# Patient Record
Sex: Female | Born: 1955
Health system: Southern US, Community
[De-identification: ages and names within clinical notes are randomized; demographics above are authoritative.]

## PROBLEM LIST (undated history)

## (undated) DIAGNOSIS — C801 Malignant (primary) neoplasm, unspecified: Secondary | ICD-10-CM

## (undated) DIAGNOSIS — R600 Localized edema: Secondary | ICD-10-CM

## (undated) DIAGNOSIS — R51 Headache: Secondary | ICD-10-CM

## (undated) DIAGNOSIS — K635 Polyp of colon: Secondary | ICD-10-CM

## (undated) DIAGNOSIS — G2401 Drug induced subacute dyskinesia: Secondary | ICD-10-CM

## (undated) DIAGNOSIS — F419 Anxiety disorder, unspecified: Secondary | ICD-10-CM

## (undated) DIAGNOSIS — E785 Hyperlipidemia, unspecified: Secondary | ICD-10-CM

## (undated) DIAGNOSIS — T7840XA Allergy, unspecified, initial encounter: Secondary | ICD-10-CM

## (undated) DIAGNOSIS — M1990 Unspecified osteoarthritis, unspecified site: Secondary | ICD-10-CM

## (undated) DIAGNOSIS — K219 Gastro-esophageal reflux disease without esophagitis: Secondary | ICD-10-CM

## (undated) DIAGNOSIS — E669 Obesity, unspecified: Secondary | ICD-10-CM

## (undated) DIAGNOSIS — M810 Age-related osteoporosis without current pathological fracture: Secondary | ICD-10-CM

## (undated) DIAGNOSIS — R1310 Dysphagia, unspecified: Secondary | ICD-10-CM

## (undated) DIAGNOSIS — D649 Anemia, unspecified: Secondary | ICD-10-CM

## (undated) DIAGNOSIS — M2550 Pain in unspecified joint: Secondary | ICD-10-CM

## (undated) DIAGNOSIS — R040 Epistaxis: Secondary | ICD-10-CM

## (undated) DIAGNOSIS — G4730 Sleep apnea, unspecified: Secondary | ICD-10-CM

## (undated) DIAGNOSIS — F32A Depression, unspecified: Secondary | ICD-10-CM

## (undated) DIAGNOSIS — F988 Other specified behavioral and emotional disorders with onset usually occurring in childhood and adolescence: Secondary | ICD-10-CM

## (undated) DIAGNOSIS — N3010 Interstitial cystitis (chronic) without hematuria: Secondary | ICD-10-CM

## (undated) DIAGNOSIS — E739 Lactose intolerance, unspecified: Secondary | ICD-10-CM

## (undated) DIAGNOSIS — K222 Esophageal obstruction: Secondary | ICD-10-CM

## (undated) DIAGNOSIS — C3490 Malignant neoplasm of unspecified part of unspecified bronchus or lung: Secondary | ICD-10-CM

## (undated) DIAGNOSIS — G709 Myoneural disorder, unspecified: Secondary | ICD-10-CM

## (undated) DIAGNOSIS — E119 Type 2 diabetes mellitus without complications: Secondary | ICD-10-CM

## (undated) DIAGNOSIS — R002 Palpitations: Secondary | ICD-10-CM

## (undated) DIAGNOSIS — F319 Bipolar disorder, unspecified: Secondary | ICD-10-CM

## (undated) DIAGNOSIS — F329 Major depressive disorder, single episode, unspecified: Secondary | ICD-10-CM

## (undated) HISTORY — DX: Obesity, unspecified: E66.9

## (undated) HISTORY — DX: Localized edema: R60.0

## (undated) HISTORY — DX: Age-related osteoporosis without current pathological fracture: M81.0

## (undated) HISTORY — DX: Unspecified osteoarthritis, unspecified site: M19.90

## (undated) HISTORY — PX: TOTAL HIP ARTHROPLASTY: SHX124

## (undated) HISTORY — PX: OTHER SURGICAL HISTORY: SHX169

## (undated) HISTORY — DX: Esophageal obstruction: K22.2

## (undated) HISTORY — DX: Palpitations: R00.2

## (undated) HISTORY — DX: Anxiety disorder, unspecified: F41.9

## (undated) HISTORY — DX: Drug induced subacute dyskinesia: G24.01

## (undated) HISTORY — PX: COLONOSCOPY: SHX174

## (undated) HISTORY — DX: Pain in unspecified joint: M25.50

## (undated) HISTORY — DX: Myoneural disorder, unspecified: G70.9

## (undated) HISTORY — DX: Dysphagia, unspecified: R13.10

## (undated) HISTORY — DX: Other specified behavioral and emotional disorders with onset usually occurring in childhood and adolescence: F98.8

## (undated) HISTORY — DX: Interstitial cystitis (chronic) without hematuria: N30.10

## (undated) HISTORY — DX: Allergy, unspecified, initial encounter: T78.40XA

## (undated) HISTORY — PX: JOINT REPLACEMENT: SHX530

## (undated) HISTORY — PX: TUBAL LIGATION: SHX77

## (undated) HISTORY — DX: Lactose intolerance, unspecified: E73.9

## (undated) HISTORY — DX: Type 2 diabetes mellitus without complications: E11.9

---

## 1986-09-05 HISTORY — PX: OTHER SURGICAL HISTORY: SHX169

## 1988-09-05 HISTORY — PX: TUBAL LIGATION: SHX77

## 1998-01-28 ENCOUNTER — Ambulatory Visit: Admission: RE | Admit: 1998-01-28 | Discharge: 1998-01-28 | Payer: Self-pay | Admitting: Family Medicine

## 1998-08-26 ENCOUNTER — Encounter: Payer: Self-pay | Admitting: Gynecology

## 1998-08-26 ENCOUNTER — Ambulatory Visit (HOSPITAL_COMMUNITY): Admission: RE | Admit: 1998-08-26 | Discharge: 1998-08-26 | Payer: Self-pay | Admitting: Gynecology

## 1999-07-08 ENCOUNTER — Encounter: Payer: Self-pay | Admitting: Family Medicine

## 1999-07-08 ENCOUNTER — Ambulatory Visit (HOSPITAL_COMMUNITY): Admission: RE | Admit: 1999-07-08 | Discharge: 1999-07-08 | Payer: Self-pay | Admitting: Family Medicine

## 2000-07-04 ENCOUNTER — Emergency Department (HOSPITAL_COMMUNITY): Admission: EM | Admit: 2000-07-04 | Discharge: 2000-07-04 | Payer: Self-pay | Admitting: Emergency Medicine

## 2000-09-05 DIAGNOSIS — C3490 Malignant neoplasm of unspecified part of unspecified bronchus or lung: Secondary | ICD-10-CM

## 2000-09-05 HISTORY — DX: Malignant neoplasm of unspecified part of unspecified bronchus or lung: C34.90

## 2000-09-05 HISTORY — PX: WEDGE RESECTION: SHX5070

## 2000-09-12 ENCOUNTER — Other Ambulatory Visit: Admission: RE | Admit: 2000-09-12 | Discharge: 2000-09-12 | Payer: Self-pay | Admitting: Gynecology

## 2000-09-25 ENCOUNTER — Ambulatory Visit (HOSPITAL_COMMUNITY): Admission: RE | Admit: 2000-09-25 | Discharge: 2000-09-25 | Payer: Self-pay | Admitting: Gynecology

## 2000-09-25 ENCOUNTER — Encounter: Payer: Self-pay | Admitting: Gynecology

## 2001-05-09 ENCOUNTER — Encounter (INDEPENDENT_AMBULATORY_CARE_PROVIDER_SITE_OTHER): Payer: Self-pay | Admitting: *Deleted

## 2001-05-09 ENCOUNTER — Encounter: Admission: RE | Admit: 2001-05-09 | Discharge: 2001-05-09 | Payer: Self-pay | Admitting: *Deleted

## 2001-05-09 ENCOUNTER — Encounter: Payer: Self-pay | Admitting: *Deleted

## 2001-05-16 ENCOUNTER — Encounter (INDEPENDENT_AMBULATORY_CARE_PROVIDER_SITE_OTHER): Payer: Self-pay | Admitting: *Deleted

## 2001-05-16 ENCOUNTER — Encounter: Admission: RE | Admit: 2001-05-16 | Discharge: 2001-05-16 | Payer: Self-pay | Admitting: *Deleted

## 2001-05-16 ENCOUNTER — Encounter: Payer: Self-pay | Admitting: *Deleted

## 2001-05-22 ENCOUNTER — Ambulatory Visit (HOSPITAL_COMMUNITY): Admission: RE | Admit: 2001-05-22 | Discharge: 2001-05-22 | Payer: Self-pay | Admitting: *Deleted

## 2001-05-22 ENCOUNTER — Encounter: Payer: Self-pay | Admitting: *Deleted

## 2001-06-11 ENCOUNTER — Encounter (INDEPENDENT_AMBULATORY_CARE_PROVIDER_SITE_OTHER): Payer: Self-pay | Admitting: *Deleted

## 2001-06-11 ENCOUNTER — Encounter: Payer: Self-pay | Admitting: Thoracic Surgery

## 2001-06-11 ENCOUNTER — Inpatient Hospital Stay (HOSPITAL_COMMUNITY): Admission: RE | Admit: 2001-06-11 | Discharge: 2001-06-19 | Payer: Self-pay | Admitting: Thoracic Surgery

## 2001-06-12 ENCOUNTER — Encounter: Payer: Self-pay | Admitting: Thoracic Surgery

## 2001-06-13 ENCOUNTER — Encounter: Payer: Self-pay | Admitting: Thoracic Surgery

## 2001-06-14 ENCOUNTER — Encounter: Payer: Self-pay | Admitting: Thoracic Surgery

## 2001-06-15 ENCOUNTER — Encounter: Payer: Self-pay | Admitting: Thoracic Surgery

## 2001-06-16 ENCOUNTER — Encounter: Payer: Self-pay | Admitting: Thoracic Surgery

## 2001-06-18 ENCOUNTER — Encounter: Payer: Self-pay | Admitting: Thoracic Surgery

## 2001-06-22 ENCOUNTER — Encounter: Payer: Self-pay | Admitting: Thoracic Surgery

## 2001-06-22 ENCOUNTER — Encounter: Admission: RE | Admit: 2001-06-22 | Discharge: 2001-06-22 | Payer: Self-pay | Admitting: Thoracic Surgery

## 2001-07-04 ENCOUNTER — Encounter: Payer: Self-pay | Admitting: Thoracic Surgery

## 2001-07-04 ENCOUNTER — Encounter: Admission: RE | Admit: 2001-07-04 | Discharge: 2001-07-04 | Payer: Self-pay | Admitting: Thoracic Surgery

## 2001-07-25 ENCOUNTER — Encounter: Admission: RE | Admit: 2001-07-25 | Discharge: 2001-07-25 | Payer: Self-pay | Admitting: Thoracic Surgery

## 2001-07-25 ENCOUNTER — Encounter: Payer: Self-pay | Admitting: Thoracic Surgery

## 2001-08-06 ENCOUNTER — Encounter: Payer: Self-pay | Admitting: Internal Medicine

## 2001-08-16 ENCOUNTER — Ambulatory Visit (HOSPITAL_COMMUNITY): Admission: RE | Admit: 2001-08-16 | Discharge: 2001-08-16 | Payer: Self-pay | Admitting: Internal Medicine

## 2001-08-16 ENCOUNTER — Encounter: Payer: Self-pay | Admitting: Internal Medicine

## 2001-08-16 ENCOUNTER — Encounter (INDEPENDENT_AMBULATORY_CARE_PROVIDER_SITE_OTHER): Payer: Self-pay | Admitting: *Deleted

## 2001-08-22 ENCOUNTER — Encounter: Payer: Self-pay | Admitting: Thoracic Surgery

## 2001-08-22 ENCOUNTER — Encounter: Admission: RE | Admit: 2001-08-22 | Discharge: 2001-08-22 | Payer: Self-pay | Admitting: Thoracic Surgery

## 2001-09-21 ENCOUNTER — Encounter: Admission: RE | Admit: 2001-09-21 | Discharge: 2001-12-20 | Payer: Self-pay | Admitting: *Deleted

## 2001-09-26 ENCOUNTER — Ambulatory Visit (HOSPITAL_COMMUNITY): Admission: RE | Admit: 2001-09-26 | Discharge: 2001-09-26 | Payer: Self-pay | Admitting: *Deleted

## 2001-09-26 ENCOUNTER — Encounter: Payer: Self-pay | Admitting: *Deleted

## 2001-10-19 ENCOUNTER — Encounter: Payer: Self-pay | Admitting: Thoracic Surgery

## 2001-10-19 ENCOUNTER — Other Ambulatory Visit: Admission: RE | Admit: 2001-10-19 | Discharge: 2001-10-19 | Payer: Self-pay | Admitting: Gynecology

## 2001-10-19 ENCOUNTER — Encounter: Admission: RE | Admit: 2001-10-19 | Discharge: 2001-10-19 | Payer: Self-pay | Admitting: Thoracic Surgery

## 2001-12-31 ENCOUNTER — Encounter: Admission: RE | Admit: 2001-12-31 | Discharge: 2002-03-31 | Payer: Self-pay | Admitting: *Deleted

## 2002-01-22 ENCOUNTER — Encounter (INDEPENDENT_AMBULATORY_CARE_PROVIDER_SITE_OTHER): Payer: Self-pay | Admitting: *Deleted

## 2002-01-22 ENCOUNTER — Encounter: Payer: Self-pay | Admitting: Thoracic Surgery

## 2002-01-22 ENCOUNTER — Encounter: Admission: RE | Admit: 2002-01-22 | Discharge: 2002-01-22 | Payer: Self-pay | Admitting: Thoracic Surgery

## 2002-02-11 ENCOUNTER — Encounter: Payer: Self-pay | Admitting: Oncology

## 2002-02-11 ENCOUNTER — Encounter (INDEPENDENT_AMBULATORY_CARE_PROVIDER_SITE_OTHER): Payer: Self-pay | Admitting: *Deleted

## 2002-02-11 ENCOUNTER — Encounter: Admission: RE | Admit: 2002-02-11 | Discharge: 2002-02-11 | Payer: Self-pay | Admitting: Oncology

## 2002-04-17 ENCOUNTER — Encounter: Payer: Self-pay | Admitting: Thoracic Surgery

## 2002-04-17 ENCOUNTER — Encounter: Admission: RE | Admit: 2002-04-17 | Discharge: 2002-04-17 | Payer: Self-pay | Admitting: Thoracic Surgery

## 2002-05-16 ENCOUNTER — Encounter: Admission: RE | Admit: 2002-05-16 | Discharge: 2002-06-18 | Payer: Self-pay | Admitting: *Deleted

## 2002-06-17 ENCOUNTER — Encounter: Payer: Self-pay | Admitting: Internal Medicine

## 2002-07-11 ENCOUNTER — Encounter: Admission: RE | Admit: 2002-07-11 | Discharge: 2002-10-09 | Payer: Self-pay | Admitting: *Deleted

## 2002-07-23 ENCOUNTER — Encounter: Payer: Self-pay | Admitting: Thoracic Surgery

## 2002-07-23 ENCOUNTER — Encounter (INDEPENDENT_AMBULATORY_CARE_PROVIDER_SITE_OTHER): Payer: Self-pay | Admitting: *Deleted

## 2002-07-23 ENCOUNTER — Encounter: Admission: RE | Admit: 2002-07-23 | Discharge: 2002-07-23 | Payer: Self-pay | Admitting: Thoracic Surgery

## 2002-09-27 ENCOUNTER — Ambulatory Visit (HOSPITAL_COMMUNITY): Admission: RE | Admit: 2002-09-27 | Discharge: 2002-09-27 | Payer: Self-pay | Admitting: Gynecology

## 2002-09-27 ENCOUNTER — Encounter: Payer: Self-pay | Admitting: Gynecology

## 2002-11-08 ENCOUNTER — Other Ambulatory Visit: Admission: RE | Admit: 2002-11-08 | Discharge: 2002-11-08 | Payer: Self-pay | Admitting: Gynecology

## 2002-11-14 ENCOUNTER — Encounter: Admission: RE | Admit: 2002-11-14 | Discharge: 2003-02-12 | Payer: Self-pay | Admitting: *Deleted

## 2003-01-21 ENCOUNTER — Encounter: Payer: Self-pay | Admitting: Thoracic Surgery

## 2003-01-21 ENCOUNTER — Encounter: Admission: RE | Admit: 2003-01-21 | Discharge: 2003-01-21 | Payer: Self-pay | Admitting: Thoracic Surgery

## 2003-01-21 ENCOUNTER — Encounter (INDEPENDENT_AMBULATORY_CARE_PROVIDER_SITE_OTHER): Payer: Self-pay | Admitting: *Deleted

## 2003-07-24 ENCOUNTER — Encounter: Admission: RE | Admit: 2003-07-24 | Discharge: 2003-07-24 | Payer: Self-pay | Admitting: Thoracic Surgery

## 2003-08-25 ENCOUNTER — Encounter: Admission: RE | Admit: 2003-08-25 | Discharge: 2003-08-25 | Payer: Self-pay | Admitting: Family Medicine

## 2003-09-23 ENCOUNTER — Encounter: Payer: Self-pay | Admitting: Internal Medicine

## 2003-09-30 ENCOUNTER — Ambulatory Visit (HOSPITAL_COMMUNITY): Admission: RE | Admit: 2003-09-30 | Discharge: 2003-09-30 | Payer: Self-pay | Admitting: Oncology

## 2003-11-10 ENCOUNTER — Other Ambulatory Visit: Admission: RE | Admit: 2003-11-10 | Discharge: 2003-11-10 | Payer: Self-pay | Admitting: Gynecology

## 2003-11-20 ENCOUNTER — Encounter: Admission: RE | Admit: 2003-11-20 | Discharge: 2003-11-20 | Payer: Self-pay | Admitting: Oncology

## 2003-12-25 ENCOUNTER — Encounter: Admission: RE | Admit: 2003-12-25 | Discharge: 2003-12-25 | Payer: Self-pay | Admitting: Family Medicine

## 2004-04-01 ENCOUNTER — Ambulatory Visit (HOSPITAL_COMMUNITY): Admission: RE | Admit: 2004-04-01 | Discharge: 2004-04-01 | Payer: Self-pay | Admitting: Oncology

## 2004-04-28 ENCOUNTER — Encounter (INDEPENDENT_AMBULATORY_CARE_PROVIDER_SITE_OTHER): Payer: Self-pay | Admitting: *Deleted

## 2004-04-28 ENCOUNTER — Encounter: Admission: RE | Admit: 2004-04-28 | Discharge: 2004-04-28 | Payer: Self-pay | Admitting: Thoracic Surgery

## 2004-07-22 ENCOUNTER — Ambulatory Visit: Payer: Self-pay | Admitting: Oncology

## 2004-08-26 ENCOUNTER — Ambulatory Visit: Payer: Self-pay | Admitting: Internal Medicine

## 2004-09-01 ENCOUNTER — Encounter: Admission: RE | Admit: 2004-09-01 | Discharge: 2004-09-01 | Payer: Self-pay | Admitting: Oncology

## 2004-09-01 ENCOUNTER — Encounter (INDEPENDENT_AMBULATORY_CARE_PROVIDER_SITE_OTHER): Payer: Self-pay | Admitting: *Deleted

## 2004-12-03 ENCOUNTER — Encounter: Admission: RE | Admit: 2004-12-03 | Discharge: 2004-12-03 | Payer: Self-pay | Admitting: Family Medicine

## 2005-02-17 ENCOUNTER — Ambulatory Visit: Payer: Self-pay | Admitting: Orthopedic Surgery

## 2005-02-18 ENCOUNTER — Ambulatory Visit: Payer: Self-pay | Admitting: Oncology

## 2005-02-22 ENCOUNTER — Ambulatory Visit (HOSPITAL_COMMUNITY): Admission: RE | Admit: 2005-02-22 | Discharge: 2005-02-22 | Payer: Self-pay | Admitting: Orthopedic Surgery

## 2005-02-28 ENCOUNTER — Ambulatory Visit (HOSPITAL_COMMUNITY): Admission: RE | Admit: 2005-02-28 | Discharge: 2005-02-28 | Payer: Self-pay | Admitting: Gynecology

## 2005-03-02 ENCOUNTER — Ambulatory Visit: Payer: Self-pay | Admitting: Orthopedic Surgery

## 2005-03-09 ENCOUNTER — Ambulatory Visit (HOSPITAL_COMMUNITY): Admission: RE | Admit: 2005-03-09 | Discharge: 2005-03-09 | Payer: Self-pay | Admitting: Orthopedic Surgery

## 2005-03-10 ENCOUNTER — Other Ambulatory Visit: Admission: RE | Admit: 2005-03-10 | Discharge: 2005-03-10 | Payer: Self-pay | Admitting: Gynecology

## 2005-03-14 ENCOUNTER — Ambulatory Visit: Payer: Self-pay | Admitting: Orthopedic Surgery

## 2005-03-17 ENCOUNTER — Ambulatory Visit (HOSPITAL_COMMUNITY): Admission: RE | Admit: 2005-03-17 | Discharge: 2005-03-17 | Payer: Self-pay | Admitting: Orthopaedic Surgery

## 2005-03-23 ENCOUNTER — Ambulatory Visit: Payer: Self-pay | Admitting: Orthopedic Surgery

## 2005-04-01 ENCOUNTER — Encounter: Admission: RE | Admit: 2005-04-01 | Discharge: 2005-04-01 | Payer: Self-pay | Admitting: Orthopedic Surgery

## 2005-04-14 ENCOUNTER — Encounter: Admission: RE | Admit: 2005-04-14 | Discharge: 2005-04-14 | Payer: Self-pay | Admitting: Orthopedic Surgery

## 2005-05-25 ENCOUNTER — Encounter: Admission: RE | Admit: 2005-05-25 | Discharge: 2005-05-25 | Payer: Self-pay | Admitting: Thoracic Surgery

## 2005-06-15 ENCOUNTER — Inpatient Hospital Stay (HOSPITAL_COMMUNITY): Admission: RE | Admit: 2005-06-15 | Discharge: 2005-06-20 | Payer: Self-pay | Admitting: Orthopedic Surgery

## 2005-06-15 ENCOUNTER — Ambulatory Visit: Payer: Self-pay | Admitting: Physical Medicine & Rehabilitation

## 2005-07-30 ENCOUNTER — Encounter (INDEPENDENT_AMBULATORY_CARE_PROVIDER_SITE_OTHER): Payer: Self-pay | Admitting: *Deleted

## 2005-08-25 ENCOUNTER — Ambulatory Visit: Payer: Self-pay | Admitting: Oncology

## 2005-10-28 ENCOUNTER — Encounter: Admission: RE | Admit: 2005-10-28 | Discharge: 2005-10-28 | Payer: Self-pay | Admitting: Orthopedic Surgery

## 2005-11-23 ENCOUNTER — Encounter (INDEPENDENT_AMBULATORY_CARE_PROVIDER_SITE_OTHER): Payer: Self-pay | Admitting: *Deleted

## 2005-11-23 ENCOUNTER — Encounter: Admission: RE | Admit: 2005-11-23 | Discharge: 2005-11-23 | Payer: Self-pay | Admitting: Thoracic Surgery

## 2006-03-13 ENCOUNTER — Ambulatory Visit (HOSPITAL_COMMUNITY): Admission: RE | Admit: 2006-03-13 | Discharge: 2006-03-13 | Payer: Self-pay | Admitting: Gynecology

## 2006-03-17 ENCOUNTER — Encounter: Admission: RE | Admit: 2006-03-17 | Discharge: 2006-03-17 | Payer: Self-pay | Admitting: Orthopedic Surgery

## 2006-03-20 ENCOUNTER — Other Ambulatory Visit: Admission: RE | Admit: 2006-03-20 | Discharge: 2006-03-20 | Payer: Self-pay | Admitting: Gynecology

## 2006-07-17 ENCOUNTER — Inpatient Hospital Stay (HOSPITAL_COMMUNITY): Admission: RE | Admit: 2006-07-17 | Discharge: 2006-07-20 | Payer: Self-pay | Admitting: Orthopedic Surgery

## 2006-07-20 ENCOUNTER — Encounter (INDEPENDENT_AMBULATORY_CARE_PROVIDER_SITE_OTHER): Payer: Self-pay | Admitting: *Deleted

## 2007-09-24 ENCOUNTER — Ambulatory Visit (HOSPITAL_COMMUNITY): Admission: RE | Admit: 2007-09-24 | Discharge: 2007-09-24 | Payer: Self-pay | Admitting: Oncology

## 2007-09-24 ENCOUNTER — Ambulatory Visit: Payer: Self-pay | Admitting: Internal Medicine

## 2007-11-06 ENCOUNTER — Other Ambulatory Visit: Admission: RE | Admit: 2007-11-06 | Discharge: 2007-11-06 | Payer: Self-pay | Admitting: Obstetrics and Gynecology

## 2007-12-19 ENCOUNTER — Ambulatory Visit: Payer: Self-pay | Admitting: Internal Medicine

## 2007-12-28 ENCOUNTER — Ambulatory Visit: Payer: Self-pay | Admitting: Internal Medicine

## 2008-11-05 ENCOUNTER — Ambulatory Visit (HOSPITAL_COMMUNITY): Admission: RE | Admit: 2008-11-05 | Discharge: 2008-11-05 | Payer: Self-pay | Admitting: Obstetrics and Gynecology

## 2009-08-26 ENCOUNTER — Other Ambulatory Visit: Admission: RE | Admit: 2009-08-26 | Discharge: 2009-08-26 | Payer: Self-pay | Admitting: Obstetrics and Gynecology

## 2009-09-05 HISTORY — PX: BUNIONECTOMY: SHX129

## 2010-05-26 ENCOUNTER — Ambulatory Visit (HOSPITAL_COMMUNITY): Admission: RE | Admit: 2010-05-26 | Discharge: 2010-05-26 | Payer: Self-pay | Admitting: Orthopedic Surgery

## 2010-07-06 ENCOUNTER — Ambulatory Visit (HOSPITAL_COMMUNITY): Admission: RE | Admit: 2010-07-06 | Discharge: 2010-07-06 | Payer: Self-pay | Admitting: Internal Medicine

## 2010-07-22 ENCOUNTER — Encounter: Payer: Self-pay | Admitting: Internal Medicine

## 2010-08-23 ENCOUNTER — Emergency Department (HOSPITAL_COMMUNITY)
Admission: EM | Admit: 2010-08-23 | Discharge: 2010-08-23 | Payer: Self-pay | Source: Home / Self Care | Admitting: Emergency Medicine

## 2010-08-26 ENCOUNTER — Other Ambulatory Visit
Admission: RE | Admit: 2010-08-26 | Discharge: 2010-08-26 | Payer: Self-pay | Source: Home / Self Care | Admitting: Obstetrics and Gynecology

## 2010-09-14 ENCOUNTER — Ambulatory Visit
Admission: RE | Admit: 2010-09-14 | Discharge: 2010-09-14 | Payer: Self-pay | Source: Home / Self Care | Attending: Internal Medicine | Admitting: Internal Medicine

## 2010-09-14 ENCOUNTER — Encounter: Payer: Self-pay | Admitting: Internal Medicine

## 2010-09-14 DIAGNOSIS — K219 Gastro-esophageal reflux disease without esophagitis: Secondary | ICD-10-CM | POA: Insufficient documentation

## 2010-09-14 DIAGNOSIS — D509 Iron deficiency anemia, unspecified: Secondary | ICD-10-CM | POA: Insufficient documentation

## 2010-09-14 DIAGNOSIS — Z860100 Personal history of colon polyps, unspecified: Secondary | ICD-10-CM | POA: Insufficient documentation

## 2010-09-25 ENCOUNTER — Encounter: Payer: Self-pay | Admitting: Internal Medicine

## 2010-09-25 ENCOUNTER — Encounter: Payer: Self-pay | Admitting: Oncology

## 2010-09-26 ENCOUNTER — Encounter: Payer: Self-pay | Admitting: Thoracic Surgery

## 2010-09-26 ENCOUNTER — Encounter: Payer: Self-pay | Admitting: Orthopedic Surgery

## 2010-09-26 ENCOUNTER — Encounter: Payer: Self-pay | Admitting: Family Medicine

## 2010-10-04 ENCOUNTER — Telehealth: Payer: Self-pay | Admitting: Internal Medicine

## 2010-10-05 ENCOUNTER — Other Ambulatory Visit: Payer: Self-pay | Admitting: Internal Medicine

## 2010-10-05 ENCOUNTER — Ambulatory Visit
Admission: RE | Admit: 2010-10-05 | Discharge: 2010-10-05 | Payer: Self-pay | Source: Home / Self Care | Attending: Internal Medicine | Admitting: Internal Medicine

## 2010-10-05 NOTE — Letter (Signed)
Summary: New Patient letter  [facility]  [address] [place], [place] [address]   Phone: [phone]  Fax: [phone]       07/22/2010 MRN: [id number]  Megan Whitney [address] [place], [place]  [address]  Dear Ms. Megan Whitney,  Welcome to the Gastroenterology Division at [facility].    You are scheduled to see Dr.  Marina Goodell on 09-14-10 at 9:15am on the 3rd floor at [facility], [address]  We ask that you try to arrive at our office 15 minutes prior to your appointment time to allow for check-in.  We would like you to complete the enclosed self-administered evaluation form prior to your visit and bring it with you on the day of your appointment.  We will review it with you.  Also, please bring a complete list of all your medications or, if you prefer, bring the medication bottles and we will list them.  Please bring your insurance card so that we may make a copy of it.  If your insurance requires a referral to see a specialist, please bring your referral form from your primary care physician.  Co-payments are due at the time of your visit and may be paid by cash, check or credit card.     Your office visit will consist of a consult with your physician (includes a physical exam), any laboratory testing he/she may order, scheduling of any necessary diagnostic testing (e.g. x-ray, ultrasound, CT-scan), and scheduling of a procedure (e.g. Endoscopy, Colonoscopy) if required.  Please allow enough time on your schedule to allow for any/all of these possibilities.    If you cannot keep your appointment, please call [phone] to cancel or reschedule prior to your appointment date.  This allows [place] the opportunity to schedule an appointment for another patient in need of care.  If you do not cancel or reschedule by 5 p.m. the business day prior to your appointment date, you will be charged a $50.00 late cancellation/no-show fee.    Thank you for choosing  [facility] for your medical needs.  We appreciate the opportunity to care for you.  Please visit [place] at our website  to learn more about our practice.                     Sincerely,                                                             The Gastroenterology Division

## 2010-10-07 NOTE — Discharge Summary (Signed)
Summary: Discharge Summary   NAME:  Megan Whitney, Megan Whitney              ACCOUNT NO.:  [id number]   MEDICAL RECORD NO.:  [id number]          PATIENT TYPE:  INP   LOCATION:  1621                         FACILITY:  [facility]   PHYSICIAN:  Ollen Gross, M.D.    DATE OF BIRTH:  19-Jul-1956   DATE OF ADMISSION:  06/15/2005  DATE OF DISCHARGE:  06/20/2005                                 DISCHARGE SUMMARY   ADMITTING DIAGNOSES:  1.  Osteoarthritis left hip.  2.  History of migraines.  3.  Bipolar disorder with history of depression.  4.  Hiatal hernia.  5.  Reflux disease.  6.  History of urinary tract infection.  7.  History of cystitis.  8.  Urinary incontinence.  9.  Past history of hypotensive episode from previous lung surgery.  10. History of lung carcinoma.  11. Degenerative disk disease.  12. Fibromyalgia.   DISCHARGE DIAGNOSES:  1.  Osteoarthritis left hip status post left total hip arthroplasty.  2.  Mild postoperative hyponatremia, improving.  3.  History of migraines.  4.  Bipolar disorder with history of depression.  5.  Hiatal hernia.  6.  Reflux disease.  7.  History of urinary tract infection.  8.  History of cystitis.  9.  Urinary incontinence.  10. Past history of hypotensive episode from previous lung surgery.  11. History of lung carcinoma.  12. Degenerative disk disease.  13. Fibromyalgia.   PROCEDURE:  June 15, 2005 left total hip.   SURGEON:  Dr. Lequita Halt.   ASSISTANT:  Perkins, P.A.-C.   ANESTHESIA:  General.   ESTIMATED BLOOD LOSS:  200 mL.   DRAINS:  Hemovac drains x1.   CONSULTATIONS:  Rehab Services.   BRIEF HISTORY:  Megan Whitney is a 55 year old female with end-stage arthritis to  the left hip with some dysplasia.  She has bone-on-bone changes with  intractable pain.  Due to significant findings, she was admitted for total  hip.   LABORATORY DATA:  Preop CBC hemoglobin 14.3, hematocrit 41.7, differential  within normal limits.  Postop hemoglobin 11.8,  last known H&H 11.6 and 33.4,  PT/PTT preop12.3 and 26 respectively.  Serial pro time followup last known  PT/INR 20.6 and 1.7.  Chem panel on admission all within normal limits.  Serial BMETs are followed  -- sodium dropped from 140 to 131, back up to  132, glucose went up from 92 to 144.  Preoperative UA -- negative.  Blood  group type A positive.  Preop EKG June 06, 2005 normal sinus rhythm,  normal EKG, confirmed by Dr. Molly Maduro Severe.  Left hip film June 06, 2005 -  - advanced degenerative changes involving the left hip.  Hip and pelvis film  on June 15, 2005 -- left total hip arthroplasty without complications.   HOSPITAL COURSE:  The patient was admitted to [facility] and  tolerated the procedure well.  Later transferred from the recovery room to  the orthopedic floor.  Started on PC and p.r.n. analgesics.  Rehab consult  was ordered postop.  It was felt by rehab that possibly could progress well,  but they would monitor her postoperatively, Hemovac drain was pulled on day  one.  Day two she had a little bit of discomfort with transfers, but getting  better.  She was placed on 25-50% partial weight-bearing.  Pulse was  elevated postop and felt to be due to pain, but if it continued to be  elevated, would work it up with EKG.  She was doing a little bit better by  the following day with her pain control.  By day three she was progressing a  little better with her physical therapy, up ambulating approximately 60  feet.  By day four she was progressing well with her therapy and wanted to  go home.  Discharge Planning started making home arrangements.  By June 20, 2005 she was doing well, dressings had been changed daily starting on  day two, incision looked good, no signs of infection, tolerating her meds  and was discharged home on June 20, 2005.   DISCHARGE PLAN:  The patient discharged home on June 20, 2005.   DISCHARGE DIAGNOSES:  Please see above.    DISCHARGE MEDICATIONS:  1.  Vicodin.  2.  Coumadin.  3.  Robaxin.   DIET:  Resume previous home diet.   ACTIVITY:  Partial weight-bearing 25-50% left lower extremity.  Gait  training ambulation, ADLs as per home health, physical therapy, home health  nursing.  Hip precautions -- total hip protocol.   FOLLOWUP:  Follow-up two weeks from surgery.   DISPOSITION:  Home.   CONDITION ON DISCHARGE:  Improved.      Alexzandrew L. Julien Girt, P.A.      Ollen Gross, M.D.  Electronically Signed    ALP/MEDQ  D:  07/30/2005  T:  07/30/2005  Job:  [id number]   cc:   Ollen Gross, M.D.  Fax: [phone]   Ellwood Dense, M.D.  Fax: [phone]

## 2010-10-07 NOTE — Progress Notes (Signed)
Summary: [facility]   Imported By: Sherian Rein 09/15/2010 10:50:37  _____________________________________________________________________  External Attachment:    Type:   Image     Comment:   External Document

## 2010-10-07 NOTE — Letter (Signed)
Summary: [facility] Instructions  [facility]  [address] [place], [place] [address]   Phone: [phone]  Fax: [phone]       Megan Whitney    1955-10-03    MRN: [id number]        Procedure Day /Date:TUESDAY, 10/05/10     Arrival Time:1:00 PM      Procedure Time:2:00 PM     Location of Procedure:                    X  [facility] (4th Floor)   PREPARATION FOR COLONOSCOPY WITH MOVIPREP/ENDO   Starting 5 days prior to your procedure 09/30/10 do not eat nuts, seeds, popcorn, corn, beans, peas,  salads, or any raw vegetables.  Do not take any fiber supplements (e.g. Metamucil, Citrucel, and Benefiber).  THE DAY BEFORE YOUR PROCEDURE         DATE: 10/04/10  DAY: MONDAY  1.  Drink clear liquids the entire day-NO SOLID FOOD  2.  Do not drink anything colored red or purple.  Avoid juices with pulp.  No orange juice.  3.  Drink at least 64 oz. (8 glasses) of fluid/clear liquids during the day to prevent dehydration and help the prep work efficiently.  CLEAR LIQUIDS INCLUDE: Water Jello Ice Popsicles Tea (sugar ok, no milk/cream) Powdered fruit flavored drinks Coffee (sugar ok, no milk/cream) Gatorade Juice: apple, white grape, white cranberry  Lemonade Clear bullion, consomm, broth Carbonated beverages (any kind) Strained chicken noodle soup Hard Candy                             4.  In the morning, mix first dose of MoviPrep solution:    Empty 1 Pouch A and 1 Pouch B into the disposable container    Add lukewarm drinking water to the top line of the container. Mix to dissolve    Refrigerate (mixed solution should be used within 24 hrs)  5.  Begin drinking the prep at 5:00 p.m. The MoviPrep container is divided by 4 marks.   Every 15 minutes drink the solution down to the next mark (approximately 8 oz) until the full liter is complete.   6.  Follow completed prep with 16 oz of clear liquid of your choice (Nothing red or purple).   Continue to drink clear liquids until bedtime.  7.  Before going to bed, mix second dose of MoviPrep solution:    Empty 1 Pouch A and 1 Pouch B into the disposable container    Add lukewarm drinking water to the top line of the container. Mix to dissolve    Refrigerate  THE DAY OF YOUR PROCEDURE      DATE:10/05/10 NUU:VOZDGUY  Beginning at 9:00 a.m. (5 hours before procedure):         1. Every 15 minutes, drink the solution down to the next mark (approx 8 oz) until the full liter is complete.  2. Follow completed prep with 16 oz. of clear liquid of your choice.    3. You may drink clear liquids until 12 NOON (2 HOURS BEFORE PROCEDURE).   MEDICATION INSTRUCTIONS  Unless otherwise instructed, you should take regular prescription medications with a small sip of water   as early as possible the morning of your procedure.  STOP YOUR IRON 7 DAYS PRIOR TO PROCEDURE/ 09/28/10       OTHER INSTRUCTIONS  You will need a responsible  adult at least 55 years of age to accompany you and drive you home.   This person must remain in the waiting room during your procedure.  Wear loose fitting clothing that is easily removed.  Leave jewelry and other valuables at home.  However, you may wish to bring a book to read or  an iPod/MP3 player to listen to music as you wait for your procedure to start.  Remove all body piercing jewelry and leave at home.  Total time from sign-in until discharge is approximately 2-3 hours.  You should go home directly after your procedure and rest.  You can resume normal activities the  day after your procedure.  The day of your procedure you should not:   Drive   Make legal decisions   Operate machinery   Drink alcohol   Return to work  You will receive specific instructions about eating, activities and medications before you leave.    The above instructions have been reviewed and explained to me by   _______________________    I fully  understand and can verbalize these instructions _____________________________ Date _________

## 2010-10-07 NOTE — Procedures (Signed)
Summary: EGD   EGD  Procedure date:  08/16/2001  Findings:      Location: [facility]  Findings: Stricture:  GERD  Patient Name: Megan Whitney, Megan Whitney. MRN: [id number] Procedure Procedures: Panendoscopy (EGD) CPT: 43235.  Personnel: Endoscopist: Wilhemina Bonito. Marina Goodell, MD.  Referred By: Mardelle Matte, MD.  Exam Location: Exam performed in Endoscopy Suite.  Patient Consent: Procedure, Alternatives, Risks and Benefits discussed, consent obtained,  Indications Symptoms: Dysphagia. Reflux symptoms  History  Pre-Exam Physical: Performed Aug 16, 2001  Cardio-pulmonary exam, HEENT exam, Abdominal exam, Extremity exam, Neurological exam, Mental status exam WNL.  Exam Exam Info: Maximum depth of insertion Duodenum, intended Duodenum. Patient position: on left side. Vocal cords visualized. Gastric retroflexion performed. Images taken. ASA Classification: III. Tolerance: excellent.  Sedation Meds: Residual sedation present from prior procedure today. Demerol 20 mg. Versed 2 mg.  Monitoring: BP and pulse monitoring done. Oximetry used. Supplemental O2 given  Fluoroscopy: Fluoroscopy was not used.  Findings STRICTURE / STENOSIS: Stricture in Distal Esophagus.  Constriction: partial. Etiology: benign due to reflux. 35 cm from mouth. ICD9: Esophageal Stricture: 530.3.  HIATAL HERNIA: Comments: small.    Comments: No neoplasia found. The ampulla was normal. Assessment Abnormal examination, see findings above.  Diagnoses: 530.3: Esophageal Stricture.  530.81: GERD.   Events  Unplanned Intervention: No unplanned interventions were required.  Unplanned Events: There were no complications. Plans Medication(s): PPI: Esomeprazole/Nexium 40 mg QD, starting Aug 16, 2001   Disposition: After procedure patient sent to recovery. After recovery patient sent home.  Comments: Call if dysphagia or reflux symptoms persist despite Nexium therapy.  This report was created from  the original endoscopy report, which was reviewed and signed by the above listed endoscopist.   cc:  Maurie Boettcher, MD      Heather Roberts, MD      Karle Plumber, MD

## 2010-10-07 NOTE — Progress Notes (Signed)
Summary: [facility]   Imported By: Sherian Rein 09/15/2010 10:51:30  _____________________________________________________________________  External Attachment:    Type:   Image     Comment:   External Document

## 2010-10-07 NOTE — Procedures (Signed)
Summary: colonoscopy   Colonoscopy  Procedure date:  08/16/2001  Findings:      Pathology:  Adenomatous polyp.        Pathology:  Hyperplastic polyp.     Location:  [facility].    Procedures Next Due Date:    Colonoscopy: 08/2006 Patient Name: Megan Whitney, Megan Whitney. MRN: [id number] Procedure Procedures: Colonoscopy CPT: [phone].    with Hot Biopsy(s)CPT: Z451292.    with polypectomy. CPT: A3573898.  Personnel: Endoscopist: Wilhemina Bonito. Marina Goodell, MD.  Referred By: Mardelle Matte, MD.  Exam Location: Exam performed in Endoscopy Suite.  Patient Consent: Procedure, Alternatives, Risks and Benefits discussed, consent obtained,  Indications  Evaluation of: Adeno Ca lung ? primary.  History Comments: Adeno Ca lung (? primary) s/p resection Pre-Exam Physical: Performed Aug 16, 2001. Cardio-pulmonary exam, Rectal exam, HEENT exam , Abdominal exam, Extremity exam, Neurological exam, Mental status exam WNL.  Exam Exam: Extent of exam reached: Ileum, extent intended: Ileum.  The cecum was identified by appendiceal orifice and IC valve. Patient position: on left side. Colon retroflexion performed. Images taken. ASA Classification: III. Tolerance: excellent.  Monitoring: Pulse and BP monitoring, Oximetry used. Supplemental O2 given.  Colon Prep Used Golytely for colon prep. Prep results: excellent.  Fluoroscopy: Fluoroscopy was not used.  Sedation Meds: Demerol 80 mg. Versed 8 mg.  Findings MULTIPLE POLYPS: Ascending Colon. minimum size 2 mm, maximum size 3 mm. Procedure:  hot biopsy, removed, Polyp retrieved, 2 polyps Polyps sent to pathology. ICD9: Colon Polyps: 211.3.    Comments: NO CANCER PRESENT Assessment Abnormal examination, see findings above.  Diagnoses: 211.3: Colon Polyps.   Events  Unplanned Interventions: No intervention was required.  Unplanned Events: There were no complications. Plans Patient Education: Patient given standard instructions for:  Polyps.  Disposition: After procedure patient sent to recovery. After recovery patient sent home.  Scheduling/Referral: Await pathology to schedule patient.  Comments: F/U Dr Truett Perna  This report was created from the original endoscopy report, which was reviewed and signed by the above listed endoscopist.   cc:  Maurie Boettcher, MD      Heather Roberts, MD      Karle Plumber, MD

## 2010-10-07 NOTE — Discharge Summary (Signed)
Summary: Discharge Summary                    [facility]  Patient:    Megan Whitney, Megan Whitney Visit Number: [id number] MRN: [id number]          Service Type: SUR Location: 3300 [id number] Attending Physician:  Cameron Proud Dictated by:   Sherrie George, P.A. Admit Date:  06/11/2001 Disc. Date: 06/19/01   CC:         Heather Roberts, M.D.  Leighton Roach. Truett Perna, M.D.   Discharge Summary  DATE OF BIRTH:  [birth year]  REFERRING PHYSICIAN:  Heather Roberts, M.D.  CONSULTING PHYSICIAN:  Leighton Roach. Truett Perna, M.D.  ADMITTING DIAGNOSES: 1. A 2 cm right left lower lobe lesion, rule out carcinoma. 2. History of depression. 3. History of migraines. 4. History of hiatal hernia.  DISCHARGE DIAGNOSES: 1. 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 2. Postoperative intrathoracic bleed. 3. Postoperative anemia secondary to #2. 4. History of depression. 5. History of migraines. 6. Hiatal hernia.  PROCEDURES:  Right video-assisted thoracoscopy, right mini thoracotomy, wedge resection of the right lower lobe mass, June 11, 2001, Dr. Edwyna Shell.  HISTORY OF PRESENT ILLNESS:  The patient is a 55 year old white female medical patient of Dr. Heather Roberts who presented with some vague complaints of abdominal pain and bloating.  She reported that she had these symptoms for a number of years, but they have worsened.  An upper GI showed a mildly prominent esophageal ampulla versus a small hiatal hernia and prominent cryopharyngeal muscles.  Abdominal ultrasound revealed a 1.3 cm lesion in the upper pole of the right kidney with recommendation for follow-up CT.  CT of the abdomen and pelvis showed a 10 x 11 simple-appearing cystic lesion in the lateral aspect of the right kidney corresponding to the abnormality on the ultrasound as well as a lobulated 2.1 cm mass in the right lower lobe in the posterior costophrenic sulcus.  Follow-up in the chest  showed a 20 x 21 mm lobulated but not spiculated nodule at the base on the right.  No other lesions were noted.  She was referred to CVTS and Dr. Edwyna Shell.  After reviewing the studies, it was his recommendation she undergo right video-assisted thoracoscopy and resection of the mass.  The risks and benefits were discussed in detail and she was admitted for the same at this time.  PAST MEDICAL HISTORY:  History of severe depression, history of menorrhagia, history of incontinence, chronic headaches/migraines.  She has had a bilateral tubal ligation in 1986.  MEDICATIONS ON ADMISSION: 1. Wellbutrin SR 150 mg p.o. b.i.d. 2. Alprazolam, dose not known. 3. Prozac 20 mg q.d.  HABITS:  Patient has no history of tobacco use for the last 10 years and, when you ask her, she says she never smoked.  For further history and physical, please see the dictated note.  HOSPITAL COURSE:  Patient was admitted, underwent right video-assisted thoracoscopy, mini thoracotomy, and wedge resection of the right lower lobe. Patient tolerated the procedure well and returned to the recovery room, then 3300 in satisfactory condition.  Initial pathology was inconclusive.  On the first postoperative day, she had a gastric dilatation and eventually required NG tube for decompression.  Bowel sounds were somewhat diminished and, overall, she was stable.  She had no air leak.  The wound was okay and incision was good.  Initial hemoglobin postoperatively was 10.2 with a hematocrit of 29.1, white count 17.8,  and platelets of 218,000.  Electrolytes were normal.  The second postoperative day, her hemoglobin was down to 8 with a hematocrit of 23, and chest x-ray showed new fluid accumulation.  It was Dr. Remo Lipps opinion that she had bled postoperatively into her chest.  Chest tube was left in and failed to really drain much of the bleed.  She was seen in consultation by hematology-oncology, and Dr. Truett Perna.  Preliminary  studies showed a mucinous adenocarcinoma which later was described as a well-differentiated mucinous adenocarcinoma.  They were uncertain if this was some form of bronchoalveolar primary or whether it was metastasis from another site.  Dr. Truett Perna followed the patient and checked a CEA, and this was within normal limits at 0.7, and he planned to follow the patient with bone and brain scans once she was stable.  She made slow, steady progress.  First chest tube was removed, second chest tube was readjusted in attempts to drain any additional fluid or hematoma.  This was in large part unsuccessful and removed the following day.  She has been mobilized and has made good progress. Her hemoglobin has actually stabilized.  She was down to 7.4 on June 15, 2001, and, as of June 18, 2001, she is 7.3 with a hematocrit of 21.3, and a white count of 11.6.  Platelets are normal at 393,000.  Chemistries show a sodium of 134, a potassium of 4, chloride of 103, CO2 of 27, BUN of 5, creatinine of 0.7, and a glucose of 114.  It did get up and, on June 12, 2001, was actually 176, glucose essentially normal since then.  At this point, the patient has been mobilized today, June 18, 2001.  She is receiving her bone scan.  CT scan of the head was obtained and this showed no metastasis. By June 19, 2001, it was Dr. Remo Lipps opinion that patient would be ready for discharge.  Bone scan results are currently pending.  Her sutures have all been removed.  Chest tube suture will be removed in the a.m. prior to discharge, also.  DISCHARGE ACTIVITY:  Light to moderate, no lifting over 10 pounds, no driving, no strenuous activity.  PFTs on admission were all normal with an FVC of 2.99, normal was 2.81.  FEV1 was 2.47 with a normal of 2.37.  FEF 25/75 was 2.92, with a normal of 2.78.  FOLLOW-UP:  Patient is scheduled to return for follow-up to see Dr. Truett Perna  in two to three weeks and return to see Dr.  Edwyna Shell on Wednesday, October 30, at 2 p.m., chest x-ray from the [facility] at 1 p.m.  Patient will bring the x-ray to Dr. Edwyna Shell.  CONDITION ON DISCHARGE:  Improving. Dictated by:   Sherrie George, P.A. Attending Physician:  Cameron Proud DD:  06/18/01 TD:  06/18/01 Job: [id number]

## 2010-10-07 NOTE — Discharge Summary (Signed)
Summary: Discharge summary    LOCATION:  1519                         FACILITY:  [facility]   PHYSICIAN:  Ollen Gross, M.D.    DATE OF BIRTH:  1955-11-27   DATE OF ADMISSION:  07/17/2006  DATE OF DISCHARGE:  07/20/2006                                 DISCHARGE SUMMARY   ADMISSION DIAGNOSES:  1. Osteoarthritis, right hip.  2. Postmenopausal.  3. History of migraines.  4. Bipolar disorder.  5. History of depression.  6. Hiatal hernia.  7. Reflux disease.  8. History of urinary tract infections.  9. History of cystitis.  10.Urinary incontinence.  11.History of lung carcinoma status post surgery.  12.Degenerative disk disease.  13.Fibromyalgia.  14.Past history of hypotensive episode following lung surgery.   DISCHARGE DIAGNOSES:  1. Osteoarthritis, right hip, status post right total hip arthroplasty.  2. Mild postoperative acute blood loss anemia, did not require      transfusion.  3. Postmenopausal.  4. History of migraines.  5. Bipolar disorder.  6. History of depression.  7. Hiatal hernia.  8. Reflux disease.  9. History of urinary tract infections.  10.History of cystitis.  11.Urinary incontinence.  12.History of lung carcinoma status post surgery.  13.Degenerative disk disease.  14.Fibromyalgia.  15.Past history of hypotensive episode following lung surgery.   PROCEDURE:  July 17, 2006, right total hip surgery by Dr. Despina Hick,  assistant Avel Peace, P.A.-C. Anesthesia general.   CONSULTS:  None.   BRIEF HISTORY:  Megan Whitney is a 55 year old female with severe, rapidly  progressive osteoarthritis of the right hip with intractable pain,  successful left total hip, now presents with right total hip.   LABORATORY DATA:  Preoperative CBC:  Hemoglobin 13.2, hematocrit 38.0, white  cell count 8.8. Postoperative hemoglobin 11.6. Last noted hemoglobin 9.7.  PT/PTT preoperatively 13.0 and 32, respectively. INR 1.0. Serial pro times  followed. Last noted INR 2.0. Chem  panel on admission:  All within normal  limits. Serial BMETs were followed. Electrolytes remained within normal  limits. Urine pregnancy test negative. Preoperative UA:  Specific gravity  slightly elevated at 1.036. Remaining UA negative. Blood group type A  positive.   X-RAYS:  Right hip film July 11, 2006:  Changes consistent with AVN,  right femoral head; this appears secondary to degenerative changes. CT chest  November 23, 2005:  Stable postoperative changes, right base, negative for  tumor recurrence.   EKG July 11, 2006:  Normal sinus rhythm, normal EKG; when compared no  significant change since last tracing confirmed by Dr. Susa Griffins.   HOSPITAL COURSE:  Admitted to [facility], tolerated procedure  well, later returned to the recovery room and the orthopedic floor. Given  PCA, p.o. analgesia for pain control following surgery. Given 24 hours of  postoperative IV antibiotics, started on Coumadin, partial weight bearing.  Started getting up with PT. On day 1, she was actually doing very well. PCA  was discontinued. Hemovac drain was pulled without difficulty. Hemoglobin  was down to 11. Excellent urine output. Started getting up and progressed  with physical therapy, ambulated about 150 feet on day 2, dressing changed,  incision looked good. Progressed well. Hemoglobin drifted down to 9.8,  placed on iron. By day 3, she was doing well.  Hemoglobin stabilized to 9.7.  Progressing well with therapy and was discharged home.   DISCHARGE PLAN:  1. The patient was discharged home on July 20, 2006.  2. Discharge diagnoses:  Please see above.  3. Discharge medications:  Coumadin, Vicodin and Robaxin.   DIET:  As tolerated.   ACTIVITY:  Partial weight bearing 25 to 50%. Home health PT. Home health  nursing. Total hip protocol. Hip precautions. Follow up in 2 weeks.   DISPOSITION:  To home.   CONDITION UPON DISCHARGE:  Improved.      Megan L.  Julien Whitney, P.A.      Ollen Gross, M.D.  Electronically Signed    ALP/MEDQ  D:  07/20/2006  T:  07/20/2006  Job:  [id number]

## 2010-10-07 NOTE — Consult Note (Signed)
Summary: [facility]   Imported By: Sherian Rein 09/15/2010 10:48:25  _____________________________________________________________________  External Attachment:    Type:   Image     Comment:   External Document

## 2010-10-07 NOTE — Procedures (Signed)
Summary: Colonoscopy   Colonoscopy  Procedure date:  12/28/2007  Findings:      Location:  [facility].  Results: Diverticulosis.         Comments:      Repeat colonoscopy in 5 years.   Procedures Next Due Date:    Colonoscopy: 01/2013 Patient Name: Megan Whitney, Megan Whitney. MRN: [id number] Procedure Procedures: Colonoscopy CPT: [phone].  Personnel: Endoscopist: Wilhemina Bonito. Marina Goodell, MD.  Exam Location: Exam performed in Outpatient Clinic. Outpatient  Patient Consent: Procedure, Alternatives, Risks and Benefits discussed, consent obtained, from patient. Consent was obtained by the RN.  Indications  Surveillance of: Adenomatous Polyp(s). This is an initial surveillance exam. Initial polypectomy was performed in 2002. in Dec. Pathology of worst  polyp: tubular adenoma.  Increased Risk Screening: Personal history of lung cancer.  History  Current Medications: Patient is not currently taking Coumadin.  Pre-Exam Physical: Performed Dec 28, 2007. Cardio-pulmonary exam, Rectal exam, HEENT exam , Abdominal exam, Mental status exam WNL.  Comments: Pt. history reviewed/updated, physical exam performed prior to initiation of sedation?yes Exam Exam: Extent of exam reached: Cecum, extent intended: Cecum.  The cecum was identified by appendiceal orifice and IC valve. Patient position: on left side. Time to Cecum: 00:07:10. Time for Withdrawl: 00:09:49. Colon retroflexion performed. Images taken. ASA Classification: II. Tolerance: excellent.  Monitoring: Pulse and BP monitoring, Oximetry used. Supplemental O2 given.  Colon Prep Used Miralax for colon prep. Prep results: excellent.  Sedation Meds: Patient assessed and found to be appropriate for moderate (conscious) sedation. Fentanyl 100 mcg. given IV. Versed 10 mg. given IV.  Findings - DIVERTICULOSIS: Ascending Colon to Sigmoid Colon. ICD9: Diverticulosis, Colon: 562.10.  NORMAL EXAM: Cecum to Rectum.    Assessment  Diagnoses: 562.10: Diverticulosis, Colon.   Comments: NO POLYPS SEEN Events  Unplanned Interventions: No intervention was required.  Unplanned Events: There were no complications. Plans Disposition: After procedure patient sent to recovery. After recovery patient sent home.  Scheduling/Referral: Colonoscopy, to Wilhemina Bonito. Marina Goodell, MD, IN 5 YEARS (PERS. HX POLYPS),

## 2010-10-07 NOTE — Assessment & Plan Note (Signed)
Summary: IRON DEF ANEMIA (EVAL FOR EGD)    History of Present Illness Visit Type: Initial Consult Primary GI MD: Yancey Flemings MD Primary Provider: Renford Dills, MD Requesting Provider: Renford Dills, MD Chief Complaint: Iron def anemia, consult for EGD. Pt has started on a iron supplement which has helped with her fatigue and SOB.  History of Present Illness:   55 year old white female with obesity, hyperlipidemia, bipolar disorder, adenomatous colon polyps, GERD, and remote lung cancer for which she underwent resection and is felt to be cured. She presents today regarding iron deficiency anemia. Patient reports to me having problems with shortness of breath and fatigue this fall. Around October she was noted to be anemic and reports a hemoglobin of 6 or 7. Review of outside laboratory shows a hemoglobin of 9.2 on November 2 after having been on iron therapy. More recent hemoglobin December 20 of 13.0. Symptoms have improved. The patient denies melena or hematochezia. She has not been a recent blood donor. GI review of systems is only remarkable for occasional breakthrough reflux and epigastric burning. Her weight has been stable. She was seen at the emergency room for epistaxis in late December. However, she denies previous problems with nosebleeds or problems since. She has had prior colonoscopies and upper endoscopies. Last upper endoscopy in 2002 with benign stricture and small hiatal hernia. Last colonoscopy in April 2009 with diverticulosis but no polyps. Initial colonoscopy in 2002 with a tubular adenoma. She tells me that Hemoccult studies were negative. Denies other bleeding such as vaginal or urologic. No NSAID use aside from baby aspirin. She continues on iron. P.r.n. PPI use only   GI Review of Systems    Reports acid reflux, belching, and  bloating.      Denies abdominal pain, chest pain, dysphagia with liquids, dysphagia with solids, heartburn, loss of appetite, nausea, vomiting,  vomiting blood, weight loss, and  weight gain.        Denies anal fissure, black tarry stools, change in bowel habit, constipation, diarrhea, diverticulosis, fecal incontinence, heme positive stool, hemorrhoids, irritable bowel syndrome, jaundice, light color stool, liver problems, rectal bleeding, and  rectal pain. Preventive Screening-Counseling & Management  Alcohol-Tobacco     Smoking Status: never    Current Medications (verified): 1)  Lamictal 100 Mg Tabs (Lamotrigine) .Marland Kitchen.. 1 By Mouth Once Daily 2)  Zyprexa 15 Mg Tabs (Olanzapine) .Marland Kitchen.. 1 By Mouth Once Daily 3)  Aspirin 81 Mg Tbec (Aspirin) .Marland Kitchen.. 1 By Mouth Once Daily 4)  Simvastatin 20 Mg Tabs (Simvastatin) .Marland Kitchen.. 1 By Mouth Once Daily 5)  Fluoxetine Hcl 40 Mg Caps (Fluoxetine Hcl) .... 2 Capsule Once Daily 6)  Abilify 15 Mg Tabs (Aripiprazole) .Marland Kitchen.. 1 By Mouth Once Daily 7)  Nac 600 600 Mg Caps (Acetylcysteine) .... 2 Tablets By Mouth Two Times A Day 8)  Ferrous Sulfate 325 (65 Fe) Mg Tabs (Ferrous Sulfate) .... One Tablet By Mouth Three Times A Day  Allergies (verified): 1)  ! Pcn 2)  ! * Zithromax (Z-Pak) 3)  ! Adhesive Tape  Past History:  Past Medical History: Anemia Hyperlipidemia Interstitial Cystitis Obesity bipolar Lung Ca. Anxiety Disorder Depression Adenomatous Colon Polyps 08/2001 GERD  Past Surgical History: Reviewed history from 09/09/2010 and no changes required. Rt. Lung Wedge resection Lt. Hip Replacement Rt. Hip Replacement Rt. shoulder Foot surgery lt. buniion and straightening of toes  Family History: Reviewed history from 09/09/2010 and no changes required. COPD-Father DM-father AAA-gather No FH of Colon Cancer:  Social History: Married Receptionist  Alcohol Use - yes Illicit Drug Use - no Patient has never smoked.  Daily Caffeine Use Smoking Status:  never  Review of Systems       The patient complains of anemia, anxiety-new, arthritis/joint pain, depression-new, fatigue, night  sweats, nosebleeds, shortness of breath, and urine leakage.  The patient denies allergy/sinus, back pain, blood in urine, breast changes/lumps, change in vision, confusion, cough, coughing up blood, fainting, fever, headaches-new, hearing problems, heart murmur, heart rhythm changes, itching, menstrual pain, muscle pains/cramps, pregnancy symptoms, skin rash, sleeping problems, sore throat, swelling of feet/legs, swollen lymph glands, thirst - excessive , urination - excessive , urination changes/pain, vision changes, and voice change.    Vital Signs:  Patient profile:   55 year old female Height:      60 inches Weight:      223.13 pounds BMI:     43.73 Pulse rate:   90 / minute Pulse rhythm:   regular BP sitting:   124 / 86  (left arm) Cuff size:   large  Vitals Entered By: Christie Nottingham CMA Duncan Dull) (September 14, 2010 9:13 AM)  Physical Exam  General:  Well developed,obese, well nourished, no acute distress. Head:  Normocephalic and atraumatic. Eyes:  PERRLA, no icterus. Ears:  Normal auditory acuity. Nose:  No deformity, discharge,  or lesions. Mouth:  No deformity or lesions, dentition normal. Neck:  Supple; no masses or thyromegaly. Lungs:  Clear throughout to auscultation. Heart:  Regular rate and rhythm; no murmurs, rubs,  or bruits. Abdomen:  Soft, nontender and nondistended. No masses, hepatosplenomegaly or hernias noted. Normal bowel sounds. Rectal:  deferred until colonoscopy Msk:  Symmetrical with no gross deformities. Normal posture. Pulses:  Normal pulses noted. Extremities:  No clubbing, cyanosis, edema or deformities noted. Neurologic:  Alert and  oriented x4;  grossly normal neurologically. Skin:  Intact without significant lesions or rashes. Psych:  Alert and cooperative. Normal mood and affect.   Impression & Recommendations:  Problem # 1:  ANEMIA, IRON DEFICIENCY (ICD-280.9) iron deficiency anemia. Etiology unclear. No evidence of GI bleeding by history or  Hemoccults. She has responded to iron therapy. GI review of systems remarkable for GERD/dyspepsia and a prior history of adenomatous colon polyps. Remote adenocarcinoma of the lung. Rule out occult GI mucosal lesion.  Plan: #1. Colonoscopy and upper endoscopy. The nature of the procedures as well as the risks, benefits, and alternatives were reviewed. She understood and agreed to proceed. Movi prep prescribed. The patient instructed on its use. Hold iron one week prior to the procedure. #2. If colonoscopy and upper endoscopy unrevealing, proceed with capsule endoscopy. Discussed this procedure with patient in detail as well.  Problem # 2:  GERD (ICD-530.81) active GERD symptoms off PPI. Prior upper endoscopy as described. Recommend reflux precautions. May need acid suppressive therapy regularly were on demand  Problem # 3:  PERSONAL HISTORY OF COLONIC POLYPS (ICD-V12.72) adenomatous colon polyps in 2002. Negative exam in 2009. Plan for followup at this time due to interval iron deficiency anemia  Other Orders: Colon/Endo (Colon/Endo)  Patient Instructions: 1)  Colon/Endo [facility] 10/05/10 2:00 pm arrive 1:00 pm 2)  Movi prep instructions given and Rx. sent to your pharmacy. 3)  Colonoscopy and Flexible Sigmoidoscopy brochure given.  4)  Upper Endoscopy brochure given.  5)  Hold Iron x 7 days prior starting 09/28/10 6)  Copy sent to : Renford Dills, MD 7)  The medication list was reviewed and reconciled.  All changed / newly prescribed medications were  explained.  A complete medication list was provided to the patient / caregiver. Prescriptions: MOVIPREP 100 GM  SOLR (PEG-KCL-NACL-NASULF-NA ASC-C) As per prep instructions.  #1 x 0   Entered by:   Milford Cage NCMA   Authorized by:   Hilarie Fredrickson MD   Signed by:   Milford Cage NCMA on 09/14/2010   Method used:   Electronically to        [facility]  [place] [address] (retail)       [address]       [place], [place]  [address]       Ph:  [phone] or [phone]       Fax: [phone]   RxID:   [id number]

## 2010-10-07 NOTE — Op Note (Signed)
Summary: Operative report                    [place]  Patient:    Megan Whitney, Megan Whitney Visit Number: [id number] MRN: [id number]          Service Type: SUR Location: 3300 3314 [id number] Attending Physician:  Cameron Proud Dictated by:   D. Karle Plumber, M.D. Admit Date:  06/11/2001   CC:         Heather Roberts, M.D.   Operative Report  PREOPERATIVE DIAGNOSIS:  Right lower lobe mass.  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  OPERATIONS PERFORMED: 1. Right video-assisted thoracoscopy. 2. Mini thoracotomy. 3. Wedge resection of right lower lobe lesion.  SURGEON:  D. Karle Plumber, M.D.  INDICATIONS:  This 55 year old nonsmoker was found to have a right lower lobe lesion on abdominal CT scans.  A chest CT scan revealed the lesion.  She was referred for resection.  DESCRIPTION OF PROCEDURE:  After general anesthesia and percutaneous insertion of all monitoring lines, the patient was turned to the right lower thoracotomy position.  Two trocar sites were made in the anterior posterior axillary line at the seventh intercostal space and in the mid axillary line at the fifth intercostal space.  Two trocars were inserted.  The lesion could be seen in in the diaphragmatic surface of the right lower lobe.  It was grasped with the Kiser ring forceps and then the mid trocar site was enlarged approximately 3 cm and a small laminar spreader was inserted.  The lesion was then resected with several applications of the auto suture 60 stapler and sent for frozen section.  They could not tell whether it was malignant or benign, although all they saw was multiple mucin.  Because they could not give a firm diagnosis, it was decided just to stop there.  Two chest tubes were inserted through the trocar sites and tied with 0 silk.  The chest was closed with one paracostal 1-0 Vicryl in the muscle layer and 3-0 Vicryl in a  subcuticular stitch.  A dry sterile dressing was applied.  The patient returned to the recovery room in stable condition. Dictated by:   D. Karle Plumber, M.D. Attending Physician:  Cameron Proud DD:  06/11/01 TD:  06/11/01 Job: [id number]

## 2010-10-07 NOTE — Op Note (Signed)
Summary: Operative report                    [place]. [facility]  Patient:    Megan Whitney, Megan Whitney Visit Number: [id number] MRN: [id number]          Service Type: SUR Location: 3300 3314 [id number] Attending Physician:  Cameron Proud Dictated by:   D. Karle Plumber, M.D. Proc. Date: 06/11/01 Admit Date:  06/11/2001   CC:         Heather Roberts, M.D.   Operative Report  PREOPERATIVE DIAGNOSIS:  Right lower lobe lesion.  POSTOPERATIVE DIAGNOSIS:  Right lower lobe lesion.  OPERATION PERFORMED:  Right patch with thoracotomy, wedge resection of the right lower lobe.  SURGEON:  D. Karle Plumber, M.D.  ASSISTANT:  Maxwell Marion, RNFA  INDICATIONS:  This patient had a lesion in the right lower lobe that was found on an abdominal pain workup and CT scan revealed a multiloculated 2.5 cm lesion in the right lower lobe.  It was decided to do a wedge resection under general anesthesia.  DESCRIPTION OF PROCEDURE:  She was turned to the right lateral thoracotomy position and the patient was prepped and draped in the usual sterile manner. Two trocar sites were made in the anterior and posterior axillary line at the seventh intercostal space, and a trocar was inserted and the lesion could be seen in the diaphragmatic surface in the right lower lobe.  For this reason, admitted trocar site was enlarged to 3-4 cm and a small laminar spreader was placed in the intercostal space, and through that the staplers could be applied using auto suture stapler and in several applications it was wedged out.  Focal seal was then applied to the staple line in the usual fashion. The lesion was found to be sometimes a mucinous tumor, but pathology could not tell on frozen section whether it was benign or malignant.  Two chest tubes were brought through the trocar sites and tied in place and the mid trocar site was closed with one pericostal #1 Vicryl in the muscle layer and 3-0 Vicryl with a  subcuticular stitch.  Dry sterile dressing was applied.  The patient was returned to recovery room in stable condition. Dictated by:   D. Karle Plumber, M.D. Attending Physician:  Cameron Proud DD:  06/11/01 TD:  06/11/01 Job: [id number]

## 2010-10-13 NOTE — Progress Notes (Signed)
Summary: Procedure tomorrow   Phone Note Call from Patient Call back at [phone]   Call For: Dr Marina Goodell Reason for Call: Talk to Nurse Summary of Call: Thinks she screwed up her prep. Has a procedure tomorrow. Initial call taken by: Leanor Kail [facility],  October 04, 2010 8:05 AM  Follow-up for Phone Call        pt mixed both "A" packets together, told to divide in 1/2 , then add "B" packet in. Told to add 1/2 liter of water in to make total of 1liter of fluid. Also told do not put "B" in 2nd dose until after drinking 1st dose. Pt did not want to buy 2nd moviprep if possible Follow-up by: Oda Cogan RN,  October 04, 2010 8:17 AM

## 2010-10-13 NOTE — Procedures (Addendum)
Summary: Colonoscopy  Patient: [name] Note: All result statuses are Final unless otherwise noted.  Tests: (1) Colonoscopy (COL)   COL Colonoscopy           DONE     [facility]     [address]     [place], [place]  [address]           COLONOSCOPY PROCEDURE REPORT           PATIENT:  Megan Whitney, Megan Whitney  MR#:  [id number]     BIRTHDATE:  02-Feb-1956, 55 yrs. old  GENDER:  female     ENDOSCOPIST:  Wilhemina Bonito. Eda Keys, MD     REF. BY:  Office     PROCEDURE DATE:  10/05/2010     PROCEDURE:  Colonoscopy with snare polypectomy x 1     ASA CLASS:  Class II     INDICATIONS:  history of pre-cancerous (adenomatous) colon polyps,     surveillance and high-risk screening, Iron deficiency anemia ;     index exam 2002 w/ TA. f/u 2009 negative     MEDICATIONS:   Fentanyl 75 mcg IV, Versed 9 mg IV, Benadryl 12.5     mg IV           DESCRIPTION OF PROCEDURE:   After the risks benefits and     alternatives of the procedure were thoroughly explained, informed     consent was obtained.  Digital rectal exam was performed and     revealed no abnormalities.   The LB 180AL [id number] endoscope was     introduced through the anus and advanced to the cecum, which was     identified by both the appendix and ileocecal valve, without     limitations.Time to cecum = 4:35 min. The quality of the prep was     excellent, using MoviPrep.  The instrument was then slowly     withdrawn (time = 12:14 min) as the colon was fully examined.     <<PROCEDUREIMAGES>>           FINDINGS:  A diminutive polyp was found in the proximal transverse     colon. Polyp was snared without cautery. Retrieval was     unsuccessful as polyp was too tiny.   Mild diverticulosis was     found in the sigmoid colon.  The terminal ileum appeared normal.     Otherwise normal colonoscopy without other polyps, masses, vascular     ectasias, or inflammatory changes.   Retroflexed views in the     rectum revealed no abnormalities.     The scope was then withdrawn     from the patient and the procedure completed.           COMPLICATIONS:  None     ENDOSCOPIC IMPRESSION:     1) Diminutive polyp in the proximal transverse colon - removed     2) Mild diverticulosis in the sigmoid colon     3) Normal terminal ileum     4) Otherwise normal colonoscopy           RECOMMENDATIONS:     1) Follow up colonoscopy in 5 years (personal hx adenomas)     2) Upper endoscopy will be scheduled today to further evaluate     anemia           ______________________________     Wilhemina Bonito. Eda Keys, MD           CC:  Renford Dills MD; The Patient           n.     eSIGNED:   Wilhemina Bonito. Eda Keys at 10/05/2010 03:23 PM           Britta Mccreedy, [id number]  Note: An exclamation mark (!) indicates a result that was not dispersed into the flowsheet. Document Creation Date: 10/05/2010 3:23 PM _______________________________________________________________________  (1) Order result status: Final Collection or observation date-time: 10/05/2010 15:12 Requested date-time:  Receipt date-time:  Reported date-time:  Referring Physician:   Ordering Physician: Fransico Setters [phone]) Specimen Source:  Source: Launa Grill Order Number: [id number] Lab site:   Appended Document: Colonoscopy recall     Procedures Next Due Date:    Colonoscopy: 09/2015

## 2010-10-13 NOTE — Miscellaneous (Signed)
Summary: Orders Update Clotest  Clinical Lists Changes  Orders: Added new Test order of TLB-H Pylori Screen Gastric Biopsy (83013-CLOTEST) - Signed 

## 2010-10-13 NOTE — Procedures (Addendum)
Summary: Upper Endoscopy  Patient: Nioma Mccubbins Note: All result statuses are Final unless otherwise noted.  Tests: (1) Upper Endoscopy (EGD)   EGD Upper Endoscopy       DONE     [facility]     [address]     [place], [place]  [address]           ENDOSCOPY PROCEDURE REPORT           PATIENT:  Keslie, Gritz  MR#:  [id number]     BIRTHDATE:  June 03, 1956, 55 yrs. old  GENDER:  female           ENDOSCOPIST:  Wilhemina Bonito. Eda Keys, MD     Referred by:  Office           PROCEDURE DATE:  10/05/2010     PROCEDURE:  EGD with biopsy, [id number]     ASA CLASS:  Class II     INDICATIONS:  iron deficiency anemia           MEDICATIONS:   There was residual sedation effect present from     prior procedure., Fentanyl 25 mcg IV, Versed 3 mg IV, Benadryl     12.5 mg IV     TOPICAL ANESTHETIC:  Exactacain Spray           DESCRIPTION OF PROCEDURE:   After the risks benefits and     alternatives of the procedure were thoroughly explained, informed     consent was obtained.  The [facility] GIF-H180 [id number] endoscope was     introduced through the mouth and advanced to the third portion of     the duodenum, without limitations.  The instrument was slowly     withdrawn as the mucosa was fully examined.     <<PROCEDUREIMAGES>>           Esophagitis (friable mucosa with edema) was found in the distal     esophagus.  Cameron's lesions were associated with a 6 cm hiatal     hernia. The stomach was otherwise normal.  Nonerosive Duodenitis     was found in the bulb of the duodenum. The duodenum to D3 was     otherwise normal.    Retroflexed views revealed the hiatal hernia.     Clo bx taken.  The scope was then withdrawn from the patient and     the procedure completed.           COMPLICATIONS:  None           ENDOSCOPIC IMPRESSION:     1) Esophagitis in the distal esophagus     2) Cameron's lesions likely cause for iron deficiency anemia     3) Duodenitis in the bulb of duodenum     4) GERD       RECOMMENDATIONS:     1) Anti-reflux regimen to be followed     2) Prilosec OTC 20mg  daily     3) Rx CLO if positive     4) Continue with daily iron supplement indefinitely and have Dr.     Nehemiah Settle check your blood counts periodically to make sure that they     stay in the normal range, as they are now           ______________________________     Wilhemina Bonito. Eda Keys, MD           CC:  Renford Dills MD;  The Patient  n.     eSIGNED:   Wilhemina Bonito. Eda Keys at 10/05/2010 03:39 PM           Britta Mccreedy, [id number]  Note: An exclamation mark (!) indicates a result that was not dispersed into the flowsheet. Document Creation Date: 10/05/2010 3:39 PM _______________________________________________________________________  (1) Order result status: Final Collection or observation date-time: 10/05/2010 15:28 Requested date-time:  Receipt date-time:  Reported date-time:  Referring Physician:   Ordering Physician: Fransico Setters [phone]) Specimen Source:  Source: Launa Grill Order Number: [id number] Lab site:

## 2011-01-21 NOTE — Discharge Summary (Signed)
[place]  Patient:    Megan Whitney, Megan Whitney Visit Number: [id number] MRN: [id number]          Service Type: SUR Location: 3300 3314 [id number] Attending Physician:  Cameron Proud Dictated by:   Sherrie George, P.A. Admit Date:  06/11/2001 Disc. Date: 06/19/01   CC:         Heather Roberts, M.D.  Leighton Roach. Truett Perna, M.D.   Discharge Summary  DATE OF BIRTH:  Dec 27, 1955  REFERRING PHYSICIAN:  Heather Roberts, M.D.  CONSULTING PHYSICIAN:  Leighton Roach. Truett Perna, M.D.  ADMITTING DIAGNOSES: 1. A 2 cm right left lower lobe lesion, rule out carcinoma. 2. History of depression. 3. History of migraines. 4. History of hiatal hernia.  DISCHARGE DIAGNOSES: 1. 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 2. Postoperative intrathoracic bleed. 3. Postoperative anemia secondary to #2. 4. History of depression. 5. History of migraines. 6. Hiatal hernia.  PROCEDURES:  Right video-assisted thoracoscopy, right mini thoracotomy, wedge resection of the right lower lobe mass, June 11, 2001, Dr. Edwyna Shell.  HISTORY OF PRESENT ILLNESS:  The patient is a 55 year old white female medical patient of Dr. Heather Roberts who presented with some vague complaints of abdominal pain and bloating.  She reported that she had these symptoms for a number of years, but they have worsened.  An upper GI showed a mildly prominent esophageal ampulla versus a small hiatal hernia and prominent cryopharyngeal muscles.  Abdominal ultrasound revealed a 1.3 cm lesion in the upper pole of the right kidney with recommendation for follow-up CT.  CT of the abdomen and pelvis showed a 10 x 11 simple-appearing cystic lesion in the lateral aspect of the right kidney corresponding to the abnormality on the ultrasound as well as a lobulated 2.1 cm mass in the right lower lobe in the posterior costophrenic sulcus.  Follow-up in the chest showed a 20 x 21  mm lobulated but not spiculated nodule at the base on the right.  No other lesions were noted.  She was referred to CVTS and Dr. Edwyna Shell.  After reviewing the studies, it was his recommendation she undergo right video-assisted thoracoscopy and resection of the mass.  The risks and benefits were discussed in detail and she was admitted for the same at this time.  PAST MEDICAL HISTORY:  History of severe depression, history of menorrhagia, history of incontinence, chronic headaches/migraines.  She has had a bilateral tubal ligation in 1986.  MEDICATIONS ON ADMISSION: 1. Wellbutrin SR 150 mg p.o. b.i.d. 2. Alprazolam, dose not known. 3. Prozac 20 mg q.d.  HABITS:  Patient has no history of tobacco use for the last 10 years and, when you ask her, she says she never smoked.  For further history and physical, please see the dictated note.  HOSPITAL COURSE:  Patient was admitted, underwent right video-assisted thoracoscopy, mini thoracotomy, and wedge resection of the right lower lobe. Patient tolerated the procedure well and returned to the recovery room, then 3300 in satisfactory condition.  Initial pathology was inconclusive.  On the first postoperative day, she had a gastric dilatation and eventually required NG tube for decompression.  Bowel sounds were somewhat diminished and, overall, she was stable.  She had no air leak.  The wound was okay and incision was good.  Initial hemoglobin postoperatively was 10.2 with a hematocrit of 29.1, white count 17.8, and platelets of 218,000.  Electrolytes were normal.  The second postoperative day, her hemoglobin was down to 8 with a hematocrit  of 23, and chest x-ray showed new fluid accumulation.  It was Dr. Remo Lipps opinion that she had bled postoperatively into her chest.  Chest tube was left in and failed to really drain much of the bleed.  She was seen in consultation by hematology-oncology, and Dr. Truett Perna.  Preliminary studies showed a  mucinous adenocarcinoma which later was described as a well-differentiated mucinous adenocarcinoma.  They were uncertain if this was some form of bronchoalveolar primary or whether it was metastasis from another site.  Dr. Truett Perna followed the patient and checked a CEA, and this was within normal limits at 0.7, and he planned to follow the patient with bone and brain scans once she was stable.  She made slow, steady progress.  First chest tube was removed, second chest tube was readjusted in attempts to drain any additional fluid or hematoma.  This was in large part unsuccessful and removed the following day.  She has been mobilized and has made good progress. Her hemoglobin has actually stabilized.  She was down to 7.4 on June 15, 2001, and, as of June 18, 2001, she is 7.3 with a hematocrit of 21.3, and a white count of 11.6.  Platelets are normal at 393,000.  Chemistries show a sodium of 134, a potassium of 4, chloride of 103, CO2 of 27, BUN of 5, creatinine of 0.7, and a glucose of 114.  It did get up and, on June 12, 2001, was actually 176, glucose essentially normal since then.  At this point, the patient has been mobilized today, June 18, 2001.  She is receiving her bone scan.  CT scan of the head was obtained and this showed no metastasis. By June 19, 2001, it was Dr. Remo Lipps opinion that patient would be ready for discharge.  Bone scan results are currently pending.  Her sutures have all been removed.  Chest tube suture will be removed in the a.m. prior to discharge, also.  DISCHARGE ACTIVITY:  Light to moderate, no lifting over 10 pounds, no driving, no strenuous activity.  PFTs on admission were all normal with an FVC of 2.99, normal was 2.81.  FEV1 was 2.47 with a normal of 2.37.  FEF 25/75 was 2.92, with a normal of 2.78.  FOLLOW-UP:  Patient is scheduled to return for follow-up to see Dr. Truett Perna  in two to three weeks and return to see Dr. Edwyna Shell on  Wednesday, October 30, at 2 p.m., chest x-ray from the [facility] at 1 p.m.  Patient will bring the x-ray to Dr. Edwyna Shell.  CONDITION ON DISCHARGE:  Improving. Dictated by:   Sherrie George, P.A. Attending Physician:  Cameron Proud DD:  06/18/01 TD:  06/18/01 Job: [id number]

## 2011-01-21 NOTE — Consult Note (Signed)
[place]. [facility]  Patient:    Megan Whitney, Megan Whitney Visit Number: [id number] MRN: [id number]          Service Type: SUR Location: 3300 3314 01 Attending Physician:  Cameron Proud Dictated by:   Lorette Ang, N.P. Proc. Date: 06/14/01 Admit Date:  06/11/2001   CC:         Heather Roberts, M.D.  D. Karle Plumber, M.D.   Consultation Report  DATE OF BIRTH:  Jun 24, 1956  REASON FOR CONSULTATION:  Lung mass.  REFERRING PHYSICIAN:  D. Karle Plumber, M.D.  HISTORY OF PRESENT ILLNESS:  Megan Whitney is a 55 year old woman who presented to her primary care Aseel Truxillo in August of 2002 with complaints of vague abdominal pain and bloating. She reports that she has had these symptoms for a number of years, but that they have recently worsened. An upper GI showed mildly prominent esophageal ampulla versus a small sliding hiatal hernia and prominent cricopharyngeus muscle. Abdominal ultrasound revealed a 1.3 cm lesion in the upper pole of the right kidney with recommendation for a follow-up CT scan. CT of the abdomen and pelvis showed a 10 x 11 mm simple-appearing cystic lesion in the lateral aspect of the right kidney corresponding to the abnormality on ultrasound, as well as a lobulated 2.1 cm mass in the right lower lobe in the posterior costophrenic sulcus. A follow-up chest CT confirmed the presence of a 21 x 20 mm lobulated but not speculated nodule at the right base. No other lesions were noted. The patient was referred to D. Karle Plumber, M.D.; and on June 11, 2001, underwent a right VATS/minithoracotomy with wedge resection of the right lower lobe lesion. Final pathology [id number]) confirmed a mucinous adenocarcinoma, primary versus metastatic.  PAST MEDICAL HISTORY/PAST SURGICAL HISTORY: 1. Depression. 2. Migraines. 3. Interstitial cystitis. 4. Status post right shoulder surgery. 5. Status post LEEP for an abnormal Pap  smear.  MEDICATIONS: 1. Albuterol nebulizers q.4h. 2. Xanax 0.5 mg daily. 3. Dulcolax 10 mg daily. 4. Wellbutrin 150 mg b.i.d. 5. Prozac 20 mg daily. 6. Reglan 10 mg q.6h. 7. Morphine PCA. 8. Vioxx 50 mg daily.  ALLERGIES: 1. ZITHROMAX. 2. ORAL CT CONTRAST. 3. FISH.  FAMILY HISTORY:  Mother is 62 years old and has a history of CHF, hiatal hernia.  Father is 80 and has diabetes mellitus type 2. The patient reports she has a half-sister who is healthy.  SOCIAL HISTORY:  Megan Whitney lives in [place] with her husband. She has one daughter who is 51 years old and reported to be healthy. The patient is employed as a Psychologist, forensic. She has no history of tobacco use. She does have some history of second-hand smoke exposure. She reports ETOH use as "infrequent."  REVIEW OF SYSTEMS:  The patient reports significant weight gain over the past five years since beginning Prozac. She denies any anorexia. She has had no fever. She does report some night sweats for the past three to four years. She also reports a history of migraine headaches. She denies any vision changes. She has no hearing deficits. She denies any mouth sores. She does report an enlarged posterior cervical lymph node which improved after a course of antibiotics. She reports dyspnea on exertion. She denies any cough. She has had no hemoptysis. She denies any chest pain or edema. She does report some occasional diarrhea over the past six to seven months, as well as some nausea. She denies any rectal bleeding, and she has  no black stools. She does report a longstanding history of abdominal pain and bloating which has recently worsened. She denies any hematuria or dysuria. She has had no abnormal vaginal bleeding or discharge. She denies any breast masses or breast pain. She has had no falls.  PHYSICAL EXAMINATION:  VITAL SIGNS:  Temperature 97.8, heart rate 86, respirations 18, blood pressure 86/44, oxygen  saturation 96%.  GENERAL:  Well-nourished, sleepy, white female in no acute distress.  HEENT:  Normocephalic, atraumatic. Pupils are equal, round, and reactive to light. Extraocular movements are intact. Sclerae anicteric. Oropharynx is clear.  LYMPH:  No palpable cervical, supraclavicular, axillary, or inguinal lymph nodes.  BREASTS:  No masses palpated.  LUNGS:  Clear anteriorly. Right chest tube intact.  CARDIOVASCULAR:  Regular rate and rhythm. No murmur.  ABDOMEN:  Soft and nontender. No hepatosplenomegaly.  EXTREMITIES:  No clubbing, cyanosis, or edema.  NEUROLOGICAL:  Sleepy, easily aroused, oriented x 3, follows commands.  LABORATORY DATA:  Hemoglobin 8.1, white count 16.5, platelets 216,000. Sodium 133, potassium 4, BUN 4, creatinine 0.8, glucose 117, calcium 8. Preoperative labs:  Hemoglobin 12.7, white count 8.1, platelets 224,000. Sodium 135, potassium 3.8, BUN 13, creatinine 0.8, glucose 97, total bilirubin 0.7, alkaline phosphatase 46, SGOT 22, SGPT 18, total protein 6.5, albumin 3.6, and a calcium of 8.8.  Chest CT:  A 21 x 20 mm lobulated but not speculated nodule at the right base.  Abdominal CT:  A 10 x 11 mm simple-appearing cystic lesion in the lateral aspect of the right kidney, lobulated 2.1 cm mass in the right lower lobe.  Pelvic CT:  Two small cysts, right ovary. Tiny cyst on the left ovary.  IMPRESSION AND PLAN:  Megan Whitney is a 55 year old nonsmoker who was found to have a right lower lobe mass. She is now status post right VATS/wedge resection with pathology confirming a mucinous adenocarcinoma. It is difficult to know whether the right lung lesion represents a primary non-small cell lung cancer or is a metastasis. Her only localizing symptoms have been gastrointestinal with "bloating and pain" for years.   We agree with staging brain and bone scans. We will also check a CEA level.  If no other metastatic disease is found in the staging  scans, we will need to consider a GI workup to rule out a colonic or small bowel primary.  If no other disease is found after the above workup, then would treat as a stage 1 non-small cell lung cancer and consider a right lower lobectomy.  We will continue to follow with you.  The patient seen and examined by Jillyn Hidden B. Truett Perna, M.D. Labs and x-rays reviewed. Dictated by:   Lorette Ang, N.P. Attending Physician:  Cameron Proud DD:  06/14/01 TD:  06/15/01 Job: [id number]

## 2011-01-21 NOTE — Op Note (Signed)
[place]. [facility]  Patient:    Megan Whitney, Megan Whitney Visit Number: [id number] MRN: [id number]          Service Type: SUR Location: 3300 3314 01 Attending Physician:  Cameron Proud Dictated by:   D. Karle Plumber, M.D. Proc. Date: 06/11/01 Admit Date:  06/11/2001   CC:         Heather Roberts, M.D.   Operative Report  PREOPERATIVE DIAGNOSIS:  Right lower lobe lesion.  POSTOPERATIVE DIAGNOSIS:  Right lower lobe lesion.  OPERATION PERFORMED:  Right patch with thoracotomy, wedge resection of the right lower lobe.  SURGEON:  D. Karle Plumber, M.D.  ASSISTANT:  Maxwell Marion, RNFA  INDICATIONS:  This patient had a lesion in the right lower lobe that was found on an abdominal pain workup and CT scan revealed a multiloculated 2.5 cm lesion in the right lower lobe.  It was decided to do a wedge resection under general anesthesia.  DESCRIPTION OF PROCEDURE:  She was turned to the right lateral thoracotomy position and the patient was prepped and draped in the usual sterile manner. Two trocar sites were made in the anterior and posterior axillary line at the seventh intercostal space, and a trocar was inserted and the lesion could be seen in the diaphragmatic surface in the right lower lobe.  For this reason, admitted trocar site was enlarged to 3-4 cm and a small laminar spreader was placed in the intercostal space, and through that the staplers could be applied using auto suture stapler and in several applications it was wedged out.  Focal seal was then applied to the staple line in the usual fashion. The lesion was found to be sometimes a mucinous tumor, but pathology could not tell on frozen section whether it was benign or malignant.  Two chest tubes were brought through the trocar sites and tied in place and the mid trocar site was closed with one pericostal #1 Vicryl in the muscle layer and 3-0 Vicryl with a subcuticular stitch.  Dry  sterile dressing was applied.  The patient was returned to recovery room in stable condition. Dictated by:   D. Karle Plumber, M.D. Attending Physician:  Cameron Proud DD:  06/11/01 TD:  06/11/01 Job: [id number]

## 2011-01-21 NOTE — Op Note (Signed)
[place]. [facility]  Patient:    Megan Whitney, Megan Whitney Visit Number: [id number] MRN: [id number]          Service Type: SUR Location: 3300 3314 01 Attending Physician:  Cameron Proud Dictated by:   D. Karle Plumber, M.D. Admit Date:  06/11/2001   CC:         Heather Roberts, M.D.   Operative Report  PREOPERATIVE DIAGNOSIS:  Right lower lobe mass.  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  OPERATIONS PERFORMED: 1. Right video-assisted thoracoscopy. 2. Mini thoracotomy. 3. Wedge resection of right lower lobe lesion.  SURGEON:  D. Karle Plumber, M.D.  INDICATIONS:  This 54 year old nonsmoker was found to have a right lower lobe lesion on abdominal CT scans.  A chest CT scan revealed the lesion.  She was referred for resection.  DESCRIPTION OF PROCEDURE:  After general anesthesia and percutaneous insertion of all monitoring lines, the patient was turned to the right lower thoracotomy position.  Two trocar sites were made in the anterior posterior axillary line at the seventh intercostal space and in the mid axillary line at the fifth intercostal space.  Two trocars were inserted.  The lesion could be seen in in the diaphragmatic surface of the right lower lobe.  It was grasped with the Kiser ring forceps and then the mid trocar site was enlarged approximately 3 cm and a small laminar spreader was inserted.  The lesion was then resected with several applications of the auto suture 60 stapler and sent for frozen section.  They could not tell whether it was malignant or benign, although all they saw was multiple mucin.  Because they could not give a firm diagnosis, it was decided just to stop there.  Two chest tubes were inserted through the trocar sites and tied with 0 silk.  The chest was closed with one paracostal 1-0 Vicryl in the muscle layer and 3-0 Vicryl in a subcuticular stitch.  A  dry sterile dressing was applied.  The patient returned to the recovery room in stable condition. Dictated by:   D. Karle Plumber, M.D. Attending Physician:  Cameron Proud DD:  06/11/01 TD:  06/11/01 Job: [id number]

## 2011-01-21 NOTE — Discharge Summary (Signed)
NAME:  Megan Whitney, Megan Whitney              ACCOUNT NO.:  [id number]   MEDICAL RECORD NO.:  [id number]          PATIENT TYPE:  INP   LOCATION:  1621                         FACILITY:  [facility]   PHYSICIAN:  Ollen Gross, M.D.    DATE OF BIRTH:  07-01-1956   DATE OF ADMISSION:  06/15/2005  DATE OF DISCHARGE:  06/20/2005                                 DISCHARGE SUMMARY   ADMITTING DIAGNOSES:  1.  Osteoarthritis left hip.  2.  History of migraines.  3.  Bipolar disorder with history of depression.  4.  Hiatal hernia.  5.  Reflux disease.  6.  History of urinary tract infection.  7.  History of cystitis.  8.  Urinary incontinence.  9.  Past history of hypotensive episode from previous lung surgery.  10. History of lung carcinoma.  11. Degenerative disk disease.  12. Fibromyalgia.   DISCHARGE DIAGNOSES:  1.  Osteoarthritis left hip status post left total hip arthroplasty.  2.  Mild postoperative hyponatremia, improving.  3.  History of migraines.  4.  Bipolar disorder with history of depression.  5.  Hiatal hernia.  6.  Reflux disease.  7.  History of urinary tract infection.  8.  History of cystitis.  9.  Urinary incontinence.  10. Past history of hypotensive episode from previous lung surgery.  11. History of lung carcinoma.  12. Degenerative disk disease.  13. Fibromyalgia.   PROCEDURE:  June 15, 2005 left total hip.   SURGEON:  Dr. Lequita Halt.   ASSISTANT:  Perkins, P.A.-C.   ANESTHESIA:  General.   ESTIMATED BLOOD LOSS:  200 mL.   DRAINS:  Hemovac drains x1.   CONSULTATIONS:  Rehab Services.   BRIEF HISTORY:  [name] is a 55 year old female with end-stage arthritis to  the left hip with some dysplasia.  She has bone-on-bone changes with  intractable pain.  Due to significant findings, she was admitted for total  hip.   LABORATORY DATA:  Preop CBC hemoglobin 14.3, hematocrit 41.7, differential  within normal limits.  Postop hemoglobin 11.8, last known H&H 11.6 and  33.4,  PT/PTT preop12.3 and 26 respectively.  Serial pro time followup last known  PT/INR 20.6 and 1.7.  Chem panel on admission all within normal limits.  Serial BMETs are followed  -- sodium dropped from 140 to 131, back up to  132, glucose went up from 92 to 144.  Preoperative UA -- negative.  Blood  group type A positive.  Preop EKG June 06, 2005 normal sinus rhythm,  normal EKG, confirmed by Dr. Molly Maduro Severe.  Left hip film June 06, 2005 -  - advanced degenerative changes involving the left hip.  Hip and pelvis film  on June 15, 2005 -- left total hip arthroplasty without complications.   HOSPITAL COURSE:  The patient was admitted to [facility] and  tolerated the procedure well.  Later transferred from the recovery room to  the orthopedic floor.  Started on PC and p.r.n. analgesics.  Rehab consult  was ordered postop.  It was felt by rehab that possibly could progress well,  but they would monitor  her postoperatively, Hemovac drain was pulled on day  one.  Day two she had a little bit of discomfort with transfers, but getting  better.  She was placed on 25-50% partial weight-bearing.  Pulse was  elevated postop and felt to be due to pain, but if it continued to be  elevated, would work it up with EKG.  She was doing a little bit better by  the following day with her pain control.  By day three she was progressing a  little better with her physical therapy, up ambulating approximately 60  feet.  By day four she was progressing well with her therapy and wanted to  go home.  Discharge Planning started making home arrangements.  By June 20, 2005 she was doing well, dressings had been changed daily starting on  day two, incision looked good, no signs of infection, tolerating her meds  and was discharged home on June 20, 2005.   DISCHARGE PLAN:  The patient discharged home on June 20, 2005.   DISCHARGE DIAGNOSES:  Please see above.   DISCHARGE MEDICATIONS:   1.  Vicodin.  2.  Coumadin.  3.  Robaxin.   DIET:  Resume previous home diet.   ACTIVITY:  Partial weight-bearing 25-50% left lower extremity.  Gait  training ambulation, ADLs as per home health, physical therapy, home health  nursing.  Hip precautions -- total hip protocol.   FOLLOWUP:  Follow-up two weeks from surgery.   DISPOSITION:  Home.   CONDITION ON DISCHARGE:  Improved.      Alexzandrew L. Julien Girt, P.A.      Ollen Gross, M.D.  Electronically Signed    ALP/MEDQ  D:  07/30/2005  T:  07/30/2005  Job:  [id number]   cc:   Ollen Gross, M.D.  Fax: [phone]   Ellwood Dense, M.D.  Fax: [phone]

## 2011-01-21 NOTE — Op Note (Signed)
NAME:  Megan Whitney, Megan Whitney              ACCOUNT NO.:  [id number]   MEDICAL RECORD NO.:  [id number]          PATIENT TYPE:  INP   LOCATION:  1621                         [facility]AC[facility]:  [facility]   PHYSICIAN:  Ollen Gross, M.D.    DATE OF BIRTH:  1956-05-13   DATE OF PROCEDURE:  06/15/2005  DATE OF DISCHARGE:                                 OPERATIVE REPORT   PREOPERATIVE DIAGNOSIS:  Osteoarthritis left hip.   POSTOPERATIVE DIAGNOSIS:  Osteoarthritis left hip.   PROCEDURE:  Left total hip arthroplasty.   SURGEON:  Ollen Gross, M.D.   ASSISTANT:  Avel Peace, P.A.-C.   ANESTHESIA:  General.   ESTIMATED BLOOD LOSS:  200.   DRAINS:  Hemovac x1.   COMPLICATIONS:  None.   CONDITION:  Stable to recovery.   BRIEF CLINICAL NOTE:  Megan Whitney is a 55 year old female who has end-stage  arthritic change to the left hip with some dysplasia of her acetabulum. She  has bone on bone changes and has intractable pain. At this point, it is  recommended that she pursue total hip arthroplasty. We did discuss bearing  surface options and despite her having a previous cancer history, she  elected for metal-on-metal. We discussed the pros and cons of that and she  realized that there is a questionable link with cancer but still preferred  to use a metal-on-metal because of the advantageous wear properties. I felt  that the risk was minimal and we proceeded with total hip arthroplasty using  metal-on-metal construct.   PROCEDURE IN DETAIL:  After successful administration of general anesthetic,  the patient's placed in the right lateral decubitus position with the left  side up and held with the hip positioner. The left lower extremity was  isolated from her  perineum with plastic drapes and prepped and draped in  the usual sterile fashion. A standard posterolateral incision is made with a  10 blade through the subcutaneous tissue to the level of the fascia lata  which is incised in line with the skin  incision. The sciatic nerve was  palpated and protected and the short rotators isolated off the femur.  Capsulectomy was performed and the hip is dislocated. The center of femoral  head is marked and the trial prosthesis placed such that the center of her  trial head corresponds to the center of her native femoral head. Osteotomy  line is marked on the femoral neck and osteotomy made with an oscillating  saw. The femoral head is removed and then the femur retracted anteriorly to  gain acetabular exposure.   Acetabular labrum and osteophytes are removed. Reaming begins at 45 mm  coursing in increments of 2 to 49 mm and then a 50 mm pinnacle acetabular  shell was placed in anatomic position and transfixed with two dome screws.  We had excellent purchase with the cup and with the screws. A trial 28 mm  neutral liner was placed.   The femur is prepared first with the canal finder and then irrigation. Axial  reaming is performed up to 13.5 mm, proximal reaming to an 18D and the  sleeve  machined to a small. An 18D small trial sleeve is placed with an 18 x  13 stem and a 36 plus eight neck. Her native anteversion was neutral so we  put approximately 20 degrees of anteversion into the construct. A 28 plus  zero head is used. The hip is reduced with outstanding stability, full  extension, full external rotation, 70 degrees flexion, 40 degrees adduction,  90 degrees internal rotation and 90 degrees of flexion and 70 degrees  internal rotation. By gauging the relationship between the tip of her  trochanter and center of the femoral head, we noted that the head was  slightly below the center which was not consistent with preop. I went to a  28 plus 3 head which restored the length and offset more appropriately. We  reduced this and again she had great stability even better than mentioned  above. The hip was then dislocated and all trials removed. The apex hole  eliminator is placed into the  acetabular shell and the 28 mm neutral Ultamet  metal liner was placed. It is a metal-on-metal hip replacement. The 18D  small sleeve is placed in the proximal femur and then an 18 x 13 stem with a  36 plus 8 neck once again about 20 degrees beyond her native anteversion.  The 28 plus 3 head is placed and the hip is reduced with the same stability  parameters. The wound was copiously irrigated. Of note, by placing the left  leg on top of the right, the leg lengths are found to be equal. The wound  was copiously irrigated and the short rotators reattached to the femur  through drill holes. The fascia lata is closed over a Hemovac drain with  interrupted #1 Vicryl, subcu closed in multiple layers with #1 and 2-0  Vicryl and subcuticular closed with running 4-0 Monocryl. The incision is  cleaned and dried and Steri-Strips and a bulky sterile dressing applied. She  is subsequently placed into a knee immobilizer, awakened and transported to  recovery in stable condition.      Ollen Gross, M.D.  Electronically Signed     FA/MEDQ  D:  06/15/2005  T:  06/16/2005  Job:  [id number]

## 2011-01-21 NOTE — Op Note (Signed)
NAME:  Megan Whitney, Megan Whitney              ACCOUNT NO.:  [id number]   MEDICAL RECORD NO.:  [id number]          PATIENT TYPE:  INP   LOCATION:  0004                         FACILITY:  [facility]   PHYSICIAN:  Ollen Gross, M.D.    DATE OF BIRTH:  [birth year]   DATE OF PROCEDURE:  07/17/2006  DATE OF DISCHARGE:                                 OPERATIVE REPORT   PREOPERATIVE DIAGNOSIS:  Osteoarthritis of right hip.   POSTOPERATIVE DIAGNOSIS:  Osteoarthritis of right hip.   PROCEDURE:  Right total hip arthroplasty.   SURGEON:  Ollen Gross, M.D.   ASSISTANT:  Avel Peace, P.A.   ANESTHESIA:  General.   ESTIMATED BLOOD LOSS:  400.   DRAIN:  Hemovac times one.   COMPLICATIONS:  None.   CONDITION.:  Stable to recovery.   BRIEF CLINICAL NOTE:  Megan Whitney is a 55 year old female who has severe rapidly  progressive osteoarthritis of the right hip with intractable pain.  She has  had a previous successful left total hip arthroplasty and presents now for  right total hip arthroplasty.   PROCEDURE IN DETAIL:  After the successful administration of general  anesthetic, the patient was placed in the left lateral decubitus position  with the right side up and held with the hip positioner.  Right lower  extremity was isolated from her perineum with plastic drapes and prepped and  draped in the usual sterile fashion.  A short posterolateral incision made  with 10 blade through subcutaneous tissue to the level of the fascia lata  which was incised in line with the skin incision.  Sciatic nerve was  palpated and protected and the short rotators isolated off the femur.  Capsulectomy was performed and the hip was dislocated.  The femoral head was  severely collapsed similar to what we would see with osteonecrosis.  The  cartilage was complete peeled off and the head was misshapen.  We gauged the  relationship from the tip of the trochanter to the center of the trial head  and marked the femoral neck based  on that.  The femoral head was then  resected with an oscillating saw.  The femur was then retracted anteriorly  to gain acetabular exposure.   Acetabulum had posterior-superior wear with slight defect there.  We removed  osteophytes and hypertrophic soft tissue.  Reaming started at 45 mm,  coursing in increments of 2 up to 51 mm, and then a 52-mm Pinnacle  acetabular shell was placed in anatomic position and transfixed with two  dome screws with excellent purchase.  The apex hole eliminator is placed and  the 36-mm neutral Ultamet liner was placed.  It is a metal-on-metal hip  replacement.   The femur was then prepared with the canal finder and irrigation.  Axial  reaming was performed to 13.5 mm, proximal reaming to an 18B, and the sleeve  machined to a small.  An 18B small trial sleeve was placed, and an 18 x 13  stem to 36 plus 8 neck, matching her native anteversion.  The 36 + 0 head is  placed first.  We went  up to 36 + 6 to get our best soft tissue tension.  She had great stability with full extension, flexion and rotation, 70  degrees of flexion, 40 degrees adduction and 90 degrees internal rotation  and 90 degrees of flexion and 70 degrees of internal rotation.  By placing  the right leg on top of the left, it was felt as though the leg lengths were  equal.  The trials were removed and a permanent 18B small sleeve was placed  with the 18 x 13 stem, 36 + 8 neck, and 36 + 6 head.  Hip was then reduced  with the same stability parameters.  Wound was copiously irrigated with  saline solution and the short rotators reattached to the femur through drill  holes.  Fascia lata was closed over the Hemovac drain with interrupted #1  Vicryl.  A very thick layer of subcu was closed in multiple layers with #1  and 2-0 Vicryl and subcuticular closure with running 4-0 Monocryl.  Incision  was cleaned and dried and a bulky sterile dressing applied.  She was placed  into a knee immobilizer,  awakened, and transported to recovery in stable  condition.      Ollen Gross, M.D.  Electronically Signed     FA/MEDQ  D:  07/17/2006  T:  07/17/2006  Job:  [id number]

## 2011-01-21 NOTE — Discharge Summary (Signed)
NAMEMarland Kitchen  TEEGHAN, HAMMER              ACCOUNT NO.:  [id number]   MEDICAL RECORD NO.:  [id number]          PATIENT TYPE:  INP   LOCATION:  1519                         [facility]ACILITY:  [facility]   PHYSICIAN:  Ollen Gross, M.D.    DATE OF BIRTH:  06/07/1956   DATE OF ADMISSION:  07/17/2006  DATE OF DISCHARGE:  07/20/2006                                 DISCHARGE SUMMARY   ADMISSION DIAGNOSES:  1. Osteoarthritis, right hip.  2. Postmenopausal.  3. History of migraines.  4. Bipolar disorder.  5. History of depression.  6. Hiatal hernia.  7. Reflux disease.  8. History of urinary tract infections.  9. History of cystitis.  10.Urinary incontinence.  11.History of lung carcinoma status post surgery.  12.Degenerative disk disease.  13.Fibromyalgia.  14.Past history of hypotensive episode following lung surgery.   DISCHARGE DIAGNOSES:  1. Osteoarthritis, right hip, status post right total hip arthroplasty.  2. Mild postoperative acute blood loss anemia, did not require      transfusion.  3. Postmenopausal.  4. History of migraines.  5. Bipolar disorder.  6. History of depression.  7. Hiatal hernia.  8. Reflux disease.  9. History of urinary tract infections.  10.History of cystitis.  11.Urinary incontinence.  12.History of lung carcinoma status post surgery.  13.Degenerative disk disease.  14.Fibromyalgia.  15.Past history of hypotensive episode following lung surgery.   PROCEDURE:  July 17, 2006, right total hip surgery by Dr. Despina Hick,  assistant Avel Peace, P.A.-C. Anesthesia general.   CONSULTS:  None.   BRIEF HISTORY:  Blen is a 55 year old female with severe, rapidly  progressive osteoarthritis of the right hip with intractable pain,  successful left total hip, now presents with right total hip.   LABORATORY DATA:  Preoperative CBC:  Hemoglobin 13.2, hematocrit 38.0, white  cell count 8.8. Postoperative hemoglobin 11.6. Last noted hemoglobin 9.7.  PT/PTT preoperatively  13.0 and 32, respectively. INR 1.0. Serial pro times  followed. Last noted INR 2.0. Chem panel on admission:  All within normal  limits. Serial BMETs were followed. Electrolytes remained within normal  limits. Urine pregnancy test negative. Preoperative UA:  Specific gravity  slightly elevated at 1.036. Remaining UA negative. Blood group type A  positive.   X-RAYS:  Right hip film July 11, 2006:  Changes consistent with AVN,  right femoral head; this appears secondary to degenerative changes. CT chest  November 23, 2005:  Stable postoperative changes, right base, negative for  tumor recurrence.   EKG July 11, 2006:  Normal sinus rhythm, normal EKG; when compared no  significant change since last tracing confirmed by Dr. Susa Griffins.   HOSPITAL COURSE:  Admitted to [facility], tolerated procedure  well, later returned to the recovery room and the orthopedic floor. Given  PCA, p.o. analgesia for pain control following surgery. Given 24 hours of  postoperative IV antibiotics, started on Coumadin, partial weight bearing.  Started getting up with PT. On day 1, she was actually doing very well. PCA  was discontinued. Hemovac drain was pulled without difficulty. Hemoglobin  was down to 11. Excellent urine output. Started getting up  and progressed  with physical therapy, ambulated about 150 feet on day 2, dressing changed,  incision looked good. Progressed well. Hemoglobin drifted down to 9.8,  placed on iron. By day 3, she was doing well. Hemoglobin stabilized to 9.7.  Progressing well with therapy and was discharged home.   DISCHARGE PLAN:  1. The patient was discharged home on July 20, 2006.  2. Discharge diagnoses:  Please see above.  3. Discharge medications:  Coumadin, Vicodin and Robaxin.   DIET:  As tolerated.   ACTIVITY:  Partial weight bearing 25 to 50%. Home health PT. Home health  nursing. Total hip protocol. Hip precautions. Follow up in 2 weeks.    DISPOSITION:  To home.   CONDITION UPON DISCHARGE:  Improved.      Alexzandrew L. Julien Girt, P.A.      Ollen Gross, M.D.  Electronically Signed    ALP/MEDQ  D:  07/20/2006  T:  07/20/2006  Job:  [id number]

## 2011-01-21 NOTE — H&P (Signed)
NAME:  Megan Whitney, Megan Whitney              ACCOUNT NO.:  [id number]   MEDICAL RECORD NO.:  [id number]          PATIENT TYPE:  INP   LOCATION:  NA                           FACILITY:  [facility]   PHYSICIAN:  Ollen Gross, M.D.    DATE OF BIRTH:  31-Aug-1956   DATE OF ADMISSION:  06/15/2005  DATE OF DISCHARGE:                                HISTORY & PHYSICAL   DATE OF OFFICE VISIT HISTORY AND PHYSICAL:  June 03, 2005   CHIEF COMPLAINT:  Left hip pain.   HISTORY OF PRESENT ILLNESS:  The patient is a 55 year old female seen by  Ollen Gross, M.D. for ongoing left hip and knee pain.  She has had a  progressive worsening history for sometime now, this has been ongoing for  over a year.  She is seen in the office where the pain is limiting her  activity.  She has had workup in the past and x-rays in the office show that  she has bone-on-bone degenerative end-stage changes in the left hip.  Knee  films were negative.  Due to the significant pain and findings, it is felt  that she would benefit from undergoing a total hip replacement.  Risks and  benefits have been discussed and she elects to proceed with surgery.   ALLERGIES:  Z-PAK, various types of PENICILLINS, PERCOCET, ORAL CONTRAST  (she is able to take Vicodin or hydrocodone).   CURRENT MEDICATIONS:  1.  Lamictal 300 mg q.a.m. for bipolar disorder.  2.  Tylenol Arthritis.  3.  Nabumetone 750 mg twice a day, stop prior to surgery.  4.  Nuvaring which is exchanged monthly.  5.  Trazodone 50 mg at night.   PAST MEDICAL HISTORY:  Bipolar disorder with depression, history of  migraines, previous history of hypotensive episodes following previous lung  surgery, hiatal hernia, reflux disease, history of urinary tract infections,  history of cystitis, urinary incontinence, history of lung carcinoma,  fibromyalgia, degenerative disk disease.   PAST SURGICAL HISTORY:  Lung surgery secondary to lung carcinoma in October  of 2002.  Right  shoulder surgery and also tubal ligation.   SOCIAL HISTORY:  Married.  Works as a Scientist, physiological.  Clinical research associate.  Occasional  intake of alcohol.  One child.  Wants to look into possible stay in the  rehab center postoperatively.   FAMILY HISTORY:  Mother with a history of heart disease, hypertension, and  arthritis.  Father with a history of COPD, emphysema, and diabetes.  Maternal grandmother with history of stroke.   REVIEW OF SYSTEMS:  GENERAL:  No fever, chills, or night sweats.  NEUROLOGY:  History of bipolar disorder and depression.  No seizures or syncope.  RESPIRATORY:  No shortness of breath, productive cough, or hemoptysis.  CARDIOVASCULAR:  Chest pain, angina, palpitations.  GASTROINTESTINAL:  History of hiatal hernia and reflux.  No recent flares.  No nausea,  vomiting, diarrhea, or constipation.  GENITOURINARY:  She does have urinary  frequency and urgency and also some occasional nocturia.  Occasional  dysuria.  No hematuria or dysuria at this time.  MUSCULOSKELETAL:  Left hip  on  the history of present illness.   PHYSICAL EXAMINATION:  VITAL SIGNS:  Pulse 68, respirations 20, blood  pressure 142/87.  GENERAL:  A 55 year old white female, well-developed, well-nourished, in no  acute distress, overweight especially with hip and thigh obesity.  Alert,  oriented, and cooperative.  Pleasant at the time of examination.  HEENT:  Normocephalic and atraumatic.  Pupils equal, round, and reactive to  light.  Oropharynx clear.  EOM's intact.  NECK:  Supple.  CHEST:  Clear.  Anterior and posterior chest sounds, no rhonchi, rales, or  wheezing.  HEART:  Regular rate and rhythm.  No murmur, S1, or S2 noted.  ABDOMEN:  Soft, brown, nontender, and bowel sounds present.  RECTAL:  BREASTS:  GENITOURINARY:  Not done, not pertinent to present  illness.  EXTREMITIES:  Left hip only shows flexion of 90 degrees.  There is 0  internal rotation, only 5 degrees of external rotation, and only 10  degrees  of abduction.  She does ambulate with an antalgic gait.   IMPRESSION:  1.  Osteoarthritis left hip.  2.  History of migraines.  3.  Bipolar disorder with depression.  4.  Hiatal hernia.  5.  Reflux disease.  6.  History of urinary tract infection.  7.  History of cystitis.  8.  Urinary incontinence.  9.  Past history of hypotensive episode from previous lung surgery.  10. History of lung carcinoma.  11. Degenerative disk disease.  12. Fibromyalgia.   PLAN:  The patient will be admitted to [facility] and  undergo a left total hip arthroplasty.  The surgery will be performed by Dr.  Lequita Halt.  The patient wants to look into inpatient rehab on a postoperative  basis.      Alexzandrew L. Julien Girt, P.A.      Ollen Gross, M.D.  Electronically Signed    ALP/MEDQ  D:  06/14/2005  T:  06/14/2005  Job:  [id number]

## 2011-01-21 NOTE — H&P (Signed)
NAME:  Megan Whitney, Megan Whitney              ACCOUNT NO.:  [id number]   MEDICAL RECORD NO.:  [id number]           PATIENT TYPE:   LOCATION:                                 FACILITY:   PHYSICIAN:  Ollen Gross, M.D.    DATE OF BIRTH:  06-14-1956   DATE OF ADMISSION:  07/17/2006  DATE OF DISCHARGE:                                HISTORY & PHYSICAL   DATE OF OFFICE VISIT AND HISTORY AND PHYSICAL:  July 04, 2006.   CHIEF COMPLAINT:  Right hip pain.   HISTORY OF PRESENT ILLNESS:  The patient is a 55 year old female who has  been seen by Dr. Lequita Halt for ongoing hip pain.  She had a previous left  total hip done last year and has been doing quite well with that.  She has  continued problems with the right hip.  It was felt she would benefit from  undergoing right hip replacement since she has done so well with the  previous left hip.  Risks and benefits discussed.  The patient subsequently  admitted to the hospital.   ALLERGIES:  Z-PAK, PENICILLIN DERIVATIVES, PERCOCET, ORAL CONTRAST.   THE PATIENT IS ABLE TO TAKE VICODIN OR HYDROCODONE.   CURRENT MEDICATIONS:  Lamictal, hydrocodone, Celebrex, Symbyax.   PAST MEDICAL HISTORY:  1. History of depression.  2. Reflux disease.  3. History of UTIs.  4. History of cystitis.  5. History of urinary incontinence.  6. Hiatal hernia.  7. Depression.  8. Bipolar disorder.  9. History of migraines.  10.History of lung carcinoma.  11.Degenerative disk disease.  12.Fibromyalgia.  13.Postmenopausal.  14.History of postop hypotensive episode from previous lung surgery.   PAST SURGICAL HISTORY:  1. Tubal ligation.  2. Right shoulder surgery.  3. Lung surgery secondary to cancer.  4. Previous left hip replacement October 2006.   SOCIAL HISTORY:  Married.  Works as a Scientist, physiological.  Nonsmoker, 1-2 glasses  of wine per week.  One child.   FAMILY HISTORY:  Father with history of heart disease and diabetes, mother  with history of arthritis.   REVIEW OF SYSTEMS:  GENERAL:  No fevers, chills, night sweats.  NEUROLOGIC:  Does have a history of bipolar and depression.  No seizures, syncope,  paralysis.  RESPIRATORY: No shortness of breath, productive cough,  hemoptysis. CARDIOVASCULAR: No chest pain, angina, orthopnea.  GI: Hiatal  hernia and reflux, no recent issues.  Denies any nausea, vomiting, diarrhea,  constipation.  GU: Does have some urinary frequency and urgency.  History of  UTIs an cystitis.  No dysuria or hematuria.  MUSCULOSKELETAL: Right hip.   PHYSICAL EXAMINATION:  VITAL SIGNS: Pulse 63, respirations 12, blood  pressure 122/84.  GENERAL:  A 55 year old white female, well-nourished, well-developed, short  stature.  Some hip and thigh obesity, overweight.  Alert, oriented,  cooperative, pleasant; excellent historian.  HEENT: Normocephalic and atraumatic.  Pupils equal, round, and reactive.  Oropharynx clear.  EOM intact.  NECK:  Supple.  CHEST: Clear.  HEART:  Regular rate and rhythm.  No murmur. Normal S1, S2.  No rubs,  thrills, palpitations.  ABDOMEN: Soft, nontender.  Bowel sounds present.  RECTAL/BREASTS/GENITALIA:  Not done, not pertinent to present illness.  EXTREMITIES:  Right hip shows flexion of only 90 degrees.  There is 0  internal rotation, 0 external rotation, 0 abduction.   IMPRESSION:  1. Osteoarthritis, right hip.  2. Postmenopausal.  3. History of migraines.  4. Bipolar disorder.  5. History of depression.  6. Hiatal hernia.  7. Reflux disease.  8. History of urinary tract infections.  9. History of cystitis.  10.Urinary incontinence.  11.History of lung carcinoma status post surgery.  12.Degenerative disk disease.  13.Fibromyalgia.  14.Past history of hypotensive episode following lung surgery.   PLAN:  The patient will be admitted to [facility] and undergo a  right total hip arthroplasty.  Surgery will be performed by Dr. Ollen Gross.     Alexzandrew L. Julien Girt,  P.A.      Ollen Gross, M.D.  Electronically Signed   ALP/MEDQ  D:  07/16/2006  T:  07/16/2006  Job:  [id number]

## 2011-05-31 ENCOUNTER — Other Ambulatory Visit (HOSPITAL_COMMUNITY): Payer: Self-pay | Admitting: Internal Medicine

## 2011-05-31 DIAGNOSIS — Z1231 Encounter for screening mammogram for malignant neoplasm of breast: Secondary | ICD-10-CM

## 2011-07-12 ENCOUNTER — Ambulatory Visit (HOSPITAL_COMMUNITY): Payer: BC Managed Care – PPO

## 2011-07-19 ENCOUNTER — Ambulatory Visit (HOSPITAL_COMMUNITY)
Admission: RE | Admit: 2011-07-19 | Discharge: 2011-07-19 | Disposition: A | Discharge status: Home / Self Care | Payer: BC Managed Care – PPO | Source: Ambulatory Visit | Attending: Internal Medicine | Admitting: Internal Medicine

## 2011-07-19 DIAGNOSIS — Z1231 Encounter for screening mammogram for malignant neoplasm of breast: Secondary | ICD-10-CM

## 2011-08-31 ENCOUNTER — Other Ambulatory Visit (HOSPITAL_COMMUNITY)
Admission: RE | Admit: 2011-08-31 | Discharge: 2011-08-31 | Disposition: A | Discharge status: Home / Self Care | Payer: BC Managed Care – PPO | Source: Ambulatory Visit | Attending: Obstetrics and Gynecology | Admitting: Obstetrics and Gynecology

## 2011-08-31 ENCOUNTER — Other Ambulatory Visit: Payer: Self-pay | Admitting: Obstetrics and Gynecology

## 2011-08-31 DIAGNOSIS — Z01419 Encounter for gynecological examination (general) (routine) without abnormal findings: Secondary | ICD-10-CM | POA: Insufficient documentation

## 2011-11-22 ENCOUNTER — Other Ambulatory Visit: Payer: Self-pay | Admitting: Internal Medicine

## 2011-11-22 DIAGNOSIS — R05 Cough: Secondary | ICD-10-CM

## 2011-11-22 DIAGNOSIS — R059 Cough, unspecified: Secondary | ICD-10-CM

## 2011-11-24 ENCOUNTER — Ambulatory Visit
Admission: RE | Admit: 2011-11-24 | Discharge: 2011-11-24 | Disposition: A | Discharge status: Home / Self Care | Payer: BC Managed Care – PPO | Source: Ambulatory Visit | Attending: Internal Medicine | Admitting: Internal Medicine

## 2011-11-24 DIAGNOSIS — R05 Cough: Secondary | ICD-10-CM

## 2011-11-24 DIAGNOSIS — R059 Cough, unspecified: Secondary | ICD-10-CM

## 2011-11-24 MED ORDER — IOHEXOL 300 MG/ML  SOLN
75.0000 mL | Freq: Once | INTRAMUSCULAR | Status: AC | PRN
  Administered 2011-11-24: 75 mL via INTRAVENOUS

## 2012-05-14 ENCOUNTER — Inpatient Hospital Stay (HOSPITAL_COMMUNITY)
Admission: EM | Admit: 2012-05-14 | Discharge: 2012-05-17 | DRG: 395 | Disposition: A | Discharge status: Home / Self Care | Payer: BC Managed Care – PPO | Attending: Internal Medicine | Admitting: Internal Medicine

## 2012-05-14 ENCOUNTER — Inpatient Hospital Stay (HOSPITAL_COMMUNITY): Payer: BC Managed Care – PPO

## 2012-05-14 ENCOUNTER — Encounter (HOSPITAL_COMMUNITY): Payer: Self-pay | Admitting: Family Medicine

## 2012-05-14 DIAGNOSIS — R0600 Dyspnea, unspecified: Secondary | ICD-10-CM

## 2012-05-14 DIAGNOSIS — K228 Other specified diseases of esophagus: Secondary | ICD-10-CM | POA: Diagnosis present

## 2012-05-14 DIAGNOSIS — G4733 Obstructive sleep apnea (adult) (pediatric): Secondary | ICD-10-CM | POA: Diagnosis present

## 2012-05-14 DIAGNOSIS — F32A Depression, unspecified: Secondary | ICD-10-CM

## 2012-05-14 DIAGNOSIS — F329 Major depressive disorder, single episode, unspecified: Secondary | ICD-10-CM

## 2012-05-14 DIAGNOSIS — Z23 Encounter for immunization: Secondary | ICD-10-CM

## 2012-05-14 DIAGNOSIS — E785 Hyperlipidemia, unspecified: Secondary | ICD-10-CM | POA: Diagnosis present

## 2012-05-14 DIAGNOSIS — F3130 Bipolar disorder, current episode depressed, mild or moderate severity, unspecified: Secondary | ICD-10-CM | POA: Diagnosis present

## 2012-05-14 DIAGNOSIS — K449 Diaphragmatic hernia without obstruction or gangrene: Secondary | ICD-10-CM

## 2012-05-14 DIAGNOSIS — D509 Iron deficiency anemia, unspecified: Principal | ICD-10-CM

## 2012-05-14 DIAGNOSIS — R0609 Other forms of dyspnea: Secondary | ICD-10-CM

## 2012-05-14 DIAGNOSIS — Z79899 Other long term (current) drug therapy: Secondary | ICD-10-CM

## 2012-05-14 DIAGNOSIS — Z860100 Personal history of colon polyps, unspecified: Secondary | ICD-10-CM

## 2012-05-14 DIAGNOSIS — Z96649 Presence of unspecified artificial hip joint: Secondary | ICD-10-CM

## 2012-05-14 DIAGNOSIS — R1310 Dysphagia, unspecified: Secondary | ICD-10-CM

## 2012-05-14 DIAGNOSIS — K222 Esophageal obstruction: Secondary | ICD-10-CM

## 2012-05-14 DIAGNOSIS — M129 Arthropathy, unspecified: Secondary | ICD-10-CM | POA: Diagnosis present

## 2012-05-14 DIAGNOSIS — K2289 Other specified disease of esophagus: Secondary | ICD-10-CM | POA: Diagnosis present

## 2012-05-14 DIAGNOSIS — D649 Anemia, unspecified: Secondary | ICD-10-CM

## 2012-05-14 DIAGNOSIS — C801 Malignant (primary) neoplasm, unspecified: Secondary | ICD-10-CM

## 2012-05-14 DIAGNOSIS — F319 Bipolar disorder, unspecified: Secondary | ICD-10-CM

## 2012-05-14 DIAGNOSIS — Z85118 Personal history of other malignant neoplasm of bronchus and lung: Secondary | ICD-10-CM

## 2012-05-14 DIAGNOSIS — K219 Gastro-esophageal reflux disease without esophagitis: Secondary | ICD-10-CM

## 2012-05-14 HISTORY — DX: Sleep apnea, unspecified: G47.30

## 2012-05-14 HISTORY — DX: Epistaxis: R04.0

## 2012-05-14 HISTORY — DX: Malignant (primary) neoplasm, unspecified: C80.1

## 2012-05-14 HISTORY — DX: Malignant neoplasm of unspecified part of unspecified bronchus or lung: C34.90

## 2012-05-14 HISTORY — DX: Polyp of colon: K63.5

## 2012-05-14 HISTORY — DX: Bipolar disorder, unspecified: F31.9

## 2012-05-14 HISTORY — DX: Depression, unspecified: F32.A

## 2012-05-14 HISTORY — DX: Hyperlipidemia, unspecified: E78.5

## 2012-05-14 HISTORY — DX: Unspecified osteoarthritis, unspecified site: M19.90

## 2012-05-14 HISTORY — DX: Gastro-esophageal reflux disease without esophagitis: K21.9

## 2012-05-14 HISTORY — DX: Anemia, unspecified: D64.9

## 2012-05-14 HISTORY — DX: Headache: R51

## 2012-05-14 HISTORY — DX: Major depressive disorder, single episode, unspecified: F32.9

## 2012-05-14 LAB — COMPREHENSIVE METABOLIC PANEL
ALT: 19 U/L (ref 0–35)
AST: 21 U/L (ref 0–37)
CO2: 24 mEq/L (ref 19–32)
Calcium: 9.2 mg/dL (ref 8.4–10.5)
Creatinine, Ser: 0.87 mg/dL (ref 0.50–1.10)
GFR calc Af Amer: 85 mL/min — ABNORMAL LOW (ref >90)
GFR calc non Af Amer: 73 mL/min — ABNORMAL LOW (ref >90)
Sodium: 135 mEq/L (ref 135–145)
Total Protein: 6.5 g/dL (ref 6.0–8.3)

## 2012-05-14 LAB — CBC WITH DIFFERENTIAL/PLATELET
Eosinophils Absolute: 0.1 10*3/uL (ref 0.0–0.7)
Eosinophils Relative: 1 % (ref 0–5)
Lymphs Abs: 1.8 10*3/uL (ref 0.7–4.0)
MCH: 17.2 pg — ABNORMAL LOW (ref 26.0–34.0)
MCHC: 26.5 g/dL — ABNORMAL LOW (ref 30.0–36.0)
MCV: 65.1 fL — ABNORMAL LOW (ref 78.0–100.0)
Monocytes Absolute: 1.1 10*3/uL — ABNORMAL HIGH (ref 0.1–1.0)
Neutrophils Relative %: 67 % (ref 43–77)
Platelets: 369 10*3/uL (ref 150–400)
RDW: 18.6 % — ABNORMAL HIGH (ref 11.5–15.5)

## 2012-05-14 LAB — RETICULOCYTES
Retic Count, Absolute: 74.9 10*3/uL (ref 19.0–186.0)
Retic Ct Pct: 3.5 % — ABNORMAL HIGH (ref 0.4–3.1)

## 2012-05-14 LAB — ABO/RH: ABO/RH(D): A POS

## 2012-05-14 LAB — PREPARE RBC (CROSSMATCH)

## 2012-05-14 MED ORDER — SODIUM CHLORIDE 0.9 % IJ SOLN
3.0000 mL | Freq: Two times a day (BID) | INTRAMUSCULAR | Status: DC
  Administered 2012-05-14 – 2012-05-17 (×5): 3 mL via INTRAVENOUS

## 2012-05-14 MED ORDER — SODIUM CHLORIDE 0.9 % IV BOLUS (SEPSIS)
1000.0000 mL | Freq: Once | INTRAVENOUS | Status: AC
  Administered 2012-05-14: 1000 mL via INTRAVENOUS

## 2012-05-14 MED ORDER — SODIUM CHLORIDE 0.9 % IV SOLN
1000.0000 mL | INTRAVENOUS | Status: DC
  Administered 2012-05-14: 1000 mL via INTRAVENOUS

## 2012-05-14 MED ORDER — ONDANSETRON HCL 4 MG/2ML IJ SOLN
4.0000 mg | Freq: Once | INTRAMUSCULAR | Status: AC
  Administered 2012-05-14: 4 mg via INTRAVENOUS
  Filled 2012-05-14: qty 2

## 2012-05-14 MED ORDER — PANTOPRAZOLE SODIUM 40 MG IV SOLR
40.0000 mg | Freq: Two times a day (BID) | INTRAVENOUS | Status: DC
  Administered 2012-05-14: 40 mg via INTRAVENOUS
  Filled 2012-05-14 (×4): qty 40

## 2012-05-14 MED ORDER — ACETAMINOPHEN 650 MG RE SUPP: 650.0000 mg | Freq: Four times a day (QID) | RECTAL | Status: DC | PRN

## 2012-05-14 MED ORDER — FUROSEMIDE 10 MG/ML IJ SOLN
20.0000 mg | Freq: Once | INTRAMUSCULAR | Status: AC
  Administered 2012-05-14: 20 mg via INTRAVENOUS
  Filled 2012-05-14: qty 2

## 2012-05-14 MED ORDER — DIPHENHYDRAMINE HCL 25 MG PO CAPS
25.0000 mg | ORAL_CAPSULE | Freq: Once | ORAL | Status: AC
  Administered 2012-05-14: 25 mg via ORAL
  Filled 2012-05-14: qty 1

## 2012-05-14 MED ORDER — ACETAMINOPHEN 325 MG PO TABS
650.0000 mg | ORAL_TABLET | Freq: Once | ORAL | Status: AC
  Administered 2012-05-14: 650 mg via ORAL
  Filled 2012-05-14: qty 2

## 2012-05-14 MED ORDER — ARIPIPRAZOLE 5 MG PO TABS
2.5000 mg | ORAL_TABLET | Freq: Every day | ORAL | Status: DC
  Administered 2012-05-15 – 2012-05-17 (×3): 2.5 mg via ORAL
  Filled 2012-05-14 (×3): qty 1

## 2012-05-14 MED ORDER — ONDANSETRON HCL 4 MG/2ML IJ SOLN: 4.0000 mg | Freq: Four times a day (QID) | INTRAMUSCULAR | Status: DC | PRN

## 2012-05-14 MED ORDER — SODIUM CHLORIDE 0.9 % IV SOLN
INTRAVENOUS | Status: DC
  Administered 2012-05-14: 20:00:00 via INTRAVENOUS

## 2012-05-14 MED ORDER — ONDANSETRON HCL 4 MG PO TABS
4.0000 mg | ORAL_TABLET | Freq: Four times a day (QID) | ORAL | Status: DC | PRN
  Administered 2012-05-16: 4 mg via ORAL
  Filled 2012-05-14: qty 1

## 2012-05-14 MED ORDER — PANTOPRAZOLE SODIUM 40 MG IV SOLR
40.0000 mg | Freq: Once | INTRAVENOUS | Status: AC
  Administered 2012-05-14: 40 mg via INTRAVENOUS
  Filled 2012-05-14: qty 40

## 2012-05-14 MED ORDER — ADULT MULTIVITAMIN W/MINERALS CH
1.0000 | ORAL_TABLET | Freq: Every day | ORAL | Status: DC
  Administered 2012-05-14 – 2012-05-17 (×4): 1 via ORAL
  Filled 2012-05-14 (×5): qty 1

## 2012-05-14 MED ORDER — FLUOXETINE HCL 20 MG PO TABS
40.0000 mg | ORAL_TABLET | Freq: Every day | ORAL | Status: DC
  Administered 2012-05-15 – 2012-05-17 (×3): 40 mg via ORAL
  Filled 2012-05-14 (×3): qty 2

## 2012-05-14 MED ORDER — ONDANSETRON HCL 4 MG/2ML IJ SOLN: 4.0000 mg | Freq: Three times a day (TID) | INTRAMUSCULAR | Status: DC | PRN

## 2012-05-14 MED ORDER — LAMOTRIGINE 100 MG PO TABS
100.0000 mg | ORAL_TABLET | Freq: Every day | ORAL | Status: DC
  Administered 2012-05-15 – 2012-05-17 (×3): 100 mg via ORAL
  Filled 2012-05-14 (×3): qty 1

## 2012-05-14 MED ORDER — SODIUM CHLORIDE 0.9 % IV SOLN: INTRAVENOUS | Status: DC

## 2012-05-14 MED ORDER — ALBUTEROL SULFATE (5 MG/ML) 0.5% IN NEBU: 2.5000 mg | INHALATION_SOLUTION | RESPIRATORY_TRACT | Status: DC | PRN

## 2012-05-14 MED ORDER — ACETAMINOPHEN 325 MG PO TABS: 650.0000 mg | ORAL_TABLET | Freq: Four times a day (QID) | ORAL | Status: DC | PRN

## 2012-05-14 MED ORDER — ALUM & MAG HYDROXIDE-SIMETH 200-200-20 MG/5ML PO SUSP: 30.0000 mL | Freq: Four times a day (QID) | ORAL | Status: DC | PRN

## 2012-05-14 NOTE — ED Notes (Signed)
Pt states, "I am suppose to take Iron, but I do not take it because it makes my stomach hurt & cramp."

## 2012-05-14 NOTE — H&P (Signed)
PATIENT DETAILS Name: RAFEEF Whitney Age: 56 y.o. Sex: female Date of Birth: [birth year] Admit Date: 05/14/2012 Megan D, MD   CHIEF COMPLAINT:  Shortness of breath, vomiting  HPI: Patient is a 56 year old Caucasian female with a history of depression with bipolar disorder, history of GI bleed requiring PRBC transfusion from presumed candidal lesions, who presented to the ED for evaluation of exertional shortness of breath, weakness that has been going on for the past 2 weeks. Patient claims that for the past week or so she has also been vomiting almost on a daily basis. She has one to 2 episodes of vomiting daily. Vomitus does not contain blood. She denies any recent black tarry stools or frank hematochezia, however around 3 weeks ago she claims she had black tarry stools for round 2-3 days which she ignored. She claims that she gets hip pain and takes Advil at night 3-4 times a week. She also claims to have had burning sensation in the epigastric area for the past week or so. She actually went to her primary care's office and was found to have a hemoglobin of 4, this is reconfirmed in the ED as well, I was subsequently called to admit this patient for further evaluation and treatment. She denies any headache, denies any chest pain. Denies any abdominal pain apart from burning epigastric pain.  ALLERGIES:   Allergies  Allergen Reactions  . Azithromycin Anaphylaxis  . Penicillins Anaphylaxis    PAST MEDICAL HISTORY: Past Medical History  Diagnosis Date  . Anemia   . Bipolar 1 disorder   . Depression   . Cancer     PAST SURGICAL HISTORY: Past Surgical History  Procedure Date  . Wedge resection   . Joint replacement   . Total hip arthroplasty     bilateral    MEDICATIONS AT HOME: Prior to Admission medications   Medication Sig Start Date End Date Taking? Authorizing Provider  ARIPiprazole (ABILIFY) 5 MG tablet Take 2.5 mg by mouth daily.   Yes Historical Provider,  MD  FLUOXETINE HCL PO Take 60 mg by mouth daily.   Yes Historical Provider, MD  lamoTRIgine (LAMICTAL) 100 MG tablet Take 100 mg by mouth daily.   Yes Historical Provider, MD  Multiple Vitamin (MULTIVITAMIN WITH MINERALS) TABS Take 1 tablet by mouth daily.   Yes Historical Provider, MD  naproxen sodium (ANAPROX) 220 MG tablet Take 220 mg by mouth 2 (two) times daily as needed. For pain   Yes Historical Provider, MD  rOPINIRole (REQUIP) 0.25 MG tablet Take 0.25 mg by mouth 3 (three) times daily.   Yes Historical Provider, MD    FAMILY HISTORY: History reviewed. No pertinent family history.  SOCIAL HISTORY:  reports that she has never smoked. She does not have any smokeless tobacco history on file. She reports that she does not drink alcohol or use illicit drugs.  REVIEW OF SYSTEMS:  Constitutional:   No  weight loss, night sweats,  Fevers, chills, fatigue.  HEENT:    No headaches, Difficulty swallowing,Tooth/dental problems,Sore throat,  No sneezing, itching, ear ache, nasal congestion, post nasal drip,   Cardio-vascular: No chest pain,  Orthopnea, PND, swelling in lower extremities, anasarca, dizziness, palpitations  GI:  Indigestion, abdominal pain, nausea,  diarrhea, change in  bowel habits, loss of appetite  Resp: No shortness of breath with exertion or at rest.  No excess mucus, no productive cough, No non-productive cough,  No coughing up of blood.No change in color of mucus.No wheezing.No chest wall  deformity  Skin:  no rash or lesions.  GU:  no dysuria, change in color of urine, no urgency or frequency.  No flank pain.  Musculoskeletal: No joint pain or swelling.  No decreased range of motion.  No back pain.  Psych: No change in mood or affect. No depression or anxiety.  No memory loss.   PHYSICAL EXAM: Blood pressure 104/87, pulse 89, temperature 98.7 F (37.1 C), temperature source Oral, resp. rate 24, SpO2 100.00%.  General appearance :Awake, alert, not in  any distress. Speech Clear. Not toxic Looking. Looks pale. HEENT: Atraumatic and Normocephalic, pupils equally reactive to light and accomodation Neck: supple, no JVD. No cervical lymphadenopathy.  Chest:Good air entry bilaterally, no added sounds  CVS: S1 S2 regular, no murmurs.  Abdomen: Bowel sounds present, Non tender and not distended with no gaurding, rigidity or rebound. Extremities: B/L Lower Ext shows no edema, both legs are warm to touch, with  dorsalis pedis pulses palpable. Neurology: Awake alert, and oriented X 3, CN II-XII intact, Non focal Skin:No Rash Wounds:N/A  LABS ON ADMISSION:   Basename 05/14/12 1404  NA 135  K 3.4*  CL 99  CO2 24  GLUCOSE 143*  BUN 17  CREATININE 0.87  CALCIUM 9.2  MG --  PHOS --    Basename 05/14/12 1404  AST 21  ALT 19  ALKPHOS 67  BILITOT 0.3  PROT 6.5  ALBUMIN 3.4*   No results found for this basename: LIPASE:2,AMYLASE:2 in the last 72 hours  Basename 05/14/12 1404  WBC 9.0  NEUTROABS 6.0  HGB 4.0*  HCT 15.1*  MCV 65.1*  PLT 369    Basename 05/14/12 1515  CKTOTAL --  CKMB --  CKMBINDEX --  TROPONINI <0.30   No results found for this basename: DDIMER:2 in the last 72 hours No components found with this basename: POCBNP:3   RADIOLOGIC STUDIES ON ADMISSION: Dg Chest Portable 1 View  05/14/2012  *RADIOLOGY REPORT*  Clinical Data: Shortness of breath.  PORTABLE CHEST - 1 VIEW  Comparison: 11/24/2011 CT chest.  Findings: The lungs are clear.  Heart is borderline in size.  There are no edematous changes.  There is no evidence for pneumothorax. There is chronic elevation of the right hemidiaphragm.  IMPRESSION: No acute findings.   Original Report Authenticated By: Rolla Plate, M.Whitney.     ASSESSMENT AND PLAN: Present on Admission:  .Anemia -Suspect secondary to subacute/chronic blood loss given the history of Cameron lesions and history of recent melena 3 weeks back. She also has had NSAID use in the past few  months -Will transfuse at least 2 units of PRBC, place on PPI. We'll keep n.p.o. post midnight. Have spoken with Dr. Arlyce Dice from  [facility], they would evaluate tomorrow for possible EGD  -Await iron studies-that were taken prior to PRBC being transfused.   .GERD/gastritis  -PPI  -EGD in a.m.   Marland KitchenDepression with Bipolar 1 disorder -Resume psych meds  . History of lung Cancer -Outpatient followup   Further plan will depend as patient's clinical course evolves and further radiologic and laboratory data become available. Patient will be monitored closely.  DVT Prophylaxis: -SCD's  Code Status: Full Code  Total time spent for admission equals 45 minutes.  [facility] Pager [phone]  If 7PM-7AM, please contact night-coverage [email or web address] Password [email or web address] 05/14/2012, 5:15 PM

## 2012-05-14 NOTE — ED Notes (Signed)
Hot line used due to pt hemoglobin and pt stated she was cold.

## 2012-05-14 NOTE — Progress Notes (Addendum)
Disposition Note  Megan Whitney, is a 56 y.o. female,   MRN: [id number]  -  DOB - 27-Oct-1955  Outpatient Primary MD for the patient is POLITE,RONALD D, MD   Blood pressure 113/77, pulse 103, temperature 98.4 F (36.9 C), temperature source Oral, resp. rate 16, SpO2 97.00%.  Active Problems:  Anemia  GERD  Personal history of colonic polyps  Cancer   56 yo female feeling poorly for 2 weeks was sent to the ED from Dr. Ferne Reus Polite's office for a hgb of 4.0.  She's had some intermittent epigastric pain, nausea, vomiting.  No obvious hematemesis, hematochezia, or melena.  Previous EGD (Dr. Yancey Flemings) for anemia showed possible cameron lesions.    There was no stool in the vault - the patient was guiac negative on EDP exam.  EDP is ordering ferritin, TIBC, B12 etc., then transfusing 2 units.  I have requested a tele bed given her dangerously low hgb.  I've left a message for [facility] [place] to assume her care tomorrow morning.   Algis Downs, PA-C [facility] Pager: [phone]

## 2012-05-14 NOTE — ED Provider Notes (Signed)
History     CSN: [id number]  Arrival date & time 05/14/12  1332   First MD Initiated Contact with Patient 05/14/12 1456      Chief Complaint  Patient presents with  . Shortness of Breath  . Abnormal Lab    (Consider location/radiation/quality/duration/timing/severity/associated sxs/prior treatment) HPI Comments: Patient presents with 2 week history of gradual onset shortness of breath, dizziness, dyspnea on exertion, nausea, vomiting. She denies any chest pain, cough or fever she saw her primary care Dr. polite today and found him a hemoglobin of 4. She is a history of iron deficiency anemia. He is not required blood transfusion in the past. EGD last year showed gastritis. She denies any blood in her emesis or stool. She's a good by mouth intake and urine output. She has had progressive fatigue and generalized weakness. Patient also had intermittent epigastric pain. She denies any excessive alcohol or NSAID use.  Patient is a 56 y.o. female presenting with shortness of breath. The history is provided by the patient.  Shortness of Breath  Associated symptoms include shortness of breath. Pertinent negatives include no chest pain, no fever and no rhinorrhea.    Past Medical History  Diagnosis Date  . Anemia   . Bipolar 1 disorder   . Depression   . Cancer   . Shortness of breath   . Anginal pain   . Sleep apnea   . GERD (gastroesophageal reflux disease)   . Headache   . Arthritis     Past Surgical History  Procedure Date  . Wedge resection   . Joint replacement   . Total hip arthroplasty     bilateral  . Tubal ligation     History reviewed. No pertinent family history.  History  Substance Use Topics  . Smoking status: Never Smoker   . Smokeless tobacco: Never Used  . Alcohol Use: 0.6 oz/week    1 Glasses of wine, 0 Cans of beer, 0 Shots of liquor, 0 Drinks containing 0.5 oz of alcohol per week    OB History    Grav Para Term Preterm Abortions TAB SAB Ect Mult  Living                  Review of Systems  Constitutional: Positive for activity change, appetite change and fatigue. Negative for fever.  HENT: Negative for congestion and rhinorrhea.   Eyes: Negative for photophobia and visual disturbance.  Respiratory: Positive for shortness of breath.   Cardiovascular: Negative for chest pain.  Gastrointestinal: Positive for nausea and vomiting. Negative for abdominal pain and blood in stool.  Genitourinary: Negative for dysuria and hematuria.  Musculoskeletal: Negative for back pain.  Skin: Positive for pallor. Negative for rash.  Neurological: Positive for dizziness, weakness and light-headedness.    Allergies  Azithromycin and Penicillins  Home Medications  No current outpatient prescriptions on file.  BP 103/69  Pulse 87  Temp 98.9 F (37.2 C) (Oral)  Resp 22  Ht 5' (1.524 m)  Wt 227 lb 8.2 oz (103.2 kg)  BMI 44.43 kg/m2  SpO2 99%  Physical Exam  Constitutional: She appears well-developed and well-nourished. No distress.       pale  HENT:  Head: Normocephalic and atraumatic.  Mouth/Throat: Oropharynx is clear and moist. No oropharyngeal exudate.  Eyes: Conjunctivae and EOM are normal. Pupils are equal, round, and reactive to light.       Conjunctival pallor  Neck: Normal range of motion. Neck supple.  Cardiovascular: Normal rate, regular  rhythm and normal heart sounds.   Pulmonary/Chest: Effort normal and breath sounds normal. No respiratory distress.  Abdominal: Soft. There is no tenderness. There is no rebound and no guarding.  Genitourinary: Guaiac negative stool.       No hemorrhoids or fissures  Skin: Skin is warm. There is pallor.    ED Course  Procedures (including critical care time)  Labs Reviewed  CBC WITH DIFFERENTIAL - Abnormal; Notable for the following:    RBC 2.32 (*)     Hemoglobin 4.0 (*)     HCT 15.1 (*)     MCV 65.1 (*)     MCH 17.2 (*)     MCHC 26.5 (*)     RDW 18.6 (*)     Monocytes Absolute  1.1 (*)     All other components within normal limits  COMPREHENSIVE METABOLIC PANEL - Abnormal; Notable for the following:    Potassium 3.4 (*)     Glucose, Bld 143 (*)     Albumin 3.4 (*)     GFR calc non Af Amer 73 (*)     GFR calc Af Amer 85 (*)     All other components within normal limits  RETICULOCYTES - Abnormal; Notable for the following:    Retic Ct Pct 3.5 (*)     RBC. 2.14 (*)     All other components within normal limits  TYPE AND SCREEN  PROTIME-INR  TROPONIN I  PREPARE RBC (CROSSMATCH)  ABO/RH  PREPARE RBC (CROSSMATCH)  VITAMIN B12  FOLATE  IRON AND TIBC  FERRITIN  CBC  PROTIME-INR  BASIC METABOLIC PANEL   Dg Chest Portable 1 View  05/14/2012  *RADIOLOGY REPORT*  Clinical Data: Shortness of breath.  PORTABLE CHEST - 1 VIEW  Comparison: 11/24/2011 CT chest.  Findings: The lungs are clear.  Heart is borderline in size.  There are no edematous changes.  There is no evidence for pneumothorax. There is chronic elevation of the right hemidiaphragm.  IMPRESSION: No acute findings.   Original Report Authenticated By: Rolla Plate, M.D.      1. Anemia   2. DOE (dyspnea on exertion)   3. Bipolar 1 disorder   4. Cancer   5. Depression       MDM  History on exertion, shortness of breath, history of anemia. Vitals stable, mild tachycardia, abdomen soft and nontender.  No stool in rectal vault, no evidence of active bleeding. Hb 4.  Anemia panel ordered. Transfuse 2 units blood, IVF, protonix. Admission for resuscitation and GI evaluation in the morning  EGD 2012: ENDOSCOPIC IMPRESSION:  1) Esophagitis in the distal esophagus  2) Cameron's lesions likely cause for iron deficiency anemia  3) Duodenitis in the bulb of duodenum  4) GERD     Date: 05/14/2012  Rate: 81  Rhythm: normal sinus rhythm  QRS Axis: normal  Intervals: normal  ST/T Wave abnormalities: normal  Conduction Disutrbances:none  Narrative Interpretation:   Old EKG Reviewed:  unchanged  CRITICAL CARE Performed by: Glynn Octave   Total critical care time: 30  Critical care time was exclusive of separately billable procedures and treating other patients.  Critical care was necessary to treat or prevent imminent or life-threatening deterioration.  Critical care was time spent personally by me on the following activities: development of treatment plan with patient and/or surrogate as well as nursing, discussions with consultants, evaluation of patient's response to treatment, examination of patient, obtaining history from patient or surrogate, ordering and performing treatments and  interventions, ordering and review of laboratory studies, ordering and review of radiographic studies, pulse oximetry and re-evaluation of patient's condition.   Glynn Octave, MD 05/14/12 [phone]

## 2012-05-14 NOTE — ED Notes (Signed)
Pt here for severe SOB. Dr. Rhina Brackett her here for hgb of 4. Pt very pale

## 2012-05-15 ENCOUNTER — Encounter (HOSPITAL_COMMUNITY): Payer: Self-pay | Admitting: Physician Assistant

## 2012-05-15 DIAGNOSIS — K219 Gastro-esophageal reflux disease without esophagitis: Secondary | ICD-10-CM

## 2012-05-15 DIAGNOSIS — D649 Anemia, unspecified: Secondary | ICD-10-CM

## 2012-05-15 DIAGNOSIS — D509 Iron deficiency anemia, unspecified: Principal | ICD-10-CM

## 2012-05-15 DIAGNOSIS — R1310 Dysphagia, unspecified: Secondary | ICD-10-CM

## 2012-05-15 LAB — CBC
HCT: 23.5 % — ABNORMAL LOW (ref 36.0–46.0)
MCHC: 30.6 g/dL (ref 30.0–36.0)
MCV: 71.9 fL — ABNORMAL LOW (ref 78.0–100.0)
Platelets: 282 10*3/uL (ref 150–400)
RDW: 21.2 % — ABNORMAL HIGH (ref 11.5–15.5)
WBC: 9.3 10*3/uL (ref 4.0–10.5)

## 2012-05-15 LAB — BASIC METABOLIC PANEL
CO2: 25 mEq/L (ref 19–32)
Calcium: 8.7 mg/dL (ref 8.4–10.5)
Creatinine, Ser: 0.77 mg/dL (ref 0.50–1.10)
GFR calc non Af Amer: >90 mL/min (ref >90)
Glucose, Bld: 137 mg/dL — ABNORMAL HIGH (ref 70–99)
Sodium: 136 mEq/L (ref 135–145)

## 2012-05-15 LAB — VITAMIN B12: Vitamin B-12: 538 pg/mL (ref 211–911)

## 2012-05-15 LAB — FERRITIN: Ferritin: 2 ng/mL — ABNORMAL LOW (ref 10–291)

## 2012-05-15 LAB — IRON AND TIBC
Iron: <10 ug/dL — ABNORMAL LOW (ref 42–135)
UIBC: 452 ug/dL — ABNORMAL HIGH (ref 125–400)

## 2012-05-15 MED ORDER — PANTOPRAZOLE SODIUM 40 MG PO TBEC
40.0000 mg | DELAYED_RELEASE_TABLET | Freq: Every day | ORAL | Status: DC
  Administered 2012-05-15 – 2012-05-17 (×2): 40 mg via ORAL
  Filled 2012-05-15 (×2): qty 1

## 2012-05-15 NOTE — Progress Notes (Signed)
Subjective: Admission H&P reviewed. Patient directed To go to the emergency room when labs reveal critical hemoglobin of 4. She had used  NSAIDs, had been having some nausea and vomiting. Several weeks ago admitted to having  a tarry stool. She's had an EGD in the past which revealed Cameron's lesion and gastritis gastritis.Patient states she feels much better after transfusion. No chest pain no shortness of breath. Followup hemoglobin pending. Apparently GI has already been notified, and there are plans for endoscopy. There's been no bright red blood per rectum since admission, no nausea no vomiting no abdominal pain. Nurses note has been reviewed in regards to patient's underlying psychiatric issues. Clinically patient is stable and follows closely with her psychiatrist.  Objective: Vital signs in last 24 hours: Temp:  [97.6 F (36.4 C)-98.9 F (37.2 C)] 98.4 F (36.9 C) (09/10 0724) Pulse Rate:  [75-105] 85  (09/10 0724) Resp:  [16-30] 18  (09/10 0724) BP: (88-121)/(44-87) 115/76 mmHg (09/10 0724) SpO2:  [97 %-100 %] 99 % (09/10 0724) FiO2 (%):  [28 %] 28 % (09/09 1945) Weight:  [103.2 kg (227 lb 8.2 oz)] 103.2 kg (227 lb 8.2 oz) (09/09 1831) Weight change:  Last BM Date: 05/14/12  Intake/Output from previous day: 09/09 0701 - 09/10 0700 In: 2070 [I.V.:1010; Blood:1050; IV Piggyback:10] Out: 250 [Urine:250] Intake/Output this shift: Total I/O In: -  Out: 200 [Urine:200]  General appearance: alert and cooperative Resp: clear to auscultation bilaterally Cardio: regular rate and rhythm, S1, S2 normal, no murmur, click, rub or gallop GI: soft, non-tender; bowel sounds normal; no masses,  no organomegaly Extremities: extremities normal, atraumatic, no cyanosis or edema  Lab Results:  Results for orders placed during the hospital encounter of 05/14/12 (from the past 24 hour(s))  CBC WITH DIFFERENTIAL     Status: Abnormal   Collection Time   05/14/12  2:04 PM      Component Value  Range   WBC 9.0  4.0 - 10.5 K/uL   RBC 2.32 (*) 3.87 - 5.11 MIL/uL   Hemoglobin 4.0 (*) 12.0 - 15.0 g/dL   HCT 16.1 (*) 09.6 - 04.5 %   MCV 65.1 (*) 78.0 - 100.0 fL   MCH 17.2 (*) 26.0 - 34.0 pg   MCHC 26.5 (*) 30.0 - 36.0 g/dL   RDW 40.9 (*) 81.1 - 91.4 %   Platelets 369  150 - 400 K/uL   Neutrophils Relative 67  43 - 77 %   Lymphocytes Relative 20  12 - 46 %   Monocytes Relative 12  3 - 12 %   Eosinophils Relative 1  0 - 5 %   Basophils Relative 0  0 - 1 %   Neutro Abs 6.0  1.7 - 7.7 K/uL   Lymphs Abs 1.8  0.7 - 4.0 K/uL   Monocytes Absolute 1.1 (*) 0.1 - 1.0 K/uL   Eosinophils Absolute 0.1  0.0 - 0.7 K/uL   Basophils Absolute 0.0  0.0 - 0.1 K/uL   RBC Morphology POLYCHROMASIA PRESENT    COMPREHENSIVE METABOLIC PANEL     Status: Abnormal   Collection Time   05/14/12  2:04 PM      Component Value Range   Sodium 135  135 - 145 mEq/L   Potassium 3.4 (*) 3.5 - 5.1 mEq/L   Chloride 99  96 - 112 mEq/L   CO2 24  19 - 32 mEq/L   Glucose, Bld 143 (*) 70 - 99 mg/dL   BUN 17  6 -  23 mg/dL   Creatinine, Ser 4.78  0.50 - 1.10 mg/dL   Calcium 9.2  8.4 - 29.5 mg/dL   Total Protein 6.5  6.0 - 8.3 g/dL   Albumin 3.4 (*) 3.5 - 5.2 g/dL   AST 21  0 - 37 U/L   ALT 19  0 - 35 U/L   Alkaline Phosphatase 67  39 - 117 U/L   Total Bilirubin 0.3  0.3 - 1.2 mg/dL   GFR calc non Af Amer 73 (*) >90 mL/min   GFR calc Af Amer 85 (*) >90 mL/min  TYPE AND SCREEN     Status: Normal (Preliminary result)   Collection Time   05/14/12  2:13 PM      Component Value Range   ABO/RH(D) A POS     Antibody Screen NEG     Sample Expiration 05/17/2012     Unit Number [id number]     Blood Component Type RED CELLS,LR     Unit division 00     Status of Unit ISSUED,FINAL     Transfusion Status OK TO TRANSFUSE     Crossmatch Result Compatible     Unit Number [id number]     Blood Component Type RED CELLS,LR     Unit division 00     Status of Unit ISSUED     Transfusion Status OK TO TRANSFUSE      Crossmatch Result Compatible     Unit Number [id number]     Blood Component Type RED CELLS,LR     Unit division 00     Status of Unit ISSUED     Transfusion Status OK TO TRANSFUSE     Crossmatch Result Compatible    PREPARE RBC (CROSSMATCH)     Status: Normal   Collection Time   05/14/12  2:13 PM      Component Value Range   Order Confirmation ORDER PROCESSED BY BLOOD BANK    ABO/RH     Status: Normal   Collection Time   05/14/12  2:13 PM      Component Value Range   ABO/RH(D) A POS    PROTIME-INR     Status: Normal   Collection Time   05/14/12  3:14 PM      Component Value Range   Prothrombin Time 14.0  11.6 - 15.2 seconds   INR 1.06  0.00 - 1.49  TROPONIN I     Status: Normal   Collection Time   05/14/12  3:15 PM      Component Value Range   Troponin I <0.30  <0.30 ng/mL  VITAMIN B12     Status: Normal   Collection Time   05/14/12  4:01 PM      Component Value Range   Vitamin B-12 538  211 - 911 pg/mL  FOLATE     Status: Normal   Collection Time   05/14/12  4:01 PM      Component Value Range   Folate >20.0    IRON AND TIBC     Status: Abnormal   Collection Time   05/14/12  4:01 PM      Component Value Range   Iron <10 (*) 42 - 135 ug/dL   TIBC Not calculated due to Iron <10.  250 - 470 ug/dL   Saturation Ratios Not calculated due to Iron <10.  20 - 55 %   UIBC 452 (*) 125 - 400 ug/dL  FERRITIN     Status: Abnormal   Collection Time  05/14/12  4:01 PM      Component Value Range   Ferritin 2 (*) 10 - 291 ng/mL  RETICULOCYTES     Status: Abnormal   Collection Time   05/14/12  4:01 PM      Component Value Range   Retic Ct Pct 3.5 (*) 0.4 - 3.1 %   RBC. 2.14 (*) 3.87 - 5.11 MIL/uL   Retic Count, Manual 74.9  19.0 - 186.0 K/uL  PREPARE RBC (CROSSMATCH)     Status: Normal   Collection Time   05/14/12  5:09 PM      Component Value Range   Order Confirmation ORDER PROCESSED BY BLOOD BANK    GLUCOSE, CAPILLARY     Status: Normal   Collection Time   05/15/12  7:49 AM       Component Value Range   Glucose-Capillary 88  70 - 99 mg/dL      Studies/Results: Dg Chest Portable 1 View  05/14/2012  *RADIOLOGY REPORT*  Clinical Data: Shortness of breath.  PORTABLE CHEST - 1 VIEW  Comparison: 11/24/2011 CT chest.  Findings: The lungs are clear.  Heart is borderline in size.  There are no edematous changes.  There is no evidence for pneumothorax. There is chronic elevation of the right hemidiaphragm.  IMPRESSION: No acute findings.   Original Report Authenticated By: Rolla Plate, M.D.     Medications:  Prior to Admission:  Prescriptions prior to admission  Medication Sig Dispense Refill  . ARIPiprazole (ABILIFY) 5 MG tablet Take 2.5 mg by mouth daily.      Marland Kitchen FLUOXETINE HCL PO Take 60 mg by mouth daily.      Marland Kitchen lamoTRIgine (LAMICTAL) 100 MG tablet Take 100 mg by mouth daily.      . Multiple Vitamin (MULTIVITAMIN WITH MINERALS) TABS Take 1 tablet by mouth daily.      . naproxen sodium (ANAPROX) 220 MG tablet Take 220 mg by mouth 2 (two) times daily as needed. For pain      . rOPINIRole (REQUIP) 0.25 MG tablet Take 0.25 mg by mouth 3 (three) times daily.       Scheduled:   . acetaminophen  650 mg Oral Once  . ARIPiprazole  2.5 mg Oral Daily  . diphenhydrAMINE  25 mg Oral Once  . FLUoxetine  40 mg Oral Daily  . furosemide  20 mg Intravenous Once  . lamoTRIgine  100 mg Oral Daily  . multivitamin with minerals  1 tablet Oral Daily  . ondansetron (ZOFRAN) IV  4 mg Intravenous Once  . pantoprazole  40 mg Oral Q1200  . pantoprazole (PROTONIX) IV  40 mg Intravenous Once  . sodium chloride  1,000 mL Intravenous Once  . sodium chloride  3 mL Intravenous Q12H  . DISCONTD: sodium chloride   Intravenous STAT  . DISCONTD: pantoprazole (PROTONIX) IV  40 mg Intravenous Q12H   Continuous:   . DISCONTD: sodium chloride 1,000 mL (05/14/12 1606)  . DISCONTD: sodium chloride 75 mL/hr at 05/15/12 0500    Assessment/Plan: Critical anemia probably secondary to subacute GI  bleed, patient is status post transfusion, continue PPI, avoid NSAIDs, await GI input. Resume iron when stable Bipolar History of lung cancer, status post resection in the past. Recent CT of the chest March 2013 unremarkable  LOS: 1 day   Lin Hackmann D 05/15/2012, 9:46 AM

## 2012-05-15 NOTE — Progress Notes (Signed)
[facility] Consult: 9:40 AM 05/15/2012   Referring Provider:  Dr Jerral Ralph Primary Care Physician:  Katy Apo, MD Primary Gastroenterologist:  Dr. Yancey Flemings  Reason for Consultation:  Recurrent severe iron defficiency anemia.   HPI: Megan Whitney is a 56 y.o. female.  56 year old white female with obesity, hyperlipidemia, bipolar disorder, adenomatous colon polyps, GERD, and 2002 resection of lung cancer felt to be cured.  Seen Oct 2012 for symptomatic Iron def anemia with Hgb around 6 to 7.   Jan 2012 EGD showed Camerons lesions as source of chronic blood loss and anemia.  Also had distal esophagitis and duodenitis. Jan 2012 Colonoscopy showed petite transverse polyp (no pathology report I called), sigmoid tics.  She was to stay on Iron supplements and daily PPI. Colon polyps in 2002 (hyperplastic), none in 2009.  Admitted 05/14/12 with Hgb of 4.0, MCV 65, ferritin 2.0.  Has been transfused 3 units red cells. BUN is normal.  PTA meds list Naproxen but neither PPI or Iron. Takes 3 aleve for hip pain per week.  Stopped Iron and PPI more than 4 months ago, propably longer than that.  Did not tolerate po Iron, caused GI epigastric burning.  For at least 2 weeks, 2 to 3 episodes of vomitting daily post po's.  vomitting especially triggerred by gagging when brushing teeth.  Occurs 30 to 60 minutes post prandial.  No coffee grounds or blood.  No dark or bloody stools or change in stool patterns.  Weak and dizzy, DOE all present for at least 2 weeks.  No weight loss. No anorexia. No exertional chest pain, no palpitations.  No dysphagia.    Past Medical History  Diagnosis Date  . Anemia     chroniv blood loss anemia felt due to camerons lesions in esophagus.   . Bipolar 1 disorder   . Depression   . Lung cancer 2002  . Shortness of breath   . Anginal pain   . Sleep apnea   . GERD (gastroesophageal reflux disease)   . Headache   .  Arthritis   . Colon polyps 2002, 2009    hyperplastic 2002, no path from 2012.   Marland Kitchen Hyperlipidemia     Past Surgical History  Procedure Date  . Wedge resection 2002    lung cancer  . Joint replacement   . Total hip arthroplasty     bilateral  . Tubal ligation     Prior to Admission medications   Medication Sig Start Date End Date Taking? Authorizing Provider  ARIPiprazole (ABILIFY) 5 MG tablet Take 2.5 mg by mouth daily.   Yes Historical Provider, MD  FLUOXETINE HCL PO Take 60 mg by mouth daily.   Yes Historical Provider, MD  lamoTRIgine (LAMICTAL) 100 MG tablet Take 100 mg by mouth daily.   Yes Historical Provider, MD  Multiple Vitamin (MULTIVITAMIN WITH MINERALS) TABS Take 1 tablet by mouth daily.   Yes Historical Provider, MD  naproxen sodium (ANAPROX) 220 MG tablet Take 220 mg by mouth 2 (two) times daily as needed. For pain   Yes Historical Provider, MD  rOPINIRole (REQUIP) 0.25 MG tablet Take 0.25 mg by mouth 3 (three) times daily.   Yes Historical Provider, MD    Scheduled Meds:    . acetaminophen  650 mg Oral Once  . ARIPiprazole  2.5 mg Oral Daily  . diphenhydrAMINE  25 mg Oral Once  . FLUoxetine  40 mg Oral Daily  . furosemide  20 mg Intravenous Once  .  lamoTRIgine  100 mg Oral Daily  . multivitamin with minerals  1 tablet Oral Daily  . ondansetron (ZOFRAN) IV  4 mg Intravenous Once  . pantoprazole  40 mg Oral Q1200  . pantoprazole (PROTONIX) IV  40 mg Intravenous Once  . sodium chloride  1,000 mL Intravenous Once  . sodium chloride  3 mL Intravenous Q12H   PRN Meds: acetaminophen, acetaminophen, albuterol, alum & mag hydroxide-simeth, ondansetron (ZOFRAN) IV, ondansetron, DISCONTD: ondansetron (ZOFRAN) IV   Allergies as of 05/14/2012 - Review Complete 05/14/2012  Allergen Reaction Noted  . Azithromycin Anaphylaxis   . Penicillins Anaphylaxis     History family.  Ulcers, anemia and bleeding in mother.  No colon or stomach cancer  History   Social  History  . Marital Status: Married    Spouse Name: N/A    Number of Children: N/A  . Years of Education: N/A   Occupational History  . Receptionist   Social History Main Topics  . Smoking status: Never Smoker   . Smokeless tobacco: Never Used  . Alcohol Use: 0.6 oz/week    1 Glasses of wine, 0 Cans of beer, 0 Shots of liquor, 0 Drinks containing 0.5 oz of alcohol per week  . Drug Use: No  . Sexually Active:      REVIEW OF SYSTEMS: Difficulty concentrating.  No blurry vision.  Chronic UE tremor from Abilify, MD is weaning her off this slowly. No joint swelling.  Hip pain. No hair loss.  No sweats or chills Urinary incontinence and urgency.  Nocturia 3 times per night.  No blood in urine No rash or sores, no itching.  No falls.  No nose bleeds. Otherwise 15 system reviewed were negative   PHYSICAL EXAM: Vital signs in last 24 hours: Temp:  [97.6 F (36.4 C)-98.9 F (37.2 C)] 98.4 F (36.9 C) (09/10 0724) Pulse Rate:  [75-105] 85  (09/10 0724) Resp:  [16-30] 18  (09/10 0724) BP: (88-121)/(44-87) 115/76 mmHg (09/10 0724) SpO2:  [97 %-100 %] 99 % (09/10 0724) FiO2 (%):  [28 %] 28 % (09/09 1945) Weight:  [227 lb 8.2 oz (103.2 kg)] 227 lb 8.2 oz (103.2 kg) (09/09 1831)  General: pleasant, overweight, well apearing WF Eyes:  No pallor or icterus.  EOMI Ears:  No HOH  Nose:  No congestion Mouth:  Pink, clear mucosa.  Good dental repair Neck:  No JVD or bruits.  No masses or TMG Lungs:  Some crackles in left base Heart: RRR.  No MRG.  s1 and s2 audible Abdomen:  Soft, obese, NT.  Active BS.  No mass or HSM.   Rectal: not repeated.  FOB negative in ED   Musc/Skeltl: no joint redness or swelling.  Extremities:  No  Pedal edema.  Feet warm  Neurologic:  Pleasant, UE tremor, moves all 4s.  No gross weakness.  Fully oriented.  Good historian.  Skin:  No rash, sores or purpura Tattoos:  none Nodes:  No cervical or inguinal adenopathy   Psych:  Peasant, cooperative, not  anxious or depressed.   LAB RESULTS:  Basename 05/14/12 1404  WBC 9.0  HGB 4.0*  HCT 15.1*  PLT 369   BMET Lab Results  Component Value Date   NA 135 05/14/2012   K 3.4* 05/14/2012   CL 99 05/14/2012   CO2 24 05/14/2012   GLUCOSE 143* 05/14/2012   BUN 17 05/14/2012   CREATININE 0.87 05/14/2012   CALCIUM 9.2 05/14/2012   LFT  Basename 05/14/12 1404  PROT 6.5  ALBUMIN 3.4*  AST 21  ALT 19  ALKPHOS 67  BILITOT 0.3  BILIDIR --  IBILI --   PT/INR Lab Results  Component Value Date   INR 1.06 05/14/2012   RADIOLOGY STUDIES: Dg Chest Portable 1 View 05/14/2012  *RADIOLOGY REPORT*  Clinical Data: Shortness of breath.  PORTABLE CHEST - 1 VIEW  Comparison: 11/24/2011 CT chest.  Findings: The lungs are clear.  Heart is borderline in size.  There are no edematous changes.  There is no evidence for pneumothorax. There is chronic elevation of the right hemidiaphragm.  IMPRESSION: No acute findings.   Original Report Authenticated By: Rolla Plate, M.D.     ENDOSCOPIC STUDIES: Noted in HPI  IMPRESSION: *  Recurrent Iron def anemia.  Probably from cameron erosions and or erosive esophagitis/gastritis related slow blood loss.  Takes aleve so this may be causing ulcers. *  Blood glucose 140's , no hx DM  PLAN: *  EGD on 05/16/12.  OK to feed her today.  Does not need a colonoscopy.  *  D/C tele,  Await repeat CBC, po protonix *  Would give parenteral Iron infusion.  Can trial another formulation on PO Iron, see if she tolerates it, if not needs regular parenteral Iron infusions overseen by her primary MD or by hematologist.  Her oncologist has been Dr Truett Perna in the past.     LOS: 1 day   Jennye Moccasin  05/15/2012, 9:40 AM Pager: [phone]

## 2012-05-15 NOTE — Progress Notes (Addendum)
Informed Dr. Earl Gala that patient had disclosed to day shift nurse during admission questionare that she had suicidal ideations about 30 days ago.  Pt discussed plan with psychiatrist and adjustments were made to medications.  Also stated that pt had attempted to "cut" her wrists about 2 months ago.  Dr. Earl Gala did not feel she needed to be on suicide precautions at this time.  Will continue to monitor.

## 2012-05-15 NOTE — Progress Notes (Signed)
Chart was reviewed and patient was examined. X-rays were reviewed.   Patient has a paucity of GI complaints except for dysphagia to solids and intermittent pyrosis. She thinks she had very dark stools several weeks ago. She is taking NSAIDs.  Chronic GI blood loss could be due to recurrent Cameron erosions. Active ulcers anywhere along the GI tract is possible in view of NSAID use. AVMs are also a consideration.  Recommendations #1 upper endoscopy with dilatation as indicated. I will use a pediatric colonoscope to look further into the small bowel if no bleeding source is seen in the upper GI tract #2 hold NSAIDs #3 iron supplementation  Barbette Hair. Arlyce Dice, M.D., [facility] Cell [phone]

## 2012-05-15 NOTE — Progress Notes (Signed)
  Echocardiogram 2D Echocardiogram has been performed.  JAZZMON, PRINDLE 05/15/2012, 5:38 PM

## 2012-05-15 NOTE — Care Management Note (Signed)
    Page 1 of 1   05/17/2012     5:01:15 PM   CARE MANAGEMENT NOTE 05/17/2012  Patient:  Megan Whitney, Megan Whitney   Account Number:  [id number]  Date Initiated:  05/15/2012  Documentation initiated by:  Letha Cape  Subjective/Objective Assessment:   dx gib  admit- lives with spouse.     Action/Plan:   Anticipated DC Date:  05/17/2012   Anticipated DC Plan:  HOME/SELF CARE         Choice offered to / List presented to:             Status of service:  Completed, signed off Medicare Important Message given?   (If response is "NO", the following Medicare IM given date fields will be blank) Date Medicare IM given:   Date Additional Medicare IM given:    Discharge Disposition:  HOME/SELF CARE  Per UR Regulation:  Reviewed for med. necessity/level of care/duration of stay  If discussed at Long Length of Stay Meetings, dates discussed:    Comments:  05/17/12 17:00 Letha Cape RN, BSN [phone] pt dc to home.  05/15/12 16:20 Letha Cape RN,BSN [phone] patient lives with spouse, patient has medication coverage and transportation.  NCM will continue to follow for dc needs.

## 2012-05-16 ENCOUNTER — Encounter (HOSPITAL_COMMUNITY)
Admission: EM | Disposition: A | Discharge status: Home / Self Care | Payer: Self-pay | Source: Home / Self Care | Attending: Internal Medicine

## 2012-05-16 ENCOUNTER — Encounter (HOSPITAL_COMMUNITY): Payer: Self-pay | Admitting: Gastroenterology

## 2012-05-16 DIAGNOSIS — K449 Diaphragmatic hernia without obstruction or gangrene: Secondary | ICD-10-CM

## 2012-05-16 DIAGNOSIS — K222 Esophageal obstruction: Secondary | ICD-10-CM

## 2012-05-16 HISTORY — PX: BALLOON DILATION: SHX5330

## 2012-05-16 HISTORY — PX: ENTEROSCOPY: SHX5533

## 2012-05-16 LAB — CBC
MCH: 21.3 pg — ABNORMAL LOW (ref 26.0–34.0)
MCHC: 29.3 g/dL — ABNORMAL LOW (ref 30.0–36.0)
RDW: 22.1 % — ABNORMAL HIGH (ref 11.5–15.5)

## 2012-05-16 LAB — GLUCOSE, CAPILLARY: Glucose-Capillary: 101 mg/dL — ABNORMAL HIGH (ref 70–99)

## 2012-05-16 LAB — PREPARE RBC (CROSSMATCH)

## 2012-05-16 SURGERY — ENTEROSCOPY
Anesthesia: Moderate Sedation

## 2012-05-16 SURGERY — ENTEROSCOPY

## 2012-05-16 MED ORDER — MIDAZOLAM HCL 5 MG/5ML IJ SOLN
INTRAMUSCULAR | Status: DC | PRN
  Administered 2012-05-16 (×2): 2.5 mg via INTRAVENOUS

## 2012-05-16 MED ORDER — GLYCOPYRROLATE 0.2 MG/ML IJ SOLN
INTRAMUSCULAR | Status: AC
  Filled 2012-05-16: qty 1

## 2012-05-16 MED ORDER — GLYCOPYRROLATE 0.2 MG/ML IJ SOLN
INTRAMUSCULAR | Status: DC | PRN
  Administered 2012-05-16: 0.2 mg via INTRAVENOUS

## 2012-05-16 MED ORDER — FENTANYL CITRATE 0.05 MG/ML IJ SOLN
INTRAMUSCULAR | Status: AC
  Filled 2012-05-16: qty 2

## 2012-05-16 MED ORDER — MIDAZOLAM HCL 5 MG/ML IJ SOLN
INTRAMUSCULAR | Status: AC
  Filled 2012-05-16: qty 2

## 2012-05-16 MED ORDER — FENTANYL CITRATE 0.05 MG/ML IJ SOLN
INTRAMUSCULAR | Status: DC | PRN
  Administered 2012-05-16 (×2): 20 ug via INTRAVENOUS
  Administered 2012-05-16: 25 ug via INTRAVENOUS

## 2012-05-16 MED ORDER — SODIUM CHLORIDE 0.9 % IV SOLN: INTRAVENOUS | Status: DC

## 2012-05-16 MED ORDER — INFLUENZA VIRUS VACC SPLIT PF IM SUSP
0.5000 mL | INTRAMUSCULAR | Status: AC
  Administered 2012-05-17: 0.5 mL via INTRAMUSCULAR
  Filled 2012-05-16: qty 0.5

## 2012-05-16 MED ORDER — BUTAMBEN-TETRACAINE-BENZOCAINE 2-2-14 % EX AERO
INHALATION_SPRAY | CUTANEOUS | Status: DC | PRN
  Administered 2012-05-16: 2 via TOPICAL

## 2012-05-16 NOTE — Interval H&P Note (Signed)
History and Physical Interval Note:  05/16/2012 3:03 PM  Megan Whitney  has presented today for surgery, with the diagnosis of anemia  The various methods of treatment have been discussed with the patient and family. After consideration of risks, benefits and other options for treatment, the patient has consented to  Procedure(s) (LRB) with comments: ESOPHAGOGASTRODUODENOSCOPY (EGD) (N/A) as a surgical intervention .  The patient's history has been reviewed, patient examined, no change in status, stable for surgery.  I have reviewed the patient's chart and labs.  Questions were answered to the patient's satisfaction.     The recent H&P (dated *05/15/12 **) was reviewed, the patient was examined and there is no change in the patients condition since that H&P was completed.   [name]  05/16/2012, 3:03 PM   [name]

## 2012-05-16 NOTE — Progress Notes (Signed)
1050 Patient gone to Endoscopy   Dawn ,RN in endo made aware of  Vital signs are due at 1125 a.m.

## 2012-05-16 NOTE — Progress Notes (Signed)
CRITICAL VALUE ALERT  Critical value received:  Hgb 6.8  Date of notification:  05/16/12  Time of notification:  0730  Critical value read back: yes  Nurse who received alert:  Jacquiline Doe, RN  MD notified (1st page):  Dr. Nehemiah Settle  Time of first page:  [phone]  Responding MD:  Dr. Valentina Lucks  Time MD responded:  [phone]

## 2012-05-16 NOTE — Progress Notes (Signed)
Upper endoscopy and enteroscopy were remarkable for an esophageal stricture and large hernia. No erosions were seen. The esophageal stricture was dilated.   Recommendations #1 continue PPI therapy #2 avoid NSAIDs #3 capsule endoscopy as outpatient if not already done

## 2012-05-16 NOTE — Progress Notes (Signed)
Subjective: No bleeding overnight, patient's hemoglobin 6.8. She felt very winded and fatigued upon getting up to go to the bathroom this morning. Plans for EGD today.  Objective: Vital signs in last 24 hours: Temp:  [98.2 F (36.8 C)-98.8 F (37.1 C)] 98.8 F (37.1 C) (09/11 0605) Pulse Rate:  [79-95] 95  (09/11 0605) Resp:  [18] 18  (09/11 0605) BP: (110-133)/(69-73) 119/72 mmHg (09/11 0605) SpO2:  [93 %-95 %] 95 % (09/11 0605) Weight change:  Last BM Date: 05/14/12  Intake/Output from previous day: 09/10 0701 - 09/11 0700 In: 580 [P.O.:580] Out: 550 [Urine:550] Intake/Output this shift:    General appearance: alert and cooperative Resp: clear to auscultation bilaterally Cardio: regular rate and rhythm, S1, S2 normal, no murmur, click, rub or gallop Extremities: extremities normal, atraumatic, no cyanosis or edema  Lab Results:  Results for orders placed during the hospital encounter of 05/14/12 (from the past 24 hour(s))  CBC     Status: Abnormal   Collection Time   05/15/12 10:16 AM      Component Value Range   WBC 9.3  4.0 - 10.5 K/uL   RBC 3.27 (*) 3.87 - 5.11 MIL/uL   Hemoglobin 7.2 (*) 12.0 - 15.0 g/dL   HCT 84.6 (*) 96.2 - 95.2 %   MCV 71.9 (*) 78.0 - 100.0 fL   MCH 22.0 (*) 26.0 - 34.0 pg   MCHC 30.6  30.0 - 36.0 g/dL   RDW 84.1 (*) 32.4 - 40.1 %   Platelets 282  150 - 400 K/uL  BASIC METABOLIC PANEL     Status: Abnormal   Collection Time   05/15/12 10:20 AM      Component Value Range   Sodium 136  135 - 145 mEq/L   Potassium 3.5  3.5 - 5.1 mEq/L   Chloride 103  96 - 112 mEq/L   CO2 25  19 - 32 mEq/L   Glucose, Bld 137 (*) 70 - 99 mg/dL   BUN 12  6 - 23 mg/dL   Creatinine, Ser 0.27  0.50 - 1.10 mg/dL   Calcium 8.7  8.4 - 25.3 mg/dL   GFR calc non Af Amer >90  >90 mL/min   GFR calc Af Amer >90  >90 mL/min  CBC     Status: Abnormal   Collection Time   05/16/12  6:05 AM      Component Value Range   WBC 11.0 (*) 4.0 - 10.5 K/uL   RBC 3.19 (*) 3.87 -  5.11 MIL/uL   Hemoglobin 6.8 (*) 12.0 - 15.0 g/dL   HCT 66.4 (*) 40.3 - 47.4 %   MCV 72.7 (*) 78.0 - 100.0 fL   MCH 21.3 (*) 26.0 - 34.0 pg   MCHC 29.3 (*) 30.0 - 36.0 g/dL   RDW 25.9 (*) 56.3 - 87.5 %   Platelets 289  150 - 400 K/uL  GLUCOSE, CAPILLARY     Status: Abnormal   Collection Time   05/16/12  7:13 AM      Component Value Range   Glucose-Capillary 101 (*) 70 - 99 mg/dL      Studies/Results: Dg Chest Portable 1 View  05/14/2012  *RADIOLOGY REPORT*  Clinical Data: Shortness of breath.  PORTABLE CHEST - 1 VIEW  Comparison: 11/24/2011 CT chest.  Findings: The lungs are clear.  Heart is borderline in size.  There are no edematous changes.  There is no evidence for pneumothorax. There is chronic elevation of the right hemidiaphragm.  IMPRESSION:  No acute findings.   Original Report Authenticated By: Rolla Plate, M.D.     Medications:  Prior to Admission:  Prescriptions prior to admission  Medication Sig Dispense Refill  . ARIPiprazole (ABILIFY) 5 MG tablet Take 2.5 mg by mouth daily.      Marland Kitchen FLUOXETINE HCL PO Take 60 mg by mouth daily.      Marland Kitchen lamoTRIgine (LAMICTAL) 100 MG tablet Take 100 mg by mouth daily.      . Multiple Vitamin (MULTIVITAMIN WITH MINERALS) TABS Take 1 tablet by mouth daily.      . naproxen sodium (ANAPROX) 220 MG tablet Take 220 mg by mouth 2 (two) times daily as needed. For pain      . rOPINIRole (REQUIP) 0.25 MG tablet Take 0.25 mg by mouth 3 (three) times daily.       Scheduled:   . ARIPiprazole  2.5 mg Oral Daily  . FLUoxetine  40 mg Oral Daily  . lamoTRIgine  100 mg Oral Daily  . multivitamin with minerals  1 tablet Oral Daily  . pantoprazole  40 mg Oral Q1200  . sodium chloride  3 mL Intravenous Q12H  . DISCONTD: pantoprazole (PROTONIX) IV  40 mg Intravenous Q12H   Continuous:   . sodium chloride    . sodium chloride    . DISCONTD: sodium chloride 75 mL/hr at 05/15/12 0500    Assessment/Plan: Symptomatic anemia probably from  slow/subacute GI bleed( upper), patient still symptomatic with ambulation, will transfuse one more unit of blood, plans were EGD today. Patient aware that she does need to take her iron, she is aware that if intolerant of oral iron IV iron will be required Bipolar stable History of lung cancer status post resection   LOS: 2 days   Makaiyah Schweiger D 05/16/2012, 8:30 AM

## 2012-05-16 NOTE — Progress Notes (Signed)
Pt's Hgb 6.8.  Informed Dr. Valentina Lucks.  No evidence of pt actively bleeding.  Dr. Valentina Lucks to see pt in AM.

## 2012-05-16 NOTE — Op Note (Signed)
[facility] [address] [place], [address]   OPERATIVE PROCEDURE REPORT  PATIENT: Megan Whitney, [name].  MR#: [id number] BIRTHDATE: 04-08-1956 , 56  yrs. old GENDER: Female ENDOSCOPIST: Louis Meckel, MD REFERRED BY: PROCEDURE DATE: 05/16/2012 PROCEDURE:   Diagnostic small bowel enteroscopy ASA CLASS:   Class II INDICATIONS:1.  iron deficiency anemia.   2.  dysphagia. MEDICATIONS: These medications were titrated to patient response per physician's verbal order, Fentanyl-Detailed 70 mcg IV, and Robinul 0.2 mg IV TOPICAL ANESTHETIC:   Cetacaine Spray  DESCRIPTION OF PROCEDURE:   After the risks benefits and alternatives of the procedure were thoroughly explained, informed consent was obtained.  The EG-[id number] ([id number]) and [id number] ([id number])  endoscope was introduced through the mouth  and advanced to the proximal jejunum jejunum , limited by Without limitations. The instrument was slowly withdrawn as the mucosa was fully examined.    A stricture was found in the gastroesophageal junction. Do is a moderate stricture. Balloon dilatation was performed.  number is 16.5 mm and 18 mm balloon dilators were inflated for 30 seconds each. Was moderate resistance to the 18 mm dilator. No heme was found. A hiatal hernia was found.  the hila hernia measured 5-6 cm. Remainder of the examination was normal including the jejunum stomach and more proximal esophagus. No fresh or old blood was seen Retroflexed views revealed no abnormalities.    The scope was then withdrawn from the patient and the procedure terminated.  COMPLICATIONS: There were no complications. ENDOSCOPIC IMPRESSION: 1.   A stricture was found in the gastroesophageal junction - status post balloon dilatation 2.   A hiatal hernia was found  RECOMMENDATIONS: #1 avoid NSAIDs #2 continue PPI therapy #3 capsule endoscopy  REPEAT EXAM:  _______________________________ eSignedLouis Meckel, MD 05/16/2012 3:38 PM   QM:VHQI Marina Goodell, MD  PATIENT NAME:  Megan Whitney, [name]. MR#: [id number]

## 2012-05-16 NOTE — OR Nursing (Signed)
Additional versed 2mg  at 1512 and fentanyl at 1512 during egd.

## 2012-05-17 ENCOUNTER — Encounter (HOSPITAL_COMMUNITY): Payer: Self-pay | Admitting: Gastroenterology

## 2012-05-17 ENCOUNTER — Other Ambulatory Visit: Payer: Self-pay | Admitting: Internal Medicine

## 2012-05-17 ENCOUNTER — Encounter (HOSPITAL_COMMUNITY): Payer: Self-pay

## 2012-05-17 ENCOUNTER — Telehealth: Payer: Self-pay | Admitting: Internal Medicine

## 2012-05-17 DIAGNOSIS — K222 Esophageal obstruction: Secondary | ICD-10-CM

## 2012-05-17 DIAGNOSIS — D649 Anemia, unspecified: Secondary | ICD-10-CM

## 2012-05-17 LAB — HEMOGLOBIN AND HEMATOCRIT, BLOOD: HCT: 28.4 % — ABNORMAL LOW (ref 36.0–46.0)

## 2012-05-17 LAB — TYPE AND SCREEN
Antibody Screen: NEGATIVE
Unit division: 0
Unit division: 0

## 2012-05-17 MED ORDER — FERROUS FUMARATE 325 (106 FE) MG PO TABS: 1.0000 | ORAL_TABLET | Freq: Two times a day (BID) | ORAL | Status: DC

## 2012-05-17 MED ORDER — FERROUS FUMARATE 325 (106 FE) MG PO TABS
1.0000 | ORAL_TABLET | Freq: Two times a day (BID) | ORAL | Status: DC
  Administered 2012-05-17: 106 mg via ORAL
  Filled 2012-05-17 (×2): qty 1

## 2012-05-17 MED ORDER — PANTOPRAZOLE SODIUM 40 MG PO TBEC: 40.0000 mg | DELAYED_RELEASE_TABLET | Freq: Every day | ORAL | Status: DC

## 2012-05-17 NOTE — Discharge Summary (Addendum)
Physician Discharge Summary  Patient ID: Megan Whitney MRN: [id number] DOB/AGE: 56-Jul-1957 56 y.o.  Admit date: 05/14/2012 Discharge date: 05/17/2012  Admission Diagnoses: Symptomatic anemia, iron deficient Discharge Diagnoses:  Principal Problem:  *GERD Active Problems:  Personal history of colonic polyps  Cancer  Depression  Bipolar 1 disorder  Anemia  Dysphagia, unspecified  Stricture and stenosis of esophagus  Hiatal hernia   Discharged Condition: stable  Hospital Course:  Patient presented to the hospital after outpatient labs revealed critical hemoglobin of 4. This was confirmed in the hospital. Patient gave a history of NSAID use, remote history of dark stool dyspnea and fatigue. previous EGD revealed Cameron's lesion, esophagitis and duodenitis, previous colonoscopy fairly unremarkable except for sigmoid diverticulosis. There was concern that she may have had a subacute upper GI bleed. She was admitted to a telemetry floor bed, transfused with packed red blood cells, after 3 units hemoglobin was 6.8, a fourth unit was given, hemoglobin was 8.5. Iron studies were consistent with iron deficient anemia. Patient was seen by GI, underwent endoscopy, she had a stricture which was dilated however no bleeding source was found. There's been no bleeding while hospitalized, she ambulates without difficulty. There are plans for outpatient capsule endoscopy. In the interim patient will take oral iron, she is aware that if she cannot tolerate oral iron that IV iron  will be indicated.  Consults: Treatment Team:  Louis Meckel, MD  Significant Diagnostic Studies:Dg Chest Portable 1 View  05/14/2012  *RADIOLOGY REPORT*  Clinical Data: Shortness of breath.  PORTABLE CHEST - 1 VIEW  Comparison: 11/24/2011 CT chest.  Findings: The lungs are clear.  Heart is borderline in size.  There are no edematous changes.  There is no evidence for pneumothorax. There is chronic elevation of the right  hemidiaphragm.  IMPRESSION: No acute findings.   Original Report Authenticated By: Rolla Plate, M.D.       Discharge Exam: Blood pressure 162/76, pulse 82, temperature 98.3 F (36.8 C), temperature source Oral, resp. rate 16, height 5' (1.524 m), weight 103.2 kg (227 lb 8.2 oz), SpO2 95.00%. General appearance: alert and cooperative Resp: clear to auscultation bilaterally Cardio: regular rate and rhythm, S1, S2 normal, no murmur, click, rub or gallop Extremities: extremities normal, atraumatic, no cyanosis or edema  Disposition:   Discharge Orders    Future Appointments: Provider: Department: Dept Phone: Center:   05/21/2012 2:00 PM [facility] [phone] [facility]   05/23/2012 8:00 AM [facility] [phone] [facility]   06/15/2012 10:00 AM Hilarie Fredrickson, MD [facility] [phone] [facility]       Medication List     As of 05/17/2012  1:07 PM    STOP taking these medications         naproxen sodium 220 MG tablet   Commonly known as: ANAPROX      TAKE these medications         ARIPiprazole 5 MG tablet   Commonly known as: ABILIFY   Take 2.5 mg by mouth daily.      ferrous fumarate 325 (106 FE) MG Tabs   Commonly known as: HEMOCYTE - 106 mg FE   Take 1 tablet (106 mg of iron total) by mouth 2 (two) times daily.      FLUOXETINE HCL PO   Take 60 mg by mouth daily.      lamoTRIgine 100 MG tablet   Commonly known as: LAMICTAL   Take 100 mg  by mouth daily.      multivitamin with minerals Tabs   Take 1 tablet by mouth daily.      pantoprazole 40 MG tablet   Commonly known as: PROTONIX   Take 1 tablet (40 mg total) by mouth daily at 12 noon.      rOPINIRole 0.25 MG tablet   Commonly known as: REQUIP   Take 0.25 mg by mouth 3 (three) times daily.            Follow-up Information    Follow up with Yancey Flemings, MD. On 05/21/2012. (At 2 PM.  this is nurse visit for  instructions and pre procedure visit. )    Contact information:   [address] [place] [address] [phone]       Follow up with Yancey Flemings, MD. On 05/23/2012. (arrive 830 AM)    Contact information:   [address] [place] [address] [phone]       Follow up with Yancey Flemings, MD. On 06/15/2012. (10 AM for actual MD visit. )    Contact information:   [address] [place] [address] [phone]          Signed: Katy Apo 05/17/2012, 1:01 PM

## 2012-05-17 NOTE — Progress Notes (Signed)
Subjective: No bleeding, feels better, endoscopy report noted. Plans for capsule endoscopy outpatient. Followup hemoglobin pending disposition pending results  Objective: Vital signs in last 24 hours: Temp:  [98.3 F (36.8 C)-99.1 F (37.3 C)] 98.3 F (36.8 C) (09/12 0431) Pulse Rate:  [81-91] 82  (09/12 0431) Resp:  [12-31] 16  (09/12 0431) BP: (113-167)/(52-92) 162/76 mmHg (09/12 0431) SpO2:  [89 %-99 %] 95 % (09/12 0431) Weight change:  Last BM Date: 05/14/12  Intake/Output from previous day: 09/11 0701 - 09/12 0700 In: 890 [P.O.:240; I.V.:650] Out: -  Intake/Output this shift:    Resp: clear to auscultation bilaterally Cardio: regular rate and rhythm, S1, S2 normal, no murmur, click, rub or gallop GI: soft, non-tender; bowel sounds normal; no masses,  no organomegaly  Lab Results:  Results for orders placed during the hospital encounter of 05/14/12 (from the past 24 hour(s))  GLUCOSE, CAPILLARY     Status: Normal   Collection Time   05/17/12  7:16 AM      Component Value Range   Glucose-Capillary 98  70 - 99 mg/dL      Studies/Results: No results found.  Medications:  Prior to Admission:  Prescriptions prior to admission  Medication Sig Dispense Refill  . ARIPiprazole (ABILIFY) 5 MG tablet Take 2.5 mg by mouth daily.      Marland Kitchen FLUOXETINE HCL PO Take 60 mg by mouth daily.      Marland Kitchen lamoTRIgine (LAMICTAL) 100 MG tablet Take 100 mg by mouth daily.      . Multiple Vitamin (MULTIVITAMIN WITH MINERALS) TABS Take 1 tablet by mouth daily.      . naproxen sodium (ANAPROX) 220 MG tablet Take 220 mg by mouth 2 (two) times daily as needed. For pain      . rOPINIRole (REQUIP) 0.25 MG tablet Take 0.25 mg by mouth 3 (three) times daily.       Scheduled:   . ARIPiprazole  2.5 mg Oral Daily  . FLUoxetine  40 mg Oral Daily  . influenza  inactive virus vaccine  0.5 mL Intramuscular Tomorrow-1000  . lamoTRIgine  100 mg Oral Daily  . multivitamin with minerals  1 tablet Oral Daily    . pantoprazole  40 mg Oral Q1200  . sodium chloride  3 mL Intravenous Q12H   Continuous:   . DISCONTD: sodium chloride    . DISCONTD: sodium chloride      Assessment/Plan: Symptomatic iron deficient anemia. EGD without etiology. Patient for outpatient capsule endoscopy. Patient will have a trauma oral I, if fails IV iron will be indicated. Disposition pending followup hemoglobin   LOS: 3 days   Callaway Hardigree D 05/17/2012, 8:39 AM

## 2012-05-17 NOTE — Progress Notes (Signed)
No follow up labs for today.  Just drawing blood now Feels a bit weak but overall better. Discussed EGD findings and follow up plans.   Arranging outpt capsule endoscopy, and MD ROV with Dr Marina Goodell. See d/c follow up dates I would go ahead and give parenteral Iron while she is here before discharge.   I started BID po Iron Will need CBC drawn at primary MD office within next 2 weeks.   Signing off.  Jennye Moccasin PA-C

## 2012-05-17 NOTE — Telephone Encounter (Signed)
Pt scheduled for capsule endo teaching 05/21/12@2pm , capsule endo scheduled for 05/23/12@8am . Pt has unexplained anemia. Scheduled with Blase Mess PA, she will notify pt of appt date and time.

## 2012-05-18 NOTE — Progress Notes (Signed)
I have personally taken an interval history, reviewed the chart, and examined the patient.  I agree with the extender's note, impression and recommendations.  

## 2012-05-28 ENCOUNTER — Telehealth: Payer: Self-pay

## 2012-05-28 NOTE — Telephone Encounter (Signed)
Pts capsule endo was denied by [employer]. Spoke with pt and let her know that we need to reschedule the procedure. Pt knows not to prep. Will call pt back when we hear back from the insurance company.

## 2012-06-04 ENCOUNTER — Telehealth: Payer: Self-pay | Admitting: Internal Medicine

## 2012-06-04 NOTE — Telephone Encounter (Signed)
Forms faxed to patient for her to fill out the appeal authorizations papers.

## 2012-06-04 NOTE — Telephone Encounter (Signed)
Pts insurance company denied the capsule endoscopy for a 2nd time. The reason on the denial states that in this case there is no documentation of a colonoscopy and the last one was done 09/2010. Called [employer] and they state that an appeal would have to be sent in to see if it can be approved. The pt must sign 2 forms that have been faxed giving [place] permission to appeal the decision. Dr. Marina Goodell please advise if you want to pursue this.

## 2012-06-04 NOTE — Telephone Encounter (Signed)
Yes, I'm not sure why they are being so obtuse. At this point get Amy involved to assist with an appeal. We should be able to get approval for this study.

## 2012-06-05 ENCOUNTER — Encounter: Payer: Self-pay | Admitting: Physician Assistant

## 2012-06-05 NOTE — Telephone Encounter (Signed)
Letter dictated for pt to appeal the capsule endo. Letter along with forms given to Derenda Fennel to sent for appeal.

## 2012-06-11 ENCOUNTER — Telehealth: Payer: Self-pay | Admitting: Internal Medicine

## 2012-06-11 NOTE — Telephone Encounter (Signed)
Left message for pt that we are waiting to hear back on the appeal from [facility].

## 2012-06-11 NOTE — Telephone Encounter (Signed)
Called [employer] and the claim is still in the appeals process. Per [employer] they have until the 31st of this month to make a decision based on their protocol. Spoke with pt and she is aware.

## 2012-06-15 ENCOUNTER — Ambulatory Visit (INDEPENDENT_AMBULATORY_CARE_PROVIDER_SITE_OTHER): Payer: BC Managed Care – PPO | Admitting: Internal Medicine

## 2012-06-15 ENCOUNTER — Ambulatory Visit: Payer: BC Managed Care – PPO | Admitting: Internal Medicine

## 2012-06-15 DIAGNOSIS — D500 Iron deficiency anemia secondary to blood loss (chronic): Secondary | ICD-10-CM

## 2012-06-15 NOTE — Progress Notes (Signed)
Patient here for capsule endoscopy for Dr. Marina Goodell. Patient verbalized understanding of all verbal and written instructions. Patient has been NPO and did the prep required for the procedure. Patient swallowed the capsule without difficulty. Lot [id number] Expires- 10/21014.

## 2012-06-27 ENCOUNTER — Encounter: Payer: Self-pay | Admitting: Internal Medicine

## 2012-06-27 ENCOUNTER — Ambulatory Visit (INDEPENDENT_AMBULATORY_CARE_PROVIDER_SITE_OTHER): Payer: BC Managed Care – PPO | Admitting: Internal Medicine

## 2012-06-27 VITALS — BP 110/80 | HR 76 | Ht 61.5 in | Wt 214.4 lb

## 2012-06-27 DIAGNOSIS — D509 Iron deficiency anemia, unspecified: Secondary | ICD-10-CM

## 2012-06-27 DIAGNOSIS — K222 Esophageal obstruction: Secondary | ICD-10-CM

## 2012-06-27 DIAGNOSIS — K219 Gastro-esophageal reflux disease without esophagitis: Secondary | ICD-10-CM

## 2012-06-27 DIAGNOSIS — Z86010 Personal history of colonic polyps: Secondary | ICD-10-CM

## 2012-06-27 DIAGNOSIS — K449 Diaphragmatic hernia without obstruction or gangrene: Secondary | ICD-10-CM

## 2012-06-27 NOTE — Patient Instructions (Addendum)
Please follow up with Dr. Perry as needed 

## 2012-06-27 NOTE — Progress Notes (Signed)
HISTORY OF PRESENT ILLNESS:  Megan Whitney is a 56 y.o. female who presents today regarding recurrent severe iron deficiency anemia for which she was hospitalized September 2013. Her hemoglobin at that time was approximately 4. She required transfusion. She did have transient dark stools for short period of time, but otherwise denies GI bleeding. Remote problems with epistaxis, but nothing recently. I evaluated the patient in January of 2012 for iron deficiency anemia. She does have a history of adenomatous colon polyps with prior colonoscopies in 2002 2009 and most recently in 2012 to evaluate iron deficiency anemia. The most recent examination revealed diverticulosis and a diminutive polyp. The terminal ileum was normal. Followup in 5 years recommended. Upper endoscopy at that same time revealed esophagitis, duodenitis, and significant Cameron erosions. Cameron erosions were felt to be the cause for iron deficiency anemia and chronic iron replacement recommended. She took iron for proximally one month then discontinued due to gastric upset. During her most recent hospitalization she underwent upper endoscopy with push enteroscopy. Large hiatal hernia noted. No comment on erosions. She did undergo stricture dilation for symptomatic dysphagia. She stays on chronic PPI therapy for GERD. Off PPI significant symptoms. No recurrent dysphagia since her dilation. We did perform capsule endoscopy earlier this week. The examination was excellent study with and a complete study. No significant small bowel pathology noted. She is now on Hemocyte 106 mg of elemental iron twice daily. She seems to be tolerating this. Overall she is feeling much better. She tells me that her most recent hemoglobin was greater than 9. GI review of systems is negative  REVIEW OF SYSTEMS:  All non-GI ROS negative entirely  Past Medical History  Diagnosis Date  . Anemia     chroniv blood loss anemia felt due to camerons lesions in  esophagus.   . Bipolar 1 disorder   . Depression   . Lung cancer 2002  . Shortness of breath   . Anginal pain   . Sleep apnea   . GERD (gastroesophageal reflux disease)   . Headache   . Arthritis   . Colon polyps 2002, 2009    hyperplastic 2002, no path from 2012.   Marland Kitchen Hyperlipidemia   . Epistaxis     around 2011 or 2012, required cauterization.   . Esophageal stricture     "gastroesophageal stricture"    Past Surgical History  Procedure Date  . Wedge resection 2002    lung cancer  . Joint replacement   . Total hip arthroplasty     bilateral  . Tubal ligation   . Enteroscopy 05/16/2012    Procedure: ENTEROSCOPY;  Surgeon: Louis Meckel, MD;  Location: [facility] ENDOSCOPY;  Service: Endoscopy;  Laterality: N/A;  . Balloon dilation 05/16/2012    Procedure: BALLOON DILATION;  Surgeon: Louis Meckel, MD;  Location: [facility] ENDOSCOPY;  Service: Endoscopy;  Laterality: N/A;    Social History [name]  reports that she has never smoked. She has never used smokeless tobacco. She reports that she drinks about .6 ounces of alcohol per week. She reports that she does not use illicit drugs.  family history is not on file.  Allergies  Allergen Reactions  . Azithromycin Anaphylaxis  . Penicillins Anaphylaxis       PHYSICAL EXAMINATION: Vital signs: BP 110/80  Pulse 76  Ht 5' 1.5" (1.562 m)  Wt 214 lb 6.4 oz (97.251 kg)  BMI 39.85 kg/m2 General: Well-developed, well-nourished, no acute distress HEENT: Sclerae are anicteric, conjunctiva pink.  Oral mucosa intact Lungs: Clear Heart: Regular Abdomen: soft, nontender, nondistended, no obvious ascites, no peritoneal signs, normal bowel sounds. No organomegaly. Extremities: No edema Psychiatric: alert and oriented x3. Cooperative    ASSESSMENT:  #1. Recurrent iron deficiency anemia secondary to [facility] erosions (gastric mucosal erosions secondary to diaphragmatic mechanical trauma along the hiatal hernia). Common cause for iron  deficiency anemia. #2. GERD complicated by esophagitis and peptic stricture. Asymptomatic post dilation on PPI #3. History of adenomatous colon polyps. Last colonoscopy January 2012  PLAN:  #1. Chronic iron therapy. The patient comes off of iron therapy then her anemia will recur #2. If the patient cannot tolerate oral iron therapy, then IV iron therapy as recommended by Dr. Nehemiah Settle #3. Dr. Nehemiah Settle should monitor the patient's CBC until hemoglobin normal, then periodically at his discretion. Any time for symptoms. #4. If the patient were to develop recurrent iron deficiency anemia despite compliance with reasonable iron therapy, then surgery for repair of hiatal hernia could be considered. I discussed this with the patient. #5. Continue PPI and reflux precautions #6. Esophageal dilation as needed #7. Surveillance colonoscopy around January 2012. Interval followup as needed

## 2012-07-17 ENCOUNTER — Telehealth: Payer: Self-pay | Admitting: Internal Medicine

## 2012-07-17 NOTE — Telephone Encounter (Signed)
Discussed with pt that per her last OV Note from Dr. Marina Goodell she should have her PCP follow her hemoglobin until normal. Pt states she will call her PCP office.

## 2012-07-17 NOTE — Telephone Encounter (Signed)
Left message for pt to call back  °

## 2012-07-31 ENCOUNTER — Other Ambulatory Visit (HOSPITAL_COMMUNITY): Payer: Self-pay | Admitting: Internal Medicine

## 2012-07-31 DIAGNOSIS — Z1231 Encounter for screening mammogram for malignant neoplasm of breast: Secondary | ICD-10-CM

## 2012-08-22 ENCOUNTER — Ambulatory Visit (HOSPITAL_COMMUNITY)
Admission: RE | Admit: 2012-08-22 | Discharge: 2012-08-22 | Disposition: A | Discharge status: Home / Self Care | Payer: BC Managed Care – PPO | Source: Ambulatory Visit | Attending: Internal Medicine | Admitting: Internal Medicine

## 2012-08-22 DIAGNOSIS — Z1231 Encounter for screening mammogram for malignant neoplasm of breast: Secondary | ICD-10-CM

## 2012-08-31 ENCOUNTER — Other Ambulatory Visit: Payer: Self-pay | Admitting: Obstetrics and Gynecology

## 2012-08-31 ENCOUNTER — Other Ambulatory Visit (HOSPITAL_COMMUNITY)
Admission: RE | Admit: 2012-08-31 | Discharge: 2012-08-31 | Disposition: A | Discharge status: Home / Self Care | Payer: BC Managed Care – PPO | Source: Ambulatory Visit | Attending: Obstetrics and Gynecology | Admitting: Obstetrics and Gynecology

## 2012-08-31 DIAGNOSIS — Z01419 Encounter for gynecological examination (general) (routine) without abnormal findings: Secondary | ICD-10-CM | POA: Insufficient documentation

## 2012-08-31 DIAGNOSIS — Z1151 Encounter for screening for human papillomavirus (HPV): Secondary | ICD-10-CM | POA: Insufficient documentation

## 2013-07-30 ENCOUNTER — Other Ambulatory Visit (HOSPITAL_COMMUNITY): Payer: Self-pay | Admitting: Internal Medicine

## 2013-07-30 DIAGNOSIS — Z1231 Encounter for screening mammogram for malignant neoplasm of breast: Secondary | ICD-10-CM

## 2013-08-26 ENCOUNTER — Ambulatory Visit (HOSPITAL_COMMUNITY)
Admission: RE | Admit: 2013-08-26 | Discharge: 2013-08-26 | Disposition: A | Discharge status: Home / Self Care | Payer: BC Managed Care – PPO | Source: Ambulatory Visit | Attending: Internal Medicine | Admitting: Internal Medicine

## 2013-08-26 DIAGNOSIS — Z1231 Encounter for screening mammogram for malignant neoplasm of breast: Secondary | ICD-10-CM

## 2014-06-04 ENCOUNTER — Encounter: Payer: Self-pay | Admitting: General Surgery

## 2014-08-01 ENCOUNTER — Other Ambulatory Visit (HOSPITAL_COMMUNITY): Payer: Self-pay | Admitting: Internal Medicine

## 2014-08-01 DIAGNOSIS — Z1231 Encounter for screening mammogram for malignant neoplasm of breast: Secondary | ICD-10-CM

## 2014-08-27 ENCOUNTER — Ambulatory Visit (HOSPITAL_COMMUNITY)
Admission: RE | Admit: 2014-08-27 | Discharge: 2014-08-27 | Disposition: A | Discharge status: Home / Self Care | Payer: BC Managed Care – PPO | Source: Ambulatory Visit | Attending: Internal Medicine | Admitting: Internal Medicine

## 2014-08-27 DIAGNOSIS — Z1231 Encounter for screening mammogram for malignant neoplasm of breast: Secondary | ICD-10-CM

## 2015-02-05 ENCOUNTER — Encounter: Payer: Self-pay | Admitting: Internal Medicine

## 2015-10-30 ENCOUNTER — Other Ambulatory Visit: Payer: Self-pay

## 2015-10-30 DIAGNOSIS — Z1231 Encounter for screening mammogram for malignant neoplasm of breast: Secondary | ICD-10-CM

## 2015-11-10 ENCOUNTER — Encounter: Payer: Self-pay | Admitting: Internal Medicine

## 2015-11-13 ENCOUNTER — Ambulatory Visit: Payer: Self-pay

## 2015-12-01 ENCOUNTER — Encounter: Payer: Self-pay | Admitting: Internal Medicine

## 2015-12-08 DIAGNOSIS — F321 Major depressive disorder, single episode, moderate: Secondary | ICD-10-CM | POA: Diagnosis not present

## 2015-12-22 ENCOUNTER — Ambulatory Visit
Admission: RE | Admit: 2015-12-22 | Discharge: 2015-12-22 | Disposition: A | Discharge status: Home / Self Care | Payer: BLUE CROSS/BLUE SHIELD | Source: Ambulatory Visit

## 2015-12-22 DIAGNOSIS — Z1231 Encounter for screening mammogram for malignant neoplasm of breast: Secondary | ICD-10-CM

## 2016-01-22 ENCOUNTER — Encounter: Payer: Self-pay | Admitting: Internal Medicine

## 2016-01-29 DIAGNOSIS — K922 Gastrointestinal hemorrhage, unspecified: Secondary | ICD-10-CM | POA: Diagnosis not present

## 2016-01-29 DIAGNOSIS — M549 Dorsalgia, unspecified: Secondary | ICD-10-CM | POA: Diagnosis not present

## 2016-01-29 DIAGNOSIS — D649 Anemia, unspecified: Secondary | ICD-10-CM | POA: Diagnosis not present

## 2016-01-30 DIAGNOSIS — M549 Dorsalgia, unspecified: Secondary | ICD-10-CM | POA: Diagnosis not present

## 2016-01-30 DIAGNOSIS — D649 Anemia, unspecified: Secondary | ICD-10-CM | POA: Diagnosis not present

## 2016-01-30 DIAGNOSIS — K922 Gastrointestinal hemorrhage, unspecified: Secondary | ICD-10-CM | POA: Diagnosis not present

## 2016-01-31 DIAGNOSIS — K922 Gastrointestinal hemorrhage, unspecified: Secondary | ICD-10-CM | POA: Diagnosis not present

## 2016-01-31 DIAGNOSIS — M549 Dorsalgia, unspecified: Secondary | ICD-10-CM | POA: Diagnosis not present

## 2016-01-31 DIAGNOSIS — D649 Anemia, unspecified: Secondary | ICD-10-CM | POA: Diagnosis not present

## 2016-02-01 DIAGNOSIS — D649 Anemia, unspecified: Secondary | ICD-10-CM | POA: Diagnosis not present

## 2016-02-01 DIAGNOSIS — M549 Dorsalgia, unspecified: Secondary | ICD-10-CM | POA: Diagnosis not present

## 2016-02-01 DIAGNOSIS — K922 Gastrointestinal hemorrhage, unspecified: Secondary | ICD-10-CM | POA: Diagnosis not present

## 2016-02-02 DIAGNOSIS — D649 Anemia, unspecified: Secondary | ICD-10-CM | POA: Diagnosis not present

## 2016-02-02 DIAGNOSIS — K922 Gastrointestinal hemorrhage, unspecified: Secondary | ICD-10-CM | POA: Diagnosis not present

## 2016-02-02 DIAGNOSIS — M549 Dorsalgia, unspecified: Secondary | ICD-10-CM | POA: Diagnosis not present

## 2016-02-03 ENCOUNTER — Encounter: Payer: Self-pay | Admitting: Internal Medicine

## 2016-02-03 DIAGNOSIS — M549 Dorsalgia, unspecified: Secondary | ICD-10-CM | POA: Diagnosis not present

## 2016-02-03 DIAGNOSIS — K922 Gastrointestinal hemorrhage, unspecified: Secondary | ICD-10-CM | POA: Diagnosis not present

## 2016-02-03 DIAGNOSIS — D649 Anemia, unspecified: Secondary | ICD-10-CM | POA: Diagnosis not present

## 2016-02-18 DIAGNOSIS — F319 Bipolar disorder, unspecified: Secondary | ICD-10-CM | POA: Diagnosis not present

## 2016-02-18 DIAGNOSIS — E7800 Pure hypercholesterolemia, unspecified: Secondary | ICD-10-CM | POA: Diagnosis not present

## 2016-02-18 DIAGNOSIS — D649 Anemia, unspecified: Secondary | ICD-10-CM | POA: Diagnosis not present

## 2016-04-18 DIAGNOSIS — F321 Major depressive disorder, single episode, moderate: Secondary | ICD-10-CM | POA: Diagnosis not present

## 2016-05-05 DIAGNOSIS — F321 Major depressive disorder, single episode, moderate: Secondary | ICD-10-CM | POA: Diagnosis not present

## 2016-06-27 DIAGNOSIS — F321 Major depressive disorder, single episode, moderate: Secondary | ICD-10-CM | POA: Diagnosis not present

## 2016-07-25 DIAGNOSIS — F321 Major depressive disorder, single episode, moderate: Secondary | ICD-10-CM | POA: Diagnosis not present

## 2016-09-06 DIAGNOSIS — Z0000 Encounter for general adult medical examination without abnormal findings: Secondary | ICD-10-CM | POA: Diagnosis not present

## 2016-09-06 DIAGNOSIS — E7800 Pure hypercholesterolemia, unspecified: Secondary | ICD-10-CM | POA: Diagnosis not present

## 2016-09-16 DIAGNOSIS — J069 Acute upper respiratory infection, unspecified: Secondary | ICD-10-CM | POA: Diagnosis not present

## 2016-09-19 DIAGNOSIS — F321 Major depressive disorder, single episode, moderate: Secondary | ICD-10-CM | POA: Diagnosis not present

## 2016-09-27 DIAGNOSIS — F321 Major depressive disorder, single episode, moderate: Secondary | ICD-10-CM | POA: Diagnosis not present

## 2016-10-06 ENCOUNTER — Other Ambulatory Visit (HOSPITAL_COMMUNITY)
Admission: RE | Admit: 2016-10-06 | Discharge: 2016-10-06 | Disposition: A | Discharge status: Home / Self Care | Payer: BLUE CROSS/BLUE SHIELD | Source: Ambulatory Visit | Attending: Obstetrics and Gynecology | Admitting: Obstetrics and Gynecology

## 2016-10-06 ENCOUNTER — Other Ambulatory Visit: Payer: Self-pay | Admitting: Obstetrics and Gynecology

## 2016-10-06 DIAGNOSIS — Z01419 Encounter for gynecological examination (general) (routine) without abnormal findings: Secondary | ICD-10-CM | POA: Diagnosis not present

## 2016-10-06 DIAGNOSIS — Z1151 Encounter for screening for human papillomavirus (HPV): Secondary | ICD-10-CM | POA: Diagnosis not present

## 2016-10-07 DIAGNOSIS — J019 Acute sinusitis, unspecified: Secondary | ICD-10-CM | POA: Diagnosis not present

## 2016-10-10 LAB — CYTOLOGY - PAP
Diagnosis: NEGATIVE
HPV (WINDOPATH): NOT DETECTED

## 2016-10-17 DIAGNOSIS — F321 Major depressive disorder, single episode, moderate: Secondary | ICD-10-CM | POA: Diagnosis not present

## 2016-10-28 DIAGNOSIS — J069 Acute upper respiratory infection, unspecified: Secondary | ICD-10-CM | POA: Diagnosis not present

## 2016-11-17 ENCOUNTER — Other Ambulatory Visit: Payer: Self-pay | Admitting: Internal Medicine

## 2016-11-17 DIAGNOSIS — Z1231 Encounter for screening mammogram for malignant neoplasm of breast: Secondary | ICD-10-CM

## 2016-12-12 DIAGNOSIS — F321 Major depressive disorder, single episode, moderate: Secondary | ICD-10-CM | POA: Diagnosis not present

## 2016-12-14 DIAGNOSIS — R002 Palpitations: Secondary | ICD-10-CM | POA: Diagnosis not present

## 2016-12-23 ENCOUNTER — Ambulatory Visit: Payer: BLUE CROSS/BLUE SHIELD

## 2017-01-03 DIAGNOSIS — F321 Major depressive disorder, single episode, moderate: Secondary | ICD-10-CM | POA: Diagnosis not present

## 2017-01-05 DIAGNOSIS — E785 Hyperlipidemia, unspecified: Secondary | ICD-10-CM | POA: Diagnosis not present

## 2017-01-05 DIAGNOSIS — K219 Gastro-esophageal reflux disease without esophagitis: Secondary | ICD-10-CM | POA: Diagnosis not present

## 2017-01-05 DIAGNOSIS — Z6824 Body mass index (BMI) 24.0-24.9, adult: Secondary | ICD-10-CM | POA: Diagnosis not present

## 2017-01-05 DIAGNOSIS — F319 Bipolar disorder, unspecified: Secondary | ICD-10-CM | POA: Diagnosis not present

## 2017-01-10 ENCOUNTER — Ambulatory Visit
Admission: RE | Admit: 2017-01-10 | Discharge: 2017-01-10 | Disposition: A | Discharge status: Home / Self Care | Payer: BLUE CROSS/BLUE SHIELD | Source: Ambulatory Visit | Attending: Internal Medicine | Admitting: Internal Medicine

## 2017-01-10 ENCOUNTER — Other Ambulatory Visit: Payer: Self-pay | Admitting: Internal Medicine

## 2017-01-10 DIAGNOSIS — Z1231 Encounter for screening mammogram for malignant neoplasm of breast: Secondary | ICD-10-CM | POA: Diagnosis not present

## 2017-02-22 DIAGNOSIS — F321 Major depressive disorder, single episode, moderate: Secondary | ICD-10-CM | POA: Diagnosis not present

## 2017-03-22 DIAGNOSIS — F321 Major depressive disorder, single episode, moderate: Secondary | ICD-10-CM | POA: Diagnosis not present

## 2017-04-11 DIAGNOSIS — Z1159 Encounter for screening for other viral diseases: Secondary | ICD-10-CM | POA: Diagnosis not present

## 2017-04-11 DIAGNOSIS — E785 Hyperlipidemia, unspecified: Secondary | ICD-10-CM | POA: Diagnosis not present

## 2017-04-13 DIAGNOSIS — E785 Hyperlipidemia, unspecified: Secondary | ICD-10-CM | POA: Diagnosis not present

## 2017-04-13 DIAGNOSIS — K219 Gastro-esophageal reflux disease without esophagitis: Secondary | ICD-10-CM | POA: Diagnosis not present

## 2017-04-13 DIAGNOSIS — F319 Bipolar disorder, unspecified: Secondary | ICD-10-CM | POA: Diagnosis not present

## 2017-04-13 DIAGNOSIS — R410 Disorientation, unspecified: Secondary | ICD-10-CM | POA: Diagnosis not present

## 2017-05-05 DIAGNOSIS — F321 Major depressive disorder, single episode, moderate: Secondary | ICD-10-CM | POA: Diagnosis not present

## 2017-05-05 DIAGNOSIS — F3130 Bipolar disorder, current episode depressed, mild or moderate severity, unspecified: Secondary | ICD-10-CM | POA: Diagnosis not present

## 2017-05-16 ENCOUNTER — Encounter: Payer: Self-pay | Admitting: Cardiology

## 2017-05-16 NOTE — Progress Notes (Signed)
Cardiology Office Note  Date: 05/17/2017   ID: Brenetta, Penny [birth year], MRN [id number]  PCP: Celene Squibb, MD  Consulting Cardiologist: Rozann Lesches, MD   Chief Complaint  Patient presents with  . Palpitations    History of Present Illness: Megan Whitney is a 61 y.o. female referred for cardiology consultation by Dr. Nevada Crane for evaluation of palpitations. She presents describing 3 episodes of prolonged rapid palpitations that have occurred over the last year since January. She states of a feeling of very fast heart rate, jitteriness, lightheadedness and shortness of breath. The most recent event prompted a visit with Dr. Nevada Crane. She states that her symptoms lasted for 30 minutes and she was very washed out after this occurred. She has not had frank syncope. At baseline she reports no exertional chest pain and has NYHA class II dyspnea. Interestingly, she describes a very long-standing history of more brief palpitations. She states that she has learned that if she coughs or bares down sometimes the symptoms improve. She also tells me that her daughter has atrial fibrillation diagnosed in her 80s.   I personally reviewed her ECG today which shows normal sinus rhythm with poor R-wave progression.  We reviewed her medications.  Past Medical History:  Diagnosis Date  . Anemia    History of GI blood loss  . Arthritis   . Bipolar 1 disorder ([place])   . Colon polyps   . Depression   . Epistaxis    Around 2011 or 2012, required cauterization.   . Esophageal stricture   . GERD (gastroesophageal reflux disease)   . Headache(784.0)   . Hyperlipidemia   . Interstitial cystitis   . Lung cancer ([place]) 2002  . Sleep apnea     Past Surgical History:  Procedure Laterality Date  . BALLOON DILATION  05/16/2012   Procedure: BALLOON DILATION;  Surgeon: Inda Castle, MD;  Location: [facility];  Service: Endoscopy;  Laterality: N/A;  . ENTEROSCOPY  05/16/2012   Procedure:  ENTEROSCOPY;  Surgeon: Inda Castle, MD;  Location: [facility];  Service: Endoscopy;  Laterality: N/A;  . JOINT REPLACEMENT    . TOTAL HIP ARTHROPLASTY     bilateral  . TUBAL LIGATION    . WEDGE RESECTION  2002   lung cancer    Current Outpatient Prescriptions  Medication Sig Dispense Refill  . atorvastatin (LIPITOR) 10 MG tablet Take 10 mg by mouth daily.    Marland Kitchen Fe Cbn-Fe Gluc-FA-B12-C-DSS (FERRALET 90) 90-1 MG TABS Take 1 tablet by mouth daily.    Marland Kitchen FLUOXETINE HCL PO Take 40 mg by mouth daily.     Marland Kitchen lamoTRIgine (LAMICTAL) 100 MG tablet Take 200 mg by mouth daily.     Marland Kitchen lurasidone (LATUDA) 40 MG TABS tablet Take 60 mg by mouth daily with breakfast.     . Multiple Vitamin (MULTIVITAMIN WITH MINERALS) TABS Take 1 tablet by mouth daily.    . OXcarbazepine (TRILEPTAL) 150 MG tablet Take 150 mg by mouth daily.    . pantoprazole (PROTONIX) 40 MG tablet Take 40 mg by mouth daily.     No current facility-administered medications for this visit.    Allergies:  Azithromycin; Penicillins; and Adhesive [tape]   Social History: The patient  reports that she has never smoked. She has never used smokeless tobacco. She reports that she drinks about 0.6 oz of alcohol per week . She reports that she does not use drugs.   Family History: The patient's family  history includes Arthritis in her mother; Diabetes Mellitus II in her father; Heart disease in her father; Hypertension in her father.   ROS:  Please see the history of present illness. Otherwise, complete review of systems is positive for anxiety.  All other systems are reviewed and negative.   Physical Exam: VS:  BP 114/74 (BP Location: Right Arm)   Pulse 85   Ht 5' (1.524 m)   Wt 145 lb (65.8 kg)   SpO2 94%   BMI 28.32 kg/m , BMI Body mass index is 28.32 kg/m.  Wt Readings from Last 3 Encounters:  05/17/17 145 lb (65.8 kg)  06/27/12 214 lb 6.4 oz (97.3 kg)  05/14/12 227 lb 8.2 oz (103.2 kg)    General:  Normally nourished  appearing woman, appears comfortable at rest. HEENT: Conjunctiva and lids normal, oropharynx clear. Neck: Supple, no elevated JVP or carotid bruits, no thyromegaly. Lungs: Clear to auscultation, nonlabored breathing at rest. Cardiac: Regular rate and rhythm, no S3 or significant systolic murmur, no pericardial rub. Abdomen: Soft, nontender, bowel sounds present, no guarding or rebound. Extremities: No pitting edema, distal pulses 2+. Skin: Warm and dry. Musculoskeletal: No kyphosis. Neuropsychiatric: Alert and oriented x3, affect grossly appropriate.  ECG: I personally reviewed the tracing from September 2013 which showed normal sinus rhythm with borderline prolonged QT interval.  Recent Labwork:  August 2018: Hemoglobin 13.1, platelets 251, BUN 21, creatinine 0.79, potassium 4.5, AST 24, ALT 16, cholesterol 198, triglycerides 46, HDL 88, LDL 101  Other Studies Reviewed Today:  Echocardiogram 05/15/2012: Study Conclusions  - Left ventricle: The cavity size was normal. Systolic function was normal. The estimated ejection fraction was in the range of 55% to 60%. Wall motion was normal; there were no regional wall motion abnormalities. Left ventricular diastolic function parameters were normal. - Mitral valve: Mild regurgitation. - Left atrium: The atrium was moderately dilated.  Assessment and Plan:  1. Intermittent palpitations as described above, reports three more prominent events in the last year associated with lightheadedness and shortness of breath, most recent event was 30 minutes in duration. She has not had frank syncope. Describes a much longer standing history of brief palpitations that sometimes improve with cough or bearing down. Could be undiagnosed PSVT although her daughter also has a history of atrial fibrillation. We will try to capture what she is feeling by cardiac monitor and also obtain an echocardiogram to assess cardiac structure and function.  2.  Hyperlipidemia, on Lipitor. Recent LDL 101. HDL 88.  3. History of bipolar disorder on psychotropic medications, none specifically associated with arrhythmias.  4. History of sleep apnea.  Current medicines were reviewed with the patient today.   Orders Placed This Encounter  Procedures  . Cardiac event monitor  . EKG 12-Lead  . ECHOCARDIOGRAM COMPLETE    Disposition: Call with test results.  Signed, Satira Sark, MD, [facility] 05/17/2017 1:45 PM    [facility] [address] [place], [place] [address] Phone: [phone]; Fax: [phone]

## 2017-05-17 ENCOUNTER — Ambulatory Visit (INDEPENDENT_AMBULATORY_CARE_PROVIDER_SITE_OTHER): Payer: BLUE CROSS/BLUE SHIELD | Admitting: Cardiology

## 2017-05-17 ENCOUNTER — Encounter: Payer: Self-pay | Admitting: Cardiology

## 2017-05-17 VITALS — BP 114/74 | HR 85 | Ht 60.0 in | Wt 145.0 lb

## 2017-05-17 DIAGNOSIS — E782 Mixed hyperlipidemia: Secondary | ICD-10-CM

## 2017-05-17 DIAGNOSIS — F3130 Bipolar disorder, current episode depressed, mild or moderate severity, unspecified: Secondary | ICD-10-CM

## 2017-05-17 DIAGNOSIS — R55 Syncope and collapse: Secondary | ICD-10-CM | POA: Diagnosis not present

## 2017-05-17 DIAGNOSIS — F319 Bipolar disorder, unspecified: Secondary | ICD-10-CM

## 2017-05-17 DIAGNOSIS — G4733 Obstructive sleep apnea (adult) (pediatric): Secondary | ICD-10-CM | POA: Diagnosis not present

## 2017-05-17 DIAGNOSIS — R002 Palpitations: Secondary | ICD-10-CM

## 2017-05-17 NOTE — Patient Instructions (Signed)
Medication Instructions:  Your physician recommends that you continue on your current medications as directed. Please refer to the Current Medication list given to you today.   Labwork: none  Testing/Procedures: Your physician has requested that you have an echocardiogram. Echocardiography is a painless test that uses sound waves to create images of your heart. It provides your doctor with information about the size and shape of your heart and how well your heart's chambers and valves are working. This procedure takes approximately one hour. There are no restrictions for this procedure.  Your physician has recommended that you wear an event monitor. Event monitors are medical devices that record the heart's electrical activity. Doctors most often [place] these monitors to diagnose arrhythmias. Arrhythmias are problems with the speed or rhythm of the heartbeat. The monitor is a small, portable device. You can wear one while you do your normal daily activities. This is usually used to diagnose what is causing palpitations/syncope (passing out).    Follow-Up: Your physician recommends that you schedule a follow-up appointment in: to be determined, we will call you with results   Any Other Special Instructions Will Be Listed Below (If Applicable).     If you need a refill on your cardiac medications before your next appointment, please call your pharmacy.

## 2017-05-18 ENCOUNTER — Ambulatory Visit (HOSPITAL_COMMUNITY)
Admission: RE | Admit: 2017-05-18 | Discharge: 2017-05-18 | Disposition: A | Discharge status: Home / Self Care | Payer: BLUE CROSS/BLUE SHIELD | Source: Ambulatory Visit | Attending: Cardiology | Admitting: Cardiology

## 2017-05-18 DIAGNOSIS — R002 Palpitations: Secondary | ICD-10-CM | POA: Insufficient documentation

## 2017-05-18 DIAGNOSIS — I517 Cardiomegaly: Secondary | ICD-10-CM | POA: Insufficient documentation

## 2017-05-18 DIAGNOSIS — R55 Syncope and collapse: Secondary | ICD-10-CM | POA: Diagnosis not present

## 2017-05-18 DIAGNOSIS — I358 Other nonrheumatic aortic valve disorders: Secondary | ICD-10-CM | POA: Insufficient documentation

## 2017-05-18 DIAGNOSIS — K219 Gastro-esophageal reflux disease without esophagitis: Secondary | ICD-10-CM | POA: Diagnosis not present

## 2017-05-18 LAB — ECHOCARDIOGRAM COMPLETE
AOVTI: 33.8 cm
AV Area VTI index: 0.89 cm2/m2
AV Area mean vel: 1.68 cm2
AV Mean grad: 4 mmHg
AV Peak grad: 8 mmHg
AV area mean vel ind: 0.99 cm2/m2
AV peak Index: 1.02
AV pk vel: 139 cm/s
AVAREAVTI: 1.72 cm2
AVCELMEANRAT: 0.66
Ao pk vel: 0.68 m/s
CHL CUP AV VEL: 1.5
CHL CUP RV SYS PRESS: 21 mmHg
CHL CUP TV REG PEAK VELOCITY: 212 cm/s
DOP CAL AO MEAN VELOCITY: 89.8 cm/s
EERAT: 6.67
EWDT: 211 ms
FS: 32 % (ref 28–44)
IVS/LV PW RATIO, ED: 1.01
LA ID, A-P, ES: 26 mm
LA diam end sys: 26 mm
LA vol A4C: 69.1 ml
LA vol: 57.2 mL
LADIAMINDEX: 1.54 cm/m2
LAVOLIN: 33.8 mL/m2
LDCA: 2.54 cm2
LV E/e' medial: 6.67
LV PW d: 8.33 mm — AB (ref 0.6–1.1)
LV SIMPSON'S DISK: 65
LV TDI E'LATERAL: 12.7
LV dias vol index: 43 mL/m2
LV dias vol: 74 mL (ref 46–106)
LV e' LATERAL: 12.7 cm/s
LV sys vol index: 15 mL/m2
LVEEAVG: 6.67
LVOT SV: 51 mL
LVOT VTI: 20 cm
LVOT peak VTI: 0.59 cm
LVOT peak grad rest: 4 mmHg
LVOTD: 18 mm
LVOTPV: 94 cm/s
LVSYSVOL: 25 mL
MV Dec: 211
MV Peak grad: 3 mmHg
MV pk A vel: 60.7 m/s
MVPKEVEL: 84.7 m/s
RV LATERAL S' VELOCITY: 12.6 cm/s
RV TAPSE: 21.6 mm
Stroke v: 48 ml
TDI e' medial: 8.49
TRMAXVEL: 212 cm/s
Valve area index: 0.89
Valve area: 1.5 cm2

## 2017-05-18 NOTE — Progress Notes (Signed)
*  PRELIMINARY RESULTS* Echocardiogram 2D Echocardiogram has been performed.  Megan Whitney 05/18/2017, 11:57 AM

## 2017-05-22 ENCOUNTER — Other Ambulatory Visit (HOSPITAL_COMMUNITY): Payer: BLUE CROSS/BLUE SHIELD

## 2017-05-25 ENCOUNTER — Ambulatory Visit (INDEPENDENT_AMBULATORY_CARE_PROVIDER_SITE_OTHER): Payer: BLUE CROSS/BLUE SHIELD

## 2017-05-25 DIAGNOSIS — R55 Syncope and collapse: Secondary | ICD-10-CM | POA: Diagnosis not present

## 2017-05-25 DIAGNOSIS — R002 Palpitations: Secondary | ICD-10-CM

## 2017-06-13 DIAGNOSIS — F321 Major depressive disorder, single episode, moderate: Secondary | ICD-10-CM | POA: Diagnosis not present

## 2017-07-14 DIAGNOSIS — E785 Hyperlipidemia, unspecified: Secondary | ICD-10-CM | POA: Diagnosis not present

## 2017-07-17 DIAGNOSIS — F319 Bipolar disorder, unspecified: Secondary | ICD-10-CM | POA: Diagnosis not present

## 2017-08-08 DIAGNOSIS — F321 Major depressive disorder, single episode, moderate: Secondary | ICD-10-CM | POA: Diagnosis not present

## 2017-08-25 DIAGNOSIS — J019 Acute sinusitis, unspecified: Secondary | ICD-10-CM | POA: Diagnosis not present

## 2017-08-25 DIAGNOSIS — R05 Cough: Secondary | ICD-10-CM | POA: Diagnosis not present

## 2017-08-25 DIAGNOSIS — R070 Pain in throat: Secondary | ICD-10-CM | POA: Diagnosis not present

## 2017-09-21 DIAGNOSIS — J019 Acute sinusitis, unspecified: Secondary | ICD-10-CM | POA: Diagnosis not present

## 2017-10-03 DIAGNOSIS — F321 Major depressive disorder, single episode, moderate: Secondary | ICD-10-CM | POA: Diagnosis not present

## 2017-10-23 DIAGNOSIS — F321 Major depressive disorder, single episode, moderate: Secondary | ICD-10-CM | POA: Diagnosis not present

## 2017-11-07 DIAGNOSIS — F321 Major depressive disorder, single episode, moderate: Secondary | ICD-10-CM | POA: Diagnosis not present

## 2017-11-25 DIAGNOSIS — Z9181 History of falling: Secondary | ICD-10-CM | POA: Diagnosis not present

## 2017-11-25 DIAGNOSIS — H8110 Benign paroxysmal vertigo, unspecified ear: Secondary | ICD-10-CM | POA: Diagnosis not present

## 2017-11-30 ENCOUNTER — Other Ambulatory Visit (HOSPITAL_COMMUNITY): Payer: Self-pay | Admitting: Internal Medicine

## 2017-11-30 DIAGNOSIS — H538 Other visual disturbances: Secondary | ICD-10-CM

## 2017-11-30 DIAGNOSIS — E785 Hyperlipidemia, unspecified: Secondary | ICD-10-CM | POA: Diagnosis not present

## 2017-11-30 DIAGNOSIS — Z85118 Personal history of other malignant neoplasm of bronchus and lung: Secondary | ICD-10-CM | POA: Diagnosis not present

## 2017-11-30 DIAGNOSIS — S0990XA Unspecified injury of head, initial encounter: Secondary | ICD-10-CM

## 2017-11-30 DIAGNOSIS — R51 Headache: Secondary | ICD-10-CM | POA: Diagnosis not present

## 2017-11-30 DIAGNOSIS — F319 Bipolar disorder, unspecified: Secondary | ICD-10-CM | POA: Diagnosis not present

## 2017-11-30 DIAGNOSIS — K219 Gastro-esophageal reflux disease without esophagitis: Secondary | ICD-10-CM | POA: Diagnosis not present

## 2017-11-30 DIAGNOSIS — R2689 Other abnormalities of gait and mobility: Secondary | ICD-10-CM | POA: Diagnosis not present

## 2017-11-30 DIAGNOSIS — H81399 Other peripheral vertigo, unspecified ear: Secondary | ICD-10-CM | POA: Diagnosis not present

## 2017-12-01 ENCOUNTER — Ambulatory Visit (HOSPITAL_COMMUNITY)
Admission: RE | Admit: 2017-12-01 | Discharge: 2017-12-01 | Disposition: A | Discharge status: Home / Self Care | Payer: BLUE CROSS/BLUE SHIELD | Source: Ambulatory Visit | Attending: Internal Medicine | Admitting: Internal Medicine

## 2017-12-01 DIAGNOSIS — H538 Other visual disturbances: Secondary | ICD-10-CM | POA: Diagnosis not present

## 2017-12-01 DIAGNOSIS — X58XXXA Exposure to other specified factors, initial encounter: Secondary | ICD-10-CM | POA: Insufficient documentation

## 2017-12-01 DIAGNOSIS — S0990XA Unspecified injury of head, initial encounter: Secondary | ICD-10-CM | POA: Insufficient documentation

## 2017-12-01 DIAGNOSIS — R42 Dizziness and giddiness: Secondary | ICD-10-CM | POA: Diagnosis not present

## 2017-12-05 DIAGNOSIS — F321 Major depressive disorder, single episode, moderate: Secondary | ICD-10-CM | POA: Diagnosis not present

## 2017-12-27 DIAGNOSIS — F321 Major depressive disorder, single episode, moderate: Secondary | ICD-10-CM | POA: Diagnosis not present

## 2018-02-01 DIAGNOSIS — F321 Major depressive disorder, single episode, moderate: Secondary | ICD-10-CM | POA: Diagnosis not present

## 2018-02-23 DIAGNOSIS — F321 Major depressive disorder, single episode, moderate: Secondary | ICD-10-CM | POA: Diagnosis not present

## 2018-03-14 DIAGNOSIS — F321 Major depressive disorder, single episode, moderate: Secondary | ICD-10-CM | POA: Diagnosis not present

## 2018-03-19 DIAGNOSIS — F321 Major depressive disorder, single episode, moderate: Secondary | ICD-10-CM | POA: Diagnosis not present

## 2018-03-23 DIAGNOSIS — F319 Bipolar disorder, unspecified: Secondary | ICD-10-CM | POA: Diagnosis not present

## 2018-03-23 DIAGNOSIS — E785 Hyperlipidemia, unspecified: Secondary | ICD-10-CM | POA: Diagnosis not present

## 2018-03-23 DIAGNOSIS — K219 Gastro-esophageal reflux disease without esophagitis: Secondary | ICD-10-CM | POA: Diagnosis not present

## 2018-03-23 DIAGNOSIS — Z85118 Personal history of other malignant neoplasm of bronchus and lung: Secondary | ICD-10-CM | POA: Diagnosis not present

## 2018-03-29 DIAGNOSIS — F319 Bipolar disorder, unspecified: Secondary | ICD-10-CM | POA: Diagnosis not present

## 2018-03-29 DIAGNOSIS — Z85118 Personal history of other malignant neoplasm of bronchus and lung: Secondary | ICD-10-CM | POA: Diagnosis not present

## 2018-03-29 DIAGNOSIS — K219 Gastro-esophageal reflux disease without esophagitis: Secondary | ICD-10-CM | POA: Diagnosis not present

## 2018-03-29 DIAGNOSIS — E785 Hyperlipidemia, unspecified: Secondary | ICD-10-CM | POA: Diagnosis not present

## 2018-04-24 DIAGNOSIS — F321 Major depressive disorder, single episode, moderate: Secondary | ICD-10-CM | POA: Diagnosis not present

## 2018-05-03 DIAGNOSIS — F321 Major depressive disorder, single episode, moderate: Secondary | ICD-10-CM | POA: Diagnosis not present

## 2018-05-22 DIAGNOSIS — F321 Major depressive disorder, single episode, moderate: Secondary | ICD-10-CM | POA: Diagnosis not present

## 2018-05-29 DIAGNOSIS — F321 Major depressive disorder, single episode, moderate: Secondary | ICD-10-CM | POA: Diagnosis not present

## 2018-06-04 DIAGNOSIS — Z6831 Body mass index (BMI) 31.0-31.9, adult: Secondary | ICD-10-CM | POA: Diagnosis not present

## 2018-06-04 DIAGNOSIS — K219 Gastro-esophageal reflux disease without esophagitis: Secondary | ICD-10-CM | POA: Diagnosis not present

## 2018-06-04 DIAGNOSIS — R350 Frequency of micturition: Secondary | ICD-10-CM | POA: Diagnosis not present

## 2018-06-05 ENCOUNTER — Ambulatory Visit: Payer: Self-pay | Admitting: Psychiatry

## 2018-06-12 ENCOUNTER — Encounter: Payer: Self-pay | Admitting: Internal Medicine

## 2018-06-14 ENCOUNTER — Ambulatory Visit (INDEPENDENT_AMBULATORY_CARE_PROVIDER_SITE_OTHER): Payer: BLUE CROSS/BLUE SHIELD | Admitting: Psychiatry

## 2018-06-14 DIAGNOSIS — F411 Generalized anxiety disorder: Secondary | ICD-10-CM | POA: Diagnosis not present

## 2018-06-14 DIAGNOSIS — F900 Attention-deficit hyperactivity disorder, predominantly inattentive type: Secondary | ICD-10-CM | POA: Diagnosis not present

## 2018-06-14 DIAGNOSIS — F3110 Bipolar disorder, current episode manic without psychotic features, unspecified: Secondary | ICD-10-CM

## 2018-06-14 DIAGNOSIS — G3184 Mild cognitive impairment, so stated: Secondary | ICD-10-CM | POA: Diagnosis not present

## 2018-06-14 MED ORDER — DEXMETHYLPHENIDATE HCL 10 MG PO TABS: 10.0000 mg | ORAL_TABLET | ORAL | 0 refills | Status: DC | PRN

## 2018-06-14 MED ORDER — DEXMETHYLPHENIDATE HCL ER 10 MG PO CP24: 10.0000 mg | ORAL_CAPSULE | ORAL | 0 refills | Status: DC

## 2018-06-14 MED ORDER — DEXMETHYLPHENIDATE HCL ER 20 MG PO CP24: 20.0000 mg | ORAL_CAPSULE | Freq: Every day | ORAL | 0 refills | Status: DC

## 2018-06-14 NOTE — Progress Notes (Signed)
[facility] Med Check  Patient ID: NASHAE MAUDLIN,  MRN: [id number]  PCP: Celene Squibb, MD  Date of Evaluation: 06/14/2018 Time spent:40 minutes   HISTORY/CURRENT STATUS: HPI Called yesterday saying needed to be seen right away dt "10" concerns.  Racing thoughts, laughing uncontrollably, talking fast, cursing, driving erratically, agitated crying,.  Woke this way.  Sx resolved in an hour or so.  Then feels down and numb.  This is characteristic of her mania ultra brief.  Not that way yesterday and slept ok but was irritable and sound sensitivity about 1-2 weeks.  Post mania depressions can last weeks.  Overall better with Vraylar  Reviewed "10 things" 1. STM affecting work, repeats questions.2. Conc px. 3. Disorganized thinging. 4. 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 A couple of weeks ago. 5. Word finding problems   Individual Medical History/ Review of Systems: Changes? :Yes GI pain PCP dx ulcer.  Pending GI appt.  Allergies: Azithromycin; Penicillins; and Adhesive [tape]  Current Medications:  Current Outpatient Medications:  .  atorvastatin (LIPITOR) 10 MG tablet, Take 10 mg by mouth daily., Disp: , Rfl:  .  Carbamazepine (EQUETRO) 300 MG CP12, Take 300 mg by mouth at bedtime., Disp: , Rfl:  .  cariprazine (VRAYLAR) capsule, Take 3 mg by mouth daily., Disp: , Rfl:  .  dexmethylphenidate (FOCALIN XR) 20 MG 24 hr capsule, Take 20 mg by mouth daily., Disp: , Rfl:  .  dexmethylphenidate (FOCALIN) 10 MG tablet, Take 10 mg by mouth as needed., Disp: , Rfl:  .  Fe Cbn-Fe Gluc-FA-B12-C-DSS (FERRALET 90) 90-1 MG TABS, Take 1 tablet by mouth daily., Disp: , Rfl:  .  FLUOXETINE HCL PO, Take 60 mg by mouth daily. , Disp: , Rfl:  .  lamoTRIgine (LAMICTAL) 200 MG tablet, Take 200 mg by mouth 2 (two) times daily. , Disp: , Rfl:  .  lithium carbonate 150 MG capsule, Take 150 mg by mouth daily., Disp: , Rfl:  .  Multiple Vitamin (MULTIVITAMIN WITH MINERALS) TABS,  Take 1 tablet by mouth daily., Disp: , Rfl:  .  pantoprazole (PROTONIX) 40 MG tablet, Take 40 mg by mouth daily., Disp: , Rfl:  Medication Side Effects: Other: dry mouth  Family Medical/ Social History: Changes? Yes boss removed a project bc of her cognitive problems.  MENTAL HEALTH EXAM:  There were no vitals taken for this visit.There is no height or weight on file to calculate BMI.  General Appearance: Casual  Eye Contact:  Good  Speech:  Normal Rate  Volume:  Normal  Mood:  Dysphoric, manic agitation this am  Affect:  Restricted  Thought Process:  Goal Directed  Orientation:  Full (Time, Place, and Person)  Thought Content: WDL   Suicidal Thoughts:  No  Homicidal Thoughts:  No  Memory:  Recent  Judgement:  Fair  Insight:  Fair  Psychomotor Activity:  Normal  Concentration:  Concentration: Fair  Recall:  [place] of Knowledge: Good  Language: Good  Akathisia:  No  AIMS (if indicated): not done  Assets:  Desire for Improvement Housing Leisure Time Transportation Vocational/Educational  ADL's:  Intact  Cognition: WNL  Prognosis:  Fair    DIAGNOSES:    ICD-10-CM   1. Bipolar I disorder, most recent episode (or current) manic ([place]) F31.10   2. Mild cognitive impairment G31.84   3. Attention deficit hyperactivity disorder (ADHD), predominantly inattentive type F90.0   4. Generalized anxiety disorder F41.1  RECOMMENDATIONS:  Ms. Nichelson has a long history of unstable bipolar disorder with very rapid cycles such as the one she had today.  He is concerned that she will cycle from this brief manic burst into a depression.  Often her depressions are associated with suicidal ideation which is sometimes quite severe.  She is very difficult to stabilize for any length of time.  She decompensates with very little stress.   Disc risks with necessary polypharmacy.  NAC, N acetylcysteine, 600 mg capsules 1 twice a day or 2 each morning.  This is for word finding  problems.  I recommend [email or web address]  DDI discussed and CBZ and induction of metabolism.  This may mean that her carbamazepine and her Vraylar blood levels are lower than they were 2 or 3 months ago which could have caused her to have mood cycling. Therefore increase Equetro to 400mg  and or increase Vraylar.  She is fairly med sensitive. Will increase Vraylar to 4.5 mg bc can also protect against depression. We may need to increase the equal trial later.  Literature search shows little benefit from cognitive enhancers.  Greater benefit is likely by slightly increasing the Focalin though there is some risk of re-triggering manic symptoms.  However this should be mitigated by the increase in [place].   She is changing insurance plans at the end of the year and I emphasized the need to pick a plan that will cover Equetro and Arman Filter because she is so medication sensitive and has failed multiple other medications.      Purnell Shoemaker, MD

## 2018-06-14 NOTE — Patient Instructions (Addendum)
NAC, N acetylcysteine, 600 mg capsules 1 twice a day or 2 each morning.  This is for word finding problems.  I recommend [email or web address]  Add to samples of 1.5 mg Vraylar to the 3 mg Vraylar capsules to equal 4.5 mg of Vraylar every day.

## 2018-06-17 ENCOUNTER — Other Ambulatory Visit: Payer: Self-pay | Admitting: Psychiatry

## 2018-06-17 MED ORDER — DEXMETHYLPHENIDATE HCL 10 MG PO TABS: 10.0000 mg | ORAL_TABLET | Freq: Every evening | ORAL | 0 refills | Status: DC

## 2018-06-17 NOTE — Progress Notes (Signed)
Order error changed from prn to pm

## 2018-06-19 ENCOUNTER — Other Ambulatory Visit: Payer: Self-pay | Admitting: Psychiatry

## 2018-06-19 MED ORDER — DEXMETHYLPHENIDATE HCL 10 MG PO TABS: 10.0000 mg | ORAL_TABLET | Freq: Every evening | ORAL | 0 refills | Status: DC

## 2018-06-19 NOTE — Progress Notes (Signed)
Corrected Focalin xr 10 to ir 10 mg.

## 2018-06-20 ENCOUNTER — Ambulatory Visit: Payer: BLUE CROSS/BLUE SHIELD | Admitting: Psychiatry

## 2018-06-20 DIAGNOSIS — F3110 Bipolar disorder, current episode manic without psychotic features, unspecified: Secondary | ICD-10-CM | POA: Diagnosis not present

## 2018-06-20 NOTE — Progress Notes (Signed)
      [facility] Counselor/Therapist Progress Note   Patient ID: Megan Whitney, MRN: [id number]  Date: 06/20/2018  Timespent: 60 minutes  Treatment Type: Individual  Subjective:  Patient in today reporting recent manic episode and medication change per Dr. Clovis Pu.  Feeling better now but still experiencing some depressed mood, anxiety, work and home stress, using food to self-medicate frequently.  States her PCP has told her that she has developed an ulcer and has her taking OTC Pepcid, which patient reports is helping.  Patient expresses concern about her memory and how it has declined over past 3-4 years and has spoke with her med provider about this.  Has developed the habit of writing more things down, such as instructions at work, which helps her in remembering things.  Talked more about her attending Weight [employer] regularly and yet does not follow through with the needed behavior and thought changes to help stop the self-medicating with food.  Discussed this more at length today and is putting one strategy in place between now and next session.  Is anxious some due to fear of losing contact with grand-daughter once her family moves to [place] for their jobs.  Med changes seem to have been effective in treating most recent manic episode.  Denies any current SI/HI.    Interventions:Solution Focused and Supportive  Mental Status Exam:   Appearance:   Well Groomed     Behavior:  Appropriate and Sharing  Motor:  Normal  Speech/Language:   Normal Rate  Affect:  Appropriate  Mood:  anxious  Thought process:  Coherent  Thought content:    Logical  Perceptual disturbances:    Normal  Orientation:  Full (Time, Place, and Person)  Attention:  Good  Concentration:  Good  Memory:  Immediate  Fund of knowledge:   Good  Insight:    Good  Judgment:   Good  Impulse Control:  fair    Reported Symptoms: depressed mood, anxious, work stress and overwhelmed, home stress, fearing loss of  contact with 67yr old grand-daughter (moving to [place] in mid to late November)  Risk Assessment: Danger to Self:  No Self-injurious Behavior: No Danger to Others: No Duty to Warn:no Physical Aggression / Violence:No  Access to Firearms a concern: No  Gang Involvement:No   Diagnosis:   ICD-10-CM   1. Bipolar I disorder, most recent episode (or current) manic ([place]) F31.10      Plan: Plan to see patient in 2-3 weeks.  Shanon Ace, LCSW

## 2018-06-21 ENCOUNTER — Other Ambulatory Visit: Payer: Self-pay

## 2018-06-21 MED ORDER — LITHIUM CARBONATE 150 MG PO CAPS: 150.0000 mg | ORAL_CAPSULE | Freq: Every day | ORAL | 1 refills | Status: DC

## 2018-07-04 ENCOUNTER — Ambulatory Visit: Payer: BLUE CROSS/BLUE SHIELD | Admitting: Psychiatry

## 2018-07-16 DIAGNOSIS — H43813 Vitreous degeneration, bilateral: Secondary | ICD-10-CM | POA: Diagnosis not present

## 2018-07-16 DIAGNOSIS — H3589 Other specified retinal disorders: Secondary | ICD-10-CM | POA: Diagnosis not present

## 2018-07-18 ENCOUNTER — Ambulatory Visit: Payer: BLUE CROSS/BLUE SHIELD | Admitting: Internal Medicine

## 2018-07-19 ENCOUNTER — Ambulatory Visit: Payer: BLUE CROSS/BLUE SHIELD | Admitting: Psychiatry

## 2018-07-19 DIAGNOSIS — F3110 Bipolar disorder, current episode manic without psychotic features, unspecified: Secondary | ICD-10-CM

## 2018-07-19 NOTE — Progress Notes (Signed)
      [facility] Counselor/Therapist Progress Note   Patient ID: Megan Whitney, MRN: [id number]  Date: 07/19/2018  Timespent:  70minutes   Treatment Type: Individual   Reported Symptoms:  "some depression", "severe anxiety", sadness, frustration, fatigue, "overly focused on health"   Mental Status Exam:    Appearance:   Neat     Behavior:  Appropriate and Sharing  Motor:  Normal  Speech/Language:   Normal Rate  Affect:  Depressed  Mood:  anxious and depressed  Thought process:  normal  Thought content:    WNL  Sensory/Perceptual disturbances:    WNL  Orientation:  oriented to person, place, time/date, situation, day of week, month of year and year  Attention:  fair  Concentration:  Fair  Memory:  Patient reports short term memory issues   Fund of knowledge:   good  Insight:    Good  Judgment:   Good  Impulse Control:  Good     Risk Assessment: Danger to Self:  No Self-injurious Behavior: No Danger to Others: No Duty to Warn:no Physical Aggression / Violence:No  Access to Firearms a concern: No  Gang Involvement:No    Subjective: Patient in today reporting severe anxiety as well as the other symptoms above.  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 Worked today on how to set better limits and boundaries with people she finds difficult to work with, and also family members.  Also reviewed active listening skills so she really listens to people to hear what they're saying and not just to respond to them. Tends to give "stronger and more threatening people a lot of power" and is beginning to see how she feeds the situation by giving them unrealistic power over her and how she feels.  Patient does show good amount of self awareness.  Took notes during session today to help her as she follows up on discussion re: behavioral changes she is to work on.   Interventions: Solution-Oriented/Positive Psychology and  Ego-Supportive   Diagnosis:   ICD-10-CM   1. Bipolar I disorder, most recent episode (or current) manic ([place]) F31.10      Plan:   Patient to work on implementing better limits and boundaries especially at work.  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  Encouraged self-care including getting adequate sleep, healthy eating, physical activity, contact with friends, and positive self-talk (examples provided).    Shanon Ace, LCSW

## 2018-07-24 ENCOUNTER — Ambulatory Visit: Payer: BLUE CROSS/BLUE SHIELD | Admitting: Psychiatry

## 2018-07-24 DIAGNOSIS — F3110 Bipolar disorder, current episode manic without psychotic features, unspecified: Secondary | ICD-10-CM | POA: Diagnosis not present

## 2018-07-24 NOTE — Progress Notes (Signed)
      [facility] Counselor/Therapist Progress Note   Patient ID: Megan Whitney, MRN: [id number]  Date: 07/24/2018  Timespent: 60 minutes   Treatment Type: Individual   Reported Symptoms: anxiety, angry, teary, difficulty concentrating,    Mental Status Exam:    Appearance:   Neat     Behavior:  Appropriate and Sharing  Motor:  Normal  Speech/Language:   Normal Rate  Affect:  Blunt  Mood:  anxious and depressed  Thought process:  normal  Thought content:    WNL  Sensory/Perceptual disturbances:    WNL  Orientation:  oriented to person, place, time/date, situation, day of week, month of year and year  Attention:  Good  Concentration:  Good  Memory:  WNL  Fund of knowledge:   Good  Insight:    Good  Judgment:   Good  Impulse Control:  Good     Risk Assessment: Danger to Self:  No Self-injurious Behavior: No Danger to Others: No Duty to Warn:no Physical Aggression / Violence:No  Access to Firearms a concern: No  Gang Involvement:No    Subjective: Patient in today with mix of depression and anxiety.  Hurt by some of husband's talk that really embarassed her.  She spoke with him later about it and actually did a good job of being direct.  Stressed with family (including youngest grandchild) moving to [place], [place] shortly after Thanksgiving.  Will be visiting patient over Thanksgiving holiday, which patient is really looking forward to.  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  Husband's drinking remains excessive, and patient reports she tends to back off from him during his worse times, and rely on info she learned through Al-Anon.  Concerned re: a grandson Gerald Stabs, 88, made threatening remarks and is receiving behavior health tx)  Talked through her feelings/concerns on this.     Interventions: Solution-Oriented/Positive Psychology and Ego-Supportive   Diagnosis:   ICD-10-CM   1. Bipolar I  disorder, most recent episode (or current) manic ([place]) F31.10      Plan:  With a lot going on at work and within family, patient urged to work on "staying in the present and not jumping ahead to future worries/concerns".  Reframed her view of some of the upcoming transitions, esp the move of the youngest grandchild's family.  Seems to be doing better job in managing some of her work stress; to be more mindful of when she needs to set boundaries in order to "stay out of the middle in situations that are not directly related to patient."     Shanon Ace, LCSW

## 2018-07-26 ENCOUNTER — Ambulatory Visit: Payer: BLUE CROSS/BLUE SHIELD | Admitting: Psychiatry

## 2018-07-26 ENCOUNTER — Encounter: Payer: Self-pay | Admitting: Psychiatry

## 2018-07-26 DIAGNOSIS — F319 Bipolar disorder, unspecified: Secondary | ICD-10-CM | POA: Diagnosis not present

## 2018-07-26 DIAGNOSIS — R7989 Other specified abnormal findings of blood chemistry: Secondary | ICD-10-CM

## 2018-07-26 DIAGNOSIS — G3184 Mild cognitive impairment, so stated: Secondary | ICD-10-CM

## 2018-07-26 DIAGNOSIS — G251 Drug-induced tremor: Secondary | ICD-10-CM

## 2018-07-26 DIAGNOSIS — F411 Generalized anxiety disorder: Secondary | ICD-10-CM | POA: Diagnosis not present

## 2018-07-26 MED ORDER — DEXMETHYLPHENIDATE HCL 10 MG PO TABS: 10.0000 mg | ORAL_TABLET | Freq: Every evening | ORAL | 0 refills | Status: DC

## 2018-07-26 MED ORDER — DEXMETHYLPHENIDATE HCL ER 20 MG PO CP24: 20.0000 mg | ORAL_CAPSULE | Freq: Every day | ORAL | 0 refills | Status: DC

## 2018-07-26 MED ORDER — CARIPRAZINE HCL 4.5 MG PO CAPS: 4.5000 mg | ORAL_CAPSULE | Freq: Every day | ORAL | 2 refills | Status: DC

## 2018-07-26 MED ORDER — PROPRANOLOL HCL 10 MG PO TABS: 10.0000 mg | ORAL_TABLET | Freq: Every day | ORAL | 0 refills | Status: DC | PRN

## 2018-07-26 MED ORDER — DEXMETHYLPHENIDATE HCL ER 20 MG PO CP24: 20.0000 mg | ORAL_CAPSULE | ORAL | 0 refills | Status: DC

## 2018-07-26 NOTE — Progress Notes (Signed)
Megan Whitney [id number] 01-29-1956 62 y.o.  Subjective:   Patient ID:  Megan Whitney is a 62 y.o. (DOB December 31, 1955) female.  Chief Complaint:  Chief Complaint  Patient presents with  . Follow-up    med change and mood swings  . Anxiety    HPI Megan Whitney presents to the office today for follow-up of recent mood swings and the increase in Vraylar to 4.5mg  daily.   Doing very well since the increase without major swings but little dips with depression, nothing major and no mania. Has noticed more tremor in her hands and worse in the morning. Tremor is noticeable and embarrassing but doesn't want to stop the Vraylar DT the benefit for her mood.  Her sleep is adequate.  She is functioning at work.  No overt anger outbursts.  Seasonally the worst time of year for her and she doesn't want to change.  She is having more cognitive problems which she associates with this time of year as well.  Review of Systems:  Review of Systems  Neurological: Positive for tremors. Negative for weakness.  Psychiatric/Behavioral: Negative for agitation, behavioral problems, confusion, decreased concentration, dysphoric mood, hallucinations, self-injury, sleep disturbance and suicidal ideas. The patient is nervous/anxious. The patient is not hyperactive.     Medications: I have reviewed the patient's current medications.  Current Outpatient Medications  Medication Sig Dispense Refill  . atorvastatin (LIPITOR) 10 MG tablet Take 10 mg by mouth daily.    . Carbamazepine (EQUETRO) 300 MG CP12 Take 300 mg by mouth at bedtime.    . Cariprazine HCl (VRAYLAR) 4.5 MG CAPS Take 1 capsule (4.5 mg total) by mouth daily. 30 capsule 2  . Cholecalciferol ([employer] VITAMIN D3) 250 MCG (10000 UT) CAPS Take 10,000 Units by mouth daily.    Marland Kitchen dexmethylphenidate (FOCALIN XR) 20 MG 24 hr capsule Take 1 capsule (20 mg total) by mouth every morning. 30 capsule 0  . dexmethylphenidate (FOCALIN) 10 MG tablet Take 1 tablet (10  mg total) by mouth every evening. 30 tablet 0  . Fe Cbn-Fe Gluc-FA-B12-C-DSS (FERRALET 90) 90-1 MG TABS Take 1 tablet by mouth daily.    Marland Kitchen FLUoxetine (PROZAC) 20 MG capsule Take 60 mg by mouth daily.     Marland Kitchen lamoTRIgine (LAMICTAL) 200 MG tablet Take 200 mg by mouth 2 (two) times daily.     Marland Kitchen lithium carbonate 150 MG capsule Take 1 capsule (150 mg total) by mouth daily. 90 capsule 1  . Multiple Vitamin (MULTIVITAMIN WITH MINERALS) TABS Take 1 tablet by mouth daily.    . pantoprazole (PROTONIX) 40 MG tablet Take 40 mg by mouth daily.    Derrill Memo ON 08/23/2018] dexmethylphenidate (FOCALIN XR) 20 MG 24 hr capsule Take 1 capsule (20 mg total) by mouth daily. 30 capsule 0  . [START ON 09/20/2018] dexmethylphenidate (FOCALIN XR) 20 MG 24 hr capsule Take 1 capsule (20 mg total) by mouth daily. 30 capsule 0  . dexmethylphenidate (FOCALIN) 10 MG tablet Take 1 tablet (10 mg total) by mouth every evening. 30 tablet 0  . [START ON 08/23/2018] dexmethylphenidate (FOCALIN) 10 MG tablet Take 1 tablet (10 mg total) by mouth every evening. 30 tablet 0  . [START ON 09/20/2018] dexmethylphenidate (FOCALIN) 10 MG tablet Take 1 tablet (10 mg total) by mouth every evening. 30 tablet 0  . propranolol (INDERAL) 10 MG tablet Take 1-3 tablets (10-30 mg total) by mouth daily as needed (tremor). 90 tablet 0   No current facility-administered medications for  this visit.     Medication Side Effects: Other: tremor and weight gain.  Allergies:  Allergies  Allergen Reactions  . Azithromycin Anaphylaxis  . Penicillins Anaphylaxis  . Adhesive [Tape]     On bandaids    Past Medical History:  Diagnosis Date  . Anemia    History of GI blood loss  . Arthritis   . Bipolar 1 disorder ([place])   . Colon polyps   . Depression   . Epistaxis    Around 2011 or 2012, required cauterization.   . Esophageal stricture   . GERD (gastroesophageal reflux disease)   . Headache(784.0)   . Hyperlipidemia   . Interstitial cystitis   .  Lung cancer ([place]) 2002  . Sleep apnea     Family History  Problem Relation Age of Onset  . Arthritis Mother   . Hypertension Father   . Diabetes Mellitus II Father   . Heart disease Father     Social History   Socioeconomic History  . Marital status: Married    Spouse name: Not on file  . Number of children: Not on file  . Years of education: Not on file  . Highest education level: Not on file  Occupational History  . Not on file  Social Needs  . Financial resource strain: Not on file  . Food insecurity:    Worry: Not on file    Inability: Not on file  . Transportation needs:    Medical: Not on file    Non-medical: Not on file  Tobacco Use  . Smoking status: Never Smoker  . Smokeless tobacco: Never Used  Substance and Sexual Activity  . Alcohol use: Yes    Alcohol/week: 1.0 standard drinks    Types: 1 Glasses of wine per week    Comment: Moderate  . Drug use: No  . Sexual activity: Not on file  Lifestyle  . Physical activity:    Days per week: Not on file    Minutes per session: Not on file  . Stress: Not on file  Relationships  . Social connections:    Talks on phone: Not on file    Gets together: Not on file    Attends religious service: Not on file    Active member of club or organization: Not on file    Attends meetings of clubs or organizations: Not on file    Relationship status: Not on file  . Intimate partner violence:    Fear of current or ex partner: Not on file    Emotionally abused: Not on file    Physically abused: Not on file    Forced sexual activity: Not on file  Other Topics Concern  . Not on file  Social History Narrative  . Not on file    Past Medical History, Surgical history, Social history, and Family history were reviewed and updated as appropriate.   Please see review of systems for further details on the patient's review from today.   Objective:   Physical Exam:  There were no vitals taken for this visit.  Physical Exam   Constitutional: She is oriented to person, place, and time. She appears well-developed. No distress.  Musculoskeletal: She exhibits no deformity.  Neurological: She is alert and oriented to person, place, and time. She displays tremor. Coordination and gait normal.  Psychiatric: She has a normal mood and affect. Her speech is normal and behavior is normal. Judgment and thought content normal. Her mood appears not anxious. Her  affect is not angry, not blunt, not labile and not inappropriate. She is not actively hallucinating. Thought content is not paranoid. Cognition and memory are normal. She does not exhibit a depressed mood. She expresses no homicidal and no suicidal ideation. She expresses no suicidal plans and no homicidal plans.  Insight intact. No auditory or visual hallucinations. Mood more stable with the increase in the Vraylar..  She is attentive.    Lab Review:     Component Value Date/Time   NA 136 05/15/2012 1020   K 3.5 05/15/2012 1020   CL 103 05/15/2012 1020   CO2 25 05/15/2012 1020   GLUCOSE 137 (H) 05/15/2012 1020   BUN 12 05/15/2012 1020   CREATININE 0.77 05/15/2012 1020   CALCIUM 8.7 05/15/2012 1020   PROT 6.5 05/14/2012 1404   ALBUMIN 3.4 (L) 05/14/2012 1404   AST 21 05/14/2012 1404   ALT 19 05/14/2012 1404   ALKPHOS 67 05/14/2012 1404   BILITOT 0.3 05/14/2012 1404   GFRNONAA >90 05/15/2012 1020   GFRAA >90 05/15/2012 1020       Component Value Date/Time   WBC 11.0 (H) 05/16/2012 0605   RBC 3.19 (L) 05/16/2012 0605   HGB 8.5 (L) 05/17/2012 0854   HCT 28.4 (L) 05/17/2012 0854   PLT 289 05/16/2012 0605   MCV 72.7 (L) 05/16/2012 0605   MCH 21.3 (L) 05/16/2012 0605   MCHC 29.3 (L) 05/16/2012 0605   RDW 22.1 (H) 05/16/2012 0605   LYMPHSABS 1.8 05/14/2012 1404   MONOABS 1.1 (H) 05/14/2012 1404   EOSABS 0.1 05/14/2012 1404   BASOSABS 0.0 05/14/2012 1404    No results found for: POCLITH, LITHIUM   No results found for: PHENYTOIN, PHENOBARB, VALPROATE,  CBMZ   .res Assessment: Plan:    Bipolar I disorder with rapid cycling ([place])  Generalized anxiety disorder  Mild cognitive impairment  Tremor due to multiple drugs  Low vitamin D level - Plan: Vitamin D 1,25 dihydroxy  Megan Whitney has chronic rapid cycling bipolar disorder which is chronically unstable and has been difficult to control.  In September her fluoxetine was increased to 60 mg a day due to tearfulness.  She has however had more rapid cycling.  The increase in fluoxetine did help with the tearfulness but may have contributed to the rapid cycling.  We need to consider reducing the fluoxetine after the holidays.  Her rapid cycling is somewhat improved with a higher dose of Vraylar but she has developed a tremor.  Consider B6 for tremor but propranolol is likely more reliable. Propranolol trial for tremor 10-30mg  daily disc in detail and the SE.  Check vitamin D level as it's a risk factor for seasonal depression and pt is at risk of malabsorption.  Discussed potential metabolic side effects associated with atypical antipsychotics, as well as potential risk for movement side effects. Advised pt to contact office if movement side effects occur.  Ordered 3 months of refills of Focalin and Focalin XR  This appointment was 35 minutes.  Follow-up 8 weeks and consider reducing the fluoxetine due to the rapid cycling  Lynder Parents MD, DFAPA Please see After Visit Summary for patient specific instructions.  Future Appointments  Date Time Provider Kevin  08/17/2018  2:00 PM Shanon Ace, LCSW [facility] None  08/20/2018  8:45 AM Irene Shipper, MD [facility]  09/07/2018  8:00 AM Shanon Ace, LCSW [facility] None  09/24/2018  9:00 AM Shanon Ace, LCSW [facility] None  09/24/2018 11:00 AM Clovis Pu, Allayne Butcher  Brooke Bonito., MD [facility] None    Orders Placed This Encounter  Procedures  . Vitamin D 1,25 dihydroxy      -------------------------------

## 2018-08-03 DIAGNOSIS — R7989 Other specified abnormal findings of blood chemistry: Secondary | ICD-10-CM | POA: Diagnosis not present

## 2018-08-03 DIAGNOSIS — E559 Vitamin D deficiency, unspecified: Secondary | ICD-10-CM | POA: Diagnosis not present

## 2018-08-08 ENCOUNTER — Ambulatory Visit: Payer: BLUE CROSS/BLUE SHIELD | Admitting: Psychiatry

## 2018-08-08 LAB — VITAMIN D 1,25 DIHYDROXY
VITAMIN D3 1, 25 (OH): 33 pg/mL
Vitamin D 1, 25 (OH)2 Total: 33 pg/mL

## 2018-08-10 ENCOUNTER — Other Ambulatory Visit: Payer: Self-pay | Admitting: Psychiatry

## 2018-08-10 MED ORDER — CHOLECALCIFEROL 1.25 MG (50000 UT) PO CAPS: 50000.0000 [IU] | ORAL_CAPSULE | ORAL | 5 refills | Status: DC

## 2018-08-10 NOTE — Progress Notes (Signed)
Vitamin D level 33 on 10K units daily. Increase to prescription vitamin d 50K units Monday, Wed, Friday.  Rx sent in.  Please notify patient.

## 2018-08-13 DIAGNOSIS — H3589 Other specified retinal disorders: Secondary | ICD-10-CM | POA: Diagnosis not present

## 2018-08-13 DIAGNOSIS — H43813 Vitreous degeneration, bilateral: Secondary | ICD-10-CM | POA: Diagnosis not present

## 2018-08-13 NOTE — Progress Notes (Signed)
Left voice mail to call back 

## 2018-08-14 ENCOUNTER — Telehealth: Payer: Self-pay | Admitting: Psychiatry

## 2018-08-14 NOTE — Telephone Encounter (Signed)
Noted and took [facility] out of her pharmacy list.

## 2018-08-14 NOTE — Telephone Encounter (Signed)
Just FYI for chart, Pt always wants  [place] to RX to [facility] [address] in [place], [place].  [facility]. Please only use [facility].

## 2018-08-14 NOTE — Progress Notes (Signed)
Given information and has been notified by pharmacy on medication.

## 2018-08-17 ENCOUNTER — Ambulatory Visit: Payer: BLUE CROSS/BLUE SHIELD | Admitting: Psychiatry

## 2018-08-17 DIAGNOSIS — F319 Bipolar disorder, unspecified: Secondary | ICD-10-CM

## 2018-08-17 NOTE — Progress Notes (Signed)
      [facility] Counselor/Therapist Progress Note  Patient ID: Megan Whitney, MRN: [id number],    Date: 08/17/2018  Time Spent: 70 minutes  Treatment Type: Individual Therapy  Reported Symptoms:  Irritable, sadness, depressed, anxious, waking up some during night and then goes back to sleep.   Mental Status Exam:  Appearance:   Casual     Behavior:  Appropriate and Sharing  Motor:  Normal  Speech/Language:   Clear and Coherent  Affect:  Congruent  Mood:  anxious, depressed and sad  Thought process:  normal  Thought content:    WNL  Sensory/Perceptual disturbances:    WNL  Orientation:  oriented to person, place, time/date, situation, day of week, month of year and year  Attention:  Good  Concentration:  Good  Memory:  Immediate;   Fair Recent;   [place] of knowledge:   good  Insight:    Fair  Judgment:   Fair; Poor on 1 occasion of drinking excessively since last appt  Impulse Control:  Poor   Risk Assessment: Danger to Self:  No Self-injurious Behavior: No Danger to Others: No Duty to Warn:no Physical Aggression / Violence:No  Access to Firearms a concern: No  Gang Involvement:No   Subjective: Patient presents today with symptoms of anxiety, irritability, not sleeping as well on most nights (wakes up and then goes back to sleep), "depression/sadness" due to step-daughter's family moving to [place] last weekend (includes a 20yr old grand-daughter with whom patient is close), not really sticking with her regular routine of church, weight watchers, etc but has connected with her knitting group.  No physical activity so far which we talked about last visit.  Reports that she is taking all her meds as prescribed including the more recently added Vitamin D.  Is having some gastrointestinal issues for which she has an appt at her PCP's office this Saturday morning.  To see nurse practitioner there first.  Also had an incident on Nov. 30th, since last therapy and Dr. appt  where she drank heavily at family event, requiring her to have assistance getting to the car and remembered very little of that night.  Has not happened again since then. States that it had been yrs since she had drank that much before.  Feels she did it to cope with accumulated stressors at work and with part of family moving to [place].  On 1-10 scale of depression, with 10 being most depressed, patient puts herself as a "7" today. Talked about changes she can make to help with her depressed mood and anxiety.  Urged her to pick backup with her normal routine of activies, get some physical activity into her schedule, stay on meds as prescribed by Dr., and be in touch with family members that recently moved to [place].  Practice looking for what may go right versus wrong.  Practice with the CBT strategies we reviewed today.  Start back using her c-pap machine at night.    Interventions: Cognitive Behavioral Therapy, Solution-Oriented/Positive Psychology and Ego-Supportive  Diagnosis:   ICD-10-CM   1. Bipolar I disorder with rapid cycling ([place]) F31.9     Plan:  Patient to follow through on strategies discussed today as noted above.  To return to see me Jan. 3rd and Jan. 20th.   Helping patient get an earlier appt with Dr. Clovis Pu.  Shanon Ace, LCSW

## 2018-08-18 DIAGNOSIS — R142 Eructation: Secondary | ICD-10-CM | POA: Diagnosis not present

## 2018-08-18 DIAGNOSIS — K219 Gastro-esophageal reflux disease without esophagitis: Secondary | ICD-10-CM | POA: Diagnosis not present

## 2018-08-18 DIAGNOSIS — R112 Nausea with vomiting, unspecified: Secondary | ICD-10-CM | POA: Diagnosis not present

## 2018-08-18 DIAGNOSIS — R1011 Right upper quadrant pain: Secondary | ICD-10-CM | POA: Diagnosis not present

## 2018-08-20 ENCOUNTER — Encounter: Payer: Self-pay | Admitting: Internal Medicine

## 2018-08-20 ENCOUNTER — Ambulatory Visit: Payer: BLUE CROSS/BLUE SHIELD | Admitting: Internal Medicine

## 2018-08-20 VITALS — BP 120/78 | HR 76 | Ht 59.75 in | Wt 166.0 lb

## 2018-08-20 DIAGNOSIS — K219 Gastro-esophageal reflux disease without esophagitis: Secondary | ICD-10-CM | POA: Diagnosis not present

## 2018-08-20 DIAGNOSIS — Z86010 Personal history of colonic polyps: Secondary | ICD-10-CM

## 2018-08-20 DIAGNOSIS — K222 Esophageal obstruction: Secondary | ICD-10-CM | POA: Diagnosis not present

## 2018-08-20 DIAGNOSIS — R1310 Dysphagia, unspecified: Secondary | ICD-10-CM | POA: Diagnosis not present

## 2018-08-20 DIAGNOSIS — R1011 Right upper quadrant pain: Secondary | ICD-10-CM

## 2018-08-20 DIAGNOSIS — R1031 Right lower quadrant pain: Secondary | ICD-10-CM

## 2018-08-20 MED ORDER — NA SULFATE-K SULFATE-MG SULF 17.5-3.13-1.6 GM/177ML PO SOLN: 1.0000 | Freq: Once | ORAL | 0 refills | Status: AC

## 2018-08-20 NOTE — Patient Instructions (Signed)
You have been scheduled for an abdominal ultrasound at [facility] (1st floor of hospital) on 08/24/2019 at 7:00am. Please arrive 15 minutes prior to your appointment for registration. Make certain not to have anything to eat or drink 6 hours prior to your appointment. Should you need to reschedule your appointment, please contact radiology at [phone]. This test typically takes about 30 minutes to perform.  You have been scheduled for an endoscopy and colonoscopy. Please follow the written instructions given to you at your visit today. Please pick up your prep supplies at the pharmacy within the next 1-3 days. If you use inhalers (even only as needed), please bring them with you on the day of your procedure. Your physician has requested that you go to www.startemmi.com and enter the access code given to you at your visit today. This web site gives a general overview about your procedure. However, you should still follow specific instructions given to you by our office regarding your preparation for the procedure.

## 2018-08-20 NOTE — Progress Notes (Signed)
HISTORY OF PRESENT ILLNESS:  Megan Whitney is a 62 y.o. female with a history of recurrent iron deficiency anemia secondary to large hiatal hernia with associated [place] erosions, GERD complicated by peptic stricture requiring esophageal dilation, and adenomatous colon polyps.  Patient has not been seen in this office since June 27, 2012.  She also has a remote history of lung cancer status post wedge resection and bipolar disorder.  Her previous GI work-ups for iron deficiency anemia have included colonoscopy, upper endoscopy, and capsule endoscopy.  She presents today with chief complaints of right lower quadrant pain, recurrent dysphasia, and the need for surveillance colonoscopy.  Patient tells me that she has had intermittent right lower quadrant pain for approximately 6 months.  She describes this as severe at times associated with bloating.  The frequency has increased from once monthly to several times per week.  Typically lasts 1 to 2 hours.  Seems to be exacerbated by fatty meals.  Seems to be relieved by expelling gas or belching.  Symptoms occur most often in the evening after her meal.  No nocturnal symptoms.  There has been no weight loss.  No bleeding.  She has not been evaluated for this issue.  She does stay on iron regularly as previously advised.  She tells me that her blood counts have been acceptable.  Review of outside blood work from August 03, 2018 shows vitamin D level of 33.  Patient tells me that she does take pantoprazole and famotidine for GERD.  For the most part this controls classic reflux symptoms.  However, she has noticed some recurrent intermittent solid food dysphasia.  This has occurred over the past few years.  Finally.  She has a history of adenomatous colon polyps.  Her last colonoscopy January 2012.  Follow-up in 5 years recommended.  Finally, she does mention some fleeting right upper quadrant pain for which her primary care provider suggested abdominal  ultrasound, but this was not ordered.  REVIEW OF SYSTEMS:  All non-GI ROS negative unless otherwise stated in the HPI except for anxiety, confusion, depression, fatigue, heart rhythm changes, night sweats, shortness of breath, urinary leakage  Past Medical History:  Diagnosis Date  . Anemia    History of GI blood loss  . Anxiety   . Arthritis   . Bipolar 1 disorder ([place])   . Colon polyps   . Depression   . Epistaxis    Around 2011 or 2012, required cauterization.   . Esophageal stricture   . GERD (gastroesophageal reflux disease)   . Headache(784.0)   . Hyperlipidemia   . Interstitial cystitis   . Lung cancer ([place]) 2002  . Obesity   . Sleep apnea    Doesn't use a CPAP    Past Surgical History:  Procedure Laterality Date  . BALLOON DILATION  05/16/2012   Procedure: BALLOON DILATION;  Surgeon: Inda Castle, MD;  Location: [facility];  Service: Endoscopy;  Laterality: N/A;  . COLONOSCOPY    . ENTEROSCOPY  05/16/2012   Procedure: ENTEROSCOPY;  Surgeon: Inda Castle, MD;  Location: [facility];  Service: Endoscopy;  Laterality: N/A;  . JOINT REPLACEMENT    . TOTAL HIP ARTHROPLASTY     bilateral  . TUBAL LIGATION    . WEDGE RESECTION  2002   lung cancer    Social History Megan Whitney  reports that she has never smoked. She has never used smokeless tobacco. She reports current alcohol use of about 1.0 standard drinks  of alcohol per week. She reports that she does not use drugs.  family history includes Arthritis in her mother; Diabetes Mellitus II in her father; Heart disease in her father; Hypertension in her father.  Allergies  Allergen Reactions  . Azithromycin Anaphylaxis  . Penicillins Anaphylaxis    Per pt she can take Amoxicillin  . Adhesive [Tape]     On bandaids       PHYSICAL EXAMINATION: Vital signs: BP 120/78   Pulse 76   Ht 4' 11.75" (1.518 m)   Wt 166 lb (75.3 kg)   BMI 32.69 kg/m   Constitutional: generally well-appearing, no acute  distress Psychiatric: alert and oriented x3, cooperative Eyes: extraocular movements intact, anicteric, conjunctiva pink Mouth: oral pharynx moist, no lesions Neck: supple no lymphadenopathy Cardiovascular: heart regular rate and rhythm, no murmur Lungs: clear to auscultation bilaterally Abdomen: soft, obese, nontender, nondistended, no obvious ascites, no peritoneal signs, normal bowel sounds, no organomegaly Rectal: Deferred until colonoscopy Extremities: no clubbing, cyanosis, or lower extremity edema bilaterally Skin: no lesions on visible extremities Neuro: No focal deficits.  Cranial nerves intact  ASSESSMENT:  1.  74-month history of right lower quadrant discomfort in the evening after meals relieved with passing flatus or belching.  Most consistent with gas bloat 2.  Intermittent fleeting right upper quadrant pain.  No alarm features 3.  GERD.  Ongoing 4.  Recurrent dysphasia likely secondary to known peptic stricture 5.  History of iron deficiency anemia secondary to [facility] erosions 6.  History of adenomatous colon polyps.  Last colonoscopy 2012 (polyp removed but no tissue available for submission).  Index exam 2002 with adenoma and negative follow-up 2009.   PLAN:  1.  Reflux precautions 2.  Continue PPI and H2 receptor antagonist therapy 3.  Schedule upper endoscopy with probable esophageal dilation.The nature of the procedure, as well as the risks, benefits, and alternatives were carefully and thoroughly reviewed with the patient. Ample time for discussion and questions allowed. The patient understood, was satisfied, and agreed to proceed. 4.  Schedule abdominal ultrasound to evaluate right-sided pain of the upper and lower abdomen 5.  Schedule surveillance colonoscopy.The nature of the procedure, as well as the risks, benefits, and alternatives were carefully and thoroughly reviewed with the patient. Ample time for discussion and questions allowed. The patient understood,  was satisfied, and agreed to proceed. 6.  Ongoing general medical care with PCP

## 2018-08-22 ENCOUNTER — Ambulatory Visit: Payer: BLUE CROSS/BLUE SHIELD | Admitting: Psychiatry

## 2018-08-23 ENCOUNTER — Ambulatory Visit (HOSPITAL_COMMUNITY)
Admission: RE | Admit: 2018-08-23 | Discharge: 2018-08-23 | Disposition: A | Discharge status: Home / Self Care | Payer: BLUE CROSS/BLUE SHIELD | Source: Ambulatory Visit | Attending: Internal Medicine | Admitting: Internal Medicine

## 2018-08-23 DIAGNOSIS — R1310 Dysphagia, unspecified: Secondary | ICD-10-CM

## 2018-08-23 DIAGNOSIS — R1031 Right lower quadrant pain: Secondary | ICD-10-CM | POA: Diagnosis not present

## 2018-08-23 DIAGNOSIS — K219 Gastro-esophageal reflux disease without esophagitis: Secondary | ICD-10-CM | POA: Diagnosis not present

## 2018-08-23 DIAGNOSIS — Z86010 Personal history of colonic polyps: Secondary | ICD-10-CM | POA: Diagnosis not present

## 2018-08-23 DIAGNOSIS — K222 Esophageal obstruction: Secondary | ICD-10-CM

## 2018-08-23 DIAGNOSIS — R109 Unspecified abdominal pain: Secondary | ICD-10-CM | POA: Diagnosis not present

## 2018-08-23 DIAGNOSIS — N281 Cyst of kidney, acquired: Secondary | ICD-10-CM | POA: Insufficient documentation

## 2018-08-23 DIAGNOSIS — R1032 Left lower quadrant pain: Secondary | ICD-10-CM | POA: Diagnosis not present

## 2018-08-24 ENCOUNTER — Telehealth: Payer: Self-pay | Admitting: Internal Medicine

## 2018-08-24 NOTE — Telephone Encounter (Signed)
Discussed with pt that Dr. Henrene Pastor is out of the office until after Christmas. Explained once he has reviewed the result then she will be called.

## 2018-08-26 ENCOUNTER — Other Ambulatory Visit: Payer: Self-pay | Admitting: Psychiatry

## 2018-08-27 ENCOUNTER — Telehealth: Payer: Self-pay | Admitting: Internal Medicine

## 2018-08-27 ENCOUNTER — Ambulatory Visit: Payer: BLUE CROSS/BLUE SHIELD | Admitting: Psychiatry

## 2018-08-27 NOTE — Telephone Encounter (Signed)
Let pt know that per the OV note the next step would be the EGD and colon that are scheduled and to keep those appts.

## 2018-09-07 ENCOUNTER — Ambulatory Visit: Payer: BLUE CROSS/BLUE SHIELD | Admitting: Psychiatry

## 2018-09-07 DIAGNOSIS — F319 Bipolar disorder, unspecified: Secondary | ICD-10-CM

## 2018-09-07 NOTE — Progress Notes (Signed)
      [facility] Counselor/Therapist Progress Note  Patient ID: EMARY ZALAR, MRN: [id number],    Date: 09/07/2018  Time Spent:  60 minutes  Treatment Type: Individual Therapy  Reported Symptoms:  Anxiety, depression, impatient, anger  Mental Status Exam:  Appearance:   Neat     Behavior:  Appropriate and Sharing  Motor:  Normal  Speech/Language:   Normal Rate  Affect:  Congruent  Mood:  anxious, depressed and irritable  Thought process:  normal  Thought content:    WNL  Sensory/Perceptual disturbances:    WNL  Orientation:  oriented to person, place, time/date, situation, day of week, month of year and year  Attention:  Fair  Concentration:  Fair  Memory:  Patient reports some occasional "forgetfullness with immediate and recent memory".  Fund of knowledge:   Good  Insight:    Good  Judgment:   Good  Impulse Control:  Fair   Risk Assessment: Danger to Self:  No Self-injurious Behavior: No Danger to Others: No Duty to Warn:no Physical Aggression / Violence:No  Access to Firearms a concern: No  Gang Involvement:No   Subjective: Patient  In today with symptoms of depression, anxiety, angry, and feeling impatient.  Frustrated, angry, and irritable about step-daughter, husband, and young grand-daughter moving to [place]---frustrated at the situation not the people.  Really misses them and is trying to work out a visit soon.  Husband's drinking "continues as usual but has not had any major conflicts with him recently."  States she lives with depression all the time, sometimes it is back in the background.  Self esteem, confidence, motivation are all low and doesn't feel she can get healthier and blames her husband for that.  Confronted on this issue and encouraged her to look at how to not give power to husband, and rather claim and own the power she has for herself. Talked about ways she can make changes to follow through on her desire to be in better physical shape and how it  affects her emotional state.  Worked some with CBT diagram on thoughts/feelings/behavior and she took diagram home with her to use.  Interventions: Cognitive Behavioral Therapy and Ego-Supportive  Diagnosis:   ICD-10-CM   1. Bipolar I disorder with rapid cycling ([place]) F31.9     Plan:  Patient to follow through with more practice of CBT strategies to support the changes she hopes for in the coming weeks.  Seems more motivated by session end and asked to be seen earlier for next appt which was arranged.  To remain on meds as prescribed.    Shanon Ace, LCSW

## 2018-09-08 ENCOUNTER — Emergency Department (HOSPITAL_COMMUNITY)
Admission: EM | Admit: 2018-09-08 | Discharge: 2018-09-08 | Disposition: A | Discharge status: Home / Self Care | Payer: BLUE CROSS/BLUE SHIELD | Attending: Emergency Medicine | Admitting: Emergency Medicine

## 2018-09-08 ENCOUNTER — Emergency Department (HOSPITAL_COMMUNITY): Payer: BLUE CROSS/BLUE SHIELD

## 2018-09-08 DIAGNOSIS — S299XXA Unspecified injury of thorax, initial encounter: Secondary | ICD-10-CM | POA: Diagnosis not present

## 2018-09-08 DIAGNOSIS — R000 Tachycardia, unspecified: Secondary | ICD-10-CM | POA: Diagnosis not present

## 2018-09-08 DIAGNOSIS — W19XXXA Unspecified fall, initial encounter: Secondary | ICD-10-CM

## 2018-09-08 DIAGNOSIS — Z96643 Presence of artificial hip joint, bilateral: Secondary | ICD-10-CM | POA: Diagnosis not present

## 2018-09-08 DIAGNOSIS — S0083XA Contusion of other part of head, initial encounter: Secondary | ICD-10-CM

## 2018-09-08 DIAGNOSIS — W0110XA Fall on same level from slipping, tripping and stumbling with subsequent striking against unspecified object, initial encounter: Secondary | ICD-10-CM | POA: Insufficient documentation

## 2018-09-08 DIAGNOSIS — S0990XA Unspecified injury of head, initial encounter: Secondary | ICD-10-CM | POA: Diagnosis not present

## 2018-09-08 DIAGNOSIS — S01512A Laceration without foreign body of oral cavity, initial encounter: Secondary | ICD-10-CM

## 2018-09-08 DIAGNOSIS — Z471 Aftercare following joint replacement surgery: Secondary | ICD-10-CM | POA: Diagnosis not present

## 2018-09-08 DIAGNOSIS — Y92481 Parking lot as the place of occurrence of the external cause: Secondary | ICD-10-CM | POA: Diagnosis not present

## 2018-09-08 DIAGNOSIS — Z85118 Personal history of other malignant neoplasm of bronchus and lung: Secondary | ICD-10-CM | POA: Insufficient documentation

## 2018-09-08 DIAGNOSIS — R52 Pain, unspecified: Secondary | ICD-10-CM | POA: Diagnosis not present

## 2018-09-08 DIAGNOSIS — Y9301 Activity, walking, marching and hiking: Secondary | ICD-10-CM | POA: Insufficient documentation

## 2018-09-08 DIAGNOSIS — Z79899 Other long term (current) drug therapy: Secondary | ICD-10-CM | POA: Diagnosis not present

## 2018-09-08 DIAGNOSIS — S199XXA Unspecified injury of neck, initial encounter: Secondary | ICD-10-CM | POA: Diagnosis not present

## 2018-09-08 DIAGNOSIS — R42 Dizziness and giddiness: Secondary | ICD-10-CM | POA: Diagnosis not present

## 2018-09-08 DIAGNOSIS — R55 Syncope and collapse: Secondary | ICD-10-CM | POA: Diagnosis not present

## 2018-09-08 DIAGNOSIS — Y999 Unspecified external cause status: Secondary | ICD-10-CM | POA: Diagnosis not present

## 2018-09-08 DIAGNOSIS — R110 Nausea: Secondary | ICD-10-CM | POA: Diagnosis not present

## 2018-09-08 LAB — CBG MONITORING, ED: Glucose-Capillary: 98 mg/dL (ref 70–99)

## 2018-09-08 NOTE — ED Triage Notes (Signed)
Pt BIB EMS c/o mechanical fall with syncope.  Witnesses claim pt "tripped over speed bump going into store, hit front of face/chin, lost consciousness for ~10 sec".  Pt denies taking blood thinners. Also c/o left hip pain. Lip puncture with dressing present on arrival. Also c/o left rib pain.

## 2018-09-08 NOTE — ED Notes (Signed)
Bed: WA03 Expected date:  Expected time:  Means of arrival:  Comments: fall

## 2018-09-08 NOTE — Discharge Instructions (Signed)
Swish with water or salt water after eating.  Take tylenol for pain and expect to be really sore over the next 3-4 days

## 2018-09-08 NOTE — ED Provider Notes (Signed)
[facility] DEPT Provider Note   CSN: [id number] Arrival date & time: 09/08/18  1223     History   Chief Complaint Chief Complaint  Patient presents with  . Fall  . Loss of Consciousness    HPI Megan Whitney is a 63 y.o. female.  Patient is a 63 year old female with a history of anemia, hyperlipidemia, lung cancer and GERD who is presenting today after a fall.  Patient states that she was walking out of [employer] and she thought someone was following her so she was looking behind her and she did not see the parking block and she tripped over it falling forward hitting her chin on the ground.  She states there is a brief.  She does not remember.  Since the fall she has had pain to her chin and lower lip, mild dizziness, left-sided rib and hip pain.  She has been able to ambulate but it is tender in her hip.  She denies any shortness of breath, abdominal pain or vomiting.  She has no visual changes.  She does not take anticoagulation.  She states she always shuffles a little bit when she walks but she rarely falls.  The history is provided by the patient.  Fall  This is a new problem. The current episode started less than 1 hour ago. The problem occurs constantly. The problem has not changed since onset.Associated symptoms include chest pain and headaches. Pertinent negatives include no abdominal pain and no shortness of breath. The symptoms are aggravated by walking. The symptoms are relieved by rest. She has tried rest for the symptoms. The treatment provided no relief.  Loss of Consciousness   Associated symptoms include chest pain and headaches. Pertinent negatives include abdominal pain.    Past Medical History:  Diagnosis Date  . Anemia    History of GI blood loss  . Anxiety   . Arthritis   . Bipolar 1 disorder ([place])   . Colon polyps   . Depression   . Epistaxis    Around 2011 or 2012, required cauterization.   . Esophageal stricture   .  GERD (gastroesophageal reflux disease)   . Headache(784.0)   . Hyperlipidemia   . Interstitial cystitis   . Lung cancer ([place]) 2002  . Obesity   . Sleep apnea    Doesn't use a CPAP    Patient Active Problem List   Diagnosis Date Noted  . Stricture and stenosis of esophagus 05/16/2012  . Hiatal hernia 05/16/2012  . Dysphagia, unspecified(787.20) 05/15/2012  . Cancer ([place])   . Depression   . Bipolar 1 disorder ([place])   . Anemia   . ANEMIA, IRON DEFICIENCY 09/14/2010  . GERD 09/14/2010  . Personal history of colonic polyps 09/14/2010    Past Surgical History:  Procedure Laterality Date  . BALLOON DILATION  05/16/2012   Procedure: BALLOON DILATION;  Surgeon: Inda Castle, MD;  Location: [facility];  Service: Endoscopy;  Laterality: N/A;  . COLONOSCOPY    . ENTEROSCOPY  05/16/2012   Procedure: ENTEROSCOPY;  Surgeon: Inda Castle, MD;  Location: [facility];  Service: Endoscopy;  Laterality: N/A;  . JOINT REPLACEMENT    . TOTAL HIP ARTHROPLASTY     bilateral  . TUBAL LIGATION    . WEDGE RESECTION  2002   lung cancer     OB History   No obstetric history on file.      Home Medications    Prior to Admission medications  Medication Sig Start Date End Date Taking? Authorizing Provider  atorvastatin (LIPITOR) 10 MG tablet Take 10 mg by mouth daily.   Yes [provider]  Carbamazepine (EQUETRO) 300 MG CP12 Take 300 mg by mouth at bedtime. 12/22/17  Yes [provider]  Cariprazine HCl (VRAYLAR) 4.5 MG CAPS Take 1 capsule (4.5 mg total) by mouth daily. 07/26/18  Yes Cottle, Billey Co., MD  cetirizine (ZYRTEC) 10 MG tablet Take 10 mg by mouth daily.   Yes [provider]  Cholecalciferol 1.25 MG (50000 UT) capsule Take 1 capsule (50,000 Units total) by mouth 3 (three) times a week. 08/10/18  Yes Cottle, Billey Co., MD  dexmethylphenidate (FOCALIN XR) 20 MG 24 hr capsule Take 1 capsule (20 mg total) by mouth every morning. 07/26/18  Yes Cottle,  Billey Co., MD  dexmethylphenidate (FOCALIN) 10 MG tablet Take 1 tablet (10 mg total) by mouth every evening. 06/19/18  Yes Cottle, Billey Co., MD  famotidine (PEPCID) 20 MG tablet Take 20 mg by mouth daily.   Yes [provider]  ferrous gluconate (FERGON) 324 MG tablet Take 324 mg by mouth daily with breakfast.   Yes [provider]  FLUoxetine (PROZAC) 40 MG capsule Take 200 mg by mouth 3 (three) times daily.    Yes [provider]  lamoTRIgine (LAMICTAL) 200 MG tablet Take 200 mg by mouth 2 (two) times daily.    Yes [provider]  lithium carbonate 150 MG capsule Take 1 capsule (150 mg total) by mouth daily. 06/21/18  Yes Cottle, Billey Co., MD  Multiple Vitamin (MULTIVITAMIN WITH MINERALS) TABS Take 1 tablet by mouth daily.   Yes [provider]  pantoprazole (PROTONIX) 40 MG tablet Take 40 mg by mouth daily.   Yes [provider]  propranolol (INDERAL) 10 MG tablet TAKE 1 TO 3 TABLETS BY MOUTH DAILY AS NEEDED FOR TREMOR 08/26/18   Cottle, Billey Co., MD    Family History Family History  Problem Relation Age of Onset  . Arthritis Mother   . Hypertension Father   . Diabetes Mellitus II Father   . Heart disease Father   . Colon cancer Neg Hx   . Esophageal cancer Neg Hx   . Rectal cancer Neg Hx     Social History Social History   Tobacco Use  . Smoking status: Never Smoker  . Smokeless tobacco: Never Used  Substance Use Topics  . Alcohol use: Yes    Alcohol/week: 1.0 standard drinks    Types: 1 Glasses of wine per week    Comment: Moderate  . Drug use: No     Allergies   Azithromycin; Penicillins; and Adhesive [tape]   Review of Systems Review of Systems  Respiratory: Negative for shortness of breath.   Cardiovascular: Positive for chest pain and syncope.  Gastrointestinal: Negative for abdominal pain.  Neurological: Positive for headaches.  All other systems reviewed and are negative.    Physical  Exam Updated Vital Signs BP 112/72   Pulse 65   Temp 97.9 F (36.6 C) (Oral)   Resp 16   SpO2 96%   Physical Exam Vitals signs and nursing note reviewed.  Constitutional:      General: She is not in acute distress.    Appearance: She is well-developed.  HENT:     Head: Normocephalic and atraumatic.      Right Ear: Tympanic membrane normal.     Left Ear: Tympanic membrane normal.  Nose: Nose normal.     Mouth/Throat:     Dentition: Normal dentition.     Tongue: No lesions.   Eyes:     Conjunctiva/sclera: Conjunctivae normal.     Pupils: Pupils are equal, round, and reactive to light.  Neck:     Musculoskeletal: Normal range of motion and neck supple. No spinous process tenderness or muscular tenderness.  Cardiovascular:     Rate and Rhythm: Normal rate and regular rhythm.     Heart sounds: No murmur.  Pulmonary:     Effort: Pulmonary effort is normal. No respiratory distress.     Breath sounds: Normal breath sounds. No wheezing or rales.  Chest:     Chest wall: Tenderness present.    Abdominal:     General: There is no distension.     Palpations: Abdomen is soft.     Tenderness: There is no abdominal tenderness. There is no guarding or rebound.  Musculoskeletal: Normal range of motion.        General: Tenderness present.     Left hip: She exhibits tenderness and bony tenderness. She exhibits no deformity.       Legs:  Skin:    General: Skin is warm and dry.     Findings: No erythema or rash.  Neurological:     Mental Status: She is alert and oriented to person, place, and time.  Psychiatric:        Behavior: Behavior normal.      ED Treatments / Results  Labs (all labs ordered are listed, but only abnormal results are displayed) Labs Reviewed  CBG MONITORING, ED    EKG None  Radiology Dg Ribs Unilateral W/chest Left  Result Date: 09/08/2018 CLINICAL DATA:  Fall with syncope EXAM: LEFT RIBS AND CHEST - 3+ VIEW COMPARISON:  10/10/2012 FINDINGS:  Normal heart size. Lungs are under aerated with bibasilar atelectasis. Large hiatal hernia is unchanged. No pneumothorax. No definite acute rib fracture. Chronic left anterolateral fifth rib deformity. IMPRESSION: No acute cardiopulmonary disease. No evidence of acute rib fracture. Electronically Signed   By: Marybelle Killings M.D.   On: 09/08/2018 13:46   Ct Head Wo Contrast  Result Date: 09/08/2018 CLINICAL DATA:  Syncope with fall. Trauma to the head and face. EXAM: CT HEAD WITHOUT CONTRAST CT CERVICAL SPINE WITHOUT CONTRAST TECHNIQUE: Multidetector CT imaging of the head and cervical spine was performed following the standard protocol without intravenous contrast. Multiplanar CT image reconstructions of the cervical spine were also generated. COMPARISON:  12/01/2017 FINDINGS: CT HEAD FINDINGS Brain: The brain shows a normal appearance without evidence of malformation, atrophy, old or acute small or large vessel infarction, mass lesion, hemorrhage, hydrocephalus or extra-axial collection. Vascular: No hyperdense vessel. No evidence of atherosclerotic calcification. Skull: Normal. No traumatic finding. No focal bone lesion. Sinuses/Orbits: Sinuses are clear. Orbits appear normal. Mastoids are clear. Other: None significant CT CERVICAL SPINE FINDINGS Alignment: Increase kyphotic curvature. No traumatic malalignment. Skull base and vertebrae: No evidence of fracture. Chronic fusion at the C2-3 level. Soft tissues and spinal canal: Negative Disc levels: Chronic degenerative spondylosis at C3-4, C4-5, C5-6 and C6-7. Facet arthropathy at C3-4. No compressive central canal stenosis. Foraminal narrowing that could be symptomatic on the right at C3-4 and C4-5 and on the left at C3-4 and C5-6. Upper chest: Negative Other: None IMPRESSION: 1. Head CT: Normal. 2. Cervical spine CT: No acute or traumatic finding. Chronic degenerative changes as outlined above. Electronically Signed   By: Jan Fireman.D.  On: 09/08/2018 14:12    Ct Cervical Spine Wo Contrast  Result Date: 09/08/2018 CLINICAL DATA:  Syncope with fall. Trauma to the head and face. EXAM: CT HEAD WITHOUT CONTRAST CT CERVICAL SPINE WITHOUT CONTRAST TECHNIQUE: Multidetector CT imaging of the head and cervical spine was performed following the standard protocol without intravenous contrast. Multiplanar CT image reconstructions of the cervical spine were also generated. COMPARISON:  12/01/2017 FINDINGS: CT HEAD FINDINGS Brain: The brain shows a normal appearance without evidence of malformation, atrophy, old or acute small or large vessel infarction, mass lesion, hemorrhage, hydrocephalus or extra-axial collection. Vascular: No hyperdense vessel. No evidence of atherosclerotic calcification. Skull: Normal. No traumatic finding. No focal bone lesion. Sinuses/Orbits: Sinuses are clear. Orbits appear normal. Mastoids are clear. Other: None significant CT CERVICAL SPINE FINDINGS Alignment: Increase kyphotic curvature. No traumatic malalignment. Skull base and vertebrae: No evidence of fracture. Chronic fusion at the C2-3 level. Soft tissues and spinal canal: Negative Disc levels: Chronic degenerative spondylosis at C3-4, C4-5, C5-6 and C6-7. Facet arthropathy at C3-4. No compressive central canal stenosis. Foraminal narrowing that could be symptomatic on the right at C3-4 and C4-5 and on the left at C3-4 and C5-6. Upper chest: Negative Other: None IMPRESSION: 1. Head CT: Normal. 2. Cervical spine CT: No acute or traumatic finding. Chronic degenerative changes as outlined above. Electronically Signed   By: Nelson Chimes M.D.   On: 09/08/2018 14:12   Dg Hip Unilat With Pelvis 2-3 Views Left  Result Date: 09/08/2018 CLINICAL DATA:  Status post fall with syncope. EXAM: DG HIP (WITH OR WITHOUT PELVIS) 2-3V LEFT COMPARISON:  None FINDINGS: No acute fracture or dislocation identified. Previous bilateral hip arthroplasty. Left hip arthroplasty device is visualized and is entirety. No  left hip periprosthetic fracture period. IMPRESSION: 1. No acute findings. 2. Status post bilateral hip arthroplasty. Electronically Signed   By: Kerby Moors M.D.   On: 09/08/2018 13:45    Procedures Procedures (including critical care time)  Medications Ordered in ED Medications - No data to display   Initial Impression / Assessment and Plan / ED Course  I have reviewed the triage vital signs and the nursing notes.  Pertinent labs & imaging results that were available during my care of the patient were reviewed by me and considered in my medical decision making (see chart for details).    Patient with a fall today when she tripped over a parking meter.  She was unable to stop her fall and fell face first into the pavement.  Possible LOC but patient is awake and lucid now.  She does not take anticoagulation and her head and cervical films are negative.  She also has left-sided rib and hip pain but has been able to ambulate.  X-ray of the chest and hip show no acute process.  She was able to ambulate here as well.  Patient does have an abrasion over her chin and a 2 cm laceration on the inside of her lower lip but it is not through and through.  Discussed with her that the oral laceration should heal secondarily within the next few days and she will gargle with salt water or water after meals.  She will use Neosporin on the abrasion on her chin and use Tylenol as needed for pain.  Final Clinical Impressions(s) / ED Diagnoses   Final diagnoses:  Contusion of face, initial encounter  Fall, initial encounter  Laceration of oral cavity, initial encounter    ED Discharge Orders  None       Blanchie Dessert, MD 09/08/18 1517

## 2018-09-10 ENCOUNTER — Encounter: Payer: BLUE CROSS/BLUE SHIELD | Admitting: Internal Medicine

## 2018-09-11 DIAGNOSIS — Z23 Encounter for immunization: Secondary | ICD-10-CM | POA: Diagnosis not present

## 2018-09-14 ENCOUNTER — Telehealth: Payer: Self-pay | Admitting: Internal Medicine

## 2018-09-14 NOTE — Telephone Encounter (Signed)
Pt has upcoming endo-col 1.13.20.  Pt has questions about her procedure and would like a CB.

## 2018-09-17 ENCOUNTER — Ambulatory Visit (AMBULATORY_SURGERY_CENTER): Payer: BLUE CROSS/BLUE SHIELD | Admitting: Internal Medicine

## 2018-09-17 ENCOUNTER — Encounter: Payer: Self-pay | Admitting: Internal Medicine

## 2018-09-17 VITALS — BP 104/55 | HR 61 | Temp 98.6°F | Resp 20 | Ht 59.0 in | Wt 166.0 lb

## 2018-09-17 DIAGNOSIS — R1319 Other dysphagia: Secondary | ICD-10-CM

## 2018-09-17 DIAGNOSIS — Z86010 Personal history of colonic polyps: Secondary | ICD-10-CM

## 2018-09-17 DIAGNOSIS — R1031 Right lower quadrant pain: Secondary | ICD-10-CM

## 2018-09-17 DIAGNOSIS — K219 Gastro-esophageal reflux disease without esophagitis: Secondary | ICD-10-CM

## 2018-09-17 DIAGNOSIS — D125 Benign neoplasm of sigmoid colon: Secondary | ICD-10-CM

## 2018-09-17 DIAGNOSIS — R1310 Dysphagia, unspecified: Secondary | ICD-10-CM

## 2018-09-17 DIAGNOSIS — K317 Polyp of stomach and duodenum: Secondary | ICD-10-CM | POA: Diagnosis not present

## 2018-09-17 DIAGNOSIS — K222 Esophageal obstruction: Secondary | ICD-10-CM | POA: Diagnosis not present

## 2018-09-17 DIAGNOSIS — D124 Benign neoplasm of descending colon: Secondary | ICD-10-CM

## 2018-09-17 MED ORDER — SODIUM CHLORIDE 0.9 % IV SOLN: 500.0000 mL | Freq: Once | INTRAVENOUS | Status: DC

## 2018-09-17 NOTE — Patient Instructions (Signed)
Impression/Recommendations:  Hiatal hernia handout given to patient. Post-dilation diet handout given to patient. Polyp handout given to patient.  Repeat colonoscopy in 5 years for surveillance.  Continue present medications.  Await pathology results.  YOU HAD AN ENDOSCOPIC PROCEDURE TODAY AT [facility]:   Refer to the procedure report that was given to you for any specific questions about what was found during the examination.  If the procedure report does not answer your questions, please call your gastroenterologist to clarify.  If you requested that your care partner not be given the details of your procedure findings, then the procedure report has been included in a sealed envelope for you to review at your convenience later.  YOU SHOULD EXPECT: Some feelings of bloating in the abdomen. Passage of more gas than usual.  Walking can help get rid of the air that was put into your GI tract during the procedure and reduce the bloating. If you had a lower endoscopy (such as a colonoscopy or flexible sigmoidoscopy) you may notice spotting of blood in your stool or on the toilet paper. If you underwent a bowel prep for your procedure, you may not have a normal bowel movement for a few days.  Please Note:  You might notice some irritation and congestion in your nose or some drainage.  This is from the oxygen used during your procedure.  There is no need for concern and it should clear up in a day or so.  SYMPTOMS TO REPORT IMMEDIATELY:   Following lower endoscopy (colonoscopy or flexible sigmoidoscopy):  Excessive amounts of blood in the stool  Significant tenderness or worsening of abdominal pains  Swelling of the abdomen that is new, acute  Fever of 100F or higher   Following upper endoscopy (EGD)  Vomiting of blood or coffee ground material  New chest pain or pain under the shoulder blades  Painful or persistently difficult swallowing  New shortness of breath  Fever  of 100F or higher  Black, tarry-looking stools  For urgent or emergent issues, a gastroenterologist can be reached at any hour by calling [phone].   DIET:  We do recommend a small meal at first, but then you may proceed to your regular diet.  Drink plenty of fluids but you should avoid alcoholic beverages for 24 hours.  ACTIVITY:  You should plan to take it easy for the rest of today and you should NOT DRIVE or use heavy machinery until tomorrow (because of the sedation medicines used during the test).    FOLLOW UP: Our staff will call the number listed on your records the next business day following your procedure to check on you and address any questions or concerns that you may have regarding the information given to you following your procedure. If we do not reach you, we will leave a message.  However, if you are feeling well and you are not experiencing any problems, there is no need to return our call.  We will assume that you have returned to your regular daily activities without incident.  If any biopsies were taken you will be contacted by phone or by letter within the next 1-3 weeks.  Please call us at [phone] if you have not heard about the biopsies in 3 weeks.    SIGNATURES/CONFIDENTIALITY: You and/or your care partner have signed paperwork which will be entered into your electronic medical record.  These signatures attest to the fact that that the information above on your After  Visit Summary has been reviewed and is understood.  Full responsibility of the confidentiality of this discharge information lies with you and/or your care-partner. 

## 2018-09-17 NOTE — Op Note (Signed)
[facility] Patient Name: Megan Whitney Procedure Date: 09/17/2018 1:54 PM MRN: [id number] Endoscopist: Docia Chuck. Henrene Pastor , MD Age: 63 Referring MD:  Date of Birth: 09/07/1955 Gender: Female Account #: [id number] Procedure:                Upper GI endoscopy with balloon dilation of the                            esophagus & biopsies Indications:              Dysphagia, Esophageal reflux. NOTE: patient's                            previous problems with abdominal pain have resolved                            since her office visit Medicines:                Monitored Anesthesia Care Procedure:                Pre-Anesthesia Assessment:                           - Prior to the procedure, a History and Physical                            was performed, and patient medications and                            allergies were reviewed. The patient's tolerance of                            previous anesthesia was also reviewed. The risks                            and benefits of the procedure and the sedation                            options and risks were discussed with the patient.                            All questions were answered, and informed consent                            was obtained. Prior Anticoagulants: The patient has                            taken no previous anticoagulant or antiplatelet                            agents. ASA Grade Assessment: II - A patient with                            mild systemic disease. After reviewing the risks  and benefits, the patient was deemed in                            satisfactory condition to undergo the procedure.                           After obtaining informed consent, the endoscope was                            passed under direct vision. Throughout the                            procedure, the patient's blood pressure, pulse, and                            oxygen saturations were monitored  continuously. The                            Endoscope was introduced through the mouth, and                            advanced to the second part of duodenum. The upper                            GI endoscopy was accomplished without difficulty.                            The patient tolerated the procedure well. Scope In: Scope Out: Findings:                 One benign-appearing, intrinsic moderate stenosis                            was found 30 cm from the incisors. This stenosis                            measured 1.5 cm (inner diameter). The stenosis was                            traversed. A TTS dilator was passed through the                            scope. Dilation with an 18-19-20 mm balloon dilator                            was performed to 20 mm [Fluoro Guidance?].                           The stomach revealed a large hiatal hernia. As well                            benign small (all less than 1 cm) gastric polyps.  Biopsies were taken with a cold forceps for                            histology.                           The examined duodenum was normal.                           The cardia and gastric fundus were normal on                            retroflexion. Complications:            No immediate complications. Estimated Blood Loss:     Estimated blood loss: none. Impression:               1. GERD                           2. Esophageal stricture status post dilation                           3. Large hiatal hernia and benign gastric polyp                            status post biopsy. Recommendation:           - Patient has a contact number available for                            emergencies. The signs and symptoms of potential                            delayed complications were discussed with the                            patient. Return to normal activities tomorrow.                            Written discharge instructions were  provided to the                            patient.                           - Post dilation diet.                           - Continue present medications.                           - Await pathology results. Docia Chuck. Henrene Pastor, MD 09/17/2018 2:42:05 PM This report has been signed electronically.

## 2018-09-17 NOTE — Op Note (Signed)
[facility] Patient Name: Megan Whitney Procedure Date: 09/17/2018 1:55 PM MRN: [id number] Endoscopist: Docia Chuck. Henrene Pastor , MD Age: 63 Referring MD:  Date of Birth: 1956-02-21 Gender: Female Account #: [id number] Procedure:                Colonoscopy snare polypectomy x 2 Indications:              High risk colon cancer surveillance: Personal                            history of adenomatous colon polyps. Previous                            examinations 2002, 2009, 2012 Medicines:                Monitored Anesthesia Care Procedure:                Pre-Anesthesia Assessment:                           - Prior to the procedure, a History and Physical                            was performed, and patient medications and                            allergies were reviewed. The patient's tolerance of                            previous anesthesia was also reviewed. The risks                            and benefits of the procedure and the sedation                            options and risks were discussed with the patient.                            All questions were answered, and informed consent                            was obtained. Prior Anticoagulants: The patient has                            taken no previous anticoagulant or antiplatelet                            agents. ASA Grade Assessment: II - A patient with                            mild systemic disease. After reviewing the risks                            and benefits, the patient was deemed in  satisfactory condition to undergo the procedure.                           After obtaining informed consent, the colonoscope                            was passed under direct vision. Throughout the                            procedure, the patient's blood pressure, pulse, and                            oxygen saturations were monitored continuously. The                            Colonoscope was  introduced through the anus and                            advanced to the the cecum, identified by                            appendiceal orifice and ileocecal valve. The                            ileocecal valve, appendiceal orifice, and rectum                            were photographed. The quality of the bowel                            preparation was good. The colonoscopy was performed                            without difficulty. The patient tolerated the                            procedure well. The bowel preparation used was                            SUPREP. Scope In: 2:04:26 PM Scope Out: 2:24:41 PM Scope Withdrawal Time: 0 hours 11 minutes 11 seconds  Total Procedure Duration: 0 hours 20 minutes 15 seconds  Findings:                 Two polyps were found in the sigmoid colon and                            descending colon. The polyps were 4 to 5 mm in                            size. These polyps were removed with a cold snare.                            Resection and retrieval were complete.  The exam was otherwise without abnormality on                            direct and retroflexion views. Complications:            No immediate complications. Estimated blood loss:                            None. Estimated Blood Loss:     Estimated blood loss: none. Impression:               - Two 4 to 5 mm polyps in the sigmoid colon and in                            the descending colon, removed with a cold snare.                            Resected and retrieved.                           - The examination was otherwise normal on direct                            and retroflexion views. Recommendation:           - Repeat colonoscopy in 5 years for surveillance.                           - Patient has a contact number available for                            emergencies. The signs and symptoms of potential                            delayed complications  were discussed with the                            patient. Return to normal activities tomorrow.                            Written discharge instructions were provided to the                            patient.                           - Resume previous diet.                           - Continue present medications.                           - Await pathology results. Docia Chuck. Henrene Pastor, MD 09/17/2018 2:28:16 PM This report has been signed electronically.

## 2018-09-17 NOTE — Progress Notes (Signed)
Called to room to assist during endoscopic procedure.  Patient ID and intended procedure confirmed with present staff. Received instructions for my participation in the procedure from the performing physician.  

## 2018-09-17 NOTE — Progress Notes (Signed)
PT taken to PACU. Monitors in place. VSS. Report given to RN. 

## 2018-09-18 ENCOUNTER — Telehealth: Payer: Self-pay | Admitting: *Deleted

## 2018-09-18 NOTE — Telephone Encounter (Signed)
  Follow up Call-  Call back number 09/17/2018  Post procedure Call Back phone  # [phone]  Permission to leave phone message Yes  Some recent data might be hidden     Patient questions:  Do you have a fever, pain , or abdominal swelling? No. Pain Score  0 *  Have you tolerated food without any problems? Yes.    Have you been able to return to your normal activities? Yes.    Do you have any questions about your discharge instructions: Diet   No. Medications  No. Follow up visit  No.  Do you have questions or concerns about your Care? No.  Actions: * If pain score is 4 or above: No action needed, pain <4.

## 2018-09-21 ENCOUNTER — Encounter: Payer: Self-pay | Admitting: Internal Medicine

## 2018-09-24 ENCOUNTER — Ambulatory Visit: Payer: BLUE CROSS/BLUE SHIELD | Admitting: Psychiatry

## 2018-09-24 ENCOUNTER — Encounter: Payer: Self-pay | Admitting: Psychiatry

## 2018-09-24 VITALS — BP 129/77 | HR 66

## 2018-09-24 DIAGNOSIS — F900 Attention-deficit hyperactivity disorder, predominantly inattentive type: Secondary | ICD-10-CM

## 2018-09-24 DIAGNOSIS — G3184 Mild cognitive impairment, so stated: Secondary | ICD-10-CM

## 2018-09-24 DIAGNOSIS — F319 Bipolar disorder, unspecified: Secondary | ICD-10-CM | POA: Diagnosis not present

## 2018-09-24 DIAGNOSIS — F411 Generalized anxiety disorder: Secondary | ICD-10-CM | POA: Diagnosis not present

## 2018-09-24 DIAGNOSIS — G251 Drug-induced tremor: Secondary | ICD-10-CM

## 2018-09-24 DIAGNOSIS — R7989 Other specified abnormal findings of blood chemistry: Secondary | ICD-10-CM

## 2018-09-24 MED ORDER — DEXMETHYLPHENIDATE HCL ER 20 MG PO CP24: 20.0000 mg | ORAL_CAPSULE | Freq: Every day | ORAL | 0 refills | Status: DC

## 2018-09-24 MED ORDER — DEXMETHYLPHENIDATE HCL 10 MG PO TABS: 10.0000 mg | ORAL_TABLET | Freq: Every evening | ORAL | 0 refills | Status: DC

## 2018-09-24 MED ORDER — DEXMETHYLPHENIDATE HCL ER 20 MG PO CP24: 20.0000 mg | ORAL_CAPSULE | ORAL | 0 refills | Status: DC

## 2018-09-24 NOTE — Patient Instructions (Signed)
If the irritability is unmanageable call and we'll decrease the fluoxetine.

## 2018-09-24 NOTE — Progress Notes (Signed)
      [facility] Counselor/Therapist Progress Note  Patient ID: Megan Whitney, MRN: [id number],    Date: 09/24/2018  Time Spent: 58 minutes  Treatment Type: Individual Therapy  Reported Symptoms:  Anger, anxiety, some mild depression,   Mental Status Exam:  Appearance:   Casual     Behavior:  Appropriate and Sharing  Motor:  Normal  Speech/Language:   Normal Rate  Affect:  Congruent  Mood:  angry, anxious and depressed  Thought process:  normal  Thought content:    WNL  Sensory/Perceptual disturbances:    WNL  Orientation:  oriented to person, place, time/date, situation, day of week, month of year and year  Attention:  Good  Concentration:  Good  Memory:  WNL  Fund of knowledge:   Good  Insight:    Fair  Judgment:   Good  Impulse Control:  Good   Risk Assessment: Danger to Self:  No Self-injurious Behavior: No Danger to Others: No Duty to Warn:no Physical Aggression / Violence:No  Access to Firearms a concern: No  Gang Involvement:No   Subjective:  Patient in today reporting the above named symptoms.  Feeling more short-tempered and on edge especially with husband and his alcohol consumption.  Young Geographical information systems officer and her parents visiting this past weekend and husband got very intoxicated.  Patient concerned that parents of grand-daughter may not keep allowing her to be around them if husband continues to drink excessively.  Patient figuring out how she can still maintain contact with grand-daughter and family even if they set limits with patient's husband.    Had another incident where she tripped up in parking lot of shopping area 3 weekends ago.  Hit pavement and bruised chin area, and also bit her lip.  Was treated and released at ED at [facility] and has been ok, although rib they checked is still sore.  To see her PCP within couple wks for follow up.    Referring to "being on edge" noted above, patient states that she knows what to do that will help including:   Stay on my meds as prescribed, stay involved with my hobbies such knitting, practice deep breathing exercises, daily practicing CBT strategies discussed in recent sessions and again today, and calming self by interacting with my pet dog.  Also to try to refrain from comfort eating and choosing to walk or do other exercise some each day.  Gave patient an updated copy of CBT diagram of strategies today. Adds that getting up a bit earlier to read devotionals will also help her.    Interventions: Cognitive Behavioral Therapy and Ego-Supportive  Diagnosis:   ICD-10-CM   1. Bipolar I disorder with rapid cycling ([place]) F31.9     Plan:   Patient to follow through with strategies mentioned above.  To return 2-3 wks for continued goal-directed treatment.  Shanon Ace, LCSW

## 2018-09-24 NOTE — Progress Notes (Signed)
Megan Whitney [id number] 1955/11/03 63 y.o.  Subjective:   Patient ID:  Megan Whitney is a 63 y.o. (DOB 1956-01-05) female.  Chief Complaint:  Chief Complaint  Patient presents with  . Follow-up    Medication Management  . irritability  . Sleeping Problem    HPI last seen July 26, 2018 Megan Whitney presents to the office today for follow-up of recent mood swings   After the last visit serum vitamin D level was determined to be low at 33.  The goal and chronically depressed patient's is in the 50s if possible.  So her vitamin D was increased on December 4 or thereabouts.  This past week has had irritability and rage internally but using CBT per therapist to try and keep it under control.  Some triggers and a lot of stressors in 3 weeks and not enough sleep.  Going to bed on time but dog gets her up to go out and hard to get back to sleep.  Avg 7-8 hours.  No hyperactivity.  ? Impulsive except anger.  has noticed more tremor in her hands and worse in the morning. Tremor is noticeable and embarrassing but doesn't want to stop the Vraylar DT the benefit for her mood. Tremor better with the propranolol.  Seasonally the worst time of year for her and she doesn't want to change.  She is having more cognitive problems which she associates with this time of year as well.  Review of Systems:  Review of Systems  Neurological: Positive for tremors. Negative for weakness.  Psychiatric/Behavioral: Negative for agitation, behavioral problems, confusion, decreased concentration, dysphoric mood, hallucinations, self-injury, sleep disturbance and suicidal ideas. The patient is nervous/anxious. The patient is not hyperactive.     Medications: I have reviewed the patient's current medications.  Current Outpatient Medications  Medication Sig Dispense Refill  . atorvastatin (LIPITOR) 10 MG tablet Take 10 mg by mouth daily.    . Carbamazepine (EQUETRO) 300 MG CP12 Take 300 mg by mouth at  bedtime.    . Cariprazine HCl (VRAYLAR) 4.5 MG CAPS Take 1 capsule (4.5 mg total) by mouth daily. 30 capsule 2  . cetirizine (ZYRTEC) 10 MG tablet Take 10 mg by mouth daily.    . Cholecalciferol 1.25 MG (50000 UT) capsule Take 1 capsule (50,000 Units total) by mouth 3 (three) times a week. 12 capsule 5  . dexmethylphenidate (FOCALIN XR) 20 MG 24 hr capsule Take 1 capsule (20 mg total) by mouth every morning. 30 capsule 0  . dexmethylphenidate (FOCALIN) 10 MG tablet Take 1 tablet (10 mg total) by mouth every evening. 30 tablet 0  . famotidine (PEPCID) 20 MG tablet Take 20 mg by mouth daily.    . ferrous gluconate (FERGON) 324 MG tablet Take 324 mg by mouth daily with breakfast.    . FLUoxetine (PROZAC) 40 MG capsule Take 200 mg by mouth 3 (three) times daily.     Marland Kitchen lamoTRIgine (LAMICTAL) 200 MG tablet Take 200 mg by mouth 2 (two) times daily.     Marland Kitchen lithium carbonate 150 MG capsule Take 1 capsule (150 mg total) by mouth daily. 90 capsule 1  . pantoprazole (PROTONIX) 40 MG tablet Take 40 mg by mouth daily.    . propranolol (INDERAL) 10 MG tablet TAKE 1 TO 3 TABLETS BY MOUTH DAILY AS NEEDED FOR TREMOR 90 tablet 0  . Multiple Vitamin (MULTIVITAMIN WITH MINERALS) TABS Take 1 tablet by mouth daily.     No current facility-administered medications  for this visit.     Medication Side Effects: Other: tremor and weight gain.  Allergies:  Allergies  Allergen Reactions  . Azithromycin Anaphylaxis  . Penicillins Anaphylaxis    DID THE REACTION INVOLVE: Swelling of the face/tongue/throat, SOB, or low BP? Yes Sudden or severe rash/hives, skin peeling, or the inside of the mouth or nose? Yes Did it require medical treatment? No When did it last happen? If all above answers are "NO", may proceed with cephalosporin use.  . Adhesive [Tape]     On bandaids    Past Medical History:  Diagnosis Date  . Anemia    History of GI blood loss  . Anxiety   . Arthritis   . Bipolar 1 disorder ([place])   .  Colon polyps   . Depression   . Epistaxis    Around 2011 or 2012, required cauterization.   . Esophageal stricture   . GERD (gastroesophageal reflux disease)   . Headache(784.0)   . Hyperlipidemia   . Interstitial cystitis   . Lung cancer ([place]) 2002  . Obesity   . Sleep apnea    Doesn't use a CPAP    Family History  Problem Relation Age of Onset  . Arthritis Mother   . Hypertension Father   . Diabetes Mellitus II Father   . Heart disease Father   . Colon cancer Neg Hx   . Esophageal cancer Neg Hx   . Rectal cancer Neg Hx     Social History   Socioeconomic History  . Marital status: Married    Spouse name: Not on file  . Number of children: 1  . Years of education: Not on file  . Highest education level: Not on file  Occupational History  . Occupation: Admin. assistant  Social Needs  . Financial resource strain: Not on file  . Food insecurity:    Worry: Not on file    Inability: Not on file  . Transportation needs:    Medical: Not on file    Non-medical: Not on file  Tobacco Use  . Smoking status: Never Smoker  . Smokeless tobacco: Never Used  Substance and Sexual Activity  . Alcohol use: Yes    Alcohol/week: 1.0 standard drinks    Types: 1 Glasses of wine per week    Comment: Moderate  . Drug use: No  . Sexual activity: Not on file  Lifestyle  . Physical activity:    Days per week: Not on file    Minutes per session: Not on file  . Stress: Not on file  Relationships  . Social connections:    Talks on phone: Not on file    Gets together: Not on file    Attends religious service: Not on file    Active member of club or organization: Not on file    Attends meetings of clubs or organizations: Not on file    Relationship status: Not on file  . Intimate partner violence:    Fear of current or ex partner: Not on file    Emotionally abused: Not on file    Physically abused: Not on file    Forced sexual activity: Not on file  Other Topics Concern  . Not  on file  Social History Narrative  . Not on file    Past Medical History, Surgical history, Social history, and Family history were reviewed and updated as appropriate.   Please see review of systems for further details on the patient's review from today.  Objective:   Physical Exam:  BP 129/77 (BP Location: Left Arm)   Pulse 66   Physical Exam Constitutional:      General: She is not in acute distress.    Appearance: She is well-developed.  Musculoskeletal:        General: No deformity.  Neurological:     Mental Status: She is alert and oriented to person, place, and time.     Motor: Tremor present.     Coordination: Coordination normal.     Gait: Gait normal.  Psychiatric:        Attention and Perception: Attention and perception normal. She is attentive.        Mood and Affect: Mood is not anxious or depressed. Affect is not labile, blunt, angry or inappropriate.        Speech: Speech normal.        Behavior: Behavior normal.        Thought Content: Thought content normal. Thought content is not paranoid. Thought content does not include homicidal or suicidal ideation. Thought content does not include homicidal or suicidal plan.        Cognition and Memory: Cognition normal.        Judgment: Judgment normal.     Comments: Insight intact. No auditory or visual hallucinations. Mood more irritable. No SI since here.     Lab Review:     Component Value Date/Time   NA 136 05/15/2012 1020   K 3.5 05/15/2012 1020   CL 103 05/15/2012 1020   CO2 25 05/15/2012 1020   GLUCOSE 137 (H) 05/15/2012 1020   BUN 12 05/15/2012 1020   CREATININE 0.77 05/15/2012 1020   CALCIUM 8.7 05/15/2012 1020   PROT 6.5 05/14/2012 1404   ALBUMIN 3.4 (L) 05/14/2012 1404   AST 21 05/14/2012 1404   ALT 19 05/14/2012 1404   ALKPHOS 67 05/14/2012 1404   BILITOT 0.3 05/14/2012 1404   GFRNONAA >90 05/15/2012 1020   GFRAA >90 05/15/2012 1020       Component Value Date/Time   WBC 11.0 (H)  05/16/2012 0605   RBC 3.19 (L) 05/16/2012 0605   HGB 8.5 (L) 05/17/2012 0854   HCT 28.4 (L) 05/17/2012 0854   PLT 289 05/16/2012 0605   MCV 72.7 (L) 05/16/2012 0605   MCH 21.3 (L) 05/16/2012 0605   MCHC 29.3 (L) 05/16/2012 0605   RDW 22.1 (H) 05/16/2012 0605   LYMPHSABS 1.8 05/14/2012 1404   MONOABS 1.1 (H) 05/14/2012 1404   EOSABS 0.1 05/14/2012 1404   BASOSABS 0.0 05/14/2012 1404  Vitamin D level 33 on 10K units daily on 12/4/`9 Increased to prescription vitamin d 50K units Monday, Wed, Friday.  Rx sent in.   No results found for: POCLITH, LITHIUM   No results found for: PHENYTOIN, PHENOBARB, VALPROATE, CBMZ   .res Assessment: Plan:    Bipolar I disorder with rapid cycling ([place])  Generalized anxiety disorder  Attention deficit hyperactivity disorder (ADHD), predominantly inattentive type  Mild cognitive impairment  Tremor due to multiple drugs  Low vitamin D level  Netha has chronic rapid cycling bipolar disorder which is chronically unstable and has been difficult to control.  In September her fluoxetine was increased to 60 mg a day due to tearfulness.  She has however had more rapid cycling.  The increase in fluoxetine did help with the tearfulness but may have contributed to the rapid cycling.  We need to consider reducing the fluoxetine after the holidays.  Her rapid cycling is somewhat  improved with a higher dose of Vraylar but she has developed a tremor.  Propranolol managed the tremor.  Since last visit more irritability and fluoxetine could contribute.  Disc S/S of mania.  Check vitamin D level again before next visit to see if it has gotten to the 50's as it's a risk factor for seasonal depression and cognitive problems and pt is at risk of malabsorption.  Discussed potential metabolic side effects associated with atypical antipsychotics, as well as potential risk for movement side effects. Advised pt to contact office if movement side effects occur.  Ordered 3  months of refills of Focalin and Focalin XR  Disc SE meds and this is heightened by the complication of necessary polypharmacy.  This appointment was 35 minutes.  Follow-up 8 weeks and consider reducing the fluoxetine due to the rapid cycling.  BC of seasonal pattern we will try to defer.  So far less seasonal depression than usual.  It is possible that the Vraylar may be sufficient to control the depression.  She understands the instructions.  In particular we do not want irritability to cause work problems.  FU 8 weeks.  Lynder Parents MD, DFAPA  Please see After Visit Summary for patient specific instructions.  Future Appointments  Date Time Provider [place]  10/12/2018  8:00 AM Shanon Ace, LCSW [facility] None  10/26/2018  8:00 AM Shanon Ace, LCSW [facility] None  11/14/2018  8:00 AM Shanon Ace, LCSW [facility] None    No orders of the defined types were placed in this encounter.     -------------------------------

## 2018-10-02 DIAGNOSIS — E785 Hyperlipidemia, unspecified: Secondary | ICD-10-CM | POA: Diagnosis not present

## 2018-10-02 DIAGNOSIS — Z6832 Body mass index (BMI) 32.0-32.9, adult: Secondary | ICD-10-CM | POA: Diagnosis not present

## 2018-10-03 ENCOUNTER — Other Ambulatory Visit: Payer: Self-pay

## 2018-10-03 MED ORDER — FLUOXETINE HCL 20 MG PO CAPS: 60.0000 mg | ORAL_CAPSULE | Freq: Every day | ORAL | 1 refills | Status: DC

## 2018-10-08 DIAGNOSIS — K219 Gastro-esophageal reflux disease without esophagitis: Secondary | ICD-10-CM | POA: Diagnosis not present

## 2018-10-08 DIAGNOSIS — Z23 Encounter for immunization: Secondary | ICD-10-CM | POA: Diagnosis not present

## 2018-10-08 DIAGNOSIS — E782 Mixed hyperlipidemia: Secondary | ICD-10-CM | POA: Diagnosis not present

## 2018-10-08 DIAGNOSIS — K635 Polyp of colon: Secondary | ICD-10-CM | POA: Diagnosis not present

## 2018-10-10 ENCOUNTER — Other Ambulatory Visit: Payer: Self-pay

## 2018-10-10 MED ORDER — LAMOTRIGINE 200 MG PO TABS: 200.0000 mg | ORAL_TABLET | Freq: Two times a day (BID) | ORAL | 2 refills | Status: DC

## 2018-10-12 ENCOUNTER — Ambulatory Visit: Payer: BLUE CROSS/BLUE SHIELD | Admitting: Psychiatry

## 2018-10-21 ENCOUNTER — Other Ambulatory Visit: Payer: Self-pay

## 2018-10-21 ENCOUNTER — Encounter (HOSPITAL_COMMUNITY): Payer: Self-pay | Admitting: Emergency Medicine

## 2018-10-21 ENCOUNTER — Emergency Department (HOSPITAL_COMMUNITY)
Admission: EM | Admit: 2018-10-21 | Discharge: 2018-10-21 | Disposition: A | Discharge status: Home / Self Care | Payer: BLUE CROSS/BLUE SHIELD | Attending: Emergency Medicine | Admitting: Emergency Medicine

## 2018-10-21 ENCOUNTER — Emergency Department (HOSPITAL_COMMUNITY): Payer: BLUE CROSS/BLUE SHIELD

## 2018-10-21 DIAGNOSIS — S199XXA Unspecified injury of neck, initial encounter: Secondary | ICD-10-CM | POA: Diagnosis not present

## 2018-10-21 DIAGNOSIS — Y9301 Activity, walking, marching and hiking: Secondary | ICD-10-CM | POA: Insufficient documentation

## 2018-10-21 DIAGNOSIS — M25511 Pain in right shoulder: Secondary | ICD-10-CM | POA: Insufficient documentation

## 2018-10-21 DIAGNOSIS — Z85118 Personal history of other malignant neoplasm of bronchus and lung: Secondary | ICD-10-CM | POA: Diagnosis not present

## 2018-10-21 DIAGNOSIS — Z96642 Presence of left artificial hip joint: Secondary | ICD-10-CM | POA: Diagnosis not present

## 2018-10-21 DIAGNOSIS — S0990XA Unspecified injury of head, initial encounter: Secondary | ICD-10-CM

## 2018-10-21 DIAGNOSIS — Y929 Unspecified place or not applicable: Secondary | ICD-10-CM | POA: Diagnosis not present

## 2018-10-21 DIAGNOSIS — S161XXA Strain of muscle, fascia and tendon at neck level, initial encounter: Secondary | ICD-10-CM | POA: Diagnosis not present

## 2018-10-21 DIAGNOSIS — S4991XA Unspecified injury of right shoulder and upper arm, initial encounter: Secondary | ICD-10-CM | POA: Diagnosis not present

## 2018-10-21 DIAGNOSIS — W010XXA Fall on same level from slipping, tripping and stumbling without subsequent striking against object, initial encounter: Secondary | ICD-10-CM | POA: Insufficient documentation

## 2018-10-21 DIAGNOSIS — Y998 Other external cause status: Secondary | ICD-10-CM | POA: Diagnosis not present

## 2018-10-21 DIAGNOSIS — R9431 Abnormal electrocardiogram [ECG] [EKG]: Secondary | ICD-10-CM | POA: Diagnosis not present

## 2018-10-21 DIAGNOSIS — Z96641 Presence of right artificial hip joint: Secondary | ICD-10-CM | POA: Diagnosis not present

## 2018-10-21 DIAGNOSIS — M25551 Pain in right hip: Secondary | ICD-10-CM | POA: Diagnosis not present

## 2018-10-21 DIAGNOSIS — W19XXXA Unspecified fall, initial encounter: Secondary | ICD-10-CM

## 2018-10-21 DIAGNOSIS — S79911A Unspecified injury of right hip, initial encounter: Secondary | ICD-10-CM | POA: Diagnosis not present

## 2018-10-21 LAB — URINALYSIS, ROUTINE W REFLEX MICROSCOPIC
Bilirubin Urine: NEGATIVE
Glucose, UA: NEGATIVE mg/dL
Hgb urine dipstick: NEGATIVE
Ketones, ur: NEGATIVE mg/dL
Leukocytes,Ua: NEGATIVE
Nitrite: NEGATIVE
Protein, ur: NEGATIVE mg/dL
SPECIFIC GRAVITY, URINE: 1.02 (ref 1.005–1.030)
pH: 7 (ref 5.0–8.0)

## 2018-10-21 LAB — COMPREHENSIVE METABOLIC PANEL
ALT: 16 U/L (ref 0–44)
AST: 22 U/L (ref 15–41)
Albumin: 3.6 g/dL (ref 3.5–5.0)
Alkaline Phosphatase: 58 U/L (ref 38–126)
Anion gap: 6 (ref 5–15)
BUN: 22 mg/dL (ref 8–23)
CHLORIDE: 106 mmol/L (ref 98–111)
CO2: 24 mmol/L (ref 22–32)
Calcium: 8.8 mg/dL — ABNORMAL LOW (ref 8.9–10.3)
Creatinine, Ser: 0.75 mg/dL (ref 0.44–1.00)
GFR calc Af Amer: >60 mL/min (ref >60)
Glucose, Bld: 103 mg/dL — ABNORMAL HIGH (ref 70–99)
Potassium: 4 mmol/L (ref 3.5–5.1)
Sodium: 136 mmol/L (ref 135–145)
Total Bilirubin: 0.5 mg/dL (ref 0.3–1.2)
Total Protein: 6.6 g/dL (ref 6.5–8.1)

## 2018-10-21 LAB — CBC WITH DIFFERENTIAL/PLATELET
Abs Immature Granulocytes: 0.05 10*3/uL (ref 0.00–0.07)
Basophils Absolute: 0 10*3/uL (ref 0.0–0.1)
Basophils Relative: 0 %
EOS PCT: 9 %
Eosinophils Absolute: 0.7 10*3/uL — ABNORMAL HIGH (ref 0.0–0.5)
HCT: 38.7 % (ref 36.0–46.0)
HEMOGLOBIN: 12.2 g/dL (ref 12.0–15.0)
Immature Granulocytes: 1 %
LYMPHS PCT: 23 %
Lymphs Abs: 1.9 10*3/uL (ref 0.7–4.0)
MCH: 29.2 pg (ref 26.0–34.0)
MCHC: 31.5 g/dL (ref 30.0–36.0)
MCV: 92.6 fL (ref 80.0–100.0)
Monocytes Absolute: 0.9 10*3/uL (ref 0.1–1.0)
Monocytes Relative: 10 %
Neutro Abs: 4.6 10*3/uL (ref 1.7–7.7)
Neutrophils Relative %: 57 %
Platelets: 211 10*3/uL (ref 150–400)
RBC: 4.18 MIL/uL (ref 3.87–5.11)
RDW: 13.9 % (ref 11.5–15.5)
WBC: 8.2 10*3/uL (ref 4.0–10.5)
nRBC: 0 % (ref 0.0–0.2)

## 2018-10-21 LAB — TROPONIN I: Troponin I: <0.03 ng/mL (ref <0.03)

## 2018-10-21 LAB — LITHIUM LEVEL: Lithium Lvl: 0.18 mmol/L — ABNORMAL LOW (ref 0.60–1.20)

## 2018-10-21 MED ORDER — OXYCODONE-ACETAMINOPHEN 5-325 MG PO TABS
1.0000 | ORAL_TABLET | Freq: Once | ORAL | Status: AC
  Administered 2018-10-21: 1 via ORAL
  Filled 2018-10-21: qty 1

## 2018-10-21 MED ORDER — SENNOSIDES-DOCUSATE SODIUM 8.6-50 MG PO TABS: 1.0000 | ORAL_TABLET | Freq: Every evening | ORAL | 0 refills | Status: DC | PRN

## 2018-10-21 MED ORDER — OXYCODONE-ACETAMINOPHEN 5-325 MG PO TABS: 1.0000 | ORAL_TABLET | Freq: Four times a day (QID) | ORAL | 0 refills | Status: DC | PRN

## 2018-10-21 NOTE — Discharge Instructions (Signed)

## 2018-10-21 NOTE — ED Provider Notes (Signed)
Emergency Department Provider Note   I have reviewed the triage vital signs and the nursing notes.   HISTORY  Chief Complaint Fall   HPI Megan Whitney is a 63 y.o. female with PMH of anemia, Bipolar Disorder, GERD, HLD, and elevated BMI presents to the emergency department for evaluation after fall yesterday.  Patient states she is had several falls recently where she has episodes of getting off balance and feeling "out of control."  She denies constant or persistent symptoms.  She walks with the assistance of a cane at times.  No vertigo type symptoms.  Yesterday afternoon the patient had an episode where she lost her balance.  She tripped over the lip of the wheelchair ramp at home and fell hard landing on her right side.  There was no loss of consciousness.  The patient did sustain some head trauma.  She required assistance to get to her feet and had pain mostly in the right hip with attempted ambulation.  No numbness or weakness.  Patient has had headache, right neck pain, right shoulder pain, right hip pain since the fall yesterday.  She is typically ambulatory and requires minimal assistance.  She works as a Network engineer. Denies mid or lower back pain.    Past Medical History:  Diagnosis Date  . Anemia    History of GI blood loss  . Anxiety   . Arthritis   . Bipolar 1 disorder ([place])   . Colon polyps   . Depression   . Epistaxis    Around 2011 or 2012, required cauterization.   . Esophageal stricture   . GERD (gastroesophageal reflux disease)   . Headache(784.0)   . Hyperlipidemia   . Interstitial cystitis   . Lung cancer ([place]) 2002  . Obesity   . Sleep apnea    Doesn't use a CPAP    Patient Active Problem List   Diagnosis Date Noted  . Stricture and stenosis of esophagus 05/16/2012  . Hiatal hernia 05/16/2012  . Dysphagia, unspecified(787.20) 05/15/2012  . Cancer ([place])   . Depression   . Bipolar 1 disorder ([place])   . Anemia   . ANEMIA, IRON DEFICIENCY  09/14/2010  . GERD 09/14/2010  . Personal history of colonic polyps 09/14/2010    Past Surgical History:  Procedure Laterality Date  . BALLOON DILATION  05/16/2012   Procedure: BALLOON DILATION;  Surgeon: Inda Castle, MD;  Location: [facility];  Service: Endoscopy;  Laterality: N/A;  . COLONOSCOPY    . ENTEROSCOPY  05/16/2012   Procedure: ENTEROSCOPY;  Surgeon: Inda Castle, MD;  Location: [facility];  Service: Endoscopy;  Laterality: N/A;  . JOINT REPLACEMENT    . right shoulder durgery 25 yrs ago    . TOTAL HIP ARTHROPLASTY     bilateral  . TUBAL LIGATION    . WEDGE RESECTION  2002   lung cancer    Allergies Azithromycin; Penicillins; and Adhesive [tape]  Family History  Problem Relation Age of Onset  . Arthritis Mother   . Hypertension Father   . Diabetes Mellitus II Father   . Heart disease Father   . Colon cancer Neg Hx   . Esophageal cancer Neg Hx   . Rectal cancer Neg Hx     Social History Social History   Tobacco Use  . Smoking status: Never Smoker  . Smokeless tobacco: Never Used  Substance Use Topics  . Alcohol use: Yes    Alcohol/week: 1.0 standard drinks    Types:  1 Glasses of wine per week    Comment: Moderate  . Drug use: No    Review of Systems  Constitutional: No fever/chills Eyes: No visual changes. ENT: No sore throat. Cardiovascular: Denies chest pain. Respiratory: Denies shortness of breath. Gastrointestinal: No abdominal pain. No nausea, no vomiting. No diarrhea. No constipation. Genitourinary: Negative for dysuria. Musculoskeletal: Negative for back pain. Positive right hip and shoulder pain. Positive right neck pain.  Skin: Negative for rash. Neurological: Negative for focal weakness or numbness. Positive HA.   10-point ROS otherwise negative.  ____________________________________________   PHYSICAL EXAM:  VITAL SIGNS: Vitals:   10/21/18 0900 10/21/18 1100  BP: 114/71 (!) 107/56  Pulse: 65 60  Resp: 18 17    SpO2: 95% 95%    Constitutional: Alert and oriented. Well appearing and in no acute distress. Eyes: Conjunctivae are normal.  Head: Atraumatic. Nose: No congestion/rhinnorhea. Mouth/Throat: Mucous membranes are moist.  Oropharynx non-erythematous. Neck: No stridor. No cervical spine tenderness to palpation but some tenderness to palpation over the right lateral neck.  Cardiovascular: Normal rate, regular rhythm. Good peripheral circulation. Grossly normal heart sounds.   Respiratory: Normal respiratory effort.  No retractions. Lungs CTAB. Gastrointestinal: Soft and nontender. No distention.  Musculoskeletal: No lower extremity tenderness nor edema. No knee or ankle tenderness. Normal ROM of the right hip with some pain noted. Normal ROM of the right shoulder with some pain noted. No deformity. No elbow tenderness. No wrist/scaphoid tenderness.  Neurologic:  Normal speech and language. No gross focal neurologic deficits are appreciated.  Skin:  Skin is warm, dry and intact. No rash noted.  ____________________________________________   LABS (all labs ordered are listed, but only abnormal results are displayed)  Labs Reviewed  COMPREHENSIVE METABOLIC PANEL - Abnormal; Notable for the following components:      Result Value   Glucose, Bld 103 (*)    Calcium 8.8 (*)    All other components within normal limits  CBC WITH DIFFERENTIAL/PLATELET - Abnormal; Notable for the following components:   Eosinophils Absolute 0.7 (*)    All other components within normal limits  LITHIUM LEVEL - Abnormal; Notable for the following components:   Lithium Lvl 0.18 (*)    All other components within normal limits  URINE CULTURE  TROPONIN I  URINALYSIS, ROUTINE W REFLEX MICROSCOPIC   ____________________________________________  EKG   EKG Interpretation  Date/Time:  Sunday October 21 2018 08:28:20 EST Ventricular Rate:  74 PR Interval:    QRS Duration: 89 QT Interval:  399 QTC  Calculation: 443 R Axis:   55 Text Interpretation:  Sinus rhythm Abnormal R-wave progression, early transition No STEMI.  Confirmed by Nanda Quinton [phone]) on 10/21/2018 8:33:22 AM       ____________________________________________  RADIOLOGY  Dg Shoulder Right  Result Date: 10/21/2018 CLINICAL DATA:  RIGHT shoulder pain after a fall yesterday. Initial encounter. EXAM: RIGHT SHOULDER - 2+ VIEW COMPARISON:  None. FINDINGS: No evidence of acute fracture. Glenohumeral joint anatomically aligned with well-preserved joint space. Subacromial space well-preserved. Acromioclavicular joint intact with minimal degenerative changes. Bone mineral density well-preserved. IMPRESSION: No acute osseous abnormality. Minimal AC joint degenerative changes. Electronically Signed   By: Evangeline Dakin M.D.   On: 10/21/2018 09:51   Ct Head Wo Contrast  Result Date: 10/21/2018 CLINICAL DATA:  Multiple falls over the past 3 months with balance disturbance. Fall yesterday at which time the patient struck her head on the floor. Patient denies loss of consciousness. Initial encounter. EXAM: CT HEAD WITHOUT  CONTRAST CT CERVICAL SPINE WITHOUT CONTRAST TECHNIQUE: Multidetector CT imaging of the head and cervical spine was performed following the standard protocol without intravenous contrast. Multiplanar CT image reconstructions of the cervical spine were also generated. COMPARISON:  CT head and cervical spine 09/08/2018. CT head 12/01/2017. FINDINGS: CT HEAD FINDINGS Brain: Ventricular system normal in size and appearance for age. No mass lesion. No midline shift. No acute hemorrhage or hematoma. No extra-axial fluid collections. No evidence of acute infarction. No focal brain parenchymal abnormalities. No interval change. Vascular: Moderate BILATERAL carotid siphon and mild BILATERAL vertebral artery atherosclerosis. No hyperdense vessel. Skull: No skull fracture or other focal osseous abnormality involving the skull. Note is  made of degenerative changes in the BILATERAL temporomandibular joints. Sinuses/Orbits: Mild mucosal thickening involving the RIGHT maxillary sinus and opacification POSTERIOR RIGHT ethmoid air cells. Remaining visualized paranasal sinuses, BILATERAL mastoid air cells and BILATERAL middle ear cavities well-aerated. Orbits and globes normal in appearance. Other: None. CT CERVICAL SPINE FINDINGS Alignment: Straightening of the usual cervical lordosis. Anatomic POSTERIOR alignment. Facet joints anatomically aligned throughout with degenerative changes. Skull base and vertebrae: No fractures identified involving the cervical spine. Coronal reformatted images demonstrate an intact craniocervical junction with mild degenerative changes, intact dens and intact lateral masses throughout. Soft tissues and spinal canal: No evidence of paraspinous or spinal canal hematoma. No evidence of spinal stenosis. Disc levels: Severe disc space narrowing and associated endplate hypertrophic changes at C3-4, C4-5, C5-6 and C6-7, worst at C5-6. Congenital or acquired fusion of the LEFT C2 and C3 facet joints. Combination of facet and uncinate hypertrophy account for multilevel foraminal stenoses including severe BILATERAL C3-4, moderate to severe BILATERAL C4-5, severe LEFT and mild RIGHT C5-6. Upper chest: Abrupt tracheal deviation to the RIGHT at the thoracic inlet, without evidence of mass or thyromegaly. Visualized superior mediastinum otherwise unremarkable. Visualized lung apices clear. Other: None. IMPRESSION: 1. No acute intracranial abnormality. 2. No cervical spine fractures identified. 3. Multilevel degenerative disc disease, spondylosis and facet degenerative changes with multilevel foraminal stenoses as detailed above. 4. Mild chronic RIGHT maxillary and RIGHT POSTERIOR ethmoid sinus disease. Electronically Signed   By: Evangeline Dakin M.D.   On: 10/21/2018 10:12   Ct Cervical Spine Wo Contrast  Result Date:  10/21/2018 CLINICAL DATA:  Multiple falls over the past 3 months with balance disturbance. Fall yesterday at which time the patient struck her head on the floor. Patient denies loss of consciousness. Initial encounter. EXAM: CT HEAD WITHOUT CONTRAST CT CERVICAL SPINE WITHOUT CONTRAST TECHNIQUE: Multidetector CT imaging of the head and cervical spine was performed following the standard protocol without intravenous contrast. Multiplanar CT image reconstructions of the cervical spine were also generated. COMPARISON:  CT head and cervical spine 09/08/2018. CT head 12/01/2017. FINDINGS: CT HEAD FINDINGS Brain: Ventricular system normal in size and appearance for age. No mass lesion. No midline shift. No acute hemorrhage or hematoma. No extra-axial fluid collections. No evidence of acute infarction. No focal brain parenchymal abnormalities. No interval change. Vascular: Moderate BILATERAL carotid siphon and mild BILATERAL vertebral artery atherosclerosis. No hyperdense vessel. Skull: No skull fracture or other focal osseous abnormality involving the skull. Note is made of degenerative changes in the BILATERAL temporomandibular joints. Sinuses/Orbits: Mild mucosal thickening involving the RIGHT maxillary sinus and opacification POSTERIOR RIGHT ethmoid air cells. Remaining visualized paranasal sinuses, BILATERAL mastoid air cells and BILATERAL middle ear cavities well-aerated. Orbits and globes normal in appearance. Other: None. CT CERVICAL SPINE FINDINGS Alignment: Straightening of the usual cervical lordosis.  Anatomic POSTERIOR alignment. Facet joints anatomically aligned throughout with degenerative changes. Skull base and vertebrae: No fractures identified involving the cervical spine. Coronal reformatted images demonstrate an intact craniocervical junction with mild degenerative changes, intact dens and intact lateral masses throughout. Soft tissues and spinal canal: No evidence of paraspinous or spinal canal  hematoma. No evidence of spinal stenosis. Disc levels: Severe disc space narrowing and associated endplate hypertrophic changes at C3-4, C4-5, C5-6 and C6-7, worst at C5-6. Congenital or acquired fusion of the LEFT C2 and C3 facet joints. Combination of facet and uncinate hypertrophy account for multilevel foraminal stenoses including severe BILATERAL C3-4, moderate to severe BILATERAL C4-5, severe LEFT and mild RIGHT C5-6. Upper chest: Abrupt tracheal deviation to the RIGHT at the thoracic inlet, without evidence of mass or thyromegaly. Visualized superior mediastinum otherwise unremarkable. Visualized lung apices clear. Other: None. IMPRESSION: 1. No acute intracranial abnormality. 2. No cervical spine fractures identified. 3. Multilevel degenerative disc disease, spondylosis and facet degenerative changes with multilevel foraminal stenoses as detailed above. 4. Mild chronic RIGHT maxillary and RIGHT POSTERIOR ethmoid sinus disease. Electronically Signed   By: Evangeline Dakin M.D.   On: 10/21/2018 10:12   Dg Hip Unilat W Or Wo Pelvis 2-3 Views Right  Result Date: 10/21/2018 CLINICAL DATA:  RIGHT hip pain after a fall yesterday. Initial encounter. Prior BILATERAL hip arthroplasties. EXAM: DG HIP (WITH OR WITHOUT PELVIS) 2-3V RIGHT COMPARISON:  AP pelvis 09/08/2018. RIGHT hip and AP pelvis 01/15/2013. FINDINGS: No evidence of acute fracture. RIGHT hip prosthesis anatomically aligned without complicating features. Included AP pelvis demonstrates anatomic alignment of the contralateral LEFT hip prosthesis. Minimal lucency surrounding the distal portion of the LEFT femoral stem and cortical thickening overlying the distal femoral stem indicating buttressing. IMPRESSION: 1. No acute osseous abnormality. 2. Anatomic alignment of the RIGHT hip prosthesis without complicating features. 3. Minimal lucency surrounding the distal femoral stem of the LEFT hip prosthesis which likely indicates minimal loosening or  granulomatosis. If the patient is not having LEFT UPPER leg pain, then granulomatosis is most likely. Electronically Signed   By: Evangeline Dakin M.D.   On: 10/21/2018 09:56    ____________________________________________   PROCEDURES  Procedure(s) performed:   Procedures  None  ____________________________________________   INITIAL IMPRESSION / ASSESSMENT AND PLAN / ED COURSE  Pertinent labs & imaging results that were available during my care of the patient were reviewed by me and considered in my medical decision making (see chart for details).  Patient presents to the emergency department after a fall.  Most recent fall was yesterday afternoon and she is having pain in several locations which will require imaging.  I do not have high suspicion for stroke.  Patient describes episodic loss of balance without near syncope symptoms.  No persistent balance difficulty.  No weakness/numbness.  No vision changes.  Patient does have an artificial hip on the right.  Plan for plain films along with screening labs.  EKG is unremarkable.  Lab work and imaging reviewed with no acute findings.  Patient is not having pain on the left.  I discussed the x-ray findings with her and she can follow-up with her orthopedist as needed or if pain develops.  Patient is ambulatory in the emergency department.  Feeling improved with Percocet.  I will give a very small number of this for home use.  Also ordered home health, walker, and wrote a work note.  Discussed side effects of Percocet including constipation, lightheadedness, and dependency.  I reviewed  the [place] drug database prior to driving this medication.  ____________________________________________  FINAL CLINICAL IMPRESSION(S) / ED DIAGNOSES  Final diagnoses:  Fall, initial encounter  Injury of head, initial encounter  Strain of neck muscle, initial encounter  Acute pain of right shoulder  Right hip pain     MEDICATIONS GIVEN DURING  THIS VISIT:  Medications  oxyCODONE-acetaminophen (PERCOCET/ROXICET) 5-325 MG per tablet 1 tablet (1 tablet Oral Given 10/21/18 0903)     NEW OUTPATIENT MEDICATIONS STARTED DURING THIS VISIT:  Discharge Medication List as of 10/21/2018 11:36 AM    START taking these medications   Details  oxyCODONE-acetaminophen (PERCOCET/ROXICET) 5-325 MG tablet Take 1 tablet by mouth every 6 (six) hours as needed for severe pain., Starting Sun 10/21/2018, Normal    senna-docusate (SENOKOT-S) 8.6-50 MG tablet Take 1 tablet by mouth at bedtime as needed for mild constipation or moderate constipation., Starting Sun 10/21/2018, Normal        Note:  This document was prepared using Dragon voice recognition software and may include unintentional dictation errors.  Nanda Quinton, MD Emergency Medicine    Lennis Rader, [facility], MD 10/21/18 1537

## 2018-10-21 NOTE — ED Triage Notes (Signed)
Patient reports falling frequent;y x3 months. Patient states "I feel like my balance is off." Patient states stumbled yesterday after turning and tripped over door frame. Patient states she landed on right side of her body. Patient c/o right hip pain, right shoulder pain, and neck pain. Per patient hit head. Denies any LOC or blurred vision. Denies taking any type of anticoagulants.

## 2018-10-21 NOTE — ED Notes (Signed)
Notified need for urine sample. Will call staff when able to provide.

## 2018-10-22 ENCOUNTER — Telehealth: Payer: Self-pay | Admitting: Psychiatry

## 2018-10-22 ENCOUNTER — Ambulatory Visit: Payer: BLUE CROSS/BLUE SHIELD | Admitting: Psychiatry

## 2018-10-22 ENCOUNTER — Other Ambulatory Visit: Payer: Self-pay | Admitting: Psychiatry

## 2018-10-22 MED ORDER — CARBAMAZEPINE ER 200 MG PO CP12: 200.0000 mg | ORAL_CAPSULE | Freq: Every day | ORAL | 0 refills | Status: DC

## 2018-10-22 NOTE — Telephone Encounter (Signed)
We will try to work her into an earlier visit.  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  Therefore I would suggest we reduce the Equetro before her appointment.  Reduce the Equetro to 200 mg nightly.  She will need a new prescription for that and 1 was sent and for her.  Alternatively she could come here and pick up samples to try the lower dosage first.  Lynder Parents, MD

## 2018-10-22 NOTE — Progress Notes (Signed)
Patient called complaining of frequent falls.  It is possible that the Equetro could be contributing.  It is the most likely med in her list of meds to cause balance problems.  We will reduce the dosage from 300 to 200 mg at bedtime and try to see the patient as soon as possible Lynder Parents, MD, DFAPA

## 2018-10-22 NOTE — Telephone Encounter (Signed)
Patient stated she had a severe  fall with 2 visits to the ER. Per conversations with CC she stated it may be medication related. Scheduled appt 3/17 but requesting a work in earlier. Please advise

## 2018-10-23 LAB — URINE CULTURE
Culture: NO GROWTH
Special Requests: NORMAL

## 2018-10-23 NOTE — Telephone Encounter (Signed)
Left voice mail to call back 

## 2018-10-24 ENCOUNTER — Ambulatory Visit: Payer: BLUE CROSS/BLUE SHIELD | Admitting: Psychiatry

## 2018-10-24 DIAGNOSIS — F319 Bipolar disorder, unspecified: Secondary | ICD-10-CM

## 2018-10-24 NOTE — Progress Notes (Signed)
[facility] Counselor/Therapist Progress Note  Patient ID: Megan Whitney, MRN: [id number],    Date: 10/24/2018  Time Spent: 60 minutes  Treatment Type: Individual Therapy  Reported Symptoms:  Anger, anxiety, depression, concerned about 2 recent falls, reports previous suicidal ideation earlier in the week but denies any currently.   Mental Status Exam:  Appearance:   Casual     Behavior:  Appropriate and Sharing  Motor:  Normal  Speech/Language:   Normal Rate  Affect:  Congruent  Mood:  angry, anxious and depressed  Thought process:  normal  Thought content:    WNL  Sensory/Perceptual disturbances:    WNL  Orientation:  oriented to person, place, time/date, situation, day of week, month of year and year  Attention:  Good  Concentration:  Good  Memory:  WNL  Fund of knowledge:   Good  Insight:    Fair  Judgment:   Fair  Impulse Control:  Fair   Risk Assessment: Danger to Self:  No Self-injurious Behavior: No Danger to Others: No Duty to Warn:no Physical Aggression / Violence:No  Access to Firearms a concern: No  Gang Involvement:No   Subjective:  Patient in today reporting the above named symptoms which include recent suicidal ideation earlier this week. When asked if she got to the point of having a plan, she states that she would probably take all her pills.  Denies any current suicidal ideation or plan.  Reports that "suicidal thoughts are not nearly as bad since starting my bipolar meds".  Still feeling short-tempered and on edge especially with husband and his alcohol consumption.   Patient concerned that parents of grand-daughter may not keep allowing her to be around them if husband continues to drink excessively.  Patient figuring out how she can still maintain contact with grand-daughter and family even if they set limits with patient's husband.    Has had 3 falls within past 1 1/2 months-- 1 at work in her office where she sustained some bruises, another  incident where she tripped up in parking lot of shopping area and bruised chin and Bit almost through her lower lip area. Was treated and released at ED at [facility] and released.  The most recent fall was this past Saturday 10/20/2018 when she fell at home and bruised her right hip badly and having to use a cane at this point. She went to ED at hospital again, where the did "a brain scan and neuro check" but did not find any concerns. Patient released from ED, doctor wrote her out of work for 3 days, and supported her use of walker. Patient states she has had increased balance problems within past couple months and wonders if it could be related to her meds.  States pharmacist shared with her that some of her meds could potentially lead to some balance issues.   Feels like things have been so chaotic with all the above happening, but is to return to work this week. States she does need to be at work.  Patient also states that she knows what to do that will help including:  Stay on my meds as prescribed, stay involved with my hobbies such knitting, practice deep breathing exercises, daily practicing CBT strategies discussed in recent sessions and again today, and calming self by interacting with my pet dog.  Also to try to refrain from comfort eating and choosing to walk or do other exercise some each day.  Gave patient an updated copy  of CBT diagram of strategies today. Plans to get up a bit earlier to allow her to read daily devotionals which finds supportive.    **To check on sooner appt with Dr. Clovis Pu, especially since her recent falls.  Checked in again with patient before she left today and she continues to deny any suicidal/homidial thoughts/plans and states she has not had any the past 2 days and adds that she feels very supported here at [facility] and knows that she can call when needed.  Interventions: Cognitive Behavioral Therapy and Ego-Supportive  Diagnosis:   ICD-10-CM   1. Bipolar I  disorder with rapid cycling ([place]) F31.9     Plan:   Patient to follow through with plans mentioned above.  To return 2-3 wks for continued goal-directed treatment.  Shanon Ace, LCSW

## 2018-10-25 ENCOUNTER — Ambulatory Visit: Payer: BLUE CROSS/BLUE SHIELD | Admitting: Psychiatry

## 2018-10-25 DIAGNOSIS — M25551 Pain in right hip: Secondary | ICD-10-CM | POA: Diagnosis not present

## 2018-10-25 DIAGNOSIS — M79604 Pain in right leg: Secondary | ICD-10-CM | POA: Diagnosis not present

## 2018-10-25 DIAGNOSIS — M62838 Other muscle spasm: Secondary | ICD-10-CM | POA: Diagnosis not present

## 2018-10-25 NOTE — Telephone Encounter (Signed)
Left voice mail to call back 

## 2018-10-25 NOTE — Care Management (Signed)
CM received call from pt regarding [facility] referral that was supposed to be made on Sunday. This CM department had not received notification of referral. Pt has used [facility] in the past and request them again. Vaughan Basta, [facility] rep, given referral and will make request pt be seen today.

## 2018-10-25 NOTE — Telephone Encounter (Signed)
Pt says she hadn't called back because of health issues. She will have someone pick up samples of equetro 200 mg.

## 2018-10-26 ENCOUNTER — Ambulatory Visit: Payer: BLUE CROSS/BLUE SHIELD | Admitting: Psychiatry

## 2018-10-30 ENCOUNTER — Ambulatory Visit: Payer: BLUE CROSS/BLUE SHIELD | Admitting: Psychiatry

## 2018-10-30 DIAGNOSIS — M25551 Pain in right hip: Secondary | ICD-10-CM | POA: Diagnosis not present

## 2018-10-31 DIAGNOSIS — M25551 Pain in right hip: Secondary | ICD-10-CM | POA: Diagnosis not present

## 2018-11-01 ENCOUNTER — Other Ambulatory Visit: Payer: Self-pay | Admitting: Psychiatry

## 2018-11-01 ENCOUNTER — Telehealth: Payer: Self-pay | Admitting: Psychiatry

## 2018-11-01 DIAGNOSIS — M25551 Pain in right hip: Secondary | ICD-10-CM | POA: Diagnosis not present

## 2018-11-01 DIAGNOSIS — S32591A Other specified fracture of right pubis, initial encounter for closed fracture: Secondary | ICD-10-CM | POA: Diagnosis not present

## 2018-11-01 DIAGNOSIS — S32511A Fracture of superior rim of right pubis, initial encounter for closed fracture: Secondary | ICD-10-CM | POA: Diagnosis not present

## 2018-11-01 NOTE — Telephone Encounter (Signed)
Already submitted to pharmacy per request

## 2018-11-01 NOTE — Telephone Encounter (Signed)
Pt called for refill on Vraylar  @ [facility]. Next appt 12/04/2018

## 2018-11-08 ENCOUNTER — Ambulatory Visit: Payer: BLUE CROSS/BLUE SHIELD | Admitting: Psychiatry

## 2018-11-14 ENCOUNTER — Ambulatory Visit: Payer: BLUE CROSS/BLUE SHIELD | Admitting: Psychiatry

## 2018-11-20 ENCOUNTER — Ambulatory Visit: Payer: BLUE CROSS/BLUE SHIELD | Admitting: Psychiatry

## 2018-11-21 ENCOUNTER — Ambulatory Visit: Payer: BLUE CROSS/BLUE SHIELD | Admitting: Psychiatry

## 2018-11-26 ENCOUNTER — Telehealth: Payer: Self-pay

## 2018-11-26 NOTE — Telephone Encounter (Signed)
Prior authorization submitted for Vraylar 4.5 through [facility] approved effective 11/26/2018-11/24/2021

## 2018-11-28 DIAGNOSIS — S32519A Fracture of superior rim of unspecified pubis, initial encounter for closed fracture: Secondary | ICD-10-CM | POA: Insufficient documentation

## 2018-11-28 HISTORY — DX: Fracture of superior rim of unspecified pubis, initial encounter for closed fracture: S32.519A

## 2018-11-29 DIAGNOSIS — S32511D Fracture of superior rim of right pubis, subsequent encounter for fracture with routine healing: Secondary | ICD-10-CM | POA: Diagnosis not present

## 2018-11-29 DIAGNOSIS — S32591D Other specified fracture of right pubis, subsequent encounter for fracture with routine healing: Secondary | ICD-10-CM | POA: Diagnosis not present

## 2018-11-29 DIAGNOSIS — M25551 Pain in right hip: Secondary | ICD-10-CM | POA: Diagnosis not present

## 2018-11-30 ENCOUNTER — Telehealth: Payer: Self-pay | Admitting: Psychiatry

## 2018-11-30 ENCOUNTER — Other Ambulatory Visit: Payer: Self-pay | Admitting: Psychiatry

## 2018-11-30 DIAGNOSIS — R296 Repeated falls: Secondary | ICD-10-CM

## 2018-11-30 DIAGNOSIS — F319 Bipolar disorder, unspecified: Secondary | ICD-10-CM

## 2018-11-30 DIAGNOSIS — Z79899 Other long term (current) drug therapy: Secondary | ICD-10-CM

## 2018-11-30 NOTE — Telephone Encounter (Signed)
Patient called and said that she saw another doctor appt about her hip. She is experiencing her feet tingling,dragging her feet and has had two falls. The doctor she saw thought that might be caused her mediciations that she is on. If this not from the medicines then she will go see a nureolgist. Please let her know [phone]

## 2018-11-30 NOTE — Telephone Encounter (Signed)
The only medication that I am prescribing that might affect her balance is Equetro 300 mg 1 at night.  She has been on the same dosage for several months and that is the lowest dosage that we generally see effective.  Most patients take much more than that.  I can check a carbamazepine blood level to ensure that it is not higher than expected.  At her convenience go to the laboratory [facility] any 1 of the [place] laboratories and they will have the request for the carbamazepine level

## 2018-11-30 NOTE — Telephone Encounter (Signed)
Given instructions and she will check labs when she can, instructed to call before she goes hours may vary with pandemic.

## 2018-12-03 ENCOUNTER — Other Ambulatory Visit: Payer: Self-pay

## 2018-12-03 ENCOUNTER — Ambulatory Visit (INDEPENDENT_AMBULATORY_CARE_PROVIDER_SITE_OTHER): Payer: BLUE CROSS/BLUE SHIELD | Admitting: Psychiatry

## 2018-12-03 DIAGNOSIS — F319 Bipolar disorder, unspecified: Secondary | ICD-10-CM

## 2018-12-03 NOTE — Progress Notes (Signed)
[facility] Counselor/Therapist Progress Note  Patient ID: Megan Whitney, MRN: [id number],    Date: 12/03/2018  Time Spent: 60 minutes   11:05am to 12:05pm  Treatment Type: Individual Therapy   Virtual Visit via Telephone Note I connected with patient by a video enabled telemedicine application or telephone, with their informed consent, and verified patient privacy and that I am speaking with the correct person using two identifiers. I am at [place] and patient is at home.   I discussed the limitations, risks, security and privacy concerns of performing psychotherapy and management service by telephone and the availability of in person appointments. I also discussed with the patient that there may be a patient responsible charge related to this service. The patient expressed understanding and agreed to proceed.  I discussed the treatment planning with the patient. The patient was provided an opportunity to ask questions and all were answered. The patient agreed with the plan and demonstrated an understanding of the instructions.   The patient was advised to call  our office if  symptoms worsen or feel they are in a crisis state and need immediate contact.   Reported Symptoms:  Anger, anxiety, depression,   Mental Status Exam:  Appearance:    n/a  Behavior:  Sharing  Motor:  Normal  Speech/Language:   Normal Rate  Affect:  n/a  Mood:  angry, anxious and depressed  Thought process:  normal  Thought content:    WNL  Sensory/Perceptual disturbances:    WNL  Orientation:  oriented to person, place, time/date, situation, day of week, month of year and year  Attention:  Good  Concentration:  Good  Memory:  WNL  Fund of knowledge:   Good  Insight:    Fair  Judgment:   Fair  Impulse Control:  Fair   Risk Assessment: Danger to Self:  No Self-injurious Behavior: No Danger to Others: No Duty to Warn:no Physical Aggression / Violence:No  Access to Firearms a  concern: No  Gang Involvement:No   Subjective:  Patient today reporting the above named symptoms.  Also reports her prior suicidal thoughts are not happening anymore and have not returned since her last appt with me.   Still feeling short-tempered and on edge especially with husband and his alcohol consumption.   Patient concerned that parents of grand-daughter may not keep allowing her to be around them if husband continues to drink excessively.  Patient figured out how she can still maintain contact with grand-daughter and family during this pandemic time.  Feels in the midst of pandemic, her depression is worse, although denies any suicidal thoughts.    Patient fell and broke pelvis on 10/20/2018 and was in bed for 5 wks.  Just getting to the point in past few days without a walker or cane.  Reports that PA Richardson Landry) for Dr. Maureen Ralphs at [facility] (formerly [facility] told her that her that she may have tardive dyskinesia from her Arman Filter, and that could cause her to fall.  Patient told him that she would be speaking with Dr. Clovis Pu tomorrow about her meds.    Patient staying involved with hobbies such knitting, practice deep breathing exercises, daily practicing CBT strategies discussed in recent sessions and again today, and calming self by interacting with her  pet dog.  Is "comfort eating some due to all the stress", and is beginning to work on decreasing this habit and planning to use the app Noom.  Using daily devotionals which she  finds supportive.    Interventions: Cognitive Behavioral Therapy and Ego-Supportive  Diagnosis:   ICD-10-CM   1. Bipolar I disorder with rapid cycling ([place]) F31.9     Plan:   Patient to follow through with plans mentioned above.  Next appt will be within  2-3 wks for continued goal-directed treatment.  Shanon Ace, LCSW

## 2018-12-04 ENCOUNTER — Ambulatory Visit (INDEPENDENT_AMBULATORY_CARE_PROVIDER_SITE_OTHER): Payer: BLUE CROSS/BLUE SHIELD | Admitting: Psychiatry

## 2018-12-04 ENCOUNTER — Encounter: Payer: Self-pay | Admitting: Psychiatry

## 2018-12-04 DIAGNOSIS — R7989 Other specified abnormal findings of blood chemistry: Secondary | ICD-10-CM

## 2018-12-04 DIAGNOSIS — F411 Generalized anxiety disorder: Secondary | ICD-10-CM

## 2018-12-04 DIAGNOSIS — F319 Bipolar disorder, unspecified: Secondary | ICD-10-CM | POA: Diagnosis not present

## 2018-12-04 DIAGNOSIS — F900 Attention-deficit hyperactivity disorder, predominantly inattentive type: Secondary | ICD-10-CM

## 2018-12-04 DIAGNOSIS — G251 Drug-induced tremor: Secondary | ICD-10-CM

## 2018-12-04 DIAGNOSIS — R296 Repeated falls: Secondary | ICD-10-CM

## 2018-12-04 DIAGNOSIS — G3184 Mild cognitive impairment, so stated: Secondary | ICD-10-CM

## 2018-12-04 MED ORDER — FLUOXETINE HCL 20 MG PO CAPS: 40.0000 mg | ORAL_CAPSULE | Freq: Every day | ORAL | 1 refills | Status: DC

## 2018-12-04 MED ORDER — CARIPRAZINE HCL 3 MG PO CAPS: 3.0000 mg | ORAL_CAPSULE | Freq: Every day | ORAL | 1 refills | Status: DC

## 2018-12-04 NOTE — Progress Notes (Signed)
Megan Whitney [id number] 20-Oct-1955 63 y.o.  Subjective:   Patient ID:  Megan Whitney is a 63 y.o. (DOB 07-11-1956) female.  Chief Complaint:  Chief Complaint  Patient presents with  . falling  . Anxiety    HPI Megan Whitney presents to the office today for follow-up of recent mood swings and recent falling.  Last seen September 24, 2018.  There were no med changes at that visit.  However subsequently she has called multiple times complaining of falls.  Some other physicians had suggested that perhaps her psych meds were contributing because there were no other obvious causes.  She is not yet had a neurologic evaluation apparently.  4 falls and 2 ER visits and last broken pelvis. Feburary 15. Face and mouth contort and toes constantly moving. Severe tremor bilateral. Shuffle feet both.  Poor balance but still drools.  Reduced Equetro 200 and No differences noticed.  A little depression and anxiety.  1 anger issue.  After the last visit serum vitamin D level was determined to be low at 33.  The goal and chronically depressed patient's is in the 50s if possible.  So her vitamin D was increased on December 4 or thereabouts.  This past week has had irritability and rage internally but using CBT per therapist to try and keep it under control.  Some triggers and a lot of stressors in 3 weeks and not enough sleep.  Going to bed on time but dog gets her up to go out and hard to get back to sleep.  Avg 7-8 hours.  No hyperactivity.  ? Impulsive except anger.  has noticed more tremor in her hands and worse in the morning. Tremor is noticeable and embarrassing but doesn't want to stop the Vraylar DT the benefit for her mood. Tremor better with the propranolol.  Seasonally the worst time of year for her and she doesn't want to change.  She is having more cognitive problems which she associates with this time of year as well.  Review of Systems:  Review of Systems  Musculoskeletal: Positive  for arthralgias and gait problem.  Neurological: Positive for tremors. Negative for dizziness and weakness.       Others see HPI  Psychiatric/Behavioral: Negative for agitation, behavioral problems, confusion, decreased concentration, dysphoric mood, hallucinations, self-injury, sleep disturbance and suicidal ideas. The patient is nervous/anxious. The patient is not hyperactive.     Medications: I have reviewed the patient's current medications.  Current Outpatient Medications  Medication Sig Dispense Refill  . atorvastatin (LIPITOR) 10 MG tablet Take 10 mg by mouth daily.    . carbamazepine (EQUETRO) 200 MG CP12 12 hr capsule Take 1 capsule (200 mg total) by mouth at bedtime. Stop the Equetro 300 mg capsule 60 each 0  . cetirizine (ZYRTEC) 10 MG tablet Take 10 mg by mouth daily.    . Cholecalciferol 1.25 MG (50000 UT) capsule Take 1 capsule (50,000 Units total) by mouth 3 (three) times a week. 12 capsule 5  . dexmethylphenidate (FOCALIN XR) 20 MG 24 hr capsule Take 1 capsule (20 mg total) by mouth daily. 30 capsule 0  . dexmethylphenidate (FOCALIN) 10 MG tablet Take 1 tablet (10 mg total) by mouth every evening. 30 tablet 0  . famotidine (PEPCID) 20 MG tablet Take 20 mg by mouth daily.    . ferrous gluconate (FERGON) 324 MG tablet Take 324 mg by mouth daily with breakfast.    . lamoTRIgine (LAMICTAL) 200 MG tablet Take 1 tablet (  200 mg total) by mouth 2 (two) times daily. 60 tablet 2  . lithium carbonate 150 MG capsule Take 1 capsule (150 mg total) by mouth daily. 90 capsule 1  . pantoprazole (PROTONIX) 40 MG tablet Take 40 mg by mouth daily.    Marland Kitchen senna-docusate (SENOKOT-S) 8.6-50 MG tablet Take 1 tablet by mouth at bedtime as needed for mild constipation or moderate constipation. 14 tablet 0  . oxyCODONE-acetaminophen (PERCOCET/ROXICET) 5-325 MG tablet Take 1 tablet by mouth every 6 (six) hours as needed for severe pain. (Patient not taking: Reported on 12/04/2018) 7 tablet 0  . propranolol  (INDERAL) 10 MG tablet TAKE 1 TO 3 TABLETS BY MOUTH DAILY AS NEEDED FOR TREMOR (Patient not taking: (BETA BLOCKER)) 90 tablet 0   No current facility-administered medications for this visit.     Medication Side Effects: Other: tremor and weight gain. falling and dyskinesia  Allergies:  Allergies  Allergen Reactions  . Azithromycin Anaphylaxis  . Penicillins Anaphylaxis    DID THE REACTION INVOLVE: Swelling of the face/tongue/throat, SOB, or low BP? Yes Sudden or severe rash/hives, skin peeling, or the inside of the mouth or nose? Yes Did it require medical treatment? No When did it last happen? If all above answers are "NO", may proceed with cephalosporin use.  . Adhesive [Tape]     On bandaids    Past Medical History:  Diagnosis Date  . Anemia    History of GI blood loss  . Anxiety   . Arthritis   . Bipolar 1 disorder ([place])   . Colon polyps   . Depression   . Epistaxis    Around 2011 or 2012, required cauterization.   . Esophageal stricture   . GERD (gastroesophageal reflux disease)   . Headache(784.0)   . Hyperlipidemia   . Interstitial cystitis   . Lung cancer ([place]) 2002  . Obesity   . Sleep apnea    Doesn't use a CPAP    Family History  Problem Relation Age of Onset  . Arthritis Mother   . Hypertension Father   . Diabetes Mellitus II Father   . Heart disease Father   . Colon cancer Neg Hx   . Esophageal cancer Neg Hx   . Rectal cancer Neg Hx     Social History   Socioeconomic History  . Marital status: Married    Spouse name: Not on file  . Number of children: 1  . Years of education: Not on file  . Highest education level: Not on file  Occupational History  . Occupation: Admin. assistant  Social Needs  . Financial resource strain: Not on file  . Food insecurity:    Worry: Not on file    Inability: Not on file  . Transportation needs:    Medical: Not on file    Non-medical: Not on file  Tobacco Use  . Smoking status: Never Smoker  .  Smokeless tobacco: Never Used  Substance and Sexual Activity  . Alcohol use: Yes    Alcohol/week: 1.0 standard drinks    Types: 1 Glasses of wine per week    Comment: Moderate  . Drug use: No  . Sexual activity: Not on file  Lifestyle  . Physical activity:    Days per week: Not on file    Minutes per session: Not on file  . Stress: Not on file  Relationships  . Social connections:    Talks on phone: Not on file    Gets together: Not on  file    Attends religious service: Not on file    Active member of club or organization: Not on file    Attends meetings of clubs or organizations: Not on file    Relationship status: Not on file  . Intimate partner violence:    Fear of current or ex partner: Not on file    Emotionally abused: Not on file    Physically abused: Not on file    Forced sexual activity: Not on file  Other Topics Concern  . Not on file  Social History Narrative  . Not on file    Past Medical History, Surgical history, Social history, and Family history were reviewed and updated as appropriate.   Please see review of systems for further details on the patient's review from today.   Objective:   Physical Exam:  There were no vitals taken for this visit.  Physical Exam Neurological:     Mental Status: She is alert and oriented to person, place, and time.     Cranial Nerves: No dysarthria.  Psychiatric:        Attention and Perception: Attention normal. She does not perceive auditory or visual hallucinations.        Mood and Affect: Mood is anxious. Mood is not depressed. Affect is not angry, tearful or inappropriate.        Speech: Speech normal. Speech is not rapid and pressured or slurred.        Behavior: Behavior is cooperative.        Thought Content: Thought content normal. Thought content is not paranoid or delusional. Thought content does not include homicidal or suicidal ideation. Thought content does not include homicidal or suicidal plan.         Cognition and Memory: She exhibits impaired recent memory.        Judgment: Judgment normal.     Comments: There was no dysarthria evident in the phone conversation.     Lab Review:     Component Value Date/Time   NA 136 10/21/2018 0858   K 4.0 10/21/2018 0858   CL 106 10/21/2018 0858   CO2 24 10/21/2018 0858   GLUCOSE 103 (H) 10/21/2018 0858   BUN 22 10/21/2018 0858   CREATININE 0.75 10/21/2018 0858   CALCIUM 8.8 (L) 10/21/2018 0858   PROT 6.6 10/21/2018 0858   ALBUMIN 3.6 10/21/2018 0858   AST 22 10/21/2018 0858   ALT 16 10/21/2018 0858   ALKPHOS 58 10/21/2018 0858   BILITOT 0.5 10/21/2018 0858   GFRNONAA >60 10/21/2018 0858   GFRAA >60 10/21/2018 0858       Component Value Date/Time   WBC 8.2 10/21/2018 0858   RBC 4.18 10/21/2018 0858   HGB 12.2 10/21/2018 0858   HCT 38.7 10/21/2018 0858   PLT 211 10/21/2018 0858   MCV 92.6 10/21/2018 0858   MCH 29.2 10/21/2018 0858   MCHC 31.5 10/21/2018 0858   RDW 13.9 10/21/2018 0858   LYMPHSABS 1.9 10/21/2018 0858   MONOABS 0.9 10/21/2018 0858   EOSABS 0.7 (H) 10/21/2018 0858   BASOSABS 0.0 10/21/2018 0858  Vitamin D level 33 on 10K units daily on 12/4/`9 Increased to prescription vitamin d 50K units Monday, Wed, Friday.  Rx sent in.   Lithium Lvl  Date Value Ref Range Status  10/21/2018 0.18 (L) 0.60 - 1.20 mmol/L Final    Comment:    Performed at [facility], [address] [place], [place] [address]     No results  found for: PHENYTOIN, PHENOBARB, VALPROATE, CBMZ   .res Assessment: Plan:    Falling  Bipolar I disorder with rapid cycling ([place])  Generalized anxiety disorder  Attention deficit hyperactivity disorder (ADHD), predominantly inattentive type  Mild cognitive impairment  Tremor due to multiple drugs  Low vitamin D level  Forest has chronic rapid cycling bipolar disorder which is chronically unstable and has been difficult to control.  In September her fluoxetine was increased to 60 mg a day due  to tearfulness.  Her mood is fairly stable at the moment.  The reduction in Equetro did not help the balance issues as best we can tell nor did it worsen mood so far.  Very concerned about her description of contorting her mouth this could be the development might of tardive dyskinesia.  However she also appears to be having some parkinsonism as she describes shuffling gait.  In either case we need to reduce the Vraylar in hopes of improving the symptoms.  Discussed tardive dyskinesia.  Discussed the potential for neurologic evaluation.  However that would best occur after her Vraylar blood level has been reduced.  Stop Vraylar for 4 days then resume at 3 mg daily.  Because of concerns about rapid cycling being worse with the reduction in Vraylar, reduce fluoxetine to 40 mg a day from 60 mg a day.  Check vitamin D level again as soon as convenient to see if it has gotten to the 50's as it's a risk factor for seasonal depression and cognitive problems and pt is at risk of malabsorption.  Discussed potential metabolic side effects associated with atypical antipsychotics, as well as potential risk for movement side effects. Advised pt to contact office if movement side effects occur.  ADD is adequately controlled with Focalin.  Disc SE meds and this is heightened by the complication of necessary polypharmacy.  This appointment was 35 minutes.  I connected with patient by a video enabled telemedicine application or telephone, with their informed consent, and verified patient privacy and that I am speaking with the correct person using two identifiers.  I was located at office and patient at home.  FU 3 weeks.  Lynder Parents MD, DFAPA  Please see After Visit Summary for patient specific instructions.  Future Appointments  Date Time Provider [place]  12/17/2018 12:00 PM Shanon Ace, LCSW [facility] None    No orders of the defined types were placed in this encounter.      -------------------------------

## 2018-12-17 ENCOUNTER — Other Ambulatory Visit: Payer: Self-pay

## 2018-12-17 ENCOUNTER — Ambulatory Visit (INDEPENDENT_AMBULATORY_CARE_PROVIDER_SITE_OTHER): Payer: BLUE CROSS/BLUE SHIELD | Admitting: Psychiatry

## 2018-12-17 DIAGNOSIS — F319 Bipolar disorder, unspecified: Secondary | ICD-10-CM | POA: Diagnosis not present
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
## 2018-12-20 ENCOUNTER — Other Ambulatory Visit: Payer: Self-pay | Admitting: Psychiatry

## 2018-12-27 ENCOUNTER — Other Ambulatory Visit: Payer: Self-pay

## 2018-12-27 ENCOUNTER — Ambulatory Visit (INDEPENDENT_AMBULATORY_CARE_PROVIDER_SITE_OTHER): Payer: BLUE CROSS/BLUE SHIELD | Admitting: Psychiatry

## 2018-12-27 ENCOUNTER — Encounter: Payer: Self-pay | Admitting: Psychiatry

## 2018-12-27 DIAGNOSIS — R296 Repeated falls: Secondary | ICD-10-CM

## 2018-12-27 DIAGNOSIS — G3184 Mild cognitive impairment, so stated: Secondary | ICD-10-CM

## 2018-12-27 DIAGNOSIS — F319 Bipolar disorder, unspecified: Secondary | ICD-10-CM

## 2018-12-27 DIAGNOSIS — G251 Drug-induced tremor: Secondary | ICD-10-CM

## 2018-12-27 DIAGNOSIS — F411 Generalized anxiety disorder: Secondary | ICD-10-CM | POA: Diagnosis not present

## 2018-12-27 DIAGNOSIS — F900 Attention-deficit hyperactivity disorder, predominantly inattentive type: Secondary | ICD-10-CM

## 2018-12-27 DIAGNOSIS — R7989 Other specified abnormal findings of blood chemistry: Secondary | ICD-10-CM

## 2018-12-27 MED ORDER — DEXMETHYLPHENIDATE HCL ER 25 MG PO CP24: 25.0000 mg | ORAL_CAPSULE | ORAL | 0 refills | Status: DC

## 2018-12-27 MED ORDER — DEXMETHYLPHENIDATE HCL ER 25 MG PO CP24: 25.0000 mg | ORAL_CAPSULE | Freq: Every day | ORAL | 0 refills | Status: DC

## 2018-12-27 MED ORDER — DEXMETHYLPHENIDATE HCL ER 20 MG PO CP24: 20.0000 mg | ORAL_CAPSULE | Freq: Every day | ORAL | 0 refills | Status: DC

## 2018-12-27 MED ORDER — FLUOXETINE HCL 20 MG PO CAPS: 60.0000 mg | ORAL_CAPSULE | Freq: Every day | ORAL | 1 refills | Status: DC

## 2018-12-27 MED ORDER — DEXMETHYLPHENIDATE HCL 10 MG PO TABS: 10.0000 mg | ORAL_TABLET | Freq: Two times a day (BID) | ORAL | 0 refills | Status: DC

## 2018-12-27 NOTE — Progress Notes (Signed)
Megan Whitney [id number] [birth year] 63 y.o.  Subjective:   Patient ID:  Megan Whitney is a 63 y.o. (DOB 1956/01/22) female.  Virtual Visit via Telephone Note  I connected with pt by telephone and verified that I am speaking with the correct person using two identifiers.   I discussed the limitations, risks, security and privacy concerns of performing an evaluation and management service by telephone and the availability of in person appointments. I also discussed with the patient that there may be a patient responsible charge related to this service. The patient expressed understanding and agreed to proceed.  I discussed the assessment and treatment plan with the patient. The patient was provided an opportunity to ask questions and all were answered. The patient agreed with the plan and demonstrated an understanding of the instructions.   The patient was advised to call back or seek an in-person evaluation if the symptoms worsen or if the condition fails to improve as anticipated.  I provided 40 minutes of non-face-to-face time during this encounter. The call started at 1135 and ended at 1215. The patient was located at home and the provider was located at office.   Chief Complaint:  Chief Complaint  Patient presents with  . Follow-up    Medication Management  . Anxiety    Increasing  . Depression    Some depression  . Medication Refill    Focalin 20 Mg XR    Anxiety  Symptoms include nervous/anxious behavior. Patient reports no confusion, decreased concentration, dizziness or suicidal ideas.    Depression         Associated symptoms include no decreased concentration and no suicidal ideas.  Past medical history includes anxiety.   Medication Refill  Associated symptoms include arthralgias. Pertinent negatives include no weakness.   Megan Whitney is contacted for follow-up of recent problems with falling and chronic mood swings and anxiety and changes in medications  made at the last visit.  At the last visit March 31 because of concerns about falling and possible early tardive dyskinesia mouth movements we reduced the Vraylar from 4.5 to 3 mg daily.  Also because of concerns about a pattern of rapid cycling which had been present we reduced the fluoxetine from 60 mg to 40 mg daily.  Prior to the last visit she has called multiple times complaining of falls.  Some other physicians had suggested that perhaps her psych meds were contributing because there were no other obvious causes.  She is not yet had a neurologic evaluation apparently.  4 falls and 2 ER visits and last broken pelvis. Feburary 15. Face and mouth contort and toes constantly moving. Severe tremor bilateral. Shuffle feet both.  Poor balance but still drools.  Prior to the last visit reduced Equetro 200 and No differences noticed in falling frequents.  A little depression and anxiety.  1 anger issue.  Still experiencing most of the sx of toe movement, grimacing for a long time mainly since the increase in Vraylar, and tremor, drooling and shuffling but it is better than last visit.Marland Kitchen  H notices.  No further falls.  Started walking in the morning and evening.  Had more energy on the higher levels of the meds than now.  Pt reports that mood is Anxious and Depressed .  Depression not deep.  and describes anxiety as Severe at times.  Found out their might be furloughs at work. Anxiety symptoms include: Excessive Worry, Panic Symptoms,. Pt reports has interrupted sleep and irregular.  Pt reports that appetite is good. Pt reports that energy is down significantly and loss of interest or pleasure in usual activities, poor motivation and withdrawn from usual activities. Concentration is worse much lately. Suicidal thoughts:  denied by patient.  Less knitting about 3 weeks ago.  No mood swings.  After the last visit serum vitamin D level was determined to be low at 33.  The goal and chronically depressed  patient's is in the 50s if possible.  So her vitamin D was increased on December 4 or thereabouts.    Going to bed on time but dog gets her up to go out and hard to get back to sleep.  Avg 7-8 hours.  No hyperactivity.  ? Impulsive except anger.  has noticed more tremor in her hands and worse in the morning. Tremor is noticeable and embarrassing but doesn't want to stop the Vraylar DT the benefit for her mood. Tremor not real bad and  Propranolol didn't seem as helpful.  Tremoir worse under stress.  Past Psychiatric Medication Trials: Vraylar, lithium 150, Equetro, Lamictal 200 twice daily, Latuda 80, Focalin, fluoxetine 60, Trileptal 450, olanzapine, Seroquel, Depakote, risperidone, buspirone, Abilify, ropinirole, amantadine, Sinemet, Ritalin, Artane, Cogentin, trazodone hangover, Ambien hangover, sertraline 100, Wellbutrin history of facial tics, paroxetine cognitive side effects Review of Systems:  Review of Systems  Musculoskeletal: Positive for arthralgias and gait problem.  Neurological: Positive for tremors. Negative for dizziness and weakness.       Others see HPI  Psychiatric/Behavioral: Positive for depression. Negative for agitation, behavioral problems, confusion, decreased concentration, dysphoric mood, hallucinations, self-injury, sleep disturbance and suicidal ideas. The patient is nervous/anxious. The patient is not hyperactive.     Medications: I have reviewed the patient's current medications.  Current Outpatient Medications  Medication Sig Dispense Refill  . atorvastatin (LIPITOR) 10 MG tablet Take 10 mg by mouth daily.    . carbamazepine (EQUETRO) 200 MG CP12 12 hr capsule Take 1 capsule (200 mg total) by mouth at bedtime. Stop the Equetro 300 mg capsule 60 each 0  . cariprazine (VRAYLAR) capsule Take 1 capsule (3 mg total) by mouth daily. 30 capsule 1  . cetirizine (ZYRTEC) 10 MG tablet Take 10 mg by mouth daily.    . Cholecalciferol 1.25 MG (50000 UT) capsule Take 1  capsule (50,000 Units total) by mouth 3 (three) times a week. 12 capsule 5  . dexmethylphenidate (FOCALIN) 10 MG tablet Take 1 tablet (10 mg total) by mouth 2 (two) times daily. 30 tablet 0  . famotidine (PEPCID) 20 MG tablet Take 20 mg by mouth daily.    . ferrous gluconate (FERGON) 324 MG tablet Take 324 mg by mouth daily with breakfast.    . FLUoxetine (PROZAC) 20 MG capsule Take 3 capsules (60 mg total) by mouth daily. 90 capsule 1  . lamoTRIgine (LAMICTAL) 200 MG tablet Take 1 tablet (200 mg total) by mouth 2 (two) times daily. 60 tablet 2  . lithium carbonate 150 MG capsule TAKE 1 CAPSULE(150 MG) BY MOUTH DAILY 90 capsule 1  . Multiple Vitamin (MULTIVITAMIN) tablet Take 1 tablet by mouth daily.    . pantoprazole (PROTONIX) 40 MG tablet Take 40 mg by mouth daily.    Derrill Memo ON 01/24/2019] dexmethylphenidate (FOCALIN) 10 MG tablet Take 1 tablet (10 mg total) by mouth 2 (two) times daily. 60 tablet 0  . [START ON 01/24/2019] Dexmethylphenidate HCl 25 MG CP24 Take 25 mg by mouth daily. 30 capsule 0  . Dexmethylphenidate HCl 25  MG CP24 Take 25 mg by mouth every morning. 30 capsule 0   No current facility-administered medications for this visit.     Medication Side Effects: Other: tremor and weight gain.  dyskinesia  Allergies:  Allergies  Allergen Reactions  . Azithromycin Anaphylaxis  . Penicillins Anaphylaxis    DID THE REACTION INVOLVE: Swelling of the face/tongue/throat, SOB, or low BP? Yes Sudden or severe rash/hives, skin peeling, or the inside of the mouth or nose? Yes Did it require medical treatment? No When did it last happen? If all above answers are "NO", may proceed with cephalosporin use.  . Adhesive [Tape]     On bandaids    Past Medical History:  Diagnosis Date  . Anemia    History of GI blood loss  . Anxiety   . Arthritis   . Bipolar 1 disorder ([place])   . Colon polyps   . Depression   . Epistaxis    Around 2011 or 2012, required cauterization.   .  Esophageal stricture   . GERD (gastroesophageal reflux disease)   . Headache(784.0)   . Hyperlipidemia   . Interstitial cystitis   . Lung cancer ([place]) 2002  . Obesity   . Sleep apnea    Doesn't use a CPAP    Family History  Problem Relation Age of Onset  . Arthritis Mother   . Hypertension Father   . Diabetes Mellitus II Father   . Heart disease Father   . Colon cancer Neg Hx   . Esophageal cancer Neg Hx   . Rectal cancer Neg Hx     Social History   Socioeconomic History  . Marital status: Married    Spouse name: Not on file  . Number of children: 1  . Years of education: Not on file  . Highest education level: Not on file  Occupational History  . Occupation: Admin. assistant  Social Needs  . Financial resource strain: Not on file  . Food insecurity:    Worry: Not on file    Inability: Not on file  . Transportation needs:    Medical: Not on file    Non-medical: Not on file  Tobacco Use  . Smoking status: Never Smoker  . Smokeless tobacco: Never Used  Substance and Sexual Activity  . Alcohol use: Yes    Alcohol/week: 1.0 standard drinks    Types: 1 Glasses of wine per week    Comment: Moderate  . Drug use: No  . Sexual activity: Not on file  Lifestyle  . Physical activity:    Days per week: Not on file    Minutes per session: Not on file  . Stress: Not on file  Relationships  . Social connections:    Talks on phone: Not on file    Gets together: Not on file    Attends religious service: Not on file    Active member of club or organization: Not on file    Attends meetings of clubs or organizations: Not on file    Relationship status: Not on file  . Intimate partner violence:    Fear of current or ex partner: Not on file    Emotionally abused: Not on file    Physically abused: Not on file    Forced sexual activity: Not on file  Other Topics Concern  . Not on file  Social History Narrative  . Not on file    Past Medical History, Surgical history,  Social history, and Family history were reviewed  and updated as appropriate.   Please see review of systems for further details on the patient's review from today.   Objective:   Physical Exam:  There were no vitals taken for this visit.  Physical Exam Neurological:     Mental Status: She is alert and oriented to person, place, and time.     Cranial Nerves: No dysarthria.  Psychiatric:        Attention and Perception: Attention normal. She does not perceive auditory or visual hallucinations.        Mood and Affect: Mood is anxious. Mood is not depressed. Affect is not angry, tearful or inappropriate.        Speech: Speech normal. Speech is not rapid and pressured or slurred.        Behavior: Behavior is cooperative.        Thought Content: Thought content normal. Thought content is not paranoid or delusional. Thought content does not include homicidal or suicidal ideation. Thought content does not include homicidal or suicidal plan.        Cognition and Memory: She exhibits impaired recent memory.        Judgment: Judgment normal.     Comments: There was no dysarthria evident in the phone conversation.     Lab Review:     Component Value Date/Time   NA 136 10/21/2018 0858   K 4.0 10/21/2018 0858   CL 106 10/21/2018 0858   CO2 24 10/21/2018 0858   GLUCOSE 103 (H) 10/21/2018 0858   BUN 22 10/21/2018 0858   CREATININE 0.75 10/21/2018 0858   CALCIUM 8.8 (L) 10/21/2018 0858   PROT 6.6 10/21/2018 0858   ALBUMIN 3.6 10/21/2018 0858   AST 22 10/21/2018 0858   ALT 16 10/21/2018 0858   ALKPHOS 58 10/21/2018 0858   BILITOT 0.5 10/21/2018 0858   GFRNONAA >60 10/21/2018 0858   GFRAA >60 10/21/2018 0858       Component Value Date/Time   WBC 8.2 10/21/2018 0858   RBC 4.18 10/21/2018 0858   HGB 12.2 10/21/2018 0858   HCT 38.7 10/21/2018 0858   PLT 211 10/21/2018 0858   MCV 92.6 10/21/2018 0858   MCH 29.2 10/21/2018 0858   MCHC 31.5 10/21/2018 0858   RDW 13.9 10/21/2018 0858    LYMPHSABS 1.9 10/21/2018 0858   MONOABS 0.9 10/21/2018 0858   EOSABS 0.7 (H) 10/21/2018 0858   BASOSABS 0.0 10/21/2018 0858  Vitamin D level 33 on 10K units daily on 12/4/`9 Increased to prescription vitamin d 50K units Monday, Wed, Friday.  Rx sent in.   Lithium Lvl  Date Value Ref Range Status  10/21/2018 0.18 (L) 0.60 - 1.20 mmol/L Final    Comment:    Performed at [facility], [address], [place], [place] [address]     No results found for: PHENYTOIN, PHENOBARB, VALPROATE, CBMZ   .res Assessment: Plan:    Bipolar I disorder with rapid cycling ([place])  Falling  Generalized anxiety disorder  Attention deficit hyperactivity disorder (ADHD), predominantly inattentive type - Plan: dexmethylphenidate (FOCALIN) 10 MG tablet, dexmethylphenidate (FOCALIN) 10 MG tablet, Dexmethylphenidate HCl 25 MG CP24, Dexmethylphenidate HCl 25 MG CP24, DISCONTINUED: dexmethylphenidate (FOCALIN XR) 20 MG 24 hr capsule, DISCONTINUED: dexmethylphenidate (FOCALIN XR) 20 MG 24 hr capsule  Mild cognitive impairment  Tremor due to multiple drugs  Low vitamin D level  Chardae has chronic rapid cycling bipolar disorder which is chronically unstable and has been difficult to control.  In September her fluoxetine was increased to  60 mg a day due to tearfulness.  Her mood is fairly stable at the moment.  The reduction in Equetro did not help the balance issues as best we can tell nor did it worsen mood so far.  Very concerned about her description of contorting her mouth this could be the development might of tardive dyskinesia.  Disc this possibility.   However she also appears to be having some parkinsonism as she describes shuffling gait and drooling.  In either case we need more time on the lower dose of  the Vraylar in hopes of improving the symptoms.  Arman Filter has a long half life.  Discussed tardive dyskinesia.  Discussed the potential for neurologic evaluation.  However that would best occur after her  Vraylar blood level has been reduced.  Continue Vraylar  3 mg daily.   Options for depression and less focus increasing fluoxetine back or increasing the Focalin.  She prefers increasing the Focalin.  Discussed the pros and cons of each.  Specifically increasing the fluoxetine may cause mood swings but the decrease in focus happened after the reduction in fluoxetine also the decrease in interest happen after the reduction of fluoxetine.  On the other hand increasing Focalin will provide a quicker response and may be less likely to cause mood swings though that still possible. Will increase Focalin XR to 25 mg from 20 mg daily. We will continue fluoxetine 40 mg daily.  Discussed side effects of each medicine.  Check vitamin D level again as soon as convenient to see if it has gotten to the 50's as it's a risk factor for seasonal depression and cognitive problems and pt is at risk of malabsorption.  Discussed potential metabolic side effects associated with atypical antipsychotics, as well as potential risk for movement side effects. Advised pt to contact office if movement side effects occur.  Disc SE meds and this is heightened by the complication of necessary polypharmacy.  This appointment was 35 minutes.  FU 3 weeks.  Lynder Parents MD, DFAPA  Please see After Visit Summary for patient specific instructions.  Future Appointments  Date Time Provider [place]  01/03/2019 11:00 AM Shanon Ace, LCSW [facility] None    No orders of the defined types were placed in this encounter.     -------------------------------

## 2018-12-28 ENCOUNTER — Other Ambulatory Visit: Payer: Self-pay | Admitting: Psychiatry

## 2019-01-01 ENCOUNTER — Other Ambulatory Visit: Payer: Self-pay

## 2019-01-01 MED ORDER — CHOLECALCIFEROL 1.25 MG (50000 UT) PO CAPS: 50000.0000 [IU] | ORAL_CAPSULE | ORAL | 5 refills | Status: DC

## 2019-01-03 ENCOUNTER — Ambulatory Visit (INDEPENDENT_AMBULATORY_CARE_PROVIDER_SITE_OTHER): Payer: BLUE CROSS/BLUE SHIELD | Admitting: Psychiatry

## 2019-01-03 ENCOUNTER — Other Ambulatory Visit: Payer: Self-pay

## 2019-01-03 DIAGNOSIS — F319 Bipolar disorder, unspecified: Secondary | ICD-10-CM | POA: Diagnosis not present

## 2019-01-03 NOTE — Progress Notes (Signed)
[facility] Counselor/Therapist Progress Note  Patient ID: Megan Whitney, MRN: [id number],    Date: 01/03/2019  Time Spent: 60 minutes   11:00am to 12:00 noon  Treatment Type: Individual Therapy   Virtual Visit Note: I connected with patient by a video enabled telemedicine application or telephone, with their informed consent, and verified patient privacy and that I am speaking with the correct person using two identifiers. I am at [place] and patient is at home.   I discussed the limitations, risks, security and privacy concerns of performing psychotherapy and management service by telephone and the availability of in person appointments. I also discussed with the patient that there may be a patient responsible charge related to this service. The patient expressed understanding and agreed to proceed.  I discussed the treatment planning with the patient. The patient was provided an opportunity to ask questions and all were answered. The patient agreed with the plan and demonstrated an understanding of the instructions.   The patient was advised to call  our office if  symptoms worsen or feel they are in a crisis state and need immediate contact.   Reported Symptoms:  Lethargic, low motivation, loss of interest in some of her hobbies, depression, anxiety, sadness at times (general vs specific and she feels it's related to her depression), has had some recent SI but not current today  Mental Status Exam:  Appearance:    n/a  Behavior:  Sharing  Motor:  Normal  Speech/Language:   Normal Rate  Affect:  n/a  Mood:  angry, anxious and depressed  Thought process:  normal  Thought content:    WNL  Sensory/Perceptual disturbances:    WNL  Orientation:  oriented to person, place, time/date, situation, day of week, month of year and year  Attention:  Good  Concentration:  Good  Memory:  WNL  Fund of knowledge:   Good  Insight:    Fair  Judgment:   Fair  Impulse  Control:  Fair   Risk Assessment: Danger to Self:  No Self-injurious Behavior: No Danger to Others: No Duty to Warn:no Physical Aggression / Violence:No  Access to Firearms a concern: No  Gang Involvement:No   Subjective:  Patient reports symptoms noted above.  States she did have a return of some SI about 3 days ago after hearing of potential cutbacks in jobs at work. Also states she is not having suicidal thoughts today. Does commit to contacting our office or going for eval if SI thoughts return and she feels she is losing control. Explained to her how to access after hours eval if needed through [facility] and they can evaluate for admission to [facility].  Possible cutbacks at work are very uncertain and patient trying to stay out of "worry mode" and they anticipate knowing more in early June."  Husband's drinking continues to impact patient heavily, as she becomes more frustrated, angry, worried, and anxious. Patient states she tries to get him to decrease his drinking but that just makes more trouble in their relationship and increases his anger.  Over 30 yrs ago, husband (per his report) attended an [place] meeting. Husband told patient that he was told "he did not have a problem since he only drank beer and not liquor."  Patient "feels helpless to get husband to go for treatment but stays with him because I'm afraid and can't support myself financially without his income.  Patient talked about these issues at length and  then shared some  info about communication she has had with a longtime girlfriend. Patient trying to "make the best of it" for now.  Encouraged patient to stay involved in her friends' group Zoom meetup on Tuesday nights, taking a walk daily (even a short one as her stability has been better), keep in touch with other friends by phone or text, work on painting, read, devotions, and practicing better self-care overall, play with her dog, and deep-breathing exercises  which she reports has helped more at night.    Denies any current SI.   Interventions: Solution-Focused and CBT  Diagnosis:   ICD-10-CM   1. Bipolar I disorder with rapid cycling ([place]) F31.9     Plan:   Patient to follow through with plans mentioned above.  Next appt will be within 2  wks for continued goal-directed treatment.  Shanon Ace, LCSW

## 2019-01-08 ENCOUNTER — Other Ambulatory Visit: Payer: Self-pay | Admitting: Psychiatry

## 2019-01-17 ENCOUNTER — Ambulatory Visit: Payer: BLUE CROSS/BLUE SHIELD | Admitting: Psychiatry

## 2019-01-17 ENCOUNTER — Other Ambulatory Visit: Payer: Self-pay

## 2019-01-17 DIAGNOSIS — F319 Bipolar disorder, unspecified: Secondary | ICD-10-CM

## 2019-01-17 NOTE — Progress Notes (Signed)
[facility] Counselor/Therapist Progress Note  Patient ID: Megan Whitney, MRN: [id number],    Date: 01/17/2019  Time Spent: 60 minutes   1:00pm to 2:00pm  Treatment Type: Individual Therapy   Virtual Visit Note: I connected with patient by a video enabled telemedicine application or telephone, with their informed consent, and verified patient privacy and that I am speaking with the correct person using two identifiers. I am at [place] and patient is at home. I discussed the limitations, risks, security and privacy concerns of performing psychotherapy and management service by telephone and the availability of in person appointments. I also discussed with the patient that there may be a patient responsible charge related to this service. The patient expressed understanding and agreed to proceed. I discussed the treatment planning with the patient. The patient was provided an opportunity to ask questions and all were answered. The patient agreed with the plan and demonstrated an understanding of the instructions.  The patient was advised to call  our office if  symptoms worsen or feel they are in a crisis state and need immediate contact.   Reported Symptoms:  Depression (decreased), anxiety, sadness at times (general vs specific and she feels it's related to her depression "but not as heavy"), No SI.  States "Summer is always my best time emotionally."  Mental Status Exam:  Appearance:    n/a    (teleheath)  Behavior:  Sharing  Motor:  N/A    (telehealth)  Speech/Language:   Normal Rate  Affect:  N/A   (telehealth)  Mood:  angry, anxious and depressed  Thought process:  normal  Thought content:    WNL  Sensory/Perceptual disturbances:    WNL  Orientation:  oriented to person, place, time/date, situation, day of week, month of year and year  Attention:  Good  Concentration:  Good  Memory:  WNL  Fund of knowledge:   Good  Insight:    Fair  Judgment:   Fair   Impulse Control:  Fair   Risk Assessment: Danger to Self:  No Self-injurious Behavior: No Danger to Others: No Duty to Warn:no Physical Aggression / Violence:No  Access to Firearms a concern: No  Gang Involvement:No   Subjective:  Patient reports symptoms of depression have decreased and she has had no SI in the past week, "not even mild suicidal feelings".  No more falls which is good for patient and she is not sensing any instability at all in that area. Still having some anxiety that she describes as "generalized" but often related to working-at-home stress or stress with husband. Volume of work-at-home has increased but she says it will decrease by Wednesday of next week.  Is planning to take a day off the day after [facility].    Husband's drinking continues to remain about the same and does impact patient as she becomes more frustrated, angry, worried, and anxious. Over 30 yrs ago, husband (per his report) attended an [place] meeting. Husband told patient that he was told "he did not have a problem since he only drank beer and not liquor."  Patient "feels helpless to get husband to go for treatment but stays with him because I'm afraid and can't support myself financially without his income.    Self-Care: One thing she is doing differently is when husband starts drinking, she removes herself to go to another area of home and works on her painting or other crafts. These hobbies take concentration which "helps me not  concentrate on husband."  Patient is also walking 2 to 2 1/2 miles per day and she began this "when I joined [employer] app."  Has lost about 10lbs in 5 weeks and is learning a lot about "my relationship with food, how to not be punitive with self when she makes mistakes, and has given up her nighttime binges."   ** Husband has joined her in using the [employer] program, has progressed also, and is supportive of patient. Patient also reports that recently played a board game together and both  really enjoyed it.    Another positive that has happened is that patient's stepson and his wife have history of being very "distant" from patient and husband however recently they came and visited patient on Mother's Day, and she feels some reconciliation is being made in their relationship. Still keeping in touch with other friends by phone or text, work on painting, read, devotions, and practicing better self-care overall, play with her dog, and deep-breathing exercises which she reports has helped more at night. *Also better relationship with husband recently as noted above and she plans to encourage that moving forward.     Denies any current SI.  Interventions: Solution-Focused and CBT  Diagnosis:   ICD-10-CM   1. Bipolar I disorder with rapid cycling ([place]) F31.9     Plan:   Patient to follow through with plans mentioned above.  Next appt will be within 2  wks for continued goal-directed treatment.  Shanon Ace, LCSW

## 2019-01-25 ENCOUNTER — Telehealth: Payer: Self-pay | Admitting: Psychiatry

## 2019-01-25 NOTE — Telephone Encounter (Signed)
Patient need refill for Dexmethondate 20 mg., ER capsule to be sent to [facility] on [address] in [place]

## 2019-01-25 NOTE — Telephone Encounter (Signed)
This was submitted yesterday by Dr. Clovis Pu

## 2019-02-05 ENCOUNTER — Other Ambulatory Visit: Payer: Self-pay | Admitting: Psychiatry

## 2019-02-07 ENCOUNTER — Ambulatory Visit: Payer: BC Managed Care – PPO | Admitting: Psychiatry

## 2019-02-07 ENCOUNTER — Other Ambulatory Visit: Payer: Self-pay

## 2019-02-07 DIAGNOSIS — F319 Bipolar disorder, unspecified: Secondary | ICD-10-CM | POA: Diagnosis not present

## 2019-02-07 NOTE — Progress Notes (Signed)
[facility] Counselor/Therapist Progress Note  Patient ID: Megan Whitney, MRN: [id number],    Date: 02/07/2019  Time Spent: 60 minutes   8:00am to 9:00am  Treatment Type: Individual Therapy   Reported Symptoms:   anxiety, sadness at times (general vs specific) No SI.  States "Summer is always my best time emotionally."  Mental Status Exam:  Appearance:   casual  Behavior:  Sharing  Motor:  normal  Speech/Language:  Normal Rate  Affect:  anxiuos  Mood:  Anxious, some depressed mood  Thought process:  normal  Thought content:    WNL  Sensory/Perceptual disturbances:    WNL  Orientation:  oriented to person, place, time/date, situation, day of week, month of year and year  Attention:  Good  Concentration:  Good  Memory:  WNL  Fund of knowledge:   Good  Insight:    Fair  Judgment:   Fair  Impulse Control:  Fair   Risk Assessment: Danger to Self:  No Self-injurious Behavior: No Danger to Others: No Duty to Warn:no Physical Aggression / Violence:No  Access to Firearms a concern: No  Gang Involvement:No   Subjective:  Patient today is in office reporting she has had more depression this past couple weeks.  "Not sure what caused it, I just found myself struggling."  Patient is concerned about her job and if they'll have any cutbacks, although they've not said that.  Also expresses fears and apprehension about "the current racial unrest and potential for violence, and the pandemic."  No current SI.  Did say that she had some "fleeting thoughts 1 or 2 days last week when I was experiencing more depressed feelings, brainfog, and hard to do my work for those 2 days."  After those 2 days "I have been better."  Feels that when "I take better care of herself, she doesn't usually feel depressed."  Has continued to participate in the noom program and has lost 13 pounds in 7 lbs.  Has been able to to develop some healthier eating and exercise patterns that are helping her a lot,  including reduction of nighttime binges.  Still having some anxiety that she describes as "generalized" but often related to working-at-home stress or stress with husband, whose drinking remain about the same. Volume of work-at-home has decreased now. Discussed strategies, especially CBT, to help her manage her symptoms more positively.  Does find that painting, going outside in her yard, and meditation helps in calming her.  Admits she is still hard on her herself and is working on trying to manage that better without being punitive towards herself.  Self-Care: One thing she is continuing to do differently is when husband starts drinking, she removes herself to go to another area of home and works on her painting or other crafts. These hobbies take concentration which helps me not concentrate on husband."  Still keeping in touch with other friends by phone or text, work on painting, read, occasional devotions, playing with her dog, and deep-breathing exercises which she reports has helped more at night.    Denies any current SI.  Interventions: Solution-Focused and CBT  Diagnosis:   ICD-10-CM   1. Bipolar I disorder with rapid cycling ([place]) F31.9     Plan:   Patient to follow through with with strategies discussed today to help with her depression and anxiety.  Patient feels she needs to return sooner this time and  next appt will be within 2  wks for continued goal-directed  treatment.  Shanon Ace, LCSW

## 2019-02-12 DIAGNOSIS — Z85118 Personal history of other malignant neoplasm of bronchus and lung: Secondary | ICD-10-CM | POA: Diagnosis not present

## 2019-02-13 ENCOUNTER — Other Ambulatory Visit: Payer: Self-pay

## 2019-02-13 ENCOUNTER — Ambulatory Visit (INDEPENDENT_AMBULATORY_CARE_PROVIDER_SITE_OTHER): Payer: BC Managed Care – PPO | Admitting: Psychiatry

## 2019-02-13 ENCOUNTER — Encounter: Payer: Self-pay | Admitting: Psychiatry

## 2019-02-13 DIAGNOSIS — F319 Bipolar disorder, unspecified: Secondary | ICD-10-CM

## 2019-02-13 DIAGNOSIS — F411 Generalized anxiety disorder: Secondary | ICD-10-CM | POA: Diagnosis not present

## 2019-02-13 DIAGNOSIS — F900 Attention-deficit hyperactivity disorder, predominantly inattentive type: Secondary | ICD-10-CM | POA: Diagnosis not present

## 2019-02-13 DIAGNOSIS — G251 Drug-induced tremor: Secondary | ICD-10-CM | POA: Diagnosis not present

## 2019-02-13 DIAGNOSIS — R296 Repeated falls: Secondary | ICD-10-CM

## 2019-02-13 DIAGNOSIS — G3184 Mild cognitive impairment, so stated: Secondary | ICD-10-CM
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
## 2019-02-20 ENCOUNTER — Telehealth: Payer: Self-pay | Admitting: Psychiatry

## 2019-02-20 NOTE — Telephone Encounter (Signed)
Patient still struggling with depression she stated the prozac was decreased and she would like to know if it can be increased or if you had any other suggestions

## 2019-02-22 ENCOUNTER — Ambulatory Visit (INDEPENDENT_AMBULATORY_CARE_PROVIDER_SITE_OTHER): Payer: BC Managed Care – PPO | Admitting: Psychiatry

## 2019-02-22 ENCOUNTER — Telehealth: Payer: Self-pay | Admitting: Psychiatry

## 2019-02-22 ENCOUNTER — Other Ambulatory Visit: Payer: Self-pay

## 2019-02-22 DIAGNOSIS — F319 Bipolar disorder, unspecified: Secondary | ICD-10-CM

## 2019-02-22 NOTE — Telephone Encounter (Signed)
I just recently saw her on June 10 and reduced the fluoxetine to 20 mg to help reduce the problem with rapid cycling which she is to add over a long period of time.  She clearly has not had time to adjust to this change in medications.  I advised against increasing the Prozac back up because it is just can contribute to more rapid cycling.  I believe that she if she left the dosage at this level within the next couple of weeks it would even out.  If she feels a sense intolerable and she cannot wait then she can increase the fluoxetine back to 40 mg daily

## 2019-02-22 NOTE — Telephone Encounter (Signed)
See previous message

## 2019-02-22 NOTE — Progress Notes (Signed)
[facility] Counselor/Therapist Progress Note  Patient ID: Megan Whitney, MRN: [id number],    Date: 02/22/2019  Time Spent: 60 minutes   10:00am To 11:00am  Treatment Type: Individual Therapy   Reported Symptoms:   anxiety, depressed, agitation, "quick anger", impatient, sadness at times (general vs specific) No suicidal ideation right now but did earlier this a.m.  Mental Status Exam:  Appearance:   casual  Behavior:  Sharing  Motor:  normal  Speech/Language:  Normal Rate  Affect:  Anxious and states I am spiraling,   Mood:  Anxious, some depressed mood  Thought process:  normal  Thought content:    WNL  Sensory/Perceptual disturbances:    WNL  Orientation:  oriented to person, place, time/date, situation, day of week, month of year and year  Attention:  Good  Concentration:  Good  Memory:  WNL  Fund of knowledge:   Good  Insight:    Fair  Judgment:   Fair  Impulse Control:  Fair   Risk Assessment: Danger to Self:  Denies current SI but has had SI earlier this a.m.  Did explain process for being evaluated for inpatient care if needed. Self-injurious Behavior: No Danger to Others: No Duty to Warn:no Physical Aggression / Violence:No  Access to Firearms a concern: No  Gang Involvement:No   Subjective:  Patient today is in office noting symptoms above and stating "I think I'm spiraling, I'm not doing well".   Has left a message for Dr. Clovis Pu re: meds.  Is irritable, impatient, anxious, depressed, and intermittent sadness.  Denies current SI, and adds that she did have SI earlier this a.m. States that the deep breathing exercises we've reviewed in sessions helped her calm down. States she has been diligently taking her meds except last night, "I was just too tired so climbed into bed with my day clothes on and didn't take meds."  Discussed the importance of this with patient.  Reports decreased motivation at work, with hobbies, etc.  Has continued to do well with  Noom program except for last night, but reports she has done well with it this a.m. despite emotionally struggling more. States she "has no idea as to anything setting me off and feeling like I'm going to spiral."  "My temper is more short, the pandemic is getting to me more again, some fears with the racial tensions."  Increased anxiety.  Is to take vacation the week of June 29th but is not going away. Shares that when she came into lobby today, she was a "10" and after venting and talking things through more today, I am a "6", but has also felt moods lately have been very volatile.  Shortly after saying this, she began crying again and stated she felt at times she was a hopeless case.  Discussed this and how patient may "feel that way right now" but it is not an accurate reflection of her situation.  She has made some progress more recently, but currently is having a more difficult time.  Reviewed the self-care strategies, including grounding, that she needs to be doing for herself right now, including staying on meds til she hears otherwise from prescribing doctor. Has "intermittent suicidal thoughts but no plans" but "right now I'm not having any SI". States she does not want to be in the hospital, but would go for eval if needed due to SI.  Did explain to her if needed at any time, the process to be evaluated for  inpatient care.   (reviewed today and suggested she follow through on some of these activities as they help her also to feel more grounded.) Self-Care: One thing she is continuing to do differently is when husband starts drinking, she removes herself to go to another area of home and works on her painting or other crafts. These hobbies take concentration which helps me not concentrate on husband."  Still keeping in touch with other friends by phone or text, work on painting, read, occasional devotions, playing with her dog, and deep-breathing exercises which she reports has helped more at night.     Denies any current SI upon leaving today.    Interventions: Solution-Focused and CBT  Diagnosis:   ICD-10-CM   1. Bipolar I disorder with rapid cycling ([place])  F31.9     Plan:  Patient to follow through with with strategies discussed today to help with her anger, depression and anxiety, and patient made a list to have with her. Next appt will be within 1-2wks for continued goal-directed treatment.   Shanon Ace, LCSW

## 2019-02-22 NOTE — Telephone Encounter (Signed)
Pt was in office today for LPC Appt. Stated she had left message earlier this week. Never heard from anyone. She's not doing good at all. Started getting upset ask she was talking. Please advise

## 2019-02-26 ENCOUNTER — Other Ambulatory Visit: Payer: Self-pay

## 2019-02-26 ENCOUNTER — Telehealth: Payer: Self-pay | Admitting: Psychiatry

## 2019-02-26 DIAGNOSIS — F900 Attention-deficit hyperactivity disorder, predominantly inattentive type: Secondary | ICD-10-CM

## 2019-02-26 MED ORDER — DEXMETHYLPHENIDATE HCL ER 25 MG PO CP24: 25.0000 mg | ORAL_CAPSULE | ORAL | 0 refills | Status: DC

## 2019-02-26 MED ORDER — DEXMETHYLPHENIDATE HCL ER 25 MG PO CP24: ORAL_CAPSULE | ORAL | 0 refills | Status: DC

## 2019-02-26 NOTE — Telephone Encounter (Signed)
Refill pended

## 2019-02-26 NOTE — Telephone Encounter (Signed)
Megan Whitney called to request a refill of her focalin ER 25 mg.  Next appt 04/15/19.  Send to pharmacy on file.

## 2019-03-04 ENCOUNTER — Ambulatory Visit: Payer: BC Managed Care – PPO | Admitting: Psychiatry

## 2019-03-04 ENCOUNTER — Other Ambulatory Visit: Payer: Self-pay

## 2019-03-04 ENCOUNTER — Encounter

## 2019-03-04 DIAGNOSIS — F319 Bipolar disorder, unspecified: Secondary | ICD-10-CM | POA: Diagnosis not present

## 2019-03-04 NOTE — Progress Notes (Signed)
[facility] Counselor/Therapist Progress Note  Patient ID: Megan Whitney, MRN: [id number],    Date: 03/04/2019  Time Spent: 60 minutes   9:00am To 10:00am  Treatment Type: Individual Therapy   Reported Symptoms:   anxiety, depressed (lessened), irritable "at times", impatience decreased,  sadness decreased, Denies current suicidal ideation.  Mental Status Exam:  Appearance:   casual  Behavior:  Sharing  Motor:  normal  Speech/Language:  Normal Rate  Affect:  Anxious  (but no longer spiraling)  Mood:  Anxious, some depressed mood  Thought process:  normal  Thought content:    WNL  Sensory/Perceptual disturbances:    WNL  Orientation:  oriented to person, place, time/date, situation, day of week, month of year and year  Attention:  Good  Concentration:  Good  Memory:  WNL  Fund of knowledge:   Good  Insight:    Fair  Judgment:   Fair  Impulse Control:  Fair   Risk Assessment: Danger to Self:  Denies current SI.   Self-injurious Behavior: No Danger to Others: No Duty to Warn:no Physical Aggression / Violence:No  Access to Firearms a concern: No  Gang Involvement:No   Subjective: Patient today is in office noting symptoms above and stating "I am better than last appt with less depression, less impatience, and less sadness."  She had received a call from nurse for medication suscriber saying that she "could go up on her Prozac to an extra capsule" however patient decided not to as she was concerned she may make her spiraling worse.  Feels no spiraling today.  Does feel some depression.  Is not following through with her hobbies, Noom app diet and exercise, however is still meeting with her knitting group (socially distanced and outside).  Denies any current SI, but does have thoughts episodically.  Understands how to access after hours emergency services if ever needed due to SI. Still using breathing exercises we've reviewed in sessions helped her calm down.  I am  urging her today to use the breathing exercises proactively in situations where she knows she experiences a lot of stress, rather than delaying.  Is on vacation this week but is not going away exept maybe a day trip. Shares that on 1-10 scale of mood stability when she came in today she is a "6".    Reviewed the self-care strategies, including grounding, that she needs to be doing for herself right now, including staying on meds as prescribed.  Has "intermittent suicidal thoughts but no plans" but "right now I'm not having any SI".  Is dealing with some negative thought patterns today around her husband, her work, and friends that she hasn't heard from recently and assuming they "just didn't want to talk to me."  Encouraging her to reach out to friends rather than waiting for them to call her and she readily agreed.  (Reviewed today and suggested she follow through on some of these activities as they help her also to feel more grounded.) Self-Care: One thing she is continuing to do differently is when husband starts drinking, she removes herself to go to another area of home and works on her painting or other crafts. These hobbies take concentration which helps me not concentrate on husband."  Still keeping in touch with other friends by phone or text, work on painting, read, occasional devotions, playing with her dog, and deep-breathing exercises which she reports has helped more at night.    Denies any current SI upon  leaving today.  Reviewed how to access after hours emergency services if needed at any time.  Interventions: Solution-Focused and CBT  Diagnosis:   ICD-10-CM   1. Bipolar I disorder with rapid cycling ([place])  F31.9     Plan:  Patient to follow through with with strategies discussed today to help with her depression and anxiety (although it has lessened some since last visit), and patient made a list to have with her. Next appt will be within 1-2wks for continued goal-directed treatment.    Shanon Ace, LCSW

## 2019-03-18 ENCOUNTER — Other Ambulatory Visit: Payer: Self-pay

## 2019-03-18 ENCOUNTER — Encounter (INDEPENDENT_AMBULATORY_CARE_PROVIDER_SITE_OTHER): Payer: Self-pay

## 2019-03-18 ENCOUNTER — Ambulatory Visit (INDEPENDENT_AMBULATORY_CARE_PROVIDER_SITE_OTHER): Payer: BC Managed Care – PPO | Admitting: Psychiatry

## 2019-03-18 ENCOUNTER — Ambulatory Visit: Payer: BC Managed Care – PPO | Admitting: Psychiatry

## 2019-03-18 DIAGNOSIS — F319 Bipolar disorder, unspecified: Secondary | ICD-10-CM

## 2019-03-18 NOTE — Progress Notes (Signed)
[facility] Counselor/Therapist Progress Note  Patient ID: Megan Whitney, MRN: [id number],    Date: 03/18/2019  Time Spent: 60 minutes   4:00pm to 5:00pm  Treatment Type: Individual Therapy   Reported Symptoms:   anxiety, depressed (lessened), irritable "at times", impatience,  sadness, Denies current suicidal ideation.  Mental Status Exam:  Appearance:   casual  Behavior:  Sharing  Motor:  normal  Speech/Language:  Normal Rate  Affect:  Anxious  (but no longer spiraling)  Mood:  Anxious, some depressed mood  Thought process:  normal  Thought content:    WNL  Sensory/Perceptual disturbances:    WNL  Orientation:  oriented to person, place, time/date, situation, day of week, month of year and year  Attention:  Good  Concentration:  Good  Memory:  WNL  Fund of knowledge:   Good  Insight:    Fair  Judgment:   Fair  Impulse Control:  Fair   Risk Assessment: Danger to Self:  Denies current SI.   Self-injurious Behavior: No Danger to Others: No Duty to Warn:no Physical Aggression / Violence:No  Access to Firearms a concern: No  Gang Involvement:No   Subjective:  Patient today is in office reporting symptoms above.  Due to husband's drinking, her symptoms have increased some since last appt. Patient reports husband was drinking heavily this past weekend, they argued, he ended up falling and couldn't get up immediately. Despite medical issues and getting more prone to diabetes per his doctor, he continues to drink. Until yesterday when he said he was quitting drinking. Patient frustrated and doesn't believe him and they've gone through this many times before with no real change. Frustrated, depression, anxiety, irritable, impatient, and sad that this scenario of husband's drinking keeps re-playing.  "And in the background, I have my anxiety about my job, the pandemic, the racial tensions, and other family dynamics.   Depression is a bit increased --some difficulty  concentrating, not very motivated, and hard to get up in the mornings.  Is not following through on some of her hobbies like painting, inconsistent with Noom app diet and sporadic exercise, however is still meeting with her knitting group (socially distanced and outside). Feels she lost her momentum and determination.  Feeling defeated. Worked hard on how to regain some motivation, momentum, and determination and talked about this in detail and patient responded well and was able to stay grounded during the conversation, spoke freely and took notes as we mapped out several steps and strategies for her. States she can turn things around and doing it.   Denies any current SI.  Still using breathing exercises we've reviewed in sessions that helped her calm down.  I am urging her today to use the breathing exercises proactively in situations where she knows she experiences a lot of stress, rather than delaying.  Reviewed the self-care strategies, including grounding, that she needs to be doing for herself right now, including staying on meds as prescribed.  Encouraging her to continue reaching out to friends to stay connected.   (Reviewed today and suggested she follow through on some of these activities as they help her also to feel more grounded.) Self-Care: One thing she is continuing to do differently is when husband starts drinking, she removes herself to go to another area of home and works on her painting or other crafts. These hobbies take concentration which helps me not concentrate on husband."  Still keeping in touch with other friends by phone  or text, work on painting, read, occasional devotions, playing with her dog, and deep-breathing exercises which she reports has helped more at night. Also encouraged to get outside at least a couple times a day, taking walks occasionally.   Denies any current SI upon leaving today.  Reviewed how to access after hours emergency services if needed at any  time.  Interventions: Solution-Focused and CBT  Diagnosis:   ICD-10-CM   1. Bipolar I disorder with rapid cycling ([place])  F31.9     Plan:  Patient to follow through with with strategies discussed today to help with her depression and anxiety (although it has lessened some since last visit), and patient made a list to have with her. Next appt will be within 2wks  for continued goal-directed treatment.   Shanon Ace, LCSW

## 2019-03-28 DIAGNOSIS — R3915 Urgency of urination: Secondary | ICD-10-CM | POA: Diagnosis not present

## 2019-03-29 ENCOUNTER — Telehealth: Payer: Self-pay | Admitting: Psychiatry

## 2019-03-29 NOTE — Telephone Encounter (Signed)
Pt requesting a refill on her Focalin 25mg  ER capsule. Have script filled at the [facility] on [address]

## 2019-03-29 NOTE — Telephone Encounter (Signed)
Pt should have 1 more rx on file, dated fill on 03/26/2019. Needs to check with pharmacy

## 2019-04-01 ENCOUNTER — Ambulatory Visit (INDEPENDENT_AMBULATORY_CARE_PROVIDER_SITE_OTHER): Payer: BC Managed Care – PPO | Admitting: Psychiatry

## 2019-04-01 ENCOUNTER — Ambulatory Visit: Payer: BC Managed Care – PPO | Admitting: Psychiatry

## 2019-04-01 ENCOUNTER — Other Ambulatory Visit: Payer: Self-pay

## 2019-04-01 DIAGNOSIS — F319 Bipolar disorder, unspecified: Secondary | ICD-10-CM

## 2019-04-01 DIAGNOSIS — E785 Hyperlipidemia, unspecified: Secondary | ICD-10-CM | POA: Diagnosis not present

## 2019-04-01 DIAGNOSIS — J302 Other seasonal allergic rhinitis: Secondary | ICD-10-CM | POA: Diagnosis not present

## 2019-04-01 DIAGNOSIS — M25551 Pain in right hip: Secondary | ICD-10-CM | POA: Diagnosis not present

## 2019-04-01 NOTE — Progress Notes (Signed)
[facility] Counselor/Therapist Progress Note  Patient ID: Megan Whitney, MRN: [id number],    Date: 04/01/2019  Time Spent: 60 minutes   4:00pm to 5:00pm  Treatment Type: Individual Therapy   Reported Symptoms:   anxiety, depressed (lessened), irritable "at times", impatience,  sadness, Denies current suicidal ideation.  Mental Status Exam:  Appearance:   casual  Behavior:  Sharing  Motor:  normal  Speech/Language:  Normal Rate  Affect:  Anxious  (but no longer spiraling)  Mood:  Anxious, some depressed mood  Thought process:  normal  Thought content:    WNL  Sensory/Perceptual disturbances:    WNL  Orientation:  oriented to person, place, time/date, situation, day of week, month of year and year  Attention:  Good  Concentration:  Good  Memory:  WNL  Fund of knowledge:   Good  Insight:    Fair  Judgment:   Fair  Impulse Control:  Fair   Risk Assessment: Danger to Self:  Denies current SI.   Self-injurious Behavior: No Danger to Others: No Duty to Warn:no Physical Aggression / Violence:No  Access to Firearms a concern: No  Gang Involvement:No   Subjective: Patient today is in office reporting symptoms above.  Upset due to a family party held at her home this past weekend and husband's drinking was very out of control, and "I felt angry and disgusted, anxious, and tearful. Decided then that she  Would speak with attorney re: just to get information about possible separation "because I can't keep taking this---his drinking is as bad as ever."  She spoke with siblings of her husband and they are checking into having someone do an intervention with the family and patient's husband. She is also speaking with an attorney for a consultation "just for information but not to take action yet."  Also reminded her not to have access to any weapons and she reports she does not, that any weapon in her house is under lock and key and she does not have a key. Discussed this  thoroughly in session today and patient seemed to be calmer as she left.   Patient reports some sadness re: marital situation and husband's drinking.  Also her depression and anxiety she says are some better.  "of course, he always says he is getting help but then he doesn't."   Denies any current SI.  Does show some improved self-care. Still using breathing exercises we've reviewed in sessions that helped her calm down.  I am urging her today to use the breathing exercises proactively in situations where she knows she experiences a lot of stress, rather than using them only reactively.  Reviewed the self-care strategies, including grounding, that she needs to be doing for herself right now, including staying on meds as prescribed.  Encouraging her to continue reaching out to friends to stay connected.   (Reviewed today and suggested she follow through on some of these activities as they help her also to feel more grounded.) Self-Care: One thing she is continuing to do differently is when husband starts drinking, she removes herself to go to another area of home and works on her painting or other crafts. These hobbies take concentration which helps me not concentrate on husband."  Still keeping in touch with other friends by phone or text, work on painting, read, occasional devotions, playing with her dog, and deep-breathing exercises which she reports has helped more at night. Also encouraged to get outside at least a couple  times a day, taking walks occasionally.   Denies any current SI upon leaving today.  Reviewed how to access after hours emergency services if needed at any time.  Interventions: Solution-Focused and CBT  Diagnosis:   ICD-10-CM   1. Bipolar I disorder with rapid cycling ([place])  F31.9     Plan:  Patient to follow through with with strategies discussed today to help with her depression and anxiety (although it has lessened some since last visit), and patient made a list to have  with her. Next appt will be within 2wks  for continued goal-directed treatment.   Shanon Ace, LCSW

## 2019-04-04 ENCOUNTER — Encounter: Payer: Self-pay | Admitting: Physician Assistant

## 2019-04-04 ENCOUNTER — Ambulatory Visit: Payer: BC Managed Care – PPO | Admitting: Physician Assistant

## 2019-04-04 ENCOUNTER — Telehealth: Payer: Self-pay

## 2019-04-04 ENCOUNTER — Other Ambulatory Visit: Payer: Self-pay

## 2019-04-04 ENCOUNTER — Other Ambulatory Visit: Payer: Self-pay | Admitting: Physician Assistant

## 2019-04-04 VITALS — BP 100/76 | HR 87 | Temp 98.5°F | Resp 16 | Ht 60.0 in | Wt 159.0 lb

## 2019-04-04 DIAGNOSIS — E785 Hyperlipidemia, unspecified: Secondary | ICD-10-CM

## 2019-04-04 DIAGNOSIS — F319 Bipolar disorder, unspecified: Secondary | ICD-10-CM | POA: Diagnosis not present

## 2019-04-04 DIAGNOSIS — K219 Gastro-esophageal reflux disease without esophagitis: Secondary | ICD-10-CM

## 2019-04-04 DIAGNOSIS — Z86010 Personal history of colonic polyps: Secondary | ICD-10-CM

## 2019-04-04 DIAGNOSIS — D500 Iron deficiency anemia secondary to blood loss (chronic): Secondary | ICD-10-CM | POA: Diagnosis not present

## 2019-04-04 LAB — CBC WITH DIFFERENTIAL/PLATELET
Basophils Absolute: 0.1 10*3/uL (ref 0.0–0.1)
Basophils Relative: 1 % (ref 0.0–3.0)
Eosinophils Absolute: 1 10*3/uL — ABNORMAL HIGH (ref 0.0–0.7)
Eosinophils Relative: 13.3 % — ABNORMAL HIGH (ref 0.0–5.0)
HCT: 41.7 % (ref 36.0–46.0)
Hemoglobin: 13.9 g/dL (ref 12.0–15.0)
Lymphocytes Relative: 30.3 % (ref 12.0–46.0)
Lymphs Abs: 2.3 10*3/uL (ref 0.7–4.0)
MCHC: 33.3 g/dL (ref 30.0–36.0)
MCV: 92.2 fl (ref 78.0–100.0)
Monocytes Absolute: 0.7 10*3/uL (ref 0.1–1.0)
Monocytes Relative: 8.5 % (ref 3.0–12.0)
Neutro Abs: 3.6 10*3/uL (ref 1.4–7.7)
Neutrophils Relative %: 46.9 % (ref 43.0–77.0)
Platelets: 242 10*3/uL (ref 150.0–400.0)
RBC: 4.52 Mil/uL (ref 3.87–5.11)
RDW: 14.4 % (ref 11.5–15.5)
WBC: 7.7 10*3/uL (ref 4.0–10.5)

## 2019-04-04 LAB — COMPREHENSIVE METABOLIC PANEL
ALT: 13 U/L (ref 0–35)
AST: 19 U/L (ref 0–37)
Albumin: 4.5 g/dL (ref 3.5–5.2)
Alkaline Phosphatase: 70 U/L (ref 39–117)
BUN: 28 mg/dL — ABNORMAL HIGH (ref 6–23)
CO2: 29 mEq/L (ref 19–32)
Calcium: 9.9 mg/dL (ref 8.4–10.5)
Chloride: 104 mEq/L (ref 96–112)
Creatinine, Ser: 0.83 mg/dL (ref 0.40–1.20)
GFR: 69.31 mL/min (ref >60.00)
Glucose, Bld: 88 mg/dL (ref 70–99)
Potassium: 4.5 mEq/L (ref 3.5–5.1)
Sodium: 141 mEq/L (ref 135–145)
Total Bilirubin: 0.3 mg/dL (ref 0.2–1.2)
Total Protein: 7 g/dL (ref 6.0–8.3)

## 2019-04-04 LAB — LIPID PANEL
Cholesterol: 241 mg/dL — ABNORMAL HIGH (ref 0–200)
HDL: 77.8 mg/dL (ref >39.00)
LDL Cholesterol: 142 mg/dL — ABNORMAL HIGH (ref 0–99)
NonHDL: 162.95
Total CHOL/HDL Ratio: 3
Triglycerides: 106 mg/dL (ref 0.0–149.0)
VLDL: 21.2 mg/dL (ref 0.0–40.0)

## 2019-04-04 LAB — VITAMIN D 25 HYDROXY (VIT D DEFICIENCY, FRACTURES): VITD: >120 ng/mL

## 2019-04-04 LAB — HEMOGLOBIN A1C: Hgb A1c MFr Bld: 5.6 % (ref 4.6–6.5)

## 2019-04-04 LAB — TSH: TSH: 1.68 u[IU]/mL (ref 0.35–4.50)

## 2019-04-04 NOTE — Telephone Encounter (Signed)
She needs to stop her Vit D immediately.  She will also need to come in tomorrow for an EKG

## 2019-04-04 NOTE — Patient Instructions (Signed)
Please go to the lab today for blood work.  I will call you with your results. We will alter treatment regimen(s) if indicated by your results.   Please follow with your specialists as scheduled.  We will get records to review and update any care gaps if present.  It was very nice meeting you today. Welcome to [facility]!

## 2019-04-04 NOTE — Telephone Encounter (Signed)
CRITICAL VALUE STICKER  CRITICAL VALUE: Vitamin D >120   RECEIVER (on-site recipient of call): Katie  DATE & TIME NOTIFIED: 04/04/19 4 PM  MESSENGER (representative from lab): Hope from [facility]   MD NOTIFIED: PCP, Birdie Riddle

## 2019-04-04 NOTE — Progress Notes (Signed)
Patient presents to clinic today to establish care.  Acute Concerns: Denies concern today.  Chronic Issues: Bipolar Disorder -- Is currently on a regimen of Fluoxetine 60 mg QD, Equetro 200 mg QD, Lamictal 200 mg QD, Lithium 150 mg QD, Vraylar 3 mg QD. Is seeing Dr. Clovis Pu (Psychiatry) every 4 weeks. Also sees therapist every 2 weeks. Endorses mood has been stable overall recently with this regimen.   Hx Colon Polyps -- UTD on colonoscopy.   GERD -- Has been managed by GI (Dr. Henrene Pastor). Taking PPI daily. Notes occasional breakthrough symptoms. Has upcoming appointment to discuss.    Hyperlipidemia -- On a regimen of Atorvastatin 10 mg QD. Taking as directed. Notes has been controlled on this for some time but has been a long time since she had labs.  Hx Lung Cancer -- Remission for > 15 years.   Health Maintenance: Immunizations -- UTD Colonoscopy -- UTD. Recall 2025. Mammogram -- Due. Mammogram scheduled.  PAP -- Followed by GYN. UTD. [facility]. Has follow-up next week.   Past Medical History:  Diagnosis Date  . Allergy    Seasonal  . Anemia    History of GI blood loss  . Anxiety   . Arthritis   . Bipolar 1 disorder ([place] )   . Colon polyps   . Depression   . Epistaxis    Around 2011 or 2012, required cauterization.   . Esophageal stricture   . GERD (gastroesophageal reflux disease)   . Headache(784.0)   . Hyperlipidemia   . Interstitial cystitis   . Lung cancer ([place]) 2002  . Obesity   . Sleep apnea    Doesn't use a CPAP    Past Surgical History:  Procedure Laterality Date  . BALLOON DILATION  05/16/2012   Procedure: BALLOON DILATION;  Surgeon: Inda Castle, MD;  Location: [facility];  Service: Endoscopy;  Laterality: N/A;  . COLONOSCOPY    . ENTEROSCOPY  05/16/2012   Procedure: ENTEROSCOPY;  Surgeon: Inda Castle, MD;  Location: [facility];  Service: Endoscopy;  Laterality: N/A;  . JOINT REPLACEMENT    . right shoulder durgery 25 yrs ago    .  TOTAL HIP ARTHROPLASTY  2006, 2008   bilateral  . TUBAL LIGATION    . WEDGE RESECTION  2002   lung cancer    Current Outpatient Medications on File Prior to Visit  Medication Sig Dispense Refill  . atorvastatin (LIPITOR) 10 MG tablet Take 10 mg by mouth daily.    . cetirizine (ZYRTEC) 10 MG tablet Take 10 mg by mouth daily.    . Cholecalciferol 1.25 MG (50000 UT) capsule Take 1 capsule (50,000 Units total) by mouth 3 (three) times a week. (Patient not taking: Reported on 04/05/2019) 12 capsule 5  . dexmethylphenidate (FOCALIN) 10 MG tablet Take 1 tablet (10 mg total) by mouth 2 (two) times daily. (Patient taking differently: Take 10 mg by mouth daily. ) 60 tablet 0  . Dexmethylphenidate HCl 25 MG CP24 Take 25 mg by mouth daily. 30 capsule 0  . Dexmethylphenidate HCl 25 MG CP24 Take 25 mg every morning. 30 capsule 0  . Dexmethylphenidate HCl 25 MG CP24 Take 25 mg by mouth every morning. 30 capsule 0  . EQUETRO 200 MG CP12 12 hr capsule TAKE 1 CAPSULE(200 MG) BY MOUTH AT BEDTIME. STOP THE EQUETRO 300 MG CAPSULE (Patient taking differently: Take 200 mg by mouth at bedtime. ) 60 capsule 3  . ferrous gluconate (FERGON) 324 MG tablet  Take 324 mg by mouth daily with breakfast.    . FLUoxetine (PROZAC) 20 MG capsule Take 3 capsules (60 mg total) by mouth daily. (Patient taking differently: Take 40 mg by mouth daily. ) 90 capsule 1  . lamoTRIgine (LAMICTAL) 200 MG tablet TAKE 1 TABLET(200 MG) BY MOUTH TWICE DAILY 60 tablet 2  . lithium carbonate 150 MG capsule TAKE 1 CAPSULE(150 MG) BY MOUTH DAILY 90 capsule 1  . Multiple Vitamin (MULTIVITAMIN) tablet Take 1 tablet by mouth daily.    Marland Kitchen omeprazole (PRILOSEC) 20 MG capsule Take 20 mg by mouth at bedtime.    . pantoprazole (PROTONIX) 40 MG tablet Take 40 mg by mouth daily.    Marland Kitchen VRAYLAR capsule TAKE 1 CAPSULE(3 MG) BY MOUTH DAILY 30 capsule 2   No current facility-administered medications on file prior to visit.     Allergies  Allergen Reactions  .  Azithromycin Anaphylaxis  . Penicillins Anaphylaxis    DID THE REACTION INVOLVE: Swelling of the face/tongue/throat, SOB, or low BP? Yes Sudden or severe rash/hives, skin peeling, or the inside of the mouth or nose? Yes Did it require medical treatment? No When did it last happen? If all above answers are "NO", may proceed with cephalosporin use.  . Adhesive [Tape] Other (See Comments)    On bandaids    Family History  Problem Relation Age of Onset  . Arthritis Mother   . Hearing loss Mother   . Hyperlipidemia Mother   . Hypertension Mother   . Hypertension Father   . Diabetes Mellitus II Father   . Heart disease Father   . Arthritis Father   . Cancer Father        Brain  . COPD Father   . Diabetes Father   . Hyperlipidemia Father   . Early death Sister        Aneroxia/Bulimic  . Depression Brother   . Early death Investment banker, corporate  . Depression Daughter   . Drug abuse Daughter   . Heart disease Daughter   . Hypertension Daughter   . Stroke Maternal Grandmother   . Hypertension Maternal Grandmother   . Arthritis Maternal Grandfather   . Heart attack Maternal Grandfather   . Hearing loss Maternal Grandfather   . Colon cancer Neg Hx   . Esophageal cancer Neg Hx   . Rectal cancer Neg Hx     Social History   Socioeconomic History  . Marital status: Married    Spouse name: Not on file  . Number of children: 1  . Years of education: Not on file  . Highest education level: Not on file  Occupational History  . Occupation: Admin. assistant  Social Needs  . Financial resource strain: Not on file  . Food insecurity    Worry: Not on file    Inability: Not on file  . Transportation needs    Medical: Not on file    Non-medical: Not on file  Tobacco Use  . Smoking status: Never Smoker  . Smokeless tobacco: Never Used  Substance and Sexual Activity  . Alcohol use: Yes    Alcohol/week: 1.0 standard drinks    Types: 1 Glasses of wine per week     Comment: Moderate  . Drug use: No  . Sexual activity: Not Currently  Lifestyle  . Physical activity    Days per week: Not on file    Minutes per session: Not on file  . Stress:  Not on file  Relationships  . Social Herbalist on phone: Not on file    Gets together: Not on file    Attends religious service: Not on file    Active member of club or organization: Not on file    Attends meetings of clubs or organizations: Not on file    Relationship status: Not on file  . Intimate partner violence    Fear of current or ex partner: Not on file    Emotionally abused: Not on file    Physically abused: Not on file    Forced sexual activity: Not on file  Other Topics Concern  . Not on file  Social History Narrative  . Not on file   Review of Systems  Constitutional: Negative for fever and weight loss.  HENT: Negative for ear discharge, ear pain, hearing loss and tinnitus.   Eyes: Negative for blurred vision, double vision, photophobia and pain.  Respiratory: Negative for cough and shortness of breath.   Cardiovascular: Negative for chest pain and palpitations.  Gastrointestinal: Negative for abdominal pain, blood in stool, constipation, diarrhea, heartburn, melena, nausea and vomiting.  Genitourinary: Negative for dysuria, flank pain, frequency, hematuria and urgency.  Musculoskeletal: Negative for falls.  Neurological: Negative for dizziness, loss of consciousness and headaches.  Endo/Heme/Allergies: Negative for environmental allergies.  Psychiatric/Behavioral: Negative for depression, hallucinations, substance abuse and suicidal ideas. The patient is not nervous/anxious and does not have insomnia.    BP 100/76   Pulse 87   Temp 98.5 F (36.9 C) (Skin)   Resp 16   Ht 5' (1.524 m)   Wt 159 lb (72.1 kg)   SpO2 97%   BMI 31.05 kg/m   Physical Exam Vitals signs reviewed.  HENT:     Head: Normocephalic and atraumatic.     Right Ear: Tympanic membrane, ear canal and  external ear normal.     Left Ear: Tympanic membrane, ear canal and external ear normal.     Nose: Nose normal. No mucosal edema.     Mouth/Throat:     Pharynx: Uvula midline. No oropharyngeal exudate or posterior oropharyngeal erythema.  Eyes:     Conjunctiva/sclera: Conjunctivae normal.     Pupils: Pupils are equal, round, and reactive to light.  Neck:     Musculoskeletal: Neck supple.     Thyroid: No thyromegaly.  Cardiovascular:     Rate and Rhythm: Normal rate and regular rhythm.     Heart sounds: Normal heart sounds.  Pulmonary:     Effort: Pulmonary effort is normal. No respiratory distress.     Breath sounds: Normal breath sounds. No wheezing or rales.  Abdominal:     General: Bowel sounds are normal. There is no distension.     Palpations: Abdomen is soft. There is no mass.     Tenderness: There is no abdominal tenderness. There is no guarding or rebound.  Lymphadenopathy:     Cervical: No cervical adenopathy.  Skin:    General: Skin is warm and dry.     Findings: No rash.  Neurological:     Mental Status: She is alert and oriented to person, place, and time.     Cranial Nerves: No cranial nerve deficit.    Assessment/Plan: 1. Bipolar 1 disorder ([place]) Managed by Psychiatry. Will update labs today as she notes no routine labs with Psychiatry despite polypharmacy. - CBC with Differential/Platelet - Comprehensive metabolic panel - TSH  2. Gastroesophageal reflux disease without esophagitis Managed  by GI. Follow-up with specialist as scheduled.  3. Iron deficiency anemia due to chronic blood loss Remote history. Will repeat labs today to assess.   4. Personal history of colonic polyps Colonoscopy UTD.  5. Hyperlipidemia, unspecified hyperlipidemia type Taking statin as directed. Dietary and exercise recommendations reviewed. Fasting labs today. - CBC with Differential/Platelet - Comprehensive metabolic panel - Hemoglobin A1c - Lipid panel - Vitamin D (25  hydroxy)   Leeanne Rio, PA-C

## 2019-04-04 NOTE — Telephone Encounter (Signed)
Patient given instructions per Annye Asa. Scheduled appt 04/05/19 @ 230 PM

## 2019-04-05 ENCOUNTER — Ambulatory Visit (INDEPENDENT_AMBULATORY_CARE_PROVIDER_SITE_OTHER): Payer: BC Managed Care – PPO | Admitting: Physician Assistant

## 2019-04-05 ENCOUNTER — Encounter: Payer: Self-pay | Admitting: Physician Assistant

## 2019-04-05 VITALS — BP 118/78 | HR 84 | Temp 98.8°F | Resp 16 | Ht 60.0 in | Wt 159.0 lb

## 2019-04-05 DIAGNOSIS — R7989 Other specified abnormal findings of blood chemistry: Secondary | ICD-10-CM

## 2019-04-05 NOTE — Progress Notes (Signed)
Patient presents to clinic today for assessment of critical Vitamin D level (> 120) noted yesterday afternoon when lab results came in. She is prescribed 50,000 I/U three times weekly by her Psychiatrist. She was told to stop supplementation as soon as results were in. On discussion, she is not having SOB, racing heart, LH or dizziness. She has stopped Vitamin D supplement. She notes again she has been on this for a while. Notes her Psychiatrist has never checked a Vitamin D level that she is aware of.   Past Medical History:  Diagnosis Date  . Allergy    Seasonal  . Anemia    History of GI blood loss  . Anxiety   . Arthritis   . Bipolar 1 disorder ([place])   . Colon polyps   . Depression   . Epistaxis    Around 2011 or 2012, required cauterization.   . Esophageal stricture   . GERD (gastroesophageal reflux disease)   . Headache(784.0)   . Hyperlipidemia   . Interstitial cystitis   . Lung cancer ([place]) 2002  . Obesity   . Sleep apnea    Doesn't use a CPAP    Current Outpatient Medications on File Prior to Visit  Medication Sig Dispense Refill  . atorvastatin (LIPITOR) 10 MG tablet Take 10 mg by mouth daily.    . cetirizine (ZYRTEC) 10 MG tablet Take 10 mg by mouth daily.    Marland Kitchen dexmethylphenidate (FOCALIN) 10 MG tablet Take 1 tablet (10 mg total) by mouth 2 (two) times daily. (Patient taking differently: Take 10 mg by mouth daily. ) 60 tablet 0  . Dexmethylphenidate HCl 25 MG CP24 Take 25 mg by mouth daily. 30 capsule 0  . Dexmethylphenidate HCl 25 MG CP24 Take 25 mg every morning. 30 capsule 0  . Dexmethylphenidate HCl 25 MG CP24 Take 25 mg by mouth every morning. 30 capsule 0  . EQUETRO 200 MG CP12 12 hr capsule TAKE 1 CAPSULE(200 MG) BY MOUTH AT BEDTIME. STOP THE EQUETRO 300 MG CAPSULE (Patient taking differently: Take 200 mg by mouth at bedtime. ) 60 capsule 3  . ferrous gluconate (FERGON) 324 MG tablet Take 324 mg by mouth daily with breakfast.    . FLUoxetine (PROZAC) 20 MG  capsule Take 3 capsules (60 mg total) by mouth daily. (Patient taking differently: Take 40 mg by mouth daily. ) 90 capsule 1  . lamoTRIgine (LAMICTAL) 200 MG tablet TAKE 1 TABLET(200 MG) BY MOUTH TWICE DAILY 60 tablet 2  . lithium carbonate 150 MG capsule TAKE 1 CAPSULE(150 MG) BY MOUTH DAILY 90 capsule 1  . Multiple Vitamin (MULTIVITAMIN) tablet Take 1 tablet by mouth daily.    Marland Kitchen omeprazole (PRILOSEC) 20 MG capsule Take 20 mg by mouth at bedtime.    . pantoprazole (PROTONIX) 40 MG tablet Take 40 mg by mouth daily.    Marland Kitchen VRAYLAR capsule TAKE 1 CAPSULE(3 MG) BY MOUTH DAILY 30 capsule 2  . Cholecalciferol 1.25 MG (50000 UT) capsule Take 1 capsule (50,000 Units total) by mouth 3 (three) times a week. (Patient not taking: Reported on 04/05/2019) 12 capsule 5   No current facility-administered medications on file prior to visit.     Allergies  Allergen Reactions  . Azithromycin Anaphylaxis  . Penicillins Anaphylaxis    DID THE REACTION INVOLVE: Swelling of the face/tongue/throat, SOB, or low BP? Yes Sudden or severe rash/hives, skin peeling, or the inside of the mouth or nose? Yes Did it require medical treatment? No When did  it last happen? If all above answers are "NO", may proceed with cephalosporin use.  . Adhesive [Tape] Other (See Comments)    On bandaids    Family History  Problem Relation Age of Onset  . Arthritis Mother   . Hearing loss Mother   . Hyperlipidemia Mother   . Hypertension Mother   . Hypertension Father   . Diabetes Mellitus II Father   . Heart disease Father   . Arthritis Father   . Cancer Father        Brain  . COPD Father   . Diabetes Father   . Hyperlipidemia Father   . Early death Sister        Aneroxia/Bulimic  . Depression Brother   . Early death Investment banker, corporate  . Depression Daughter   . Drug abuse Daughter   . Heart disease Daughter   . Hypertension Daughter   . Stroke Maternal Grandmother   . Hypertension Maternal  Grandmother   . Arthritis Maternal Grandfather   . Heart attack Maternal Grandfather   . Hearing loss Maternal Grandfather   . Colon cancer Neg Hx   . Esophageal cancer Neg Hx   . Rectal cancer Neg Hx     Social History   Socioeconomic History  . Marital status: Married    Spouse name: Not on file  . Number of children: 1  . Years of education: Not on file  . Highest education level: Not on file  Occupational History  . Occupation: Admin. assistant  Social Needs  . Financial resource strain: Not on file  . Food insecurity    Worry: Not on file    Inability: Not on file  . Transportation needs    Medical: Not on file    Non-medical: Not on file  Tobacco Use  . Smoking status: Never Smoker  . Smokeless tobacco: Never Used  Substance and Sexual Activity  . Alcohol use: Yes    Alcohol/week: 1.0 standard drinks    Types: 1 Glasses of wine per week    Comment: Moderate  . Drug use: No  . Sexual activity: Not Currently  Lifestyle  . Physical activity    Days per week: Not on file    Minutes per session: Not on file  . Stress: Not on file  Relationships  . Social Herbalist on phone: Not on file    Gets together: Not on file    Attends religious service: Not on file    Active member of club or organization: Not on file    Attends meetings of clubs or organizations: Not on file    Relationship status: Not on file  Other Topics Concern  . Not on file  Social History Narrative  . Not on file   Review of Systems - See HPI.  All other ROS are negative.  BP 118/78   Pulse 84   Temp 98.8 F (37.1 C) (Skin)   Resp 16   Ht 5' (1.524 m)   Wt 159 lb (72.1 kg)   SpO2 97%   BMI 31.05 kg/m   Physical Exam Vitals signs reviewed.  Constitutional:      Appearance: Normal appearance.  HENT:     Head: Normocephalic and atraumatic.  Neck:     Musculoskeletal: Neck supple.  Cardiovascular:     Rate and Rhythm: Normal rate and regular rhythm.     Pulses:  Normal pulses.  Heart sounds: Normal heart sounds.  Pulmonary:     Effort: Pulmonary effort is normal.     Breath sounds: Normal breath sounds.  Neurological:     General: No focal deficit present.     Mental Status: She is alert and oriented to person, place, and time.     Recent Results (from the past 2160 hour(s))  CBC with Differential/Platelet     Status: Abnormal   Collection Time: 04/04/19 10:04 AM  Result Value Ref Range   WBC 7.7 4.0 - 10.5 K/uL   RBC 4.52 3.87 - 5.11 Mil/uL   Hemoglobin 13.9 12.0 - 15.0 g/dL   HCT 41.7 36.0 - 46.0 %   MCV 92.2 78.0 - 100.0 fl   MCHC 33.3 30.0 - 36.0 g/dL   RDW 14.4 11.5 - 15.5 %   Platelets 242.0 150.0 - 400.0 K/uL   Neutrophils Relative % 46.9 43.0 - 77.0 %   Lymphocytes Relative 30.3 12.0 - 46.0 %   Monocytes Relative 8.5 3.0 - 12.0 %   Eosinophils Relative 13.3 (H) 0.0 - 5.0 %   Basophils Relative 1.0 0.0 - 3.0 %   Neutro Abs 3.6 1.4 - 7.7 K/uL   Lymphs Abs 2.3 0.7 - 4.0 K/uL   Monocytes Absolute 0.7 0.1 - 1.0 K/uL   Eosinophils Absolute 1.0 (H) 0.0 - 0.7 K/uL   Basophils Absolute 0.1 0.0 - 0.1 K/uL  Comprehensive metabolic panel     Status: Abnormal   Collection Time: 04/04/19 10:04 AM  Result Value Ref Range   Sodium 141 135 - 145 mEq/L   Potassium 4.5 3.5 - 5.1 mEq/L   Chloride 104 96 - 112 mEq/L   CO2 29 19 - 32 mEq/L   Glucose, Bld 88 70 - 99 mg/dL   BUN 28 (H) 6 - 23 mg/dL   Creatinine, Ser 0.83 0.40 - 1.20 mg/dL   Total Bilirubin 0.3 0.2 - 1.2 mg/dL   Alkaline Phosphatase 70 39 - 117 U/L   AST 19 0 - 37 U/L   ALT 13 0 - 35 U/L   Total Protein 7.0 6.0 - 8.3 g/dL   Albumin 4.5 3.5 - 5.2 g/dL   Calcium 9.9 8.4 - 10.5 mg/dL   GFR 69.31 >60.00 mL/min  Hemoglobin A1c     Status: None   Collection Time: 04/04/19 10:04 AM  Result Value Ref Range   Hgb A1c MFr Bld 5.6 4.6 - 6.5 %    Comment: Glycemic Control Guidelines for People with Diabetes:Non Diabetic:  <6%Goal of Therapy: <7%Additional Action Suggested:  >8%    Lipid panel     Status: Abnormal   Collection Time: 04/04/19 10:04 AM  Result Value Ref Range   Cholesterol 241 (H) 0 - 200 mg/dL    Comment: ATP III Classification       Desirable:  < 200 mg/dL               Borderline High:  200 - 239 mg/dL          High:  > = 240 mg/dL   Triglycerides 106.0 0.0 - 149.0 mg/dL    Comment: Normal:  <150 mg/dLBorderline High:  150 - 199 mg/dL   HDL 77.80 >39.00 mg/dL   VLDL 21.2 0.0 - 40.0 mg/dL   LDL Cholesterol 142 (H) 0 - 99 mg/dL   Total CHOL/HDL Ratio 3     Comment:                Men  Women1/2 Average Risk     3.4          3.3Average Risk          5.0          4.42X Average Risk          9.6          7.13X Average Risk          15.0          11.0                       NonHDL 162.95     Comment: NOTE:  Non-HDL goal should be 30 mg/dL higher than patient's LDL goal (i.e. LDL goal of < 70 mg/dL, would have non-HDL goal of < 100 mg/dL)  TSH     Status: None   Collection Time: 04/04/19 10:04 AM  Result Value Ref Range   TSH 1.68 0.35 - 4.50 uIU/mL  Vitamin D (25 hydroxy)     Status: None   Collection Time: 04/04/19 10:04 AM  Result Value Ref Range   VITD >120 ng/mL    Assessment/Plan: 1. High serum vitamin D Critical Vit D level. No symptoms or signs of toxicity or true poisoning. EKG obtained revealing NSR. Continue complete cessation of Vitamin D.  Will repeat levels in 2 weeks. Will slowly restart Vitamin D at much lower dosing if indicated. Discussed with her to let us manage her vitamin levels as Psychiatry is giving her extremely high doses of Vitamin without monitoring levels. - EKG 12-Lead   Leeanne Rio, PA-C

## 2019-04-07 ENCOUNTER — Encounter: Payer: Self-pay | Admitting: Physician Assistant

## 2019-04-09 ENCOUNTER — Other Ambulatory Visit: Payer: Self-pay | Admitting: Psychiatry

## 2019-04-09 NOTE — Telephone Encounter (Signed)
Please clarify, pt suppose to be on prozac 20 mg? Last office visit was suppose to decrease down to 20 mg, but asked to go back up from a phone message. Has follow up in a couple of weeks.

## 2019-04-15 ENCOUNTER — Encounter

## 2019-04-15 ENCOUNTER — Ambulatory Visit (INDEPENDENT_AMBULATORY_CARE_PROVIDER_SITE_OTHER): Payer: BC Managed Care – PPO | Admitting: Psychiatry

## 2019-04-15 ENCOUNTER — Other Ambulatory Visit: Payer: Self-pay

## 2019-04-15 ENCOUNTER — Ambulatory Visit: Payer: BC Managed Care – PPO | Admitting: Psychiatry

## 2019-04-15 ENCOUNTER — Encounter: Payer: Self-pay | Admitting: Psychiatry

## 2019-04-15 DIAGNOSIS — F411 Generalized anxiety disorder: Secondary | ICD-10-CM

## 2019-04-15 DIAGNOSIS — G251 Drug-induced tremor: Secondary | ICD-10-CM | POA: Diagnosis not present

## 2019-04-15 DIAGNOSIS — R251 Tremor, unspecified: Secondary | ICD-10-CM

## 2019-04-15 DIAGNOSIS — F900 Attention-deficit hyperactivity disorder, predominantly inattentive type: Secondary | ICD-10-CM

## 2019-04-15 DIAGNOSIS — R7989 Other specified abnormal findings of blood chemistry: Secondary | ICD-10-CM

## 2019-04-15 DIAGNOSIS — F319 Bipolar disorder, unspecified: Secondary | ICD-10-CM

## 2019-04-15 MED ORDER — DEXMETHYLPHENIDATE HCL 10 MG PO TABS: 10.0000 mg | ORAL_TABLET | Freq: Every day | ORAL | 0 refills | Status: DC

## 2019-04-15 MED ORDER — DEXMETHYLPHENIDATE HCL ER 25 MG PO CP24: 25.0000 mg | ORAL_CAPSULE | ORAL | 0 refills | Status: DC

## 2019-04-15 MED ORDER — CARIPRAZINE HCL 3 MG PO CAPS: 3.0000 mg | ORAL_CAPSULE | Freq: Every day | ORAL | 2 refills | Status: DC

## 2019-04-15 MED ORDER — DEXMETHYLPHENIDATE HCL ER 25 MG PO CP24: 25.0000 mg | ORAL_CAPSULE | Freq: Every day | ORAL | 0 refills | Status: DC

## 2019-04-15 MED ORDER — DEXMETHYLPHENIDATE HCL ER 25 MG PO CP24: ORAL_CAPSULE | ORAL | 0 refills | Status: DC
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
## 2019-04-15 NOTE — Progress Notes (Signed)
Megan Whitney [id number] 1956/02/25 63 y.o.  Subjective:   Patient ID:  Megan Whitney is a 63 y.o. (DOB [birth year]) female.   Chief Complaint:  No chief complaint on file.   Depression        Associated symptoms include no decreased concentration and no suicidal ideas.  Past medical history includes anxiety.   Anxiety Symptoms include nervous/anxious behavior. Patient reports no confusion, decreased concentration, dizziness or suicidal ideas.    Medication Refill Associated symptoms include arthralgias. Pertinent negatives include no weakness.   Megan Whitney is contacted for follow-up of recent problems with falling and chronic mood swings and anxiety and changes in medications made at the last visit.   At visit December 27, 2018.  Focalin XR was increased from 20 mg to 25 mg daily to help with focus and attention and potentially mood.  Last seen February 13, 2019.  In an effort to reduce mood cycling we reduce fluoxetine to 20 mg daily. Megan Whitney called back on June 17 and then again on June 19 asking to increase fluoxetine back to 40 mg daily because Megan Whitney was feeling more depressed.  Clearly that had not been enough time to evaluate or adjust to the reduction in fluoxetine but per Megan Whitney request Megan Whitney was allowed to increase the dosage back up after being given that information.  Given the information Megan Whitney decided to give the fluoxetine 20 a longer trial and has seemed to adjust to it. Overall doing real well.  Not depressed in over a week without SI.  Still anxious and impatient but less severe.  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  Still working off an on at work or home.  Busy time of year.  1 1/2 year from retirement and worries about layoff.  Physically better but some depression 3/10 and worse last week and anxiety 7-10/10 bc breakins in the neighborhood and irritability. The face contortions and tremor are nearly resolved.  Not dragging feet or stumbling.  Mainly at night Racing thoughts.   Affects sleep at times variably.  At visit March 31 because of concerns about falling and possible early tardive dyskinesia mouth movements we reduced the Vraylar from 4.5 to 3 mg daily.  Also because of concerns about a pattern of rapid cycling which had been present we reduced the fluoxetine from 60 mg to 40 mg daily and now down to 20 mg daily.  Successful so far.  Recent serum vitamin D level was determined to be low at 33.  The goal and chronically depressed patient's is in the 50s if possible.  So Megan Whitney vitamin D was increased on December 4 or thereabouts.  Therapy helping Past Psychiatric Medication Trials: Vrayla 4.5 SE, lithium 150, Equetro, Lamictal 200 twice daily, Latuda 80, Focalin, fluoxetine 60, Trileptal 450, olanzapine, Seroquel, Depakote, risperidone, buspirone, Abilify, ropinirole, amantadine, Sinemet, Ritalin, Artane, Cogentin, trazodone hangover, Ambien hangover, sertraline 100, Wellbutrin history of facial tics, paroxetine cognitive side effects  Review of Systems:  Review of Systems  Musculoskeletal: Positive for arthralgias. Negative for gait problem.  Neurological: Positive for tremors. Negative for dizziness and weakness.       Others see HPI  Psychiatric/Behavioral: Positive for depression. Negative for agitation, behavioral problems, confusion, decreased concentration, dysphoric mood, hallucinations, self-injury, sleep disturbance and suicidal ideas. The patient is nervous/anxious. The patient is not hyperactive.   No falls since here. Not currently depressed but unable to remove this from the list.  Medications: I have reviewed the patient's current medications.  Current Outpatient Medications  Medication Sig Dispense Refill  . atorvastatin (LIPITOR) 10 MG tablet Take 10 mg by mouth daily.    . cetirizine (ZYRTEC) 10 MG tablet Take 10 mg by mouth daily.    . Cholecalciferol 1.25 MG (50000 UT) capsule Take 1 capsule (50,000 Units total) by mouth 3 (three) times a  week. (Patient not taking: Reported on 04/05/2019) 12 capsule 5  . dexmethylphenidate (FOCALIN) 10 MG tablet Take 1 tablet (10 mg total) by mouth 2 (two) times daily. (Patient taking differently: Take 10 mg by mouth daily. ) 60 tablet 0  . Dexmethylphenidate HCl 25 MG CP24 Take 25 mg by mouth daily. 30 capsule 0  . Dexmethylphenidate HCl 25 MG CP24 Take 25 mg every morning. 30 capsule 0  . Dexmethylphenidate HCl 25 MG CP24 Take 25 mg by mouth every morning. 30 capsule 0  . EQUETRO 200 MG CP12 12 hr capsule TAKE 1 CAPSULE(200 MG) BY MOUTH AT BEDTIME. STOP THE EQUETRO 300 MG CAPSULE (Patient taking differently: Take 200 mg by mouth at bedtime. ) 60 capsule 3  . ferrous gluconate (FERGON) 324 MG tablet Take 324 mg by mouth daily with breakfast.    . FLUoxetine (PROZAC) 20 MG capsule Take 2 capsules (40 mg total) by mouth daily. 60 capsule 0  . lamoTRIgine (LAMICTAL) 200 MG tablet TAKE 1 TABLET(200 MG) BY MOUTH TWICE DAILY 60 tablet 2  . lithium carbonate 150 MG capsule TAKE 1 CAPSULE(150 MG) BY MOUTH DAILY 90 capsule 1  . Multiple Vitamin (MULTIVITAMIN) tablet Take 1 tablet by mouth daily.    Marland Kitchen omeprazole (PRILOSEC) 20 MG capsule Take 20 mg by mouth at bedtime.    . pantoprazole (PROTONIX) 40 MG tablet Take 40 mg by mouth daily.    Marland Kitchen VRAYLAR capsule TAKE 1 CAPSULE(3 MG) BY MOUTH DAILY 30 capsule 2   No current facility-administered medications for this visit.     Medication Side Effects: Other: tremor and weight gain.  Dyskinesia appears better  SE bettter than they were.  Allergies:  Allergies  Allergen Reactions  . Azithromycin Anaphylaxis  . Penicillins Anaphylaxis    DID THE REACTION INVOLVE: Swelling of the face/tongue/throat, SOB, or low BP? Yes Sudden or severe rash/hives, skin peeling, or the inside of the mouth or nose? Yes Did it require medical treatment? No When did it last happen? If all above answers are "NO", may proceed with cephalosporin use.  . Adhesive [Tape] Other  (See Comments)    On bandaids    Past Medical History:  Diagnosis Date  . Allergy    Seasonal  . Anemia    History of GI blood loss  . Anxiety   . Arthritis   . Bipolar 1 disorder ([place])   . Colon polyps   . Depression   . Epistaxis    Around 2011 or 2012, required cauterization.   . Esophageal stricture   . GERD (gastroesophageal reflux disease)   . Headache(784.0)   . Hyperlipidemia   . Interstitial cystitis   . Lung cancer ([place]) 2002  . Obesity   . Sleep apnea    Doesn't use a CPAP    Family History  Problem Relation Age of Onset  . Arthritis Mother   . Hearing loss Mother   . Hyperlipidemia Mother   . Hypertension Mother   . Hypertension Father   . Diabetes Mellitus II Father   . Heart disease Father   . Arthritis Father   . Cancer Father  Brain  . COPD Father   . Diabetes Father   . Hyperlipidemia Father   . Early death Sister        Aneroxia/Bulimic  . Depression Brother   . Early death Investment banker, corporate  . Depression Daughter   . Drug abuse Daughter   . Heart disease Daughter   . Hypertension Daughter   . Stroke Maternal Grandmother   . Hypertension Maternal Grandmother   . Arthritis Maternal Grandfather   . Heart attack Maternal Grandfather   . Hearing loss Maternal Grandfather   . Colon cancer Neg Hx   . Esophageal cancer Neg Hx   . Rectal cancer Neg Hx     Social History   Socioeconomic History  . Marital status: Married    Spouse name: Not on file  . Number of children: 1  . Years of education: Not on file  . Highest education level: Not on file  Occupational History  . Occupation: Admin. assistant  Social Needs  . Financial resource strain: Not on file  . Food insecurity    Worry: Not on file    Inability: Not on file  . Transportation needs    Medical: Not on file    Non-medical: Not on file  Tobacco Use  . Smoking status: Never Smoker  . Smokeless tobacco: Never Used  Substance and Sexual Activity  .  Alcohol use: Yes    Alcohol/week: 1.0 standard drinks    Types: 1 Glasses of wine per week    Comment: Moderate  . Drug use: No  . Sexual activity: Not Currently  Lifestyle  . Physical activity    Days per week: Not on file    Minutes per session: Not on file  . Stress: Not on file  Relationships  . Social Herbalist on phone: Not on file    Gets together: Not on file    Attends religious service: Not on file    Active member of club or organization: Not on file    Attends meetings of clubs or organizations: Not on file    Relationship status: Not on file  . Intimate partner violence    Fear of current or ex partner: Not on file    Emotionally abused: Not on file    Physically abused: Not on file    Forced sexual activity: Not on file  Other Topics Concern  . Not on file  Social History Narrative  . Not on file    Past Medical History, Surgical history, Social history, and Family history were reviewed and updated as appropriate.   Please see review of systems for further details on the patient's review from today.   Objective:   Physical Exam:  There were no vitals taken for this visit.  Physical Exam Constitutional:      General: Megan Whitney is not in acute distress.    Appearance: Megan Whitney is well-developed.  Musculoskeletal:        General: No deformity.  Neurological:     Mental Status: Megan Whitney is alert and oriented to person, place, and time.     Cranial Nerves: No dysarthria.     Coordination: Coordination normal.  Psychiatric:        Attention and Perception: Attention and perception normal. Megan Whitney does not perceive auditory or visual hallucinations.        Mood and Affect: Mood is anxious. Mood is not depressed. Affect is not labile, blunt,  angry, tearful or inappropriate.        Speech: Speech normal. Speech is not rapid and pressured or slurred.        Behavior: Behavior normal. Behavior is cooperative.        Thought Content: Thought content normal. Thought  content is not paranoid or delusional. Thought content does not include homicidal or suicidal ideation. Thought content does not include homicidal or suicidal plan.        Cognition and Memory: Cognition normal. Megan Whitney exhibits impaired recent memory.        Judgment: Judgment normal.     Comments: Insight fair.     Lab Review:     Component Value Date/Time   NA 141 04/04/2019 1004   K 4.5 04/04/2019 1004   CL 104 04/04/2019 1004   CO2 29 04/04/2019 1004   GLUCOSE 88 04/04/2019 1004   BUN 28 (H) 04/04/2019 1004   CREATININE 0.83 04/04/2019 1004   CALCIUM 9.9 04/04/2019 1004   PROT 7.0 04/04/2019 1004   ALBUMIN 4.5 04/04/2019 1004   AST 19 04/04/2019 1004   ALT 13 04/04/2019 1004   ALKPHOS 70 04/04/2019 1004   BILITOT 0.3 04/04/2019 1004   GFRNONAA >60 10/21/2018 0858   GFRAA >60 10/21/2018 0858       Component Value Date/Time   WBC 7.7 04/04/2019 1004   RBC 4.52 04/04/2019 1004   HGB 13.9 04/04/2019 1004   HCT 41.7 04/04/2019 1004   PLT 242.0 04/04/2019 1004   MCV 92.2 04/04/2019 1004   MCH 29.2 10/21/2018 0858   MCHC 33.3 04/04/2019 1004   RDW 14.4 04/04/2019 1004   LYMPHSABS 2.3 04/04/2019 1004   MONOABS 0.7 04/04/2019 1004   EOSABS 1.0 (H) 04/04/2019 1004   BASOSABS 0.1 04/04/2019 1004  Vitamin D level 33 on 10K units daily on 12/4/`9 Increased to prescription vitamin d 50K units Monday, Wed, Friday.  Rx sent in.   Lithium Lvl  Date Value Ref Range Status  10/21/2018 0.18 (L) 0.60 - 1.20 mmol/L Final    Comment:    Performed at [facility], [address], [place], [place] [address]     No results found for: PHENYTOIN, PHENOBARB, VALPROATE, CBMZ   .res Assessment: Plan:    No diagnosis found.  Megan Whitney has chronic rapid cycling bipolar disorder which is chronically unstable and has been difficult to control. Still cycling.  The rapid cycling is making it difficult to control frequency of depressive episodes and the anxiety as well.  We have had to reduce mood  stabilizer dosages including Vraylar and Equetro and now the higher dose fluoxetine may be contributing to the rapid cycling.  This was explained in detail to the patient.  Megan Whitney did not fully understand rapid cycling.  I encouraged Megan Whitney to continue journaling and mood charting to better recognize this.  Explained hypomania symptoms.  Megan Whitney has been successful at maintaining the lower dose of fluoxetine 20 mg a day which was reduced to help reduce the risk of mood cycling.  So far Megan Whitney is having less mood cycling but it is still early.  Less neurological problems with reduction in [place].  Maybe reduced Equetro helped that but it was not noticeable last time.  The following has stopped since the reduction in the [place].  Continue Vraylar  3 mg daily. Neurologic symptoms are better since the reduction in [place].  Megan Whitney apparent was having some parkinsonism as well as other EPS symptoms. Answered questions about cognition and psych meds.  Consider B vitamin.  Disc meds and absoption of B vitamins.  Continue Focalin XR to 25 mg daily.  Expect it will be less likely to cause rapid cycling then the fluoxetine. Continue Focalin immediate release 10 mg each afternoon.  Continue fluoxetine to 20 mg daily. Bc of rapid cylcing since the reduced the Vryalar and the [employer]. If that doesn't help irritability may have to reduce Focalin XR.  Continue Equetro X are 200 mg every afternoon and lamotrigine 200 mg twice daily and lithium 150 mg daily. Continue Equetro at the current low dose of 200 mg nightly.  But consider increasing it back up if the mood cycling does not improve.  It appears now that the Arman Filter was causing most of the neurologic problems and falling as opposed to the [place].  No med changes today.  Discussed side effects of each medicine.  Checked vitamin D level again and this time it was high at 120 and so it was stopped. Discussed potential metabolic side effects associated with atypical  antipsychotics, as well as potential risk for movement side effects. Advised pt to contact office if movement side effects occur.  Disc SE meds and this is heightened by the complication of necessary polypharmacy.  This appt was 30 mins.  Requires frequent FU DT chronic instability.  FU 8 weeks.  Lynder Parents MD, DFAPA  Please see After Visit Summary for patient specific instructions.  Future Appointments  Date Time Provider [place]  04/19/2019  3:30 PM [id number]  04/29/2019  4:00 PM Shanon Ace, LCSW [facility] None  05/14/2019  4:00 PM Shanon Ace, LCSW [facility] None  05/27/2019  4:00 PM Shanon Ace, LCSW [facility] None    No orders of the defined types were placed in this encounter.     -------------------------------

## 2019-04-19 ENCOUNTER — Ambulatory Visit: Payer: BC Managed Care – PPO

## 2019-04-23 ENCOUNTER — Other Ambulatory Visit: Payer: Self-pay

## 2019-04-23 DIAGNOSIS — R6889 Other general symptoms and signs: Secondary | ICD-10-CM | POA: Diagnosis not present

## 2019-04-23 DIAGNOSIS — Z20822 Contact with and (suspected) exposure to covid-19: Secondary | ICD-10-CM

## 2019-04-25 ENCOUNTER — Ambulatory Visit: Payer: BC Managed Care – PPO

## 2019-04-25 LAB — NOVEL CORONAVIRUS, NAA: SARS-CoV-2, NAA: NOT DETECTED

## 2019-04-29 ENCOUNTER — Ambulatory Visit: Payer: BC Managed Care – PPO | Admitting: Psychiatry

## 2019-05-01 ENCOUNTER — Other Ambulatory Visit: Payer: Self-pay

## 2019-05-01 ENCOUNTER — Ambulatory Visit (INDEPENDENT_AMBULATORY_CARE_PROVIDER_SITE_OTHER): Payer: BC Managed Care – PPO

## 2019-05-01 DIAGNOSIS — Z23 Encounter for immunization: Secondary | ICD-10-CM | POA: Diagnosis not present

## 2019-05-01 DIAGNOSIS — R7989 Other specified abnormal findings of blood chemistry: Secondary | ICD-10-CM | POA: Diagnosis not present

## 2019-05-01 LAB — VITAMIN D 25 HYDROXY (VIT D DEFICIENCY, FRACTURES): VITD: 97.23 ng/mL (ref 30.00–100.00)

## 2019-05-03 DIAGNOSIS — Z1231 Encounter for screening mammogram for malignant neoplasm of breast: Secondary | ICD-10-CM | POA: Diagnosis not present

## 2019-05-03 LAB — HM MAMMOGRAPHY

## 2019-05-07 ENCOUNTER — Other Ambulatory Visit: Payer: Self-pay | Admitting: Psychiatry

## 2019-05-07 DIAGNOSIS — F411 Generalized anxiety disorder: Secondary | ICD-10-CM

## 2019-05-07 DIAGNOSIS — F319 Bipolar disorder, unspecified: Secondary | ICD-10-CM

## 2019-05-08 ENCOUNTER — Encounter: Payer: Self-pay | Admitting: Physician Assistant

## 2019-05-08 ENCOUNTER — Other Ambulatory Visit: Payer: Self-pay

## 2019-05-08 ENCOUNTER — Ambulatory Visit: Payer: BC Managed Care – PPO | Admitting: Physician Assistant

## 2019-05-08 VITALS — BP 120/80 | HR 77 | Temp 97.7°F | Resp 16 | Ht 64.0 in | Wt 161.0 lb

## 2019-05-08 DIAGNOSIS — Z85118 Personal history of other malignant neoplasm of bronchus and lung: Secondary | ICD-10-CM | POA: Diagnosis not present

## 2019-05-08 DIAGNOSIS — Z0289 Encounter for other administrative examinations: Secondary | ICD-10-CM

## 2019-05-08 NOTE — Progress Notes (Signed)
Patient presents to clinic today for assessment and completion of work forms. She has requested to work remotely from home. Work will grant this if appropriate based on current health and medical history. Patient with history of lung cancer diagnosed in 2002 s/p partial resection of lung. Has been stable since then. Denies current cough or SOB. Notes she does take longer to recuperate from respiratory infection. Is also requesting remote work due to her husbands advanced age and deteriorating health as he would be at much higher risk of complication from [place].Marland Kitchen   Health Maintenance: Immunizations -- UTD Colonoscopy -- UTD Mammogram -- UTD. At [facility] mammography. Have not received records yet. Will request these.   Past Medical History:  Diagnosis Date  . Allergy    Seasonal  . Anemia    History of GI blood loss  . Anxiety   . Arthritis   . Bipolar 1 disorder ([place])   . Colon polyps   . Depression   . Epistaxis    Around 2011 or 2012, required cauterization.   . Esophageal stricture   . GERD (gastroesophageal reflux disease)   . Headache(784.0)   . Hyperlipidemia   . Interstitial cystitis   . Lung cancer ([place]) 2002  . Obesity   . Sleep apnea    Doesn't use a CPAP    Past Surgical History:  Procedure Laterality Date  . BALLOON DILATION  05/16/2012   Procedure: BALLOON DILATION;  Surgeon: Inda Castle, MD;  Location: [place];  Service: Endoscopy;  Laterality: N/A;  . COLONOSCOPY    . ENTEROSCOPY  05/16/2012   Procedure: ENTEROSCOPY;  Surgeon: Inda Castle, MD;  Location: [place];  Service: Endoscopy;  Laterality: N/A;  . JOINT REPLACEMENT    . right shoulder durgery 25 yrs ago    . TOTAL HIP ARTHROPLASTY  2006, 2008   bilateral  . TUBAL LIGATION    . WEDGE RESECTION  2002   lung cancer    Current Outpatient Medications on File Prior to Visit  Medication Sig Dispense Refill  . atorvastatin (LIPITOR) 10 MG tablet Take 10 mg by mouth daily.    .  cetirizine (ZYRTEC) 10 MG tablet Take 10 mg by mouth daily.    Derrill Memo ON 05/13/2019] dexmethylphenidate (FOCALIN) 10 MG tablet Take 1 tablet (10 mg total) by mouth daily. 30 tablet 0  . [START ON 05/13/2019] dexmethylphenidate (FOCALIN) 10 MG tablet Take 1 tablet (10 mg total) by mouth daily. 30 tablet 0  . [START ON 06/10/2019] Dexmethylphenidate HCl 25 MG CP24 Take 25 mg by mouth every morning. 30 capsule 0  . Dexmethylphenidate HCl 25 MG CP24 Take 25 mg by mouth daily. 30 capsule 0  . Dexmethylphenidate HCl 25 MG CP24 Take 25 mg every morning. 30 capsule 0  . EQUETRO 200 MG CP12 12 hr capsule TAKE 1 CAPSULE(200 MG) BY MOUTH AT BEDTIME. STOP THE EQUETRO 300 MG CAPSULE (Patient taking differently: Take 200 mg by mouth at bedtime. ) 60 capsule 3  . ferrous gluconate (FERGON) 324 MG tablet Take 324 mg by mouth daily with breakfast.    . FLUoxetine (PROZAC) 20 MG capsule Take 1 capsule (20 mg total) by mouth daily. 30 capsule 2  . lamoTRIgine (LAMICTAL) 200 MG tablet TAKE 1 TABLET(200 MG) BY MOUTH TWICE DAILY 60 tablet 2  . lithium carbonate 150 MG capsule TAKE 1 CAPSULE(150 MG) BY MOUTH DAILY 90 capsule 1  . Multiple Vitamin (MULTIVITAMIN) tablet Take 1 tablet by mouth  daily.    . omeprazole (PRILOSEC) 20 MG capsule Take 20 mg by mouth at bedtime.    . pantoprazole (PROTONIX) 40 MG tablet Take 40 mg by mouth daily.    Marland Kitchen VRAYLAR capsule TAKE 1 CAPSULE(3 MG) BY MOUTH DAILY 30 capsule 2  . Cholecalciferol 1.25 MG (50000 UT) capsule Take 1 capsule (50,000 Units total) by mouth 3 (three) times a week. (Patient not taking: Reported on 04/05/2019) 12 capsule 5   No current facility-administered medications on file prior to visit.     Allergies  Allergen Reactions  . Azithromycin Anaphylaxis  . Penicillins Anaphylaxis    DID THE REACTION INVOLVE: Swelling of the face/tongue/throat, SOB, or low BP? Yes Sudden or severe rash/hives, skin peeling, or the inside of the mouth or nose? Yes Did it require  medical treatment? No When did it last happen? If all above answers are "NO", may proceed with cephalosporin use.  . Adhesive [Tape] Other (See Comments)    On bandaids    Family History  Problem Relation Age of Onset  . Arthritis Mother   . Hearing loss Mother   . Hyperlipidemia Mother   . Hypertension Mother   . Hypertension Father   . Diabetes Mellitus II Father   . Heart disease Father   . Arthritis Father   . Cancer Father        Brain  . COPD Father   . Diabetes Father   . Hyperlipidemia Father   . Early death Sister        Aneroxia/Bulimic  . Depression Brother   . Early death Investment banker, corporate  . Depression Daughter   . Drug abuse Daughter   . Heart disease Daughter   . Hypertension Daughter   . Stroke Maternal Grandmother   . Hypertension Maternal Grandmother   . Arthritis Maternal Grandfather   . Heart attack Maternal Grandfather   . Hearing loss Maternal Grandfather   . Colon cancer Neg Hx   . Esophageal cancer Neg Hx   . Rectal cancer Neg Hx     Social History   Socioeconomic History  . Marital status: Married    Spouse name: Not on file  . Number of children: 1  . Years of education: Not on file  . Highest education level: Not on file  Occupational History  . Occupation: Admin. assistant  Social Needs  . Financial resource strain: Not on file  . Food insecurity    Worry: Not on file    Inability: Not on file  . Transportation needs    Medical: Not on file    Non-medical: Not on file  Tobacco Use  . Smoking status: Never Smoker  . Smokeless tobacco: Never Used  Substance and Sexual Activity  . Alcohol use: Yes    Alcohol/week: 1.0 standard drinks    Types: 1 Glasses of wine per week    Comment: Moderate  . Drug use: No  . Sexual activity: Not Currently  Lifestyle  . Physical activity    Days per week: Not on file    Minutes per session: Not on file  . Stress: Not on file  Relationships  . Social Product manager on phone: Not on file    Gets together: Not on file    Attends religious service: Not on file    Active member of club or organization: Not on file    Attends meetings of clubs or  organizations: Not on file    Relationship status: Not on file  . Intimate partner violence    Fear of current or ex partner: Not on file    Emotionally abused: Not on file    Physically abused: Not on file    Forced sexual activity: Not on file  Other Topics Concern  . Not on file  Social History Narrative  . Not on file   Review of Systems  Constitutional: Negative for fever and weight loss.  HENT: Negative for ear discharge, ear pain, hearing loss and tinnitus.   Eyes: Negative for blurred vision, double vision, photophobia and pain.  Respiratory: Negative for cough and shortness of breath.   Cardiovascular: Negative for chest pain and palpitations.  Gastrointestinal: Negative for abdominal pain, blood in stool, constipation, diarrhea, heartburn, melena, nausea and vomiting.  Genitourinary: Negative for dysuria, flank pain, frequency, hematuria and urgency.  Musculoskeletal: Negative for falls.  Neurological: Negative for dizziness, loss of consciousness and headaches.  Endo/Heme/Allergies: Negative for environmental allergies.  Psychiatric/Behavioral: Negative for depression, hallucinations, substance abuse and suicidal ideas. The patient is not nervous/anxious and does not have insomnia.   All other systems reviewed and are negative.   Ht 5\' 4"  (1.626 m)   Wt 161 lb (73 kg)   BMI 27.64 kg/m   Physical Exam Vitals signs reviewed.  Constitutional:      Appearance: Normal appearance.  HENT:     Head: Normocephalic and atraumatic.     Right Ear: Tympanic membrane normal.     Left Ear: Tympanic membrane normal.     Nose: Nose normal.     Mouth/Throat:     Mouth: Mucous membranes are moist.  Eyes:     Conjunctiva/sclera: Conjunctivae normal.  Neck:     Musculoskeletal: Neck supple.   Cardiovascular:     Rate and Rhythm: Normal rate and regular rhythm.     Pulses: Normal pulses.     Heart sounds: Normal heart sounds.  Pulmonary:     Effort: Pulmonary effort is normal.     Breath sounds: Normal breath sounds.  Neurological:     Mental Status: She is alert.  Psychiatric:        Mood and Affect: Mood normal.    Recent Results (from the past 2160 hour(s))  CBC with Differential/Platelet     Status: Abnormal   Collection Time: 04/04/19 10:04 AM  Result Value Ref Range   WBC 7.7 4.0 - 10.5 K/uL   RBC 4.52 3.87 - 5.11 Mil/uL   Hemoglobin 13.9 12.0 - 15.0 g/dL   HCT 41.7 36.0 - 46.0 %   MCV 92.2 78.0 - 100.0 fl   MCHC 33.3 30.0 - 36.0 g/dL   RDW 14.4 11.5 - 15.5 %   Platelets 242.0 150.0 - 400.0 K/uL   Neutrophils Relative % 46.9 43.0 - 77.0 %   Lymphocytes Relative 30.3 12.0 - 46.0 %   Monocytes Relative 8.5 3.0 - 12.0 %   Eosinophils Relative 13.3 (H) 0.0 - 5.0 %   Basophils Relative 1.0 0.0 - 3.0 %   Neutro Abs 3.6 1.4 - 7.7 K/uL   Lymphs Abs 2.3 0.7 - 4.0 K/uL   Monocytes Absolute 0.7 0.1 - 1.0 K/uL   Eosinophils Absolute 1.0 (H) 0.0 - 0.7 K/uL   Basophils Absolute 0.1 0.0 - 0.1 K/uL  Comprehensive metabolic panel     Status: Abnormal   Collection Time: 04/04/19 10:04 AM  Result Value Ref Range   Sodium 141 135 -  145 mEq/L   Potassium 4.5 3.5 - 5.1 mEq/L   Chloride 104 96 - 112 mEq/L   CO2 29 19 - 32 mEq/L   Glucose, Bld 88 70 - 99 mg/dL   BUN 28 (H) 6 - 23 mg/dL   Creatinine, Ser 0.83 0.40 - 1.20 mg/dL   Total Bilirubin 0.3 0.2 - 1.2 mg/dL   Alkaline Phosphatase 70 39 - 117 U/L   AST 19 0 - 37 U/L   ALT 13 0 - 35 U/L   Total Protein 7.0 6.0 - 8.3 g/dL   Albumin 4.5 3.5 - 5.2 g/dL   Calcium 9.9 8.4 - 10.5 mg/dL   GFR 69.31 >60.00 mL/min  Hemoglobin A1c     Status: None   Collection Time: 04/04/19 10:04 AM  Result Value Ref Range   Hgb A1c MFr Bld 5.6 4.6 - 6.5 %    Comment: Glycemic Control Guidelines for People with Diabetes:Non Diabetic:   <6%Goal of Therapy: <7%Additional Action Suggested:  >8%   Lipid panel     Status: Abnormal   Collection Time: 04/04/19 10:04 AM  Result Value Ref Range   Cholesterol 241 (H) 0 - 200 mg/dL    Comment: ATP III Classification       Desirable:  < 200 mg/dL               Borderline High:  200 - 239 mg/dL          High:  > = 240 mg/dL   Triglycerides 106.0 0.0 - 149.0 mg/dL    Comment: Normal:  <150 mg/dLBorderline High:  150 - 199 mg/dL   HDL 77.80 >39.00 mg/dL   VLDL 21.2 0.0 - 40.0 mg/dL   LDL Cholesterol 142 (H) 0 - 99 mg/dL   Total CHOL/HDL Ratio 3     Comment:                Men          Women1/2 Average Risk     3.4          3.3Average Risk          5.0          4.42X Average Risk          9.6          7.13X Average Risk          15.0          11.0                       NonHDL 162.95     Comment: NOTE:  Non-HDL goal should be 30 mg/dL higher than patient's LDL goal (i.e. LDL goal of < 70 mg/dL, would have non-HDL goal of < 100 mg/dL)  TSH     Status: None   Collection Time: 04/04/19 10:04 AM  Result Value Ref Range   TSH 1.68 0.35 - 4.50 uIU/mL  Vitamin D (25 hydroxy)     Status: None   Collection Time: 04/04/19 10:04 AM  Result Value Ref Range   VITD >120 ng/mL  Novel Coronavirus, NAA ([facility])     Status: None   Collection Time: 04/23/19 11:57 AM   Specimen: Nasopharyngeal(NP) swabs in vial transport medium  Result Value Ref Range   SARS-CoV-2, NAA Not Detected Not Detected    Comment: This test was developed and its performance characteristics determined by [employer]. This test has not been FDA cleared or approved.  This test has been authorized by FDA under an Emergency Use Authorization (EUA). This test is only authorized for the duration of time the declaration that circumstances exist justifying the authorization of the emergency use of in vitro diagnostic tests for detection of SARS-CoV-2 virus and/or diagnosis of COVID-19 infection under section 564(b)(1) of  the Act, 21 U.S.C. 540JWJ-1(B)(1), unless the authorization is terminated or revoked sooner. When diagnostic testing is negative, the possibility of a false negative result should be considered in the context of a patient's recent exposures and the presence of clinical signs and symptoms consistent with COVID-19. An individual without symptoms of COVID-19 and who is not shedding SARS-CoV-2 virus would expect to have a negative (not detected) result in this assay.   Vitamin D (25 hydroxy)     Status: None   Collection Time: 05/01/19  8:35 AM  Result Value Ref Range   VITD 97.23 30.00 - 100.00 ng/mL    Assessment/Plan: 1. Encounter for completion of form with patient 2. History of lung cancer Agree with her medical history and home situation she and husband are both at higher risk of complications from [place]. As such agree she should work remotely if possible. Forms completed and given to patient.    Leeanne Rio, PA-C

## 2019-05-09 ENCOUNTER — Ambulatory Visit (INDEPENDENT_AMBULATORY_CARE_PROVIDER_SITE_OTHER): Payer: BC Managed Care – PPO | Admitting: Psychiatry

## 2019-05-09 ENCOUNTER — Encounter: Payer: Self-pay | Admitting: Emergency Medicine

## 2019-05-09 DIAGNOSIS — F319 Bipolar disorder, unspecified: Secondary | ICD-10-CM | POA: Diagnosis not present

## 2019-05-09 NOTE — Progress Notes (Signed)
      [facility] Counselor/Therapist Progress Note  Patient ID: Megan Whitney, MRN: [id number],    Date: 05/09/2019  Time Spent: 60 minutes    3:00pm to 4:00pm     Treatment Type: Individual Therapy   Reported Symptoms:  Some mood instability recently and she strongly feels it is related to husband's emotional problems, and issue at work. Anxious, frustration, impatience, some depression, oversensitivity with everybody. Does not appear manic and depression is "more mild".  Denies any SI.  Mental Status Exam:  Appearance:   Neatly dressed    Behavior:  Sharing  Motor:  Still has noticeable tremor in lower right arm and hand and she states Dr is aware; did stop temporarily and then resumed  Speech/Language:  Normal Rate  Affect:  Anxious, "mild depression"  Mood:  Anxious, mild depression  Thought process:  normal  Thought content:    WNL  Sensory/Perceptual disturbances:    WNL  Orientation:  oriented to person, place, time/date, situation, day of week, month of year and year  Attention:  Good  Concentration:  Good  Memory:  WNL  Fund of knowledge:   Good  Insight:    Fair  Judgment:   Fair  Impulse Control:  Fair   Risk Assessment: Danger to Self:  Denies any SI.   Self-injurious Behavior: No Danger to Others: No Duty to Warn:no Physical Aggression / Violence:No  Access to Firearms a concern: No  Gang Involvement:No   Subjective:  Patient in today with more anxiety and depressed mood (mild).  Short-tempered, oversensitivity, frustration, and impatience.  Denies any SI.  Husband's drinking continues to be problematic and a real health issue. Patient concerned for him and "can't make him be honest with his PCP who does know there is an alcohol problem."  Is back working from at her request due to problem with co-worker.  Going into the office occasionally for certain tasks. Processes some of what's going on at work and we talked about her setting some healthier  boundaries with others.  Patient using re-framing of thoughts and slow deep breathing exercises to help with her anxiety and depression.  Staying in touch with her knitting group for social purposes.   Interventions: Solution-Focused and CBT  Diagnosis:   ICD-10-CM   1. Bipolar I disorder with rapid cycling ([place])  F31.9     Plan:   Treatment Goals: Patient not signed updated Goals on computer screen due to [place].  Long Term Goal: Reduce overall level, frequency, and intensity of the anxiety so that daily functioning is not impaired. (Progressing- patient involved in updating of goal plan. Motivated)  Short Term Goal: 1.Increase understanding of the beliefs and messages that produce the worry and anxiety. (Progressing- patient involved in updating goal plan and is motivated.)  Strategies: 1.Help client develop reality-based positive cognitive messages.  2. Develop a "coping card" or other reminder which coping strategies are recorded for patient's later use .   Next appt will be scheduled within 2 weeks.  Shanon Ace, LCSW

## 2019-05-14 ENCOUNTER — Ambulatory Visit: Payer: BC Managed Care – PPO | Admitting: Psychiatry

## 2019-05-27 ENCOUNTER — Ambulatory Visit: Payer: BC Managed Care – PPO | Admitting: Psychiatry

## 2019-06-10 ENCOUNTER — Encounter: Payer: Self-pay | Admitting: Psychiatry

## 2019-06-10 ENCOUNTER — Other Ambulatory Visit: Payer: Self-pay

## 2019-06-10 ENCOUNTER — Ambulatory Visit (INDEPENDENT_AMBULATORY_CARE_PROVIDER_SITE_OTHER): Payer: BC Managed Care – PPO | Admitting: Psychiatry

## 2019-06-10 DIAGNOSIS — G251 Drug-induced tremor: Secondary | ICD-10-CM

## 2019-06-10 DIAGNOSIS — F319 Bipolar disorder, unspecified: Secondary | ICD-10-CM

## 2019-06-10 DIAGNOSIS — F411 Generalized anxiety disorder: Secondary | ICD-10-CM | POA: Diagnosis not present

## 2019-06-10 DIAGNOSIS — F900 Attention-deficit hyperactivity disorder, predominantly inattentive type: Secondary | ICD-10-CM | POA: Diagnosis not present

## 2019-06-10 MED ORDER — DEXMETHYLPHENIDATE HCL 10 MG PO TABS: 10.0000 mg | ORAL_TABLET | Freq: Every day | ORAL | 0 refills | Status: DC

## 2019-06-10 MED ORDER — DEXMETHYLPHENIDATE HCL ER 25 MG PO CP24: ORAL_CAPSULE | ORAL | 0 refills | Status: DC

## 2019-06-10 MED ORDER — DEXMETHYLPHENIDATE HCL ER 25 MG PO CP24: 25.0000 mg | ORAL_CAPSULE | Freq: Every day | ORAL | 0 refills | Status: DC

## 2019-06-10 NOTE — Progress Notes (Signed)
Megan Whitney [id number] 11/27/1955 63 y.o.  Subjective:   Patient ID:  Megan Whitney is a 63 y.o. (DOB 19-Nov-1955) female.   Chief Complaint:  Chief Complaint  Patient presents with  . ADHD  . Follow-up    mood anxiety an dmed management    Depression        Associated symptoms include no decreased concentration and no suicidal ideas.  Past medical history includes anxiety.   Anxiety Symptoms include nervous/anxious behavior. Patient reports no confusion, decreased concentration, dizziness, nausea or suicidal ideas.    Medication Refill Associated symptoms include arthralgias. Pertinent negatives include no nausea or weakness.   lSusan RHANDA Whitney is contacted for follow-up of recent problems with falling and chronic mood swings and anxiety and changes in medications made at the last visit.   At visit December 27, 2018.  Focalin XR was increased from 20 mg to 25 mg daily to help with focus and attention and potentially mood.  When seen February 13, 2019.  In an effort to reduce mood cycling we reduce fluoxetine to 20 mg daily.  Last visit August 2020.  No meds were changed.  She continued the following: Focalin XR 25 mg every morning and Focalin 10 mg immediate release daily Equetro 200 mg nightly Fluoxetine 20 mg daily Lamotrigine 200 mg twice daily Lithium 150 mg nightly Vraylar 3 mg daily   Overall doing real well.  Low level depression 4-5/10 without SI.  That's more depression than the last visit.  Up last night with H and then scared bc someone knocked on her door at night.  This caused her to have crying spells this morning.  Doesn't tolerate insomnia.  Still anxious and impatient but less severe.  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 Sober 15 days after a fall out of the shower and he stopped so far.  Longest time sober in 20 years.    Still working off an on at work or home.  Busy time of year.  1 1/2 year from retirement and worries about layoff.   anxiety 7-10/10 bc breakins  in the neighborhood and irritability.   At visit March 31 because of concerns about falling and possible early tardive dyskinesia mouth movements we reduced the Vraylar from 4.5 to 3 mg daily.  Also because of concerns about a pattern of rapid cycling which had been present we reduced the fluoxetine from 60 mg to 40 mg daily and now down to 20 mg daily.  Successful so far.  No SE noted now except toes constantly move and tremor.  Recent serum vitamin D level was determined to be low at 33.  The goal and chronically depressed patient's is in the 50s if possible.  So her vitamin D was increased on December 4 or thereabouts.  Checked vitamin D level again and this time it was high at 120 and so it was stopped.  She's restarted per PCP at 1000 units daily.  Therapy helping Past Psychiatric Medication Trials: Vrayla 4.5 SE, lithium 150, Equetro, Lamictal 200 twice daily, Latuda 80, Focalin, fluoxetine 60, Trileptal 450, olanzapine, Seroquel, Depakote, risperidone, buspirone, Abilify, ropinirole, amantadine, Sinemet, Ritalin, Artane, Cogentin, trazodone hangover, Ambien hangover, sertraline 100, Wellbutrin history of facial tics, paroxetine cognitive side effects  Review of Systems:  Review of Systems  Gastrointestinal: Negative for nausea.  Musculoskeletal: Positive for arthralgias. Negative for gait problem.  Neurological: Positive for tremors. Negative for dizziness and weakness.       Others see HPI  Psychiatric/Behavioral: Positive for  depression. Negative for agitation, behavioral problems, confusion, decreased concentration, dysphoric mood, hallucinations, self-injury, sleep disturbance and suicidal ideas. The patient is nervous/anxious. The patient is not hyperactive.   No falls since here. Not currently depressed but unable to remove this from the list.  Medications: I have reviewed the patient's current medications.  Current Outpatient Medications  Medication Sig Dispense Refill  .  atorvastatin (LIPITOR) 10 MG tablet Take 10 mg by mouth daily.    . cetirizine (ZYRTEC) 10 MG tablet Take 10 mg by mouth daily.    Marland Kitchen dexmethylphenidate (FOCALIN) 10 MG tablet Take 1 tablet (10 mg total) by mouth daily. 30 tablet 0  . dexmethylphenidate (FOCALIN) 10 MG tablet Take 1 tablet (10 mg total) by mouth daily. 30 tablet 0  . Dexmethylphenidate HCl 25 MG CP24 Take 25 mg by mouth every morning. 30 capsule 0  . Dexmethylphenidate HCl 25 MG CP24 Take 25 mg by mouth daily. 30 capsule 0  . Dexmethylphenidate HCl 25 MG CP24 Take 25 mg every morning. 30 capsule 0  . EQUETRO 200 MG CP12 12 hr capsule TAKE 1 CAPSULE(200 MG) BY MOUTH AT BEDTIME. STOP THE EQUETRO 300 MG CAPSULE (Patient taking differently: Take 200 mg by mouth at bedtime. ) 60 capsule 3  . ferrous gluconate (FERGON) 324 MG tablet Take 324 mg by mouth daily with breakfast.    . FLUoxetine (PROZAC) 20 MG capsule Take 1 capsule (20 mg total) by mouth daily. 30 capsule 2  . lamoTRIgine (LAMICTAL) 200 MG tablet TAKE 1 TABLET(200 MG) BY MOUTH TWICE DAILY 60 tablet 2  . lithium carbonate 150 MG capsule TAKE 1 CAPSULE(150 MG) BY MOUTH DAILY 90 capsule 1  . Multiple Vitamin (MULTIVITAMIN) tablet Take 1 tablet by mouth daily.    Marland Kitchen omeprazole (PRILOSEC) 20 MG capsule Take 20 mg by mouth at bedtime.    . pantoprazole (PROTONIX) 40 MG tablet Take 40 mg by mouth daily.    Marland Kitchen VRAYLAR capsule TAKE 1 CAPSULE(3 MG) BY MOUTH DAILY 30 capsule 2  . Cholecalciferol 1.25 MG (50000 UT) capsule Take 1 capsule (50,000 Units total) by mouth 3 (three) times a week. (Patient not taking: Reported on 06/10/2019) 12 capsule 5   No current facility-administered medications for this visit.     Medication Side Effects: Other: tremor and weight gain.  Dyskinesia appears better  SE bettter than they were.  Allergies:  Allergies  Allergen Reactions  . Azithromycin Anaphylaxis  . Penicillins Anaphylaxis    DID THE REACTION INVOLVE: Swelling of the  face/tongue/throat, SOB, or low BP? Yes Sudden or severe rash/hives, skin peeling, or the inside of the mouth or nose? Yes Did it require medical treatment? No When did it last happen? If all above answers are "NO", may proceed with cephalosporin use.  . Adhesive [Tape] Other (See Comments)    On bandaids    Past Medical History:  Diagnosis Date  . Allergy    Seasonal  . Anemia    History of GI blood loss  . Anxiety   . Arthritis   . Bipolar 1 disorder ([place])   . Colon polyps   . Depression   . Epistaxis    Around 2011 or 2012, required cauterization.   . Esophageal stricture   . GERD (gastroesophageal reflux disease)   . Headache(784.0)   . Hyperlipidemia   . Interstitial cystitis   . Lung cancer ([place]) 2002  . Obesity   . Sleep apnea    Doesn't use a CPAP  Family History  Problem Relation Age of Onset  . Arthritis Mother   . Hearing loss Mother   . Hyperlipidemia Mother   . Hypertension Mother   . Hypertension Father   . Diabetes Mellitus II Father   . Heart disease Father   . Arthritis Father   . Cancer Father        Brain  . COPD Father   . Diabetes Father   . Hyperlipidemia Father   . Early death Sister        Aneroxia/Bulimic  . Depression Brother   . Early death Investment banker, corporate  . Depression Daughter   . Drug abuse Daughter   . Heart disease Daughter   . Hypertension Daughter   . Stroke Maternal Grandmother   . Hypertension Maternal Grandmother   . Arthritis Maternal Grandfather   . Heart attack Maternal Grandfather   . Hearing loss Maternal Grandfather   . Colon cancer Neg Hx   . Esophageal cancer Neg Hx   . Rectal cancer Neg Hx     Social History   Socioeconomic History  . Marital status: Married    Spouse name: Not on file  . Number of children: 1  . Years of education: Not on file  . Highest education level: Not on file  Occupational History  . Occupation: Admin. assistant  Social Needs  . Financial resource  strain: Not on file  . Food insecurity    Worry: Not on file    Inability: Not on file  . Transportation needs    Medical: Not on file    Non-medical: Not on file  Tobacco Use  . Smoking status: Never Smoker  . Smokeless tobacco: Never Used  Substance and Sexual Activity  . Alcohol use: Yes    Alcohol/week: 1.0 standard drinks    Types: 1 Glasses of wine per week    Comment: Moderate  . Drug use: No  . Sexual activity: Not Currently  Lifestyle  . Physical activity    Days per week: Not on file    Minutes per session: Not on file  . Stress: Not on file  Relationships  . Social Herbalist on phone: Not on file    Gets together: Not on file    Attends religious service: Not on file    Active member of club or organization: Not on file    Attends meetings of clubs or organizations: Not on file    Relationship status: Not on file  . Intimate partner violence    Fear of current or ex partner: Not on file    Emotionally abused: Not on file    Physically abused: Not on file    Forced sexual activity: Not on file  Other Topics Concern  . Not on file  Social History Narrative  . Not on file    Past Medical History, Surgical history, Social history, and Family history were reviewed and updated as appropriate.   Please see review of systems for further details on the patient's review from today.   Objective:   Physical Exam:  There were no vitals taken for this visit.  Physical Exam Constitutional:      General: She is not in acute distress.    Appearance: She is well-developed.  Musculoskeletal:        General: No deformity.  Neurological:     Mental Status: She is alert and oriented to person, place,  and time.     Cranial Nerves: No dysarthria.     Motor: Tremor present.     Coordination: Coordination normal.  Psychiatric:        Attention and Perception: Attention and perception normal. She does not perceive auditory or visual hallucinations.         Mood and Affect: Mood is anxious and depressed. Affect is not labile, blunt, angry, tearful or inappropriate.        Speech: Speech normal. Speech is not rapid and pressured or slurred.        Behavior: Behavior normal. Behavior is cooperative.        Thought Content: Thought content normal. Thought content is not paranoid or delusional. Thought content does not include homicidal or suicidal ideation. Thought content does not include homicidal or suicidal plan.        Cognition and Memory: Cognition normal. She exhibits impaired recent memory.        Judgment: Judgment normal.     Comments: Insight fair.     Lab Review:     Component Value Date/Time   NA 141 04/04/2019 1004   K 4.5 04/04/2019 1004   CL 104 04/04/2019 1004   CO2 29 04/04/2019 1004   GLUCOSE 88 04/04/2019 1004   BUN 28 (H) 04/04/2019 1004   CREATININE 0.83 04/04/2019 1004   CALCIUM 9.9 04/04/2019 1004   PROT 7.0 04/04/2019 1004   ALBUMIN 4.5 04/04/2019 1004   AST 19 04/04/2019 1004   ALT 13 04/04/2019 1004   ALKPHOS 70 04/04/2019 1004   BILITOT 0.3 04/04/2019 1004   GFRNONAA >60 10/21/2018 0858   GFRAA >60 10/21/2018 0858       Component Value Date/Time   WBC 7.7 04/04/2019 1004   RBC 4.52 04/04/2019 1004   HGB 13.9 04/04/2019 1004   HCT 41.7 04/04/2019 1004   PLT 242.0 04/04/2019 1004   MCV 92.2 04/04/2019 1004   MCH 29.2 10/21/2018 0858   MCHC 33.3 04/04/2019 1004   RDW 14.4 04/04/2019 1004   LYMPHSABS 2.3 04/04/2019 1004   MONOABS 0.7 04/04/2019 1004   EOSABS 1.0 (H) 04/04/2019 1004   BASOSABS 0.1 04/04/2019 1004  Vitamin D level 33 on 10K units daily on 12/4/`9 Increased to prescription vitamin d 50K units Monday, Wed, Friday.  Rx sent in.   Lithium Lvl  Date Value Ref Range Status  10/21/2018 0.18 (L) 0.60 - 1.20 mmol/L Final    Comment:    Performed at [facility], [address], [place], [place] [address]     No results found for: PHENYTOIN, PHENOBARB, VALPROATE, CBMZ    .res Assessment: Plan:    Bipolar I disorder with rapid cycling ([place])  Attention deficit hyperactivity disorder (ADHD), predominantly inattentive type  Generalized anxiety disorder  Tremor due to multiple drugs  Greater than 50% of 30 min face to face time with patient was spent on counseling and coordination of care. We discussed the following: Camaryn has chronic rapid cycling bipolar disorder which is chronically unstable and has been difficult to control. Still cycling.  The rapid cycling is making it difficult to control frequency of depressive episodes and the anxiety as well.  We have had to reduce mood stabilizer dosages including Vraylar and Equetro and now the higher dose fluoxetine may be contributing to the rapid cycling.    Less neurological problems with reduction in [place].  Maybe reduced Equetro helped that but it was not noticeable last time.  She apparent was having some  parkinsonism as well as other EPS symptoms.  Add managed with Focalin.  Continue Equetro at the current low dose of 200 mg nightly.  But consider increasing it back up if the mood cycling does not improve.  It appears now that the Arman Filter was causing most of the neurologic problems and falling as opposed to the [place].  She is slightly more depressed than last visit but it is not severe and she is functional.  We both agreed no med changes today.  No med changes today. She agrees.  Continue the following Discussed side effects of each medicine. Focalin XR 25 mg every morning and Focalin 10 mg immediate release daily Equetro 200 mg nightly Fluoxetine 20 mg daily Lamotrigine 200 mg twice daily Lithium 150 mg nightly Vraylar 3 mg daily  Discussed potential metabolic side effects associated with atypical antipsychotics, as well as potential risk for movement side effects. Advised pt to contact office if movement side effects occur.  She may be having some mild TD with toe movement.  Disc SE meds  and this is heightened by the complication of necessary polypharmacy.  Rec call police if getting knocks on the door in the middle of the night.  Supportive therapy in terms of dealing with husband's addiction.  This appt was 30 mins.  Requires frequent FU DT chronic instability.  FU 8 weeks.  Lynder Parents MD, DFAPA  Please see After Visit Summary for patient specific instructions.  Future Appointments  Date Time Provider [place]  06/12/2019  4:00 PM Shanon Ace, [place] CP-CP None  06/26/2019  4:00 PM Shanon Ace, LCSW CP-CP None    No orders of the defined types were placed in this encounter.     -------------------------------

## 2019-06-11 ENCOUNTER — Other Ambulatory Visit: Payer: Self-pay | Admitting: Psychiatry

## 2019-06-12 ENCOUNTER — Ambulatory Visit: Payer: BC Managed Care – PPO | Admitting: Psychiatry

## 2019-06-13 ENCOUNTER — Other Ambulatory Visit: Payer: Self-pay

## 2019-06-13 ENCOUNTER — Ambulatory Visit (INDEPENDENT_AMBULATORY_CARE_PROVIDER_SITE_OTHER): Payer: BC Managed Care – PPO | Admitting: Psychiatry

## 2019-06-13 DIAGNOSIS — F319 Bipolar disorder, unspecified: Secondary | ICD-10-CM

## 2019-06-13 NOTE — Progress Notes (Signed)
      [facility] Counselor/Therapist Progress Note  Patient ID: NAMI STRAWDER, MRN: [id number],    Date: 06/13/2019  Time Spent: 60 minutes    11:00am to 12:00noon   Treatment Type: Individual Therapy  Reported Symptoms: anxiety;  since last appt went from "depression to hypomanic" and was tearful however now is feeling better, is feeling encouraged and so grateful that her husband has stopped drinking for past 16 days (after a fall where 911 had to be called)  Mental Status Exam:  Appearance:   Neat     Behavior:  Appropriate and Sharing  Motor:  Normal  Speech/Language:   Normal Rate  Affect:  Appropriate  Mood:  Anxious herself and also if husband remains alcohol free  Thought process:  goal directed  Thought content:    WNL  Sensory/Perceptual disturbances:    WNL  Orientation:  oriented to person, place, time/date, situation, day of week, month of year and year  Attention:  Good  Concentration:  Good  Memory:  patient reports "some forgetfulness with short term memory"  Fund of knowledge:   Good  Insight:    Good  Judgment:   Good  Impulse Control:  Good   Risk Assessment: Danger to Self:  No Self-injurious Behavior: No Danger to Others: No Duty to Warn:no Physical Aggression / Violence:No  Access to Firearms a concern: No  Gang Involvement:No   Subjective: Patient having some work stress and uncertainties, anxiety, and some depression.  Has some good news that husband has quit drinking 16 days.  Wants to stop using food for comfort; emotional eating.  Wants to start exercising again, as that "helps me stay stable."  Interventions: Cognitive Behavioral Therapy and Ego-Supportive  Diagnosis:   ICD-10-CM   1. Bipolar I disorder with rapid cycling ([place])  F31.9      Plan:  Patient not signing tx plan on computer screen due to [place].  Treatment Goals: Patient not signed updated Goals on computer screen due to [place].  Long Term Goal: Reduce overall level,  frequency, and intensity of the anxiety so that daily functioning is not impaired.  Short Term Goal: 1.Increase understanding of the beliefs and messages that produce the worry and anxiety.  Strategies: 1.Help client develop reality-based positive cognitive messages. 2. Develop a "coping card" or other reminder which coping strategies are recorded for patient's later use .  PROGRESS: After processing all that's happened since last appt and husband stopping his drinking for 16 days now, Patient also worked today on her frequency, level, and intensity of her anxiety and reports using deep breathing exercises taught in a previous session, along with some meditation.  "I need to remember to do these more often." Getting better sleep she notices has helped her also.  She is going to continue these strategies and pay closer attention to "triggers to her anxiety so as to catch it earlier" and eventually change some of her thought patterns to be worked on at a future session.  Next appt in 2-3 weeks.   Shanon Ace, LCSW

## 2019-06-26 ENCOUNTER — Ambulatory Visit: Payer: BC Managed Care – PPO | Admitting: Psychiatry

## 2019-07-07 ENCOUNTER — Other Ambulatory Visit: Payer: Self-pay | Admitting: Psychiatry

## 2019-07-08 ENCOUNTER — Ambulatory Visit: Payer: BC Managed Care – PPO | Admitting: Physician Assistant

## 2019-07-10 ENCOUNTER — Encounter: Payer: Self-pay | Admitting: Physician Assistant

## 2019-07-10 ENCOUNTER — Telehealth: Payer: Self-pay

## 2019-07-10 ENCOUNTER — Other Ambulatory Visit: Payer: BC Managed Care – PPO

## 2019-07-10 ENCOUNTER — Ambulatory Visit (INDEPENDENT_AMBULATORY_CARE_PROVIDER_SITE_OTHER): Payer: BC Managed Care – PPO

## 2019-07-10 ENCOUNTER — Other Ambulatory Visit: Payer: Self-pay | Admitting: Radiology

## 2019-07-10 ENCOUNTER — Ambulatory Visit (INDEPENDENT_AMBULATORY_CARE_PROVIDER_SITE_OTHER): Payer: BC Managed Care – PPO | Admitting: Psychiatry

## 2019-07-10 ENCOUNTER — Other Ambulatory Visit: Payer: Self-pay

## 2019-07-10 ENCOUNTER — Ambulatory Visit: Payer: BC Managed Care – PPO | Admitting: Physician Assistant

## 2019-07-10 VITALS — BP 112/70 | HR 87 | Temp 98.5°F | Resp 14 | Ht 64.0 in | Wt 171.0 lb

## 2019-07-10 DIAGNOSIS — M79672 Pain in left foot: Secondary | ICD-10-CM

## 2019-07-10 DIAGNOSIS — F319 Bipolar disorder, unspecified: Secondary | ICD-10-CM | POA: Diagnosis not present

## 2019-07-10 DIAGNOSIS — M19072 Primary osteoarthritis, left ankle and foot: Secondary | ICD-10-CM | POA: Diagnosis not present

## 2019-07-10 DIAGNOSIS — M25572 Pain in left ankle and joints of left foot: Secondary | ICD-10-CM | POA: Diagnosis not present

## 2019-07-10 NOTE — Telephone Encounter (Signed)
Megan Whitney was her to see [name] for counseling this morning and asked to speak with me about some symptoms she's experiencing. She said she feels she's getting hypomanic since time change, she's not sleeping and full of energy all day long. Asking to get something before it gets worse. No change in her sleep routine prior to bed just noticed with time change. Please advise.

## 2019-07-10 NOTE — Progress Notes (Signed)
      [facility] Counselor/Therapist Progress Note  Patient ID: Megan Whitney, MRN: [id number],    Date: 07/10/2019  Time Spent: 60 minutes   9:00am to 10:00am  Treatment Type: Individual Therapy  Reported Symptoms: "my anxiety is through the roof" (pandemic, feeling unsafe and had someone knocked on door at 3:00am recently)  Mental Status Exam:  Appearance:   Casual     Behavior:  Appropriate and Sharing  Motor:  slight tremor off and on in right forearm between wrist and elbow  Speech/Language:   Normal Rate  Affect:  anxious, depressed  Mood:  anxious and depressed  Thought process:  goal directed  Thought content:    WNL  Sensory/Perceptual disturbances:    WNL  Orientation:  oriented to person, place, time/date, situation, day of week, month of year and year  Attention:  Good  Concentration:  Good  Memory:  Pt reports easily forgetting things she is told and is discussing this at her PCP appt today.  Fund of knowledge:   Good  Insight:    Good  Judgment:   Good  Impulse Control:  Fair   Risk Assessment: Danger to Self:  No Self-injurious Behavior: No Danger to Others: No Duty to Warn:no Physical Aggression / Violence:No  Access to Firearms a concern: No  Gang Involvement:No   Subjective:  Patient in today with heightened anxiety, impatient, depression, fears,sleeping less,  "wonders about hypomania but doesn't feel that way."  Is checking in with nurse before she leaves today.  Husband remains alcohol-free.  Interventions: Cognitive Behavioral Therapy and Ego-Supportive  Diagnosis:   ICD-10-CM   1. Bipolar I disorder with rapid cycling ([place])  F31.9      Plan: Patient not signing tx plan on computer screen due to [place].  Treatment Goals: Patient not signed updated Goals on computer screen due to [place].  Long Term Goal: Reduce overall level, frequency, and intensity of the anxiety so that daily functioning is not impaired.  Short Term  Goal: 1.Increase understanding of the beliefs and messages that produce the worry and anxiety.  Strategies: 1.Help client develop reality-based positive cognitive messages. 2. Develop a "coping card" or other reminder which coping strategies are recorded for patient's later use .  PROGRESS: Patient reports she was doing some better with the overall level and frequency of her anxiety.  Was still working to decrease the intensity of anxiety.  "However more recently she's had heightened anxiety due to outside circumstances".  Is wanting to get back on track in working on her anxiety and we discussed that today.  Is practicing the deep breathing exercises and states she needs to increase the use of that, as it helps. Also talked about developing a Coping Card, which we did in session today and reviewed its usefulness.  Also worked together on some additional examples of healthier thoughts versus her anxious thoughts---healthier in terms of being more reality-based and positive.  She is to use Coping Card and these latest examples of more positive thought patterns to empower her.  Goal review and progress noted with patient.   Next appt within 3 weeks.   Shanon Ace, LCSW

## 2019-07-10 NOTE — Patient Instructions (Addendum)
Please talk with Danae Chen at the front desk regarding your x-ray.   Please go to the [facility] office for x-ray. We will call you with your results and alter treatment accordingly.   [address]  [place], [place] [address]  Elevate the leg while resting. Wear supportive lace up shoes daily. Take Extra strength Tylenol for pain.   We will alter regimen according to result.

## 2019-07-10 NOTE — Progress Notes (Signed)
Patient presents to clinic today c/o left ankle pain. Does not recall injury. Endorses swelling x 2 months of left ankle, intermittent and worse in the evenings, improves in the mornings after elevating foot in bed. Intermittent, constant pain over past two weeks. Located around lateral malleolus. Sharp pain with walking, increases with wearing flat shoes. Wears sandals around her house. No pain at rest. Takes tylenol with relief. Denies erythema, feeling cold to touch, numbness or tinging. Denies bruising or erythema. No swelling in any other joints.  No pain at present.    Past Medical History:  Diagnosis Date  . Allergy    Seasonal  . Anemia    History of GI blood loss  . Anxiety   . Arthritis   . Bipolar 1 disorder ([place])   . Colon polyps   . Depression   . Epistaxis    Around 2011 or 2012, required cauterization.   . Esophageal stricture   . GERD (gastroesophageal reflux disease)   . Headache(784.0)   . Hyperlipidemia   . Interstitial cystitis   . Lung cancer ([place]) 2002  . Obesity   . Sleep apnea    Doesn't use a CPAP    Current Outpatient Medications on File Prior to Visit  Medication Sig Dispense Refill  . atorvastatin (LIPITOR) 10 MG tablet Take 10 mg by mouth daily.    . cetirizine (ZYRTEC) 10 MG tablet Take 10 mg by mouth daily.    Marland Kitchen dexmethylphenidate (FOCALIN) 10 MG tablet Take 1 tablet (10 mg total) by mouth daily. 30 tablet 0  . [START ON 08/05/2019] dexmethylphenidate (FOCALIN) 10 MG tablet Take 1 tablet (10 mg total) by mouth daily. 30 tablet 0  . Dexmethylphenidate HCl 25 MG CP24 Take 25 mg by mouth every morning. 30 capsule 0  . Dexmethylphenidate HCl 25 MG CP24 Take 25 mg every morning. 30 capsule 0  . [START ON 08/05/2019] Dexmethylphenidate HCl 25 MG CP24 Take 25 mg by mouth daily. 30 capsule 0  . EQUETRO 200 MG CP12 12 hr capsule TAKE 1 CAPSULE(200 MG) BY MOUTH AT BEDTIME. STOP THE EQUETRO 300 MG CAPSULE (Patient taking differently: Take 200 mg by mouth at  bedtime. ) 60 capsule 3  . ferrous gluconate (FERGON) 324 MG tablet Take 324 mg by mouth daily with breakfast.    . FLUoxetine (PROZAC) 20 MG capsule Take 1 capsule (20 mg total) by mouth daily. 30 capsule 2  . lamoTRIgine (LAMICTAL) 200 MG tablet TAKE 1 TABLET(200 MG) BY MOUTH TWICE DAILY 60 tablet 2  . lithium carbonate 150 MG capsule TAKE 1 CAPSULE(150 MG) BY MOUTH DAILY 90 capsule 1  . Multiple Vitamin (MULTIVITAMIN) tablet Take 1 tablet by mouth daily.    Marland Kitchen omeprazole (PRILOSEC) 20 MG capsule Take 20 mg by mouth at bedtime.    . pantoprazole (PROTONIX) 40 MG tablet Take 40 mg by mouth daily.    Marland Kitchen VRAYLAR capsule TAKE 1 CAPSULE(3 MG) BY MOUTH DAILY (Patient taking differently: Take 3 mg by mouth daily. ) 30 capsule 2  . Cholecalciferol 1.25 MG (50000 UT) capsule Take 1 capsule (50,000 Units total) by mouth 3 (three) times a week. (Patient not taking: Reported on 07/10/2019) 12 capsule 5   No current facility-administered medications on file prior to visit.     Allergies  Allergen Reactions  . Azithromycin Anaphylaxis  . Penicillins Anaphylaxis    DID THE REACTION INVOLVE: Swelling of the face/tongue/throat, SOB, or low BP? Yes Sudden or severe rash/hives, skin peeling,  or the inside of the mouth or nose? Yes Did it require medical treatment? No When did it last happen? If all above answers are "NO", may proceed with cephalosporin use.  . Adhesive [Tape] Other (See Comments)    On bandaids    Family History  Problem Relation Age of Onset  . Arthritis Mother   . Hearing loss Mother   . Hyperlipidemia Mother   . Hypertension Mother   . Hypertension Father   . Diabetes Mellitus II Father   . Heart disease Father   . Arthritis Father   . Cancer Father        Brain  . COPD Father   . Diabetes Father   . Hyperlipidemia Father   . Early death Sister        Aneroxia/Bulimic  . Depression Brother   . Early death Investment banker, corporate  . Depression Daughter   .  Drug abuse Daughter   . Heart disease Daughter   . Hypertension Daughter   . Stroke Maternal Grandmother   . Hypertension Maternal Grandmother   . Arthritis Maternal Grandfather   . Heart attack Maternal Grandfather   . Hearing loss Maternal Grandfather   . Colon cancer Neg Hx   . Esophageal cancer Neg Hx   . Rectal cancer Neg Hx     Social History   Socioeconomic History  . Marital status: Married    Spouse name: Not on file  . Number of children: 1  . Years of education: Not on file  . Highest education level: Not on file  Occupational History  . Occupation: Admin. assistant  Social Needs  . Financial resource strain: Not on file  . Food insecurity    Worry: Not on file    Inability: Not on file  . Transportation needs    Medical: Not on file    Non-medical: Not on file  Tobacco Use  . Smoking status: Never Smoker  . Smokeless tobacco: Never Used  Substance and Sexual Activity  . Alcohol use: Yes    Alcohol/week: 1.0 standard drinks    Types: 1 Glasses of wine per week    Comment: Moderate  . Drug use: No  . Sexual activity: Not Currently  Lifestyle  . Physical activity    Days per week: Not on file    Minutes per session: Not on file  . Stress: Not on file  Relationships  . Social Herbalist on phone: Not on file    Gets together: Not on file    Attends religious service: Not on file    Active member of club or organization: Not on file    Attends meetings of clubs or organizations: Not on file    Relationship status: Not on file  Other Topics Concern  . Not on file  Social History Narrative  . Not on file   Review of Systems - See HPI.  All other ROS are negative.  BP 112/70   Pulse 87   Temp 98.5 F (36.9 C) (Temporal)   Resp 14   Ht 5\' 4"  (1.626 m)   Wt 171 lb (77.6 kg)   SpO2 97%   BMI 29.35 kg/m   Physical Exam Vitals signs reviewed.  Constitutional:      Appearance: Normal appearance.  HENT:     Head: Normocephalic and  atraumatic.  Neck:     Musculoskeletal: Neck supple.  Musculoskeletal:  Left ankle: She exhibits normal range of motion, no ecchymosis, no deformity, no laceration and normal pulse. No lateral malleolus, no medial malleolus and no proximal fibula tenderness found. Achilles tendon normal. Achilles tendon exhibits no pain.     Comments: Some tenderness noted with palpation over distal anterior L shin on examination. No calf tenderness or pain in calf with dorsiflexion of foot. No pain in foot or ankle with dorsiflexion or plantarflexion.  Neurological:     Mental Status: She is alert. Mental status is at baseline.  Psychiatric:        Mood and Affect: Mood normal.    Recent Results (from the past 2160 hour(s))  Novel Coronavirus, NAA ([facility])     Status: None   Collection Time: 04/23/19 11:57 AM   Specimen: Nasopharyngeal(NP) swabs in vial transport medium  Result Value Ref Range   SARS-CoV-2, NAA Not Detected Not Detected    Comment: This test was developed and its performance characteristics determined by [employer]. This test has not been FDA cleared or approved. This test has been authorized by FDA under an Emergency Use Authorization (EUA). This test is only authorized for the duration of time the declaration that circumstances exist justifying the authorization of the emergency use of in vitro diagnostic tests for detection of SARS-CoV-2 virus and/or diagnosis of COVID-19 infection under section 564(b)(1) of the Act, 21 U.S.C. 235TIR-4(E)(3), unless the authorization is terminated or revoked sooner. When diagnostic testing is negative, the possibility of a false negative result should be considered in the context of a patient's recent exposures and the presence of clinical signs and symptoms consistent with COVID-19. An individual without symptoms of COVID-19 and who is not shedding SARS-CoV-2 virus would expect to have a negative (not detected) result in this assay.    Vitamin D (25 hydroxy)     Status: None   Collection Time: 05/01/19  8:35 AM  Result Value Ref Range   VITD 97.23 30.00 - 100.00 ng/mL  [facility] MAMMOGRAPHY     Status: None   Collection Time: 05/03/19 12:00 AM  Result Value Ref Range   [facility] Mammogram 0-4 Bi-Rad 0-4 Bi-Rad, Self Reported Normal   Assessment/Plan: 1. Left foot pain Atraumatic. History of heel spurs but current pain around lateral malleolus and anterior distal shin. Seems to be a component of tendonitis giving swelling and soft tissue tenderness. No bony tenderness or abnormality on exam. Will check x-ray today to rule out stress fracture due to deep pain with weightbearing. Elevate foot while resting. Patient intolerant to NSAIDS (GERD and history of GI bleed). Can start ES Tylenol OTC. Wear supportive lace-up footwear. Will alter regimen according to findings on imaging and response to conservative treatment.   - DG Foot Complete Left; Future - DG Ankle Complete Left; Future   Leeanne Rio, PA-C

## 2019-07-11 ENCOUNTER — Other Ambulatory Visit: Payer: Self-pay | Admitting: Emergency Medicine

## 2019-07-11 DIAGNOSIS — M79672 Pain in left foot: Secondary | ICD-10-CM

## 2019-07-15 NOTE — Telephone Encounter (Signed)
Rtc to Megan Whitney and apologized for our delay. Instructed to increase her Equetro to 300 mg. She's going to see if she already has some at home due to being on this dose previously. Will call back if needs samples.

## 2019-07-15 NOTE — Telephone Encounter (Signed)
Please apologize for my oversight of this message.  My previous note indicated that if she started having more mood swings problems we would increase the Equetro from 200 mg nightly to 300 mg nightly.  She can pick up some samples of 100 mg capsules if she wishes or we can send in a prescription for 300 mg Equetro 1 nightly.

## 2019-07-17 ENCOUNTER — Ambulatory Visit (INDEPENDENT_AMBULATORY_CARE_PROVIDER_SITE_OTHER): Payer: BC Managed Care – PPO | Admitting: Podiatry

## 2019-07-17 ENCOUNTER — Other Ambulatory Visit: Payer: Self-pay | Admitting: Podiatry

## 2019-07-17 ENCOUNTER — Encounter: Payer: Self-pay | Admitting: Podiatry

## 2019-07-17 ENCOUNTER — Other Ambulatory Visit: Payer: Self-pay

## 2019-07-17 ENCOUNTER — Ambulatory Visit (INDEPENDENT_AMBULATORY_CARE_PROVIDER_SITE_OTHER): Payer: BC Managed Care – PPO

## 2019-07-17 ENCOUNTER — Ambulatory Visit: Payer: BC Managed Care – PPO

## 2019-07-17 VITALS — BP 102/67 | HR 76 | Resp 16

## 2019-07-17 DIAGNOSIS — S82892A Other fracture of left lower leg, initial encounter for closed fracture: Secondary | ICD-10-CM

## 2019-07-17 DIAGNOSIS — M25572 Pain in left ankle and joints of left foot: Secondary | ICD-10-CM

## 2019-07-17 DIAGNOSIS — M069 Rheumatoid arthritis, unspecified: Secondary | ICD-10-CM

## 2019-07-17 DIAGNOSIS — M1990 Unspecified osteoarthritis, unspecified site: Secondary | ICD-10-CM | POA: Diagnosis not present

## 2019-07-17 NOTE — Progress Notes (Signed)
   Subjective:    Patient ID: Megan Whitney, female    DOB: 02/16/1956, 63 y.o.   MRN: [id number]  HPI    Review of Systems  All other systems reviewed and are negative.      Objective:   Physical Exam        Assessment & Plan:

## 2019-07-17 NOTE — Progress Notes (Signed)
Subjective:   Patient ID: Megan Whitney, female   DOB: 63 y.o.   MRN: [id number]   HPI Patient points to the left ankle states that it is been sore and states that it swells worse in the evening its been going on for 2 months.  Patient had an injury in February to her right pelvis but does not think it is involved and has no other specific injury pattern.  Patient does not smoke likes to be active   Review of Systems  All other systems reviewed and are negative.       Objective:  Physical Exam Vitals signs and nursing note reviewed.  Constitutional:      Appearance: She is well-developed.  Pulmonary:     Effort: Pulmonary effort is normal.  Musculoskeletal: Normal range of motion.  Skin:    General: Skin is warm.  Neurological:     Mental Status: She is alert.     Neurovascular status intact muscle strength found to be adequate with exquisite discomfort to the dorsal aspect of the left ankle especially around the fibula with inflammation pain upon palpation.  Patient is noted to have good digital perfusion well oriented x3 with mild reduction of motion which appears to be due to splinting     Assessment:  Possibility for fracture of the fibula left of the stress fracture nature or other pathology secondary to the discomfort this patient is experiencing of 48-month history     Plan:  H&P reviewed foot and ankle x-rays and at this time placed into air fracture walker to immobilize along with ice and advised that we may need to get an MRI of this if symptoms persist or possible CT  X-rays indicate there may be a cyst or a possible stress fracture of the fibula

## 2019-07-18 ENCOUNTER — Encounter: Payer: Self-pay | Admitting: Podiatry

## 2019-07-22 NOTE — Telephone Encounter (Signed)
If still hurting next week

## 2019-07-24 ENCOUNTER — Other Ambulatory Visit: Payer: Self-pay

## 2019-07-24 ENCOUNTER — Telehealth: Payer: Self-pay | Admitting: Psychiatry

## 2019-07-24 MED ORDER — EQUETRO 300 MG PO CP12: 300.0000 mg | ORAL_CAPSULE | Freq: Every day | ORAL | 1 refills | Status: DC

## 2019-07-24 NOTE — Telephone Encounter (Signed)
Pt has been on Equetro 300mg  and thinks it is working well. She would like a rx for it sent in at [facility] in on [address] in [place], [place].

## 2019-07-24 NOTE — Telephone Encounter (Signed)
Updated Rx submitted for Equetro 300 mg

## 2019-08-07 ENCOUNTER — Ambulatory Visit: Payer: BC Managed Care – PPO | Admitting: Psychiatry

## 2019-08-12 ENCOUNTER — Ambulatory Visit (INDEPENDENT_AMBULATORY_CARE_PROVIDER_SITE_OTHER): Payer: BC Managed Care – PPO | Admitting: Psychiatry

## 2019-08-12 ENCOUNTER — Encounter: Payer: Self-pay | Admitting: Psychiatry

## 2019-08-12 DIAGNOSIS — F319 Bipolar disorder, unspecified: Secondary | ICD-10-CM

## 2019-08-12 DIAGNOSIS — F900 Attention-deficit hyperactivity disorder, predominantly inattentive type: Secondary | ICD-10-CM | POA: Diagnosis not present

## 2019-08-12 DIAGNOSIS — F411 Generalized anxiety disorder: Secondary | ICD-10-CM

## 2019-08-12 DIAGNOSIS — R296 Repeated falls: Secondary | ICD-10-CM

## 2019-08-12 MED ORDER — CARBAMAZEPINE ER 200 MG PO CP12: 200.0000 mg | ORAL_CAPSULE | Freq: Every day | ORAL | 1 refills | Status: DC

## 2019-08-12 MED ORDER — DEXMETHYLPHENIDATE HCL ER 25 MG PO CP24: 25.0000 mg | ORAL_CAPSULE | Freq: Every day | ORAL | 0 refills | Status: DC

## 2019-08-12 MED ORDER — CARBAMAZEPINE 100 MG PO CHEW: 100.0000 mg | CHEWABLE_TABLET | Freq: Every day | ORAL | 1 refills | Status: DC

## 2019-08-12 MED ORDER — DEXMETHYLPHENIDATE HCL 10 MG PO TABS: 10.0000 mg | ORAL_TABLET | Freq: Every day | ORAL | 0 refills | Status: DC

## 2019-08-12 NOTE — Progress Notes (Signed)
Megan Whitney [id number] Oct 14, 1955 63 y.o.   Virtual Visit via Telephone Note  I connected with pt by telephone and verified that I am speaking with the correct person using two identifiers.   I discussed the limitations, risks, security and privacy concerns of performing an evaluation and management service by telephone and the availability of in person appointments. I also discussed with the patient that there may be a patient responsible charge related to this service. The patient expressed understanding and agreed to proceed.  I discussed the assessment and treatment plan with the patient. The patient was provided an opportunity to ask questions and all were answered. The patient agreed with the plan and demonstrated an understanding of the instructions.   The patient was advised to call back or seek an in-person evaluation if the symptoms worsen or if the condition fails to improve as anticipated.  I provided for minutes of non-face-to-face time during this encounter. The call started at 510 and ended at 550. The patient was located at car and the provider was located home.   Subjective:   Patient ID:  Megan Whitney is a 63 y.o. (DOB May 02, 1956) female.   Chief Complaint:  Chief Complaint  Patient presents with  . Follow-up    mood and med changes    Depression        Associated symptoms include no decreased concentration and no suicidal ideas.  Past medical history includes anxiety.   Anxiety Symptoms include nervous/anxious behavior. Patient reports no confusion, decreased concentration, dizziness, nausea or suicidal ideas.    Medication Refill Associated symptoms include arthralgias. Pertinent negatives include no nausea or weakness.   Megan Whitney is contacted for follow-up of recent problems with falling and chronic mood swings and anxiety and changes in medications made at the last visit.   At visit December 27, 2018.  Focalin XR was increased from 20 mg to 25  mg daily to help with focus and attention and potentially mood.  When seen February 13, 2019.  In an effort to reduce mood cycling we reduce fluoxetine to 20 mg daily.  At visit August 2020.  No meds were changed.  She continued the following: No med changes today. She agrees.  Continue the following Discussed side effects of each medicine. Focalin XR 25 mg every morning and Focalin 10 mg immediate release daily Equetro 200 mg nightly Fluoxetine 20 mg daily Lamotrigine 200 mg twice daily Lithium 150 mg nightly Vraylar 3 mg daily  She called back November 4 after seeing her therapist stating that she was having some hypomanic symptoms with reduced sleep and increased energy.  This potentiality had been discussed and the decision was made to increase Equetro from 200 mg nightly to 300 mg nightly.  Has noted more balance problems but smoothed out her mood swings. Balance doesn't vary with time of day.  Mood has been a lot better and pretty even despite a lot of stress at home.  Not depressed which is a plus this time of year.  Sleep erratic with initial and terminal and sleeps more on weekends sleeps late.  Doesn't tolerate insomnia.  Still anxious and impatient but less severe.  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 Sober 15 days after a fall out of the shower and he stopped so far.  Longest time sober in 20 years.    Still working off an on at work or home.  Busy time of year.  1 1/2 year from retirement and worries about layoff.  anxiety 7-10/10 bc breakins in the neighborhood and irritability.   At visit March 31 because of concerns about falling and possible early tardive dyskinesia mouth movements we reduced the Vraylar from 4.5 to 3 mg daily.  Also because of concerns about a pattern of rapid cycling which had been present we reduced the fluoxetine from 60 mg to 40 mg daily and now down to 20 mg daily.  Successful so far.  No SE noted now except toes constantly move and tremor.  Recent serum vitamin D  level was determined to be low at 33.  The goal and chronically depressed patient's is in the 50s if possible.  So her vitamin D was increased on December 4 or thereabouts.  Checked vitamin D level again and this time it was high at 120 and so it was stopped.  She's restarted per PCP at 1000 units daily.  Therapy helping Past Psychiatric Medication Trials: Vrayla 4.5 SE, lithium 150, Equetro 300 balance issues, Lamictal 200 twice daily, Latuda 80, Focalin, fluoxetine 60, Trileptal 450, olanzapine, Seroquel, Depakote, risperidone, buspirone, Abilify, ropinirole, amantadine, Sinemet, Ritalin, Artane, Cogentin, trazodone hangover, Ambien hangover, sertraline 100, Wellbutrin history of facial tics, paroxetine cognitive side effects  Review of Systems:  Review of Systems  Gastrointestinal: Negative for nausea.  Musculoskeletal: Positive for arthralgias. Negative for gait problem.  Neurological: Positive for tremors. Negative for dizziness and weakness.       Others see HPI  Psychiatric/Behavioral: Positive for depression. Negative for agitation, behavioral problems, confusion, decreased concentration, dysphoric mood, hallucinations, self-injury, sleep disturbance and suicidal ideas. The patient is nervous/anxious. The patient is not hyperactive.   No falls since here. Not currently depressed but unable to remove this from the list.  Medications: I have reviewed the patient's current medications.  Current Outpatient Medications  Medication Sig Dispense Refill  . atorvastatin (LIPITOR) 10 MG tablet Take 10 mg by mouth daily.    . cetirizine (ZYRTEC) 10 MG tablet Take 10 mg by mouth daily.    . Cholecalciferol (VITAMIN D3 PO) Take 1,000 tablets by mouth daily.    Marland Kitchen dexmethylphenidate (FOCALIN) 10 MG tablet Take 1 tablet (10 mg total) by mouth daily. 30 tablet 0  . dexmethylphenidate (FOCALIN) 10 MG tablet Take 1 tablet (10 mg total) by mouth daily. 30 tablet 0  . Dexmethylphenidate HCl 25 MG CP24  Take 25 mg by mouth daily. 30 capsule 0  . ferrous gluconate (FERGON) 324 MG tablet Take 324 mg by mouth daily with breakfast.    . FLUoxetine (PROZAC) 20 MG capsule Take 1 capsule (20 mg total) by mouth daily. 30 capsule 2  . lamoTRIgine (LAMICTAL) 200 MG tablet TAKE 1 TABLET(200 MG) BY MOUTH TWICE DAILY 60 tablet 2  . lithium carbonate 150 MG capsule TAKE 1 CAPSULE(150 MG) BY MOUTH DAILY 90 capsule 1  . Multiple Vitamin (MULTIVITAMIN) tablet Take 1 tablet by mouth daily.    Marland Kitchen omeprazole (PRILOSEC) 20 MG capsule Take 20 mg by mouth at bedtime.    . pantoprazole (PROTONIX) 40 MG tablet Take 40 mg by mouth daily.    Marland Kitchen VRAYLAR capsule TAKE 1 CAPSULE(3 MG) BY MOUTH DAILY (Patient taking differently: Take 3 mg by mouth daily. ) 30 capsule 2  . carbamazepine (EQUETRO) 200 MG CP12 12 hr capsule Take 1 capsule (200 mg total) by mouth at bedtime. 30 capsule 1  . carbamazepine (TEGRETOL) 100 MG chewable tablet Chew 1 tablet (100 mg total) by mouth at bedtime. 30 tablet 1   No  current facility-administered medications for this visit.     Medication Side Effects: Other: tremor and weight gain.  Dyskinesia appears better  SE bettter than they were.  Allergies:  Allergies  Allergen Reactions  . Azithromycin Anaphylaxis  . Penicillins Anaphylaxis    DID THE REACTION INVOLVE: Swelling of the face/tongue/throat, SOB, or low BP? Yes Sudden or severe rash/hives, skin peeling, or the inside of the mouth or nose? Yes Did it require medical treatment? No When did it last happen? If all above answers are "NO", may proceed with cephalosporin use.  . Adhesive [Tape] Other (See Comments)    On bandaids    Past Medical History:  Diagnosis Date  . Allergy    Seasonal  . Anemia    History of GI blood loss  . Anxiety   . Arthritis   . Bipolar 1 disorder ([place])   . Colon polyps   . Depression   . Epistaxis    Around 2011 or 2012, required cauterization.   . Esophageal stricture   . GERD  (gastroesophageal reflux disease)   . Headache(784.0)   . Hyperlipidemia   . Interstitial cystitis   . Lung cancer ([place]) 2002  . Obesity   . Sleep apnea    Doesn't use a CPAP    Family History  Problem Relation Age of Onset  . Arthritis Mother   . Hearing loss Mother   . Hyperlipidemia Mother   . Hypertension Mother   . Hypertension Father   . Diabetes Mellitus II Father   . Heart disease Father   . Arthritis Father   . Cancer Father        Brain  . COPD Father   . Diabetes Father   . Hyperlipidemia Father   . Early death Sister        Aneroxia/Bulimic  . Depression Brother   . Early death Investment banker, corporate  . Depression Daughter   . Drug abuse Daughter   . Heart disease Daughter   . Hypertension Daughter   . Stroke Maternal Grandmother   . Hypertension Maternal Grandmother   . Arthritis Maternal Grandfather   . Heart attack Maternal Grandfather   . Hearing loss Maternal Grandfather   . Colon cancer Neg Hx   . Esophageal cancer Neg Hx   . Rectal cancer Neg Hx     Social History   Socioeconomic History  . Marital status: Married    Spouse name: Not on file  . Number of children: 1  . Years of education: Not on file  . Highest education level: Not on file  Occupational History  . Occupation: Admin. assistant  Social Needs  . Financial resource strain: Not on file  . Food insecurity    Worry: Not on file    Inability: Not on file  . Transportation needs    Medical: Not on file    Non-medical: Not on file  Tobacco Use  . Smoking status: Never Smoker  . Smokeless tobacco: Never Used  Substance and Sexual Activity  . Alcohol use: Yes    Alcohol/week: 1.0 standard drinks    Types: 1 Glasses of wine per week    Comment: Moderate  . Drug use: No  . Sexual activity: Not Currently  Lifestyle  . Physical activity    Days per week: Not on file    Minutes per session: Not on file  . Stress: Not on file  Relationships  . Social  connections     Talks on phone: Not on file    Gets together: Not on file    Attends religious service: Not on file    Active member of club or organization: Not on file    Attends meetings of clubs or organizations: Not on file    Relationship status: Not on file  . Intimate partner violence    Fear of current or ex partner: Not on file    Emotionally abused: Not on file    Physically abused: Not on file    Forced sexual activity: Not on file  Other Topics Concern  . Not on file  Social History Narrative  . Not on file    Past Medical History, Surgical history, Social history, and Family history were reviewed and updated as appropriate.   Please see review of systems for further details on the patient's review from today.   Objective:   Physical Exam:  There were no vitals taken for this visit.  Physical Exam Constitutional:      General: She is not in acute distress.    Appearance: She is well-developed.  Musculoskeletal:        General: No deformity.  Neurological:     Mental Status: She is alert and oriented to person, place, and time.     Cranial Nerves: No dysarthria.     Motor: Tremor present.     Coordination: Coordination normal.  Psychiatric:        Attention and Perception: Attention and perception normal. She does not perceive auditory or visual hallucinations.        Mood and Affect: Mood is anxious and depressed. Affect is not labile, blunt, angry, tearful or inappropriate.        Speech: Speech normal. Speech is not rapid and pressured or slurred.        Behavior: Behavior normal. Behavior is cooperative.        Thought Content: Thought content normal. Thought content is not paranoid or delusional. Thought content does not include homicidal or suicidal ideation. Thought content does not include homicidal or suicidal plan.        Cognition and Memory: Cognition normal. She exhibits impaired recent memory.        Judgment: Judgment normal.     Comments: Insight fair.      Lab Review:     Component Value Date/Time   NA 141 04/04/2019 1004   K 4.5 04/04/2019 1004   CL 104 04/04/2019 1004   CO2 29 04/04/2019 1004   GLUCOSE 88 04/04/2019 1004   BUN 28 (H) 04/04/2019 1004   CREATININE 0.83 04/04/2019 1004   CALCIUM 9.9 04/04/2019 1004   PROT 7.0 04/04/2019 1004   ALBUMIN 4.5 04/04/2019 1004   AST 19 04/04/2019 1004   ALT 13 04/04/2019 1004   ALKPHOS 70 04/04/2019 1004   BILITOT 0.3 04/04/2019 1004   GFRNONAA >60 10/21/2018 0858   GFRAA >60 10/21/2018 0858       Component Value Date/Time   WBC 7.7 04/04/2019 1004   RBC 4.52 04/04/2019 1004   HGB 13.9 04/04/2019 1004   HCT 41.7 04/04/2019 1004   PLT 242.0 04/04/2019 1004   MCV 92.2 04/04/2019 1004   MCH 29.2 10/21/2018 0858   MCHC 33.3 04/04/2019 1004   RDW 14.4 04/04/2019 1004   LYMPHSABS 2.3 04/04/2019 1004   MONOABS 0.7 04/04/2019 1004   EOSABS 1.0 (H) 04/04/2019 1004   BASOSABS 0.1 04/04/2019 1004  Vitamin D level 33  on 10K units daily on 12/4/`9 Increased to prescription vitamin d 50K units Monday, Wed, Friday.  Rx sent in.   Lithium Lvl  Date Value Ref Range Status  10/21/2018 0.18 (L) 0.60 - 1.20 mmol/L Final    Comment:    Performed at [facility], [address] [place], [place] [address]     No results found for: PHENYTOIN, PHENOBARB, VALPROATE, CBMZ   .res Assessment: Plan:    Bipolar I disorder with rapid cycling ([place]) - Plan: carbamazepine (EQUETRO) 200 MG CP12 12 hr capsule, carbamazepine (TEGRETOL) 100 MG chewable tablet  Attention deficit hyperactivity disorder (ADHD), predominantly inattentive type - Plan: Dexmethylphenidate HCl 25 MG CP24, dexmethylphenidate (FOCALIN) 10 MG tablet  Generalized anxiety disorder  Falling  Greater than 50% of 30 min face to face time with patient was spent on counseling and coordination of care. We discussed the following: [place] has chronic rapid cycling bipolar disorder which is chronically unstable and has been difficult  to control.  The rapid cycling is making it difficult to control frequency of depressive episodes and the anxiety as well.  We have had to reduce mood stabilizer dosages including Vraylar and Equetro and and then reduced fluoxetine which may be contributing to the rapid cycling.    Since last year when she was stable she started having hypomanic symptoms again.  We increased Equetro back to 300 mg nightly but she she has had balance problems.  It has stopped the manic symptoms. Less neurological problems with reduction in [place].  But is noted she has had balance problems though no actual falls with increase in Equetro back to 300 mg daily.  It could have been her prior neurologic symptoms were related to the combination of medications. Reviewed her medication history and it does not appear she is taking any form of short acting carbamazepine.  We could buy the short acting and long-acting carbamazepine in order to try to reduce the side effects but still keep benefit.  Add managed with Focalin.  She has a high residual anxiety.  It has been impossible to control all of her symptoms simultaneously without causing side effects.  No med changes today. She agrees.  Continue the following Discussed side effects of each medicine. Focalin XR 25 mg every morning and Focalin 10 mg immediate release daily Equetro 200 mg nightly Fluoxetine 20 mg daily Lamotrigine 200 mg twice daily Lithium 150 mg nightly Vraylar 3 mg daily  Add CBZ 100 mg IR at night with Equetro 200 to reduce daytime balance problems.  Disc risk worsening cycling and SE in the AM.  Discussed potential metabolic side effects associated with atypical antipsychotics, as well as potential risk for movement side effects. Advised pt to contact office if movement side effects occur.  She may be having some mild TD with toe movement.  Disc SE meds and this is heightened by the complication of necessary polypharmacy.  Rec call police if  getting knocks on the door in the middle of the night.  Supportive therapy in terms of dealing with husband's addiction.  Requires frequent FU DT chronic instability.  FU 6 weeks.  Lynder Parents MD, DFAPA  Please see After Visit Summary for patient specific instructions.  Future Appointments  Date Time Provider [place]  08/14/2019  2:15 PM Wallene Huh, DPM [facility]  08/21/2019  9:00 AM Shanon Ace, LCSW [facility] None  09/04/2019  9:00 AM Shanon Ace, LCSW [facility] None    No orders of the defined  types were placed in this encounter.     -------------------------------

## 2019-08-13 ENCOUNTER — Other Ambulatory Visit: Payer: Self-pay

## 2019-08-13 DIAGNOSIS — Z20822 Contact with and (suspected) exposure to covid-19: Secondary | ICD-10-CM

## 2019-08-14 ENCOUNTER — Ambulatory Visit: Payer: BC Managed Care – PPO | Admitting: Podiatry

## 2019-08-15 LAB — NOVEL CORONAVIRUS, NAA: SARS-CoV-2, NAA: NOT DETECTED

## 2019-08-21 ENCOUNTER — Ambulatory Visit (INDEPENDENT_AMBULATORY_CARE_PROVIDER_SITE_OTHER): Payer: BC Managed Care – PPO | Admitting: Psychiatry

## 2019-08-21 DIAGNOSIS — F319 Bipolar disorder, unspecified: Secondary | ICD-10-CM

## 2019-08-21 NOTE — Progress Notes (Signed)
[facility] Counselor/Therapist Progress Note  Patient ID: Megan Whitney, MRN: [id number],    Date: 08/21/2019  Time Spent: 60 minutes 9:00am to 10:00am   Virtual Visit with Video Note: Connected with patient by a video enabled telemedicine/telehealth application or telephone, with their informed consent, and verified patient privacy and that I am speaking with the correct person using two identifiers. I discussed the limitations, risks, security and privacy concerns of performing psychotherapy and management service by telephone and the availability of in person appointments. I also discussed with the patient that there may be a patient responsible charge related to this service. The patient expressed understanding and agreed to proceed. I discussed the treatment planning with the patient. The patient was provided an opportunity to ask questions and all were answered. The patient agreed with the plan and demonstrated an understanding of the instructions. The patient was advised to call  our office if  symptoms worsen or feel they are in a crisis state and need immediate contact.   Therapist Location: [facility] Patient Location: home  Treatment Type: Individual Therapy  Reported Symptoms:  anxiety, frustration, stressed, depression, short-tempered  Mental Status Exam:  Appearance:   Casual     Behavior:  Appropriate and Sharing  Motor:  Normal  Speech/Language:   Normal Rate  Affect:  anxious, anger, depression, short-tempered, some tearfulness  Mood:  angry, anxious and depressed  Thought process:  goal directed  Thought content:    WNL  Sensory/Perceptual disturbances:    WNL  Orientation:  oriented to person, place, time/date, situation, day of week, month of year and year  Attention:  Good  Concentration:  Good  Memory:  WNL  Fund of knowledge:   Good  Insight:    Good  Judgment:   Good  Impulse Control:  Fair   Risk Assessment: Danger to Self:   No Self-injurious Behavior: No Danger to Others: No Duty to Warn:no Physical Aggression / Violence:No  Access to Firearms a concern: No  Gang Involvement:No   Subjective: Patient today reporting increased symptoms above.  Has tried to be supportive of her daughter since daughter's bio father is in sick in hospital for [facility] in [place].  Patient states she got overly emotionally involved with that situation and had negative impact on patient. Work is also very stressful. Patient having difficulty managing multiple stressors.   Interventions: Cognitive Behavioral Therapy and Solution-Oriented/Positive Psychology  Diagnosis:   ICD-10-CM   1. Bipolar I disorder with rapid cycling ([place])  F31.9      Plan: Patient not signing tx plan on computer screen due to [place].  Treatment Goals: Patient not signed updated Goals on computer screen due to [place].  Long Term Goal: Reduce overall level, frequency, and intensity of the anxiety so that daily functioning is not impaired.  Short Term Goal: 1.Increase understanding of the beliefs and messages that produce the worry and anxiety.  Strategies: 1.Help client develop reality-based positive cognitive messages. 2. Develop a "coping card" or other reminder which coping strategies are recorded for patient's later use .  PROGRESS: Patient continues working on her goals especially related to the frequency and intensity of her anxiety. Has been, and is, in midst of very stressful time with issues at home and work.  Husband had stopped drinking but has returned to drinking within past week. Patient still working to interrupt anxious/negative/depressive thoughts and replace with more reality-based, positive, empowering thought patterns. Patient needed to vent a lot of her  anxiety, frustration, anger, depression, and stress in session today and actually well in giving voice to all of these feelings. Also worked on her quicker inteception of  anxious/negative/depressive thoughts and replacing them with more reality-based, positive, empowering thoughts.  Encouraged her to also be using her "coping card" we did in prior session, her slow-paced deep breathing exercises that have proven to be helpful before, as well as some of her craftwork that she enjoys.  She has begun reading a book on  Meditation for people diagnosed with bipolar disorder and thinks that may help her as well. Much calmer at end of session. Because she has had prior SI in recent days although not current, and she continues to deny any SI, I still explained to her the procedure that she would need to follow with local hospital if her SI should return and she needs immediate care.  She stated she understood and seemed more grounded at session end.  Goal review and progress/efforts noted with patient.   Next appt within approx 2 weeks.   Shanon Ace, LCSW

## 2019-09-02 ENCOUNTER — Other Ambulatory Visit: Payer: Self-pay

## 2019-09-02 ENCOUNTER — Encounter: Payer: Self-pay | Admitting: Podiatry

## 2019-09-02 ENCOUNTER — Ambulatory Visit (INDEPENDENT_AMBULATORY_CARE_PROVIDER_SITE_OTHER): Payer: BC Managed Care – PPO | Admitting: Podiatry

## 2019-09-02 ENCOUNTER — Ambulatory Visit (INDEPENDENT_AMBULATORY_CARE_PROVIDER_SITE_OTHER): Payer: BC Managed Care – PPO

## 2019-09-02 DIAGNOSIS — M85672 Other cyst of bone, left ankle and foot: Secondary | ICD-10-CM | POA: Diagnosis not present

## 2019-09-02 DIAGNOSIS — S82892A Other fracture of left lower leg, initial encounter for closed fracture: Secondary | ICD-10-CM

## 2019-09-02 DIAGNOSIS — M069 Rheumatoid arthritis, unspecified: Secondary | ICD-10-CM | POA: Diagnosis not present

## 2019-09-03 NOTE — Progress Notes (Signed)
Subjective:   Patient ID: Megan Whitney, female   DOB: 63 y.o.   MRN: [id number]   HPI Patient presents stating the left foot bothers me and I just wanted to get it checked it is better than it was but I still get some soreness    ROS      Objective:  Physical Exam  Neurovascular status intact with patient's left ankle showing mild swelling on the lateral side localized in nature with no significant fluid buildup and significant reduction of discomfort from previous     Assessment:  Overall doing well with inflammation noted left but still concerned about the swelling she is experiencing and the pain    Plan:  H&P reviewed condition and recommended the continuation of anti-inflammatory supportive shoe gear usage and physical therapy.  We will get a go ahead and get a CT scan of this is I am concerned about the possibility for bone cyst or the possibility for some kind of pathology of the lateral malleolus and I did discuss this case with Dr. Carman Ching and we both looked at x-rays and agreed on this x-rays indicate that there is some lucency on the fibula and the fibular malleolus left that I am concerned about and may be pathology that needs to be understood better with CT scan ordered at this time

## 2019-09-04 ENCOUNTER — Other Ambulatory Visit: Payer: Self-pay

## 2019-09-04 ENCOUNTER — Telehealth: Payer: Self-pay | Admitting: *Deleted

## 2019-09-04 ENCOUNTER — Ambulatory Visit (INDEPENDENT_AMBULATORY_CARE_PROVIDER_SITE_OTHER): Payer: BC Managed Care – PPO | Admitting: Psychiatry

## 2019-09-04 DIAGNOSIS — F319 Bipolar disorder, unspecified: Secondary | ICD-10-CM

## 2019-09-04 NOTE — Telephone Encounter (Signed)
"  It looks like Dr. Paulla Dolly has ordered a CT of the left ankle for her.  She's scheduled for next Wednesday with [place].  Her [employer] needs authorization.  Call with any questions."
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
## 2019-09-05 NOTE — Telephone Encounter (Signed)
I called [employer] and spoke to [place].  She gave me a new insurance number for Megan Whitney.  It is [id number].  I was able to get the CT authorized.  The authorization number is [id number].  It is valid from 09/05/2019 to 03/02/2020.  I called and informed Teara at [place].

## 2019-09-11 ENCOUNTER — Other Ambulatory Visit: Payer: Self-pay

## 2019-09-11 ENCOUNTER — Telehealth: Payer: Self-pay

## 2019-09-11 ENCOUNTER — Ambulatory Visit
Admission: RE | Admit: 2019-09-11 | Discharge: 2019-09-11 | Disposition: A | Discharge status: Home / Self Care | Payer: BC Managed Care – PPO | Source: Ambulatory Visit | Attending: Podiatry | Admitting: Podiatry

## 2019-09-11 DIAGNOSIS — M85672 Other cyst of bone, left ankle and foot: Secondary | ICD-10-CM

## 2019-09-11 DIAGNOSIS — M19072 Primary osteoarthritis, left ankle and foot: Secondary | ICD-10-CM | POA: Diagnosis not present

## 2019-09-11 DIAGNOSIS — N281 Cyst of kidney, acquired: Secondary | ICD-10-CM

## 2019-09-11 DIAGNOSIS — M7989 Other specified soft tissue disorders: Secondary | ICD-10-CM | POA: Diagnosis not present

## 2019-09-11 NOTE — Telephone Encounter (Signed)
-----   Message from Algernon Huxley, RN sent at 08/29/2019  8:28 AM EST ----- Regarding: FW: US kidney  ----- Message ----- From: Algernon Huxley, RN Sent: 08/12/2019 To: Algernon Huxley, RN Subject: US kidney                                      Pt need repeat US due to cyst on kidney

## 2019-09-11 NOTE — Telephone Encounter (Signed)
Pt scheduled for [place] of right kidney for follow-up of cyst on right kidney. Appt at [facility] 09/19/19@10 :30am, pt to arrive there at 10:15am with a full bladder. Pt aware.

## 2019-09-19 ENCOUNTER — Ambulatory Visit (HOSPITAL_COMMUNITY)
Admission: RE | Admit: 2019-09-19 | Discharge: 2019-09-19 | Disposition: A | Discharge status: Home / Self Care | Payer: BC Managed Care – PPO | Source: Ambulatory Visit | Attending: Internal Medicine | Admitting: Internal Medicine

## 2019-09-19 ENCOUNTER — Other Ambulatory Visit: Payer: Self-pay

## 2019-09-19 DIAGNOSIS — N281 Cyst of kidney, acquired: Secondary | ICD-10-CM | POA: Insufficient documentation

## 2019-09-20 ENCOUNTER — Encounter: Payer: Self-pay | Admitting: Podiatry

## 2019-09-20 ENCOUNTER — Ambulatory Visit: Payer: BC Managed Care – PPO | Admitting: Podiatry

## 2019-09-20 ENCOUNTER — Other Ambulatory Visit: Payer: Self-pay

## 2019-09-20 ENCOUNTER — Ambulatory Visit (INDEPENDENT_AMBULATORY_CARE_PROVIDER_SITE_OTHER): Payer: BC Managed Care – PPO | Admitting: Psychiatry

## 2019-09-20 DIAGNOSIS — M069 Rheumatoid arthritis, unspecified: Secondary | ICD-10-CM

## 2019-09-20 DIAGNOSIS — F319 Bipolar disorder, unspecified: Secondary | ICD-10-CM

## 2019-09-20 DIAGNOSIS — M899 Disorder of bone, unspecified: Secondary | ICD-10-CM | POA: Diagnosis not present

## 2019-09-20 DIAGNOSIS — M949 Disorder of cartilage, unspecified: Secondary | ICD-10-CM

## 2019-09-20 DIAGNOSIS — M1990 Unspecified osteoarthritis, unspecified site: Secondary | ICD-10-CM | POA: Diagnosis not present

## 2019-09-20 NOTE — Progress Notes (Signed)
Subjective:  Patient ID: Megan Whitney, female    DOB: April 10, 1956,  MRN: [id number]  Chief Complaint  Patient presents with  . Follow-up    64 y.o. female presents with the above complaint.  At the pleasure of seeing Megan Whitney with Dr. Paulla Whitney.  Patient presents with chronic ankle pain that has been going on over multiple areas.  Patient states is on and off pain.  However when it does come on it has been causing her a lot of pain.  She was immobilized by Dr. Paulla Whitney to the left side with a cam boot.  Patient states that she still has pain once transitioning out of the cam boot.  She today she does not present with a cam boot.  She states that she does not want a surgical intervention if not needed.  Patient states that she has not gotten injection to the ankle joint.  She denies any other acute complaints.  She is ambulating in regular sneakers.  Patient had a CT scan done which showed multiple cystic lesion and she is here to review the CT scan.   Review of Systems: Negative except as noted in the HPI. Denies N/V/F/Ch.  Past Medical History:  Diagnosis Date  . Allergy    Seasonal  . Anemia    History of GI blood loss  . Anxiety   . Arthritis   . Bipolar 1 disorder ([place])   . Colon polyps   . Depression   . Epistaxis    Around 2011 or 2012, required cauterization.   . Esophageal stricture   . GERD (gastroesophageal reflux disease)   . Headache(784.0)   . Hyperlipidemia   . Interstitial cystitis   . Lung cancer ([place]) 2002  . Obesity   . Sleep apnea    Doesn't use a CPAP    Current Outpatient Medications:  .  atorvastatin (LIPITOR) 10 MG tablet, Take 10 mg by mouth daily., Disp: , Rfl:  .  carbamazepine (EQUETRO) 200 MG CP12 12 hr capsule, Take 1 capsule (200 mg total) by mouth at bedtime., Disp: 30 capsule, Rfl: 1 .  carbamazepine (TEGRETOL) 100 MG chewable tablet, Chew 1 tablet (100 mg total) by mouth at bedtime., Disp: 30 tablet, Rfl: 1 .  cetirizine (ZYRTEC) 10 MG  tablet, Take 10 mg by mouth daily., Disp: , Rfl:  .  Cholecalciferol (VITAMIN D3 PO), Take 1,000 tablets by mouth daily., Disp: , Rfl:  .  dexmethylphenidate (FOCALIN) 10 MG tablet, Take 1 tablet (10 mg total) by mouth daily., Disp: 30 tablet, Rfl: 0 .  dexmethylphenidate (FOCALIN) 10 MG tablet, Take 1 tablet (10 mg total) by mouth daily., Disp: 30 tablet, Rfl: 0 .  Dexmethylphenidate HCl 25 MG CP24, Take 25 mg by mouth daily., Disp: 30 capsule, Rfl: 0 .  ferrous gluconate (FERGON) 324 MG tablet, Take 324 mg by mouth daily with breakfast., Disp: , Rfl:  .  FLUoxetine (PROZAC) 20 MG capsule, Take 1 capsule (20 mg total) by mouth daily., Disp: 30 capsule, Rfl: 2 .  lamoTRIgine (LAMICTAL) 200 MG tablet, TAKE 1 TABLET(200 MG) BY MOUTH TWICE DAILY, Disp: 60 tablet, Rfl: 2 .  lithium carbonate 150 MG capsule, TAKE 1 CAPSULE(150 MG) BY MOUTH DAILY, Disp: 90 capsule, Rfl: 1 .  Multiple Vitamin (MULTIVITAMIN) tablet, Take 1 tablet by mouth daily., Disp: , Rfl:  .  omeprazole (PRILOSEC) 20 MG capsule, Take 20 mg by mouth at bedtime., Disp: , Rfl:  .  pantoprazole (PROTONIX) 40 MG tablet, Take  40 mg by mouth daily., Disp: , Rfl:  .  VRAYLAR capsule, TAKE 1 CAPSULE(3 MG) BY MOUTH DAILY (Patient taking differently: Take 3 mg by mouth daily. ), Disp: 30 capsule, Rfl: 2  Social History   Tobacco Use  Smoking Status Never Smoker  Smokeless Tobacco Never Used    Allergies  Allergen Reactions  . Azithromycin Anaphylaxis  . Penicillins Anaphylaxis    DID THE REACTION INVOLVE: Swelling of the face/tongue/throat, SOB, or low BP? Yes Sudden or severe rash/hives, skin peeling, or the inside of the mouth or nose? Yes Did it require medical treatment? No When did it last happen? If all above answers are "NO", may proceed with cephalosporin use.  . Adhesive [Tape] Other (See Comments)    On bandaids   Objective:  There were no vitals filed for this visit. There is no height or weight on file to  calculate BMI. Constitutional Well developed. Well nourished.  Vascular Dorsalis pedis pulses palpable bilaterally. Posterior tibial pulses palpable bilaterally. Capillary refill normal to all digits.  No cyanosis or clubbing noted. Pedal hair growth normal.  Neurologic Normal speech. Oriented to person, place, and time. Epicritic sensation to light touch grossly present bilaterally.  Dermatologic Nails well groomed and normal in appearance. No open wounds. No skin lesions.  Orthopedic:  Pain on palpation to the medial gutter as well as lateral gutter.  Mild pain with range of motion of the ankle joint dorsiflexion and plantarflexion.  No crepitus felt within the ankle joint.  No pain at the peroneal tendon posterior tibial tendon ATFL or Achilles tendon.   Radiographs: IMPRESSION: 1. No acute osseous abnormality of the left ankle. 2. 3 x 4 mm osteochondral lesion at the medial talar shoulder posteriorly. Additional 4 x 2 mm subchondral lucency at the posterior aspect of the lateral ankle mortise. 3. Probable small tibiotalar and subtalar joint effusions. 4. Moderate degenerative changes within the midfoot as described above. 5. Subchondral lucencies within the dorsal aspect of the lateral cuneiform could possibly represent small erosions. Underlying inflammatory arthropathy not excluded.  Assessment:   1. Osteochondral talar dome lesion   2. Rheumatoid arthritis of left foot, unspecified whether rheumatoid factor present ([place])   3. Arthritis    Plan:  Patient was evaluated and treated and all questions answered.  Left osteochondral lesion of the talar dome medial and lateral -I explained to the patient the etiology of osteochondral lesion as well as various treatment options associated with it.  CT scan was reviewed with the patient and given the size of the osteochondral lesion I believe patient will benefit from a surgical intervention however would like to exhaust all  conservative options before proceeding with surgical intervention.  I believe patient will benefit from steroid injection to see if this will help alleviate the pain.  If this does help with the pain we can do the steroid injection couple of times of year.  However if this does not help alleviate the pain, I will discuss surgical intervention during next visit. -Patient will be weightbearing as tolerated in regular sneakers as she cannot tolerate the cam boot. -A steroid injection was performed at bilateral ankle joint using 1% plain Lidocaine and 10 mg of Kenalog. This was well tolerated.   Return in about 4 weeks (around 10/18/2019).
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
## 2019-09-23 ENCOUNTER — Encounter: Payer: Self-pay | Admitting: Psychiatry

## 2019-09-23 ENCOUNTER — Ambulatory Visit (INDEPENDENT_AMBULATORY_CARE_PROVIDER_SITE_OTHER): Payer: BC Managed Care – PPO | Admitting: Psychiatry

## 2019-09-23 ENCOUNTER — Other Ambulatory Visit: Payer: Self-pay

## 2019-09-23 ENCOUNTER — Other Ambulatory Visit: Payer: Self-pay | Admitting: Physician Assistant

## 2019-09-23 VITALS — BP 125/88 | HR 106

## 2019-09-23 DIAGNOSIS — G251 Drug-induced tremor: Secondary | ICD-10-CM | POA: Diagnosis not present

## 2019-09-23 DIAGNOSIS — R7989 Other specified abnormal findings of blood chemistry: Secondary | ICD-10-CM

## 2019-09-23 DIAGNOSIS — F900 Attention-deficit hyperactivity disorder, predominantly inattentive type: Secondary | ICD-10-CM | POA: Diagnosis not present

## 2019-09-23 DIAGNOSIS — F411 Generalized anxiety disorder: Secondary | ICD-10-CM | POA: Diagnosis not present

## 2019-09-23 DIAGNOSIS — F319 Bipolar disorder, unspecified: Secondary | ICD-10-CM | POA: Diagnosis not present

## 2019-09-23 DIAGNOSIS — R296 Repeated falls: Secondary | ICD-10-CM

## 2019-09-23 MED ORDER — DEXMETHYLPHENIDATE HCL ER 25 MG PO CP24: 1.0000 | ORAL_CAPSULE | ORAL | 0 refills | Status: DC

## 2019-09-23 MED ORDER — CARBAMAZEPINE 100 MG PO CHEW: 200.0000 mg | CHEWABLE_TABLET | Freq: Every day | ORAL | 0 refills | Status: DC

## 2019-09-23 MED ORDER — DEXMETHYLPHENIDATE HCL 10 MG PO TABS: 10.0000 mg | ORAL_TABLET | Freq: Every day | ORAL | 0 refills | Status: DC

## 2019-09-23 MED ORDER — PANTOPRAZOLE SODIUM 40 MG PO TBEC: 40.0000 mg | DELAYED_RELEASE_TABLET | Freq: Every day | ORAL | 0 refills | Status: DC

## 2019-09-23 MED ORDER — DEXMETHYLPHENIDATE HCL ER 25 MG PO CP24: 25.0000 mg | ORAL_CAPSULE | Freq: Every day | ORAL | 0 refills | Status: DC

## 2019-09-23 NOTE — Patient Instructions (Signed)
Increase immediate release carbamazepine to 2 of the 100 mg tablets about 1 hour before bedtime Continue Equetro 200 mg nightly and no change in other medications.
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
## 2019-09-30 ENCOUNTER — Other Ambulatory Visit: Payer: Self-pay

## 2019-09-30 ENCOUNTER — Ambulatory Visit (INDEPENDENT_AMBULATORY_CARE_PROVIDER_SITE_OTHER): Payer: BC Managed Care – PPO | Admitting: Psychiatry

## 2019-09-30 DIAGNOSIS — F319 Bipolar disorder, unspecified: Secondary | ICD-10-CM | POA: Diagnosis not present
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
## 2019-10-02 ENCOUNTER — Ambulatory Visit: Payer: BC Managed Care – PPO | Admitting: Psychiatry

## 2019-10-03 ENCOUNTER — Other Ambulatory Visit: Payer: Self-pay | Admitting: Psychiatry

## 2019-10-11 ENCOUNTER — Other Ambulatory Visit: Payer: Self-pay

## 2019-10-11 ENCOUNTER — Ambulatory Visit (INDEPENDENT_AMBULATORY_CARE_PROVIDER_SITE_OTHER): Payer: BC Managed Care – PPO | Admitting: Psychiatry

## 2019-10-11 DIAGNOSIS — F319 Bipolar disorder, unspecified: Secondary | ICD-10-CM | POA: Diagnosis not present
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
## 2019-10-16 ENCOUNTER — Other Ambulatory Visit: Payer: Self-pay

## 2019-10-16 ENCOUNTER — Ambulatory Visit (INDEPENDENT_AMBULATORY_CARE_PROVIDER_SITE_OTHER): Payer: BC Managed Care – PPO | Admitting: Psychiatry

## 2019-10-16 DIAGNOSIS — F319 Bipolar disorder, unspecified: Secondary | ICD-10-CM

## 2019-10-16 NOTE — Progress Notes (Signed)
      [facility] Counselor/Therapist Progress Note  Patient ID: Megan Whitney, MRN: [id number],    Date: 10/16/2019  Time Spent: 60 minutes  9:00am to 10:00am  Treatment Type: Individual Therapy  Reported Symptoms: anxiety, irritability, unmotivated, some depression  Mental Status Exam:  Appearance:   Neat     Behavior:  Appropriate and Sharing  Motor:  Normal  Speech/Language:   Normal Rate  Affect:  anxious, some depression  Mood:  anxious and depressed  Thought process:  goal directed  Thought content:    WNL  Sensory/Perceptual disturbances:    WNL  Orientation:  oriented to person, place, time/date, situation, day of week, month of year and year  Attention:  Good  Concentration:  Good  Memory:  some forgetfulness  Fund of knowledge:   Good  Insight:    Good  Judgment:   Good  Impulse Control:  Good   Risk Assessment: Danger to Self:  No Self-injurious Behavior: No Danger to Others: No Duty to Warn:no Physical Aggression / Violence:No  Access to Firearms a concern: No  Gang Involvement:No   Subjective: Patient in today reporting some improvement since last session.  Still some difficulty focusing. Making some progress.  Hard to organize at work. Does feel some encouragement that overall she is some better and free of SI.   Interventions: Cognitive Behavioral Therapy and Solution-Oriented/Positive Psychology  Diagnosis:   ICD-10-CM   1. Bipolar I disorder with rapid cycling ([place])  F31.9      Plan: Patient not signing tx plan on computer screen due to [place].  Treatment Goals: Goals remain on plan as patient works on strategies to meet her goals. Progress is noted each visit in "Progress" section of goal plan.  Long Term Goal: Reduce overall level, frequency, and intensity of the anxiety so that daily functioning is not impaired.  Short Term Goal: 1.Increase understanding of the beliefs and messages that produce the worry and  anxiety.  Strategies: 1.Help client develop reality-based positive cognitive messages. 2. Develop a "coping card" or other reminder which coping strategies are recorded for patient's later use .  PROGRESS: Patient reports some progress in her depression, and her getting to work on time "and even earlier". Is having some problems organizing and completing some tasks.  Concerned that her annual evaluation at work is coming due later this week and she feels she has not done as well.  "I feel my bipolar is getting in the way more."  States that she has used some breathing exercises that have helped some at work. Needing to stay in the present, and not make assumptions that tend to be anxiety-provoking and negative, stop "assuming the worstl", practice looking for what may go right versus wrong.  Self-talk needs to be more positive. Focused on each of these with patient today as part of her goal-work and extending into homework between sessions.  She is more level-out today during her appt. States she has re-joined [employer] virtual group helping her focus on healthy nutrition and emotionally empowering skills, which includes an individual and group coach. To remain in contact with supportive friends and her knitting group, stay on prescribe meds, continue deep breathing exercises, getting outside each day. Trying to be more aware of triggers. Goal review and progress noted with patient.   Next appt within 2 weeks.   Shanon Ace, LCSW

## 2019-10-18 ENCOUNTER — Other Ambulatory Visit: Payer: Self-pay

## 2019-10-18 ENCOUNTER — Ambulatory Visit: Payer: BC Managed Care – PPO | Admitting: Podiatry

## 2019-10-18 ENCOUNTER — Encounter: Payer: Self-pay | Admitting: Podiatry

## 2019-10-18 DIAGNOSIS — M949 Disorder of cartilage, unspecified: Secondary | ICD-10-CM

## 2019-10-18 DIAGNOSIS — M899 Disorder of bone, unspecified: Secondary | ICD-10-CM | POA: Diagnosis not present

## 2019-10-18 DIAGNOSIS — M1990 Unspecified osteoarthritis, unspecified site: Secondary | ICD-10-CM

## 2019-10-18 NOTE — Progress Notes (Signed)
Subjective:  Patient ID: Megan Whitney, female    DOB: 30-Oct-1955,  MRN: [id number]  Chief Complaint  Patient presents with  . Foot Pain    pt is here for a f/u on left foot pain, pt states that she is feeling a lot better since the last time she was here, pt states that the injections she had last time has helped tremendously, and states that her pain is a 2 out of 10 on the pain scale.    64 y.o. female presents with the above complaint.  Patient presents for follow-up from the left ankle chronic pain due to OCD lesions that were found on an MRI.  Patient states that her pain has completely resolved.  The injection that I gave her last time had completely resolved her pain.  She has been able to ambulate normally.  She states that sometimes her pain may start acting up but overall has completely resolved.  She denies any other acute complaints.   Review of Systems: Negative except as noted in the HPI. Denies N/V/F/Ch.  Past Medical History:  Diagnosis Date  . Allergy    Seasonal  . Anemia    History of GI blood loss  . Anxiety   . Arthritis   . Bipolar 1 disorder ([place])   . Colon polyps   . Depression   . Epistaxis    Around 2011 or 2012, required cauterization.   . Esophageal stricture   . GERD (gastroesophageal reflux disease)   . Headache(784.0)   . Hyperlipidemia   . Interstitial cystitis   . Lung cancer ([place]) 2002  . Obesity   . Sleep apnea    Doesn't use a CPAP    Current Outpatient Medications:  .  atorvastatin (LIPITOR) 10 MG tablet, Take 10 mg by mouth daily., Disp: , Rfl:  .  carbamazepine (EQUETRO) 200 MG CP12 12 hr capsule, Take 1 capsule (200 mg total) by mouth at bedtime., Disp: 30 capsule, Rfl: 1 .  carbamazepine (TEGRETOL) 100 MG chewable tablet, Chew 2 tablets (200 mg total) by mouth at bedtime., Disp: 60 tablet, Rfl: 0 .  cetirizine (ZYRTEC) 10 MG tablet, Take 10 mg by mouth daily., Disp: , Rfl:  .  Cholecalciferol (VITAMIN D3 PO), Take 1,000 tablets  by mouth daily., Disp: , Rfl:  .  dexmethylphenidate (FOCALIN) 10 MG tablet, Take 1 tablet (10 mg total) by mouth daily., Disp: 30 tablet, Rfl: 0 .  [START ON 10/21/2019] dexmethylphenidate (FOCALIN) 10 MG tablet, Take 1 tablet (10 mg total) by mouth daily., Disp: 30 tablet, Rfl: 0 .  [START ON 10/21/2019] Dexmethylphenidate HCl (FOCALIN XR) 25 MG CP24, Take 1 capsule by mouth every morning., Disp: 30 capsule, Rfl: 0 .  Dexmethylphenidate HCl 25 MG CP24, Take 25 mg by mouth daily., Disp: 30 capsule, Rfl: 0 .  ferrous gluconate (FERGON) 324 MG tablet, Take 324 mg by mouth daily with breakfast., Disp: , Rfl:  .  FLUoxetine (PROZAC) 20 MG capsule, Take 1 capsule (20 mg total) by mouth daily., Disp: 30 capsule, Rfl: 2 .  lamoTRIgine (LAMICTAL) 200 MG tablet, TAKE 1 TABLET(200 MG) BY MOUTH TWICE DAILY, Disp: 60 tablet, Rfl: 2 .  lithium carbonate 150 MG capsule, TAKE 1 CAPSULE(150 MG) BY MOUTH DAILY, Disp: 90 capsule, Rfl: 1 .  Multiple Vitamin (MULTIVITAMIN) tablet, Take 1 tablet by mouth daily., Disp: , Rfl:  .  omeprazole (PRILOSEC) 20 MG capsule, Take 20 mg by mouth at bedtime., Disp: , Rfl:  .  pantoprazole (PROTONIX) 40 MG tablet, Take 1 tablet (40 mg total) by mouth daily., Disp: 90 tablet, Rfl: 0 .  VRAYLAR capsule, TAKE 1 CAPSULE(3 MG) BY MOUTH DAILY (Patient taking differently: Take 3 mg by mouth daily. ), Disp: 30 capsule, Rfl: 2  Social History   Tobacco Use  Smoking Status Never Smoker  Smokeless Tobacco Never Used    Allergies  Allergen Reactions  . Azithromycin Anaphylaxis  . Penicillins Anaphylaxis    DID THE REACTION INVOLVE: Swelling of the face/tongue/throat, SOB, or low BP? Yes Sudden or severe rash/hives, skin peeling, or the inside of the mouth or nose? Yes Did it require medical treatment? No When did it last happen? If all above answers are "NO", may proceed with cephalosporin use.  . Adhesive [Tape] Other (See Comments)    On bandaids   Objective:  There were  no vitals filed for this visit. There is no height or weight on file to calculate BMI. Constitutional Well developed. Well nourished.  Vascular Dorsalis pedis pulses palpable bilaterally. Posterior tibial pulses palpable bilaterally. Capillary refill normal to all digits.  No cyanosis or clubbing noted. Pedal hair growth normal.  Neurologic Normal speech. Oriented to person, place, and time. Epicritic sensation to light touch grossly present bilaterally.  Dermatologic Nails well groomed and normal in appearance. No open wounds. No skin lesions.  Orthopedic:  No pain on palpation to the medial gutter as well as lateral gutter.  No pain with range of motion of the ankle joint dorsiflexion and plantarflexion.  No crepitus felt within the ankle joint.  No pain at the peroneal tendon posterior tibial tendon ATFL or Achilles tendon.   Radiographs: IMPRESSION: 1. No acute osseous abnormality of the left ankle. 2. 3 x 4 mm osteochondral lesion at the medial talar shoulder posteriorly. Additional 4 x 2 mm subchondral lucency at the posterior aspect of the lateral ankle mortise. 3. Probable small tibiotalar and subtalar joint effusions. 4. Moderate degenerative changes within the midfoot as described above. 5. Subchondral lucencies within the dorsal aspect of the lateral cuneiform could possibly represent small erosions. Underlying inflammatory arthropathy not excluded.  Assessment:   No diagnosis found. Plan:  Patient was evaluated and treated and all questions answered.  Left osteochondral lesion of the talar dome medial and lateral -Completely resolved with a steroid injection.  I explained to the patient that the pain may come back in the future if it does he will come back and see me for another steroid injection.  However if there is increase in frequency of the steroid injection patient will benefit from arthroscopy of the left ankle with a possible microfracturing.  However at this  time given that the injection had completely resolved her pain I will hold off on any further surgical consideration. -I will see her back again in 3 months for evaluation and management of the left chronic ankle pain.   No follow-ups on file.

## 2019-10-23 ENCOUNTER — Telehealth: Payer: Self-pay | Admitting: Psychiatry

## 2019-10-23 ENCOUNTER — Other Ambulatory Visit: Payer: Self-pay

## 2019-10-23 ENCOUNTER — Ambulatory Visit (INDEPENDENT_AMBULATORY_CARE_PROVIDER_SITE_OTHER): Payer: BC Managed Care – PPO | Admitting: Physician Assistant

## 2019-10-23 ENCOUNTER — Encounter: Payer: Self-pay | Admitting: Physician Assistant

## 2019-10-23 ENCOUNTER — Ambulatory Visit (HOSPITAL_BASED_OUTPATIENT_CLINIC_OR_DEPARTMENT_OTHER)
Admission: RE | Admit: 2019-10-23 | Discharge: 2019-10-23 | Disposition: A | Discharge status: Home / Self Care | Payer: BC Managed Care – PPO | Source: Ambulatory Visit | Attending: Physician Assistant | Admitting: Physician Assistant

## 2019-10-23 VITALS — BP 138/80 | HR 85 | Temp 97.3°F | Resp 18 | Ht 64.0 in | Wt 171.0 lb

## 2019-10-23 DIAGNOSIS — G44319 Acute post-traumatic headache, not intractable: Secondary | ICD-10-CM

## 2019-10-23 DIAGNOSIS — S0990XA Unspecified injury of head, initial encounter: Secondary | ICD-10-CM | POA: Insufficient documentation

## 2019-10-23 DIAGNOSIS — S80211A Abrasion, right knee, initial encounter: Secondary | ICD-10-CM | POA: Diagnosis not present

## 2019-10-23 DIAGNOSIS — R110 Nausea: Secondary | ICD-10-CM

## 2019-10-23 DIAGNOSIS — R519 Headache, unspecified: Secondary | ICD-10-CM | POA: Diagnosis not present

## 2019-10-23 DIAGNOSIS — S80212A Abrasion, left knee, initial encounter: Secondary | ICD-10-CM

## 2019-10-23 MED ORDER — ONDANSETRON HCL 4 MG/5ML PO SOLN
4.0000 mg | Freq: Once | ORAL | Status: AC
  Administered 2019-10-23: 4 mg via ORAL

## 2019-10-23 MED ORDER — MECLIZINE HCL 25 MG PO TABS: 25.0000 mg | ORAL_TABLET | Freq: Three times a day (TID) | ORAL | 0 refills | Status: DC | PRN

## 2019-10-23 MED ORDER — ONDANSETRON 4 MG PO TBDP: 4.0000 mg | ORAL_TABLET | Freq: Three times a day (TID) | ORAL | 0 refills | Status: DC | PRN

## 2019-10-23 NOTE — Addendum Note (Signed)
Addended by: Brunetta Jeans on: 10/23/2019 01:31 PM   Modules accepted: Orders

## 2019-10-23 NOTE — Telephone Encounter (Signed)
Pt left VM stating she had another fall. Stated she suffered a concussion and broke her glasses. She stated it was due to her medication. Please advise.

## 2019-10-23 NOTE — Telephone Encounter (Signed)
Left patient detailed voicemail with information

## 2019-10-23 NOTE — Patient Instructions (Signed)
Thankfully your neurological exam is good in office today. You have good range of motion of the neck. The dizziness is coming from concussion most likely and the nausea is from the dizziness. We are getting a head CT STAT today to make sure there is nothing concerning contributing to your symptoms.  No driving until symptoms resolve.  Keep hydrated and keep a bland diet to help with nausea. The shot given in office today should help with this.  I have sent in medication to help with nausea and dizziness to the pharmacy.  Take Extra-Strength tylenol for headache. Avoid bright lights. I want you to stay awake until we verify your imaging looks good. We will make further adjustments once we have these results. If anything acutely worsens between now and then, you need  ER assessment.   For the knees -- keep elevated while resting. Keep skin clean and dry.  Can apply small amount of Neosporin to the area for the next 1-2 days.   Please go to [facility] for imaging. We will call you with your results and alter treatment accordingly.  [facility] [place], [place] [address]  When you go in the main entrance, the radiology department is just to the right of the elevator. They will take good care of you.

## 2019-10-23 NOTE — Telephone Encounter (Signed)
Reduce carbamazepine immediate release IR from 200 mg at night to 100 mg at night.  Continue Equetro without change.

## 2019-10-23 NOTE — Progress Notes (Signed)
Patient presents to clinic today after sustaining a fall this morning at work.  States she got to work and was stepping up on a curb to going the building.  Notes her mask was obscuring her vision partly.  Missed stepped and tripped on the curb, falling down and hitting her left knee, left side and the left side of her face/head.  Denies any loss of consciousness.  Was able to get herself up but noticed some headache and dizziness.  Since then is having headache bilaterally, left greater than right, nausea, dizziness and mental fogginess.  Denies confusion.  Denies vision changes, hearing changes.  Notes significant pain in her left knee.  Notes both knees are scraped.  She did break her glasses and sustained a small scratch to her nose.  Also notes busting her lower lip.  Patient denies any dizziness, racing heart, shortness of breath or feeling off balance prior to fall.  Denies any episodes of vomiting.  Notes dizziness is worse with positional change.  Denies lightheadedness.  Denies any neck pain or upper back pain.  Denies pain in chest or abdomen.  Denies any noted bruising thus far.  Is able to bear weight on both lower extremities and ambulate without severe increase in pain.  Past Medical History:  Diagnosis Date  . Allergy    Seasonal  . Anemia    History of GI blood loss  . Anxiety   . Arthritis   . Bipolar 1 disorder ([place])   . Colon polyps   . Depression   . Epistaxis    Around 2011 or 2012, required cauterization.   . Esophageal stricture   . GERD (gastroesophageal reflux disease)   . Headache(784.0)   . Hyperlipidemia   . Interstitial cystitis   . Lung cancer ([place]) 2002  . Obesity   . Sleep apnea    Doesn't use a CPAP    Current Outpatient Medications on File Prior to Visit  Medication Sig Dispense Refill  . atorvastatin (LIPITOR) 10 MG tablet Take 10 mg by mouth daily.    . carbamazepine (EQUETRO) 200 MG CP12 12 hr capsule Take 1 capsule (200 mg total) by mouth at  bedtime. 30 capsule 1  . carbamazepine (TEGRETOL) 100 MG chewable tablet Chew 2 tablets (200 mg total) by mouth at bedtime. 60 tablet 0  . cetirizine (ZYRTEC) 10 MG tablet Take 10 mg by mouth daily.    . Cholecalciferol (VITAMIN D3 PO) Take 1,000 tablets by mouth daily.    Marland Kitchen dexmethylphenidate (FOCALIN) 10 MG tablet Take 1 tablet (10 mg total) by mouth daily. 30 tablet 0  . dexmethylphenidate (FOCALIN) 10 MG tablet Take 1 tablet (10 mg total) by mouth daily. 30 tablet 0  . Dexmethylphenidate HCl (FOCALIN XR) 25 MG CP24 Take 1 capsule by mouth every morning. 30 capsule 0  . Dexmethylphenidate HCl 25 MG CP24 Take 25 mg by mouth daily. 30 capsule 0  . ferrous gluconate (FERGON) 324 MG tablet Take 324 mg by mouth daily with breakfast.    . FLUoxetine (PROZAC) 20 MG capsule Take 1 capsule (20 mg total) by mouth daily. 30 capsule 2  . lamoTRIgine (LAMICTAL) 200 MG tablet TAKE 1 TABLET(200 MG) BY MOUTH TWICE DAILY 60 tablet 2  . lithium carbonate 150 MG capsule TAKE 1 CAPSULE(150 MG) BY MOUTH DAILY 90 capsule 1  . Multiple Vitamin (MULTIVITAMIN) tablet Take 1 tablet by mouth daily.    Marland Kitchen omeprazole (PRILOSEC) 20 MG capsule Take 20 mg by  mouth at bedtime.    . pantoprazole (PROTONIX) 40 MG tablet Take 1 tablet (40 mg total) by mouth daily. 90 tablet 0  . VRAYLAR capsule TAKE 1 CAPSULE(3 MG) BY MOUTH DAILY (Patient taking differently: Take 3 mg by mouth daily. ) 30 capsule 2   No current facility-administered medications on file prior to visit.    Allergies  Allergen Reactions  . Azithromycin Anaphylaxis  . Penicillins Anaphylaxis    DID THE REACTION INVOLVE: Swelling of the face/tongue/throat, SOB, or low BP? Yes Sudden or severe rash/hives, skin peeling, or the inside of the mouth or nose? Yes Did it require medical treatment? No When did it last happen? If all above answers are "NO", may proceed with cephalosporin use.  . Adhesive [Tape] Other (See Comments)    On bandaids    Family  History  Problem Relation Age of Onset  . Arthritis Mother   . Hearing loss Mother   . Hyperlipidemia Mother   . Hypertension Mother   . Hypertension Father   . Diabetes Mellitus II Father   . Heart disease Father   . Arthritis Father   . Cancer Father        Brain  . COPD Father   . Diabetes Father   . Hyperlipidemia Father   . Early death Sister        Aneroxia/Bulimic  . Depression Brother   . Early death Investment banker, corporate  . Depression Daughter   . Drug abuse Daughter   . Heart disease Daughter   . Hypertension Daughter   . Stroke Maternal Grandmother   . Hypertension Maternal Grandmother   . Arthritis Maternal Grandfather   . Heart attack Maternal Grandfather   . Hearing loss Maternal Grandfather   . Colon cancer Neg Hx   . Esophageal cancer Neg Hx   . Rectal cancer Neg Hx     Social History   Socioeconomic History  . Marital status: Married    Spouse name: Not on file  . Number of children: 1  . Years of education: Not on file  . Highest education level: Not on file  Occupational History  . Occupation: Admin. assistant  Tobacco Use  . Smoking status: Never Smoker  . Smokeless tobacco: Never Used  Substance and Sexual Activity  . Alcohol use: Yes    Alcohol/week: 1.0 standard drinks    Types: 1 Glasses of wine per week    Comment: Moderate  . Drug use: No  . Sexual activity: Not Currently  Other Topics Concern  . Not on file  Social History Narrative  . Not on file   Social Determinants of Health   Financial Resource Strain:   . Difficulty of Paying Living Expenses: Not on file  Food Insecurity:   . Worried About Charity fundraiser in the Last Year: Not on file  . Ran Out of Food in the Last Year: Not on file  Transportation Needs:   . Lack of Transportation (Medical): Not on file  . Lack of Transportation (Non-Medical): Not on file  Physical Activity:   . Days of Exercise per Week: Not on file  . Minutes of Exercise per Session:  Not on file  Stress:   . Feeling of Stress : Not on file  Social Connections:   . Frequency of Communication with Friends and Family: Not on file  . Frequency of Social Gatherings with Friends and Family: Not on file  .  Attends Religious Services: Not on file  . Active Member of Clubs or Organizations: Not on file  . Attends Archivist Meetings: Not on file  . Marital Status: Not on file    Review of Systems - See HPI.  All other ROS are negative.  BP 138/80   Pulse 85   Temp (!) 97.3 F (36.3 C) (Temporal)   Resp 18   Ht 5\' 4"  (1.626 m)   Wt 171 lb (77.6 kg)   SpO2 97%   BMI 29.35 kg/m   Physical Exam Vitals reviewed.  Constitutional:      Appearance: Normal appearance.  HENT:     Head: Normocephalic and atraumatic.      Right Ear: Tympanic membrane normal.     Left Ear: Tympanic membrane normal.  Eyes:     General: No visual field deficit.    Extraocular Movements: Extraocular movements intact.     Conjunctiva/sclera: Conjunctivae normal.     Pupils: Pupils are equal, round, and reactive to light.  Cardiovascular:     Rate and Rhythm: Normal rate and regular rhythm.     Pulses: Normal pulses.     Heart sounds: Normal heart sounds.  Pulmonary:     Effort: Pulmonary effort is normal.     Breath sounds: Normal breath sounds.  Abdominal:     General: Bowel sounds are normal. There is no distension.     Palpations: Abdomen is soft.     Tenderness: There is no abdominal tenderness. There is no guarding or rebound.  Musculoskeletal:     Right shoulder: Normal.     Left shoulder: Normal.     Cervical back: Normal and neck supple.     Thoracic back: Normal.     Lumbar back: Tenderness (Left paraspinal muscular tenderness.) present. No spasms or bony tenderness. Normal range of motion.     Right knee: No ecchymosis, bony tenderness or crepitus. Normal range of motion. No tenderness. Normal alignment.     Left knee: No ecchymosis, bony tenderness or crepitus.  Normal range of motion. Tenderness present over the patellar tendon. No medial joint line, lateral joint line, MCL, LCL, ACL or PCL tenderness. Normal alignment.  Skin:      Neurological:     General: No focal deficit present.     Mental Status: She is alert and oriented to person, place, and time.     GCS: GCS eye subscore is 4. GCS verbal subscore is 5. GCS motor subscore is 6.     Cranial Nerves: Cranial nerves are intact. No facial asymmetry.     Sensory: Sensation is intact.     Motor: Motor function is intact.     Coordination: Coordination is intact.     Comments: Gait intact overall. She is a little unsteady due to dizziness.  Psychiatric:        Mood and Affect: Mood normal.     Recent Results (from the past 2160 hour(s))  Novel Coronavirus, NAA ([facility])     Status: None   Collection Time: 08/13/19 12:03 PM   Specimen: Nasopharyngeal(NP) swabs in vial transport medium   NASOPHARYNGE  TESTING  Result Value Ref Range   SARS-CoV-2, NAA Not Detected Not Detected    Comment: This nucleic acid amplification test was developed and its performance characteristics determined by [employer]. Nucleic acid amplification tests include PCR and TMA. This test has not been FDA cleared or approved. This test has been authorized by FDA under an Emergency  Use Authorization (EUA). This test is only authorized for the duration of time the declaration that circumstances exist justifying the authorization of the emergency use of in vitro diagnostic tests for detection of SARS-CoV-2 virus and/or diagnosis of COVID-19 infection under section 564(b)(1) of the Act, 21 U.S.C. 809XIP-3(A) (1), unless the authorization is terminated or revoked sooner. When diagnostic testing is negative, the possibility of a false negative result should be considered in the context of a patient's recent exposures and the presence of clinical signs and symptoms consistent with COVID-19. An individual  without symptoms of COVID-19 and who is not shedding SARS-CoV-2 virus would  expect to have a negative (not detected) result in this assay.     Assessment/Plan: 1. Injury of head, initial encounter 2. Acute post-traumatic headache, not intractable Patient with dizziness nausea and headache.  Neurological exam within normal limits at today's visit with GCS 15.  Will obtain CT head to rule out any concern for bleed.  CT obtained and negative for any acute intracranial abnormality.  Rx Zofran and meclizine.  Mental rest recommended.  Extra strength Tylenol for headache.  We will follow-up in a couple days to see how things are going.  Patient was taken out of work for the rest of the week so that she can recuperate.  Strict ER precautions reviewed with patient and husband. - CT Head Wo Contrast; Future  3. Nausea Secondary to numbers 1 and 2. IM Zofran given in office with significant improvement.  Will give prescription for oral Zofran to use as directed if needed. - ondansetron (ZOFRAN) 4 MG/5ML solution 4 mg  4. Abrasion of both knees Superficial abrasions of knees bilaterally.  No active bleeding.  Wounds cleaned and scrubbed with sterile saline.  Area of dried very well.  Small amount of biotic ointment applied.  Bandages applied to the knees bilaterally.  Home skin care instructions reviewed with patient and husband.  This visit occurred during the SARS-CoV-2 public health emergency.  Safety protocols were in place, including screening questions prior to the visit, additional usage of staff PPE, and extensive cleaning of exam room while observing appropriate contact time as indicated for disinfecting solutions.     Leeanne Rio, PA-C

## 2019-10-24 NOTE — Telephone Encounter (Signed)
Called patient to verify she received my message and she did. Was very appreciative of follow up call. Instructed to call back if not feeling better.

## 2019-10-25 ENCOUNTER — Telehealth: Payer: Self-pay | Admitting: Physician Assistant

## 2019-10-25 ENCOUNTER — Encounter: Payer: Self-pay | Admitting: Physician Assistant

## 2019-10-25 DIAGNOSIS — R2681 Unsteadiness on feet: Secondary | ICD-10-CM

## 2019-10-25 NOTE — Telephone Encounter (Signed)
Work note has been completed and sent to [employer].

## 2019-10-25 NOTE — Telephone Encounter (Signed)
Pt called in asking for a work note, she would like this sent via mychart.

## 2019-10-28 ENCOUNTER — Encounter: Payer: Self-pay | Admitting: Physician Assistant

## 2019-10-29 ENCOUNTER — Ambulatory Visit: Payer: BC Managed Care – PPO | Attending: Physician Assistant | Admitting: Physical Therapy

## 2019-10-29 ENCOUNTER — Other Ambulatory Visit: Payer: Self-pay

## 2019-10-29 ENCOUNTER — Ambulatory Visit: Payer: BC Managed Care – PPO | Admitting: Physical Therapy

## 2019-10-29 DIAGNOSIS — R296 Repeated falls: Secondary | ICD-10-CM | POA: Insufficient documentation

## 2019-10-29 DIAGNOSIS — M6281 Muscle weakness (generalized): Secondary | ICD-10-CM | POA: Diagnosis not present

## 2019-10-29 NOTE — Therapy (Signed)
[facility] [address] [place], [place], [address] Phone: [phone]   Fax:  [phone]  Physical Therapy Evaluation  Patient Details  Name: Megan Whitney MRN: [id number] Date of Birth: [birth year] Referring Provider (PT): Brunetta Jeans, [place]   Encounter Date: 10/29/2019  PT End of Session - 10/29/19 1212    Visit Number  1    Date for PT Re-Evaluation  01/21/20    PT Start Time  1102    PT Stop Time  1145    PT Time Calculation (min)  43 min    Activity Tolerance  Patient tolerated treatment well    Behavior During Therapy  [facility] for tasks assessed/performed       Past Medical History:  Diagnosis Date  . Allergy    Seasonal  . Anemia    History of GI blood loss  . Anxiety   . Arthritis   . Bipolar 1 disorder ([place])   . Colon polyps   . Depression   . Epistaxis    Around 2011 or 2012, required cauterization.   . Esophageal stricture   . GERD (gastroesophageal reflux disease)   . Headache(784.0)   . Hyperlipidemia   . Interstitial cystitis   . Lung cancer ([place]) 2002  . Obesity   . Sleep apnea    Doesn't use a CPAP    Past Surgical History:  Procedure Laterality Date  . BALLOON DILATION  05/16/2012   Procedure: BALLOON DILATION;  Surgeon: Inda Castle, MD;  Location: [place];  Service: Endoscopy;  Laterality: N/A;  . COLONOSCOPY    . ENTEROSCOPY  05/16/2012   Procedure: ENTEROSCOPY;  Surgeon: Inda Castle, MD;  Location: [place];  Service: Endoscopy;  Laterality: N/A;  . JOINT REPLACEMENT    . right shoulder durgery 25 yrs ago    . TOTAL HIP ARTHROPLASTY  2006, 2008   bilateral  . TUBAL LIGATION    . WEDGE RESECTION  2002   lung cancer    There were no vitals filed for this visit.   Subjective Assessment - 10/29/19 1104    Subjective  Pt here due to frequent falls and last time she fell was last Wednesday.  Pt states she had a concussion after tripping on a curb,  falling and hitting her head.  Pt is having HA and soreness in the left hip due to also landing on the hip.  Previously she had 2 other falls  in Jan 2020, then Oct 21 2018 fell and fractured pelvis.  Pt had to be on bed rest for 6 weeks after the pelvis fracture - no surgery.    Pertinent History  bilater THA 2006 and 2007; polypharmacy (Pt states this may be part of the reason for balance issues)    Limitations  Walking    Patient Stated Goals  be able to return to walking daily and walk without shuffling; be able to hike; not be afraid to go out and walk    Currently in Pain?  No/denies         [facility] PT Assessment - 10/29/19 0001      Assessment   Medical Diagnosis  R26.81 (ICD-10-CM) - Gait instability    Referring Provider (PT)  Brunetta Jeans, PA-C    Prior Therapy  No      Precautions   Precautions  None      Restrictions   Weight Bearing Restrictions  No  Balance Screen   Has the patient fallen in the past 6 months  Yes    How many times?  1 bad fall; stubles 2x/week    Has the patient had a decrease in activity level because of a fear of falling?   Yes    Is the patient reluctant to leave their home because of a fear of falling?   Yes      [place]  Private residence    Living Arrangements  Spouse/significant other      Prior Function   Level of Independence  Independent    Vocation  Full time employment    Management consultant   Overall Cognitive Status  Within Functional Limits for tasks assessed      Posture/Postural Control   Posture/Postural Control  Postural limitations    Postural Limitations  Rounded Shoulders;Flexed trunk      ROM / Strength   AROM / PROM / Strength  Strength      Strength   Overall Strength Comments  Rt hip flexion and abduction 4-/5; Lt hip flexion and abduction 4/5      Ambulation/Gait   Gait Pattern  Trunk flexed      Standardized Balance  Assessment   Standardized Balance Assessment  Berg Balance Test    Five times sit to stand comments   21 sec      Berg Balance Test   Sit to Stand  Able to stand without using hands and stabilize independently    Standing Unsupported  Able to stand safely 2 minutes    Sitting with Back Unsupported but Feet Supported on Floor or Stool  Able to sit safely and securely 2 minutes    Stand to Sit  Sits safely with minimal use of hands    Transfers  Able to transfer safely, minor use of hands    Standing Unsupported with Eyes Closed  Able to stand 10 seconds safely    Standing Unsupported with Feet Together  Able to place feet together independently and stand 1 minute safely    From Standing, Reach Forward with Outstretched Arm  Can reach forward >12 cm safely (5")    From Standing Position, Pick up Object from Floor  Able to pick up shoe safely and easily    From Standing Position, Turn to Look Behind Over each Shoulder  Looks behind from both sides and weight shifts well    Turn 360 Degrees  Able to turn 360 degrees safely but slowly    Standing Unsupported, Alternately Place Feet on Step/Stool  Able to stand independently and safely and complete 8 steps in 20 seconds   18 sec   Standing Unsupported, One Foot in Front  Able to plae foot ahead of the other independently and hold 30 seconds    Standing on One Leg  Tries to lift leg/unable to hold 3 seconds but remains standing independently    Total Score  49    Berg comment:  increased risk of falls      Functional Gait  Assessment   Gait assessed   Yes    Gait Level Surface  Walks 20 ft in less than 5.5 sec, no assistive devices, good speed, no evidence for imbalance, normal gait pattern, deviates no more than 6 in outside of the 12 in walkway width.    Change in Gait Speed  Able to smoothly change walking speed without  loss of balance or gait deviation. Deviate no more than 6 in outside of the 12 in walkway width.    Gait with Horizontal Head  Turns  Performs head turns smoothly with slight change in gait velocity (eg, minor disruption to smooth gait path), deviates 6-10 in outside 12 in walkway width, or uses an assistive device.    Gait with Vertical Head Turns  Performs head turns with no change in gait. Deviates no more than 6 in outside 12 in walkway width.    Gait and Pivot Turn  Pivot turns safely within 3 sec and stops quickly with no loss of balance.    Step Over Obstacle  Is able to step over one shoe box (4.5 in total height) but must slow down and adjust steps to clear box safely. May require verbal cueing.    Gait with Narrow Base of Support  Ambulates less than 4 steps heel to toe or cannot perform without assistance.    Gait with Eyes Closed  Cannot walk 20 ft without assistance, severe gait deviations or imbalance, deviates greater than 15 in outside 12 in walkway width or will not attempt task.    Ambulating Backwards  Cannot walk 20 ft without assistance, severe gait deviations or imbalance, deviates greater than 15 in outside 12 in walkway width or will not attempt task.    Steps  Alternating feet, must use rail.    Total Score  17    FGA comment:  increased risk of falls                Objective measurements completed on examination: See above findings.      [facility] Adult PT Treatment/Exercise - 10/29/19 0001      Self-Care   Self-Care  Other Self-Care Comments    Other Self-Care Comments   Access Code: [email or web address] educated and performed intial HEP             PT Education - 10/29/19 1210    Education Details  Access Code: [email or web address]    Person(s) Educated  Patient    Methods  Explanation;Demonstration;Handout;Verbal cues    Comprehension  Verbalized understanding;Returned demonstration       PT Short Term Goals - 10/29/19 1728      PT SHORT TERM GOAL #1   Title  Pt will be abel to stand single leg at least 5 sec bilateral due to imrpoved LE strength    Baseline  1-3 sec    Time  4     Period  Weeks    Status  New    Target Date  11/26/19      PT SHORT TERM GOAL #2   Title  Pt will demonstrate 5 x sit to stand in <16 sec    Baseline  21 sec    Time  4    Period  Weeks    Status  New    Target Date  11/26/19      PT SHORT TERM GOAL #3   Title  Pt will not stumble due to not picking up her feet more than 1x/week    Baseline  at least 2x/ week    Time  4    Period  Weeks    Status  New    Target Date  11/19/19        PT Long Term Goals - 10/29/19 1215      PT LONG TERM GOAL #1   Title  Pt will be able  to demonstrate dynamic gait with head turns and no change in gait speed    Baseline  slows down with head turns    Time  12    Period  Weeks    Status  New    Target Date  01/21/20      PT LONG TERM GOAL #2   Title  Pt will demonstrate 5 x sit to stand in <12 sec for reduced risk of falls    Baseline  21 sec    Time  12    Period  Weeks    Status  New    Target Date  01/21/20      PT LONG TERM GOAL #3   Title  Pt will demonstrate Berg balance of >54/56    Baseline  49/56    Time  12    Period  Weeks    Status  New    Target Date  01/21/20      PT LONG TERM GOAL #4   Title  Pt will be ind with HEP to maintain strength and improved balance for reduced risk of falls             Plan - 10/29/19 1729    Clinical Impression Statement  Pt presents to skilled PT due to frequent falls.  Has had bad falls resulting in pelvic fracture and more recently had a concussion.  Pt performed 5 x sit to stand, Berg, FGA with results as detailed above.  All tests demonstrate decreased balance and increased risk of falls. She does not use AD and based on test results may be okay without AD at this time.  Pt would like to be able to go hiking eventually but is not advised to do that at this time.  Pt has LE weakness and bilateral THA.  She is weaker in the Rt LE than the Lt. Pt will benefit from skilled PT to address impairments so she can reutrn to full activity  level without increased risk or fear of falling.    Personal Factors and Comorbidities  Past/Current Experience    Examination-Activity Limitations  Locomotion Level    Examination-Participation Restrictions  Community Activity    Stability/Clinical Decision Making  Stable/Uncomplicated    Clinical Decision Making  Low    Rehab Potential  Good    PT Frequency  2x / week    PT Duration  12 weeks   starting with 6 weeks and see how it goes   PT Treatment/Interventions  ADLs/Self Care Home Management;Aquatic Therapy;Biofeedback;Moist Heat;Electrical Stimulation;Cryotherapy;Gait Scientist, forensic;Therapeutic activities;Therapeutic exercise;Balance training;Neuromuscular re-education;Patient/family education;Manual techniques;Taping;Dry needling;Passive range of motion    PT Next Visit Plan  standing and dynamic balance activities; bilat hip and knee strength; review intial HEP; endurance with walking    PT Home Exercise Plan  Access Code: [email or web address]    Consulted and Agree with Plan of Care  Patient       Patient will benefit from skilled therapeutic intervention in order to improve the following deficits and impairments:  Difficulty walking, Decreased balance, Decreased strength  Visit Diagnosis: Repeated falls  Muscle weakness (generalized)     Problem List Patient Active Problem List   Diagnosis Date Noted  . Fracture of superior pubic ramus ([place]) 11/28/2018  . Stricture and stenosis of esophagus 05/16/2012  . Hiatal hernia 05/16/2012  . Dysphagia, unspecified(787.20) 05/15/2012  . Cancer ([place])   . Depression   . Bipolar 1 disorder ([place])   . Anemia   . ANEMIA,  IRON DEFICIENCY 09/14/2010  . GERD 09/14/2010  . Personal history of colonic polyps 09/14/2010    Jule Ser, PT 10/29/2019, 5:42 PM  [facility] [address] [place], [place], [address] Phone: [phone]   Fax:  [phone]  Name: DELIA SLATTEN MRN: [id number] Date of Birth: [birth year]

## 2019-10-29 NOTE — Patient Instructions (Signed)
Access Code: [email or web address]  URL: [email or web address]  Date: 10/29/2019  Prepared by: Jari Favre   Exercises Single Leg Stance - 10 reps - 1 sets - as long as you can hold - 1x daily - 7x weekly Tandem Stance - 3 reps - 1 sets - 15 seconds hold - 1x daily - 7x weekly Sit to Stand - 10 reps - 1 sets - 3x daily - 7x weekly

## 2019-10-30 ENCOUNTER — Ambulatory Visit: Payer: BC Managed Care – PPO | Admitting: Psychiatry

## 2019-10-31 ENCOUNTER — Ambulatory Visit: Payer: BC Managed Care – PPO | Admitting: Physical Therapy

## 2019-10-31 ENCOUNTER — Other Ambulatory Visit: Payer: Self-pay

## 2019-10-31 DIAGNOSIS — R296 Repeated falls: Secondary | ICD-10-CM | POA: Diagnosis not present

## 2019-10-31 DIAGNOSIS — M6281 Muscle weakness (generalized): Secondary | ICD-10-CM | POA: Diagnosis not present

## 2019-10-31 NOTE — Patient Instructions (Signed)
Access Code: [email or web address]  URL: [email or web address]  Date: 10/31/2019  Prepared by: Ruben Im   Exercises Single Leg Stance - 10 reps - 1 sets - as long as you can hold - 1x daily - 7x weekly Tandem Stance - 3 reps - 1 sets - 15 seconds hold - 1x daily - 7x weekly Sit to Stand - 10 reps - 1 sets - 3x daily - 7x weekly Heel Toe Raises with Counter Support - 10 reps - 1 sets - 1x daily - 7x weekly doorway hip flexor stretch - 10 reps - 1 sets - 1x daily - 7x weekly Forward Step Touch - 10 reps - 1 sets - 1x daily - 7x weekly Side Stepping with Counter Support - 10 reps - 1 sets - 1x daily - 7x weekly

## 2019-10-31 NOTE — Therapy (Signed)
[facility] [address] [place], [place], [address] Phone: [phone]   Fax:  [phone]  Physical Therapy Treatment  Patient Details  Name: Megan Whitney MRN: [id number] Date of Birth: 1956-07-29 Referring Provider (PT): Brunetta Jeans, [place]   Encounter Date: 10/31/2019  PT End of Session - 10/31/19 1748    Visit Number  2    Date for PT Re-Evaluation  01/21/20    Authorization Type  [employer]    PT Start Time  0723    PT Stop Time  0801    PT Time Calculation (min)  38 min    Activity Tolerance  Patient tolerated treatment well       Past Medical History:  Diagnosis Date  . Allergy    Seasonal  . Anemia    History of GI blood loss  . Anxiety   . Arthritis   . Bipolar 1 disorder ([place])   . Colon polyps   . Depression   . Epistaxis    Around 2011 or 2012, required cauterization.   . Esophageal stricture   . GERD (gastroesophageal reflux disease)   . Headache(784.0)   . Hyperlipidemia   . Interstitial cystitis   . Lung cancer ([place]) 2002  . Obesity   . Sleep apnea    Doesn't use a CPAP    Past Surgical History:  Procedure Laterality Date  . BALLOON DILATION  05/16/2012   Procedure: BALLOON DILATION;  Surgeon: Inda Castle, MD;  Location: [place];  Service: Endoscopy;  Laterality: N/A;  . COLONOSCOPY    . ENTEROSCOPY  05/16/2012   Procedure: ENTEROSCOPY;  Surgeon: Inda Castle, MD;  Location: [place];  Service: Endoscopy;  Laterality: N/A;  . JOINT REPLACEMENT    . right shoulder durgery 25 yrs ago    . TOTAL HIP ARTHROPLASTY  2006, 2008   bilateral  . TUBAL LIGATION    . WEDGE RESECTION  2002   lung cancer    There were no vitals filed for this visit.  Subjective Assessment - 10/31/19 0725    Subjective  No hip pain now but at night it gets up to a 7/10.  Groin pain.  I can tell the ex's have already helped.    Pertinent History  bilater THA 2006 and 2007; polypharmacy  (Pt states this may be part of the reason for balance issues);  works at [employer]    Patient Stated Goals  be able to return to walking daily and walk without shuffling; be able to hike; not be afraid to go out and walk;  work out a program here so I can join a gym after PT    Currently in Pain?  No/denies                       [facility] Adult PT Treatment/Exercise - 10/31/19 0001      Therapeutic Activites    Other Therapeutic Activities  sit to stand, steps, curbs, walking, standing longer periods of time      Knee/Hip Exercises: Stretches   Active Hamstring Stretch  Right;Left;5 reps    Active Hamstring Stretch Limitations  2nd step     Hip Flexor Stretch  Right;Left;5 reps    Hip Flexor Stretch Limitations  on 2nd step with same side UE elevation      Knee/Hip Exercises: Aerobic   Nustep  L1 5 min while discussing HEP  Knee/Hip Exercises: Standing   Heel Raises  Both;10 reps    Heel Raises Limitations  alternating heels/toes counter support     Other Standing Knee Exercises  step taps alternating on steps and in the lower cabinet shelf 2x 10     Other Standing Knee Exercises  side stepping at the counter with UEs hovering above counter 4 laps       Knee/Hip Exercises: Seated   Sit to Sand  2 sets;10 reps;without UE support   2nd set performed with holding 5# weight             PT Education - 10/31/19 1747    Education Details  Access Code: [email or web address]  sit to stand holding 5# object;  heel/toe raises; doorway hip flexor stretch, step taps, side step at counter    Person(s) Educated  Patient    Methods  Explanation;Demonstration;Handout    Comprehension  Returned demonstration;Verbalized understanding       PT Short Term Goals - 10/29/19 1728      PT SHORT TERM GOAL #1   Title  Pt will be abel to stand single leg at least 5 sec bilateral due to imrpoved LE strength    Baseline  1-3 sec    Time  4    Period  Weeks    Status  New    Target  Date  11/26/19      PT SHORT TERM GOAL #2   Title  Pt will demonstrate 5 x sit to stand in <16 sec    Baseline  21 sec    Time  4    Period  Weeks    Status  New    Target Date  11/26/19      PT SHORT TERM GOAL #3   Title  Pt will not stumble due to not picking up her feet more than 1x/week    Baseline  at least 2x/ week    Time  4    Period  Weeks    Status  New    Target Date  11/19/19        PT Long Term Goals - 10/29/19 1215      PT LONG TERM GOAL #1   Title  Pt will be able to demonstrate dynamic gait with head turns and no change in gait speed    Baseline  slows down with head turns    Time  12    Period  Weeks    Status  New    Target Date  01/21/20      PT LONG TERM GOAL #2   Title  Pt will demonstrate 5 x sit to stand in <12 sec for reduced risk of falls    Baseline  21 sec    Time  12    Period  Weeks    Status  New    Target Date  01/21/20      PT LONG TERM GOAL #3   Title  Pt will demonstrate Berg balance of >54/56    Baseline  49/56    Time  12    Period  Weeks    Status  New    Target Date  01/21/20      PT LONG TERM GOAL #4   Title  Pt will be ind with HEP to maintain strength and improved balance for reduced risk of falls            Plan - 10/31/19 2202  Clinical Impression Statement  The patient is highly motivated with PT to improve her balance and decrease her frequent falls.  She reports being fearful of steps/curbs given her most recent fall was the result of her toe catching on the curb.  She states she often finds herself shuffling her feet.  Exercises were progressed with intensity and duration.  Therapist providing close supervision for safety and to monitor response.  Encouraged her to perform her home ex's close to a stable surface like the kitchen counter for safety at home.    Rehab Potential  Good    PT Frequency  2x / week    PT Duration  12 weeks    PT Treatment/Interventions  ADLs/Self Care Home Management;Aquatic  Therapy;Biofeedback;Moist Heat;Electrical Stimulation;Cryotherapy;Gait Scientist, forensic;Therapeutic activities;Therapeutic exercise;Balance training;Neuromuscular re-education;Patient/family education;Manual techniques;Taping;Dry needling;Passive range of motion    PT Next Visit Plan  standing and dynamic balance activities; bilat hip and knee strength; continue to progress HEP; endurance with walking    PT Home Exercise Plan  Access Code: [email or web address]       Patient will benefit from skilled therapeutic intervention in order to improve the following deficits and impairments:  Difficulty walking, Decreased balance, Decreased strength  Visit Diagnosis: Repeated falls  Muscle weakness (generalized)     Problem List Patient Active Problem List   Diagnosis Date Noted  . Fracture of superior pubic ramus ([place]) 11/28/2018  . Stricture and stenosis of esophagus 05/16/2012  . Hiatal hernia 05/16/2012  . Dysphagia, unspecified(787.20) 05/15/2012  . Cancer ([place])   . Depression   . Bipolar 1 disorder ([place])   . Anemia   . ANEMIA, IRON DEFICIENCY 09/14/2010  . GERD 09/14/2010  . Personal history of colonic polyps 09/14/2010   Ruben Im, PT 10/31/19 5:55 PM Phone: [phone] Fax: [phone] Alvera Singh 10/31/2019, 5:55 PM  [facility] [address] [place], [place], [address] Phone: [phone]   Fax:  [phone]  Name: Megan Whitney MRN: [id number] Date of Birth: 03/30/1956

## 2019-11-02 ENCOUNTER — Other Ambulatory Visit: Payer: Self-pay | Admitting: Psychiatry

## 2019-11-02 DIAGNOSIS — F411 Generalized anxiety disorder: Secondary | ICD-10-CM

## 2019-11-02 DIAGNOSIS — F319 Bipolar disorder, unspecified: Secondary | ICD-10-CM

## 2019-11-04 ENCOUNTER — Ambulatory Visit (INDEPENDENT_AMBULATORY_CARE_PROVIDER_SITE_OTHER): Payer: BC Managed Care – PPO | Admitting: Psychiatry

## 2019-11-04 ENCOUNTER — Other Ambulatory Visit: Payer: Self-pay

## 2019-11-04 ENCOUNTER — Encounter: Payer: Self-pay | Admitting: Psychiatry

## 2019-11-04 DIAGNOSIS — F900 Attention-deficit hyperactivity disorder, predominantly inattentive type: Secondary | ICD-10-CM

## 2019-11-04 DIAGNOSIS — F319 Bipolar disorder, unspecified: Secondary | ICD-10-CM | POA: Diagnosis not present

## 2019-11-04 DIAGNOSIS — F411 Generalized anxiety disorder: Secondary | ICD-10-CM | POA: Diagnosis not present

## 2019-11-04 DIAGNOSIS — G251 Drug-induced tremor: Secondary | ICD-10-CM

## 2019-11-04 DIAGNOSIS — R251 Tremor, unspecified: Secondary | ICD-10-CM

## 2019-11-04 DIAGNOSIS — R296 Repeated falls: Secondary | ICD-10-CM

## 2019-11-04 DIAGNOSIS — S060X0S Concussion without loss of consciousness, sequela: Secondary | ICD-10-CM

## 2019-11-04 DIAGNOSIS — R7989 Other specified abnormal findings of blood chemistry: Secondary | ICD-10-CM

## 2019-11-04 DIAGNOSIS — G3184 Mild cognitive impairment, so stated: Secondary | ICD-10-CM

## 2019-11-04 MED ORDER — FLUOXETINE HCL 20 MG PO CAPS: 20.0000 mg | ORAL_CAPSULE | Freq: Every day | ORAL | 2 refills | Status: DC

## 2019-11-04 MED ORDER — DEXMETHYLPHENIDATE HCL ER 25 MG PO CP24: 1.0000 | ORAL_CAPSULE | ORAL | 0 refills | Status: DC

## 2019-11-04 MED ORDER — DEXMETHYLPHENIDATE HCL 10 MG PO TABS: 10.0000 mg | ORAL_TABLET | Freq: Every day | ORAL | 0 refills | Status: DC

## 2019-11-04 MED ORDER — CARBAMAZEPINE ER 200 MG PO CP12: 200.0000 mg | ORAL_CAPSULE | Freq: Every day | ORAL | 1 refills | Status: DC

## 2019-11-04 MED ORDER — LITHIUM CARBONATE 150 MG PO CAPS: 150.0000 mg | ORAL_CAPSULE | Freq: Every day | ORAL | 1 refills | Status: DC

## 2019-11-04 MED ORDER — DEXMETHYLPHENIDATE HCL ER 25 MG PO CP24: 25.0000 mg | ORAL_CAPSULE | Freq: Every day | ORAL | 0 refills | Status: DC

## 2019-11-04 MED ORDER — CARBAMAZEPINE 100 MG PO CHEW: 100.0000 mg | CHEWABLE_TABLET | Freq: Every day | ORAL | 1 refills | Status: DC

## 2019-11-04 MED ORDER — CARIPRAZINE HCL 3 MG PO CAPS: 3.0000 mg | ORAL_CAPSULE | Freq: Every day | ORAL | 2 refills | Status: DC

## 2019-11-04 NOTE — Progress Notes (Signed)
Megan Whitney [id number] [birth year] 64 y.o.     Subjective:   Patient ID:  Megan Whitney is a 64 y.o. (DOB 04/22/1956) female.   Chief Complaint:  Chief Complaint  Patient presents with  . Follow-up  . Medication Problem    balance problems  . Anxiety  . Depression    Depression        Associated symptoms include no decreased concentration and no suicidal ideas.  Past medical history includes anxiety.   Anxiety Symptoms include nervous/anxious behavior. Patient reports no confusion, decreased concentration, dizziness, nausea or suicidal ideas.    Medication Refill Associated symptoms include arthralgias. Pertinent negatives include no nausea or weakness.   lSusan GUSTAVIA Whitney is contacted for follow-up of recent problems with falling and chronic mood swings and anxiety and changes in medications made at the last visit.   At visit December 27, 2018.  Focalin XR was increased from 20 mg to 25 mg daily to help with focus and attention and potentially mood.  When seen February 13, 2019.  In an effort to reduce mood cycling we reduce fluoxetine to 20 mg daily.  At visit August 2020.  No meds were changed.  She continued the following: Focalin XR 25 mg every morning and Focalin 10 mg immediate release daily Equetro 200 mg nightly Fluoxetine 20 mg daily Lamotrigine 200 mg twice daily Lithium 150 mg nightly Vraylar 3 mg daily  She called back November 4 after seeing her therapist stating that she was having some hypomanic symptoms with reduced sleep and increased energy.  This potentiality had been discussed and the decision was made to increase Equetro from 200 mg nightly to 300 mg nightly.  seen August 12, 2019.  Because of balance problems she did not tolerate Equetro 300 mg nightly and it was changed to Equetro 200 mg nightly plus immediate release carbamazepine 100 mg nightly.  Her mood had not been stable enough on Equetro 200 mg nightly alone. Less balance problems with change  in CBZ.  Last seen September 23, 2019.  The following was changed: For bipolar mixed increase CBZ IR to 200 mg HS.  Disc fall and balance risks.For bipolar mixed increase CBZ IR to 200 mg HS.  Disc fall and balance risks.  She called back October 23, 2019 stating she had had another fall and felt it was due to the medication.  Therefore carbamazepine immediate release was reduced from 200 mg nightly to 100 mg nightly.  The Equetro is unchanged.   Better at the moment but balance is still somewhat of a problem.  Started PT to help balance.  Had a fall after tripping on a curb and hit her head on sidewalk.  Got a concussion with nausea and HA and dizziness and light sensitivity.  Not over it.  Concentration problems.  Has gotten back to work after a week.    Mood sx pretty good with some mild depression.  Nothing severe.  Trying to minimize stress and self care as much as possible.  No manic sx lately and sleeping fairly well.  No racing thoughts.    Working another year and plans to retire but H alcoholic and not sure it will be good to be there all the time.  Seeing therapist q 2 weeks.  Therapy helping . Recent serum vitamin D level was determined to be low at 33.  The goal and chronically depressed patient's is in the 50s if possible.  So her vitamin D was increased  on August 08, 2018 or thereabouts.  Checked vitamin D level again and this time it was high at 120 and so it was stopped.  She's restarted per PCP at 1000 units daily.  Past Psychiatric Medication Trials: Vrayla 4.5 SE mouth movements reduced to 3 mg 3/20 lithium 150,  Latuda 80, Trileptal 450, olanzapine, Seroquel, risperidone, Abilify Focalin,  Ritalin,  Depakote, Equetro 300 balance issues, Lamictal 200 twice daily, fluoxetine 60,  buspirone, sertraline 100, Wellbutrin history of facial tics, paroxetine cognitive side effects ropinirole, amantadine, Sinemet,Artane, Cogentin,  trazodone hangover, Ambien hangover,  Review of  Systems:  Review of Systems  HENT: Positive for tinnitus.        Chirping cricket sounds in hears since January  Gastrointestinal: Negative for nausea.  Musculoskeletal: Positive for arthralgias. Negative for gait problem.  Neurological: Positive for tremors. Negative for dizziness, seizures, syncope and weakness.       Others see HPI  Psychiatric/Behavioral: Positive for depression. Negative for agitation, behavioral problems, confusion, decreased concentration, dysphoric mood, hallucinations, self-injury, sleep disturbance and suicidal ideas. The patient is nervous/anxious. The patient is not hyperactive.   No falls since here. Not currently depressed but unable to remove this from the list.  Medications: I have reviewed the patient's current medications.  Current Outpatient Medications  Medication Sig Dispense Refill  . atorvastatin (LIPITOR) 10 MG tablet Take 10 mg by mouth daily.    . cetirizine (ZYRTEC) 10 MG tablet Take 10 mg by mouth daily.    . Cholecalciferol (VITAMIN D3 PO) Take 1,000 tablets by mouth daily.    . ferrous gluconate (FERGON) 324 MG tablet Take 324 mg by mouth daily with breakfast.    . lamoTRIgine (LAMICTAL) 200 MG tablet TAKE 1 TABLET(200 MG) BY MOUTH TWICE DAILY 60 tablet 2  . meclizine (ANTIVERT) 25 MG tablet Take 1 tablet (25 mg total) by mouth 3 (three) times daily as needed for dizziness. 30 tablet 0  . Multiple Vitamin (MULTIVITAMIN) tablet Take 1 tablet by mouth daily.    Marland Kitchen omeprazole (PRILOSEC) 20 MG capsule Take 20 mg by mouth at bedtime.    . ondansetron (ZOFRAN ODT) 4 MG disintegrating tablet Take 1 tablet (4 mg total) by mouth every 8 (eight) hours as needed for nausea or vomiting. 20 tablet 0  . pantoprazole (PROTONIX) 40 MG tablet Take 1 tablet (40 mg total) by mouth daily. 90 tablet 0  . carbamazepine (EQUETRO) 200 MG CP12 12 hr capsule Take 1 capsule (200 mg total) by mouth at bedtime. 30 capsule 1  . carbamazepine (TEGRETOL) 100 MG chewable tablet  Chew 1 tablet (100 mg total) by mouth at bedtime. 90 tablet 1  . cariprazine (VRAYLAR) capsule Take 1 capsule (3 mg total) by mouth daily. 30 capsule 2  . dexmethylphenidate (FOCALIN) 10 MG tablet Take 1 tablet (10 mg total) by mouth daily. 30 tablet 0  . [START ON 12/02/2019] dexmethylphenidate (FOCALIN) 10 MG tablet Take 1 tablet (10 mg total) by mouth daily. 30 tablet 0  . Dexmethylphenidate HCl (FOCALIN XR) 25 MG CP24 Take 1 capsule by mouth every morning. 30 capsule 0  . [START ON 12/02/2019] Dexmethylphenidate HCl 25 MG CP24 Take 25 mg by mouth daily. 30 capsule 0  . FLUoxetine (PROZAC) 20 MG capsule Take 1 capsule (20 mg total) by mouth daily. 30 capsule 2  . lithium carbonate 150 MG capsule Take 1 capsule (150 mg total) by mouth daily in the afternoon. 90 capsule 1   No current facility-administered medications for  this visit.    Medication Side Effects: Other: tremor and weight gain.  Dyskinesia appears better  SE bettter than they were.  Balance problems intermittently  Allergies:  Allergies  Allergen Reactions  . Azithromycin Anaphylaxis  . Penicillins Anaphylaxis    DID THE REACTION INVOLVE: Swelling of the face/tongue/throat, SOB, or low BP? Yes Sudden or severe rash/hives, skin peeling, or the inside of the mouth or nose? Yes Did it require medical treatment? No When did it last happen? If all above answers are "NO", may proceed with cephalosporin use.  . Adhesive [Tape] Other (See Comments)    On bandaids    Past Medical History:  Diagnosis Date  . Allergy    Seasonal  . Anemia    History of GI blood loss  . Anxiety   . Arthritis   . Bipolar 1 disorder ([place])   . Colon polyps   . Depression   . Epistaxis    Around 2011 or 2012, required cauterization.   . Esophageal stricture   . GERD (gastroesophageal reflux disease)   . Headache(784.0)   . Hyperlipidemia   . Interstitial cystitis   . Lung cancer ([place]) 2002  . Obesity   . Sleep apnea    Doesn't use  a CPAP    Family History  Problem Relation Age of Onset  . Arthritis Mother   . Hearing loss Mother   . Hyperlipidemia Mother   . Hypertension Mother   . Hypertension Father   . Diabetes Mellitus II Father   . Heart disease Father   . Arthritis Father   . Cancer Father        Brain  . COPD Father   . Diabetes Father   . Hyperlipidemia Father   . Early death Sister        Aneroxia/Bulimic  . Depression Brother   . Early death Investment banker, corporate  . Depression Daughter   . Drug abuse Daughter   . Heart disease Daughter   . Hypertension Daughter   . Stroke Maternal Grandmother   . Hypertension Maternal Grandmother   . Arthritis Maternal Grandfather   . Heart attack Maternal Grandfather   . Hearing loss Maternal Grandfather   . Colon cancer Neg Hx   . Esophageal cancer Neg Hx   . Rectal cancer Neg Hx     Social History   Socioeconomic History  . Marital status: Married    Spouse name: Not on file  . Number of children: 1  . Years of education: Not on file  . Highest education level: Not on file  Occupational History  . Occupation: Admin. assistant  Tobacco Use  . Smoking status: Never Smoker  . Smokeless tobacco: Never Used  Substance and Sexual Activity  . Alcohol use: Yes    Alcohol/week: 1.0 standard drinks    Types: 1 Glasses of wine per week    Comment: Moderate  . Drug use: No  . Sexual activity: Not Currently  Other Topics Concern  . Not on file  Social History Narrative  . Not on file   Social Determinants of Health   Financial Resource Strain:   . Difficulty of Paying Living Expenses: Not on file  Food Insecurity:   . Worried About Charity fundraiser in the Last Year: Not on file  . Ran Out of Food in the Last Year: Not on file  Transportation Needs:   . Lack of Transportation (Medical): Not  on file  . Lack of Transportation (Non-Medical): Not on file  Physical Activity:   . Days of Exercise per Week: Not on file  . Minutes of  Exercise per Session: Not on file  Stress:   . Feeling of Stress : Not on file  Social Connections:   . Frequency of Communication with Friends and Family: Not on file  . Frequency of Social Gatherings with Friends and Family: Not on file  . Attends Religious Services: Not on file  . Active Member of Clubs or Organizations: Not on file  . Attends Archivist Meetings: Not on file  . Marital Status: Not on file  Intimate Partner Violence:   . Fear of Current or Ex-Partner: Not on file  . Emotionally Abused: Not on file  . Physically Abused: Not on file  . Sexually Abused: Not on file    Past Medical History, Surgical history, Social history, and Family history were reviewed and updated as appropriate.   Please see review of systems for further details on the patient's review from today.   Objective:   Physical Exam:  There were no vitals taken for this visit.  Physical Exam Constitutional:      General: She is not in acute distress.    Appearance: She is well-developed.  Musculoskeletal:        General: No deformity.  Neurological:     Mental Status: She is alert and oriented to person, place, and time.     Cranial Nerves: No dysarthria.     Motor: Tremor present.     Coordination: Coordination normal.  Psychiatric:        Attention and Perception: Attention and perception normal. She does not perceive auditory or visual hallucinations.        Mood and Affect: Mood is anxious and depressed. Affect is not labile, blunt, angry, tearful or inappropriate.        Speech: Speech normal. Speech is not rapid and pressured or slurred.        Behavior: Behavior normal. Behavior is cooperative.        Thought Content: Thought content normal. Thought content is not paranoid or delusional. Thought content does not include homicidal or suicidal ideation. Thought content does not include homicidal or suicidal plan.        Cognition and Memory: Cognition normal. She exhibits  impaired recent memory.        Judgment: Judgment normal.     Comments: Insight fair. No manic sx     Lab Review:     Component Value Date/Time   NA 141 04/04/2019 1004   K 4.5 04/04/2019 1004   CL 104 04/04/2019 1004   CO2 29 04/04/2019 1004   GLUCOSE 88 04/04/2019 1004   BUN 28 (H) 04/04/2019 1004   CREATININE 0.83 04/04/2019 1004   CALCIUM 9.9 04/04/2019 1004   PROT 7.0 04/04/2019 1004   ALBUMIN 4.5 04/04/2019 1004   AST 19 04/04/2019 1004   ALT 13 04/04/2019 1004   ALKPHOS 70 04/04/2019 1004   BILITOT 0.3 04/04/2019 1004   GFRNONAA >60 10/21/2018 0858   GFRAA >60 10/21/2018 0858       Component Value Date/Time   WBC 7.7 04/04/2019 1004   RBC 4.52 04/04/2019 1004   HGB 13.9 04/04/2019 1004   HCT 41.7 04/04/2019 1004   PLT 242.0 04/04/2019 1004   MCV 92.2 04/04/2019 1004   MCH 29.2 10/21/2018 0858   MCHC 33.3 04/04/2019 1004   RDW 14.4  04/04/2019 1004   LYMPHSABS 2.3 04/04/2019 1004   MONOABS 0.7 04/04/2019 1004   EOSABS 1.0 (H) 04/04/2019 1004   BASOSABS 0.1 04/04/2019 1004  Vitamin D level 33 on 10K units daily on 12/4/`9 Increased to prescription vitamin d 50K units Monday, Wed, Friday.  Rx sent in.   Lithium Lvl  Date Value Ref Range Status  10/21/2018 0.18 (L) 0.60 - 1.20 mmol/L Final    Comment:    Performed at [facility], [address] [place], [place] [address]     No results found for: PHENYTOIN, PHENOBARB, VALPROATE, CBMZ   .res Assessment: Plan:    Bipolar I disorder with rapid cycling ([place]) - Plan: carbamazepine (TEGRETOL) 100 MG chewable tablet, cariprazine (VRAYLAR) capsule, carbamazepine (EQUETRO) 200 MG CP12 12 hr capsule  Generalized anxiety disorder - Plan: cariprazine (VRAYLAR) capsule  Attention deficit hyperactivity disorder (ADHD), predominantly inattentive type - Plan: dexmethylphenidate (FOCALIN) 10 MG tablet, dexmethylphenidate (FOCALIN) 10 MG tablet, Dexmethylphenidate HCl (FOCALIN XR) 25 MG CP24, Dexmethylphenidate HCl 25  MG CP24  Tremor due to multiple drugs  Mild cognitive impairment  Concussion without loss of consciousness, sequela (HCC)  Tremor of both hands  Falling  Low vitamin D level  Greater than 50% of 30 min face to face time with patient was spent on counseling and coordination of care. We discussed the following: Karla has chronic rapid cycling bipolar disorder which is chronically unstable and has been difficult to control.  The rapid cycling is making it difficult to control frequency of depressive episodes and the anxiety as well.  We have typically had to make frequent med changes.  We have had to reduce mood stabilizer dosages including Vraylar and Equetro and and then reduced fluoxetine which may be contributing to the rapid cycling.    Since last year when she was stable she started having hypomanic symptoms again.  We increased Equetro back to 300 mg nightly but she she has had balance problems.  It had stopped the manic symptoms but then she started having balance problems again and we had to switch it back to Equetro 300 and add carbamazepine immediate release 100 nightly.. Less neurological problems with reduction in Vraylar.   It could have been her prior neurologic symptoms were related to the combination of medications.  At the last appointment mixed sx were worse.  Tolerating Equetro 200 and CBZ IR 100 mg HS with other meds at this point.  So carbamazepine immediate release was increased to 200 mg nightly.  She doesn't feel she can do without the fluoxetine though disc risk cycling. Unfortunately she had more dizziness and had a fall.  The carbamazepine did help with the mixed symptoms and cycling but she could not tolerate the degree of dizziness and reduce the dose of the immediate release back to 100 nightly.  Her balance is better with the reduction in dosage.  So far her mixed symptoms or manic symptoms have not recurred.  Reviewed her medication history and it does not appear  she is taking any form of short acting carbamazepine.  We could buy the short acting and long-acting carbamazepine in order to try to reduce the side effects but still keep benefit.  ADD managed with Focalin.  Discussed the risk of stimulants including that that could contribute to mood instability and mood swings.  She appears to need it in order to function effectively at work.  She has a high residual anxiety.  It has been impossible to control all of  her symptoms simultaneously without causing side effects.  She agrees.  Continue the following Discussed side effects of each medicine. Focalin XR 25 mg every morning and Focalin 10 mg immediate release daily Equetro 200 mg nightly Carbamazepine immediate release 100 mg nightly Fluoxetine 20 mg daily Lamotrigine 200 mg twice daily Lithium 150 mg nightly Vraylar 3 mg daily  Disc the head CT results bc she had questions about the stable mild atrophy.  BC new onset of weeks of "chirping" sounds constantly in her both ears consider ENT evaluation given also having worsening balance issues.  Consider inner ear problems.  Discussed potential metabolic side effects associated with atypical antipsychotics, as well as potential risk for movement side effects. Advised pt to contact office if movement side effects occur.  She may be having some mild TD with toe movement.  Disc SE meds and this is heightened by the complication of necessary polypharmacy.  FU results of PT for balance.    Supportive therapy in terms of dealing with husband's addiction.  She's interested in Bipolar Support Group and info given.  Requires frequent FU DT chronic instability.  FU 6 weeks.  Lynder Parents MD, DFAPA  Please see After Visit Summary for patient specific instructions.  Future Appointments  Date Time Provider [place]  11/05/2019  7:30 AM Danie Binder, PT [facility]  11/07/2019  7:30 AM Danie Binder, PT [facility]  11/12/2019  7:30  AM Danie Binder, PT [facility]  11/13/2019  9:00 AM Shanon Ace, LCSW [facility] None  11/14/2019  7:30 AM Danie Binder, PT [facility]  11/19/2019  7:30 AM Danie Binder, PT [facility]  11/21/2019  7:30 AM Danie Binder, PT [facility]  11/26/2019  7:30 AM Danie Binder, PT [facility]  11/27/2019  9:00 AM Shanon Ace, LCSW [facility] None  11/28/2019  7:30 AM Danie Binder, PT [facility]  12/05/2019  7:30 AM Danie Binder, PT [facility]  12/11/2019  8:00 AM Shanon Ace, LCSW [facility] None  12/25/2019  8:00 AM Shanon Ace, LCSW [facility] None  01/06/2020  4:30 PM Cottle, [place]., MD [facility] None  01/31/2020  8:15 AM Felipa Furnace, DPM [facility]    No orders of the defined types were placed in this encounter.     -------------------------------

## 2019-11-05 ENCOUNTER — Ambulatory Visit: Payer: BC Managed Care – PPO | Attending: Physician Assistant

## 2019-11-05 ENCOUNTER — Ambulatory Visit: Payer: BC Managed Care – PPO | Admitting: Physical Therapy

## 2019-11-05 ENCOUNTER — Other Ambulatory Visit: Payer: Self-pay

## 2019-11-05 DIAGNOSIS — R296 Repeated falls: Secondary | ICD-10-CM | POA: Diagnosis not present

## 2019-11-05 DIAGNOSIS — M6281 Muscle weakness (generalized): Secondary | ICD-10-CM | POA: Diagnosis not present

## 2019-11-05 NOTE — Patient Instructions (Signed)
Access Code: [email or web address]  URL: [email or web address]  Date: 11/05/2019  Prepared by: Sigurd Sos   Exercises  Hip Flexor Stretch on Step - 3 reps - 1 sets - 20 hold - 2x daily - 7x weekly

## 2019-11-05 NOTE — Therapy (Signed)
[facility] [address], [place], [place], [address] Phone: [phone]   Fax:  [phone]  Physical Therapy Treatment  Patient Details  Name: Megan Whitney MRN: [id number] Date of Birth: 11-Sep-1955 Referring Provider (PT): Brunetta Jeans, [place]   Encounter Date: 11/05/2019  PT End of Session - 11/05/19 0759    Visit Number  3    Date for PT Re-Evaluation  01/21/20    Authorization Type  BCBS    PT Start Time  0719    PT Stop Time  0758    PT Time Calculation (min)  39 min    Activity Tolerance  Patient tolerated treatment well    Behavior During Therapy  [facility] for tasks assessed/performed       Past Medical History:  Diagnosis Date  . Allergy    Seasonal  . Anemia    History of GI blood loss  . Anxiety   . Arthritis   . Bipolar 1 disorder ([place])   . Colon polyps   . Depression   . Epistaxis    Around 2011 or 2012, required cauterization.   . Esophageal stricture   . GERD (gastroesophageal reflux disease)   . Headache(784.0)   . Hyperlipidemia   . Interstitial cystitis   . Lung cancer ([place]) 2002  . Obesity   . Sleep apnea    Doesn't use a CPAP    Past Surgical History:  Procedure Laterality Date  . BALLOON DILATION  05/16/2012   Procedure: BALLOON DILATION;  Surgeon: Inda Castle, MD;  Location: [place];  Service: Endoscopy;  Laterality: N/A;  . COLONOSCOPY    . ENTEROSCOPY  05/16/2012   Procedure: ENTEROSCOPY;  Surgeon: Inda Castle, MD;  Location: [place];  Service: Endoscopy;  Laterality: N/A;  . JOINT REPLACEMENT    . right shoulder durgery 25 yrs ago    . TOTAL HIP ARTHROPLASTY  2006, 2008   bilateral  . TUBAL LIGATION    . WEDGE RESECTION  2002   lung cancer    There were no vitals filed for this visit.  Subjective Assessment - 11/05/19 0724    Subjective  I have been doing my exercises.    Currently in Pain?  No/denies         [facility] PT Assessment - 11/05/19  0001      Standardized Balance Assessment   Five times sit to stand comments   11.86 seconds                    [facility] Adult PT Treatment/Exercise - 11/05/19 0001      Therapeutic Activites    Other Therapeutic Activities  sit to stand, steps, curbs, walking, standing longer periods of time      Knee/Hip Exercises: Stretches   Active Hamstring Stretch  Right;Left;2 reps;20 seconds    Active Hamstring Stretch Limitations  seated       Knee/Hip Exercises: Aerobic   Nustep  L2 x 6 min while discussing HEP       Knee/Hip Exercises: Standing   Heel Raises  Both;2 sets;10 reps    Heel Raises Limitations  alternating step taps on 6" step    Rebounder  weight shifting 3 ways x 1 minute each    Other Standing Knee Exercises  side stepping at the counter, tandem line walk with UEs hovering above counter 4 laps       Knee/Hip Exercises: Seated   Sit to  Sand  2 sets;10 reps;without UE support   2nd set performed with holding 5# weight             PT Education - 11/05/19 0759    Education Details  Access Code: [email or web address]    Person(s) Educated  Patient    Methods  Explanation;Demonstration;Handout    Comprehension  Verbalized understanding;Returned demonstration       PT Short Term Goals - 11/05/19 0726      PT SHORT TERM GOAL #3   Title  Pt will not stumble due to not picking up her feet more than 1x/week    Baseline  2-3 reported    Status  On-going        PT Long Term Goals - 10/29/19 1215      PT LONG TERM GOAL #1   Title  Pt will be able to demonstrate dynamic gait with head turns and no change in gait speed    Baseline  slows down with head turns    Time  12    Period  Weeks    Status  New    Target Date  01/21/20      PT LONG TERM GOAL #2   Title  Pt will demonstrate 5 x sit to stand in <12 sec for reduced risk of falls    Baseline  21 sec    Time  12    Period  Weeks    Status  New    Target Date  01/21/20      PT LONG TERM GOAL #3   Title   Pt will demonstrate Berg balance of >54/56    Baseline  49/56    Time  12    Period  Weeks    Status  New    Target Date  01/21/20      PT LONG TERM GOAL #4   Title  Pt will be ind with HEP to maintain strength and improved balance for reduced risk of falls            Plan - 11/05/19 0743    Clinical Impression Statement  Pt is more aware of foot position with walking and is making effort to strike heel and pick up foot when approaching obstacles.  5x sit to stand is improved to 11.86 seconds indicating overall improved balance.  Pt performed all exercises from HEP with good form.  Pt requires close supervision for safety with standing balance exercises.  Pt will continue to benefit from skilled PT to improve balance, gait and safety at home and in the community.    PT Frequency  2x / week    PT Duration  12 weeks    PT Treatment/Interventions  ADLs/Self Care Home Management;Aquatic Therapy;Biofeedback;Moist Heat;Electrical Stimulation;Cryotherapy;Gait Scientist, forensic;Therapeutic activities;Therapeutic exercise;Balance training;Neuromuscular re-education;Patient/family education;Manual techniques;Taping;Dry needling;Passive range of motion    PT Next Visit Plan  standing and dynamic balance activities; bilat hip and knee strength; continue to progress HEP; endurance with walking    PT Home Exercise Plan  Access Code: [email or web address]    Recommended Other Services  initial certification is signed    Consulted and Agree with Plan of Care  Patient       Patient will benefit from skilled therapeutic intervention in order to improve the following deficits and impairments:  Difficulty walking, Decreased balance, Decreased strength  Visit Diagnosis: Muscle weakness (generalized)  Repeated falls     Problem List Patient Active Problem List   Diagnosis Date Noted  .  Fracture of superior pubic ramus ([place]) 11/28/2018  . Stricture and stenosis of esophagus 05/16/2012  . Hiatal hernia  05/16/2012  . Dysphagia, unspecified(787.20) 05/15/2012  . Cancer ([place])   . Depression   . Bipolar 1 disorder ([place])   . Anemia   . ANEMIA, IRON DEFICIENCY 09/14/2010  . GERD 09/14/2010  . Personal history of colonic polyps 09/14/2010     Sigurd Sos, PT 11/05/19 8:01 AM  [facility] [address], [place], [place], [address] Phone: [phone]   Fax:  [phone]  Name: Megan Whitney MRN: [id number] Date of Birth: [birth year]

## 2019-11-07 ENCOUNTER — Other Ambulatory Visit: Payer: Self-pay

## 2019-11-07 ENCOUNTER — Ambulatory Visit: Payer: BC Managed Care – PPO

## 2019-11-07 DIAGNOSIS — M6281 Muscle weakness (generalized): Secondary | ICD-10-CM | POA: Diagnosis not present

## 2019-11-07 DIAGNOSIS — R296 Repeated falls: Secondary | ICD-10-CM

## 2019-11-07 NOTE — Therapy (Signed)
[facility] [address], [place], [place], [address] Phone: [phone]   Fax:  [phone]  Physical Therapy Treatment  Patient Details  Name: Megan Whitney MRN: [id number] Date of Birth: 05/29/1956 Referring Provider (PT): Brunetta Jeans, [place]   Encounter Date: 11/07/2019  PT End of Session - 11/07/19 0758    Visit Number  4    Date for PT Re-Evaluation  01/21/20    Authorization Type  BCBS    PT Start Time  0720    PT Stop Time  0759    PT Time Calculation (min)  39 min    Activity Tolerance  Patient tolerated treatment well    Behavior During Therapy  [facility] for tasks assessed/performed       Past Medical History:  Diagnosis Date  . Allergy    Seasonal  . Anemia    History of GI blood loss  . Anxiety   . Arthritis   . Bipolar 1 disorder ([place])   . Colon polyps   . Depression   . Epistaxis    Around 2011 or 2012, required cauterization.   . Esophageal stricture   . GERD (gastroesophageal reflux disease)   . Headache(784.0)   . Hyperlipidemia   . Interstitial cystitis   . Lung cancer ([place]) 2002  . Obesity   . Sleep apnea    Doesn't use a CPAP    Past Surgical History:  Procedure Laterality Date  . BALLOON DILATION  05/16/2012   Procedure: BALLOON DILATION;  Surgeon: Inda Castle, MD;  Location: [place];  Service: Endoscopy;  Laterality: N/A;  . COLONOSCOPY    . ENTEROSCOPY  05/16/2012   Procedure: ENTEROSCOPY;  Surgeon: Inda Castle, MD;  Location: [place];  Service: Endoscopy;  Laterality: N/A;  . JOINT REPLACEMENT    . right shoulder durgery 25 yrs ago    . TOTAL HIP ARTHROPLASTY  2006, 2008   bilateral  . TUBAL LIGATION    . WEDGE RESECTION  2002   lung cancer    There were no vitals filed for this visit.  Subjective Assessment - 11/07/19 0726    Subjective  I haven't done much because I have had headaches in the evenings due to my concussion.    Currently in Pain?   No/denies                       [facility] Adult PT Treatment/Exercise - 11/07/19 0001      Therapeutic Activites    Other Therapeutic Activities  sit to stand, steps, curbs, walking, standing longer periods of time      Knee/Hip Exercises: Stretches   Active Hamstring Stretch  Right;Left;2 reps;20 seconds    Active Hamstring Stretch Limitations  seated       Knee/Hip Exercises: Aerobic   Nustep  L3 x 6 min while discussing HEP       Knee/Hip Exercises: Standing   Heel Raises  Both;2 sets;10 reps    Heel Raises Limitations  alternating step taps on 6" step    Hip Abduction  Stengthening;Both;2 sets;10 reps    Rocker Board  3 minutes    Rebounder  weight shifting 3 ways x 1 minute each    Other Standing Knee Exercises  tandem stance: 2x20 seconds bil.       Knee/Hip Exercises: Seated   Sit to Sand  2 sets;10 reps;without UE support   holding 8# weight  PT Short Term Goals - 11/05/19 0726      PT SHORT TERM GOAL #3   Title  Pt will not stumble due to not picking up her feet more than 1x/week    Baseline  2-3 reported    Status  On-going        PT Long Term Goals - 10/29/19 1215      PT LONG TERM GOAL #1   Title  Pt will be able to demonstrate dynamic gait with head turns and no change in gait speed    Baseline  slows down with head turns    Time  12    Period  Weeks    Status  New    Target Date  01/21/20      PT LONG TERM GOAL #2   Title  Pt will demonstrate 5 x sit to stand in <12 sec for reduced risk of falls    Baseline  21 sec    Time  12    Period  Weeks    Status  New    Target Date  01/21/20      PT LONG TERM GOAL #3   Title  Pt will demonstrate Berg balance of >54/56    Baseline  49/56    Time  12    Period  Weeks    Status  New    Target Date  01/21/20      PT LONG TERM GOAL #4   Title  Pt will be ind with HEP to maintain strength and improved balance for reduced risk of falls            Plan - 11/07/19  0747    Clinical Impression Statement  Pt reports increased ease with sit to stand throughout her day.  Pt has not been able to do her exercises the past 2 days due to headaches and nausea associated with concussion.  Pt required close supervision and SBA for safety with balance exercises today.  Pt demonstrates improved heel strike with gait on level surfaces and is more consistent with this.  Pt will continue to benefit from skilled PT to address balance, strength and endurance.    PT Frequency  2x / week    PT Duration  12 weeks    PT Treatment/Interventions  ADLs/Self Care Home Management;Aquatic Therapy;Biofeedback;Moist Heat;Electrical Stimulation;Cryotherapy;Gait Scientist, forensic;Therapeutic activities;Therapeutic exercise;Balance training;Neuromuscular re-education;Patient/family education;Manual techniques;Taping;Dry needling;Passive range of motion    PT Next Visit Plan  standing and dynamic balance activities; bilat hip and knee strength; endurance with walking    PT Home Exercise Plan  Access Code: [email or web address]    Consulted and Agree with Plan of Care  Patient       Patient will benefit from skilled therapeutic intervention in order to improve the following deficits and impairments:  Difficulty walking, Decreased balance, Decreased strength  Visit Diagnosis: Repeated falls  Muscle weakness (generalized)     Problem List Patient Active Problem List   Diagnosis Date Noted  . Fracture of superior pubic ramus ([place]) 11/28/2018  . Stricture and stenosis of esophagus 05/16/2012  . Hiatal hernia 05/16/2012  . Dysphagia, unspecified(787.20) 05/15/2012  . Cancer ([place])   . Depression   . Bipolar 1 disorder ([place])   . Anemia   . ANEMIA, IRON DEFICIENCY 09/14/2010  . GERD 09/14/2010  . Personal history of colonic polyps 09/14/2010     Sigurd Sos, PT 11/07/19 8:00 AM  [facility] [address]  [place],  [place], [address] Phone: [phone]   Fax:  [phone]  Name: Megan Whitney MRN: [id number] Date of Birth: [birth year]

## 2019-11-12 ENCOUNTER — Other Ambulatory Visit: Payer: Self-pay

## 2019-11-12 ENCOUNTER — Ambulatory Visit: Payer: BC Managed Care – PPO

## 2019-11-12 DIAGNOSIS — R296 Repeated falls: Secondary | ICD-10-CM

## 2019-11-12 DIAGNOSIS — M6281 Muscle weakness (generalized): Secondary | ICD-10-CM

## 2019-11-12 NOTE — Therapy (Signed)
[facility] [address], [place], [place], [address] Phone: [phone]   Fax:  [phone]  Physical Therapy Treatment  Patient Details  Name: Megan Whitney MRN: [id number] Date of Birth: [birth year] Referring Provider (PT): Brunetta Jeans, [place]   Encounter Date: 11/12/2019  PT End of Session - 11/12/19 0804    Visit Number  5    Date for PT Re-Evaluation  01/21/20    Authorization Type  BCBS    PT Start Time  0728    PT Stop Time  0804    PT Time Calculation (min)  36 min    Activity Tolerance  Patient tolerated treatment Whitney    Behavior During Therapy  [facility] for tasks assessed/performed       Past Medical History:  Diagnosis Date  . Allergy    Seasonal  . Anemia    History of GI blood loss  . Anxiety   . Arthritis   . Bipolar 1 disorder ([place])   . Colon polyps   . Depression   . Epistaxis    Around 2011 or 2012, required cauterization.   . Esophageal stricture   . GERD (gastroesophageal reflux disease)   . Headache(784.0)   . Hyperlipidemia   . Interstitial cystitis   . Lung cancer ([place]) 2002  . Obesity   . Sleep apnea    Doesn't use a CPAP    Past Surgical History:  Procedure Laterality Date  . BALLOON DILATION  05/16/2012   Procedure: BALLOON DILATION;  Surgeon: Inda Castle, MD;  Location: [place];  Service: Endoscopy;  Laterality: N/A;  . COLONOSCOPY    . ENTEROSCOPY  05/16/2012   Procedure: ENTEROSCOPY;  Surgeon: Inda Castle, MD;  Location: [place];  Service: Endoscopy;  Laterality: N/A;  . JOINT REPLACEMENT    . right shoulder durgery 25 yrs ago    . TOTAL HIP ARTHROPLASTY  2006, 2008   bilateral  . TUBAL LIGATION    . WEDGE RESECTION  2002   lung cancer    There were no vitals filed for this visit.  Subjective Assessment - 11/12/19 0735    Subjective  I have been doing my exercises and my husband thinks that I look more steady on my feet.    Patient Stated  Goals  be able to return to walking daily and walk without shuffling; be able to hike; not be afraid to go out and walk;  work out a program here so I can join a gym after PT    Currently in Pain?  No/denies                       [facility] Adult PT Treatment/Exercise - 11/12/19 0001      Therapeutic Activites    Other Therapeutic Activities  sit to stand, steps, curbs, walking, standing longer periods of time      Knee/Hip Exercises: Stretches   Active Hamstring Stretch  Right;Left;2 reps;20 seconds    Active Hamstring Stretch Limitations  seated       Knee/Hip Exercises: Aerobic   Nustep  L3 x 6 min while discussing HEP       Knee/Hip Exercises: Standing   Heel Raises Limitations  alternating step taps on 6" step 2x20 each    Rocker Board  3 minutes    Rebounder  weight shifting 3 ways x 1 minute each    Walking with Sports Cord  resisted walking  20# forward and reverse x 10    Other Standing Knee Exercises  tandem stance: 2x20 seconds bil.  on black pad      Knee/Hip Exercises: Seated   Sit to Sand  2 sets;10 reps;without UE support   holding 8# weight              PT Short Term Goals - 11/12/19 0737      PT SHORT TERM GOAL #1   Title  Pt will be abel to stand single leg at least 5 sec bilateral due to imrpoved LE strength    Baseline  Lt 9 seconds, Rt 15 seconds    Status  Achieved      PT SHORT TERM GOAL #2   Title  Pt will demonstrate 5 x sit to stand in <16 sec    Baseline  9.31    Status  Achieved      PT SHORT TERM GOAL #3   Title  Pt will not stumble due to not picking up her feet more than 1x/week    Baseline  1x and was able to catch herself    Time  4    Period  Weeks    Status  On-going        PT Long Term Goals - 10/29/19 1215      PT LONG TERM GOAL #1   Title  Pt will be able to demonstrate dynamic gait with head turns and no change in gait speed    Baseline  slows down with head turns    Time  12    Period  Weeks    Status   New    Target Date  01/21/20      PT LONG TERM GOAL #2   Title  Pt will demonstrate 5 x sit to stand in <12 sec for reduced risk of falls    Baseline  21 sec    Time  12    Period  Weeks    Status  New    Target Date  01/21/20      PT LONG TERM GOAL #3   Title  Pt will demonstrate Berg balance of >54/56    Baseline  49/56    Time  12    Period  Weeks    Status  New    Target Date  01/21/20      PT LONG TERM GOAL #4   Title  Pt will be ind with HEP to maintain strength and improved balance for reduced risk of falls            Plan - 11/12/19 0747    Clinical Impression Statement  Pt is making steady progress towards goals.  Pt performed 5x sit to stand in 9.31 seconds indicating improved balance overall.  Pt is able to perform single leg stance 15 seconds on Rt and 9 seconds on the Lt.  Pt requires stand by assistance for balance exercises in the clinic.  Pt reports improved confidence with balance in the community and will continue to benefit from skilled PT to address balance, strength and endurance.    PT Frequency  2x / week    PT Duration  12 weeks    PT Treatment/Interventions  ADLs/Self Care Home Management;Aquatic Therapy;Biofeedback;Moist Heat;Electrical Stimulation;Cryotherapy;Gait Scientist, forensic;Therapeutic activities;Therapeutic exercise;Balance training;Neuromuscular re-education;Patient/family education;Manual techniques;Taping;Dry needling;Passive range of motion    PT Next Visit Plan  balance activities, strength    PT Home Exercise Plan  Access Code: [email or web address]  Consulted and Agree with Plan of Care  Patient       Patient will benefit from skilled therapeutic intervention in order to improve the following deficits and impairments:  Difficulty walking, Decreased balance, Decreased strength  Visit Diagnosis: Repeated falls  Muscle weakness (generalized)     Problem List Patient Active Problem List   Diagnosis Date Noted  . Fracture of  superior pubic ramus ([place]) 11/28/2018  . Stricture and stenosis of esophagus 05/16/2012  . Hiatal hernia 05/16/2012  . Dysphagia, unspecified(787.20) 05/15/2012  . Cancer ([place])   . Depression   . Bipolar 1 disorder ([place])   . Anemia   . ANEMIA, IRON DEFICIENCY 09/14/2010  . GERD 09/14/2010  . Personal history of colonic polyps 09/14/2010     Sigurd Sos, PT 11/12/19 8:07 AM  [facility] [address] [place], [place], [address] Phone: [phone]   Fax:  [phone]  Name: Megan Whitney MRN: [id number] Date of Birth: 1955/12/24

## 2019-11-13 ENCOUNTER — Other Ambulatory Visit: Payer: Self-pay

## 2019-11-13 ENCOUNTER — Ambulatory Visit (INDEPENDENT_AMBULATORY_CARE_PROVIDER_SITE_OTHER): Payer: BC Managed Care – PPO | Admitting: Psychiatry

## 2019-11-13 DIAGNOSIS — F319 Bipolar disorder, unspecified: Secondary | ICD-10-CM | POA: Diagnosis not present

## 2019-11-13 NOTE — Progress Notes (Signed)
[facility] Counselor/Therapist Progress Note  Patient ID: Megan Whitney, MRN: [id number],    Date: 11/13/2019  Time Spent: 60 minutes   9:00am to 10:00am  Treatment Type: Individual Therapy  Reported Symptoms: anxiety, depression, impatience, "very angry at husband as he is back drinking again"  Mental Status Exam:  Appearance:   Neat     Behavior:  Appropriate, Sharing and Motivated in some things especially self-care  Motor:  Normal  Speech/Language:   Normal Rate  Affect:  anxious, depressed, frustrated, angry  Mood:  anxious, depressed and irritable  Thought process:  goal directed  Thought content:    WNL  Sensory/Perceptual disturbances:    WNL  Orientation:  oriented to person, place, time/date, situation, day of week, month of year and year  Attention:  Fair  Concentration:  Fair  Memory:  forgetfulness  Fund of knowledge:   Good  Insight:    Good  Judgment:   Good  Impulse Control:  Good   Risk Assessment: Danger to Self:  No Self-injurious Behavior: No Danger to Others: No Duty to Warn:no Physical Aggression / Violence:No  Access to Firearms a concern: No  Gang Involvement:No   Subjective:  Patient today report "super rough time" after a recent fall for which she received and is receiving medical care and some PT for that. (sustaing a concussion).  Anxiety, depression, angry with alcoholic husband.  Interventions: Cognitive Behavioral Therapy and Solution-Oriented/Positive Psychology  Diagnosis:   ICD-10-CM   1. Bipolar I disorder with rapid cycling ([place])  F31.9      Plan: Patient not signing tx plan on computer screen due to [place].  Treatment Goals: Goals remain on plan as patient works on strategies to meet her goals. Progress is noted each visit in "Progress" section of goal plan.  Long Term Goal: Reduce overall level, frequency, and intensity of the anxiety so that daily functioning is not impaired.  Short Term Goal: 1.Increase  understanding of the beliefs and messages that produce the worry and anxiety.  Strategies: 1.Help client develop reality-based positive cognitive messages. 2. Develop a "coping card" or other reminder which coping strategies are recorded for patient's later use .  PROGRESS: Patient in today reporting anxiety, depression, anger, irritability, frustration with husband' continued drinking. Had a significant fall since her last appt,, ended up with concussion. Since her fall, sh is still having some headaches, occasional nausea, some cognitive issues, and occasional light dizziness. Is receiving medical care since the fall. Had been dreading her annual eval at work and she had it recently and it went well.  More motivated in some of her self-care including requesting PT visits, staying in contact with friends, gathers with her knitting group,  using breathiing exercises, and working on there thought replacement with anxious/negative thoughts. Continued efforts to stay in the present versus the past or future which tended to support anxiety. States she is doing better on not assuming the worst in situations.  Has remained on the [employer] app program that encourages healthy eating and other healthy lifestyle patterns, along with mindfulness, but does report that she has not been quite as motivated since her last fall. Her PT therapist suggested she try walking more and to use a walking stick. Reports that she is having some success in recently recognizing more triggers to her anger with alcoholic husband and also triggers with some of her automatic thoughts about herself that lead to negative self-talk. Is able at times to identify those  triggers and replace them with more positive, reality-based, and empowering thoughts that do not support anxiety/depression/negativity.  Goal review and progress noted with patient.    Next appt within 2 weeks.   Shanon Ace, LCSW

## 2019-11-14 ENCOUNTER — Ambulatory Visit: Payer: BC Managed Care – PPO | Attending: Internal Medicine

## 2019-11-14 DIAGNOSIS — Z23 Encounter for immunization: Secondary | ICD-10-CM

## 2019-11-14 NOTE — Progress Notes (Signed)
   Covid-19 Vaccination Clinic  Name:  Megan Whitney    MRN: [id number] DOB: 03-Feb-1956  11/14/2019  [name] was observed post Covid-19 immunization for 15 minutes without incident. She was provided with Vaccine Information Sheet and instruction to access the V-Safe system.   [name] was instructed to call 911 with any severe reactions post vaccine: Marland Kitchen Difficulty breathing  . Swelling of face and throat  . A fast heartbeat  . A bad rash all over body  . Dizziness and weakness   Immunizations Administered    Name Date Dose VIS Date Route   Moderna COVID-19 Vaccine 11/14/2019  8:28 AM 0.5 mL 08/06/2019 Intramuscular   Manufacturer: Moderna   Lot: [id number]   [place]: [id number]

## 2019-11-18 ENCOUNTER — Other Ambulatory Visit: Payer: Self-pay | Admitting: Nurse Practitioner

## 2019-11-18 DIAGNOSIS — N952 Postmenopausal atrophic vaginitis: Secondary | ICD-10-CM | POA: Diagnosis not present

## 2019-11-18 DIAGNOSIS — Z13820 Encounter for screening for osteoporosis: Secondary | ICD-10-CM

## 2019-11-19 ENCOUNTER — Other Ambulatory Visit: Payer: Self-pay

## 2019-11-19 ENCOUNTER — Ambulatory Visit: Payer: BC Managed Care – PPO

## 2019-11-19 ENCOUNTER — Encounter: Payer: Self-pay | Admitting: Physician Assistant

## 2019-11-19 DIAGNOSIS — M6281 Muscle weakness (generalized): Secondary | ICD-10-CM | POA: Diagnosis not present

## 2019-11-19 DIAGNOSIS — R296 Repeated falls: Secondary | ICD-10-CM

## 2019-11-19 NOTE — Therapy (Signed)
[facility] [address] [place], [place], [address] Phone: [phone]   Fax:  [phone]  Physical Therapy Treatment  Patient Details  Name: ZO LOUDON MRN: [id number] Date of Birth: 1956-06-18 Referring Provider (PT): Brunetta Jeans, [place]   Encounter Date: 11/19/2019  PT End of Session - 11/19/19 0814    Visit Number  6    Date for PT Re-Evaluation  01/21/20    Authorization Type  BCBS    PT Start Time  0730    PT Stop Time  0804    PT Time Calculation (min)  34 min    Activity Tolerance  Patient tolerated treatment well    Behavior During Therapy  [facility] for tasks assessed/performed       Past Medical History:  Diagnosis Date  . Allergy    Seasonal  . Anemia    History of GI blood loss  . Anxiety   . Arthritis   . Bipolar 1 disorder ([place])   . Colon polyps   . Depression   . Epistaxis    Around 2011 or 2012, required cauterization.   . Esophageal stricture   . GERD (gastroesophageal reflux disease)   . Headache(784.0)   . Hyperlipidemia   . Interstitial cystitis   . Lung cancer ([place]) 2002  . Obesity   . Sleep apnea    Doesn't use a CPAP    Past Surgical History:  Procedure Laterality Date  . BALLOON DILATION  05/16/2012   Procedure: BALLOON DILATION;  Surgeon: Inda Castle, MD;  Location: [place];  Service: Endoscopy;  Laterality: N/A;  . COLONOSCOPY    . ENTEROSCOPY  05/16/2012   Procedure: ENTEROSCOPY;  Surgeon: Inda Castle, MD;  Location: [place];  Service: Endoscopy;  Laterality: N/A;  . JOINT REPLACEMENT    . right shoulder durgery 25 yrs ago    . TOTAL HIP ARTHROPLASTY  2006, 2008   bilateral  . TUBAL LIGATION    . WEDGE RESECTION  2002   lung cancer    There were no vitals filed for this visit.  Subjective Assessment - 11/19/19 0732    Subjective  I have done well with my exercises.  I walked 1.5 miles on hilly ground this weekend.  I used my walking  stick and had a few moments where i felt wobbly.    Pertinent History  bilater THA 2006 and 2007; polypharmacy (Pt states this may be part of the reason for balance issues);  works at [employer]    Patient Stated Goals  be able to return to walking daily and walk without shuffling; be able to hike; not be afraid to go out and walk;  work out a program here so I can join a gym after PT    Currently in Pain?  No/denies                       [facility] Adult PT Treatment/Exercise - 11/19/19 0001      Knee/Hip Exercises: Aerobic   Nustep  L3 x 6 min while discussing HEP       Knee/Hip Exercises: Standing   Heel Raises Limitations  alternating step taps on 6" step 2x20 each    Hip Abduction  Stengthening;Both;2 sets;10 reps    Abduction Limitations  standing on balance pad    Hip Extension  Stengthening;2 sets;10 reps    Extension Limitations  standing on balance pad  Rocker Board  3 minutes    Rebounder  weight shifting 3 ways x 1 minute each    Walking with Sports Cord  resisted walking 20# forward and reverse x 10    Other Standing Knee Exercises  tandem stance: 2x20 seconds bil.  on black pad             PT Education - 11/19/19 0801    Education Details  Access Code: [email or web address]    Person(s) Educated  Patient    Methods  Explanation;Demonstration;Handout    Comprehension  Verbalized understanding;Returned demonstration       PT Short Term Goals - 11/19/19 0734      PT SHORT TERM GOAL #1   Title  Pt will be abel to stand single leg at least 5 sec bilateral due to imrpoved LE strength    Baseline  Lt 9 seconds, Rt 15 seconds    Status  Achieved      PT SHORT TERM GOAL #2   Title  Pt will demonstrate 5 x sit to stand in <16 sec    Baseline  9.31    Status  Achieved      PT SHORT TERM GOAL #3   Title  Pt will not stumble due to not picking up her feet more than 1x/week    Baseline  a few times- always Lt foot    Time  4    Period  Weeks    Status   On-going        PT Long Term Goals - 10/29/19 1215      PT LONG TERM GOAL #1   Title  Pt will be able to demonstrate dynamic gait with head turns and no change in gait speed    Baseline  slows down with head turns    Time  12    Period  Weeks    Status  New    Target Date  01/21/20      PT LONG TERM GOAL #2   Title  Pt will demonstrate 5 x sit to stand in <12 sec for reduced risk of falls    Baseline  21 sec    Time  12    Period  Weeks    Status  New    Target Date  01/21/20      PT LONG TERM GOAL #3   Title  Pt will demonstrate Berg balance of >54/56    Baseline  49/56    Time  12    Period  Weeks    Status  New    Target Date  01/21/20      PT LONG TERM GOAL #4   Title  Pt will be ind with HEP to maintain strength and improved balance for reduced risk of falls            Plan - 11/19/19 0745    Clinical Impression Statement  Pt continues to demonstrate improved balance with clinic activity with improved ability to balance in tandem on black pad x 20 seconds and perform hip abduction and extension with added challenge of balance pad.  Pt was able to walk for 1.5 miles on unlevel and hilly terrain over the weekend with assistance of her walking sticks.  Pt requires close supervision and sometimes CGA for safety with exercise in the clinic today.  Pt will continue to benefit from skilled PT to address balance and LE strength to improve safety in the community.    PT  Frequency  2x / week    PT Duration  12 weeks    PT Treatment/Interventions  ADLs/Self Care Home Management;Aquatic Therapy;Biofeedback;Moist Heat;Electrical Stimulation;Cryotherapy;Gait Scientist, forensic;Therapeutic activities;Therapeutic exercise;Balance training;Neuromuscular re-education;Patient/family education;Manual techniques;Taping;Dry needling;Passive range of motion    PT Next Visit Plan  balance activities, strength    PT Home Exercise Plan  Access Code: [email or web address]    Consulted and Agree  with Plan of Care  Patient       Patient will benefit from skilled therapeutic intervention in order to improve the following deficits and impairments:  Difficulty walking, Decreased balance, Decreased strength  Visit Diagnosis: Muscle weakness (generalized)  Repeated falls     Problem List Patient Active Problem List   Diagnosis Date Noted  . Fracture of superior pubic ramus ([place]) 11/28/2018  . Stricture and stenosis of esophagus 05/16/2012  . Hiatal hernia 05/16/2012  . Dysphagia, unspecified(787.20) 05/15/2012  . Cancer ([place])   . Depression   . Bipolar 1 disorder ([place])   . Anemia   . ANEMIA, IRON DEFICIENCY 09/14/2010  . GERD 09/14/2010  . Personal history of colonic polyps 09/14/2010     Sigurd Sos, PT 11/19/19 8:16 AM  [facility] [address], [place], [place], [address] Phone: [phone]   Fax:  [phone]  Name: SARIT SPARANO MRN: [id number] Date of Birth: 03-Nov-1955

## 2019-11-19 NOTE — Patient Instructions (Signed)
Access Code: [email or web address] URL: [email or web address] Date: 11/19/2019 Prepared by: Claiborne Billings  Exercises  Standing Hip Abduction with Counter Support - 1 x daily - 7 x weekly - 2 sets - 10 reps Standing Hip Extension - 1 x daily - 7 x weekly - 2 sets - 10 reps

## 2019-11-21 ENCOUNTER — Ambulatory Visit: Payer: BC Managed Care – PPO

## 2019-11-21 ENCOUNTER — Other Ambulatory Visit: Payer: Self-pay

## 2019-11-21 DIAGNOSIS — R296 Repeated falls: Secondary | ICD-10-CM | POA: Diagnosis not present

## 2019-11-21 DIAGNOSIS — M6281 Muscle weakness (generalized): Secondary | ICD-10-CM

## 2019-11-21 NOTE — Therapy (Signed)
[facility] [address] [place], [place], [address] Phone: [phone]   Fax:  [phone]  Physical Therapy Treatment  Patient Details  Name: Megan Whitney MRN: [id number] Date of Birth: [birth year] Referring Provider (PT): Brunetta Jeans, [place]   Encounter Date: 11/21/2019  PT End of Session - 11/21/19 0801    Visit Number  7    Date for PT Re-Evaluation  01/21/20    Authorization Type  BCBS    PT Start Time  0728    PT Stop Time  0801    PT Time Calculation (min)  33 min    Activity Tolerance  Patient tolerated treatment well    Behavior During Therapy  [facility] for tasks assessed/performed       Past Medical History:  Diagnosis Date  . Allergy    Seasonal  . Anemia    History of GI blood loss  . Anxiety   . Arthritis   . Bipolar 1 disorder ([place])   . Colon polyps   . Depression   . Epistaxis    Around 2011 or 2012, required cauterization.   . Esophageal stricture   . GERD (gastroesophageal reflux disease)   . Headache(784.0)   . Hyperlipidemia   . Interstitial cystitis   . Lung cancer ([place]) 2002  . Obesity   . Sleep apnea    Doesn't use a CPAP    Past Surgical History:  Procedure Laterality Date  . BALLOON DILATION  05/16/2012   Procedure: BALLOON DILATION;  Surgeon: Inda Castle, MD;  Location: [place];  Service: Endoscopy;  Laterality: N/A;  . COLONOSCOPY    . ENTEROSCOPY  05/16/2012   Procedure: ENTEROSCOPY;  Surgeon: Inda Castle, MD;  Location: [place];  Service: Endoscopy;  Laterality: N/A;  . JOINT REPLACEMENT    . right shoulder durgery 25 yrs ago    . TOTAL HIP ARTHROPLASTY  2006, 2008   bilateral  . TUBAL LIGATION    . WEDGE RESECTION  2002   lung cancer    There were no vitals filed for this visit.  Subjective Assessment - 11/21/19 0802    Subjective  My husband says that I look much better walking.  I have been picking up my feet better.    Currently in  Pain?  No/denies                       [facility] Adult PT Treatment/Exercise - 11/21/19 0001      Knee/Hip Exercises: Aerobic   Nustep  L3 x 6 min while discussing HEP       Knee/Hip Exercises: Standing   Heel Raises Limitations  alternating step taps on 6" step 2x20 each    Rebounder  weight shifting 3 ways x 1 minute each    Walking with Sports Cord  resisted walking 20# forward and reverse x 10, sidestepping 15# Rt and Lt    Other Standing Knee Exercises  tandem stance: 2x20 seconds bil. on mini tramp               PT Short Term Goals - 11/19/19 0734      PT SHORT TERM GOAL #1   Title  Pt will be abel to stand single leg at least 5 sec bilateral due to imrpoved LE strength    Baseline  Lt 9 seconds, Rt 15 seconds    Status  Achieved      PT SHORT  TERM GOAL #2   Title  Pt will demonstrate 5 x sit to stand in <16 sec    Baseline  9.31    Status  Achieved      PT SHORT TERM GOAL #3   Title  Pt will not stumble due to not picking up her feet more than 1x/week    Baseline  a few times- always Lt foot    Time  4    Period  Weeks    Status  On-going        PT Long Term Goals - 10/29/19 1215      PT LONG TERM GOAL #1   Title  Pt will be able to demonstrate dynamic gait with head turns and no change in gait speed    Baseline  slows down with head turns    Time  12    Period  Weeks    Status  New    Target Date  01/21/20      PT LONG TERM GOAL #2   Title  Pt will demonstrate 5 x sit to stand in <12 sec for reduced risk of falls    Baseline  21 sec    Time  12    Period  Weeks    Status  New    Target Date  01/21/20      PT LONG TERM GOAL #3   Title  Pt will demonstrate Berg balance of >54/56    Baseline  49/56    Time  12    Period  Weeks    Status  New    Target Date  01/21/20      PT LONG TERM GOAL #4   Title  Pt will be ind with HEP to maintain strength and improved balance for reduced risk of falls            Plan - 11/21/19  0748    Clinical Impression Statement  Pt continues to demonstrate improved balance with clinic activity with improved ability to balance in tandem on black pad x 20 seconds and perform hip abduction and extension with added challenge of balance pad this week.  Pt reports improved ability to perform heel strike on level surfaces and reports that this is more challenging when fatigued.  Pt uses a walking stick in the community for long walks.   Pt requires close supervision and sometimes CGA for safety with exercise in the clinic today.  Pt will continue to benefit from skilled PT to address balance and LE strength to improve safety in the community.    PT Treatment/Interventions  ADLs/Self Care Home Management;Aquatic Therapy;Biofeedback;Moist Heat;Electrical Stimulation;Cryotherapy;Gait Scientist, forensic;Therapeutic activities;Therapeutic exercise;Balance training;Neuromuscular re-education;Patient/family education;Manual techniques;Taping;Dry needling;Passive range of motion    PT Next Visit Plan  balance activities, strength    PT Home Exercise Plan  Access Code: [email or web address]    Consulted and Agree with Plan of Care  Patient       Patient will benefit from skilled therapeutic intervention in order to improve the following deficits and impairments:  Difficulty walking, Decreased balance, Decreased strength  Visit Diagnosis: Muscle weakness (generalized)  Repeated falls     Problem List Patient Active Problem List   Diagnosis Date Noted  . Fracture of superior pubic ramus ([place]) 11/28/2018  . Stricture and stenosis of esophagus 05/16/2012  . Hiatal hernia 05/16/2012  . Dysphagia, unspecified(787.20) 05/15/2012  . Cancer ([place])   . Depression   . Bipolar 1 disorder ([place])   . Anemia   .  ANEMIA, IRON DEFICIENCY 09/14/2010  . GERD 09/14/2010  . Personal history of colonic polyps 09/14/2010    Sigurd Sos, PT 11/21/19 8:02 AM  [facility] [address], [place], [place], [address] Phone: [phone]   Fax:  [phone]  Name: Megan Whitney MRN: [id number] Date of Birth: 02/17/1956

## 2019-11-26 ENCOUNTER — Ambulatory Visit: Payer: BC Managed Care – PPO

## 2019-11-26 ENCOUNTER — Other Ambulatory Visit: Payer: Self-pay

## 2019-11-26 DIAGNOSIS — M6281 Muscle weakness (generalized): Secondary | ICD-10-CM

## 2019-11-26 DIAGNOSIS — R296 Repeated falls: Secondary | ICD-10-CM

## 2019-11-26 NOTE — Therapy (Signed)
[facility] [address] [place], [place], [address] Phone: [phone]   Fax:  [phone]  Physical Therapy Treatment  Patient Details  Name: Megan Whitney MRN: [id number] Date of Birth: 06-09-1956 Referring Provider (PT): Brunetta Jeans, [place]   Encounter Date: 11/26/2019  PT End of Session - 11/26/19 0801    Visit Number  8    Date for PT Re-Evaluation  01/21/20    Authorization Type  BCBS    PT Start Time  0727    PT Stop Time  0802    PT Time Calculation (min)  35 min    Activity Tolerance  Patient tolerated treatment well    Behavior During Therapy  [facility] for tasks assessed/performed       Past Medical History:  Diagnosis Date  . Allergy    Seasonal  . Anemia    History of GI blood loss  . Anxiety   . Arthritis   . Bipolar 1 disorder ([place])   . Colon polyps   . Depression   . Epistaxis    Around 2011 or 2012, required cauterization.   . Esophageal stricture   . GERD (gastroesophageal reflux disease)   . Headache(784.0)   . Hyperlipidemia   . Interstitial cystitis   . Lung cancer ([place]) 2002  . Obesity   . Sleep apnea    Doesn't use a CPAP    Past Surgical History:  Procedure Laterality Date  . BALLOON DILATION  05/16/2012   Procedure: BALLOON DILATION;  Surgeon: Inda Castle, MD;  Location: [place];  Service: Endoscopy;  Laterality: N/A;  . COLONOSCOPY    . ENTEROSCOPY  05/16/2012   Procedure: ENTEROSCOPY;  Surgeon: Inda Castle, MD;  Location: [place];  Service: Endoscopy;  Laterality: N/A;  . JOINT REPLACEMENT    . right shoulder durgery 25 yrs ago    . TOTAL HIP ARTHROPLASTY  2006, 2008   bilateral  . TUBAL LIGATION    . WEDGE RESECTION  2002   lung cancer    There were no vitals filed for this visit.  Subjective Assessment - 11/26/19 0732    Subjective  I have been working on my balance.  I have been line walking and doing heel/toe raises under my desk.    Currently in Pain?  No/denies                       [facility] Adult PT Treatment/Exercise - 11/26/19 0001      Knee/Hip Exercises: Aerobic   Nustep  L3 x 6 min with goal assessment      Knee/Hip Exercises: Standing   Heel Raises Limitations  alternating step taps on 6" step 2x20 each    Rebounder  weight shifting 3 ways x 1 minute each    Walking with Sports Cord  resisted walking 20# forward and reverse x 10, sidestepping 15# Rt and Lt    Other Standing Knee Exercises  tandem stance: 2x20 seconds bil. on mini tramp    Other Standing Knee Exercises  sidestepping with green band above knees 2x10 feet               PT Short Term Goals - 11/19/19 0734      PT SHORT TERM GOAL #1   Title  Pt will be abel to stand single leg at least 5 sec bilateral due to imrpoved LE strength    Baseline  Lt 9 seconds,  Rt 15 seconds    Status  Achieved      PT SHORT TERM GOAL #2   Title  Pt will demonstrate 5 x sit to stand in <16 sec    Baseline  9.31    Status  Achieved      PT SHORT TERM GOAL #3   Title  Pt will not stumble due to not picking up her feet more than 1x/week    Baseline  a few times- always Lt foot    Time  4    Period  Weeks    Status  On-going        PT Long Term Goals - 10/29/19 1215      PT LONG TERM GOAL #1   Title  Pt will be able to demonstrate dynamic gait with head turns and no change in gait speed    Baseline  slows down with head turns    Time  12    Period  Weeks    Status  New    Target Date  01/21/20      PT LONG TERM GOAL #2   Title  Pt will demonstrate 5 x sit to stand in <12 sec for reduced risk of falls    Baseline  21 sec    Time  12    Period  Weeks    Status  New    Target Date  01/21/20      PT LONG TERM GOAL #3   Title  Pt will demonstrate Berg balance of >54/56    Baseline  49/56    Time  12    Period  Weeks    Status  New    Target Date  01/21/20      PT LONG TERM GOAL #4   Title  Pt will be ind with HEP to  maintain strength and improved balance for reduced risk of falls            Plan - 11/26/19 0746    Clinical Impression Statement  Pt continues to demonstrate improved balance with clinic activity with improved ability to balance in tandem on the mini tramp x 20 seconds and perform hip abduction and extension with added challenge of balance.  Pt reports improved ability to perform heel strike on level surfaces and reports that this is more challenging when fatigued.  Pt uses a walking stick in the community for long walks.   Pt requires close supervision and sometimes CGA for safety with exercise in the clinic today.  Pt will continue to benefit from skilled PT to address balance and LE strength to improve safety in the community.    PT Frequency  2x / week    PT Duration  12 weeks    PT Treatment/Interventions  ADLs/Self Care Home Management;Aquatic Therapy;Biofeedback;Moist Heat;Electrical Stimulation;Cryotherapy;Gait Scientist, forensic;Therapeutic activities;Therapeutic exercise;Balance training;Neuromuscular re-education;Patient/family education;Manual techniques;Taping;Dry needling;Passive range of motion    PT Next Visit Plan  balance activities, strength    PT Home Exercise Plan  Access Code: [email or web address]    Consulted and Agree with Plan of Care  Patient       Patient will benefit from skilled therapeutic intervention in order to improve the following deficits and impairments:  Difficulty walking, Decreased balance, Decreased strength  Visit Diagnosis: Repeated falls  Muscle weakness (generalized)     Problem List Patient Active Problem List   Diagnosis Date Noted  . Fracture of superior pubic ramus ([place]) 11/28/2018  . Stricture and stenosis of  esophagus 05/16/2012  . Hiatal hernia 05/16/2012  . Dysphagia, unspecified(787.20) 05/15/2012  . Cancer ([place])   . Depression   . Bipolar 1 disorder ([place])   . Anemia   . ANEMIA, IRON DEFICIENCY 09/14/2010  . GERD 09/14/2010  .  Personal history of colonic polyps 09/14/2010     Sigurd Sos, PT 11/26/19 8:03 AM  [facility] [address] [place], [place], [address] Phone: [phone]   Fax:  [phone]  Name: CLOIS MONTAVON MRN: [id number] Date of Birth: Jul 02, 1956

## 2019-11-27 ENCOUNTER — Ambulatory Visit (INDEPENDENT_AMBULATORY_CARE_PROVIDER_SITE_OTHER): Payer: BC Managed Care – PPO | Admitting: Psychiatry

## 2019-11-27 DIAGNOSIS — F319 Bipolar disorder, unspecified: Secondary | ICD-10-CM

## 2019-11-27 NOTE — Progress Notes (Signed)
[facility] Counselor/Therapist Progress Note  Patient ID: Megan Whitney, MRN: [id number],    Date: 11/27/2019  Time Spent: 60 minutes   9:00am to 10:00am  Treatment Type: Individual Therapy  Reported Symptoms: anxiety, "angry episode last week with husband but moving on now"  Mental Status Exam:  Appearance:   Neat     Behavior:  Appropriate and Sharing  Motor:  Normal  Speech/Language:   Normal Rate  Affect:  anxious, depressed  Mood:  anxious and depressed  Thought process:  anxious, depressed  Thought content:    WNL  Sensory/Perceptual disturbances:    WNL  Orientation:  oriented to person, place, time/date, situation, day of week, month of year and year  Attention:  Good/Fair  Concentration:  Good/Fair  Memory:  has "forgetfulness and forgets words sometimes"  Fund of knowledge:   Good  Insight:    Good/Fair  Judgment:   Good  Impulse Control:  Good/Fair   Risk Assessment: Danger to Self:  No Self-injurious Behavior: No Danger to Others: No Duty to Warn:no Physical Aggression / Violence:No  Access to Firearms a concern: No  Gang Involvement:No   Subjective: Patient in today reporting anxiety, depression, with 1 anger episode with husband within past 2 weeks (all verbal).  States that the strategies we worked on last session helped particularly "Staying in [employer]" as she shared how it was successful for her.  Interventions: Cognitive Behavioral Therapy and Ego-Supportive  Diagnosis:   ICD-10-CM   1. Bipolar I disorder with rapid cycling ([place])  F31.9     Plan: Patient not signing tx plan on computer screen due to [place].  Treatment Goals: Goals remain on plan as patient works on strategies to meet her goals. Progress is noted each visit in "Progress" section of goal plan.  Long Term Goal: Reduce overall level, frequency, and intensity of the anxiety so that daily functioning is not impaired.  Short Term Goal: 1.Increase understanding  of the beliefs and messages that produce the worry and anxiety.  Strategies: 1.Help client develop reality-based positive cognitive messages. 2. Develop a "coping card" or other reminder which coping strategies are recorded for patient's later use .  PROGRESS: Patient in today reporting anxiety, depression, anger episode with husband last week  Admits that she stopped with husband who got a beer but she got 2 "top shelf margaritas".  Went home and fell asleep.  Was "mean" the next day, very irritable until it wore off that night.  By the next morning "I was better and then had moved beyond that".  Describes how she used one of the strategies from last session that seemed to help her more--"staying in the present moment".  Also did some good reframing "in the moment" which is usually very difficult for her---"I am trying to pay more attention to triggers and it actually worked this time."  Alcoholic husband continues to consistently consume alcohol. States that she has not fallen again and has healed up from last fall, continues PT, but still forgets some words at times. Is continuing to work hard on the recognition of triggers to be able to respond in healthier ways and discussed this more today. Wanting to "recognize my triggers earlier so as to try to limit my reaction to them.  "I really think my recent anger outburst was my mania." Is remaining on her meds as prescribed and reports feeling better and more grounded now.  Things with husband have been calm since that  outburst. "message in my head calmed down and aren't really bad except when I'm not really engaged with anything.  Still hearing "chirping"  ("like chirping crickets, noises only early to late evening. Report decreased movitation to walk, crochet. Talked about the strategies and supports that are usually more helpful to her and she is working to "get back on track which I feel like I have but I need to maintain it."  To begin her PT again  consistently, be in touch with friends that are healthy for her, involve herself with knitting group, physical exercise as able, stay in the present, being aware of triggers and taking action, and positive self-talk.    Goal review and challenges/progress noted with patient.  Next session within 2 weeks.  Shanon Ace, LCSW

## 2019-11-28 ENCOUNTER — Other Ambulatory Visit: Payer: Self-pay

## 2019-11-28 ENCOUNTER — Ambulatory Visit: Payer: BC Managed Care – PPO

## 2019-11-28 DIAGNOSIS — M6281 Muscle weakness (generalized): Secondary | ICD-10-CM

## 2019-11-28 DIAGNOSIS — R296 Repeated falls: Secondary | ICD-10-CM | POA: Diagnosis not present

## 2019-11-28 NOTE — Therapy (Signed)
[facility] [address] [place], [place], [address] Phone: [phone]   Fax:  [phone]  Physical Therapy Treatment  Patient Details  Name: Megan Whitney MRN: [id number] Date of Birth: 20-Apr-1956 Referring Provider (PT): Brunetta Jeans, [place]   Encounter Date: 11/28/2019  PT End of Session - 11/28/19 0802    Visit Number  9    Date for PT Re-Evaluation  01/21/20    Authorization Type  BCBS    PT Start Time  0728    PT Stop Time  0804    PT Time Calculation (min)  36 min    Activity Tolerance  Patient tolerated treatment well    Behavior During Therapy  [facility] for tasks assessed/performed       Past Medical History:  Diagnosis Date  . Allergy    Seasonal  . Anemia    History of GI blood loss  . Anxiety   . Arthritis   . Bipolar 1 disorder ([facility])   . Colon polyps   . Depression   . Epistaxis    Around 2011 or 2012, required cauterization.   . Esophageal stricture   . GERD (gastroesophageal reflux disease)   . Headache(784.0)   . Hyperlipidemia   . Interstitial cystitis   . Lung cancer ([place]) 2002  . Obesity   . Sleep apnea    Doesn't use a CPAP    Past Surgical History:  Procedure Laterality Date  . BALLOON DILATION  05/16/2012   Procedure: BALLOON DILATION;  Surgeon: Inda Castle, MD;  Location: [place];  Service: Endoscopy;  Laterality: N/A;  . COLONOSCOPY    . ENTEROSCOPY  05/16/2012   Procedure: ENTEROSCOPY;  Surgeon: Inda Castle, MD;  Location: [place];  Service: Endoscopy;  Laterality: N/A;  . JOINT REPLACEMENT    . right shoulder durgery 25 yrs ago    . TOTAL HIP ARTHROPLASTY  2006, 2008   bilateral  . TUBAL LIGATION    . WEDGE RESECTION  2002   lung cancer    There were no vitals filed for this visit.  Subjective Assessment - 11/28/19 0732    Subjective  I have been doing well.  I'm working on heel strike with gait.    Currently in Pain?  No/denies                        [facility] Adult PT Treatment/Exercise - 11/28/19 0001      Knee/Hip Exercises: Aerobic   Nustep  L3 x 6 min with goal assessment      Knee/Hip Exercises: Standing   Heel Raises Limitations  alternating step taps on 6" step 2x20 each standing on balance pad    Rebounder  weight shifting 3 ways x 1 minute each    Walking with Sports Cord  resisted walking 20# forward and reverse x 10, sidestepping 15# Rt and Lt x 5-10    Other Standing Knee Exercises  tandem stance: 2x30 seconds bil. on mini tramp      Knee/Hip Exercises: Seated   Sit to Sand  2 sets;10 reps;without UE support   on balance pad, holding yellow ball               PT Short Term Goals - 11/19/19 0734      PT SHORT TERM GOAL #1   Title  Pt will be abel to stand single leg at least 5 sec bilateral due to  imrpoved LE strength    Baseline  Lt 9 seconds, Rt 15 seconds    Status  Achieved      PT SHORT TERM GOAL #2   Title  Pt will demonstrate 5 x sit to stand in <16 sec    Baseline  9.31    Status  Achieved      PT SHORT TERM GOAL #3   Title  Pt will not stumble due to not picking up her feet more than 1x/week    Baseline  a few times- always Lt foot    Time  4    Period  Weeks    Status  On-going        PT Long Term Goals - 10/29/19 1215      PT LONG TERM GOAL #1   Title  Pt will be able to demonstrate dynamic gait with head turns and no change in gait speed    Baseline  slows down with head turns    Time  12    Period  Weeks    Status  New    Target Date  01/21/20      PT LONG TERM GOAL #2   Title  Pt will demonstrate 5 x sit to stand in <12 sec for reduced risk of falls    Baseline  21 sec    Time  12    Period  Weeks    Status  New    Target Date  01/21/20      PT LONG TERM GOAL #3   Title  Pt will demonstrate Berg balance of >54/56    Baseline  49/56    Time  12    Period  Weeks    Status  New    Target Date  01/21/20      PT LONG TERM GOAL #4    Title  Pt will be ind with HEP to maintain strength and improved balance for reduced risk of falls            Plan - 11/28/19 0744    Clinical Impression Statement  Pt continues to demonstrate improved balance with clinic activity with improved ability to balance in tandem on the mini tramp x 30 seconds and perform sit to stand on balance pad. Pt reports improved ability to perform heel strike on level surfaces and reports that this is more challenging when fatigued.   Pt requires close supervision and sometimes CGA for safety with exercise in the clinic today.  Pt will continue to benefit from skilled PT to address balance and LE strength to improve safety in the community.    PT Frequency  2x / week    PT Duration  12 weeks    PT Treatment/Interventions  ADLs/Self Care Home Management;Aquatic Therapy;Biofeedback;Moist Heat;Electrical Stimulation;Cryotherapy;Gait Scientist, forensic;Therapeutic activities;Therapeutic exercise;Balance training;Neuromuscular re-education;Patient/family education;Manual techniques;Taping;Dry needling;Passive range of motion    PT Next Visit Plan  balance activities, strength    PT Home Exercise Plan  Access Code: [email or web address]    Consulted and Agree with Plan of Care  Patient       Patient will benefit from skilled therapeutic intervention in order to improve the following deficits and impairments:     Visit Diagnosis: Repeated falls  Muscle weakness (generalized)     Problem List Patient Active Problem List   Diagnosis Date Noted  . Fracture of superior pubic ramus ([place]) 11/28/2018  . Stricture and stenosis of esophagus 05/16/2012  . Hiatal hernia 05/16/2012  .  Dysphagia, unspecified(787.20) 05/15/2012  . Cancer ([place])   . Depression   . Bipolar 1 disorder ([place])   . Anemia   . ANEMIA, IRON DEFICIENCY 09/14/2010  . GERD 09/14/2010  . Personal history of colonic polyps 09/14/2010     Sigurd Sos, PT 11/28/19 8:30 AM  [facility] [address] [place], [place], [address] Phone: [phone]   Fax:  [phone]  Name: Megan Whitney MRN: [id number] Date of Birth: 1956/01/08

## 2019-12-02 ENCOUNTER — Telehealth: Payer: Self-pay | Admitting: Psychiatry

## 2019-12-02 NOTE — Telephone Encounter (Signed)
She already has both on file, Focalin 10 mg, last refill 11/04/2019 #30 And Focalin XR 25 mg, last refill 10/31/2019 #30   Patient is okay to fill the XR 25 mg now but too soon for 10 mg.

## 2019-12-02 NOTE — Telephone Encounter (Signed)
Pt does not know what happened but she will be out of her Dexmethylphenidate 25mg . Pharmacy is saying she can't get a refill until Saturday. Please send to [facility] on [address] in [place].

## 2019-12-05 ENCOUNTER — Other Ambulatory Visit: Payer: Self-pay

## 2019-12-05 ENCOUNTER — Ambulatory Visit: Payer: BC Managed Care – PPO | Attending: Physician Assistant

## 2019-12-05 DIAGNOSIS — R296 Repeated falls: Secondary | ICD-10-CM

## 2019-12-05 DIAGNOSIS — M6281 Muscle weakness (generalized): Secondary | ICD-10-CM | POA: Insufficient documentation

## 2019-12-05 NOTE — Therapy (Signed)
[facility] [address] [place], [place], [address] Phone: [phone]   Fax:  [phone]  Physical Therapy Treatment  Patient Details  Name: Megan Whitney MRN: [id number] Date of Birth: 06-02-1956 Referring Provider (PT): Brunetta Jeans, [place]   Encounter Date: 12/05/2019  PT End of Session - 12/05/19 0807    Visit Number  10    Date for PT Re-Evaluation  01/21/20    Authorization Type  BCBS    PT Start Time  0729    PT Stop Time  0802    PT Time Calculation (min)  33 min    Activity Tolerance  Patient tolerated treatment well    Behavior During Therapy  [facility] for tasks assessed/performed       Past Medical History:  Diagnosis Date  . Allergy    Seasonal  . Anemia    History of GI blood loss  . Anxiety   . Arthritis   . Bipolar 1 disorder ([place])   . Colon polyps   . Depression   . Epistaxis    Around 2011 or 2012, required cauterization.   . Esophageal stricture   . GERD (gastroesophageal reflux disease)   . Headache(784.0)   . Hyperlipidemia   . Interstitial cystitis   . Lung cancer ([place]) 2002  . Obesity   . Sleep apnea    Doesn't use a CPAP    Past Surgical History:  Procedure Laterality Date  . BALLOON DILATION  05/16/2012   Procedure: BALLOON DILATION;  Surgeon: Inda Castle, MD;  Location: [place];  Service: Endoscopy;  Laterality: N/A;  . COLONOSCOPY    . ENTEROSCOPY  05/16/2012   Procedure: ENTEROSCOPY;  Surgeon: Inda Castle, MD;  Location: [place];  Service: Endoscopy;  Laterality: N/A;  . JOINT REPLACEMENT    . right shoulder durgery 25 yrs ago    . TOTAL HIP ARTHROPLASTY  2006, 2008   bilateral  . TUBAL LIGATION    . WEDGE RESECTION  2002   lung cancer    There were no vitals filed for this visit.  Subjective Assessment - 12/05/19 0737    Subjective  I have had a couple of stumbles but no loss of balance.    Pertinent History  bilater THA 2006 and 2007;  polypharmacy (Pt states this may be part of the reason for balance issues);  works at [employer]    Currently in Pain?  No/denies         [facility] PT Assessment - 12/05/19 0001      Standardized Balance Assessment   Five times sit to stand comments   9.03 seconds                   [facility] Adult PT Treatment/Exercise - 12/05/19 0001      Knee/Hip Exercises: Aerobic   Nustep  L3 x 6 min with goal assessment      Knee/Hip Exercises: Standing   Heel Raises Limitations  alternating step taps on 6" step 2x20 each standing on balance pad    Walking with Sports Cord  resisted walking: sidestepping 15# Rt and Lt x 5-10    Other Standing Knee Exercises  tandem stance: 2x30 seconds bil. on mini tramp    Other Standing Knee Exercises  standing on balance pad       Knee/Hip Exercises: Seated   Sit to Sand  2 sets;10 reps;without UE support   on balance pad,  holding yellow ball               PT Short Term Goals - 11/19/19 0734      PT SHORT TERM GOAL #1   Title  Pt will be abel to stand single leg at least 5 sec bilateral due to imrpoved LE strength    Baseline  Lt 9 seconds, Rt 15 seconds    Status  Achieved      PT SHORT TERM GOAL #2   Title  Pt will demonstrate 5 x sit to stand in <16 sec    Baseline  9.31    Status  Achieved      PT SHORT TERM GOAL #3   Title  Pt will not stumble due to not picking up her feet more than 1x/week    Baseline  a few times- always Lt foot    Time  4    Period  Weeks    Status  On-going        PT Long Term Goals - 12/05/19 0739      PT LONG TERM GOAL #2   Title  Pt will demonstrate 5 x sit to stand in <12 sec for reduced risk of falls    Baseline  9.03    Status  Achieved            Plan - 12/05/19 0750    Clinical Impression Statement  Pt reports 2 episodes of "stumbling" while walking and was able to correct her balance.   Pt reports improved ability to perform heel strike on level surfaces and reports that  this is more challenging when fatigued.  5x sit to stand was 9.03 seconds indicating improved balance overall.  Pt requires close supervision and sometimes CGA for safety with exercise in the clinic today.  Pt will continue to benefit from skilled PT to address balance and LE strength to improve safety in the community.    PT Frequency  2x / week    PT Duration  12 weeks    PT Treatment/Interventions  ADLs/Self Care Home Management;Aquatic Therapy;Biofeedback;Moist Heat;Electrical Stimulation;Cryotherapy;Gait Scientist, forensic;Therapeutic activities;Therapeutic exercise;Balance training;Neuromuscular re-education;Patient/family education;Manual techniques;Taping;Dry needling;Passive range of motion    PT Next Visit Plan  balance activities, strength    PT Home Exercise Plan  Access Code: [email or web address]    Consulted and Agree with Plan of Care  Patient       Patient will benefit from skilled therapeutic intervention in order to improve the following deficits and impairments:  Difficulty walking, Decreased balance, Decreased strength  Visit Diagnosis: Repeated falls  Muscle weakness (generalized)     Problem List Patient Active Problem List   Diagnosis Date Noted  . Fracture of superior pubic ramus ([place]) 11/28/2018  . Stricture and stenosis of esophagus 05/16/2012  . Hiatal hernia 05/16/2012  . Dysphagia, unspecified(787.20) 05/15/2012  . Cancer ([place])   . Depression   . Bipolar 1 disorder ([place])   . Anemia   . ANEMIA, IRON DEFICIENCY 09/14/2010  . GERD 09/14/2010  . Personal history of colonic polyps 09/14/2010    Sigurd Sos, PT 12/05/19 8:10 AM  [facility] [address] [place], [place], [address] Phone: [phone]   Fax:  [phone]  Name: DONNETTE MACMULLEN MRN: [id number] Date of Birth: 11/17/1955

## 2019-12-06 ENCOUNTER — Other Ambulatory Visit: Payer: BC Managed Care – PPO

## 2019-12-11 ENCOUNTER — Other Ambulatory Visit: Payer: Self-pay

## 2019-12-11 ENCOUNTER — Ambulatory Visit (INDEPENDENT_AMBULATORY_CARE_PROVIDER_SITE_OTHER): Payer: BC Managed Care – PPO | Admitting: Psychiatry

## 2019-12-11 DIAGNOSIS — F319 Bipolar disorder, unspecified: Secondary | ICD-10-CM
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
## 2019-12-13 ENCOUNTER — Other Ambulatory Visit: Payer: Self-pay | Admitting: Psychiatry

## 2019-12-13 ENCOUNTER — Telehealth: Payer: Self-pay | Admitting: Psychiatry

## 2019-12-13 DIAGNOSIS — F319 Bipolar disorder, unspecified: Secondary | ICD-10-CM

## 2019-12-13 NOTE — Telephone Encounter (Signed)
error 

## 2019-12-17 ENCOUNTER — Ambulatory Visit: Payer: BC Managed Care – PPO

## 2019-12-17 ENCOUNTER — Other Ambulatory Visit: Payer: Self-pay

## 2019-12-17 DIAGNOSIS — R296 Repeated falls: Secondary | ICD-10-CM

## 2019-12-17 DIAGNOSIS — M6281 Muscle weakness (generalized): Secondary | ICD-10-CM | POA: Diagnosis not present

## 2019-12-17 NOTE — Therapy (Addendum)
[facility] [address], [place], [place], [address] Phone: [phone]   Fax:  [phone]  Physical Therapy Treatment  Patient Details  Name: Megan Whitney MRN: [id number] Date of Birth: 24-Oct-1955 Referring Provider (PT): Brunetta Jeans, [place]   Encounter Date: 12/17/2019  PT End of Session - 12/17/19 0759    Visit Number  11    Date for PT Re-Evaluation  01/21/20    Authorization Type  BCBS    PT Start Time  0728    PT Stop Time  0802    PT Time Calculation (min)  34 min    Activity Tolerance  Patient tolerated treatment well    Behavior During Therapy  [facility] for tasks assessed/performed       Past Medical History:  Diagnosis Date  . Allergy    Seasonal  . Anemia    History of GI blood loss  . Anxiety   . Arthritis   . Bipolar 1 disorder ([place])   . Colon polyps   . Depression   . Epistaxis    Around 2011 or 2012, required cauterization.   . Esophageal stricture   . GERD (gastroesophageal reflux disease)   . Headache(784.0)   . Hyperlipidemia   . Interstitial cystitis   . Lung cancer ([place]) 2002  . Obesity   . Sleep apnea    Doesn't use a CPAP    Past Surgical History:  Procedure Laterality Date  . BALLOON DILATION  05/16/2012   Procedure: BALLOON DILATION;  Surgeon: Inda Castle, MD;  Location: [place];  Service: Endoscopy;  Laterality: N/A;  . COLONOSCOPY    . ENTEROSCOPY  05/16/2012   Procedure: ENTEROSCOPY;  Surgeon: Inda Castle, MD;  Location: [place];  Service: Endoscopy;  Laterality: N/A;  . JOINT REPLACEMENT    . right shoulder durgery 25 yrs ago    . TOTAL HIP ARTHROPLASTY  2006, 2008   bilateral  . TUBAL LIGATION    . WEDGE RESECTION  2002   lung cancer    There were no vitals filed for this visit.  Subjective Assessment - 12/17/19 0733    Subjective  My Lt ankle was bothering me so I had to take some time off due to this.  I will see the MD next week to get  a shot.    Patient Stated Goals  be able to return to walking daily and walk without shuffling; be able to hike; not be afraid to go out and walk;  work out a program here so I can join a gym after PT    Currently in Pain?  No/denies                       [facility] Adult PT Treatment/Exercise - 12/17/19 0001      Knee/Hip Exercises: Aerobic   Nustep  L3 x 6 min with goal assessment      Knee/Hip Exercises: Standing   Heel Raises Limitations  alternating step taps on 6" step 2x20 each standing on balance pad    Walking with Sports Cord  resisted walking: sidestepping 15# Rt and Lt x 5-10    Other Standing Knee Exercises  tandem stance: 2x30 seconds bil. on mini tramp    Other Standing Knee Exercises  standing on balance pad       Knee/Hip Exercises: Seated   Sit to Sand  2 sets;10 reps;without UE support   on balance  pad, holding yellow ball               PT Short Term Goals - 11/19/19 0734      PT SHORT TERM GOAL #1   Title  Pt will be abel to stand single leg at least 5 sec bilateral due to imrpoved LE strength    Baseline  Lt 9 seconds, Rt 15 seconds    Status  Achieved      PT SHORT TERM GOAL #2   Title  Pt will demonstrate 5 x sit to stand in <16 sec    Baseline  9.31    Status  Achieved      PT SHORT TERM GOAL #3   Title  Pt will not stumble due to not picking up her feet more than 1x/week    Baseline  a few times- always Lt foot    Time  4    Period  Weeks    Status  On-going        PT Long Term Goals - 12/17/19 0739      PT LONG TERM GOAL #1   Title  Pt will be able to demonstrate dynamic gait with head turns and no change in gait speed    Baseline  slows down with head turns    Time  12    Period  Weeks    Status  On-going      PT LONG TERM GOAL #2   Title  Pt will demonstrate 5 x sit to stand in <12 sec for reduced risk of falls    Status  Achieved            Plan - 12/17/19 0758    Clinical Impression Statement  Pt missed  last week due to Lt ankle pain.  Pt rested and now feels much better and is able to resume activity.  Pt was challenged with sit to stand on balance pad today.  Pt had improved balance with tandem stance and alternating step taps on balance pad.  Pt required stand by assistance for safety with exercises today.  Pt will continue to benefit from skilled PT to address strength, balance and endurance.    PT Frequency  2x / week    PT Duration  12 weeks    PT Treatment/Interventions  ADLs/Self Care Home Management;Aquatic Therapy;Biofeedback;Moist Heat;Electrical Stimulation;Cryotherapy;Gait Scientist, forensic;Therapeutic activities;Therapeutic exercise;Balance training;Neuromuscular re-education;Patient/family education;Manual techniques;Taping;Dry needling;Passive range of motion    PT Next Visit Plan  balance activities, strength.  Retest Berg.    PT Home Exercise Plan  Access Code: [email or web address]    Consulted and Agree with Plan of Care  Patient       Patient will benefit from skilled therapeutic intervention in order to improve the following deficits and impairments:  Difficulty walking, Decreased balance, Decreased strength  Visit Diagnosis: Muscle weakness (generalized)  Repeated falls     Problem List Patient Active Problem List   Diagnosis Date Noted  . Fracture of superior pubic ramus ([place]) 11/28/2018  . Stricture and stenosis of esophagus 05/16/2012  . Hiatal hernia 05/16/2012  . Dysphagia, unspecified(787.20) 05/15/2012  . Cancer ([place])   . Depression   . Bipolar 1 disorder ([place])   . Anemia   . ANEMIA, IRON DEFICIENCY 09/14/2010  . GERD 09/14/2010  . Personal history of colonic polyps 09/14/2010     Sigurd Sos, PT 12/17/19 8:03 AM PHYSICAL THERAPY DISCHARGE SUMMARY  Visits from Start of Care: 11  Current functional level related to  goals / functional outcomes: Pt called to cancel all remaining appts and will continue with HEP for continued gains.     Remaining  deficits: See above for continued gains.     Education / Equipment: Balance and fall prevention, HEP Plan: Patient agrees to discharge.  Patient goals were partially met. Patient is being discharged due to being pleased with the current functional level.  ?????        Sigurd Sos, PT 01/13/20 8:52 AM  [facility] [address] [place], [place], [address] Phone: [phone]   Fax:  [phone]  Name: Megan Whitney MRN: [id number] Date of Birth: 12-28-1955

## 2019-12-18 ENCOUNTER — Ambulatory Visit: Payer: BC Managed Care – PPO | Attending: Internal Medicine

## 2019-12-18 DIAGNOSIS — Z23 Encounter for immunization: Secondary | ICD-10-CM

## 2019-12-18 NOTE — Progress Notes (Signed)
   Covid-19 Vaccination Clinic  Name:  Megan Whitney    MRN: [id number] DOB: 1956-02-12  12/18/2019  [name] was observed post Covid-19 immunization for 15 minutes without incident. She was provided with Vaccine Information Sheet and instruction to access the V-Safe system.   [name] was instructed to call 911 with any severe reactions post vaccine: Marland Kitchen Difficulty breathing  . Swelling of face and throat  . A fast heartbeat  . A bad rash all over body  . Dizziness and weakness   Immunizations Administered    Name Date Dose VIS Date Route   Moderna COVID-19 Vaccine 12/18/2019  8:16 AM 0.5 mL 08/06/2019 Intramuscular   Manufacturer: Moderna   Lot: [id number]   [place]: [id number]

## 2019-12-20 ENCOUNTER — Other Ambulatory Visit: Payer: BC Managed Care – PPO

## 2019-12-24 ENCOUNTER — Ambulatory Visit: Payer: BC Managed Care – PPO

## 2019-12-25 ENCOUNTER — Other Ambulatory Visit: Payer: Self-pay

## 2019-12-25 ENCOUNTER — Ambulatory Visit (INDEPENDENT_AMBULATORY_CARE_PROVIDER_SITE_OTHER): Payer: BC Managed Care – PPO | Admitting: Psychiatry

## 2019-12-25 DIAGNOSIS — F319 Bipolar disorder, unspecified: Secondary | ICD-10-CM | POA: Diagnosis not present

## 2019-12-25 NOTE — Progress Notes (Signed)
      [facility] Counselor/Therapist Progress Note  Patient ID: Megan Whitney, MRN: [id number],    Date: 12/25/2019  Time Spent: 60 minutes   8:00am to 9:00am  Treatment Type: Individual Therapy  Reported Symptoms:  Anxiety, depression  Mental Status Exam:  Appearance:   Neat     Behavior:  Appropriate and Sharing  Motor:  Normal  Speech/Language:   Normal Rate  Affect:  anxious, depressed  Mood:  anxious, depressed and some tearfulness  Thought process:  goal directed  Thought content:    WNL  Sensory/Perceptual disturbances:    WNL  Orientation:  oriented to person, place, time/date, situation, day of week, month of year and year  Attention:  Good/Fair  Concentration:  Good  Memory:  "episodic forgetfulness worse under stress"  Fund of knowledge:   Good  Insight:    Good/Fair  Judgment:   Good  Impulse Control:  Good/Fair   Risk Assessment: Danger to Self:  No Self-injurious Behavior: No Danger to Others: No Duty to Warn:no Physical Aggression / Violence:No  Access to Firearms a concern: No  Gang Involvement:No   Subjective: Patient today in after having a rough week emotionally although better now. Feels she is better now and taking "my meds as prescribed.'  Interventions: Cognitive Behavioral Therapy  Diagnosis:   ICD-10-CM   1. Bipolar I disorder with rapid cycling ([place])  F31.9       Plan: Patient not signing tx plan on computer screen due to [place].  Treatment Goals: Goals remain on plan as patient works on strategies to meet her goals. Progress is noted each visit in "Progress" section of goal plan.  Long Term Goal: Reduce overall level, frequency, and intensity of the anxiety so that daily functioning is not impaired.  Short Term Goal: 1.Increase understanding of the beliefs and messages that produce the worry and anxiety.  Strategies: 1.Help client develop reality-based positive cognitive messages. 2. Develop a "coping card" or other  reminder which coping strategies are recorded for patient's later use .  PROGRESS: Seemed to be doing better last visit but the "very next day I crashed, was crying a couple days, depression was worse but no SI, then leveled out this past week.  Feeling some better gradually and "more better today"  but quite stressed over a beach trip she is to take and does not want to go. Talked through the circumstances about the trip and finally decided that she can choose not to go and she feels that would be her best option.  "This will give me more time to get more leveled out after last week being a down week."  Is looking forward to another trip to beach in May with extended family members. Work reportedly going ok. Focused more on self-care as she wants" to continue moving forward and feeling more optimistic". Encouraged self-care that she has found to be helpful including: getting outside more, walking the dog, deep breathing exercises,  Connecting with her church, staying involved in knitting group, meditation, and cutting out her afternoon and evening caffeine to try and get better sleep as she feels the caffeine is definitely a factor in her sleep. Also encouraged "staying in the present" and increasing positive self-talk. Goal review and progress/challenges noted with patient.  Goal review and progress/challenges noted with patient.  Next appt within 2 weeks.   Shanon Ace, LCSW

## 2019-12-27 ENCOUNTER — Ambulatory Visit: Payer: BC Managed Care – PPO | Admitting: Podiatry

## 2019-12-28 ENCOUNTER — Encounter: Payer: Self-pay | Admitting: Physician Assistant

## 2019-12-28 DIAGNOSIS — H9319 Tinnitus, unspecified ear: Secondary | ICD-10-CM

## 2020-01-01 ENCOUNTER — Other Ambulatory Visit: Payer: Self-pay | Admitting: Psychiatry

## 2020-01-06 ENCOUNTER — Ambulatory Visit (INDEPENDENT_AMBULATORY_CARE_PROVIDER_SITE_OTHER): Payer: BC Managed Care – PPO | Admitting: Psychiatry

## 2020-01-06 ENCOUNTER — Encounter: Payer: Self-pay | Admitting: Psychiatry

## 2020-01-06 ENCOUNTER — Other Ambulatory Visit: Payer: Self-pay

## 2020-01-06 DIAGNOSIS — G251 Drug-induced tremor: Secondary | ICD-10-CM

## 2020-01-06 DIAGNOSIS — G3184 Mild cognitive impairment, so stated: Secondary | ICD-10-CM

## 2020-01-06 DIAGNOSIS — F411 Generalized anxiety disorder: Secondary | ICD-10-CM | POA: Diagnosis not present

## 2020-01-06 DIAGNOSIS — F900 Attention-deficit hyperactivity disorder, predominantly inattentive type: Secondary | ICD-10-CM | POA: Diagnosis not present

## 2020-01-06 DIAGNOSIS — S060X0S Concussion without loss of consciousness, sequela: Secondary | ICD-10-CM

## 2020-01-06 DIAGNOSIS — F3110 Bipolar disorder, current episode manic without psychotic features, unspecified: Secondary | ICD-10-CM | POA: Diagnosis not present

## 2020-01-06 MED ORDER — DEXMETHYLPHENIDATE HCL ER 25 MG PO CP24: 1.0000 | ORAL_CAPSULE | ORAL | 0 refills | Status: DC

## 2020-01-06 MED ORDER — DEXMETHYLPHENIDATE HCL 10 MG PO TABS: 10.0000 mg | ORAL_TABLET | Freq: Every day | ORAL | 0 refills | Status: DC

## 2020-01-06 MED ORDER — DEXMETHYLPHENIDATE HCL ER 25 MG PO CP24: 25.0000 mg | ORAL_CAPSULE | Freq: Every day | ORAL | 0 refills | Status: DC

## 2020-01-06 MED ORDER — DEXMETHYLPHENIDATE HCL ER 25 MG PO CP24: 1.0000 | ORAL_CAPSULE | Freq: Every morning | ORAL | 0 refills | Status: DC

## 2020-01-06 MED ORDER — LORAZEPAM 0.5 MG PO TABS: ORAL_TABLET | ORAL | 0 refills | Status: DC

## 2020-01-06 NOTE — Progress Notes (Signed)
Megan Whitney [id number] Sep 29, 1955 64 y.o.     Subjective:   Patient ID:  Megan Whitney is a 64 y.o. (DOB Jan 31, 1956) female.   Chief Complaint:  Chief Complaint  Patient presents with  . Follow-up    Medication problems and mood  . Anxiety  . Depression  . Manic Behavior    Depression        Associated symptoms include no decreased concentration and no suicidal ideas.  Past medical history includes anxiety.   Anxiety Symptoms include nervous/anxious behavior. Patient reports no confusion, decreased concentration, dizziness, nausea or suicidal ideas.    Medication Refill Associated symptoms include arthralgias. Pertinent negatives include no nausea or weakness.   Megan Whitney is  follow-up of r chronic mood swings and anxiety and frequent changes in medications.   At visit December 27, 2018.  Focalin XR was increased from 20 mg to 25 mg daily to help with focus and attention and potentially mood.  When seen February 13, 2019.  In an effort to reduce mood cycling we reduce fluoxetine to 20 mg daily.  At visit August 2020.  No meds were changed.  She continued the following: Focalin XR 25 mg every morning and Focalin 10 mg immediate release daily Equetro 200 mg nightly Fluoxetine 20 mg daily Lamotrigine 200 mg twice daily Lithium 150 mg nightly Vraylar 3 mg daily  She called back November 4 after seeing her therapist stating that she was having some hypomanic symptoms with reduced sleep and increased energy.  This potentiality had been discussed and the decision was made to increase Equetro from 200 mg nightly to 300 mg nightly.  seen August 12, 2019.  Because of balance problems she did not tolerate Equetro 300 mg nightly and it was changed to Equetro 200 mg nightly plus immediate release carbamazepine 100 mg nightly.  Her mood had not been stable enough on Equetro 200 mg nightly alone. Less balance problems with change in CBZ.  seen September 23, 2019.  The following  was changed: For bipolar mixed increase CBZ IR to 200 mg HS.  Disc fall and balance risks.For bipolar mixed increase CBZ IR to 200 mg HS.  Disc fall and balance risks.  She called back October 23, 2019 stating she had had another fall and felt it was due to the medication.  Therefore carbamazepine immediate release was reduced from 200 mg nightly to 100 mg nightly.  The Equetro is unchanged.  Last seen November 04, 2019.  The following was noted:  Better at the moment but balance is still somewhat of a problem.  Started PT to help balance.  Had a fall after tripping on a curb and hit her head on sidewalk.  Got a concussion with nausea and HA and dizziness and light sensitivity.  Not over it.  Concentration problems.  Has gotten back to work after a week.   Mood sx pretty good with some mild depression.  Nothing severe.  Trying to minimize stress and self care as much as possible.  No manic sx lately and sleeping fairly well.  No racing thoughts.   Working another year and plans to retire but H alcoholic and not sure it will be good to be there all the time. Seeing therapist q 2 weeks.  Therapy helping . Recent serum vitamin D level was determined to be low at 33.  The goal and chronically depressed patient's is in the 50s if possible.  So her vitamin D was increased on  August 08, 2018 or thereabouts.  Checked vitamin D level again and this time it was high at 120 and so it was stopped.  She's restarted per PCP at 1000 units daily.  01/06/2020 appointment the following is noted: Still on: Focalin XR 25 mg every morning and Focalin 10 mg immediate release daily Equetro 200 mg nightly Carbamazepine immediate release 100 mg nightly Fluoxetine 20 mg daily Lamotrigine 200 mg twice daily Lithium 150 mg nightly Vraylar 3 mg daily  Not good manic.  Angry.  Missed 2 days bc sx.  Last week vacation which didn't go well.  Crying last week and missed a day.  "Pissed off at the whole world" but also depressed  and hard to get OOB today.  Everything makes me angry.   Blows up without control.  Then regrets it.  Sleep irregular lately. Finished PT which might have helped some but still balance problems.  Past Psychiatric Medication Trials: Vrayla 4.5 SE mouth movements reduced to 3 mg 3/20 lithium 150,  Latuda 80, Trileptal 450, olanzapine, Seroquel, risperidone, Abilify Focalin,  Ritalin,  Depakote, Equetro 300 balance issues, Lamictal 200 twice daily, fluoxetine 60,  buspirone, sertraline 100, Wellbutrin history of facial tics, paroxetine cognitive side effects ropinirole, amantadine, Sinemet, Artane, Cogentin, trazodone hangover, Ambien hangover,  Review of Systems:  Review of Systems  HENT: Positive for tinnitus.        Chirping cricket sounds in hears since January  Gastrointestinal: Negative for nausea.  Musculoskeletal: Positive for arthralgias. Negative for gait problem.  Neurological: Positive for tremors. Negative for dizziness, seizures, syncope and weakness.       Still balance problems. No falls lately.  Psychiatric/Behavioral: Positive for depression. Negative for agitation, behavioral problems, confusion, decreased concentration, dysphoric mood, hallucinations, self-injury, sleep disturbance and suicidal ideas. The patient is nervous/anxious. The patient is not hyperactive.   No falls since here. Not currently depressed but unable to remove this from the list.  Medications: I have reviewed the patient's current medications.  Current Outpatient Medications  Medication Sig Dispense Refill  . atorvastatin (LIPITOR) 10 MG tablet Take 10 mg by mouth daily.    . carbamazepine (TEGRETOL) 100 MG chewable tablet Chew 1 tablet (100 mg total) by mouth at bedtime. 90 tablet 1  . cariprazine (VRAYLAR) capsule Take 1 capsule (3 mg total) by mouth daily. 30 capsule 2  . cetirizine (ZYRTEC) 10 MG tablet Take 10 mg by mouth daily.    . Cholecalciferol (VITAMIN D3 PO) Take 1,000 tablets by mouth  daily.    Derrill Memo ON 02/03/2020] dexmethylphenidate (FOCALIN) 10 MG tablet Take 1 tablet (10 mg total) by mouth daily after lunch. 30 tablet 0  . [START ON 03/02/2020] dexmethylphenidate (FOCALIN) 10 MG tablet Take 1 tablet (10 mg total) by mouth daily. 30 tablet 0  . Dexmethylphenidate HCl (FOCALIN XR) 25 MG CP24 Take 1 capsule by mouth every morning. 30 capsule 0  . [START ON 02/03/2020] Dexmethylphenidate HCl 25 MG CP24 Take 25 mg by mouth daily. 30 capsule 0  . EQUETRO 200 MG CP12 12 hr capsule TAKE 1 CAPSULE(200 MG) BY MOUTH AT BEDTIME 30 capsule 1  . ferrous gluconate (FERGON) 324 MG tablet Take 324 mg by mouth daily with breakfast.    . lamoTRIgine (LAMICTAL) 200 MG tablet TAKE 1 TABLET(200 MG) BY MOUTH TWICE DAILY 60 tablet 2  . lithium carbonate 150 MG capsule Take 1 capsule (150 mg total) by mouth daily in the afternoon. 90 capsule 1  . meclizine (ANTIVERT)  25 MG tablet Take 1 tablet (25 mg total) by mouth 3 (three) times daily as needed for dizziness. 30 tablet 0  . Multiple Vitamin (MULTIVITAMIN) tablet Take 1 tablet by mouth daily.    Marland Kitchen omeprazole (PRILOSEC) 20 MG capsule Take 20 mg by mouth at bedtime.    . ondansetron (ZOFRAN ODT) 4 MG disintegrating tablet Take 1 tablet (4 mg total) by mouth every 8 (eight) hours as needed for nausea or vomiting. 20 tablet 0  . pantoprazole (PROTONIX) 40 MG tablet Take 1 tablet (40 mg total) by mouth daily. 90 tablet 0  . dexmethylphenidate (FOCALIN) 10 MG tablet Take 1 tablet (10 mg total) by mouth daily after lunch. 30 tablet 0  . [START ON 03/02/2020] Dexmethylphenidate HCl (FOCALIN XR) 25 MG CP24 Take 1 tablet by mouth in the morning. 30 capsule 0  . LORazepam (ATIVAN) 0.5 MG tablet 1-2 tablets every 8 hours as needed for agitation and anxiety 50 tablet 0   No current facility-administered medications for this visit.    Medication Side Effects: Other: tremor and weight gain.  Dyskinesia appears better  SE bettter than they were.  Balance  problems intermittently  Allergies:  Allergies  Allergen Reactions  . Azithromycin Anaphylaxis  . Penicillins Anaphylaxis    DID THE REACTION INVOLVE: Swelling of the face/tongue/throat, SOB, or low BP? Yes Sudden or severe rash/hives, skin peeling, or the inside of the mouth or nose? Yes Did it require medical treatment? No When did it last happen? If all above answers are "NO", may proceed with cephalosporin use.  . Adhesive [Tape] Other (See Comments)    On bandaids    Past Medical History:  Diagnosis Date  . Allergy    Seasonal  . Anemia    History of GI blood loss  . Anxiety   . Arthritis   . Bipolar 1 disorder ([place])   . Colon polyps   . Depression   . Epistaxis    Around 2011 or 2012, required cauterization.   . Esophageal stricture   . GERD (gastroesophageal reflux disease)   . Headache(784.0)   . Hyperlipidemia   . Interstitial cystitis   . Lung cancer ([place]) 2002  . Obesity   . Sleep apnea    Doesn't use a CPAP    Family History  Problem Relation Age of Onset  . Arthritis Mother   . Hearing loss Mother   . Hyperlipidemia Mother   . Hypertension Mother   . Hypertension Father   . Diabetes Mellitus II Father   . Heart disease Father   . Arthritis Father   . Cancer Father        Brain  . COPD Father   . Diabetes Father   . Hyperlipidemia Father   . Early death Sister        Aneroxia/Bulimic  . Depression Brother   . Early death Investment banker, corporate  . Depression Daughter   . Drug abuse Daughter   . Heart disease Daughter   . Hypertension Daughter   . Stroke Maternal Grandmother   . Hypertension Maternal Grandmother   . Arthritis Maternal Grandfather   . Heart attack Maternal Grandfather   . Hearing loss Maternal Grandfather   . Colon cancer Neg Hx   . Esophageal cancer Neg Hx   . Rectal cancer Neg Hx     Social History   Socioeconomic History  . Marital status: Married    Spouse name: Not  on file  . Number of children: 1   . Years of education: Not on file  . Highest education level: Not on file  Occupational History  . Occupation: Admin. assistant  Tobacco Use  . Smoking status: Never Smoker  . Smokeless tobacco: Never Used  Substance and Sexual Activity  . Alcohol use: Yes    Alcohol/week: 1.0 standard drinks    Types: 1 Glasses of wine per week    Comment: Moderate  . Drug use: No  . Sexual activity: Not Currently  Other Topics Concern  . Not on file  Social History Narrative  . Not on file   Social Determinants of Health   Financial Resource Strain:   . Difficulty of Paying Living Expenses:   Food Insecurity:   . Worried About Charity fundraiser in the Last Year:   . Arboriculturist in the Last Year:   Transportation Needs:   . Film/video editor (Medical):   Marland Kitchen Lack of Transportation (Non-Medical):   Physical Activity:   . Days of Exercise per Week:   . Minutes of Exercise per Session:   Stress:   . Feeling of Stress :   Social Connections:   . Frequency of Communication with Friends and Family:   . Frequency of Social Gatherings with Friends and Family:   . Attends Religious Services:   . Active Member of Clubs or Organizations:   . Attends Archivist Meetings:   Marland Kitchen Marital Status:   Intimate Partner Violence:   . Fear of Current or Ex-Partner:   . Emotionally Abused:   Marland Kitchen Physically Abused:   . Sexually Abused:     Past Medical History, Surgical history, Social history, and Family history were reviewed and updated as appropriate.   Please see review of systems for further details on the patient's review from today.   Objective:   Physical Exam:  There were no vitals taken for this visit.  Physical Exam Constitutional:      General: She is not in acute distress.    Appearance: She is well-developed.  Musculoskeletal:        General: No deformity.  Neurological:     Mental Status: She is alert and oriented to person, place, and time.     Cranial  Nerves: No dysarthria.     Motor: Tremor present.     Coordination: Coordination normal.  Psychiatric:        Attention and Perception: Attention and perception normal. She does not perceive auditory or visual hallucinations.        Mood and Affect: Mood is anxious and depressed. Affect is angry. Affect is not labile, blunt, tearful or inappropriate.        Speech: Speech normal. Speech is not rapid and pressured or slurred.        Behavior: Behavior is agitated. Behavior is cooperative.        Thought Content: Thought content normal. Thought content is not paranoid or delusional. Thought content does not include homicidal or suicidal ideation. Thought content does not include homicidal or suicidal plan.        Cognition and Memory: Cognition normal. She exhibits impaired recent memory.        Judgment: Judgment normal.     Comments: Insight fair. More manic sx Not agitated in office     Lab Review:     Component Value Date/Time   NA 141 04/04/2019 1004   K 4.5 04/04/2019 1004  CL 104 04/04/2019 1004   CO2 29 04/04/2019 1004   GLUCOSE 88 04/04/2019 1004   BUN 28 (H) 04/04/2019 1004   CREATININE 0.83 04/04/2019 1004   CALCIUM 9.9 04/04/2019 1004   PROT 7.0 04/04/2019 1004   ALBUMIN 4.5 04/04/2019 1004   AST 19 04/04/2019 1004   ALT 13 04/04/2019 1004   ALKPHOS 70 04/04/2019 1004   BILITOT 0.3 04/04/2019 1004   GFRNONAA >60 10/21/2018 0858   GFRAA >60 10/21/2018 0858       Component Value Date/Time   WBC 7.7 04/04/2019 1004   RBC 4.52 04/04/2019 1004   HGB 13.9 04/04/2019 1004   HCT 41.7 04/04/2019 1004   PLT 242.0 04/04/2019 1004   MCV 92.2 04/04/2019 1004   MCH 29.2 10/21/2018 0858   MCHC 33.3 04/04/2019 1004   RDW 14.4 04/04/2019 1004   LYMPHSABS 2.3 04/04/2019 1004   MONOABS 0.7 04/04/2019 1004   EOSABS 1.0 (H) 04/04/2019 1004   BASOSABS 0.1 04/04/2019 1004  Vitamin D level 33 on 10K units daily on 12/4/`9 Increased to prescription vitamin d 50K units Monday,  Wed, Friday.  Rx sent in.   Lithium Lvl  Date Value Ref Range Status  10/21/2018 0.18 (L) 0.60 - 1.20 mmol/L Final    Comment:    Performed at [facility], [address], [place], [place] [address]     No results found for: PHENYTOIN, PHENOBARB, VALPROATE, CBMZ   .res Assessment: Plan:    Bipolar I disorder, most recent episode (or current) manic ([place]) - Plan: LORazepam (ATIVAN) 0.5 MG tablet  Generalized anxiety disorder  Attention deficit hyperactivity disorder (ADHD), predominantly inattentive type - Plan: Dexmethylphenidate HCl (FOCALIN XR) 25 MG CP24, dexmethylphenidate (FOCALIN) 10 MG tablet, dexmethylphenidate (FOCALIN) 10 MG tablet, Dexmethylphenidate HCl 25 MG CP24, Dexmethylphenidate HCl (FOCALIN XR) 25 MG CP24, dexmethylphenidate (FOCALIN) 10 MG tablet  Mild cognitive impairment  Concussion without loss of consciousness, sequela (HCC)  Tremor due to multiple drugs  Greater than 50% of 30 min face to face time with patient was spent on counseling and coordination of care. We discussed the following: Megan Whitney has chronic rapid cycling bipolar disorder which is chronically unstable and has been difficult to control.  The rapid cycling is making it difficult to control frequency of depressive episodes and the anxiety as well.  We have typically had to make frequent med changes.  We have had to reduce mood stabilizer dosages including Vraylar and Equetro and and then reduced fluoxetine which may be contributing to the rapid cycling.  Overall rapid cycling pattern continues.  Since last year when she was stable she started having hypomanic symptoms again.  We increased Equetro back to 300 mg nightly but she she has had balance problems.  It had stopped the manic symptoms but then she started having balance problems again and we had to switch it back to Equetro 300 and add carbamazepine immediate release 100 nightly.. Less neurological problems with reduction in Vraylar.   It could  have been her prior neurologic symptoms were related to the combination of medications.  But I am hesitant to increase the Vraylar unless absolutely necessary.  Lately more agitated manic sx. because work is affected she feels like she needs immediate assistance to help lower the symptoms.  We discussed how antidepressants can increase manic symptoms.  It is likely that she is experience seasonal hypomania or mania.  Due to the severity of her symptoms she does not feel that she can wait for  resolution simply by stopping the fluoxetine.  Fluoxetine has a long half-life and it may take a while before the effects go away she is very sensitive to extraparametal side effects from antipsychotics and sedative effects of other medications.  Could consider Caplyta. Temporarily Ativan for agitation 0.5 mg tablets  DT mania stop fluoxetine If fails trial loxapine We discussed the potential cognitive side effects and balance problems that could be associated with lorazepam.  However typically lorazepam has less of these types of problems done do the other benzodiazepines.   Tolerating Equetro 200 and CBZ IR 100 mg HS with other meds at this point.  Unfortunately she had more dizziness and had a fall with higher doses of carbamazepine..  The carbamazepine did help with the mixed symptoms and cycling but she could not tolerate the degree of dizziness and reduce the dose of the immediate release back to 100 nightly.  Her balance is better with the reduction in dosage.   ADD managed with Focalin.  Discussed the risk of stimulants including that that could contribute to mood instability and mood swings.  She appears to need it in order to function effectively at work.  She has a high residual anxiety.  It has been impossible to control all of her symptoms simultaneously without causing side effects.  She agrees.  Continue the following Discussed side effects of each medicine. Focalin XR 25 mg every morning and  Focalin 10 mg immediate release daily Equetro 200 mg nightly Carbamazepine immediate release 100 mg nightly  STOP Fluoxetine 20 mg daily Lamotrigine 200 mg twice daily Lithium 150 mg nightly Vraylar 3 mg daily  Disc the head CT results bc she had questions about the stable mild atrophy.  BC new onset of weeks of "chirping" sounds constantly in her both ears consider ENT evaluation given also having worsening balance issues.  Consider inner ear problems.  Discussed potential metabolic side effects associated with atypical antipsychotics, as well as potential risk for movement side effects. Advised pt to contact office if movement side effects occur.  She may be having some mild TD with toe movement.  Disc SE meds and this is heightened by the complication of necessary polypharmacy.  FU results of PT for balance.    Supportive therapy in terms of dealing with husband's addiction.  She's interested in Bipolar Support Group and info given.  Requires frequent FU DT chronic instability.  FU 2-3 weeks.  Lynder Parents MD, DFAPA  Please see After Visit Summary for patient specific instructions.  Future Appointments  Date Time Provider [place]  01/08/2020  8:00 AM Shanon Ace, LCSW [facility] None  01/22/2020  8:00 AM Shanon Ace, LCSW [facility] None  02/10/2020  8:00 AM Shanon Ace, LCSW [facility] None  02/21/2020  3:15 PM Felipa Furnace, DPM [facility]  02/21/2020  5:00 PM [facility] DX DEXA 1 [facility]  02/24/2020  8:00 AM Shanon Ace, LCSW [facility] None    No orders of the defined types were placed in this encounter.     -------------------------------

## 2020-01-06 NOTE — Patient Instructions (Signed)
Stop fluoxetine  Start lorazepam

## 2020-01-08 ENCOUNTER — Other Ambulatory Visit: Payer: Self-pay

## 2020-01-08 ENCOUNTER — Ambulatory Visit (INDEPENDENT_AMBULATORY_CARE_PROVIDER_SITE_OTHER): Payer: BC Managed Care – PPO | Admitting: Psychiatry

## 2020-01-08 DIAGNOSIS — F3110 Bipolar disorder, current episode manic without psychotic features, unspecified: Secondary | ICD-10-CM | POA: Diagnosis not present

## 2020-01-08 NOTE — Progress Notes (Signed)
[facility] Counselor/Therapist Progress Note  Patient ID: Megan Whitney, MRN: [id number],    Date: 01/08/2020  Time Spent: 60 minutes  8:00am to 9:00am  Treatment Type: Individual Therapy  Reported Symptoms: "anxiety, calmer overall" (Ativan added and Prozac stopped by Dr. Clovis Pu), depression, some "teariness for no particular reason", avoidant "not really feeling social right now"  Mental Status Exam:  Appearance:   Well Groomed     Behavior:  Appropriate, Sharing and Motivated  Motor:  Normal  Speech/Language:   Normal Rate  Affect:  anxious, some depression  Mood:  anxious and depressed  Thought process:  goal directed  Thought content:    WNL  Sensory/Perceptual disturbances:    WNL  Orientation:  oriented to person, place, time/date, situation, day of week, month of year and year  Attention:  Good  Concentration:  Good  Memory:  "Patient reporting none recently"  Fund of knowledge:   Good  Insight:    Good  Judgment:   Good and Fair  Impulse Control:  Good and Fair   Risk Assessment: Danger to Self:  No Self-injurious Behavior: No Danger to Others: No Duty to Warn:no Physical Aggression / Violence:No  Access to Firearms a concern: No  Gang Involvement:No   Subjective: Patient in today with anxiety, depression.  Reports being hypomanic since last appt and had stressful trip to beach.  More calmer now and states she is back on track "with my self-care discussed in last session."   Interventions: Cognitive Behavioral Therapy and Solution-Oriented/Positive Psychology  Diagnosis:   ICD-10-CM   1. Bipolar I disorder, most recent episode (or current) manic ([place])  F31.10      Plan: Patient not signing tx plan on computer screen due to [place].  Treatment Goals: Goals remain on plan as patient works on strategies to meet her goals. Progress is noted each visit in "Progress" section of goal plan.  Long Term Goal: Reduce overall level, frequency, and  intensity of the anxiety so that daily functioning is not impaired.  Short Term Goal: 1.Increase understanding of the beliefs and messages that produce the worry and anxiety.  Strategies: 1.Help client develop reality-based positive cognitive messages. 2. Develop a "coping card" or other reminder which coping strategies are recorded for patient's later use .  PROGRESS: Patient in today reporting anxiety, irritated with husband, "but better than I was when I went on beach trip that you told me not to go on because I didn't want to go."  Discussed this with patient to clarify that therapist did not tell her what to do but did support her to make her best decision possible when she was saying she did not want to go on trip due to husband and friend's drinking. Trip was "very drama-filled, negative, and not relaxing, and I was hypomanic."  Processed some of that week with patient.  Also shared some work stressors/concerns that she needed to talk through today. She is scheduled to go on another beach trip last week in May with different group and hopes that will go better, and already planning ahead in positive ways. Today, reports hypermania has gone, and is feeling more grounded "being back in my regular environment". States she is back on track with her self-care in staying involved with her knitting group, attending church, staying off caffeine as that helps her sleep better, staying in the present, finds coping card useful but "I need to remember to keep it with me", and improving her  self-talk to be more positive, and continuing deep breathing exercises that she finds helpful.    Goal review and progress noted.  Next appt within 2 weeks.   Shanon Ace, LCSW

## 2020-01-09 ENCOUNTER — Ambulatory Visit: Payer: BC Managed Care – PPO

## 2020-01-13 ENCOUNTER — Encounter: Payer: Self-pay | Admitting: Physician Assistant

## 2020-01-13 ENCOUNTER — Other Ambulatory Visit: Payer: Self-pay | Admitting: Psychiatry

## 2020-01-13 DIAGNOSIS — E6609 Other obesity due to excess calories: Secondary | ICD-10-CM

## 2020-01-13 DIAGNOSIS — F319 Bipolar disorder, unspecified: Secondary | ICD-10-CM

## 2020-01-15 ENCOUNTER — Telehealth: Payer: Self-pay | Admitting: Psychiatry

## 2020-01-15 NOTE — Telephone Encounter (Signed)
Pt would like to know if you can fit her in. Pt said that she is a mess. Depression is worst and she is quitting her job today, but really needs to see you. Pt not functioning well.

## 2020-01-15 NOTE — Telephone Encounter (Signed)
Put her on the cancellation list for 30 minutes

## 2020-01-15 NOTE — Telephone Encounter (Signed)
Put pt on cancel list. Appt 5/21 currently.

## 2020-01-19 ENCOUNTER — Encounter (HOSPITAL_COMMUNITY): Payer: Self-pay | Admitting: Behavioral Health

## 2020-01-19 ENCOUNTER — Emergency Department (HOSPITAL_COMMUNITY)
Admission: EM | Admit: 2020-01-19 | Discharge: 2020-01-21 | Disposition: A | Discharge status: Other Acute Inpatient Hospital | Payer: BC Managed Care – PPO | Attending: Emergency Medicine | Admitting: Emergency Medicine

## 2020-01-19 ENCOUNTER — Other Ambulatory Visit: Payer: Self-pay

## 2020-01-19 DIAGNOSIS — R45851 Suicidal ideations: Secondary | ICD-10-CM | POA: Diagnosis not present

## 2020-01-19 DIAGNOSIS — Z20822 Contact with and (suspected) exposure to covid-19: Secondary | ICD-10-CM | POA: Insufficient documentation

## 2020-01-19 DIAGNOSIS — F329 Major depressive disorder, single episode, unspecified: Secondary | ICD-10-CM | POA: Diagnosis not present

## 2020-01-19 DIAGNOSIS — R519 Headache, unspecified: Secondary | ICD-10-CM | POA: Insufficient documentation

## 2020-01-19 DIAGNOSIS — F315 Bipolar disorder, current episode depressed, severe, with psychotic features: Secondary | ICD-10-CM | POA: Insufficient documentation

## 2020-01-19 DIAGNOSIS — F900 Attention-deficit hyperactivity disorder, predominantly inattentive type: Secondary | ICD-10-CM

## 2020-01-19 DIAGNOSIS — Z79899 Other long term (current) drug therapy: Secondary | ICD-10-CM | POA: Diagnosis not present

## 2020-01-19 DIAGNOSIS — Z751 Person awaiting admission to adequate facility elsewhere: Secondary | ICD-10-CM

## 2020-01-19 DIAGNOSIS — F314 Bipolar disorder, current episode depressed, severe, without psychotic features: Secondary | ICD-10-CM | POA: Diagnosis not present

## 2020-01-19 DIAGNOSIS — I10 Essential (primary) hypertension: Secondary | ICD-10-CM | POA: Diagnosis not present

## 2020-01-19 LAB — CBC
HCT: 39.6 % (ref 36.0–46.0)
Hemoglobin: 12.7 g/dL (ref 12.0–15.0)
MCH: 30.3 pg (ref 26.0–34.0)
MCHC: 32.1 g/dL (ref 30.0–36.0)
MCV: 94.5 fL (ref 80.0–100.0)
Platelets: 250 10*3/uL (ref 150–400)
RBC: 4.19 MIL/uL (ref 3.87–5.11)
RDW: 13.9 % (ref 11.5–15.5)
WBC: 7.4 10*3/uL (ref 4.0–10.5)
nRBC: 0 % (ref 0.0–0.2)

## 2020-01-19 LAB — COMPREHENSIVE METABOLIC PANEL
ALT: 13 U/L (ref 0–44)
AST: 20 U/L (ref 15–41)
Albumin: 3.9 g/dL (ref 3.5–5.0)
Alkaline Phosphatase: 65 U/L (ref 38–126)
Anion gap: 8 (ref 5–15)
BUN: 16 mg/dL (ref 8–23)
CO2: 26 mmol/L (ref 22–32)
Calcium: 9.2 mg/dL (ref 8.9–10.3)
Chloride: 107 mmol/L (ref 98–111)
Creatinine, Ser: 0.75 mg/dL (ref 0.44–1.00)
GFR calc Af Amer: >60 mL/min (ref >60)
GFR calc non Af Amer: >60 mL/min (ref >60)
Glucose, Bld: 102 mg/dL — ABNORMAL HIGH (ref 70–99)
Potassium: 4.2 mmol/L (ref 3.5–5.1)
Sodium: 141 mmol/L (ref 135–145)
Total Bilirubin: 0.4 mg/dL (ref 0.3–1.2)
Total Protein: 6.9 g/dL (ref 6.5–8.1)

## 2020-01-19 LAB — RAPID URINE DRUG SCREEN, HOSP PERFORMED
Amphetamines: NOT DETECTED
Barbiturates: NOT DETECTED
Benzodiazepines: NOT DETECTED
Cocaine: NOT DETECTED
Opiates: NOT DETECTED
Tetrahydrocannabinol: NOT DETECTED

## 2020-01-19 LAB — ETHANOL: Alcohol, Ethyl (B): <10 mg/dL (ref <10)

## 2020-01-19 LAB — SARS CORONAVIRUS 2 BY RT PCR (HOSPITAL ORDER, PERFORMED IN ~~LOC~~ HOSPITAL LAB): SARS Coronavirus 2: NEGATIVE

## 2020-01-19 LAB — ACETAMINOPHEN LEVEL: Acetaminophen (Tylenol), Serum: <10 ug/mL — ABNORMAL LOW (ref 10–30)

## 2020-01-19 LAB — SALICYLATE LEVEL: Salicylate Lvl: <7 mg/dL — ABNORMAL LOW (ref 7.0–30.0)

## 2020-01-19 MED ORDER — LORAZEPAM 0.5 MG PO TABS
0.5000 mg | ORAL_TABLET | Freq: Three times a day (TID) | ORAL | Status: DC | PRN
  Administered 2020-01-19: 1 mg via ORAL
  Filled 2020-01-19: qty 2

## 2020-01-19 MED ORDER — CARBAMAZEPINE 100 MG PO CHEW
100.0000 mg | CHEWABLE_TABLET | Freq: Every day | ORAL | Status: DC
  Administered 2020-01-19 – 2020-01-20 (×2): 100 mg via ORAL
  Filled 2020-01-19 (×2): qty 1

## 2020-01-19 MED ORDER — ACETAMINOPHEN 325 MG PO TABS
325.0000 mg | ORAL_TABLET | Freq: Four times a day (QID) | ORAL | Status: DC | PRN
  Administered 2020-01-19 – 2020-01-20 (×2): 650 mg via ORAL
  Filled 2020-01-19 (×2): qty 2

## 2020-01-19 MED ORDER — FERROUS GLUCONATE 324 (38 FE) MG PO TABS
324.0000 mg | ORAL_TABLET | Freq: Every day | ORAL | Status: DC
  Administered 2020-01-20 – 2020-01-21 (×2): 324 mg via ORAL
  Filled 2020-01-19 (×2): qty 1

## 2020-01-19 MED ORDER — ACETAMINOPHEN 325 MG PO TABS
650.0000 mg | ORAL_TABLET | Freq: Once | ORAL | Status: AC
  Administered 2020-01-19: 650 mg via ORAL
  Filled 2020-01-19: qty 2

## 2020-01-19 MED ORDER — LITHIUM CARBONATE 150 MG PO CAPS
150.0000 mg | ORAL_CAPSULE | ORAL | Status: DC
  Administered 2020-01-19 – 2020-01-20 (×2): 150 mg via ORAL
  Filled 2020-01-19 (×3): qty 1

## 2020-01-19 MED ORDER — DEXMETHYLPHENIDATE HCL ER 5 MG PO CP24
5.0000 mg | ORAL_CAPSULE | ORAL | Status: DC
  Administered 2020-01-21: 5 mg via ORAL
  Filled 2020-01-19: qty 1

## 2020-01-19 MED ORDER — PANTOPRAZOLE SODIUM 40 MG PO TBEC
40.0000 mg | DELAYED_RELEASE_TABLET | Freq: Every day | ORAL | Status: DC
  Administered 2020-01-19 – 2020-01-21 (×3): 40 mg via ORAL
  Filled 2020-01-19 (×3): qty 1

## 2020-01-19 MED ORDER — LAMOTRIGINE 100 MG PO TABS
200.0000 mg | ORAL_TABLET | Freq: Two times a day (BID) | ORAL | Status: DC
  Administered 2020-01-19 – 2020-01-21 (×5): 200 mg via ORAL
  Filled 2020-01-19 (×5): qty 2

## 2020-01-19 MED ORDER — CARIPRAZINE HCL 1.5 MG PO CAPS
3.0000 mg | ORAL_CAPSULE | Freq: Every day | ORAL | Status: DC
  Administered 2020-01-20 – 2020-01-21 (×2): 3 mg via ORAL
  Filled 2020-01-19 (×2): qty 2

## 2020-01-19 MED ORDER — DEXMETHYLPHENIDATE HCL 5 MG PO TABS
10.0000 mg | ORAL_TABLET | Freq: Every day | ORAL | Status: DC
  Administered 2020-01-20: 10 mg via ORAL
  Filled 2020-01-19: qty 2

## 2020-01-19 MED ORDER — CARBAMAZEPINE ER 200 MG PO CP12
200.0000 mg | ORAL_CAPSULE | Freq: Every day | ORAL | Status: DC
  Administered 2020-01-19 – 2020-01-20 (×2): 200 mg via ORAL
  Filled 2020-01-19 (×2): qty 1

## 2020-01-19 MED ORDER — FLUOXETINE HCL 20 MG PO CAPS
20.0000 mg | ORAL_CAPSULE | Freq: Every day | ORAL | Status: DC
  Administered 2020-01-19 – 2020-01-21 (×3): 20 mg via ORAL
  Filled 2020-01-19 (×3): qty 1

## 2020-01-19 MED ORDER — DEXMETHYLPHENIDATE HCL ER 20 MG PO CP24
20.0000 mg | ORAL_CAPSULE | ORAL | Status: DC
  Administered 2020-01-21: 20 mg via ORAL
  Filled 2020-01-19: qty 1

## 2020-01-19 MED ORDER — ATORVASTATIN CALCIUM 10 MG PO TABS
10.0000 mg | ORAL_TABLET | Freq: Every day | ORAL | Status: DC
  Administered 2020-01-19 – 2020-01-21 (×3): 10 mg via ORAL
  Filled 2020-01-19 (×3): qty 1

## 2020-01-19 MED ORDER — VITAMIN D3 25 MCG (1000 UNIT) PO TABS
1000.0000 [IU] | ORAL_TABLET | Freq: Every day | ORAL | Status: DC
  Administered 2020-01-20 – 2020-01-21 (×2): 1000 [IU] via ORAL
  Filled 2020-01-19 (×2): qty 1

## 2020-01-19 NOTE — ED Triage Notes (Signed)
Pt to Ed with c/o of Suicidal Ideation. Pt is tearful and upset explaining having bipolar for some time now and on medications "being up and down" This week has been down and feels depressed and overwhelmed dealing with her bipolar and "hit bottom" Pt states having a bottle in her hand of pills and glass of water in her other hand with intentions of taking for a OD.

## 2020-01-19 NOTE — Progress Notes (Signed)
Received Megan Whitney at the change of shift awake in her room with the sitter at the bedside. Later she was compliant with her medication. She slept and woke up at 0420 hrs and unable to drift back to sleep. She  was able to sleep from 0500 hrs to the end of this writers shift a 0730 hrs.

## 2020-01-19 NOTE — BH Assessment (Signed)
Patient meets inpatient criteria. Patient referred to the following hospitals for consideration of a bed:   [facility] Details  [facility] Details  [facility] Details  [facility] Details  [facility] Details  [facility] Details  [facility] Details  [facility] Details  [facility] Details  [facility] Details  [facility] Details  [facility] Details  [facility] Details  [facility] Details  [facility] Details  [facility] Details  [facility] Details  [facility] Details  [facility] Details  [facility] Details  [facility] Details  [place]
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
## 2020-01-19 NOTE — ED Provider Notes (Signed)
[facility] DEPT Provider Note   CSN: [id number] Arrival date & time: 01/19/20  1125     History Chief Complaint  Patient presents with  . Suicidal    Megan Whitney is a 64 y.o. female with past medical history significant for bipolar 1 disorder, lung cancer, anemia presents to emergency department today with chief complaint of suicidal ideations.  Patient's husband is at the bedside and is contributing historian.  Patient states she has had intermittent suicidal ideations for the last several years.  She denies any suicide attempt.  Patient states today she feels overwhelmed and very depressed.  She feels like she has "hit her bottom."  She had her medications lined up in front of her with a glass of water in her hand with a plan to overdose.  She called the suicide hotline and was placed on hold.  She then called her psychiatrist and was able to talk to the doctor on-call who recommended she come to the emergency department for further evaluation.  Patient does admit to being under a lot of life stressors.  She states her therapist recently changed her medications to stop taking the Prozac and add Ativan as needed for anxiety.  She states the Ativan helps her anxiety however she thinks it makes her feel more depressed after taking.  Her husband also states for the last x1 month patient has increased episodes of rage.  He states the simplest things make her extremely angry which is new. Patient admits to a headache and that has progressively worsened since onset. It started this morning and feels like headaches she has had in the past when upset. She denies any associated neck pain or stiffness. Denies fever, syncope, head trauma, photophobia, phonophobia, UL throbbing, N/V, visual changes, rash, or "thunderclap" onset. She denies homicidal ideations and visual or auditory hallucinations. She endorses occasional alcohol consumption stating she had a glass of wine last  night. Denies any drug use. Denies history of psychiatric admissions.     Past Medical History:  Diagnosis Date  . Allergy    Seasonal  . Anemia    History of GI blood loss  . Anxiety   . Arthritis   . Bipolar 1 disorder ([place])   . Colon polyps   . Depression   . Epistaxis    Around 2011 or 2012, required cauterization.   . Esophageal stricture   . GERD (gastroesophageal reflux disease)   . Headache(784.0)   . Hyperlipidemia   . Interstitial cystitis   . Lung cancer ([place]) 2002  . Obesity   . Sleep apnea    Doesn't use a CPAP    Patient Active Problem List   Diagnosis Date Noted  . Fracture of superior pubic ramus ([place]) 11/28/2018  . Stricture and stenosis of esophagus 05/16/2012  . Hiatal hernia 05/16/2012  . Dysphagia, unspecified(787.20) 05/15/2012  . Cancer ([place])   . Depression   . Bipolar 1 disorder ([place])   . Anemia   . ANEMIA, IRON DEFICIENCY 09/14/2010  . GERD 09/14/2010  . Personal history of colonic polyps 09/14/2010    Past Surgical History:  Procedure Laterality Date  . BALLOON DILATION  05/16/2012   Procedure: BALLOON DILATION;  Surgeon: Inda Castle, MD;  Location: [facility];  Service: Endoscopy;  Laterality: N/A;  . COLONOSCOPY    . ENTEROSCOPY  05/16/2012   Procedure: ENTEROSCOPY;  Surgeon: Inda Castle, MD;  Location: [facility];  Service: Endoscopy;  Laterality: N/A;  .  JOINT REPLACEMENT    . right shoulder durgery 25 yrs ago    . TOTAL HIP ARTHROPLASTY  2006, 2008   bilateral  . TUBAL LIGATION    . WEDGE RESECTION  2002   lung cancer     OB History   No obstetric history on file.     Family History  Problem Relation Age of Onset  . Arthritis Mother   . Hearing loss Mother   . Hyperlipidemia Mother   . Hypertension Mother   . Hypertension Father   . Diabetes Mellitus II Father   . Heart disease Father   . Arthritis Father   . Cancer Father        Brain  . COPD Father   . Diabetes Father   . Hyperlipidemia Father     . Early death Sister        Aneroxia/Bulimic  . Depression Brother   . Early death Investment banker, corporate  . Depression Daughter   . Drug abuse Daughter   . Heart disease Daughter   . Hypertension Daughter   . Stroke Maternal Grandmother   . Hypertension Maternal Grandmother   . Arthritis Maternal Grandfather   . Heart attack Maternal Grandfather   . Hearing loss Maternal Grandfather   . Colon cancer Neg Hx   . Esophageal cancer Neg Hx   . Rectal cancer Neg Hx     Social History   Tobacco Use  . Smoking status: Never Smoker  . Smokeless tobacco: Never Used  Substance Use Topics  . Alcohol use: Yes    Alcohol/week: 1.0 standard drinks    Types: 1 Glasses of wine per week    Comment: Moderate  . Drug use: No    Home Medications Prior to Admission medications   Medication Sig Start Date End Date Taking? Authorizing Provider  atorvastatin (LIPITOR) 10 MG tablet Take 10 mg by mouth daily.    [provider]  carbamazepine (TEGRETOL) 100 MG chewable tablet Chew 1 tablet (100 mg total) by mouth at bedtime. 11/04/19   Cottle, Billey Co., MD  cariprazine (VRAYLAR) capsule Take 1 capsule (3 mg total) by mouth daily. 11/04/19   Cottle, Billey Co., MD  cetirizine (ZYRTEC) 10 MG tablet Take 10 mg by mouth daily.    [provider]  Cholecalciferol (VITAMIN D3 PO) Take 1,000 tablets by mouth daily.    [provider]  dexmethylphenidate (FOCALIN) 10 MG tablet Take 1 tablet (10 mg total) by mouth daily after lunch. 02/03/20   Cottle, Billey Co., MD  dexmethylphenidate (FOCALIN) 10 MG tablet Take 1 tablet (10 mg total) by mouth daily. 03/02/20   Cottle, Billey Co., MD  dexmethylphenidate (FOCALIN) 10 MG tablet Take 1 tablet (10 mg total) by mouth daily after lunch. 01/06/20   Cottle, Billey Co., MD  Dexmethylphenidate HCl (FOCALIN XR) 25 MG CP24 Take 1 capsule by mouth every morning. 01/06/20   Cottle, Billey Co., MD  Dexmethylphenidate HCl (FOCALIN XR) 25 MG  CP24 Take 1 tablet by mouth in the morning. 03/02/20   Cottle, Billey Co., MD  Dexmethylphenidate HCl 25 MG CP24 Take 25 mg by mouth daily. 02/03/20   Cottle, Billey Co., MD  EQUETRO 200 MG CP12 12 hr capsule TAKE 1 CAPSULE(200 MG) BY MOUTH AT BEDTIME 01/14/20   Cottle, Billey Co., MD  ferrous gluconate (FERGON) 324 MG tablet Take 324 mg by mouth daily  with breakfast.    [provider]  lamoTRIgine (LAMICTAL) 200 MG tablet TAKE 1 TABLET(200 MG) BY MOUTH TWICE DAILY 01/01/20   Cottle, Billey Co., MD  lithium carbonate 150 MG capsule Take 1 capsule (150 mg total) by mouth daily in the afternoon. 11/04/19   Cottle, Billey Co., MD  LORazepam (ATIVAN) 0.5 MG tablet 1-2 tablets every 8 hours as needed for agitation and anxiety 01/06/20   Cottle, Billey Co., MD  meclizine (ANTIVERT) 25 MG tablet Take 1 tablet (25 mg total) by mouth 3 (three) times daily as needed for dizziness. 10/23/19   Brunetta Jeans, PA-C  Multiple Vitamin (MULTIVITAMIN) tablet Take 1 tablet by mouth daily.    [provider]  omeprazole (PRILOSEC) 20 MG capsule Take 20 mg by mouth at bedtime.    [provider]  ondansetron (ZOFRAN ODT) 4 MG disintegrating tablet Take 1 tablet (4 mg total) by mouth every 8 (eight) hours as needed for nausea or vomiting. 10/23/19   Brunetta Jeans, PA-C  pantoprazole (PROTONIX) 40 MG tablet Take 1 tablet (40 mg total) by mouth daily. 09/23/19   Brunetta Jeans, PA-C    Allergies    Azithromycin, Penicillins, and Adhesive [tape]  Review of Systems   Review of Systems All other systems are reviewed and are negative for acute change except as noted in the HPI.  Physical Exam Updated Vital Signs BP 137/87 (BP Location: Right Wrist)   Pulse 95   Temp 98.6 F (37 C) (Oral)   Resp 17   SpO2 99%   Physical Exam Vitals and nursing note reviewed.  Constitutional:      General: She is not in acute distress.    Appearance: She is not ill-appearing.  HENT:     Head:  Normocephalic.     Comments: No sinus or temporal tenderness.    Right Ear: Tympanic membrane and external ear normal.     Left Ear: Tympanic membrane and external ear normal.     Nose: Nose normal.     Mouth/Throat:     Mouth: Mucous membranes are moist.     Pharynx: Oropharynx is clear.  Eyes:     General: No scleral icterus.       Right eye: No discharge.        Left eye: No discharge.     Extraocular Movements: Extraocular movements intact.     Conjunctiva/sclera: Conjunctivae normal.     Pupils: Pupils are equal, round, and reactive to light.  Neck:     Vascular: No JVD.     Comments: No meningeal signs Cardiovascular:     Rate and Rhythm: Normal rate and regular rhythm.     Pulses: Normal pulses.          Radial pulses are 2+ on the right side and 2+ on the left side.     Heart sounds: Normal heart sounds.  Pulmonary:     Comments: Lungs clear to auscultation in all fields. Symmetric chest rise. No wheezing, rales, or rhonchi. Abdominal:     Comments: Abdomen is soft, non-distended, and non-tender in all quadrants. No rigidity, no guarding. No peritoneal signs.  Musculoskeletal:        General: Normal range of motion.     Cervical back: Normal range of motion.  Skin:    General: Skin is warm and dry.     Capillary Refill: Capillary refill takes less than 2 seconds.  Neurological:  Mental Status: She is oriented to person, place, and time.     GCS: GCS eye subscore is 4. GCS verbal subscore is 5. GCS motor subscore is 6.     Comments: Speech is clear and goal oriented, follows commands CN III-XII intact, no facial droop Normal strength in upper and lower extremities bilaterally including dorsiflexion and plantar flexion, strong and equal grip strength Sensation normal to light and sharp touch Moves extremities without ataxia, coordination intact Normal finger to nose and rapid alternating movements Normal gait and balance  Psychiatric:        Mood and Affect: Mood  is anxious and depressed. Affect is tearful.        Speech: Speech normal.        Behavior: Behavior normal.        Thought Content: Thought content includes suicidal ideation. Thought content does not include homicidal ideation. Thought content includes suicidal plan. Thought content does not include homicidal plan.        Judgment: Judgment normal.       ED Results / Procedures / Treatments   Labs (all labs ordered are listed, but only abnormal results are displayed) Labs Reviewed  COMPREHENSIVE METABOLIC PANEL - Abnormal; Notable for the following components:      Result Value   Glucose, Bld 102 (*)    All other components within normal limits  SALICYLATE LEVEL - Abnormal; Notable for the following components:   Salicylate Lvl <0.3 (*)    All other components within normal limits  ACETAMINOPHEN LEVEL - Abnormal; Notable for the following components:   Acetaminophen (Tylenol), Serum <10 (*)    All other components within normal limits  SARS CORONAVIRUS 2 BY RT PCR (HOSPITAL ORDER, [facility])  ETHANOL  CBC  RAPID URINE DRUG SCREEN, HOSP PERFORMED    EKG None  Radiology No results found.  Procedures Procedures (including critical care time)  Medications Ordered in ED Medications  acetaminophen (TYLENOL) tablet 650 mg (has no administration in time range)    ED Course  I have reviewed the triage vital signs and the nursing notes.  Pertinent labs & imaging results that were available during my care of the patient were reviewed by me and considered in my medical decision making (see chart for details).    MDM Rules/Calculators/A&P                      Patient presents with chief complaint of suicidal ideations. She admits to headache.  Presentation is like pts typical HA and non concerning for [facility], ICH, Meningitis, or temporal arteritis. Pt is afebrile with no focal neuro deficits, nuchal rigidity, or change in vision. Medical clearance  labs ordered.    Labs ordered and reviewed. They are unremarkable. Pt is medically cleared for TTS evaluation.  [facility] is recommending inpatient treatment.  Patient is here voluntarily.  Home medications and diet ordered.  Patient updated on process of placement and is agreeable with plan of care.  Will sign out to default provider pending placement.   History provided by patient with additional history obtained from chart review.      Final Clinical Impression(s) / ED Diagnoses Final diagnoses:  Suicidal ideation    Rx / DC Orders ED Discharge Orders    None       Cherre Robins, PA-C 01/19/20 [place], Wenda Overland, MD 01/19/20 1443

## 2020-01-20 ENCOUNTER — Encounter (HOSPITAL_COMMUNITY): Payer: Self-pay | Admitting: Registered Nurse

## 2020-01-20 ENCOUNTER — Emergency Department (HOSPITAL_COMMUNITY): Payer: BC Managed Care – PPO

## 2020-01-20 DIAGNOSIS — F314 Bipolar disorder, current episode depressed, severe, without psychotic features: Secondary | ICD-10-CM | POA: Diagnosis not present

## 2020-01-20 DIAGNOSIS — R45851 Suicidal ideations: Secondary | ICD-10-CM | POA: Diagnosis not present

## 2020-01-20 DIAGNOSIS — K449 Diaphragmatic hernia without obstruction or gangrene: Secondary | ICD-10-CM | POA: Diagnosis not present

## 2020-01-20 HISTORY — DX: Suicidal ideations: R45.851

## 2020-01-20 LAB — URINALYSIS, ROUTINE W REFLEX MICROSCOPIC
Bilirubin Urine: NEGATIVE
Glucose, UA: NEGATIVE mg/dL
Hgb urine dipstick: NEGATIVE
Ketones, ur: NEGATIVE mg/dL
Leukocytes,Ua: NEGATIVE
Nitrite: NEGATIVE
Protein, ur: NEGATIVE mg/dL
Specific Gravity, Urine: 1.026 (ref 1.005–1.030)
pH: 5 (ref 5.0–8.0)

## 2020-01-20 NOTE — Telephone Encounter (Signed)
Received call from after hours emergency line Sunday morning at 10:20 regarding patient having suicidal thoughts with a pan to overdose. Called and spoke with patient and was advised by patient that she had her pills out on the counter and was planning to overdose on them. Advised patient that provider was calling 911 emergency response team. Patient not wanting the EMS called and asked if her husband could transport her. Patient husband at church and we were able to get him on the phone to come back and take her to the emergency room for evaluation and treatment. Stayed on phone with patient after husband contacted and returned home to transport patient to [place]. Spoke with husband and he agreed to transport patient.

## 2020-01-20 NOTE — Progress Notes (Signed)
CSW received a call from [place] at [facility] stating the patient has been offered a bed and has been accepted and that the pt can arrive on 01/21/20.  The pt's accepting doctor is Dr. Alonna Minium.  The room number will be 427-B.  The number for report is [phone].  CSW will update RN and EDP.  Alphonse Guild. Catelynn Sparger, Latanya Presser, LCAS Clinical Social Worker Ph: [phone]

## 2020-01-20 NOTE — BH Assessment (Signed)
[facility] Assessment Progress Note  Per Hampton Abbot, MD, this voluntary pt requires psychiatric hospitalization at this time.  The following facilities have been contacted to seek placement for this pt, with results as noted:  Beds available, information sent, decision pending: Ronnette Juniper, Post Coordinator [phone]

## 2020-01-20 NOTE — ED Notes (Signed)
Focalin is not available at [facility] and has not be given. Will give when it arrives from [facility].

## 2020-01-20 NOTE — ED Notes (Addendum)
Pt continues to be calm and cooperative.

## 2020-01-20 NOTE — ED Notes (Signed)
Pt is calm, cooperative, pleasant, able to complete ADLS.

## 2020-01-20 NOTE — Progress Notes (Signed)
CSW received a call from [place] at [facility] stating pt needs a voluntary form signed and faxed back to: [phone].  CSW to receive this form via fax now.  CSW will continue to follow for D/C needs.  Alphonse Guild. Nikos Anglemyer  MSW, LCSW, LCAS, CSI Transitions of Care Clinical Social Worker Care Coordination Department Ph: [phone]

## 2020-01-20 NOTE — Consult Note (Signed)
[facility] ED Progress Note  01/20/2020 4:16 PM Megan Whitney  MRN:  [id number]   Subjective:  Megan Whitney, 64 y.o., female patient seen via tele psych by this provider, Dr. Dwyane Dee; and chart reviewed on 01/20/20.  On evaluation Megan Whitney reports that she continues to be suicidal and unable to contract for safety.  Patient husband is at bedside and also feels that patient needs inpatient treatment that patient is not safe to return home at this time.  Discussed PHP and IOP programs and patient states would be interested after inpatient treatment.  Patient informed would start treatment in ED with medication management and if felt better within the next 24 to 48 hours would send home in [facility] program. Informed that seeking gero psychiatric placement and it is undetermined how long it would take for an available bed; but that she may start to feel better before bed is available but would make sure she was set up with appropriate services and safety plan.  Patient and husband in agreement.  During evaluation Megan Whitney is alert/oriented x 4; calm/cooperative; and mood is congruent with affect.  She does not appear to be responding to internal/external stimuli or delusional thoughts.  Patient denies homicidal ideation, psychosis, and paranoia; but continues to endorse suicidal ideation with plan.  Patient answered question appropriately.     Principal Problem: Bipolar disorder current episode depressed ([place]) Diagnosis:  Principal Problem:   Bipolar disorder current episode depressed ([place]) Active Problems:   Suicidal ideation  Total Time spent with patient: 30 minutes  Past Psychiatric History: See above  Past Medical History:  Past Medical History:  Diagnosis Date  . Allergy    Seasonal  . Anemia    History of GI blood loss  . Anxiety   . Arthritis   . Bipolar 1 disorder ([place])   . Colon polyps   . Depression   . Epistaxis    Around 2011 or 2012, required cauterization.    . Esophageal stricture   . GERD (gastroesophageal reflux disease)   . Headache(784.0)   . Hyperlipidemia   . Interstitial cystitis   . Lung cancer ([place]) 2002  . Obesity   . Sleep apnea    Doesn't use a CPAP    Past Surgical History:  Procedure Laterality Date  . BALLOON DILATION  05/16/2012   Procedure: BALLOON DILATION;  Surgeon: Inda Castle, MD;  Location: [place];  Service: Endoscopy;  Laterality: N/A;  . COLONOSCOPY    . ENTEROSCOPY  05/16/2012   Procedure: ENTEROSCOPY;  Surgeon: Inda Castle, MD;  Location: [place];  Service: Endoscopy;  Laterality: N/A;  . JOINT REPLACEMENT    . right shoulder durgery 25 yrs ago    . TOTAL HIP ARTHROPLASTY  2006, 2008   bilateral  . TUBAL LIGATION    . WEDGE RESECTION  2002   lung cancer   Family History:  Family History  Problem Relation Age of Onset  . Arthritis Mother   . Hearing loss Mother   . Hyperlipidemia Mother   . Hypertension Mother   . Hypertension Father   . Diabetes Mellitus II Father   . Heart disease Father   . Arthritis Father   . Cancer Father        Brain  . COPD Father   . Diabetes Father   . Hyperlipidemia Father   . Early death Sister        Aneroxia/Bulimic  . Depression Brother   .  Early death Investment banker, corporate  . Depression Daughter   . Drug abuse Daughter   . Heart disease Daughter   . Hypertension Daughter   . Stroke Maternal Grandmother   . Hypertension Maternal Grandmother   . Arthritis Maternal Grandfather   . Heart attack Maternal Grandfather   . Hearing loss Maternal Grandfather   . Colon cancer Neg Hx   . Esophageal cancer Neg Hx   . Rectal cancer Neg Hx    Family Psychiatric  History: See above Social History:  Social History   Substance and Sexual Activity  Alcohol Use Yes  . Alcohol/week: 1.0 standard drinks  . Types: 1 Glasses of wine per week   Comment: Moderate     Social History   Substance and Sexual Activity  Drug Use No    Social  History   Socioeconomic History  . Marital status: Married    Spouse name: Not on file  . Number of children: 1  . Years of education: Not on file  . Highest education level: Not on file  Occupational History  . Occupation: Admin. assistant  Tobacco Use  . Smoking status: Never Smoker  . Smokeless tobacco: Never Used  Substance and Sexual Activity  . Alcohol use: Yes    Alcohol/week: 1.0 standard drinks    Types: 1 Glasses of wine per week    Comment: Moderate  . Drug use: No  . Sexual activity: Yes  Other Topics Concern  . Not on file  Social History Narrative   Pt lives in [place] with husband Lanny Hurst.  Followed by Dr. Clovis Pu for psychiatry and Rinaldo Cloud for therapy.   Social Determinants of Health   Financial Resource Strain:   . Difficulty of Paying Living Expenses:   Food Insecurity:   . Worried About Charity fundraiser in the Last Year:   . Arboriculturist in the Last Year:   Transportation Needs:   . Film/video editor (Medical):   Marland Kitchen Lack of Transportation (Non-Medical):   Physical Activity:   . Days of Exercise per Week:   . Minutes of Exercise per Session:   Stress:   . Feeling of Stress :   Social Connections:   . Frequency of Communication with Friends and Family:   . Frequency of Social Gatherings with Friends and Family:   . Attends Religious Services:   . Active Member of Clubs or Organizations:   . Attends Archivist Meetings:   Marland Kitchen Marital Status:     Sleep: Fair  Appetite:  Good  Current Medications: Current Facility-Administered Medications  Medication Dose Route Frequency Provider Last Rate Last Admin  . acetaminophen (TYLENOL) tablet 325-650 mg  325-650 mg Oral Q6H PRN Albrizze, Kaitlyn E, PA-C   650 mg at 01/19/20 2231  . atorvastatin (LIPITOR) tablet 10 mg  10 mg Oral Daily Albrizze, Kaitlyn E, PA-C   10 mg at 01/20/20 0916  . carbamazepine (EQUETRO) 12 hr capsule 200 mg  200 mg Oral QHS Albrizze, Kaitlyn E, PA-C   200 mg  at 01/19/20 2233  . carbamazepine (TEGRETOL) chewable tablet 100 mg  100 mg Oral QHS Albrizze, Kaitlyn E, PA-C   100 mg at 01/19/20 2229  . cariprazine (VRAYLAR) capsule 3 mg  3 mg Oral Daily Albrizze, Kaitlyn E, PA-C   3 mg at 01/20/20 0916  . cholecalciferol (VITAMIN D) tablet 1,000 Units  1,000 Units Oral Daily Albrizze, Harley Hallmark, PA-C  1,000 Units at 01/20/20 0917  . dexmethylphenidate (FOCALIN XR) 24 hr capsule 20 mg  20 mg Oral BH-[id number] Albrizze, Kaitlyn E, PA-C      . dexmethylphenidate (FOCALIN XR) 24 hr capsule 5 mg  5 mg Oral [address], [place]      . dexmethylphenidate (FOCALIN) tablet 10 mg  10 mg Oral QPC lunch Albrizze, Kaitlyn E, PA-C   10 mg at 01/20/20 1402  . ferrous gluconate (FERGON) tablet 324 mg  324 mg Oral Q breakfast Albrizze, Kaitlyn E, PA-C   324 mg at 01/20/20 0916  . FLUoxetine (PROZAC) capsule 20 mg  20 mg Oral Daily Albrizze, Kaitlyn E, PA-C   20 mg at 01/20/20 0916  . lamoTRIgine (LAMICTAL) tablet 200 mg  200 mg Oral BID Albrizze, Kaitlyn E, PA-C   200 mg at 01/20/20 0917  . lithium carbonate capsule 150 mg  150 mg Oral Q24H Albrizze, Kaitlyn E, PA-C   150 mg at 01/19/20 1555  . LORazepam (ATIVAN) tablet 0.5-1 mg  0.5-1 mg Oral Q8H PRN Albrizze, Kaitlyn E, PA-C   1 mg at 01/19/20 1554  . pantoprazole (PROTONIX) EC tablet 40 mg  40 mg Oral Daily Albrizze, Kaitlyn E, PA-C   40 mg at 01/20/20 1025   Current Outpatient Medications  Medication Sig Dispense Refill  . acetaminophen (TYLENOL) 325 MG tablet Take 325-650 mg by mouth every 6 (six) hours as needed for mild pain, moderate pain or headache.    Marland Kitchen atorvastatin (LIPITOR) 10 MG tablet Take 10 mg by mouth daily.    . carbamazepine (TEGRETOL) 100 MG chewable tablet Chew 1 tablet (100 mg total) by mouth at bedtime. 90 tablet 1  . cariprazine (VRAYLAR) capsule Take 1 capsule (3 mg total) by mouth daily. 30 capsule 2  . cetirizine (ZYRTEC) 10 MG tablet Take 10 mg by mouth daily.    . cholecalciferol (VITAMIN D)  25 MCG (1000 UNIT) tablet Take 1,000 Units by mouth daily.    Marland Kitchen dexmethylphenidate (FOCALIN) 10 MG tablet Take 1 tablet (10 mg total) by mouth daily after lunch. 30 tablet 0  . Dexmethylphenidate HCl (FOCALIN XR) 25 MG CP24 Take 1 capsule by mouth every morning. 30 capsule 0  . EQUETRO 200 MG CP12 12 hr capsule TAKE 1 CAPSULE(200 MG) BY MOUTH AT BEDTIME (Patient taking differently: Take 200 mg by mouth at bedtime. ) 30 capsule 1  . ferrous gluconate (FERGON) 324 MG tablet Take 324 mg by mouth daily with breakfast.    . FLUoxetine (PROZAC) 20 MG capsule Take 20 mg by mouth daily.    Marland Kitchen lamoTRIgine (LAMICTAL) 200 MG tablet TAKE 1 TABLET(200 MG) BY MOUTH TWICE DAILY (Patient taking differently: Take 200 mg by mouth in the morning and at bedtime. ) 60 tablet 2  . lithium carbonate 150 MG capsule Take 1 capsule (150 mg total) by mouth daily in the afternoon. 90 capsule 1  . LORazepam (ATIVAN) 0.5 MG tablet 1-2 tablets every 8 hours as needed for agitation and anxiety (Patient taking differently: Take 0.5-1 mg by mouth every 8 (eight) hours as needed for anxiety (agitation). ) 50 tablet 0  . pantoprazole (PROTONIX) 40 MG tablet Take 1 tablet (40 mg total) by mouth daily. 90 tablet 0  . [START ON 02/03/2020] dexmethylphenidate (FOCALIN) 10 MG tablet Take 1 tablet (10 mg total) by mouth daily after lunch. (Patient not taking: Reported on 01/19/2020) 30 tablet 0  . [START ON 03/02/2020] dexmethylphenidate (FOCALIN) 10 MG tablet Take 1  tablet (10 mg total) by mouth daily. (Patient not taking: Reported on 01/19/2020) 30 tablet 0  . [START ON 03/02/2020] Dexmethylphenidate HCl (FOCALIN XR) 25 MG CP24 Take 1 tablet by mouth in the morning. (Patient not taking: Reported on 01/19/2020) 30 capsule 0  . [START ON 02/03/2020] Dexmethylphenidate HCl 25 MG CP24 Take 25 mg by mouth daily. (Patient not taking: Reported on 01/19/2020) 30 capsule 0  . meclizine (ANTIVERT) 25 MG tablet Take 1 tablet (25 mg total) by mouth 3 (three)  times daily as needed for dizziness. (Patient not taking: Reported on 01/19/2020) 30 tablet 0  . ondansetron (ZOFRAN ODT) 4 MG disintegrating tablet Take 1 tablet (4 mg total) by mouth every 8 (eight) hours as needed for nausea or vomiting. (Patient not taking: Reported on 01/19/2020) 20 tablet 0    Lab Results:  Results for orders placed or performed during the hospital encounter of 01/19/20 (from the past 48 hour(s))  Urinalysis, Routine w reflex microscopic     Status: None   Collection Time: 01/19/20 11:29 AM  Result Value Ref Range   Color, Urine YELLOW YELLOW   APPearance CLEAR CLEAR   Specific Gravity, Urine 1.026 1.005 - 1.030   pH 5.0 5.0 - 8.0   Glucose, UA NEGATIVE NEGATIVE mg/dL   Hgb urine dipstick NEGATIVE NEGATIVE   Bilirubin Urine NEGATIVE NEGATIVE   Ketones, ur NEGATIVE NEGATIVE mg/dL   Protein, ur NEGATIVE NEGATIVE mg/dL   Nitrite NEGATIVE NEGATIVE   Leukocytes,Ua NEGATIVE NEGATIVE    Comment: Performed at [facility], [place] [address], [place], [place] [address]  Comprehensive metabolic panel     Status: Abnormal   Collection Time: 01/19/20 11:58 AM  Result Value Ref Range   Sodium 141 135 - 145 mmol/L   Potassium 4.2 3.5 - 5.1 mmol/L   Chloride 107 98 - 111 mmol/L   CO2 26 22 - 32 mmol/L   Glucose, Bld 102 (H) 70 - 99 mg/dL    Comment: Glucose reference range applies only to samples taken after fasting for at least 8 hours.   BUN 16 8 - 23 mg/dL   Creatinine, Ser 0.75 0.44 - 1.00 mg/dL   Calcium 9.2 8.9 - 10.3 mg/dL   Total Protein 6.9 6.5 - 8.1 g/dL   Albumin 3.9 3.5 - 5.0 g/dL   AST 20 15 - 41 U/L   ALT 13 0 - 44 U/L   Alkaline Phosphatase 65 38 - 126 U/L   Total Bilirubin 0.4 0.3 - 1.2 mg/dL   GFR calc non Af Amer >60 >60 mL/min   GFR calc Af Amer >60 >60 mL/min   Anion gap 8 5 - 15    Comment: Performed at [facility], [place] [address] [place], [place] [address]  Ethanol     Status: None   Collection Time: 01/19/20  11:58 AM  Result Value Ref Range   Alcohol, Ethyl (B) <10 <10 mg/dL    Comment: (NOTE) Lowest detectable limit for serum alcohol is 10 mg/dL. For medical purposes only. Performed at [facility], [place] [address], [place], [place] [address]   Salicylate level     Status: Abnormal   Collection Time: 01/19/20 11:58 AM  Result Value Ref Range   Salicylate Lvl <1.0 (L) 7.0 - 30.0 mg/dL    Comment: Performed at [facility], [place] [address] [place], [place] [address]  Acetaminophen level     Status: Abnormal   Collection Time: 01/19/20  11:58 AM  Result Value Ref Range   Acetaminophen (Tylenol), Serum <10 (L) 10 - 30 ug/mL    Comment: (NOTE) Therapeutic concentrations vary significantly. A range of 10-30 ug/mL  may be an effective concentration for many patients. However, some  are best treated at concentrations outside of this range. Acetaminophen concentrations >150 ug/mL at 4 hours after ingestion  and >50 ug/mL at 12 hours after ingestion are often associated with  toxic reactions. Performed at [facility], [place] [address] [place], [place] [address]   cbc     Status: None   Collection Time: 01/19/20 11:58 AM  Result Value Ref Range   WBC 7.4 4.0 - 10.5 K/uL   RBC 4.19 3.87 - 5.11 MIL/uL   Hemoglobin 12.7 12.0 - 15.0 g/dL   HCT 39.6 36.0 - 46.0 %   MCV 94.5 80.0 - 100.0 fL   MCH 30.3 26.0 - 34.0 pg   MCHC 32.1 30.0 - 36.0 g/dL   RDW 13.9 11.5 - 15.5 %   Platelets 250 150 - 400 K/uL   nRBC 0.0 0.0 - 0.2 %    Comment: Performed at [facility], [place] [address], [place], [place] [address]  Rapid urine drug screen (hospital performed)     Status: None   Collection Time: 01/19/20 11:58 AM  Result Value Ref Range   Opiates NONE DETECTED NONE DETECTED   Cocaine NONE DETECTED NONE DETECTED   Benzodiazepines NONE DETECTED NONE DETECTED   Amphetamines NONE DETECTED NONE DETECTED   Tetrahydrocannabinol NONE  DETECTED NONE DETECTED   Barbiturates NONE DETECTED NONE DETECTED    Comment: (NOTE) DRUG SCREEN FOR MEDICAL PURPOSES ONLY.  IF CONFIRMATION IS NEEDED FOR ANY PURPOSE, NOTIFY LAB WITHIN 5 DAYS. LOWEST DETECTABLE LIMITS FOR URINE DRUG SCREEN Drug Class                     Cutoff (ng/mL) Amphetamine and metabolites    1000 Barbiturate and metabolites    200 Benzodiazepine                 650 Tricyclics and metabolites     300 Opiates and metabolites        300 Cocaine and metabolites        300 THC                            50 Performed at [facility], [place] [address], [place], [place] [address]   SARS Coronavirus 2 by RT PCR (hospital order, performed in [facility] hospital lab) Nasopharyngeal Nasopharyngeal Swab     Status: None   Collection Time: 01/19/20  1:21 PM   Specimen: Nasopharyngeal Swab  Result Value Ref Range   SARS Coronavirus 2 NEGATIVE NEGATIVE    Comment: (NOTE) SARS-CoV-2 target nucleic acids are NOT DETECTED. The SARS-CoV-2 RNA is generally detectable in upper and lower respiratory specimens during the acute phase of infection. The lowest concentration of SARS-CoV-2 viral copies this assay can detect is 250 copies / mL. A negative result does not preclude SARS-CoV-2 infection and should not be used as the sole basis for treatment or other patient management decisions.  A negative result may occur with improper specimen collection / handling, submission of specimen other than nasopharyngeal swab, presence of viral mutation(s) within the areas targeted by this assay, and inadequate number of viral copies (<250 copies / mL). A negative result must  be combined with clinical observations, patient history, and epidemiological information. Fact Sheet for Patients:   StrictlyIdeas.no Fact Sheet for Healthcare Providers: BankingDealers.co.za This test is not yet approved or cleared  by the [place] FDA and has been authorized for detection and/or diagnosis of SARS-CoV-2 by FDA under an Emergency Use Authorization (EUA).  This EUA will remain in effect (meaning this test can be used) for the duration of the COVID-19 declaration under Section 564(b)(1) of the Act, 21 U.S.C. section 360bbb-3(b)(1), unless the authorization is terminated or revoked sooner. Performed at [facility], [place] [address] [place], [place] [address]     Blood Alcohol level:  Lab Results  Component Value Date   ETH <10 01/19/2020    Physical Findings: AIMS:  , ,  ,  ,    CIWA:    COWS:     Musculoskeletal: Strength & Muscle Tone: within normal limits Gait & Station: normal Patient leans: N/A  Psychiatric Specialty Exam: Physical Exam Vitals and nursing note reviewed. Exam conducted with a chaperone present.  Pulmonary:     Effort: Pulmonary effort is normal.  Neurological:     Mental Status: She is alert.  Psychiatric:        Attention and Perception: Attention and perception normal.        Mood and Affect: Affect normal. Mood is depressed.        Speech: Speech normal.        Behavior: Behavior normal. Behavior is cooperative.        Thought Content: Thought content is not paranoid or delusional. Thought content includes suicidal ideation. Thought content does not include homicidal ideation. Thought content includes suicidal plan.        Cognition and Memory: Cognition and memory normal.        Judgment: Judgment is impulsive.     Review of Systems  Psychiatric/Behavioral: Positive for suicidal ideas. Negative for behavioral problems, confusion and hallucinations. The patient is nervous/anxious.   All other systems reviewed and are negative.   Blood pressure (!) 144/94, pulse 78, temperature 98.6 F (37 C), temperature source Oral, resp. rate 16, SpO2 98 %.There is no height or weight on file to calculate BMI.  General Appearance: Casual  Eye Contact:  Good   Speech:  Clear and Coherent and Normal Rate  Volume:  Normal  Mood:  Anxious and Depressed  Affect:  Depressed  Thought Process:  Coherent, Goal Directed and Descriptions of Associations: Intact  Orientation:  Full (Time, Place, and Person)  Thought Content:  Logical  Suicidal Thoughts:  Yes.  with intent/plan  Homicidal Thoughts:  No  Memory:  Immediate;   Good Recent;   Good  Judgement:  Impaired  Insight:  Lacking  Psychomotor Activity:  Normal  Concentration:  Concentration: Good and Attention Span: Good  Recall:  Good  Fund of Knowledge:  Good  Language:  Good  Akathisia:  No  Handed:  Right  AIMS (if indicated):     Assets:  Communication Skills Desire for Improvement Housing Resilience Social Support Transportation  ADL's:  Intact  Cognition:  WNL  Sleep:      Treatment Plan Summary: Daily contact with patient to assess and evaluate symptoms and progress in treatment, Medication management and Plan Inpatient psychiatric treatment   Disposition: Recommend psychiatric Inpatient admission when medically cleared.   Azariah Latendresse, NP 01/20/2020, 4:16 PM

## 2020-01-21 ENCOUNTER — Ambulatory Visit: Payer: BC Managed Care – PPO | Admitting: Psychiatry

## 2020-01-21 ENCOUNTER — Telehealth: Payer: Self-pay | Admitting: Psychiatry

## 2020-01-21 DIAGNOSIS — F315 Bipolar disorder, current episode depressed, severe, with psychotic features: Secondary | ICD-10-CM | POA: Diagnosis not present

## 2020-01-21 DIAGNOSIS — K219 Gastro-esophageal reflux disease without esophagitis: Secondary | ICD-10-CM | POA: Diagnosis not present

## 2020-01-21 DIAGNOSIS — Z87892 Personal history of anaphylaxis: Secondary | ICD-10-CM | POA: Diagnosis not present

## 2020-01-21 DIAGNOSIS — D649 Anemia, unspecified: Secondary | ICD-10-CM | POA: Diagnosis not present

## 2020-01-21 DIAGNOSIS — F319 Bipolar disorder, unspecified: Secondary | ICD-10-CM | POA: Diagnosis not present

## 2020-01-21 DIAGNOSIS — Z85118 Personal history of other malignant neoplasm of bronchus and lung: Secondary | ICD-10-CM | POA: Diagnosis not present

## 2020-01-21 DIAGNOSIS — Z79899 Other long term (current) drug therapy: Secondary | ICD-10-CM | POA: Diagnosis not present

## 2020-01-21 DIAGNOSIS — R45851 Suicidal ideations: Secondary | ICD-10-CM | POA: Diagnosis not present

## 2020-01-21 DIAGNOSIS — Z6835 Body mass index (BMI) 35.0-35.9, adult: Secondary | ICD-10-CM | POA: Diagnosis not present

## 2020-01-21 DIAGNOSIS — E785 Hyperlipidemia, unspecified: Secondary | ICD-10-CM | POA: Diagnosis not present

## 2020-01-21 DIAGNOSIS — M1990 Unspecified osteoarthritis, unspecified site: Secondary | ICD-10-CM | POA: Diagnosis not present

## 2020-01-21 DIAGNOSIS — R519 Headache, unspecified: Secondary | ICD-10-CM | POA: Diagnosis not present

## 2020-01-21 DIAGNOSIS — N3010 Interstitial cystitis (chronic) without hematuria: Secondary | ICD-10-CM | POA: Diagnosis not present

## 2020-01-21 DIAGNOSIS — E669 Obesity, unspecified: Secondary | ICD-10-CM | POA: Diagnosis not present

## 2020-01-21 DIAGNOSIS — Z880 Allergy status to penicillin: Secondary | ICD-10-CM | POA: Diagnosis not present

## 2020-01-21 DIAGNOSIS — F329 Major depressive disorder, single episode, unspecified: Secondary | ICD-10-CM | POA: Diagnosis not present

## 2020-01-21 DIAGNOSIS — Z20822 Contact with and (suspected) exposure to covid-19: Secondary | ICD-10-CM | POA: Diagnosis not present

## 2020-01-21 DIAGNOSIS — Z86010 Personal history of colonic polyps: Secondary | ICD-10-CM | POA: Diagnosis not present

## 2020-01-21 MED ORDER — LITHIUM CARBONATE 150 MG PO CAPS
150.00 | ORAL_CAPSULE | ORAL | Status: DC
Start: 2020-01-25 — End: 2020-01-21

## 2020-01-21 MED ORDER — FERROUS SULFATE 325 (65 FE) MG PO TABS
325.00 | ORAL_TABLET | ORAL | Status: DC
Start: 2020-01-25 — End: 2020-01-21

## 2020-01-21 MED ORDER — LORATADINE 10 MG PO TABS
10.00 | ORAL_TABLET | ORAL | Status: DC
Start: 2020-01-25 — End: 2020-01-21

## 2020-01-21 MED ORDER — DEXMETHYLPHENIDATE HCL 10 MG PO TABS: 5.0000 mg | ORAL_TABLET | Freq: Every day | ORAL | 0 refills | Status: DC

## 2020-01-21 MED ORDER — PANTOPRAZOLE SODIUM 40 MG PO TBEC
40.00 | DELAYED_RELEASE_TABLET | ORAL | Status: DC
Start: 2020-01-25 — End: 2020-01-21

## 2020-01-21 MED ORDER — CARIPRAZINE HCL 3 MG PO CAPS
3.00 | ORAL_CAPSULE | ORAL | Status: DC
Start: 2020-01-25 — End: 2020-01-21

## 2020-01-21 MED ORDER — GENERIC EXTERNAL MEDICATION
Status: DC
Start: ? — End: 2020-01-21

## 2020-01-21 MED ORDER — DEXMETHYLPHENIDATE HCL ER 20 MG PO CP24
20.00 | ORAL_CAPSULE | ORAL | Status: DC
Start: 2020-01-22 — End: 2020-01-21

## 2020-01-21 MED ORDER — FLUOXETINE HCL 20 MG PO CAPS
20.00 | ORAL_CAPSULE | ORAL | Status: DC
Start: 2020-01-22 — End: 2020-01-21

## 2020-01-21 MED ORDER — POLYETHYLENE GLYCOL 3350 17 GM/SCOOP PO POWD
17.00 | ORAL | Status: DC
Start: ? — End: 2020-01-21

## 2020-01-21 MED ORDER — METHYLPHENIDATE HCL 10 MG PO TABS
10.00 | ORAL_TABLET | ORAL | Status: DC
Start: 2020-01-24 — End: 2020-01-21

## 2020-01-21 MED ORDER — DEXMETHYLPHENIDATE HCL 10 MG PO TABS: 20.0000 mg | ORAL_TABLET | Freq: Every day | ORAL | 0 refills | Status: DC

## 2020-01-21 MED ORDER — DEXMETHYLPHENIDATE HCL ER 5 MG PO CP24
5.00 | ORAL_CAPSULE | ORAL | Status: DC
Start: 2020-01-22 — End: 2020-01-21

## 2020-01-21 MED ORDER — ALUM & MAG HYDROXIDE-SIMETH 200-200-20 MG/5ML PO SUSP
15.00 | ORAL | Status: DC
Start: ? — End: 2020-01-21

## 2020-01-21 MED ORDER — CARBAMAZEPINE ER 200 MG PO CP12
200.00 | ORAL_CAPSULE | ORAL | Status: DC
Start: 2020-01-24 — End: 2020-01-21

## 2020-01-21 MED ORDER — PRAVASTATIN SODIUM 40 MG PO TABS
40.00 | ORAL_TABLET | ORAL | Status: DC
Start: 2020-01-24 — End: 2020-01-21

## 2020-01-21 MED ORDER — ACETAMINOPHEN 325 MG PO TABS
650.00 | ORAL_TABLET | ORAL | Status: DC
Start: ? — End: 2020-01-21

## 2020-01-21 MED ORDER — LAMOTRIGINE 100 MG PO TABS
200.00 | ORAL_TABLET | ORAL | Status: DC
Start: 2020-01-24 — End: 2020-01-21

## 2020-01-21 MED ORDER — CARBAMAZEPINE 100 MG PO CHEW
100.00 | CHEWABLE_TABLET | ORAL | Status: DC
Start: 2020-01-24 — End: 2020-01-21

## 2020-01-21 NOTE — ED Notes (Signed)
Pt off unit to [facility] per provider. Pt calm, cooperative, no s/s of distress. DC information set with pt for facility. Belongings given to [facility] for facility. Pt ambulatory off unit , escorted by RN. Pt transported by [facility]

## 2020-01-21 NOTE — Telephone Encounter (Signed)
Clinical pharmacist called from [facility]. Pt is admitted. They need to reconcile her medications. I gave them the med list as of last visit with [place]. But there is confusion about Equatro and carbamazapine and also the numerous mood stabilizers she is on. Please call asap.

## 2020-01-21 NOTE — Telephone Encounter (Signed)
I spoke with the clinical pharmacist on staff Dr. Earlie Server regarding Orbie Hurst medication history.  They were not able to review the psychiatric note in our system.  Gave the rationale explanation for her current med regimen.  Gave history of various psych meds and mood stabilizers she is taking in the past and the results.  Patient is manic and agitated at their unit.  Suggested a trial of loxapine as it is a good antimanic med and has lower EPS risk than many of the alternatives that are available.  Patient has failed multiple other mood stabilizers and antipsychotics.  She is not likely to tolerate an increase in carbamazepine because of history of dizziness and falls.

## 2020-01-21 NOTE — ED Notes (Signed)
Called [facility] to give report or set up time for admission  , they were in a meeting , they are going to call back.

## 2020-01-22 ENCOUNTER — Ambulatory Visit: Payer: BC Managed Care – PPO | Admitting: Psychiatry

## 2020-01-23 DIAGNOSIS — F319 Bipolar disorder, unspecified: Secondary | ICD-10-CM | POA: Diagnosis not present

## 2020-01-23 DIAGNOSIS — K219 Gastro-esophageal reflux disease without esophagitis: Secondary | ICD-10-CM | POA: Diagnosis not present

## 2020-01-23 DIAGNOSIS — D649 Anemia, unspecified: Secondary | ICD-10-CM | POA: Diagnosis not present

## 2020-01-23 DIAGNOSIS — M1990 Unspecified osteoarthritis, unspecified site: Secondary | ICD-10-CM | POA: Diagnosis not present

## 2020-01-24 ENCOUNTER — Other Ambulatory Visit: Payer: Self-pay | Admitting: Psychiatry

## 2020-01-24 ENCOUNTER — Ambulatory Visit: Payer: BC Managed Care – PPO | Admitting: Psychiatry

## 2020-01-24 ENCOUNTER — Telehealth: Payer: Self-pay | Admitting: Psychiatry

## 2020-01-24 MED ORDER — GENERIC EXTERNAL MEDICATION
Status: DC
Start: ? — End: 2020-01-24

## 2020-01-24 MED ORDER — METHYLPHENIDATE HCL 10 MG PO TABS
20.00 | ORAL_TABLET | ORAL | Status: DC
Start: 2020-01-25 — End: 2020-01-24

## 2020-01-24 MED ORDER — LOXAPINE SUCCINATE 10 MG PO CAPS
10.00 | ORAL_CAPSULE | ORAL | Status: DC
Start: 2020-01-24 — End: 2020-01-24

## 2020-01-24 MED ORDER — LOXAPINE SUCCINATE 5 MG PO CAPS: 10.0000 mg | ORAL_CAPSULE | Freq: Every day | ORAL | 1 refills | Status: DC

## 2020-01-24 NOTE — Telephone Encounter (Signed)
Pt was just D/C from [facility]. They put her on Loxapine 10mg  nightly to take and gave her an RX. She can't find it anywhere. Did check [facility] and they have it in the 5mg . If we can resend a new RX to them for Loxapine 5mg  2 nightly they can fill it for her. [place].

## 2020-01-24 NOTE — Telephone Encounter (Signed)
Sent rx.

## 2020-01-27 ENCOUNTER — Other Ambulatory Visit: Payer: Self-pay

## 2020-01-27 ENCOUNTER — Ambulatory Visit (INDEPENDENT_AMBULATORY_CARE_PROVIDER_SITE_OTHER): Payer: BC Managed Care – PPO | Admitting: Psychiatry

## 2020-01-27 DIAGNOSIS — F3110 Bipolar disorder, current episode manic without psychotic features, unspecified: Secondary | ICD-10-CM | POA: Diagnosis not present

## 2020-01-27 NOTE — Progress Notes (Signed)
[facility] Counselor/Therapist Progress Note  Patient ID: Megan Whitney, MRN: [id number],    Date: 01/27/2020  Time Spent: 60 minutes   10:05am to 11:05am  Treatment Type: Individual Therapy  Reported Symptoms: anxiety, depression  Mental Status Exam:  Appearance:   Neat     Behavior:  Appropriate and Sharing  Motor:  Normal  Speech/Language:   Normal Rate  Affect:  anxious, depressed, some tearfulness initially  Mood:  anxious and depressed  Thought process:  goal directed  Thought content:    WNL  Sensory/Perceptual disturbances:    WNL  Orientation:  oriented to person, place, time/date, situation, day of week, month of year and year  Attention:  Fair  Concentration:  Fair  Memory:  some forgetfulness  Fund of knowledge:   Good  Insight:    Good and Fair  Judgment:   Fair at times; "sometimes Good"  Impulse Control:  Good and Fair   Risk Assessment: Danger to Self:  No Self-injurious Behavior: No Danger to Others: No Duty to Warn:no Physical Aggression / Violence:No  Access to Firearms a concern: No  Gang Involvement:No   Subjective:  Patient in today after being discharged form hospital due to [place]. Denies any SI currently.  Interventions: Cognitive Behavioral Therapy and Ego-Supportive  Diagnosis:   ICD-10-CM   1. Bipolar I disorder, most recent episode (or current) manic ([place])  F31.10     Plan: Patient not signing tx plan on computer screen due to [place].  Treatment Goals: Goals remain on plan as patient works on strategies to meet her goals. Progress is noted each visit in "Progress" section of goal plan.  Long Term Goal: Reduce overall level, frequency, and intensity of the anxiety so that daily functioning is not impaired.  Short Term Goal: 1.Increase understanding of the beliefs and messages that produce the worry and anxiety.  Strategies: 1.Help client develop reality-based positive cognitive messages. 2. Develop a "coping card"  or other reminder which coping strategies are recorded for patient's later use .  PROGRESS: Patient in today with anxiety and depression.  "Had a rough week earlier and was set off by a call from her daughter on Mother's Day.  Daughter was insensitive and patient was tearful and struggled with SI, ended up calling suicide  hotline but was put on hold for them to find someone to speak with her." Did reach someone else on call and end result is patient did get admitted to psych unit in [place] and kept 4 days. Did not do anything to harm self. Denies any SI today and is calmer and reflective. Realizing how impulsive her thoughts have been but also did not harm herself. Doing some chair exercises and walking some. Has been in touch with a couple friends and looking forward to vacation next week with daughter-in-law, husband and grand-daughter. Did focus some today on not responding impulsively to stress or situations with other people that are hurt [place].  Reflecting some back on past week, feeling grateful that she called for help and did not harm self, felt safe and secure in hospital. States she needs to get more rest right now, keep walking some, and continue coloring adult coloring books as that helps calm me.  Does seem more grounded now, promising not to harm self in any way and adds that she is not having any SI.    Goal review and progress/challenges noted with patient.  Next appt within 1-2 weeks.   Shanon Ace,  LCSW

## 2020-01-31 ENCOUNTER — Ambulatory Visit: Payer: BC Managed Care – PPO | Admitting: Podiatry

## 2020-01-31 ENCOUNTER — Other Ambulatory Visit: Payer: Self-pay | Admitting: Psychiatry

## 2020-01-31 DIAGNOSIS — F319 Bipolar disorder, unspecified: Secondary | ICD-10-CM

## 2020-01-31 DIAGNOSIS — F411 Generalized anxiety disorder: Secondary | ICD-10-CM

## 2020-02-10 ENCOUNTER — Other Ambulatory Visit: Payer: Self-pay

## 2020-02-10 ENCOUNTER — Telehealth: Payer: Self-pay | Admitting: Psychiatry

## 2020-02-10 ENCOUNTER — Ambulatory Visit (INDEPENDENT_AMBULATORY_CARE_PROVIDER_SITE_OTHER): Payer: BC Managed Care – PPO | Admitting: Psychiatry

## 2020-02-10 DIAGNOSIS — F3110 Bipolar disorder, current episode manic without psychotic features, unspecified: Secondary | ICD-10-CM | POA: Diagnosis not present

## 2020-02-10 NOTE — Telephone Encounter (Signed)
Please find out her current med list.  It looks like the hospital change things around a lot and I am not sure what she is taking.  We will also try to get her in on an earlier appointment.

## 2020-02-10 NOTE — Telephone Encounter (Signed)
Put her on cancellation list for appointment as soon as possible.

## 2020-02-10 NOTE — Progress Notes (Signed)
      [facility] Counselor/Therapist Progress Note  Patient ID: Megan Whitney, MRN: [id number],    Date: 02/10/2020  Time Spent: 60 minutes  8:00am to 9:00am  Treatment Type: Individual Therapy  Reported Symptoms: anxiety, depression  Mental Status Exam:  Appearance:   Neat     Behavior:  Appropriate and Sharing  Motor:  Normal  Speech/Language:   Clear and Coherent  Affect:  anxious  Mood:  anxious and depressed  Thought process:  goal directed  Thought content:    WNL  Sensory/Perceptual disturbances:    WNL  Orientation:  oriented to person, place, time/date, situation, day of week, month of year and year  Attention:  Good/Fair  Concentration:  Good and Fair  Memory:  some forgetfulness and worse under stress  Fund of knowledge:   Good  Insight:    Good and Fair  Judgment:   Good and Fair  Impulse Control:  Good   Risk Assessment: Danger to Self:  No Self-injurious Behavior: No Danger to Others: No Duty to Warn:no Physical Aggression / Violence:No  Access to Firearms a concern: No  Gang Involvement:No   Subjective: Patient today reports anxiety and depression, just came off good vacation week and dreading return "regular life and work."  Interventions: Public relations account executive Therapy, Solution-Oriented/Positive Psychology and Ego-Supportive  Diagnosis:   ICD-10-CM   1. Bipolar I disorder, most recent episode (or current) manic ([place])  F31.10      Plan: Patient not signing tx plan on computer screen due to [place].  Treatment Goals: Goals remain on plan as patient works on strategies to meet her goals. Progress is noted each visit in "Progress" section of goal plan.  Long Term Goal: Reduce overall level, frequency, and intensity of the anxiety so that daily functioning is not impaired.  Short Term Goal: 1.Increase understanding of the beliefs and messages that produce the worry and anxiety.  Strategies: 1.Help client develop reality-based positive  cognitive messages. 2. Develop a "coping card" or other reminder which coping strategies are recorded for patient's later use .  PROGRESS: Patient in today reporting anxiety and depression.  Last week not as bad while she was on vacation, and didn't want to come home. Some tearfulness this a.m. thinking about returning to work. Stated since returning home 2 days ago, "I've been more emotional, some anger, irritability, some forgetfulness, fear of failure at work. Doubts herself.  Discussed in more detail her fear of failure at work and her spoken goal of "perfection". Challenged the perfection goal as it sets her up for failure since it's not possible.  Patient participated well and took notes to take with her as she re-enters work today.  (Does plan to retire 04/03/2020.) Worked more on goal above, increasing her understanding of the beliefs and anxious self-talk that feeds her anxiety and worry, and especially trying to catch them earlier and limit their influence.  Seems more grounded by session end.   Goal review and progress/challenges/efforts noted with patient.  Next appt within 2 weeks.   Shanon Ace, LCSW

## 2020-02-10 NOTE — Telephone Encounter (Signed)
Schedule her for Friday at 1130.

## 2020-02-10 NOTE — Telephone Encounter (Signed)
Increase loxapine to 2 capsules or 20 mg nightly and take it at least 60 minutes ahead of bedtime.

## 2020-02-10 NOTE — Telephone Encounter (Signed)
Patient given instructions and verbalized understanding. Advised her we would update a new Rx after her visit on Friday to make sure effective. She agreed.

## 2020-02-10 NOTE — Telephone Encounter (Signed)
Patient was in the office today and said that for weeks now she is experiencing insomnia and needs to know what to do. She desperately needs sleep. Please contact her at [phone]

## 2020-02-12 ENCOUNTER — Other Ambulatory Visit: Payer: Self-pay

## 2020-02-12 ENCOUNTER — Telehealth: Payer: Self-pay | Admitting: Psychiatry

## 2020-02-12 MED ORDER — LOXAPINE SUCCINATE 10 MG PO CAPS: 20.0000 mg | ORAL_CAPSULE | Freq: Every day | ORAL | 0 refills | Status: DC

## 2020-02-12 NOTE — Telephone Encounter (Signed)
Pt called and wants to discuss getting a new rx for Loxapine 20mg  pm.

## 2020-02-12 NOTE — Telephone Encounter (Signed)
Rx updated with new dose and strength

## 2020-02-14 ENCOUNTER — Other Ambulatory Visit: Payer: Self-pay

## 2020-02-14 ENCOUNTER — Encounter: Payer: Self-pay | Admitting: Psychiatry

## 2020-02-14 ENCOUNTER — Ambulatory Visit (INDEPENDENT_AMBULATORY_CARE_PROVIDER_SITE_OTHER): Payer: BC Managed Care – PPO | Admitting: Psychiatry

## 2020-02-14 DIAGNOSIS — F319 Bipolar disorder, unspecified: Secondary | ICD-10-CM | POA: Diagnosis not present

## 2020-02-14 DIAGNOSIS — F411 Generalized anxiety disorder: Secondary | ICD-10-CM | POA: Diagnosis not present

## 2020-02-14 DIAGNOSIS — F900 Attention-deficit hyperactivity disorder, predominantly inattentive type: Secondary | ICD-10-CM

## 2020-02-14 DIAGNOSIS — G3184 Mild cognitive impairment, so stated: Secondary | ICD-10-CM | POA: Diagnosis not present

## 2020-02-14 DIAGNOSIS — G251 Drug-induced tremor: Secondary | ICD-10-CM

## 2020-02-14 DIAGNOSIS — S060X0S Concussion without loss of consciousness, sequela: Secondary | ICD-10-CM

## 2020-02-14 MED ORDER — DEXMETHYLPHENIDATE HCL ER 25 MG PO CP24: 1.0000 | ORAL_CAPSULE | ORAL | 0 refills | Status: DC

## 2020-02-14 MED ORDER — LOXAPINE SUCCINATE 10 MG PO CAPS: 20.0000 mg | ORAL_CAPSULE | Freq: Every day | ORAL | 1 refills | Status: DC

## 2020-02-14 MED ORDER — LITHIUM CARBONATE 150 MG PO CAPS: 150.0000 mg | ORAL_CAPSULE | Freq: Every day | ORAL | 1 refills | Status: DC

## 2020-02-14 MED ORDER — CARIPRAZINE HCL 3 MG PO CAPS: 3.0000 mg | ORAL_CAPSULE | Freq: Every day | ORAL | 2 refills | Status: DC

## 2020-02-14 MED ORDER — DEXMETHYLPHENIDATE HCL 10 MG PO TABS: 10.0000 mg | ORAL_TABLET | Freq: Every day | ORAL | 0 refills | Status: DC

## 2020-02-14 MED ORDER — CARBAMAZEPINE ER 200 MG PO CP12: 200.0000 mg | ORAL_CAPSULE | Freq: Every day | ORAL | 0 refills | Status: DC

## 2020-02-14 MED ORDER — CARBAMAZEPINE 100 MG PO CHEW: 100.0000 mg | CHEWABLE_TABLET | Freq: Every day | ORAL | 0 refills | Status: DC

## 2020-02-14 MED ORDER — LAMOTRIGINE 200 MG PO TABS: 200.0000 mg | ORAL_TABLET | Freq: Two times a day (BID) | ORAL | 0 refills | Status: DC

## 2020-02-14 NOTE — Progress Notes (Signed)
Megan Whitney [id number] 02-09-1956 64 y.o.     Subjective:   Patient ID:  Megan Whitney is a 64 y.o. (DOB 1956/04/26) female.   Chief Complaint:  No chief complaint on file.   Depression        Associated symptoms include no decreased concentration and no suicidal ideas.  Past medical history includes anxiety.   Anxiety Symptoms include dizziness and nervous/anxious behavior. Patient reports no confusion, decreased concentration, nausea or suicidal ideas.    Medication Refill Associated symptoms include arthralgias. Pertinent negatives include no nausea or weakness.   Megan Whitney is  follow-up of r chronic mood swings and anxiety and frequent changes in medications.   At visit December 27, 2018.  Focalin XR was increased from 20 mg to 25 mg daily to help with focus and attention and potentially mood.  When seen February 13, 2019.  In an effort to reduce mood cycling we reduce fluoxetine to 20 mg daily.  At visit August 2020.  No meds were changed.  She continued the following: Focalin XR 25 mg every morning and Focalin 10 mg immediate release daily Equetro 200 mg nightly Fluoxetine 20 mg daily Lamotrigine 200 mg twice daily Lithium 150 mg nightly Vraylar 3 mg daily  She called back November 4 after seeing her therapist stating that she was having some hypomanic symptoms with reduced sleep and increased energy.  This potentiality had been discussed and the decision was made to increase Equetro from 200 mg nightly to 300 mg nightly.  seen August 12, 2019.  Because of balance problems she did not tolerate Equetro 300 mg nightly and it was changed to Equetro 200 mg nightly plus immediate release carbamazepine 100 mg nightly.  Her mood had not been stable enough on Equetro 200 mg nightly alone. Less balance problems with change in CBZ.  seen September 23, 2019.  The following was changed: For bipolar mixed increase CBZ IR to 200 mg HS.  Disc fall and balance risks.For bipolar  mixed increase CBZ IR to 200 mg HS.  Disc fall and balance risks.  She called back October 23, 2019 stating she had had another fall and felt it was due to the medication.  Therefore carbamazepine immediate release was reduced from 200 mg nightly to 100 mg nightly.  The Equetro is unchanged.  Last seen November 04, 2019.  The following was noted:  Better at the moment but balance is still somewhat of a problem.  Started PT to help balance.  Had a fall after tripping on a curb and hit her head on sidewalk.  Got a concussion with nausea and HA and dizziness and light sensitivity.  Not over it.  Concentration problems.  Has gotten back to work after a week.   Mood sx pretty good with some mild depression.  Nothing severe.  Trying to minimize stress and self care as much as possible.  No manic sx lately and sleeping fairly well.  No racing thoughts.   Working another year and plans to retire but H alcoholic and not sure it will be good to be there all the time. Seeing therapist q 2 weeks.  Therapy helping . Recent serum vitamin D level was determined to be low at 33.  The goal and chronically depressed patient's is in the 50s if possible.  So her vitamin D was increased on August 08, 2018 or thereabouts.  Checked vitamin D level again and this time it was high at 120 and so  it was stopped.  She's restarted per PCP at 1000 units daily.  01/06/2020 appointment the following is noted: Still on: Focalin XR 25 mg every morning and Focalin 10 mg immediate release daily Equetro 200 mg nightly Carbamazepine immediate release 100 mg nightly Fluoxetine 20 mg daily Lamotrigine 200 mg twice daily Lithium 150 mg nightly Vraylar 3 mg daily Not good manic.  Angry.  Missed 2 days bc sx.  Last week vacation which didn't go well.  Crying last week and missed a day.  "Pissed off at the whole world" but also depressed and hard to get OOB today.  Everything makes me angry.   Blows up without control.  Then regrets it.   Sleep irregular lately. Finished PT which might have helped some but still balance problems. Plan: Cannot increase carbamazepine due to balance issues Temporarily Ativan for agitation 0.5 mg tablets  DT mania stop fluoxetine If fails trial loxapine  01/15/2020 patient called after hours with suicidal thoughts and patient was to go to the [facility]. Patient ultimately admitted to [facility] [place] [facility] psychiatric unit.  Dr. Clovis Pu spoke with clinical pharmacist they are giving history of medication experience and recommendation for loxapine.  Patient hospital stay for 3 days and discharged on loxapine 10 mg nightly as the new medication.  02/10/2020 phone call patient complaining of insomnia.  Loxapine was increased from 10 to 20 mg nightly due to recent insomnia with mania.  02/14/2020 appointment with the following noted: Lately in tears Monday and Tuesday convinced she couldn't do her job.  Better last couple of days.  Motivation is not real good but not depressed like Monday and Tuesday. This week missing some meds bc couldn't get like Focalin.  Been taking other meds. No sig manic sx.  Sleep is better with more loxapine about 8 hours. Anxiety is chronic.  No SE loxapine so far unless a little dizzy here and there.  Past Psychiatric Medication Trials: Vrayla 4.5 SE mouth movements reduced to 3 mg 3/20 lithium 150,  Latuda 80, Trileptal 450, olanzapine, Seroquel, risperidone, Abilify Focalin,  Ritalin,  Depakote, Equetro 300 balance issues, Lamictal 200 twice daily, fluoxetine 60,  buspirone, sertraline 100, Wellbutrin history of facial tics, paroxetine cognitive side effects ropinirole, amantadine, Sinemet, Artane, Cogentin, trazodone hangover, Ambien hangover,  Review of Systems:  Review of Systems  HENT: Positive for tinnitus.        Chirping cricket sounds in hears since January  Gastrointestinal: Negative for nausea.  Musculoskeletal: Positive for  arthralgias. Negative for gait problem.  Neurological: Positive for dizziness and tremors. Negative for seizures, syncope and weakness.       Still balance problems. No falls lately.  Psychiatric/Behavioral: Positive for depression. Negative for agitation, behavioral problems, confusion, decreased concentration, dysphoric mood, hallucinations, self-injury, sleep disturbance and suicidal ideas. The patient is nervous/anxious. The patient is not hyperactive.   No falls since here. Not currently depressed but unable to remove this from the list.  Medications: I have reviewed the patient's current medications.  Current Outpatient Medications  Medication Sig Dispense Refill  . acetaminophen (TYLENOL) 325 MG tablet Take 325-650 mg by mouth every 6 (six) hours as needed for mild pain, moderate pain or headache.    Marland Kitchen atorvastatin (LIPITOR) 10 MG tablet Take 10 mg by mouth daily.    . carbamazepine (EQUETRO) 200 MG CP12 12 hr capsule Take 1 capsule (200 mg total) by mouth at bedtime. 90 capsule 0  . carbamazepine (TEGRETOL) 100 MG chewable  tablet Chew 1 tablet (100 mg total) by mouth at bedtime. 90 tablet 0  . cariprazine (VRAYLAR) capsule Take 1 capsule (3 mg total) by mouth daily. 30 capsule 2  . cetirizine (ZYRTEC) 10 MG tablet Take 10 mg by mouth daily.    . cholecalciferol (VITAMIN D) 25 MCG (1000 UNIT) tablet Take 1,000 Units by mouth daily.    Marland Kitchen dexmethylphenidate (FOCALIN) 10 MG tablet Take 1 tablet (10 mg total) by mouth daily after lunch. 30 tablet 0  . Dexmethylphenidate HCl (FOCALIN XR) 25 MG CP24 Take 1 capsule by mouth every morning. 30 capsule 0  . ferrous gluconate (FERGON) 324 MG tablet Take 324 mg by mouth daily with breakfast.    . lamoTRIgine (LAMICTAL) 200 MG tablet Take 1 tablet (200 mg total) by mouth 2 (two) times daily. 180 tablet 0  . lithium carbonate 150 MG capsule Take 1 capsule (150 mg total) by mouth daily in the afternoon. 90 capsule 1  . LORazepam (ATIVAN) 0.5 MG tablet  1-2 tablets every 8 hours as needed for agitation and anxiety (Patient taking differently: Take 0.5-1 mg by mouth every 8 (eight) hours as needed for anxiety (agitation). ) 50 tablet 0  . loxapine (LOXITANE) 10 MG capsule Take 2 capsules (20 mg total) by mouth at bedtime. 60 capsule 1  . pantoprazole (PROTONIX) 40 MG tablet Take 1 tablet (40 mg total) by mouth daily. 90 tablet 0   No current facility-administered medications for this visit.    Medication Side Effects: Other: tremor and weight gain.  Dyskinesia appears better  SE bettter than they were.  Balance problems intermittently  Allergies:  Allergies  Allergen Reactions  . Azithromycin Anaphylaxis  . Penicillins Anaphylaxis    DID THE REACTION INVOLVE: Swelling of the face/tongue/throat, SOB, or low BP? Yes Sudden or severe rash/hives, skin peeling, or the inside of the mouth or nose? Yes Did it require medical treatment? No When did it last happen? If all above answers are "NO", may proceed with cephalosporin use.  . Adhesive [Tape] Other (See Comments)    On bandaids    Past Medical History:  Diagnosis Date  . Allergy    Seasonal  . Anemia    History of GI blood loss  . Anxiety   . Arthritis   . Bipolar 1 disorder ([place])   . Colon polyps   . Depression   . Epistaxis    Around 2011 or 2012, required cauterization.   . Esophageal stricture   . GERD (gastroesophageal reflux disease)   . Headache(784.0)   . Hyperlipidemia   . Interstitial cystitis   . Lung cancer ([place]) 2002  . Obesity   . Sleep apnea    Doesn't use a CPAP    Family History  Problem Relation Age of Onset  . Arthritis Mother   . Hearing loss Mother   . Hyperlipidemia Mother   . Hypertension Mother   . Hypertension Father   . Diabetes Mellitus II Father   . Heart disease Father   . Arthritis Father   . Cancer Father        Brain  . COPD Father   . Diabetes Father   . Hyperlipidemia Father   . Early death Sister         Aneroxia/Bulimic  . Depression Brother   . Early death Investment banker, corporate  . Depression Daughter   . Drug abuse Daughter   . Heart disease Daughter   .  Hypertension Daughter   . Stroke Maternal Grandmother   . Hypertension Maternal Grandmother   . Arthritis Maternal Grandfather   . Heart attack Maternal Grandfather   . Hearing loss Maternal Grandfather   . Colon cancer Neg Hx   . Esophageal cancer Neg Hx   . Rectal cancer Neg Hx     Social History   Socioeconomic History  . Marital status: Married    Spouse name: Not on file  . Number of children: 1  . Years of education: Not on file  . Highest education level: Not on file  Occupational History  . Occupation: Admin. assistant  Tobacco Use  . Smoking status: Never Smoker  . Smokeless tobacco: Never Used  Vaping Use  . Vaping Use: Never used  Substance and Sexual Activity  . Alcohol use: Yes    Alcohol/week: 1.0 standard drink    Types: 1 Glasses of wine per week    Comment: Moderate  . Drug use: No  . Sexual activity: Yes  Other Topics Concern  . Not on file  Social History Narrative   Pt lives in [place] with husband Lanny Hurst.  Followed by Dr. Clovis Pu for psychiatry and Rinaldo Cloud for therapy.   Social Determinants of Health   Financial Resource Strain:   . Difficulty of Paying Living Expenses:   Food Insecurity:   . Worried About Charity fundraiser in the Last Year:   . Arboriculturist in the Last Year:   Transportation Needs:   . Film/video editor (Medical):   Marland Kitchen Lack of Transportation (Non-Medical):   Physical Activity:   . Days of Exercise per Week:   . Minutes of Exercise per Session:   Stress:   . Feeling of Stress :   Social Connections:   . Frequency of Communication with Friends and Family:   . Frequency of Social Gatherings with Friends and Family:   . Attends Religious Services:   . Active Member of Clubs or Organizations:   . Attends Archivist Meetings:   Marland Kitchen Marital  Status:   Intimate Partner Violence:   . Fear of Current or Ex-Partner:   . Emotionally Abused:   Marland Kitchen Physically Abused:   . Sexually Abused:     Past Medical History, Surgical history, Social history, and Family history were reviewed and updated as appropriate.   Please see review of systems for further details on the patient's review from today.   Objective:   Physical Exam:  There were no vitals taken for this visit.  Physical Exam Constitutional:      General: She is not in acute distress.    Appearance: She is well-developed.  Musculoskeletal:        General: No deformity.  Neurological:     Mental Status: She is alert and oriented to person, place, and time.     Cranial Nerves: No dysarthria.     Motor: Tremor present.     Coordination: Coordination normal.  Psychiatric:        Attention and Perception: Attention and perception normal. She does not perceive auditory or visual hallucinations.        Mood and Affect: Mood is anxious and depressed. Affect is not labile, blunt, angry, tearful or inappropriate.        Speech: Speech normal. Speech is not rapid and pressured or slurred.        Behavior: Behavior is not agitated. Behavior is cooperative.  Thought Content: Thought content normal. Thought content is not paranoid or delusional. Thought content does not include homicidal or suicidal ideation. Thought content does not include homicidal or suicidal plan.        Cognition and Memory: Cognition normal. She exhibits impaired recent memory.        Judgment: Judgment normal.     Comments: Insight fair. No manic sx Not agitated in office     Lab Review:     Component Value Date/Time   NA 141 01/19/2020 1158   K 4.2 01/19/2020 1158   CL 107 01/19/2020 1158   CO2 26 01/19/2020 1158   GLUCOSE 102 (H) 01/19/2020 1158   BUN 16 01/19/2020 1158   CREATININE 0.75 01/19/2020 1158   CALCIUM 9.2 01/19/2020 1158   PROT 6.9 01/19/2020 1158   ALBUMIN 3.9 01/19/2020  1158   AST 20 01/19/2020 1158   ALT 13 01/19/2020 1158   ALKPHOS 65 01/19/2020 1158   BILITOT 0.4 01/19/2020 1158   GFRNONAA >60 01/19/2020 1158   GFRAA >60 01/19/2020 1158       Component Value Date/Time   WBC 7.4 01/19/2020 1158   RBC 4.19 01/19/2020 1158   HGB 12.7 01/19/2020 1158   HCT 39.6 01/19/2020 1158   PLT 250 01/19/2020 1158   MCV 94.5 01/19/2020 1158   MCH 30.3 01/19/2020 1158   MCHC 32.1 01/19/2020 1158   RDW 13.9 01/19/2020 1158   LYMPHSABS 2.3 04/04/2019 1004   MONOABS 0.7 04/04/2019 1004   EOSABS 1.0 (H) 04/04/2019 1004   BASOSABS 0.1 04/04/2019 1004  Vitamin D level 33 on 10K units daily on 12/4/`9 Increased to prescription vitamin d 50K units Monday, Wed, Friday.  Rx sent in.   Lithium Lvl  Date Value Ref Range Status  10/21/2018 0.18 (L) 0.60 - 1.20 mmol/L Final    Comment:    Performed at [facility], [address] [place], [place] [address]     No results found for: PHENYTOIN, PHENOBARB, VALPROATE, CBMZ   .res Assessment: Plan:    Mild cognitive impairment  Bipolar I disorder with rapid cycling ([place]) - Plan: cariprazine (VRAYLAR) capsule, loxapine (LOXITANE) 10 MG capsule, carbamazepine (TEGRETOL) 100 MG chewable tablet, carbamazepine (EQUETRO) 200 MG CP12 12 hr capsule, lamoTRIgine (LAMICTAL) 200 MG tablet, lithium carbonate 150 MG capsule  Generalized anxiety disorder  Attention deficit hyperactivity disorder (ADHD), predominantly inattentive type - Plan: dexmethylphenidate (FOCALIN) 10 MG tablet, Dexmethylphenidate HCl (FOCALIN XR) 25 MG CP24  Tremor due to multiple drugs  Concussion without loss of consciousness, sequela (HCC)  Greater than 50% of 30 min face to face time with patient was spent on counseling and coordination of care. We discussed the following: Cayden has chronic rapid cycling bipolar disorder which is chronically unstable and has been difficult to control.  The rapid cycling is making it difficult to control frequency of  depressive episodes and the anxiety as well.  We have typically had to make frequent med changes.  We have had to reduce mood stabilizer dosages including Vraylar and Equetro she is very sensitive to carbamazepine because higher doses cause dizziness.   Overall rapid cycling pattern continues.   Med reconciliation with DC hospital meds.  Disc availabilty problems with loxapine.  Since last year when she was stable she started having hypomanic symptoms again.  We increased Equetro back to 300 mg nightly but she she has had balance problems.  It had stopped the manic symptoms but then she started having balance problems again  and we had to switch it back to Equetro 300 and add carbamazepine immediate release 100 nightly.. Less neurological problems with reduction in Vraylar.   It could have been her prior neurologic symptoms were related to the combination of medications.  But I am hesitant to increase the Vraylar unless absolutely necessary.  Mania resolved off fluoxetine and on loxapine 20 mg tolerated so far.  Can't function without Focalin.  ADD managed with Focalin.  Discussed the risk of stimulants including that that could contribute to mood instability and mood swings.  She appears to need it in order to function effectively at work.  She has a high residual anxiety.  It has been impossible to control all of her symptoms simultaneously without causing side effects.  She agrees.  Continue the following Discussed side effects of each medicine. Focalin XR 25 mg every morning and Focalin 10 mg immediate release daily Equetro 200 mg nightly Carbamazepine immediate release 100 mg nightly Continue loxapine 20 mg HS. Lamotrigine 200 mg twice daily Lithium 150 mg nightly Vraylar 3 mg daily  Discussed potential metabolic side effects associated with atypical antipsychotics, as well as potential risk for movement side effects. Advised pt to contact office if movement side effects occur.  She may be  having some mild TD with toe movement.  Disc SE meds and this is heightened by the complication of necessary polypharmacy.  FU results of PT for balance.    Supportive therapy in terms of dealing with husband's addiction.  She's interested in Bipolar Support Group and info given.  Requires frequent FU DT chronic instability.  FU 2-3 weeks.  Lynder Parents MD, DFAPA  Please see After Visit Summary for patient specific instructions.  Future Appointments  Date Time Provider [place]  02/21/2020  8:45 AM Felipa Furnace, DPM [facility]  02/24/2020  8:00 AM Shanon Ace, LCSW [facility] None  02/25/2020 10:00 AM Cottle, Billey Co., MD [facility] None  03/02/2020  8:00 AM [name], DO [facility] None  03/06/2020  8:00 AM Shanon Ace, LCSW [facility] None  03/11/2020  5:00 PM Shanon Ace, LCSW [facility] None  03/16/2020  7:20 AM [name], DO [facility] None  03/20/2020  8:00 AM Shanon Ace, LCSW [facility] None  04/13/2020  1:30 PM Cottle, Billey Co., MD [facility] None  04/15/2020  2:30 PM [facility] DX DEXA 1 [facility]    No orders of the defined types were placed in this encounter.     -------------------------------

## 2020-02-21 ENCOUNTER — Other Ambulatory Visit: Payer: Self-pay

## 2020-02-21 ENCOUNTER — Ambulatory Visit (INDEPENDENT_AMBULATORY_CARE_PROVIDER_SITE_OTHER): Payer: BC Managed Care – PPO | Admitting: Podiatry

## 2020-02-21 ENCOUNTER — Encounter: Payer: Self-pay | Admitting: Podiatry

## 2020-02-21 ENCOUNTER — Other Ambulatory Visit: Payer: BC Managed Care – PPO

## 2020-02-21 DIAGNOSIS — M899 Disorder of bone, unspecified: Secondary | ICD-10-CM

## 2020-02-21 DIAGNOSIS — M949 Disorder of cartilage, unspecified: Secondary | ICD-10-CM

## 2020-02-21 DIAGNOSIS — M1990 Unspecified osteoarthritis, unspecified site: Secondary | ICD-10-CM

## 2020-02-21 MED ORDER — CELECOXIB 200 MG PO CAPS
200.0000 mg | ORAL_CAPSULE | Freq: Two times a day (BID) | ORAL | 1 refills | Status: DC
Start: 2020-02-21 — End: 2020-03-16

## 2020-02-23 ENCOUNTER — Encounter: Payer: Self-pay | Admitting: Podiatry

## 2020-02-23 NOTE — Progress Notes (Signed)
Subjective:  Patient ID: Megan Whitney, female    DOB: [birth year],  MRN: [id number]  Chief Complaint  Patient presents with  . Foot Problem    the left ankle was doing bad last week and the injection that was done 3 months ago i had a reaction to it    64 y.o. female presents with the above complaint.  Patient presents with a left chronic ankle arthritis with underlying OCD lesions medial lateral side.  Patient had received injections about 4 months ago by me which seem to have given her good amount of relief.  Patient would like another injection as it has been lasting her for quite some time.  Patient states that she did have a reaction last time where she it was hot painful and like swallow but it did go away.  Patient states that she did not have any pain until last week.  She denies any other acute complaints.  She is managing well with steroid injection.   Review of Systems: Negative except as noted in the HPI. Denies N/V/F/Ch.  Past Medical History:  Diagnosis Date  . Allergy    Seasonal  . Anemia    History of GI blood loss  . Anxiety   . Arthritis   . Bipolar 1 disorder ([place])   . Colon polyps   . Depression   . Epistaxis    Around 2011 or 2012, required cauterization.   . Esophageal stricture   . GERD (gastroesophageal reflux disease)   . Headache(784.0)   . Hyperlipidemia   . Interstitial cystitis   . Lung cancer ([place]) 2002  . Obesity   . Sleep apnea    Doesn't use a CPAP    Current Outpatient Medications:  .  loxapine (LOXITANE) 10 MG capsule, Take by mouth., Disp: , Rfl:  .  atorvastatin (LIPITOR) 10 MG tablet, Take 10 mg by mouth daily., Disp: , Rfl:  .  carbamazepine (TEGRETOL) 100 MG chewable tablet, Chew 1 tablet (100 mg total) by mouth at bedtime., Disp: 90 tablet, Rfl: 0 .  cariprazine (VRAYLAR) capsule, Take 1 capsule (3 mg total) by mouth daily., Disp: 30 capsule, Rfl: 2 .  celecoxib (CELEBREX) 200 MG capsule, Take 1 capsule (200 mg total) by mouth 2  (two) times daily., Disp: 30 capsule, Rfl: 1 .  cetirizine (ZYRTEC) 10 MG tablet, Take 10 mg by mouth daily., Disp: , Rfl:  .  cholecalciferol (VITAMIN D) 25 MCG (1000 UNIT) tablet, Take 1,000 Units by mouth daily., Disp: , Rfl:  .  Dexmethylphenidate HCl (FOCALIN XR) 25 MG CP24, Take 1 capsule by mouth every morning., Disp: 30 capsule, Rfl: 0 .  ferrous gluconate (FERGON) 324 MG tablet, Take 324 mg by mouth daily with breakfast., Disp: , Rfl:  .  lamoTRIgine (LAMICTAL) 200 MG tablet, Take 1 tablet (200 mg total) by mouth 2 (two) times daily., Disp: 180 tablet, Rfl: 0 .  lithium carbonate 150 MG capsule, Take 1 capsule (150 mg total) by mouth daily in the afternoon., Disp: 90 capsule, Rfl: 1 .  loxapine (LOXITANE) 10 MG capsule, Take 2 capsules (20 mg total) by mouth at bedtime., Disp: 60 capsule, Rfl: 1 .  pantoprazole (PROTONIX) 40 MG tablet, Take 1 tablet (40 mg total) by mouth daily., Disp: 90 tablet, Rfl: 0  Social History   Tobacco Use  Smoking Status Never Smoker  Smokeless Tobacco Never Used    Allergies  Allergen Reactions  . Azithromycin Anaphylaxis  . Penicillins Anaphylaxis  DID THE REACTION INVOLVE: Swelling of the face/tongue/throat, SOB, or low BP? Yes Sudden or severe rash/hives, skin peeling, or the inside of the mouth or nose? Yes Did it require medical treatment? No When did it last happen? If all above answers are "NO", may proceed with cephalosporin use.  . Adhesive [Tape] Other (See Comments)    On bandaids   Objective:  There were no vitals filed for this visit. There is no height or weight on file to calculate BMI. Constitutional Well developed. Well nourished.  Vascular Dorsalis pedis pulses palpable bilaterally. Posterior tibial pulses palpable bilaterally. Capillary refill normal to all digits.  No cyanosis or clubbing noted. Pedal hair growth normal.  Neurologic Normal speech. Oriented to person, place, and time. Epicritic sensation to  light touch grossly present bilaterally.  Dermatologic Nails well groomed and normal in appearance. No open wounds. No skin lesions.  Orthopedic:  Pain on palpation to the medial lateral gutter of the ankle joint.  Pain with posterior ankle joint as well.  No pain at the peroneal tendon, posterior tibial tendon, Achilles tendon, ATFL.  Pain with range of motion of the ankle joint dorsiflexion as well as plantarflexion active and passive.   Radiographs: None Assessment:   1. Osteochondral talar dome lesion   2. Arthritis    Plan:  Patient was evaluated and treated and all questions answered.  Left osteochondral lesion of the talar dome medial and lateral -Given that her pain is recurring every 3 months or so.  I do not mind continuing to do steroid injection if she tends to get about 3 to 4 months of relief.  Patient states understanding would like to proceed with a steroid injection. -A steroid injection was performed at left ankle using 1% plain Lidocaine and 10 mg of Kenalog. This was well tolerated. -I will see her back again in 3 months for evaluation and management of the left chronic ankle pain -Celebrex was dispensed for pain control.  No follow-ups on file.

## 2020-02-24 ENCOUNTER — Other Ambulatory Visit: Payer: Self-pay

## 2020-02-24 ENCOUNTER — Ambulatory Visit (INDEPENDENT_AMBULATORY_CARE_PROVIDER_SITE_OTHER): Payer: BC Managed Care – PPO | Admitting: Psychiatry

## 2020-02-24 DIAGNOSIS — F319 Bipolar disorder, unspecified: Secondary | ICD-10-CM

## 2020-02-24 NOTE — Progress Notes (Signed)
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 Exam:  Appearance:   Casual     Behavior:  Appropriate, Sharing and Motivated  Motor:  Normal  Speech/Language:   Normal Rate  Affect:  anxious, some crying, depression  Mood:  anxious, depressed and slight irritability  Thought process:  goal directed  Thought content:    Patient reporting "jumbled" with current stressors that make it hard to think clearly  Sensory/Perceptual disturbances:    WNL  Orientation:  oriented to person, place, time/date, situation, day of week, month of year and year  Attention:  sometimes Fair  Concentration:  Good and Fair  Memory:  forgetfulness "much worse under stress"  Fund of knowledge:   Good  Insight:    Good and Fair; (metimes Fair)  Judgment:   Good and Fair  Impulse Control:  Good and Fair   Risk Assessment: Danger to Self:  No Self-injurious Behavior: No Danger to Others: No Duty to Warn:no Physical Aggression / Violence:No  Access to Firearms a concern: No  Gang Involvement:Yes   Subjective:  Patient in today very anxious and upset initially due to missing her 7:00am meds but has a solution with husband dropping off meds to her in about an hour.  Worked well in session today on some anxiety, "assumng the worst", depression, and worry.  Interventions: Solution-Oriented/Positive Psychology and Ego-Supportive  Diagnosis:   ICD-10-CM   1. Bipolar I disorder with rapid cycling ([place])  F31.9      Plan: Patient not signing tx plan on computer screen due to [place].  Treatment Goals: Goals remain on plan as patient works on strategies to meet her goals. Progress is noted each visit in "Progress" section of goal  plan.  Long Term Goal: Reduce overall level, frequency, and intensity of the anxiety so that daily functioning is not impaired.  Short Term Goal: 1.Increase understanding of the beliefs and messages that produce the worry and anxiety.  Strategies: 1.Help client develop reality-based positive cognitive messages. 2. Develop a "coping card" or other reminder which coping strategies are recorded for patient's later use .  PROGRESS: Patient in today very upset initially because she missed her morning meds which she said she normally takes at 7:00am.  Did call her husband from office to bring her meds so that help her calm some and the more we talked through things and strategized how to de-escalate.  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  Reviewed strategies for managing her stress and anxiety and emphasized not letting herself get into the destructive pattern of looking for what might go wrong versus right.    Goal review and progress noted with patient.  Next appt within 2 weeks.  Shanon Ace, LCSW

## 2020-02-25 ENCOUNTER — Ambulatory Visit (INDEPENDENT_AMBULATORY_CARE_PROVIDER_SITE_OTHER): Payer: BC Managed Care – PPO | Admitting: Psychiatry

## 2020-02-25 ENCOUNTER — Encounter: Payer: Self-pay | Admitting: Psychiatry

## 2020-02-25 DIAGNOSIS — G3184 Mild cognitive impairment, so stated: Secondary | ICD-10-CM | POA: Diagnosis not present

## 2020-02-25 DIAGNOSIS — F319 Bipolar disorder, unspecified: Secondary | ICD-10-CM | POA: Diagnosis not present

## 2020-02-25 DIAGNOSIS — F411 Generalized anxiety disorder: Secondary | ICD-10-CM | POA: Diagnosis not present

## 2020-02-25 DIAGNOSIS — F900 Attention-deficit hyperactivity disorder, predominantly inattentive type: Secondary | ICD-10-CM | POA: Diagnosis not present

## 2020-02-25 DIAGNOSIS — G251 Drug-induced tremor: Secondary | ICD-10-CM

## 2020-02-25 MED ORDER — CARIPRAZINE HCL 1.5 MG PO CAPS: 1.5000 mg | ORAL_CAPSULE | Freq: Every day | ORAL | 1 refills | Status: DC

## 2020-02-25 MED ORDER — DEXMETHYLPHENIDATE HCL 10 MG PO TABS: ORAL_TABLET | ORAL | 0 refills | Status: DC

## 2020-02-25 MED ORDER — LOXAPINE SUCCINATE 10 MG PO CAPS: 30.0000 mg | ORAL_CAPSULE | Freq: Every day | ORAL | 1 refills | Status: DC

## 2020-02-25 MED ORDER — DEXMETHYLPHENIDATE HCL ER 25 MG PO CP24: 1.0000 | ORAL_CAPSULE | ORAL | 0 refills | Status: DC

## 2020-02-25 MED ORDER — AMANTADINE HCL 100 MG PO CAPS: 100.0000 mg | ORAL_CAPSULE | Freq: Two times a day (BID) | ORAL | 1 refills | Status: DC

## 2020-02-25 NOTE — Progress Notes (Signed)
Megan Whitney [id number] 11-May-1956 64 y.o.     Subjective:   Patient ID:  Megan Whitney is a 64 y.o. (DOB [birth year]) female.   Chief Complaint:  Chief Complaint  Patient presents with  . Follow-up  . Anxiety  . Depression  . ADD  . Manic Behavior    Depression        Associated symptoms include no decreased concentration and no suicidal ideas.  Past medical history includes anxiety.   Anxiety Symptoms include nervous/anxious behavior. Patient reports no confusion, decreased concentration, dizziness, nausea or suicidal ideas.    Medication Refill Associated symptoms include arthralgias. Pertinent negatives include no nausea or weakness.   Megan Whitney is  follow-up of r chronic mood swings and anxiety and frequent changes in medications.   At visit December 27, 2018.  Focalin XR was increased from 20 mg to 25 mg daily to help with focus and attention and potentially mood.  When seen February 13, 2019.  In an effort to reduce mood cycling we reduce fluoxetine to 20 mg daily.  At visit August 2020.  No meds were changed.  She continued the following: Focalin XR 25 mg every morning and Focalin 10 mg immediate release daily Equetro 200 mg nightly Fluoxetine 20 mg daily Lamotrigine 200 mg twice daily Lithium 150 mg nightly Vraylar 3 mg daily  She called back November 4 after seeing her therapist stating that she was having some hypomanic symptoms with reduced sleep and increased energy.  This potentiality had been discussed and the decision was made to increase Equetro from 200 mg nightly to 300 mg nightly.  seen August 12, 2019.  Because of balance problems she did not tolerate Equetro 300 mg nightly and it was changed to Equetro 200 mg nightly plus immediate release carbamazepine 100 mg nightly.  Her mood had not been stable enough on Equetro 200 mg nightly alone. Less balance problems with change in CBZ.  seen September 23, 2019.  The following was changed: For bipolar  mixed increase CBZ IR to 200 mg HS.  Disc fall and balance risks.For bipolar mixed increase CBZ IR to 200 mg HS.  Disc fall and balance risks.  She called back October 23, 2019 stating she had had another fall and felt it was due to the medication.  Therefore carbamazepine immediate release was reduced from 200 mg nightly to 100 mg nightly.  The Equetro is unchanged.  Last seen November 04, 2019.  The following was noted:  Better at the moment but balance is still somewhat of a problem.  Started PT to help balance.  Had a fall after tripping on a curb and hit her head on sidewalk.  Got a concussion with nausea and HA and dizziness and light sensitivity.  Not over it.  Concentration problems.  Has gotten back to work after a week.   Mood sx pretty good with some mild depression.  Nothing severe.  Trying to minimize stress and self care as much as possible.  No manic sx lately and sleeping fairly well.  No racing thoughts.   Working another year and plans to retire but H alcoholic and not sure it will be good to be there all the time. Seeing therapist q 2 weeks.  Therapy helping . Recent serum vitamin D level was determined to be low at 33.  The goal and chronically depressed patient's is in the 50s if possible.  So her vitamin D was increased on August 08, 2018 or  thereabouts.  Checked vitamin D level again and this time it was high at 120 and so it was stopped.  She's restarted per PCP at 1000 units daily.  01/06/2020 appointment the following is noted: Still on: Focalin XR 25 mg every morning and Focalin 10 mg immediate release daily Equetro 200 mg nightly Carbamazepine immediate release 100 mg nightly Fluoxetine 20 mg daily Lamotrigine 200 mg twice daily Lithium 150 mg nightly Vraylar 3 mg daily Not good manic.  Angry.  Missed 2 days bc sx.  Last week vacation which didn't go well.  Crying last week and missed a day.  "Pissed off at the whole world" but also depressed and hard to get OOB today.   Everything makes me angry.   Blows up without control.  Then regrets it.  Sleep irregular lately. Finished PT which might have helped some but still balance problems. Plan: Cannot increase carbamazepine due to balance issues Temporarily Ativan for agitation 0.5 mg tablets  DT mania stop fluoxetine If fails trial loxapine  01/15/2020 patient called after hours with suicidal thoughts and patient was to go to the [facility]. Patient ultimately admitted to [facility] unit.  Dr. Clovis Pu spoke with clinical pharmacist they are giving history of medication experience and recommendation for loxapine.  Patient hospital stay for 3 days and discharged on loxapine 10 mg nightly as the new medication.  02/10/2020 phone call patient complaining of insomnia.  Loxapine was increased from 10 to 20 mg nightly due to recent insomnia with mania.  02/14/2020 appointment with the following noted: Lately in tears Monday and Tuesday convinced she couldn't do her job.  Better last couple of days.  Motivation is not real good but not depressed like Monday and Tuesday. This week missing some meds bc couldn't get like Focalin.  Been taking other meds. No sig manic sx.  Sleep is better with more loxapine about 8 hours. Anxiety is chronic.  No SE loxapine so far unless a little dizzy here and there. No med changes.  02/25/2020 appointment urgently made after patient was recently hospitalized.  The following is noted: Unstable.  Today manic driving erratically.  Talking a mile a minute.  Not thinking clearly.  Angry.  Slept OK last night.  Hyperactive with poor productivity for a couple of days.  Weekend OK overall.   No falls lately. More tremor lately.  Retiring July 30.  Past Psychiatric Medication Trials: Vrayla 4.5 SE mouth movements reduced to 3 mg 3/20 lithium 150,  Latuda 80, Trileptal 450, olanzapine, Seroquel, risperidone, Abilify Focalin,  Ritalin,  Depakote,  Equetro 300 balance issues, Lamictal 200 twice daily, fluoxetine 60,  buspirone, sertraline 100, Wellbutrin history of facial tics, paroxetine cognitive side effects ropinirole, amantadine, Sinemet, Artane, Cogentin, trazodone hangover, Ambien hangover,  Review of Systems:  Review of Systems  HENT: Positive for tinnitus.        Chirping cricket sounds in hears since January  Gastrointestinal: Negative for nausea.  Musculoskeletal: Positive for arthralgias. Negative for gait problem.  Neurological: Positive for tremors. Negative for dizziness, seizures, syncope and weakness.       Still balance problems. No falls lately.  Psychiatric/Behavioral: Positive for depression. Negative for agitation, behavioral problems, confusion, decreased concentration, dysphoric mood, hallucinations, self-injury, sleep disturbance and suicidal ideas. The patient is nervous/anxious. The patient is not hyperactive.   No falls since here. Not currently depressed but unable to remove this from the list.  Medications: I have reviewed the patient's  current medications.  Current Outpatient Medications  Medication Sig Dispense Refill  . atorvastatin (LIPITOR) 10 MG tablet Take 10 mg by mouth daily.    . carbamazepine (TEGRETOL) 100 MG chewable tablet Chew 1 tablet (100 mg total) by mouth at bedtime. 90 tablet 0  . cariprazine (VRAYLAR) capsule Take 1 capsule (1.5 mg total) by mouth daily. 30 capsule 1  . celecoxib (CELEBREX) 200 MG capsule Take 1 capsule (200 mg total) by mouth 2 (two) times daily. 30 capsule 1  . cetirizine (ZYRTEC) 10 MG tablet Take 10 mg by mouth daily.    . cholecalciferol (VITAMIN D) 25 MCG (1000 UNIT) tablet Take 1,000 Units by mouth daily.    Marland Kitchen dexmethylphenidate (FOCALIN) 10 MG tablet 1 tablet at 1 PM 30 tablet 0  . Dexmethylphenidate HCl (FOCALIN XR) 25 MG CP24 Take 1 capsule by mouth every morning. 30 capsule 0  . ferrous gluconate (FERGON) 324 MG tablet Take 324 mg by mouth daily with  breakfast.    . lamoTRIgine (LAMICTAL) 200 MG tablet Take 1 tablet (200 mg total) by mouth 2 (two) times daily. 180 tablet 0  . lithium carbonate 150 MG capsule Take 1 capsule (150 mg total) by mouth daily in the afternoon. 90 capsule 1  . loxapine (LOXITANE) 10 MG capsule Take 3 capsules (30 mg total) by mouth at bedtime. 90 capsule 1  . pantoprazole (PROTONIX) 40 MG tablet Take 1 tablet (40 mg total) by mouth daily. 90 tablet 0  . amantadine (SYMMETREL) 100 MG capsule Take 1 capsule (100 mg total) by mouth 2 (two) times daily. 60 capsule 1   No current facility-administered medications for this visit.    Medication Side Effects: Other: tremor and weight gain.  Dyskinesia appears better  SE bettter than they were.  Balance problems intermittently  Allergies:  Allergies  Allergen Reactions  . Azithromycin Anaphylaxis  . Penicillins Anaphylaxis    DID THE REACTION INVOLVE: Swelling of the face/tongue/throat, SOB, or low BP? Yes Sudden or severe rash/hives, skin peeling, or the inside of the mouth or nose? Yes Did it require medical treatment? No When did it last happen? If all above answers are "NO", may proceed with cephalosporin use.  . Adhesive [Tape] Other (See Comments)    On bandaids    Past Medical History:  Diagnosis Date  . Allergy    Seasonal  . Anemia    History of GI blood loss  . Anxiety   . Arthritis   . Bipolar 1 disorder ([place])   . Colon polyps   . Depression   . Epistaxis    Around 2011 or 2012, required cauterization.   . Esophageal stricture   . GERD (gastroesophageal reflux disease)   . Headache(784.0)   . Hyperlipidemia   . Interstitial cystitis   . Lung cancer ([place]) 2002  . Obesity   . Sleep apnea    Doesn't use a CPAP    Family History  Problem Relation Age of Onset  . Arthritis Mother   . Hearing loss Mother   . Hyperlipidemia Mother   . Hypertension Mother   . Hypertension Father   . Diabetes Mellitus II Father   . Heart disease  Father   . Arthritis Father   . Cancer Father        Brain  . COPD Father   . Diabetes Father   . Hyperlipidemia Father   . Early death Sister        Aneroxia/Bulimic  . Depression  Brother   . Early death Investment banker, corporate  . Depression Daughter   . Drug abuse Daughter   . Heart disease Daughter   . Hypertension Daughter   . Stroke Maternal Grandmother   . Hypertension Maternal Grandmother   . Arthritis Maternal Grandfather   . Heart attack Maternal Grandfather   . Hearing loss Maternal Grandfather   . Colon cancer Neg Hx   . Esophageal cancer Neg Hx   . Rectal cancer Neg Hx     Social History   Socioeconomic History  . Marital status: Married    Spouse name: Not on file  . Number of children: 1  . Years of education: Not on file  . Highest education level: Not on file  Occupational History  . Occupation: Admin. assistant  Tobacco Use  . Smoking status: Never Smoker  . Smokeless tobacco: Never Used  Vaping Use  . Vaping Use: Never used  Substance and Sexual Activity  . Alcohol use: Yes    Alcohol/week: 1.0 standard drink    Types: 1 Glasses of wine per week    Comment: Moderate  . Drug use: No  . Sexual activity: Yes  Other Topics Concern  . Not on file  Social History Narrative   Pt lives in [place] with husband Lanny Hurst.  Followed by Dr. Clovis Pu for psychiatry and Rinaldo Cloud for therapy.   Social Determinants of Health   Financial Resource Strain:   . Difficulty of Paying Living Expenses:   Food Insecurity:   . Worried About Charity fundraiser in the Last Year:   . Arboriculturist in the Last Year:   Transportation Needs:   . Film/video editor (Medical):   Marland Kitchen Lack of Transportation (Non-Medical):   Physical Activity:   . Days of Exercise per Week:   . Minutes of Exercise per Session:   Stress:   . Feeling of Stress :   Social Connections:   . Frequency of Communication with Friends and Family:   . Frequency of Social Gatherings  with Friends and Family:   . Attends Religious Services:   . Active Member of Clubs or Organizations:   . Attends Archivist Meetings:   Marland Kitchen Marital Status:   Intimate Partner Violence:   . Fear of Current or Ex-Partner:   . Emotionally Abused:   Marland Kitchen Physically Abused:   . Sexually Abused:     Past Medical History, Surgical history, Social history, and Family history were reviewed and updated as appropriate.   Please see review of systems for further details on the patient's review from today.   Objective:   Physical Exam:  There were no vitals taken for this visit.  Physical Exam Constitutional:      General: She is not in acute distress.    Appearance: She is well-developed.  Musculoskeletal:        General: No deformity.  Neurological:     Mental Status: She is alert and oriented to person, place, and time.     Cranial Nerves: No dysarthria.     Motor: Tremor present.     Coordination: Coordination normal.  Psychiatric:        Attention and Perception: Attention and perception normal. She does not perceive auditory or visual hallucinations.        Mood and Affect: Mood is anxious. Mood is not depressed. Affect is not labile, blunt, angry, tearful or inappropriate.  Speech: Speech normal. Speech is not rapid and pressured or slurred.        Behavior: Behavior is not agitated. Behavior is cooperative.        Thought Content: Thought content normal. Thought content is not paranoid or delusional. Thought content does not include homicidal or suicidal ideation. Thought content does not include homicidal or suicidal plan.        Cognition and Memory: Cognition normal. She exhibits impaired recent memory.        Judgment: Judgment normal.     Comments: Insight fair. More manic sx and less depression.  More tremor. Not agitated in office     Lab Review:     Component Value Date/Time   NA 141 01/19/2020 1158   K 4.2 01/19/2020 1158   CL 107 01/19/2020 1158    CO2 26 01/19/2020 1158   GLUCOSE 102 (H) 01/19/2020 1158   BUN 16 01/19/2020 1158   CREATININE 0.75 01/19/2020 1158   CALCIUM 9.2 01/19/2020 1158   PROT 6.9 01/19/2020 1158   ALBUMIN 3.9 01/19/2020 1158   AST 20 01/19/2020 1158   ALT 13 01/19/2020 1158   ALKPHOS 65 01/19/2020 1158   BILITOT 0.4 01/19/2020 1158   GFRNONAA >60 01/19/2020 1158   GFRAA >60 01/19/2020 1158       Component Value Date/Time   WBC 7.4 01/19/2020 1158   RBC 4.19 01/19/2020 1158   HGB 12.7 01/19/2020 1158   HCT 39.6 01/19/2020 1158   PLT 250 01/19/2020 1158   MCV 94.5 01/19/2020 1158   MCH 30.3 01/19/2020 1158   MCHC 32.1 01/19/2020 1158   RDW 13.9 01/19/2020 1158   LYMPHSABS 2.3 04/04/2019 1004   MONOABS 0.7 04/04/2019 1004   EOSABS 1.0 (H) 04/04/2019 1004   BASOSABS 0.1 04/04/2019 1004  Vitamin D level 33 on 10K units daily on 12/4/`9 Increased to prescription vitamin d 50K units Monday, Wed, Friday.  Rx sent in.   Lithium Lvl  Date Value Ref Range Status  10/21/2018 0.18 (L) 0.60 - 1.20 mmol/L Final    Comment:    Performed at [facility], [address], [place], [place] [address]     No results found for: PHENYTOIN, PHENOBARB, VALPROATE, CBMZ   .res Assessment: Plan:    Bipolar I disorder with rapid cycling ([place]) - Plan: loxapine (LOXITANE) 10 MG capsule, cariprazine (VRAYLAR) capsule  Bipolar I disorder ([place])  Generalized anxiety disorder  Attention deficit hyperactivity disorder (ADHD), predominantly inattentive type - Plan: dexmethylphenidate (FOCALIN) 10 MG tablet, Dexmethylphenidate HCl (FOCALIN XR) 25 MG CP24  Mild cognitive impairment  Tremor due to multiple drugs - Plan: amantadine (SYMMETREL) 100 MG capsule  Greater than 50% of 30 min face to face time with patient was spent on counseling and coordination of care. We discussed the following: Yasamin has chronic rapid cycling bipolar disorder which is chronically unstable and has been difficult to control.  The rapid cycling  is making it difficult to control frequency of depressive episodes and the anxiety as well.  We have typically had to make frequent med changes.  We have had to reduce mood stabilizer dosages including Vraylar and Equetro she is very sensitive to carbamazepine because higher doses cause dizziness.   Overall rapid cycling pattern continues.   Still unstable and manic.  Increase loxapine 30 mg HS with goal of weaning Vraylar bc pt doesn't tolerate enough Vraylar to manage mood. Reduce Vraylar to 1.5 mg daily with goal of weaning it if possible.  Since last year when she was stable she started having hypomanic symptoms again.  We increased Equetro back to 300 mg nightly but she she has had balance problems.  It had stopped the manic symptoms but then she started having balance problems again and we had to switch it back to Equetro 300 and add carbamazepine immediate release 100 nightly.. Less neurological problems with reduction in Vraylar.   It could have been her prior neurologic symptoms were related to the combination of medications.  But I am hesitant to increase the Vraylar unless absolutely necessary.  ADD managed with Focalin.  Discussed the risk of stimulants including that that could contribute to mood instability and mood swings.  She appears to need it in order to function effectively at work.  She has a high residual anxiety.  It has been impossible to control all of her symptoms simultaneously without causing side effects.  She agrees.  Continue the following Discussed side effects of each medicine. Focalin XR 25 mg every morning and Focalin 10 mg immediate release daily Equetro 200 mg nightly Carbamazepine immediate release 100 mg nightly Increase loxapine 30 mg HS. Lamotrigine 200 mg twice daily Lithium 150 mg nightly Reduce Vraylar 1.5 mg daily  Discussed potential metabolic side effects associated with atypical antipsychotics, as well as potential risk for movement side effects.  Advised pt to contact office if movement side effects occur.  She may be having some mild TD with toe movement.  Option treat tremor.  Want to minimize cog SE so trial amanatadine 100 mg BID prn tremor.  Disc SE meds and this is heightened by the complication of necessary polypharmacy.  FU results of PT for balance.    Supportive therapy in terms of dealing with husband's addiction.  She's interested in Bipolar Support Group and info given.  Requires frequent FU DT chronic instability.  FU 2-3 weeks.  Lynder Parents MD, DFAPA  Please see After Visit Summary for patient specific instructions. For tremor amantadine 100 mg twice a day if needed.  Increase loxapine to 3 capsules 1 to 2 hours before bedtime  Reduce Vraylar to 1.5 mg daily or 3 mg every other day.  Future Appointments  Date Time Provider [place]  03/02/2020  8:00 AM [name], DO [facility] None  03/06/2020  8:00 AM Shanon Ace, LCSW [facility] None  03/11/2020  5:00 PM Shanon Ace, LCSW [facility] None  03/16/2020  7:20 AM Raliegh Scarlet, Neoma Laming, DO [facility] None  03/20/2020  8:00 AM Shanon Ace, LCSW [facility] None  03/31/2020  8:00 AM Shanon Ace, LCSW CP[facility] None  04/07/2020 10:00 AM Shanon Ace, LCSW CP[facility] None  04/13/2020  1:30 PM Cottle, Billey Co., MD CP-CP None  04/14/2020 10:00 AM Shanon Ace, LCSW [facility] None  04/15/2020  2:30 PM [facility] DX DEXA 1 [facility]  04/22/2020 10:00 AM Shanon Ace, LCSW [facility] None  05/29/2020 10:15 AM Felipa Furnace, DPM [facility]    No orders of the defined types were placed in this encounter.     -------------------------------

## 2020-02-25 NOTE — Patient Instructions (Signed)
For tremor amantadine 100 mg twice a day if needed.  Increase loxapine to 3 capsules 1 to 2 hours before bedtime  Reduce Vraylar to 1.5 mg daily or 3 mg every other day.

## 2020-03-02 ENCOUNTER — Ambulatory Visit (INDEPENDENT_AMBULATORY_CARE_PROVIDER_SITE_OTHER): Payer: BC Managed Care – PPO | Admitting: Family Medicine

## 2020-03-02 ENCOUNTER — Encounter (INDEPENDENT_AMBULATORY_CARE_PROVIDER_SITE_OTHER): Payer: Self-pay | Admitting: Family Medicine

## 2020-03-02 ENCOUNTER — Other Ambulatory Visit: Payer: Self-pay

## 2020-03-02 VITALS — BP 118/76 | HR 78 | Temp 98.0°F | Ht 60.0 in | Wt 188.0 lb

## 2020-03-02 DIAGNOSIS — Z9189 Other specified personal risk factors, not elsewhere classified: Secondary | ICD-10-CM | POA: Diagnosis not present

## 2020-03-02 DIAGNOSIS — Z6836 Body mass index (BMI) 36.0-36.9, adult: Secondary | ICD-10-CM

## 2020-03-02 DIAGNOSIS — R5383 Other fatigue: Secondary | ICD-10-CM

## 2020-03-02 DIAGNOSIS — E7849 Other hyperlipidemia: Secondary | ICD-10-CM | POA: Diagnosis not present

## 2020-03-02 DIAGNOSIS — Z0289 Encounter for other administrative examinations: Secondary | ICD-10-CM

## 2020-03-02 DIAGNOSIS — D649 Anemia, unspecified: Secondary | ICD-10-CM

## 2020-03-02 DIAGNOSIS — R7301 Impaired fasting glucose: Secondary | ICD-10-CM

## 2020-03-02 DIAGNOSIS — F319 Bipolar disorder, unspecified: Secondary | ICD-10-CM

## 2020-03-02 DIAGNOSIS — S329XXD Fracture of unspecified parts of lumbosacral spine and pelvis, subsequent encounter for fracture with routine healing: Secondary | ICD-10-CM

## 2020-03-02 DIAGNOSIS — K219 Gastro-esophageal reflux disease without esophagitis: Secondary | ICD-10-CM

## 2020-03-02 DIAGNOSIS — R0602 Shortness of breath: Secondary | ICD-10-CM | POA: Diagnosis not present

## 2020-03-02 DIAGNOSIS — Z1331 Encounter for screening for depression: Secondary | ICD-10-CM

## 2020-03-02 MED ORDER — LOXAPINE SUCCINATE 10 MG PO CAPS: 30.0000 mg | ORAL_CAPSULE | Freq: Every day | ORAL | 1 refills | Status: DC

## 2020-03-02 NOTE — Progress Notes (Signed)
Dear Megan Noble, PA-C,   Thank you for referring Megan Whitney to our clinic. The following note includes my evaluation and treatment recommendations.  Chief Complaint:   OBESITY Megan Whitney (MR# [id number]) is a 64 y.o. female who presents for evaluation and treatment of obesity and related comorbidities. Current BMI is Body mass index is 36.72 kg/m. Megan Whitney has been struggling with her weight for many years and has been unsuccessful in either losing weight, maintaining weight loss, or reaching her healthy weight goal.  Megan Whitney is currently in the action stage of change and ready to dedicate time achieving and maintaining a healthier weight. Megan Whitney is interested in becoming our patient and working on intensive lifestyle modifications including (but not limited to) diet and exercise for weight loss.  Megan Whitney's husband is a Megan Whitney.  He has a history of gastric sleeve surgery.  Megan Whitney will be retiring at the end of July (she is an Megan Whitney).  She needs to take care of herself and wants to be healthier.  Now that she is retiring, she wants to be able to enjoy life.  Megan Whitney's habits were reviewed today and are as follows: Her family eats meals together, she struggles with family and or coworkers weight loss sabotage, her desired weight loss is 68 pounds, she has been heavy most of her life, she started gaining weight during pregnancy, her heaviest weight ever was 228 pounds, she craves sweets and carbs (bread), she snacks frequently in the evenings, she skips lunch frequently, she frequently makes poor food choices, she has problems with excessive hunger, she frequently eats larger portions than normal and she struggles with emotional eating.  Depression Screen Megan Whitney's Food and Mood (modified PHQ-9) score was 24.  Depression screen Megan Whitney 2/9 03/02/2020  Decreased Interest 3  Down, Depressed, Hopeless 3  PHQ - 2 Score 6  Altered sleeping 2  Tired, decreased energy 3    Change in appetite 3  Feeling bad or failure about yourself  3  Trouble concentrating 3  Moving slowly or fidgety/restless 3  Suicidal thoughts 1  PHQ-9 Score 24  Difficult doing work/chores Very difficult  Some recent data might be hidden   Subjective:   1. Other fatigue Megan Whitney reports daytime somnolence and admits to waking up still tired. Patent has a history of symptoms of daytime fatigue, morning fatigue, morning headache and snoring. Megan Whitney generally gets 7 or 8 hours of sleep per night, and states that she has poor quality sleep. Snoring is present. Apneic episodes are present. Epworth Sleepiness Score is 17.  2. Shortness of breath on exertion Megan Whitney notes increasing shortness of breath with exercising and seems to be worsening over time with weight gain. She notes getting out of breath sooner with activity than she used to. This has gotten worse recently. Megan Whitney denies shortness of breath at rest or orthopnea.  3. Elevated fasting blood sugar Megan Whitney has no history of diabetes mellitus.  Positive strong family history.  She reports increased hunger at night on some days.  4. Other hyperlipidemia Megan Whitney has hyperlipidemia and has been trying to improve her cholesterol levels with intensive lifestyle modification including a low saturated fat diet, exercise and weight loss. She denies any chest pain, claudication or myalgias.  She is on medication with no side effects.  Tolerating well.  Lab Results  Component Value Date   ALT 13 01/19/2020   AST 20 01/19/2020   ALKPHOS 65 01/19/2020   BILITOT 0.4 01/19/2020  Lab Results  Component Value Date   CHOL 241 (H) 04/04/2019   HDL 77.80 04/04/2019   LDLCALC 142 (H) 04/04/2019   TRIG 106.0 04/04/2019   CHOLHDL 3 04/04/2019   5. Gastroesophageal reflux disease without esophagitis She sees Megan Whitney in GI.  She says she is taking her medication daily.  Symptoms are currently stable.  6. Closed nondisplaced fracture of pelvis with  routine healing, unspecified part of pelvis, subsequent encounter Megan Whitney is getting a bone density test in July.  Denies history of osteoporosis.  7. Severe anemia Megan Whitney takes an iron supplement daily.  She has a history of several transfusions in the past.  8. Bipolar 1 disorder (Megan Whitney) Megan Whitney sees Megan Whitney regularly for medication management.  She also sees a Megan Whitney regularly.    39.  Depression screening Megan Whitney was screened for depression as part of her new patient workup today.  PHQ-9 is 24, Denies new sx or SI when asked.  10. At risk for diabetes mellitus Megan Whitney is at higher than average risk for developing diabetes due to her obesity and history of elevated fasting blood glucose.  She has never had an insulin level, etc.  Denies history of diabetes mellitus personally.  Assessment/Plan:   1. Other fatigue Megan Whitney does feel that her weight is causing her energy to be lower than it should be. Fatigue may be related to obesity, depression or many other causes. Labs will be ordered, and in the meanwhile, Megan Whitney will focus on self care including making healthy food choices, increasing physical activity and focusing on stress reduction. - IC - ECG - Comprehensive metabolic panel - CBC with Differential/Platelet - Vitamin B12 - TSH - T3, free - T4, free  2. Shortness of breath on exertion Megan Whitney does feel that she gets out of breath more easily that she used to when she exercises. Megan Whitney shortness of breath appears to be obesity related and exercise induced. She has agreed to work on weight loss and gradually increase exercise to treat her exercise induced shortness of breath. Will continue to monitor closely. - VITAMIN D 25 Hydroxy (Vit-D Deficiency, Fractures) - Vitamin B12  3. Elevated fasting blood sugar Check labs, prudent nutritional plan, weight loss. - Hemoglobin A1c - Insulin, random  4. Other hyperlipidemia Check labs, prudent nutritional plan, weight loss.   Continue medications per PCP.  Will monitor alongside them. - Lipid panel  5. Gastroesophageal reflux disease without esophagitis Check labs, prudent nutritional plan, weight loss.  Medications per specialist.  Intensive lifestyle modifications are the first line treatment for this issue. We discussed several lifestyle modifications today and she will continue to work on diet, exercise and weight loss efforts. Orders and follow up as documented in patient record.   Counseling . If a person has gastroesophageal reflux disease (GERD), food and stomach acid move back up into the esophagus and cause symptoms or problems such as damage to the esophagus. . Anti-reflux measures include: raising the head of the bed, avoiding tight clothing or belts, avoiding eating late at night, not lying down shortly after mealtime, and achieving weight loss. . Avoid ASA, NSAID's, caffeine, alcohol, and tobacco.  . OTC Pepcid and/or Tums are often very helpful for as needed use.  Megan Whitney Kitchen However, for persisting chronic or daily symptoms, stronger medications like Omeprazole may be needed. . You may need to avoid foods and drinks such as: ? Coffee and tea (with or without caffeine). ? Drinks that contain alcohol. ? Energy drinks and sports  drinks. ? Bubbly (carbonated) drinks or sodas. ? Chocolate and cocoa. ? Peppermint and mint flavorings. ? Garlic and onions. ? Horseradish. ? Spicy and acidic foods. These include peppers, chili powder, curry powder, vinegar, hot sauces, and BBQ sauce. ? Citrus fruit juices and citrus fruits, such as oranges, lemons, and limes. ? Tomato-based foods. These include red sauce, chili, salsa, and pizza with red sauce. ? Fried and fatty foods. These include donuts, french fries, potato chips, and high-fat dressings. ? High-fat meats. These include hot dogs, rib eye steak, sausage, ham, and bacon.  6. Closed nondisplaced fracture of pelvis with routine healing, unspecified part of pelvis,  subsequent encounter Check labs, prudent nutritional plan, weight loss.  7. Severe anemia Check labs including anemia panel, ferritin, etc.  Continue prudent nutritional plan, weight loss.  8. Bipolar 1 disorder [facility]) Megan Whitney requests referral to Dr. Mallie Mussel for emotional eating as well.  Patient was referred to Dr. Mallie Mussel, our Bariatric Psychologist, for evaluation due to her elevated PHQ-9 score and significant struggles with emotional eating.  9. Depression screening Megan Whitney had a positive depression screening. Depression is commonly associated with obesity and often results in emotional eating behaviors. We will monitor this closely and work on CBT to help improve the non-hunger eating patterns.   10. At risk for diabetes mellitus Megan Whitney was given approximately 15 minutes of diabetes education and counseling today. We discussed intensive lifestyle modifications today with an emphasis on weight loss as well as increasing exercise and decreasing simple carbohydrates in her diet. We also reviewed medication options with an emphasis on risk versus benefit of those discussed.   Repetitive spaced learning was employed today to elicit superior memory formation and behavioral change.  11. Class 2 severe obesity with serious comorbidity and body mass index (BMI) of 36.0 to 36.9 in adult, unspecified obesity type [facility]) Megan Whitney is currently in the action stage of change and her goal is to continue with weight loss efforts. I recommend Simone begin the structured treatment plan as follows:  She has agreed to the Category 1 Plan.  Exercise goals: As is.   Behavioral modification strategies: increasing lean protein intake, decreasing simple carbohydrates and increasing water intake.  She was informed of the importance of frequent follow-up visits to maximize her success with intensive lifestyle modifications for her multiple health conditions. She was informed we would discuss her lab results at her next  visit unless there is a critical issue that needs to be addressed sooner. Dietra agreed to keep her next visit at the agreed upon time to discuss these results.  Objective:   Blood pressure 118/76, pulse 78, temperature 98 F (36.7 C), temperature source Oral, height 5' (1.524 m), weight 188 lb (85.3 kg), SpO2 97 %. Body mass index is 36.72 kg/m.  Indirect Calorimeter completed today shows a VO2 of 164 and a REE of 1144.  Her calculated basal metabolic rate is 4970 thus her basal metabolic rate is worse than expected.  General: Cooperative, alert, well developed, in no acute distress. HEENT: Conjunctivae and lids unremarkable. Cardiovascular: Regular rhythm.  Lungs: Normal work of breathing. Neurologic: No focal deficits.   Lab Results  Component Value Date   CREATININE 0.75 01/19/2020   BUN 16 01/19/2020   NA 141 01/19/2020   K 4.2 01/19/2020   CL 107 01/19/2020   CO2 26 01/19/2020   Lab Results  Component Value Date   ALT 13 01/19/2020   AST 20 01/19/2020   ALKPHOS 65 01/19/2020  BILITOT 0.4 01/19/2020   Lab Results  Component Value Date   HGBA1C 5.6 04/04/2019   Lab Results  Component Value Date   TSH 1.68 04/04/2019   Lab Results  Component Value Date   CHOL 241 (H) 04/04/2019   HDL 77.80 04/04/2019   LDLCALC 142 (H) 04/04/2019   TRIG 106.0 04/04/2019   CHOLHDL 3 04/04/2019   Lab Results  Component Value Date   WBC 7.4 01/19/2020   HGB 12.7 01/19/2020   HCT 39.6 01/19/2020   MCV 94.5 01/19/2020   PLT 250 01/19/2020   Lab Results  Component Value Date   IRON <10 (L) 05/14/2012   TIBC Not calculated due to Iron <10. 05/14/2012   FERRITIN 2 (L) 05/14/2012   Attestation Statements:   Reviewed by clinician on day of visit: allergies, medications, problem list, medical history, surgical history, family history, social history, and previous encounter notes.  I, Water quality scientist, CMA, am acting as [place] for [employer], DO.  I have reviewed  the above documentation for accuracy and completeness, and I agree with the above. [name], DO

## 2020-03-03 ENCOUNTER — Encounter (INDEPENDENT_AMBULATORY_CARE_PROVIDER_SITE_OTHER): Payer: Self-pay | Admitting: Family Medicine

## 2020-03-03 LAB — COMPREHENSIVE METABOLIC PANEL
ALT: 8 IU/L (ref 0–32)
AST: 18 IU/L (ref 0–40)
Albumin/Globulin Ratio: 2 (ref 1.2–2.2)
Albumin: 4.3 g/dL (ref 3.8–4.8)
Alkaline Phosphatase: 88 IU/L (ref 48–121)
BUN/Creatinine Ratio: 20 (ref 12–28)
BUN: 15 mg/dL (ref 8–27)
Bilirubin Total: 0.2 mg/dL (ref 0.0–1.2)
CO2: 22 mmol/L (ref 20–29)
Calcium: 9.4 mg/dL (ref 8.7–10.3)
Chloride: 103 mmol/L (ref 96–106)
Creatinine, Ser: 0.76 mg/dL (ref 0.57–1.00)
GFR calc Af Amer: 96 mL/min/{1.73_m2} (ref >59)
GFR calc non Af Amer: 83 mL/min/{1.73_m2} (ref >59)
Globulin, Total: 2.1 g/dL (ref 1.5–4.5)
Glucose: 83 mg/dL (ref 65–99)
Potassium: 4.5 mmol/L (ref 3.5–5.2)
Sodium: 139 mmol/L (ref 134–144)
Total Protein: 6.4 g/dL (ref 6.0–8.5)

## 2020-03-03 LAB — LIPID PANEL
Chol/HDL Ratio: 2.6 ratio (ref 0.0–4.4)
Cholesterol, Total: 238 mg/dL — ABNORMAL HIGH (ref 100–199)
HDL: 90 mg/dL (ref >39)
LDL Chol Calc (NIH): 135 mg/dL — ABNORMAL HIGH (ref 0–99)
Triglycerides: 77 mg/dL (ref 0–149)
VLDL Cholesterol Cal: 13 mg/dL (ref 5–40)

## 2020-03-03 LAB — ANEMIA PANEL
Ferritin: 57 ng/mL (ref 15–150)
Folate, Hemolysate: 342 ng/mL
Folate, RBC: 847 ng/mL (ref >498)
Hematocrit: 40.4 % (ref 34.0–46.6)
Iron Saturation: 36 % (ref 15–55)
Iron: 114 ug/dL (ref 27–139)
Retic Ct Pct: 1.5 % (ref 0.6–2.6)
Total Iron Binding Capacity: 318 ug/dL (ref 250–450)
UIBC: 204 ug/dL (ref 118–369)
Vitamin B-12: 376 pg/mL (ref 232–1245)

## 2020-03-03 LAB — CBC WITH DIFFERENTIAL/PLATELET
Basophils Absolute: 0 10*3/uL (ref 0.0–0.2)
Basos: 0 %
EOS (ABSOLUTE): 0.2 10*3/uL (ref 0.0–0.4)
Eos: 3 %
Hemoglobin: 13.7 g/dL (ref 11.1–15.9)
Immature Grans (Abs): 0 10*3/uL (ref 0.0–0.1)
Immature Granulocytes: 1 %
Lymphocytes Absolute: 1.6 10*3/uL (ref 0.7–3.1)
Lymphs: 27 %
MCH: 31.2 pg (ref 26.6–33.0)
MCHC: 33.9 g/dL (ref 31.5–35.7)
MCV: 92 fL (ref 79–97)
Monocytes Absolute: 0.6 10*3/uL (ref 0.1–0.9)
Monocytes: 10 %
Neutrophils Absolute: 3.5 10*3/uL (ref 1.4–7.0)
Neutrophils: 59 %
Platelets: 219 10*3/uL (ref 150–450)
RBC: 4.39 x10E6/uL (ref 3.77–5.28)
RDW: 12.6 % (ref 11.7–15.4)
WBC: 6 10*3/uL (ref 3.4–10.8)

## 2020-03-03 LAB — TSH: TSH: 2.83 u[IU]/mL (ref 0.450–4.500)

## 2020-03-03 LAB — INSULIN, RANDOM: INSULIN: 4.7 u[IU]/mL (ref 2.6–24.9)

## 2020-03-03 LAB — HEMOGLOBIN A1C
Est. average glucose Bld gHb Est-mCnc: 103 mg/dL
Hgb A1c MFr Bld: 5.2 % (ref 4.8–5.6)

## 2020-03-03 LAB — T3, FREE: T3, Free: 3 pg/mL (ref 2.0–4.4)

## 2020-03-03 LAB — FOLATE: Folate: 10.2 ng/mL (ref >3.0)

## 2020-03-03 LAB — VITAMIN D 25 HYDROXY (VIT D DEFICIENCY, FRACTURES): Vit D, 25-Hydroxy: 52.6 ng/mL (ref 30.0–100.0)

## 2020-03-03 LAB — T4, FREE: Free T4: 0.99 ng/dL (ref 0.82–1.77)

## 2020-03-05 ENCOUNTER — Other Ambulatory Visit: Payer: Self-pay

## 2020-03-05 ENCOUNTER — Ambulatory Visit (INDEPENDENT_AMBULATORY_CARE_PROVIDER_SITE_OTHER): Payer: BC Managed Care – PPO | Admitting: Psychiatry

## 2020-03-05 DIAGNOSIS — F319 Bipolar disorder, unspecified: Secondary | ICD-10-CM | POA: Diagnosis not present

## 2020-03-05 NOTE — Telephone Encounter (Signed)
I gave this to Marcie Bal to yesterday.

## 2020-03-05 NOTE — Progress Notes (Signed)
      [facility] Counselor/Therapist Progress Note  Patient ID: Megan Whitney, MRN: [id number],    Date: 03/05/2020  Time Spent: 60 minutes   4:00pm to 5:00pm  Treatment Type: Individual Therapy  Reported Symptoms:  Anxiety, anger, frustration, irritability, some depression (states I was worse several days ago but some better today.    Mental Status Exam:  Appearance:   Neat     Behavior:  Appropriate, Sharing and Motivated  Motor:  Normal  Speech/Language:   Clear and Coherent  Affect:  anxious, mild depression  Mood:  anxious and mild depression  Thought process:  normal  Thought content:    WNL  Sensory/Perceptual disturbances:    WNL  Orientation:  oriented to person, place, time/date, situation, day of week, month of year and year  Attention:  Good  Concentration:  Good and Fair  Memory:  forgetfulness "at times and worse under stress"  Fund of knowledge:   Good  Insight:    Good and Fair  Judgment:   Good  Impulse Control:  Good and Fair   Risk Assessment: Danger to Self:  No Self-injurious Behavior: No Danger to Others: No Duty to Warn:no Physical Aggression / Violence:No  Access to Firearms a concern: No  Gang Involvement:No   Subjective: Patient today reports anxiety, irritability, some depression, anger.   Interventions: Cognitive Behavioral Therapy and Solution-Oriented/Positive Psychology  Diagnosis:   ICD-10-CM   1. Bipolar I disorder ([place])  F31.9      Plan: Patient not signing tx plan on computer screen due to [place].  Treatment Goals: Goals remain on plan as patient works on strategies to meet her goals. Progress is noted each visit in "Progress" section of goal plan.  Long Term Goal: Reduce overall level, frequency, and intensity of the anxiety so that daily functioning is not impaired.  Short Term Goal: 1.Increase understanding of the beliefs and messages that produce the worry and anxiety.  Strategies: 1.Help client develop  reality-based positive cognitive messages. 2. Develop a "coping card" or other reminder which coping strategies are recorded for patient's later use .  PROGRESS: Patient in today reporting "I was worse several days ago but doing some better today."  Symptoms include anxiety, some depression (mild), anger (frustrated with people "when they're inconsiderate or driving crazy on the roads").  Also some irritability. Today her mood is some better and pleasant.  Shares that she went to [facility] program to help facilitate weight loss and feels encouraged.  Today needing to talk through some apprehension re: upcoming retirement decision. Processes some of her anxiety, doubt, but also some relief and "positive anticipation."  Normalized her mixed feelings about this with patient. States that she anticipates there being some more mixed feelings the closer she gets to retirement. Reviewed calming and anxiety reduction strategies with patient and encouraged her to focus on the things she can control versus not control, looking intentionally for what may go right verus wrong, and stay in the present versus going to the past or jumping too much into the future as that usually results in her "worrying about what may happen."  Goal review and progress noted with patient.  Next appt within 2-3 weeks.   Shanon Ace, LCSW

## 2020-03-05 NOTE — Telephone Encounter (Signed)
Please advise 

## 2020-03-05 NOTE — Telephone Encounter (Signed)
You're awesome! Thanks!

## 2020-03-06 ENCOUNTER — Ambulatory Visit: Payer: BC Managed Care – PPO | Admitting: Psychiatry

## 2020-03-06 ENCOUNTER — Encounter (INDEPENDENT_AMBULATORY_CARE_PROVIDER_SITE_OTHER): Payer: Self-pay | Admitting: Family Medicine

## 2020-03-10 NOTE — Telephone Encounter (Signed)
FYI

## 2020-03-11 ENCOUNTER — Ambulatory Visit: Payer: BC Managed Care – PPO | Admitting: Psychiatry

## 2020-03-11 ENCOUNTER — Other Ambulatory Visit: Payer: Self-pay

## 2020-03-11 DIAGNOSIS — F319 Bipolar disorder, unspecified: Secondary | ICD-10-CM

## 2020-03-11 NOTE — Progress Notes (Signed)
[facility] Counselor/Therapist Progress Note  Patient ID: Megan Whitney, MRN: [id number],    Date: 03/11/2020  Time Spent: 60 minutes  5:00pm to 6:00pm   Treatment Type: Individual Therapy  Reported Symptoms: patient states anxiety was "through the roof" yesterday but is better today, depression,worries.  Mental Status Exam:  Appearance:   Casual     Behavior:  Appropriate and Sharing  Motor:  Normal  Speech/Language:   Clear and Coherent  Affect:  anxious, some depression  Mood:  anxious and some depression  Thought process:  normal  Thought content:    WNL  Sensory/Perceptual disturbances:    WNL  Orientation:  oriented to person, place, time/date, situation, day of week, month of year and year  Attention:  Good  Concentration:  Fair  Memory:  some forgetfulness worse under stress  Fund of knowledge:   Good  Insight:    Good and Fair  Judgment:   Good and Fair  Impulse Control:  Good and Fair   Risk Assessment: Danger to Self:  No Self-injurious Behavior: No Danger to Others: No Duty to Warn:no Physical Aggression / Violence:No  Access to Firearms a concern: No  Gang Involvement:No   Subjective: Patient today reporting symptoms of anxiety, some depression, worrying.  Had "anxiety through the roof last night over a situation that was pretty quickly diffused and she felt was definitely situational and not a manic episode."  Interventions: Cognitive Behavioral Therapy and Solution-Oriented/Positive Psychology  Diagnosis:   ICD-10-CM   1. Bipolar I disorder ([place])  F31.9      Plan: Patient not signing tx plan on computer screen due to [place].  Treatment Goals: Goals remain on plan as patient works on strategies to meet her goals. Progress is noted each visit in "Progress" section of goal plan.  Long Term Goal: Reduce overall level, frequency, and intensity of the anxiety so that daily functioning is not impaired.  Short Term Goal: 1.Increase  understanding of the beliefs and messages that produce the worry and anxiety.  Strategies: 1.Help client develop reality-based positive cognitive messages/self-talk. 2. Develop a "coping card" or other reminder which coping strategies are recorded for patient's later use .  PROGRESS: Patient today reporting a "bad episode last night" when she heard her SS check may be delayed starting til November and she is set to retire end of this July. Said she got very upset over this last night, "hollering and crying", but eventually calmed down and has remained calm today. States I wasn't manic yesterday, my day had been fine until I got home and heard about the SS check, and then "went off" but did rebound and calmed down. Has learned updated info that only 4 months will be affected they think at this point.  Is feeling good about her signing up for weight management program and had been following recommendations thus far. Has also joined a Bible study group that starts in a few weeks. Retirement is still presenting some anxiety for patient and she talked through her anxieties today, realizing after processing them that they are not very grounded in reality, and that some anxiety about retirement is very common amongst other people. Encouraged her to stay in the present for now and not jump too far ahead and that increases her worries, and to also include good self-care in every day (including emotional and physical care) including developing more positive self-talk (per strategy above).  Goal review and progress noted with patient.  Next  appt within 2 weeks.   Shanon Ace, LCSW

## 2020-03-16 ENCOUNTER — Ambulatory Visit (INDEPENDENT_AMBULATORY_CARE_PROVIDER_SITE_OTHER): Payer: BC Managed Care – PPO | Admitting: Family Medicine

## 2020-03-16 ENCOUNTER — Encounter (INDEPENDENT_AMBULATORY_CARE_PROVIDER_SITE_OTHER): Payer: Self-pay | Admitting: Family Medicine

## 2020-03-16 ENCOUNTER — Other Ambulatory Visit: Payer: Self-pay

## 2020-03-16 VITALS — BP 109/78 | HR 98 | Temp 98.5°F | Ht 60.0 in | Wt 179.0 lb

## 2020-03-16 DIAGNOSIS — R7301 Impaired fasting glucose: Secondary | ICD-10-CM | POA: Diagnosis not present

## 2020-03-16 DIAGNOSIS — E7849 Other hyperlipidemia: Secondary | ICD-10-CM

## 2020-03-16 DIAGNOSIS — Z862 Personal history of diseases of the blood and blood-forming organs and certain disorders involving the immune mechanism: Secondary | ICD-10-CM

## 2020-03-16 DIAGNOSIS — E559 Vitamin D deficiency, unspecified: Secondary | ICD-10-CM

## 2020-03-16 DIAGNOSIS — E669 Obesity, unspecified: Secondary | ICD-10-CM

## 2020-03-16 DIAGNOSIS — Z9189 Other specified personal risk factors, not elsewhere classified: Secondary | ICD-10-CM | POA: Diagnosis not present

## 2020-03-16 DIAGNOSIS — F319 Bipolar disorder, unspecified: Secondary | ICD-10-CM

## 2020-03-16 DIAGNOSIS — Z6835 Body mass index (BMI) 35.0-35.9, adult: Secondary | ICD-10-CM

## 2020-03-16 NOTE — Progress Notes (Signed)
Chief Complaint:   OBESITY Megan Whitney is here to discuss her progress with her obesity treatment plan along with follow-up of her obesity related diagnoses. Megan Whitney is on the Category 1 Plan and states she is following her eating plan approximately 100% of the time. Megan Whitney states she is exercising for 0 minutes 0 times per week.  Today's visit was #: 2 Starting weight: 188 lbs Starting date: 6/28/021 Today's weight: 179 lbs Today's date: 03/17/2020 Total lbs lost to date: 9 lbs Total lbs lost since last in-office visit: 9 lbs  Interim History: Megan Whitney says that at first she felt starved, but then later, over the next couple of days, she was not hungry.  Towards the end of the 2 weeks, she had a decreased appetite and was not looking for extra food.  She has been snaking on fruit and yogurt.  She was craving sweets at first, but cravings are now gone.  Her bipolar disorder is also more stable with less mood swings.  Subjective:   1. Elevated fasting blood sugar Discussed labs with patient today.  A1c and fasting insulin level are reassuring.  Lab Results  Component Value Date   HGBA1C 5.2 03/02/2020   HGBA1C 5.6 04/04/2019   Lab Results  Component Value Date   LDLCALC 135 (H) 03/02/2020   CREATININE 0.76 03/02/2020   Lab Results  Component Value Date   INSULIN 4.7 03/02/2020   2. Other hyperlipidemia Discussed labs with patient today.  Megan Whitney has hyperlipidemia and has been trying to improve her cholesterol levels with intensive lifestyle modification including a low saturated fat diet, exercise and weight loss. She denies any chest pain, claudication or myalgias.  On Lipitor now.    Lab Results  Component Value Date   ALT 8 03/02/2020   AST 18 03/02/2020   ALKPHOS 88 03/02/2020   BILITOT 0.2 03/02/2020   Lab Results  Component Value Date   CHOL 238 (H) 03/02/2020   HDL 90 03/02/2020   LDLCALC 135 (H) 03/02/2020   TRIG 77 03/02/2020   CHOLHDL 2.6 03/02/2020   3. H/O  iron deficiency anemia Discussed labs with patient today.  Megan Whitney is not a vegetarian.  She does not have a history of weight loss surgery.   CBC Latest Ref Rng & Units 03/02/2020 01/19/2020 04/04/2019  WBC 3.4 - 10.8 x10E3/uL 6.0 7.4 7.7  Hemoglobin 11.1 - 15.9 g/dL 13.7 12.7 13.9  Hematocrit 34.0 - 46.6 % 40.4 39.6 41.7  Platelets 150 - 450 x10E3/uL 219 250 242.0   Lab Results  Component Value Date   IRON 114 03/02/2020   TIBC 318 03/02/2020   FERRITIN 57 03/02/2020   Lab Results  Component Value Date   VITAMINB12 376 03/02/2020   4. Vitamin D deficiency Discussed labs with patient today.  Megan Whitney's vitamin D level was 52.6 on 03/02/2020. She is currently taking OTC vitamin D 1000 IU each day. She denies nausea, vomiting or muscle weakness.  Megan Whitney has history of fracture of the pelvis secondary to fall.  Her psychiatrist had her on high dose vitamin D in the past, but her levels were so high that there was concern for "toxicity".  She is now only taking 1000 IU daily.  5. Bipolar disorder, rapid cycling ([place]) Discussed labs with patient today.  Now improved mood.  Stability with current nutritional plan.   6. At risk for constipation Megan Whitney is at increased risk for constipation due to summertime with increased temperatures, changes to diet.  Cautioned her regarding the importance of adequate water intake.  Assessment/Plan:   1. Elevated fasting blood sugar Good blood sugar control is important to decrease the likelihood of diabetic complications such as nephropathy, neuropathy, limb loss, blindness, coronary artery disease, and death. Intensive lifestyle modification including diet, exercise and weight loss are the first line of treatment.  Continue prudent nutritional plan, weight loss.   2. Other hyperlipidemia Cardiovascular risk and specific lipid/LDL goals reviewed.  We discussed several lifestyle modifications today and Megan Whitney will continue to work on diet, exercise and weight loss  efforts. Orders and follow up as documented in patient record.  Recheck in 3-4 months after continuing prudent nutritional plan, weight loss.  Counseling Intensive lifestyle modifications are the first line treatment for this issue. . Dietary changes: Increase soluble fiber. Decrease simple carbohydrates. . Exercise changes: Moderate to vigorous-intensity aerobic activity 150 minutes per week if tolerated. . Lipid-lowering medications: see documented in medical record.  3. H/O iron deficiency anemia Continue supplementation, labs stable.  Continue prudent nutritional plan.  Orders and follow up as documented in patient record.  Counseling . Iron is essential for our bodies to make red blood cells.  Reasons that someone may be deficient include: an iron-deficient diet (more likely in those following vegan or vegetarian diets), women with heavy menses, patients with GI disorders or poor absorption, patients that have had bariatric surgery, frequent blood donors, patients with cancer, and patients with heart disease.   Megan Whitney foods include dark leafy greens, red and white meats, eggs, seafood, and beans.   . Certain foods and drinks prevent your body from absorbing iron properly. Avoid eating these foods in the same meal as iron-rich foods or with iron supplements. These foods include: coffee, black tea, and red wine; milk, dairy products, and foods that are high in calcium; beans and soybeans; whole grains.  . Constipation can be a side effect of iron supplementation. Increased water and fiber intake are helpful. Water goal: > 2 liters/day. Fiber goal: > 25 grams/day.  4. Vitamin D deficiency Low Vitamin D level contributes to fatigue and are associated with obesity, breast, and colon cancer. She agrees to continue to take OTC Vitamin D @1 ,000 IU daily and will follow-up for routine testing of Vitamin D, at least 2-3 times per year to avoid over-replacement.  Levels are within normal limits and  ideal.  Continue supplement. Continue prudent nutritional plan and weight loss.  5. Bipolar disorder, rapid cycling (Megan Whitney) Continue current treatment and healthier eating habits.  6. At risk for constipation Megan Whitney was given approximately 15 minutes of counseling today regarding prevention of constipation. She was encouraged to increase water and fiber intake.   7. Class 2 obesity without serious comorbidity with body mass index (BMI) of 35.0 to 35.9 in adult, unspecified obesity type Megan Whitney is currently in the action stage of change. As such, her goal is to continue with weight loss efforts. She has agreed to the Category 1 Plan.   Exercise goals: As is.  Behavioral modification strategies: increasing lean protein intake, increasing water intake and planning for success, making sure to weigh foods and protein.  Megan Whitney has agreed to follow-up with our clinic in 2 weeks. She was informed of the importance of frequent follow-up visits to maximize her success with intensive lifestyle modifications for her multiple health conditions.   Objective:   Blood pressure 109/78, pulse 98, temperature 98.5 F (36.9 C), temperature source Oral, height 5' (1.524 m), weight 179 lb (81.2  kg), SpO2 97 %. Body mass index is 34.96 kg/m.  General: Cooperative, alert, well developed, in no acute distress. HEENT: Conjunctivae and lids unremarkable. Cardiovascular: Regular rhythm.  Lungs: Normal work of breathing. Neurologic: No focal deficits.   Lab Results  Component Value Date   CREATININE 0.76 03/02/2020   BUN 15 03/02/2020   NA 139 03/02/2020   K 4.5 03/02/2020   CL 103 03/02/2020   CO2 22 03/02/2020   Lab Results  Component Value Date   ALT 8 03/02/2020   AST 18 03/02/2020   ALKPHOS 88 03/02/2020   BILITOT 0.2 03/02/2020   Lab Results  Component Value Date   HGBA1C 5.2 03/02/2020   HGBA1C 5.6 04/04/2019   Lab Results  Component Value Date   INSULIN 4.7 03/02/2020   Lab Results    Component Value Date   TSH 2.830 03/02/2020   Lab Results  Component Value Date   CHOL 238 (H) 03/02/2020   HDL 90 03/02/2020   LDLCALC 135 (H) 03/02/2020   TRIG 77 03/02/2020   CHOLHDL 2.6 03/02/2020   Lab Results  Component Value Date   WBC 6.0 03/02/2020   HGB 13.7 03/02/2020   HCT 40.4 03/02/2020   MCV 92 03/02/2020   PLT 219 03/02/2020   Lab Results  Component Value Date   IRON 114 03/02/2020   TIBC 318 03/02/2020   FERRITIN 57 03/02/2020   Attestation Statements:   Reviewed by clinician on day of visit: allergies, medications, problem list, medical history, surgical history, family history, social history, and previous encounter notes.  I, Water quality scientist, CMA, am acting as Location manager for [employer], DO.  I have reviewed the above documentation for accuracy and completeness, and I agree with the above. [name], DO

## 2020-03-16 NOTE — Telephone Encounter (Signed)
Please advise. Thanks.  

## 2020-03-16 NOTE — Patient Instructions (Signed)
The 10-year ASCVD risk score Mikey Bussing DC Brooke Bonito., et al., 2013) is: 3.2%   Values used to calculate the score:     Age: 64 years     Sex: Female     Is Non-Hispanic African American: No     Diabetic: No     Tobacco smoker: No     Systolic Blood Pressure: 282 mmHg     Is BP treated: No     HDL Cholesterol: 90 mg/dL     Total Cholesterol: 238 mg/dL

## 2020-03-20 ENCOUNTER — Ambulatory Visit: Payer: BC Managed Care – PPO | Admitting: Psychiatry

## 2020-03-20 ENCOUNTER — Ambulatory Visit (INDEPENDENT_AMBULATORY_CARE_PROVIDER_SITE_OTHER): Payer: BC Managed Care – PPO | Admitting: Psychiatry

## 2020-03-20 ENCOUNTER — Other Ambulatory Visit: Payer: Self-pay

## 2020-03-20 DIAGNOSIS — F319 Bipolar disorder, unspecified: Secondary | ICD-10-CM | POA: Diagnosis not present

## 2020-03-20 NOTE — Progress Notes (Signed)
[facility] Counselor/Therapist Progress Note  Patient ID: Megan Whitney, MRN: [id number],    Date: 03/20/2020  Time Spent: 60 minutes  8:00am to 9:00am  Treatment Type: Individual Therapy  Reported Symptoms: anxiety, depression "both of which are some worse"  Mental Status Exam:  Appearance:   Casual     Behavior:  Appropriate and Sharing  Motor:  Normal  Speech/Language:   Clear and Coherent  Affect:  anxiety, depression increased  Mood:  anxious and depressed  Thought process:  goal directed  Thought content:    WNL and some obsessiveness  Sensory/Perceptual disturbances:    WNL  Orientation:  oriented to person, place, time/date, situation, day of week, month of year and year  Attention:  Good  Concentration:  Good  Memory:  WNL  Fund of knowledge:   Good  Insight:    Good and Fair  Judgment:   Good  Impulse Control:  Good   Risk Assessment: Danger to Self:  States that she did havd some SI Monday after work due to feeling "less than".  Denies any further SI since that day. Self-injurious Behavior: No Danger to Others: No Duty to Warn:no Physical Aggression / Violence:No  Access to Firearms a concern: No  Gang Involvement:No   Subjective: Patient in today with one episode of SI on Monday of this week but no intent.  That came after her siting in on a meeting at work and was feeling "less than others".  Has not had any further SI since that day.  Symptoms include increased anxiety and depression.  Interventions: Cognitive Behavioral Therapy and Solution-Oriented/Positive Psychology  Diagnosis:   ICD-10-CM   1. Bipolar I disorder ([place])  F31.9      Plan: Patient not signing tx plan on computer screen due to [place].  Treatment Goals: Goals remain on plan as patient works on strategies to meet her goals. Progress is noted each visit in "Progress" section of goal plan.  Long Term Goal: Reduce overall level, frequency, and intensity of the anxiety  so that daily functioning is not impaired.  Short Term Goal: 1.Increase understanding of the beliefs and messages that produce the worry and anxiety.  Strategies: 1.Help client develop reality-based positive cognitive messages/self-talk. 2. Develop a "coping card" or other reminder which coping strategies are recorded for patient's later use .  PROGRESS: Patient reports some increase in anxiety and depression mostly due to work issues negatively affecting her personal thoughts and feelings about herself. Talked through these triggers today and patient seemed to realize that her thoughts are not accurate and it is part of her self-negating and feeling "less than other people."  Also admits to an episode of SI on Monday after negative feelings at work but "did stay at work."  States she has not had any further SI since that day and "I know it was due to my feeling inadequate.". Shares that today after talking she is feeling more "adequate and not as inferior to other people.  On 1-10 scale of anxiety, states she was an "8" earlier this week but currently feels a "5" .  On depression scale of 1-10  She rates herself a "10" on Monday but is a "4" now with SI.  To continue between sessions working on her self esteem and on recognizing triggers and confronting them versus assuming the worst or overpersonalizing in a negative way.  Reminded how to get emergent services after hours if SI should return.  Goal review  and progress noted with patient.  Next appt within 2 weeks.   Shanon Ace, LCSW

## 2020-03-24 ENCOUNTER — Other Ambulatory Visit: Payer: Self-pay | Admitting: Psychiatry

## 2020-03-24 DIAGNOSIS — F319 Bipolar disorder, unspecified: Secondary | ICD-10-CM

## 2020-03-31 ENCOUNTER — Other Ambulatory Visit: Payer: Self-pay

## 2020-03-31 ENCOUNTER — Ambulatory Visit (INDEPENDENT_AMBULATORY_CARE_PROVIDER_SITE_OTHER): Payer: BC Managed Care – PPO | Admitting: Psychiatry

## 2020-03-31 DIAGNOSIS — F319 Bipolar disorder, unspecified: Secondary | ICD-10-CM | POA: Diagnosis not present

## 2020-03-31 NOTE — Progress Notes (Signed)
      [facility] Counselor/Therapist Progress Note  Patient ID: Megan Whitney, MRN: [id number],    Date: 03/31/2020  Time Spent: 60 minutes   8:00am to 9:00am  Treatment Type: Individual Therapy  Reported Symptoms: depression "low grade", "severe anxiety", no motivation,  Mental Status Exam:  Appearance:   Neat     Behavior:  Appropriate and Sharing  Motor:  Normal  Speech/Language:   Clear and Coherent  Affect:  anxious, some depression  Mood:  anxious and depressed  Thought process:  goal directed  Thought content:    WNL  Sensory/Perceptual disturbances:    WNL  Orientation:  oriented to person, place, time/date, situation, day of week, month of year and year  Attention:  Fair  Concentration:  Fair  Memory:  forgetful "and worse under stress"  Fund of knowledge:   Good  Insight:    Fair  Judgment:   Fair  Impulse Control:  Fair   Risk Assessment: Danger to Self:  No Self-injurious Behavior: No Danger to Others: No Duty to Warn:no Physical Aggression / Violence:No  Access to Firearms a concern: No  Gang Involvement:No   Subjective:  Patient today reports increased anxiety, "low grade" depression, less motivation, self-negating  Interventions: Cognitive Behavioral Therapy and Solution-Oriented/Positive Psychology  Diagnosis:   ICD-10-CM   1. Bipolar I disorder with rapid cycling ([place])  F31.9     Plan: Patient not signing tx plan on computer screen due to [place].  Treatment Goals: Goals remain on plan as patient works on strategies to meet her goals. Progress is noted each visit in "Progress" section of goal plan.  Long Term Goal: Reduce overall level, frequency, and intensity of the anxiety so that daily functioning is not impaired.  Short Term Goal: 1.Increase understanding of the beliefs and messages that produce the worry and anxiety.  Strategies: 1.Help client develop reality-based positive cognitive messages/self-talk. 2. Develop a  "coping card" or other reminder which coping strategies are recorded for patient's later use .  PROGRESS: Patient in today reporting "low grade depression", increased anxiety, and less motivation. Denies any SI.  Some anxiety related to upcoming retirement which has been pushed back to Sept. 30, with her last week being a vacation week. Rates herself a "4" on 1-10 scale of depression.  Rates herself a "8-9" on 1-10 anxiety scale. Processed her anxious feelings about upcoming retirement and we worked together on some pointers that can help get her through stressful times over the remaining working days she has left.  Pointers included tips for best self-care and self-talk, recognizing triggers,  letting go, avoiding the words "I can't", looking for more of the positives than negatives, focusingon what's in her control and letting go of what's not in her control, intercepting her negative thoughts, and staying in the present versus the past or jumping into future worries.    Goal review and progress noted with patient.  Next appt within 2-3 weeks.  Shanon Ace, LCSW

## 2020-04-01 ENCOUNTER — Ambulatory Visit (INDEPENDENT_AMBULATORY_CARE_PROVIDER_SITE_OTHER): Payer: BC Managed Care – PPO | Admitting: Psychiatry

## 2020-04-01 ENCOUNTER — Encounter: Payer: Self-pay | Admitting: Psychiatry

## 2020-04-01 DIAGNOSIS — F900 Attention-deficit hyperactivity disorder, predominantly inattentive type: Secondary | ICD-10-CM

## 2020-04-01 DIAGNOSIS — G3184 Mild cognitive impairment, so stated: Secondary | ICD-10-CM | POA: Diagnosis not present

## 2020-04-01 DIAGNOSIS — G251 Drug-induced tremor: Secondary | ICD-10-CM

## 2020-04-01 DIAGNOSIS — F319 Bipolar disorder, unspecified: Secondary | ICD-10-CM

## 2020-04-01 DIAGNOSIS — F411 Generalized anxiety disorder: Secondary | ICD-10-CM | POA: Diagnosis not present

## 2020-04-01 DIAGNOSIS — R7989 Other specified abnormal findings of blood chemistry: Secondary | ICD-10-CM

## 2020-04-01 MED ORDER — DEXMETHYLPHENIDATE HCL 10 MG PO TABS: ORAL_TABLET | ORAL | 0 refills | Status: DC

## 2020-04-01 MED ORDER — LOXAPINE SUCCINATE 10 MG PO CAPS: 40.0000 mg | ORAL_CAPSULE | Freq: Every day | ORAL | 1 refills | Status: DC

## 2020-04-01 MED ORDER — DEXMETHYLPHENIDATE HCL ER 25 MG PO CP24: 1.0000 | ORAL_CAPSULE | ORAL | 0 refills | Status: DC

## 2020-04-01 NOTE — Progress Notes (Signed)
Megan Whitney [id number] 1955/10/07 64 y.o.     Subjective:   Patient ID:  Megan Whitney is a 64 y.o. (DOB August 30, 1956) female.   Chief Complaint:  Chief Complaint  Patient presents with  . Follow-up  . Anxiety  . Depression  . Medication Problem    Depression        Associated symptoms include no decreased concentration and no suicidal ideas.  Past medical history includes anxiety.   Anxiety Symptoms include nervous/anxious behavior. Patient reports no confusion, decreased concentration, dizziness, nausea or suicidal ideas.    Medication Refill Associated symptoms include arthralgias. Pertinent negatives include no nausea or weakness.   Megan Whitney is  follow-up of r chronic mood swings and anxiety and frequent changes in medications.   At visit December 27, 2018.  Focalin XR was increased from 20 mg to 25 mg daily to help with focus and attention and potentially mood.  When seen February 13, 2019.  In an effort to reduce mood cycling we reduce fluoxetine to 20 mg daily.  At visit August 2020.  No meds were changed.  She continued the following: Focalin XR 25 mg every morning and Focalin 10 mg immediate release daily Equetro 200 mg nightly Fluoxetine 20 mg daily Lamotrigine 200 mg twice daily Lithium 150 mg nightly Vraylar 3 mg daily  She called back November 4 after seeing her therapist stating that she was having some hypomanic symptoms with reduced sleep and increased energy.  This potentiality had been discussed and the decision was made to increase Equetro from 200 mg nightly to 300 mg nightly.  seen August 12, 2019.  Because of balance problems she did not tolerate Equetro 300 mg nightly and it was changed to Equetro 200 mg nightly plus immediate release carbamazepine 100 mg nightly.  Her mood had not been stable enough on Equetro 200 mg nightly alone. Less balance problems with change in CBZ.  seen September 23, 2019.  The following was changed: For bipolar  mixed increase CBZ IR to 200 mg HS.  Disc fall and balance risks.For bipolar mixed increase CBZ IR to 200 mg HS.  Disc fall and balance risks.  She called back October 23, 2019 stating she had had another fall and felt it was due to the medication.  Therefore carbamazepine immediate release was reduced from 200 mg nightly to 100 mg nightly.  The Equetro is unchanged.  Last seen November 04, 2019.  The following was noted:  Better at the moment but balance is still somewhat of a problem.  Started PT to help balance.  Had a fall after tripping on a curb and hit her head on sidewalk.  Got a concussion with nausea and HA and dizziness and light sensitivity.  Not over it.  Concentration problems.  Has gotten back to work after a week.   Mood sx pretty good with some mild depression.  Nothing severe.  Trying to minimize stress and self care as much as possible.  No manic sx lately and sleeping fairly well.  No racing thoughts.   Working another year and plans to retire but H alcoholic and not sure it will be good to be there all the time. Seeing therapist q 2 weeks.  Therapy helping . Recent serum vitamin D level was determined to be low at 33.  The goal and chronically depressed patient's is in the 50s if possible.  So her vitamin D was increased on August 08, 2018 or thereabouts.  Checked  vitamin D level again and this time it was high at 120 and so it was stopped.  She's restarted per PCP at 1000 units daily.  01/06/2020 appointment the following is noted: Still on: Focalin XR 25 mg every morning and Focalin 10 mg immediate release daily Equetro 200 mg nightly Carbamazepine immediate release 100 mg nightly Fluoxetine 20 mg daily Lamotrigine 200 mg twice daily Lithium 150 mg nightly Vraylar 3 mg daily Not good manic.  Angry.  Missed 2 days bc sx.  Last week vacation which didn't go well.  Crying last week and missed a day.  "Pissed off at the whole world" but also depressed and hard to get OOB today.   Everything makes me angry.   Blows up without control.  Then regrets it.  Sleep irregular lately. Finished PT which might have helped some but still balance problems. Plan: Cannot increase carbamazepine due to balance issues Temporarily Ativan for agitation 0.5 mg tablets  DT mania stop fluoxetine If fails trial loxapine  01/15/2020 patient called after hours with suicidal thoughts and patient was to go to the [facility]. Patient ultimately admitted to [facility] psychiatric unit.  Dr. Clovis Pu spoke with clinical pharmacist they are giving history of medication experience and recommendation for loxapine.  Patient hospital stay for 3 days and discharged on loxapine 10 mg nightly as the new medication.  02/10/2020 phone call patient complaining of insomnia.  Loxapine was increased from 10 to 20 mg nightly due to recent insomnia with mania.  02/14/2020 appointment with the following noted: Lately in tears Monday and Tuesday convinced she couldn't do her job.  Better last couple of days.  Motivation is not real good but not depressed like Monday and Tuesday. This week missing some meds bc couldn't get like Focalin.  Been taking other meds. No sig manic sx.  Sleep is better with more loxapine about 8 hours. Anxiety is chronic.  No SE loxapine so far unless a little dizzy here and there. No med changes.  02/25/2020 appointment urgently made after patient was recently hospitalized.  The following is noted: Unstable.  Today manic driving erratically.  Talking a mile a minute.  Not thinking clearly.  Angry.  Slept OK last night.  Hyperactive with poor productivity for a couple of days.  Weekend OK overall.   No falls lately. More tremor lately.  Retiring July 30.  Plan: For tremor amantadine 100 mg twice a day if needed. Increase loxapine to 3 capsules 1 to 2 hours before bedtime Reduce Vraylar to 1.5 mg daily or 3 mg every other day.   04/01/2020 appointment with  the following noted: Amantadine hs caused NM. Low grade depression for a couple of weeks.  Not severe.   Extended work date 06/04/20 to retire date.  She feels OK about it in some ways but doesn't feel fully up to it.  Doesn't remember when hypomania resolved from last visit.   Sleep is much better now uninterrupted. Hard to remember lithium at lunch. Still has tremor but better with amantadine.  Anxiety still through the roof.  Past Psychiatric Medication Trials: Vrayla 4.5 SE mouth movements reduced to 3 mg 3/20 lithium 150,  Latuda 80, Trileptal 450, olanzapine, Seroquel, risperidone, Abilify Focalin,  Ritalin,  Depakote, Equetro 300 balance issues, Lamictal 200 twice daily, fluoxetine 60,  buspirone, sertraline 100, Wellbutrin history of facial tics, paroxetine cognitive side effects ropinirole, amantadine, Sinemet, Artane, Cogentin, trazodone hangover, Ambien hangover,  Review of Systems:  Review of Systems  HENT: Positive for tinnitus.        Chirping cricket sounds in hears since January  Gastrointestinal: Negative for nausea.  Musculoskeletal: Positive for arthralgias. Negative for gait problem.  Neurological: Positive for tremors. Negative for dizziness, seizures, syncope and weakness.       Still balance problems. No falls lately.  Psychiatric/Behavioral: Positive for depression and dysphoric mood. Negative for agitation, behavioral problems, confusion, decreased concentration, hallucinations, self-injury, sleep disturbance and suicidal ideas. The patient is nervous/anxious. The patient is not hyperactive.   No falls since here. Not currently depressed but unable to remove this from the list.  Medications: I have reviewed the patient's current medications.  Current Outpatient Medications  Medication Sig Dispense Refill  . amantadine (SYMMETREL) 100 MG capsule Take 1 capsule (100 mg total) by mouth 2 (two) times daily. (Patient taking differently: Take 100 mg by mouth  daily. ) 60 capsule 1  . atorvastatin (LIPITOR) 10 MG tablet Take 10 mg by mouth daily.    . carbamazepine (EQUETRO) 200 MG CP12 12 hr capsule Take 200 mg by mouth at bedtime.    . carbamazepine (TEGRETOL) 100 MG chewable tablet Chew 1 tablet (100 mg total) by mouth at bedtime. 90 tablet 0  . cetirizine (ZYRTEC) 10 MG tablet Take 10 mg by mouth daily.    . cholecalciferol (VITAMIN D) 25 MCG (1000 UNIT) tablet Take 1,000 Units by mouth daily.    . ferrous gluconate (FERGON) 324 MG tablet Take 324 mg by mouth daily with breakfast.    . lamoTRIgine (LAMICTAL) 200 MG tablet Take 1 tablet (200 mg total) by mouth 2 (two) times daily. 180 tablet 0  . lithium carbonate 150 MG capsule Take 1 capsule (150 mg total) by mouth daily in the afternoon. 90 capsule 1  . pantoprazole (PROTONIX) 40 MG tablet Take 1 tablet (40 mg total) by mouth daily. 90 tablet 0  . VRAYLAR capsule TAKE (1) CAPSULE BY MOUTH ONCE DAILY. (Patient taking differently: Take 1.5 mg by mouth daily. ) 30 capsule 0  . dexmethylphenidate (FOCALIN) 10 MG tablet 1 tablet at 1 PM 30 tablet 0  . Dexmethylphenidate HCl (FOCALIN XR) 25 MG CP24 Take 1 capsule by mouth every morning. 30 capsule 0  . loxapine (LOXITANE) 10 MG capsule Take 4 capsules (40 mg total) by mouth at bedtime. 120 capsule 1   No current facility-administered medications for this visit.    Medication Side Effects: Other: tremor and weight gain.  Dyskinesia appears better  SE bettter than they were.  Balance problems intermittently  Allergies:  Allergies  Allergen Reactions  . Azithromycin Anaphylaxis  . Penicillins Anaphylaxis    DID THE REACTION INVOLVE: Swelling of the face/tongue/throat, SOB, or low BP? Yes Sudden or severe rash/hives, skin peeling, or the inside of the mouth or nose? Yes Did it require medical treatment? No When did it last happen? If all above answers are "NO", may proceed with cephalosporin use.  . Adhesive [Tape] Other (See Comments)     On bandaids    Past Medical History:  Diagnosis Date  . ADD (attention deficit disorder)   . Allergy    Seasonal  . Anemia    History of GI blood loss  . Anxiety   . Arthritis   . Bipolar 1 disorder ([place])   . Cancer ([place])   . Colon polyps   . Depression   . Diabetes mellitus ([place])   . Edema, lower extremity   . Epistaxis  Around 2011 or 2012, required cauterization.   . Esophageal stricture   . GERD (gastroesophageal reflux disease)   . Headache(784.0)   . Hyperlipidemia   . Interstitial cystitis   . Joint pain   . Lactose intolerance   . Lung cancer ([place]) 2002  . Obesity   . Osteoarthritis   . Palpitations   . Sleep apnea    Doesn't use a CPAP  . Swallowing difficulty     Family History  Problem Relation Age of Onset  . Arthritis Mother   . Hearing loss Mother   . Hyperlipidemia Mother   . Hypertension Mother   . Depression Mother   . Anxiety disorder Mother   . Obesity Mother   . Sudden death Mother   . Hypertension Father   . Diabetes Mellitus II Father   . Heart disease Father   . Arthritis Father   . Cancer Father        Brain  . COPD Father   . Diabetes Father   . Hyperlipidemia Father   . Sleep apnea Father   . Early death Sister        Aneroxia/Bulimic  . Depression Brother   . Early death Investment banker, corporate  . Depression Daughter   . Drug abuse Daughter   . Heart disease Daughter   . Hypertension Daughter   . Stroke Maternal Grandmother   . Hypertension Maternal Grandmother   . Arthritis Maternal Grandfather   . Heart attack Maternal Grandfather   . Hearing loss Maternal Grandfather   . Colon cancer Neg Hx   . Esophageal cancer Neg Hx   . Rectal cancer Neg Hx     Social History   Socioeconomic History  . Marital status: Married    Spouse name: Not on file  . Number of children: 1  . Years of education: Not on file  . Highest education level: Not on file  Occupational History  . Occupation: Admin. assistant   Tobacco Use  . Smoking status: Never Smoker  . Smokeless tobacco: Never Used  Vaping Use  . Vaping Use: Never used  Substance and Sexual Activity  . Alcohol use: Yes    Alcohol/week: 1.0 standard drink    Types: 1 Glasses of wine per week    Comment: Moderate  . Drug use: No  . Sexual activity: Yes  Other Topics Concern  . Not on file  Social History Narrative   Pt lives in [place] with husband Lanny Hurst.  Followed by Dr. Clovis Pu for psychiatry and Rinaldo Cloud for therapy.   Social Determinants of Health   Financial Resource Strain:   . Difficulty of Paying Living Expenses:   Food Insecurity:   . Worried About Charity fundraiser in the Last Year:   . Arboriculturist in the Last Year:   Transportation Needs:   . Film/video editor (Medical):   Marland Kitchen Lack of Transportation (Non-Medical):   Physical Activity:   . Days of Exercise per Week:   . Minutes of Exercise per Session:   Stress:   . Feeling of Stress :   Social Connections:   . Frequency of Communication with Friends and Family:   . Frequency of Social Gatherings with Friends and Family:   . Attends Religious Services:   . Active Member of Clubs or Organizations:   . Attends Archivist Meetings:   Marland Kitchen Marital Status:   Intimate Partner Violence:   .  Fear of Current or Ex-Partner:   . Emotionally Abused:   Marland Kitchen Physically Abused:   . Sexually Abused:     Past Medical History, Surgical history, Social history, and Family history were reviewed and updated as appropriate.   Please see review of systems for further details on the patient's review from today.   Objective:   Physical Exam:  There were no vitals taken for this visit.  Physical Exam Constitutional:      General: She is not in acute distress.    Appearance: She is well-developed.  Musculoskeletal:        General: No deformity.  Neurological:     Mental Status: She is alert and oriented to person, place, and time.     Cranial Nerves: No  dysarthria.     Motor: Tremor present.     Coordination: Coordination normal.  Psychiatric:        Attention and Perception: Attention and perception normal. She does not perceive auditory or visual hallucinations.        Mood and Affect: Mood is anxious and depressed. Affect is not labile, blunt, angry, tearful or inappropriate.        Speech: Speech normal. Speech is not rapid and pressured or slurred.        Behavior: Behavior is not agitated. Behavior is cooperative.        Thought Content: Thought content normal. Thought content is not paranoid or delusional. Thought content does not include homicidal or suicidal ideation. Thought content does not include homicidal or suicidal plan.        Cognition and Memory: Cognition normal. She exhibits impaired recent memory.        Judgment: Judgment normal.     Comments: Insight fair.      Lab Review:     Component Value Date/Time   NA 139 03/02/2020 1433   K 4.5 03/02/2020 1433   CL 103 03/02/2020 1433   CO2 22 03/02/2020 1433   GLUCOSE 83 03/02/2020 1433   GLUCOSE 102 (H) 01/19/2020 1158   BUN 15 03/02/2020 1433   CREATININE 0.76 03/02/2020 1433   CALCIUM 9.4 03/02/2020 1433   PROT 6.4 03/02/2020 1433   ALBUMIN 4.3 03/02/2020 1433   AST 18 03/02/2020 1433   ALT 8 03/02/2020 1433   ALKPHOS 88 03/02/2020 1433   BILITOT 0.2 03/02/2020 1433   GFRNONAA 83 03/02/2020 1433   GFRAA 96 03/02/2020 1433       Component Value Date/Time   WBC 6.0 03/02/2020 1433   WBC 7.4 01/19/2020 1158   RBC 4.39 03/02/2020 1433   RBC 4.19 01/19/2020 1158   HGB 13.7 03/02/2020 1433   HCT 40.4 03/02/2020 1433   PLT 219 03/02/2020 1433   MCV 92 03/02/2020 1433   MCH 31.2 03/02/2020 1433   MCH 30.3 01/19/2020 1158   MCHC 33.9 03/02/2020 1433   MCHC 32.1 01/19/2020 1158   RDW 12.6 03/02/2020 1433   LYMPHSABS 1.6 03/02/2020 1433   MONOABS 0.7 04/04/2019 1004   EOSABS 0.2 03/02/2020 1433   BASOSABS 0.0 03/02/2020 1433  Vitamin D level 33 on 10K  units daily on 12/4/`9 Increased to prescription vitamin d 50K units Monday, Wed, Friday.  Rx sent in.   Lithium Lvl  Date Value Ref Range Status  10/21/2018 0.18 (L) 0.60 - 1.20 mmol/L Final    Comment:    Performed at [facility], [address] [place], [place] [address]     No results found for: PHENYTOIN,  PHENOBARB, VALPROATE, CBMZ   .res Assessment: Plan:    Bipolar I disorder with rapid cycling ([place]) - Plan: loxapine (LOXITANE) 10 MG capsule  Generalized anxiety disorder  Attention deficit hyperactivity disorder (ADHD), predominantly inattentive type - Plan: dexmethylphenidate (FOCALIN) 10 MG tablet, Dexmethylphenidate HCl (FOCALIN XR) 25 MG CP24  Mild cognitive impairment  Tremor due to multiple drugs  Low vitamin D level  Greater than 50% of 30 min face to face time with patient was spent on counseling and coordination of care. We discussed the following: Salisa has chronic rapid cycling bipolar disorder which is chronically unstable and has been difficult to control.  The rapid cycling is making it difficult to control frequency of depressive episodes and the anxiety as well.  We have typically had to make frequent med changes.  We have had to reduce mood stabilizer dosages including Vraylar and Equetro she is very sensitive to carbamazepine because higher doses cause dizziness.   Overall rapid cycling pattern continues.   Still unstable and manic.  Increase loxapine 30 mg HS with goal of weaning Vraylar bc pt doesn't tolerate enough Vraylar to manage mood. Reduce Vraylar to 1.5 mg daily with goal of weaning it if possible.  Since last year when she was stable she started having hypomanic symptoms again.  We increased Equetro back to 300 mg nightly but she she has had balance problems.  It had stopped the manic symptoms but then she started having balance problems again and we had to switch it back to Equetro 300 and add carbamazepine immediate release 100 nightly.. Less  neurological problems with reduction in Vraylar.   It could have been her prior neurologic symptoms were related to the combination of medications.  But I am hesitant to increase the Vraylar unless absolutely necessary.  ADD managed with Focalin.  Discussed the risk of stimulants including that that could contribute to mood instability and mood swings.  She appears to need it in order to function effectively at work.  She has a high residual anxiety.  It has been impossible to control all of her symptoms simultaneously without causing side effects.  She agrees.  Continue the following Discussed side effects of each medicine. Focalin XR 25 mg every morning and Focalin 10 mg immediate release daily Equetro 200 mg nightly Carbamazepine immediate release 100 mg nightly Increase loxapine 40 mg HS. Lamotrigine 200 mg twice daily Lithium 150 mg nightly Continue Vraylar 1.5 mg daily  Discussed potential metabolic side effects associated with atypical antipsychotics, as well as potential risk for movement side effects. Advised pt to contact office if movement side effects occur.  She may be having some mild TD with toe movement.  Option treat tremor.  Conintue amanatadine 100 mg AM prn tremor.  Disc SE meds and this is heightened by the complication of necessary polypharmacy.  FU results of PT for balance.    Supportive therapy in terms of dealing with husband's addiction.  She's interested in Bipolar Support Group and info given.  Requires frequent FU DT chronic instability.  FU 4 weeks.  Lynder Parents MD, DFAPA  Please see After Visit Summary for patient specific instructions. Increase loxapine to 4 capsules 1 to 2 hours before bedtime   Future Appointments  Date Time Provider [place]  04/06/2020  7:20 AM [name], DO [facility] None  04/07/2020 10:00 AM Shanon Ace, LCSW [facility] None  04/16/2020  8:00 AM Shanon Ace, LCSW [facility] None  04/22/2020 10:00 AM Shanon Ace,  LCSW CP-CP  None  04/30/2020  7:30 AM [facility] DX DEXA 1 [facility]  05/04/2020  9:45 AM Cottle, Billey Co., MD CP-CP None  05/06/2020 11:00 AM Shanon Ace, LCSW [facility] None  05/13/2020  8:00 AM Shanon Ace, LCSW CP-CP None  05/29/2020 10:15 AM Posey Pronto Thomasene Lot, DPM [facility]    No orders of the defined types were placed in this encounter.     -------------------------------

## 2020-04-06 ENCOUNTER — Encounter (INDEPENDENT_AMBULATORY_CARE_PROVIDER_SITE_OTHER): Payer: Self-pay | Admitting: Family Medicine

## 2020-04-06 ENCOUNTER — Ambulatory Visit (INDEPENDENT_AMBULATORY_CARE_PROVIDER_SITE_OTHER): Payer: BC Managed Care – PPO | Admitting: Family Medicine

## 2020-04-06 ENCOUNTER — Other Ambulatory Visit: Payer: Self-pay

## 2020-04-06 VITALS — BP 109/73 | HR 95 | Temp 98.4°F | Ht 60.0 in | Wt 173.0 lb

## 2020-04-06 DIAGNOSIS — R7301 Impaired fasting glucose: Secondary | ICD-10-CM | POA: Diagnosis not present

## 2020-04-06 DIAGNOSIS — E7849 Other hyperlipidemia: Secondary | ICD-10-CM

## 2020-04-06 DIAGNOSIS — F319 Bipolar disorder, unspecified: Secondary | ICD-10-CM

## 2020-04-06 DIAGNOSIS — Z9189 Other specified personal risk factors, not elsewhere classified: Secondary | ICD-10-CM

## 2020-04-06 DIAGNOSIS — E669 Obesity, unspecified: Secondary | ICD-10-CM

## 2020-04-06 DIAGNOSIS — Z6833 Body mass index (BMI) 33.0-33.9, adult: Secondary | ICD-10-CM

## 2020-04-06 MED ORDER — ATORVASTATIN CALCIUM 20 MG PO TABS: 20.0000 mg | ORAL_TABLET | Freq: Every day | ORAL | 0 refills | Status: DC

## 2020-04-06 NOTE — Progress Notes (Signed)
Chief Complaint:   OBESITY Megan Whitney is here to discuss her progress with her obesity treatment plan along with follow-up of her obesity related diagnoses. Megan Whitney is on the Category 1 Plan and states she is following her eating plan approximately 85% of the time. Megan Whitney states she is exercising for 0 minutes 0 times per week.  Today's visit was #: 3 Starting weight: 188 lbs Starting date: 03/02/2020 Today's weight: 173 lbs Today's date: 04/06/2020 Total lbs lost to date: 15 lbs Total lbs lost since last in-office visit: 6 lbs  Interim History: Megan Whitney says she has done well over the past 2 weeks.  Yesterday, she had a big cookout with family.  She had lean hamburger with a bun, vegetables, and 1-2 bites of cake and a tiny bowl of ice cream.  She says that emotionally, she has some ups and downs.  Physically, she has more energy and is less sluggish and less achy.  She saw Dr. Clovis Pu recently and her psychiatric medications were changed around.  Subjective:   1. Elevated fasting blood sugar Blood sugar has been well-controlled.  Last labs were within normal limits. . Lab Results  Component Value Date   NA 139 03/02/2020   K 4.5 03/02/2020   CO2 22 03/02/2020   GLUCOSE 83 03/02/2020   BUN 15 03/02/2020   CREATININE 0.76 03/02/2020   CALCIUM 9.4 03/02/2020   GFRNONAA 83 03/02/2020   GFRAA 96 03/02/2020   2. Other hyperlipidemia Marijose has hyperlipidemia and has been trying to improve her cholesterol levels with intensive lifestyle modification including a low saturated fat diet, exercise and weight loss. She denies any chest pain, claudication or myalgias.  She is taking Lipitor 10 mg daily.  LDL is 135.  Lab Results  Component Value Date   ALT 8 03/02/2020   AST 18 03/02/2020   ALKPHOS 88 03/02/2020   BILITOT 0.2 03/02/2020   Lab Results  Component Value Date   CHOL 238 (H) 03/02/2020   HDL 90 03/02/2020   LDLCALC 135 (H) 03/02/2020   TRIG 77 03/02/2020   CHOLHDL 2.6  03/02/2020   3. Bipolar disorder, rapid cycling Megan Whitney [facility]) Rashika has seen her psychiatrist recently, and her medications were changed.  She sees her therapist every 2 weeks, who gives her skills to manage stress and life coaching.  4. At risk for heart disease Megan Whitney is at higher than average risk for developing diabetes due to her obesity.   Assessment/Plan:   1. Elevated fasting blood sugar Continue prudent nutritional plan, weight loss.  2. Other hyperlipidemia Cardiovascular risk and specific lipid/LDL goals reviewed.  We discussed several lifestyle modifications today and Megan Whitney will continue to work on diet, exercise and weight loss efforts. Orders and follow up as documented in patient record.  Change Lipitor dose to 20 mg at bedtime. Recheck ALT, FLP in 6-8 weeks.  Continue prudent nutritional plan, weight loss.   Counseling Intensive lifestyle modifications are the first line treatment for this issue. . Dietary changes: Increase soluble fiber. Decrease simple carbohydrates. . Exercise changes: Moderate to vigorous-intensity aerobic activity 150 minutes per week if tolerated. . Lipid-lowering medications: see documented in medical record. - atorvastatin (LIPITOR) 20 MG tablet; Take 1 tablet (20 mg total) by mouth at bedtime.  Dispense: 30 tablet; Refill: 0  3. Bipolar disorder, rapid cycling [facility]) Treatment plan per Psychiatry.  Will add exercise/wallking to regimen.  4. At risk for heart disease Megan Whitney was given approximately 15 minutes of diabetes education  and counseling today. We discussed intensive lifestyle modifications today with an emphasis on weight loss as well as increasing exercise and decreasing simple carbohydrates in her diet. We also reviewed medication options with an emphasis on risk versus benefit of those discussed.   Repetitive spaced learning was employed today to elicit superior memory formation and behavioral change.  5. Class 1 obesity with serious comorbidity  and body mass index (BMI) of 33.0 to 33.9 in adult, unspecified obesity type Megan Whitney is currently in the action stage of change. As such, her goal is to continue with weight loss efforts. She has agreed to the Category 1 Plan.   Exercise goals: 20 minutes of walking every other day.  Behavioral modification strategies: increasing lean protein intake, increasing water intake, meal planning and cooking strategies and planning for success.  Aprel has agreed to follow-up with our clinic in 2 weeks. She was informed of the importance of frequent follow-up visits to maximize her success with intensive lifestyle modifications for her multiple health conditions.   Objective:   Blood pressure 109/73, pulse 95, temperature 98.4 F (36.9 C), height 5' (1.524 m), weight 173 lb (78.5 kg), SpO2 96 %. Body mass index is 33.79 kg/m.  General: Cooperative, alert, well developed, in no acute distress. HEENT: Conjunctivae and lids unremarkable. Cardiovascular: Regular rhythm.  Lungs: Normal work of breathing. Neurologic: No focal deficits.   Lab Results  Component Value Date   CREATININE 0.76 03/02/2020   BUN 15 03/02/2020   NA 139 03/02/2020   K 4.5 03/02/2020   CL 103 03/02/2020   CO2 22 03/02/2020   Lab Results  Component Value Date   ALT 8 03/02/2020   AST 18 03/02/2020   ALKPHOS 88 03/02/2020   BILITOT 0.2 03/02/2020   Lab Results  Component Value Date   HGBA1C 5.2 03/02/2020   HGBA1C 5.6 04/04/2019   Lab Results  Component Value Date   INSULIN 4.7 03/02/2020   Lab Results  Component Value Date   TSH 2.830 03/02/2020   Lab Results  Component Value Date   CHOL 238 (H) 03/02/2020   HDL 90 03/02/2020   LDLCALC 135 (H) 03/02/2020   TRIG 77 03/02/2020   CHOLHDL 2.6 03/02/2020   Lab Results  Component Value Date   WBC 6.0 03/02/2020   HGB 13.7 03/02/2020   HCT 40.4 03/02/2020   MCV 92 03/02/2020   PLT 219 03/02/2020   Lab Results  Component Value Date   IRON 114  03/02/2020   TIBC 318 03/02/2020   FERRITIN 57 03/02/2020   Attestation Statements:   Reviewed by clinician on day of visit: allergies, medications, problem list, medical history, surgical history, family history, social history, and previous encounter notes.  I, Water quality scientist, CMA, am acting as Location manager for [employer], DO.  I have reviewed the above documentation for accuracy and completeness, and I agree with the above. [name], DO

## 2020-04-07 ENCOUNTER — Other Ambulatory Visit: Payer: Self-pay | Admitting: Emergency Medicine

## 2020-04-07 ENCOUNTER — Ambulatory Visit (INDEPENDENT_AMBULATORY_CARE_PROVIDER_SITE_OTHER): Payer: BC Managed Care – PPO | Admitting: Psychiatry

## 2020-04-07 ENCOUNTER — Telehealth: Payer: Self-pay | Admitting: Physician Assistant

## 2020-04-07 DIAGNOSIS — F319 Bipolar disorder, unspecified: Secondary | ICD-10-CM

## 2020-04-07 DIAGNOSIS — K219 Gastro-esophageal reflux disease without esophagitis: Secondary | ICD-10-CM

## 2020-04-07 MED ORDER — PANTOPRAZOLE SODIUM 40 MG PO TBEC: 40.0000 mg | DELAYED_RELEASE_TABLET | Freq: Every day | ORAL | 2 refills | Status: DC

## 2020-04-07 MED ORDER — CETIRIZINE HCL 10 MG PO TABS: 10.0000 mg | ORAL_TABLET | Freq: Every day | ORAL | 3 refills | Status: DC
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
## 2020-04-07 NOTE — Telephone Encounter (Signed)
Rx sent to the preferred patient pharmacy  

## 2020-04-07 NOTE — Telephone Encounter (Signed)
Patient needs new prescriptions sent to [facility], [place] see any appointments with E[facility] now or in the future.  Please advise

## 2020-04-13 ENCOUNTER — Ambulatory Visit: Payer: BC Managed Care – PPO | Admitting: Psychiatry

## 2020-04-13 ENCOUNTER — Other Ambulatory Visit: Payer: Self-pay | Admitting: Psychiatry

## 2020-04-13 DIAGNOSIS — F319 Bipolar disorder, unspecified: Secondary | ICD-10-CM

## 2020-04-14 ENCOUNTER — Ambulatory Visit: Payer: BC Managed Care – PPO | Admitting: Psychiatry

## 2020-04-15 ENCOUNTER — Other Ambulatory Visit: Payer: BC Managed Care – PPO

## 2020-04-16 ENCOUNTER — Ambulatory Visit: Payer: BC Managed Care – PPO | Admitting: Psychiatry

## 2020-04-22 ENCOUNTER — Other Ambulatory Visit: Payer: Self-pay

## 2020-04-22 ENCOUNTER — Other Ambulatory Visit: Payer: Self-pay | Admitting: Psychiatry

## 2020-04-22 ENCOUNTER — Ambulatory Visit (INDEPENDENT_AMBULATORY_CARE_PROVIDER_SITE_OTHER): Payer: BC Managed Care – PPO | Admitting: Psychiatry

## 2020-04-22 DIAGNOSIS — F319 Bipolar disorder, unspecified: Secondary | ICD-10-CM | POA: Diagnosis not present

## 2020-04-22 DIAGNOSIS — G251 Drug-induced tremor: Secondary | ICD-10-CM

## 2020-04-22 NOTE — Progress Notes (Signed)
      [facility] Counselor/Therapist Progress Note  Patient ID: Megan Whitney, MRN: [id number],    Date: 04/22/2020  Time Spent: 60 minutes   10:00am to 11:00am  Treatment Type: Individual Therapy  Reported Symptoms: anxiety "increased", depression, "insecurity and not being sure of myself"  Mental Status Exam:  Appearance:   Neat     Behavior:  Appropriate, Sharing and Motivated  Motor:  Normal exept tremor in right hand  Speech/Language:   Clear and Coherent  Affect:  anxiety, depressioon  Mood:  anxious and depressed  Thought process:  normal  Thought content:    WNL  Sensory/Perceptual disturbances:    WNL  Orientation:  oriented to person, place, time/date, situation, day of week, month of year and year  Attention:  Fair  Concentration:  Fair  Memory:  forgetfulness and doctor is aware  Fund of knowledge:   Good  Insight:    Fair  Judgment:   Fair  Impulse Control:  Good   Risk Assessment: Danger to Self:  No Self-injurious Behavior: No Danger to Others: No Duty to Warn:no Physical Aggression / Violence:No  Access to Firearms a concern: No  Gang Involvement:No   Subjective: Patient reports increased anxiety and some depression "and 2 days I had a manic episode when I couldn't find my dog, but got better after we found dog safely"and "after that I was ok."   Interventions: Cognitive Behavioral Therapy and Solution-Oriented/Positive Psychology  Diagnosis:   ICD-10-CM   1. Bipolar I disorder with rapid cycling ([place])  F31.9     Plan: Patient not signing tx plan on computer screen due to [place].  Treatment Goals: Goals remain on plan as patient works on strategies to meet her goals. Progress is noted each visit in "Progress" section of goal plan.  Long Term Goal: Reduce overall level, frequency, and intensity of the anxiety so that daily functioning is not impaired.  Short Term Goal: 1.Increase understanding of the beliefs and messages that produce  the worry and anxiety.  Strategies: 1.Help client develop reality-based positive cognitive messages/self-talk. 2. Develop a "coping card" or other reminder which coping strategies are recorded for patient's later use .  PROGRESS: Patient in today reporting increased anxiety, depression, and feeling insecure. Retirement still on schedule for latter part of September.  Discussed being "very worried about my husband's health due to his long hx of drinking excessively."  Husband has appt today with his Dr and patient states he doesn't ever share his medical info with her.  Is more anxious about husband's health and her  pending retirement in September. Worked really well today and to continue between sessions working on her short-term goal and strategies in goal plan above, as she focused on staying in the present, recognizing triggers to her anxiety more quickly, and refrain from saying "I can't" to every challenge that arises.  Motivation is better today. On 1-10 scale of Anxiety she rates herself a "7" today and on 1-10 scale for Depression she rates herself a "5: today.  Goal review and progress/challenges noted with patient.  Next appt within 2 weeks.   Shanon Ace, LCSW

## 2020-04-23 NOTE — Telephone Encounter (Signed)
Change to daily?

## 2020-04-27 ENCOUNTER — Encounter (INDEPENDENT_AMBULATORY_CARE_PROVIDER_SITE_OTHER): Payer: Self-pay | Admitting: Family Medicine

## 2020-04-27 ENCOUNTER — Ambulatory Visit (INDEPENDENT_AMBULATORY_CARE_PROVIDER_SITE_OTHER): Payer: BC Managed Care – PPO | Admitting: Family Medicine

## 2020-04-27 ENCOUNTER — Other Ambulatory Visit: Payer: Self-pay

## 2020-04-27 VITALS — BP 130/84 | HR 102 | Temp 98.1°F | Ht 60.0 in | Wt 168.0 lb

## 2020-04-27 DIAGNOSIS — E669 Obesity, unspecified: Secondary | ICD-10-CM

## 2020-04-27 DIAGNOSIS — Z6833 Body mass index (BMI) 33.0-33.9, adult: Secondary | ICD-10-CM

## 2020-04-27 DIAGNOSIS — E7849 Other hyperlipidemia: Secondary | ICD-10-CM | POA: Diagnosis not present

## 2020-04-27 DIAGNOSIS — Z9189 Other specified personal risk factors, not elsewhere classified: Secondary | ICD-10-CM | POA: Diagnosis not present

## 2020-04-27 DIAGNOSIS — R7301 Impaired fasting glucose: Secondary | ICD-10-CM

## 2020-04-27 MED ORDER — ATORVASTATIN CALCIUM 20 MG PO TABS: 20.0000 mg | ORAL_TABLET | Freq: Every day | ORAL | 0 refills | Status: DC

## 2020-04-27 NOTE — Progress Notes (Signed)
Chief Complaint:   OBESITY Megan Whitney is here to discuss her progress with her obesity treatment plan along with follow-up of her obesity related diagnoses. Megan Whitney is on the Category 1 Plan and states she is following her eating plan approximately 75% of the time. Megan Whitney states she is exercising for 0 minutes 0 times per week.  Today's visit was #: 4 Starting weight: 188 lbs Starting date: 03/02/2020 Today's weight: 168 lbs Today's date: 04/27/2020 Total lbs lost to date: 20 lbs Total lbs lost since last in-office visit: 5 lbs  Interim History: Megan Whitney says the plan is getting boring for her.  She says her hunger and cravings are well-controlled.  Her husband is a Biomedical scientist and he only cooks "[employer]".  She says that her mood is stable.  No issues.  Subjective:   1. Other hyperlipidemia Megan Whitney has hyperlipidemia and has been trying to improve her cholesterol levels with intensive lifestyle modification including a low saturated fat diet, exercise and weight loss. She denies any chest pain, claudication or myalgias.  Megan Whitney says she has had no issues with going from 10 mg Lipitor to 20 mg on 04/06/2020.  Lab Results  Component Value Date   ALT 8 03/02/2020   AST 18 03/02/2020   ALKPHOS 88 03/02/2020   BILITOT 0.2 03/02/2020   Lab Results  Component Value Date   CHOL 238 (H) 03/02/2020   HDL 90 03/02/2020   LDLCALC 135 (H) 03/02/2020   TRIG 77 03/02/2020   CHOLHDL 2.6 03/02/2020   2. Elevated fasting blood sugar Megan Whitney has had hyperglycemia.   3. At risk for heart disease Megan Whitney is at a higher than average risk for cardiovascular disease due to obesity.   Assessment/Plan:   1. Other hyperlipidemia Cardiovascular risk and specific lipid/LDL goals reviewed.  We discussed several lifestyle modifications today and Megan Whitney will continue to work on diet, exercise and weight loss efforts. Orders and follow up as documented in patient record.  Will refill Lipitor 20 mg at bedtime.   Recheck cholesterol panel and ALT in early October after she has been on a higher dose for 8 weeks.  Counseling Intensive lifestyle modifications are the first line treatment for this issue.  Dietary changes: Increase soluble fiber. Decrease simple carbohydrates.  Exercise changes: Moderate to vigorous-intensity aerobic activity 150 minutes per week if tolerated.  Lipid-lowering medications: see documented in medical record.  - Refill atorvastatin (LIPITOR) 20 MG tablet; Take 1 tablet (20 mg total) by mouth at bedtime.  Dispense: 30 tablet; Refill: 0  2. Elevated fasting blood sugar Will continue to monitor A1c and fasting blood sugar.  3. At risk for heart disease Megan Whitney was given approximately 16 minutes of coronary artery disease prevention counseling today to include the following.  She is 64 y.o. female and has risk factors for heart disease including obesity. We discussed intensive lifestyle modifications today with an emphasis on specific weight loss instructions and strategies.   4. Class 1 obesity with serious comorbidity and body mass index (BMI) of 33.0 to 33.9 in adult, unspecified obesity type Megan Whitney is currently in the action stage of change. As such, her goal is to continue with weight loss efforts. She has agreed to the Category 1 Plan.   Increase water from 16.9-32 ounces up to 60 ounces per day.  Exercise goals: As is.  Behavioral modification strategies: increasing lean protein intake, increasing water intake, meal planning and cooking strategies, ways to avoid boredom eating and planning for  success.  Megan Whitney has agreed to follow-up with our clinic in 3 weeks. She was informed of the importance of frequent follow-up visits to maximize her success with intensive lifestyle modifications for her multiple health conditions.   Objective:   Blood pressure 130/84, pulse (!) 102, temperature 98.1 F (36.7 C), height 5' (1.524 m), weight 168 lb (76.2 kg), SpO2 95 %. Body mass  index is 32.81 kg/m.  General: Cooperative, alert, well developed, in no acute distress. HEENT: Conjunctivae and lids unremarkable. Cardiovascular: Regular rhythm.  Lungs: Normal work of breathing. Neurologic: No focal deficits.   Lab Results  Component Value Date   CREATININE 0.76 03/02/2020   BUN 15 03/02/2020   NA 139 03/02/2020   K 4.5 03/02/2020   CL 103 03/02/2020   CO2 22 03/02/2020   Lab Results  Component Value Date   ALT 8 03/02/2020   AST 18 03/02/2020   ALKPHOS 88 03/02/2020   BILITOT 0.2 03/02/2020   Lab Results  Component Value Date   HGBA1C 5.2 03/02/2020   HGBA1C 5.6 04/04/2019   Lab Results  Component Value Date   INSULIN 4.7 03/02/2020   Lab Results  Component Value Date   TSH 2.830 03/02/2020   Lab Results  Component Value Date   CHOL 238 (H) 03/02/2020   HDL 90 03/02/2020   LDLCALC 135 (H) 03/02/2020   TRIG 77 03/02/2020   CHOLHDL 2.6 03/02/2020   Lab Results  Component Value Date   WBC 6.0 03/02/2020   HGB 13.7 03/02/2020   HCT 40.4 03/02/2020   MCV 92 03/02/2020   PLT 219 03/02/2020   Lab Results  Component Value Date   IRON 114 03/02/2020   TIBC 318 03/02/2020   FERRITIN 57 03/02/2020   Attestation Statements:   Reviewed by clinician on day of visit: allergies, medications, problem list, medical history, surgical history, family history, social history, and previous encounter notes.  I, Water quality scientist, CMA, am acting as Location manager for [employer], DO.  I have reviewed the above documentation for accuracy and completeness, and I agree with the above. [name], DO

## 2020-04-30 ENCOUNTER — Other Ambulatory Visit: Payer: Self-pay

## 2020-04-30 ENCOUNTER — Ambulatory Visit
Admission: RE | Admit: 2020-04-30 | Discharge: 2020-04-30 | Disposition: A | Discharge status: Home / Self Care | Payer: BC Managed Care – PPO | Source: Ambulatory Visit | Attending: Nurse Practitioner | Admitting: Nurse Practitioner

## 2020-04-30 DIAGNOSIS — M8589 Other specified disorders of bone density and structure, multiple sites: Secondary | ICD-10-CM | POA: Diagnosis not present

## 2020-04-30 DIAGNOSIS — Z13820 Encounter for screening for osteoporosis: Secondary | ICD-10-CM

## 2020-04-30 DIAGNOSIS — Z780 Asymptomatic menopausal state: Secondary | ICD-10-CM | POA: Diagnosis not present

## 2020-05-04 ENCOUNTER — Other Ambulatory Visit: Payer: Self-pay

## 2020-05-04 ENCOUNTER — Ambulatory Visit (INDEPENDENT_AMBULATORY_CARE_PROVIDER_SITE_OTHER): Payer: BC Managed Care – PPO | Admitting: Psychiatry

## 2020-05-04 ENCOUNTER — Encounter: Payer: Self-pay | Admitting: Psychiatry

## 2020-05-04 DIAGNOSIS — F411 Generalized anxiety disorder: Secondary | ICD-10-CM | POA: Diagnosis not present

## 2020-05-04 DIAGNOSIS — G3184 Mild cognitive impairment, so stated: Secondary | ICD-10-CM

## 2020-05-04 DIAGNOSIS — F3162 Bipolar disorder, current episode mixed, moderate: Secondary | ICD-10-CM | POA: Diagnosis not present

## 2020-05-04 DIAGNOSIS — F900 Attention-deficit hyperactivity disorder, predominantly inattentive type: Secondary | ICD-10-CM | POA: Diagnosis not present

## 2020-05-04 DIAGNOSIS — G251 Drug-induced tremor: Secondary | ICD-10-CM

## 2020-05-04 DIAGNOSIS — R7989 Other specified abnormal findings of blood chemistry: Secondary | ICD-10-CM

## 2020-05-04 DIAGNOSIS — F319 Bipolar disorder, unspecified: Secondary | ICD-10-CM | POA: Diagnosis not present

## 2020-05-04 MED ORDER — CARBAMAZEPINE 100 MG PO CHEW: 100.0000 mg | CHEWABLE_TABLET | Freq: Every day | ORAL | 0 refills | Status: DC

## 2020-05-04 MED ORDER — LOXAPINE SUCCINATE 25 MG PO CAPS: ORAL_CAPSULE | ORAL | 1 refills | Status: DC

## 2020-05-04 MED ORDER — DEXMETHYLPHENIDATE HCL 10 MG PO TABS: ORAL_TABLET | ORAL | 0 refills | Status: DC

## 2020-05-04 MED ORDER — DEXMETHYLPHENIDATE HCL ER 25 MG PO CP24: 1.0000 | ORAL_CAPSULE | ORAL | 0 refills | Status: DC

## 2020-05-04 MED ORDER — CARIPRAZINE HCL 1.5 MG PO CAPS: 1.5000 mg | ORAL_CAPSULE | Freq: Every day | ORAL | 1 refills | Status: DC

## 2020-05-04 NOTE — Progress Notes (Signed)
Megan Whitney [id number] [birth year] 64 y.o.     Subjective:   Patient ID:  Megan Whitney is a 64 y.o. (DOB [birth year]) female.   Chief Complaint:  Chief Complaint  Patient presents with  . Follow-up    Medication Management  . Other    Bipolar  . Anxiety  . Depression    Depression        Associated symptoms include no decreased concentration and no suicidal ideas.  Past medical history includes anxiety.   Anxiety Symptoms include nervous/anxious behavior. Patient reports no confusion, decreased concentration, dizziness, nausea or suicidal ideas.    Medication Refill Associated symptoms include arthralgias. Pertinent negatives include no nausea or weakness.   Megan Whitney is  follow-up of r chronic mood swings and anxiety and frequent changes in medications.   At visit December 27, 2018.  Focalin XR was increased from 20 mg to 25 mg daily to help with focus and attention and potentially mood.  When seen February 13, 2019.  In an effort to reduce mood cycling we reduce fluoxetine to 20 mg daily.  At visit August 2020.  No meds were changed.  She continued the following: Focalin XR 25 mg every morning and Focalin 10 mg immediate release daily Equetro 200 mg nightly Fluoxetine 20 mg daily Lamotrigine 200 mg twice daily Lithium 150 mg nightly Vraylar 3 mg daily  She called back November 4 after seeing her therapist stating that she was having some hypomanic symptoms with reduced sleep and increased energy.  This potentiality had been discussed and the decision was made to increase Equetro from 200 mg nightly to 300 mg nightly.  seen August 12, 2019.  Because of balance problems she did not tolerate Equetro 300 mg nightly and it was changed to Equetro 200 mg nightly plus immediate release carbamazepine 100 mg nightly.  Her mood had not been stable enough on Equetro 200 mg nightly alone. Less balance problems with change in CBZ.  seen September 23, 2019.  The following was  changed: For bipolar mixed increase CBZ IR to 200 mg HS.  Disc fall and balance risks.For bipolar mixed increase CBZ IR to 200 mg HS.  Disc fall and balance risks.  She called back October 23, 2019 stating she had had another fall and felt it was due to the medication.  Therefore carbamazepine immediate release was reduced from 200 mg nightly to 100 mg nightly.  The Equetro is unchanged.  Last seen November 04, 2019.  The following was noted:  Better at the moment but balance is still somewhat of a problem.  Started PT to help balance.  Had a fall after tripping on a curb and hit her head on sidewalk.  Got a concussion with nausea and HA and dizziness and light sensitivity.  Not over it.  Concentration problems.  Has gotten back to work after a week.   Mood sx pretty good with some mild depression.  Nothing severe.  Trying to minimize stress and self care as much as possible.  No manic sx lately and sleeping fairly well.  No racing thoughts.   Working another year and plans to retire but H alcoholic and not sure it will be good to be there all the time. Seeing therapist q 2 weeks.  Therapy helping . Recent serum vitamin D level was determined to be low at 33.  The goal and chronically depressed patient's is in the 50s if possible.  So her vitamin D was increased  on August 08, 2018 or thereabouts.  Checked vitamin D level again and this time it was high at 120 and so it was stopped.  She's restarted per PCP at 1000 units daily.  01/06/2020 appointment the following is noted: Still on: Focalin XR 25 mg every morning and Focalin 10 mg immediate release daily Equetro 200 mg nightly Carbamazepine immediate release 100 mg nightly Fluoxetine 20 mg daily Lamotrigine 200 mg twice daily Lithium 150 mg nightly Vraylar 3 mg daily Not good manic.  Angry.  Missed 2 days bc sx.  Last week vacation which didn't go well.  Crying last week and missed a day.  "Pissed off at the whole world" but also depressed and hard  to get OOB today.  Everything makes me angry.   Blows up without control.  Then regrets it.  Sleep irregular lately. Finished PT which might have helped some but still balance problems. Plan: Cannot increase carbamazepine due to balance issues Temporarily Ativan for agitation 0.5 mg tablets  DT mania stop fluoxetine If fails trial loxapine  01/15/2020 patient called after hours with suicidal thoughts and patient was to go to the [facility]. Patient ultimately admitted to [facility] psychiatric unit.  Dr. Clovis Pu spoke with clinical pharmacist they are giving history of medication experience and recommendation for loxapine.  Patient hospital stay for 3 days and discharged on loxapine 10 mg nightly as the new medication.  02/10/2020 phone call patient complaining of insomnia.  Loxapine was increased from 10 to 20 mg nightly due to recent insomnia with mania.  02/14/2020 appointment with the following noted: Lately in tears Monday and Tuesday convinced she couldn't do her job.  Better last couple of days.  Motivation is not real good but not depressed like Monday and Tuesday. This week missing some meds bc couldn't get like Focalin.  Been taking other meds. No sig manic sx.  Sleep is better with more loxapine about 8 hours. Anxiety is chronic.  No SE loxapine so far unless a little dizzy here and there. No med changes.  02/25/2020 appointment urgently made after patient was recently hospitalized.  The following is noted: Unstable.  Today manic driving erratically.  Talking a mile a minute.  Not thinking clearly.  Angry.  Slept OK last night.  Hyperactive with poor productivity for a couple of days.  Weekend OK overall.   No falls lately. More tremor lately.  Retiring July 30.  Plan: For tremor amantadine 100 mg twice a day if needed. Increase loxapine to 3 capsules 1 to 2 hours before bedtime Reduce Vraylar to 1.5 mg daily or 3 mg every other day.   04/01/2020  appointment with the following noted: Amantadine hs caused NM. Low grade depression for a couple of weeks.  Not severe.   Extended work date 06/04/20 to retire date.  She feels OK about it in some ways but doesn't feel fully up to it.  Doesn't remember when hypomania resolved from last visit.   Sleep is much better now uninterrupted. Hard to remember lithium at lunch. Still has tremor but better with amantadine.  Anxiety still through the roof. Plan: Increase loxapine 40 mg HS.  05/04/20 appt with the following noted:  Increased loxapine to 40.  Anxiety no better.  All kinds of reasons including worry about retirement and paying for things, but worry is probably exaggerated and H say sit is. Sleep good usually.  No SE noted.  Not making her sleep more with  change. Still some manic sx including shortly after last visit and then depressed until the last week.  Irritable and angry. Some panic with SOB and fear of MI.  Past Psychiatric Medication Trials: Vrayla 4.5 SE mouth movements reduced to 3 mg 3/20 lithium 150,  Latuda 80, Trileptal 450, olanzapine, Seroquel, risperidone, Abilify Focalin,  Ritalin,  Depakote, Equetro 300 balance issues, Lamictal 200 twice daily, fluoxetine 60,  buspirone, sertraline 100, Wellbutrin history of facial tics, paroxetine cognitive side effects ropinirole, amantadine, Sinemet, Artane, Cogentin, trazodone hangover, Ambien hangover,  Review of Systems:  Review of Systems  HENT: Positive for tinnitus.        Chirping cricket sounds in hears since January  Gastrointestinal: Negative for nausea.  Musculoskeletal: Positive for arthralgias. Negative for gait problem.  Neurological: Positive for tremors. Negative for dizziness, seizures, syncope, weakness and light-headedness.       Still balance problems. No falls lately.  Psychiatric/Behavioral: Positive for depression and dysphoric mood. Negative for agitation, behavioral problems, confusion, decreased  concentration, hallucinations, self-injury, sleep disturbance and suicidal ideas. The patient is nervous/anxious. The patient is not hyperactive.   No falls since here. Not currently depressed but unable to remove this from the list.  Medications: I have reviewed the patient's current medications.  Current Outpatient Medications  Medication Sig Dispense Refill  . amantadine (SYMMETREL) 100 MG capsule Take 1 capsule (100 mg total) by mouth daily. 90 capsule 0  . atorvastatin (LIPITOR) 20 MG tablet Take 1 tablet (20 mg total) by mouth at bedtime. 30 tablet 0  . carbamazepine (EQUETRO) 200 MG CP12 12 hr capsule Take 200 mg by mouth at bedtime.    . carbamazepine (TEGRETOL) 100 MG chewable tablet Chew 1 tablet (100 mg total) by mouth at bedtime. 90 tablet 0  . cariprazine (VRAYLAR) capsule Take 1 capsule (1.5 mg total) by mouth daily. 30 capsule 1  . cetirizine (ZYRTEC) 10 MG tablet Take 1 tablet (10 mg total) by mouth daily. 90 tablet 3  . cholecalciferol (VITAMIN D) 25 MCG (1000 UNIT) tablet Take 1,000 Units by mouth daily.    Marland Kitchen dexmethylphenidate (FOCALIN) 10 MG tablet 1 tablet at 1 PM 30 tablet 0  . Dexmethylphenidate HCl (FOCALIN XR) 25 MG CP24 Take 1 capsule by mouth every morning. 30 capsule 0  . ferrous gluconate (FERGON) 324 MG tablet Take 324 mg by mouth daily with breakfast.    . lamoTRIgine (LAMICTAL) 200 MG tablet TAKE (1) TABLET BY MOUTH TWICE DAILY. 180 tablet 1  . lithium carbonate 150 MG capsule Take 1 capsule (150 mg total) by mouth daily in the afternoon. 90 capsule 1  . loxapine (LOXITANE) 25 MG capsule 2 capsules at night for 1 week, then 3 each night. 75 capsule 1  . pantoprazole (PROTONIX) 40 MG tablet Take 1 tablet (40 mg total) by mouth daily. 90 tablet 2   No current facility-administered medications for this visit.    Medication Side Effects: Other: tremor and weight gain.  Dyskinesia appears better  SE bettter than they were.  Balance problems  intermittently  Allergies:  Allergies  Allergen Reactions  . Azithromycin Anaphylaxis  . Penicillins Anaphylaxis    DID THE REACTION INVOLVE: Swelling of the face/tongue/throat, SOB, or low BP? Yes Sudden or severe rash/hives, skin peeling, or the inside of the mouth or nose? Yes Did it require medical treatment? No When did it last happen? If all above answers are "NO", may proceed with cephalosporin use.  . Adhesive [Tape] Other (See Comments)  On bandaids    Past Medical History:  Diagnosis Date  . ADD (attention deficit disorder)   . Allergy    Seasonal  . Anemia    History of GI blood loss  . Anxiety   . Arthritis   . Bipolar 1 disorder ([place])   . Cancer ([place])   . Colon polyps   . Depression   . Diabetes mellitus ([place])   . Edema, lower extremity   . Epistaxis    Around 2011 or 2012, required cauterization.   . Esophageal stricture   . GERD (gastroesophageal reflux disease)   . Headache(784.0)   . Hyperlipidemia   . Interstitial cystitis   . Joint pain   . Lactose intolerance   . Lung cancer ([place]) 2002  . Obesity   . Osteoarthritis   . Palpitations   . Sleep apnea    Doesn't use a CPAP  . Swallowing difficulty     Family History  Problem Relation Age of Onset  . Arthritis Mother   . Hearing loss Mother   . Hyperlipidemia Mother   . Hypertension Mother   . Depression Mother   . Anxiety disorder Mother   . Obesity Mother   . Sudden death Mother   . Hypertension Father   . Diabetes Mellitus II Father   . Heart disease Father   . Arthritis Father   . Cancer Father        Brain  . COPD Father   . Diabetes Father   . Hyperlipidemia Father   . Sleep apnea Father   . Early death Sister        Aneroxia/Bulimic  . Depression Brother   . Early death Investment banker, corporate  . Depression Daughter   . Drug abuse Daughter   . Heart disease Daughter   . Hypertension Daughter   . Stroke Maternal Grandmother   . Hypertension Maternal  Grandmother   . Arthritis Maternal Grandfather   . Heart attack Maternal Grandfather   . Hearing loss Maternal Grandfather   . Colon cancer Neg Hx   . Esophageal cancer Neg Hx   . Rectal cancer Neg Hx     Social History   Socioeconomic History  . Marital status: Married    Spouse name: Not on file  . Number of children: 1  . Years of education: Not on file  . Highest education level: Not on file  Occupational History  . Occupation: Admin. assistant  Tobacco Use  . Smoking status: Never Smoker  . Smokeless tobacco: Never Used  Vaping Use  . Vaping Use: Never used  Substance and Sexual Activity  . Alcohol use: Yes    Alcohol/week: 1.0 standard drink    Types: 1 Glasses of wine per week    Comment: Moderate  . Drug use: No  . Sexual activity: Yes  Other Topics Concern  . Not on file  Social History Narrative   Pt lives in [place] with husband Lanny Hurst.  Followed by Dr. Clovis Pu for psychiatry and Rinaldo Cloud for therapy.   Social Determinants of Health   Financial Resource Strain:   . Difficulty of Paying Living Expenses: Not on file  Food Insecurity:   . Worried About Charity fundraiser in the Last Year: Not on file  . Ran Out of Food in the Last Year: Not on file  Transportation Needs:   . Lack of Transportation (Medical): Not on file  . Lack of  Transportation (Non-Medical): Not on file  Physical Activity:   . Days of Exercise per Week: Not on file  . Minutes of Exercise per Session: Not on file  Stress:   . Feeling of Stress : Not on file  Social Connections:   . Frequency of Communication with Friends and Family: Not on file  . Frequency of Social Gatherings with Friends and Family: Not on file  . Attends Religious Services: Not on file  . Active Member of Clubs or Organizations: Not on file  . Attends Archivist Meetings: Not on file  . Marital Status: Not on file  Intimate Partner Violence:   . Fear of Current or Ex-Partner: Not on file  .  Emotionally Abused: Not on file  . Physically Abused: Not on file  . Sexually Abused: Not on file    Past Medical History, Surgical history, Social history, and Family history were reviewed and updated as appropriate.   Please see review of systems for further details on the patient's review from today.   Objective:   Physical Exam:  There were no vitals taken for this visit.  Physical Exam Constitutional:      General: She is not in acute distress.    Appearance: She is well-developed.  Musculoskeletal:        General: No deformity.  Neurological:     Mental Status: She is alert and oriented to person, place, and time.     Cranial Nerves: No dysarthria.     Motor: Tremor present.     Coordination: Coordination normal.  Psychiatric:        Attention and Perception: Attention and perception normal. She does not perceive auditory or visual hallucinations.        Mood and Affect: Mood is anxious and depressed. Affect is not labile, blunt, angry, tearful or inappropriate.        Speech: Speech normal. Speech is not rapid and pressured or slurred.        Behavior: Behavior is not agitated. Behavior is cooperative.        Thought Content: Thought content normal. Thought content is not paranoid or delusional. Thought content does not include homicidal or suicidal ideation. Thought content does not include homicidal or suicidal plan.        Cognition and Memory: Cognition normal. She exhibits impaired recent memory.        Judgment: Judgment normal.     Comments: Insight fair. Sx no better than last visit      Lab Review:     Component Value Date/Time   NA 139 03/02/2020 1433   K 4.5 03/02/2020 1433   CL 103 03/02/2020 1433   CO2 22 03/02/2020 1433   GLUCOSE 83 03/02/2020 1433   GLUCOSE 102 (H) 01/19/2020 1158   BUN 15 03/02/2020 1433   CREATININE 0.76 03/02/2020 1433   CALCIUM 9.4 03/02/2020 1433   PROT 6.4 03/02/2020 1433   ALBUMIN 4.3 03/02/2020 1433   AST 18  03/02/2020 1433   ALT 8 03/02/2020 1433   ALKPHOS 88 03/02/2020 1433   BILITOT 0.2 03/02/2020 1433   GFRNONAA 83 03/02/2020 1433   GFRAA 96 03/02/2020 1433       Component Value Date/Time   WBC 6.0 03/02/2020 1433   WBC 7.4 01/19/2020 1158   RBC 4.39 03/02/2020 1433   RBC 4.19 01/19/2020 1158   HGB 13.7 03/02/2020 1433   HCT 40.4 03/02/2020 1433   PLT 219 03/02/2020 1433   MCV 92  03/02/2020 1433   MCH 31.2 03/02/2020 1433   MCH 30.3 01/19/2020 1158   MCHC 33.9 03/02/2020 1433   MCHC 32.1 01/19/2020 1158   RDW 12.6 03/02/2020 1433   LYMPHSABS 1.6 03/02/2020 1433   MONOABS 0.7 04/04/2019 1004   EOSABS 0.2 03/02/2020 1433   BASOSABS 0.0 03/02/2020 1433  Vitamin D level 33 on 10K units daily on 12/4/`9 Increased to prescription vitamin d 50K units Monday, Wed, Friday.  Rx sent in.   Lithium Lvl  Date Value Ref Range Status  10/21/2018 0.18 (L) 0.60 - 1.20 mmol/L Final    Comment:    Performed at [facility], [address] [place], [place] [address]     No results found for: PHENYTOIN, PHENOBARB, VALPROATE, CBMZ   .res Assessment: Plan:    Bipolar I disorder with rapid cycling ([place]) - Plan: loxapine (LOXITANE) 25 MG capsule, carbamazepine (TEGRETOL) 100 MG chewable tablet, cariprazine (VRAYLAR) capsule  Bipolar 1 disorder, mixed, moderate (HCC)  Generalized anxiety disorder  Attention deficit hyperactivity disorder (ADHD), predominantly inattentive type - Plan: dexmethylphenidate (FOCALIN) 10 MG tablet, Dexmethylphenidate HCl (FOCALIN XR) 25 MG CP24  Mild cognitive impairment  Tremor due to multiple drugs  Low vitamin D level  Greater than 50% of 30 min face to face time with patient was spent on counseling and coordination of care. We discussed the following: Megan Whitney has chronic rapid cycling bipolar disorder which is chronically unstable and has been difficult to control.  The rapid cycling is making it difficult to control frequency of depressive episodes and  the anxiety as well.  We have typically had to make frequent med changes.  We have had to reduce mood stabilizer dosages including Vraylar and Equetro she is very sensitive to carbamazepine because higher doses cause dizziness.   Overall rapid cycling pattern continues.   Still unstablecycling from mania to depression.  Increase loxapine to 40 mg HS with goal of weaning Vraylar bc pt doesn't tolerate enough Vraylar to manage mood.  Not change noted so far. Reduction in  Vraylar to 1.5 mg daily with goal of weaning it if possible without noticeable change so far..  Since last year when she was stable she started having hypomanic symptoms again.  We increased Equetro back to 300 mg nightly but she she has had balance problems.  It had stopped the manic symptoms but then she started having balance problems again and we had to switch it back to Equetro 300 and add carbamazepine immediate release 100 nightly.. Less neurological problems with reduction in Vraylar.   It could have been her prior neurologic symptoms were related to the combination of medications.  But I am hesitant to increase the Vraylar unless absolutely necessary.  ADD managed with Focalin.  Discussed the risk of stimulants including that that could contribute to mood instability and mood swings.  She appears to need it in order to function effectively at work.  She has a high residual anxiety.  It has been impossible to control all of her symptoms simultaneously without causing side effects.  She agrees.  Continue the following Discussed side effects of each medicine. Focalin XR 25 mg every morning and Focalin 10 mg immediate release daily Equetro 200 mg nightly Carbamazepine immediate release 100 mg nightly Lamotrigine 200 mg twice daily Lithium 150 mg nightly Continue Vraylar 1.5 mg every day (conisder reduction) Increase loxapine to 50 mg daily for 1 week and if no improvement then increase to 75 mg each night (or 3 of  the 25 mg  capsules)  Discussed potential metabolic side effects associated with atypical antipsychotics, as well as potential risk for movement side effects. Advised pt to contact office if movement side effects occur.  She may be having some mild TD with toe movement.  Option treat tremor.  Conintue amanatadine 100 mg AM prn tremor.  Disc SE meds and this is heightened by the complication of necessary polypharmacy.  FU results of PT for balance.    Supportive therapy in terms of dealing with husband's addiction.  She's interested in Bipolar Support Group and info given.  Requires frequent FU DT chronic instability.  FU 4 weeks.  Lynder Parents MD, DFAPA  Please see After Visit Summary for patient specific instructions. Increase loxapine to 4 capsules 1 to 2 hours before bedtime   Future Appointments  Date Time Provider [place]  05/06/2020 11:00 AM Shanon Ace, LCSW [facility] None  05/13/2020  8:00 AM Shanon Ace, LCSW [facility] None  05/19/2020  8:00 AM Shanon Ace, LCSW [facility] None  05/25/2020  7:40 AM [name], DO [facility] None  05/27/2020  8:00 AM Shanon Ace, LCSW [facility] None  05/29/2020 10:15 AM Felipa Furnace, DPM [facility]  06/09/2020 10:15 AM Cottle, Billey Co., MD [facility] None  06/10/2020  1:00 PM Shanon Ace, LCSW [facility] None  06/18/2020  9:00 AM Shanon Ace, LCSW [facility] None    No orders of the defined types were placed in this encounter.     -------------------------------

## 2020-05-04 NOTE — Patient Instructions (Signed)
Increase loxapine to 50 mg daily for 1 week and if no improvement then increase to 75 mg each night (or 3 of the 25 mg capsules)

## 2020-05-05 ENCOUNTER — Telehealth: Payer: Self-pay | Admitting: Psychiatry

## 2020-05-05 NOTE — Telephone Encounter (Signed)
Patient given recommendation to give a longer time and if is causing insomnia to change time of day of dosing. She agreed and will call back with any problems or concerns.

## 2020-05-05 NOTE — Telephone Encounter (Signed)
No do not decrease loxapine.  I've never seen it cause insomnia.  I think this was a fluke.  Need to give the med a longer period to help her at 50 mg daily.  If she suspects it causing insomnia she can take it in the morning or if she has 25 mg capsules, she can take 25 mg BID.

## 2020-05-05 NOTE — Telephone Encounter (Signed)
Pt called to report increase Loxapine to 50 mg HS could not stay a sleep last night. Should she decrease back to 40 mg HS? Contact @ [phone]

## 2020-05-06 ENCOUNTER — Other Ambulatory Visit: Payer: Self-pay

## 2020-05-06 ENCOUNTER — Ambulatory Visit (INDEPENDENT_AMBULATORY_CARE_PROVIDER_SITE_OTHER): Payer: BC Managed Care – PPO | Admitting: Psychiatry

## 2020-05-06 DIAGNOSIS — F319 Bipolar disorder, unspecified: Secondary | ICD-10-CM

## 2020-05-06 NOTE — Progress Notes (Signed)
[facility] Counselor/Therapist Progress Note  Patient ID: Megan Whitney, MRN: [id number],    Date: 05/06/2020  Time Spent: 60 minutes  11:00am to 12:00noon  Treatment Type: Individual Therapy  Reported Symptoms: anxiety, depression, some tearfulness, irritable    Mental Status Exam:  Appearance:   Neat     Behavior:  Appropriate and Sharing  Motor:  Normal  Speech/Language:   Clear and Coherent  Affect:  anxious, depressed  Mood:  anxious, depressed and sad  Thought process:  goal directed  Thought content:    obsessiveness  Sensory/Perceptual disturbances:    WNL  Orientation:  oriented to person, place, time/date, situation, day of week, month of year and year  Attention:  Good  Concentration:  Fair  Memory:  some forgetfulness "worse when stressed"  Fund of knowledge:   Good  Insight:    Fair  Judgment:   Good and Fair  Impulse Control:  Good   Risk Assessment: Danger to Self:  No Self-injurious Behavior: No Danger to Others: No Duty to Warn:no Physical Aggression / Violence:No  Access to Firearms a concern: No  Gang Involvement:No   Subjective: Patient today reports anxiety, depression, frustration but also feels she has done better in some ways of handling things. Has appropriately reached out to friends in her knitting group for emotional support.   Interventions: Solution-Oriented/Positive Psychology and Ego-Supportive  Diagnosis:   ICD-10-CM   1. Bipolar I disorder with rapid cycling ([place])  F31.9     Plan: Patient not signing tx plan on computer screen due to [place].  Treatment Goals: Goals remain on plan as patient works on strategies to meet her goals. Progress is noted each visit in "Progress" section of goal plan.  Long Term Goal: Reduce overall level, frequency, and intensity of the anxiety so that daily functioning is not impaired.  Short Term Goal: 1.Increase understanding of the beliefs and messages that produce the worry and  anxiety.  Strategies: 1.Help client develop reality-based positive cognitive messages/self-talk. 2. Develop a "coping card" or other reminder which coping strategies are recorded for patient's later use .  PROGRESS: Patient in today quite tearful from the start of session.  Reports depression, anxiety, irritability, and "assuming the worst about most things."  Focused more on her anxiety today and did some good work on automatic anxious/depressive thoughts and "assuming the worst" which feeds her anxiety. Seemed more motivated today. Is both anxious and looking forward to her retirement, and also showing more interest in her own self-care.  Her diligence in working on the anxiety issues today was more noticeable, she ended up taking notes as she stated "I need to be working on this at home as well".  Some tearfulness initially but her affect became more leveled out and positive during session.  Was concerned today about an issue concerning her husband's health and a PSA level.  She was tearful initially and talking about this issue but admitted that they have not spoken with the doctor yet and do not really know facts.  He will have an appointment within the next couple weeks where he can find out more information.  He will have an appointment within a couple weeks to get more information.  After processing this for a while in session, patient was more calm and seemed to understand better the relationship between her anxiety on the front and and how it aggravates and perpetuates her depression in a lot of the situation she reports.  She  has some specific exercises to work on in between sessions and will return within 2 weeks.  Denies any SI and seems to show some increased determination and some changes that she needs to make, while better understanding what is in her control and what is not in her control.  Encouraged to remain on meds as prescribed and follow through on her homework in between sessions,  practicing the strategies we worked on today to better manage her anxiety and automatic anxious/negative thoughts.  Goal review and progress/challenges noted with patient.  Next appt within 2 weeks.   Shanon Ace, LCSW

## 2020-05-12 ENCOUNTER — Other Ambulatory Visit: Payer: Self-pay | Admitting: Psychiatry

## 2020-05-13 ENCOUNTER — Ambulatory Visit: Payer: BC Managed Care – PPO | Admitting: Psychiatry

## 2020-05-13 NOTE — Telephone Encounter (Signed)
Please review

## 2020-05-15 ENCOUNTER — Ambulatory Visit (INDEPENDENT_AMBULATORY_CARE_PROVIDER_SITE_OTHER): Payer: BC Managed Care – PPO | Admitting: Psychiatry

## 2020-05-15 ENCOUNTER — Other Ambulatory Visit: Payer: Self-pay

## 2020-05-15 DIAGNOSIS — F319 Bipolar disorder, unspecified: Secondary | ICD-10-CM

## 2020-05-15 NOTE — Progress Notes (Signed)
[facility] Counselor/Therapist Progress Note  Patient ID: Megan Whitney, MRN: [id number],    Date: 05/15/2020  Time Spent: 60 minutes  10:00am to 11:00am  Treatment Type: Individual Therapy  Reported Symptoms: anxiety "high", depression, distracted, and "all these things are affecting my cognitive functioning" and paying attention to details.  Mental Status Exam:  Appearance:   Casual     Behavior:  Appropriate and Sharing  Motor:  Normal  Speech/Language:   Clear and Coherent  Affect:  anxious, depressed  Mood:  anxious, depressed and some tearfulness  Thought process:  goal directed  Thought content:    some obsessiveness  Sensory/Perceptual disturbances:    WNL  Orientation:  oriented to person, place, time/date, situation, day of week, month of year and year  Attention:  Good  Concentration:  Fair  Memory:  some forgetfulness and worse under stress  Fund of knowledge:   Good  Insight:    Fair  Judgment:   Good  Impulse Control:  Good and Fair   Risk Assessment: Danger to Self:  No Self-injurious Behavior: No Danger to Others: No Duty to Warn:no Physical Aggression / Violence:No  Access to Firearms a concern: No  Gang Involvement:No   Subjective: Patient in today reporting "high" anxiety, depression, tearfulness, distracted, all of which affects "my cognitive skills and I'm not as sharp in the moment."   Interventions: Solution-Oriented/Positive Psychology and Ego-Supportive  Diagnosis:   ICD-10-CM   1. Bipolar I disorder with rapid cycling ([place])  F31.9      Plan: Patient not signing tx plan on computer screen due to [place].  Treatment Goals: Goals remain on plan as patient works on strategies to meet her goals. Progress is noted each visit in "Progress" section of goal plan.  Long Term Goal: Reduce overall level, frequency, and intensity of the anxiety so that daily functioning is not impaired.  Short Term Goal: 1.Increase understanding  of the beliefs and messages that produce the worry and anxiety.  Strategies: 1.Help client develop reality-based positive cognitive messages/self-talk. 2. Develop a "coping card" or other reminder which coping strategies are recorded for patient's later use .  PROGRESS: Patient in today reporting "high" anxiety, depression, some tearfulness but less than last session. Processed a lot of her anxious/negative thoughts and feelings in reference to issues with her husband.  Patient today is showing more perseverance and some strength and getting through some current stressors with husband.  She is finding ways to "give him more space" when he is very difficult to communicate with, and feels that she is better able to care for herself in that way rather than trying to respond to him or intervene.  Worked again some today on her automatic anxious/depressive thoughts and her trying not to "assume the worst" which she knows is never helpful for her.  Her diligence and trying to work on some anxiety strategies and better manage difficult times with husband has been more noticeable most recently, and she reports feeling that she is trying to be more proactive rather than reactive, with him at home.  States today "I do not feel like I am becoming manic but the last few days have been more difficult with husband due to his poor choices. "Patient is to continue working on skills noted above while also practicing good self-care to include getting out of the house at least daily, walking when possible, spending time with her dogs, being in contact with family and friends that are  supportive, doing things she enjoys such as knitting or adult coloring books, and working on her automatic anxious/negative thoughts with the strategies we have reviewed in sessions, and trying to keep herself focused on the present.  Goal review and progress/challenges noted with patient.  Next appt within 2 weeks.   Shanon Ace,  LCSW

## 2020-05-18 ENCOUNTER — Telehealth: Payer: Self-pay | Admitting: Psychiatry

## 2020-05-18 NOTE — Telephone Encounter (Signed)
Please review

## 2020-05-18 NOTE — Telephone Encounter (Signed)
The plan was to increase loxapine to 75-100 mg nightly.  I'm not clear what she's asking to be changed.  Please clarify.

## 2020-05-18 NOTE — Telephone Encounter (Signed)
Pt said that she is still having trouble sleeping at night. Pt asked if she can get her meds changed as discussed in last visit. Please call.

## 2020-05-19 ENCOUNTER — Ambulatory Visit: Payer: BC Managed Care – PPO | Admitting: Psychiatry

## 2020-05-19 NOTE — Telephone Encounter (Signed)
No she was just asking if it's okay to split up her dose of loxapine to see if it helps with her insomnia. Advised her to take 25 mg in the am and 50 mg at hs, if that still didn't help to just take 25 mg at hs and 50 mg in the am. She agreed and will call back if not helping.

## 2020-05-19 NOTE — Telephone Encounter (Signed)
Agreed.  Thank you

## 2020-05-20 ENCOUNTER — Ambulatory Visit (INDEPENDENT_AMBULATORY_CARE_PROVIDER_SITE_OTHER): Payer: BC Managed Care – PPO | Admitting: Family Medicine

## 2020-05-25 ENCOUNTER — Ambulatory Visit (INDEPENDENT_AMBULATORY_CARE_PROVIDER_SITE_OTHER): Payer: BC Managed Care – PPO | Admitting: Family Medicine

## 2020-05-25 ENCOUNTER — Other Ambulatory Visit: Payer: Self-pay

## 2020-05-25 ENCOUNTER — Other Ambulatory Visit: Payer: Self-pay | Admitting: Psychiatry

## 2020-05-25 ENCOUNTER — Encounter (INDEPENDENT_AMBULATORY_CARE_PROVIDER_SITE_OTHER): Payer: Self-pay | Admitting: Family Medicine

## 2020-05-25 VITALS — BP 110/80 | HR 101 | Temp 98.9°F | Ht 60.0 in | Wt 161.0 lb

## 2020-05-25 DIAGNOSIS — E7849 Other hyperlipidemia: Secondary | ICD-10-CM | POA: Diagnosis not present

## 2020-05-25 DIAGNOSIS — E669 Obesity, unspecified: Secondary | ICD-10-CM | POA: Diagnosis not present

## 2020-05-25 DIAGNOSIS — F329 Major depressive disorder, single episode, unspecified: Secondary | ICD-10-CM | POA: Diagnosis not present

## 2020-05-25 DIAGNOSIS — Z9189 Other specified personal risk factors, not elsewhere classified: Secondary | ICD-10-CM | POA: Diagnosis not present

## 2020-05-25 DIAGNOSIS — Z6831 Body mass index (BMI) 31.0-31.9, adult: Secondary | ICD-10-CM | POA: Diagnosis not present

## 2020-05-25 DIAGNOSIS — F319 Bipolar disorder, unspecified: Secondary | ICD-10-CM

## 2020-05-25 MED ORDER — ATORVASTATIN CALCIUM 20 MG PO TABS: 20.0000 mg | ORAL_TABLET | Freq: Every day | ORAL | 0 refills | Status: DC

## 2020-05-25 NOTE — Telephone Encounter (Signed)
Review.

## 2020-05-26 NOTE — Progress Notes (Signed)
Chief Complaint:   OBESITY Megan Whitney is here to discuss her progress with her obesity treatment plan along with follow-up of her obesity related diagnoses. Megan Whitney is on the Category 1 Plan and states she is following her eating plan approximately 65-70% of the time. Megan Whitney states she is exercising for 0 minutes 0 times per week.  Today's visit was #: 5 Starting weight: 188 lbs Starting date: 03/02/2020 Today's weight: 161 lbs Today's date: 05/25/2020 Total lbs lost to date: 27 lbs Total lbs lost since last in-office visit: 7 lbs  Interim History: Megan Whitney is following the plan and says she likes it a lot.  She says she binged a little on pineapple upside down cake her husband made.  She is retiring this Thursday and is having a hard time, but can return to PT if desired.  She has mixed emotions and some increased stress with being home with her husband more.  He is an alcoholic, but is not abusive, etc.  She is just worried she will not have "a purpose".  She says her husband cooks for her and is very supportive of her weight loss journey.  Subjective:   1. Other hyperlipidemia Megan Whitney has hyperlipidemia and has been trying to improve her cholesterol levels with intensive lifestyle modification including a low saturated fat diet, exercise and weight loss. She denies any chest pain, claudication or myalgias.  She is tolerating Lipitor well.  Denies side effects.  Not fasting today.  Lab Results  Component Value Date   ALT 8 03/02/2020   AST 18 03/02/2020   ALKPHOS 88 03/02/2020   BILITOT 0.2 03/02/2020   Lab Results  Component Value Date   CHOL 238 (H) 03/02/2020   HDL 90 03/02/2020   LDLCALC 135 (H) 03/02/2020   TRIG 77 03/02/2020   CHOLHDL 2.6 03/02/2020   2. Major depressive disorder with current active episode, unspecified depression episode severity, unspecified whether recurrent She sees Dr. Clovis Whitney monthly for Psychiatry.  She sees a Social worker every 1-2 weeks.  She has been  struggling lately, but feels stable.  Denies thoughts of harming herself or others.  3. At risk for heart disease Due to Megan Whitney's current state of health and medical condition(s), she is at a higher risk for heart disease.   This puts the patient at much greater risk to subsequently develop cardiopulmonary conditions that can significantly affect patient's quality of life in a negative manner as well.    At least 15+ minutes was spent on counseling Megan Whitney about these concerns today and I stressed the importance of reversing risks factors of obesity, esp truncal and visceral fat, hypertension, hyperlipidemia, pre-diabetes.   Initial goal is to lose at least 5-10% of starting weight to help reduce these risk factors.   Counseling: Intensive lifestyle modifications discussed with Megan Whitney as most appropriate first line treatment.  she will continue to work on diet, exercise and weight loss efforts.  We will continue to reassess these conditions on a fairly regular basis in an attempt to decrease patient's overall morbidity and mortality.  Evidence-based interventions for health behavior change were utilized today including the discussion of self monitoring techniques, problem-solving barriers and SMART goal setting techniques.  Specifically regarding patient's less desirable eating habits and patterns, we employed the technique of small changes when Megan Whitney has not been able to fully commit to her prudent nutritional plan.  Assessment/Plan:   1. Other hyperlipidemia Cardiovascular risk and specific lipid/LDL goals reviewed.  We discussed several lifestyle  modifications today and Megan Whitney will continue to work on diet, exercise and weight loss efforts. Orders and follow up as documented in patient record.  Check FLP, ALT at next visit.  Continue weight loss and low salt/trans fat meal plan.  Counseling Intensive lifestyle modifications are the first line treatment for this issue. . Dietary changes: Increase  soluble fiber. Decrease simple carbohydrates. . Exercise changes: Moderate to vigorous-intensity aerobic activity 150 minutes per week if tolerated. . Lipid-lowering medications: see documented in medical record.  -Refill atorvastatin (LIPITOR) 20 MG tablet; Take 1 tablet (20 mg total) by mouth at bedtime.  Dispense: 30 tablet; Refill: 0  2. Major depressive disorder with current active episode, unspecified depression episode severity, unspecified whether recurrent She will cal land schedule weekly CBT appointments.  She is being seen this Wednesday by her counselor.  She is tearful in the office and we discussed that she can work part time or volunteer at [facility] to help others struggling with depression, etc.  She will start looking at things she would like to do in the future and focus on self-care more.  3. At risk for heart disease Due to Megan Whitney's current state of health and medical condition(s), she is at a higher risk for heart disease.   This puts the patient at much greater risk to subsequently develop cardiopulmonary conditions that can significantly affect patient's quality of life in a negative manner as well.    At least 15+ minutes was spent on counseling Megan Whitney about these concerns today and I stressed the importance of reversing risks factors of obesity, esp truncal and visceral fat, hypertension, hyperlipidemia, pre-diabetes.   Initial goal is to lose at least 5-10% of starting weight to help reduce these risk factors.   Counseling: Intensive lifestyle modifications discussed with Megan Whitney as most appropriate first line treatment.  she will continue to work on diet, exercise and weight loss efforts.  We will continue to reassess these conditions on a fairly regular basis in an attempt to decrease patient's overall morbidity and mortality.  Evidence-based interventions for health behavior change were utilized today including the discussion of self monitoring techniques, problem-solving  barriers and SMART goal setting techniques.  Specifically regarding patient's less desirable eating habits and patterns, we employed the technique of small changes when Megan Whitney has not been able to fully commit to her prudent nutritional plan.  4. Class 1 obesity with serious comorbidity and body mass index (BMI) of 31.0 to 31.9 in adult, unspecified obesity type Megan Whitney is currently in the action stage of change. As such, her goal is to continue with weight loss efforts. She has agreed to the Category 1 Plan.   Exercise goals: All adults should avoid inactivity. Some physical activity is better than none, and adults who participate in any amount of physical activity gain some health benefits.  Behavioral modification strategies: ways to avoid night time snacking, emotional eating strategies and planning for success.  Megan Whitney has agreed to follow-up with our clinic in 2 weeks, fasting. She was informed of the importance of frequent follow-up visits to maximize her success with intensive lifestyle modifications for her multiple health conditions.   Objective:   Blood pressure 110/80, pulse (!) 101, temperature 98.9 F (37.2 C), height 5' (1.524 m), weight 161 lb (73 kg), SpO2 96 %. Body mass index is 31.44 kg/m.  General: Cooperative, alert, well developed, in no acute distress. HEENT: Conjunctivae and lids unremarkable. Cardiovascular: Regular rhythm.  Lungs: Normal work of breathing. Neurologic: No  focal deficits.   Lab Results  Component Value Date   CREATININE 0.76 03/02/2020   BUN 15 03/02/2020   NA 139 03/02/2020   K 4.5 03/02/2020   CL 103 03/02/2020   CO2 22 03/02/2020   Lab Results  Component Value Date   ALT 8 03/02/2020   AST 18 03/02/2020   ALKPHOS 88 03/02/2020   BILITOT 0.2 03/02/2020   Lab Results  Component Value Date   HGBA1C 5.2 03/02/2020   HGBA1C 5.6 04/04/2019   Lab Results  Component Value Date   INSULIN 4.7 03/02/2020   Lab Results  Component Value  Date   TSH 2.830 03/02/2020   Lab Results  Component Value Date   CHOL 238 (H) 03/02/2020   HDL 90 03/02/2020   LDLCALC 135 (H) 03/02/2020   TRIG 77 03/02/2020   CHOLHDL 2.6 03/02/2020   Lab Results  Component Value Date   WBC 6.0 03/02/2020   HGB 13.7 03/02/2020   HCT 40.4 03/02/2020   MCV 92 03/02/2020   PLT 219 03/02/2020   Lab Results  Component Value Date   IRON 114 03/02/2020   TIBC 318 03/02/2020   FERRITIN 57 03/02/2020   Attestation Statements:   Reviewed by clinician on day of visit: allergies, medications, problem list, medical history, surgical history, family history, social history, and previous encounter notes.  I, Water quality scientist, CMA, am acting as Location manager for [employer], DO.  I have reviewed the above documentation for accuracy and completeness, and I agree with the above. -  [name], DO

## 2020-05-27 ENCOUNTER — Telehealth: Payer: Self-pay | Admitting: Psychiatry

## 2020-05-27 ENCOUNTER — Other Ambulatory Visit: Payer: Self-pay

## 2020-05-27 ENCOUNTER — Ambulatory Visit (INDEPENDENT_AMBULATORY_CARE_PROVIDER_SITE_OTHER): Payer: BC Managed Care – PPO | Admitting: Psychiatry

## 2020-05-27 DIAGNOSIS — F319 Bipolar disorder, unspecified: Secondary | ICD-10-CM

## 2020-05-27 NOTE — Progress Notes (Signed)
      [facility] Counselor/Therapist Progress Note  Patient ID: Megan Whitney, MRN: [id number],    Date: 05/27/2020  Time Spent: 60 minutes   8:00am to 9:00am  Treatment Type: Individual Therapy  Reported Symptoms: anxiety, depression "a little better", occasional irritability, " no impulsiveness"  Mental Status Exam:  Appearance:   Neat     Behavior:  Appropriate, Sharing and Motivated  Motor:  Normal  Speech/Language:   Clear and Coherent  Affect:  anxiety  Mood:  anxious  Thought process:  goal directed  Thought content:    WNL  Sensory/Perceptual disturbances:    WNL  Orientation:  oriented to person, place, time/date, situation, day of week, month of year and year  Attention:  Good  Concentration:  Good  Memory:  forgetfulness and feels it can be med-related at times; states she will let her Drs know  [employer] of knowledge:   Good  Insight:    Good  Judgment:   Good  Impulse Control:  Good   Risk Assessment: Danger to Self:  No Self-injurious Behavior: No Danger to Others: No Duty to Warn:no Physical Aggression / Violence:No  Access to Firearms a concern: No  Gang Involvement:No   Subjective: Patient today reports being highly anxious "but able to manage it using tools worked on in sessions including staying in the present and looking for more positives than negatives."   Interventions: Cognitive Behavioral Therapy and Solution-Oriented/Positive Psychology  Diagnosis:   ICD-10-CM   1. Bipolar I disorder with rapid cycling ([place])  F31.9     Plan: Patient not signing tx plan on computer screen due to [place].  Treatment Goals: Goals remain on plan as patient works on strategies to meet her goals. Progress is noted each visit in "Progress" section of goal plan.  Long Term Goal: Reduce overall level, frequency, and intensity of the anxiety so that daily functioning is not impaired.  Short Term Goal: 1.Increase understanding of the beliefs and messages  that produce the worry and anxiety.  Strategies: 1.Help client develop reality-based positive cognitive messages/self-talk. 2. Develop a "coping card" or other reminder which coping strategies are recorded for patient's later use .  PROGRESS: Patient in today reporting "high anxiety but depression is some better".  Retiring from her job of 15 yrs tomorrow.  Things stressful at work but people have appreciated her work.  The day after retiring, she and husband are taking week of vacation to beach. Anxious thoughts re: "possible health issues that may happen, what may go wrong, etc."  Processed some of her repeated anxieties and how she can work hard to interrupt them, challenge them, and replace with more realistic, positive, and empowering thought that do not support anxiety. Finds ways to give herself and husband "more space at times", being more proactive versus reactive. States he is not feeling any mood instability, "and thought I really would be at this point, but I don't".  Feels that staying on her meds and continued work with her goals will strengthen some of her gains.  Staying in the present and looking for positives, she feels, is really something "I need to focus on daily."  Goal review and progress/challenges noted with patient.  Next appt within 2 weeks.   Shanon Ace, LCSW

## 2020-05-27 NOTE — Telephone Encounter (Signed)
Noted.  It is okay for her to continue the loxapine is 1 in the morning and 1 in the evening with no change indicated.

## 2020-05-27 NOTE — Telephone Encounter (Signed)
She's just changed times of when taking loxapine, which seems to be working better for her.

## 2020-05-27 NOTE — Telephone Encounter (Signed)
Pt was in today for a therapy appointment and wanted dr. Clovis Pu to know that the loxapine that was prescribed to her she can't take it the way he wanted which was two in the am and one at night. Taking two in the am she was unable to function and to sleepy. So she is taking one in the am and one in the pm and she is doing well and sleeping. She wanted dr. Clovis Pu to know and if she needs to change anything please call her at [phone]

## 2020-05-29 ENCOUNTER — Ambulatory Visit: Payer: BC Managed Care – PPO | Admitting: Podiatry

## 2020-06-09 ENCOUNTER — Other Ambulatory Visit: Payer: Self-pay

## 2020-06-09 ENCOUNTER — Ambulatory Visit (INDEPENDENT_AMBULATORY_CARE_PROVIDER_SITE_OTHER): Payer: BC Managed Care – PPO | Admitting: Psychiatry

## 2020-06-09 ENCOUNTER — Encounter: Payer: Self-pay | Admitting: Psychiatry

## 2020-06-09 DIAGNOSIS — F411 Generalized anxiety disorder: Secondary | ICD-10-CM | POA: Diagnosis not present

## 2020-06-09 DIAGNOSIS — G251 Drug-induced tremor: Secondary | ICD-10-CM

## 2020-06-09 DIAGNOSIS — G3184 Mild cognitive impairment, so stated: Secondary | ICD-10-CM | POA: Diagnosis not present

## 2020-06-09 DIAGNOSIS — F319 Bipolar disorder, unspecified: Secondary | ICD-10-CM

## 2020-06-09 DIAGNOSIS — F900 Attention-deficit hyperactivity disorder, predominantly inattentive type: Secondary | ICD-10-CM

## 2020-06-09 MED ORDER — CARIPRAZINE HCL 1.5 MG PO CAPS: 1.5000 mg | ORAL_CAPSULE | Freq: Every day | ORAL | 1 refills | Status: DC

## 2020-06-09 MED ORDER — EQUETRO 200 MG PO CP12: 200.0000 mg | ORAL_CAPSULE | Freq: Every evening | ORAL | 5 refills | Status: DC

## 2020-06-09 MED ORDER — DEXMETHYLPHENIDATE HCL ER 20 MG PO CP24: 20.0000 mg | ORAL_CAPSULE | Freq: Every day | ORAL | 0 refills | Status: DC

## 2020-06-09 NOTE — Progress Notes (Signed)
Megan Whitney [id number] 1956-04-10 64 y.o.     Subjective:   Patient ID:  Megan Whitney is a 64 y.o. (DOB 1956/01/17) female.   Chief Complaint:  Chief Complaint  Patient presents with  . Follow-up  . Depression  . Anxiety  . Stress    Depression        Associated symptoms include no decreased concentration and no suicidal ideas.  Past medical history includes anxiety.   Anxiety Symptoms include nervous/anxious behavior. Patient reports no confusion, decreased concentration, dizziness, nausea or suicidal ideas.    Medication Refill Associated symptoms include arthralgias. Pertinent negatives include no nausea or weakness.   lSusan ROGUE PAUTLER is  follow-up of r chronic mood swings and anxiety and frequent changes in medications.   At visit December 27, 2018.  Focalin XR was increased from 20 mg to 25 mg daily to help with focus and attention and potentially mood.  When seen February 13, 2019.  In an effort to reduce mood cycling we reduce fluoxetine to 20 mg daily.  At visit August 2020.  No meds were changed.  She continued the following: Focalin XR 25 mg every morning and Focalin 10 mg immediate release daily Equetro 200 mg nightly Fluoxetine 20 mg daily Lamotrigine 200 mg twice daily Lithium 150 mg nightly Vraylar 3 mg daily  She called back November 4 after seeing her therapist stating that she was having some hypomanic symptoms with reduced sleep and increased energy.  This potentiality had been discussed and the decision was made to increase Equetro from 200 mg nightly to 300 mg nightly.  seen August 12, 2019.  Because of balance problems she did not tolerate Equetro 300 mg nightly and it was changed to Equetro 200 mg nightly plus immediate release carbamazepine 100 mg nightly.  Her mood had not been stable enough on Equetro 200 mg nightly alone. Less balance problems with change in CBZ.  seen September 23, 2019.  The following was changed: For bipolar mixed increase  CBZ IR to 200 mg HS.  Disc fall and balance risks.For bipolar mixed increase CBZ IR to 200 mg HS.  Disc fall and balance risks.  She called back October 23, 2019 stating she had had another fall and felt it was due to the medication.  Therefore carbamazepine immediate release was reduced from 200 mg nightly to 100 mg nightly.  The Equetro is unchanged.  Last seen November 04, 2019.  The following was noted:  Better at the moment but balance is still somewhat of a problem.  Started PT to help balance.  Had a fall after tripping on a curb and hit her head on sidewalk.  Got a concussion with nausea and HA and dizziness and light sensitivity.  Not over it.  Concentration problems.  Has gotten back to work after a week.   Mood sx pretty good with some mild depression.  Nothing severe.  Trying to minimize stress and self care as much as possible.  No manic sx lately and sleeping fairly well.  No racing thoughts.   Working another year and plans to retire but H alcoholic and not sure it will be good to be there all the time. Seeing therapist q 2 weeks.  Therapy helping . Recent serum vitamin D level was determined to be low at 33.  The goal and chronically depressed patient's is in the 50s if possible.  So her vitamin D was increased on August 08, 2018 or thereabouts.  Checked vitamin  D level again and this time it was high at 120 and so it was stopped.  She's restarted per PCP at 1000 units daily.  01/06/2020 appointment the following is noted: Still on: Focalin XR 25 mg every morning and Focalin 10 mg immediate release daily Equetro 200 mg nightly Carbamazepine immediate release 100 mg nightly Fluoxetine 20 mg daily Lamotrigine 200 mg twice daily Lithium 150 mg nightly Vraylar 3 mg daily Not good manic.  Angry.  Missed 2 days bc sx.  Last week vacation which didn't go well.  Crying last week and missed a day.  "Pissed off at the whole world" but also depressed and hard to get OOB today.  Everything makes  me angry.   Blows up without control.  Then regrets it.  Sleep irregular lately. Finished PT which might have helped some but still balance problems. Plan: Cannot increase carbamazepine due to balance issues Temporarily Ativan for agitation 0.5 mg tablets  DT mania stop fluoxetine If fails trial loxapine  01/15/2020 patient called after hours with suicidal thoughts and patient was to go to the [facility] Patient ultimately admitted to [facility] psychiatric unit.  Dr. Clovis Pu spoke with clinical pharmacist they are giving history of medication experience and recommendation for loxapine.  Patient hospital stay for 3 days and discharged on loxapine 10 mg nightly as the new medication.  02/10/2020 phone call patient complaining of insomnia.  Loxapine was increased from 10 to 20 mg nightly due to recent insomnia with mania.  02/14/2020 appointment with the following noted: Lately in tears Monday and Tuesday convinced she couldn't do her job.  Better last couple of days.  Motivation is not real good but not depressed like Monday and Tuesday. This week missing some meds bc couldn't get like Focalin.  Been taking other meds. No sig manic sx.  Sleep is better with more loxapine about 8 hours. Anxiety is chronic.  No SE loxapine so far unless a little dizzy here and there. No med changes.  02/25/2020 appointment urgently made after patient was recently hospitalized.  The following is noted: Unstable.  Today manic driving erratically.  Talking a mile a minute.  Not thinking clearly.  Angry.  Slept OK last night.  Hyperactive with poor productivity for a couple of days.  Weekend OK overall.   No falls lately. More tremor lately.  Retiring July 30.  Plan: For tremor amantadine 100 mg twice a day if needed. Increase loxapine to 3 capsules 1 to 2 hours before bedtime Reduce Vraylar to 1.5 mg daily or 3 mg every other day.   04/01/2020 appointment with the following  noted: Amantadine hs caused NM. Low grade depression for a couple of weeks.  Not severe.   Extended work date 06/04/20 to retire date.  She feels OK about it in some ways but doesn't feel fully up to it.  Doesn't remember when hypomania resolved from last visit.   Sleep is much better now uninterrupted. Hard to remember lithium at lunch. Still has tremor but better with amantadine.  Anxiety still through the roof. Plan: Increase loxapine 40 mg HS.  05/04/20 appt with the following noted:  Increased loxapine to 40.  Anxiety no better.  All kinds of reasons including worry about retirement and paying for things, but worry is probably exaggerated and H say sit is. Sleep good usually.  No SE noted.  Not making her sleep more with change. Still some manic sx including shortly after last  visit and then depressed until the last week.  Irritable and angry. Some panic with SOB and fear of MI. Plan: Continue Vraylar 1.5 mg every day (conisder reduction) Increase loxapine to 50 mg daily for 1 week and if no improvement then increase to 75 mg each night (or 3 of the 25 mg capsules)  Multiple phone calls between appointments with the patient complaining loxapine was causing insomnia.  She has adjusted on timing and dose as she felt it was necessary to make it tolerable because when she takes it in the morning she gets sleepy if she takes very much.  06/09/20 appt Noted: Max tolerated loxapine 25 mg BID.  More than that HS gives strange dreams and difficult to go back to sleep and more in AM too sedated. Not doing well.  Anxiety through the roof.  Did ok with vacation but home worries about everything.   Retired.  Has a lot of time to generally worry.  Started reading again for the first time in awhile.  That's helpful. Takes a while to adjust to retirement.  Anxiety and depression feed each other.  Less interest in some activities.  Later in afternoon is not quite as anxious. Hard to drive with anxiety.      Past Psychiatric Medication Trials: Vrayla 4.5 SE mouth movements reduced to 3 mg 3/20 lithium 150,  Latuda 80, Trileptal 450, olanzapine, Seroquel, risperidone, Abilify Focalin,  Ritalin,  Depakote, Equetro 300 balance issues, Lamictal 200 twice daily, fluoxetine 60,  buspirone, sertraline 100, Wellbutrin history of facial tics, paroxetine cognitive side effects ropinirole, amantadine, Sinemet, Artane, Cogentin, trazodone hangover, Ambien hangover,  Review of Systems:  Review of Systems  HENT: Positive for tinnitus.        Chirping cricket sounds in hears since January  Gastrointestinal: Negative for nausea.  Musculoskeletal: Positive for arthralgias. Negative for gait problem.  Neurological: Positive for tremors. Negative for dizziness, seizures, syncope, weakness and light-headedness.       Still balance problems. No falls lately.  Psychiatric/Behavioral: Positive for depression and dysphoric mood. Negative for agitation, behavioral problems, confusion, decreased concentration, hallucinations, self-injury, sleep disturbance and suicidal ideas. The patient is nervous/anxious. The patient is not hyperactive.   No falls since here. Not currently depressed but unable to remove this from the list.  Medications: I have reviewed the patient's current medications.  Current Outpatient Medications  Medication Sig Dispense Refill  . amantadine (SYMMETREL) 100 MG capsule Take 1 capsule (100 mg total) by mouth daily. 90 capsule 0  . atorvastatin (LIPITOR) 20 MG tablet Take 1 tablet (20 mg total) by mouth at bedtime. 30 tablet 0  . carbamazepine (TEGRETOL) 100 MG chewable tablet Chew 1 tablet (100 mg total) by mouth at bedtime. 90 tablet 0  . cariprazine (VRAYLAR) capsule Take 1 capsule (1.5 mg total) by mouth daily. 30 capsule 1  . cetirizine (ZYRTEC) 10 MG tablet Take 1 tablet (10 mg total) by mouth daily. 90 tablet 3  . cholecalciferol (VITAMIN D) 25 MCG (1000 UNIT) tablet Take 1,000  Units by mouth daily.    Marland Kitchen EQUETRO 200 MG CP12 12 hr capsule Take 1 capsule (200 mg total) by mouth at bedtime. 30 capsule 5  . ferrous gluconate (FERGON) 324 MG tablet Take 324 mg by mouth daily with breakfast.    . lamoTRIgine (LAMICTAL) 200 MG tablet TAKE (1) TABLET BY MOUTH TWICE DAILY. 180 tablet 1  . lithium carbonate 150 MG capsule Take 1 capsule (150 mg total) by mouth daily  in the afternoon. 90 capsule 1  . loxapine (LOXITANE) 25 MG capsule TAKE 2 CAPSULES AT BEDTIME X 7 DAYS, THEN 3 AT BEDTIME. (Patient taking differently: Take 25 mg by mouth 2 (two) times daily. ) 75 capsule 0  . pantoprazole (PROTONIX) 40 MG tablet Take 1 tablet (40 mg total) by mouth daily. 90 tablet 2  . dexmethylphenidate (FOCALIN XR) 20 MG 24 hr capsule Take 1 capsule (20 mg total) by mouth daily. 30 capsule 0   No current facility-administered medications for this visit.    Medication Side Effects: Other: tremor and weight gain.  Dyskinesia appears better  SE bettter than they were.  Balance problems intermittently  Allergies:  Allergies  Allergen Reactions  . Azithromycin Anaphylaxis  . Penicillins Anaphylaxis    DID THE REACTION INVOLVE: Swelling of the face/tongue/throat, SOB, or low BP? Yes Sudden or severe rash/hives, skin peeling, or the inside of the mouth or nose? Yes Did it require medical treatment? No When did it last happen? If all above answers are "NO", may proceed with cephalosporin use.  . Adhesive [Tape] Other (See Comments)    On bandaids    Past Medical History:  Diagnosis Date  . ADD (attention deficit disorder)   . Allergy    Seasonal  . Anemia    History of GI blood loss  . Anxiety   . Arthritis   . Bipolar 1 disorder ([place])   . Cancer ([place])   . Colon polyps   . Depression   . Diabetes mellitus ([place])   . Edema, lower extremity   . Epistaxis    Around 2011 or 2012, required cauterization.   . Esophageal stricture   . GERD (gastroesophageal reflux disease)   .  Headache(784.0)   . Hyperlipidemia   . Interstitial cystitis   . Joint pain   . Lactose intolerance   . Lung cancer ([place]) 2002  . Obesity   . Osteoarthritis   . Palpitations   . Sleep apnea    Doesn't use a CPAP  . Swallowing difficulty     Family History  Problem Relation Age of Onset  . Arthritis Mother   . Hearing loss Mother   . Hyperlipidemia Mother   . Hypertension Mother   . Depression Mother   . Anxiety disorder Mother   . Obesity Mother   . Sudden death Mother   . Hypertension Father   . Diabetes Mellitus II Father   . Heart disease Father   . Arthritis Father   . Cancer Father        Brain  . COPD Father   . Diabetes Father   . Hyperlipidemia Father   . Sleep apnea Father   . Early death Sister        Aneroxia/Bulimic  . Depression Brother   . Early death Investment banker, corporate  . Depression Daughter   . Drug abuse Daughter   . Heart disease Daughter   . Hypertension Daughter   . Stroke Maternal Grandmother   . Hypertension Maternal Grandmother   . Arthritis Maternal Grandfather   . Heart attack Maternal Grandfather   . Hearing loss Maternal Grandfather   . Colon cancer Neg Hx   . Esophageal cancer Neg Hx   . Rectal cancer Neg Hx     Social History   Socioeconomic History  . Marital status: Married    Spouse name: Not on file  . Number of children: 1  .  Years of education: Not on file  . Highest education level: Not on file  Occupational History  . Occupation: Admin. assistant  Tobacco Use  . Smoking status: Never Smoker  . Smokeless tobacco: Never Used  Vaping Use  . Vaping Use: Never used  Substance and Sexual Activity  . Alcohol use: Yes    Alcohol/week: 1.0 standard drink    Types: 1 Glasses of wine per week    Comment: Moderate  . Drug use: No  . Sexual activity: Yes  Other Topics Concern  . Not on file  Social History Narrative   Pt lives in [place] with husband Lanny Hurst.  Followed by Dr. Clovis Pu for psychiatry and  Rinaldo Cloud for therapy.   Social Determinants of Health   Financial Resource Strain:   . Difficulty of Paying Living Expenses: Not on file  Food Insecurity:   . Worried About Charity fundraiser in the Last Year: Not on file  . Ran Out of Food in the Last Year: Not on file  Transportation Needs:   . Lack of Transportation (Medical): Not on file  . Lack of Transportation (Non-Medical): Not on file  Physical Activity:   . Days of Exercise per Week: Not on file  . Minutes of Exercise per Session: Not on file  Stress:   . Feeling of Stress : Not on file  Social Connections:   . Frequency of Communication with Friends and Family: Not on file  . Frequency of Social Gatherings with Friends and Family: Not on file  . Attends Religious Services: Not on file  . Active Member of Clubs or Organizations: Not on file  . Attends Archivist Meetings: Not on file  . Marital Status: Not on file  Intimate Partner Violence:   . Fear of Current or Ex-Partner: Not on file  . Emotionally Abused: Not on file  . Physically Abused: Not on file  . Sexually Abused: Not on file    Past Medical History, Surgical history, Social history, and Family history were reviewed and updated as appropriate.   Please see review of systems for further details on the patient's review from today.   Objective:   Physical Exam:  There were no vitals taken for this visit.  Physical Exam Constitutional:      General: She is not in acute distress.    Appearance: She is well-developed.  Musculoskeletal:        General: No deformity.  Neurological:     Mental Status: She is alert and oriented to person, place, and time.     Cranial Nerves: No dysarthria.     Motor: Tremor present.     Coordination: Coordination normal.  Psychiatric:        Attention and Perception: Attention and perception normal. She does not perceive auditory or visual hallucinations.        Mood and Affect: Mood is anxious and  depressed. Affect is not labile, blunt, angry, tearful or inappropriate.        Speech: Speech normal. Speech is not rapid and pressured or slurred.        Behavior: Behavior is not agitated. Behavior is cooperative.        Thought Content: Thought content normal. Thought content is not paranoid or delusional. Thought content does not include homicidal or suicidal ideation. Thought content does not include homicidal or suicidal plan.        Cognition and Memory: Cognition normal. She exhibits impaired recent memory.  Judgment: Judgment normal.     Comments: Insight fair. Sx severity hard to evaluate with change to retirement      Lab Review:     Component Value Date/Time   NA 139 03/02/2020 1433   K 4.5 03/02/2020 1433   CL 103 03/02/2020 1433   CO2 22 03/02/2020 1433   GLUCOSE 83 03/02/2020 1433   GLUCOSE 102 (H) 01/19/2020 1158   BUN 15 03/02/2020 1433   CREATININE 0.76 03/02/2020 1433   CALCIUM 9.4 03/02/2020 1433   PROT 6.4 03/02/2020 1433   ALBUMIN 4.3 03/02/2020 1433   AST 18 03/02/2020 1433   ALT 8 03/02/2020 1433   ALKPHOS 88 03/02/2020 1433   BILITOT 0.2 03/02/2020 1433   GFRNONAA 83 03/02/2020 1433   GFRAA 96 03/02/2020 1433       Component Value Date/Time   WBC 6.0 03/02/2020 1433   WBC 7.4 01/19/2020 1158   RBC 4.39 03/02/2020 1433   RBC 4.19 01/19/2020 1158   HGB 13.7 03/02/2020 1433   HCT 40.4 03/02/2020 1433   PLT 219 03/02/2020 1433   MCV 92 03/02/2020 1433   MCH 31.2 03/02/2020 1433   MCH 30.3 01/19/2020 1158   MCHC 33.9 03/02/2020 1433   MCHC 32.1 01/19/2020 1158   RDW 12.6 03/02/2020 1433   LYMPHSABS 1.6 03/02/2020 1433   MONOABS 0.7 04/04/2019 1004   EOSABS 0.2 03/02/2020 1433   BASOSABS 0.0 03/02/2020 1433  Vitamin D level 33 on 10K units daily on 12/4/`9 Increased to prescription vitamin d 50K units Monday, Wed, Friday.  Rx sent in.   Lithium Lvl  Date Value Ref Range Status  10/21/2018 0.18 (L) 0.60 - 1.20 mmol/L Final     Comment:    Performed at [facility], [address], [place], [place] [address]     No results found for: PHENYTOIN, PHENOBARB, VALPROATE, CBMZ   .res Assessment: Plan:    Bipolar I disorder with rapid cycling ([place]) - Plan: cariprazine (VRAYLAR) capsule, EQUETRO 200 MG CP12 12 hr capsule  Generalized anxiety disorder  Attention deficit hyperactivity disorder (ADHD), predominantly inattentive type - Plan: dexmethylphenidate (FOCALIN XR) 20 MG 24 hr capsule  Mild cognitive impairment  Tremor due to multiple drugs  Greater than 50% of 30 min face to face time with patient was spent on counseling and coordination of care. We discussed the following: Heidie has chronic rapid cycling bipolar disorder which is chronically unstable and has been difficult to control.  The rapid cycling is making it difficult to control frequency of depressive episodes and the anxiety as well.  We have typically had to make frequent med changes.  We have had to reduce mood stabilizer dosages including Vraylar and Equetro she is very sensitive to carbamazepine because higher doses cause dizziness.   Overall rapid cycling pattern continues.   Still unstablecycling from mania to depression.  Increase loxapine to 40 mg HS with goal of weaning Vraylar bc pt doesn't tolerate enough Vraylar to manage mood.  Not change noted so far. Reduction in  Vraylar to 1.5 mg daily with goal of weaning it if possible without noticeable change so far..  Since last year when she was stable she started having hypomanic symptoms again.  We increased Equetro back to 300 mg nightly but she she has had balance problems.  It had stopped the manic symptoms but then she started having balance problems again and we had to switch it back to Equetro 300 and add carbamazepine immediate release 100  nightly.. Less neurological problems with reduction in Vraylar.   It could have been her prior neurologic symptoms were related to the combination of  medications.  But I am hesitant to increase the Vraylar unless absolutely necessary.  ADD managed with Focalin.  Discussed the risk of stimulants including that that could contribute to mood instability and mood swings.  She appears to need it in order to function effectively at work.  She has a high residual anxiety.  It has been impossible to control all of her symptoms simultaneously without causing side effects.  She agrees.  Continue the following Discussed side effects of each medicine. Reduce to see if it helps reduce anxiety.  Focalin XR 20 mg every morning  and stop Focalin 10 mg immediate release daily Equetro 200 mg nightly Carbamazepine immediate release 100 mg nightly Lamotrigine 200 mg twice daily Lithium 150 mg nightly Continue Vraylar 1.5 mg every day (conisder reduction) continue loxapine to 25 mg BID for longer trial.  Discussed potential metabolic side effects associated with atypical antipsychotics, as well as potential risk for movement side effects. Advised pt to contact office if movement side effects occur.  She may be having some mild TD with toe movement.  Option treat tremor.  Conintue amanatadine 100 mg AM prn tremor.  Disc SE meds and this is heightened by the complication of necessary polypharmacy.  FU results of PT for balance.    Supportive therapy in terms of dealing with husband's addiction.  She's interested in Bipolar Support Group and info given.  Requires frequent FU DT chronic instability.  FU 4 weeks.  Lynder Parents MD, DFAPA  Please see After Visit Summary for patient specific instructions. Increase loxapine to 4 capsules 1 to 2 hours before bedtime   Future Appointments  Date Time Provider [place]  06/10/2020  1:00 PM Shanon Ace, LCSW [facility] None  06/15/2020  8:00 AM [name], DO [facility] None  06/18/2020  9:00 AM Shanon Ace, LCSW [facility] None  06/19/2020  9:45 AM Felipa Furnace, DPM [facility]   07/02/2020 10:00 AM Shanon Ace, LCSW [facility] None  07/07/2020  9:30 AM Cottle, [place]., MD [facility] None    No orders of the defined types were placed in this encounter.     -------------------------------

## 2020-06-10 ENCOUNTER — Other Ambulatory Visit (HOSPITAL_COMMUNITY): Payer: Self-pay | Admitting: Family Medicine

## 2020-06-10 ENCOUNTER — Ambulatory Visit (INDEPENDENT_AMBULATORY_CARE_PROVIDER_SITE_OTHER): Payer: BC Managed Care – PPO | Admitting: Psychiatry

## 2020-06-10 DIAGNOSIS — Z1231 Encounter for screening mammogram for malignant neoplasm of breast: Secondary | ICD-10-CM

## 2020-06-10 DIAGNOSIS — F319 Bipolar disorder, unspecified: Secondary | ICD-10-CM | POA: Diagnosis not present

## 2020-06-10 NOTE — Progress Notes (Signed)
[facility] Counselor/Therapist Progress Note  Patient ID: Megan Whitney, MRN: [id number],    Date: 06/10/2020  Time Spent: 60 minutes  1:00pm to 2:00pm  Treatment Type: Individual Therapy  Reported Symptoms: anxiety, depression, irritability, some anger, "I'm not feeling impulsive"   Mental Status Exam:  Appearance:   Casual     Behavior:  Appropriate, Sharing and Motivated  Motor:  Normal  Speech/Language:   Clear and Coherent  Affect:  anxious, depression  Mood:  anxious and depressed  Thought process:  goal directed  Thought content:    WNL  Sensory/Perceptual disturbances:    WNL  Orientation:  oriented to person, place, time/date, situation, day of week, month of year and year  Attention:  Fair  Concentration:  Fair  Memory:  forgetting and worse under stress  Fund of knowledge:   Good  Insight:    Fair  Judgment:   Fair  Impulse Control:  Good   Risk Assessment: Danger to Self:  No Self-injurious Behavior: No Danger to Others: No Duty to Warn:no Physical Aggression / Violence:No  Access to Firearms a concern: No  Gang Involvement:No   Subjective: Patient today reports anxiety and depression. "I've practiced my homework especially staying in the present and setting limits and have done some better with that, but have also had a lot of stressors."  Interventions: Solution-Oriented/Positive Psychology and Ego-Supportive  Diagnosis:   ICD-10-CM   1. Bipolar I disorder with rapid cycling ([place])  F31.9     Plan: Patient not signing tx plan on computer screen due to [place].  Treatment Goals: Goals remain on plan as patient works on strategies to meet her goals. Progress is noted each visit in "Progress" section of goal plan.  Long Term Goal: Reduce overall level, frequency, and intensity of the anxiety so that daily functioning is not impaired.  Short Term Goal: 1.Increase understanding of the beliefs and messages that produce the worry and  anxiety.  Strategies: 1.Help client develop reality-based positive cognitive messages/self-talk. 2. Develop a "coping card" or other reminder which coping strategies are recorded for patient's later use .  PROGRESS: Patient in today reporting anxiety, depression, irritability, anger, and some apprehension.  Her anxiety and apprehension is elevated due to her husband's recent news that he has to go in for biopsy to determine possible prostate cancer.  Patient did a good job of talking through all of her concerns and anxieties about this today.  She also was able to rebound and realize that right now she does not have a lot of information to go on and is trying not to make assumptions and assume the worst, but rather stay in the present for right now.  She is grateful that she has already retired and that way can be free to be with her husband and do what ever is necessary to assist him the next few days.  Is doing well after having retired from her job and reports that they had a very nice celebration for her retirement. Continued work today with some of her anxious thoughts and specifically with her short-term goal and strategy #1 in her treatment plan above.   Worked at interrupting her anxious thoughts, challenging them, and replacing with more positive, reality based, and encouraging thoughts that do not support anxiety.  She is to continue working on this between appointments and remain on her meds as prescribed per her doctor.  Goal review and progress/challenges noted with patient.  Next appt within  2 weeks.   Shanon Ace, LCSW

## 2020-06-11 ENCOUNTER — Telehealth (INDEPENDENT_AMBULATORY_CARE_PROVIDER_SITE_OTHER): Payer: BC Managed Care – PPO | Admitting: Physician Assistant

## 2020-06-11 DIAGNOSIS — M1990 Unspecified osteoarthritis, unspecified site: Secondary | ICD-10-CM | POA: Insufficient documentation

## 2020-06-11 DIAGNOSIS — R300 Dysuria: Secondary | ICD-10-CM

## 2020-06-11 DIAGNOSIS — E785 Hyperlipidemia, unspecified: Secondary | ICD-10-CM | POA: Insufficient documentation

## 2020-06-11 DIAGNOSIS — G4730 Sleep apnea, unspecified: Secondary | ICD-10-CM | POA: Insufficient documentation

## 2020-06-11 MED ORDER — SULFAMETHOXAZOLE-TRIMETHOPRIM 800-160 MG PO TABS: 1.0000 | ORAL_TABLET | Freq: Two times a day (BID) | ORAL | 0 refills | Status: DC

## 2020-06-11 NOTE — Progress Notes (Signed)
Virtual Visit via Video   I connected with patient on 06/11/20 at 11:30 AM EDT by a video enabled telemedicine application and verified that I am speaking with the correct person using two identifiers.  Location patient: Home Location provider: [facility] Office Persons participating in the virtual visit: Patient, Provider, [place] (Patina Moore)  I discussed the limitations of evaluation and management by telemedicine and the availability of in person appointments. The patient expressed understanding and agreed to proceed.  Subjective:   HPI:   Patient presents via [facility] today c/o 1 week of dysuria, urinary urgency and frequency. Occasional hesitancy.Denies fever, chills, nausea or vomiting. Denies lower abdominal pain, flank pain or pelvic pain. Notes some suprapubic pressure. Has remote history of UTI.   ROS:   See pertinent positives and negatives per HPI.  Patient Active Problem List   Diagnosis Date Noted  . Arthritis 06/11/2020  . HLD (hyperlipidemia) 06/11/2020  . Sleep apnea 06/11/2020  . Bipolar disorder, unspecified ([place]) 01/21/2020  . Bipolar disorder current episode depressed ([place]) 01/20/2020  . Suicidal ideation 01/20/2020  . Fracture of superior pubic ramus ([place]) 11/28/2018  . Stricture and stenosis of esophagus 05/16/2012  . Hiatal hernia 05/16/2012  . Dysphagia, unspecified(787.20) 05/15/2012  . Cancer ([place])   . Depression   . Bipolar 1 disorder ([place])   . Anemia   . ANEMIA, IRON DEFICIENCY 09/14/2010  . GERD (gastroesophageal reflux disease) 09/14/2010  . Personal history of colonic polyps 09/14/2010    Social History   Tobacco Use  . Smoking status: Never Smoker  . Smokeless tobacco: Never Used  Substance Use Topics  . Alcohol use: Yes    Alcohol/week: 1.0 standard drink    Types: 1 Glasses of wine per week    Comment: Moderate    Current Outpatient Medications:  .  amantadine (SYMMETREL) 100 MG capsule, Take 1 capsule (100 mg  total) by mouth daily., Disp: 90 capsule, Rfl: 0 .  atorvastatin (LIPITOR) 20 MG tablet, Take 1 tablet (20 mg total) by mouth at bedtime., Disp: 30 tablet, Rfl: 0 .  carbamazepine (TEGRETOL) 100 MG chewable tablet, Chew 1 tablet (100 mg total) by mouth at bedtime., Disp: 90 tablet, Rfl: 0 .  cariprazine (VRAYLAR) capsule, Take 1 capsule (1.5 mg total) by mouth daily., Disp: 30 capsule, Rfl: 1 .  cetirizine (ZYRTEC) 10 MG tablet, Take 1 tablet (10 mg total) by mouth daily., Disp: 90 tablet, Rfl: 3 .  cholecalciferol (VITAMIN D) 25 MCG (1000 UNIT) tablet, Take 1,000 Units by mouth daily., Disp: , Rfl:  .  dexmethylphenidate (FOCALIN XR) 20 MG 24 hr capsule, Take 1 capsule (20 mg total) by mouth daily., Disp: 30 capsule, Rfl: 0 .  EQUETRO 200 MG CP12 12 hr capsule, Take 1 capsule (200 mg total) by mouth at bedtime., Disp: 30 capsule, Rfl: 5 .  ferrous gluconate (FERGON) 324 MG tablet, Take 324 mg by mouth daily with breakfast., Disp: , Rfl:  .  lamoTRIgine (LAMICTAL) 200 MG tablet, TAKE (1) TABLET BY MOUTH TWICE DAILY., Disp: 180 tablet, Rfl: 1 .  lithium carbonate 150 MG capsule, Take 1 capsule (150 mg total) by mouth daily in the afternoon., Disp: 90 capsule, Rfl: 1 .  loxapine (LOXITANE) 25 MG capsule, TAKE 2 CAPSULES AT BEDTIME X 7 DAYS, THEN 3 AT BEDTIME. (Patient taking differently: Take 25 mg by mouth 2 (two) times daily. ), Disp: 75 capsule, Rfl: 0 .  pantoprazole (PROTONIX) 40 MG tablet, Take 1 tablet (40 mg total)  by mouth daily., Disp: 90 tablet, Rfl: 2  Allergies  Allergen Reactions  . Azithromycin Anaphylaxis  . Penicillins Anaphylaxis    DID THE REACTION INVOLVE: Swelling of the face/tongue/throat, SOB, or low BP? Yes Sudden or severe rash/hives, skin peeling, or the inside of the mouth or nose? Yes Did it require medical treatment? No When did it last happen? If all above answers are "NO", may proceed with cephalosporin use.  . Adhesive [Tape] Other (See Comments)    On  bandaids    Objective:   There were no vitals taken for this visit.  Patient is well-developed, well-nourished in no acute distress.  Resting comfortably at home.  Head is normocephalic, atraumatic.  No labored breathing.  Speech is clear and coherent with logical content.  Patient is alert and oriented at baseline.   Assessment and Plan:   1. Dysuria Classic symptoms of cystitis for her.  Will have her increase fluids and avoid bladder irritants.  Rx Bactrim twice daily x5 days.  If not improving/resolving she will need to come in for an office assessment to include UA and culture.  Strict ER precautions reviewed with patient who voiced understanding and agreement with the plan.   Leeanne Rio, PA-C 06/11/2020

## 2020-06-12 ENCOUNTER — Encounter (INDEPENDENT_AMBULATORY_CARE_PROVIDER_SITE_OTHER): Payer: Self-pay | Admitting: Family Medicine

## 2020-06-15 ENCOUNTER — Ambulatory Visit (INDEPENDENT_AMBULATORY_CARE_PROVIDER_SITE_OTHER): Payer: BC Managed Care – PPO | Admitting: Family Medicine

## 2020-06-17 ENCOUNTER — Ambulatory Visit (HOSPITAL_COMMUNITY): Payer: BC Managed Care – PPO

## 2020-06-17 ENCOUNTER — Ambulatory Visit: Payer: BC Managed Care – PPO | Admitting: Psychiatry

## 2020-06-18 ENCOUNTER — Ambulatory Visit (INDEPENDENT_AMBULATORY_CARE_PROVIDER_SITE_OTHER): Payer: BC Managed Care – PPO | Admitting: Psychiatry

## 2020-06-18 ENCOUNTER — Other Ambulatory Visit: Payer: Self-pay

## 2020-06-18 DIAGNOSIS — F319 Bipolar disorder, unspecified: Secondary | ICD-10-CM

## 2020-06-18 NOTE — Progress Notes (Signed)
      [facility] Counselor/Therapist Progress Note  Patient ID: Megan Whitney, MRN: [id number],    Date: 06/18/2020  Time Spent: 60 minutes   9:00am to 10:00am  Treatment Type: Individual Therapy  Reported Symptoms: anxiety, tearful, depressed  Mental Status Exam:  Appearance:   Casual     Behavior:  Appropriate, Sharing and Motivated  Motor:  Normal  Speech/Language:   Clear and Coherent  Affect:  Depressed, Tearful and anxious  Mood:  anxious, depressed and sad  Thought process:  normal  Thought content:    some obsessiveness  Sensory/Perceptual disturbances:    WNL  Orientation:  oriented to person, place, time/date, situation, day of week, month of year and year  Attention:  Fair  Concentration:  Fair  Memory:  [place] of knowledge:   Good  Insight:    Fair  Judgment:   Good  Impulse Control:  Good   Risk Assessment: Danger to Self:  No Self-injurious Behavior: No Danger to Others: No Duty to Warn:no Physical Aggression / Violence:No  Access to Firearms a concern: No  Gang Involvement:No   Subjective: Patient today reports anxiety and depression. Tearful, "but some periods of stability". Husband's report from recent testing shows prostate cancer and has follow up appt next week to get more info.  Interventions: Cognitive Behavioral Therapy, Solution-Oriented/Positive Psychology and Ego-Supportive  Diagnosis:   ICD-10-CM   1. Bipolar I disorder with rapid cycling ([place])  F31.9     Plan: Patient not signing tx plan on computer screen due to [place].  Treatment Goals: Goals remain on plan as patient works on strategies to meet her goals. Progress is noted each visit in "Progress" section of goal plan.  Long Term Goal: Reduce overall level, frequency, and intensity of the anxiety so that daily functioning is not impaired.  Short Term Goal: 1.Increase understanding of the beliefs and messages that produce the worry and  anxiety.  Strategies: 1.Help client develop reality-based positive cognitive messages/self-talk. 2. Develop a "coping card" or other reminder which coping strategies are recorded for patient's later use .  PROGRESS: Patient in for appointment today and reports anxiety, depression, and is more tearful today.  Just found out husband's testing for prostate cancer came back showing"cancerous results" and they have follow up appt with Dr next week.  Very tearful from onset of session and processed her anxious/fearful thoughts and feelings, including "assming the worst" re: her husband's health. Denies any SI. Has some plans for the next few days "to help occupy my mind" until dr appt next week.  Plans include movies, reading her new book getting outside, spending time with husband,and reaching out to friends as needed.  Encouraged good self-care including remaining on her medications as prescribed, getting enough sleep, trying to stay in the present and not assume the worst, being in touch with supportive friends, setting limits and boundaries as needed, continued work on interrupting and replacing her anxious/fearful thoughts, and making herself talk more positive.  Goal review and progress/challenges noted with patient.  Next appointment within 2 weeks.   Shanon Ace, LCSW

## 2020-06-19 ENCOUNTER — Ambulatory Visit: Payer: BC Managed Care – PPO | Admitting: Podiatry

## 2020-06-21 ENCOUNTER — Encounter: Payer: Self-pay | Admitting: Physician Assistant

## 2020-06-21 DIAGNOSIS — E7849 Other hyperlipidemia: Secondary | ICD-10-CM

## 2020-06-22 ENCOUNTER — Ambulatory Visit (HOSPITAL_COMMUNITY)
Admission: RE | Admit: 2020-06-22 | Discharge: 2020-06-22 | Disposition: A | Discharge status: Home / Self Care | Payer: BC Managed Care – PPO | Source: Ambulatory Visit | Attending: Family Medicine | Admitting: Family Medicine

## 2020-06-22 ENCOUNTER — Other Ambulatory Visit: Payer: Self-pay

## 2020-06-22 DIAGNOSIS — Z1231 Encounter for screening mammogram for malignant neoplasm of breast: Secondary | ICD-10-CM | POA: Diagnosis not present

## 2020-06-29 ENCOUNTER — Telehealth: Payer: Self-pay | Admitting: Psychiatry

## 2020-06-29 ENCOUNTER — Other Ambulatory Visit: Payer: Self-pay | Admitting: Psychiatry

## 2020-06-29 NOTE — Telephone Encounter (Signed)
Pt's next appt is 07/07/20 with Dr. Clovis Pu. Needs refill on Focalin 10 mg. Call to [facility] in [place], [place] .

## 2020-06-29 NOTE — Telephone Encounter (Signed)
[facility] sent request, it's pended for Dr. Clovis Pu to send

## 2020-06-29 NOTE — Telephone Encounter (Signed)
Next apt 11/02

## 2020-07-02 ENCOUNTER — Ambulatory Visit (INDEPENDENT_AMBULATORY_CARE_PROVIDER_SITE_OTHER): Payer: BC Managed Care – PPO | Admitting: Psychiatry

## 2020-07-02 ENCOUNTER — Other Ambulatory Visit: Payer: Self-pay

## 2020-07-02 DIAGNOSIS — F319 Bipolar disorder, unspecified: Secondary | ICD-10-CM

## 2020-07-02 NOTE — Progress Notes (Signed)
[facility] Counselor/Therapist Progress Note  Patient ID: Megan Whitney, MRN: [id number],    Date: 07/02/2020  Time Spent: 60 minutes   10:00am to 11:00am  Treatment Type: Individual Therapy  Reported Symptoms: anxiety, depression, tearfulness   Mental Status Exam:  Appearance:   Neat     Behavior:  Appropriate, Sharing and Motivated  Motor:  Normal  Speech/Language:   Clear and Coherent  Affect:  Depressed, Tearful and anxious  Mood:  anxious, depressed and sad  Thought process:  goal directed  Thought content:    WNL  Sensory/Perceptual disturbances:    WNL  Orientation:  oriented to person, place, time/date, situation, day of week, month of year and year  Attention:  Fair  Concentration:  Fair  Memory:  some forgetfulness, worse under stress  Fund of knowledge:   Good  Insight:    Good and Fair  Judgment:   Fair  Impulse Control:  Good and Fair   Risk Assessment: Danger to Self:  No Self-injurious Behavior: No Danger to Others: No Duty to Warn:no Physical Aggression / Violence:No  Access to Firearms a concern: No  Gang Involvement:No   Subjective: Patient today reports depression, anxiety, fearfulness, tearful, sad mostly regarding her husband's medical situation of recent dx of prostate cancer.  Has return appt with his doctor Nov. 8th.   Interventions: Cognitive Behavioral Therapy and Solution-Oriented/Positive Psychology  Diagnosis:   ICD-10-CM   1. Bipolar I disorder with rapid cycling ([place])  F31.9      Plan: Patient not signing tx plan on computer screen due to [place].  Treatment Goals: Goals remain on plan as patient works on strategies to meet her goals. Progress is noted each visit in "Progress" section of goal plan.  Long Term Goal: Reduce overall level, frequency, and intensity of the anxiety so that daily functioning is not impaired.  Short Term Goal: 1.Increase understanding of the beliefs and messages that produce the worry  and anxiety.  Strategies: 1.Help client develop reality-based positive cognitive messages/self-talk. 2. Develop a "coping card" or other reminder which coping strategies are recorded for patient's later use .  PROGRESS: Patient and husband recently learned through his MyChart that his test results show "aggressive prostate cancer and has metastasized to bones and lymph nodes. Patient very tearful initially, depressed, anxious.  Uses session to share and talk through her anxieties and fears. Does seem more calm and grounded by session end. Has supportive friends. And some consolation that the doctor does have a treatment plan for Lanny Hurst. Reading and journaling, talking on phone with friends, staying busy with household chores, "pet therapy" with her dog, and using her faith as a support.  Patient and husband have an appointment on November 8, with his urologist to discuss test results and treatment plan mentioned in the MyChart note.  Patient denies any SI and leaves today more grounded and with some ideas as to how she can take care of herself and help support her husband during this time.  Encouraged patient to let others continue to be supportive of her, to get out some each day, to definitely continue her journaling, working to not make assumptions but rather try staying in the present as much as possible, making self talk positive rather than critical, remaining on her medications as prescribed, and asking for support or help as needed.  Goal review and progress/challenges noted with patient.  Next appointment within 2 weeks.   Shanon Ace, LCSW

## 2020-07-07 ENCOUNTER — Encounter: Payer: Self-pay | Admitting: Psychiatry

## 2020-07-07 ENCOUNTER — Ambulatory Visit (INDEPENDENT_AMBULATORY_CARE_PROVIDER_SITE_OTHER): Payer: BC Managed Care – PPO | Admitting: Psychiatry

## 2020-07-07 ENCOUNTER — Other Ambulatory Visit: Payer: Self-pay

## 2020-07-07 DIAGNOSIS — G251 Drug-induced tremor: Secondary | ICD-10-CM

## 2020-07-07 DIAGNOSIS — F411 Generalized anxiety disorder: Secondary | ICD-10-CM

## 2020-07-07 DIAGNOSIS — F319 Bipolar disorder, unspecified: Secondary | ICD-10-CM | POA: Diagnosis not present

## 2020-07-07 DIAGNOSIS — G3184 Mild cognitive impairment, so stated: Secondary | ICD-10-CM

## 2020-07-07 DIAGNOSIS — R7989 Other specified abnormal findings of blood chemistry: Secondary | ICD-10-CM

## 2020-07-07 DIAGNOSIS — F900 Attention-deficit hyperactivity disorder, predominantly inattentive type: Secondary | ICD-10-CM

## 2020-07-07 MED ORDER — PRAMIPEXOLE DIHYDROCHLORIDE 0.125 MG PO TABS: 0.1250 mg | ORAL_TABLET | Freq: Every morning | ORAL | 1 refills | Status: DC

## 2020-07-07 MED ORDER — DEXMETHYLPHENIDATE HCL ER 20 MG PO CP24: 20.0000 mg | ORAL_CAPSULE | Freq: Every day | ORAL | 0 refills | Status: DC

## 2020-07-07 MED ORDER — DEXMETHYLPHENIDATE HCL 10 MG PO TABS: 10.0000 mg | ORAL_TABLET | Freq: Every day | ORAL | 0 refills | Status: DC

## 2020-07-07 NOTE — Progress Notes (Signed)
Megan Whitney [id number] 01-07-1956 64 y.o.     Subjective:   Patient ID:  Megan Whitney is a 64 y.o. (DOB 1956/07/06) female.   Chief Complaint:  Chief Complaint  Patient presents with  . Follow-up  . Anxiety  . Depression  . Medication Problem  . Stress    H's prostate CA dx recently    Depression        Associated symptoms include decreased concentration.  Associated symptoms include no suicidal ideas.  Past medical history includes anxiety.   Anxiety Symptoms include decreased concentration and nervous/anxious behavior. Patient reports no confusion, dizziness, nausea or suicidal ideas.    Medication Refill Associated symptoms include arthralgias. Pertinent negatives include no nausea or weakness.   Megan Whitney is  follow-up of r chronic mood swings and anxiety and frequent changes in medications.   At visit December 27, 2018.  Focalin XR was increased from 20 mg to 25 mg daily to help with focus and attention and potentially mood.  When seen February 13, 2019.  In an effort to reduce mood cycling we reduce fluoxetine to 20 mg daily.  At visit August 2020.  No meds were changed.  She continued the following: Focalin XR 25 mg every morning and Focalin 10 mg immediate release daily Equetro 200 mg nightly Fluoxetine 20 mg daily Lamotrigine 200 mg twice daily Lithium 150 mg nightly Vraylar 3 mg daily  She called back November 4 after seeing her therapist stating that she was having some hypomanic symptoms with reduced sleep and increased energy.  This potentiality had been discussed and the decision was made to increase Equetro from 200 mg nightly to 300 mg nightly.  seen August 12, 2019.  Because of balance problems she did not tolerate Equetro 300 mg nightly and it was changed to Equetro 200 mg nightly plus immediate release carbamazepine 100 mg nightly.  Her mood had not been stable enough on Equetro 200 mg nightly alone. Less balance problems with change in  CBZ.  seen September 23, 2019.  The following was changed: For bipolar mixed increase CBZ IR to 200 mg HS.  Disc fall and balance risks.For bipolar mixed increase CBZ IR to 200 mg HS.  Disc fall and balance risks.  She called back October 23, 2019 stating she had had another fall and felt it was due to the medication.  Therefore carbamazepine immediate release was reduced from 200 mg nightly to 100 mg nightly.  The Equetro is unchanged.  Last seen November 04, 2019.  The following was noted:  Better at the moment but balance is still somewhat of a problem.  Started PT to help balance.  Had a fall after tripping on a curb and hit her head on sidewalk.  Got a concussion with nausea and HA and dizziness and light sensitivity.  Not over it.  Concentration problems.  Has gotten back to work after a week.   Mood sx pretty good with some mild depression.  Nothing severe.  Trying to minimize stress and self care as much as possible.  No manic sx lately and sleeping fairly well.  No racing thoughts.   Working another year and plans to retire but H alcoholic and not sure it will be good to be there all the time. Seeing therapist q 2 weeks.  Therapy helping . Recent serum vitamin D level was determined to be low at 33.  The goal and chronically depressed patient's is in the 50s if possible.  So her vitamin D was increased on August 08, 2018 or thereabouts.  Checked vitamin D level again and this time it was high at 120 and so it was stopped.  She's restarted per PCP at 1000 units daily.  01/06/2020 appointment the following is noted: Still on: Focalin XR 25 mg every morning and Focalin 10 mg immediate release daily Equetro 200 mg nightly Carbamazepine immediate release 100 mg nightly Fluoxetine 20 mg daily Lamotrigine 200 mg twice daily Lithium 150 mg nightly Vraylar 3 mg daily Not good manic.  Angry.  Missed 2 days bc sx.  Last week vacation which didn't go well.  Crying last week and missed a day.  "Pissed  off at the whole world" but also depressed and hard to get OOB today.  Everything makes me angry.   Blows up without control.  Then regrets it.  Sleep irregular lately. Finished PT which might have helped some but still balance problems. Plan: Cannot increase carbamazepine due to balance issues Temporarily Ativan for agitation 0.5 mg tablets  DT mania stop fluoxetine If fails trial loxapine  01/15/2020 patient called after hours with suicidal thoughts and patient was to go to the [facility]. Patient ultimately admitted to [facility].  Dr. Clovis Pu spoke with clinical pharmacist they are giving history of medication experience and recommendation for loxapine.  Patient hospital stay for 3 days and discharged on loxapine 10 mg nightly as the new medication.  02/10/2020 phone call patient complaining of insomnia.  Loxapine was increased from 10 to 20 mg nightly due to recent insomnia with mania.  02/14/2020 appointment with the following noted: Lately in tears Monday and Tuesday convinced she couldn't do her job.  Better last couple of days.  Motivation is not real good but not depressed like Monday and Tuesday. This week missing some meds bc couldn't get like Focalin.  Been taking other meds. No sig manic sx.  Sleep is better with more loxapine about 8 hours. Anxiety is chronic.  No SE loxapine so far unless a little dizzy here and there. No med changes.  02/25/2020 appointment urgently made after patient was recently hospitalized.  The following is noted: Unstable.  Today manic driving erratically.  Talking a mile a minute.  Not thinking clearly.  Angry.  Slept OK last night.  Hyperactive with poor productivity for a couple of days.  Weekend OK overall.   No falls lately. More tremor lately.  Retiring July 30.  Plan: For tremor amantadine 100 mg twice a day if needed. Increase loxapine to 3 capsules 1 to 2 hours before bedtime Reduce Vraylar to  1.5 mg daily or 3 mg every other day.   04/01/2020 appointment with the following noted: Amantadine hs caused NM. Low grade depression for a couple of weeks.  Not severe.   Extended work date 06/04/20 to retire date.  She feels OK about it in some ways but doesn't feel fully up to it.  Doesn't remember when hypomania resolved from last visit.   Sleep is much better now uninterrupted. Hard to remember lithium at lunch. Still has tremor but better with amantadine.  Anxiety still through the roof. Plan: Increase loxapine 40 mg HS.  05/04/20 appt with the following noted:  Increased loxapine to 40.  Anxiety no better.  All kinds of reasons including worry about retirement and paying for things, but worry is probably exaggerated and H say sit is. Sleep good usually.  No SE noted.  Not making her sleep more with change. Still some manic sx including shortly after last visit and then depressed until the last week.  Irritable and angry. Some panic with SOB and fear of MI. Plan: Continue Vraylar 1.5 mg every day (conisder reduction) Increase loxapine to 50 mg daily for 1 week and if no improvement then increase to 75 mg each night (or 3 of the 25 mg capsules)  Multiple phone calls between appointments with the patient complaining loxapine was causing insomnia.  She has adjusted on timing and dose as she felt it was necessary to make it tolerable because when she takes it in the morning she gets sleepy if she takes very much.  06/09/20 appt Noted: Max tolerated loxapine 25 mg BID.  More than that HS gives strange dreams and difficult to go back to sleep and more in AM too sedated. Not doing well.  Anxiety through the roof.  Did ok with vacation but home worries about everything.   Retired.  Has a lot of time to generally worry.  Started reading again for the first time in awhile.  That's helpful. Takes a while to adjust to retirement.  Anxiety and depression feed each other.  Less interest in some  activities.  Later in afternoon is not quite as anxious. Hard to drive with anxiety.   Plan: Reduce to see if it helps reduce anxiety.  Focalin XR 20 mg every morning  and stop Focalin 10 mg immediate release daily Equetro 200 mg nightly Carbamazepine immediate release 100 mg nightly Lamotrigine 200 mg twice daily Lithium 150 mg nightly Continue Vraylar 1.5 mg every day (conisder reduction) continue loxapine to 25 mg BID for longer trial.  07/07/20 appt with the following noted: Tearful and overwhelmed  By [facility] dx of prostate CA with mets bones and nodes with plans for hormone tx and radiation and chemotherapy.  Found out about 3 weeks ago.   He's in sig pain and she's caregiving.  Hard for him to walk even on walker.  Is falling to pieces but realizes it's typical but bc bipolar may be affecting her harder.  Tearful a lot.  Forgetting things, distracted, personal routine disrupted. She still feels the focalin is helpful.  Poor sleep last night bc H but usually 7-8 hours. No effect noticed from Amantadine for tremor. CO more depressed.  Past Psychiatric Medication Trials: Vraylar 4.5 SE mouth movements reduced to 3 mg 3/20 lithium 150,  Latuda 80, Trileptal 450, olanzapine, Seroquel, risperidone, Abilify Focalin,  Ritalin,  Depakote, Equetro 300 balance issues, Lamictal 200 twice daily, fluoxetine 60,  buspirone, sertraline 100, Wellbutrin history of facial tics, paroxetine cognitive side effects ropinirole, amantadine, Sinemet, Artane, Cogentin, trazodone hangover, Ambien hangover,  Review of Systems:  Review of Systems  HENT: Positive for tinnitus.        Chirping cricket sounds in hears since January  Gastrointestinal: Negative for nausea.  Musculoskeletal: Positive for arthralgias. Negative for gait problem.  Neurological: Positive for tremors. Negative for dizziness, seizures, syncope, weakness and light-headedness.       Still balance problems. No falls lately.   Psychiatric/Behavioral: Positive for decreased concentration, depression and dysphoric mood. Negative for agitation, behavioral problems, confusion, hallucinations, self-injury, sleep disturbance and suicidal ideas. The patient is nervous/anxious. The patient is not hyperactive.   No falls since here. Not currently depressed but unable to remove this from the list.  Medications: I have reviewed the patient's current medications.  Current Outpatient Medications  Medication Sig Dispense Refill  .  atorvastatin (LIPITOR) 20 MG tablet Take 1 tablet (20 mg total) by mouth at bedtime. 30 tablet 0  . carbamazepine (TEGRETOL) 100 MG chewable tablet Chew 1 tablet (100 mg total) by mouth at bedtime. 90 tablet 0  . cariprazine (VRAYLAR) capsule Take 1 capsule (1.5 mg total) by mouth daily. 30 capsule 1  . cetirizine (ZYRTEC) 10 MG tablet Take 1 tablet (10 mg total) by mouth daily. 90 tablet 3  . cholecalciferol (VITAMIN D) 25 MCG (1000 UNIT) tablet Take 1,000 Units by mouth daily.    Marland Kitchen dexmethylphenidate (FOCALIN XR) 20 MG 24 hr capsule Take 1 capsule (20 mg total) by mouth daily. 30 capsule 0  . dexmethylphenidate (FOCALIN) 10 MG tablet Take 1 tablet (10 mg total) by mouth daily in the afternoon. 30 tablet 0  . EQUETRO 200 MG CP12 12 hr capsule Take 1 capsule (200 mg total) by mouth at bedtime. 30 capsule 5  . ferrous gluconate (FERGON) 324 MG tablet Take 324 mg by mouth daily with breakfast.    . lamoTRIgine (LAMICTAL) 200 MG tablet TAKE (1) TABLET BY MOUTH TWICE DAILY. 180 tablet 1  . lithium carbonate 150 MG capsule Take 1 capsule (150 mg total) by mouth daily in the afternoon. 90 capsule 1  . loxapine (LOXITANE) 25 MG capsule TAKE 2 CAPSULES AT BEDTIME X 7 DAYS, THEN 3 AT BEDTIME. (Patient taking differently: Take 25 mg by mouth 2 (two) times daily. ) 75 capsule 0  . pantoprazole (PROTONIX) 40 MG tablet Take 1 tablet (40 mg total) by mouth daily. 90 tablet 2  . sulfamethoxazole-trimethoprim (BACTRIM  DS) 800-160 MG tablet Take 1 tablet by mouth 2 (two) times daily. 10 tablet 0  . [START ON 08/04/2020] dexmethylphenidate (FOCALIN XR) 20 MG 24 hr capsule Take 1 capsule (20 mg total) by mouth daily. 30 capsule 0  . pramipexole (MIRAPEX) 0.125 MG tablet Take 1 tablet (0.125 mg total) by mouth in the morning. 30 tablet 1   No current facility-administered medications for this visit.    Medication Side Effects: Other: tremor and weight gain.  Dyskinesia appears better  SE bettter than they were.  Balance problems intermittently  Allergies:  Allergies  Allergen Reactions  . Azithromycin Anaphylaxis  . Penicillins Anaphylaxis    DID THE REACTION INVOLVE: Swelling of the face/tongue/throat, SOB, or low BP? Yes Sudden or severe rash/hives, skin peeling, or the inside of the mouth or nose? Yes Did it require medical treatment? No When did it last happen? If all above answers are "NO", may proceed with cephalosporin use.  . Adhesive [Tape] Other (See Comments)    On bandaids    Past Medical History:  Diagnosis Date  . ADD (attention deficit disorder)   . Allergy    Seasonal  . Anemia    History of GI blood loss  . Anxiety   . Arthritis   . Bipolar 1 disorder ([place])   . Cancer ([place])   . Colon polyps   . Depression   . Diabetes mellitus ([place])   . Edema, lower extremity   . Epistaxis    Around 2011 or 2012, required cauterization.   . Esophageal stricture   . GERD (gastroesophageal reflux disease)   . Headache(784.0)   . Hyperlipidemia   . Interstitial cystitis   . Joint pain   . Lactose intolerance   . Lung cancer ([place]) 2002  . Obesity   . Osteoarthritis   . Palpitations   . Sleep apnea    Doesn't  use a CPAP  . Swallowing difficulty     Family History  Problem Relation Age of Onset  . Arthritis Mother   . Hearing loss Mother   . Hyperlipidemia Mother   . Hypertension Mother   . Depression Mother   . Anxiety disorder Mother   . Obesity Mother   . Sudden  death Mother   . Hypertension Father   . Diabetes Mellitus II Father   . Heart disease Father   . Arthritis Father   . Cancer Father        Brain  . COPD Father   . Diabetes Father   . Hyperlipidemia Father   . Sleep apnea Father   . Early death Sister        Aneroxia/Bulimic  . Depression Brother   . Early death Investment banker, corporate  . Depression Daughter   . Drug abuse Daughter   . Heart disease Daughter   . Hypertension Daughter   . Stroke Maternal Grandmother   . Hypertension Maternal Grandmother   . Arthritis Maternal Grandfather   . Heart attack Maternal Grandfather   . Hearing loss Maternal Grandfather   . Colon cancer Neg Hx   . Esophageal cancer Neg Hx   . Rectal cancer Neg Hx     Social History   Socioeconomic History  . Marital status: Married    Spouse name: Not on file  . Number of children: 1  . Years of education: Not on file  . Highest education level: Not on file  Occupational History  . Occupation: Admin. assistant  Tobacco Use  . Smoking status: Never Smoker  . Smokeless tobacco: Never Used  Vaping Use  . Vaping Use: Never used  Substance and Sexual Activity  . Alcohol use: Yes    Alcohol/week: 1.0 standard drink    Types: 1 Glasses of wine per week    Comment: Moderate  . Drug use: No  . Sexual activity: Yes  Other Topics Concern  . Not on file  Social History Narrative   Pt lives in [place] with husband Lanny Hurst.  Followed by Dr. Clovis Pu for psychiatry and Rinaldo Cloud for therapy.   Social Determinants of Health   Financial Resource Strain:   . Difficulty of Paying Living Expenses: Not on file  Food Insecurity:   . Worried About Charity fundraiser in the Last Year: Not on file  . Ran Out of Food in the Last Year: Not on file  Transportation Needs:   . Lack of Transportation (Medical): Not on file  . Lack of Transportation (Non-Medical): Not on file  Physical Activity:   . Days of Exercise per Week: Not on file  . Minutes  of Exercise per Session: Not on file  Stress:   . Feeling of Stress : Not on file  Social Connections:   . Frequency of Communication with Friends and Family: Not on file  . Frequency of Social Gatherings with Friends and Family: Not on file  . Attends Religious Services: Not on file  . Active Member of Clubs or Organizations: Not on file  . Attends Archivist Meetings: Not on file  . Marital Status: Not on file  Intimate Partner Violence:   . Fear of Current or Ex-Partner: Not on file  . Emotionally Abused: Not on file  . Physically Abused: Not on file  . Sexually Abused: Not on file    Past Medical History, Surgical history,  Social history, and Family history were reviewed and updated as appropriate.   Please see review of systems for further details on the patient's review from today.   Objective:   Physical Exam:  There were no vitals taken for this visit.  Physical Exam Constitutional:      General: She is not in acute distress.    Appearance: She is well-developed.  Musculoskeletal:        General: No deformity.  Neurological:     Mental Status: She is alert and oriented to person, place, and time.     Cranial Nerves: No dysarthria.     Motor: Tremor present.     Coordination: Coordination normal.  Psychiatric:        Attention and Perception: Attention and perception normal. She does not perceive auditory or visual hallucinations.        Mood and Affect: Mood is anxious and depressed. Affect is tearful. Affect is not labile, blunt, angry or inappropriate.        Speech: Speech normal. Speech is not rapid and pressured or slurred.        Behavior: Behavior is not agitated. Behavior is cooperative.        Thought Content: Thought content normal. Thought content is not paranoid or delusional. Thought content does not include homicidal or suicidal ideation. Thought content does not include homicidal or suicidal plan.        Cognition and Memory: Cognition  normal. She exhibits impaired recent memory.        Judgment: Judgment normal.     Comments: Insight fair. More distressed over H's dx cancer.  More depressed and not caring about things.      Lab Review:     Component Value Date/Time   NA 139 03/02/2020 1433   K 4.5 03/02/2020 1433   CL 103 03/02/2020 1433   CO2 22 03/02/2020 1433   GLUCOSE 83 03/02/2020 1433   GLUCOSE 102 (H) 01/19/2020 1158   BUN 15 03/02/2020 1433   CREATININE 0.76 03/02/2020 1433   CALCIUM 9.4 03/02/2020 1433   PROT 6.4 03/02/2020 1433   ALBUMIN 4.3 03/02/2020 1433   AST 18 03/02/2020 1433   ALT 8 03/02/2020 1433   ALKPHOS 88 03/02/2020 1433   BILITOT 0.2 03/02/2020 1433   GFRNONAA 83 03/02/2020 1433   GFRAA 96 03/02/2020 1433       Component Value Date/Time   WBC 6.0 03/02/2020 1433   WBC 7.4 01/19/2020 1158   RBC 4.39 03/02/2020 1433   RBC 4.19 01/19/2020 1158   HGB 13.7 03/02/2020 1433   HCT 40.4 03/02/2020 1433   PLT 219 03/02/2020 1433   MCV 92 03/02/2020 1433   MCH 31.2 03/02/2020 1433   MCH 30.3 01/19/2020 1158   MCHC 33.9 03/02/2020 1433   MCHC 32.1 01/19/2020 1158   RDW 12.6 03/02/2020 1433   LYMPHSABS 1.6 03/02/2020 1433   MONOABS 0.7 04/04/2019 1004   EOSABS 0.2 03/02/2020 1433   BASOSABS 0.0 03/02/2020 1433  Vitamin D level 33 on 10K units daily on 12/4/`9 Increased to prescription vitamin d 50K units Monday, Wed, Friday.  Rx sent in.   Lithium Lvl  Date Value Ref Range Status  10/21/2018 0.18 (L) 0.60 - 1.20 mmol/L Final    Comment:    Performed at [facility], [address], [place], [place] [address]     No results found for: PHENYTOIN, PHENOBARB, VALPROATE, CBMZ   .res Assessment: Plan:    Bipolar I disorder  with rapid cycling ([place]) - Plan: pramipexole (MIRAPEX) 0.125 MG tablet  Generalized anxiety disorder  Attention deficit hyperactivity disorder (ADHD), predominantly inattentive type - Plan: dexmethylphenidate (FOCALIN XR) 20 MG 24 hr capsule,  dexmethylphenidate (FOCALIN XR) 20 MG 24 hr capsule, dexmethylphenidate (FOCALIN) 10 MG tablet  Mild cognitive impairment  Tremor due to multiple drugs - Plan: pramipexole (MIRAPEX) 0.125 MG tablet  Low vitamin D level  Greater than 50% of 30 min face to face time with patient was spent on counseling and coordination of care. We discussed the following: Elenna has chronic rapid cycling bipolar disorder which is chronically unstable and has been difficult to control.  The rapid cycling is making it difficult to control frequency of depressive episodes and the anxiety as well.  We have typically had to make frequent med changes.  We have had to reduce mood stabilizer dosages including Vraylar and Equetro she is very sensitive to carbamazepine because higher doses cause dizziness.   Overall rapid cycling pattern continues.   Still unstablecycling from mania to depression.  Increase loxapine to 40 mg HS with goal of weaning Vraylar bc pt doesn't tolerate enough Vraylar to manage mood.  Not change noted so far. Reduction in  Vraylar to 1.5 mg daily with goal of weaning it if possible without noticeable change so far.  However concern about worsening depression with added stress..  Since last year when she was stable she started having hypomanic symptoms again.  We increased Equetro back to 300 mg nightly but she she has had balance problems.  It had stopped the manic symptoms but then she started having balance problems again and we had to switch it back to Equetro 300 and add carbamazepine immediate release 100 nightly.. Less neurological problems with reduction in Vraylar.   It could have been her prior neurologic symptoms were related to the combination of medications.  But I am hesitant to increase the Vraylar unless absolutely necessary.  BC stress ADD less managed with Focalin.  Discussed the risk of stimulants including that that could contribute to mood instability and mood swings.  She appears to  need it in order to function effectively at work.  She has a high residual anxiety.  It has been impossible to control all of her symptoms simultaneously without causing side effects.  She agrees.  Continue the following Discussed side effects of each medicine. Continue  Focalin XR 20 mg every morning  She wants to continue 10 mg immediate release daily but try skipping to see if anxiety is better. Equetro 200 mg nightly Carbamazepine immediate release 100 mg nightly Lamotrigine 200 mg twice daily Lithium 150 mg nightly Continue Vraylar 1.5 mg every day (conisder reduction) continue loxapine to 25 mg BID for longer trial.  Discussed potential metabolic side effects associated with atypical antipsychotics, as well as potential risk for movement side effects. Advised pt to contact office if movement side effects occur.  She may be having some mild TD with toe movement.  Option treat tremor.  change amanatadine 100 mg AM to pramipexole to try to help tremor and mood off label.  Disc risk mania.  She wants to do it..  Disc SE meds and this is heightened by the complication of necessary polypharmacy.  FU results of PT for balance.    Supportive therapy in terms of dealing with husband's addiction and now new dx metastatic prostate CA.Marland Kitchen  She's interested in Bipolar Support Group and info given.  Requires frequent FU DT chronic instability.  FU 4 weeks.  Lynder Parents MD, DFAPA  Please see After Visit Summary for patient specific instructions.    Future Appointments  Date Time Provider [place]  07/09/2020 10:00 AM Shanon Ace, LCSW CP-CP None  07/16/2020  1:00 PM Shanon Ace, LCSW CP-CP None  07/21/2020 10:00 AM Shanon Ace, LCSW CP-CP None  08/05/2020 10:00 AM Shanon Ace, LCSW CP-CP None  08/06/2020 10:00 AM Cottle, Billey Co., MD CP-CP None  08/12/2020  8:00 AM Shanon Ace, LCSW CP-CP None  08/19/2020  8:00 AM Shanon Ace, LCSW CP-CP None  08/26/2020  8:00  AM Shanon Ace, LCSW CP-CP None  09/02/2020  8:00 AM Shanon Ace, LCSW CP-CP None  09/07/2020 10:00 AM Cottle, Billey Co., MD CP-CP None    No orders of the defined types were placed in this encounter.     -------------------------------

## 2020-07-09 ENCOUNTER — Ambulatory Visit (INDEPENDENT_AMBULATORY_CARE_PROVIDER_SITE_OTHER): Payer: BC Managed Care – PPO | Admitting: Psychiatry

## 2020-07-09 ENCOUNTER — Other Ambulatory Visit: Payer: Self-pay

## 2020-07-09 DIAGNOSIS — F319 Bipolar disorder, unspecified: Secondary | ICD-10-CM

## 2020-07-09 NOTE — Progress Notes (Signed)
[facility] Counselor/Therapist Progress Note  Patient ID: Megan Whitney, MRN: [id number],    Date: 07/09/2020  Time Spent: 60 minutes   10:00am to 11:00am   Treatment Type: Individual Therapy  Reported Symptoms: anxiety, "extreme" depression, forgetful under stress, anger, overwhelmed  Mental Status Exam:  Appearance:   Neat     Behavior:  Appropriate, Sharing and Motivated  Motor:  Normal  Speech/Language:   Clear and Coherent  Affect:  Depressed and anxious  Mood:  anxious and depressed  Thought process:  goal directed  Thought content:    some obsessiveness  Sensory/Perceptual disturbances:    WNL  Orientation:  oriented to person, place, time/date, situation, day of week, month of year and year  Attention:  Fair  Concentration:  Fair  Memory:  forgetfulness under stress "is worse right now"   Fund of knowledge:   Good  Insight:    Fair  Judgment:   Good  Impulse Control:  Good   Risk Assessment: Danger to Self:  No Self-injurious Behavior: No Danger to Others: No Duty to Warn:no Physical Aggression / Violence:No  Access to Firearms a concern: No  Gang Involvement:No   Subjective:  Patient today reporting extreme depression, but no SI.  Anxious, worried, angry, overwhelmed, and assuming the worst regarding her husband's recently diagnosed" aggressive prostate cancer that has metastasized some already."  Interventions: Solution-Oriented/Positive Psychology and Ego-Supportive  Diagnosis:   ICD-10-CM   1. Bipolar I disorder with rapid cycling ([place])  F31.9      Plan: Patient not signing tx plan on computer screen due to [place].  Treatment Goals: Goals remain on plan as patient works on strategies to meet her goals. Progress is noted each visit in "Progress" section of goal plan.  Long Term Goal: Reduce overall level, frequency, and intensity of the anxiety so that daily functioning is not impaired.  Short Term Goal: 1.Increase understanding  of the beliefs and messages that produce the worry and anxiety.  Strategies: 1.Help client develop reality-based positive cognitive messages/self-talk. 2. Develop a "coping card" or other reminder which coping strategies are recorded for patient's later use .  PROGRESS: Patient in today reporting anxiety, extreme depression, angry, overwhelmed, and fearful mostly related to husband's recently diagnosed aggressive prostate cancer.  Discussed in detail what she and husband have learned so far. Seemed to help her as she processed her thoughts and feelings  Has a difficult time not assuming the worst in situations, and we spent some time working on that in session today.  She continues to be in touch with supportive friends and enjoys reading and journaling, and also using her faith as a support.  Discussed her anxiety about upcoming medical appointment for her husband on November 8 to talk about further test results and treatment plan for her husband.  Actually stated at 1 point today "I think he may be dealing with this a little better than I thought".  She has allowed people to be more supportive and that is helping her.  Encourage patient to continue receiving support from others, trying to stay in the present versus the future, focusing on what she can control versus cannot control, remaining on her medication as prescribed, and trying not to assume the worst in situations.  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  Goal review and progress/challenges discussed with patient.  Next appointment within 1 to 2 weeks.  Shanon Ace, LCSW

## 2020-07-11 DIAGNOSIS — Z23 Encounter for immunization: Secondary | ICD-10-CM | POA: Diagnosis not present

## 2020-07-14 ENCOUNTER — Telehealth: Payer: Self-pay | Admitting: Psychiatry

## 2020-07-14 NOTE — Telephone Encounter (Signed)
Please call and give her my message in response to her question.

## 2020-07-14 NOTE — Telephone Encounter (Signed)
There are no reasonable alternatives to these medications that will work in the same way.  She needs to get a better Medicare D plan that will cover the Vraylar and Equetro or her psychiatric symptoms will get worse if she stops these medications.  There are better Medicare D plans that we will cover these medicines but obviously those plans are more expensive but I can have no control over that.

## 2020-07-14 NOTE — Telephone Encounter (Signed)
Megan Whitney called to report that she will be starting Medicare as of January, 2022.  She will be on regular medicare A&B and prescription plan D.  Her Vraylar and [place] will NOT be covered by medicare.  She needs to know if there are other medications to replace these.  The cost for these medications is over $6000 and she can't afford that price.  She has an appt 12/2, but needs to know asap if there are going to be alternate medications and what they are so she can check on coverage.

## 2020-07-16 ENCOUNTER — Other Ambulatory Visit: Payer: Self-pay

## 2020-07-16 ENCOUNTER — Ambulatory Visit (INDEPENDENT_AMBULATORY_CARE_PROVIDER_SITE_OTHER): Payer: BC Managed Care – PPO | Admitting: Psychiatry

## 2020-07-16 DIAGNOSIS — F319 Bipolar disorder, unspecified: Secondary | ICD-10-CM | POA: Diagnosis not present

## 2020-07-16 NOTE — Telephone Encounter (Signed)
Megan Whitney was in the office today and I gave her the message that there weren't other medication to replace the [place] and Equetro and that it was advised she not stop those medications.  I advised her to work with her insurance agent to find and plan that would better cover the medications.

## 2020-07-16 NOTE — Progress Notes (Signed)
      [facility] Counselor/Therapist Progress Note  Patient ID: Megan Whitney, MRN: [id number],    Date: 07/16/2020  Time Spent: 60 minutes   1:00pm to 2:00pm  Treatment Type: Individual Therapy  Reported Symptoms: anxiety, depression, fearfulness, crying  Mental Status Exam:  Appearance:   Casual     Behavior:  Appropriate and Sharing  Motor:  Normal  Speech/Language:   Clear and Coherent  Affect:  anxious, depressed  Mood:  anxious and depressed  Thought process:  goal directed  Thought content:    WNL  Sensory/Perceptual disturbances:    WNL  Orientation:  oriented to person, place, time/date, situation, day of week, month of year and year  Attention:  Fair  Concentration:  Fair  Memory:  some forgetfulness  Fund of knowledge:   Good  Insight:    Fair  Judgment:   Good  Impulse Control:  Good   Risk Assessment: Danger to Self:  No Self-injurious Behavior: No Danger to Others: No Duty to Warn:no Physical Aggression / Violence:No  Access to Firearms a concern: No  Gang Involvement:No   Subjective:  Patient today reports being fearful, depressed, overwhelmed, anxious "but trying to manage better with tools we work on in therapy and I can do outside on my own." States "it's harder when I'm on my own."  Interventions: Cognitive Behavioral Therapy, Solution-Oriented/Positive Psychology and Ego-Supportive  Diagnosis:   ICD-10-CM   1. Bipolar I disorder with rapid cycling ([place])  F31.9     Plan: Patient not signing tx plan on computer screen due to [place].  Treatment Goals: Goals remain on plan as patient works on strategies to meet her goals. Progress is noted each visit in "Progress" section of goal plan.  Long Term Goal: Reduce overall level, frequency, and intensity of the anxiety so that daily functioning is not impaired.  Short Term Goal: 1.Increase understanding of the beliefs and messages that produce the worry and  anxiety.  Strategies: 1.Help client develop reality-based positive cognitive messages/self-talk. 2. Develop a "coping card" or other reminder which coping strategies are recorded for patient's later use .  PROGRESS: Patient in today and reporting anxiety, depression, fearfulness, and crying. Needed session today to process more of her sadness, fears, and anxieties.  Denies any SI.  Husband diagnosed with prostate cancer that has metastasized to lymph nodes and bones. He recently began the monthly hormone treatments (will have 3). To meet with oncologist at [facility] today. States she has a hard time not assuming the worst, and continues to work on this today as we talk and also on her own between session. Church is supportive and already offering to help in multiple ways during this time for patient and husband. Is in touch with other supportive friends. Continuing to journal. Encouraged patient to encourage patient to remain in contact with supportive people, stay on her medication as prescribed, trying to stay in the present and not jump into the future assuming the worst, continue her part-time work as she chooses since she feels that is helping her also, and making positive and affirming.  Goal review and progress/challenges noted with patient.  Next appointment within 1 to 2 weeks.   Megan Ace, LCSW

## 2020-07-16 NOTE — Telephone Encounter (Signed)
Noted thank you

## 2020-07-20 ENCOUNTER — Encounter (INDEPENDENT_AMBULATORY_CARE_PROVIDER_SITE_OTHER): Payer: Self-pay | Admitting: Family Medicine

## 2020-07-21 ENCOUNTER — Ambulatory Visit: Payer: BC Managed Care – PPO | Admitting: Psychiatry

## 2020-07-21 ENCOUNTER — Other Ambulatory Visit: Payer: Self-pay | Admitting: Emergency Medicine

## 2020-07-21 DIAGNOSIS — E7849 Other hyperlipidemia: Secondary | ICD-10-CM

## 2020-07-21 MED ORDER — ATORVASTATIN CALCIUM 20 MG PO TABS: 20.0000 mg | ORAL_TABLET | Freq: Every day | ORAL | 0 refills | Status: DC

## 2020-07-22 ENCOUNTER — Ambulatory Visit (INDEPENDENT_AMBULATORY_CARE_PROVIDER_SITE_OTHER): Payer: BC Managed Care – PPO

## 2020-07-22 ENCOUNTER — Ambulatory Visit (INDEPENDENT_AMBULATORY_CARE_PROVIDER_SITE_OTHER): Payer: BC Managed Care – PPO | Admitting: Psychiatry

## 2020-07-22 ENCOUNTER — Other Ambulatory Visit: Payer: Self-pay

## 2020-07-22 DIAGNOSIS — E7849 Other hyperlipidemia: Secondary | ICD-10-CM | POA: Diagnosis not present

## 2020-07-22 DIAGNOSIS — F319 Bipolar disorder, unspecified: Secondary | ICD-10-CM

## 2020-07-22 LAB — HEPATIC FUNCTION PANEL
ALT: 10 U/L (ref 0–35)
AST: 15 U/L (ref 0–37)
Albumin: 4.3 g/dL (ref 3.5–5.2)
Alkaline Phosphatase: 67 U/L (ref 39–117)
Bilirubin, Direct: 0.1 mg/dL (ref 0.0–0.3)
Total Bilirubin: 0.4 mg/dL (ref 0.2–1.2)
Total Protein: 6.8 g/dL (ref 6.0–8.3)

## 2020-07-22 LAB — LIPID PANEL
Cholesterol: 200 mg/dL (ref 0–200)
HDL: 61.1 mg/dL (ref >39.00)
LDL Cholesterol: 112 mg/dL — ABNORMAL HIGH (ref 0–99)
NonHDL: 138.43
Total CHOL/HDL Ratio: 3
Triglycerides: 131 mg/dL (ref 0.0–149.0)
VLDL: 26.2 mg/dL (ref 0.0–40.0)

## 2020-07-22 NOTE — Progress Notes (Signed)
      [facility] Counselor/Therapist Progress Note  Patient ID: Megan Whitney, MRN: [id number],    Date: 07/22/2020  Time Spent: 60 minutes 12:00pm to 1:00pm  Treatment Type: Individual Therapy  Reported Symptoms: anxiety, fearful, depressed, tearful, "inner chaos" and overwhelmed  Mental Status Exam:  Appearance:   Casual     Behavior:  Appropriate, Sharing and Motivated  Motor:  Normal  Speech/Language:   Clear and Coherent  Affect:  anxious, depressed  Mood:  anxious and depressed  Thought process:  goal directed  Thought content:    some obsessiveness  Sensory/Perceptual disturbances:    WNL  Orientation:  oriented to person, place, time/date, situation, day of week, month of year and year  Attention:  Fair  Concentration:  Fair  Memory:  [place] of knowledge:   Good  Insight:    Fair  Judgment:   Fair  Impulse Control:  Fair and Poor   Risk Assessment: Danger to Self:  No Self-injurious Behavior: No Danger to Others: No Duty to Warn:no Physical Aggression / Violence:No  Access to Firearms a concern: No  Gang Involvement:No   Subjective: Patient reports anxiety and depression today, has been reaching out more to friends and family more.  Doing some better with self care in getting outside more, walking, and recently went and got a pedicure.     Interventions: Cognitive Behavioral Therapy and Solution-Oriented/Positive Psychology  Diagnosis:   ICD-10-CM   1. Bipolar I disorder with rapid cycling ([place])  F31.9     Plan: Patient not signing tx plan on computer screen due to [place].  Treatment Goals: Goals remain on plan as patient works on strategies to meet her goals. Progress is noted each visit in "Progress" section of goal plan.  Long Term Goal: Reduce overall level, frequency, and intensity of the anxiety so that daily functioning is not impaired.  Short Term Goal: 1.Increase understanding of the beliefs and messages that produce the worry  and anxiety.  Strategies: 1.Help client develop reality-based positive cognitive messages/self-talk. 2. Develop a "coping card" or other reminder which coping strategies are recorded for patient's later use .  PROGRESS: Patient today reporting anxiety and depression with depression being the stronger mostly due to husband's prostate cancer and stress being escalated. Talked through several situations of conflict between her and husband which took a good part of her session today. Concerned that he is still drinking heavily on top of his medication. Discussed how she feels helpless "to make him stop drinking" and how she feels" husband has given up." Depression affecting her ability to "take care of myself". Looked at some ways she can better help herself including healthy choices, remain on her meds as prescribed, try to get good sleep, staying in contact with supportive friends, stop self-judgement, noticing times of progress and strength for herself, focusing on positives in addition to addressing the challenges, use positive/encouraging self-talk, and continue letting her faith be a helpful resource for her.   Goal review and progress/challenges noted with patient.  Next appt within 2 weeks.   Shanon Ace, LCSW

## 2020-07-29 ENCOUNTER — Other Ambulatory Visit: Payer: Self-pay | Admitting: Psychiatry

## 2020-07-29 DIAGNOSIS — F319 Bipolar disorder, unspecified: Secondary | ICD-10-CM

## 2020-08-01 ENCOUNTER — Other Ambulatory Visit: Payer: Self-pay | Admitting: Psychiatry

## 2020-08-01 DIAGNOSIS — F319 Bipolar disorder, unspecified: Secondary | ICD-10-CM

## 2020-08-01 DIAGNOSIS — G251 Drug-induced tremor: Secondary | ICD-10-CM

## 2020-08-05 ENCOUNTER — Ambulatory Visit: Payer: BC Managed Care – PPO | Admitting: Psychiatry

## 2020-08-06 ENCOUNTER — Encounter: Payer: Self-pay | Admitting: Psychiatry

## 2020-08-06 ENCOUNTER — Other Ambulatory Visit: Payer: Self-pay

## 2020-08-06 ENCOUNTER — Ambulatory Visit (INDEPENDENT_AMBULATORY_CARE_PROVIDER_SITE_OTHER): Payer: BC Managed Care – PPO | Admitting: Psychiatry

## 2020-08-06 DIAGNOSIS — G251 Drug-induced tremor: Secondary | ICD-10-CM

## 2020-08-06 DIAGNOSIS — R7989 Other specified abnormal findings of blood chemistry: Secondary | ICD-10-CM

## 2020-08-06 DIAGNOSIS — F411 Generalized anxiety disorder: Secondary | ICD-10-CM | POA: Diagnosis not present

## 2020-08-06 DIAGNOSIS — F319 Bipolar disorder, unspecified: Secondary | ICD-10-CM

## 2020-08-06 DIAGNOSIS — F900 Attention-deficit hyperactivity disorder, predominantly inattentive type: Secondary | ICD-10-CM | POA: Diagnosis not present

## 2020-08-06 DIAGNOSIS — G3184 Mild cognitive impairment, so stated: Secondary | ICD-10-CM

## 2020-08-06 MED ORDER — DEXMETHYLPHENIDATE HCL ER 20 MG PO CP24: 20.0000 mg | ORAL_CAPSULE | Freq: Every day | ORAL | 0 refills | Status: DC

## 2020-08-06 MED ORDER — CARIPRAZINE HCL 1.5 MG PO CAPS: 1.5000 mg | ORAL_CAPSULE | Freq: Every day | ORAL | 1 refills | Status: DC

## 2020-08-06 MED ORDER — PRAMIPEXOLE DIHYDROCHLORIDE 0.25 MG PO TABS: 0.2500 mg | ORAL_TABLET | Freq: Two times a day (BID) | ORAL | 1 refills | Status: DC

## 2020-08-06 NOTE — Progress Notes (Signed)
Megan Whitney [id number] May 21, 1956 64 y.o.     Subjective:   Patient ID:  MARIHA SLEEPER is a 64 y.o. (DOB 1956-02-05) female.   Chief Complaint:  Chief Complaint  Patient presents with  . Follow-up  . Depression  . Anxiety  . Medication Problem  . Tremors    Depression        Associated symptoms include decreased concentration.  Associated symptoms include no suicidal ideas.  Past medical history includes anxiety.   Anxiety Symptoms include decreased concentration and nervous/anxious behavior. Patient reports no confusion, dizziness, nausea, palpitations or suicidal ideas.    Medication Refill Associated symptoms include arthralgias. Pertinent negatives include no nausea or weakness.   lSusan SUMAIYAH MARKERT is  follow-up of r chronic mood swings and anxiety and frequent changes in medications.   At visit December 27, 2018.  Focalin XR was increased from 20 mg to 25 mg daily to help with focus and attention and potentially mood.  When seen February 13, 2019.  In an effort to reduce mood cycling we reduce fluoxetine to 20 mg daily.  At visit August 2020.  No meds were changed.  She continued the following: Focalin XR 25 mg every morning and Focalin 10 mg immediate release daily Equetro 200 mg nightly Fluoxetine 20 mg daily Lamotrigine 200 mg twice daily Lithium 150 mg nightly Vraylar 3 mg daily  She called back November 4 after seeing her therapist stating that she was having some hypomanic symptoms with reduced sleep and increased energy.  This potentiality had been discussed and the decision was made to increase Equetro from 200 mg nightly to 300 mg nightly.  seen August 12, 2019.  Because of balance problems she did not tolerate Equetro 300 mg nightly and it was changed to Equetro 200 mg nightly plus immediate release carbamazepine 100 mg nightly.  Her mood had not been stable enough on Equetro 200 mg nightly alone. Less balance problems with change in CBZ.  seen September 23, 2019.  The following was changed: For bipolar mixed increase CBZ IR to 200 mg HS.  Disc fall and balance risks.For bipolar mixed increase CBZ IR to 200 mg HS.  Disc fall and balance risks.  She called back October 23, 2019 stating she had had another fall and felt it was due to the medication.  Therefore carbamazepine immediate release was reduced from 200 mg nightly to 100 mg nightly.  The Equetro is unchanged.  Last seen November 04, 2019.  The following was noted:  Better at the moment but balance is still somewhat of a problem.  Started PT to help balance.  Had a fall after tripping on a curb and hit her head on sidewalk.  Got a concussion with nausea and HA and dizziness and light sensitivity.  Not over it.  Concentration problems.  Has gotten back to work after a week.   Mood sx pretty good with some mild depression.  Nothing severe.  Trying to minimize stress and self care as much as possible.  No manic sx lately and sleeping fairly well.  No racing thoughts.   Working another year and plans to retire but H alcoholic and not sure it will be good to be there all the time. Seeing therapist q 2 weeks.  Therapy helping . Recent serum vitamin D level was determined to be low at 33.  The goal and chronically depressed patient's is in the 50s if possible.  So her vitamin D was increased on  August 08, 2018 or thereabouts.  Checked vitamin D level again and this time it was high at 120 and so it was stopped.  She's restarted per PCP at 1000 units daily.  01/06/2020 appointment the following is noted: Still on: Focalin XR 25 mg every morning and Focalin 10 mg immediate release daily Equetro 200 mg nightly Carbamazepine immediate release 100 mg nightly Fluoxetine 20 mg daily Lamotrigine 200 mg twice daily Lithium 150 mg nightly Vraylar 3 mg daily Not good manic.  Angry.  Missed 2 days bc sx.  Last week vacation which didn't go well.  Crying last week and missed a day.  "Pissed off at the whole  world" but also depressed and hard to get OOB today.  Everything makes me angry.   Blows up without control.  Then regrets it.  Sleep irregular lately. Finished PT which might have helped some but still balance problems. Plan: Cannot increase carbamazepine due to balance issues Temporarily Ativan for agitation 0.5 mg tablets  DT mania stop fluoxetine If fails trial loxapine  01/15/2020 patient called after hours with suicidal thoughts and patient was to go to the [facility]. Patient ultimately admitted to [facility] psychiatric unit.  Dr. Clovis Pu spoke with clinical pharmacist they are giving history of medication experience and recommendation for loxapine.  Patient hospital stay for 3 days and discharged on loxapine 10 mg nightly as the new medication.  02/10/2020 phone call patient complaining of insomnia.  Loxapine was increased from 10 to 20 mg nightly due to recent insomnia with mania.  02/14/2020 appointment with the following noted: Lately in tears Monday and Tuesday convinced she couldn't do her job.  Better last couple of days.  Motivation is not real good but not depressed like Monday and Tuesday. This week missing some meds bc couldn't get like Focalin.  Been taking other meds. No sig manic sx.  Sleep is better with more loxapine about 8 hours. Anxiety is chronic.  No SE loxapine so far unless a little dizzy here and there. No med changes.  02/25/2020 appointment urgently made after patient was recently hospitalized.  The following is noted: Unstable.  Today manic driving erratically.  Talking a mile a minute.  Not thinking clearly.  Angry.  Slept OK last night.  Hyperactive with poor productivity for a couple of days.  Weekend OK overall.   No falls lately. More tremor lately.  Retiring July 30.  Plan: For tremor amantadine 100 mg twice a day if needed. Increase loxapine to 3 capsules 1 to 2 hours before bedtime Reduce Vraylar to 1.5 mg daily or 3  mg every other day.   04/01/2020 appointment with the following noted: Amantadine hs caused NM. Low grade depression for a couple of weeks.  Not severe.   Extended work date 06/04/20 to retire date.  She feels OK about it in some ways but doesn't feel fully up to it.  Doesn't remember when hypomania resolved from last visit.   Sleep is much better now uninterrupted. Hard to remember lithium at lunch. Still has tremor but better with amantadine.  Anxiety still through the roof. Plan: Increase loxapine 40 mg HS.  05/04/20 appt with the following noted:  Increased loxapine to 40.  Anxiety no better.  All kinds of reasons including worry about retirement and paying for things, but worry is probably exaggerated and H say sit is. Sleep good usually.  No SE noted.  Not making her sleep more with change.  Still some manic sx including shortly after last visit and then depressed until the last week.  Irritable and angry. Some panic with SOB and fear of MI. Plan: Continue Vraylar 1.5 mg every day (conisder reduction) Increase loxapine to 50 mg daily for 1 week and if no improvement then increase to 75 mg each night (or 3 of the 25 mg capsules)  Multiple phone calls between appointments with the patient complaining loxapine was causing insomnia.  She has adjusted on timing and dose as she felt it was necessary to make it tolerable because when she takes it in the morning she gets sleepy if she takes very much.  06/09/20 appt Noted: Max tolerated loxapine 25 mg BID.  More than that HS gives strange dreams and difficult to go back to sleep and more in AM too sedated. Not doing well.  Anxiety through the roof.  Did ok with vacation but home worries about everything.   Retired.  Has a lot of time to generally worry.  Started reading again for the first time in awhile.  That's helpful. Takes a while to adjust to retirement.  Anxiety and depression feed each other.  Less interest in some activities.  Later in  afternoon is not quite as anxious. Hard to drive with anxiety.   Plan: Reduce to see if it helps reduce anxiety.  Focalin XR 20 mg every morning  and stop Focalin 10 mg immediate release daily Equetro 200 mg nightly Carbamazepine immediate release 100 mg nightly Lamotrigine 200 mg twice daily Lithium 150 mg nightly Continue Vraylar 1.5 mg every day (conisder reduction) continue loxapine to 25 mg BID for longer trial.  07/07/20 appt with the following noted: Tearful and overwhelmed  By [place] dx of prostate CA with mets bones and nodes with plans for hormone tx and radiation and chemotherapy.  Found out about 3 weeks ago.   He's in sig pain and she's caregiving.  Hard for him to walk even on walker.  Is falling to pieces but realizes it's typical but bc bipolar may be affecting her harder.  Tearful a lot.  Forgetting things, distracted, personal routine disrupted. She still feels the focalin is helpful.  Poor sleep last night bc H but usually 7-8 hours. No effect noticed from Amantadine for tremor. CO more depressed. Plan: Option treat tremor.  change amanatadine 100 mg AM to pramipexole to try to help tremor and mood off label.  Disc risk mania.  She wants to do it..  07/14/2020 phone call:Sue called to report that she will be starting Medicare as of January, 2022.  She will be on regular medicare A&B and prescription plan D.  Her Vraylar and Moss Mc will NOT be covered by medicare.  She needs to know if there are other medications to replace these.  The cost for these medications is over $6000 and she can't afford that price.  She has an appt 12/2, but needs to know asap if there are going to be alternate medications and what they are so she can check on coverage. MD response: There are no reasonable alternatives to these medications that will work in the same way.  She needs to get a better Medicare D plan that will cover the Vraylar and Equetro or her psychiatric symptoms will get worse if she  stops these medications.  There are better Medicare D plans that we will cover these medicines but obviously those plans are more expensive but I can have no control over that.  08/06/2020 appointment with the following noted: Tremor no better and maybe worse with switch from to pramipexole 0.125 mg BID from Amantadine.  No SE. Depressed and anxious and crying a lot.  Hard to tell if related to H.  Anxiety definitely related to H.  H can't do very much bc pain and on pain meds and anemic.  Transfusion yesterday.  H can't drive or shop.  Too weak.  Says she can't find a medicare plan that will cover Equetro and [employer].   Past Psychiatric Medication Trials: Vraylar 4.5 SE mouth movements reduced to 3 mg 3/20 lithium 150,  Latuda 80, Trileptal 450, olanzapine, Seroquel, risperidone, Abilify Focalin,  Ritalin,  Depakote, Equetro 300 balance issues, Lamictal 200 twice daily, fluoxetine 60,  buspirone, sertraline 100, Wellbutrin history of facial tics, paroxetine cognitive side effects ropinirole, amantadine, Sinemet, Artane, Cogentin, pramipexole 0.125 trazodone hangover, Ambien hangover,  Review of Systems:  Review of Systems  HENT: Positive for tinnitus.        Chirping cricket sounds in hears since January  Cardiovascular: Negative for palpitations.  Gastrointestinal: Negative for nausea.  Musculoskeletal: Positive for arthralgias. Negative for gait problem.  Neurological: Positive for tremors. Negative for dizziness, seizures, syncope, weakness and light-headedness.       Still balance problems. No falls lately.  Psychiatric/Behavioral: Positive for decreased concentration, depression and dysphoric mood. Negative for agitation, behavioral problems, confusion, hallucinations, self-injury, sleep disturbance and suicidal ideas. The patient is nervous/anxious. The patient is not hyperactive.   No falls since here. Not currently depressed but unable to remove this from the list.  Medications:  I have reviewed the patient's current medications.  Current Outpatient Medications  Medication Sig Dispense Refill  . atorvastatin (LIPITOR) 20 MG tablet Take 1 tablet (20 mg total) by mouth at bedtime. 90 tablet 0  . carbamazepine (TEGRETOL) 100 MG chewable tablet CHEW 1 TABLET BY MOUTH AT BEDTIME. 90 tablet 0  . cetirizine (ZYRTEC) 10 MG tablet Take 1 tablet (10 mg total) by mouth daily. 90 tablet 3  . cholecalciferol (VITAMIN D) 25 MCG (1000 UNIT) tablet Take 1,000 Units by mouth daily.    Marland Kitchen dexmethylphenidate (FOCALIN XR) 20 MG 24 hr capsule Take 1 capsule (20 mg total) by mouth daily. 30 capsule 0  . [START ON 09/03/2020] dexmethylphenidate (FOCALIN XR) 20 MG 24 hr capsule Take 1 capsule (20 mg total) by mouth daily. 30 capsule 0  . EQUETRO 200 MG CP12 12 hr capsule Take 1 capsule (200 mg total) by mouth at bedtime. 30 capsule 5  . ferrous gluconate (FERGON) 324 MG tablet Take 324 mg by mouth daily with breakfast.    . lamoTRIgine (LAMICTAL) 200 MG tablet TAKE (1) TABLET BY MOUTH TWICE DAILY. 180 tablet 1  . lithium carbonate 150 MG capsule Take 1 capsule (150 mg total) by mouth daily in the afternoon. 90 capsule 1  . loxapine (LOXITANE) 25 MG capsule TAKE 2 CAPSULES AT BEDTIME X 7 DAYS, THEN 3 AT BEDTIME. (Patient taking differently: Take 25 mg by mouth 2 (two) times daily. ) 75 capsule 0  . pantoprazole (PROTONIX) 40 MG tablet Take 1 tablet (40 mg total) by mouth daily. 90 tablet 2  . pramipexole (MIRAPEX) 0.25 MG tablet Take 1 tablet (0.25 mg total) by mouth in the morning and at bedtime. 60 tablet 1  . cariprazine (VRAYLAR) capsule Take 1 capsule (1.5 mg total) by mouth daily. 30 capsule 1  . sulfamethoxazole-trimethoprim (BACTRIM DS) 800-160 MG tablet Take 1 tablet by mouth  2 (two) times daily. 10 tablet 0   No current facility-administered medications for this visit.    Medication Side Effects: Other: tremor and weight gain.  Dyskinesia appears better  SE bettter than they were.   Balance problems intermittently  Allergies:  Allergies  Allergen Reactions  . Azithromycin Anaphylaxis  . Penicillins Anaphylaxis    DID THE REACTION INVOLVE: Swelling of the face/tongue/throat, SOB, or low BP? Yes Sudden or severe rash/hives, skin peeling, or the inside of the mouth or nose? Yes Did it require medical treatment? No When did it last happen? If all above answers are "NO", may proceed with cephalosporin use.  . Adhesive [Tape] Other (See Comments)    On bandaids    Past Medical History:  Diagnosis Date  . ADD (attention deficit disorder)   . Allergy    Seasonal  . Anemia    History of GI blood loss  . Anxiety   . Arthritis   . Bipolar 1 disorder ([place])   . Cancer ([place])   . Colon polyps   . Depression   . Diabetes mellitus ([place])   . Edema, lower extremity   . Epistaxis    Around 2011 or 2012, required cauterization.   . Esophageal stricture   . GERD (gastroesophageal reflux disease)   . Headache(784.0)   . Hyperlipidemia   . Interstitial cystitis   . Joint pain   . Lactose intolerance   . Lung cancer ([place]) 2002  . Obesity   . Osteoarthritis   . Palpitations   . Sleep apnea    Doesn't use a CPAP  . Swallowing difficulty     Family History  Problem Relation Age of Onset  . Arthritis Mother   . Hearing loss Mother   . Hyperlipidemia Mother   . Hypertension Mother   . Depression Mother   . Anxiety disorder Mother   . Obesity Mother   . Sudden death Mother   . Hypertension Father   . Diabetes Mellitus II Father   . Heart disease Father   . Arthritis Father   . Cancer Father        Brain  . COPD Father   . Diabetes Father   . Hyperlipidemia Father   . Sleep apnea Father   . Early death Sister        Aneroxia/Bulimic  . Depression Brother   . Early death Investment banker, corporate  . Depression Daughter   . Drug abuse Daughter   . Heart disease Daughter   . Hypertension Daughter   . Stroke Maternal Grandmother   .  Hypertension Maternal Grandmother   . Arthritis Maternal Grandfather   . Heart attack Maternal Grandfather   . Hearing loss Maternal Grandfather   . Colon cancer Neg Hx   . Esophageal cancer Neg Hx   . Rectal cancer Neg Hx     Social History   Socioeconomic History  . Marital status: Married    Spouse name: Not on file  . Number of children: 1  . Years of education: Not on file  . Highest education level: Not on file  Occupational History  . Occupation: Admin. assistant  Tobacco Use  . Smoking status: Never Smoker  . Smokeless tobacco: Never Used  Vaping Use  . Vaping Use: Never used  Substance and Sexual Activity  . Alcohol use: Yes    Alcohol/week: 1.0 standard drink    Types: 1 Glasses of wine per week  Comment: Moderate  . Drug use: No  . Sexual activity: Yes  Other Topics Concern  . Not on file  Social History Narrative   Pt lives in [place] with husband Lanny Hurst.  Followed by Dr. Clovis Pu for psychiatry and Rinaldo Cloud for therapy.   Social Determinants of Health   Financial Resource Strain:   . Difficulty of Paying Living Expenses: Not on file  Food Insecurity:   . Worried About Charity fundraiser in the Last Year: Not on file  . Ran Out of Food in the Last Year: Not on file  Transportation Needs:   . Lack of Transportation (Medical): Not on file  . Lack of Transportation (Non-Medical): Not on file  Physical Activity:   . Days of Exercise per Week: Not on file  . Minutes of Exercise per Session: Not on file  Stress:   . Feeling of Stress : Not on file  Social Connections:   . Frequency of Communication with Friends and Family: Not on file  . Frequency of Social Gatherings with Friends and Family: Not on file  . Attends Religious Services: Not on file  . Active Member of Clubs or Organizations: Not on file  . Attends Archivist Meetings: Not on file  . Marital Status: Not on file  Intimate Partner Violence:   . Fear of Current or Ex-Partner:  Not on file  . Emotionally Abused: Not on file  . Physically Abused: Not on file  . Sexually Abused: Not on file    Past Medical History, Surgical history, Social history, and Family history were reviewed and updated as appropriate.   Please see review of systems for further details on the patient's review from today.   Objective:   Physical Exam:  There were no vitals taken for this visit.  Physical Exam Constitutional:      General: She is not in acute distress.    Appearance: She is well-developed.  Musculoskeletal:        General: No deformity.  Neurological:     Mental Status: She is alert and oriented to person, place, and time.     Cranial Nerves: No dysarthria.     Motor: Tremor present.     Coordination: Coordination normal.  Psychiatric:        Attention and Perception: Attention and perception normal. She does not perceive auditory or visual hallucinations.        Mood and Affect: Mood is anxious and depressed. Affect is tearful. Affect is not labile, blunt, angry or inappropriate.        Speech: Speech normal. Speech is not rapid and pressured or slurred.        Behavior: Behavior is not agitated. Behavior is cooperative.        Thought Content: Thought content normal. Thought content is not paranoid or delusional. Thought content does not include homicidal or suicidal ideation. Thought content does not include homicidal or suicidal plan.        Cognition and Memory: Cognition normal. She exhibits impaired recent memory.        Judgment: Judgment normal.     Comments: Insight fair. More distressed over H's dx cancer.  More depressed and not caring about things. Resting tremor maybe worse.     Lab Review:     Component Value Date/Time   NA 139 03/02/2020 1433   K 4.5 03/02/2020 1433   CL 103 03/02/2020 1433   CO2 22 03/02/2020 1433   GLUCOSE 83 03/02/2020  1433   GLUCOSE 102 (H) 01/19/2020 1158   BUN 15 03/02/2020 1433   CREATININE 0.76 03/02/2020 1433    CALCIUM 9.4 03/02/2020 1433   PROT 6.8 07/22/2020 1338   PROT 6.4 03/02/2020 1433   ALBUMIN 4.3 07/22/2020 1338   ALBUMIN 4.3 03/02/2020 1433   AST 15 07/22/2020 1338   ALT 10 07/22/2020 1338   ALKPHOS 67 07/22/2020 1338   BILITOT 0.4 07/22/2020 1338   BILITOT 0.2 03/02/2020 1433   GFRNONAA 83 03/02/2020 1433   GFRAA 96 03/02/2020 1433       Component Value Date/Time   WBC 6.0 03/02/2020 1433   WBC 7.4 01/19/2020 1158   RBC 4.39 03/02/2020 1433   RBC 4.19 01/19/2020 1158   HGB 13.7 03/02/2020 1433   HCT 40.4 03/02/2020 1433   PLT 219 03/02/2020 1433   MCV 92 03/02/2020 1433   MCH 31.2 03/02/2020 1433   MCH 30.3 01/19/2020 1158   MCHC 33.9 03/02/2020 1433   MCHC 32.1 01/19/2020 1158   RDW 12.6 03/02/2020 1433   LYMPHSABS 1.6 03/02/2020 1433   MONOABS 0.7 04/04/2019 1004   EOSABS 0.2 03/02/2020 1433   BASOSABS 0.0 03/02/2020 1433  Vitamin D level 33 on 10K units daily on 12/4/`9 Increased to prescription vitamin d 50K units Monday, Wed, Friday.  Rx sent in.   Lithium Lvl  Date Value Ref Range Status  10/21/2018 0.18 (L) 0.60 - 1.20 mmol/L Final    Comment:    Performed at [facility], [address], [place], [place] [address]     No results found for: PHENYTOIN, PHENOBARB, VALPROATE, CBMZ   .res Assessment: Plan:    Bipolar I disorder with rapid cycling ([place]) - Plan: pramipexole (MIRAPEX) 0.25 MG tablet, cariprazine (VRAYLAR) capsule  Generalized anxiety disorder  Attention deficit hyperactivity disorder (ADHD), predominantly inattentive type - Plan: dexmethylphenidate (FOCALIN XR) 20 MG 24 hr capsule, dexmethylphenidate (FOCALIN XR) 20 MG 24 hr capsule  Tremor due to multiple drugs - Plan: pramipexole (MIRAPEX) 0.25 MG tablet  Mild cognitive impairment  Low vitamin D level  Greater than 50% of 30 min face to face time with patient was spent on counseling and coordination of care. We discussed the following: Manasa has chronic rapid cycling bipolar disorder  which is chronically unstable and has been difficult to control.  The rapid cycling is making it difficult to control frequency of depressive episodes and the anxiety as well.  We have typically had to make frequent med changes.  We have had to reduce mood stabilizer dosages including Vraylar and Equetro she is very sensitive to carbamazepine because higher doses cause dizziness.   Overall rapid cycling pattern continues.   Still unstablecycling from mania to depression.  Increase loxapine to 40 mg HS with goal of weaning Vraylar bc pt doesn't tolerate enough Vraylar to manage mood.  Not change noted so far. Reduction in  Vraylar to 1.5 mg daily with goal of weaning it if possible without noticeable change so far.  However concern about worsening depression with added stress..  Since last year when she was stable she started having hypomanic symptoms again.  We increased Equetro back to 300 mg nightly but she she has had balance problems.  It had stopped the manic symptoms but then she started having balance problems again and we had to switch it back to Equetro 300 and add carbamazepine immediate release 100 nightly.. Less neurological problems with reduction in Vraylar.   It could have been her prior  neurologic symptoms were related to the combination of medications.  But I am hesitant to increase the Vraylar unless absolutely necessary.  BC stress ADD less managed with Focalin.  Discussed the risk of stimulants including that that could contribute to mood instability and mood swings.  She appears to need it in order to function effectively at work.  She has a high residual anxiety.  It has been impossible to control all of her symptoms simultaneously without causing side effects.  She agrees.  Continue the following Discussed side effects of each medicine. Continue  Focalin XR 20 mg every morning  She wants to continue 10 mg immediate release daily but try skipping to see if anxiety is  better. Equetro 200 mg nightly Carbamazepine immediate release 100 mg nightly Lamotrigine 200 mg twice daily Lithium 150 mg nightly Continue Vraylar 1.5 mg every day (conisder reduction) continue loxapine to 25 mg BID for longer trial.  If cannot get some meds covered will have marked relapse risks DT multiple other med failures and intolerances. Option increase loxapine but not likely to help depression.  Discussed potential metabolic side effects associated with atypical antipsychotics, as well as potential risk for movement side effects. Advised pt to contact office if movement side effects occur.  She may be having some mild TD with toe movement.  Option treat tremor.  Increase pramipexole to try to help tremor and mood off label.  Disc risk mania.  She wants to do it.. Increase to 0.25 mg BID.  Disc SE meds and this is heightened by the complication of necessary polypharmacy.  FU results of PT for balance.    Supportive therapy in terms of dealing with husband's addiction and now new dx metastatic prostate CA.Marland Kitchen  She's interested in Bipolar Support Group and info given.  Requires frequent FU DT chronic instability.  FU 6 weeks.  Lynder Parents MD, DFAPA  Please see After Visit Summary for patient specific instructions.    Future Appointments  Date Time Provider [place]  08/12/2020  9:00 AM Shanon Ace, LCSW [facility] None  08/19/2020  9:00 AM Shanon Ace, LCSW [facility] None  08/26/2020 12:00 PM Shanon Ace, LCSW [facility] None  09/02/2020  9:00 AM Shanon Ace, LCSW [facility] None  09/07/2020 10:00 AM Cottle, [place]., MD CP[facility] None    No orders of the defined types were placed in this encounter.     -------------------------------

## 2020-08-11 ENCOUNTER — Other Ambulatory Visit: Payer: Self-pay

## 2020-08-11 ENCOUNTER — Ambulatory Visit (INDEPENDENT_AMBULATORY_CARE_PROVIDER_SITE_OTHER): Payer: BC Managed Care – PPO | Admitting: Psychiatry

## 2020-08-11 DIAGNOSIS — F319 Bipolar disorder, unspecified: Secondary | ICD-10-CM | POA: Diagnosis not present

## 2020-08-11 NOTE — Progress Notes (Signed)
      [facility] Counselor/Therapist Progress Note  Patient ID: JENDAYA GOSSETT, MRN: [id number],    Date: 08/11/2020  Time Spent: 60 minutes    12:00noon to 1:00pm   Treatment Type: Individual Therapy  Reported Symptoms: anxiety, depression, overwhelmed  Mental Status Exam:  Appearance:   Casual     Behavior:  Appropriate, Sharing and Motivated  Motor:  Normal  Speech/Language:   Clear and Coherent  Affect:  anxious, depression, overwhelmed  Mood:  anxious and depressed  Thought process:  goal directed  Thought content:    WNL  Sensory/Perceptual disturbances:    WNL  Orientation:  oriented to person, place, time/date, situation, day of week, month of year and year  Attention:  Good  Concentration:  Good and Fair  Memory:  forgetting some and worse with stress  Fund of knowledge:   Good  Insight:    Good and Fair  Judgment:   Good and Fair  Impulse Control:  Good   Risk Assessment: Danger to Self:  No Self-injurious Behavior: No Danger to Others: No Duty to Warn:no Physical Aggression / Violence:No  Access to Firearms a concern: No  Gang Involvement:No   Subjective:  Patient today reports anxiety, depression, and overwhelmed, mostly due to husband's cancer diagnosis and treatments.   Interventions: Solution-Oriented/Positive Psychology and Ego-Supportive  Diagnosis:   ICD-10-CM   1. Bipolar I disorder with rapid cycling ([place])  F31.9      Plan: Patient not signing tx plan on computer screen due to [place].  Treatment Goals: Goals remain on plan as patient works on strategies to meet her goals. Progress is noted each visit in "Progress" section of goal plan.  Long Term Goal: Reduce overall level, frequency, and intensity of the anxiety so that daily functioning is not impaired.  Short Term Goal: 1.Increase understanding of the beliefs and messages that produce the worry and anxiety.  Strategies: 1.Help client develop reality-based positive  cognitive messages/self-talk. 2. Develop a "coping card" or other reminder which coping strategies are recorded for patient's later use .  PROGRESS: Patient in today reporting anxiety, depression, and feeling overwhelmed at times (emotionally and physically). Depression is stronger symptom right now due husband's cancer. Processed today her anxious, fearful, and depressive thoughts/feelings regarding her husband's cancer and treatment. Also feeling very overwhelmed in helping support husband "and staying on top of household duties etc.".  States husband has stopped drinking. Discussed her self-care that needs to be improved including nutrition, maintaining contact with supportive people, stop judging herself harshly, being sure to notice when things go right versus wrong, letting her faith be a helpful resource, walking as she is able, and get back into journaling her thoughts and feelings daily especially during this difficult time for  Patient and husband.  Goal review and progress/challenges noted with patient.  Next appt within 2 weeks.   Shanon Ace, LCSW

## 2020-08-12 ENCOUNTER — Ambulatory Visit: Payer: BC Managed Care – PPO | Admitting: Psychiatry

## 2020-08-13 ENCOUNTER — Other Ambulatory Visit: Payer: Self-pay | Admitting: Psychiatry

## 2020-08-13 DIAGNOSIS — F319 Bipolar disorder, unspecified: Secondary | ICD-10-CM

## 2020-08-13 NOTE — Telephone Encounter (Signed)
Please review her dose

## 2020-08-19 ENCOUNTER — Ambulatory Visit: Payer: BC Managed Care – PPO | Admitting: Psychiatry

## 2020-08-21 ENCOUNTER — Other Ambulatory Visit: Payer: Self-pay | Admitting: Psychiatry

## 2020-08-21 ENCOUNTER — Telehealth: Payer: Self-pay | Admitting: Psychiatry

## 2020-08-21 DIAGNOSIS — G251 Drug-induced tremor: Secondary | ICD-10-CM

## 2020-08-21 DIAGNOSIS — F319 Bipolar disorder, unspecified: Secondary | ICD-10-CM

## 2020-08-21 MED ORDER — PRAMIPEXOLE DIHYDROCHLORIDE 0.5 MG PO TABS: 0.5000 mg | ORAL_TABLET | Freq: Two times a day (BID) | ORAL | 0 refills | Status: DC

## 2020-08-21 NOTE — Telephone Encounter (Signed)
Pt called and said that the mirapex .25mg  she is taking is not helping, and could you please increase the dosage. Please send to [facility] in [place].

## 2020-08-21 NOTE — Telephone Encounter (Signed)
Done

## 2020-08-26 ENCOUNTER — Other Ambulatory Visit: Payer: Self-pay

## 2020-08-26 ENCOUNTER — Ambulatory Visit: Payer: BC Managed Care – PPO | Admitting: Psychiatry

## 2020-08-26 ENCOUNTER — Ambulatory Visit: Payer: BC Managed Care – PPO | Admitting: Podiatry

## 2020-08-26 ENCOUNTER — Ambulatory Visit (INDEPENDENT_AMBULATORY_CARE_PROVIDER_SITE_OTHER): Payer: BC Managed Care – PPO | Admitting: Psychiatry

## 2020-08-26 DIAGNOSIS — M1990 Unspecified osteoarthritis, unspecified site: Secondary | ICD-10-CM

## 2020-08-26 DIAGNOSIS — M949 Disorder of cartilage, unspecified: Secondary | ICD-10-CM

## 2020-08-26 DIAGNOSIS — F319 Bipolar disorder, unspecified: Secondary | ICD-10-CM

## 2020-08-26 DIAGNOSIS — M899 Disorder of bone, unspecified: Secondary | ICD-10-CM | POA: Diagnosis not present

## 2020-08-26 NOTE — Progress Notes (Signed)
[facility] Counselor/Therapist Progress Note  Patient ID: MOYA DUAN, MRN: [id number],    Date: 08/26/2020  Time Spent: 60 minutes   12:00noon to 1:00pm  Treatment Type: Individual Therapy  Reported Symptoms: anxiety, depression, anger, fearful thoughts, overwhelmed, inner chaos, can fly off the handle easy when I'm overwhelmed, hard to stay in the present  Mental Status Exam:  Appearance:   Casual     Behavior:  Appropriate and Sharing  Motor:  Normal  Speech/Language:   Clear and Coherent  Affect:  anxious, depressed, fearful  Mood:  anxious, depressed, irritable and fearful "at times"  Thought process:  goal directed  Thought content:    some obsessivness  Sensory/Perceptual disturbances:    WNL  Orientation:  oriented to person, place, time/date, situation, day of week, month of year and year  Attention:  Good  Concentration:  Fair  Memory:  WNL patient denies  Fund of knowledge:   Good  Insight:    Fair  Judgment:   Fair  Impulse Control:  Fair   Risk Assessment: Danger to Self:  No Self-injurious Behavior: No Danger to Others: No Duty to Warn:no Physical Aggression / Violence:No  Access to Firearms a concern: No  Gang Involvement:No   Subjective: Patient in today reporting anxiety, depression, fearful thoughts, anger, inner chaos, sometimes" flies off the handle", and difficult to stay in the present. "I don't feel like I've dealt very well with my symptoms but have been going to med appts and treatments with my husband for his cancer."   Interventions: Solution-Oriented/Positive Psychology and Ego-Supportive  Diagnosis:   ICD-10-CM   1. Bipolar I disorder with rapid cycling ([place])  F31.9      Plan: Patient not signing tx plan on computer screen due to [place].  Treatment Goals: Goals remain on plan as patient works on strategies to meet her goals. Progress is noted each visit in "Progress" section of goal plan.  Long Term Goal: Reduce  overall level, frequency, and intensity of the anxiety so that daily functioning is not impaired.  Short Term Goal: 1.Increase understanding of the beliefs and messages that produce the worry and anxiety.  Strategies: 1.Help client develop reality-based positive cognitive messages/self-talk. 2. Develop a "coping card" or other reminder which coping strategies are recorded for patient's later use .  PROGRESS: Patient in today reporting anxiety and depression, fearful thoughts, some anger "at times", "inner chaos", "flies off the handle sometimes, and finds it hard to stay in the present. Very upset today as husband has started back drinking regularly. Patient tearfully processed more of her anticipatory grief, frustration, fears, anger, depression, and feeling overwhelmed. Fears the future and wonders if husband is giving up, with him going "back to drinking again." Working today with her strategy and short and long term goals in treatment plan above to help interrupt her anxious thoughts and replace them with more reality-based, empowering thoughts that do not support anxiety. Encouraged patient to practice positive self-care including keeping in touch with people that are supportive of her, refrain from self judgment, improved nutrition, walking as she is able, use journaling and other strategies we have discussed to help with her anxiousness and depressed feelings, and letting her faith continue to be a support to her being this difficult time with her husband's cancer.  Goal review and progress/challenges noted with patient.  Next appointment within 2 weeks.   Shanon Ace, LCSW

## 2020-08-31 ENCOUNTER — Encounter: Payer: Self-pay | Admitting: Podiatry

## 2020-08-31 NOTE — Progress Notes (Signed)
Subjective:  Patient ID: Megan Whitney, female    DOB: 01-Jul-1956,  MRN: [id number]  Chief Complaint  Patient presents with  . Foot Pain    Left foot pain. PT stated that the pain has eased off some but she is still having some issues with that ankle.    64 y.o. female presents with the above complaint. Patient presents with a follow-up of left chronic ankle arthritis with underlying OCD lesion on the medial lateral side. Patient states injection helped a lot. She states her ankle is feeling a lot better. She just of 43-month follow-up which seems to be doing good overall. She has been hold off on injection at this time. She denies any other acute complaints.   Review of Systems: Negative except as noted in the HPI. Denies N/V/F/Ch.  Past Medical History:  Diagnosis Date  . ADD (attention deficit disorder)   . Allergy    Seasonal  . Anemia    History of GI blood loss  . Anxiety   . Arthritis   . Bipolar 1 disorder ([place])   . Cancer ([place])   . Colon polyps   . Depression   . Diabetes mellitus ([place])   . Edema, lower extremity   . Epistaxis    Around 2011 or 2012, required cauterization.   . Esophageal stricture   . GERD (gastroesophageal reflux disease)   . Headache(784.0)   . Hyperlipidemia   . Interstitial cystitis   . Joint pain   . Lactose intolerance   . Lung cancer ([place]) 2002  . Obesity   . Osteoarthritis   . Palpitations   . Sleep apnea    Doesn't use a CPAP  . Swallowing difficulty     Current Outpatient Medications:  .  atorvastatin (LIPITOR) 20 MG tablet, Take 1 tablet (20 mg total) by mouth at bedtime., Disp: 90 tablet, Rfl: 0 .  carbamazepine (TEGRETOL) 100 MG chewable tablet, CHEW 1 TABLET BY MOUTH AT BEDTIME., Disp: 90 tablet, Rfl: 0 .  cariprazine (VRAYLAR) capsule, Take 1 capsule (1.5 mg total) by mouth daily., Disp: 30 capsule, Rfl: 1 .  cetirizine (ZYRTEC) 10 MG tablet, Take 1 tablet (10 mg total) by mouth daily., Disp: 90 tablet, Rfl: 3 .   cholecalciferol (VITAMIN D) 25 MCG (1000 UNIT) tablet, Take 1,000 Units by mouth daily., Disp: , Rfl:  .  dexmethylphenidate (FOCALIN XR) 20 MG 24 hr capsule, Take 1 capsule (20 mg total) by mouth daily., Disp: 30 capsule, Rfl: 0 .  [START ON 09/03/2020] dexmethylphenidate (FOCALIN XR) 20 MG 24 hr capsule, Take 1 capsule (20 mg total) by mouth daily., Disp: 30 capsule, Rfl: 0 .  EQUETRO 200 MG CP12 12 hr capsule, Take 1 capsule (200 mg total) by mouth at bedtime., Disp: 30 capsule, Rfl: 5 .  ferrous gluconate (FERGON) 324 MG tablet, Take 324 mg by mouth daily with breakfast., Disp: , Rfl:  .  lamoTRIgine (LAMICTAL) 200 MG tablet, TAKE (1) TABLET BY MOUTH TWICE DAILY., Disp: 180 tablet, Rfl: 1 .  lithium carbonate 150 MG capsule, Take 1 capsule (150 mg total) by mouth daily in the afternoon., Disp: 90 capsule, Rfl: 1 .  loxapine (LOXITANE) 25 MG capsule, Take 1 capsule (25 mg total) by mouth 2 (two) times daily., Disp: 60 capsule, Rfl: 0 .  pantoprazole (PROTONIX) 40 MG tablet, Take 1 tablet (40 mg total) by mouth daily., Disp: 90 tablet, Rfl: 2 .  pramipexole (MIRAPEX) 0.5 MG tablet, Take 1 tablet (0.5 mg total)  by mouth in the morning and at bedtime., Disp: 60 tablet, Rfl: 0 .  sulfamethoxazole-trimethoprim (BACTRIM DS) 800-160 MG tablet, Take 1 tablet by mouth 2 (two) times daily., Disp: 10 tablet, Rfl: 0  Social History   Tobacco Use  Smoking Status Never Smoker  Smokeless Tobacco Never Used    Allergies  Allergen Reactions  . Azithromycin Anaphylaxis  . Penicillins Anaphylaxis    DID THE REACTION INVOLVE: Swelling of the face/tongue/throat, SOB, or low BP? Yes Sudden or severe rash/hives, skin peeling, or the inside of the mouth or nose? Yes Did it require medical treatment? No When did it last happen? If all above answers are "NO", may proceed with cephalosporin use.  . Adhesive [Tape] Other (See Comments)    On bandaids   Objective:  There were no vitals filed for this  visit. There is no height or weight on file to calculate BMI. Constitutional Well developed. Well nourished.  Vascular Dorsalis pedis pulses palpable bilaterally. Posterior tibial pulses palpable bilaterally. Capillary refill normal to all digits.  No cyanosis or clubbing noted. Pedal hair growth normal.  Neurologic Normal speech. Oriented to person, place, and time. Epicritic sensation to light touch grossly present bilaterally.  Dermatologic Nails well groomed and normal in appearance. No open wounds. No skin lesions.  Orthopedic:  No pain on palpation to the medial lateral gutter of the ankle joint. No pain with posterior ankle joint as well.  No pain at the peroneal tendon, posterior tibial tendon, Achilles tendon, ATFL.  Pain with range of motion of the ankle joint dorsiflexion as well as plantarflexion active and passive.   Radiographs: None Assessment:   1. Osteochondral talar dome lesion   2. Arthritis    Plan:  Patient was evaluated and treated and all questions answered.  Left osteochondral lesion of the talar dome medial and lateral -Given that her pain is recurring every 3 months or so.  I do not mind continuing to do steroid injection if she tends to get about 3 to 4 months of relief.   -Hold off on steroid injection for now. Clinically her pain is doing a lot better. We will continue doing a steroid injection Move it hurts her. I will see her back again in 3 months.  No follow-ups on file.

## 2020-09-02 ENCOUNTER — Ambulatory Visit (INDEPENDENT_AMBULATORY_CARE_PROVIDER_SITE_OTHER): Payer: BC Managed Care – PPO | Admitting: Psychiatry

## 2020-09-02 ENCOUNTER — Ambulatory Visit: Payer: BC Managed Care – PPO | Admitting: Psychiatry

## 2020-09-02 ENCOUNTER — Other Ambulatory Visit: Payer: Self-pay

## 2020-09-02 DIAGNOSIS — F319 Bipolar disorder, unspecified: Secondary | ICD-10-CM

## 2020-09-02 NOTE — Progress Notes (Signed)
[facility] Counselor/Therapist Progress Note  Patient ID: Megan Whitney, MRN: [id number],    Date: 09/02/2020  Time Spent: 60 minutes   9:00am to 10:00am  Treatment Type: Individual Therapy  Reported Symptoms: anxiety, fears re: husband's cancer, anger, depression  Mental Status Exam:  Appearance:   Neat     Behavior:  Appropriate, Sharing and Motivated  Motor:  Normal  Speech/Language:   Clear and Coherent  Affect:  anxious  Mood:  anxious, depressed, sad and anger reduced  Thought process:  goal directed  Thought content:    some obsessiveness  Sensory/Perceptual disturbances:    WNL  Orientation:  oriented to person, place, time/date, situation, day of week, month of year and year  Attention:  Good  Concentration:  Good and Fair  Memory:  not much forgetfulness recently  [employer] of knowledge:   Good  Insight:    Good and Fair  Judgment:   Good and Fair  Impulse Control:  Fair   Risk Assessment: Danger to Self:  No Self-injurious Behavior: No Danger to Others: No Duty to Warn:no Physical Aggression / Violence:No  Access to Firearms a concern: No  Gang Involvement:No   Subjective: Patient today reporting anxiety, depression, fears, anger (better).  Fears re: husband's cancer and feels like she is "holding her own and not getting worse.   Interventions: Solution-Oriented/Positive Psychology and Ego-Supportive  Diagnosis:   ICD-10-CM   1. Bipolar I disorder with rapid cycling ([place])  F31.9      Plan: Patient not signing tx plan on computer screen due to [place].  Treatment Goals: Goals remain on plan as patient works on strategies to meet her goals. Progress is noted each visit in "Progress" section of goal plan.  Long Term Goal: Reduce overall level, frequency, and intensity of the anxiety so that daily functioning is not impaired.  Short Term Goal: 1.Increase understanding of the beliefs and messages that produce the worry and  anxiety.  Strategies: 1.Help client develop reality-based positive cognitive messages/self-talk. 2. Develop a "coping card" or other reminder which coping strategies are recorded for patient's later use .  PROGRESS: Patient in today reporting anxiety, depression, fears regarding husband's cancer, but feels more recently she has "held my own and not gotten any worse."  Is in contact with family and friends, and reminding herself that her "worst fears probably aren't what will actually happen." Upset with husband who has returned to drinking more heavily.  Processes her fears more today about husband's cancer and realizing that most are not based on reality and what doctors have said.  She feels they are waiting for treatment to progress more before providing more insight as to how they feel treatments will help him and what to expect after treatments. Feels like she is frequently "living in anxiety" and "worrying about what might happen and if I'll be able to handle it."  Worked on this with patient today and she was able to eventually see how her jumping ahead and imagining "worst case scenario" was creating more stress and anxiety for her rather than her being able to live more fully in the present "without adding more inner chaos". She is to work with this more between appointments.  Encouraged patient in her staying in contact with people who are supportive of her, to practice positive self-care especially working to make self talk more positive and encouraging rather than judgmental, getting outside and walking some as she is able, using her journaling and other  strategies discussed to help with her anxiety and depression, using her faith as a support emotionally, and working more intentionally on staying in the present and not assuming worse case scenarios.  Goal review and progress/challenges noted with patient.  Next appointment within 1 to 2 weeks.   Shanon Ace,  LCSW

## 2020-09-07 ENCOUNTER — Other Ambulatory Visit: Payer: Self-pay | Admitting: Psychiatry

## 2020-09-07 ENCOUNTER — Encounter: Payer: Self-pay | Admitting: Psychiatry

## 2020-09-07 ENCOUNTER — Telehealth (INDEPENDENT_AMBULATORY_CARE_PROVIDER_SITE_OTHER): Payer: Medicare Other | Admitting: Psychiatry

## 2020-09-07 ENCOUNTER — Telehealth: Payer: Self-pay | Admitting: Psychiatry

## 2020-09-07 DIAGNOSIS — F900 Attention-deficit hyperactivity disorder, predominantly inattentive type: Secondary | ICD-10-CM

## 2020-09-07 DIAGNOSIS — G3184 Mild cognitive impairment, so stated: Secondary | ICD-10-CM

## 2020-09-07 DIAGNOSIS — F319 Bipolar disorder, unspecified: Secondary | ICD-10-CM

## 2020-09-07 DIAGNOSIS — F411 Generalized anxiety disorder: Secondary | ICD-10-CM | POA: Diagnosis not present

## 2020-09-07 DIAGNOSIS — G251 Drug-induced tremor: Secondary | ICD-10-CM

## 2020-09-07 DIAGNOSIS — R7989 Other specified abnormal findings of blood chemistry: Secondary | ICD-10-CM

## 2020-09-07 MED ORDER — DEXMETHYLPHENIDATE HCL ER 20 MG PO CP24: 20.0000 mg | ORAL_CAPSULE | Freq: Every day | ORAL | 0 refills | Status: DC

## 2020-09-07 NOTE — Progress Notes (Signed)
Megan Whitney [id number] 06-May-1956 65 y.o.   Video Visit via My Chart  I connected with pt by My Chart and verified that I am speaking with the correct person using two identifiers.   I discussed the limitations, risks, security and privacy concerns of performing an evaluation and management service by My Chart  and the availability of in person appointments. I also discussed with the patient that there may be a patient responsible charge related to this service. The patient expressed understanding and agreed to proceed.  I discussed the assessment and treatment plan with the patient. The patient was provided an opportunity to ask questions and all were answered. The patient agreed with the plan and demonstrated an understanding of the instructions.   The patient was advised to call back or seek an in-person evaluation if the symptoms worsen or if the condition fails to improve as anticipated.  I provided 30 minutes of video time during this encounter.  The patient was located at home and the provider was located office. Session started at 10 and ended at 10:30 AM  Subjective:   Patient ID:  Megan Whitney is a 65 y.o. (DOB 1955-11-23) female.   Chief Complaint:  Chief Complaint  Patient presents with  . Follow-up  . Depression  . Anxiety  . Medication Problem    Depression        Associated symptoms include decreased concentration.  Associated symptoms include no suicidal ideas.  Past medical history includes anxiety.   Anxiety Symptoms include decreased concentration and nervous/anxious behavior. Patient reports no chest pain, confusion, dizziness, nausea, palpitations or suicidal ideas.    Medication Refill Associated symptoms include arthralgias. Pertinent negatives include no chest pain, nausea or weakness.   Megan Whitney is  follow-up of r chronic mood swings and anxiety and frequent changes in medications.   At visit December 27, 2018.  Focalin XR was increased  from 20 mg to 25 mg daily to help with focus and attention and potentially mood.  When seen February 13, 2019.  In an effort to reduce mood cycling we reduce fluoxetine to 20 mg daily.  At visit August 2020.  No meds were changed.  She continued the following: Focalin XR 25 mg every morning and Focalin 10 mg immediate release daily Equetro 200 mg nightly Fluoxetine 20 mg daily Lamotrigine 200 mg twice daily Lithium 150 mg nightly Vraylar 3 mg daily  She called back November 4 after seeing her therapist stating that she was having some hypomanic symptoms with reduced sleep and increased energy.  This potentiality had been discussed and the decision was made to increase Equetro from 200 mg nightly to 300 mg nightly.  seen August 12, 2019.  Because of balance problems she did not tolerate Equetro 300 mg nightly and it was changed to Equetro 200 mg nightly plus immediate release carbamazepine 100 mg nightly.  Her mood had not been stable enough on Equetro 200 mg nightly alone. Less balance problems with change in CBZ.  seen September 23, 2019.  The following was changed: For bipolar mixed increase CBZ IR to 200 mg HS.  Disc fall and balance risks.For bipolar mixed increase CBZ IR to 200 mg HS.  Disc fall and balance risks.  She called back October 23, 2019 stating she had had another fall and felt it was due to the medication.  Therefore carbamazepine immediate release was reduced from 200 mg nightly to 100 mg nightly.  The Equetro is unchanged.  Last seen November 04, 2019.  The following was noted:  Better at the moment but balance is still somewhat of a problem.  Started PT to help balance.  Had a fall after tripping on a curb and hit her head on sidewalk.  Got a concussion with nausea and HA and dizziness and light sensitivity.  Not over it.  Concentration problems.  Has gotten back to work after a week.   Mood sx pretty good with some mild depression.  Nothing severe.  Trying to minimize stress and  self care as much as possible.  No manic sx lately and sleeping fairly well.  No racing thoughts.   Working another year and plans to retire but H alcoholic and not sure it will be good to be there all the time. Seeing therapist q 2 weeks.  Therapy helping . Recent serum vitamin D level was determined to be low at 33.  The goal and chronically depressed patient's is in the 50s if possible.  So her vitamin D was increased on August 08, 2018 or thereabouts.  Checked vitamin D level again and this time it was high at 120 and so it was stopped.  She's restarted per PCP at 1000 units daily.  01/06/2020 appointment the following is noted: Still on: Focalin XR 25 mg every morning and Focalin 10 mg immediate release daily Equetro 200 mg nightly Carbamazepine immediate release 100 mg nightly Fluoxetine 20 mg daily Lamotrigine 200 mg twice daily Lithium 150 mg nightly Vraylar 3 mg daily Not good manic.  Angry.  Missed 2 days bc sx.  Last week vacation which didn't go well.  Crying last week and missed a day.  "Pissed off at the whole world" but also depressed and hard to get OOB today.  Everything makes me angry.   Blows up without control.  Then regrets it.  Sleep irregular lately. Finished PT which might have helped some but still balance problems. Plan: Cannot increase carbamazepine due to balance issues Temporarily Ativan for agitation 0.5 mg tablets  DT mania stop fluoxetine If fails trial loxapine  01/15/2020 patient called after hours with suicidal thoughts and patient was to go to the [facility]. Patient ultimately admitted to [facility] psychiatric unit.  Dr. Clovis Pu spoke with clinical pharmacist they are giving history of medication experience and recommendation for loxapine.  Patient hospital stay for 3 days and discharged on loxapine 10 mg nightly as the new medication.  02/10/2020 phone call patient complaining of insomnia.  Loxapine was increased from 10  to 20 mg nightly due to recent insomnia with mania.  02/14/2020 appointment with the following noted: Lately in tears Monday and Tuesday convinced she couldn't do her job.  Better last couple of days.  Motivation is not real good but not depressed like Monday and Tuesday. This week missing some meds bc couldn't get like Focalin.  Been taking other meds. No sig manic sx.  Sleep is better with more loxapine about 8 hours. Anxiety is chronic.  No SE loxapine so far unless a little dizzy here and there. No med changes.  02/25/2020 appointment urgently made after patient was recently hospitalized.  The following is noted: Unstable.  Today manic driving erratically.  Talking a mile a minute.  Not thinking clearly.  Angry.  Slept OK last night.  Hyperactive with poor productivity for a couple of days.  Weekend OK overall.   No falls lately. More tremor lately.  Retiring July 30.  Plan: For tremor amantadine 100 mg twice a day if needed. Increase loxapine to 3 capsules 1 to 2 hours before bedtime Reduce Vraylar to 1.5 mg daily or 3 mg every other day.   04/01/2020 appointment with the following noted: Amantadine hs caused NM. Low grade depression for a couple of weeks.  Not severe.   Extended work date 06/04/20 to retire date.  She feels OK about it in some ways but doesn't feel fully up to it.  Doesn't remember when hypomania resolved from last visit.   Sleep is much better now uninterrupted. Hard to remember lithium at lunch. Still has tremor but better with amantadine.  Anxiety still through the roof. Plan: Increase loxapine 40 mg HS.  05/04/20 appt with the following noted:  Increased loxapine to 40.  Anxiety no better.  All kinds of reasons including worry about retirement and paying for things, but worry is probably exaggerated and H say sit is. Sleep good usually.  No SE noted.  Not making her sleep more with change. Still some manic sx including shortly after last visit and then depressed  until the last week.  Irritable and angry. Some panic with SOB and fear of MI. Plan: Continue Vraylar 1.5 mg every day (conisder reduction) Increase loxapine to 50 mg daily for 1 week and if no improvement then increase to 75 mg each night (or 3 of the 25 mg capsules)  Multiple phone calls between appointments with the patient complaining loxapine was causing insomnia.  She has adjusted on timing and dose as she felt it was necessary to make it tolerable because when she takes it in the morning she gets sleepy if she takes very much.  06/09/20 appt Noted: Max tolerated loxapine 25 mg BID.  More than that HS gives strange dreams and difficult to go back to sleep and more in AM too sedated. Not doing well.  Anxiety through the roof.  Did ok with vacation but home worries about everything.   Retired.  Has a lot of time to generally worry.  Started reading again for the first time in awhile.  That's helpful. Takes a while to adjust to retirement.  Anxiety and depression feed each other.  Less interest in some activities.  Later in afternoon is not quite as anxious. Hard to drive with anxiety.   Plan: Reduce to see if it helps reduce anxiety.  Focalin XR 20 mg every morning  and stop Focalin 10 mg immediate release daily Equetro 200 mg nightly Carbamazepine immediate release 100 mg nightly Lamotrigine 200 mg twice daily Lithium 150 mg nightly Continue Vraylar 1.5 mg every day (conisder reduction) continue loxapine to 25 mg BID for longer trial.  07/07/20 appt with the following noted: Tearful and overwhelmed  By [facility] dx of prostate CA with mets bones and nodes with plans for hormone tx and radiation and chemotherapy.  Found out about 3 weeks ago.   He's in sig pain and she's caregiving.  Hard for him to walk even on walker.  Is falling to pieces but realizes it's typical but bc bipolar may be affecting her harder.  Tearful a lot.  Forgetting things, distracted, personal routine disrupted. She still  feels the focalin is helpful.  Poor sleep last night bc H but usually 7-8 hours. No effect noticed from Amantadine for tremor. CO more depressed. Plan: Option treat tremor.  change amanatadine 100 mg AM to pramipexole to try to help tremor and mood off label.  Disc risk mania.  She wants to do it..  07/14/2020 phone call:Megan Whitney called to report that she will be starting Medicare as of January, 2022.  She will be on regular medicare A&B and prescription plan D.  Her Vraylar and [employer] will NOT be covered by medicare.  She needs to know if there are other medications to replace these.  The cost for these medications is over $6000 and she can't afford that price.  She has an appt 12/2, but needs to know asap if there are going to be alternate medications and what they are so she can check on coverage. MD response: There are no reasonable alternatives to these medications that will work in the same way.  She needs to get a better Medicare D plan that will cover the Vraylar and Equetro or her psychiatric symptoms will get worse if she stops these medications.  There are better Medicare D plans that we will cover these medicines but obviously those plans are more expensive but I can have no control over that.  08/06/2020 appointment with the following noted: Tremor no better and maybe worse with switch from to pramipexole 0.125 mg BID from Amantadine.  No SE. Depressed and anxious and crying a lot.  Hard to tell if related to H.  Anxiety definitely related to H.  H can't do very much bc pain and on pain meds and anemic.  Transfusion yesterday.  H can't drive or shop.  Too weak.  Says she can't find a medicare plan that will cover Equetro and [employer]. Plan: She wants to continue 10 mg immediate release Focalin daily but try skipping to see if anxiety is better. Increase pramipexole to try to help tremor and mood off label.  Disc risk mania.  She wants to do it.. Increase to 0.5 mg BID.  09/07/2020 appointment  with the following noted: At last appointment patient was more depressed and anxious and complaining of tremor.  Additional stress with husband's cancer and poor health. Severe anger problems with 0.5 mg BID and mood swings on pramipexole after a week.  Reduced to 0.5 mg AM and still having the problem. Helped tremor tremdously at the higher dose and worse with lower dose.  Tremor same all day except worse with stress.   Stopped Focalin IR without change. Things have been tough and dealing with depression.  H's cancer really affecting me.  Causing depression and anxiety and often in tears.  Able to care for herself and H.  He doesn't require a lot of care but she's not strong emotionally.   Now on [facility] and worry over med coverage.  Past Psychiatric Medication Trials: Vraylar 4.5 SE mouth movements reduced to 3 mg 3/20 lithium 150,   Trileptal 450, Depakote, Equetro 300 balance issues, CBZ ER falling,  Lamictal 200 twice daily, Latuda 80, , olanzapine, Seroquel, risperidone, Abilify, loxapine 25 mg BID (max tolerated) Focalin,  Ritalin,  fluoxetine 60,  buspirone, sertraline 100, Wellbutrin history of facial tics, paroxetine cognitive side effects ropinirole, amantadine, Sinemet, Artane, Cogentin, pramipexole 0.5 mg BID helped tremor trazodone hangover, Ambien hangover,  Review of Systems:  Review of Systems  HENT: Positive for tinnitus.        Chirping cricket sounds in hears since January  Cardiovascular: Negative for chest pain and palpitations.  Gastrointestinal: Negative for nausea.  Musculoskeletal: Positive for arthralgias. Negative for gait problem.  Neurological: Positive for tremors. Negative for dizziness, seizures, syncope, weakness and light-headedness.       Still balance problems. No  falls lately.  Psychiatric/Behavioral: Positive for decreased concentration, depression and dysphoric mood. Negative for agitation, behavioral problems, confusion, hallucinations, self-injury,  sleep disturbance and suicidal ideas. The patient is nervous/anxious. The patient is not hyperactive.   No falls since here. Not currently depressed but unable to remove this from the list.  Medications: I have reviewed the patient's current medications.  Current Outpatient Medications  Medication Sig Dispense Refill  . atorvastatin (LIPITOR) 20 MG tablet Take 1 tablet (20 mg total) by mouth at bedtime. 90 tablet 0  . carbamazepine (TEGRETOL) 100 MG chewable tablet CHEW 1 TABLET BY MOUTH AT BEDTIME. 90 tablet 0  . cariprazine (VRAYLAR) capsule Take 1 capsule (1.5 mg total) by mouth daily. 30 capsule 1  . cetirizine (ZYRTEC) 10 MG tablet Take 1 tablet (10 mg total) by mouth daily. 90 tablet 3  . cholecalciferol (VITAMIN D) 25 MCG (1000 UNIT) tablet Take 1,000 Units by mouth daily.    Marland Kitchen dexmethylphenidate (FOCALIN XR) 20 MG 24 hr capsule Take 1 capsule (20 mg total) by mouth daily. 30 capsule 0  . dexmethylphenidate (FOCALIN XR) 20 MG 24 hr capsule Take 1 capsule (20 mg total) by mouth daily. 30 capsule 0  . EQUETRO 200 MG CP12 12 hr capsule Take 1 capsule (200 mg total) by mouth at bedtime. 30 capsule 5  . ferrous gluconate (FERGON) 324 MG tablet Take 324 mg by mouth daily with breakfast.    . lamoTRIgine (LAMICTAL) 200 MG tablet TAKE (1) TABLET BY MOUTH TWICE DAILY. 180 tablet 1  . lithium carbonate 150 MG capsule Take 1 capsule (150 mg total) by mouth daily in the afternoon. 90 capsule 1  . loxapine (LOXITANE) 25 MG capsule Take 1 capsule (25 mg total) by mouth 2 (two) times daily. 60 capsule 0  . pantoprazole (PROTONIX) 40 MG tablet Take 1 tablet (40 mg total) by mouth daily. 90 tablet 2  . pramipexole (MIRAPEX) 0.5 MG tablet Take 1 tablet (0.5 mg total) by mouth in the morning and at bedtime. (Patient taking differently: Take 0.5 mg by mouth in the morning.) 60 tablet 0  . sulfamethoxazole-trimethoprim (BACTRIM DS) 800-160 MG tablet Take 1 tablet by mouth 2 (two) times daily. 10 tablet 0    No current facility-administered medications for this visit.    Medication Side Effects: Other: tremor and weight gain.  Dyskinesia appears better  SE bettter than they were.  Balance problems intermittently  Allergies:  Allergies  Allergen Reactions  . Azithromycin Anaphylaxis  . Penicillins Anaphylaxis    DID THE REACTION INVOLVE: Swelling of the face/tongue/throat, SOB, or low BP? Yes Sudden or severe rash/hives, skin peeling, or the inside of the mouth or nose? Yes Did it require medical treatment? No When did it last happen? If all above answers are "NO", may proceed with cephalosporin use.  . Adhesive [Tape] Other (See Comments)    On bandaids    Past Medical History:  Diagnosis Date  . ADD (attention deficit disorder)   . Allergy    Seasonal  . Anemia    History of GI blood loss  . Anxiety   . Arthritis   . Bipolar 1 disorder ([place])   . Cancer ([place])   . Colon polyps   . Depression   . Diabetes mellitus ([place])   . Edema, lower extremity   . Epistaxis    Around 2011 or 2012, required cauterization.   . Esophageal stricture   . GERD (gastroesophageal reflux disease)   . Headache(784.0)   .  Hyperlipidemia   . Interstitial cystitis   . Joint pain   . Lactose intolerance   . Lung cancer ([place]) 2002  . Obesity   . Osteoarthritis   . Palpitations   . Sleep apnea    Doesn't use a CPAP  . Swallowing difficulty     Family History  Problem Relation Age of Onset  . Arthritis Mother   . Hearing loss Mother   . Hyperlipidemia Mother   . Hypertension Mother   . Depression Mother   . Anxiety disorder Mother   . Obesity Mother   . Sudden death Mother   . Hypertension Father   . Diabetes Mellitus II Father   . Heart disease Father   . Arthritis Father   . Cancer Father        Brain  . COPD Father   . Diabetes Father   . Hyperlipidemia Father   . Sleep apnea Father   . Early death Sister        Aneroxia/Bulimic  . Depression Brother   . Early death  Investment banker, corporate  . Depression Daughter   . Drug abuse Daughter   . Heart disease Daughter   . Hypertension Daughter   . Stroke Maternal Grandmother   . Hypertension Maternal Grandmother   . Arthritis Maternal Grandfather   . Heart attack Maternal Grandfather   . Hearing loss Maternal Grandfather   . Colon cancer Neg Hx   . Esophageal cancer Neg Hx   . Rectal cancer Neg Hx     Social History   Socioeconomic History  . Marital status: Married    Spouse name: Not on file  . Number of children: 1  . Years of education: Not on file  . Highest education level: Not on file  Occupational History  . Occupation: Admin. assistant  Tobacco Use  . Smoking status: Never Smoker  . Smokeless tobacco: Never Used  Vaping Use  . Vaping Use: Never used  Substance and Sexual Activity  . Alcohol use: Yes    Alcohol/week: 1.0 standard drink    Types: 1 Glasses of wine per week    Comment: Moderate  . Drug use: No  . Sexual activity: Yes  Other Topics Concern  . Not on file  Social History Narrative   Pt lives in [place] with husband Lanny Hurst.  Followed by Dr. Clovis Pu for psychiatry and Rinaldo Cloud for therapy.   Social Determinants of Health   Financial Resource Strain: Not on file  Food Insecurity: Not on file  Transportation Needs: Not on file  Physical Activity: Not on file  Stress: Not on file  Social Connections: Not on file  Intimate Partner Violence: Not on file    Past Medical History, Surgical history, Social history, and Family history were reviewed and updated as appropriate.   Please see review of systems for further details on the patient's review from today.   Objective:   Physical Exam:  There were no vitals taken for this visit.  Physical Exam Neurological:     Mental Status: She is alert and oriented to person, place, and time.     Cranial Nerves: No dysarthria.  Psychiatric:        Attention and Perception: Attention and perception normal.         Mood and Affect: Mood is anxious and depressed. Affect is not tearful.        Speech: Speech normal.  Behavior: Behavior is cooperative.        Thought Content: Thought content normal. Thought content is not paranoid or delusional. Thought content does not include homicidal or suicidal ideation. Thought content does not include homicidal or suicidal plan.        Cognition and Memory: Cognition and memory normal.        Judgment: Judgment normal.     Comments: Insight intact Easily overwhelmed without change     Lab Review:     Component Value Date/Time   NA 139 03/02/2020 1433   K 4.5 03/02/2020 1433   CL 103 03/02/2020 1433   CO2 22 03/02/2020 1433   GLUCOSE 83 03/02/2020 1433   GLUCOSE 102 (H) 01/19/2020 1158   BUN 15 03/02/2020 1433   CREATININE 0.76 03/02/2020 1433   CALCIUM 9.4 03/02/2020 1433   PROT 6.8 07/22/2020 1338   PROT 6.4 03/02/2020 1433   ALBUMIN 4.3 07/22/2020 1338   ALBUMIN 4.3 03/02/2020 1433   AST 15 07/22/2020 1338   ALT 10 07/22/2020 1338   ALKPHOS 67 07/22/2020 1338   BILITOT 0.4 07/22/2020 1338   BILITOT 0.2 03/02/2020 1433   GFRNONAA 83 03/02/2020 1433   GFRAA 96 03/02/2020 1433       Component Value Date/Time   WBC 6.0 03/02/2020 1433   WBC 7.4 01/19/2020 1158   RBC 4.39 03/02/2020 1433   RBC 4.19 01/19/2020 1158   HGB 13.7 03/02/2020 1433   HCT 40.4 03/02/2020 1433   PLT 219 03/02/2020 1433   MCV 92 03/02/2020 1433   MCH 31.2 03/02/2020 1433   MCH 30.3 01/19/2020 1158   MCHC 33.9 03/02/2020 1433   MCHC 32.1 01/19/2020 1158   RDW 12.6 03/02/2020 1433   LYMPHSABS 1.6 03/02/2020 1433   MONOABS 0.7 04/04/2019 1004   EOSABS 0.2 03/02/2020 1433   BASOSABS 0.0 03/02/2020 1433  Vitamin D level 33 on 10K units daily on 12/4/`9 Increased to prescription vitamin d 50K units Monday, Wed, Friday.  Rx sent in.   Lithium Lvl  Date Value Ref Range Status  10/21/2018 0.18 (L) 0.60 - 1.20 mmol/L Final    Comment:    Performed at [facility], [address], [place], [place] [address]     No results found for: PHENYTOIN, PHENOBARB, VALPROATE, CBMZ   .res Assessment: Plan:    Bipolar I disorder with rapid cycling ([place])  Generalized anxiety disorder  Attention deficit hyperactivity disorder (ADHD), predominantly inattentive type  Mild cognitive impairment  Low vitamin D level  Tremor due to multiple drugs  Greater than 50% of 30 min face to face time with patient was spent on counseling and coordination of care. We discussed the following: Dellia has chronic rapid cycling bipolar disorder which is chronically unstable and has been difficult to control.  The rapid cycling is making it difficult to control frequency of depressive episodes and the anxiety as well.  We have typically had to make frequent med changes.  We have had to reduce mood stabilizer dosages including Vraylar and Equetro she is very sensitive to carbamazepine because higher doses cause dizziness.   Overall rapid cycling pattern continues.   Still unstablecycling from mania to depression.  Increased loxapine to 40 mg HS with goal of weaning Vraylar bc pt doesn't tolerate enough Vraylar to manage mood.  Not change noted so far. So wean and stop it loxapine due to NR and intolerance of higher dose.   Reduction in  Vraylar to 1.5 mg daily  with goal of weaning it if possible without noticeable change so far.  However concern about worsening depression with added stress..  Since last year when she was stable she started having hypomanic symptoms again.  We increased Equetro back to 300 mg nightly but she she has had balance problems. It had stopped the manic symptoms but then she started having balance problems again and we had to switch it back to Equetro 300 and add carbamazepine immediate release 100 nightly.. Less neurological problems with reduction in Vraylar.   It could have been her prior neurologic symptoms were related to the combination of  medications.  But I am hesitant to increase the Vraylar unless absolutely necessary.  Out of work ADD less of a problem.  Discussed the risk of stimulants including that that could contribute to mood instability and mood swings.   OK with reduction to just ER Focalin  She has a high residual anxiety.  It has been impossible to control all of her symptoms simultaneously without causing side effects.  She agrees.  Continue the following Discussed side effects of each medicine. Continue  Focalin XR 20 mg every morning  Equetro 200 mg nightly Carbamazepine immediate release 100 mg nightly Lamotrigine 200 mg twice daily Lithium 150 mg nightly Continue Vraylar 1.5 mg every day (conisder reduction)  If cannot get some meds covered will have marked relapse risks DT multiple other med failures and intolerances.  Discussed potential metabolic side effects associated with atypical antipsychotics, as well as potential risk for movement side effects. Advised pt to contact office if movement side effects occur.  She may be having some mild TD with toe movement.  Option treat tremor.  If pramipexole is not helpful at highest tolerated dose then stop it.    Few options likely to be tolerated that might help but consider other atypicals, once we see how insurance covers meds.  Disc SE meds and this is heightened by the complication of necessary polypharmacy.  Supportive therapy in terms of dealing with husband's addiction and now new dx metastatic prostate CA.Marland Kitchen  She's interested in Bipolar Support Group and info given.  Requires frequent FU DT chronic instability.  FU 6 weeks.  Lynder Parents MD, DFAPA  Please see After Visit Summary for patient specific instructions.    Future Appointments  Date Time Provider [place]  09/09/2020  5:00 PM Shanon Ace, LCSW [facility] None  09/17/2020 10:00 AM Shanon Ace, LCSW [facility] None  09/24/2020 11:00 AM Shanon Ace, LCSW [facility] None  10/01/2020  10:00 AM Shanon Ace, LCSW [facility] None  10/08/2020 10:00 AM Shanon Ace, LCSW [facility] None  10/15/2020 10:00 AM Shanon Ace, LCSW [facility] None  10/22/2020 10:00 AM Shanon Ace, LCSW [facility] None  10/28/2020 10:00 AM Shanon Ace, LCSW [facility] None    No orders of the defined types were placed in this encounter.     -------------------------------

## 2020-09-07 NOTE — Telephone Encounter (Signed)
Megan Whitney called after her session because she forgot to let you know she needed a refill of her Focalin.  Please send to [facility] in [place].

## 2020-09-09 ENCOUNTER — Ambulatory Visit (INDEPENDENT_AMBULATORY_CARE_PROVIDER_SITE_OTHER): Payer: Medicare Other | Admitting: Psychiatry

## 2020-09-09 ENCOUNTER — Other Ambulatory Visit: Payer: Self-pay

## 2020-09-09 DIAGNOSIS — F319 Bipolar disorder, unspecified: Secondary | ICD-10-CM

## 2020-09-09 NOTE — Progress Notes (Signed)
[facility] Counselor/Therapist Progress Note  Patient ID: SHAKYA SEBRING, MRN: [id number],    Date: 09/09/2020  Time Spent:  50 minutes      5:00pm to 5:50pm  Treatment Type: Individual Therapy  Reported Symptoms: Anxiety/depression, fearfulness, anger, tearfulness, sometimes not as focused   Mental Status Exam:  Appearance:   Casual     Behavior:  Appropriate, Sharing and Motivated  Motor:  Normal  Speech/Language:   Clear and Coherent  Affect:  depressed, anxious  Mood:  angry, anxious, depressed, irritable and sad  Thought process:  hard to stay focused at times; overstressed  Thought content:    some obsessiveness  Sensory/Perceptual disturbances:    WNL  Orientation:  oriented to person, place, time/date, situation, day of week, month of year and year  Attention:  Good  Concentration:  Fair  Memory:  forgetfulness and worse under stress  Fund of knowledge:   Good  Insight:    Fair  Judgment:   Fair  Impulse Control:  Good   Risk Assessment: Danger to Self:  No Self-injurious Behavior: No Danger to Others: No Duty to Warn:no Physical Aggression / Violence:No  Access to Firearms a concern: No  Gang Involvement:No   Subjective: Patient reporting anxiety, depression, tearfulness, and reports lashing out at times.  Fearful re: husband's cancer. Denies any SI.   Interventions: Solution-Oriented/Positive Psychology, Ego-Supportive and Grief Therapy  Diagnosis:   ICD-10-CM   1. Bipolar I disorder with rapid cycling ([place])  F31.9     Plan: Patient not signing tx plan on computer screen due to [place].  Treatment Goals: Goals remain on plan as patient works on strategies to meet her goals. Progress is noted each visit in "Progress" section of goal plan.  Long Term Goal: Reduce overall level, frequency, and intensity of the anxiety so that daily functioning is not impaired.  Short Term Goal: 1.Increase understanding of the beliefs and messages that  produce the worry and anxiety.  Strategies: 1.Help client develop reality-based positive cognitive messages/self-talk. 2. Develop a "coping card" or other reminder which coping strategies are recorded for patient's later use .  PROGRESS: Patient in today reporting anxiety, depression, anger, fearfulness, and sometimes less focused. Feeling overwhelmed, and hard to rest. Sometimes feels empty and I cry. Denies any SI.  Processed in session a lot of her current thoughts and feelings about her situation with husband's cancer.  Fears things won't go well but we acknowledged that his chemo treatments right now are going well per oncologist's report.  Patient was able to cite a couple other things regarding their current situation that are more positive due to the help from other people that care about the.  Is making a conscientious effort to refocus more on some positives versus imagining the worst case scenarios.  Has some good support from a long-term friend and also from some people in their church.  Encouraged her own self-care so that she is able to do what she needs to do at home and with her husband.  Patient has tended to see self-care as being "selfish" and we talked about the difference between selfishness and providing self-care.  Continues to be very upset, understandably, that her husband has begun over drinking again.  Reports that she still worries a lot about what is going to happen next and will she be able to handle it.  Encouraged patient to work really hard on staying in the present, and we discussed some examples in session  tonight of what exactly that means literally day by day.  She seemed to better grasp that concept and shared ways that she might be able to do this more successfully.  Encouraging her to remain in contact with people that are supportive of her, to practice positive and affirming self talk, to practice overall positive self-care, to stop being judgmental with herself, to  get out some each day, to use journaling and other strategies discussed previously to help with her anxiety and depression, to intentionally stay in the present without assuming worse case scenarios, and let her faith also be of emotional support.  States she is going to continue to take notes on her phone between sessions as this helps her stay focused.  Goal review and progress/challenges noted with patient.  Next appointment within 2 weeks.   Shanon Ace, LCSW

## 2020-09-11 ENCOUNTER — Telehealth: Payer: Self-pay | Admitting: Psychiatry

## 2020-09-11 NOTE — Telephone Encounter (Signed)
Pt called and asked if a pa could be done on her vraylar and equetro. She has medicire and we have her new information. She needs to know if medicare will cover these meds and if they don't she will need to change to a different medicare appointment. Please contact her either way [phone]

## 2020-09-15 NOTE — Telephone Encounter (Signed)
Prior authorization submitted for Vraylar, this medication is on her plan but it's a high tier of 5 and can not be lowered. Prior authorization for [facility] sent, clinical questions answered over the phone and will fax or call back a determination.

## 2020-09-17 ENCOUNTER — Other Ambulatory Visit: Payer: Self-pay

## 2020-09-17 ENCOUNTER — Telehealth: Payer: Self-pay | Admitting: Psychiatry

## 2020-09-17 ENCOUNTER — Ambulatory Visit (INDEPENDENT_AMBULATORY_CARE_PROVIDER_SITE_OTHER): Payer: Medicare Other | Admitting: Psychiatry

## 2020-09-17 DIAGNOSIS — F411 Generalized anxiety disorder: Secondary | ICD-10-CM

## 2020-09-17 NOTE — Progress Notes (Signed)
[facility] Counselor/Therapist Progress Note  Patient ID: Megan Whitney, MRN: [id number],    Date: 09/17/2020  Time Spent: 10:00am to 10:50am  Treatment Type: Individual Therapy  Reported Symptoms: "anxiety main symptom today", depression, fearfulness, low motivation   Mental Status Exam:  Appearance:   Neat     Behavior:  Appropriate and Sharing  Motor:  Normal  Speech/Language:   Clear and Coherent  Affect:  anxious  Mood:  anxious, depressed and sad  Thought process:  goal directed  Thought content:    some tangentiality  Sensory/Perceptual disturbances:    WNL  Orientation:  oriented to person, place, time/date, situation, day of week, month of year and year  Attention:  Good  Concentration:  Good and Fair  Memory:  some forgetting especially under stress  Fund of knowledge:   Good  Insight:    Good and Fair  Judgment:   Good  Impulse Control:  Good   Risk Assessment: Danger to Self:  No Self-injurious Behavior: No Danger to Others: No Duty to Warn:no Physical Aggression / Violence:No  Access to Firearms a concern: No  Gang Involvement:No   Subjective: Patient today reporting anxiety as her main symptom, along with depression, some sadness, and fearfulness of future, and lower motivation.  Had prior SI but not within the past week and states that she would never harm herself because she "knows what it would only hurt other family members."  Interventions: Solution-Oriented/Positive Psychology and Ego-Supportive  Diagnosis:   ICD-10-CM   1. Generalized anxiety disorder  F41.1     Plan: Patient not signing tx plan on computer screen due to [place].  Treatment Goals: Goals remain on plan as patient works on strategies to meet her goals. Progress is noted each visit in "Progress" section of goal plan.  Long Term Goal: Reduce overall level, frequency, and intensity of the anxiety so that daily functioning is not impaired.  Short Term  Goal: 1.Increase understanding of the beliefs and messages that produce the worry and anxiety.  Strategies: 1.Help client develop reality-based positive cognitive messages/self-talk. 2. Develop a "coping card" or other reminder which coping strategies are recorded for patient's later use .  PROGRESS: Patient in today reporting anxiety as her stronger symptom. Also having depression, fear of future, and decline in motivation.  Is hoping "predicted snow/ice storm ends up being a non-event", as she is worried about "how bad it might be."  States they have groceries and have a generator in case of power outage so is glad for that.  Is stressed and feels overwhelmed at times but is also showing some strength in taking her husband to appointments and helping out in other ways.  She is also in touch with supportive friends and works well in appointments here she is concerned today about one of her medications that there may be a problem with Medicare and is checking with her doctor on that.  Tends to be fearful of the future and "what might go wrong" and I gently encouraged her to recognize her automatic thoughts going in a negative and more fearful direction, helping her to look more for what might go right or "maybe it will not end up as bad as I think", and notes that that has happened several times before where things went in a more positive direction than she was expecting.  Thinks that her overwhelmed this is what contributes to her decreased motivation and she is going to work on her limits  as far as to how much she can get done at a particular time, being more lenient and when she expects to have something completed.  Also worked today on staying in the present rather than excessively worrying about the future, which understandably can be difficult for her right now, but she admits "this is what I really need to do".  Continues to stay in touch with people who are supportive of her including a long-term  friend that she speaks with very frequently, and also church members who are supportive.  States that she is trying to be more self caring emotionally and physically.  Frustrated with husband that he is consuming more alcohol again.  Shares that she still worries about what is going to happen in the future but does try to pull herself back into the present at times when she recognizes it.  Working with her long-term, short-term, and strategies within her treatment plan above to better manage her anxiety, understanding she is going through a very stressful and uncertain time with her husband's health and chemo.  Encouraged to use journaling as discussed in session to help with some of her anxiety and depression, to intentionally stay more in the present and not assume worst case scenarios, stay in contact with people that are supportive of her, practice affirming and positive self talk, let go of the being judgmental with herself, to get outside some each day if weather permits, and to let her faith also be a source of strength for her.   Goal review and progress/challenges noted with patient.  Next appointment within 2 weeks.   Shanon Ace, LCSW

## 2020-09-17 NOTE — Telephone Encounter (Signed)
Pt was in for a therapy appointmnent and wanted you to know that even with a good rx vraylar is $1400. So she needs something that is generic and similar to vraylar. Please give her a call at [phone]

## 2020-09-17 NOTE — Telephone Encounter (Signed)
As I told her at the last visit, there is nothing similar to Vraylar that is generic.  That is why I suggested she select an insurance plan that would cover it..  Reduce Vraylar that she has remaining to 1 every 3rd day until she runs out.  She may feel OK for awhile without it bc it gets out of the body slowly.  We'll see how she's doing at her visit next month

## 2020-09-18 ENCOUNTER — Other Ambulatory Visit: Payer: Self-pay | Admitting: Psychiatry

## 2020-09-18 DIAGNOSIS — H04123 Dry eye syndrome of bilateral lacrimal glands: Secondary | ICD-10-CM | POA: Diagnosis not present

## 2020-09-18 MED ORDER — DEXMETHYLPHENIDATE HCL 10 MG PO TABS: 10.0000 mg | ORAL_TABLET | Freq: Two times a day (BID) | ORAL | 0 refills | Status: DC

## 2020-09-18 NOTE — Telephone Encounter (Signed)
FYI, the issue isn't her Radio [employer], it's a Tier 5 and they will not reduce it which puts it in a higher cost for her.

## 2020-09-18 NOTE — Telephone Encounter (Signed)
Since she has retired 10 mg BID should be OK.  I will send in a RX for this.  Thanks

## 2020-09-18 NOTE — Telephone Encounter (Signed)
Rtc to patient and she has approximately 2 weeks worth of medication, advised her to take every 3rd day. She verbalized understanding. Her next apt is 10/19/20 and can be reassessed at that point.

## 2020-09-18 NOTE — Telephone Encounter (Signed)
Spoke with patient and she also reports her insurance won't cover Focalin XR 20 mg daily but they do cover 10 mg bid. Patient will be due for refill in next 2 weeks. I can try to submit a PA first if you would prefer she stay on the 20 mg daily dosing.  Just let me know.

## 2020-09-24 ENCOUNTER — Ambulatory Visit: Payer: BC Managed Care – PPO | Admitting: Psychiatry

## 2020-09-25 ENCOUNTER — Telehealth: Payer: Self-pay | Admitting: Psychiatry

## 2020-09-25 NOTE — Telephone Encounter (Signed)
Megan Whitney's next appt is 10/19/20. Since she has been tapering off the Vraylar, she is having more depression, disorganized thinking and lack of motivation. She asked if she could have something else prescribed to her. Pharmacy is [facility] in [place]. Phone # is [phone].

## 2020-10-01 ENCOUNTER — Telehealth: Payer: Self-pay

## 2020-10-01 ENCOUNTER — Other Ambulatory Visit: Payer: Self-pay

## 2020-10-01 ENCOUNTER — Ambulatory Visit (INDEPENDENT_AMBULATORY_CARE_PROVIDER_SITE_OTHER): Payer: Medicare Other | Admitting: Psychiatry

## 2020-10-01 DIAGNOSIS — F411 Generalized anxiety disorder: Secondary | ICD-10-CM

## 2020-10-01 NOTE — Telephone Encounter (Signed)
Patient was at apt with her counselor and requested some Vraylar. Previous message indicated patient having trouble with taper. Advised staff to give her samples of Vraylar 1.5 mg until apt on 10/19/20 to discuss with Dr. Clovis Pu.

## 2020-10-01 NOTE — Progress Notes (Signed)
[facility] Counselor/Therapist Progress Note  Patient ID: Megan Whitney, MRN: [id number],    Date: 10/01/2020  Time Spent: 50 minutes   10:00am to 10:50am  Treatment Type: Individual Therapy  Reported Symptoms: anxiety, depression"with anxiety being some stronger"  Mental Status Exam:  Appearance:   Well Groomed     Behavior:  Appropriate, Sharing and Motivated  Motor:  Normal  Speech/Language:   Clear and Coherent  Affect:  anxious, depressed  Mood:  anxious, depressed and some tearfulness  Thought process:  goal directed  Thought content:    some rumination  Sensory/Perceptual disturbances:    WNL  Orientation:  oriented to person, place, time/date, situation, day of week, month of year and year  Attention:  Fair  Concentration:  Fair  Memory:  forgetful and worse under stress  Fund of knowledge:   Good  Insight:    Fair  Judgment:   Fair  Impulse Control:  Fair   Risk Assessment: Danger to Self:  denies any current SI but has had prior thoughts within week but denies any plans Self-injurious Behavior: No Danger to Others: No Duty to Warn:no Physical Aggression / Violence:No  Access to Firearms a concern: No  Gang Involvement:No   Subjective: Patient today reporting anxiety and depression, with anxiety being some stronger.   Interventions: Cognitive Behavioral Therapy and Solution-Oriented/Positive Psychology  Diagnosis:   ICD-10-CM   1. Generalized anxiety disorder  F41.1      Plan: Patient not signing tx plan on computer screen due to [place].  Treatment Goals: Goals remain on plan as patient works on strategies to meet her goals. Progress is noted each visit in "Progress" section of goal plan.  Long Term Goal: Reduce overall level, frequency, and intensity of the anxiety so that daily functioning is not impaired.  Short Term Goal: 1.Increase understanding of the beliefs and messages that produce the worry and  anxiety.  Strategies: 1.Help client develop reality-based positive cognitive messages/self-talk. 2. Develop a "coping card" or other reminder which coping strategies are recorded for patient's later use .  PROGRESS: Patient today reporting anxiety and depression with anxiety being the stronger symptom. "Having meltdowns frequently and feeling isolated. Is working on letting others know more how they can help. Focused today primarily on her" getting off my own back" and stopping being so self-critical, stopping her negative self-talk, trying to stop assuming worst case scenarios when they really don't have much current info as to what her husband's reaction to chemo will be yet.  States  "It's hard to prioritize what's important with so much going on." My follow-through is not good, I'm depressed and worry about everything." As we talked, she was calmer and more grounded, took lots of notes on her phone, less tearful, and more willing to reach out to other friends and neighbors who could be supportive also.  Work hard on staying in the present as she easily gets caught up in fearing the future.  Did some good work on this today in session and encouraged her to continue between sessions.  Is trying to take care of herself emotionally and physically.  Continues work with her long and short-term goals and strategies within her treatment plan to better manage anxiety, uncertainty, and except the fact that she is going through a very stressful and uncertain time but without assuming the worst.  She uses journaling at times to help express herself between sessions and I encouraged her to continue this as she  stays more intentionally in the present, practices more positive self talk, enjoying time playing with her dogs, stops being so judgmental with herself, getting outside some each day if weather permits, letting her faith be a source of strength for her emotionally, and staying in contact with people that are  supportive of her and her husband.  Review and progress/challenges noted with patient.  Next appointment within 1- 2 weeks.   Shanon Ace, LCSW

## 2020-10-01 NOTE — Telephone Encounter (Signed)
Pt got some samples.  However she was warned before switch to Medicare to make sure plan adequately covered Vraylar.   She didn't do this.   We tried all reasonable alternatives to Vraylar which either failed or caused intolerable SE.  I  cannot fix this problem for her.  She will inevitably worsen when she stops an effective tolerated med.

## 2020-10-06 ENCOUNTER — Other Ambulatory Visit: Payer: Self-pay | Admitting: Psychiatry

## 2020-10-06 DIAGNOSIS — F319 Bipolar disorder, unspecified: Secondary | ICD-10-CM

## 2020-10-07 ENCOUNTER — Other Ambulatory Visit: Payer: Self-pay | Admitting: Psychiatry

## 2020-10-07 ENCOUNTER — Telehealth: Payer: Self-pay | Admitting: Psychiatry

## 2020-10-07 ENCOUNTER — Ambulatory Visit: Payer: BC Managed Care – PPO | Admitting: Internal Medicine

## 2020-10-07 ENCOUNTER — Encounter: Payer: Self-pay | Admitting: Internal Medicine

## 2020-10-07 ENCOUNTER — Other Ambulatory Visit: Payer: Self-pay

## 2020-10-07 VITALS — BP 130/83 | HR 91 | Temp 99.0°F | Resp 18 | Ht 60.0 in | Wt 156.1 lb

## 2020-10-07 DIAGNOSIS — F319 Bipolar disorder, unspecified: Secondary | ICD-10-CM | POA: Diagnosis not present

## 2020-10-07 DIAGNOSIS — G4730 Sleep apnea, unspecified: Secondary | ICD-10-CM | POA: Diagnosis not present

## 2020-10-07 DIAGNOSIS — N952 Postmenopausal atrophic vaginitis: Secondary | ICD-10-CM

## 2020-10-07 DIAGNOSIS — Z7689 Persons encountering health services in other specified circumstances: Secondary | ICD-10-CM

## 2020-10-07 DIAGNOSIS — E7849 Other hyperlipidemia: Secondary | ICD-10-CM | POA: Diagnosis not present

## 2020-10-07 DIAGNOSIS — M8580 Other specified disorders of bone density and structure, unspecified site: Secondary | ICD-10-CM | POA: Insufficient documentation

## 2020-10-07 DIAGNOSIS — K219 Gastro-esophageal reflux disease without esophagitis: Secondary | ICD-10-CM

## 2020-10-07 DIAGNOSIS — E7800 Pure hypercholesterolemia, unspecified: Secondary | ICD-10-CM | POA: Insufficient documentation

## 2020-10-07 DIAGNOSIS — J302 Other seasonal allergic rhinitis: Secondary | ICD-10-CM

## 2020-10-07 DIAGNOSIS — R269 Unspecified abnormalities of gait and mobility: Secondary | ICD-10-CM

## 2020-10-07 HISTORY — DX: Postmenopausal atrophic vaginitis: N95.2

## 2020-10-07 MED ORDER — CETIRIZINE HCL 10 MG PO TABS: 10.0000 mg | ORAL_TABLET | Freq: Every day | ORAL | 3 refills | Status: DC

## 2020-10-07 MED ORDER — LOXAPINE SUCCINATE 25 MG PO CAPS: 50.0000 mg | ORAL_CAPSULE | Freq: Every day | ORAL | 1 refills | Status: DC

## 2020-10-07 MED ORDER — ATORVASTATIN CALCIUM 20 MG PO TABS: 20.0000 mg | ORAL_TABLET | Freq: Every day | ORAL | 0 refills | Status: DC

## 2020-10-07 MED ORDER — PANTOPRAZOLE SODIUM 40 MG PO TBEC: 40.0000 mg | DELAYED_RELEASE_TABLET | Freq: Every day | ORAL | 2 refills | Status: DC

## 2020-10-07 NOTE — Patient Instructions (Addendum)
You are being referred to Neurology for sleep apnea evaluation and gait imbalance.  Please continue to take medications as prescribed.  Please get fasting blood tests done before the next visit.

## 2020-10-07 NOTE — Assessment & Plan Note (Signed)
On Pantoprazole 

## 2020-10-07 NOTE — Assessment & Plan Note (Addendum)
Having difficulty breathing and snoring at nighttime Nonfunctioning CPAP device Unsure when she had sleep study Referred to Neurology for OSA evaluation

## 2020-10-07 NOTE — Assessment & Plan Note (Signed)
Care established Previous chart reviewed History and medications reviewed with the patient 

## 2020-10-07 NOTE — Progress Notes (Signed)
New Patient Office Visit  Subjective:  Patient ID: Megan Whitney, female    DOB: July 18, 1956  Age: 65 y.o. MRN: [id number]  CC:  Chief Complaint  Patient presents with  . New Patient (Initial Visit)    New patient former [facility] pt she feels like she is having a hard time breathing at night and wakes up not sleeping good     HPI Megan Whitney is a 65 year old female with PMH of bipolar disorder, ADD, anxiety, OSA, GERD, HLD and osteopenia who presents for establishing care.  Sleep apnea: She has a CPAP device, but has not been functioning properly. She tried to get it fixed in the past, but has not been successful. She reports insomnia and daytime fatigue. She wakes up every 2 hours to catch a breath. Her partner has noticed her snoring as well. She has not had sleep study for many years.  Bipolar disorder: She is on multiple medications for bipolar disorder and ADD. She follows up with Psychiatrist and has a therapist, who she visits every week.  She was diagnosed with osteopenia in the DEXA scan. Has been on Vitamin D supplements.  Last colonoscopy in 09/2018. Last Mammography in 06/2020.  She is up-to-date with COVID vaccine and flu vaccine. She has had PCV13 and Shingrix vaccine.  Past Medical History:  Diagnosis Date  . ADD (attention deficit disorder)   . Allergy    Seasonal  . Anemia    History of GI blood loss  . Anxiety   . Arthritis   . Bipolar 1 disorder ([place])   . Cancer ([place])   . Colon polyps   . Depression   . Diabetes mellitus ([place])   . Edema, lower extremity   . Epistaxis    Around 2011 or 2012, required cauterization.   . Esophageal stricture   . Fracture of superior pubic ramus ([place]) 11/28/2018  . GERD (gastroesophageal reflux disease)   . Headache(784.0)   . Hyperlipidemia   . Interstitial cystitis   . Joint pain   . Lactose intolerance   . Lung cancer ([place]) 2002  . Obesity   . Osteoarthritis   . Palpitations   . Sleep apnea     Doesn't use a CPAP  . Swallowing difficulty     Past Surgical History:  Procedure Laterality Date  . BALLOON DILATION  05/16/2012   Procedure: BALLOON DILATION;  Surgeon: Inda Castle, MD;  Location: [facility];  Service: Endoscopy;  Laterality: N/A;  . BUNIONECTOMY  2011  . COLONOSCOPY    . ENTEROSCOPY  05/16/2012   Procedure: ENTEROSCOPY;  Surgeon: Inda Castle, MD;  Location: [facility];  Service: Endoscopy;  Laterality: N/A;  . JOINT REPLACEMENT    . right shoulder durgery 25 yrs ago  1988  . TOTAL HIP ARTHROPLASTY  2006, 2008   bilateral  . TUBAL LIGATION  1990  . WEDGE RESECTION  2002   lung cancer    Family History  Problem Relation Age of Onset  . Arthritis Mother   . Hearing loss Mother   . Hyperlipidemia Mother   . Hypertension Mother   . Depression Mother   . Anxiety disorder Mother   . Obesity Mother   . Sudden death Mother   . Hypertension Father   . Diabetes Mellitus II Father   . Heart disease Father   . Arthritis Father   . Cancer Father        Brain  . COPD Father   .  Diabetes Father   . Hyperlipidemia Father   . Sleep apnea Father   . Early death Sister        Aneroxia/Bulimic  . Depression Brother   . Early death Investment banker, corporate  . Depression Daughter   . Drug abuse Daughter   . Heart disease Daughter   . Hypertension Daughter   . Stroke Maternal Grandmother   . Hypertension Maternal Grandmother   . Arthritis Maternal Grandfather   . Heart attack Maternal Grandfather   . Hearing loss Maternal Grandfather   . Colon cancer Neg Hx   . Esophageal cancer Neg Hx   . Rectal cancer Neg Hx     Social History   Socioeconomic History  . Marital status: Married    Spouse name: Not on file  . Number of children: 1  . Years of education: Not on file  . Highest education level: Not on file  Occupational History  . Occupation: Admin. assistant  Tobacco Use  . Smoking status: Never Smoker  . Smokeless tobacco: Never Used   Vaping Use  . Vaping Use: Never used  Substance and Sexual Activity  . Alcohol use: Yes    Alcohol/week: 1.0 standard drink    Types: 1 Glasses of wine per week    Comment: Moderate  . Drug use: No  . Sexual activity: Yes  Other Topics Concern  . Not on file  Social History Narrative   Pt lives in [place] with husband Lanny Hurst.  Followed by Dr. Clovis Pu for psychiatry and Rinaldo Cloud for therapy.   Social Determinants of Health   Financial Resource Strain: Not on file  Food Insecurity: Not on file  Transportation Needs: Not on file  Physical Activity: Not on file  Stress: Not on file  Social Connections: Not on file  Intimate Partner Violence: Not on file    ROS Review of Systems  Constitutional: Negative for chills and fever.  HENT: Negative for congestion, sinus pressure, sinus pain and sore throat.   Eyes: Negative for pain and discharge.  Respiratory: Positive for shortness of breath (At nighttime). Negative for cough.   Cardiovascular: Negative for chest pain and palpitations.  Gastrointestinal: Negative for abdominal pain, constipation, diarrhea, nausea and vomiting.  Endocrine: Negative for polydipsia and polyuria.  Genitourinary: Negative for dysuria and hematuria.  Musculoskeletal: Negative for neck pain and neck stiffness.  Skin: Negative for rash.  Neurological: Negative for dizziness and weakness.  Psychiatric/Behavioral: Negative for agitation, sleep disturbance and suicidal ideas. The patient is nervous/anxious.     Objective:   Today's Vitals: BP 130/83 (BP Location: Right Arm, Patient Position: Sitting, Cuff Size: Normal)   Pulse 91   Temp 99 F (37.2 C) (Oral)   Resp 18   Ht 5' (1.524 m)   Wt 156 lb 1.9 oz (70.8 kg)   SpO2 95%   BMI 30.49 kg/m   Physical Exam Vitals reviewed.  Constitutional:      General: She is not in acute distress.    Appearance: She is not diaphoretic.  HENT:     Head: Normocephalic and atraumatic.     Nose: Nose  normal.     Mouth/Throat:     Mouth: Mucous membranes are moist.  Eyes:     General: No scleral icterus.    Extraocular Movements: Extraocular movements intact.     Pupils: Pupils are equal, round, and reactive to light.  Cardiovascular:     Rate and Rhythm:  Normal rate and regular rhythm.     Pulses: Normal pulses.     Heart sounds: Normal heart sounds. No murmur heard.   Pulmonary:     Breath sounds: Normal breath sounds. No wheezing or rales.  Abdominal:     Palpations: Abdomen is soft.     Tenderness: There is no abdominal tenderness.  Musculoskeletal:     Cervical back: Neck supple. No tenderness.     Right lower leg: No edema.     Left lower leg: No edema.  Skin:    General: Skin is warm.     Findings: No rash.  Neurological:     General: No focal deficit present.     Mental Status: She is alert and oriented to person, place, and time.     Sensory: No sensory deficit.     Motor: No weakness.     Gait: Gait abnormal.     Comments: Resting tremor of right hand  Psychiatric:        Behavior: Behavior normal.        Thought Content: Thought content normal.     Assessment & Plan:   Problem List Items Addressed This Visit      Encounter to establish care - Primary   Care established Previous chart reviewed History and medications reviewed with the patient     Relevant Orders  CBC with Differential  CMP14+EGFR  Hemoglobin A1c  TSH  Vitamin D (25 hydroxy)    Respiratory   Sleep apnea    Having difficulty breathing and snoring at nighttime Nonfunctioning CPAP device Unsure when she had sleep study Referred to Neurology for OSA evaluation      Relevant Orders   CMP14+EGFR   TSH   Ambulatory referral to Neurology     Digestive   GERD (gastroesophageal reflux disease)    On Pantoprazole      Relevant Medications   docusate sodium (COLACE) 100 MG capsule   pantoprazole (PROTONIX) 40 MG tablet     Musculoskeletal and Integument   Osteopenia     Advised to take Calcium and Vitamin D supplements        Other   Bipolar disorder (HCC)    On Lithium, Vraylar, Tegretol, Loxatine, Equetro and Focalin Follows up with Psychiatry      Hyperlipidemia    On Atorvastatin F/u lipid profile      Relevant Medications   atorvastatin (LIPITOR) 20 MG tablet   Other Relevant Orders   Lipid panel      Seasonal allergies    Well-controlled with Zyrtec      Relevant Medications   cetirizine (ZYRTEC) 10 MG tablet   Gait disturbance    Gait imbalance Could be due to polypharmacy Referred to Neurology for further evaluation of gait imbalance and resting tremors      Relevant Orders   Ambulatory referral to Neurology      Outpatient Encounter Medications as of 10/07/2020  Medication Sig  . carbamazepine (TEGRETOL) 100 MG chewable tablet CHEW 1 TABLET BY MOUTH AT BEDTIME.  . cariprazine (VRAYLAR) capsule Take 1 capsule (1.5 mg total) by mouth daily.  . cholecalciferol (VITAMIN D) 25 MCG (1000 UNIT) tablet Take 1,000 Units by mouth daily.  Marland Kitchen dexmethylphenidate (FOCALIN XR) 20 MG 24 hr capsule Take 20 mg by mouth daily.  Marland Kitchen docusate sodium (COLACE) 100 MG capsule Take 100 mg by mouth 2 (two) times daily.  Marland Kitchen EQUETRO 200 MG CP12 12 hr capsule Take 1 capsule (200 mg  total) by mouth at bedtime.  . ferrous gluconate (FERGON) 324 MG tablet Take 324 mg by mouth daily with breakfast.  . Ferrous Sulfate (IRON) 325 (65 Fe) MG TABS 1 tablet  . lamoTRIgine (LAMICTAL) 200 MG tablet TAKE (1) TABLET BY MOUTH TWICE DAILY.  Marland Kitchen lithium carbonate 150 MG capsule Take 1 capsule (150 mg total) by mouth daily in the afternoon.  . [DISCONTINUED] atorvastatin (LIPITOR) 20 MG tablet Take 1 tablet (20 mg total) by mouth at bedtime.  . [DISCONTINUED] cetirizine (ZYRTEC) 10 MG tablet Take 1 tablet (10 mg total) by mouth daily.  . [DISCONTINUED] dexmethylphenidate (FOCALIN) 10 MG tablet Take 1 tablet (10 mg total) by mouth 2 (two) times daily.  . [DISCONTINUED]  loxapine (LOXITANE) 25 MG capsule Take 1 capsule (25 mg total) by mouth 2 (two) times daily.  . [DISCONTINUED] pantoprazole (PROTONIX) 40 MG tablet Take 1 tablet (40 mg total) by mouth daily.  Marland Kitchen atorvastatin (LIPITOR) 20 MG tablet Take 1 tablet (20 mg total) by mouth at bedtime.  . cetirizine (ZYRTEC) 10 MG tablet Take 1 tablet (10 mg total) by mouth daily.  . pantoprazole (PROTONIX) 40 MG tablet Take 1 tablet (40 mg total) by mouth daily.  . [DISCONTINUED] pramipexole (MIRAPEX) 0.5 MG tablet Take 1 tablet (0.5 mg total) by mouth in the morning and at bedtime. (Patient not taking: Reported on 10/07/2020)  . [DISCONTINUED] sulfamethoxazole-trimethoprim (BACTRIM DS) 800-160 MG tablet Take 1 tablet by mouth 2 (two) times daily.   No facility-administered encounter medications on file as of 10/07/2020.    Follow-up: Return in about 4 months (around 02/04/2021) for Annual physical.   Lindell Spar, MD

## 2020-10-07 NOTE — Telephone Encounter (Signed)
Pt called and said that the pharmacy told her that you denied her loxapine 25 mg .Please send a refill to [facility] in [place]

## 2020-10-07 NOTE — Assessment & Plan Note (Signed)
On Atorvastatin F/u lipid profile

## 2020-10-07 NOTE — Assessment & Plan Note (Signed)
Well-controlled with Zyrtec

## 2020-10-07 NOTE — Telephone Encounter (Signed)
Sent RX

## 2020-10-07 NOTE — Assessment & Plan Note (Signed)
Advised to take Calcium and Vitamin D supplements

## 2020-10-07 NOTE — Assessment & Plan Note (Signed)
On Lithium, Vraylar, Tegretol, Loxatine, Equetro and Focalin Follows up with Psychiatry

## 2020-10-07 NOTE — Assessment & Plan Note (Signed)
Gait imbalance Could be due to polypharmacy Referred to Neurology for further evaluation of gait imbalance and resting tremors

## 2020-10-08 ENCOUNTER — Ambulatory Visit (INDEPENDENT_AMBULATORY_CARE_PROVIDER_SITE_OTHER): Payer: Medicare Other | Admitting: Psychiatry

## 2020-10-08 ENCOUNTER — Other Ambulatory Visit: Payer: Self-pay | Admitting: Psychiatry

## 2020-10-08 ENCOUNTER — Telehealth: Payer: Self-pay

## 2020-10-08 DIAGNOSIS — F319 Bipolar disorder, unspecified: Secondary | ICD-10-CM

## 2020-10-08 MED ORDER — CHLORDIAZEPOXIDE HCL 25 MG PO CAPS: 25.0000 mg | ORAL_CAPSULE | Freq: Every evening | ORAL | 0 refills | Status: DC | PRN

## 2020-10-08 NOTE — Addendum Note (Signed)
Addended byShanon Ace on: 10/08/2020 02:29 PM   Modules accepted: Level of Service

## 2020-10-08 NOTE — Telephone Encounter (Signed)
Patient was checking out today after a visit with her counselor and asked to speak to nurse. Patient reports trouble sleeping, having lots of anxiety at night. Husband has Cancer. She reports sleeping 2 hours then waking up with anxiety all throughout the night. Patient decided to take one of her husband's medication for sleep which was librium and she reports she is sleeping through the night. Asking for a Rx for that. Informed her that is an older medication that Dr. Clovis Pu doesn't normally prescribe but would discuss with him options.   Patient does have f/u apt on 10/19/20

## 2020-10-08 NOTE — Telephone Encounter (Signed)
Left message to call back  

## 2020-10-08 NOTE — Telephone Encounter (Signed)
Patient called back and she reports the Librium is 25 mg, informed her I would let Dr. Clovis Pu know so he could send a Rx.  Also she was questioning the Adderall that was at the pharmacy since 09/18/2020. After looking back through messages due to the Focalin XR bid being too expensive it appeared Dr. Clovis Pu changed it to Adderall. She reports she will check with her insurance and make sure on formulary. I reassured her it should be. She  reports she will try it and see how she does.

## 2020-10-08 NOTE — Telephone Encounter (Signed)
OK HS dosing of librium temporarily.  Ask her what dose she's taking.  She can take on at night only and don't go higher on the dose than that.

## 2020-10-08 NOTE — Telephone Encounter (Signed)
Librium 25 mg hs sent

## 2020-10-08 NOTE — Progress Notes (Addendum)
[facility] Counselor/Therapist Progress Note  Patient ID: Megan Whitney, MRN: [id number],    Date: 10/08/2020  Time Spent: 60 minutes   10:00am to 11:00am   Treatment Type: Individual Therapy  Reported Symptoms: anxiety, depression, frustration, lots of negative self-doubting thoughts  Mental Status Exam:  Appearance:   Casual     Behavior:  Appropriate, Sharing and Motivated  Motor:  Normal  Speech/Language:   Clear and Coherent  Affect:  anxious, depressed  Mood:  anxious and depressed  Thought process:  goal directed  Thought content:    some obsessiveness  Sensory/Perceptual disturbances:    WNL  Orientation:  oriented to person, place, time/date, situation, day of week, month of year and year  Attention:  Good  Concentration:  Fair  Memory:  some forgetting and worse under stress  Fund of knowledge:   Good  Insight:    Fair  Judgment:   Good and Fair  Impulse Control:  Good   Risk Assessment: Danger to Self:  Denies any current SI.  Most recent was 3-4 days and "without a plan"". Self-injurious Behavior: No Danger to Others: No Duty to Warn:no Physical Aggression / Violence:No  Access to Firearms a concern: No  Gang Involvement:No   Subjective: Patient today reporting anxiety, depression.  Interventions: Cognitive Behavioral Therapy and Solution-Oriented/Positive Psychology  Diagnosis:   ICD-10-CM   1. Bipolar I disorder with rapid cycling ([place])  F31.9      Plan: Patient not signing tx plan on computer screen due to [place].  Treatment Goals: Goals remain on plan as patient works on strategies to meet her goals. Progress is noted each visit in "Progress" section of goal plan.  Long Term Goal: Reduce overall level, frequency, and intensity of the anxiety so that daily functioning is not impaired.  Short Term Goal: 1.Increase understanding of the beliefs and messages that produce the worry and anxiety.  Strategies: 1.Help client develop  reality-based positive cognitive messages/self-talk. 2. Develop a "coping card" or other reminder which coping strategies are recorded for patient's later use .  PROGRESS: Patient in today reporting anxiety, depression, some prior SI 3 days ago, did not have any plan.  Has not had any SI since that time.  Tearful, lack of belief in herself to cope, difficulties dealing with uncertainties, hard to be organized and know what to do next, follow through is hard, and most thought are self-defeating. Worked in session today with her short term goal and strategies in her tx plan above to help guide patient towards working to decrease her "worst case scenario" thoughts, along with her self-negating thoughts. She also made written list of some affirmations that can help her during the times she struggles more. Did well with this and seemed to feel more grounded and not tearful upon leaving.  Discussed her medicine not being available through [facility] plans and she says that she can make a change in her plan up to some point in March.  She is going to be in touch with her Medicare rep and ask him about any Medicare coverage that would include helping pay for Vraylar.  Between sessions she is encouraged to continue replacing her "worst case scenario thinking" with more realistic thinking that does not automatically assume the worst.  Also encouraged her to get out some each day and walk as she is able, stop being so self-critical and negative in her self talk, good nutrition, staying in the present and focusing on what she  can control or change versus what she cannot, use journaling as a resource since that has helped some previously, reading as she has stated that that is helpful also, letting her faith be a source of strength for her emotionally, and staying in touch with people that are supportive of her.  Goal review and progress/challenges noted with patient.  Next appointment within 2 weeks.   Shanon Ace,  LCSW

## 2020-10-09 ENCOUNTER — Encounter: Payer: Self-pay | Admitting: Internal Medicine

## 2020-10-09 ENCOUNTER — Telehealth: Payer: Self-pay | Admitting: *Deleted

## 2020-10-09 NOTE — Telephone Encounter (Signed)
Calcium 600 mg. Caltrate 600 + D3 would be appropriate for her.

## 2020-10-09 NOTE — Telephone Encounter (Signed)
Pt is wondering what the results of her lab work was. Please advise

## 2020-10-09 NOTE — Telephone Encounter (Signed)
I don't see any recent blood work.

## 2020-10-09 NOTE — Telephone Encounter (Signed)
Pt advised with verbal understandign

## 2020-10-09 NOTE — Telephone Encounter (Signed)
Pt would like to know how much calcium you would like her to take it was advised to talk calcium at yesterdays visit please advise

## 2020-10-12 ENCOUNTER — Other Ambulatory Visit: Payer: Self-pay | Admitting: Psychiatry

## 2020-10-12 DIAGNOSIS — F319 Bipolar disorder, unspecified: Secondary | ICD-10-CM

## 2020-10-15 ENCOUNTER — Ambulatory Visit (INDEPENDENT_AMBULATORY_CARE_PROVIDER_SITE_OTHER): Payer: Medicare Other | Admitting: Psychiatry

## 2020-10-15 ENCOUNTER — Other Ambulatory Visit: Payer: Self-pay

## 2020-10-15 DIAGNOSIS — F319 Bipolar disorder, unspecified: Secondary | ICD-10-CM | POA: Diagnosis not present

## 2020-10-15 NOTE — Progress Notes (Signed)
[facility] Counselor/Therapist Progress Note  Patient ID: Megan Whitney, MRN: [id number],    Date: 10/15/2020  Time Spent: 60 minutes   10:00am to 11:00am   Treatment Type: Individual Therapy  Reported Symptoms: anxiety, depression worse, stressed, fearful thinking  Mental Status Exam:  Appearance:   Neat     Behavior:  Appropriate and Sharing  Motor:  Normal  Speech/Language:   Clear and Coherent  Affect:  anxious, depressed, fearful  Mood:  anxious, depressed and fearful, stressed  Thought process:  goal directed  Thought content:    some obsessiveness  Sensory/Perceptual disturbances:    WNL  Orientation:  oriented to person, place, time/date, situation, day of week, month of year and year  Attention:  Good  Concentration:  Fair  Memory:  forgetting and worse under stress  Fund of knowledge:   Good  Insight:    Good and Fair  Judgment:   Good and Fair  Impulse Control:  Good   Risk Assessment: Danger to Self:  No Self-injurious Behavior: No Danger to Others: No Duty to Warn:no Physical Aggression / Violence:No  Access to Firearms a concern: No  Gang Involvement:No   Subjective: Patient today reports depression is some worse but no SI. Also reporting anxiety, stressed, and fearful thinking. Does feel that she is managing as best as she can.  Following through on some suggestions from prior sessions and feels that helps.  Interventions: Cognitive Behavioral Therapy, Solution-Oriented/Positive Psychology and Ego-Supportive  Diagnosis:   ICD-10-CM   1. Bipolar I disorder with rapid cycling ([place])  F31.9     Plan: Patient not signing tx plan on computer screen due to [place].  Treatment Goals: Goals remain on plan as patient works on strategies to meet her goals. Progress is noted each visit in "Progress" section of goal plan.  Long Term Goal: Reduce overall level, frequency, and intensity of the anxiety so that daily functioning is not  impaired.  Short Term Goal: 1.Increase understanding of the beliefs and messages that produce the worry and anxiety.  Strategies: 1.Help client develop reality-based positive cognitive messages/self-talk. 2. Develop a "coping card" or other reminder which coping strategies are recorded for patient's later use .  PROGRESS: Patient in today reporting depression is some worse due to husband's cancer and they recently got CT and bone scan results back.  CT scan was "neutral with no positive nor negative changes".  Bone scans show"some diminishing of tumors and some areas had become more involved." "This added to our depression after getting these results, some better today."  Did add that husband's psychiatrist now has him on medication that "lessens his desire for alcohol" so he has recently stopped drinking again. Patient is following up from discussion last session, stating that she is believing in herself more, and is working towards getting through situations one step at the time. Financial issues and husband's relatives are helping out some. Denies SI but depression has been some worse. States it does help to talk through her fears and anxieties but hard "to keep it at [place] when they may get a discouraging report from husbands' appts.  Worked more today with her short germ goal and strategies in tx plan above, helping with her anxious/negative/self-defeating thoughts and trying to replace with more realistic and empowering thoughts.  Patient encouraged to continue with strategies discussed today and replacing her anxious and sometimes worse case scenario thinking with more realistic thoughts that do not automatically assume the worst.  Did however acknowledge that she is in the midst of a scary situation with her husband's health and is having to deal with a lot of uncertainty.  Also encouraged her to get outside some each day when it is not real cold, to work on further decreasing her self  criticalness, staying in the present and focusing on what she can impact or control, continue using journaling as a helpful resource with the many thoughts that she is having, staying in touch with people that are supportive of her, and letting her faith help strengthen her emotionally during this very stressful time time.  Goal review and progress/challenges noted with patient.  Next appt within 2 weeks.   Shanon Ace, LCSW

## 2020-10-19 ENCOUNTER — Ambulatory Visit (INDEPENDENT_AMBULATORY_CARE_PROVIDER_SITE_OTHER): Payer: Medicare Other | Admitting: Psychiatry

## 2020-10-19 ENCOUNTER — Encounter: Payer: Self-pay | Admitting: Psychiatry

## 2020-10-19 ENCOUNTER — Other Ambulatory Visit: Payer: Self-pay

## 2020-10-19 DIAGNOSIS — R7989 Other specified abnormal findings of blood chemistry: Secondary | ICD-10-CM

## 2020-10-19 DIAGNOSIS — F411 Generalized anxiety disorder: Secondary | ICD-10-CM

## 2020-10-19 DIAGNOSIS — F314 Bipolar disorder, current episode depressed, severe, without psychotic features: Secondary | ICD-10-CM | POA: Diagnosis not present

## 2020-10-19 DIAGNOSIS — F900 Attention-deficit hyperactivity disorder, predominantly inattentive type: Secondary | ICD-10-CM

## 2020-10-19 DIAGNOSIS — R251 Tremor, unspecified: Secondary | ICD-10-CM

## 2020-10-19 DIAGNOSIS — G3184 Mild cognitive impairment, so stated: Secondary | ICD-10-CM

## 2020-10-19 MED ORDER — DEXMETHYLPHENIDATE HCL 10 MG PO TABS: 15.0000 mg | ORAL_TABLET | Freq: Two times a day (BID) | ORAL | 0 refills | Status: DC

## 2020-10-19 NOTE — Progress Notes (Signed)
KEYDI [name] [id number] December 11, 1955 65 y.o.   Video Visit via My Chart  I connected with pt by My Chart and verified that I am speaking with the correct person using two identifiers.   I discussed the limitations, risks, security and privacy concerns of performing an evaluation and management service by My Chart  and the availability of in person appointments. I also discussed with the patient that there may be a patient responsible charge related to this service. The patient expressed understanding and agreed to proceed.  I discussed the assessment and treatment plan with the patient. The patient was provided an opportunity to ask questions and all were answered. The patient agreed with the plan and demonstrated an understanding of the instructions.   The patient was advised to call back or seek an in-person evaluation if the symptoms worsen or if the condition fails to improve as anticipated.  I provided 30 minutes of video time during this encounter.  The patient was located at home and the provider was located office. Session started at 10 and ended at 10:30 AM  Subjective:   Patient ID:  Megan Whitney is a 65 y.o. (DOB 1956-05-16) female.   Chief Complaint:  Chief Complaint  Patient presents with  . Follow-up  . Bipolar I disorder with rapid cycling ([place])  . Medication Problem    Cost & SE  . Anxiety  . Depression    Depression        Associated symptoms include decreased concentration.  Associated symptoms include no suicidal ideas.  Past medical history includes anxiety.   Anxiety Symptoms include decreased concentration and nervous/anxious behavior. Patient reports no chest pain, confusion, dizziness, nausea, palpitations or suicidal ideas.    Medication Refill Associated symptoms include arthralgias. Pertinent negatives include no chest pain, nausea or weakness.   lSusan SYLVI RYBOLT is  follow-up of r chronic mood swings and anxiety and frequent changes in  medications.   At visit December 27, 2018.  Focalin XR was increased from 20 mg to 25 mg daily to help with focus and attention and potentially mood.  When seen February 13, 2019.  In an effort to reduce mood cycling we reduce fluoxetine to 20 mg daily.  At visit August 2020.  No meds were changed.  She continued the following: Focalin XR 25 mg every morning and Focalin 10 mg immediate release daily Equetro 200 mg nightly Fluoxetine 20 mg daily Lamotrigine 200 mg twice daily Lithium 150 mg nightly Vraylar 3 mg daily  She called back November 4 after seeing her therapist stating that she was having some hypomanic symptoms with reduced sleep and increased energy.  This potentiality had been discussed and the decision was made to increase Equetro from 200 mg nightly to 300 mg nightly.  seen August 12, 2019.  Because of balance problems she did not tolerate Equetro 300 mg nightly and it was changed to Equetro 200 mg nightly plus immediate release carbamazepine 100 mg nightly.  Her mood had not been stable enough on Equetro 200 mg nightly alone. Less balance problems with change in CBZ.  seen September 23, 2019.  The following was changed: For bipolar mixed increase CBZ IR to 200 mg HS.  Disc fall and balance risks.For bipolar mixed increase CBZ IR to 200 mg HS.  Disc fall and balance risks.  She called back October 23, 2019 stating she had had another fall and felt it was due to the medication.  Therefore carbamazepine immediate release was  reduced from 200 mg nightly to 100 mg nightly.  The Equetro is unchanged.  Last seen November 04, 2019.  The following was noted:  Better at the moment but balance is still somewhat of a problem.  Started PT to help balance.  Had a fall after tripping on a curb and hit her head on sidewalk.  Got a concussion with nausea and HA and dizziness and light sensitivity.  Not over it.  Concentration problems.  Has gotten back to work after a week.   Mood sx pretty good with some  mild depression.  Nothing severe.  Trying to minimize stress and self care as much as possible.  No manic sx lately and sleeping fairly well.  No racing thoughts.   Working another year and plans to retire but H alcoholic and not sure it will be good to be there all the time. Seeing therapist q 2 weeks.  Therapy helping . Recent serum vitamin D level was determined to be low at 33.  The goal and chronically depressed patient's is in the 50s if possible.  So her vitamin D was increased on August 08, 2018 or thereabouts.  Checked vitamin D level again and this time it was high at 120 and so it was stopped.  She's restarted per PCP at 1000 units daily.  01/06/2020 appointment the following is noted: Still on: Focalin XR 25 mg every morning and Focalin 10 mg immediate release daily Equetro 200 mg nightly Carbamazepine immediate release 100 mg nightly Fluoxetine 20 mg daily Lamotrigine 200 mg twice daily Lithium 150 mg nightly Vraylar 3 mg daily Not good manic.  Angry.  Missed 2 days bc sx.  Last week vacation which didn't go well.  Crying last week and missed a day.  "Pissed off at the whole world" but also depressed and hard to get OOB today.  Everything makes me angry.   Blows up without control.  Then regrets it.  Sleep irregular lately. Finished PT which might have helped some but still balance problems. Plan: Cannot increase carbamazepine due to balance issues Temporarily Ativan for agitation 0.5 mg tablets  DT mania stop fluoxetine If fails trial loxapine  01/15/2020 patient called after hours with suicidal thoughts and patient was to go to the [facility]. Patient ultimately admitted to [facility] psychiatric unit.  Dr. Clovis Pu spoke with clinical pharmacist they are giving history of medication experience and recommendation for loxapine.  Patient hospital stay for 3 days and discharged on loxapine 10 mg nightly as the new medication.  02/10/2020 phone call  patient complaining of insomnia.  Loxapine was increased from 10 to 20 mg nightly due to recent insomnia with mania.  02/14/2020 appointment with the following noted: Lately in tears Monday and Tuesday convinced she couldn't do her job.  Better last couple of days.  Motivation is not real good but not depressed like Monday and Tuesday. This week missing some meds bc couldn't get like Focalin.  Been taking other meds. No sig manic sx.  Sleep is better with more loxapine about 8 hours. Anxiety is chronic.  No SE loxapine so far unless a little dizzy here and there. No med changes.  02/25/2020 appointment urgently made after patient was recently hospitalized.  The following is noted: Unstable.  Today manic driving erratically.  Talking a mile a minute.  Not thinking clearly.  Angry.  Slept OK last night.  Hyperactive with poor productivity for a couple of days.  Weekend  OK overall.   No falls lately. More tremor lately.  Retiring July 30.  Plan: For tremor amantadine 100 mg twice a day if needed. Increase loxapine to 3 capsules 1 to 2 hours before bedtime Reduce Vraylar to 1.5 mg daily or 3 mg every other day.   04/01/2020 appointment with the following noted: Amantadine hs caused NM. Low grade depression for a couple of weeks.  Not severe.   Extended work date 06/04/20 to retire date.  She feels OK about it in some ways but doesn't feel fully up to it.  Doesn't remember when hypomania resolved from last visit.   Sleep is much better now uninterrupted. Hard to remember lithium at lunch. Still has tremor but better with amantadine.  Anxiety still through the roof. Plan: Increase loxapine 40 mg HS.  05/04/20 appt with the following noted:  Increased loxapine to 40.  Anxiety no better.  All kinds of reasons including worry about retirement and paying for things, but worry is probably exaggerated and H say sit is. Sleep good usually.  No SE noted.  Not making her sleep more with change. Still  some manic sx including shortly after last visit and then depressed until the last week.  Irritable and angry. Some panic with SOB and fear of MI. Plan: Continue Vraylar 1.5 mg every day (conisder reduction) Increase loxapine to 50 mg daily for 1 week and if no improvement then increase to 75 mg each night (or 3 of the 25 mg capsules)  Multiple phone calls between appointments with the patient complaining loxapine was causing insomnia.  She has adjusted on timing and dose as she felt it was necessary to make it tolerable because when she takes it in the morning she gets sleepy if she takes very much.  06/09/20 appt Noted: Max tolerated loxapine 25 mg BID.  More than that HS gives strange dreams and difficult to go back to sleep and more in AM too sedated. Not doing well.  Anxiety through the roof.  Did ok with vacation but home worries about everything.   Retired.  Has a lot of time to generally worry.  Started reading again for the first time in awhile.  That's helpful. Takes a while to adjust to retirement.  Anxiety and depression feed each other.  Less interest in some activities.  Later in afternoon is not quite as anxious. Hard to drive with anxiety.   Plan: Reduce to see if it helps reduce anxiety.  Focalin XR 20 mg every morning  and stop Focalin 10 mg immediate release daily Equetro 200 mg nightly Carbamazepine immediate release 100 mg nightly Lamotrigine 200 mg twice daily Lithium 150 mg nightly Continue Vraylar 1.5 mg every day (conisder reduction) continue loxapine to 25 mg BID for longer trial.  07/07/20 appt with the following noted: Tearful and overwhelmed  By [facility] dx of prostate CA with mets bones and nodes with plans for hormone tx and radiation and chemotherapy.  Found out about 3 weeks ago.   He's in sig pain and she's caregiving.  Hard for him to walk even on walker.  Is falling to pieces but realizes it's typical but bc bipolar may be affecting her harder.  Tearful a lot.   Forgetting things, distracted, personal routine disrupted. She still feels the focalin is helpful.  Poor sleep last night bc H but usually 7-8 hours. No effect noticed from Amantadine for tremor. CO more depressed. Plan: Option treat tremor.  change amanatadine 100 mg AM  to pramipexole to try to help tremor and mood off label.  Disc risk mania.  She wants to do it..  07/14/2020 phone call:Sue called to report that she will be starting Medicare as of January, 2022.  She will be on regular medicare A&B and prescription plan D.  Her Vraylar and [place] Mc will NOT be covered by medicare.  She needs to know if there are other medications to replace these.  The cost for these medications is over $6000 and she can't afford that price.  She has an appt 12/2, but needs to know asap if there are going to be alternate medications and what they are so she can check on coverage. MD response: There are no reasonable alternatives to these medications that will work in the same way.  She needs to get a better Medicare D plan that will cover the Vraylar and Equetro or her psychiatric symptoms will get worse if she stops these medications.  There are better Medicare D plans that we will cover these medicines but obviously those plans are more expensive but I can have no control over that.  08/06/2020 appointment with the following noted: Tremor no better and maybe worse with switch from to pramipexole 0.125 mg BID from Amantadine.  No SE. Depressed and anxious and crying a lot.  Hard to tell if related to H.  Anxiety definitely related to H.  H can't do very much bc pain and on pain meds and anemic.  Transfusion yesterday.  H can't drive or shop.  Too weak.  Says she can't find a medicare plan that will cover Equetro and [employer]. Plan: She wants to continue 10 mg immediate release Focalin daily but try skipping to see if anxiety is better. Increase pramipexole to try to help tremor and mood off label.  Disc risk mania.   She wants to do it.. Increase to 0.5 mg BID.  09/07/2020 appointment with the following noted: At last appointment patient was more depressed and anxious and complaining of tremor.  Additional stress with husband's cancer and poor health. Severe anger problems with 0.5 mg BID and mood swings on pramipexole after a week.  Reduced to 0.5 mg AM and still having the problem. Helped tremor tremdously at the higher dose and worse with lower dose.  Tremor same all day except worse with stress.   Stopped Focalin IR without change. Things have been tough and dealing with depression.  H's cancer really affecting me.  Causing depression and anxiety and often in tears.  Able to care for herself and H.  He doesn't require a lot of care but she's not strong emotionally.   Now on [facility] and worry over med coverage. Plan: So wean and stop it loxapine due to NR and intolerance of higher dose.    09/11/2020 phone call that new Medicare plan would not cover Focalin XR and it was switched to Focalin 10 mg twice daily.  Also informed of high cost of Vraylar with new plan. MD response: As I told her at the last visit, there is nothing similar to Vraylar that is generic.  That is why I suggested she select an insurance plan that would cover it.. Reduce Vraylar that she has remaining to 1 every 3rd day until she runs out.  She may feel OK for awhile without it bc it gets out of the body slowly. We'll see how she's doing at her visit next month   09/25/2020 phone call from patient saying she was  more depressed since tapering off the Vraylar including disorganized thinking and lack of motivation. MD response: Pt got some samples.  However she was warned before switch to Medicare to make sure plan adequately covered Vraylar.   She didn't do this.   We tried all reasonable alternatives to Vraylar which either failed or caused intolerable SE.  I  cannot fix this problem for her.  She will inevitably worsen when she stops an effective  tolerated med.  10/07/2020 patient called back stating she wanted to restart loxapine.  10/19/2020 appointment with the following noted: Says none of Medicare D plans cover Vraylar except with high copay of $400/month. Won't be able to stay on it but is taking some of the Vraylar now.   Currently on Vraylar 1.5 mg daily but that won't last and she'll have to stop it.  Has cut back and feels more depressed markedly. She decided the loxapine was helping some and wanted to restart loxapine 25 mg in AM.  Makes her sleepy.    Paying $90 monthly for Equetro. But had balance probles with CBZ ER. Wasn't taking lithium for a long while and restarted 150 mg HS. Wants to stay on librium 25 mg HS bc it helps sleep but insurance won't pay for it either.   Past Psychiatric Medication Trials: Vraylar 4.5 SE mouth movements reduced to 3 mg 3/20. It was effective at lower doses for depression.  Worse off it. lithium 150,   Trileptal 450, Depakote, Equetro 300 balance issues, CBZ ER falling,  Lamictal 200 twice daily, Latuda 80, , olanzapine, Seroquel, risperidone, Abilify, loxapine 25 mg BID (max tolerated) Focalin,  Ritalin,  fluoxetine 60,  buspirone, sertraline 100, Wellbutrin history of facial tics, paroxetine cognitive side effects ropinirole, amantadine, Sinemet, Artane, Cogentin, pramipexole 0.5 mg BID helped tremor but caused anger trazodone hangover, Ambien hangover,  Review of Systems:  Review of Systems  HENT: Positive for tinnitus.        Chirping cricket sounds in hears since January  Cardiovascular: Negative for chest pain and palpitations.  Gastrointestinal: Negative for nausea.  Musculoskeletal: Positive for arthralgias. Negative for gait problem.  Neurological: Positive for tremors. Negative for dizziness, seizures, syncope, weakness and light-headedness.       Still balance problems. No falls lately. Tremor is worse in hands  Psychiatric/Behavioral: Positive for decreased  concentration, depression and dysphoric mood. Negative for agitation, behavioral problems, confusion, hallucinations, self-injury, sleep disturbance and suicidal ideas. The patient is nervous/anxious. The patient is not hyperactive.   No falls since here. Not currently depressed but unable to remove this from the list.  Medications: I have reviewed the patient's current medications.  Current Outpatient Medications  Medication Sig Dispense Refill  . atorvastatin (LIPITOR) 20 MG tablet Take 1 tablet (20 mg total) by mouth at bedtime. 90 tablet 0  . carbamazepine (TEGRETOL) 100 MG chewable tablet CHEW 1 TABLET BY MOUTH AT BEDTIME. 90 tablet 0  . cariprazine (VRAYLAR) capsule Take 1 capsule (1.5 mg total) by mouth daily. 30 capsule 1  . cetirizine (ZYRTEC) 10 MG tablet Take 1 tablet (10 mg total) by mouth daily. 90 tablet 3  . chlordiazePOXIDE (LIBRIUM) 25 MG capsule Take 1 capsule (25 mg total) by mouth at bedtime as needed for anxiety. 30 capsule 0  . cholecalciferol (VITAMIN D) 25 MCG (1000 UNIT) tablet Take 1,000 Units by mouth daily.    Marland Kitchen dexmethylphenidate (FOCALIN) 10 MG tablet Take 1.5 tablets (15 mg total) by mouth 2 (two) times daily. [place]  tablet 0  . docusate sodium (COLACE) 100 MG capsule Take 100 mg by mouth 2 (two) times daily.    Marland Kitchen EQUETRO 200 MG CP12 12 hr capsule Take 1 capsule (200 mg total) by mouth at bedtime. 30 capsule 5  . Ferrous Sulfate (IRON) 325 (65 Fe) MG TABS 1 tablet    . lamoTRIgine (LAMICTAL) 200 MG tablet TAKE (1) TABLET BY MOUTH TWICE DAILY. 180 tablet 0  . lithium carbonate 150 MG capsule Take 1 capsule (150 mg total) by mouth daily in the afternoon. 90 capsule 1  . loxapine (LOXITANE) 25 MG capsule Take 2 capsules (50 mg total) by mouth at bedtime. 60 capsule 1  . pantoprazole (PROTONIX) 40 MG tablet Take 1 tablet (40 mg total) by mouth daily. 90 tablet 2  . ferrous gluconate (FERGON) 324 MG tablet Take 324 mg by mouth daily with breakfast. (Patient not taking:  Reported on 10/19/2020)     No current facility-administered medications for this visit.    Medication Side Effects: Other: tremor and weight gain.  Dyskinesia appears better  SE bettter than they were.  Balance problems intermittently  Allergies:  Allergies  Allergen Reactions  . Azithromycin Anaphylaxis  . Penicillins Anaphylaxis    DID THE REACTION INVOLVE: Swelling of the face/tongue/throat, SOB, or low BP? Yes Sudden or severe rash/hives, skin peeling, or the inside of the mouth or nose? Yes Did it require medical treatment? No When did it last happen? If all above answers are "NO", may proceed with cephalosporin use.  . Adhesive [Tape] Other (See Comments)    On bandaids    Past Medical History:  Diagnosis Date  . ADD (attention deficit disorder)   . Allergy    Seasonal  . Anemia    History of GI blood loss  . Anxiety   . Arthritis   . Bipolar 1 disorder ([place])   . Cancer ([place])   . Colon polyps   . Depression   . Diabetes mellitus ([place])   . Edema, lower extremity   . Epistaxis    Around 2011 or 2012, required cauterization.   . Esophageal stricture   . Fracture of superior pubic ramus ([place]) 11/28/2018  . GERD (gastroesophageal reflux disease)   . Headache(784.0)   . Hyperlipidemia   . Interstitial cystitis   . Joint pain   . Lactose intolerance   . Lung cancer ([place]) 2002  . Obesity   . Osteoarthritis   . Palpitations   . Sleep apnea    Doesn't use a CPAP  . Swallowing difficulty     Family History  Problem Relation Age of Onset  . Arthritis Mother   . Hearing loss Mother   . Hyperlipidemia Mother   . Hypertension Mother   . Depression Mother   . Anxiety disorder Mother   . Obesity Mother   . Sudden death Mother   . Hypertension Father   . Diabetes Mellitus II Father   . Heart disease Father   . Arthritis Father   . Cancer Father        Brain  . COPD Father   . Diabetes Father   . Hyperlipidemia Father   . Sleep apnea Father   . Early  death Sister        Aneroxia/Bulimic  . Depression Brother   . Early death Investment banker, corporate  . Depression Daughter   . Drug abuse Daughter   . Heart disease Daughter   .  Hypertension Daughter   . Stroke Maternal Grandmother   . Hypertension Maternal Grandmother   . Arthritis Maternal Grandfather   . Heart attack Maternal Grandfather   . Hearing loss Maternal Grandfather   . Colon cancer Neg Hx   . Esophageal cancer Neg Hx   . Rectal cancer Neg Hx     Social History   Socioeconomic History  . Marital status: Married    Spouse name: Not on file  . Number of children: 1  . Years of education: Not on file  . Highest education level: Not on file  Occupational History  . Occupation: Admin. assistant  Tobacco Use  . Smoking status: Never Smoker  . Smokeless tobacco: Never Used  Vaping Use  . Vaping Use: Never used  Substance and Sexual Activity  . Alcohol use: Yes    Alcohol/week: 1.0 standard drink    Types: 1 Glasses of wine per week    Comment: Moderate  . Drug use: No  . Sexual activity: Yes  Other Topics Concern  . Not on file  Social History Narrative   Pt lives in [place] with husband Lanny Hurst.  Followed by Dr. Clovis Pu for psychiatry and Rinaldo Cloud for therapy.   Social Determinants of Health   Financial Resource Strain: Not on file  Food Insecurity: Not on file  Transportation Needs: Not on file  Physical Activity: Not on file  Stress: Not on file  Social Connections: Not on file  Intimate Partner Violence: Not on file    Past Medical History, Surgical history, Social history, and Family history were reviewed and updated as appropriate.   Please see review of systems for further details on the patient's review from today.   Objective:   Physical Exam:  There were no vitals taken for this visit.  Physical Exam Neurological:     Mental Status: She is alert and oriented to person, place, and time.     Cranial Nerves: No dysarthria.   Psychiatric:        Attention and Perception: Attention and perception normal.        Mood and Affect: Mood is anxious and depressed. Affect is not tearful.        Speech: Speech normal.        Behavior: Behavior is cooperative.        Thought Content: Thought content normal. Thought content is not paranoid or delusional. Thought content does not include homicidal or suicidal ideation. Thought content does not include homicidal or suicidal plan.        Cognition and Memory: Cognition and memory normal.        Judgment: Judgment normal.     Comments: Insight intact More depressed with cryiing     Lab Review:     Component Value Date/Time   NA 139 03/02/2020 1433   K 4.5 03/02/2020 1433   CL 103 03/02/2020 1433   CO2 22 03/02/2020 1433   GLUCOSE 83 03/02/2020 1433   GLUCOSE 102 (H) 01/19/2020 1158   BUN 15 03/02/2020 1433   CREATININE 0.76 03/02/2020 1433   CALCIUM 9.4 03/02/2020 1433   PROT 6.8 07/22/2020 1338   PROT 6.4 03/02/2020 1433   ALBUMIN 4.3 07/22/2020 1338   ALBUMIN 4.3 03/02/2020 1433   AST 15 07/22/2020 1338   ALT 10 07/22/2020 1338   ALKPHOS 67 07/22/2020 1338   BILITOT 0.4 07/22/2020 1338   BILITOT 0.2 03/02/2020 1433   GFRNONAA 83 03/02/2020 1433   GFRAA 96 03/02/2020 1433  Component Value Date/Time   WBC 6.0 03/02/2020 1433   WBC 7.4 01/19/2020 1158   RBC 4.39 03/02/2020 1433   RBC 4.19 01/19/2020 1158   HGB 13.7 03/02/2020 1433   HCT 40.4 03/02/2020 1433   PLT 219 03/02/2020 1433   MCV 92 03/02/2020 1433   MCH 31.2 03/02/2020 1433   MCH 30.3 01/19/2020 1158   MCHC 33.9 03/02/2020 1433   MCHC 32.1 01/19/2020 1158   RDW 12.6 03/02/2020 1433   LYMPHSABS 1.6 03/02/2020 1433   MONOABS 0.7 04/04/2019 1004   EOSABS 0.2 03/02/2020 1433   BASOSABS 0.0 03/02/2020 1433  Vitamin D level 33 on 10K units daily on 12/4/`9 Increased to prescription vitamin d 50K units Monday, Wed, Friday.  Rx sent in.   Lithium Lvl  Date Value Ref Range Status   10/21/2018 0.18 (L) 0.60 - 1.20 mmol/L Final    Comment:    Performed at [facility], [address] [place], [place] [address]     No results found for: PHENYTOIN, PHENOBARB, VALPROATE, CBMZ   .res Assessment: Plan:    Bipolar affective disorder, depressed, severe ([place]) - Plan: dexmethylphenidate (FOCALIN) 10 MG tablet  Generalized anxiety disorder  Attention deficit hyperactivity disorder (ADHD), predominantly inattentive type  Low vitamin D level  Mild cognitive impairment  Tremor of both hands  Greater than 50% of 30 min face to face time with patient was spent on counseling and coordination of care. We discussed the following: Gwendlyon has chronic rapid cycling bipolar disorder which is chronically unstable and has been difficult to control.  The rapid cycling is making it difficult to control frequency of depressive episodes and the anxiety as well.  We have typically had to make frequent med changes.  We have had to reduce mood stabilizer dosages including Vraylar and Equetro she is very sensitive to carbamazepine because higher doses cause dizziness.     More depressed with less Vraylar.  No easy solution to this.  She's tried all FDA approved meds for bipolar depression except Caplyta which she can't afford either.  Failed many other meds too. Disc ECT.  She decided the loxapine helped some and wants to stay on 25 mg but needs to switch to night bc sleepy from it.  Reduction in  Vraylar to 1.5 mg  Will likely lead to worsening mood sx but brands are unaffordable for her with MCR.    Since last year when she was stable she started having hypomanic symptoms again.  We increased Equetro back to 300 mg nightly but she she has had balance problems. It had stopped the manic symptoms but then she started having balance problems again and we had to switch it back to Equetro 300 and add carbamazepine immediate release 100 nightly..  Out of work ADD less of a problem.  Discussed the  risk of stimulants including that that could contribute to mood instability and mood swings.    She has a high residual anxiety.  It has been impossible to control all of her symptoms simultaneously without causing side effects.  She agrees.  Continue the following Discussed side effects of each medicine. Switch   Focalin XR 20 mg  To IR 15 mg BID DT Cost and off label for depression   Equetro 200 mg nightly Carbamazepine immediate release 100 mg nightly Lamotrigine 200 mg twice daily Lithium 150 mg nightly Continue Vraylar 1.5 mg every other  day until she runs out.    If cannot get some meds  covered will have marked relapse risks DT multiple other med failures and intolerances.  Discussed potential metabolic side effects associated with atypical antipsychotics, as well as potential risk for movement side effects. Advised pt to contact office if movement side effects occur.  She may be having some mild TD with toe movement.   pramipexole stopped bc anger at 0.5 mg BID and poor response for tremor at lower dose.  It was also being used off label for depression but ideal dose was not achieved.  PCP sending her to neuro for tremor, balance and gait shuffling.  Call and inform us about this appt.  Disc SE meds and this is heightened by the complication of necessary polypharmacy.  Supportive therapy in terms of dealing with husband's addiction and now new dx metastatic prostate CA.Marland Kitchen  She's interested in Bipolar Support Group and info given.  Requires frequent FU DT chronic instability.  FU 4 weeks.  Lynder Parents MD, DFAPA  Please see After Visit Summary for patient specific instructions.    Future Appointments  Date Time Provider [place]  10/22/2020 10:00 AM Shanon Ace, LCSW [facility] None  10/28/2020 10:00 AM Shanon Ace, LCSW [facility] None  11/04/2020  9:00 AM Shanon Ace, LCSW [facility] None  11/12/2020 10:00 AM Shanon Ace, LCSW [facility] None  11/17/2020 11:00 AM Cottle,  Billey Co., MD [facility] None  11/19/2020  2:00 PM Shanon Ace, LCSW [facility] None  11/27/2020 10:00 AM Shanon Ace, LCSW [facility] None  02/04/2021  1:00 PM Lindell Spar, MD [facility]    No orders of the defined types were placed in this encounter.     -------------------------------

## 2020-10-19 NOTE — Patient Instructions (Addendum)
Continue Vraylar 1.5 mg every other  day until she runs out.  Switch   Focalin XR 20 mg  To IR 15 mg twice daily

## 2020-10-21 ENCOUNTER — Telehealth: Payer: Self-pay

## 2020-10-21 NOTE — Telephone Encounter (Signed)
[facility] called regarding clinical questions for patient's medication CHLORDIAZEPOXIDE 25 MG CAPS, clinical questions answered over the phone and sent to pharmacist for review.They will respond back in 24-48 hours with a response.

## 2020-10-22 ENCOUNTER — Ambulatory Visit (INDEPENDENT_AMBULATORY_CARE_PROVIDER_SITE_OTHER): Payer: Medicare Other | Admitting: Psychiatry

## 2020-10-22 ENCOUNTER — Other Ambulatory Visit: Payer: Self-pay

## 2020-10-22 DIAGNOSIS — F314 Bipolar disorder, current episode depressed, severe, without psychotic features: Secondary | ICD-10-CM

## 2020-10-22 NOTE — Progress Notes (Signed)
[facility] Counselor/Therapist Progress Note  Patient ID: MERRY POND, MRN: [id number],    Date: 10/22/2020  Time Spent: 60 minutes   10:00am to 11:00am  Treatment Type: Individual Therapy  Reported Symptoms: Anxiety, depression, low motivation, fearfulness  Mental Status Exam:  Appearance:   Casual     Behavior:  Appropriate, Sharing and Motivated  Motor:  Normal  Speech/Language:   Clear and Coherent  Affect:  anxious, depressed, fearful  Mood:  anxious, depressed and fearful  Thought process:  goal directed  Thought content:    some obsessiveness  Sensory/Perceptual disturbances:    WNL  Orientation:  oriented to person, place, time/date, situation, day of week, month of year and year  Attention:  Fair  Concentration:  Fair  Memory:  WNL  Fund of knowledge:   Good  Insight:    Good and Fair  Judgment:   Good and Fair  Impulse Control:  Good, Fair   Risk Assessment: Danger to Self:  No Self-injurious Behavior: No Danger to Others: No Duty to Warn:no Physical Aggression / Violence:No  Access to Firearms a concern: No  Gang Involvement:No   Subjective: Patient today reporting anxiety, depression, fearfulness, and some lowered motivation.  Interventions: Solution-Oriented/Positive Psychology and Ego-Supportive  Diagnosis:   ICD-10-CM   1. Bipolar affective disorder, depressed, severe ([place])  F31.4     Plan: Patient not signing tx plan on computer screen due to [place].  Treatment Goals: Goals remain on plan as patient works on strategies to meet her goals. Progress is noted each visit in "Progress" section of goal plan.  Long Term Goal: Reduce overall level, frequency, and intensity of the anxiety so that daily functioning is not impaired.  Short Term Goal: 1.Increase understanding of the beliefs and messages that produce the worry and anxiety.  Strategies: 1.Help client develop reality-based positive cognitive messages/self-talk. 2.  Develop a "coping card" or other reminder which coping strategies are recorded for patient's later use .  PROGRESS: Patient in today reporting anxiety, depression, lowered motivation, and fearfulness. Husband to have last chemo treatment soon. Latest word from oncologist is encouraging. Right now husband not drinking at all, per meds his psychiatrist prescribed for him. Patient still showing more strength at times that she seems to realize, but also have significant anxiety and depression at times most related to personal issues and husband's medical concerns. Worked more today with patient believing more in herself, not judging herself harshly, and trying to take it one day at a time and pace herself. Tends to look for more negatives than positives, and often imagining worst case scenarios, and we worked on this more today, with patient showing some better understanding of how focusing on the negatives only, will keep her from enjoying the better or more hopeful times such as recently getting better medical reports on husband. Has reached out to more people recently and that is helping also.  Encouraged to get outside some each day as weather permits, spend time with her 2 dogs, stopping the automatic assumption of worst case scenarios, decreasing self criticalness, continue reading and writing in her journal that her friend gave her and is personalized with positive quotes or scripture, staying in the present and focusing on what she can control versus cannot, stay in touch with people that are supportive of her, letting her faith be a source of emotional strength as well as spiritual, and practice interrupting anxious thoughts and replacing with more reality based and affirming thoughts.  Goal review and progress/challenges noted with patient.  Next appointment within 2 weeks.   Shanon Ace, LCSW

## 2020-10-22 NOTE — Telephone Encounter (Signed)
These questions were answered over the phone and pending response

## 2020-10-24 ENCOUNTER — Other Ambulatory Visit: Payer: Self-pay | Admitting: Psychiatry

## 2020-10-24 DIAGNOSIS — F319 Bipolar disorder, unspecified: Secondary | ICD-10-CM

## 2020-10-28 ENCOUNTER — Ambulatory Visit: Payer: BC Managed Care – PPO | Admitting: Psychiatry

## 2020-10-30 ENCOUNTER — Other Ambulatory Visit: Payer: Self-pay | Admitting: Psychiatry

## 2020-10-30 DIAGNOSIS — F319 Bipolar disorder, unspecified: Secondary | ICD-10-CM

## 2020-11-04 ENCOUNTER — Ambulatory Visit (INDEPENDENT_AMBULATORY_CARE_PROVIDER_SITE_OTHER): Payer: Medicare Other | Admitting: Psychiatry

## 2020-11-04 ENCOUNTER — Other Ambulatory Visit: Payer: Self-pay

## 2020-11-04 DIAGNOSIS — F411 Generalized anxiety disorder: Secondary | ICD-10-CM | POA: Diagnosis not present

## 2020-11-04 NOTE — Progress Notes (Signed)
[facility] Counselor/Therapist Progress Note  Patient ID: Megan Whitney, MRN: [id number],    Date: 11/04/2020  Time Spent: 60 minutes    9:00am to 10:00am      Treatment Type: Individual Therapy  Reported Symptoms: anxiety, depression   Mental Status Exam:  Appearance:   Casual     Behavior:  Appropriate, Sharing and Motivated  Motor:  Normal  Speech/Language:   Clear and Coherent  Affect:  anxious  Mood:  anxious and depressed  Thought process:  goal directed  Thought content:    WNL  Sensory/Perceptual disturbances:    WNL  Orientation:  oriented to person, place, time/date, situation, day of week, month of year and year  Attention:  Good  Concentration:  Fair  Memory:  some forgetting at times, worse with stress  Fund of knowledge:   Good  Insight:    Good and Fair  Judgment:   Good  Impulse Control:  Fair   Risk Assessment: Danger to Self:  No Self-injurious Behavior: No Danger to Others: No Duty to Warn:no Physical Aggression / Violence:No  Access to Firearms a concern: No  Gang Involvement:No   Subjective: Patient in today reporting anxiety, depression. States she hasn't managed things too well past couple weeks due to her depression being some worse, but not SI.  Interventions: Solution-Oriented/Positive Psychology and Ego-Supportive  Diagnosis:   ICD-10-CM   1. Generalized anxiety disorder  F41.1     Plan: Patient not signing tx plan on computer screen due to [place].  Treatment Goals: Goals remain on plan as patient works on strategies to meet her goals. Progress is noted each visit in "Progress" section of goal plan.  Long Term Goal: Reduce overall level, frequency, and intensity of the anxiety so that daily functioning is not impaired.  Short Term Goal: 1.Increase understanding of the beliefs and messages that produce the worry and anxiety.  Strategies: 1.Help client develop reality-based positive cognitive  messages/self-talk. 2. Develop a "coping card" or other reminder which coping strategies are recorded for patient's later use .  PROGRESS: Patient in today reporting anxiety, depression (some worse), no SI, is in contact with others.  Noticed with her tendency to use food for comfort when stress increases. States her husband got an encouraging report recently with his cancer treatment, in that he does not have any CA in his lymph nodes.  He has 1 more CA treatment next week and then Dr is to decide what is next.  Is following through on some of what we discussed last session re: her connecting with more people and is doing that through participating in some church groups and outreach that also provides her with more socialization, walking some with neighbors, a painting class, and working to treat herself more kindly. Needing to get up and "get going" earlier in the day because "I have the habit of staying in bed later, in my pajamas, and finally get a shower close to noon" but I think I'd do better if I keep myself more on a regular schedule." Encouraging her to look more for the positives versus negatives. Also assumes the worst case scenario and we discussed how that I self-defeating and how to avoid that trap in her thinking. Has been reaching out to more people which has helped.  Continue to encourage patient to get outside some each day as weather permits, to use her journal that friend gave her and personalized with positive quotes and scripture, stop herself criticalness,  decrease the automatic assumptions of worst case scenarios, continue reading resources that are helpful, letting her faith be a source of emotional strength, stay in the present focusing on what she can control, remaining in touch with people that are supportive, interrupting anxious thoughts and replacing with more reality based thoughts, and recognizing the strength she is showing as she continues goal-directed efforts.   Goal  review and progress/challenges noted with patient.  Next appointment within 2 weeks.   Shanon Ace, LCSW

## 2020-11-07 ENCOUNTER — Other Ambulatory Visit: Payer: Self-pay | Admitting: Psychiatry

## 2020-11-07 DIAGNOSIS — F319 Bipolar disorder, unspecified: Secondary | ICD-10-CM

## 2020-11-12 ENCOUNTER — Ambulatory Visit (INDEPENDENT_AMBULATORY_CARE_PROVIDER_SITE_OTHER): Payer: Medicare Other | Admitting: Psychiatry

## 2020-11-12 ENCOUNTER — Telehealth: Payer: Self-pay

## 2020-11-12 DIAGNOSIS — F3132 Bipolar disorder, current episode depressed, moderate: Secondary | ICD-10-CM | POA: Diagnosis not present

## 2020-11-12 NOTE — Telephone Encounter (Signed)
Pt called back and said she left the wrong doctor name its Dr.Dvonquah

## 2020-11-12 NOTE — Progress Notes (Signed)
[facility] Counselor/Therapist Progress Note  Patient ID: Megan Whitney, MRN: [id number],    Date: 11/12/2020  Time Spent: 60 minutes    10:00am to 11:00am  Virtual Visit Note via MyChart Video: Connected with patient by a video enabled telemedicine/telehealth application or telephone, with their informed consent, and verified patient privacy and that I am speaking with the correct person using two identifiers. I discussed the limitations, risks, security and privacy concerns of performing psychotherapy and management service by telephone and the availability of in person appointments. I also discussed with the patient that there may be a patient responsible charge related to this service. The patient expressed understanding and agreed to proceed. I discussed the treatment planning with the patient. The patient was provided an opportunity to ask questions and all were answered. The patient agreed with the plan and demonstrated an understanding of the instructions. The patient was advised to call  our office if  symptoms worsen or feel they are in a crisis state and need immediate contact.   Therapist Location: [facility] Patient Location: home   Treatment Type: Individual Therapy  Reported Symptoms: anxiety (some lower), depression, motivation challenges, some anger   Mental Status Exam:  Appearance:   Casual     Behavior:  Appropriate and Sharing  Motor:  Normal  Speech/Language:   Clear and Coherent  Affect:  anxious (but improved some most recenlty)  Mood:  anxious and depressed  Thought process:  goal directed  Thought content:    some ruminating  Sensory/Perceptual disturbances:    WNL  Orientation:  oriented to person, place, time/date, situation, day of week, month of year and year  Attention:  Fair  Concentration:  Fair  Memory:  "some forgetting but I've had that for quite a while"; Dr knows  [employer] of knowledge:   Good  Insight:    Fair  Judgment:    Good and Fair  Impulse Control:  Fair   Risk Assessment: Danger to Self:  No Self-injurious Behavior: No Danger to Others: No Duty to Warn:no Physical Aggression / Violence:No  Access to Firearms a concern: No  Gang Involvement:No   Subjective: Patient today reporting some improvement in her anxiety, still having depression, some episodic anger with husband, and motivation lower at times. States depression is her stronger symptom.  Interventions: Solution-Oriented/Positive Psychology and Ego-Supportive  Diagnosis:   ICD-10-CM   1. Bipolar disorder with moderate depression ([place])  F31.32      Plan: Patient not signing tx plan on computer screen due to [place].  Treatment Goals: Goals remain on plan as patient works on strategies to meet her goals. Progress is noted each visit in "Progress" section of goal plan.  Long Term Goal: Reduce overall level, frequency, and intensity of the anxiety so that daily functioning is not impaired.  Short Term Goal: 1.Increase understanding of the beliefs and messages that produce the worry and anxiety.  Strategies: 1.Help client develop reality-based positive cognitive messages/self-talk. 2. Develop a "coping card" or other reminder which coping strategies are recorded for patient's later use .  PROGRESS: Patient today reporting anxiety, depression, some anger and challenges with her motivation, and states her depression is her stronger symptom.  Denies any SI. Anxiety is some lower as husband got recent encouraging report on his condition and is now considered to "be in remission".  Harder to get out of bed recently but once I'm up I do stay up. She states" it may be related to  hearing that my husband is in remission and she has just been so on edge about him and now feels some relief. Struggling with using food for comfort when "worried, bored, or tired". Plans to stop buying the junk food and use fruit and other healthier foods instead.  Processed a lot of her anxious/depressive thoughts and concerns today about her husband's health "even thought he is now in remission", tends to look frequently at "what may go wrong versus right" and we worked on this more during session today.  Patient to continue this work, per tx plan above, between sessions to help with her depressive/anxious/negative thoughts and assuming worst case scenarios. Also is looking for some good ways to be involved with others including knitting group, meeting friends for coffee or lunch, walking outside some, and maybe a Bible study, providing more socialization and support network for patient. Encouraged patient and goal-directed behaviors including not assuming worse case scenarios, focusing on better self-care and better self talk, making the efforts to be around other people more, letting her faith be a source of emotional strength, stop self criticalness, avoid the trap of overthinking, get outside some each day as weather permits and walk when possible, continue using reading and online sources that she has found helpful, interrupt anxious/negative/depressive thoughts and replace with more positive, realistic thoughts, recognize and feel good about the strength she is showing and working on goals, and remain in contact with people who are supportive of her.   Goal review and progress/challenges noted with patient.  Next appointment within 2 weeks.   Shanon Ace, LCSW

## 2020-11-12 NOTE — Telephone Encounter (Signed)
Collie Siad called to let to let us know she has an apt with [facility] with Dr. Barton Fanny on Monday 11/16/2020 [phone]   She reports Dr. Clovis Pu wanted to be notified when he apt was.

## 2020-11-16 ENCOUNTER — Telehealth: Payer: Self-pay | Admitting: Psychiatry

## 2020-11-16 DIAGNOSIS — G249 Dystonia, unspecified: Secondary | ICD-10-CM | POA: Diagnosis not present

## 2020-11-16 DIAGNOSIS — G20 Parkinson's disease: Secondary | ICD-10-CM | POA: Diagnosis not present

## 2020-11-16 DIAGNOSIS — R251 Tremor, unspecified: Secondary | ICD-10-CM | POA: Diagnosis not present

## 2020-11-16 DIAGNOSIS — F319 Bipolar disorder, unspecified: Secondary | ICD-10-CM | POA: Diagnosis not present

## 2020-11-16 NOTE — Telephone Encounter (Signed)
Telephone call with [facility] neurology PA that saw the patient today.  Reviewed the long unstable history of bipolar disorder and multiple previous med trials.  Neurology see some EPS likely related to [place].  However they also would like to consider either [place]rez[place] or [place] given her multiple failures of meds for tremor and EPS.  They suspect some TD type symptoms.  They will discuss this with the patient.

## 2020-11-17 ENCOUNTER — Other Ambulatory Visit: Payer: Self-pay

## 2020-11-17 ENCOUNTER — Encounter: Payer: Self-pay | Admitting: Psychiatry

## 2020-11-17 ENCOUNTER — Ambulatory Visit (INDEPENDENT_AMBULATORY_CARE_PROVIDER_SITE_OTHER): Payer: Medicare Other | Admitting: Psychiatry

## 2020-11-17 VITALS — BP 117/85 | HR 89

## 2020-11-17 DIAGNOSIS — F900 Attention-deficit hyperactivity disorder, predominantly inattentive type: Secondary | ICD-10-CM | POA: Diagnosis not present

## 2020-11-17 DIAGNOSIS — G2401 Drug induced subacute dyskinesia: Secondary | ICD-10-CM

## 2020-11-17 DIAGNOSIS — R251 Tremor, unspecified: Secondary | ICD-10-CM | POA: Diagnosis not present

## 2020-11-17 DIAGNOSIS — F314 Bipolar disorder, current episode depressed, severe, without psychotic features: Secondary | ICD-10-CM

## 2020-11-17 DIAGNOSIS — G3184 Mild cognitive impairment, so stated: Secondary | ICD-10-CM

## 2020-11-17 DIAGNOSIS — F3132 Bipolar disorder, current episode depressed, moderate: Secondary | ICD-10-CM

## 2020-11-17 DIAGNOSIS — R296 Repeated falls: Secondary | ICD-10-CM

## 2020-11-17 DIAGNOSIS — F411 Generalized anxiety disorder: Secondary | ICD-10-CM | POA: Diagnosis not present

## 2020-11-17 MED ORDER — DEXMETHYLPHENIDATE HCL 10 MG PO TABS
15.0000 mg | ORAL_TABLET | Freq: Two times a day (BID) | ORAL | 0 refills | Status: DC
Start: 2020-11-17 — End: 2021-01-26

## 2020-11-17 NOTE — Progress Notes (Signed)
Megan Whitney [id number] 1955/11/09 65 y.o.   Video Visit via My Chart  I connected with pt by My Chart and verified that I am speaking with the correct person using two identifiers.   I discussed the limitations, risks, security and privacy concerns of performing an evaluation and management service by My Chart  and the availability of in person appointments. I also discussed with the patient that there may be a patient responsible charge related to this service. The patient expressed understanding and agreed to proceed.  I discussed the assessment and treatment plan with the patient. The patient was provided an opportunity to ask questions and all were answered. The patient agreed with the plan and demonstrated an understanding of the instructions.   The patient was advised to call back or seek an in-person evaluation if the symptoms worsen or if the condition fails to improve as anticipated.  I provided 30 minutes of video time during this encounter.  The patient was located at home and the provider was located office. Session started at 10 and ended at 10:30 AM  Subjective:   Patient ID:  Megan Whitney is a 65 y.o. (DOB 08/17/1956) female.   Chief Complaint:  Chief Complaint  Patient presents with  . Follow-up  . Bipolar affective disorder, depressed, severe (House)  . Medication Reaction    tremor  . Depression  . Anxiety    Depression        Associated symptoms include decreased concentration.  Associated symptoms include no suicidal ideas.  Past medical history includes anxiety.   Anxiety Symptoms include decreased concentration and nervous/anxious behavior. Patient reports no chest pain, confusion, dizziness, nausea, palpitations or suicidal ideas.    Medication Refill Associated symptoms include arthralgias. Pertinent negatives include no chest pain, nausea or weakness.   Megan Whitney is  follow-up of r chronic mood swings and anxiety and frequent changes in  medications.   At visit December 27, 2018.  Focalin XR was increased from 20 mg to 25 mg daily to help with focus and attention and potentially mood.  When seen February 13, 2019.  In an effort to reduce mood cycling we reduce fluoxetine to 20 mg daily.  At visit August 2020.  No meds were changed.  She continued the following: Focalin XR 25 mg every morning and Focalin 10 mg immediate release daily Equetro 200 mg nightly Fluoxetine 20 mg daily Lamotrigine 200 mg twice daily Lithium 150 mg nightly Vraylar 3 mg daily  She called back November 4 after seeing her therapist stating that she was having some hypomanic symptoms with reduced sleep and increased energy.  This potentiality had been discussed and the decision was made to increase Equetro from 200 mg nightly to 300 mg nightly.  seen August 12, 2019.  Because of balance problems she did not tolerate Equetro 300 mg nightly and it was changed to Equetro 200 mg nightly plus immediate release carbamazepine 100 mg nightly.  Her mood had not been stable enough on Equetro 200 mg nightly alone. Less balance problems with change in CBZ.  seen September 23, 2019.  The following was changed: For bipolar mixed increase CBZ IR to 200 mg HS.  Disc fall and balance risks.For bipolar mixed increase CBZ IR to 200 mg HS.  Disc fall and balance risks.  She called back October 23, 2019 stating she had had another fall and felt it was due to the medication.  Therefore carbamazepine immediate release was reduced from 200  mg nightly to 100 mg nightly.  The Equetro is unchanged.  Last seen November 04, 2019.  The following was noted:  Better at the moment but balance is still somewhat of a problem.  Started PT to help balance.  Had a fall after tripping on a curb and hit her head on sidewalk.  Got a concussion with nausea and HA and dizziness and light sensitivity.  Not over it.  Concentration problems.  Has gotten back to work after a week.   Mood sx pretty good with some  mild depression.  Nothing severe.  Trying to minimize stress and self care as much as possible.  No manic sx lately and sleeping fairly well.  No racing thoughts.   Working another year and plans to retire but H alcoholic and not sure it will be good to be there all the time. Seeing therapist q 2 weeks.  Therapy helping . Recent serum vitamin D level was determined to be low at 33.  The goal and chronically depressed patient's is in the 50s if possible.  So her vitamin D was increased on August 08, 2018 or thereabouts.  Checked vitamin D level again and this time it was high at 120 and so it was stopped.  She's restarted per PCP at 1000 units daily.  01/06/2020 appointment the following is noted: Still on: Focalin XR 25 mg every morning and Focalin 10 mg immediate release daily Equetro 200 mg nightly Carbamazepine immediate release 100 mg nightly Fluoxetine 20 mg daily Lamotrigine 200 mg twice daily Lithium 150 mg nightly Vraylar 3 mg daily Not good manic.  Angry.  Missed 2 days bc sx.  Last week vacation which didn't go well.  Crying last week and missed a day.  "Pissed off at the whole world" but also depressed and hard to get OOB today.  Everything makes me angry.   Blows up without control.  Then regrets it.  Sleep irregular lately. Finished PT which might have helped some but still balance problems. Plan: Cannot increase carbamazepine due to balance issues Temporarily Ativan for agitation 0.5 mg tablets  DT mania stop fluoxetine If fails trial loxapine  01/15/2020 patient called after hours with suicidal thoughts and patient was to go to the [facility]. Patient ultimately admitted to [facility] psychiatric unit.  Dr. Clovis Pu spoke with clinical pharmacist they are giving history of medication experience and recommendation for loxapine.  Patient hospital stay for 3 days and discharged on loxapine 10 mg nightly as the new medication.  02/10/2020 phone call  patient complaining of insomnia.  Loxapine was increased from 10 to 20 mg nightly due to recent insomnia with mania.  02/14/2020 appointment with the following noted: Lately in tears Monday and Tuesday convinced she couldn't do her job.  Better last couple of days.  Motivation is not real good but not depressed like Monday and Tuesday. This week missing some meds bc couldn't get like Focalin.  Been taking other meds. No sig manic sx.  Sleep is better with more loxapine about 8 hours. Anxiety is chronic.  No SE loxapine so far unless a little dizzy here and there. No med changes.  02/25/2020 appointment urgently made after patient was recently hospitalized.  The following is noted: Unstable.  Today manic driving erratically.  Talking a mile a minute.  Not thinking clearly.  Angry.  Slept OK last night.  Hyperactive with poor productivity for a couple of days.  Weekend OK overall.  No falls lately. More tremor lately.  Retiring July 30.  Plan: For tremor amantadine 100 mg twice a day if needed. Increase loxapine to 3 capsules 1 to 2 hours before bedtime Reduce Vraylar to 1.5 mg daily or 3 mg every other day.   04/01/2020 appointment with the following noted: Amantadine hs caused NM. Low grade depression for a couple of weeks.  Not severe.   Extended work date 06/04/20 to retire date.  She feels OK about it in some ways but doesn't feel fully up to it.  Doesn't remember when hypomania resolved from last visit.   Sleep is much better now uninterrupted. Hard to remember lithium at lunch. Still has tremor but better with amantadine.  Anxiety still through the roof. Plan: Increase loxapine 40 mg HS.  05/04/20 appt with the following noted:  Increased loxapine to 40.  Anxiety no better.  All kinds of reasons including worry about retirement and paying for things, but worry is probably exaggerated and H say sit is. Sleep good usually.  No SE noted.  Not making her sleep more with change. Still  some manic sx including shortly after last visit and then depressed until the last week.  Irritable and angry. Some panic with SOB and fear of MI. Plan: Continue Vraylar 1.5 mg every day (conisder reduction) Increase loxapine to 50 mg daily for 1 week and if no improvement then increase to 75 mg each night (or 3 of the 25 mg capsules)  Multiple phone calls between appointments with the patient complaining loxapine was causing insomnia.  She has adjusted on timing and dose as she felt it was necessary to make it tolerable because when she takes it in the morning she gets sleepy if she takes very much.  06/09/20 appt Noted: Max tolerated loxapine 25 mg BID.  More than that HS gives strange dreams and difficult to go back to sleep and more in AM too sedated. Not doing well.  Anxiety through the roof.  Did ok with vacation but home worries about everything.   Retired.  Has a lot of time to generally worry.  Started reading again for the first time in awhile.  That's helpful. Takes a while to adjust to retirement.  Anxiety and depression feed each other.  Less interest in some activities.  Later in afternoon is not quite as anxious. Hard to drive with anxiety.   Plan: Reduce to see if it helps reduce anxiety.  Focalin XR 20 mg every morning  and stop Focalin 10 mg immediate release daily Equetro 200 mg nightly Carbamazepine immediate release 100 mg nightly Lamotrigine 200 mg twice daily Lithium 150 mg nightly Continue Vraylar 1.5 mg every day (conisder reduction) continue loxapine to 25 mg BID for longer trial.  07/07/20 appt with the following noted: Tearful and overwhelmed  By [facility] dx of prostate CA with mets bones and nodes with plans for hormone tx and radiation and chemotherapy.  Found out about 3 weeks ago.   He's in sig pain and she's caregiving.  Hard for him to walk even on walker.  Is falling to pieces but realizes it's typical but bc bipolar may be affecting her harder.  Tearful a lot.   Forgetting things, distracted, personal routine disrupted. She still feels the focalin is helpful.  Poor sleep last night bc H but usually 7-8 hours. No effect noticed from Amantadine for tremor. CO more depressed. Plan: Option treat tremor.  change amanatadine 100 mg AM to pramipexole to try  to help tremor and mood off label.  Disc risk mania.  She wants to do it..  07/14/2020 phone call:Megan Whitney called to report that she will be starting Medicare as of January, 2022.  She will be on regular medicare A&B and prescription plan D.  Her Vraylar and Moss Mc will NOT be covered by medicare.  She needs to know if there are other medications to replace these.  The cost for these medications is over $6000 and she can't afford that price.  She has an appt 12/2, but needs to know asap if there are going to be alternate medications and what they are so she can check on coverage. MD response: There are no reasonable alternatives to these medications that will work in the same way.  She needs to get a better Medicare D plan that will cover the Vraylar and Equetro or her psychiatric symptoms will get worse if she stops these medications.  There are better Medicare D plans that we will cover these medicines but obviously those plans are more expensive but I can have no control over that.  08/06/2020 appointment with the following noted: Tremor no better and maybe worse with switch from to pramipexole 0.125 mg BID from Amantadine.  No SE. Depressed and anxious and crying a lot.  Hard to tell if related to H.  Anxiety definitely related to H.  H can't do very much bc pain and on pain meds and anemic.  Transfusion yesterday.  H can't drive or shop.  Too weak.  Says she can't find a medicare plan that will cover Equetro and [employer]. Plan: She wants to continue 10 mg immediate release Focalin daily but try skipping to see if anxiety is better. Increase pramipexole to try to help tremor and mood off label.  Disc risk mania.   She wants to do it.. Increase to 0.5 mg BID.  09/07/2020 appointment with the following noted: At last appointment patient was more depressed and anxious and complaining of tremor.  Additional stress with husband's cancer and poor health. Severe anger problems with 0.5 mg BID and mood swings on pramipexole after a week.  Reduced to 0.5 mg AM and still having the problem. Helped tremor tremdously at the higher dose and worse with lower dose.  Tremor same all day except worse with stress.   Stopped Focalin IR without change. Things have been tough and dealing with depression.  H's cancer really affecting me.  Causing depression and anxiety and often in tears.  Able to care for herself and H.  He doesn't require a lot of care but she's not strong emotionally.   Now on [facility] and worry over med coverage. Plan: So wean and stop it loxapine due to NR and intolerance of higher dose.    09/11/2020 phone call that new Medicare plan would not cover Focalin XR and it was switched to Focalin 10 mg twice daily.  Also informed of high cost of Vraylar with new plan. MD response: As I told her at the last visit, there is nothing similar to Vraylar that is generic.  That is why I suggested she select an insurance plan that would cover it.. Reduce Vraylar that she has remaining to 1 every 3rd day until she runs out.  She may feel OK for awhile without it bc it gets out of the body slowly. We'll see how she's doing at her visit next month   09/25/2020 phone call from patient saying she was more depressed since tapering  off the Vraylar including disorganized thinking and lack of motivation. MD response: Pt got some samples.  However she was warned before switch to Medicare to make sure plan adequately covered Vraylar.   She didn't do this.   We tried all reasonable alternatives to Vraylar which either failed or caused intolerable SE.  I  cannot fix this problem for her.  She will inevitably worsen when she stops an effective  tolerated med.  10/07/2020 patient called back stating she wanted to restart loxapine.  10/19/2020 appointment with the following noted: Says none of Medicare D plans cover Vraylar except with high copay of $400/month. Won't be able to stay on it but is taking some of the Vraylar now.   Currently on Vraylar 1.5 mg daily but that won't last and she'll have to stop it.  Has cut back and feels more depressed markedly. She decided the loxapine was helping some and wanted to restart loxapine 25 mg in AM.  Makes her sleepy.    Paying $90 monthly for Equetro. But had balance probles with CBZ ER. Wasn't taking lithium for a long while and restarted 150 mg HS. Wants to stay on librium 25 mg HS bc it helps sleep but insurance won't pay for it either. Plan: Switch   Focalin XR 20 mg  To IR 15 mg BID DT Cost and off label for depression   Continue the Vraylar as long as she can until she runs out. Pending neurology evaluation  11/17/2020 appointment with the following noted: Frustrated tremor got better in the last week for no apparent reason. [employer] gave them money so taking the [employer] daily for 3 weeks and it's a "huge difference" with depression much better but not gone. So stopped loxapine.   Past Psychiatric Medication Trials: Vraylar 4.5 SE mouth movements reduced to 3 mg 3/20. It was effective at lower doses for depression.  Worse off it. lithium 150,   Trileptal 450, Depakote, Equetro 300 balance issues, CBZ ER falling,  Lamictal 200 twice daily, Latuda 80, , olanzapine, Seroquel, risperidone, Abilify, loxapine 25 mg BID (max tolerated) Focalin,  Ritalin,  fluoxetine 60,  buspirone, sertraline 100, Wellbutrin history of facial tics, paroxetine cognitive side effects ropinirole, amantadine, Sinemet, Artane, Cogentin,  pramipexole 0.5 mg BID helped tremor but caused anger trazodone hangover, Ambien hangover,  Review of Systems:  Review of Systems  HENT: Positive for dental problem and  tinnitus.        Chirping cricket sounds in hears since January  Cardiovascular: Negative for chest pain and palpitations.  Gastrointestinal: Negative for nausea.  Musculoskeletal: Positive for arthralgias. Negative for gait problem.  Neurological: Positive for tremors. Negative for dizziness, seizures, syncope, weakness and light-headedness.       Still balance problems. No falls lately. Tremor is worse in hands  Psychiatric/Behavioral: Positive for decreased concentration, depression and dysphoric mood. Negative for agitation, behavioral problems, confusion, hallucinations, self-injury, sleep disturbance and suicidal ideas. The patient is nervous/anxious. The patient is not hyperactive.   No falls since here. Not currently depressed but unable to remove this from the list.  Medications: I have reviewed the patient's current medications.  Current Outpatient Medications  Medication Sig Dispense Refill  . atorvastatin (LIPITOR) 20 MG tablet Take 1 tablet (20 mg total) by mouth at bedtime. 90 tablet 0  . carbamazepine (TEGRETOL) 100 MG chewable tablet CHEW 1 TABLET BY MOUTH AT BEDTIME. 90 tablet 0  . cetirizine (ZYRTEC) 10 MG tablet Take 1 tablet (10 mg total) by  mouth daily. 90 tablet 3  . chlordiazePOXIDE (LIBRIUM) 25 MG capsule TAKE (1) CAPSULE BY MOUTH AT BEDTIME AS NEEDED FOR ANXIETY 30 capsule 1  . cholecalciferol (VITAMIN D) 25 MCG (1000 UNIT) tablet Take 1,000 Units by mouth daily.    Marland Kitchen docusate sodium (COLACE) 100 MG capsule Take 100 mg by mouth. 1 tab every other day.    . EQUETRO 200 MG CP12 12 hr capsule Take 1 capsule (200 mg total) by mouth at bedtime. 30 capsule 5  . ferrous gluconate (FERGON) 324 MG tablet Take 324 mg by mouth daily with breakfast.    . Ferrous Sulfate (IRON) 325 (65 Fe) MG TABS 1 tablet    . lamoTRIgine (LAMICTAL) 200 MG tablet TAKE (1) TABLET BY MOUTH TWICE DAILY. 180 tablet 0  . lithium carbonate 150 MG capsule TAKE (1) CAPSULE BY MOUTH ONCE DAILY IN THE  AFTERNOON. (Patient taking differently: HS) 90 capsule 0  . pantoprazole (PROTONIX) 40 MG tablet Take 1 tablet (40 mg total) by mouth daily. 90 tablet 2  . VRAYLAR capsule TAKE (1) CAPSULE BY MOUTH ONCE DAILY. 30 capsule 0  . dexmethylphenidate (FOCALIN) 10 MG tablet Take 1.5 tablets (15 mg total) by mouth 2 (two) times daily. 90 tablet 0   No current facility-administered medications for this visit.    Medication Side Effects: Other: tremor and weight gain.  Dyskinesia appears better  SE bettter than they were.  Balance problems intermittently  Allergies:  Allergies  Allergen Reactions  . Azithromycin Anaphylaxis  . Penicillins Anaphylaxis    DID THE REACTION INVOLVE: Swelling of the face/tongue/throat, SOB, or low BP? Yes Sudden or severe rash/hives, skin peeling, or the inside of the mouth or nose? Yes Did it require medical treatment? No When did it last happen? If all above answers are "NO", may proceed with cephalosporin use.  . Adhesive [Tape] Other (See Comments)    On bandaids    Past Medical History:  Diagnosis Date  . ADD (attention deficit disorder)   . Allergy    Seasonal  . Anemia    History of GI blood loss  . Anxiety   . Arthritis   . Bipolar 1 disorder ([place])   . Cancer ([place])   . Colon polyps   . Depression   . Diabetes mellitus ([place])   . Edema, lower extremity   . Epistaxis    Around 2011 or 2012, required cauterization.   . Esophageal stricture   . Fracture of superior pubic ramus ([place]) 11/28/2018  . GERD (gastroesophageal reflux disease)   . Headache(784.0)   . Hyperlipidemia   . Interstitial cystitis   . Joint pain   . Lactose intolerance   . Lung cancer ([place]) 2002  . Obesity   . Osteoarthritis   . Palpitations   . Sleep apnea    Doesn't use a CPAP  . Swallowing difficulty     Family History  Problem Relation Age of Onset  . Arthritis Mother   . Hearing loss Mother   . Hyperlipidemia Mother   . Hypertension Mother   . Depression  Mother   . Anxiety disorder Mother   . Obesity Mother   . Sudden death Mother   . Hypertension Father   . Diabetes Mellitus II Father   . Heart disease Father   . Arthritis Father   . Cancer Father        Brain  . COPD Father   . Diabetes Father   . Hyperlipidemia Father   .  Sleep apnea Father   . Early death Sister        Aneroxia/Bulimic  . Depression Brother   . Early death Investment banker, corporate  . Depression Daughter   . Drug abuse Daughter   . Heart disease Daughter   . Hypertension Daughter   . Stroke Maternal Grandmother   . Hypertension Maternal Grandmother   . Arthritis Maternal Grandfather   . Heart attack Maternal Grandfather   . Hearing loss Maternal Grandfather   . Colon cancer Neg Hx   . Esophageal cancer Neg Hx   . Rectal cancer Neg Hx     Social History   Socioeconomic History  . Marital status: Married    Spouse name: Not on file  . Number of children: 1  . Years of education: Not on file  . Highest education level: Not on file  Occupational History  . Occupation: Admin. assistant  Tobacco Use  . Smoking status: Never Smoker  . Smokeless tobacco: Never Used  Vaping Use  . Vaping Use: Never used  Substance and Sexual Activity  . Alcohol use: Yes    Alcohol/week: 1.0 standard drink    Types: 1 Glasses of wine per week    Comment: Moderate  . Drug use: No  . Sexual activity: Yes  Other Topics Concern  . Not on file  Social History Narrative   Pt lives in [place] with husband Lanny Hurst.  Followed by Dr. Clovis Pu for psychiatry and Rinaldo Cloud for therapy.   Social Determinants of Health   Financial Resource Strain: Not on file  Food Insecurity: Not on file  Transportation Needs: Not on file  Physical Activity: Not on file  Stress: Not on file  Social Connections: Not on file  Intimate Partner Violence: Not on file    Past Medical History, Surgical history, Social history, and Family history were reviewed and updated as  appropriate.   Please see review of systems for further details on the patient's review from today.   Objective:   Physical Exam:  BP 117/85   Pulse 89   Physical Exam Neurological:     Mental Status: She is alert and oriented to person, place, and time.     Cranial Nerves: No dysarthria.     Motor: Tremor present.     Comments: Mild resting R hand rotational tremor Fidgety mildy with feet Very mild lip pursing.  Psychiatric:        Attention and Perception: Attention and perception normal.        Mood and Affect: Mood is anxious and depressed. Affect is not tearful.        Speech: Speech normal.        Behavior: Behavior is cooperative.        Thought Content: Thought content normal. Thought content is not paranoid or delusional. Thought content does not include homicidal or suicidal ideation. Thought content does not include homicidal or suicidal plan.        Cognition and Memory: Cognition and memory normal.        Judgment: Judgment normal.     Comments: Insight intact More depressed with cryiing     Lab Review:     Component Value Date/Time   NA 139 03/02/2020 1433   K 4.5 03/02/2020 1433   CL 103 03/02/2020 1433   CO2 22 03/02/2020 1433   GLUCOSE 83 03/02/2020 1433   GLUCOSE 102 (H) 01/19/2020 1158   BUN 15  03/02/2020 1433   CREATININE 0.76 03/02/2020 1433   CALCIUM 9.4 03/02/2020 1433   PROT 6.8 07/22/2020 1338   PROT 6.4 03/02/2020 1433   ALBUMIN 4.3 07/22/2020 1338   ALBUMIN 4.3 03/02/2020 1433   AST 15 07/22/2020 1338   ALT 10 07/22/2020 1338   ALKPHOS 67 07/22/2020 1338   BILITOT 0.4 07/22/2020 1338   BILITOT 0.2 03/02/2020 1433   GFRNONAA 83 03/02/2020 1433   GFRAA 96 03/02/2020 1433       Component Value Date/Time   WBC 6.0 03/02/2020 1433   WBC 7.4 01/19/2020 1158   RBC 4.39 03/02/2020 1433   RBC 4.19 01/19/2020 1158   HGB 13.7 03/02/2020 1433   HCT 40.4 03/02/2020 1433   PLT 219 03/02/2020 1433   MCV 92 03/02/2020 1433   MCH 31.2  03/02/2020 1433   MCH 30.3 01/19/2020 1158   MCHC 33.9 03/02/2020 1433   MCHC 32.1 01/19/2020 1158   RDW 12.6 03/02/2020 1433   LYMPHSABS 1.6 03/02/2020 1433   MONOABS 0.7 04/04/2019 1004   EOSABS 0.2 03/02/2020 1433   BASOSABS 0.0 03/02/2020 1433  Vitamin D level 33 on 10K units daily on 12/4/`9 Increased to prescription vitamin d 50K units Monday, Wed, Friday.  Rx sent in.   Lithium Lvl  Date Value Ref Range Status  10/21/2018 0.18 (L) 0.60 - 1.20 mmol/L Final    Comment:    Performed at [facility], [address] [place], [place] [address]     No results found for: PHENYTOIN, PHENOBARB, VALPROATE, CBMZ   .res Assessment: Plan:    Bipolar disorder with moderate depression (HCC)  Generalized anxiety disorder  Tremor of both hands  Attention deficit hyperactivity disorder (ADHD), predominantly inattentive type  Mild cognitive impairment  Falling  Bipolar affective disorder, depressed, severe ([place]) - Plan: dexmethylphenidate (FOCALIN) 10 MG tablet  Tardive dyskinesia  Naiara has chronic rapid cycling bipolar disorder which is chronically unstable and has been difficult to control.  The rapid cycling is making it difficult to control frequency of depressive episodes and the anxiety as well.  We have typically had to make frequent med changes.  We have had to reduce mood stabilizer dosages including Vraylar and Equetro she is very sensitive to carbamazepine because higher doses cause dizziness.     More depressed with less Vraylar.  No easy solution to this.  She's tried all FDA approved meds for bipolar depression except Caplyta which she can't afford either.  Failed many other meds too. Disc ECT. She is applied for patient assistance with Arman Filter which is encouraged  11/16/2020 Telephone call with [facility] neurology PA that saw the patient today.  Reviewed the long unstable history of bipolar disorder and multiple previous med trials.  Neurology see some EPS likely  related to [place].  However they also would like to consider either Ingrezza or Austedo given her multiple failures of meds for tremor and EPS.  They suspect some TD type symptoms.  They will discuss this with the patient. Discussed the neurology evaluation at length.  The note is not accessible at this time in epic. Kofi A. Doonquah, MD noted at time tremor was minimal but suspected EPS and TD DT toes wiggling and teeth grinding. We will defer any changes such as Austedo or Ingrezza because of the risk of worsening parkinsonism until the patient is stable on Vraylar dosing.  Wear a mouthguard bc of teeth grinding.    Since last year when she was stable she started  having hypomanic symptoms again.  We increased Equetro back to 300 mg nightly but she she has had balance problems. It had stopped the manic symptoms but then she started having balance problems again and we had to switch it back to Equetro 300 and add carbamazepine immediate release 100 nightly..  Out of work ADD less of a problem.  Discussed the risk of stimulants including that that could contribute to mood instability and mood swings.    She has a high residual anxiety.  It has been impossible to control all of her symptoms simultaneously without causing side effects.  She agrees.  Continue the following Discussed side effects of each medicine. Continue   Focalin XR 20 mg  To IR 15 mg BID DT Cost and off label for depression   Equetro 200 mg nightly Carbamazepine immediate release 100 mg nightly Lamotrigine 200 mg twice daily Lithium 150 mg nightly Continue Vraylar 1.5 mg daily. As long as possible. Extensive discussion of the neurology note and how Arman Filter has a long half-life of 6 weeks or so which can vary from person to person.  As a result of going down on the Vraylar and then restarting it about 3 weeks ago, she is not at maximum blood level yet.  Therefore the tremor may be better today because of stopping the loxapine and  because the Vraylar blood level has not reached its maximum at this time.  If the tremor gets progressively worse over the next 3 weeks it will be an indication the Vraylar is contributing significantly to the tremor.  If this occurs we will reduce the Vraylar to approximately 1.5 mg every other day.  She did not try this dosage for depression for any significant period of time. Vraylar 1.5 mg every third day led to relapse of significant depression. Currently the depression is improved back on Vraylar 1.5 mg but is not resolved.    If cannot get some meds covered will have marked relapse risks DT multiple other med failures and intolerances.  Discussed potential metabolic side effects associated with atypical antipsychotics, as well as potential risk for movement side effects. Advised pt to contact office if movement side effects occur.  She may be having some mild TD with toe movement.   Disc SE meds and this is heightened by the complication of necessary polypharmacy.  Supportive therapy in terms of dealing with husband's addiction and now new dx metastatic prostate CA.Marland Kitchen  She's interested in Bipolar Support Group and info given.  Requires frequent FU DT chronic instability.  FU 4 weeks.  Lynder Parents MD, DFAPA  Please see After Visit Summary for patient specific instructions.    Future Appointments  Date Time Provider [place]  11/19/2020  2:00 PM Shanon Ace, LCSW CP-CP None  11/27/2020 10:00 AM Shanon Ace, LCSW CP-CP None  12/03/2020  9:00 AM Shanon Ace, LCSW CP-CP None  12/09/2020  9:00 AM Shanon Ace, LCSW CP-CP None  12/14/2020  9:00 AM Shanon Ace, LCSW CP-CP None  12/21/2020 10:30 AM Cottle, Billey Co., MD CP-CP None  12/23/2020  9:00 AM Shanon Ace, LCSW CP-CP None  01/21/2021 10:45 AM Cottle, Billey Co., MD CP-CP None  02/04/2021  1:00 PM Lindell Spar, MD RPC-[facility] RPC    No orders of the defined types were placed in this encounter.      -------------------------------

## 2020-11-19 ENCOUNTER — Ambulatory Visit: Payer: Medicare Other | Admitting: Psychiatry

## 2020-11-22 DIAGNOSIS — G249 Dystonia, unspecified: Secondary | ICD-10-CM | POA: Insufficient documentation

## 2020-11-22 DIAGNOSIS — G20 Parkinson's disease: Secondary | ICD-10-CM | POA: Insufficient documentation

## 2020-11-25 ENCOUNTER — Other Ambulatory Visit: Payer: Self-pay

## 2020-11-25 ENCOUNTER — Ambulatory Visit (INDEPENDENT_AMBULATORY_CARE_PROVIDER_SITE_OTHER): Payer: Medicare Other | Admitting: Psychiatry

## 2020-11-25 DIAGNOSIS — F3132 Bipolar disorder, current episode depressed, moderate: Secondary | ICD-10-CM | POA: Diagnosis not present

## 2020-11-25 NOTE — Progress Notes (Signed)
[facility] Counselor/Therapist Progress Note  Patient ID: Megan Whitney, MRN: [id number],    Date: 11/25/2020  Time Spent: 60 minutes    9:00am to 10:00am   Treatment Type: Individual Therapy  Reported Symptoms: depression, anxious, "lack of ability to find joy" for past 3-4 wks  Mental Status Exam:  Appearance:   Casual     Behavior:  Appropriate, Sharing and Motivated  Motor:  Normal  Speech/Language:   Clear and Coherent  Affect:  Depressed  Mood:  anxious and depressed  Thought process:  goal directed  Thought content:    WNL  Sensory/Perceptual disturbances:    WNL  Orientation:  oriented to person, place, time/date, situation, day of week, month of year and year  Attention:  Fair  Concentration:  Fair  Memory:  [place] of knowledge:   Good  Insight:    Fair  Judgment:   Good and Fair  Impulse Control:  Fair   Risk Assessment: Danger to Self:  No Self-injurious Behavior: No Danger to Others: No Duty to Warn:no Physical Aggression / Violence:No  Access to Firearms a concern: No  Gang Involvement:No   Subjective: Patient reporting today that her depression is the stronger symptom, and also having anxiety and worry. Doesn't feel she has been managing very well. "Not doing all the things I feel I need to do, and instead will scroll on my phone or watch a movie". Denies SI.  Interventions: Solution-Oriented/Positive Psychology and Ego-Supportive  Diagnosis:   ICD-10-CM   1. Bipolar disorder with moderate depression ([place])  F31.32    Plan: Patient not signing tx plan on computer screen due to [place].  Treatment Goals: Goals remain on plan as patient works on strategies to meet her goals. Progress is noted each visit in "Progress" section of goal plan.  Long Term Goal: Reduce overall level, frequency, and intensity of the anxiety so that daily functioning is not impaired.  Short Term Goal: 1.Increase understanding of the beliefs and messages  that produce the worry and anxiety.  Strategies: 1.Help client develop reality-based positive cognitive messages/self-talk. 2. Develop a "coping card" or other reminder which coping strategies are recorded for patient's later use .  PROGRESS: Patient in today reporting depression as her main symptom, and also anxiety, and lack of joy. Denies any SI.  Not performing household tasks. Later states she has been with others recently and did feel some joy and fun.  Also enjoyed knitting group last night. Hard to remember pleasant activities even recent ones, when later she's feeling depressed. Still using food for comfort. Lots of focus on anxious/depressive thoughts re: "the possibility of things going wrong versus right, finances, inflated costs of everything, violence in the world".  Frustrated with husband that he has returned to drinking after his cancer doctor advised otherwise and he was able to get some medication to help him refrain from drinking.  Patient states she has "confronted her husband some "but that she let him know she "is not responsible for his drinking".  Worked with her anxious/negative thoughts and efforts to help her step back and replace them with more positive, accurate, and purposeful thoughts that can help her feel more grounded and be able to pursue some positive things such as activities with friends, walking some for exercise, and being in touch with extended family.  Continues to work on not assuming worse case scenarios.  Encouraged patient and focusing on her own self-care, letting her faith be a  source of emotional strength as well as spiritual, practicing improved self talk, take advantage of opportunities to be around people who are healthy for her, stop self criticalness, continue getting outside daily as weather permits, avoiding the trap of overthinking, use her reading and online sources that are helpful for her and coping with her home situation and in her own personal  development, interrupting anxious/negative/depressive thoughts as they occur and replace with more reality based and positive thoughts, stay in the present focusing on what she can control, and feeling good about the strength she is showing as she continues goal-directed efforts and behaviors in the midst of challenging circumstances in her life.  Goal review and progress/challenges noted with patient.  Next appointment within 2 weeks.   Shanon Ace, LCSW

## 2020-11-27 ENCOUNTER — Ambulatory Visit: Payer: Medicare Other | Admitting: Psychiatry

## 2020-12-02 DIAGNOSIS — Z1211 Encounter for screening for malignant neoplasm of colon: Secondary | ICD-10-CM | POA: Diagnosis not present

## 2020-12-02 DIAGNOSIS — Z01419 Encounter for gynecological examination (general) (routine) without abnormal findings: Secondary | ICD-10-CM | POA: Diagnosis not present

## 2020-12-02 DIAGNOSIS — Z9189 Other specified personal risk factors, not elsewhere classified: Secondary | ICD-10-CM | POA: Diagnosis not present

## 2020-12-03 ENCOUNTER — Other Ambulatory Visit: Payer: Self-pay

## 2020-12-03 ENCOUNTER — Ambulatory Visit (INDEPENDENT_AMBULATORY_CARE_PROVIDER_SITE_OTHER): Payer: Medicare Other | Admitting: Psychiatry

## 2020-12-03 DIAGNOSIS — F3132 Bipolar disorder, current episode depressed, moderate: Secondary | ICD-10-CM

## 2020-12-03 MED ORDER — CELECOXIB 50 MG PO CAPS: 50.0000 mg | ORAL_CAPSULE | Freq: Two times a day (BID) | ORAL | 0 refills | Status: DC
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
## 2020-12-03 NOTE — Progress Notes (Signed)
e

## 2020-12-03 NOTE — Telephone Encounter (Signed)
Yeah that is fine 

## 2020-12-08 ENCOUNTER — Ambulatory Visit (INDEPENDENT_AMBULATORY_CARE_PROVIDER_SITE_OTHER): Payer: Medicare Other | Admitting: Psychiatry

## 2020-12-08 ENCOUNTER — Other Ambulatory Visit: Payer: Self-pay

## 2020-12-08 DIAGNOSIS — F3132 Bipolar disorder, current episode depressed, moderate: Secondary | ICD-10-CM

## 2020-12-08 NOTE — Progress Notes (Signed)
[facility] Counselor/Therapist Progress Note  Patient ID: EMILIE CARP, MRN: [id number],    Date: 12/08/2020  Time Spent: 50 minutes    10:00am to 10:50am   Treatment Type: Individual Therapy  Reported Symptoms: depression, anxiety , Denies any SI.  Mental Status Exam:  Appearance:   Casual     Behavior:  Appropriate, Sharing and Motivated  Motor:  Normal  Speech/Language:   Clear and Coherent  Affect:  depressed, anxious  Mood:  anxious and depressed  Thought process:  goal directed  Thought content:    some obsessiveness  Sensory/Perceptual disturbances:    WNL  Orientation:  oriented to person, place, time/date, situation, day of week, month of year and year  Attention:  Good  Concentration:  Good  Memory:  WNL  Fund of knowledge:   Good  Insight:    Fair  Judgment:   Good and Fair  Impulse Control:  Good and Fair   Risk Assessment: Danger to Self:  No Self-injurious Behavior: No Danger to Others: No Duty to Warn:no Physical Aggression / Violence:No  Access to Firearms a concern: No  Gang Involvement:No   Subjective:  Patient today reporting depression and anxiety.  Denies any SI. Being more proactive in interacting with husband.    Interventions: Solution-Oriented/Positive Psychology and Ego-Supportive  Diagnosis:   ICD-10-CM   1. Bipolar disorder with moderate depression ([place])  F31.32      Plan: Patient not signing tx plan on computer screen due to [place].  Treatment Goals: Goals remain on plan as patient works on strategies to meet her goals. Progress is noted each visit in "Progress" section of goal plan.  Long Term Goal: Reduce overall level, frequency, and intensity of the anxiety so that daily functioning is not impaired.  Short Term Goal: 1.Increase understanding of the beliefs and messages that produce the worry and anxiety.  Strategies: 1.Help client develop reality-based positive cognitive messages/self-talk. 2. Develop a  "coping card" or other reminder which coping strategies are recorded for patient's later use .  PROGRESS: Patient in today reporting depression, anxious, anger, and frustrated.  Denies any SI and depression most recently has felt more manageable to patient, which is a difference for her.  Talks today of some anger she feels about husband's cancer and his refusal to take care of himself and follow Dr's instructions.  Angry that husband continues to drink heavily. Processes a lot of her thoughts and feelings re: her anger, frustration, depression, and anxiety  and is showing more strength as she works harder on these.  Not as tearful today and admits some increase in her self-care and self-confidence. Per long and short term goals in tx plan above, worked with patient on her beliefs and messages that lead to increased anxiety/worry, and reviewed prior coping strategies with patient including the use of specific readings, a Coping Card, and behavioral changes.  Patient currently showing some progress that she has maintained a few days, which is significant for her and her bipolar disorder challenges.  Encouraged patient to continue goal-directed behaviors, staying in contact with people who are supportive of her, following through on self-care behaviors reinforced again today, letting her faith be a source of spiritual and emotional support, getting outside daily and walking as weather permits, setting and keeping healthy boundaries with others as needed, stopping self criticalness, reducing her overthinking, practice positive self-care and self talk, staying the present and focusing on what she can control or change, interrupting and replacing  anxious/depressive thoughts as they occur and replace with more reality based and affirming thoughts,  working to better manage stressful and challenging situations especially within the family, and feeling better about the strength she is showing as she works more  consistently on daily challenges in the midst of difficult circumstances in order to move forward in a positive direction.  Goal review and progress/challenges noted with patient.  Next appointment within 2 weeks.   Shanon Ace, LCSW

## 2020-12-09 ENCOUNTER — Ambulatory Visit: Payer: Medicare Other | Admitting: Psychiatry

## 2020-12-11 ENCOUNTER — Other Ambulatory Visit: Payer: Self-pay

## 2020-12-11 ENCOUNTER — Ambulatory Visit (INDEPENDENT_AMBULATORY_CARE_PROVIDER_SITE_OTHER): Payer: Medicare Other

## 2020-12-11 ENCOUNTER — Ambulatory Visit: Payer: Medicare Other | Admitting: Podiatry

## 2020-12-11 ENCOUNTER — Encounter: Payer: Self-pay | Admitting: Podiatry

## 2020-12-11 DIAGNOSIS — M7752 Other enthesopathy of left foot: Secondary | ICD-10-CM | POA: Diagnosis not present

## 2020-12-11 DIAGNOSIS — S99922A Unspecified injury of left foot, initial encounter: Secondary | ICD-10-CM

## 2020-12-11 NOTE — Progress Notes (Signed)
Subjective:  Patient ID: Megan Whitney, female    DOB: 1956-04-02,  MRN: [id number]  Chief Complaint  Patient presents with  . Foot Pain    Left foot pain mainly on the side. PT stated that it is a sharp pain and sometimes it causes her not to be able to walk    65 y.o. female presents with the above complaint.  Patient presents with a complaint of left chronic ankle arthritis with underlying OCD lesion.  Patient states she fell about few weeks ago and her ankle has started bothering her a lot again.  She states is painful to walk on it.  She does not want a boot.  She would like to know if she can get an injection to help her make it go down.  She denies any other acute complaints.   Review of Systems: Negative except as noted in the HPI. Denies N/V/F/Ch.  Past Medical History:  Diagnosis Date  . ADD (attention deficit disorder)   . Allergy    Seasonal  . Anemia    History of GI blood loss  . Anxiety   . Arthritis   . Bipolar 1 disorder ([place])   . Cancer ([place])   . Colon polyps   . Depression   . Diabetes mellitus ([place])   . Edema, lower extremity   . Epistaxis    Around 2011 or 2012, required cauterization.   . Esophageal stricture   . Fracture of superior pubic ramus ([place]) 11/28/2018  . GERD (gastroesophageal reflux disease)   . Headache(784.0)   . Hyperlipidemia   . Interstitial cystitis   . Joint pain   . Lactose intolerance   . Lung cancer ([place]) 2002  . Obesity   . Osteoarthritis   . Palpitations   . Sleep apnea    Doesn't use a CPAP  . Swallowing difficulty     Current Outpatient Medications:  .  atorvastatin (LIPITOR) 20 MG tablet, Take 1 tablet (20 mg total) by mouth at bedtime., Disp: 90 tablet, Rfl: 0 .  carbamazepine (TEGRETOL) 100 MG chewable tablet, CHEW 1 TABLET BY MOUTH AT BEDTIME., Disp: 90 tablet, Rfl: 0 .  celecoxib (CELEBREX) 50 MG capsule, Take 1 capsule (50 mg total) by mouth 2 (two) times daily., Disp: 60 capsule, Rfl: 0 .  cetirizine  (ZYRTEC) 10 MG tablet, Take 1 tablet (10 mg total) by mouth daily., Disp: 90 tablet, Rfl: 3 .  chlordiazePOXIDE (LIBRIUM) 25 MG capsule, TAKE (1) CAPSULE BY MOUTH AT BEDTIME AS NEEDED FOR ANXIETY, Disp: 30 capsule, Rfl: 1 .  cholecalciferol (VITAMIN D) 25 MCG (1000 UNIT) tablet, Take 1,000 Units by mouth daily., Disp: , Rfl:  .  dexmethylphenidate (FOCALIN) 10 MG tablet, Take 1.5 tablets (15 mg total) by mouth 2 (two) times daily., Disp: 90 tablet, Rfl: 0 .  docusate sodium (COLACE) 100 MG capsule, Take 100 mg by mouth. 1 tab every other day., Disp: , Rfl:  .  EQUETRO 200 MG CP12 12 hr capsule, Take 1 capsule (200 mg total) by mouth at bedtime., Disp: 30 capsule, Rfl: 5 .  ferrous gluconate (FERGON) 324 MG tablet, Take 324 mg by mouth daily with breakfast., Disp: , Rfl:  .  Ferrous Sulfate (IRON) 325 (65 Fe) MG TABS, 1 tablet, Disp: , Rfl:  .  lamoTRIgine (LAMICTAL) 200 MG tablet, TAKE (1) TABLET BY MOUTH TWICE DAILY., Disp: 180 tablet, Rfl: 0 .  lithium carbonate 150 MG capsule, TAKE (1) CAPSULE BY MOUTH ONCE DAILY IN THE  AFTERNOON. (Patient taking differently: HS), Disp: 90 capsule, Rfl: 0 .  pantoprazole (PROTONIX) 40 MG tablet, Take 1 tablet (40 mg total) by mouth daily., Disp: 90 tablet, Rfl: 2 .  VRAYLAR capsule, TAKE (1) CAPSULE BY MOUTH ONCE DAILY., Disp: 30 capsule, Rfl: 0  Social History   Tobacco Use  Smoking Status Never Smoker  Smokeless Tobacco Never Used    Allergies  Allergen Reactions  . Azithromycin Anaphylaxis  . Penicillins Anaphylaxis    DID THE REACTION INVOLVE: Swelling of the face/tongue/throat, SOB, or low BP? Yes Sudden or severe rash/hives, skin peeling, or the inside of the mouth or nose? Yes Did it require medical treatment? No When did it last happen? If all above answers are "NO", may proceed with cephalosporin use.  . Adhesive [Tape] Other (See Comments)    On bandaids   Objective:  There were no vitals filed for this visit. There is no height  or weight on file to calculate BMI. Constitutional Well developed. Well nourished.  Vascular Dorsalis pedis pulses palpable bilaterally. Posterior tibial pulses palpable bilaterally. Capillary refill normal to all digits.  No cyanosis or clubbing noted. Pedal hair growth normal.  Neurologic Normal speech. Oriented to person, place, and time. Epicritic sensation to light touch grossly present bilaterally.  Dermatologic Nails well groomed and normal in appearance. No open wounds. No skin lesions.  Orthopedic:  Pain on palpation to the medial lateral gutter of the ankle joint.  Pain with posterior ankle joint as well.  No pain at the peroneal tendon, posterior tibial tendon, Achilles tendon, ATFL.  Pain with range of motion of the ankle joint dorsiflexion as well as plantarflexion active and passive.   Radiographs: 3 views of skeletally mature adult left foot: No fractures noted.  No bony abnormality noted.  Osteoarthritic changes noted to the ankle joint.  No other bony abnormalities noted Assessment:   1. Capsulitis of ankle, left   2. Foot injury, left, initial encounter    Plan:  Patient was evaluated and treated and all questions answered.  Left foot injury -Clinically patient's pain seems to be more exacerbated after she had a fall accident.  Seems like the osteochondral lesion may have been more disrupted and causing a lot of pain in the ankle joint.  I believe she will benefit from injection as described below.  Left osteochondral lesion of the talar dome medial and lateral -Given that her pain is recurring every 3 months or so.  I do not mind continuing to do steroid injection if she tends to get about 3 to 4 months of relief.  Patient states understanding would like to proceed with a steroid injection. -A steroid injection was performed at left ankle using 1% plain Lidocaine and 10 mg of Kenalog. This was well tolerated. -I will see her back again in 3 months for evaluation and  management of the left chronic ankle pain -Continue using Celebrex  No follow-ups on file.

## 2020-12-12 ENCOUNTER — Other Ambulatory Visit: Payer: Self-pay | Admitting: Psychiatry

## 2020-12-12 DIAGNOSIS — F319 Bipolar disorder, unspecified: Secondary | ICD-10-CM

## 2020-12-14 ENCOUNTER — Ambulatory Visit (INDEPENDENT_AMBULATORY_CARE_PROVIDER_SITE_OTHER): Payer: Medicare Other | Admitting: Psychiatry

## 2020-12-14 ENCOUNTER — Other Ambulatory Visit: Payer: Self-pay

## 2020-12-14 DIAGNOSIS — F3132 Bipolar disorder, current episode depressed, moderate: Secondary | ICD-10-CM | POA: Diagnosis not present

## 2020-12-14 NOTE — Progress Notes (Signed)
[facility] Counselor/Therapist Progress Note  Patient ID: Megan Whitney, MRN: [id number],    Date: 12/14/2020  Time Spent: 9:00am to 10:00am   Treatment Type: Individual Therapy  Reported Symptoms: depression, anxiety  Mental Status Exam:  Appearance:   Casual     Behavior:  Appropriate, Sharing and Motivated  Motor:  Normal  Speech/Language:   Clear and Coherent  Affect:  depression, anxiety  Mood:  anxious and depressed  Thought process:  goal directed  Thought content:    some obsessiveness  Sensory/Perceptual disturbances:    WNL  Orientation:  oriented to person, place, time/date, situation, day of week, month of year and year  Attention:  Good  Concentration:  Fair  Memory:  forgetful "but not a lot"  Fund of knowledge:   Good  Insight:    Good and Fair  Judgment:   Good and Fair  Impulse Control:  Good   Risk Assessment: Danger to Self:  No Self-injurious Behavior: No Danger to Others: No Duty to Warn:no Physical Aggression / Violence:No  Access to Firearms a concern: No  Gang Involvement:No   Subjective:  Patient reporting depression and anxiety, with some irritability and "not real good at self-care.    Interventions: Cognitive Behavioral Therapy and Solution-Oriented/Positive Psychology  Diagnosis:   ICD-10-CM   1. Bipolar disorder with moderate depression ([place])  F31.32     Plan: Patient not signing tx plan on computer screen due to [place].  Treatment Goals: Goals remain on plan as patient works on strategies to meet her goals. Progress is noted each visit in "Progress" section of goal plan.  Long Term Goal: Reduce overall level, frequency, and intensity of the anxiety so that daily functioning is not impaired.  Short Term Goal: 1.Increase understanding of the beliefs and messages that produce the worry and anxiety.  Strategies: 1.Help client develop reality-based positive cognitive messages/self-talk. 2. Develop a "coping card"  or other reminder which coping strategies are recorded for patient's later use .  PROGRESS: Patient today reporting depression and anxiety, with some irritability and less self-care. Reports "things at home have calmed down some since last appt, husband not drinking as much past week." Husband is cancer patient and continues to abuse alcohol heavily. Patient has tried to support husband in making healthy decisions but he tends to react angrily when that occurs. Frustrated in supporting patient in his cancer treatment but also" angry that he overdrinks so much. "I hate that my life is so affected by his drinking." Looked with patient at her being able to do meaningful and positive self-care activities in her life regardless of her husband's drinking, including "meeting more people or single women especially within my church" which seemed to be important for her. Was more calm and grounded after processing her concerns and looking at some options for increased positive self-care.  Encouraged patient to build upon the strength she has shown more recently and practicing better self talk, work to improve her self-care, set and keep healthy boundaries with others, let her faith be a source of emotional support as well as spiritual, remain in contact with people who are supportive of her, get outside daily and walk as weather permits, stop self criticalness, reduce her overthinking, stay in the present focusing on what she can control, interrupt anxious/depressive thoughts and replace with more realistic and affirming thoughts, making better choices and managing stress, and feeling good about the strength and efforts she is showing and working on daily  challenges in the midst of challenging circumstances as she tries to move forward.  Goal review and progress/challenges noted with patient.  Next appointment within 2 to 3 weeks.  Shanon Ace, LCSW

## 2020-12-15 NOTE — Telephone Encounter (Signed)
Please review

## 2020-12-21 ENCOUNTER — Telehealth (INDEPENDENT_AMBULATORY_CARE_PROVIDER_SITE_OTHER): Payer: Medicare Other | Admitting: Psychiatry

## 2020-12-21 ENCOUNTER — Encounter: Payer: Self-pay | Admitting: Psychiatry

## 2020-12-21 DIAGNOSIS — Z636 Dependent relative needing care at home: Secondary | ICD-10-CM

## 2020-12-21 DIAGNOSIS — R251 Tremor, unspecified: Secondary | ICD-10-CM | POA: Diagnosis not present

## 2020-12-21 DIAGNOSIS — F3132 Bipolar disorder, current episode depressed, moderate: Secondary | ICD-10-CM

## 2020-12-21 DIAGNOSIS — F411 Generalized anxiety disorder: Secondary | ICD-10-CM

## 2020-12-21 DIAGNOSIS — F900 Attention-deficit hyperactivity disorder, predominantly inattentive type: Secondary | ICD-10-CM

## 2020-12-21 DIAGNOSIS — R296 Repeated falls: Secondary | ICD-10-CM

## 2020-12-21 DIAGNOSIS — G3184 Mild cognitive impairment, so stated: Secondary | ICD-10-CM

## 2020-12-21 NOTE — Progress Notes (Signed)
Megan Whitney [id number] 13-Aug-1956 65 y.o.   Video Visit via My Chart  I connected with pt by My Chart and verified that I am speaking with the correct person using two identifiers.   I discussed the limitations, risks, security and privacy concerns of performing an evaluation and management service by My Chart  and the availability of in person appointments. I also discussed with the patient that there may be a patient responsible charge related to this service. The patient expressed understanding and agreed to proceed.  I discussed the assessment and treatment plan with the patient. The patient was provided an opportunity to ask questions and all were answered. The patient agreed with the plan and demonstrated an understanding of the instructions.   The patient was advised to call back or seek an in-person evaluation if the symptoms worsen or if the condition fails to improve as anticipated.  I provided 30 minutes of video time during this encounter.  The patient was located at home and the provider was located office. Session started at 1045 and ended at 1115 AM  Subjective:   Patient ID:  Megan Whitney is a 65 y.o. (DOB 1955/10/26) female.   Chief Complaint:  Chief Complaint  Patient presents with  . Follow-up  . Bipolar disorder with moderate depression ([place])  . Depression  . Anxiety  . Medication Problem    Depression        Associated symptoms include decreased concentration.  Associated symptoms include no suicidal ideas.  Past medical history includes anxiety.   Anxiety Symptoms include decreased concentration and nervous/anxious behavior. Patient reports no chest pain, confusion, dizziness, nausea, palpitations or suicidal ideas.    Medication Refill Associated symptoms include arthralgias. Pertinent negatives include no chest pain, nausea or weakness.   Megan Whitney is  follow-up of r chronic mood swings and anxiety and frequent changes in medications.    At visit December 27, 2018.  Focalin XR was increased from 20 mg to 25 mg daily to help with focus and attention and potentially mood.  When seen February 13, 2019.  In an effort to reduce mood cycling we reduce fluoxetine to 20 mg daily.  At visit August 2020.  No meds were changed.  She continued the following: Focalin XR 25 mg every morning and Focalin 10 mg immediate release daily Equetro 200 mg nightly Fluoxetine 20 mg daily Lamotrigine 200 mg twice daily Lithium 150 mg nightly Vraylar 3 mg daily  She called back November 4 after seeing her therapist stating that she was having some hypomanic symptoms with reduced sleep and increased energy.  This potentiality had been discussed and the decision was made to increase Equetro from 200 mg nightly to 300 mg nightly.  seen August 12, 2019.  Because of balance problems she did not tolerate Equetro 300 mg nightly and it was changed to Equetro 200 mg nightly plus immediate release carbamazepine 100 mg nightly.  Her mood had not been stable enough on Equetro 200 mg nightly alone. Less balance problems with change in CBZ.  seen September 23, 2019.  The following was changed: For bipolar mixed increase CBZ IR to 200 mg HS.  Disc fall and balance risks.For bipolar mixed increase CBZ IR to 200 mg HS.  Disc fall and balance risks.  She called back October 23, 2019 stating she had had another fall and felt it was due to the medication.  Therefore carbamazepine immediate release was reduced from 200 mg nightly to 100  mg nightly.  The Equetro is unchanged.  Last seen November 04, 2019.  The following was noted:  Better at the moment but balance is still somewhat of a problem.  Started PT to help balance.  Had a fall after tripping on a curb and hit her head on sidewalk.  Got a concussion with nausea and HA and dizziness and light sensitivity.  Not over it.  Concentration problems.  Has gotten back to work after a week.   Mood sx pretty good with some mild  depression.  Nothing severe.  Trying to minimize stress and self care as much as possible.  No manic sx lately and sleeping fairly well.  No racing thoughts.   Working another year and plans to retire but H alcoholic and not sure it will be good to be there all the time. Seeing therapist q 2 weeks.  Therapy helping . Recent serum vitamin D level was determined to be low at 33.  The goal and chronically depressed patient's is in the 50s if possible.  So her vitamin D was increased on August 08, 2018 or thereabouts.  Checked vitamin D level again and this time it was high at 120 and so it was stopped.  She's restarted per PCP at 1000 units daily.  01/06/2020 appointment the following is noted: Still on: Focalin XR 25 mg every morning and Focalin 10 mg immediate release daily Equetro 200 mg nightly Carbamazepine immediate release 100 mg nightly Fluoxetine 20 mg daily Lamotrigine 200 mg twice daily Lithium 150 mg nightly Vraylar 3 mg daily Not good manic.  Angry.  Missed 2 days bc sx.  Last week vacation which didn't go well.  Crying last week and missed a day.  "Pissed off at the whole world" but also depressed and hard to get OOB today.  Everything makes me angry.   Blows up without control.  Then regrets it.  Sleep irregular lately. Finished PT which might have helped some but still balance problems. Plan: Cannot increase carbamazepine due to balance issues Temporarily Ativan for agitation 0.5 mg tablets  DT mania stop fluoxetine If fails trial loxapine  01/15/2020 patient called after hours with suicidal thoughts and patient was to go to the [facility]. Patient ultimately admitted to [facility] psychiatric unit.  Dr. Clovis Pu spoke with clinical pharmacist they are giving history of medication experience and recommendation for loxapine.  Patient hospital stay for 3 days and discharged on loxapine 10 mg nightly as the new medication.  02/10/2020 phone call  patient complaining of insomnia.  Loxapine was increased from 10 to 20 mg nightly due to recent insomnia with mania.  02/14/2020 appointment with the following noted: Lately in tears Monday and Tuesday convinced she couldn't do her job.  Better last couple of days.  Motivation is not real good but not depressed like Monday and Tuesday. This week missing some meds bc couldn't get like Focalin.  Been taking other meds. No sig manic sx.  Sleep is better with more loxapine about 8 hours. Anxiety is chronic.  No SE loxapine so far unless a little dizzy here and there. No med changes.  02/25/2020 appointment urgently made after patient was recently hospitalized.  The following is noted: Unstable.  Today manic driving erratically.  Talking a mile a minute.  Not thinking clearly.  Angry.  Slept OK last night.  Hyperactive with poor productivity for a couple of days.  Weekend OK overall.   No falls lately.  More tremor lately.  Retiring July 30.  Plan: For tremor amantadine 100 mg twice a day if needed. Increase loxapine to 3 capsules 1 to 2 hours before bedtime Reduce Vraylar to 1.5 mg daily or 3 mg every other day.   04/01/2020 appointment with the following noted: Amantadine hs caused NM. Low grade depression for a couple of weeks.  Not severe.   Extended work date 06/04/20 to retire date.  She feels OK about it in some ways but doesn't feel fully up to it.  Doesn't remember when hypomania resolved from last visit.   Sleep is much better now uninterrupted. Hard to remember lithium at lunch. Still has tremor but better with amantadine.  Anxiety still through the roof. Plan: Increase loxapine 40 mg HS.  05/04/20 appt with the following noted:  Increased loxapine to 40.  Anxiety no better.  All kinds of reasons including worry about retirement and paying for things, but worry is probably exaggerated and H say sit is. Sleep good usually.  No SE noted.  Not making her sleep more with change. Still  some manic sx including shortly after last visit and then depressed until the last week.  Irritable and angry. Some panic with SOB and fear of MI. Plan: Continue Vraylar 1.5 mg every day (conisder reduction) Increase loxapine to 50 mg daily for 1 week and if no improvement then increase to 75 mg each night (or 3 of the 25 mg capsules)  Multiple phone calls between appointments with the patient complaining loxapine was causing insomnia.  She has adjusted on timing and dose as she felt it was necessary to make it tolerable because when she takes it in the morning she gets sleepy if she takes very much.  06/09/20 appt Noted: Max tolerated loxapine 25 mg BID.  More than that HS gives strange dreams and difficult to go back to sleep and more in AM too sedated. Not doing well.  Anxiety through the roof.  Did ok with vacation but home worries about everything.   Retired.  Has a lot of time to generally worry.  Started reading again for the first time in awhile.  That's helpful. Takes a while to adjust to retirement.  Anxiety and depression feed each other.  Less interest in some activities.  Later in afternoon is not quite as anxious. Hard to drive with anxiety.   Plan: Reduce to see if it helps reduce anxiety.  Focalin XR 20 mg every morning  and stop Focalin 10 mg immediate release daily Equetro 200 mg nightly Carbamazepine immediate release 100 mg nightly Lamotrigine 200 mg twice daily Lithium 150 mg nightly Continue Vraylar 1.5 mg every day (conisder reduction) continue loxapine to 25 mg BID for longer trial.  07/07/20 appt with the following noted: Tearful and overwhelmed  By [facility] dx of prostate CA with mets bones and nodes with plans for hormone tx and radiation and chemotherapy.  Found out about 3 weeks ago.   He's in sig pain and she's caregiving.  Hard for him to walk even on walker.  Is falling to pieces but realizes it's typical but bc bipolar may be affecting her harder.  Tearful a lot.   Forgetting things, distracted, personal routine disrupted. She still feels the focalin is helpful.  Poor sleep last night bc H but usually 7-8 hours. No effect noticed from Amantadine for tremor. CO more depressed. Plan: Option treat tremor.  change amanatadine 100 mg AM to pramipexole to try to help tremor  and mood off label.  Disc risk mania.  She wants to do it..  07/14/2020 phone call:Sue called to report that she will be starting Medicare as of January, 2022.  She will be on regular medicare A&B and prescription plan D.  Her Vraylar and [employer] Mc will NOT be covered by medicare.  She needs to know if there are other medications to replace these.  The cost for these medications is over $6000 and she can't afford that price.  She has an appt 12/2, but needs to know asap if there are going to be alternate medications and what they are so she can check on coverage. MD response: There are no reasonable alternatives to these medications that will work in the same way.  She needs to get a better Medicare D plan that will cover the Vraylar and Equetro or her psychiatric symptoms will get worse if she stops these medications.  There are better Medicare D plans that we will cover these medicines but obviously those plans are more expensive but I can have no control over that.  08/06/2020 appointment with the following noted: Tremor no better and maybe worse with switch from to pramipexole 0.125 mg BID from Amantadine.  No SE. Depressed and anxious and crying a lot.  Hard to tell if related to H.  Anxiety definitely related to H.  H can't do very much bc pain and on pain meds and anemic.  Transfusion yesterday.  H can't drive or shop.  Too weak.  Says she can't find a medicare plan that will cover Equetro and [employer]. Plan: She wants to continue 10 mg immediate release Focalin daily but try skipping to see if anxiety is better. Increase pramipexole to try to help tremor and mood off label.  Disc risk mania.   She wants to do it.. Increase to 0.5 mg BID.  09/07/2020 appointment with the following noted: At last appointment patient was more depressed and anxious and complaining of tremor.  Additional stress with husband's cancer and poor health. Severe anger problems with 0.5 mg BID and mood swings on pramipexole after a week.  Reduced to 0.5 mg AM and still having the problem. Helped tremor tremdously at the higher dose and worse with lower dose.  Tremor same all day except worse with stress.   Stopped Focalin IR without change. Things have been tough and dealing with depression.  H's cancer really affecting me.  Causing depression and anxiety and often in tears.  Able to care for herself and H.  He doesn't require a lot of care but she's not strong emotionally.   Now on [facility] and worry over med coverage. Plan: So wean and stop it loxapine due to NR and intolerance of higher dose.    09/11/2020 phone call that new Medicare plan would not cover Focalin XR and it was switched to Focalin 10 mg twice daily.  Also informed of high cost of Vraylar with new plan. MD response: As I told her at the last visit, there is nothing similar to Vraylar that is generic.  That is why I suggested she select an insurance plan that would cover it.. Reduce Vraylar that she has remaining to 1 every 3rd day until she runs out.  She may feel OK for awhile without it bc it gets out of the body slowly. We'll see how she's doing at her visit next month   09/25/2020 phone call from patient saying she was more depressed since tapering off the [employer]  including disorganized thinking and lack of motivation. MD response: Pt got some samples.  However she was warned before switch to Medicare to make sure plan adequately covered Vraylar.   She didn't do this.   We tried all reasonable alternatives to Vraylar which either failed or caused intolerable SE.  I  cannot fix this problem for her.  She will inevitably worsen when she stops an effective  tolerated med.  10/07/2020 patient called back stating she wanted to restart loxapine.  10/19/2020 appointment with the following noted: Says none of Medicare D plans cover Vraylar except with high copay of $400/month. Won't be able to stay on it but is taking some of the Vraylar now.   Currently on Vraylar 1.5 mg daily but that won't last and she'll have to stop it.  Has cut back and feels more depressed markedly. She decided the loxapine was helping some and wanted to restart loxapine 25 mg in AM.  Makes her sleepy.    Paying $90 monthly for Equetro. But had balance probles with CBZ ER. Wasn't taking lithium for a long while and restarted 150 mg HS. Wants to stay on librium 25 mg HS bc it helps sleep but insurance won't pay for it either. Plan: Switch   Focalin XR 20 mg  To IR 15 mg BID DT Cost and off label for depression   Continue the Vraylar as long as she can until she runs out. Pending neurology evaluation  11/17/2020 appointment with the following noted: Frustrated tremor got better in the last week for no apparent reason. [employer] gave them money so taking the [employer] daily for 3 weeks and it's a "huge difference" with depression much better but not gone. So stopped loxapine.   12/21/2020 appointment with the following noted:  Able to stay on Vraylar 1.5 mg daily but still having depression and hard to function.  Not sure why that is unless dealing with H's cancer.  H had some good news with pending bone scan and Cat scan.  Now he's having a lot of pain even on pain meds.  He's also started drinking again and that worries her.  Therefore worried.   Retired.  So mind is freer to worry but trying to stay active.   Tolerating the meds well.  Tremor is better than it was, but worse with stress.   Hygiene is not as good as usual for showering. Able to stay on Focalin 15 mg BID usually.  No SE other than tremor. Sleep is pretty good usually.   Past Psychiatric Medication Trials: Vraylar  4.5 SE mouth movements reduced to 3 mg 3/20. It was effective at lower doses for depression.  Worse off it.  Vraylar 1.5 mg every third day led to relapse of significant depression. lithium 150,   Trileptal 450, Depakote, Equetro 300 balance issues, CBZ ER falling,   Lamictal 200 twice daily, Latuda 80, , olanzapine, Seroquel, risperidone, Abilify, loxapine 25 mg BID (max tolerated) Focalin,  Ritalin,  fluoxetine 60,  buspirone, sertraline 100, Wellbutrin history of facial tics, paroxetine cognitive side effects ropinirole, amantadine, Sinemet, Artane, Cogentin,  pramipexole 0.5 mg BID helped tremor but caused anger trazodone hangover, Ambien hangover,  Review of Systems:  Review of Systems  HENT: Positive for dental problem and tinnitus.        Chirping cricket sounds in hears since January  Cardiovascular: Negative for chest pain and palpitations.  Gastrointestinal: Negative for nausea.  Musculoskeletal: Positive for arthralgias. Negative for gait problem.  Neurological: Positive for tremors. Negative for dizziness, seizures, syncope, weakness and light-headedness.       Still balance problems. No falls lately. Tremor is worse in hands  Psychiatric/Behavioral: Positive for decreased concentration, depression and dysphoric mood. Negative for agitation, behavioral problems, confusion, hallucinations, self-injury, sleep disturbance and suicidal ideas. The patient is nervous/anxious. The patient is not hyperactive.   No falls since here. Not currently depressed but unable to remove this from the list.  Medications: I have reviewed the patient's current medications.  Current Outpatient Medications  Medication Sig Dispense Refill  . atorvastatin (LIPITOR) 20 MG tablet Take 1 tablet (20 mg total) by mouth at bedtime. 90 tablet 0  . carbamazepine (TEGRETOL) 100 MG chewable tablet CHEW 1 TABLET BY MOUTH AT BEDTIME. 90 tablet 0  . celecoxib (CELEBREX) 50 MG capsule Take 1 capsule (50 mg  total) by mouth 2 (two) times daily. 60 capsule 0  . cetirizine (ZYRTEC) 10 MG tablet Take 1 tablet (10 mg total) by mouth daily. 90 tablet 3  . chlordiazePOXIDE (LIBRIUM) 25 MG capsule TAKE (1) CAPSULE BY MOUTH AT BEDTIME AS NEEDED FOR ANXIETY 30 capsule 1  . cholecalciferol (VITAMIN D) 25 MCG (1000 UNIT) tablet Take 1,000 Units by mouth daily.    Marland Kitchen dexmethylphenidate (FOCALIN) 10 MG tablet Take 1.5 tablets (15 mg total) by mouth 2 (two) times daily. 90 tablet 0  . EQUETRO 200 MG CP12 12 hr capsule Take 1 capsule (200 mg total) by mouth at bedtime. 30 capsule 5  . ferrous gluconate (FERGON) 324 MG tablet Take 324 mg by mouth daily with breakfast.    . Ferrous Sulfate (IRON) 325 (65 Fe) MG TABS 1 tablet    . lamoTRIgine (LAMICTAL) 200 MG tablet TAKE (1) TABLET BY MOUTH TWICE DAILY. 180 tablet 0  . lithium carbonate 150 MG capsule TAKE (1) CAPSULE BY MOUTH ONCE DAILY IN THE AFTERNOON. (Patient taking differently: HS) 90 capsule 0  . pantoprazole (PROTONIX) 40 MG tablet Take 1 tablet (40 mg total) by mouth daily. 90 tablet 2  . VRAYLAR capsule TAKE (1) CAPSULE BY MOUTH ONCE DAILY. (Patient taking differently: Take 1.5 mg by mouth daily.) 30 capsule 0  . docusate sodium (COLACE) 100 MG capsule Take 100 mg by mouth. 1 tab every other day. (Patient not taking: Reported on 12/21/2020)     No current facility-administered medications for this visit.    Medication Side Effects: Other: tremor and weight gain.  Dyskinesia appears better  SE bettter than they were.  Balance problems intermittently  Allergies:  Allergies  Allergen Reactions  . Azithromycin Anaphylaxis  . Penicillins Anaphylaxis    DID THE REACTION INVOLVE: Swelling of the face/tongue/throat, SOB, or low BP? Yes Sudden or severe rash/hives, skin peeling, or the inside of the mouth or nose? Yes Did it require medical treatment? No When did it last happen? If all above answers are "NO", may proceed with cephalosporin use.  .  Adhesive [Tape] Other (See Comments)    On bandaids    Past Medical History:  Diagnosis Date  . ADD (attention deficit disorder)   . Allergy    Seasonal  . Anemia    History of GI blood loss  . Anxiety   . Arthritis   . Bipolar 1 disorder ([place])   . Cancer ([place])   . Colon polyps   . Depression   . Diabetes mellitus ([place])   . Edema, lower extremity   . Epistaxis    Around 2011 or 2012,  required cauterization.   . Esophageal stricture   . Fracture of superior pubic ramus ([place]) 11/28/2018  . GERD (gastroesophageal reflux disease)   . Headache(784.0)   . Hyperlipidemia   . Interstitial cystitis   . Joint pain   . Lactose intolerance   . Lung cancer ([place]) 2002  . Obesity   . Osteoarthritis   . Palpitations   . Sleep apnea    Doesn't use a CPAP  . Swallowing difficulty     Family History  Problem Relation Age of Onset  . Arthritis Mother   . Hearing loss Mother   . Hyperlipidemia Mother   . Hypertension Mother   . Depression Mother   . Anxiety disorder Mother   . Obesity Mother   . Sudden death Mother   . Hypertension Father   . Diabetes Mellitus II Father   . Heart disease Father   . Arthritis Father   . Cancer Father        Brain  . COPD Father   . Diabetes Father   . Hyperlipidemia Father   . Sleep apnea Father   . Early death Sister        Aneroxia/Bulimic  . Depression Brother   . Early death Investment banker, corporate  . Depression Daughter   . Drug abuse Daughter   . Heart disease Daughter   . Hypertension Daughter   . Stroke Maternal Grandmother   . Hypertension Maternal Grandmother   . Arthritis Maternal Grandfather   . Heart attack Maternal Grandfather   . Hearing loss Maternal Grandfather   . Colon cancer Neg Hx   . Esophageal cancer Neg Hx   . Rectal cancer Neg Hx     Social History   Socioeconomic History  . Marital status: Married    Spouse name: Not on file  . Number of children: 1  . Years of education: Not on file  .  Highest education level: Not on file  Occupational History  . Occupation: Admin. assistant  Tobacco Use  . Smoking status: Never Smoker  . Smokeless tobacco: Never Used  Vaping Use  . Vaping Use: Never used  Substance and Sexual Activity  . Alcohol use: Yes    Alcohol/week: 1.0 standard drink    Types: 1 Glasses of wine per week    Comment: Moderate  . Drug use: No  . Sexual activity: Yes  Other Topics Concern  . Not on file  Social History Narrative   Pt lives in [place] with husband Lanny Hurst.  Followed by Dr. Clovis Pu for psychiatry and Rinaldo Cloud for therapy.   Social Determinants of Health   Financial Resource Strain: Not on file  Food Insecurity: Not on file  Transportation Needs: Not on file  Physical Activity: Not on file  Stress: Not on file  Social Connections: Not on file  Intimate Partner Violence: Not on file    Past Medical History, Surgical history, Social history, and Family history were reviewed and updated as appropriate.   Please see review of systems for further details on the patient's review from today.   Objective:   Physical Exam:  There were no vitals taken for this visit.  Physical Exam Neurological:     Mental Status: She is alert and oriented to person, place, and time.     Cranial Nerves: No dysarthria.     Motor: Tremor present.     Comments: Mild resting R hand rotational tremor Fidgety mildy  with feet Very mild lip pursing.  Psychiatric:        Attention and Perception: Attention and perception normal.        Mood and Affect: Mood is anxious and depressed. Affect is not tearful.        Speech: Speech normal.        Behavior: Behavior is cooperative.        Thought Content: Thought content normal. Thought content is not paranoid or delusional. Thought content does not include homicidal or suicidal ideation. Thought content does not include homicidal or suicidal plan.        Cognition and Memory: Cognition and memory normal.         Judgment: Judgment normal.     Comments: Insight intact More depressed with cryiing     Lab Review:     Component Value Date/Time   NA 139 03/02/2020 1433   K 4.5 03/02/2020 1433   CL 103 03/02/2020 1433   CO2 22 03/02/2020 1433   GLUCOSE 83 03/02/2020 1433   GLUCOSE 102 (H) 01/19/2020 1158   BUN 15 03/02/2020 1433   CREATININE 0.76 03/02/2020 1433   CALCIUM 9.4 03/02/2020 1433   PROT 6.8 07/22/2020 1338   PROT 6.4 03/02/2020 1433   ALBUMIN 4.3 07/22/2020 1338   ALBUMIN 4.3 03/02/2020 1433   AST 15 07/22/2020 1338   ALT 10 07/22/2020 1338   ALKPHOS 67 07/22/2020 1338   BILITOT 0.4 07/22/2020 1338   BILITOT 0.2 03/02/2020 1433   GFRNONAA 83 03/02/2020 1433   GFRAA 96 03/02/2020 1433       Component Value Date/Time   WBC 6.0 03/02/2020 1433   WBC 7.4 01/19/2020 1158   RBC 4.39 03/02/2020 1433   RBC 4.19 01/19/2020 1158   HGB 13.7 03/02/2020 1433   HCT 40.4 03/02/2020 1433   PLT 219 03/02/2020 1433   MCV 92 03/02/2020 1433   MCH 31.2 03/02/2020 1433   MCH 30.3 01/19/2020 1158   MCHC 33.9 03/02/2020 1433   MCHC 32.1 01/19/2020 1158   RDW 12.6 03/02/2020 1433   LYMPHSABS 1.6 03/02/2020 1433   MONOABS 0.7 04/04/2019 1004   EOSABS 0.2 03/02/2020 1433   BASOSABS 0.0 03/02/2020 1433  Vitamin D level 33 on 10K units daily on 12/4/`9 Increased to prescription vitamin d 50K units Monday, Wed, Friday.  Rx sent in.   Lithium Lvl  Date Value Ref Range Status  10/21/2018 0.18 (L) 0.60 - 1.20 mmol/L Final    Comment:    Performed at [facility], [address] [place], [place] [address]     No results found for: PHENYTOIN, PHENOBARB, VALPROATE, CBMZ   .res Assessment: Plan:    Bipolar disorder with moderate depression ([place])  Generalized anxiety disorder  Attention deficit hyperactivity disorder (ADHD), predominantly inattentive type  Tremor of both hands  Mild cognitive impairment  Falling  Caregiver stress  Addysen has chronic rapid cycling bipolar  disorder which is chronically unstable and has been difficult to control.  The rapid cycling is making it difficult to control frequency of depressive episodes and the anxiety as well.  We have typically had to make frequent med changes.  We have had to reduce mood stabilizer dosages including Vraylar and Equetro she is very sensitive to carbamazepine because higher doses cause dizziness.     More depressed with less Vraylar.  No easy solution to this.  She's tried all FDA approved meds for bipolar depression except Caplyta which she can't afford either.  Failed many other meds too. Disc ECT.  She was denie patient assistance with Vraylar.  She is more depressed at the moment but it is likely that the added stress with her husband is a contributing factor and unlikely that any immediate med change is going to help.  She is failed multiple prior meds for depression.  Caplyta is the only reasonable alternative and as mentioned is going to be expensive and probably unobtainable. So there will be no med changes today.  11/16/2020 Telephone call with [facility] neurology PA that saw the patient today.  Reviewed the long unstable history of bipolar disorder and multiple previous med trials.  Neurology see some EPS likely related to [place].  However they also would like to consider either Ingrezza or Austedo given her multiple failures of meds for tremor and EPS.  They suspect some TD type symptoms.  They will discuss this with the patient. Discussed the neurology evaluation at length.  The note is not accessible at this time in epic. Kofi A. Doonquah, MD noted at time tremor was minimal but suspected EPS and TD DT toes wiggling and teeth grinding. We will defer any changes such as Austedo or Ingrezza because of the risk of worsening parkinsonism until the patient is stable on Vraylar dosing.  Wear a mouthguard bc of teeth grinding.    Since last year when she was stable she started having hypomanic  symptoms again.  We increased Equetro back to 300 mg nightly but she she has had balance problems. It had stopped the manic symptoms but then she started having balance problems again and we had to switch it back to Equetro 300 and add carbamazepine immediate release 100 nightly..  Out of work ADD less of a problem.  Discussed the risk of stimulants including that that could contribute to mood instability and mood swings.    She has a high residual anxiety.  It has been impossible to control all of her symptoms simultaneously without causing side effects.  She agrees.  Continue the following Discussed side effects of each medicine. Continue   Focalin XR 20 mg  To IR 15 mg BID DT Cost and off label for depression   Equetro 200 mg nightly Carbamazepine immediate release 100 mg nightly Lamotrigine 200 mg twice daily Lithium 150 mg nightly Continue Vraylar 1.5 mg daily. As long as possible.    Extensive discussion of the neurology note and how Arman Filter has a long half-life of 6 weeks or so which can vary from person to person.  As a result of going down on the Vraylar and then restarting it about 3 weeks ago, she is not at maximum blood level yet.  Therefore the tremor may be better today because of stopping the loxapine and because the Vraylar blood level has not reached its maximum at this time.  If the tremor gets progressively worse over the next 3 weeks it will be an indication the Vraylar is contributing significantly to the tremor.  If this occurs we will reduce the Vraylar to approximately 1.5 mg every other day.  She did not try this dosage for depression for any significant period of time. Vraylar 1.5 mg every third day led to relapse of significant depression. Currently the depression is improved back on Vraylar 1.5 mg but is not resolved. Or can take 3 mg every other day.  Can pick up samples    If cannot get some meds covered will have marked relapse risks DT multiple other med  failures and  intolerances.  Discussed potential metabolic side effects associated with atypical antipsychotics, as well as potential risk for movement side effects. Advised pt to contact office if movement side effects occur.  She may be having some mild TD with toe movement.   Disc SE meds and this is heightened by the complication of necessary polypharmacy.  Supportive therapy in terms of dealing with husband's addiction and now new dx metastatic prostate CA.Marland Kitchen  She's interested in Bipolar Support Group and info given.  Requires frequent FU DT chronic instability.  FU 6 weeks.  Lynder Parents MD, DFAPA  Please see After Visit Summary for patient specific instructions.    Future Appointments  Date Time Provider [place]  12/23/2020  9:00 AM Shanon Ace, LCSW [facility] None  12/31/2020 11:00 AM Shanon Ace, LCSW [facility] None  01/14/2021 10:00 AM Shanon Ace, LCSW [facility] None  01/20/2021 11:00 AM Shanon Ace, LCSW [facility] None  01/21/2021 10:45 AM Cottle, Billey Co., MD [facility] None  02/04/2021  1:00 PM Lindell Spar, MD [facility]  06/11/2021 10:15 AM Felipa Furnace, DPM [facility]    No orders of the defined types were placed in this encounter.     -------------------------------

## 2020-12-22 ENCOUNTER — Telehealth (INDEPENDENT_AMBULATORY_CARE_PROVIDER_SITE_OTHER): Payer: Medicare Other | Admitting: Nurse Practitioner

## 2020-12-22 ENCOUNTER — Encounter: Payer: Self-pay | Admitting: Nurse Practitioner

## 2020-12-22 ENCOUNTER — Other Ambulatory Visit: Payer: Self-pay

## 2020-12-22 DIAGNOSIS — J069 Acute upper respiratory infection, unspecified: Secondary | ICD-10-CM | POA: Diagnosis not present

## 2020-12-22 MED ORDER — FLUTICASONE PROPIONATE 50 MCG/ACT NA SUSP
NASAL | 6 refills | Status: DC
Start: 2020-12-22 — End: 2021-08-09

## 2020-12-22 MED ORDER — NOREL AD 4-10-325 MG PO TABS
1.0000 | ORAL_TABLET | ORAL | 1 refills | Status: DC | PRN
Start: 2020-12-22 — End: 2021-02-04

## 2020-12-22 NOTE — Progress Notes (Signed)
Acute Office Visit  Subjective:    Patient ID: Megan Whitney, female    DOB: 1956/01/08, 65 y.o.   MRN: [id number]  Chief Complaint  Patient presents with  . Sinus Problem    Sinus congestion, headache ongoing for several days.  . Cough    Onoging x1 day ago.  . Fever    99.9 this morning    HPI Patient is in today for sick visit. She has been experiencing headache, cough, sneezing, and congestion. She tested negative for COVID.   Past Medical History:  Diagnosis Date  . ADD (attention deficit disorder)   . Allergy    Seasonal  . Anemia    History of GI blood loss  . Anxiety   . Arthritis   . Bipolar 1 disorder ([place])   . Cancer ([place])   . Colon polyps   . Depression   . Diabetes mellitus ([place])   . Edema, lower extremity   . Epistaxis    Around 2011 or 2012, required cauterization.   . Esophageal stricture   . Fracture of superior pubic ramus ([place]) 11/28/2018  . GERD (gastroesophageal reflux disease)   . Headache(784.0)   . Hyperlipidemia   . Interstitial cystitis   . Joint pain   . Lactose intolerance   . Lung cancer ([place]) 2002  . Obesity   . Osteoarthritis   . Palpitations   . Sleep apnea    Doesn't use a CPAP  . Swallowing difficulty     Past Surgical History:  Procedure Laterality Date  . BALLOON DILATION  05/16/2012   Procedure: BALLOON DILATION;  Surgeon: Inda Castle, MD;  Location: [place];  Service: Endoscopy;  Laterality: N/A;  . BUNIONECTOMY  2011  . COLONOSCOPY    . ENTEROSCOPY  05/16/2012   Procedure: ENTEROSCOPY;  Surgeon: Inda Castle, MD;  Location: [place];  Service: Endoscopy;  Laterality: N/A;  . JOINT REPLACEMENT    . right shoulder durgery 25 yrs ago  1988  . TOTAL HIP ARTHROPLASTY  2006, 2008   bilateral  . TUBAL LIGATION  1990  . WEDGE RESECTION  2002   lung cancer    Family History  Problem Relation Age of Onset  . Arthritis Mother   . Hearing loss Mother   . Hyperlipidemia Mother   . Hypertension  Mother   . Depression Mother   . Anxiety disorder Mother   . Obesity Mother   . Sudden death Mother   . Hypertension Father   . Diabetes Mellitus II Father   . Heart disease Father   . Arthritis Father   . Cancer Father        Brain  . COPD Father   . Diabetes Father   . Hyperlipidemia Father   . Sleep apnea Father   . Early death Sister        Aneroxia/Bulimic  . Depression Brother   . Early death Investment banker, corporate  . Depression Daughter   . Drug abuse Daughter   . Heart disease Daughter   . Hypertension Daughter   . Stroke Maternal Grandmother   . Hypertension Maternal Grandmother   . Arthritis Maternal Grandfather   . Heart attack Maternal Grandfather   . Hearing loss Maternal Grandfather   . Colon cancer Neg Hx   . Esophageal cancer Neg Hx   . Rectal cancer Neg Hx     Social History   Socioeconomic History  .  Marital status: Married    Spouse name: Not on file  . Number of children: 1  . Years of education: Not on file  . Highest education level: Not on file  Occupational History  . Occupation: Admin. assistant  Tobacco Use  . Smoking status: Never Smoker  . Smokeless tobacco: Never Used  Vaping Use  . Vaping Use: Never used  Substance and Sexual Activity  . Alcohol use: Yes    Alcohol/week: 1.0 standard drink    Types: 1 Glasses of wine per week    Comment: Moderate  . Drug use: No  . Sexual activity: Yes  Other Topics Concern  . Not on file  Social History Narrative   Pt lives in [place] with husband Lanny Hurst.  Followed by Dr. Clovis Pu for psychiatry and Rinaldo Cloud for therapy.   Social Determinants of Health   Financial Resource Strain: Not on file  Food Insecurity: Not on file  Transportation Needs: Not on file  Physical Activity: Not on file  Stress: Not on file  Social Connections: Not on file  Intimate Partner Violence: Not on file    Outpatient Medications Prior to Visit  Medication Sig Dispense Refill  . atorvastatin  (LIPITOR) 20 MG tablet Take 1 tablet (20 mg total) by mouth at bedtime. 90 tablet 0  . carbamazepine (TEGRETOL) 100 MG chewable tablet CHEW 1 TABLET BY MOUTH AT BEDTIME. 90 tablet 0  . celecoxib (CELEBREX) 50 MG capsule Take 1 capsule (50 mg total) by mouth 2 (two) times daily. 60 capsule 0  . cetirizine (ZYRTEC) 10 MG tablet Take 1 tablet (10 mg total) by mouth daily. 90 tablet 3  . chlordiazePOXIDE (LIBRIUM) 25 MG capsule TAKE (1) CAPSULE BY MOUTH AT BEDTIME AS NEEDED FOR ANXIETY 30 capsule 1  . cholecalciferol (VITAMIN D) 25 MCG (1000 UNIT) tablet Take 1,000 Units by mouth daily.    Marland Kitchen dexmethylphenidate (FOCALIN) 10 MG tablet Take 1.5 tablets (15 mg total) by mouth 2 (two) times daily. 90 tablet 0  . docusate sodium (COLACE) 100 MG capsule Take 100 mg by mouth. 1 tab every other day.    . EQUETRO 200 MG CP12 12 hr capsule Take 1 capsule (200 mg total) by mouth at bedtime. 30 capsule 5  . ferrous gluconate (FERGON) 324 MG tablet Take 324 mg by mouth daily with breakfast.    . Ferrous Sulfate (IRON) 325 (65 Fe) MG TABS 1 tablet    . lamoTRIgine (LAMICTAL) 200 MG tablet TAKE (1) TABLET BY MOUTH TWICE DAILY. 180 tablet 0  . lithium carbonate 150 MG capsule TAKE (1) CAPSULE BY MOUTH ONCE DAILY IN THE AFTERNOON. (Patient taking differently: HS) 90 capsule 0  . pantoprazole (PROTONIX) 40 MG tablet Take 1 tablet (40 mg total) by mouth daily. 90 tablet 2  . VRAYLAR capsule TAKE (1) CAPSULE BY MOUTH ONCE DAILY. (Patient taking differently: Take 1.5 mg by mouth daily.) 30 capsule 0   No facility-administered medications prior to visit.    Allergies  Allergen Reactions  . Azithromycin Anaphylaxis  . Penicillins Anaphylaxis    DID THE REACTION INVOLVE: Swelling of the face/tongue/throat, SOB, or low BP? Yes Sudden or severe rash/hives, skin peeling, or the inside of the mouth or nose? Yes Did it require medical treatment? No When did it last happen? If all above answers are "NO", may proceed  with cephalosporin use.  . Adhesive [Tape] Other (See Comments)    On bandaids    Review of Systems  Constitutional: Positive  for fatigue and fever. Negative for chills.  HENT: Positive for congestion, ear pain, postnasal drip, rhinorrhea, sinus pressure, sinus pain and sore throat.   Respiratory: Positive for cough and wheezing.        Productive  Musculoskeletal: Negative for myalgias.       Objective:    Physical Exam  Ht 5' (1.524 m)   Wt 175 lb (79.4 kg)   BMI 34.18 kg/m  Wt Readings from Last 3 Encounters:  12/22/20 175 lb (79.4 kg)  10/07/20 156 lb 1.9 oz (70.8 kg)  05/25/20 161 lb (73 kg)    There are no preventive care reminders to display for this patient.  There are no preventive care reminders to display for this patient.   Lab Results  Component Value Date   TSH 2.830 03/02/2020   Lab Results  Component Value Date   WBC 6.0 03/02/2020   HGB 13.7 03/02/2020   HCT 40.4 03/02/2020   MCV 92 03/02/2020   PLT 219 03/02/2020   Lab Results  Component Value Date   NA 139 03/02/2020   K 4.5 03/02/2020   CO2 22 03/02/2020   GLUCOSE 83 03/02/2020   BUN 15 03/02/2020   CREATININE 0.76 03/02/2020   BILITOT 0.4 07/22/2020   ALKPHOS 67 07/22/2020   AST 15 07/22/2020   ALT 10 07/22/2020   PROT 6.8 07/22/2020   ALBUMIN 4.3 07/22/2020   CALCIUM 9.4 03/02/2020   ANIONGAP 8 01/19/2020   GFR 69.31 04/04/2019   Lab Results  Component Value Date   CHOL 200 07/22/2020   Lab Results  Component Value Date   HDL 61.10 07/22/2020   Lab Results  Component Value Date   LDLCALC 112 (H) 07/22/2020   Lab Results  Component Value Date   TRIG 131.0 07/22/2020   Lab Results  Component Value Date   CHOLHDL 3 07/22/2020   Lab Results  Component Value Date   HGBA1C 5.2 03/02/2020       Assessment & Plan:   Problem List Items Addressed This Visit      Respiratory   URI (upper respiratory infection)    -will test for strep and flu based on  symptoms -home COVID test was negative -Rx. norel and flonase -will consider abx or tamiflu based on lab results           Meds ordered this encounter  Medications  . Chlorphen-PE-Acetaminophen (NOREL AD) 4-10-325 MG TABS    Sig: Take 1 tablet by mouth every 4 (four) hours as needed (nasal congestion, cold symptoms).    Dispense:  20 tablet    Refill:  1  . fluticasone (FLONASE) 50 MCG/ACT nasal spray    Sig: Use 2 sprays in each nostril BID for a week. After 1 week, decrease to 1 spray in each nostril BID as needed for congestion/allergies.    Dispense:  16 g    Refill:  6   Date:  12/22/2020   Location of Patient: Home Location of Provider: Office Consent was obtain for visit to be over via telehealth. I verified that I am speaking with the correct person using two identifiers.  I connected with  Megan Whitney on 12/22/20 via telephone and verified that I am speaking with the correct person using two identifiers.   I discussed the limitations of evaluation and management by telemedicine. The patient expressed understanding and agreed to proceed.  Time spent: 8 minutes   Noreene Larsson, NP

## 2020-12-22 NOTE — Assessment & Plan Note (Signed)
-  will test for strep and flu based on symptoms -home COVID test was negative -Rx. norel and flonase -will consider abx or tamiflu based on lab results

## 2020-12-23 ENCOUNTER — Ambulatory Visit: Payer: Medicare Other | Admitting: Psychiatry

## 2020-12-24 ENCOUNTER — Other Ambulatory Visit: Payer: Self-pay

## 2020-12-24 ENCOUNTER — Telehealth: Payer: Self-pay

## 2020-12-24 ENCOUNTER — Other Ambulatory Visit: Payer: Self-pay | Admitting: Internal Medicine

## 2020-12-24 ENCOUNTER — Ambulatory Visit (INDEPENDENT_AMBULATORY_CARE_PROVIDER_SITE_OTHER): Payer: Medicare Other

## 2020-12-24 DIAGNOSIS — J019 Acute sinusitis, unspecified: Secondary | ICD-10-CM

## 2020-12-24 DIAGNOSIS — J029 Acute pharyngitis, unspecified: Secondary | ICD-10-CM

## 2020-12-24 DIAGNOSIS — J069 Acute upper respiratory infection, unspecified: Secondary | ICD-10-CM | POA: Diagnosis not present

## 2020-12-24 LAB — POCT RAPID STREP A (OFFICE): Rapid Strep A Screen: NEGATIVE

## 2020-12-24 LAB — POCT INFLUENZA A/B
Influenza A, POC: NEGATIVE
Influenza B, POC: NEGATIVE

## 2020-12-24 MED ORDER — DOXYCYCLINE HYCLATE 100 MG PO TABS: 100.0000 mg | ORAL_TABLET | Freq: Two times a day (BID) | ORAL | 7 refills | Status: DC

## 2020-12-24 NOTE — Progress Notes (Signed)
Sent Doxycycline for acute sinusitis as patient has persistent symptoms with symptomatic treatment. She is allergic to Azithromycin and Penicillins.

## 2020-12-24 NOTE — Telephone Encounter (Signed)
She was advised to get Flu and strep test. Did she get it done?

## 2020-12-24 NOTE — Telephone Encounter (Signed)
Please advise pt saw Donneta Romberg 12-22-20

## 2020-12-24 NOTE — Telephone Encounter (Signed)
Patient came by and had strep swab and flu a/b swab. Both were negative.

## 2020-12-24 NOTE — Telephone Encounter (Signed)
Pt notified with verbal understanding  °

## 2020-12-24 NOTE — Telephone Encounter (Signed)
Pt advised to get flu and strep done she could come here and call us when she got here and we would be happy to do this for her.

## 2020-12-24 NOTE — Telephone Encounter (Signed)
Please advise her that I have sent Doxycycline for her.

## 2020-12-24 NOTE — Telephone Encounter (Signed)
patient called said now she having greenish color out of nose and coughing all night long. She just started taking her meds yesterday that was prescribed.  She asking if Dr Posey Pronto can prescribe an antibiotic?  Call back # [phone].  [facility]: [facility]

## 2020-12-25 ENCOUNTER — Other Ambulatory Visit: Payer: Self-pay | Admitting: Psychiatry

## 2020-12-28 ENCOUNTER — Other Ambulatory Visit: Payer: Self-pay

## 2020-12-28 MED ORDER — CHLORDIAZEPOXIDE HCL 25 MG PO CAPS: 25.0000 mg | ORAL_CAPSULE | ORAL | 1 refills | Status: DC | PRN

## 2020-12-30 ENCOUNTER — Telehealth: Payer: Self-pay

## 2020-12-30 NOTE — Telephone Encounter (Signed)
Please advise her to take symptomatic treatment in addition to completing the antibiotics. Some of her symptoms could be related to allergies, for which she takes Zyrtec and use Flonase regularly.

## 2020-12-30 NOTE — Telephone Encounter (Signed)
Pt advised of recommendation with verbal understanding  

## 2020-12-30 NOTE — Telephone Encounter (Signed)
Pt is on her last pill of antibiotics, still having Nyoka Cowden gunk coming out,  She is going oout of town, and wants to know if she needs something else .  Please call the pt

## 2020-12-31 ENCOUNTER — Ambulatory Visit (INDEPENDENT_AMBULATORY_CARE_PROVIDER_SITE_OTHER): Payer: Medicare Other | Admitting: Psychiatry

## 2020-12-31 DIAGNOSIS — F3132 Bipolar disorder, current episode depressed, moderate: Secondary | ICD-10-CM

## 2020-12-31 NOTE — Progress Notes (Signed)
[facility] Counselor/Therapist Progress Note  Patient ID: Megan Whitney, MRN: [id number],    Date: 12/31/2020  Time Spent:  minutes   11:00am to 11:48am  Virtual Visit via MyChart Video Note Connected with patient by a video enabled telemedicine/telehealth application or telephone, with their informed consent, and verified patient privacy and that I am speaking with the correct person using two identifiers. I discussed the limitations, risks, security and privacy concerns of performing psychotherapy and management service by telephone and the availability of in person appointments. I also discussed with the patient that there may be a patient responsible charge related to this service. The patient expressed understanding and agreed to proceed. I discussed the treatment planning with the patient. The patient was provided an opportunity to ask questions and all were answered. The patient agreed with the plan and demonstrated an understanding of the instructions. The patient was advised to call  our office if  symptoms worsen or feel they are in a crisis state and need immediate contact.   Therapist Location: [facility] Patient Location: home   Treatment Type: Individual Therapy  Reported Symptoms: depression, anxiety, fearful thoughts (what may happen, what's going to happen" , looking for things to go wrong versus right)  Mental Status Exam:  Appearance:   Casual     Behavior:  Appropriate, Sharing and Motivated  Motor:  Normal  Speech/Language:   Clear and Coherent  Affect:  anxious, depressed  Mood:  anxious and depressed  Thought process:  goal directed  Thought content:    some obsessiveness  Sensory/Perceptual disturbances:    WNL  Orientation:  oriented to person, place, time/date, situation, day of week, month of year and year  Attention:  Good  Concentration:  Good and Fair  Memory:  No  Fund of knowledge:   Good  Insight:    Good and Fair  Judgment:    Fair  Impulse Control:  Fair   Risk Assessment: Danger to Self:  No Self-injurious Behavior: No Danger to Others: No Duty to Warn:no Physical Aggression / Violence:No  Access to Firearms a concern: No  Gang Involvement:No   Subjective: Patient today reports anxiety and depression, fearful thoughts. See Progress Note below.   Interventions: Solution-Oriented/Positive Psychology and Ego-Supportive  Diagnosis:   ICD-10-CM   1. Bipolar disorder with moderate depression ([place])  F31.32      Plan: Patient not signing tx plan on computer screen due to Energy.  Treatment Goals: Goals remain on plan as patient works on strategies to meet her goals. Progress is noted each visit in "Progress" section of goal plan.  Long Term Goal: Reduce overall level, frequency, and intensity of the anxiety so that daily functioning is not impaired.  Short Term Goal: 1.Increase understanding of the beliefs and messages that produce the worry and anxiety.  Strategies: 1.Help client develop reality-based positive cognitive messages/self-talk. 2. Develop a "coping card" or other reminder which coping strategies are recorded for patient's later use .  PROGRESS: Patient today reporting anxiety, depression, and fearful thoughts as she imagines worst case scenarios.  Denies any SI and is actually relating well in session today.  Self-care has improved and irritability has lessened.  Anxiety and depression, along with the fearful thoughts are her main concern.  Husband's alcohol abuse continues.  Processed her ongoing frustrations with his alcohol abuse along with trying to manage her anxiety, depression, and fearful thoughts.  Able to realize how her fearful thoughts that tend to sometimes  spiral out of control, are currently feeding her anxiety and depression and we used several examples to illustrate this for patient.  Worked more intently on this in session today, per her long and short-term goal and  strategies in treatment plan above.  Also worked with patient on reframing her fearful thoughts (managing worst case scenarios) and replacing those thoughts with more reality-based thoughts that do not feed her anxiety and depression.  Patient is to continue working with these on a daily basis in between sessions.  Also encouraged her to keep contact with people who are supportive of her, stay involved in some of the women's programs at her church that she has begun, to practice staying in the present focusing on what she can control or change versus cannot, practice improved self talk, keep healthy boundaries with others as needed, let her faith be a source of emotional support, get outside and walk some daily, decrease her overthinking, stop self criticalness, work to make better choices that are healthy for her, and realize the strength and increased efforts she is showing with daily challenges as she tries to move forward in a more positive direction.   Goal review and progress/challenges noted with patient.  Next appt within 2 weeks.   Shanon Ace, LCSW

## 2021-01-06 ENCOUNTER — Ambulatory Visit: Payer: Medicare Other | Admitting: Psychiatry

## 2021-01-11 ENCOUNTER — Other Ambulatory Visit: Payer: Self-pay | Admitting: Internal Medicine

## 2021-01-11 DIAGNOSIS — E7849 Other hyperlipidemia: Secondary | ICD-10-CM

## 2021-01-14 ENCOUNTER — Other Ambulatory Visit: Payer: Self-pay | Admitting: Psychiatry

## 2021-01-14 ENCOUNTER — Ambulatory Visit: Payer: Medicare Other | Admitting: Psychiatry

## 2021-01-14 DIAGNOSIS — Z23 Encounter for immunization: Secondary | ICD-10-CM | POA: Diagnosis not present

## 2021-01-14 DIAGNOSIS — F319 Bipolar disorder, unspecified: Secondary | ICD-10-CM

## 2021-01-15 NOTE — Telephone Encounter (Signed)
Please review

## 2021-01-20 ENCOUNTER — Ambulatory Visit (INDEPENDENT_AMBULATORY_CARE_PROVIDER_SITE_OTHER): Payer: Medicare Other | Admitting: Psychiatry

## 2021-01-20 ENCOUNTER — Other Ambulatory Visit: Payer: Self-pay

## 2021-01-20 DIAGNOSIS — F3132 Bipolar disorder, current episode depressed, moderate: Secondary | ICD-10-CM | POA: Diagnosis not present

## 2021-01-20 NOTE — Progress Notes (Signed)
[facility] Counselor/Therapist Progress Note  Patient ID: Megan Whitney, MRN: [id number],    Date: 01/20/2021  Time Spent: 60 minutes     11:00am to 12:00noon   Treatment Type: Individual Therapy  Reported Symptoms: anxiety, depressed, prior suicidal ideation "but not currently", sadness, and anger.  Mental Status Exam:  Appearance:   Casual     Behavior:  Appropriate, Sharing and Motivated  Motor:  Normal  Speech/Language:   Clear and Coherent  Affect:  Depressed, Tearful and anxious  Mood:  anxious, depressed and sad  Thought process:  goal directed  Thought content:    WNL  Sensory/Perceptual disturbances:    WNL  Orientation:  oriented to person, place, time/date, situation, day of week, month of year and year  Attention:  Fair  Concentration:  Fair  Memory:  [place] of knowledge:   Good  Insight:    Good and Fair  Judgment:   Fair  Impulse Control:  Good and Fair   Risk Assessment: Danger to Self:  No Self-injurious Behavior: No Danger to Others: No Duty to Warn:no Physical Aggression / Violence:No  Access to Firearms a concern: No  Gang Involvement:No   Subjective:  Patient in today reporting anxiety, depression, fearful thoughts of the future, and feeling she can't cope.  Had patient explain more about her thoughts/feelings as to "not being able to cope." States she's had some SI more recently which we processed in more detail, while patient tearfully talked through a lot of her fears, anger, and anxieties. Reports feeling heard and supported. Talked through her difficult feelings including her prior suicidal ideation which she described feeling as a result of husband's continued alcohol abuse and returning home after beach vacation.  Specifically talked about her SI and processed thoughts and feelings as she states she's not experiencing SI now. Talked about this further, carefully listening to her and she does seem honest in committing to not harm  herself and follow through on suggested strategies including better self-care and self talk, using her journal as a tool and working with her depression and anxiety and bring journal in with her to next session.  Good effort and work on part of patient today.  Interventions: Solution-Oriented/Positive Psychology and Ego-Supportive  Diagnosis:   ICD-10-CM   1. Bipolar disorder with moderate depression ([place])  F31.32     Treatment Goal Plan of Care: Patient not signing tx plan on computer screen due to [place]. Treatment Goals: Goals remain on plan as patient works on strategies to meet her goals. Progress is noted each visit in "Progress" section of goal plan. Long Term Goal: Reduce overall level, frequency, and intensity of the anxiety so that daily functioning is not impaired. Short Term Goal: 1.Increase understanding of the beliefs and messages that produce the worry and anxiety. Strategies: 1.Help client develop reality-based positive cognitive messages/self-talk. 2. Develop a "coping card" or other reminder which coping strategies are recorded for patient's later use .     Progress / Plan:  Patient today reports anxiety depression as well as some prior suicidal ideation that we discussed in detail taking a good part of the session today.  Patient responded well and realizing she was not thinking things through well and was making assumptions that just were not true.  Was imagining some worst case scenarios and had not had a lot of contact with friends which led her to become more depressed and have some suicidal ideation.  As noted above  in talking with her at length today, she commits to not harming herself and does seem sincere with that.  She also has specific friends that she can contact that are very supportive of her as well as a couple of relatives.  Also knows that she can contact our offices after hours or go to the emergency room at the hospital if she feels she may need to be  admitted for safety concerns.  Appears much more grounded at end of session and again gives a commitment not to harm herself and reports not having suicidal ideation upon leaving session.  Her affect was more positive and has a number of activities that she feels she can involve herself in that she enjoys including knitting, being outside with her dogs, talking with friends, journaling, and reading.  Also encouraged patient to continue on reframing fearful/anxious thoughts and worst case scenarios, look daily for more positives, stay in contact with people that are supportive of her, continue involvement in some of the women's programs at her church that she recently began, stay focused on the present and what she can control, practice more consistently positive self talk and self-care, keep healthy boundaries with others, let her faith be a source of emotional support as well as spiritual, stop self criticalness, make healthy choices for herself, decrease overthinking, and feel good about the strength she shows and working with goal-directed behaviors to try and move forward despite being in challenging circumstances.  Goal review and progress/challenges noted with patient.  Next appt within 2 weeks.   Shanon Ace, LCSW

## 2021-01-21 ENCOUNTER — Telehealth (INDEPENDENT_AMBULATORY_CARE_PROVIDER_SITE_OTHER): Payer: Medicare Other | Admitting: Psychiatry

## 2021-01-21 ENCOUNTER — Encounter: Payer: Self-pay | Admitting: Psychiatry

## 2021-01-21 DIAGNOSIS — F319 Bipolar disorder, unspecified: Secondary | ICD-10-CM

## 2021-01-21 DIAGNOSIS — F411 Generalized anxiety disorder: Secondary | ICD-10-CM

## 2021-01-21 DIAGNOSIS — G2401 Drug induced subacute dyskinesia: Secondary | ICD-10-CM

## 2021-01-21 DIAGNOSIS — F900 Attention-deficit hyperactivity disorder, predominantly inattentive type: Secondary | ICD-10-CM | POA: Diagnosis not present

## 2021-01-21 DIAGNOSIS — R251 Tremor, unspecified: Secondary | ICD-10-CM

## 2021-01-21 DIAGNOSIS — G3184 Mild cognitive impairment, so stated: Secondary | ICD-10-CM

## 2021-01-21 NOTE — Progress Notes (Signed)
Megan Whitney [id number] Jan 20, 1956 65 y.o.   Video Visit via My Chart  I connected with pt by My Chart and verified that I am speaking with the correct person using two identifiers.   I discussed the limitations, risks, security and privacy concerns of performing an evaluation and management service by My Chart  and the availability of in person appointments. I also discussed with the patient that there may be a patient responsible charge related to this service. The patient expressed understanding and agreed to proceed.  I discussed the assessment and treatment plan with the patient. The patient was provided an opportunity to ask questions and all were answered. The patient agreed with the plan and demonstrated an understanding of the instructions.   The patient was advised to call back or seek an in-person evaluation if the symptoms worsen or if the condition fails to improve as anticipated.  I provided 30 minutes of video time during this encounter.  The patient was located at home and the provider was located office. Session started at 1030 and ended at 11  Subjective:   Patient ID:  Megan Whitney is a 65 y.o. (DOB 02-04-1956) female.   Chief Complaint:  Chief Complaint  Patient presents with  . Follow-up  . Bipolar disorder with moderate depression ([place])    Depression        Associated symptoms include decreased concentration.  Associated symptoms include no suicidal ideas.  Past medical history includes anxiety.   Anxiety Symptoms include decreased concentration and nervous/anxious behavior. Patient reports no confusion, dizziness, nausea, palpitations or suicidal ideas.    Medication Refill Associated symptoms include arthralgias. Pertinent negatives include no nausea or weakness.   Megan Whitney is  follow-up of r chronic mood swings and anxiety and frequent changes in medications.   At visit December 27, 2018.  Focalin XR was increased from 20 mg to 25 mg daily  to help with focus and attention and potentially mood.  When seen February 13, 2019.  In an effort to reduce mood cycling we reduce fluoxetine to 20 mg daily.  At visit August 2020.  No meds were changed.  She continued the following: Focalin XR 25 mg every morning and Focalin 10 mg immediate release daily Equetro 200 mg nightly Fluoxetine 20 mg daily Lamotrigine 200 mg twice daily Lithium 150 mg nightly Vraylar 3 mg daily  She called back November 4 after seeing her therapist stating that she was having some hypomanic symptoms with reduced sleep and increased energy.  This potentiality had been discussed and the decision was made to increase Equetro from 200 mg nightly to 300 mg nightly.  seen August 12, 2019.  Because of balance problems she did not tolerate Equetro 300 mg nightly and it was changed to Equetro 200 mg nightly plus immediate release carbamazepine 100 mg nightly.  Her mood had not been stable enough on Equetro 200 mg nightly alone. Less balance problems with change in CBZ.  seen September 23, 2019.  The following was changed: For bipolar mixed increase CBZ IR to 200 mg HS.  Disc fall and balance risks.For bipolar mixed increase CBZ IR to 200 mg HS.  Disc fall and balance risks.  She called back October 23, 2019 stating she had had another fall and felt it was due to the medication.  Therefore carbamazepine immediate release was reduced from 200 mg nightly to 100 mg nightly.  The Equetro is unchanged.  Last seen November 04, 2019.  The  following was noted:  Better at the moment but balance is still somewhat of a problem.  Started PT to help balance.  Had a fall after tripping on a curb and hit her head on sidewalk.  Got a concussion with nausea and HA and dizziness and light sensitivity.  Not over it.  Concentration problems.  Has gotten back to work after a week.   Mood sx pretty good with some mild depression.  Nothing severe.  Trying to minimize stress and self care as much as  possible.  No manic sx lately and sleeping fairly well.  No racing thoughts.   Working another year and plans to retire but H alcoholic and not sure it will be good to be there all the time. Seeing therapist q 2 weeks.  Therapy helping . Recent serum vitamin D level was determined to be low at 33.  The goal and chronically depressed patient's is in the 50s if possible.  So her vitamin D was increased on August 08, 2018 or thereabouts.  Checked vitamin D level again and this time it was high at 120 and so it was stopped.  She's restarted per PCP at 1000 units daily.  01/06/2020 appointment the following is noted: Still on: Focalin XR 25 mg every morning and Focalin 10 mg immediate release daily Equetro 200 mg nightly Carbamazepine immediate release 100 mg nightly Fluoxetine 20 mg daily Lamotrigine 200 mg twice daily Lithium 150 mg nightly Vraylar 3 mg daily Not good manic.  Angry.  Missed 2 days bc sx.  Last week vacation which didn't go well.  Crying last week and missed a day.  "Pissed off at the whole world" but also depressed and hard to get OOB today.  Everything makes me angry.   Blows up without control.  Then regrets it.  Sleep irregular lately. Finished PT which might have helped some but still balance problems. Plan: Cannot increase carbamazepine due to balance issues Temporarily Ativan for agitation 0.5 mg tablets  DT mania stop fluoxetine If fails trial loxapine  01/15/2020 patient called after hours with suicidal thoughts and patient was to go to the [facility]. Patient ultimately admitted to [facility] unit.  Dr. Clovis Pu spoke with clinical pharmacist they are giving history of medication experience and recommendation for loxapine.  Patient hospital stay for 3 days and discharged on loxapine 10 mg nightly as the new medication.  02/10/2020 phone call patient complaining of insomnia.  Loxapine was increased from 10 to 20 mg nightly due  to recent insomnia with mania.  02/14/2020 appointment with the following noted: Lately in tears Monday and Tuesday convinced she couldn't do her job.  Better last couple of days.  Motivation is not real good but not depressed like Monday and Tuesday. This week missing some meds bc couldn't get like Focalin.  Been taking other meds. No sig manic sx.  Sleep is better with more loxapine about 8 hours. Anxiety is chronic.  No SE loxapine so far unless a little dizzy here and there. No med changes.  02/25/2020 appointment urgently made after patient was recently hospitalized.  The following is noted: Unstable.  Today manic driving erratically.  Talking a mile a minute.  Not thinking clearly.  Angry.  Slept OK last night.  Hyperactive with poor productivity for a couple of days.  Weekend OK overall.   No falls lately. More tremor lately.  Retiring July 30.  Plan: For tremor amantadine 100 mg twice  a day if needed. Increase loxapine to 3 capsules 1 to 2 hours before bedtime Reduce Vraylar to 1.5 mg daily or 3 mg every other day.   04/01/2020 appointment with the following noted: Amantadine hs caused NM. Low grade depression for a couple of weeks.  Not severe.   Extended work date 06/04/20 to retire date.  She feels OK about it in some ways but doesn't feel fully up to it.  Doesn't remember when hypomania resolved from last visit.   Sleep is much better now uninterrupted. Hard to remember lithium at lunch. Still has tremor but better with amantadine.  Anxiety still through the roof. Plan: Increase loxapine 40 mg HS.  05/04/20 appt with the following noted:  Increased loxapine to 40.  Anxiety no better.  All kinds of reasons including worry about retirement and paying for things, but worry is probably exaggerated and H say sit is. Sleep good usually.  No SE noted.  Not making her sleep more with change. Still some manic sx including shortly after last visit and then depressed until the last week.   Irritable and angry. Some panic with SOB and fear of MI. Plan: Continue Vraylar 1.5 mg every day (conisder reduction) Increase loxapine to 50 mg daily for 1 week and if no improvement then increase to 75 mg each night (or 3 of the 25 mg capsules)  Multiple phone calls between appointments with the patient complaining loxapine was causing insomnia.  She has adjusted on timing and dose as she felt it was necessary to make it tolerable because when she takes it in the morning she gets sleepy if she takes very much.  06/09/20 appt Noted: Max tolerated loxapine 25 mg BID.  More than that HS gives strange dreams and difficult to go back to sleep and more in AM too sedated. Not doing well.  Anxiety through the roof.  Did ok with vacation but home worries about everything.   Retired.  Has a lot of time to generally worry.  Started reading again for the first time in awhile.  That's helpful. Takes a while to adjust to retirement.  Anxiety and depression feed each other.  Less interest in some activities.  Later in afternoon is not quite as anxious. Hard to drive with anxiety.   Plan: Reduce to see if it helps reduce anxiety.  Focalin XR 20 mg every morning  and stop Focalin 10 mg immediate release daily Equetro 200 mg nightly Carbamazepine immediate release 100 mg nightly Lamotrigine 200 mg twice daily Lithium 150 mg nightly Continue Vraylar 1.5 mg every day (conisder reduction) continue loxapine to 25 mg BID for longer trial.  07/07/20 appt with the following noted: Tearful and overwhelmed  By [facility] dx of prostate CA with mets bones and nodes with plans for hormone tx and radiation and chemotherapy.  Found out about 3 weeks ago.   He's in sig pain and she's caregiving.  Hard for him to walk even on walker.  Is falling to pieces but realizes it's typical but bc bipolar may be affecting her harder.  Tearful a lot.  Forgetting things, distracted, personal routine disrupted. She still feels the focalin is  helpful.  Poor sleep last night bc H but usually 7-8 hours. No effect noticed from Amantadine for tremor. CO more depressed. Plan: Option treat tremor.  change amanatadine 100 mg AM to pramipexole to try to help tremor and mood off label.  Disc risk mania.  She wants to do it.Marland Kitchen  07/14/2020 phone call:Sue called to report that she will be starting Medicare as of January, 2022.  She will be on regular medicare A&B and prescription plan D.  Her Vraylar and Moss Mc will NOT be covered by medicare.  She needs to know if there are other medications to replace these.  The cost for these medications is over $6000 and she can't afford that price.  She has an appt 12/2, but needs to know asap if there are going to be alternate medications and what they are so she can check on coverage. MD response: There are no reasonable alternatives to these medications that will work in the same way.  She needs to get a better Medicare D plan that will cover the Vraylar and Equetro or her psychiatric symptoms will get worse if she stops these medications.  There are better Medicare D plans that we will cover these medicines but obviously those plans are more expensive but I can have no control over that.  08/06/2020 appointment with the following noted: Tremor no better and maybe worse with switch from to pramipexole 0.125 mg BID from Amantadine.  No SE. Depressed and anxious and crying a lot.  Hard to tell if related to H.  Anxiety definitely related to H.  H can't do very much bc pain and on pain meds and anemic.  Transfusion yesterday.  H can't drive or shop.  Too weak.  Says she can't find a medicare plan that will cover Equetro and [employer]. Plan: She wants to continue 10 mg immediate release Focalin daily but try skipping to see if anxiety is better. Increase pramipexole to try to help tremor and mood off label.  Disc risk mania.  She wants to do it.. Increase to 0.5 mg BID.  09/07/2020 appointment with the following  noted: At last appointment patient was more depressed and anxious and complaining of tremor.  Additional stress with husband's cancer and poor health. Severe anger problems with 0.5 mg BID and mood swings on pramipexole after a week.  Reduced to 0.5 mg AM and still having the problem. Helped tremor tremdously at the higher dose and worse with lower dose.  Tremor same all day except worse with stress.   Stopped Focalin IR without change. Things have been tough and dealing with depression.  H's cancer really affecting me.  Causing depression and anxiety and often in tears.  Able to care for herself and H.  He doesn't require a lot of care but she's not strong emotionally.   Now on [facility] and worry over med coverage. Plan: So wean and stop it loxapine due to NR and intolerance of higher dose.    09/11/2020 phone call that new Medicare plan would not cover Focalin XR and it was switched to Focalin 10 mg twice daily.  Also informed of high cost of Vraylar with new plan. MD response: As I told her at the last visit, there is nothing similar to Vraylar that is generic.  That is why I suggested she select an insurance plan that would cover it.. Reduce Vraylar that she has remaining to 1 every 3rd day until she runs out.  She may feel OK for awhile without it bc it gets out of the body slowly. We'll see how she's doing at her visit next month   09/25/2020 phone call from patient saying she was more depressed since tapering off the Vraylar including disorganized thinking and lack of motivation. MD response: Pt got some samples.  However  she was warned before switch to Medicare to make sure plan adequately covered Vraylar.   She didn't do this.   We tried all reasonable alternatives to Vraylar which either failed or caused intolerable SE.  I  cannot fix this problem for her.  She will inevitably worsen when she stops an effective tolerated med.  10/07/2020 patient called back stating she wanted to restart  loxapine.  10/19/2020 appointment with the following noted: Says none of Medicare D plans cover Vraylar except with high copay of $400/month. Won't be able to stay on it but is taking some of the Vraylar now.   Currently on Vraylar 1.5 mg daily but that won't last and she'll have to stop it.  Has cut back and feels more depressed markedly. She decided the loxapine was helping some and wanted to restart loxapine 25 mg in AM.  Makes her sleepy.    Paying $90 monthly for Equetro. But had balance probles with CBZ ER. Wasn't taking lithium for a long while and restarted 150 mg HS. Wants to stay on librium 25 mg HS bc it helps sleep but insurance won't pay for it either. Plan: Switch   Focalin XR 20 mg  To IR 15 mg BID DT Cost and off label for depression   Continue the Vraylar as long as she can until she runs out. Pending neurology evaluation  11/16/2020 Telephone call with [facility] neurology PA that saw the patient today.  Reviewed the long unstable history of bipolar disorder and multiple previous med trials.  Neurology see some EPS likely related to [place]ma.  However they also would like to consider either Ingrezza or Austedo given her multiple failures of meds for tremor and EPS.  They suspect some TD type symptoms.  They will discuss this with the patient. Discussed the neurology evaluation at length.  The note is not accessible at this time in epic. Kofi A. Doonquah, MD noted at time tremor was minimal but suspected EPS and TD DT toes wiggling and teeth grinding. We will defer any changes such as Austedo or Ingrezza because of the risk of worsening parkinsonism until the patient is stable on Vraylar dosing.  11/17/2020 appointment with the following noted: Frustrated tremor got better in the last week for no apparent reason. [employer] gave them money so taking the [employer] daily for 3 weeks and it's a "huge difference" with depression much better but not gone. So stopped loxapine.   12/21/2020  appointment with the following noted:  Able to stay on Vraylar 1.5 mg daily but still having depression and hard to function.  Not sure why that is unless dealing with H's cancer.  H had some good news with pending bone scan and Cat scan.  Now he's having a lot of pain even on pain meds.  He's also started drinking again and that worries her.  Therefore worried.   Retired.  So mind is freer to worry but trying to stay active.   Tolerating the meds well.  Tremor is better than it was, but worse with stress.   Hygiene is not as good as usual for showering. Able to stay on Focalin 15 mg BID usually.  No SE other than tremor. Sleep is pretty good usually. Plan: No med changes.  She is having to use Vraylar samples because of the cost of the medicine.  01/21/2021 appointment with the following noted: Able to purchase Vraylar and samples to spread it out.  $327/30 caps. Taking 1.5 mg daily.  Suffering depression still.  SI last week and so depressed.   2 nights ago ? Manic yelling, cursing and screaming for several hours and evened out the next day seeing therapist. SE seem pretty well with minimal tremors.  Still mouth movements about the same.  Grimaces a good amount.   Thinks she is rapid cycling.  Past Psychiatric Medication Trials: Vraylar 4.5 SE mouth movements reduced to 3 mg 3/20. It was effective at lower doses for depression.  Worse off it.  Vraylar 1.5 mg every third day led to relapse of significant depression. lithium 150,   Trileptal 450, Depakote, Equetro 300 balance issues, CBZ ER falling,   Lamictal 200 twice daily, Latuda 80, , olanzapine, Seroquel, risperidone, Abilify, loxapine 25 mg BID (max tolerated) Focalin,  Ritalin,  fluoxetine 60,  buspirone, sertraline 100, Wellbutrin history of facial tics, paroxetine cognitive side effects ropinirole, amantadine, Sinemet, Artane, Cogentin,  pramipexole 0.5 mg BID helped tremor but caused anger trazodone hangover, Ambien  hangover,  Review of Systems:  Review of Systems  HENT: Positive for dental problem and tinnitus.        Chirping cricket sounds in hears since January  Cardiovascular: Negative for palpitations.  Gastrointestinal: Negative for nausea.  Musculoskeletal: Positive for arthralgias. Negative for gait problem.  Neurological: Positive for tremors. Negative for dizziness, seizures, syncope, weakness and light-headedness.       Still balance problems. No falls lately. Tremor is worse in hands  Psychiatric/Behavioral: Positive for decreased concentration, depression and dysphoric mood. Negative for agitation, behavioral problems, confusion, hallucinations, self-injury, sleep disturbance and suicidal ideas. The patient is nervous/anxious. The patient is not hyperactive.   No falls since here. Not currently depressed but unable to remove this from the list.  Medications: I have reviewed the patient's current medications.  Current Outpatient Medications  Medication Sig Dispense Refill  . atorvastatin (LIPITOR) 20 MG tablet TAKE (1) TABLET BY MOUTH AT BEDTIME. 90 tablet 0  . carbamazepine (TEGRETOL) 100 MG chewable tablet CHEW 1 TABLET BY MOUTH AT BEDTIME. 90 tablet 0  . chlordiazePOXIDE (LIBRIUM) 25 MG capsule Take 1 capsule (25 mg total) by mouth as needed for anxiety. 30 capsule 1  . cholecalciferol (VITAMIN D) 25 MCG (1000 UNIT) tablet Take 1,000 Units by mouth daily.    Marland Kitchen dexmethylphenidate (FOCALIN) 10 MG tablet Take 1.5 tablets (15 mg total) by mouth 2 (two) times daily. 90 tablet 0  . EQUETRO 200 MG CP12 12 hr capsule TAKE (1) CAPSULE BY MOUTH AT BEDTIME. 30 capsule 0  . ferrous gluconate (FERGON) 324 MG tablet Take 324 mg by mouth daily with breakfast.    . lamoTRIgine (LAMICTAL) 200 MG tablet TAKE (1) TABLET BY MOUTH TWICE DAILY. 180 tablet 0  . lithium carbonate 150 MG capsule TAKE (1) CAPSULE BY MOUTH ONCE DAILY IN THE AFTERNOON. (Patient taking differently: HS) 90 capsule 0  . Loratadine  10 MG CAPS Take by mouth.    . pantoprazole (PROTONIX) 40 MG tablet Take 1 tablet (40 mg total) by mouth daily. 90 tablet 2  . VRAYLAR 1.5 MG capsule TAKE (1) CAPSULE BY MOUTH ONCE DAILY. 30 capsule 0  . celecoxib (CELEBREX) 50 MG capsule Take 1 capsule (50 mg total) by mouth 2 (two) times daily. (Patient not taking: Reported on 01/21/2021) 60 capsule 0  . cetirizine (ZYRTEC) 10 MG tablet Take 1 tablet (10 mg total) by mouth daily. (Patient not taking: Reported on 01/21/2021) 90 tablet 3  . Chlorphen-PE-Acetaminophen (NOREL AD) 4-10-325 MG TABS Take  1 tablet by mouth every 4 (four) hours as needed (nasal congestion, cold symptoms). (Patient not taking: Reported on 01/21/2021) 20 tablet 1  . docusate sodium (COLACE) 100 MG capsule Take 100 mg by mouth. 1 tab every other day. (Patient not taking: Reported on 01/21/2021)    . doxycycline (VIBRA-TABS) 100 MG tablet Take 1 tablet (100 mg total) by mouth 2 (two) times daily. (Patient not taking: Reported on 01/21/2021) 14 tablet 7  . fluticasone (FLONASE) 50 MCG/ACT nasal spray Use 2 sprays in each nostril BID for a week. After 1 week, decrease to 1 spray in each nostril BID as needed for congestion/allergies. (Patient not taking: Reported on 01/21/2021) 16 g 6   No current facility-administered medications for this visit.    Medication Side Effects: Other: tremor and weight gain.  Dyskinesia appears better  SE bettter than they were.  Balance problems intermittently  Allergies:  Allergies  Allergen Reactions  . Azithromycin Anaphylaxis  . Penicillins Anaphylaxis    DID THE REACTION INVOLVE: Swelling of the face/tongue/throat, SOB, or low BP? Yes Sudden or severe rash/hives, skin peeling, or the inside of the mouth or nose? Yes Did it require medical treatment? No When did it last happen? If all above answers are "NO", may proceed with cephalosporin use.  . Adhesive [Tape] Other (See Comments)    On bandaids    Past Medical History:   Diagnosis Date  . ADD (attention deficit disorder)   . Allergy    Seasonal  . Anemia    History of GI blood loss  . Anxiety   . Arthritis   . Bipolar 1 disorder ([place])   . Cancer ([place])   . Colon polyps   . Depression   . Diabetes mellitus ([place])   . Edema, lower extremity   . Epistaxis    Around 2011 or 2012, required cauterization.   . Esophageal stricture   . Fracture of superior pubic ramus ([place]) 11/28/2018  . GERD (gastroesophageal reflux disease)   . Headache(784.0)   . Hyperlipidemia   . Interstitial cystitis   . Joint pain   . Lactose intolerance   . Lung cancer ([place]) 2002  . Obesity   . Osteoarthritis   . Palpitations   . Sleep apnea    Doesn't use a CPAP  . Swallowing difficulty     Family History  Problem Relation Age of Onset  . Arthritis Mother   . Hearing loss Mother   . Hyperlipidemia Mother   . Hypertension Mother   . Depression Mother   . Anxiety disorder Mother   . Obesity Mother   . Sudden death Mother   . Hypertension Father   . Diabetes Mellitus II Father   . Heart disease Father   . Arthritis Father   . Cancer Father        Brain  . COPD Father   . Diabetes Father   . Hyperlipidemia Father   . Sleep apnea Father   . Early death Sister        Aneroxia/Bulimic  . Depression Brother   . Early death Investment banker, corporate  . Depression Daughter   . Drug abuse Daughter   . Heart disease Daughter   . Hypertension Daughter   . Stroke Maternal Grandmother   . Hypertension Maternal Grandmother   . Arthritis Maternal Grandfather   . Heart attack Maternal Grandfather   . Hearing loss Maternal Grandfather   . Colon cancer  Neg Hx   . Esophageal cancer Neg Hx   . Rectal cancer Neg Hx     Social History   Socioeconomic History  . Marital status: Married    Spouse name: Not on file  . Number of children: 1  . Years of education: Not on file  . Highest education level: Not on file  Occupational History  . Occupation: Admin.  assistant  Tobacco Use  . Smoking status: Never Smoker  . Smokeless tobacco: Never Used  Vaping Use  . Vaping Use: Never used  Substance and Sexual Activity  . Alcohol use: Yes    Alcohol/week: 1.0 standard drink    Types: 1 Glasses of wine per week    Comment: Moderate  . Drug use: No  . Sexual activity: Yes  Other Topics Concern  . Not on file  Social History Narrative   Pt lives in [place] with husband Lanny Hurst.  Followed by Dr. Clovis Pu for psychiatry and Rinaldo Cloud for therapy.   Social Determinants of Health   Financial Resource Strain: Not on file  Food Insecurity: Not on file  Transportation Needs: Not on file  Physical Activity: Not on file  Stress: Not on file  Social Connections: Not on file  Intimate Partner Violence: Not on file    Past Medical History, Surgical history, Social history, and Family history were reviewed and updated as appropriate.   Please see review of systems for further details on the patient's review from today.   Objective:   Physical Exam:  There were no vitals taken for this visit.  Physical Exam Neurological:     Mental Status: She is alert and oriented to person, place, and time.     Cranial Nerves: No dysarthria.     Motor: Tremor present.     Comments: Mild resting R hand rotational tremor Fidgety mildy with feet Very mild lip pursing.  Psychiatric:        Attention and Perception: Attention and perception normal.        Mood and Affect: Mood is anxious and depressed. Affect is not tearful.        Speech: Speech normal.        Behavior: Behavior is cooperative.        Thought Content: Thought content normal. Thought content is not paranoid or delusional. Thought content does not include homicidal or suicidal ideation. Thought content does not include homicidal or suicidal plan.        Cognition and Memory: Cognition and memory normal.        Judgment: Judgment normal.     Comments: Insight intact More depressed but also  cycling.     Lab Review:     Component Value Date/Time   NA 139 03/02/2020 1433   K 4.5 03/02/2020 1433   CL 103 03/02/2020 1433   CO2 22 03/02/2020 1433   GLUCOSE 83 03/02/2020 1433   GLUCOSE 102 (H) 01/19/2020 1158   BUN 15 03/02/2020 1433   CREATININE 0.76 03/02/2020 1433   CALCIUM 9.4 03/02/2020 1433   PROT 6.8 07/22/2020 1338   PROT 6.4 03/02/2020 1433   ALBUMIN 4.3 07/22/2020 1338   ALBUMIN 4.3 03/02/2020 1433   AST 15 07/22/2020 1338   ALT 10 07/22/2020 1338   ALKPHOS 67 07/22/2020 1338   BILITOT 0.4 07/22/2020 1338   BILITOT 0.2 03/02/2020 1433   GFRNONAA 83 03/02/2020 1433   GFRAA 96 03/02/2020 1433       Component Value Date/Time  WBC 6.0 03/02/2020 1433   WBC 7.4 01/19/2020 1158   RBC 4.39 03/02/2020 1433   RBC 4.19 01/19/2020 1158   HGB 13.7 03/02/2020 1433   HCT 40.4 03/02/2020 1433   PLT 219 03/02/2020 1433   MCV 92 03/02/2020 1433   MCH 31.2 03/02/2020 1433   MCH 30.3 01/19/2020 1158   MCHC 33.9 03/02/2020 1433   MCHC 32.1 01/19/2020 1158   RDW 12.6 03/02/2020 1433   LYMPHSABS 1.6 03/02/2020 1433   MONOABS 0.7 04/04/2019 1004   EOSABS 0.2 03/02/2020 1433   BASOSABS 0.0 03/02/2020 1433  Vitamin D level 33 on 10K units daily on 12/4/`9 Increased to prescription vitamin d 50K units Monday, Wed, Friday.  Rx sent in.   Lithium Lvl  Date Value Ref Range Status  10/21/2018 0.18 (L) 0.60 - 1.20 mmol/L Final    Comment:    Performed at [facility], [address] [place], [place] [address]     No results found for: PHENYTOIN, PHENOBARB, VALPROATE, CBMZ   .res Assessment: Plan:    Bipolar I disorder with rapid cycling ([place])  Generalized anxiety disorder  Attention deficit hyperactivity disorder (ADHD), predominantly inattentive type  Tremor of both hands  Mild cognitive impairment  Tardive dyskinesia  Clevie has chronic rapid cycling bipolar disorder which is chronically unstable and has been difficult to control.  The rapid cycling is  making it difficult to control frequency of depressive episodes and the anxiety as well.  We have typically had to make frequent med changes.  We have had to reduce mood stabilizer dosages including Vraylar and Equetro she is very sensitive to carbamazepine because higher doses cause dizziness.     More depressed with less Vraylar.  No easy solution to this.  She's tried all FDA approved meds for bipolar depression except Caplyta which she can't afford either.  Failed many other meds too. Disc ECT.  She was denie patient assistance with Vraylar. Currently on 1.5 mg daily. Mouth movements tolerable.  Some grinding of teeth during the day.  She is more depressed at the moment but it is likely that the added stress with her husband is a contributing factor and unlikely that any immediate med change is going to help.  She is failed multiple prior meds for depression.  Caplyta is the only reasonable alternative and as mentioned is going to be expensive and probably unobtainable. So there will be no med changes today.  Wear a mouthguard bc of teeth grinding.    Since last year when she was stable she started having hypomanic symptoms again.  We increased Equetro back to 300 mg nightly but she she has had balance problems. It had stopped the manic symptoms but then she started having balance problems again and we had to switch it back to Equetro 300 and add carbamazepine immediate release 100 nightly..  Out of work ADD less of a problem.  Discussed the risk of stimulants including that that could contribute to mood instability and mood swings.    She has a high residual anxiety.  It has been impossible to control all of her symptoms simultaneously without causing side effects.  She agrees.  Continue the following Discussed side effects of each medicine. Continue   Focalin XR 20 mg  To IR 15 mg BID DT Cost and off label for depression   Equetro 200 mg nightly Carbamazepine immediate release 100 mg  nightly Lamotrigine 200 mg twice daily Lithium 150 mg nightly Increase Vraylar to 1.5mg   alternating with 3 mg every other day to improve recent depressive and manic sx. Disc risk worsening TD and tremor.    If cannot get some meds covered will have marked relapse risks DT multiple other med failures and intolerances.  Discussed potential metabolic side effects associated with atypical antipsychotics, as well as potential risk for movement side effects. Advised pt to contact office if movement side effects occur.  She may be having some mild TD with toe movement and grimacing.  Disc this in detail.  At this point it's tolerable.  .   Disc SE meds and this is heightened by the complication of necessary polypharmacy.  Supportive therapy in terms of dealing with husband's addiction and now new dx metastatic prostate CA.Marland Kitchen  She's interested in Bipolar Support Group and info given.  Requires frequent FU DT chronic instability.  FU 8 weeks  Lynder Parents MD, DFAPA  Please see After Visit Summary for patient specific instructions.    Future Appointments  Date Time Provider [place]  02/04/2021  1:00 PM Lindell Spar, MD [facility]  02/05/2021 10:00 AM Shanon Ace, LCSW [facility] None  02/11/2021 11:00 AM Shanon Ace, LCSW [facility] None  02/18/2021  8:00 AM Shanon Ace, LCSW [facility] None  02/24/2021 10:30 AM Cottle, Billey Co., MD [facility] None  02/25/2021  8:00 AM Shanon Ace, LCSW [facility] None  06/11/2021 10:15 AM Felipa Furnace, DPM [facility]    No orders of the defined types were placed in this encounter.     -------------------------------

## 2021-01-25 DIAGNOSIS — G2401 Drug induced subacute dyskinesia: Secondary | ICD-10-CM | POA: Diagnosis not present

## 2021-01-25 DIAGNOSIS — Z79899 Other long term (current) drug therapy: Secondary | ICD-10-CM | POA: Diagnosis not present

## 2021-01-25 DIAGNOSIS — R278 Other lack of coordination: Secondary | ICD-10-CM | POA: Diagnosis not present

## 2021-01-25 DIAGNOSIS — G20 Parkinson's disease: Secondary | ICD-10-CM | POA: Diagnosis not present

## 2021-01-26 ENCOUNTER — Other Ambulatory Visit: Payer: Self-pay | Admitting: Psychiatry

## 2021-01-26 DIAGNOSIS — F314 Bipolar disorder, current episode depressed, severe, without psychotic features: Secondary | ICD-10-CM

## 2021-01-26 NOTE — Telephone Encounter (Signed)
Last filled 12/12/20 follow up not scheduled yet.seen on 5/19

## 2021-01-28 DIAGNOSIS — Z7689 Persons encountering health services in other specified circumstances: Secondary | ICD-10-CM | POA: Diagnosis not present

## 2021-01-28 DIAGNOSIS — G4730 Sleep apnea, unspecified: Secondary | ICD-10-CM | POA: Diagnosis not present

## 2021-01-28 DIAGNOSIS — E7849 Other hyperlipidemia: Secondary | ICD-10-CM | POA: Diagnosis not present

## 2021-01-29 LAB — CMP14+EGFR
ALT: 13 IU/L (ref 0–32)
AST: 16 IU/L (ref 0–40)
Albumin/Globulin Ratio: 1.7 (ref 1.2–2.2)
Albumin: 4 g/dL (ref 3.8–4.8)
Alkaline Phosphatase: 64 IU/L (ref 44–121)
BUN/Creatinine Ratio: 32 — ABNORMAL HIGH (ref 12–28)
BUN: 28 mg/dL — ABNORMAL HIGH (ref 8–27)
Bilirubin Total: 0.2 mg/dL (ref 0.0–1.2)
CO2: 20 mmol/L (ref 20–29)
Calcium: 9.1 mg/dL (ref 8.7–10.3)
Chloride: 104 mmol/L (ref 96–106)
Creatinine, Ser: 0.87 mg/dL (ref 0.57–1.00)
Globulin, Total: 2.4 g/dL (ref 1.5–4.5)
Glucose: 91 mg/dL (ref 65–99)
Potassium: 4.6 mmol/L (ref 3.5–5.2)
Sodium: 139 mmol/L (ref 134–144)
Total Protein: 6.4 g/dL (ref 6.0–8.5)
eGFR: 74 mL/min/{1.73_m2} (ref >59)

## 2021-01-29 LAB — CBC WITH DIFFERENTIAL/PLATELET
Basophils Absolute: 0.1 10*3/uL (ref 0.0–0.2)
Basos: 1 %
EOS (ABSOLUTE): 0.3 10*3/uL (ref 0.0–0.4)
Eos: 3 %
Hematocrit: 40.4 % (ref 34.0–46.6)
Hemoglobin: 13.2 g/dL (ref 11.1–15.9)
Immature Grans (Abs): 0 10*3/uL (ref 0.0–0.1)
Immature Granulocytes: 0 %
Lymphocytes Absolute: 3.5 10*3/uL — ABNORMAL HIGH (ref 0.7–3.1)
Lymphs: 41 %
MCH: 29.5 pg (ref 26.6–33.0)
MCHC: 32.7 g/dL (ref 31.5–35.7)
MCV: 90 fL (ref 79–97)
Monocytes Absolute: 0.8 10*3/uL (ref 0.1–0.9)
Monocytes: 10 %
Neutrophils Absolute: 3.8 10*3/uL (ref 1.4–7.0)
Neutrophils: 45 %
Platelets: 231 10*3/uL (ref 150–450)
RBC: 4.47 x10E6/uL (ref 3.77–5.28)
RDW: 13 % (ref 11.7–15.4)
WBC: 8.4 10*3/uL (ref 3.4–10.8)

## 2021-01-29 LAB — LIPID PANEL
Chol/HDL Ratio: 2.5 ratio (ref 0.0–4.4)
Cholesterol, Total: 211 mg/dL — ABNORMAL HIGH (ref 100–199)
HDL: 86 mg/dL (ref >39)
LDL Chol Calc (NIH): 113 mg/dL — ABNORMAL HIGH (ref 0–99)
Triglycerides: 65 mg/dL (ref 0–149)
VLDL Cholesterol Cal: 12 mg/dL (ref 5–40)

## 2021-01-29 LAB — HEMOGLOBIN A1C
Est. average glucose Bld gHb Est-mCnc: 111 mg/dL
Hgb A1c MFr Bld: 5.5 % (ref 4.8–5.6)

## 2021-01-29 LAB — TSH: TSH: 2.13 u[IU]/mL (ref 0.450–4.500)

## 2021-01-29 LAB — VITAMIN D 25 HYDROXY (VIT D DEFICIENCY, FRACTURES): Vit D, 25-Hydroxy: 57 ng/mL (ref 30.0–100.0)

## 2021-01-30 ENCOUNTER — Other Ambulatory Visit: Payer: Self-pay | Admitting: Psychiatry

## 2021-01-30 DIAGNOSIS — F319 Bipolar disorder, unspecified: Secondary | ICD-10-CM

## 2021-02-04 ENCOUNTER — Encounter: Payer: Self-pay | Admitting: Internal Medicine

## 2021-02-04 ENCOUNTER — Ambulatory Visit (INDEPENDENT_AMBULATORY_CARE_PROVIDER_SITE_OTHER): Payer: Medicare Other | Admitting: Internal Medicine

## 2021-02-04 ENCOUNTER — Other Ambulatory Visit: Payer: Self-pay

## 2021-02-04 VITALS — BP 127/83 | HR 98 | Temp 99.4°F | Resp 18 | Ht 60.0 in | Wt 174.4 lb

## 2021-02-04 DIAGNOSIS — F319 Bipolar disorder, unspecified: Secondary | ICD-10-CM

## 2021-02-04 DIAGNOSIS — G4730 Sleep apnea, unspecified: Secondary | ICD-10-CM | POA: Diagnosis not present

## 2021-02-04 DIAGNOSIS — N3000 Acute cystitis without hematuria: Secondary | ICD-10-CM | POA: Diagnosis not present

## 2021-02-04 DIAGNOSIS — Z0000 Encounter for general adult medical examination without abnormal findings: Secondary | ICD-10-CM | POA: Insufficient documentation

## 2021-02-04 DIAGNOSIS — K635 Polyp of colon: Secondary | ICD-10-CM | POA: Insufficient documentation

## 2021-02-04 DIAGNOSIS — E782 Mixed hyperlipidemia: Secondary | ICD-10-CM

## 2021-02-04 DIAGNOSIS — G20 Parkinson's disease: Secondary | ICD-10-CM

## 2021-02-04 LAB — POCT URINALYSIS DIP (CLINITEK)
Bilirubin, UA: NEGATIVE
Glucose, UA: NEGATIVE mg/dL
Ketones, POC UA: NEGATIVE mg/dL
Leukocytes, UA: NEGATIVE
Nitrite, UA: NEGATIVE
POC PROTEIN,UA: NEGATIVE
Spec Grav, UA: 1.025 (ref 1.010–1.025)
Urobilinogen, UA: 0.2 E.U./dL
pH, UA: 6 (ref 5.0–8.0)

## 2021-02-04 MED ORDER — NITROFURANTOIN MONOHYD MACRO 100 MG PO CAPS: 100.0000 mg | ORAL_CAPSULE | Freq: Two times a day (BID) | ORAL | 0 refills | Status: DC

## 2021-02-04 NOTE — Assessment & Plan Note (Signed)
Had referred to Neurology for sleep study Denies using CPAP, opted not to have sleep study

## 2021-02-04 NOTE — Assessment & Plan Note (Signed)
Annual exam as documented. Counseling done  re healthy lifestyle involving commitment to 150 minutes exercise per week, heart healthy diet, and attaining healthy weight.The importance of adequate sleep also discussed. Changes in health habits are decided on by the patient with goals and time frames  set for achieving them. Immunization and cancer screening needs are specifically addressed at this visit. 

## 2021-02-04 NOTE — Assessment & Plan Note (Signed)
On Lithium, Vraylar, Tegretol, Lamictal, Librium, Equetro and Focalin Follows up with Psychiatry

## 2021-02-04 NOTE — Progress Notes (Signed)
Established Patient Office Visit  Subjective:  Patient ID: Megan Whitney, female    DOB: 22-Apr-1956  Age: 65 y.o. MRN: [id number]  CC:  Chief Complaint  Patient presents with  . Annual Exam    Annual exam has urgency when urinating no pain has frequency has been going on for about 3 days     HPI MALVA DIESING  is a 65 year old female with PMH of bipolar disorder, ADD, anxiety, OSA, GERD, HLD and osteopenia who presents for annual physical.  Bipolar disorder: She is on multiple medications for bipolar disorder and ADD. She follows up with Psychiatrist and has a therapist, who she visits every week.  Sleep apnea: She states that she does not want to use CPAP device. So she opted not to have sleep study.  Parkinsonism: She had visit with Neurologist recently. She was advised to get CT scan for initial evaluation, but she denied. She states that she would think about it and get it done later. She was not started on any medication by Neurologist.  She has been having urinary frequency and urgency for last 3 days. Denies any fever, chills, nausea or vomiting. Denies dysuria or hematuria.  Past Medical History:  Diagnosis Date  . ADD (attention deficit disorder)   . Allergy    Seasonal  . Anemia    History of GI blood loss  . Anxiety   . Arthritis   . Atrophy of vagina 10/07/2020  . Bipolar 1 disorder ([place])   . Cancer ([place])   . Colon polyps   . Depression   . Diabetes mellitus ([place])   . Edema, lower extremity   . Epistaxis    Around 2011 or 2012, required cauterization.   . Esophageal stricture   . Fracture of superior pubic ramus ([place]) 11/28/2018  . GERD (gastroesophageal reflux disease)   . Headache(784.0)   . Hyperlipidemia   . Interstitial cystitis   . Joint pain   . Lactose intolerance   . Lung cancer ([place]) 2002  . Obesity   . Osteoarthritis   . Palpitations   . Sleep apnea    Doesn't use a CPAP  . Suicidal ideation 01/20/2020  . Swallowing difficulty      Past Surgical History:  Procedure Laterality Date  . BALLOON DILATION  05/16/2012   Procedure: BALLOON DILATION;  Surgeon: Inda Castle, MD;  Location: [facility];  Service: Endoscopy;  Laterality: N/A;  . BUNIONECTOMY  2011  . COLONOSCOPY    . ENTEROSCOPY  05/16/2012   Procedure: ENTEROSCOPY;  Surgeon: Inda Castle, MD;  Location: [facility];  Service: Endoscopy;  Laterality: N/A;  . JOINT REPLACEMENT    . right shoulder durgery 25 yrs ago  1988  . TOTAL HIP ARTHROPLASTY  2006, 2008   bilateral  . TUBAL LIGATION  1990  . WEDGE RESECTION  2002   lung cancer    Family History  Problem Relation Age of Onset  . Arthritis Mother   . Hearing loss Mother   . Hyperlipidemia Mother   . Hypertension Mother   . Depression Mother   . Anxiety disorder Mother   . Obesity Mother   . Sudden death Mother   . Hypertension Father   . Diabetes Mellitus II Father   . Heart disease Father   . Arthritis Father   . Cancer Father        Brain  . COPD Father   . Diabetes Father   . Hyperlipidemia Father   .  Sleep apnea Father   . Early death Sister        Aneroxia/Bulimic  . Depression Brother   . Early death Investment banker, corporate  . Depression Daughter   . Drug abuse Daughter   . Heart disease Daughter   . Hypertension Daughter   . Stroke Maternal Grandmother   . Hypertension Maternal Grandmother   . Arthritis Maternal Grandfather   . Heart attack Maternal Grandfather   . Hearing loss Maternal Grandfather   . Colon cancer Neg Hx   . Esophageal cancer Neg Hx   . Rectal cancer Neg Hx     Social History   Socioeconomic History  . Marital status: Married    Spouse name: Not on file  . Number of children: 1  . Years of education: Not on file  . Highest education level: Not on file  Occupational History  . Occupation: Admin. assistant  Tobacco Use  . Smoking status: Never Smoker  . Smokeless tobacco: Never Used  Vaping Use  . Vaping Use: Never used   Substance and Sexual Activity  . Alcohol use: Yes    Alcohol/week: 1.0 standard drink    Types: 1 Glasses of wine per week    Comment: Moderate  . Drug use: No  . Sexual activity: Yes  Other Topics Concern  . Not on file  Social History Narrative   Pt lives in [place] with husband Lanny Hurst.  Followed by Dr. Clovis Pu for psychiatry and Rinaldo Cloud for therapy.   Social Determinants of Health   Financial Resource Strain: Not on file  Food Insecurity: Not on file  Transportation Needs: Not on file  Physical Activity: Not on file  Stress: Not on file  Social Connections: Not on file  Intimate Partner Violence: Not on file    Outpatient Medications Prior to Visit  Medication Sig Dispense Refill  . atorvastatin (LIPITOR) 20 MG tablet TAKE (1) TABLET BY MOUTH AT BEDTIME. 90 tablet 0  . carbamazepine (TEGRETOL) 100 MG chewable tablet CHEW 1 TABLET BY MOUTH AT BEDTIME. 90 tablet 0  . celecoxib (CELEBREX) 50 MG capsule Take 1 capsule (50 mg total) by mouth 2 (two) times daily. 60 capsule 0  . cetirizine (ZYRTEC) 10 MG tablet Take 1 tablet (10 mg total) by mouth daily. 90 tablet 3  . chlordiazePOXIDE (LIBRIUM) 25 MG capsule TAKE (1) CAPSULE BY MOUTH AT BEDTIME AS NEEDED FOR ANXIETY 30 capsule 0  . cholecalciferol (VITAMIN D) 25 MCG (1000 UNIT) tablet Take 1,000 Units by mouth daily.    Marland Kitchen dexmethylphenidate (FOCALIN) 10 MG tablet TAKE 1 AND 1/2 TABLETS BY MOUTH TWICE DAILY. 90 tablet 0  . docusate sodium (COLACE) 100 MG capsule Take 100 mg by mouth. 1 tab every other day.    Marland Kitchen doxycycline (VIBRA-TABS) 100 MG tablet Take 1 tablet (100 mg total) by mouth 2 (two) times daily. 14 tablet 7  . EQUETRO 200 MG CP12 12 hr capsule TAKE (1) CAPSULE BY MOUTH AT BEDTIME. 30 capsule 0  . ferrous gluconate (FERGON) 324 MG tablet Take 324 mg by mouth daily with breakfast.    . fluticasone (FLONASE) 50 MCG/ACT nasal spray Use 2 sprays in each nostril BID for a week. After 1 week, decrease to 1 spray in each  nostril BID as needed for congestion/allergies. 16 g 6  . lamoTRIgine (LAMICTAL) 200 MG tablet TAKE (1) TABLET BY MOUTH TWICE DAILY. 180 tablet 0  . lithium carbonate 150 MG  capsule TAKE (1) CAPSULE BY MOUTH ONCE DAILY IN THE AFTERNOON. 90 capsule 0  . Loratadine 10 MG CAPS Take by mouth.    . pantoprazole (PROTONIX) 40 MG tablet Take 1 tablet (40 mg total) by mouth daily. 90 tablet 2  . VRAYLAR 1.5 MG capsule TAKE (1) CAPSULE BY MOUTH ONCE DAILY. 30 capsule 0  . Chlorphen-PE-Acetaminophen (NOREL AD) 4-10-325 MG TABS Take 1 tablet by mouth every 4 (four) hours as needed (nasal congestion, cold symptoms). 20 tablet 1   No facility-administered medications prior to visit.    Allergies  Allergen Reactions  . Azithromycin Anaphylaxis  . Penicillins Anaphylaxis    DID THE REACTION INVOLVE: Swelling of the face/tongue/throat, SOB, or low BP? Yes Sudden or severe rash/hives, skin peeling, or the inside of the mouth or nose? Yes Did it require medical treatment? No When did it last happen? If all above answers are "NO", may proceed with cephalosporin use.  . Adhesive [Tape] Other (See Comments)    On bandaids    ROS Review of Systems  Constitutional: Negative for chills and fever.  HENT: Negative for congestion, sinus pressure, sinus pain and sore throat.   Eyes: Negative for pain and discharge.  Respiratory: Negative for cough and shortness of breath.   Cardiovascular: Negative for chest pain and palpitations.  Gastrointestinal: Negative for abdominal pain, constipation, diarrhea, nausea and vomiting.  Endocrine: Negative for polydipsia and polyuria.  Genitourinary: Positive for frequency and urgency. Negative for dysuria and hematuria.  Musculoskeletal: Negative for neck pain and neck stiffness.  Skin: Negative for rash.  Neurological: Negative for dizziness and weakness.  Psychiatric/Behavioral: Negative for agitation, sleep disturbance and suicidal ideas. The patient is  nervous/anxious.       Objective:    Physical Exam Vitals reviewed.  Constitutional:      General: She is not in acute distress.    Appearance: She is not diaphoretic.  HENT:     Head: Normocephalic and atraumatic.     Nose: Nose normal.     Mouth/Throat:     Mouth: Mucous membranes are moist.  Eyes:     General: No scleral icterus.    Extraocular Movements: Extraocular movements intact.     Pupils: Pupils are equal, round, and reactive to light.  Cardiovascular:     Rate and Rhythm: Normal rate and regular rhythm.     Pulses: Normal pulses.     Heart sounds: Normal heart sounds. No murmur heard.   Pulmonary:     Breath sounds: Normal breath sounds. No wheezing or rales.  Abdominal:     Palpations: Abdomen is soft.     Tenderness: There is no abdominal tenderness.  Musculoskeletal:     Cervical back: Neck supple. No tenderness.     Right lower leg: No edema.     Left lower leg: No edema.  Skin:    General: Skin is warm.     Findings: No rash.  Neurological:     General: No focal deficit present.     Mental Status: She is alert and oriented to person, place, and time.     Sensory: No sensory deficit.     Motor: No weakness.     Gait: Gait abnormal.     Comments: Resting tremor of right hand  Psychiatric:        Behavior: Behavior normal.        Thought Content: Thought content normal.     BP 127/83 (BP Location: Left Arm, Patient Position:  Sitting, Cuff Size: Normal)   Pulse 98   Temp 99.4 F (37.4 C) (Oral)   Resp 18   Ht 5' (1.524 m)   Wt 174 lb 6.4 oz (79.1 kg)   SpO2 95%   BMI 34.06 kg/m  Wt Readings from Last 3 Encounters:  02/04/21 174 lb 6.4 oz (79.1 kg)  12/22/20 175 lb (79.4 kg)  10/07/20 156 lb 1.9 oz (70.8 kg)     There are no preventive care reminders to display for this patient.  There are no preventive care reminders to display for this patient.  Lab Results  Component Value Date   TSH 2.130 01/28/2021   Lab Results  Component  Value Date   WBC 8.4 01/28/2021   HGB 13.2 01/28/2021   HCT 40.4 01/28/2021   MCV 90 01/28/2021   PLT 231 01/28/2021   Lab Results  Component Value Date   NA 139 01/28/2021   K 4.6 01/28/2021   CO2 20 01/28/2021   GLUCOSE 91 01/28/2021   BUN 28 (H) 01/28/2021   CREATININE 0.87 01/28/2021   BILITOT 0.2 01/28/2021   ALKPHOS 64 01/28/2021   AST 16 01/28/2021   ALT 13 01/28/2021   PROT 6.4 01/28/2021   ALBUMIN 4.0 01/28/2021   CALCIUM 9.1 01/28/2021   ANIONGAP 8 01/19/2020   EGFR 74 01/28/2021   GFR 69.31 04/04/2019   Lab Results  Component Value Date   CHOL 211 (H) 01/28/2021   Lab Results  Component Value Date   HDL 86 01/28/2021   Lab Results  Component Value Date   LDLCALC 113 (H) 01/28/2021   Lab Results  Component Value Date   TRIG 65 01/28/2021   Lab Results  Component Value Date   CHOLHDL 2.5 01/28/2021   Lab Results  Component Value Date   HGBA1C 5.5 01/28/2021      Assessment & Plan:   Problem List Items Addressed This Visit      Annual physical exam - Primary   Annual exam as documented. Counseling done  re healthy lifestyle involving commitment to 150 minutes exercise per week, heart healthy diet, and attaining healthy weight.The importance of adequate sleep also discussed. Changes in health habits are decided on by the patient with goals and time frames  set for achieving them. Immunization and cancer screening needs are specifically addressed at this visit.     Relevant Orders  CBC with Differential/Platelet  CMP14+EGFR    Respiratory   Sleep apnea    Had referred to Neurology for sleep study Denies using CPAP, opted not to have sleep study        Nervous and Auditory   Parkinsonism ([place])    F/u with Neurology Not on any medication currently, prefers to avoid taking medications for now      Relevant Orders   Ambulatory referral to Neurology     Other   Bipolar disorder ([place])    On Lithium, Vraylar, Tegretol, Lamictal,  Librium, Equetro and Focalin Follows up with Psychiatry      Relevant Orders   CBC with Differential/Platelet   CMP14+EGFR   Hyperlipidemia    On Atorvastatin Lipid profile reviewed      Relevant Orders   Lipid panel    Other Visit Diagnoses    Acute cystitis without hematuria  UA reviewed Symptoms consistent with UTI Started Macrobid If persistent frequency/urgency, will start Oxybutynin/Myrbetriq      Relevant Medications   nitrofurantoin, macrocrystal-monohydrate, (MACROBID) 100 MG capsule   Other Relevant  Orders   POCT URINALYSIS DIP (CLINITEK) (Completed)      Meds ordered this encounter  Medications  . nitrofurantoin, macrocrystal-monohydrate, (MACROBID) 100 MG capsule    Sig: Take 1 capsule (100 mg total) by mouth 2 (two) times daily.    Dispense:  10 capsule    Refill:  0    Follow-up: Return in about 6 months (around 08/06/2021) for HLD and bipolar disorder.    Lindell Spar, MD

## 2021-02-04 NOTE — Assessment & Plan Note (Signed)
On Atorvastatin Lipid profile reviewed

## 2021-02-04 NOTE — Assessment & Plan Note (Signed)
F/u with Neurology Not on any medication currently, prefers to avoid taking medications for now

## 2021-02-04 NOTE — Patient Instructions (Signed)
Please continue taking medications as prescribed.  Please take at least 64 ounces of fluid in a day.  Please get fasting blood tests done before the next visit.

## 2021-02-05 ENCOUNTER — Ambulatory Visit (INDEPENDENT_AMBULATORY_CARE_PROVIDER_SITE_OTHER): Payer: Medicare Other | Admitting: Psychiatry

## 2021-02-05 DIAGNOSIS — F319 Bipolar disorder, unspecified: Secondary | ICD-10-CM

## 2021-02-05 NOTE — Progress Notes (Signed)
[facility] Counselor/Therapist Progress Note  Patient ID: Megan Whitney, MRN: [id number],    Date: 02/05/2021  Time Spent:  60 minutes   Treatment Type: Individual Therapy  Reported Symptoms: anxious, angry, irritable, depression,   Mental Status Exam:  Appearance:   Casual and Neat     Behavior:  Appropriate, Sharing and Motivated  Motor:  Normal  Speech/Language:   Clear and Coherent  Affect:  anxious, depressed, irritable, angry  Mood:  angry, anxious, depressed and irritable  Thought process:  goal directed  Thought content:    some obsessiveness  Sensory/Perceptual disturbances:    WNL  Orientation:  oriented to person, place, time/date, situation, day of week, month of year and year  Attention:  Fair  Concentration:  Fair  Memory:  [place] of knowledge:   Good  Insight:    Fair  Judgment:   Good and Fair  Impulse Control:  Fair   Risk Assessment: Danger to Self:  No Self-injurious Behavior: No Danger to Others: No Duty to Warn:no Physical Aggression / Violence:No  Access to Firearms a concern: No  Gang Involvement:No   Subjective:  Patient in today reporting anxiety, frustration, anger, irritable, and depression. Some fearfulness, "especially with all theStress and husband's continued drinking are causing me more anxiety and frustration. Occasional arguments with husband are stressful but never get "physical". Had some "prior suicidal thoughts" since last appt but none this past week nor now. Not feeling as motivated.  Also her doctor has asked that she "get a CT scan to see if she has Parkinson's disease" because of her" tremors and some occasional balance issues."  Is heightened patient's anxiety to the point where she needed significant time today to process her thoughts and feelings which she did seem to find helpful and felt calmer and more grounded by the end of session.  Worked hard today on "staying in the present" and not jumping ahead to excessive  worrying and imagining all sorts of outcomes that are all distressing.  Patient did a good job in working on this and eventually at least in session, was able to interrupt and let go of some of her excessive fears.  Encouraged her to hold onto this and she was able to come up with several things that could help her stay less anxious over this including being in touch with friends and doing some things around the house that she enjoys such as knitting, being with her dogs, and journaling.  Is difficult for patient, even before her doctor ever mentioned possible PD, for patient to stay in the present and not giving into her fearful thoughts of the future and feeling that she cannot cope.  She ended on a more positive note at the end of session today but also note is hard for her to maintain this outside of a supportive environment.  Continues to deny any current SI.   Interventions: Cognitive Behavioral Therapy, Solution-Oriented/Positive Psychology and Ego-Supportive  Diagnosis:   ICD-10-CM   1. Bipolar I disorder with rapid cycling ([place])  F31.9     Treatment Goal Plan:  Patient not signing tx plan on computer screen due to [place]. Treatment Goals: Goals remain on plan as patient works on strategies to meet her goals. Progress is noted each visit in "Progress" section of goal plan. Long Term Goal: Reduce overall level, frequency, and intensity of the anxiety so that daily functioning is not impaired. Short Term Goal: 1.Increase understanding of the beliefs  and messages that produce the worry and anxiety. Strategies: 1.Help client develop reality-based positive cognitive messages/self-talk. 2. Develop a "coping card" or other reminder which coping strategies are recorded for patient's later use .    Progress / Plan: Patient initially showing less motivation but it actually seemed to get stronger the more we worked in session on some excessive fears and anxieties related to nonpsychiatric  medical concerns, as noted above.  She was able to recognize that she often responds impulsively to information or issues without thinking things through, and it is often based more on assumptions and fearful thoughts into the future.  We cited some examples in session and patient is to continue working on interruption of disturbing and fearful thoughts and challenging them and then replacing with more realistic and encouraging thoughts.  Plans to be in touch with some specific friends that are supportive of her and maybe make some plans to get together at some point soon.  Definitely more grounded by end of session with her affect reflecting more calmness and working hard to stay in the present.  Encouraged patient to stay in contact with people that are supportive of her, continue interrupting and reframing fearful/anxious thoughts, stop assuming worst case scenarios, stop self negating, intentionally look daily for more positives, continue her involvement in women's programs at her church which she enjoys, focus on the present and what she can control, keep healthy boundaries as needed with others, let her faith be a source of emotional support as well as spiritual, practice more consistent, make healthy choices for herself, decrease overanalyzing and overthinking, and to realize the strength that she shows in the midst of challenging and often changing circumstances as she works with goal-directed behaviors to try and move forward in a more positive direction.    Goal review and progress/challenges noted with patient.  Next appt within 2 weeks.   Shanon Ace, LCSW

## 2021-02-06 ENCOUNTER — Other Ambulatory Visit: Payer: Self-pay | Admitting: Psychiatry

## 2021-02-06 DIAGNOSIS — F319 Bipolar disorder, unspecified: Secondary | ICD-10-CM

## 2021-02-08 ENCOUNTER — Other Ambulatory Visit: Payer: Self-pay | Admitting: Psychiatry

## 2021-02-08 DIAGNOSIS — F319 Bipolar disorder, unspecified: Secondary | ICD-10-CM

## 2021-02-11 ENCOUNTER — Ambulatory Visit (INDEPENDENT_AMBULATORY_CARE_PROVIDER_SITE_OTHER): Payer: Medicare Other | Admitting: Psychiatry

## 2021-02-11 ENCOUNTER — Other Ambulatory Visit: Payer: Self-pay

## 2021-02-11 DIAGNOSIS — F319 Bipolar disorder, unspecified: Secondary | ICD-10-CM

## 2021-02-11 NOTE — Progress Notes (Signed)
[facility] Counselor/Therapist Progress Note  Patient ID: Megan Whitney, MRN: [id number],    Date: 02/11/2021  Time Spent: 50 minutes   Treatment Type: Individual Therapy  Reported Symptoms: anxiety, fearful thoughts, depression, irritability, some suicidal thoughts "not today but within past week"  Mental Status Exam:  Appearance:   Casual and Neat     Behavior:  Appropriate, Sharing, and motivated but decreased some  Motor:  Normal  Speech/Language:   Clear and Coherent  Affect:  Depressed and anxious, irritable  Mood:  anxious and depressed  Thought process:  goal directed  Thought content:    WNL  Sensory/Perceptual disturbances:    WNL  Orientation:  oriented to person, place, time/date, situation, day of week, month of year, and year  Attention:  Fair  Concentration:  Fair  Memory:  Forget often  Fund of knowledge:   Good  Insight:    Good and Fair  Judgment:   Good  Impulse Control:  Good and Fair   Risk Assessment: Danger to Self:  No Self-injurious Behavior: No Danger to Others: No Duty to Warn:no Physical Aggression / Violence:No  Access to Firearms a concern: No  Gang Involvement:No   Subjective:  Patient in today reporting anxiety,fearful thoughts, irritability, depression, some SI since last appt but talks through this in session today stating she get's tired of alcoholic husband's sabotage of his medical treatment but would not harm herself as that would hurt others including her family.  Makes commitment not to harm herself in any way. Admits she just gets so frustrated with husband's behavior in continued drinking, ignoring medical advice, and often be hurtful emotionally to patient. Fearful thoughts re: fear of the future, "fearful of how the world is now and a lack of safety", "fear of getting older and fears re: financial stressors." Patient discussed each of these stressors that she mentioned and did seem to be calmer and more grounded after  venting and sorting through her fearful thoughts.  Does have plans for family to come visit this coming weekend and is involved and looking forward to that time together.  Has postponed the CT scan recommended by her doctor until after family visit.  Patient reflects towards end of session on what she feels like she is finding the most helpful and that is "staying in the present and not jumping ahead to excessive worrying", and trying to believe in herself more that she can handle the stressors in her life and has multiple supports through the help of other people.  At end of session, she again denies current SI.   Interventions: Cognitive Behavioral Therapy, Ego-Supportive, and Insight-Oriented  Diagnosis:   ICD-10-CM   1. Bipolar I disorder with rapid cycling ([place])  F31.9       Plan:  Patient today showing more motivation than last session and is very active today both in processing thoughts and feelings especially fears, and in recognizing some of her strengths and gains, even in the midst of lots of challenges that makes her question her strength.  Encouraged patient to remain in touch with friends especially those that are supportive of her regarding husband's illness and behaviors, continue participating in groups outside the home such as knitting group and some church activities, continue interrupting and reframing fearful/anxious thoughts and replace them with more realistic and empowering thoughts, stop self negating, intentionally look daily for more positives, stop assuming worst case scenarios, continue her involvement in women's programs at her church which  she enjoys, focus on the present and what she can control or change, practice more consistent self talk, make healthy choices for herself, have healthy boundaries, let her faith be a source of emotional support as well as spiritual, decrease her overthinking and overanalyzing, and to feel good about the strength she is showing in the  midst of very challenging and often changing circumstances as she works with goal-directed behaviors to try to find more peace and improved emotional health.  Review and progress/challenges noted with patient.  Next appointment within 2 weeks.   Shanon Ace, LCSW

## 2021-02-18 ENCOUNTER — Telehealth (INDEPENDENT_AMBULATORY_CARE_PROVIDER_SITE_OTHER): Payer: Medicare Other | Admitting: Psychiatry

## 2021-02-18 DIAGNOSIS — F319 Bipolar disorder, unspecified: Secondary | ICD-10-CM | POA: Diagnosis not present

## 2021-02-18 NOTE — Progress Notes (Signed)
[facility] Counselor/Therapist Progress Note  Patient ID: Megan Whitney, MRN: [id number],    Date: 02/18/2021  Time Spent: 50 minutes    Virtual Visit via MyChart Video Note Connected with patient by a video enabled telemedicine/telehealth application or telephone, with their informed consent, and verified patient privacy and that I am speaking with the correct person using two identifiers. I discussed the limitations, risks, security and privacy concerns of performing psychotherapy and management service by telephone and the availability of in person appointments. I also discussed with the patient that there may be a patient responsible charge related to this service. The patient expressed understanding and agreed to proceed. I discussed the treatment planning with the patient. The patient was provided an opportunity to ask questions and all were answered. The patient agreed with the plan and demonstrated an understanding of the instructions. The patient was advised to call  our office if  symptoms worsen or feel they are in a crisis state and need immediate contact.   Therapist Location: [facility] Patient Location: home   Treatment Type: Individual Therapy  Reported Symptoms: anxiety, "husband said I was somewhat manic when they had company recently" but now depressed some but more anxious"  Mental Status Exam:  Appearance:   Casual and Neat     Behavior:  Appropriate, Sharing, and Motivated  Motor:  Normal  Speech/Language:   Clear and Coherent  Affect:  Anxious, depressed  Mood:  anxious and depressed  Thought process:  goal directed  Thought content:    Some obsesssiveness  Sensory/Perceptual disturbances:    WNL  Orientation:  oriented to person, place, time/date, situation, day of week, month of year, and year  Attention:  Good  Concentration:  Fair  Memory:  WNL  Fund of knowledge:   Good  Insight:    Good  Judgment:   Good  Impulse Control:  Good    Risk Assessment: Danger to Self:  No Self-injurious Behavior: No Danger to Others: No Duty to Warn:no Physical Aggression / Violence:No  Access to Firearms a concern: No  Gang Involvement:No   Subjective:  Patient today reporting anxiety and depression. Husband "said I was manic some when they had family visiting" recently but "am more anxious and depressed now, but mostly anxious."  Some tearfulness but not as frequent as I have been on some occasions. States she did have some SI within past week for "part of one day but was able to get out of difficult conversation with husband and got involved with one of my adult coloring books and the SI stopped and has not returned.  Continues to not have any SI at this point.  Makes clear commitment not to harm herself in any way. Looking for a part-time job "without stress". Sleep is "pretty good, sometimes wake up but do go back to sleep". Hard to get in bed at a regular consistent time and she is working on that. Not as irritable this session, states she feels more energy and not as depressed "but I don't feel I'm manic, not taking risks and my talking is not accelerated but do wonder if I might become manic". "Because of my history of being manic, it's easy for me to wonder about it.".Is taking meds as prescribed. Processes more of her frustration and anger with husband who continues to sabotage his medical treatment for cancer by over-drinking.  Mother-in-law is coming today to visit a couple days and that is a positive for  patient so she is looking forward to that.  Reflected on some of the ways that she has made progress over time, and also acknowledging the times where she still struggles.  Has improved on some occasions in the ways that she manages her frustration with husband when he makes self-destructive decisions with alcohol.  Today, seems to be at a better place with "fearful thoughts" as she is not having as many of those more recently.  Stressed  with financial tightness but states she keeps an eye on things better and tries to make better decisions involving finances.  CT scan recommended "by my regular doctor to see if I have Parkinson symptoms" has remained postponed due to having company recently and to have mother-in-law coming this week.  Still works to stay in the present and not jump ahead because that always "creates more worry for me".  Working to increase her self confidence and ability to handle stressful situations more effectively as well as being mindful of the multiple supports she has through her contacts with other people.   Interventions: Solution-Oriented/Positive Psychology and Insight-Oriented  Diagnosis:   ICD-10-CM   1. Bipolar I disorder with rapid cycling ([place])  F31.9       Plan:  Patient today showing good motivation and worked well in session today.  Processing a lot of thoughts and feelings very openly.  Reporting less fears today but also knows that they "come and go sometimes".  Focused some on her strengths.  Encouraged patient in some areas that have proven to be helpful to her including staying in touch with supportive people, continue her involvement with church groups, interrupting and reframing anxious/fearful thoughts and replace them with more realistic and encouraging thoughts, continue meeting with her knitting group, stop self negating, stop assuming worst case scenarios, intentionally look daily for more positives, look specifically for positives within herself, remain focused on the present and what she can control or change, make healthy choices for herself, practice healthy boundaries with others as needed, be more consistent and positive self talk, continue letting her faith be a source of emotional healing as well as spiritual, decrease her overthinking and overanalyzing, and recognize the increased strength that she is showing in the midst of challenging and often changing circumstances as she  works on goal-directed behaviors and strategies to experience more peace and move forward in a more positive direction.  Goal review and progress/challenges noted with patient.  Next appointment within 2 weeks.   Shanon Ace, LCSW

## 2021-02-24 ENCOUNTER — Other Ambulatory Visit: Payer: Self-pay

## 2021-02-24 ENCOUNTER — Ambulatory Visit: Payer: Medicare Other | Admitting: Psychiatry

## 2021-02-24 ENCOUNTER — Encounter: Payer: Self-pay | Admitting: Psychiatry

## 2021-02-24 VITALS — BP 126/64 | HR 97

## 2021-02-24 DIAGNOSIS — F411 Generalized anxiety disorder: Secondary | ICD-10-CM | POA: Diagnosis not present

## 2021-02-24 DIAGNOSIS — F319 Bipolar disorder, unspecified: Secondary | ICD-10-CM

## 2021-02-24 DIAGNOSIS — F900 Attention-deficit hyperactivity disorder, predominantly inattentive type: Secondary | ICD-10-CM | POA: Diagnosis not present

## 2021-02-24 DIAGNOSIS — G3184 Mild cognitive impairment, so stated: Secondary | ICD-10-CM | POA: Diagnosis not present

## 2021-02-24 DIAGNOSIS — R251 Tremor, unspecified: Secondary | ICD-10-CM

## 2021-02-24 DIAGNOSIS — G2401 Drug induced subacute dyskinesia: Secondary | ICD-10-CM

## 2021-02-24 MED ORDER — CARIPRAZINE HCL 3 MG PO CAPS: 3.0000 mg | ORAL_CAPSULE | Freq: Every day | ORAL | 1 refills | Status: DC

## 2021-02-24 NOTE — Progress Notes (Signed)
Megan Whitney [id number] 09/09/1955 65 y.o.     Subjective:   Patient ID:  Megan Whitney is a 65 y.o. (DOB 03/11/1956) female.   Chief Complaint:  Chief Complaint  Patient presents with   Follow-up   Bipolar I disorder with rapid cycling ([place])   Depression   Anxiety    Depression        Associated symptoms include decreased concentration.  Associated symptoms include no suicidal ideas.  Past medical history includes anxiety.   Anxiety Symptoms include confusion, decreased concentration and nervous/anxious behavior. Patient reports no dizziness, nausea, palpitations or suicidal ideas.    Medication Refill Associated symptoms include arthralgias. Pertinent negatives include no nausea or weakness.  lSusan Megan Whitney is  follow-up of r chronic mood swings and anxiety and frequent changes in medications.   At visit December 27, 2018.  Focalin XR was increased from 20 mg to 25 mg daily to help with focus and attention and potentially mood.  When seen February 13, 2019.  In an effort to reduce mood cycling we reduce fluoxetine to 20 mg daily.  At visit August 2020.  No meds were changed.  She continued the following: Focalin XR 25 mg every morning and Focalin 10 mg immediate release daily Equetro 200 mg nightly Fluoxetine 20 mg daily Lamotrigine 200 mg twice daily Lithium 150 mg nightly Vraylar 3 mg daily  She called back November 4 after seeing her therapist stating that she was having some hypomanic symptoms with reduced sleep and increased energy.  This potentiality had been discussed and the decision was made to increase Equetro from 200 mg nightly to 300 mg nightly.  seen August 12, 2019.  Because of balance problems she did not tolerate Equetro 300 mg nightly and it was changed to Equetro 200 mg nightly plus immediate release carbamazepine 100 mg nightly.  Her mood had not been stable enough on Equetro 200 mg nightly alone. Less balance problems with change in CBZ.  seen  September 23, 2019.  The following was changed: For bipolar mixed increase CBZ IR to 200 mg HS.  Disc fall and balance risks.For bipolar mixed increase CBZ IR to 200 mg HS.  Disc fall and balance risks.  She called back October 23, 2019 stating she had had another fall and felt it was due to the medication.  Therefore carbamazepine immediate release was reduced from 200 mg nightly to 100 mg nightly.  The Equetro is unchanged.  Last seen November 04, 2019.  The following was noted:  Better at the moment but balance is still somewhat of a problem.  Started PT to help balance.  Had a fall after tripping on a curb and hit her head on sidewalk.  Got a concussion with nausea and HA and dizziness and light sensitivity.  Not over it.  Concentration problems.  Has gotten back to work after a week.   Mood sx pretty good with some mild depression.  Nothing severe.  Trying to minimize stress and self care as much as possible.  No manic sx lately and sleeping fairly well.  No racing thoughts.   Working another year and plans to retire but H alcoholic and not sure it will be good to be there all the time. Seeing therapist q 2 weeks.  Therapy helping . Recent serum vitamin D level was determined to be low at 33.  The goal and chronically depressed patient's is in the 50s if possible.  So her vitamin D was increased  on August 08, 2018 or thereabouts.  Checked vitamin D level again and this time it was high at 120 and so it was stopped.  She's restarted per PCP at 1000 units daily.  01/06/2020 appointment the following is noted: Still on: Focalin XR 25 mg every morning and Focalin 10 mg immediate release daily Equetro 200 mg nightly Carbamazepine immediate release 100 mg nightly Fluoxetine 20 mg daily Lamotrigine 200 mg twice daily Lithium 150 mg nightly Vraylar 3 mg daily Not good manic.  Angry.  Missed 2 days bc sx.  Last week vacation which didn't go well.  Crying last week and missed a day.  "Pissed off at the  whole world" but also depressed and hard to get OOB today.  Everything makes me angry.   Blows up without control.  Then regrets it.  Sleep irregular lately. Finished PT which might have helped some but still balance problems. Plan: Cannot increase carbamazepine due to balance issues Temporarily Ativan for agitation 0.5 mg tablets  DT mania stop fluoxetine If fails trial loxapine  01/15/2020 patient called after hours with suicidal thoughts and patient was to go to the [facility]. Patient ultimately admitted to [facility] psychiatric unit.  Dr. Clovis Pu spoke with clinical pharmacist they are giving history of medication experience and recommendation for loxapine.  Patient hospital stay for 3 days and discharged on loxapine 10 mg nightly as the new medication.  02/10/2020 phone call patient complaining of insomnia.  Loxapine was increased from 10 to 20 mg nightly due to recent insomnia with mania.  02/14/2020 appointment with the following noted: Lately in tears Monday and Tuesday convinced she couldn't do her job.  Better last couple of days.  Motivation is not real good but not depressed like Monday and Tuesday. This week missing some meds bc couldn't get like Focalin.  Been taking other meds. No sig manic sx.  Sleep is better with more loxapine about 8 hours. Anxiety is chronic.  No SE loxapine so far unless a little dizzy here and there. No med changes.  02/25/2020 appointment urgently made after patient was recently hospitalized.  The following is noted: Unstable.  Today manic driving erratically.  Talking a mile a minute.  Not thinking clearly.  Angry.  Slept OK last night.  Hyperactive with poor productivity for a couple of days.  Weekend OK overall.   No falls lately. More tremor lately.  Retiring July 30.  Plan: For tremor amantadine 100 mg twice a day if needed. Increase loxapine to 3 capsules 1 to 2 hours before bedtime Reduce Vraylar to 1.5 mg  daily or 3 mg every other day.   04/01/2020 appointment with the following noted: Amantadine hs caused NM. Low grade depression for a couple of weeks.  Not severe.   Extended work date 06/04/20 to retire date.  She feels OK about it in some ways but doesn't feel fully up to it.  Doesn't remember when hypomania resolved from last visit.   Sleep is much better now uninterrupted. Hard to remember lithium at lunch. Still has tremor but better with amantadine.  Anxiety still through the roof. Plan: Increase loxapine 40 mg HS.  05/04/20 appt with the following noted:  Increased loxapine to 40.  Anxiety no better.  All kinds of reasons including worry about retirement and paying for things, but worry is probably exaggerated and H say sit is. Sleep good usually.  No SE noted.  Not making her sleep more with  change. Still some manic sx including shortly after last visit and then depressed until the last week.  Irritable and angry. Some panic with SOB and fear of MI. Plan: Continue Vraylar 1.5 mg every day (conisder reduction) Increase loxapine to 50 mg daily for 1 week and if no improvement then increase to 75 mg each night (or 3 of the 25 mg capsules)  Multiple phone calls between appointments with the patient complaining loxapine was causing insomnia.  She has adjusted on timing and dose as she felt it was necessary to make it tolerable because when she takes it in the morning she gets sleepy if she takes very much.  06/09/20 appt Noted: Max tolerated loxapine 25 mg BID.  More than that HS gives strange dreams and difficult to go back to sleep and more in AM too sedated. Not doing well.  Anxiety through the roof.  Did ok with vacation but home worries about everything.   Retired.  Has a lot of time to generally worry.  Started reading again for the first time in awhile.  That's helpful. Takes a while to adjust to retirement.  Anxiety and depression feed each other.  Less interest in some activities.   Later in afternoon is not quite as anxious. Hard to drive with anxiety.   Plan: Reduce to see if it helps reduce anxiety.  Focalin XR 20 mg every morning  and stop Focalin 10 mg immediate release daily Equetro 200 mg nightly Carbamazepine immediate release 100 mg nightly Lamotrigine 200 mg twice daily Lithium 150 mg nightly Continue Vraylar 1.5 mg every day (conisder reduction) continue loxapine to 25 mg BID for longer trial.  07/07/20 appt with the following noted: Tearful and overwhelmed  By [facility] dx of prostate CA with mets bones and nodes with plans for hormone tx and radiation and chemotherapy.  Found out about 3 weeks ago.   He's in sig pain and she's caregiving.  Hard for him to walk even on walker.  Is falling to pieces but realizes it's typical but bc bipolar may be affecting her harder.  Tearful a lot.  Forgetting things, distracted, personal routine disrupted. She still feels the focalin is helpful.  Poor sleep last night bc H but usually 7-8 hours. No effect noticed from Amantadine for tremor. CO more depressed. Plan: Option treat tremor.  change amanatadine 100 mg AM to pramipexole to try to help tremor and mood off label.  Disc risk mania.  She wants to do it..  07/14/2020 phone call:Sue called to report that she will be starting Medicare as of January, 2022.  She will be on regular medicare A&B and prescription plan D.  Her Vraylar and Moss Mc will NOT be covered by medicare.  She needs to know if there are other medications to replace these.  The cost for these medications is over $6000 and she can't afford that price.  She has an appt 12/2, but needs to know asap if there are going to be alternate medications and what they are so she can check on coverage. MD response: There are no reasonable alternatives to these medications that will work in the same way.  She needs to get a better Medicare D plan that will cover the Vraylar and Equetro or her psychiatric symptoms will get worse if  she stops these medications.  There are better Medicare D plans that we will cover these medicines but obviously those plans are more expensive but I can have no control over that.  08/06/2020 appointment with the following noted: Tremor no better and maybe worse with switch from to pramipexole 0.125 mg BID from Amantadine.  No SE. Depressed and anxious and crying a lot.  Hard to tell if related to H.  Anxiety definitely related to H.  H can't do very much bc pain and on pain meds and anemic.  Transfusion yesterday.  H can't drive or shop.  Too weak.  Says she can't find a medicare plan that will cover Equetro and [employer]. Plan: She wants to continue 10 mg immediate release Focalin daily but try skipping to see if anxiety is better. Increase pramipexole to try to help tremor and mood off label.  Disc risk mania.  She wants to do it.. Increase to 0.5 mg BID.  09/07/2020 appointment with the following noted: At last appointment patient was more depressed and anxious and complaining of tremor.  Additional stress with husband's cancer and poor health. Severe anger problems with 0.5 mg BID and mood swings on pramipexole after a week.  Reduced to 0.5 mg AM and still having the problem. Helped tremor tremdously at the higher dose and worse with lower dose.  Tremor same all day except worse with stress.   Stopped Focalin IR without change. Things have been tough and dealing with depression.  H's cancer really affecting me.  Causing depression and anxiety and often in tears.  Able to care for herself and H.  He doesn't require a lot of care but she's not strong emotionally.   Now on [facility] and worry over med coverage. Plan: So wean and stop it loxapine due to NR and intolerance of higher dose.    09/11/2020 phone call that new Medicare plan would not cover Focalin XR and it was switched to Focalin 10 mg twice daily.  Also informed of high cost of Vraylar with new plan. MD response: As I told her at the last visit,  there is nothing similar to Vraylar that is generic.  That is why I suggested she select an insurance plan that would cover it..  Reduce Vraylar that she has remaining to 1 every 3rd day until she runs out.  She may feel OK for awhile without it bc it gets out of the body slowly.  We'll see how she's doing at her visit next month   09/25/2020 phone call from patient saying she was more depressed since tapering off the Vraylar including disorganized thinking and lack of motivation. MD response: Pt got some samples.  However she was warned before switch to Medicare to make sure plan adequately covered Vraylar.   She didn't do this.   We tried all reasonable alternatives to Vraylar which either failed or caused intolerable SE.  I  cannot fix this problem for her.  She will inevitably worsen when she stops an effective tolerated med.  10/07/2020 patient called back stating she wanted to restart loxapine.  10/19/2020 appointment with the following noted: Says none of Medicare D plans cover Vraylar except with high copay of $400/month. Won't be able to stay on it but is taking some of the Vraylar now.   Currently on Vraylar 1.5 mg daily but that won't last and she'll have to stop it.  Has cut back and feels more depressed markedly. She decided the loxapine was helping some and wanted to restart loxapine 25 mg in AM.  Makes her sleepy.    Paying $90 monthly for Equetro. But had balance probles with CBZ ER. Wasn't taking lithium  for a long while and restarted 150 mg HS. Wants to stay on librium 25 mg HS bc it helps sleep but insurance won't pay for it either. Plan: Switch   Focalin XR 20 mg  To IR 15 mg BID DT Cost and off label for depression   Continue the Vraylar as long as she can until she runs out. Pending neurology evaluation  11/16/2020 Telephone call with [facility] neurology PA that saw the patient today.  Reviewed the long unstable history of bipolar disorder and multiple previous med trials.    Neurology see some EPS likely related to [place].  However they also would like to consider either Ingrezza or Austedo given her multiple failures of meds for tremor and EPS.  They suspect some TD type symptoms.  They will discuss this with the patient. Discussed the neurology evaluation at length.  The note is not accessible at this time in epic. Kofi A. Doonquah, MD noted at time tremor was minimal but suspected EPS and TD DT toes wiggling and teeth grinding. We will defer any changes such as Austedo or Ingrezza because of the risk of worsening parkinsonism until the patient is stable on Vraylar dosing.  11/17/2020 appointment with the following noted: Frustrated tremor got better in the last week for no apparent reason. [employer] gave them money so taking the [employer] daily for 3 weeks and it's a "huge difference" with depression much better but not gone. So stopped loxapine.   12/21/2020 appointment with the following noted:  Able to stay on Vraylar 1.5 mg daily but still having depression and hard to function.  Not sure why that is unless dealing with H's cancer.  H had some good news with pending bone scan and Cat scan.  Now he's having a lot of pain even on pain meds.  He's also started drinking again and that worries her.  Therefore worried.   Retired.  So mind is freer to worry but trying to stay active.   Tolerating the meds well.  Tremor is better than it was, but worse with stress.   Hygiene is not as good as usual for showering. Able to stay on Focalin 15 mg BID usually.  No SE other than tremor. Sleep is pretty good usually. Plan: No med changes.  She is having to use Vraylar samples because of the cost of the medicine.  01/21/2021 appointment with the following noted: Able to purchase Vraylar and samples to spread it out.  $327/30 caps. Taking 1.5 mg daily.  Suffering depression still.  SI last week and so depressed.   2 nights ago ? Manic yelling, cursing and screaming for several  hours and evened out the next day seeing therapist. SE seem pretty well with minimal tremors.  Still mouth movements about the same.  Grimaces a good amount.   Thinks she is rapid cycling. Assessment plan: More depressed with less Vraylar. Continue   Focalin XR 20 mg  To IR 15 mg BID DT Cost and off label for depression   Equetro 200 mg nightly Carbamazepine immediate release 100 mg nightly Lamotrigine 200 mg twice daily Lithium 150 mg nightly Increase Vraylar to 1.5mg  alternating with 3 mg every other day to improve recent depressive and manic sx.  02/24/2021 appointment with the following noted: Increase Vraylar but not much difference. Still cycles from even to irritable to depressed.  Sometimes in the same day but typically a few days in a row.  Irritable depressed days are the most frequent.  Would like to get rid of this.  Still intermittent SI without plan or intent.  Still cry often usually over fear of future bc of H's cancer. H says sometimes is confused and other days is very clear.  No reason known. Consistent with meds. Sleep variable with recent bad dreams and restless sleep.   No SE with Vraylar. H thought she was manic a couple of weekends ago with family visiting.  But when I'm in those stages I don't see it. Still getting together with friends.  Past Psychiatric Medication Trials: Vraylar 4.5 SE mouth movements reduced to 3 mg 3/20. It was effective at lower doses for depression.  Worse off it.  Vraylar 1.5 mg every third day led to relapse of significant depression. lithium 150,   Trileptal 450, Depakote, Equetro 300 balance issues, CBZ ER falling,   Lamictal 200 twice daily, Latuda 80, , olanzapine, Seroquel, risperidone, Abilify, loxapine 25 mg BID (max tolerated) Focalin,  Ritalin,  fluoxetine 60,  buspirone, sertraline 100, Wellbutrin history of facial tics, paroxetine cognitive side effects ropinirole, amantadine, Sinemet, Artane, Cogentin,  pramipexole 0.5 mg  BID helped tremor but caused anger trazodone hangover, Ambien hangover,  Review of Systems:  Review of Systems  HENT:  Positive for dental problem and tinnitus.        Chirping cricket sounds in hears since January  Cardiovascular:  Negative for palpitations.  Gastrointestinal:  Negative for nausea.  Musculoskeletal:  Positive for arthralgias. Negative for gait problem.  Neurological:  Positive for tremors. Negative for dizziness, seizures, syncope, weakness and light-headedness.       Still balance problems. No falls lately. Tremor is worse in hands  Psychiatric/Behavioral:  Positive for confusion, decreased concentration and dysphoric mood. Negative for agitation, behavioral problems, hallucinations, self-injury, sleep disturbance and suicidal ideas. The patient is nervous/anxious. The patient is not hyperactive.  No falls since here. Not currently depressed but unable to remove this from the list.  Medications: I have reviewed the patient's current medications.  Current Outpatient Medications  Medication Sig Dispense Refill   atorvastatin (LIPITOR) 20 MG tablet TAKE (1) TABLET BY MOUTH AT BEDTIME. 90 tablet 0   carbamazepine (TEGRETOL) 100 MG chewable tablet CHEW 1 TABLET BY MOUTH AT BEDTIME. 90 tablet 0   celecoxib (CELEBREX) 50 MG capsule Take 1 capsule (50 mg total) by mouth 2 (two) times daily. 60 capsule 0   chlordiazePOXIDE (LIBRIUM) 25 MG capsule TAKE (1) CAPSULE BY MOUTH AT BEDTIME AS NEEDED FOR ANXIETY 30 capsule 0   cholecalciferol (VITAMIN D) 25 MCG (1000 UNIT) tablet Take 1,000 Units by mouth daily.     dexmethylphenidate (FOCALIN) 10 MG tablet TAKE 1 AND 1/2 TABLETS BY MOUTH TWICE DAILY. 90 tablet 0   EQUETRO 200 MG CP12 12 hr capsule TAKE (1) CAPSULE BY MOUTH AT BEDTIME. 30 capsule 0   ferrous gluconate (FERGON) 324 MG tablet Take 324 mg by mouth daily with breakfast.     lamoTRIgine (LAMICTAL) 200 MG tablet TAKE (1) TABLET BY MOUTH TWICE DAILY. 60 tablet 0   lithium  carbonate 150 MG capsule TAKE (1) CAPSULE BY MOUTH ONCE DAILY IN THE AFTERNOON. 90 capsule 0   Loratadine 10 MG CAPS Take by mouth.     pantoprazole (PROTONIX) 40 MG tablet Take 1 tablet (40 mg total) by mouth daily. 90 tablet 2   cariprazine (VRAYLAR) 3 MG capsule Take 1 capsule (3 mg total) by mouth daily. 30 capsule 1   cetirizine (ZYRTEC) 10 MG tablet Take 1  tablet (10 mg total) by mouth daily. (Patient not taking: Reported on 02/24/2021) 90 tablet 3   docusate sodium (COLACE) 100 MG capsule Take 100 mg by mouth. 1 tab every other day. (Patient not taking: Reported on 02/24/2021)     doxycycline (VIBRA-TABS) 100 MG tablet Take 1 tablet (100 mg total) by mouth 2 (two) times daily. (Patient not taking: Reported on 02/24/2021) 14 tablet 7   fluticasone (FLONASE) 50 MCG/ACT nasal spray Use 2 sprays in each nostril BID for a week. After 1 week, decrease to 1 spray in each nostril BID as needed for congestion/allergies. (Patient not taking: Reported on 02/24/2021) 16 g 6   nitrofurantoin, macrocrystal-monohydrate, (MACROBID) 100 MG capsule Take 1 capsule (100 mg total) by mouth 2 (two) times daily. (Patient not taking: Reported on 02/24/2021) 10 capsule 0   No current facility-administered medications for this visit.    Medication Side Effects: Other: tremor and weight gain.   Dyskinesia appears better  SE bettter than they were.  Balance problems intermittently  Allergies:  Allergies  Allergen Reactions   Azithromycin Anaphylaxis   Penicillins Anaphylaxis    DID THE REACTION INVOLVE: Swelling of the face/tongue/throat, SOB, or low BP? Yes Sudden or severe rash/hives, skin peeling, or the inside of the mouth or nose? Yes Did it require medical treatment? No When did it last happen?       If all above answers are "NO", may proceed with cephalosporin use.   Adhesive [Tape] Other (See Comments)    On bandaids    Past Medical History:  Diagnosis Date   ADD (attention deficit disorder)    Allergy     Seasonal   Anemia    History of GI blood loss   Anxiety    Arthritis    Atrophy of vagina 10/07/2020   Bipolar 1 disorder (HCC)    Cancer ([place])    Colon polyps    Depression    Diabetes mellitus ([place])    Edema, lower extremity    Epistaxis    Around 2011 or 2012, required cauterization.    Esophageal stricture    Fracture of superior pubic ramus (HCC) 11/28/2018   GERD (gastroesophageal reflux disease)    Headache(784.0)    Hyperlipidemia    Interstitial cystitis    Joint pain    Lactose intolerance    Lung cancer ([place]) 2002   Obesity    Osteoarthritis    Palpitations    Sleep apnea    Doesn't use a CPAP   Suicidal ideation 01/20/2020   Swallowing difficulty     Family History  Problem Relation Age of Onset   Arthritis Mother    Hearing loss Mother    Hyperlipidemia Mother    Hypertension Mother    Depression Mother    Anxiety disorder Mother    Obesity Mother    Sudden death Mother    Hypertension Father    Diabetes Mellitus II Father    Heart disease Father    Arthritis Father    Cancer Father        Brain   COPD Father    Diabetes Father    Hyperlipidemia Father    Sleep apnea Father    Early death Sister        Aneroxia/Bulimic   Depression Brother    Early death Proofreader Accident   Depression Daughter    Drug abuse Daughter    Heart disease Daughter  Hypertension Daughter    Stroke Maternal Grandmother    Hypertension Maternal Grandmother    Arthritis Maternal Grandfather    Heart attack Maternal Grandfather    Hearing loss Maternal Grandfather    Colon cancer Neg Hx    Esophageal cancer Neg Hx    Rectal cancer Neg Hx     Social History   Socioeconomic History   Marital status: Married    Spouse name: Not on file   Number of children: 1   Years of education: Not on file   Highest education level: Not on file  Occupational History   Occupation: Admin. assistant  Tobacco Use   Smoking status: Never   Smokeless tobacco:  Never  Vaping Use   Vaping Use: Never used  Substance and Sexual Activity   Alcohol use: Yes    Alcohol/week: 1.0 standard drink    Types: 1 Glasses of wine per week    Comment: Moderate   Drug use: No   Sexual activity: Yes  Other Topics Concern   Not on file  Social History Narrative   Pt lives in [place] with husband Lanny Hurst.  Followed by Dr. Clovis Pu for psychiatry and Rinaldo Cloud for therapy.   Social Determinants of Health   Financial Resource Strain: Not on file  Food Insecurity: Not on file  Transportation Needs: Not on file  Physical Activity: Not on file  Stress: Not on file  Social Connections: Not on file  Intimate Partner Violence: Not on file    Past Medical History, Surgical history, Social history, and Family history were reviewed and updated as appropriate.   Please see review of systems for further details on the patient's review from today.   Objective:   Physical Exam:  BP 126/64   Pulse 97   Physical Exam Constitutional:      General: She is not in acute distress. Musculoskeletal:        General: No deformity.  Neurological:     Mental Status: She is alert and oriented to person, place, and time.     Cranial Nerves: No dysarthria.     Motor: Tremor present.     Coordination: Coordination normal.     Comments: Mild resting R hand rotational tremor Fidgety mildy with feet Very mild lip pursing.  Psychiatric:        Attention and Perception: Attention and perception normal. She does not perceive auditory or visual hallucinations.        Mood and Affect: Mood is anxious and depressed. Affect is not labile, blunt, angry or inappropriate.        Speech: Speech normal.        Behavior: Behavior normal. Behavior is cooperative.        Thought Content: Thought content normal. Thought content is not paranoid or delusional. Thought content does not include homicidal or suicidal ideation. Thought content does not include homicidal or suicidal plan.         Cognition and Memory: Cognition and memory normal.        Judgment: Judgment normal.     Comments: Insight intact More depressed but also cycling.    Lab Review:     Component Value Date/Time   NA 139 01/28/2021 0926   K 4.6 01/28/2021 0926   CL 104 01/28/2021 0926   CO2 20 01/28/2021 0926   GLUCOSE 91 01/28/2021 0926   GLUCOSE 102 (H) 01/19/2020 1158   BUN 28 (H) 01/28/2021 0926   CREATININE 0.87 01/28/2021 0926  CALCIUM 9.1 01/28/2021 0926   PROT 6.4 01/28/2021 0926   ALBUMIN 4.0 01/28/2021 0926   AST 16 01/28/2021 0926   ALT 13 01/28/2021 0926   ALKPHOS 64 01/28/2021 0926   BILITOT 0.2 01/28/2021 0926   GFRNONAA 83 03/02/2020 1433   GFRAA 96 03/02/2020 1433       Component Value Date/Time   WBC 8.4 01/28/2021 0926   WBC 7.4 01/19/2020 1158   RBC 4.47 01/28/2021 0926   RBC 4.19 01/19/2020 1158   HGB 13.2 01/28/2021 0926   HCT 40.4 01/28/2021 0926   PLT 231 01/28/2021 0926   MCV 90 01/28/2021 0926   MCH 29.5 01/28/2021 0926   MCH 30.3 01/19/2020 1158   MCHC 32.7 01/28/2021 0926   MCHC 32.1 01/19/2020 1158   RDW 13.0 01/28/2021 0926   LYMPHSABS 3.5 (H) 01/28/2021 0926   MONOABS 0.7 04/04/2019 1004   EOSABS 0.3 01/28/2021 0926   BASOSABS 0.1 01/28/2021 0926  Vitamin D level 33 on 10K units daily on 12/4/`9 Increased to prescription vitamin d 50K units Monday, Wed, Friday.  Rx sent in.   Lithium Lvl  Date Value Ref Range Status  10/21/2018 0.18 (L) 0.60 - 1.20 mmol/L Final    Comment:    Performed at [facility], [address], [place], [place] [address]     No results found for: PHENYTOIN, PHENOBARB, VALPROATE, CBMZ   .res Assessment: Plan:    Bipolar I disorder with rapid cycling ([place]) - Plan: cariprazine (VRAYLAR) 3 MG capsule  Generalized anxiety disorder  Attention deficit hyperactivity disorder (ADHD), predominantly inattentive type  Mild cognitive impairment  Tremor of both hands  Tardive dyskinesia  Edithe has chronic rapid cycling  bipolar disorder which is chronically unstable and has been difficult to control.  The rapid cycling is making it difficult to control frequency of depressive episodes and the anxiety as well.  We have typically had to make frequent med changes.  We have had to reduce mood stabilizer dosages including Vraylar and Equetro she is very sensitive to carbamazepine because higher doses cause dizziness.     More depressed and cycling with less Vraylar.  No easy solution to this.  She's tried all FDA approved meds for bipolar depression except Caplyta which she can't afford either.  Failed many other meds too. Disc ECT.  She was denie patient assistance with Vraylar. Currently on 1.5 mg daily. Mouth movements tolerable.  Some grinding of teeth during the day.  She is more depressed at the moment but it is likely that the added stress with her husband is a contributing factor and unlikely that any immediate med change is going to help.  She is failed multiple prior meds for depression.  Caplyta is the only reasonable alternative and as mentioned is going to be expensive and probably unobtainable.  Wear a mouthguard bc of teeth grinding.    Prone to UTI and may be causing confusion discussed.  Had confirmed UTI recently.  Working on neuro eval to [place] PD  Since last year when she was stable she started having hypomanic symptoms again.  We increased Equetro back to 300 mg nightly but she she has had balance problems. It had stopped the manic symptoms but then she started having balance problems again and we had to switch it back to Equetro 300 and add carbamazepine immediate release 100 nightly..  Out of work ADD less of a problem.  Discussed the risk of stimulants including that that could contribute to mood  instability and mood swings.    She has a high residual anxiety.  It has been impossible to control all of her symptoms simultaneously without causing side effects.  She agrees.  Continue the  following Discussed side effects of each medicine. Continue   Focalin XR 20 mg  To IR 15 mg BID DT Cost and off label for depression   Equetro 200 mg nightly Carbamazepine immediate release 100 mg nightly Lamotrigine 200 mg twice daily Lithium 150 mg nightly Increase Vraylar to 3 mg every day to improve recent depressive and manic sx. Disc risk worsening TD and tremor.    If cannot get some meds covered will have marked relapse risks DT multiple other med failures and intolerances.  Discussed potential metabolic side effects associated with atypical antipsychotics, as well as potential risk for movement side effects. Advised pt to contact office if movement side effects occur.  She may be having some mild TD with toe movement and grimacing.  Disc this in detail.  At this point it's tolerable.  . Suspect it.  "I don't realize it". But sometimes others notice  Disc SE meds and this is heightened by the complication of necessary polypharmacy.  Supportive therapy in terms of dealing with husband's addiction and now new dx metastatic prostate CA.Marland Kitchen  She's interested in Bipolar Support Group and info given.  Requires frequent FU DT chronic instability.  Wants to schedule monthly.  FU 8 weeks  Lynder Parents MD, DFAPA  Please see After Visit Summary for patient specific instructions.    Future Appointments  Date Time Provider [place]  02/25/2021  8:00 AM Shanon Ace, LCSW [facility] None  03/04/2021  3:00 PM Shanon Ace, LCSW [facility] None  03/09/2021  4:00 PM Shanon Ace, LCSW [facility] None  03/18/2021 11:00 AM Shanon Ace, LCSW [facility] None  03/25/2021  9:00 AM Shanon Ace, LCSW [facility] None  04/19/2021  2:00 PM Cottle, Billey Co., MD [facility] None  06/11/2021 10:15 AM Felipa Furnace, DPM [facility]  08/06/2021 10:40 AM Lindell Spar, MD [facility]    No orders of the defined types were placed in this encounter.     -------------------------------

## 2021-02-24 NOTE — Patient Instructions (Signed)
Increase Vraylar to 3 mg daily.

## 2021-02-25 ENCOUNTER — Ambulatory Visit: Payer: Medicare Other | Admitting: Psychiatry

## 2021-02-25 DIAGNOSIS — F319 Bipolar disorder, unspecified: Secondary | ICD-10-CM

## 2021-02-25 NOTE — Progress Notes (Signed)
[facility] Counselor/Therapist Progress Note  Patient ID: Megan Whitney, MRN: [id number],    Date: 02/25/2021  Time Spent: 60 minutes   Treatment Type: Individual Therapy  Reported Symptoms: anxiety, depression, irritability, anger  Mental Status Exam:  Appearance:   Casual     Behavior:  Appropriate, Sharing, and Motivated  Motor:  Normal  Speech/Language:   Clear and Coherent  Affect:  Anxious, depressed  Mood:  anxious and depressed  Thought process:  goal directed  Thought content:    WNL  Sensory/Perceptual disturbances:    WNL  Orientation:  oriented to person, place, time/date, situation, day of week, month of year, and year  Attention:  Fair  Concentration:  Fair  Memory:  [place] of knowledge:   Good  Insight:    Good and Fair  Judgment:   Fair  Impulse Control:  Good and Fair   Risk Assessment: Danger to Self:  No Self-injurious Behavior: No Danger to Others: No Duty to Warn:no Physical Aggression / Violence:No  Access to Firearms a concern: No  Gang Involvement:No   Subjective:  Patient in today reporting anxiety, depression, anger, irritable, and states tearfulness has gotten better. Today presents less symptomatic but can be volatile. Feels she is getting better at "warding off symptoms" at times and states last night she was feeling some increased anxiety and she picked up her knitting and that helped calm her some to the point "I ended up singing which was good." Mood still can be volatile, states she had some prior SI earlier in the week but none currently. States if she ever became suicidal and felt she was going to harm herself, she would have husband take her to hospital to be evaluated. Makes commitment today not to harm self. Sleep has been "not good" and she admits to getting out of her regular sleep routine that allows for better sleep. Looking for a part-time job and is applying at a vet's office.Worked today with her frustration and anger  with husband due to his drinking and "sabotaging his medical treatment for his cancer. Was able to verbalize a lot of her thoughts and feelings including the realization that she  cannot "fix others" and can only manage herself and work on "things to make me stronger".       Interventions: Cognitive Behavioral Therapy, Solution-Oriented/Positive Psychology, and Ego-Supportive  Diagnosis:   ICD-10-CM   1. Bipolar I disorder with rapid cycling ([place])  F31.9       Plan:  Patient today showing improved motivation and some follow through on prior recommendations re: her tx goal plan, being with other people more, and intentionally more involved more with family and friends. Less anxious/fearful thoughts. Focus on some of her strengths which she identifies as: determination, can get along well with people, persevering, I push to get what I need, I'm learning to be more positive and more patient and less impulsive, and acknowledges these are a work in progress. Encouraged patient to continue some behaviors that she has found helpful in the past including looking for her positives, challenge her anxious/fearful thoughts when they happen and replace them with more realistic/empowering thoughts, staying in touch with supportive people, remain involved with church groups, intentionally look for more positives daily, continue meeting with her knitting group, stop self negating, stop assuming worst case scenarios, focus on the present and what she can control, practice healthy boundaries with others, make healthy choices for herself, practice consistent positive self  talk, let her faith be a source of emotional health as well as spiritual, decrease overanalyzing and overthinking, and feel good about the strength encouraged she is showing in the midst of challenging circumstances as she works with her goal-directed behaviors to move forward in a more positive direction towards improved emotional health.  Goal review  and progress/challenges noted with patient.  Appointment within 2 weeks.   Shanon Ace, LCSW

## 2021-03-04 ENCOUNTER — Other Ambulatory Visit: Payer: Self-pay

## 2021-03-04 ENCOUNTER — Ambulatory Visit: Payer: Medicare Other | Admitting: Psychiatry

## 2021-03-04 DIAGNOSIS — F319 Bipolar disorder, unspecified: Secondary | ICD-10-CM | POA: Diagnosis not present

## 2021-03-04 NOTE — Progress Notes (Signed)
[facility] Counselor/Therapist Progress Note  Patient ID: Megan Whitney, MRN: [id number],    Date: 03/04/2021  Time Spent: 58 minutes  Treatment Type: Individual Therapy  Reported Symptoms: anxiety, depression "some improvement", some tearfulness  Mental Status Exam:  Appearance:   Casual and Neat     Behavior:  Appropriate and Sharing  Motor:  Normal  Speech/Language:   Clear and Coherent  Affect:  anxious  Mood:  anxious and depressed  Thought process:  goal directed  Thought content:    Obsessions  Sensory/Perceptual disturbances:    WNL  Orientation:  oriented to person, place, time/date, situation, day of week, month of year, year, and stated date of March 04, 2021  Attention:  Good  Concentration:  Good and Fair  Memory:  WNL  Fund of knowledge:   Good  Insight:    Fair  Judgment:   Good  Impulse Control:  Good   Risk Assessment: Danger to Self:  No Self-injurious Behavior: No Danger to Others: No Duty to Warn:no Physical Aggression / Violence:No  Access to Firearms a concern: No  Gang Involvement:No   Subjective:  Patient in today reporting anxiety, depression, and some tearfulness.  Depression and tearfulness has decreased however did have some SI a few nights ago, but did not harm self in any way, went to sleep and woke up feeling better and no further SI. Not feeling as motivated at the moment. Did seem more motivated as the session continued. Finds coloring in adult color book calming. Also finds singing helpful.  Mood volatile at times. Talked through several situations involving friends, family, and husband's health "but he has not consumed alcohol over past week" and seemed to help patient feel more hopeful and grounded.  Has decided not to seek a part-time job right now and wait until things feel a little more leveled out.  Gets frustrated with husband when he makes poor decisions and then very sad if she thinks he is having a bad day physically.  Is  catching herself more and automatically assuming "worst case scenarios and is trying to pull back and not jump so quickly to those conclusions, understanding that the excessive worrying without really knowing certain information or details only serves to take away her peace in the moment.  Does admit that a lot of what she worries about never happens.  Concerned about a friend who was recently diagnosed with breast cancer, although friends Dr. Jaquelyn Bitter very optimistic for her.  Patient states she is working on being optimistic as well.  Interventions: Cognitive Behavioral Therapy and Solution-Oriented/Positive Psychology  Diagnosis:   ICD-10-CM   1. Bipolar I disorder with rapid cycling ([place])  F31.9       Treatment Goal Plan of Care: Patient not signing tx plan on computer screen due to [place]. Treatment Goals: Goals remain on plan as patient works on strategies to meet her goals.  Progress is noted each visit in "Progress" section of goal plan. Long Term Goal: Reduce overall level, frequency, and intensity of the anxiety so that daily functioning is not impaired. Short Term Goal: 1.Increase understanding of the beliefs and messages that produce the worry and anxiety. Strategies: 1.Help client develop reality-based positive cognitive messages/self-talk. 2. Develop a "coping card" or other reminder which coping strategies are recorded for patient's later use .    Plan:  Patient today showing lower motivation initially but her level of motivation seemed to improve over the time of the session.  Does show some progress and follow through on treatment goal recommendations and and being with other people more, intentionally more involved with family and friends.  At onset of session, it was tough for her to recognize any strengths but by the end of session she was able to focus more on them including having some determination, some perseverance, is learning to try to be more positive and not jump to  the negative or fearful, and tends to get along well with people.  Encouraged her to continue these as well as other behaviors have been helpful to her in the past including: Challenging her anxious/fearful thoughts and replace them with more realistic/empowering thoughts, look for the positives within herself daily, intentionally look for positives beyond herself daily in the world around her, stay in touch with supportive people, remain involved with her church friends, stop self negating, stop assuming worst case scenarios, focus on the present and what she can control, stay involved with her knitting group, practice healthy boundaries with others as needed, get outside daily and walk some, practice consistent positive self talk, decrease overthinking and overanalyzing, allow her faith to be a source of emotional support as well as spiritual, and to recognize the strength and courage she is showing in the midst of challenging circumstances as she works with her goal-directed behaviors to move forward in a more positive direction.   Goal review and progress/challenges noted with patient.  Next appointment within 2 weeks.   Shanon Ace, LCSW

## 2021-03-09 ENCOUNTER — Ambulatory Visit (INDEPENDENT_AMBULATORY_CARE_PROVIDER_SITE_OTHER): Payer: Medicare Other | Admitting: Psychiatry

## 2021-03-09 DIAGNOSIS — F319 Bipolar disorder, unspecified: Secondary | ICD-10-CM | POA: Diagnosis not present

## 2021-03-09 NOTE — Progress Notes (Signed)
[facility] Counselor/Therapist Progress Note  Patient ID: Megan Whitney, MRN: [id number],    Date: 03/09/2021  Time Spent: 50 minutes   Treatment Type: Individual Therapy  Reported Symptoms: anxiety, depression, fear, stress, some panic, anger at others, thinking worst case scenarios, some tearfulness  Mental Status Exam:  Appearance:   Casual     Behavior:  Appropriate, Sharing, and Motivated  Motor:  Normal  Speech/Language:   Clear and Coherent  Affect:  Depressed, Tearful, and anxious  Mood:  anxious and depressed  Thought process:  normal  Thought content:    Some obsessiveness  Sensory/Perceptual disturbances:    WNL  Orientation:  oriented to person, place, time/date, situation, day of week, month of year, year, and stated date of March 09, 2021  Attention:  Fair  Concentration:  Fair  Memory:  Some forgetting and wonders "if it might be med-related"  Fund of knowledge:   Good  Insight:    Fair  Judgment:   Fair  Impulse Control:  Fair   Risk Assessment: Danger to Self:  No Self-injurious Behavior: No Danger to Others: No Duty to Warn:no Physical Aggression / Violence:No  Access to Firearms a concern: No  Gang Involvement:No   Subjective:  Patient today reporting feeling worse "for no reason", reports panic at times and too afraid to drive in for her appt today, Denies any SI, anxious, angry, depressed, fears, tearful off and on, and imagining worst case scenarios. Symptoms worsened within past few days and says "I don't know what has triggered me."  Interrupted sleep. States she's not sleeping well and  is still waking up a couple times during the night and then goes  back to sleep in a few minutes.  Anxiety heightened recently and has tried breathing exercises, positive self-talk, knitting, coloring , all "help me sometimes when I'm anxious." Had family company recently and they left today to go home. States several stressors including personal and family may  be contributing to her current anxiety and depression. Is going to attend her knitting group tonight and "that will be good for me." Talked through some family issues and also issues related to husband with CA, and how her anxiety escalated past few days. Talked well through session today and was calmer and more grounded by end of session.  She is thinking that her most recent distress could have been more related to family coming and leaving, some issues with husband, and maybe her sleep that has been off the past few nights as far as interrupted sleep.  She is hoping she will sleep better tonight, "and some of my meds do help me sleep usually".  States that she will call our office if things do not get better for her.    Interventions: Cognitive Behavioral Therapy and Solution-Oriented/Positive Psychology  Diagnosis:   ICD-10-CM   1. Bipolar I disorder with rapid cycling ([name])  F31.9       Treatment Goal Plan of Care: Patient not signing tx plan on computer screen due to [place]. Treatment Goals: Goals remain on plan as patient works on strategies to meet her goals.  Progress is noted each visit in "Progress" section of goal plan. Long Term Goal: Reduce overall level, frequency, and intensity of the anxiety so that daily functioning is not impaired. Short Term Goal: 1.Increase understanding of the beliefs and messages that produce the worry and anxiety. Strategies: 1.Help client develop reality-based positive cognitive messages/self-talk. 2. Develop a "coping card" or  other reminder which coping strategies are recorded for patient's later use .    Plan:  Patient today showing good motivation even though she felt her motivation was not good at the beginning of session.  It seemed to pick up as she talked more in detail throughout the session.  Was somewhat tearful at the beginning of session.  Enjoyed family being here recently but also felt more stressed with them here during part of the  time.  Reports she follow through on some recommendations from prior sessions including trying not to jump to negative conclusions, trying to be less fearful when there is not explainable reason to be so, having more determination and follow through, trying to be more positive, and believing in herself more and some of the strength that she has shown at times.  Encouraged patient to continue with some other behaviors that have proven to be helpful to her in the past between sessions including: Looking for more positives than negatives, finding the positives within herself, challenging her anxious/fearful thoughts and replacing them with more realistic/empowering thoughts, stop assuming worst case scenarios, focus on the present and what she can control, remain in touch with supportive people, stay in contact with her church friends and involved in church groups, stop self negating, practice consistent positive self talk, get outside and walk daily, stay involved with her knitting group, decrease overthinking and overanalyzing, practice healthy boundaries, allow her faith to be a source of emotional support as well as spiritual, and to feel good about the strength encouraged she is showing in the midst of challenging circumstances as she works with goal-directed behaviors to move forward in a more positive direction towards improved emotional health.   Goal review and progress/challenges noted with patient.   Next appt within 2 weeks.   Shanon Ace, LCSW

## 2021-03-12 ENCOUNTER — Other Ambulatory Visit: Payer: Self-pay | Admitting: Psychiatry

## 2021-03-12 DIAGNOSIS — F314 Bipolar disorder, current episode depressed, severe, without psychotic features: Secondary | ICD-10-CM

## 2021-03-12 DIAGNOSIS — F319 Bipolar disorder, unspecified: Secondary | ICD-10-CM

## 2021-03-12 NOTE — Telephone Encounter (Signed)
Seen 6/22 filled 5/24

## 2021-03-15 ENCOUNTER — Other Ambulatory Visit: Payer: Self-pay | Admitting: Psychiatry

## 2021-03-15 DIAGNOSIS — F319 Bipolar disorder, unspecified: Secondary | ICD-10-CM

## 2021-03-18 ENCOUNTER — Ambulatory Visit: Payer: Medicare Other | Admitting: Psychiatry

## 2021-03-18 ENCOUNTER — Other Ambulatory Visit: Payer: Self-pay

## 2021-03-18 ENCOUNTER — Telehealth: Payer: Self-pay

## 2021-03-18 DIAGNOSIS — F319 Bipolar disorder, unspecified: Secondary | ICD-10-CM

## 2021-03-18 NOTE — Telephone Encounter (Signed)
Pt had apt with Debbie this morning and asked to speak with nurse. She reports getting bad news the other night so she too 2 of the 25 mg capsule Librium to sleep. She reports sleeping much better and felt better the next day. Only side effect she has noticed is a slight headache in the am but goes away quickly. Asking if okay to increase her dose? If it is she will need a new Rx.  Informed her I would check with Dr. Clovis Pu and call her back.

## 2021-03-18 NOTE — Progress Notes (Signed)
[facility] Counselor/Therapist Progress Note  Patient ID: Megan Whitney, MRN: [id number],    Date: 03/18/2021  Time Spent: 50 minutes   Treatment Type: Individual Therapy  Reported Symptoms: anxiety she identifies as stronger symptom but "some better", concerned about potential health issue with daughter, "up and down relationship with alcoholic husband, depressed, fearful thoughts at times, sometimes less motivated, Denies any SI.  Mental Status Exam:  Appearance:   Casual     Behavior:  Appropriate, Sharing, and Motivated  Motor:  Normal  Speech/Language:   Clear and Coherent and Normal Rate  Affect:  Anxious, depressed  Mood:  anxious and depressed  Thought process:  goal directed  Thought content:    obsessiveness  Sensory/Perceptual disturbances:    WNL  Orientation:  oriented to person, place, time/date, situation, day of week, month of year, year, and stated date of March 18, 2021  Attention:  Fair  Concentration:  Fair  Memory:  [place] of knowledge:   Good  Insight:    Good and Fair  Judgment:   Good and Fair  Impulse Control:  Good and Fair   Risk Assessment: Danger to Self:  No Self-injurious Behavior: No Danger to Others: No Duty to Warn:no Physical Aggression / Violence:No  Access to Firearms a concern: No  Gang Involvement:No   Subjective: Patient today reporting anxiety "not quite as strong" although is her stronger symptom per her report, also depression, some fearful thoughts, concern re: potential health issue with daughter, and "up and down" relationship with alcoholic  husband. Denies and SI.  No panic since last appt. . States she stopped taking her Celebrex for arthritis because she read that is can cause depression.  States she's done better since going off of it. Processed more detail her anxious/fearful thought re: daughter, husband's cancer, and "just things in the world with so much going on."  Discussed each 1 of these situations with  patient, addressing her fears and helping her look at alternate ways of viewing them, that are more reality based and do not automatically induce panic and anxiety, helping to keep her in the present.  With above-named stressors, is tending to assume worst case scenarios.  *Was having sleep issues but Has slept better taking 2 Librium and she is checking with nurse here today about whether she can take 2 instead of 1.   Interventions: Cognitive Behavioral Therapy and Ego-Supportive  Diagnosis:   ICD-10-CM   1. Bipolar I disorder with rapid cycling ([place])  F31.9        Treatment Goal Plan of Care: Patient not signing tx plan on computer screen due to [place]. Treatment Goals: Goals remain on plan as patient works on strategies to meet her goals.  Progress is noted each visit in "Progress" section of goal plan. Long Term Goal: Reduce overall level, frequency, and intensity of the anxiety so that daily functioning is not impaired. Short Term Goal: 1.Increase understanding of the beliefs and messages that produce the worry and anxiety. Strategies: 1.Help client develop reality-based positive cognitive messages/self-talk. 2. Develop a "coping card" or other reminder which coping strategies are recorded for patient's later use .    Plan:  Patient today showing good motivation even under a lot of stress, as noted above.  Worked well in session and showed some increased strength.  No tearfulness today at all, despite dealing with some sensitive issues.  Trying not to jump to negative conclusions and situations, trying to have  more determination and follow-through, and believe in herself more.  Encouraged patient to continue with some behaviors that have proven to be helpful to her in between appointments including intentionally looking for more positives and negatives each day, finding the positives within herself, challenging her anxious/fearful thoughts and replacing them with more  realistic/empowering thoughts, focus on the present and what she can control, remain in touch with supportive people, stay in contact with her church friends and involved in church groups, stop self negating, stop assuming worst case scenarios, practice consistent positive self talk, stay involved with her knitting group, get outside daily, decrease overthinking and overanalyzing, practice healthy boundaries with others as needed, allow her faith to be a source of emotional support as well as spiritual, and to recognize the strength she is showing as she works with goal-directed behaviors in the midst of challenging and changing circumstances to move forward in a more positive direction.  Goal review and progress/challenges noted with patient.  Next appointment within 2 weeks.   Shanon Ace, LCSW

## 2021-03-20 NOTE — Telephone Encounter (Signed)
This is a high dose of Librium and she cannot stay on it longterm.  Can take it a week and then cut back rto 1 capsule.  Be careful about driving on it.

## 2021-03-22 NOTE — Telephone Encounter (Signed)
Pt informed

## 2021-03-25 ENCOUNTER — Ambulatory Visit: Payer: Medicare Other | Admitting: Psychiatry

## 2021-03-31 ENCOUNTER — Ambulatory Visit: Payer: Medicare Other | Admitting: Psychiatry

## 2021-03-31 ENCOUNTER — Other Ambulatory Visit: Payer: Self-pay

## 2021-03-31 DIAGNOSIS — F319 Bipolar disorder, unspecified: Secondary | ICD-10-CM | POA: Diagnosis not present

## 2021-03-31 NOTE — Progress Notes (Signed)
[facility] Counselor/Therapist Progress Note  Patient ID: Megan Whitney, MRN: [id number],    Date: 03/31/2021  Time Spent: 50 minutes   Treatment Type: Individual Therapy  Reported Symptoms: anxiety, depression, some anger, (depression is stronger symptom)  Mental Status Exam:  Appearance:   Casual     Behavior:  Appropriate  Motor:  WNL  Speech/Language:   Clear and Coherent  Affect:  Depressed, anxious  Mood:  anxious and depressed  Thought process:  goal directed  Thought content:    Some obsessiveness  Sensory/Perceptual disturbances:    WNL  Orientation:  oriented to person, place, time/date, situation, day of week, month of year, year, and stated date of March 31, 2021  Attention:  Good  Concentration:  Good and Fair  Memory:  WNL  Fund of knowledge:   Good  Insight:    Fair  Judgment:   Good and Fair  Impulse Control:  Good   Risk Assessment: Danger to Self:  No Self-injurious Behavior: No Danger to Others: No Duty to Warn:no Physical Aggression / Violence:No  Access to Firearms a concern: No  Gang Involvement:No   Subjective:   Patient in today reporting depression, anxiety, and anger. Anger related to husband, knitting group, and her church and new pastor, all of which she processed in session today. Making negative assumptions, assuming the worst case scenarios, but is positive is saying her husband has remained off alcohol approx 6 weeks. Wanted to work further on some issues re: her stability and how she "can be better and then lose it so quickly.  Adds "that she does try to follow through on strategies, but hard to maintain."  Seems to be no consistent follow-through and practicing strategies and we discussed how important this can be in developing better habits for coping.  States that she knows she needs to concentrate more on positives, stay in the present, not assume the worst, and interrupt negative thoughts in order to replace them, all of which  over time we have worked on in sessions, but again the missing part is her consistent follow-through, and also acknowledged the ups and downs that occur with bipolar disorder.  Discussed ways of being more consistent in working with strategies and suggestions and especially some of her anxious/depressive/negative thoughts that emerge.  She is going to work with some of this is homework between sessions and we will pick up next visit.     Interventions: Cognitive Behavioral Therapy and Solution-Oriented/Positive Psychology  Diagnosis:No diagnosis found.   Treatment Goal Plan of Care: Patient not signing tx plan on computer screen due to [place]. Treatment Goals: Goals remain on plan as patient works on strategies to meet her goals.  Progress is noted each visit in "Progress" section of goal plan. Long Term Goal: Reduce overall level, frequency, and intensity of the anxiety so that daily functioning is not impaired. Short Term Goal: 1.Increase understanding of the beliefs and messages that produce the worry and anxiety. Strategies: 1.Help client develop reality-based positive cognitive messages/self-talk. 2. Develop a "coping card" or other reminder which coping strategies are recorded for patient's later use .     Plan:  Patient today showing lower motivation initially but it got better as the session progressed.  She actually brought up the subject of how she can get better but then does not practice what we talked about in sessions on the outside and therefore it is hard for her to hold onto new ways of doing  things or looking at things that might be healthier for her.  She is committed to working on this between sessions now and admits that is an area she needs to strengthen.  No tearfulness today.  Recognizing some of her need areas better.  Encouraged her to continue with some behaviors that have proven to be helpful in the past including: Looking for positives within herself, looking for  more positives than negatives daily, focusing on the present and what she can control, remaining in touch with supportive people, interrupting her anxious/fearful/depressive thoughts and replacing them with more reality based and empowering thoughts, remain in contact with her church friends and involved in church groups, stop assuming worst case scenarios, stop self negating, stay involved with her knitting group, get outside daily and walk as she is able, practice consistent positive self talk, maintain healthy boundaries with others as needed, decrease overthinking and overanalyzing, allow her faith to be a source of emotional support as well as spiritual, and to feel good about the strength she is showing as she works with goal-directed behaviors in the midst of challenging circumstances to move forward in a more positive direction of improved emotional health and stability.  Goal review and progress/challenges noted with patient.  Next appointment within 2 weeks.   Shanon Ace, LCSW

## 2021-04-08 ENCOUNTER — Ambulatory Visit: Payer: Medicare Other | Admitting: Psychiatry

## 2021-04-14 ENCOUNTER — Telehealth: Payer: Self-pay | Admitting: Psychiatry

## 2021-04-14 NOTE — Telephone Encounter (Signed)
Pt would like to know if its ok for her to stop by and pick up 3 boxes of Vraylar 3mg . Pt has appt with [name] tomorrow and would like to pick them up then.

## 2021-04-15 ENCOUNTER — Ambulatory Visit: Payer: Medicare Other | Admitting: Psychiatry

## 2021-04-15 ENCOUNTER — Other Ambulatory Visit: Payer: Self-pay

## 2021-04-15 ENCOUNTER — Other Ambulatory Visit: Payer: Self-pay | Admitting: Psychiatry

## 2021-04-15 ENCOUNTER — Other Ambulatory Visit: Payer: Self-pay | Admitting: Internal Medicine

## 2021-04-15 DIAGNOSIS — F319 Bipolar disorder, unspecified: Secondary | ICD-10-CM

## 2021-04-15 DIAGNOSIS — E7849 Other hyperlipidemia: Secondary | ICD-10-CM

## 2021-04-15 NOTE — Progress Notes (Signed)
[facility] Counselor/Therapist Progress Note  Patient ID: Megan Whitney, MRN: [id number],    Date: 04/15/2021  Time Spent: 55 minutes   Treatment Type: Individual Therapy  Reported Symptoms: anxiety, depression, irritability that sometime turns into anger  Mental Status Exam:  Appearance:   Casual     Behavior:  Appropriate, Sharing, and Motivated  Motor:  Normal  Speech/Language:   Clear and Coherent  Affect:  Anxious, depression  Mood:  anxious and depressed  Thought process:  goal directed  Thought content:    Obsessive thoughts  Sensory/Perceptual disturbances:    WNL  Orientation:  oriented to person, place, time/date, situation, day of week, month of year, year, and stated date of Aug. 11, 2022  Attention:  Fair  Concentration:  Fair  Memory:  [place] of knowledge:   Good  Insight:    Good  Judgment:   Good and Fair  Impulse Control:  Good and Fair   Risk Assessment: Danger to Self:  No Self-injurious Behavior: No Danger to Others: No Duty to Warn:no Physical Aggression / Violence:No  Access to Firearms a concern: No  Gang Involvement:No   Subjective:  Patient in today reporting depression, anxiety, and irritability. Crying more at home due to issues with husband and admittedly "looking only at what might go right versus wrong" which we discussed and worked with some today.  Followed up on homework assignment re: assuming the negatives in situation. Has previously had difficulty following through with strategies to make positive changes, but this time patient has followed up and did a good job with her homework. Has also signed up to begin [employer] later in the Fall. Husband has remained off alcohol 8 weeks. Acknowledges some of her gains most recently despite still struggling with depression and anxiety and irritability.  Worked more consistently in session today and staying on topic and addressing her tendency to look for negatives and assume  that things will go in a negative direction.  Her homework will revolve around interrupting her anxious thoughts, and replacing them with more reality based and empowering thoughts, and pay attention to how that affects her mentally/emotionally.    Interventions: Cognitive Behavioral Therapy, Solution-Oriented/Positive Psychology, and Ego-Supportive  Diagnosis:   ICD-10-CM   1. Bipolar I disorder with rapid cycling ([place])  F31.9       Treatment Goal Plan of Care: Patient not signing tx plan on computer screen due to [place]. Treatment Goals: Goals remain on plan as patient works on strategies to meet her goals.  Progress is noted each visit in "Progress" section of goal plan. Long Term Goal: Reduce overall level, frequency, and intensity of the anxiety so that daily functioning is not impaired. Short Term Goal: 1.Increase understanding of the beliefs and messages that produce the worry and anxiety. Strategies: 1.Help client develop reality-based positive cognitive messages/self-talk. 2. Develop a "coping card" or other reminder which coping strategies are recorded for patient's later use .    Plan:  Patient today showing moderate motivation which improved during this session to become stronger as she worked on some relationship issues involving husband but also on trying to change her longtime habit of assuming worse case scenarios that get her very upset and makes it hard for her to manage any stressors.  She was very active and engaged in this task and seemed to have more insight by the end of session.  We talked about ways of her maintaining some of her  more reality-based thoughts and be able to continue working to interrupt the pattern of assuming the negatives or worst case scenarios.  Recognizing some gains for herself.  There was no tearfulness throughout the whole session today although she reports recent crying at home.  Encouraged her to continue practicing behaviors that have been  helpful previously including: Focusing on the present and what she can control, looking for positives within herself, looking for more positives than negatives in the world daily, taking a break from her electronics daily, remaining in touch with supportive people, interrupting her anxious/fearful/depressive thoughts and replacing them with more reality-based and empowering thoughts, remain in contact with her church friends and involved in church groups, as noted above stop assuming worst case scenarios, stop self negating, stay involved with her knitting group, get outside daily and walk as she is able, practice consistent positive self talk, maintain healthy boundaries with others as needed, decrease overthinking over analyzing, allow her faith to be a source of emotional support as well as spiritual, and to feel good about the strength she is showing as she works with goal-directed behaviors in the midst of challenging circumstances to move forward in a more positive direction of improved emotional health and stability.  Goal review and progress/challenges noted with patient.  Next appointment within 2 weeks.   Shanon Ace, LCSW

## 2021-04-19 ENCOUNTER — Encounter: Payer: Self-pay | Admitting: Psychiatry

## 2021-04-19 ENCOUNTER — Telehealth (INDEPENDENT_AMBULATORY_CARE_PROVIDER_SITE_OTHER): Payer: Medicare Other | Admitting: Psychiatry

## 2021-04-19 DIAGNOSIS — G3184 Mild cognitive impairment, so stated: Secondary | ICD-10-CM | POA: Diagnosis not present

## 2021-04-19 DIAGNOSIS — F900 Attention-deficit hyperactivity disorder, predominantly inattentive type: Secondary | ICD-10-CM

## 2021-04-19 DIAGNOSIS — R251 Tremor, unspecified: Secondary | ICD-10-CM

## 2021-04-19 DIAGNOSIS — F411 Generalized anxiety disorder: Secondary | ICD-10-CM

## 2021-04-19 DIAGNOSIS — F319 Bipolar disorder, unspecified: Secondary | ICD-10-CM

## 2021-04-19 DIAGNOSIS — G2401 Drug induced subacute dyskinesia: Secondary | ICD-10-CM

## 2021-04-19 DIAGNOSIS — R7989 Other specified abnormal findings of blood chemistry: Secondary | ICD-10-CM

## 2021-04-19 NOTE — Progress Notes (Addendum)
Megan Whitney [id number] 09-25-1955 65 y.o.   Video Visit via My Chart  I connected with pt by My Chart and verified that I am speaking with the correct person using two identifiers.   I discussed the limitations, risks, security and privacy concerns of performing an evaluation and management service by My Chart  and the availability of in person appointments. I also discussed with the patient that there may be a patient responsible charge related to this service. The patient expressed understanding and agreed to proceed.  I discussed the assessment and treatment plan with the patient. The patient was provided an opportunity to ask questions and all were answered. The patient agreed with the plan and demonstrated an understanding of the instructions.   The patient was advised to call back or seek an in-person evaluation if the symptoms worsen or if the condition fails to improve as anticipated.  I provided 30 minutes of video time during this encounter.  The patient was located at home and the provider was located office. Session started at 2:00 and ended at 230   Subjective:   Patient ID:  Megan Whitney is a 65 y.o. (DOB 1956/08/14) female.   Chief Complaint:  Chief Complaint  Patient presents with   Follow-up   Bipolar I disorder with rapid cycling ([place])   Depression   Anxiety   Manic Behavior    Depression        Associated symptoms include decreased concentration.  Associated symptoms include no suicidal ideas.  Past medical history includes anxiety.   Anxiety Symptoms include confusion, decreased concentration and nervous/anxious behavior. Patient reports no dizziness, nausea, palpitations or suicidal ideas.    Medication Refill Associated symptoms include arthralgias. Pertinent negatives include no nausea or weakness.  Megan Whitney is  follow-up of r chronic mood swings and anxiety and frequent changes in medications.   At visit December 27, 2018.  Focalin XR was  increased from 20 mg to 25 mg daily to help with focus and attention and potentially mood.  When seen February 13, 2019.  In an effort to reduce mood cycling we reduce fluoxetine to 20 mg daily.  At visit August 2020.  No meds were changed.  She continued the following: Focalin XR 25 mg every morning and Focalin 10 mg immediate release daily Equetro 200 mg nightly Fluoxetine 20 mg daily Lamotrigine 200 mg twice daily Lithium 150 mg nightly Vraylar 3 mg daily  She called back November 4 after seeing her therapist stating that she was having some hypomanic symptoms with reduced sleep and increased energy.  This potentiality had been discussed and the decision was made to increase Equetro from 200 mg nightly to 300 mg nightly.  seen August 12, 2019.  Because of balance problems she did not tolerate Equetro 300 mg nightly and it was changed to Equetro 200 mg nightly plus immediate release carbamazepine 100 mg nightly.  Her mood had not been stable enough on Equetro 200 mg nightly alone. Less balance problems with change in CBZ.  seen September 23, 2019.  The following was changed: For bipolar mixed increase CBZ IR to 200 mg HS.  Disc fall and balance risks.For bipolar mixed increase CBZ IR to 200 mg HS.  Disc fall and balance risks.  She called back October 23, 2019 stating she had had another fall and felt it was due to the medication.  Therefore carbamazepine immediate release was reduced from 200 mg nightly to 100 mg nightly.  The  Equetro is unchanged.  Last seen November 04, 2019.  The following was noted:  Better at the moment but balance is still somewhat of a problem.  Started PT to help balance.  Had a fall after tripping on a curb and hit her head on sidewalk.  Got a concussion with nausea and HA and dizziness and light sensitivity.  Not over it.  Concentration problems.  Has gotten back to work after a week.   Mood sx pretty good with some mild depression.  Nothing severe.  Trying to minimize  stress and self care as much as possible.  No manic sx lately and sleeping fairly well.  No racing thoughts.   Working another year and plans to retire but H alcoholic and not sure it will be good to be there all the time. Seeing therapist q 2 weeks.  Therapy helping . Recent serum vitamin D level was determined to be low at 33.  The goal and chronically depressed patient's is in the 50s if possible.  So her vitamin D was increased on August 08, 2018 or thereabouts.  Checked vitamin D level again and this time it was high at 120 and so it was stopped.  She's restarted per PCP at 1000 units daily.  01/06/2020 appointment the following is noted: Still on: Focalin XR 25 mg every morning and Focalin 10 mg immediate release daily Equetro 200 mg nightly Carbamazepine immediate release 100 mg nightly Fluoxetine 20 mg daily Lamotrigine 200 mg twice daily Lithium 150 mg nightly Vraylar 3 mg daily Not good manic.  Angry.  Missed 2 days bc sx.  Last week vacation which didn't go well.  Crying last week and missed a day.  "Pissed off at the whole world" but also depressed and hard to get OOB today.  Everything makes me angry.   Blows up without control.  Then regrets it.  Sleep irregular lately. Finished PT which might have helped some but still balance problems. Plan: Cannot increase carbamazepine due to balance issues Temporarily Ativan for agitation 0.5 mg tablets  DT mania stop fluoxetine If fails trial loxapine  01/15/2020 patient called after hours with suicidal thoughts and patient was to go to the [facility]. Patient ultimately admitted to [facility] psychiatric unit.  Dr. Clovis Pu spoke with clinical pharmacist they are giving history of medication experience and recommendation for loxapine.  Patient hospital stay for 3 days and discharged on loxapine 10 mg nightly as the new medication.  02/10/2020 phone call patient complaining of insomnia.  Loxapine was  increased from 10 to 20 mg nightly due to recent insomnia with mania.  02/14/2020 appointment with the following noted: Lately in tears Monday and Tuesday convinced she couldn't do her job.  Better last couple of days.  Motivation is not real good but not depressed like Monday and Tuesday. This week missing some meds bc couldn't get like Focalin.  Been taking other meds. No sig manic sx.  Sleep is better with more loxapine about 8 hours. Anxiety is chronic.  No SE loxapine so far unless a little dizzy here and there. No med changes.  02/25/2020 appointment urgently made after patient was recently hospitalized.  The following is noted: Unstable.  Today manic driving erratically.  Talking a mile a minute.  Not thinking clearly.  Angry.  Slept OK last night.  Hyperactive with poor productivity for a couple of days.  Weekend OK overall.   No falls lately. More tremor lately.  Retiring July 30.  Plan: For tremor amantadine 100 mg twice a day if needed. Increase loxapine to 3 capsules 1 to 2 hours before bedtime Reduce Vraylar to 1.5 mg daily or 3 mg every other day.   04/01/2020 appointment with the following noted: Amantadine hs caused NM. Low grade depression for a couple of weeks.  Not severe.   Extended work date 06/04/20 to retire date.  She feels OK about it in some ways but doesn't feel fully up to it.  Doesn't remember when hypomania resolved from last visit.   Sleep is much better now uninterrupted. Hard to remember lithium at lunch. Still has tremor but better with amantadine.  Anxiety still through the roof. Plan: Increase loxapine 40 mg HS.  05/04/20 appt with the following noted:  Increased loxapine to 40.  Anxiety no better.  All kinds of reasons including worry about retirement and paying for things, but worry is probably exaggerated and H say sit is. Sleep good usually.  No SE noted.  Not making her sleep more with change. Still some manic sx including shortly after last visit  and then depressed until the last week.  Irritable and angry. Some panic with SOB and fear of MI. Plan: Continue Vraylar 1.5 mg every day (conisder reduction) Increase loxapine to 50 mg daily for 1 week and if no improvement then increase to 75 mg each night (or 3 of the 25 mg capsules)  Multiple phone calls between appointments with the patient complaining loxapine was causing insomnia.  She has adjusted on timing and dose as she felt it was necessary to make it tolerable because when she takes it in the morning she gets sleepy if she takes very much.  06/09/20 appt Noted: Max tolerated loxapine 25 mg BID.  More than that HS gives strange dreams and difficult to go back to sleep and more in AM too sedated. Not doing well.  Anxiety through the roof.  Did ok with vacation but home worries about everything.   Retired.  Has a lot of time to generally worry.  Started reading again for the first time in awhile.  That's helpful. Takes a while to adjust to retirement.  Anxiety and depression feed each other.  Less interest in some activities.  Later in afternoon is not quite as anxious. Hard to drive with anxiety.   Plan: Reduce to see if it helps reduce anxiety.  Focalin XR 20 mg every morning  and stop Focalin 10 mg immediate release daily Equetro 200 mg nightly Carbamazepine immediate release 100 mg nightly Lamotrigine 200 mg twice daily Lithium 150 mg nightly Continue Vraylar 1.5 mg every day (conisder reduction) continue loxapine to 25 mg BID for longer trial.  07/07/20 appt with the following noted: Tearful and overwhelmed  By [facility] dx of prostate CA with mets bones and nodes with plans for hormone tx and radiation and chemotherapy.  Found out about 3 weeks ago.   He's in sig pain and she's caregiving.  Hard for him to walk even on walker.  Is falling to pieces but realizes it's typical but bc bipolar may be affecting her harder.  Tearful a lot.  Forgetting things, distracted, personal routine  disrupted. She still feels the focalin is helpful.  Poor sleep last night bc H but usually 7-8 hours. No effect noticed from Amantadine for tremor. CO more depressed. Plan: Option treat tremor.  change amanatadine 100 mg AM to pramipexole to try to help tremor and mood off label.  Disc risk mania.  She wants to do it..  07/14/2020 phone call:Megan Whitney called to report that she will be starting Medicare as of January, 2022.  She will be on regular medicare A&B and prescription plan D.  Her Vraylar and [place] Mc will NOT be covered by medicare.  She needs to know if there are other medications to replace these.  The cost for these medications is over $6000 and she can't afford that price.  She has an appt 12/2, but needs to know asap if there are going to be alternate medications and what they are so she can check on coverage. MD response: There are no reasonable alternatives to these medications that will work in the same way.  She needs to get a better Medicare D plan that will cover the Vraylar and Equetro or her psychiatric symptoms will get worse if she stops these medications.  There are better Medicare D plans that we will cover these medicines but obviously those plans are more expensive but I can have no control over that.  08/06/2020 appointment with the following noted: Tremor no better and maybe worse with switch from to pramipexole 0.125 mg BID from Amantadine.  No SE. Depressed and anxious and crying a lot.  Hard to tell if related to H.  Anxiety definitely related to H.  H can't do very much bc pain and on pain meds and anemic.  Transfusion yesterday.  H can't drive or shop.  Too weak.  Says she can't find a medicare plan that will cover Equetro and [employer]. Plan: She wants to continue 10 mg immediate release Focalin daily but try skipping to see if anxiety is better. Increase pramipexole to try to help tremor and mood off label.  Disc risk mania.  She wants to do it.. Increase to 0.5 mg  BID.  09/07/2020 appointment with the following noted: At last appointment patient was more depressed and anxious and complaining of tremor.  Additional stress with husband's cancer and poor health. Severe anger problems with 0.5 mg BID and mood swings on pramipexole after a week.  Reduced to 0.5 mg AM and still having the problem. Helped tremor tremdously at the higher dose and worse with lower dose.  Tremor same all day except worse with stress.   Stopped Focalin IR without change. Things have been tough and dealing with depression.  H's cancer really affecting me.  Causing depression and anxiety and often in tears.  Able to care for herself and H.  He doesn't require a lot of care but she's not strong emotionally.   Now on [facility] and worry over med coverage. Plan: So wean and stop it loxapine due to NR and intolerance of higher dose.    09/11/2020 phone call that new Medicare plan would not cover Focalin XR and it was switched to Focalin 10 mg twice daily.  Also informed of high cost of Vraylar with new plan. MD response: As I told her at the last visit, there is nothing similar to Vraylar that is generic.  That is why I suggested she select an insurance plan that would cover it..  Reduce Vraylar that she has remaining to 1 every 3rd day until she runs out.  She may feel OK for awhile without it bc it gets out of the body slowly.  We'll see how she's doing at her visit next month   09/25/2020 phone call from patient saying she was more depressed since tapering off the Vraylar including disorganized thinking  and lack of motivation. MD response: Pt got some samples.  However she was warned before switch to Medicare to make sure plan adequately covered Vraylar.   She didn't do this.   We tried all reasonable alternatives to Vraylar which either failed or caused intolerable SE.  I  cannot fix this problem for her.  She will inevitably worsen when she stops an effective tolerated med.  10/07/2020 patient  called back stating she wanted to restart loxapine.  10/19/2020 appointment with the following noted: Says none of Medicare D plans cover Vraylar except with high copay of $400/month. Won't be able to stay on it but is taking some of the Vraylar now.   Currently on Vraylar 1.5 mg daily but that won't last and she'll have to stop it.  Has cut back and feels more depressed markedly. She decided the loxapine was helping some and wanted to restart loxapine 25 mg in AM.  Makes her sleepy.    Paying $90 monthly for Equetro. But had balance probles with CBZ ER. Wasn't taking lithium for a long while and restarted 150 mg HS. Wants to stay on librium 25 mg HS bc it helps sleep but insurance won't pay for it either. Plan: Switch   Focalin XR 20 mg  To IR 15 mg BID DT Cost and off label for depression   Continue the Vraylar as long as she can until she runs out. Pending neurology evaluation  11/16/2020 Telephone call with [facility] neurology PA that saw the patient today.  Reviewed the long unstable history of bipolar disorder and multiple previous med trials.   Neurology see some EPS likely related to [place].  However they also would like to consider either Ingrezza or Austedo given her multiple failures of meds for tremor and EPS.  They suspect some TD type symptoms.  They will discuss this with the patient. Discussed the neurology evaluation at length.  The note is not accessible at this time in epic. Kofi A. Doonquah, MD noted at time tremor was minimal but suspected EPS and TD DT toes wiggling and teeth grinding. We will defer any changes such as Austedo or Ingrezza because of the risk of worsening parkinsonism until the patient is stable on Vraylar dosing.  11/17/2020 appointment with the following noted: Frustrated tremor got better in the last week for no apparent reason. Church gave them money so taking the [employer] daily for 3 weeks and it's a "huge difference" with depression much better but not  gone. So stopped loxapine.   12/21/2020 appointment with the following noted:  Able to stay on Vraylar 1.5 mg daily but still having depression and hard to function.  Not sure why that is unless dealing with H's cancer.  H had some good news with pending bone scan and Cat scan.  Now he's having a lot of pain even on pain meds.  He's also started drinking again and that worries her.  Therefore worried.   Retired.  So mind is freer to worry but trying to stay active.   Tolerating the meds well.  Tremor is better than it was, but worse with stress.   Hygiene is not as good as usual for showering. Able to stay on Focalin 15 mg BID usually.  No SE other than tremor. Sleep is pretty good usually. Plan: No med changes.  She is having to use Vraylar samples because of the cost of the medicine.  01/21/2021 appointment with the following noted: Able to purchase Vraylar and  samples to spread it out.  $327/30 caps. Taking 1.5 mg daily.  Suffering depression still.  SI last week and so depressed.   2 nights ago ? Manic yelling, cursing and screaming for several hours and evened out the next day seeing therapist. SE seem pretty well with minimal tremors.  Still mouth movements about the same.  Grimaces a good amount.   Thinks she is rapid cycling. Assessment plan: More depressed with less Vraylar. Continue   Focalin XR 20 mg  To IR 15 mg BID DT Cost and off label for depression   Equetro 200 mg nightly Carbamazepine immediate release 100 mg nightly Lamotrigine 200 mg twice daily Lithium 150 mg nightly Increase Vraylar to 1.5mg  alternating with 3 mg every other day to improve recent depressive and manic sx.  02/24/2021 appointment with the following noted: Increase Vraylar but not much difference. Still cycles from even to irritable to depressed.  Sometimes in the same day but typically a few days in a row.  Irritable depressed days are the most frequent.   Would like to get rid of this.  Still  intermittent SI without plan or intent.  Still cry often usually over fear of future bc of H's cancer. H says sometimes is confused and other days is very clear.  No reason known. Consistent with meds. Sleep variable with recent bad dreams and restless sleep.   No SE with Vraylar. H thought she was manic a couple of weekends ago with family visiting.  But when I'm in those stages I don't see it. Still getting together with friends. Plan: Continue   Focalin XR 20 mg  To IR 15 mg BID DT Cost and off label for depression   Equetro 200 mg nightly Carbamazepine immediate release 100 mg nightly Lamotrigine 200 mg twice daily Lithium 150 mg nightly Continue Librium 25 HS bc needed for sleep Increase Vraylar to 3 mg every day to improve recent depressive and manic sx.  04/19/21 appt noted: Pretty well except still depression anxiety and stress but definitely better than before increase Vraylar.  Better function and motivation and concentration. No SE with 3mg  so far except tremor in R hand worse. Stress H CA and more isolated now that retired. Started exercise group Tues at church.  Leading it for a couple of weeks.  It helps. Sleep 10-8 but awakens briefly. Continues therapy. Started Focalin 20 mg in AM bc forgettting afternoon dose. Can keep going in the afternoon. No new health problems. Asks about something for anxiety during the day.   Past Psychiatric Medication Trials: Vraylar 4.5 SE mouth movements reduced to 3 mg 3/20. It was effective at lower doses for depression.  Worse off it.  Vraylar 1.5 mg every third day led to relapse of significant depression. lithium 150,   Trileptal 450, Depakote, Equetro 300 balance issues, CBZ ER falling,   Lamictal 200 twice daily, Latuda 80, , olanzapine, Seroquel, risperidone, Abilify, loxapine 25 mg BID (max tolerated) Focalin,  Ritalin,  fluoxetine 60,  buspirone, sertraline 100, Wellbutrin history of facial tics, paroxetine cognitive side  effects ropinirole, amantadine, Sinemet, Artane, Cogentin,  pramipexole 0.5 mg BID helped tremor but caused anger trazodone hangover, Ambien hangover,  Review of Systems:  Review of Systems  HENT:  Positive for dental problem and tinnitus.        Chirping cricket sounds in hears since January  Cardiovascular:  Negative for palpitations.  Gastrointestinal:  Negative for nausea.  Genitourinary:  Negative for dysuria.  Musculoskeletal:  Positive for arthralgias. Negative for gait problem.  Neurological:  Positive for tremors. Negative for dizziness, seizures, syncope, weakness and light-headedness.       Still balance problems. No falls lately. Tremor is worse in hands  Psychiatric/Behavioral:  Positive for confusion, decreased concentration, depression and dysphoric mood. Negative for agitation, behavioral problems, hallucinations, self-injury, sleep disturbance and suicidal ideas. The patient is nervous/anxious. The patient is not hyperactive.  No falls since here. Not currently depressed but unable to remove this from the list.  Medications: I have reviewed the patient's current medications.  Current Outpatient Medications  Medication Sig Dispense Refill   atorvastatin (LIPITOR) 20 MG tablet TAKE (1) TABLET BY MOUTH AT BEDTIME. 90 tablet 0   cariprazine (VRAYLAR) 3 MG capsule Take 1 capsule (3 mg total) by mouth daily. 30 capsule 1   celecoxib (CELEBREX) 50 MG capsule Take 1 capsule (50 mg total) by mouth 2 (two) times daily. 60 capsule 0   cholecalciferol (VITAMIN D) 25 MCG (1000 UNIT) tablet Take 1,000 Units by mouth daily.     EQUETRO 200 MG CP12 12 hr capsule TAKE (1) CAPSULE BY MOUTH AT BEDTIME. 30 capsule 0   ferrous gluconate (FERGON) 324 MG tablet Take 324 mg by mouth daily with breakfast.     pantoprazole (PROTONIX) 40 MG tablet Take 1 tablet (40 mg total) by mouth daily. 90 tablet 2   carbamazepine (TEGRETOL) 100 MG chewable tablet CHEW 1 TABLET BY MOUTH AT BEDTIME. 90 tablet  0   cetirizine (ZYRTEC) 10 MG tablet Take 1 tablet (10 mg total) by mouth daily. 90 tablet 3   chlordiazePOXIDE (LIBRIUM) 25 MG capsule TAKE (1) CAPSULE BY MOUTH AT BEDTIME AS NEEDED FOR ANXIETY 30 capsule 0   dexmethylphenidate (FOCALIN) 10 MG tablet TAKE 1 AND 1/2 TABLETS BY MOUTH TWICE DAILY. 90 tablet 0   docusate sodium (COLACE) 100 MG capsule Take 100 mg by mouth. 1 tab every other day.     doxycycline (VIBRA-TABS) 100 MG tablet Take 1 tablet (100 mg total) by mouth 2 (two) times daily. 14 tablet 7   fluticasone (FLONASE) 50 MCG/ACT nasal spray Use 2 sprays in each nostril BID for a week. After 1 week, decrease to 1 spray in each nostril BID as needed for congestion/allergies. 16 g 6   lamoTRIgine (LAMICTAL) 200 MG tablet TAKE (1) TABLET BY MOUTH TWICE DAILY. 60 tablet 0   lithium carbonate 150 MG capsule Take 1 capsule (150 mg total) by mouth at bedtime. 1 tab at bedtime 90 capsule 1   Loratadine 10 MG CAPS Take by mouth.     nitrofurantoin, macrocrystal-monohydrate, (MACROBID) 100 MG capsule Take 1 capsule (100 mg total) by mouth 2 (two) times daily. 10 capsule 0   No current facility-administered medications for this visit.    Medication Side Effects: Other: tremor and weight gain.   Dyskinesia appears better  SE bettter than they were.  Balance problems intermittently  Allergies:  Allergies  Allergen Reactions   Azithromycin Anaphylaxis   Penicillins Anaphylaxis    DID THE REACTION INVOLVE: Swelling of the face/tongue/throat, SOB, or low BP? Yes Sudden or severe rash/hives, skin peeling, or the inside of the mouth or nose? Yes Did it require medical treatment? No When did it last happen?       If all above answers are "NO", may proceed with cephalosporin use.   Adhesive [Tape] Other (See Comments)    On bandaids    Past Medical History:  Diagnosis Date  ADD (attention deficit disorder)    Allergy    Seasonal   Anemia    History of GI blood loss   Anxiety    Arthritis     Atrophy of vagina 10/07/2020   Bipolar 1 disorder (HCC)    Cancer ([place])    Colon polyps    Depression    Diabetes mellitus ([place])    Edema, lower extremity    Epistaxis    Around 2011 or 2012, required cauterization.    Esophageal stricture    Fracture of superior pubic ramus (HCC) 11/28/2018   GERD (gastroesophageal reflux disease)    Headache(784.0)    Hyperlipidemia    Interstitial cystitis    Joint pain    Lactose intolerance    Lung cancer ([place]) 2002   Obesity    Osteoarthritis    Palpitations    Sleep apnea    Doesn't use a CPAP   Suicidal ideation 01/20/2020   Swallowing difficulty     Family History  Problem Relation Age of Onset   Arthritis Mother    Hearing loss Mother    Hyperlipidemia Mother    Hypertension Mother    Depression Mother    Anxiety disorder Mother    Obesity Mother    Sudden death Mother    Hypertension Father    Diabetes Mellitus II Father    Heart disease Father    Arthritis Father    Cancer Father        Brain   COPD Father    Diabetes Father    Hyperlipidemia Father    Sleep apnea Father    Early death Sister        Aneroxia/Bulimic   Depression Brother    Early death Proofreader Accident   Depression Daughter    Drug abuse Daughter    Heart disease Daughter    Hypertension Daughter    Stroke Maternal Grandmother    Hypertension Maternal Grandmother    Arthritis Maternal Grandfather    Heart attack Maternal Grandfather    Hearing loss Maternal Grandfather    Colon cancer Neg Hx    Esophageal cancer Neg Hx    Rectal cancer Neg Hx     Social History   Socioeconomic History   Marital status: Married    Spouse name: Not on file   Number of children: 1   Years of education: Not on file   Highest education level: Not on file  Occupational History   Occupation: Admin. assistant  Tobacco Use   Smoking status: Never   Smokeless tobacco: Never  Vaping Use   Vaping Use: Never used  Substance and Sexual Activity    Alcohol use: Yes    Alcohol/week: 1.0 standard drink    Types: 1 Glasses of wine per week    Comment: Moderate   Drug use: No   Sexual activity: Yes  Other Topics Concern   Not on file  Social History Narrative   Pt lives in [place] with husband Lanny Hurst.  Followed by Dr. Clovis Pu for psychiatry and Rinaldo Cloud for therapy.   Social Determinants of Health   Financial Resource Strain: Medium Risk   Difficulty of Paying Living Expenses: Somewhat hard  Food Insecurity: No Food Insecurity   Worried About Charity fundraiser in the Last Year: Never true   Ran Out of Food in the Last Year: Never true  Transportation Needs: No Transportation Needs   Lack of Transportation (  Medical): No   Lack of Transportation (Non-Medical): No  Physical Activity: Inactive   Days of Exercise per Week: 0 days   Minutes of Exercise per Session: 0 min  Stress: No Stress Concern Present   Feeling of Stress : Only a little  Social Connections: Moderately Integrated   Frequency of Communication with Friends and Family: More than three times a week   Frequency of Social Gatherings with Friends and Family: More than three times a week   Attends Religious Services: More than 4 times per year   Active Member of [employer] or Organizations: No   Attends Archivist Meetings: Never   Marital Status: Married  Human resources officer Violence: Not At Risk   Fear of Current or Ex-Partner: No   Emotionally Abused: No   Physically Abused: No   Sexually Abused: No    Past Medical History, Surgical history, Social history, and Family history were reviewed and updated as appropriate.   Please see review of systems for further details on the patient's review from today.   Objective:   Physical Exam:  There were no vitals taken for this visit.  Physical Exam Constitutional:      General: She is not in acute distress. Musculoskeletal:        General: No deformity.  Neurological:     Mental Status: She is alert  and oriented to person, place, and time.     Cranial Nerves: No dysarthria.     Motor: Tremor present.     Coordination: Coordination normal.     Comments: Mild resting R hand rotational tremor Fidgety mildy with feet Very mild lip pursing.  Psychiatric:        Attention and Perception: Attention and perception normal. She does not perceive auditory or visual hallucinations.        Mood and Affect: Mood is anxious and depressed. Affect is not labile, blunt, angry or inappropriate.        Speech: Speech normal.        Behavior: Behavior normal. Behavior is cooperative.        Thought Content: Thought content normal. Thought content is not paranoid or delusional. Thought content does not include homicidal or suicidal ideation. Thought content does not include homicidal or suicidal plan.        Cognition and Memory: Cognition and memory normal.        Judgment: Judgment normal.     Comments: Insight intact More depressed but also cycling. No mania lately.    Lab Review:     Component Value Date/Time   NA 139 01/28/2021 0926   K 4.6 01/28/2021 0926   CL 104 01/28/2021 0926   CO2 20 01/28/2021 0926   GLUCOSE 91 01/28/2021 0926   GLUCOSE 102 (H) 01/19/2020 1158   BUN 28 (H) 01/28/2021 0926   CREATININE 0.87 01/28/2021 0926   CALCIUM 9.1 01/28/2021 0926   PROT 6.4 01/28/2021 0926   ALBUMIN 4.0 01/28/2021 0926   AST 16 01/28/2021 0926   ALT 13 01/28/2021 0926   ALKPHOS 64 01/28/2021 0926   BILITOT 0.2 01/28/2021 0926   GFRNONAA 83 03/02/2020 1433   GFRAA 96 03/02/2020 1433       Component Value Date/Time   WBC 8.4 01/28/2021 0926   WBC 7.4 01/19/2020 1158   RBC 4.47 01/28/2021 0926   RBC 4.19 01/19/2020 1158   HGB 13.2 01/28/2021 0926   HCT 40.4 01/28/2021 0926   PLT 231 01/28/2021 0926   MCV 90  01/28/2021 0926   MCH 29.5 01/28/2021 0926   MCH 30.3 01/19/2020 1158   MCHC 32.7 01/28/2021 0926   MCHC 32.1 01/19/2020 1158   RDW 13.0 01/28/2021 0926   LYMPHSABS 3.5 (H)  01/28/2021 0926   MONOABS 0.7 04/04/2019 1004   EOSABS 0.3 01/28/2021 0926   BASOSABS 0.1 01/28/2021 0926  Vitamin D level 33 on 10K units daily on 12/4/`9 Increased to prescription vitamin d 50K units Monday, Wed, Friday.  Rx sent in.   Lithium Lvl  Date Value Ref Range Status  10/21/2018 0.18 (L) 0.60 - 1.20 mmol/L Final    Comment:    Performed at [facility], [address], [place], [place] [address]     No results found for: PHENYTOIN, PHENOBARB, VALPROATE, CBMZ   .res Assessment: Plan:    Bipolar I disorder with rapid cycling ([place])  Generalized anxiety disorder  Attention deficit hyperactivity disorder (ADHD), predominantly inattentive type  Mild cognitive impairment  Tremor of both hands  Tardive dyskinesia  Low vitamin D level  Fusako has chronic rapid cycling bipolar disorder which is chronically unstable and has been difficult to control.  The rapid cycling is making it difficult to control frequency of depressive episodes and the anxiety as well.  We have typically had to make frequent med changes.  We have had to reduce mood stabilizer dosages including Vraylar and Equetro she is very sensitive to carbamazepine because higher doses cause dizziness.     More depressed and cycling with less Vraylar.  No easy solution to this.  She's tried all FDA approved meds for bipolar depression except Caplyta which she can't afford either.  Failed many other meds too. Disc ECT.  She was denie patient assistance with Vraylar. Currently on 1.5 mg daily. Mouth movements tolerable.  Some grinding of teeth during the day.  She is more depressed at the moment but it is likely that the added stress with her husband is a contributing factor and unlikely that any immediate med change is going to help.  She is failed multiple prior meds for depression.  Caplyta is the only reasonable alternative and as mentioned is going to be expensive and probably unobtainable.  Wear a mouthguard bc  of teeth grinding.    Prone to UTI and may be causing confusion discussed.  Had confirmed UTI recently.  Working on neuro eval to [place] PD  Since last year when she was stable she started having hypomanic symptoms again.  We increased Equetro back to 300 mg nightly but she she has had balance problems. It had stopped the manic symptoms but then she started having balance problems again and we had to switch it back to Equetro 300 and add carbamazepine immediate release 100 nightly..  Out of work ADD less of a problem.  Discussed the risk of stimulants including that that could contribute to mood instability and mood swings.    She has a high residual anxiety.  It has been impossible to control all of her symptoms simultaneously without causing side effects.  She agrees.  Continue the following Discussed side effects of each medicine. Continue   Focalin XR 20 mg  To IR 15 mg BID DT Cost and off label for depression  Try to spread this out if possible for mood.  Equetro 200 mg nightly Carbamazepine immediate release 100 mg nightly Lamotrigine 200 mg twice daily Lithium 150 mg nightly Continue Librium 25 HS bc needed for sleep Continue Vraylar to 3 mg every day to  improve recent depressive and manic sx.  It helped mania but not depresion. Disc risk worsening TD and tremor.    If cannot get some meds covered will have marked relapse risks DT multiple other med failures and intolerances.  Discussed potential metabolic side effects associated with atypical antipsychotics, as well as potential risk for movement side effects. Advised pt to contact office if movement side effects occur.  She may be having some mild TD with toe movement and grimacing.  Disc this in detail.  At this point it's tolerable.  . Suspect it.  "I don't realize it". But sometimes others notice  Option reduce Librium but she has no hangover and doesn't want to reduce it. No additonal sedatives during the day which fight the  benefit of the stimulants and be more likely to trigger depression and poor activitty level. We discussed the short-term risks associated with benzodiazepines including sedation and increased fall risk among others.  Discussed long-term side effect risk including dependence, potential withdrawal symptoms, and the potential eventual dose-related risk of dementia.  But recent studies from 2020 dispute this association between benzodiazepines and dementia risk. Newer studies in 2020 do not support an association with dementia.  Disc SE meds and this is heightened by the complication of necessary polypharmacy.  Supportive therapy in terms of dealing with husband's addiction and now new dx metastatic prostate CA.Marland Kitchen  Requires frequent FU DT chronic instability.  Wants to schedule monthly.  FU 8 weeks  Lynder Parents MD, DFAPA  Please see After Visit Summary for patient specific instructions.    Future Appointments  Date Time Provider [place]  05/17/2021  2:45 PM Cottle, Billey Co., MD [facility] None  05/20/2021 10:00 AM Shanon Ace, LCSW [facility] None  05/27/2021 10:00 AM Shanon Ace, LCSW [facility] None  06/08/2021  5:00 PM Shanon Ace, LCSW [facility] None  06/11/2021 10:15 AM Felipa Furnace, DPM [facility]  06/14/2021  1:00 PM Cottle, Billey Co., MD [facility] None  06/17/2021 10:00 AM Shanon Ace, LCSW [facility] None  06/24/2021 10:00 AM Shanon Ace, LCSW [facility] None  07/08/2021 10:00 AM Shanon Ace, LCSW [facility] None  07/12/2021  9:00 AM Cottle, Billey Co., MD [facility] None  08/06/2021 10:40 AM Lindell Spar, MD [facility]  08/09/2021 10:00 AM Cottle, Billey Co., MD [facility] None    No orders of the defined types were placed in this encounter.     -------------------------------

## 2021-04-22 ENCOUNTER — Other Ambulatory Visit: Payer: Self-pay

## 2021-04-22 ENCOUNTER — Ambulatory Visit (INDEPENDENT_AMBULATORY_CARE_PROVIDER_SITE_OTHER): Payer: Medicare Other | Admitting: Psychiatry

## 2021-04-22 DIAGNOSIS — F319 Bipolar disorder, unspecified: Secondary | ICD-10-CM

## 2021-04-22 NOTE — Progress Notes (Signed)
[facility] Counselor/Therapist Progress Note  Patient ID: Megan Whitney, MRN: [id number],    Date: 04/22/2021  Time Spent: 55 minutes  Treatment Type: Individual Therapy  Reported Symptoms: anxiety, depression, marital issues  Mental Status Exam:  Appearance:   Casual     Behavior:  Appropriate, Sharing, and Motivated  Motor:  Normal  Speech/Language:   Clear and Coherent and Normal Rate  Affect:  Anxious, depressed, some tearfulness  Mood:  anxious and depressed  Thought process:  goal directed  Thought content:    Some obsessiveness  Sensory/Perceptual disturbances:    WNL  Orientation:  oriented to person, place, time/date, situation, day of week, month of year, year, and stated date of Aug. 18, 2022  Attention:  Fair  Concentration:  Fair, "sometimes good"  Memory:  WNL  Fund of knowledge:   Good  Insight:    Fair  Judgment:   Fair  Impulse Control:  Fair   Risk Assessment: Danger to Self:  No Self-injurious Behavior: No Danger to Others: No Duty to Warn:no Physical Aggression / Violence:No  Access to Firearms a concern: No  Gang Involvement:No   Subjective:  Patient in today reporting anxiety, depression, some tearfulness, fearful thoughts, stress. Yesterday "was real bad due to our bickering but depression and fears are about the same. States her husband is her main source of stress. Is frustrated that he started back drinking this past week. Fears "being alone" if husband dies, not being able to take care of myself, fears husband's decline. Talked through these concerns more today at length and seemed to feel she is getting stronger in some ways: setting boundaries and limits, getting out more on her own especially when there is tension at home. Worked well in session today on some techniques she and husband will work on to hopefully improve their relationships and communication, and for patient to use in better stress/anxiety management.  Was more calm and  grounded with no tearfulness at end of session.  Getting involved in things within her church and with some other ladies seems to be helping her self-esteem and overall coping which is very challenged these days.  At end of session she mentioned that she feels her tendency to look for negatives and assume that things will go in a bad direction, she feels it is getting better although she has to work at it intentionally.    Interventions: Cognitive Behavioral Therapy and Solution-Oriented/Positive Psychology  Diagnosis:   ICD-10-CM   1. Bipolar I disorder with rapid cycling ([place])  F31.9         Treatment Goal Plan of Care: Patient not signing tx plan on computer screen due to [place]. Treatment Goals: Goals remain on plan as patient works on strategies to meet her goals.  Progress is noted each visit in "Progress" section of goal plan. Long Term Goal: Reduce overall level, frequency, and intensity of the anxiety so that daily functioning is not impaired. Short Term Goal: 1.Increase understanding of the beliefs and messages that produce the worry and anxiety. Strategies: 1.Help client develop reality-based positive cognitive messages/self-talk. 2. Develop a "coping card" or other reminder which coping strategies are recorded for patient's later use .   Plan:  Patient today showing good motivation and engagement in session today.  She and husband have struggled and she was very open about those struggles and the part that both of them play in it.  As noted above we worked on several strategies that  she feels might help the 2 of them in their relationship together.  She was very expressive and active we involved in this task and seemed to gain insight in doing so.  Encouraged patient to continue practicing behaviors that have been helpful previously including: Taking a break from her electronics daily, looking for the positives within herself, focusing on the present and what she can control,  looking for more positives than negatives daily, remaining in touch with supportive people, interrupting her anxious/fearful/depressive thoughts and replacing them with more realistic and encouraging thoughts, remain in contact with her church friends and involved in church groups, stop assuming worst case scenarios, stop self negating, stay involved with her knitting group, get outside daily and walk as she is able, practice positive self talk consistently, maintain healthy boundaries with others as needed, decrease her overthinking and overanalyzing, allow her faith to be a source of emotional support as well as spiritual, and to feel good about the strength she is showing as she works with goal-directed behaviors in the midst of challenging circumstances to move forward in a more positive direction of improved emotional health and stability.  Goal review and progress/challenges noted with patient.  Next appt within 2 weeks.   Shanon Ace, LCSW

## 2021-04-28 ENCOUNTER — Other Ambulatory Visit: Payer: Self-pay | Admitting: Psychiatry

## 2021-04-28 DIAGNOSIS — F319 Bipolar disorder, unspecified: Secondary | ICD-10-CM

## 2021-04-29 ENCOUNTER — Other Ambulatory Visit: Payer: Self-pay

## 2021-04-29 ENCOUNTER — Ambulatory Visit: Payer: Medicare Other | Admitting: Psychiatry

## 2021-04-29 DIAGNOSIS — F319 Bipolar disorder, unspecified: Secondary | ICD-10-CM

## 2021-04-29 NOTE — Progress Notes (Signed)
[facility] Counselor/Therapist Progress Note  Patient ID: Megan Whitney, MRN: [id number],    Date: 04/29/2021  Time Spent: 50 minutes   Treatment Type: Individual Therapy  Reported Symptoms: anxiety, depression, tearful, hopeless at times, stressed  Mental Status Exam:  Appearance:   Casual     Behavior:  Appropriate, Sharing, and Motivated  Motor:  Normal  Speech/Language:   Clear and Coherent  Affect:  Depressed and anxiety  Mood:  anxious and depressed  Thought process:  goal directed  Thought content:    Obsessions  Sensory/Perceptual disturbances:    WNL  Orientation:  oriented to person, place, time/date, situation, day of week, month of year, year, and stated date of Aug. 25, 2022  Attention:  Fair  Concentration:  Fair  Memory:  [place] of knowledge:   Good  Insight:    Good and Fair  Judgment:   Good and Fair  Impulse Control:  Good and Fair   Risk Assessment: Danger to Self:  NO Self-injurious Behavior: No Danger to Others: No Duty to Warn:no Physical Aggression / Violence:No  Access to Firearms a concern: No  Gang Involvement:No   Subjective: Patient in today reporting anxiety, depression, with some intermittent tearfulness and intermittent hopelessness. Denies any thoughts to harm self/others. Upset re: husband having to go to ED for pain, got some oxycodone til he sees his Dr within next few days. Processing anxious/depressive/stressful/fearful thoughts today as "I've had a bad couple days with my crying, depression, stress, and being overwhelmed."  "But today is some better."  Worked more on confronting her negative thoughts, challenging them, and replacing with more encouraging and reality-based thoughts that don't feed her anxiety nor depression. Admits she jumps ahead and assumes worst case scenarios and is trying to interrupt those thoughts earlier.  Also is being more aware of how she speaks to her husband and why he has the negative reaction he  does sometimes.  Patient shared more about this and how she is trying to catch herself more in the moment and be aware of how she speaks to him.  She acknowledges when she is having a worse day, her awareness tends to be lacking.  Husband's drinking has resumed on some occasions which is frustrating for patient and she acknowledges "he has to change that, I cannot".  Reviewed some of the things that helps her cope more effectively including journaling, daily affirmations, Being in touch with friends, participating with other women at the church and activities including exercise and knitting, and enjoying her dogs.  Encouraged to remain on her medications as prescribed.   Interventions: Cognitive Behavioral Therapy, Solution-Oriented/Positive Psychology, and Ego-Supportive  Diagnosis:   ICD-10-CM   1. Bipolar I disorder with rapid cycling ([place])  F31.9       Treatment Goal Plan of Care: Patient not signing tx plan on computer screen due to [place]. Treatment Goals: Goals remain on plan as patient works on strategies to meet her goals.  Progress is noted each visit in "Progress" section of goal plan. Long Term Goal: Reduce overall level, frequency, and intensity of the anxiety so that daily functioning is not impaired. Short Term Goal: 1.Increase understanding of the beliefs and messages that produce the worry and anxiety. Strategies: 1.Help client develop reality-based positive cognitive messages/self-talk. 2. Develop a "coping card" or other reminder which coping strategies are recorded for patient's later use .    Plan:  Patient today showing good motivation and participation in session  today.  She started out a bit slow but became much more motivated as she talked.  It has been a more challenging week recently and husband ended up in ED due to pain issues.  Patient shares that today is a better day for her after having a bad couple of days just previously.  She struggles with a lot of anxiety  and depression and often some negative thoughts, but today once she became more engaged I experienced her sharing more positive thoughts than usual and problem solving thoughts.  Encouraged patient to practice behaviors that have been helpful in the past including: Looking for the positives within herself, taking a break from electronics each day, focusing on the present and what she can control, looking for more positives versus negatives daily, remaining in touch with supportive people, interrupting anxious/fearful/depressive/negative thoughts and challenge them and replace with more realistic and encouraging thoughts, remain in contact with her church friends and involved in church groups, stop self negating, stop assuming worst case scenarios, get outside daily and walk as she is able, practice positive self talk consistently, stay involved in her knitting group, decrease her overthinking over analyzing, maintain healthy boundaries with others as needed, allow her faith to be a source of emotional support as well as spiritual, practice expressing some of her concern in different ways with her husband as we practiced in session today including "how I say what I say" and more active listening to really hear the other person before she responds, and recognize the strength that she is showing as she works with goal-directed behaviors in the midst of challenging circumstances to move forward in a more positive direction of improved emotional health and stability.  Goal review and progress/challenges noted with patient.  Next appointment within 2 weeks.   Shanon Ace, LCSW

## 2021-04-30 ENCOUNTER — Other Ambulatory Visit: Payer: Self-pay | Admitting: Psychiatry

## 2021-04-30 DIAGNOSIS — F314 Bipolar disorder, current episode depressed, severe, without psychotic features: Secondary | ICD-10-CM

## 2021-04-30 NOTE — Telephone Encounter (Signed)
Last seen 8/15.Last filled 03/12/21

## 2021-05-06 ENCOUNTER — Ambulatory Visit: Payer: Medicare Other | Admitting: Psychiatry

## 2021-05-09 ENCOUNTER — Other Ambulatory Visit: Payer: Self-pay

## 2021-05-09 ENCOUNTER — Ambulatory Visit (INDEPENDENT_AMBULATORY_CARE_PROVIDER_SITE_OTHER): Payer: Medicare Other | Admitting: *Deleted

## 2021-05-09 DIAGNOSIS — Z0000 Encounter for general adult medical examination without abnormal findings: Secondary | ICD-10-CM | POA: Diagnosis not present

## 2021-05-09 NOTE — Patient Instructions (Signed)
Megan Whitney , Thank you for taking time to come for your Medicare Wellness Visit. I appreciate your ongoing commitment to your health goals. Please review the following plan we discussed and let me know if I can assist you in the future.   Screening recommendations/referrals: Colonoscopy: Due 09-18-23 Mammogram: Due 06-22-21 Bone Density: Due 04-30-25 Recommended yearly ophthalmology/optometry visit for glaucoma screening and checkup Recommended yearly dental visit for hygiene and checkup  Vaccinations: Influenza vaccine: Due now Pneumococcal vaccine: Second dose due patient advised Tdap vaccine: Due 09-05-28 Shingles vaccine: Completed    Advanced directives: Copy Requested   Next appointment: 1 year    Preventive Care 15 Years and Older, Female Preventive care refers to lifestyle choices and visits with your health care provider that can promote health and wellness. What does preventive care include? A yearly physical exam. This is also called an annual well check. Dental exams once or twice a year. Routine eye exams. Ask your health care provider how often you should have your eyes checked. Personal lifestyle choices, including: Daily care of your teeth and gums. Regular physical activity. Eating a healthy diet. Avoiding tobacco and drug use. Limiting alcohol use. Practicing safe sex. Taking low-dose aspirin every day. Taking vitamin and mineral supplements as recommended by your health care provider. What happens during an annual well check? The services and screenings done by your health care provider during your annual well check will depend on your age, overall health, lifestyle risk factors, and family history of disease. Counseling  Your health care provider may ask you questions about your: Alcohol use. Tobacco use. Drug use. Emotional well-being. Home and relationship well-being. Sexual activity. Eating habits. History of falls. Memory and ability to  understand (cognition). Work and work Statistician. Reproductive health. Screening  You may have the following tests or measurements: Height, weight, and BMI. Blood pressure. Lipid and cholesterol levels. These may be checked every 5 years, or more frequently if you are over 55 years old. Skin check. Lung cancer screening. You may have this screening every year starting at age 46 if you have a 30-pack-year history of smoking and currently smoke or have quit within the past 15 years. Fecal occult blood test (FOBT) of the stool. You may have this test every year starting at age 43. Flexible sigmoidoscopy or colonoscopy. You may have a sigmoidoscopy every 5 years or a colonoscopy every 10 years starting at age 36. Hepatitis C blood test. Hepatitis B blood test. Sexually transmitted disease (STD) testing. Diabetes screening. This is done by checking your blood sugar (glucose) after you have not eaten for a while (fasting). You may have this done every 1-3 years. Bone density scan. This is done to screen for osteoporosis. You may have this done starting at age 37. Mammogram. This may be done every 1-2 years. Talk to your health care provider about how often you should have regular mammograms. Talk with your health care provider about your test results, treatment options, and if necessary, the need for more tests. Vaccines  Your health care provider may recommend certain vaccines, such as: Influenza vaccine. This is recommended every year. Tetanus, diphtheria, and acellular pertussis (Tdap, Td) vaccine. You may need a Td booster every 10 years. Zoster vaccine. You may need this after age 75. Pneumococcal 13-valent conjugate (PCV13) vaccine. One dose is recommended after age 26. Pneumococcal polysaccharide (PPSV23) vaccine. One dose is recommended after age 68. Talk to your health care provider about which screenings and vaccines you need and  how often you need them. This information is not  intended to replace advice given to you by your health care provider. Make sure you discuss any questions you have with your health care provider. Document Released: 09/18/2015 Document Revised: 05/11/2016 Document Reviewed: 06/23/2015 Elsevier Interactive Patient Education  2017 [place] Prevention in the Home Falls can cause injuries. They can happen to people of all ages. There are many things you can do to make your home safe and to help prevent falls. What can I do on the outside of my home? Regularly fix the edges of walkways and driveways and fix any cracks. Remove anything that might make you trip as you walk through a door, such as a raised step or threshold. Trim any bushes or trees on the path to your home. Use bright outdoor lighting. Clear any walking paths of anything that might make someone trip, such as rocks or tools. Regularly check to see if handrails are loose or broken. Make sure that both sides of any steps have handrails. Any raised decks and porches should have guardrails on the edges. Have any leaves, snow, or ice cleared regularly. Use sand or salt on walking paths during winter. Clean up any spills in your garage right away. This includes oil or grease spills. What can I do in the bathroom? Use night lights. Install grab bars by the toilet and in the tub and shower. Do not use towel bars as grab bars. Use non-skid mats or decals in the tub or shower. If you need to sit down in the shower, use a plastic, non-slip stool. Keep the floor dry. Clean up any water that spills on the floor as soon as it happens. Remove soap buildup in the tub or shower regularly. Attach bath mats securely with double-sided non-slip rug tape. Do not have throw rugs and other things on the floor that can make you trip. What can I do in the bedroom? Use night lights. Make sure that you have a light by your bed that is easy to reach. Do not use any sheets or blankets that are  too big for your bed. They should not hang down onto the floor. Have a firm chair that has side arms. You can use this for support while you get dressed. Do not have throw rugs and other things on the floor that can make you trip. What can I do in the kitchen? Clean up any spills right away. Avoid walking on wet floors. Keep items that you use a lot in easy-to-reach places. If you need to reach something above you, use a strong step stool that has a grab bar. Keep electrical cords out of the way. Do not use floor polish or wax that makes floors slippery. If you must use wax, use non-skid floor wax. Do not have throw rugs and other things on the floor that can make you trip. What can I do with my stairs? Do not leave any items on the stairs. Make sure that there are handrails on both sides of the stairs and use them. Fix handrails that are broken or loose. Make sure that handrails are as long as the stairways. Check any carpeting to make sure that it is firmly attached to the stairs. Fix any carpet that is loose or worn. Avoid having throw rugs at the top or bottom of the stairs. If you do have throw rugs, attach them to the floor with carpet tape. Make sure that you have  a light switch at the top of the stairs and the bottom of the stairs. If you do not have them, ask someone to add them for you. What else can I do to help prevent falls? Wear shoes that: Do not have high heels. Have rubber bottoms. Are comfortable and fit you well. Are closed at the toe. Do not wear sandals. If you use a stepladder: Make sure that it is fully opened. Do not climb a closed stepladder. Make sure that both sides of the stepladder are locked into place. Ask someone to hold it for you, if possible. Clearly mark and make sure that you can see: Any grab bars or handrails. First and last steps. Where the edge of each step is. Use tools that help you move around (mobility aids) if they are needed. These  include: Canes. Walkers. Scooters. Crutches. Turn on the lights when you go into a dark area. Replace any light bulbs as soon as they burn out. Set up your furniture so you have a clear path. Avoid moving your furniture around. If any of your floors are uneven, fix them. If there are any pets around you, be aware of where they are. Review your medicines with your doctor. Some medicines can make you feel dizzy. This can increase your chance of falling. Ask your doctor what other things that you can do to help prevent falls. This information is not intended to replace advice given to you by your health care provider. Make sure you discuss any questions you have with your health care provider. Document Released: 06/18/2009 Document Revised: 01/28/2016 Document Reviewed: 09/26/2014 Elsevier Interactive Patient Education  2017 [employer].

## 2021-05-09 NOTE — Progress Notes (Signed)
Subjective:   Megan Whitney is a 65 y.o. female who presents for Medicare Annual (Subsequent) preventive examination.  I connected with  GLORY GRAEFE on 05/09/21 by an audio enabled telemedicine application and verified that I am speaking with the correct person using two identifiers.   I discussed the limitations, risks, security and privacy concerns of performing an evaluation and management service by telephone and the availability of in person appointments. I also discussed with the patient that there may be a patient responsible charge related to this service. The patient expressed understanding and verbally consented to this telephonic visit.   Review of Systems           Objective:    There were no vitals filed for this visit. There is no height or weight on file to calculate BMI.  Advanced Directives 01/19/2020 01/19/2020 10/29/2019 10/21/2018 09/08/2018 09/08/2018 05/14/2012  Does Patient Have a Medical Advance Directive? No No Yes Yes Yes No Patient has advance directive, copy not in chart  Type of Advance Directive - - [place];Living will [place];Living will [place];Living will - [place];Living will  Copy of [place] in Chart? - - - No - copy requested - - Copy requested from family  Would patient like information on creating a medical advance directive? - No - Patient declined - - - - -  Pre-existing out of facility DNR order (yellow form or pink MOST form) - - - - - - No    Current Medications (verified) Outpatient Encounter Medications as of 05/09/2021  Medication Sig   atorvastatin (LIPITOR) 20 MG tablet TAKE (1) TABLET BY MOUTH AT BEDTIME.   carbamazepine (TEGRETOL) 100 MG chewable tablet CHEW 1 TABLET BY MOUTH AT BEDTIME.   cariprazine (VRAYLAR) 3 MG capsule Take 1 capsule (3 mg total) by mouth daily.   celecoxib (CELEBREX) 50 MG capsule Take 1 capsule (50 mg total)  by mouth 2 (two) times daily.   cetirizine (ZYRTEC) 10 MG tablet Take 1 tablet (10 mg total) by mouth daily. (Patient not taking: No sig reported)   chlordiazePOXIDE (LIBRIUM) 25 MG capsule TAKE (1) CAPSULE BY MOUTH AT BEDTIME AS NEEDED FOR ANXIETY   cholecalciferol (VITAMIN D) 25 MCG (1000 UNIT) tablet Take 1,000 Units by mouth daily.   dexmethylphenidate (FOCALIN) 10 MG tablet TAKE 1 AND 1/2 TABLETS BY MOUTH TWICE DAILY.   docusate sodium (COLACE) 100 MG capsule Take 100 mg by mouth. 1 tab every other day. (Patient not taking: No sig reported)   doxycycline (VIBRA-TABS) 100 MG tablet Take 1 tablet (100 mg total) by mouth 2 (two) times daily. (Patient not taking: No sig reported)   EQUETRO 200 MG CP12 12 hr capsule TAKE (1) CAPSULE BY MOUTH AT BEDTIME.   ferrous gluconate (FERGON) 324 MG tablet Take 324 mg by mouth daily with breakfast.   fluticasone (FLONASE) 50 MCG/ACT nasal spray Use 2 sprays in each nostril BID for a week. After 1 week, decrease to 1 spray in each nostril BID as needed for congestion/allergies. (Patient not taking: Reported on 04/19/2021)   lamoTRIgine (LAMICTAL) 200 MG tablet TAKE (1) TABLET BY MOUTH TWICE DAILY.   lithium carbonate 150 MG capsule TAKE (1) CAPSULE BY MOUTH ONCE DAILY IN THE AFTERNOON. (Patient taking differently: 1 tab at bedtime)   Loratadine 10 MG CAPS Take by mouth. (Patient not taking: Reported on 04/19/2021)   nitrofurantoin, macrocrystal-monohydrate, (MACROBID) 100 MG capsule Take  1 capsule (100 mg total) by mouth 2 (two) times daily. (Patient not taking: No sig reported)   pantoprazole (PROTONIX) 40 MG tablet Take 1 tablet (40 mg total) by mouth daily.   No facility-administered encounter medications on file as of 05/09/2021.    Allergies (verified) Azithromycin, Penicillins, and Adhesive [tape]   History: Past Medical History:  Diagnosis Date   ADD (attention deficit disorder)    Allergy    Seasonal   Anemia    History of GI blood loss    Anxiety    Arthritis    Atrophy of vagina 10/07/2020   Bipolar 1 disorder (HCC)    Cancer ([place])    Colon polyps    Depression    Diabetes mellitus ([place])    Edema, lower extremity    Epistaxis    Around 2011 or 2012, required cauterization.    Esophageal stricture    Fracture of superior pubic ramus (HCC) 11/28/2018   GERD (gastroesophageal reflux disease)    Headache(784.0)    Hyperlipidemia    Interstitial cystitis    Joint pain    Lactose intolerance    Lung cancer ([place]) 2002   Obesity    Osteoarthritis    Palpitations    Sleep apnea    Doesn't use a CPAP   Suicidal ideation 01/20/2020   Swallowing difficulty    Past Surgical History:  Procedure Laterality Date   BALLOON DILATION  05/16/2012   Procedure: BALLOON DILATION;  Surgeon: Inda Castle, MD;  Location: [facility];  Service: Endoscopy;  Laterality: N/A;   BUNIONECTOMY  2011   COLONOSCOPY     ENTEROSCOPY  05/16/2012   Procedure: ENTEROSCOPY;  Surgeon: Inda Castle, MD;  Location: [facility];  Service: Endoscopy;  Laterality: N/A;   JOINT REPLACEMENT     right shoulder durgery 71 yrs ago  [place]  2006, 2008   bilateral   TUBAL LIGATION  1990   WEDGE RESECTION  2002   lung cancer   Family History  Problem Relation Age of Onset   Arthritis Mother    Hearing loss Mother    Hyperlipidemia Mother    Hypertension Mother    Depression Mother    Anxiety disorder Mother    Obesity Mother    Sudden death Mother    Hypertension Father    Diabetes Mellitus II Father    Heart disease Father    Arthritis Father    Cancer Father        Brain   COPD Father    Diabetes Father    Hyperlipidemia Father    Sleep apnea Father    Early death Sister        Aneroxia/Bulimic   Depression Brother    Early death Proofreader Accident   Depression Daughter    Drug abuse Daughter    Heart disease Daughter    Hypertension Daughter    Stroke Maternal Grandmother    Hypertension  Maternal Grandmother    Arthritis Maternal Grandfather    Heart attack Maternal Grandfather    Hearing loss Maternal Grandfather    Colon cancer Neg Hx    Esophageal cancer Neg Hx    Rectal cancer Neg Hx    Social History   Socioeconomic History   Marital status: Married    Spouse name: Not on file   Number of children: 1   Years of education: Not on file  Highest education level: Not on file  Occupational History   Occupation: Admin. assistant  Tobacco Use   Smoking status: Never   Smokeless tobacco: Never  Vaping Use   Vaping Use: Never used  Substance and Sexual Activity   Alcohol use: Yes    Alcohol/week: 1.0 standard drink    Types: 1 Glasses of wine per week    Comment: Moderate   Drug use: No   Sexual activity: Yes  Other Topics Concern   Not on file  Social History Narrative   Pt lives in [place] with husband Lanny Hurst.  Followed by Dr. Clovis Pu for psychiatry and Rinaldo Cloud for therapy.   Social Determinants of Health   Financial Resource Strain: Not on file  Food Insecurity: Not on file  Transportation Needs: Not on file  Physical Activity: Not on file  Stress: Not on file  Social Connections: Not on file    Tobacco Counseling Counseling given: Not Answered   Clinical Intake:                 Diabetic?No         Activities of Daily Living In your present state of health, do you have any difficulty performing the following activities: 10/07/2020  Hearing? N  Vision? N  Difficulty concentrating or making decisions? N  Walking or climbing stairs? N  Dressing or bathing? N  Doing errands, shopping? N  Some recent data might be hidden    Patient Care Team: Lindell Spar, MD as PCP - General (Internal Medicine) Irene Shipper, MD as Consulting Physician (Gastroenterology) Cottle, Billey Co., MD as Attending Physician (Psychiatry) Thurnell Lose, MD as Consulting Physician (Obstetrics and Gynecology)  Indicate any recent Medical  Services you may have received from other than [facility] providers in the past year (date may be approximate).     Assessment:   This is a routine wellness examination for [place].  Hearing/Vision screen No results found.  Dietary issues and exercise activities discussed:     Goals Addressed   None   Depression Screen PHQ 2/9 Scores 02/04/2021 12/22/2020 10/07/2020 06/11/2020 03/02/2020 07/10/2019 04/04/2019  PHQ - 2 Score 3 1 6 3 6  - 2  PHQ- 9 Score 18 - 19 5 24  - 3  Exception Documentation - - - - - Medical reason -    Fall Risk Fall Risk  02/04/2021 12/22/2020 10/07/2020  Falls in the past year? 0 0 0  Number falls in past yr: 0 0 0  Injury with Fall? 0 0 0  Risk for fall due to : No Fall Risks No Fall Risks No Fall Risks  Follow up Falls evaluation completed Falls evaluation completed Falls evaluation completed    [place]:  Any stairs in or around the home? No  If so, are there any without handrails? No  Home free of loose throw rugs in walkways, pet beds, electrical cords, etc? Yes  Adequate lighting in your home to reduce risk of falls? Yes   ASSISTIVE DEVICES UTILIZED TO PREVENT FALLS:  Life alert? No  Use of a cane, walker or w/c? No  Grab bars in the bathroom? Yes  Shower chair or bench in shower? No  Elevated toilet seat or a handicapped toilet? Yes   TIMED UP AND GO:  Was the test performed? No .  Length of time to ambulate 10 feet: NA sec.     Cognitive Function:  Immunizations Immunization History  Administered Date(s) Administered   Influenza Split 06/19/2009, 06/01/2010, 05/17/2012, 06/04/2012, 06/30/2014, 07/15/2016   Influenza,inj,Quad PF,6+ Mos 06/24/2018, 05/01/2019, 06/14/2020   Influenza,trivalent, recombinat, inj, PF 06/03/2017   Moderna Sars-Covid-2 Vaccination 11/14/2019, 12/18/2019   Pneumococcal Conjugate-13 06/05/2018   Td 06/29/2000, 07/01/2010   Tdap 09/05/2018   Zoster Recombinat (Shingrix)  06/08/2018, 08/25/2018    TDAP status: Up to date  Flu Vaccine status: Due, Education has been provided regarding the importance of this vaccine. Advised may receive this vaccine at local pharmacy or Health Dept. Aware to provide a copy of the vaccination record if obtained from local pharmacy or Health Dept. Verbalized acceptance and understanding.  Pneumococcal vaccine status: Up to date  Covid-19 vaccine status: Completed vaccines  Qualifies for Shingles Vaccine? Yes   Zostavax completed Yes   Shingrix Completed?: Yes  Screening Tests Health Maintenance  Topic Date Due   COVID-19 Vaccine (3 - Moderna risk series) 01/15/2020   INFLUENZA VACCINE  04/05/2021   PAP SMEAR-Modifier  06/23/2021 (Originally 10/07/2019)   Hepatitis C Screening  12/22/2021 (Originally 09/22/1973)   HIV Screening  12/22/2021 (Originally 09/22/1970)   PNA vac Low Risk Adult (2 of 2 - PPSV23) 12/22/2021 (Originally 09/22/2020)   MAMMOGRAM  06/22/2022   COLONOSCOPY (Pts 45-59yrs Insurance coverage will need to be confirmed)  09/18/2023   TETANUS/TDAP  09/05/2028   DEXA SCAN  Completed   Zoster Vaccines- Shingrix  Completed   HPV VACCINES  Aged Out    Health Maintenance  Health Maintenance Due  Topic Date Due   COVID-19 Vaccine (3 - Moderna risk series) 01/15/2020   INFLUENZA VACCINE  04/05/2021    Colorectal cancer screening: Type of screening: Colonoscopy. Completed 09-17-18. Repeat every 5 years  Mammogram status: Completed 06-22-20. Repeat every year  Bone Density status: Completed 04-30-20. Results reflect: Bone density results: NORMAL. Repeat every 5 years.  Lung Cancer Screening: (Low Dose CT Chest recommended if Age 50-80 years, 30 pack-year currently smoking OR have quit w/in 15years.) does not qualify.   Lung Cancer Screening Referral: NA  Additional Screening:  Hepatitis C Screening: does qualify; Ordered  Vision Screening: Recommended annual ophthalmology exams for early detection of  glaucoma and other disorders of the eye. Is the patient up to date with their annual eye exam?   Who is the provider or what is the name of the office in which the patient attends annual eye exams? [facility] Yes  If pt is not established with a provider, would they like to be referred to a provider to establish care? No .   Dental Screening: Recommended annual dental exams for proper oral hygiene  Community Resource Referral / Chronic Care Management: CRR required this visit?  No   CCM required this visit?  No      Plan:     I have personally reviewed and noted the following in the patient's chart:   Medical and social history Use of alcohol, tobacco or illicit drugs  Current medications and supplements including opioid prescriptions.  Functional ability and status Nutritional status Physical activity Advanced directives List of other physicians Hospitalizations, surgeries, and ER visits in previous 12 months Vitals Screenings to include cognitive, depression, and falls Referrals and appointments  In addition, I have reviewed and discussed with patient certain preventive protocols, quality metrics, and best practice recommendations. A written personalized care plan for preventive services as well as general preventive health recommendations were provided to patient.     Edwena Blow  Iona Coach, CMA   05/09/2021   Nurse Notes: This was a telehealth visit. The patient was at home. The provider was at home and was Ihor Dow, MD.

## 2021-05-11 ENCOUNTER — Other Ambulatory Visit: Payer: Self-pay | Admitting: Psychiatry

## 2021-05-11 DIAGNOSIS — F319 Bipolar disorder, unspecified: Secondary | ICD-10-CM

## 2021-05-11 NOTE — Telephone Encounter (Signed)
Can you clarify dosing schedule

## 2021-05-13 ENCOUNTER — Other Ambulatory Visit: Payer: Self-pay

## 2021-05-13 ENCOUNTER — Ambulatory Visit: Payer: Medicare Other | Admitting: Psychiatry

## 2021-05-13 DIAGNOSIS — F319 Bipolar disorder, unspecified: Secondary | ICD-10-CM | POA: Diagnosis not present
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
## 2021-05-14 ENCOUNTER — Other Ambulatory Visit: Payer: Self-pay | Admitting: Psychiatry

## 2021-05-14 DIAGNOSIS — F319 Bipolar disorder, unspecified: Secondary | ICD-10-CM

## 2021-05-17 ENCOUNTER — Telehealth (INDEPENDENT_AMBULATORY_CARE_PROVIDER_SITE_OTHER): Payer: Medicare Other | Admitting: Psychiatry

## 2021-05-17 ENCOUNTER — Encounter: Payer: Self-pay | Admitting: Psychiatry

## 2021-05-17 DIAGNOSIS — F411 Generalized anxiety disorder: Secondary | ICD-10-CM

## 2021-05-17 DIAGNOSIS — F319 Bipolar disorder, unspecified: Secondary | ICD-10-CM | POA: Diagnosis not present

## 2021-05-17 DIAGNOSIS — G2401 Drug induced subacute dyskinesia: Secondary | ICD-10-CM

## 2021-05-17 DIAGNOSIS — R251 Tremor, unspecified: Secondary | ICD-10-CM

## 2021-05-17 DIAGNOSIS — F900 Attention-deficit hyperactivity disorder, predominantly inattentive type: Secondary | ICD-10-CM

## 2021-05-17 DIAGNOSIS — R296 Repeated falls: Secondary | ICD-10-CM

## 2021-05-17 DIAGNOSIS — G3184 Mild cognitive impairment, so stated: Secondary | ICD-10-CM | POA: Diagnosis not present

## 2021-05-17 DIAGNOSIS — R7989 Other specified abnormal findings of blood chemistry: Secondary | ICD-10-CM

## 2021-05-17 MED ORDER — CARBAMAZEPINE ER 300 MG PO CP12: 300.0000 mg | ORAL_CAPSULE | Freq: Every day | ORAL | 0 refills | Status: DC

## 2021-05-17 NOTE — Progress Notes (Addendum)
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 ramus (HCC) 11/28/2018   GERD (gastroesophageal reflux disease)    Headache(784.0)    Hyperlipidemia    Interstitial cystitis    Joint pain    Lactose intolerance    Lung cancer ([place]) 2002   Obesity    Osteoarthritis    Palpitations    Sleep apnea    Doesn't use a CPAP   Suicidal ideation 01/20/2020   Swallowing difficulty     Family History  Problem Relation Age of Onset   Arthritis Mother    Hearing loss Mother    Hyperlipidemia Mother    Hypertension Mother    Depression Mother    Anxiety disorder Mother    Obesity Mother    Sudden death Mother    Hypertension Father    Diabetes Mellitus II Father    Heart disease Father    Arthritis Father    Cancer Father        Brain   COPD Father    Diabetes Father    Hyperlipidemia Father    Sleep apnea Father    Early death Sister        Aneroxia/Bulimic   Depression Brother    Early death Proofreader Accident   Depression Daughter    Drug abuse Daughter    Heart disease Daughter    Hypertension Daughter    Stroke Maternal Grandmother    Hypertension Maternal Grandmother     Arthritis Maternal Grandfather    Heart attack Maternal Grandfather    Hearing loss Maternal Grandfather    Colon cancer Neg Hx    Esophageal cancer Neg Hx    Rectal cancer Neg Hx     Social History   Socioeconomic History   Marital status: Married    Spouse name: Not on file   Number of children: 1   Years of education: Not on file   Highest education level: Not on file  Occupational History   Occupation: Admin. assistant  Tobacco Use   Smoking status: Never   Smokeless tobacco: Never  Vaping Use   Vaping Use: Never used  Substance and Sexual Activity   Alcohol use: Yes    Alcohol/week: 1.0 standard drink    Types: 1 Glasses of wine per week    Comment: Moderate  Drug use: No   Sexual activity: Yes  Other Topics Concern   Not on file  Social History Narrative   Pt lives in [place] with husband Lanny Hurst.  Followed by Dr. Clovis Pu for psychiatry and Rinaldo Cloud for therapy.   Social Determinants of Health   Financial Resource Strain: Medium Risk   Difficulty of Paying Living Expenses: Somewhat hard  Food Insecurity: No Food Insecurity   Worried About Charity fundraiser in the Last Year: Never true   Ran Out of Food in the Last Year: Never true  Transportation Needs: No Transportation Needs   Lack of Transportation (Medical): No   Lack of Transportation (Non-Medical): No  Physical Activity: Inactive   Days of Exercise per Week: 0 days   Minutes of Exercise per Session: 0 min  Stress: No Stress Concern Present   Feeling of Stress : Only a little  Social Connections: Moderately Integrated   Frequency of Communication with Friends and Family: More than three times a week   Frequency of Social Gatherings with Friends and Family: More than three times a week   Attends Religious Services: More than 4 times per year   Active Member of [employer] or Organizations: No   Attends Archivist Meetings: Never   Marital Status: Married  Human resources officer Violence: Not  At Risk   Fear of Current or Ex-Partner: No   Emotionally Abused: No   Physically Abused: No   Sexually Abused: No    Past Medical History, Surgical history, Social history, and Family history were reviewed and updated as appropriate.   Please see review of systems for further details on the patient's review from today.   Objective:   Physical Exam:  There were no vitals taken for this visit.  Physical Exam Constitutional:      General: She is not in acute distress. Musculoskeletal:        General: No deformity.  Neurological:     Mental Status: She is alert and oriented to person, place, and time.     Cranial Nerves: No dysarthria.     Motor: Tremor present.     Coordination: Coordination normal.     Comments: Mild resting R hand rotational tremor Fidgety mildy with feet Very mild lip pursing.  Psychiatric:        Attention and Perception: Attention and perception normal. She does not perceive auditory or visual hallucinations.        Mood and Affect: Mood is anxious. Mood is not depressed. Affect is not labile, blunt, angry or inappropriate.        Speech: Speech normal.        Behavior: Behavior normal. Behavior is cooperative.        Thought Content: Thought content normal. Thought content is not paranoid or delusional. Thought content does not include homicidal or suicidal ideation. Thought content does not include homicidal or suicidal plan.        Cognition and Memory: Cognition and memory normal.        Judgment: Judgment normal.     Comments: Insight intact Less depressed but cycling into hypomania.     Lab Review:     Component Value Date/Time   NA 139 01/28/2021 0926   K 4.6 01/28/2021 0926   CL 104 01/28/2021 0926   CO2 20 01/28/2021 0926   GLUCOSE 91 01/28/2021 0926   GLUCOSE 102 (H) 01/19/2020 1158   BUN 28 (H) 01/28/2021 0926   CREATININE 0.87 01/28/2021 0926  CALCIUM 9.1 01/28/2021 0926   PROT 6.4 01/28/2021 0926   ALBUMIN 4.0 01/28/2021 0926    AST 16 01/28/2021 0926   ALT 13 01/28/2021 0926   ALKPHOS 64 01/28/2021 0926   BILITOT 0.2 01/28/2021 0926   GFRNONAA 83 03/02/2020 1433   GFRAA 96 03/02/2020 1433       Component Value Date/Time   WBC 8.4 01/28/2021 0926   WBC 7.4 01/19/2020 1158   RBC 4.47 01/28/2021 0926   RBC 4.19 01/19/2020 1158   HGB 13.2 01/28/2021 0926   HCT 40.4 01/28/2021 0926   PLT 231 01/28/2021 0926   MCV 90 01/28/2021 0926   MCH 29.5 01/28/2021 0926   MCH 30.3 01/19/2020 1158   MCHC 32.7 01/28/2021 0926   MCHC 32.1 01/19/2020 1158   RDW 13.0 01/28/2021 0926   LYMPHSABS 3.5 (H) 01/28/2021 0926   MONOABS 0.7 04/04/2019 1004   EOSABS 0.3 01/28/2021 0926   BASOSABS 0.1 01/28/2021 0926  Vitamin D level 33 on 10K units daily on 12/4/`9 Increased to prescription vitamin d 50K units Monday, Wed, Friday.  Rx sent in.   Lithium Lvl  Date Value Ref Range Status  10/21/2018 0.18 (L) 0.60 - 1.20 mmol/L Final    Comment:    Performed at [facility], [address], [place], [place] [address]     No results found for: PHENYTOIN, PHENOBARB, VALPROATE, CBMZ   .res Assessment: Plan:    Bipolar I disorder with rapid cycling ([place]) - Plan: Carbamazepine (EQUETRO) 300 MG CP12  Generalized anxiety disorder  Attention deficit hyperactivity disorder (ADHD), predominantly inattentive type  Mild cognitive impairment  Tremor of both hands  Tardive dyskinesia  Low vitamin D level  Falling  [place] has chronic rapid cycling bipolar disorder which is chronically unstable and has been difficult to control.  The rapid cycling is making it difficult to control frequency of depressive episodes and the anxiety as well.  We have typically had to make frequent med changes.  We have had to reduce mood stabilizer dosages including Vraylar and Equetro she is very sensitive to carbamazepine because higher doses cause dizziness.     More cycling with less Vraylar.  No easy solution to this.  She's tried all FDA approved  meds for bipolar depression except Caplyta which she can't afford either.  Failed many other meds too. Since the last appointment she cycled from depression into mania. Disc ECT.  She was denie patient assistance with Vraylar. Currently on 1.5 mg daily. Mouth movements tolerable.  Some grinding of teeth during the day.  She is more depressed at the moment but it is likely that the added stress with her husband is a contributing factor and unlikely that any immediate med change is going to help.  She is failed multiple prior meds for depression.  Caplyta is the only reasonable alternative and as mentioned is going to be expensive and probably unobtainable.  Wear a mouthguard bc of teeth grinding.    Prone to UTI and may be causing confusion discussed.  Had confirmed UTI recently.  Working on neuro eval to [place] PD  Since last year when she was stable she started having hypomanic symptoms again.  We increased Equetro back to 300 mg nightly but she she has had balance problems. It had stopped the manic symptoms but then she started having balance problems again and we had to switch it back to Equetro 200 and add carbamazepine immediate release 100 nightly.Marland Kitchen  However now need to retry  Equetro 300 mg HS DT hypomania.  Disc fall risk and be careful.  Hypomania , manic sx are serious bc caused dangerous driving.  Out of work ADD less of a problem.  Discussed the risk of stimulants including that that could contribute to mood instability and mood swings.    She has a high residual anxiety.  It has been impossible to control all of her symptoms simultaneously without causing side effects.  She agrees.  Continue the following Discussed side effects of each medicine. Continue   Focalin XR 20 mg  To IR 15 mg BID DT Cost and off label for depression  Try to spread this out if possible for mood.  Increase Equetro 300 mg nightly Carbamazepine immediate release 100 mg nightly Lamotrigine 200 mg twice  daily Lithium 150 mg nightly Continue Librium 25 HS bc needed for sleep Continue Vraylar to 3 mg every day to improve recent depressive and manic sx.  It helped mania but not depresion. Disc risk worsening TD and tremor.    If cannot get some meds covered will have marked relapse risks DT multiple other med failures and intolerances.  Discussed potential metabolic side effects associated with atypical antipsychotics, as well as potential risk for movement side effects. Advised pt to contact office if movement side effects occur.  She may be having some mild TD with toe movement and grimacing.  Disc this in detail.  At this point it's tolerable.  . Suspect it.  "I don't realize it". But sometimes others notice  Option reduce Librium but she has no hangover and doesn't want to reduce it. No additonal sedatives during the day which fight the benefit of the stimulants and be more likely to trigger depression and poor activitty level. We discussed the short-term risks associated with benzodiazepines including sedation and increased fall risk among others.  Discussed long-term side effect risk including dependence, potential withdrawal symptoms, and the potential eventual dose-related risk of dementia.  But recent studies from 2020 dispute this association between benzodiazepines and dementia risk. Newer studies in 2020 do not support an association with dementia.  Disc SE meds and this is heightened by the complication of necessary polypharmacy.  Supportive therapy in terms of dealing with husband's addiction and now new dx metastatic prostate CA.Marland Kitchen  Requires frequent FU DT chronic instability.  Wants to schedule monthly.  FU 4 weeks  Lynder Parents MD, DFAPA  Please see After Visit Summary for patient specific instructions.    Future Appointments  Date Time Provider [place]  05/20/2021 10:00 AM Shanon Ace, LCSW [facility] None  05/27/2021 10:00 AM Shanon Ace, LCSW [facility] None   06/08/2021  5:00 PM Shanon Ace, LCSW [facility] None  06/11/2021 10:15 AM Felipa Furnace, DPM [facility]  06/14/2021  1:00 PM Cottle, Billey Co., MD [facility] None  06/17/2021 10:00 AM Shanon Ace, LCSW [facility] None  06/24/2021 10:00 AM Shanon Ace, LCSW [facility] None  07/08/2021 10:00 AM Shanon Ace, LCSW [facility] None  07/12/2021  9:00 AM Cottle, Billey Co., MD [facility] None  08/06/2021 10:40 AM Lindell Spar, MD [facility]  08/09/2021 10:00 AM Cottle, Billey Co., MD [facility] None    No orders of the defined types were placed in this encounter.     -------------------------------

## 2021-05-20 ENCOUNTER — Ambulatory Visit: Payer: Medicare Other | Admitting: Psychiatry

## 2021-05-20 ENCOUNTER — Other Ambulatory Visit: Payer: Self-pay

## 2021-05-20 DIAGNOSIS — F319 Bipolar disorder, unspecified: Secondary | ICD-10-CM

## 2021-05-20 NOTE — Progress Notes (Signed)
[facility] Counselor/Therapist Progress Note  Patient ID: Megan Whitney, MRN: [id number],    Date: 05/20/2021  Time Spent: 50 minutes   Treatment Type: Individual Therapy  Reported Symptoms: anxiety, depression some worse again, hypomanic "at times", irritability, racing thoughts, increased eating   Mental Status Exam:  Appearance:   Neat     Behavior:  Appropriate, Sharing, and Motivated  Motor:  Normal  Speech/Language:   Clear and Coherent  Affect:  Anxious, depressed  Mood:  anxious and depressed  Thought process:  Racing thoughts  Thought content:    Some obsessiveness  Sensory/Perceptual disturbances:    WNL  Orientation:  oriented to person, place, time/date, situation, day of week, month of year, year, and stated date of Sept. 15, 2022  Attention:  Fair  Concentration:  Good and Fair  Memory:  Reports some short term memory issues and letting PCP know  Fund of knowledge:   Fair  Insight:    Fair  Judgment:   Good  Impulse Control:  Fair   Risk Assessment: Danger to Self:  No Self-injurious Behavior: No Danger to Others: No Duty to Warn:no Physical Aggression / Violence:No  Access to Firearms a concern: No  Gang Involvement:No   Subjective:  Patient in today reporting anxiety, some increase in depression, denies any current SI, irritability, eating in excess, sleeping more, racing thoughts, hypomanic. No further poor judgement while driving. Took off from 2 groups this week but plans to go back next week. Went to her first session of new Bible study this week. Realizing "I need to stay involved with people." Processed her feelings today about her own recent instability emotionally, saying "I can't do this, I cant do that, I'm afraid of this, I'm anxious about that. Talked about how she doesn't want to be limited by that attitude. Patient commits to some writing ("not journaling) as she feels that would help.)  Is calm during session today. To see  grand-daughter again tomorrow and looking forward to that. Staying in contact with a few friends that is helpful and feels it helps her to come and talk through her stressors and strategies.  Trying to catch herself when she "over worries" about things now or in the future, and tries to stay focused on the present.  Reviewed anxiety management skills again along with communication skills, and ways to interrupt anxious/worrisome thoughts and redirect herself.  Looks calmer today, neatly dressed, good eye contact and not fidgety, does have the tremors in her hands with her right hand more obvious today.  States she and her husband will be getting out this afternoon for him to get his back injection and then will have time with her granddaughter afterwards which she is looking forward to.  Today, feels she is doing better not reacting in the moment but adds that it has been a day that has not been very stressful.  Interventions: Solution-Oriented/Positive Psychology, Ego-Supportive, and Insight-Oriented  Diagnosis:   ICD-10-CM   1. Bipolar I disorder with rapid cycling ([place])  F31.9        Treatment Goal Plan of Care: Patient not signing tx plan on computer screen due to [place]. Treatment Goals: Goals remain on plan as patient works on strategies to meet her goals.  Progress is noted each visit in "Progress" section of goal plan. Long Term Goal: Reduce overall level, frequency, and intensity of the anxiety so that daily functioning is not impaired. Short Term Goal: 1.Increase understanding of the  beliefs and messages that produce the worry and anxiety. Strategies: 1.Help client develop reality-based positive cognitive messages/self-talk. 2. Develop a "coping card" or other reminder which coping strategies are recorded for patient's later use.   Plan:  Patient today showing good motivation and participation in session today as she worked on issues regarding her anxiety, depression, staying more in  the present, staying connected to friends and extended family, getting out of the house a little more, and trying to make some changes to have better self-care as noted above.  Encouraged patient to practice positive behaviors that have helped her previously including: Increasing her patience when things do not go as planned, taking a break from electronics each day, searching for the positives within herself, focusing on the present and what she can control, looking for more positives than negatives each day, staying in touch with supportive people, interrupting anxious/fearful/depressive/negative thoughts and challenge them and replace with more realistic and encouraging thoughts, remain in contact with her church friends, stay involved in church groups that she enjoys, stop self negating, get outside daily and walk as she is able, stop assuming worst case scenarios, use consistent positive self talk, stay involved with her knitting and exercise groups, decrease overthinking and overanalyzing, set and keep healthy boundaries with others, allow her faith to be a source of emotional support as well as spiritual, pay attention to "how I say what I say", use active listening skills as discussed in session especially with husband, and recognize the strength that she is showing as she works with goal-directed behaviors to move towards improved emotional health and stability.  Goal review and progress/challenges noted with patient.  Next appointment within 2 weeks.   Shanon Ace, LCSW

## 2021-05-24 DIAGNOSIS — H5203 Hypermetropia, bilateral: Secondary | ICD-10-CM | POA: Diagnosis not present

## 2021-05-27 ENCOUNTER — Ambulatory Visit (INDEPENDENT_AMBULATORY_CARE_PROVIDER_SITE_OTHER): Payer: Medicare Other | Admitting: Psychiatry

## 2021-05-27 DIAGNOSIS — F319 Bipolar disorder, unspecified: Secondary | ICD-10-CM

## 2021-05-27 NOTE — Progress Notes (Signed)
[facility] Counselor/Therapist Progress Note  Patient ID: Megan Whitney, MRN: [id number],    Date: 05/27/2021  Time Spent: 55 minutes   Virtual Visit via MyChart Video Note:  VIDEO session Connected with patient by a video enabled telemedicine/telehealth application or telephone, with their informed consent, and verified patient privacy and that I am speaking with the correct person using two identifiers. I discussed the limitations, risks, security and privacy concerns of performing psychotherapy and management service by telephone and the availability of in person appointments. I also discussed with the patient that there may be a patient responsible charge related to this service. The patient expressed understanding and agreed to proceed. I discussed the treatment planning with the patient. The patient was provided an opportunity to ask questions and all were answered. The patient agreed with the plan and demonstrated an understanding of the instructions. The patient was advised to call  our office if  symptoms worsen or feel they are in a crisis state and need immediate contact.   Therapist Location: [facility] Patient Location: home   Treatment Type: Individual Therapy  Reported Symptoms: anxiety, stressed, depression; States anxiety is the stronger symptom more recently and today  Mental Status Exam:  Appearance:   Neat     Behavior:  Appropriate, Sharing, and Motivated  Motor:  Normal  Speech/Language:   Clear and Coherent and Normal Rate  Affect:  Depressed and anxious  Mood:  anxious and depressed  Thought process:  goal directed  Thought content:    Some obsessiveness and overthinking  Sensory/Perceptual disturbances:    WNL  Orientation:  oriented to person, place, time/date, situation, day of week, month of year, year, and stated date of Sept 22, 2022  Attention:  Good  Concentration:  Good and Fair  Memory:  "Sometimes forgetting things like what I  read and not focusing on it."  [employer] of knowledge:   Good  Insight:    Good and Fair  Judgment:   Good and Fair  Impulse Control:  Fair   Risk Assessment: Danger to Self:  No Self-injurious Behavior: No Danger to Others: No Duty to Warn:no Physical Aggression / Violence:No  Access to Firearms a concern: No  Gang Involvement:No   Subjective: Patient today reporting anxiety, stressed, and depression.  States anxiety is her stronger symptom most recently. Is doing telehealth via myChart due to a Covid-exposure. Very much assuming the worst "fearing Lanny Hurst and I may get Covid and he'd end up in hospital and I couldn't go see him and he might die and etc etc." Worked on her not assuming "what's going wrong  versus right" and trying to reduce her excessive worrying. "Not as stressed the more I talk things out, not as irritable." Sleep is better in some "but still somewhat sporadic". No more incidents of dangerous impulsivity while driving or otherwise. Racing thoughts are much better. Has been involved in some group activities including new Bible Study, knitting group is tentative right now but hoping it will continue, and exercise group at her church. Reflects on some of her progress most recently in putting forth more effort to not assume the worst and look more at "what I can do versus cannot do".  Worked well in session today and recognizing some ways she might can help her self through times when she is having more of the racing thoughts and mood shifts.  Is good about staying in contact with family and friends that are supportive and  also better in her trying to stay more focused in the present and what she can actually change or control versus cannot.  Patient states that she took some good notes during session today as she finds that helpful and reflecting on them later and having some positive reinforcement after sessions.  Interventions: Solution-Oriented/Positive Psychology, Ego-Supportive, and  Insight-Oriented  Diagnosis:   ICD-10-CM   1. Bipolar I disorder with rapid cycling ([place])  F31.9      Treatment Goal Plan of Care: Patient not signing tx plan on computer screen due to [place]. Treatment Goals: Goals remain on plan as patient works on strategies to meet her goals.  Progress is noted each visit in "Progress" section of goal plan. Long Term Goal: Reduce overall level, frequency, and intensity of the anxiety so that daily functioning is not impaired. Short Term Goal: 1.Increase understanding of the beliefs and messages that produce the worry and anxiety. Strategies: 1.Help client develop reality-based positive cognitive messages/self-talk. 2. Develop a "coping card" or other reminder which coping strategies are recorded for patient's later use.   Plan: Patient today showing moderate motivation initially but as session continued her motivation definitely improved and she was very actively engaged in session.  I noticed she was taking notes off and on during the session and she shares that she has found that to be helpful as having some positive reinforcement at home outside of the therapy office, and also helping her reflect on more positives and improved coping skills as she tries to break the habit of "always assuming the worst case scenarios". Encouraged patient to practice positive behaviors that have been helpful in the past including: Searching for the positives within herself, taking a break from her electronics during the day at times, increasing her patience when things do not go as planned, focusing on the present and what she can control or change, intentionally looking for more positives than negatives daily, staying in touch with supportive people, interrupting anxious/fearful/depressive/negative thoughts and challenge and replace them with more realistic and encouraging thoughts, remain in contact with her church friends, stay involved in church groups that she enjoys,  get outside daily and walk as she is able stop self negating, staying involved with her knitting and exercise groups, stop assuming worst case scenarios, consistent positive self talk, decrease overthinking and overanalyzing, set and keep appropriate boundaries with others, allow her faith to be a source of emotional support as well as spiritual, pay attention to "how I say what I say", use active listening skills as discussed in session especially with her husband, and realize the strength that she is showing as she works with goal-directed behaviors to move towards improved emotional health and stability.  Goal review and progress/challenges noted with patient.  Next appointment within 2 weeks.   Shanon Ace, LCSW

## 2021-05-28 ENCOUNTER — Other Ambulatory Visit: Payer: Self-pay | Admitting: Psychiatry

## 2021-06-03 ENCOUNTER — Telehealth: Payer: Self-pay | Admitting: Psychiatry

## 2021-06-03 NOTE — Telephone Encounter (Signed)
Yes Vivien Rota samples are okay for Collie Siad.

## 2021-06-03 NOTE — Telephone Encounter (Signed)
Samples ok?

## 2021-06-03 NOTE — Telephone Encounter (Signed)
Pt called requesting samples for Vraylar 3 mg. Would like to pick up 10/4, has Apt w/ DD.

## 2021-06-07 ENCOUNTER — Other Ambulatory Visit: Payer: Self-pay | Admitting: Psychiatry

## 2021-06-07 DIAGNOSIS — F314 Bipolar disorder, current episode depressed, severe, without psychotic features: Secondary | ICD-10-CM

## 2021-06-08 ENCOUNTER — Ambulatory Visit (INDEPENDENT_AMBULATORY_CARE_PROVIDER_SITE_OTHER): Payer: Medicare Other | Admitting: Psychiatry

## 2021-06-08 DIAGNOSIS — F319 Bipolar disorder, unspecified: Secondary | ICD-10-CM | POA: Diagnosis not present

## 2021-06-08 NOTE — Progress Notes (Signed)
[facility] Counselor/Therapist Progress Note  Patient ID: Megan Whitney, MRN: [id number],    Date: 06/08/2021  Time Spent: 58 minutes   Virtual Visit via MyChart VIDEO Note:  VIDEO session Connected with patient by a telemedicine/telehealth application, with their informed consent, and verified patient privacy and that I am speaking with the correct person using two identifiers. I discussed the limitations, risks, security and privacy concerns of performing psychotherapy and the availability of in person appointments. I also discussed with the patient that there may be a patient responsible charge related to this service. The patient expressed understanding and agreed to proceed. I discussed the treatment planning with the patient. The patient was provided an opportunity to ask questions and all were answered. The patient agreed with the plan and demonstrated an understanding of the instructions. The patient was advised to call  our office if  symptoms worsen or feel they are in a crisis state and need immediate contact.   Therapist Location: [facility] Patient Location: home   Treatment Type: Individual Therapy  Reported Symptoms: depression, anger, some aggression verbally towards husband, pulling away from others at times, waking up some during the night and has off and on, "no feelings of mania"  Mental Status Exam:  Appearance:   Casual     Behavior:  Appropriate, Sharing, and some motivation  Motor:  Normal  Speech/Language:   Clear and Coherent  Affect:  Flat  Mood:  depressed and some anxiety  Thought process:  goal directed  Thought content:    overthinking  Sensory/Perceptual disturbances:    WNL  Orientation:  oriented to person, place, time/date, situation, day of week, month of year, year, and stated date of Oct. 4, 2022  Attention:  Good  Concentration:  Good and Fair  Memory:  WNL  Fund of knowledge:   Good  Insight:    Good and Fair  Judgment:    Fair  Impulse Control:  Fair   Risk Assessment: Danger to Self:  No Self-injurious Behavior: No Danger to Others: No Duty to Warn:no Physical Aggression / Violence:No  Access to Firearms a concern: No  Gang Involvement:No   Subjective: Patient today reporting depression, anxiety, and some verbal aggression with husband, sometimes pulling away from others, waking up some during the nights "sometimes off and on", No racing thoughts nor indication of any mania. The recent storm "didn't help my anxiety and depression" but did rebound and see that as a positive. Talked through more thoughts/feelings about her husband's cancer.  His next appt with [place] Dr is tomorrow to get an update. "I'm catching my negative/assuming the worst thoughts" and encourage patient to try to delete the negative/assuming thoughts and stay in neutral til they get updated info on husband's status at this point. No impulsivity except verbally when frustrated. Is upsetting some to patient that her knitting group may be disbanding soon (unsure) and this is an activity that has been an anchor for patient over the past 3-4 yrs and is hoping maybe they'll just take a break and re-convene.  Continues to keep good contact with family and friends that are supportive.  Took notes again in session today and finds it helpful to have those at home with her as she works on follow through and reflecting on goal-directed behaviors.   Interventions: Solution-Oriented/Positive Psychology, Ego-Supportive, and Insight-Oriented  Diagnosis:   ICD-10-CM   1. Bipolar I disorder with rapid cycling ([place])  F31.9  Treatment Goal Plan of Care: Patient not signing tx plan on computer screen due to [place]. Treatment Goals: Goals remain on plan as patient works on strategies to meet her goals.  Progress is noted each visit in "Progress" section of goal plan. Long Term Goal: Reduce overall level, frequency, and intensity of the anxiety so that  daily functioning is not impaired. Short Term Goal: 1.Increase understanding of the beliefs and messages that produce the worry and anxiety. Strategies: 1.Help client develop reality-based positive cognitive messages/self-talk. 2. Develop a "coping card" or other reminder which coping strategies are recorded for patient's later use.    Plan: Patient today showing good motivation and participation in session today as we worked with her depression, anxiety, fears, some verbal aggression with husband, and worrying as she jumps ahead and assumes worst case scenarios.  Patient worked very well in session today as noted above on these concerns and denies any racing thoughts, no thoughts of self-harm, nor any indication of mania.  She reports that she has had some increase in depression very gradually and mostly related to situation with husband's illness and the "ups and downs" that they go through in this process.  Also, as noted above she is concerned about one of her "support systems" a knitting group that has been a real anchor for her through the last several years may be ending their group, but she is not sure of that at this point.  She agrees to stay more in the present and not make assumptions but also noted that there are 2 or 3 others in there that would not want the group to end so if the group as a whole decide not to keep meeting, may be patient and the other 2 or 3 ladies could decide to continue a small group which seemed to be a viable option for patient.  Encouraged patient to practice positive behaviors that have been helpful to her in the past including: Taking a break from her electronics during the day at times, increasing her patience when things do not go as planned, focusing on the present and what she can control or change, searching for the positives within herself, intentionally looking for more positives than negatives daily, staying in touch with supportive people, interrupting  anxious/fearful/depressive/negative thoughts and challenge and replace them with more realistic and encouraging thoughts, remain in contact with her church friends, stay involved in church groups that she enjoys, get outside daily and walk as she is able, stop self negating, stay involved with her exercise group and knitting group, stop assuming worst case scenarios, consistent positive self talk, decrease overthinking and over analyzing, set and keep appropriate boundaries with others, allow her faith to be a source of emotional support as well as spiritual, use active listening skills as discussed in session especially with her husband, pay attention to "how I say what I say", and feel good about the strength she shows as she works with goal-directed behaviors to move in a direction that supports improved emotional health and wellbeing.  Goal review and progress/challenges noted with patient.  Next appointment within 2 weeks.  This record has been created using [employer].  Chart creation errors have been sought, but may not always have been located and corrected.  Such creation errors do not reflect on the standard of medical care provided.    Shanon Ace, LCSW

## 2021-06-11 ENCOUNTER — Ambulatory Visit: Payer: Medicare Other | Admitting: Podiatry

## 2021-06-11 ENCOUNTER — Encounter: Payer: Self-pay | Admitting: Podiatry

## 2021-06-11 ENCOUNTER — Other Ambulatory Visit: Payer: Self-pay

## 2021-06-11 ENCOUNTER — Other Ambulatory Visit: Payer: Self-pay | Admitting: Psychiatry

## 2021-06-11 DIAGNOSIS — M949 Disorder of cartilage, unspecified: Secondary | ICD-10-CM

## 2021-06-11 DIAGNOSIS — F319 Bipolar disorder, unspecified: Secondary | ICD-10-CM

## 2021-06-11 DIAGNOSIS — M7752 Other enthesopathy of left foot: Secondary | ICD-10-CM

## 2021-06-11 DIAGNOSIS — M899 Disorder of bone, unspecified: Secondary | ICD-10-CM

## 2021-06-11 NOTE — Progress Notes (Signed)
Subjective:  Patient ID: Megan Whitney, female    DOB: 03/13/1956,  MRN: [id number]  Chief Complaint  Patient presents with   Follow-up    Recurring pain in ankle- mentioned when exercising or walking long distance flare up occurs- injection helps relief her pain     65 y.o. female presents with the above complaint.  Patient presents with a complaint of left chronic ankle arthritis with underlying OCD lesion.  Patient states that she started hurting few weeks ago likely due to weather changes.  She wanted get evaluated and get another injection she was doing good prior to that.  She denies any other acute complaints   Review of Systems: Negative except as noted in the HPI. Denies N/V/F/Ch.  Past Medical History:  Diagnosis Date   ADD (attention deficit disorder)    Allergy    Seasonal   Anemia    History of GI blood loss   Anxiety    Arthritis    Atrophy of vagina 10/07/2020   Bipolar 1 disorder (HCC)    Cancer (HCC)    Colon polyps    Depression    Diabetes mellitus ([place])    Edema, lower extremity    Epistaxis    Around 2011 or 2012, required cauterization.    Esophageal stricture    Fracture of superior pubic ramus (HCC) 11/28/2018   GERD (gastroesophageal reflux disease)    Headache(784.0)    Hyperlipidemia    Interstitial cystitis    Joint pain    Lactose intolerance    Lung cancer ([place]) 2002   Obesity    Osteoarthritis    Palpitations    Sleep apnea    Doesn't use a CPAP   Suicidal ideation 01/20/2020   Swallowing difficulty     Current Outpatient Medications:    atorvastatin (LIPITOR) 20 MG tablet, TAKE (1) TABLET BY MOUTH AT BEDTIME., Disp: 90 tablet, Rfl: 0   CALCIUM PO, Take by mouth., Disp: , Rfl:    Carbamazepine (EQUETRO) 300 MG CP12, Take 1 capsule (300 mg total) by mouth daily., Disp: 30 capsule, Rfl: 0   carbamazepine (TEGRETOL) 100 MG chewable tablet, CHEW 1 TABLET BY MOUTH AT BEDTIME., Disp: 90 tablet, Rfl: 0   cariprazine (VRAYLAR) 3 MG  capsule, Take 1 capsule (3 mg total) by mouth daily., Disp: 30 capsule, Rfl: 1   celecoxib (CELEBREX) 50 MG capsule, Take 1 capsule (50 mg total) by mouth 2 (two) times daily. (Patient not taking: Reported on 05/17/2021), Disp: 60 capsule, Rfl: 0   cetirizine (ZYRTEC) 10 MG tablet, Take 1 tablet (10 mg total) by mouth daily. (Patient not taking: Reported on 05/17/2021), Disp: 90 tablet, Rfl: 3   chlordiazePOXIDE (LIBRIUM) 25 MG capsule, TAKE (1) CAPSULE BY MOUTH AT BEDTIME AS NEEDED FOR ANXIETY, Disp: 30 capsule, Rfl: 0   cholecalciferol (VITAMIN D) 25 MCG (1000 UNIT) tablet, Take 1,000 Units by mouth daily., Disp: , Rfl:    dexmethylphenidate (FOCALIN) 10 MG tablet, TAKE 1 AND 1/2 TABLETS BY MOUTH TWICE DAILY., Disp: 90 tablet, Rfl: 0   docusate sodium (COLACE) 100 MG capsule, Take 100 mg by mouth. 1 tab every other day. (Patient not taking: Reported on 05/17/2021), Disp: , Rfl:    doxycycline (VIBRA-TABS) 100 MG tablet, Take 1 tablet (100 mg total) by mouth 2 (two) times daily., Disp: 14 tablet, Rfl: 7   ferrous gluconate (FERGON) 324 MG tablet, Take 324 mg by mouth daily with breakfast., Disp: , Rfl:    fluticasone (FLONASE) 50  MCG/ACT nasal spray, Use 2 sprays in each nostril BID for a week. After 1 week, decrease to 1 spray in each nostril BID as needed for congestion/allergies. (Patient not taking: Reported on 05/17/2021), Disp: 16 g, Rfl: 6   lamoTRIgine (LAMICTAL) 200 MG tablet, TAKE (1) TABLET BY MOUTH TWICE DAILY., Disp: 60 tablet, Rfl: 0   lithium carbonate 150 MG capsule, Take 1 capsule (150 mg total) by mouth at bedtime. 1 tab at bedtime, Disp: 90 capsule, Rfl: 1   Loratadine 10 MG CAPS, Take by mouth., Disp: , Rfl:    Melatonin 10 MG CAPS, Take by mouth., Disp: , Rfl:    nitrofurantoin, macrocrystal-monohydrate, (MACROBID) 100 MG capsule, Take 1 capsule (100 mg total) by mouth 2 (two) times daily. (Patient not taking: Reported on 05/17/2021), Disp: 10 capsule, Rfl: 0   pantoprazole (PROTONIX)  40 MG tablet, Take 1 tablet (40 mg total) by mouth daily., Disp: 90 tablet, Rfl: 2  Social History   Tobacco Use  Smoking Status Never  Smokeless Tobacco Never    Allergies  Allergen Reactions   Azithromycin Anaphylaxis   Penicillins Anaphylaxis    DID THE REACTION INVOLVE: Swelling of the face/tongue/throat, SOB, or low BP? Yes Sudden or severe rash/hives, skin peeling, or the inside of the mouth or nose? Yes Did it require medical treatment? No When did it last happen?       If all above answers are "NO", may proceed with cephalosporin use.   Adhesive [Tape] Other (See Comments)    On bandaids   Objective:  There were no vitals filed for this visit. There is no height or weight on file to calculate BMI. Constitutional Well developed. Well nourished.  Vascular Dorsalis pedis pulses palpable bilaterally. Posterior tibial pulses palpable bilaterally. Capillary refill normal to all digits.  No cyanosis or clubbing noted. Pedal hair growth normal.  Neurologic Normal speech. Oriented to person, place, and time. Epicritic sensation to light touch grossly present bilaterally.  Dermatologic Nails well groomed and normal in appearance. No open wounds. No skin lesions.  Orthopedic:  Pain on palpation to the medial lateral gutter of the ankle joint.  Pain with posterior ankle joint as well.  No pain at the peroneal tendon, posterior tibial tendon, Achilles tendon, ATFL.  Pain with range of motion of the ankle joint dorsiflexion as well as plantarflexion active and passive.   Radiographs: 3 views of skeletally mature adult left foot: No fractures noted.  No bony abnormality noted.  Osteoarthritic changes noted to the ankle joint.  No other bony abnormalities noted Assessment:   1. Capsulitis of ankle, left   2. Osteochondral talar dome lesion     Plan:  Patient was evaluated and treated and all questions answered.  Left foot injury -Clinically patient's pain seems to be more  exacerbated after she had a fall accident.  Seems like the osteochondral lesion may have been more disrupted and causing a lot of pain in the ankle joint.  I believe she will benefit from injection as described below.  Left osteochondral lesion of the talar dome medial and lateral -Given that her pain is recurring every 3 months or so.  I do not mind continuing to do steroid injection if she tends to get about 3 to 4 months of relief.  Patient states understanding would like to proceed with a steroid injection. -Another steroid injection was performed at left ankle using 1% plain Lidocaine and 10 mg of Kenalog. This was well tolerated. -I will  see her back again in 3 months for evaluation and management of the left chronic ankle pain.  I again we discussed with her the some of the surgical options that are available.  For now we will hold off on as she has familial commitments. -Continue using Celebrex  No follow-ups on file.

## 2021-06-14 ENCOUNTER — Encounter: Payer: Self-pay | Admitting: Psychiatry

## 2021-06-14 ENCOUNTER — Ambulatory Visit: Payer: Medicare Other | Admitting: Psychiatry

## 2021-06-14 ENCOUNTER — Other Ambulatory Visit: Payer: Self-pay

## 2021-06-14 DIAGNOSIS — R251 Tremor, unspecified: Secondary | ICD-10-CM

## 2021-06-14 DIAGNOSIS — R7989 Other specified abnormal findings of blood chemistry: Secondary | ICD-10-CM

## 2021-06-14 DIAGNOSIS — R296 Repeated falls: Secondary | ICD-10-CM

## 2021-06-14 DIAGNOSIS — F900 Attention-deficit hyperactivity disorder, predominantly inattentive type: Secondary | ICD-10-CM

## 2021-06-14 DIAGNOSIS — F319 Bipolar disorder, unspecified: Secondary | ICD-10-CM | POA: Diagnosis not present

## 2021-06-14 DIAGNOSIS — F411 Generalized anxiety disorder: Secondary | ICD-10-CM

## 2021-06-14 DIAGNOSIS — G2401 Drug induced subacute dyskinesia: Secondary | ICD-10-CM

## 2021-06-14 DIAGNOSIS — G3184 Mild cognitive impairment, so stated: Secondary | ICD-10-CM | POA: Diagnosis not present

## 2021-06-14 NOTE — Progress Notes (Signed)
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   Anxiety    Arthritis    Atrophy of vagina 10/07/2020   Bipolar 1 disorder (HCC)    Cancer ([place])    Colon polyps    Depression    Diabetes mellitus ([place])    Edema, lower extremity    Epistaxis    Around 2011 or 2012, required cauterization.    Esophageal stricture    Fracture of superior pubic ramus (HCC) 11/28/2018   GERD (gastroesophageal reflux disease)    Headache(784.0)    Hyperlipidemia    Interstitial cystitis    Joint pain    Lactose intolerance    Lung cancer ([place]) 2002   Obesity    Osteoarthritis    Palpitations    Sleep apnea    Doesn't use a CPAP   Suicidal ideation 01/20/2020   Swallowing difficulty     Family History  Problem Relation Age of Onset   Arthritis Mother    Hearing loss Mother    Hyperlipidemia Mother    Hypertension Mother    Depression Mother    Anxiety disorder Mother    Obesity Mother    Sudden death Mother    Hypertension Father    Diabetes Mellitus II Father    Heart disease Father    Arthritis Father    Cancer Father        Brain   COPD Father    Diabetes  Father    Hyperlipidemia Father    Sleep apnea Father    Early death Sister        Aneroxia/Bulimic   Depression Brother    Early death Proofreader Accident   Depression Daughter    Drug abuse Daughter    Heart disease Daughter    Hypertension Daughter    Stroke Maternal Grandmother    Hypertension Maternal Grandmother    Arthritis Maternal Grandfather    Heart attack Maternal Grandfather    Hearing loss Maternal Grandfather    Colon cancer Neg Hx    Esophageal cancer Neg Hx    Rectal cancer Neg Hx     Social History   Socioeconomic History   Marital status: Married    Spouse name: Not on file   Number of children: 1   Years of education: Not on  file   Highest education level: Not on file  Occupational History   Occupation: Admin. assistant  Tobacco Use   Smoking status: Never   Smokeless tobacco: Never  Vaping Use   Vaping Use: Never used  Substance and Sexual Activity   Alcohol use: Yes    Alcohol/week: 1.0 standard drink    Types: 1 Glasses of wine per week    Comment: Moderate   Drug use: No   Sexual activity: Yes  Other Topics Concern   Not on file  Social History Narrative   Pt lives in [place] with husband Lanny Hurst.  Followed by Dr. Clovis Pu for psychiatry and Rinaldo Cloud for therapy.   Social Determinants of Health   Financial Resource Strain: Medium Risk   Difficulty of Paying Living Expenses: Somewhat hard  Food Insecurity: No Food Insecurity   Worried About Charity fundraiser in the Last Year: Never true   Ran Out of Food in the Last Year: Never true  Transportation Needs: No Transportation Needs   Lack of Transportation (Medical): No   Lack of Transportation (Non-Medical): No  Physical Activity: Inactive   Days of Exercise per Week: 0 days   Minutes of Exercise per Session: 0 min  Stress: No Stress Concern Present   Feeling of Stress : Only a little  Social Connections: Moderately Integrated   Frequency of Communication with Friends  and Family: More than three times a week   Frequency of Social Gatherings with Friends and Family: More than three times a week   Attends Religious Services: More than 4 times per year   Active Member of [employer] or Organizations: No   Attends Archivist Meetings: Never   Marital Status: Married  Human resources officer Violence: Not At Risk   Fear of Current or Ex-Partner: No   Emotionally Abused: No   Physically Abused: No   Sexually Abused: No    Past Medical History, Surgical history, Social history, and Family history were reviewed and updated as appropriate.   Please see review of systems for further details on the patient's review from today.   Objective:   Physical Exam:  There were no vitals taken for this visit.  Physical Exam Constitutional:      General: She is not in acute distress. Musculoskeletal:        General: No deformity.  Neurological:     Mental Status: She is alert and oriented to person, place, and time.     Cranial Nerves: No dysarthria.     Motor: Tremor present.     Coordination: Coordination normal.     Comments: Mild resting R hand rotational tremor Fidgety mildy with feet Very mild lip pursing.  Psychiatric:        Attention and Perception: Attention and perception normal. She does not perceive auditory or visual hallucinations.        Mood and Affect: Mood is anxious. Mood is not depressed. Affect is not labile, blunt, angry or inappropriate.        Speech: Speech normal. Speech is not rapid and pressured.        Behavior: Behavior normal. Behavior is not agitated. Behavior is cooperative.        Thought Content: Thought content normal. Thought content is not paranoid or delusional. Thought content does not include homicidal or suicidal ideation. Thought content does not include homicidal or suicidal plan.        Cognition and Memory: Cognition and memory normal.  Judgment: Judgment normal.     Comments: Insight intact Less hypomania  but more depression. Still angry too easily.     Lab Review:     Component Value Date/Time   NA 139 01/28/2021 0926   K 4.6 01/28/2021 0926   CL 104 01/28/2021 0926   CO2 20 01/28/2021 0926   GLUCOSE 91 01/28/2021 0926   GLUCOSE 102 (H) 01/19/2020 1158   BUN 28 (H) 01/28/2021 0926   CREATININE 0.87 01/28/2021 0926   CALCIUM 9.1 01/28/2021 0926   PROT 6.4 01/28/2021 0926   ALBUMIN 4.0 01/28/2021 0926   AST 16 01/28/2021 0926   ALT 13 01/28/2021 0926   ALKPHOS 64 01/28/2021 0926   BILITOT 0.2 01/28/2021 0926   GFRNONAA 83 03/02/2020 1433   GFRAA 96 03/02/2020 1433       Component Value Date/Time   WBC 8.4 01/28/2021 0926   WBC 7.4 01/19/2020 1158   RBC 4.47 01/28/2021 0926   RBC 4.19 01/19/2020 1158   HGB 13.2 01/28/2021 0926   HCT 40.4 01/28/2021 0926   PLT 231 01/28/2021 0926   MCV 90 01/28/2021 0926   MCH 29.5 01/28/2021 0926   MCH 30.3 01/19/2020 1158   MCHC 32.7 01/28/2021 0926   MCHC 32.1 01/19/2020 1158   RDW 13.0 01/28/2021 0926   LYMPHSABS 3.5 (H) 01/28/2021 0926   MONOABS 0.7 04/04/2019 1004   EOSABS 0.3 01/28/2021 0926   BASOSABS 0.1 01/28/2021 0926  Vitamin D level 33 on 10K units daily on 12/4/`9 Increased to prescription vitamin d 50K units Monday, Wed, Friday.  Rx sent in.   Lithium Lvl  Date Value Ref Range Status  10/21/2018 0.18 (L) 0.60 - 1.20 mmol/L Final    Comment:    Performed at [facility], [address] [place], [place] [address]     No results found for: PHENYTOIN, PHENOBARB, VALPROATE, CBMZ   .res Assessment: Plan:    Bipolar I disorder with rapid cycling ([place])  Generalized anxiety disorder  Attention deficit hyperactivity disorder (ADHD), predominantly inattentive type  Mild cognitive impairment  Tremor of both hands  Tardive dyskinesia  Low vitamin D level  Falling  Anwitha has chronic rapid cycling bipolar disorder which is chronically unstable and has been difficult to control.  The rapid cycling is making it  difficult to control frequency of depressive episodes and the anxiety as well.  We have typically had to make frequent med changes.  We have had to reduce mood stabilizer dosages including Vraylar and Equetro she is very sensitive to carbamazepine because higher doses cause dizziness.    Since last visit cycled out of mania and into depression.  More cycling with less Vraylar.  No easy solution to this.  She's tried all FDA approved meds for bipolar depression except Caplyta which she can't afford either.  Failed many other meds too. Since the last appointment she cycled from depression into mania. Disc ECT.  She was denie patient assistance with Vraylar. Currently on 1.5 mg daily. Mouth movements tolerable.  Some grinding of teeth during the day.  She is more depressed at the moment but it is likely that the added stress with her husband is a contributing factor and unlikely that any immediate med change is going to help.  She is failed multiple prior meds for depression.  Caplyta is the only reasonable alternative and as mentioned is going to be expensive and probably unobtainable.  But will try Caplyta and stop Vraylar  Wear a mouthguard bc  of teeth grinding.    Prone to UTI and may be causing confusion discussed.  Had confirmed UTI recently.  Since last year when she was stable she started having hypomanic symptoms again.  We increased Equetro back to 300 mg nightly but she she has had balance problems. It had stopped the manic symptoms but then she started having balance problems again and we had to switch it back to Equetro 200 and add carbamazepine immediate release 100 nightly.Marland Kitchen  However now need to retry Equetro 300 mg HS DT hypomania.  Disc fall risk and be careful.  Hypomania , manic sx are serious bc caused dangerous driving.  Out of work ADD less of a problem.  Discussed the risk of stimulants including that that could contribute to mood instability and mood swings.    She has a high  residual anxiety.  It has been impossible to control all of her symptoms simultaneously without causing side effects.  She agrees.  Continue the following Discussed side effects of each medicine. Continue   Focalin XR 20 mg  To IR 15 mg BID DT Cost and off label for depression  Try to spread this out if possible for mood.  Continue Equetro 300 mg nightly Carbamazepine immediate release 100 mg nightly Lamotrigine 200 mg twice daily Lithium 150 mg nightly Continue Librium 25 HS bc needed for sleep Stop Vraylar and trial Caplyta for depression Disc risk worsening TD and tremor.    If cannot get some meds covered will have marked relapse risks DT multiple other med failures and intolerances.  Discussed potential metabolic side effects associated with atypical antipsychotics, as well as potential risk for movement side effects. Advised pt to contact office if movement side effects occur.  She may be having some mild TD with toe movement and grimacing.  Disc this in detail.  At this point it's tolerable.  . Suspect it.  "I don't realize it". But sometimes others notice  Option reduce Librium but she has no hangover and doesn't want to reduce it. No additonal sedatives during the day which fight the benefit of the stimulants and be more likely to trigger depression and poor activitty level. We discussed the short-term risks associated with benzodiazepines including sedation and increased fall risk among others.  Discussed long-term side effect risk including dependence, potential withdrawal symptoms, and the potential eventual dose-related risk of dementia.  But recent studies from 2020 dispute this association between benzodiazepines and dementia risk. Newer studies in 2020 do not support an association with dementia.  Disc SE meds and this is heightened by the complication of necessary polypharmacy.  Supportive therapy in terms of dealing with husband's addiction and now new dx metastatic prostate  CA.Marland Kitchen Disc poss PT job to help with socialization.  Requires frequent FU DT chronic instability.  Wants to schedule monthly.  FU 4 weeks  Lynder Parents MD, DFAPA  Please see After Visit Summary for patient specific instructions.    Future Appointments  Date Time Provider [place]  06/17/2021 10:00 AM Shanon Ace, LCSW CP-CP None  06/24/2021 10:00 AM Shanon Ace, LCSW CP-CP None  07/08/2021 10:00 AM Shanon Ace, LCSW CP-CP None  07/12/2021  9:00 AM Cottle, Billey Co., MD CP-CP None  07/22/2021 10:00 AM Shanon Ace, LCSW CP-CP None  08/06/2021 10:40 AM Lindell Spar, MD RPC-RPC RPC  08/09/2021 10:00 AM Cottle, Billey Co., MD CP-CP None  09/08/2021  2:00 PM Cottle, Billey Co., MD CP-CP None  10/06/2021  2:00 PM Cottle, Billey Co., MD CP-CP None  11/03/2021  2:00 PM Cottle, Billey Co., MD CP-CP None    No orders of the defined types were placed in this encounter.     -------------------------------

## 2021-06-15 ENCOUNTER — Other Ambulatory Visit: Payer: Self-pay | Admitting: Psychiatry

## 2021-06-15 DIAGNOSIS — F319 Bipolar disorder, unspecified: Secondary | ICD-10-CM

## 2021-06-17 ENCOUNTER — Other Ambulatory Visit (HOSPITAL_COMMUNITY): Payer: Self-pay | Admitting: Internal Medicine

## 2021-06-17 ENCOUNTER — Telehealth: Payer: Self-pay | Admitting: Psychiatry

## 2021-06-17 ENCOUNTER — Telehealth: Payer: Medicare Other | Admitting: Psychiatry

## 2021-06-17 ENCOUNTER — Other Ambulatory Visit: Payer: Self-pay | Admitting: Psychiatry

## 2021-06-17 DIAGNOSIS — F319 Bipolar disorder, unspecified: Secondary | ICD-10-CM | POA: Diagnosis not present

## 2021-06-17 DIAGNOSIS — Z1231 Encounter for screening mammogram for malignant neoplasm of breast: Secondary | ICD-10-CM

## 2021-06-17 NOTE — Progress Notes (Signed)
[facility] Counselor/Therapist Progress Note  Patient ID: Megan Whitney, MRN: [id number],    Date: 06/17/2021  Time Spent: 48 minutes   Virtual Visit via MyChart Video Note:  [place] with patient by a telemedicine/telehealth application, with their informed consent, and verified patient privacy and that I am speaking with the correct person using two identifiers. I discussed the limitations, risks, security and privacy concerns of performing psychotherapy and the availability of in person appointments. I also discussed with the patient that there may be a patient responsible charge related to this service. The patient expressed understanding and agreed to proceed. I discussed the treatment planning with the patient. The patient was provided an opportunity to ask questions and all were answered. The patient agreed with the plan and demonstrated an understanding of the instructions. The patient was advised to call  our office if  symptoms worsen or feel they are in a crisis state and need immediate contact.   Therapist Location: [facility] Patient Location: home    Treatment Type: Individual Therapy  Reported Symptoms:  depression, anxiety, tearfulness, anger; States she got worse after last appt when she was doing better and a switch was made in her antidepressant med. Reports physical side effects after medication change and plans to check back with Dr. Clovis Pu today on that.  Mental Status Exam:  Appearance:   Casual     Behavior:  Appropriate, Sharing, and Motivated  Motor:  Normal  Speech/Language:   Clear and Coherent  Affect:  Depressed and anxious  Mood:  anxious and depressed  Thought process:  goal directed  Thought content:    Some obsessiveness and overthinking  Sensory/Perceptual disturbances:    WNL  Orientation:  oriented to person, place, time/date, situation, day of week, month of year, year, and stated date of Oct. 13, 2022   Attention:  Fair  Concentration:  Fair  Memory:  [place] of knowledge:   Good  Insight:    Fair  Judgment:   Good and Fair  Impulse Control:  Good   Risk Assessment: Danger to Self:  No Self-injurious Behavior: No Danger to Others: No Duty to Warn:no Physical Aggression / Violence:No  Access to Firearms a concern: No  Gang Involvement:No   Subjective: Patient today reporting depression, anxiety, tearfulness off and on, sleep has been worse past couple nights in "not staying asleep." Husband had recent episode of drinking again for  2-3 days. Feels she and husband are getting along a little better. No recent verbal aggression with husband. Denies any racing thoughts.  Does not seem to be as volatile today as what she described the previous several days have been, but does report being very tired.  Had a better time with her knitting group last night which has been considering stopping their group meetings. Shares that now the group plans to continue for now. Continues coping with husband's cancer and feels she is doing as well as possible, and trying not to respond every time he says something that irritates her.  He is checking with her doctor about this newer medication and her side effects.  Continues to stay in touch with supportive friends and family which is helpful for her.    Interventions: Solution-Oriented/Positive Psychology, Ego-Supportive, and Insight-Oriented  Diagnosis:   ICD-10-CM   1. Bipolar I disorder with rapid cycling ([place])  F31.9      Treatment Goal Plan of Care: Patient not signing tx plan on computer screen  due to COVID. Treatment Goals: Goals remain on plan as patient works on strategies to meet her goals.  Progress is noted each visit in "Progress" section of goal plan. Long Term Goal: Reduce overall level, frequency, and intensity of the anxiety so that daily functioning is not impaired. Short Term Goal: 1.Increase understanding of the beliefs and  messages that produce the worry and anxiety. Strategies: 1.Help client develop reality-based positive cognitive messages/self-talk. 2. Develop a "coping card" or other reminder which coping strategies are recorded for patient's later use.    Plan: Patient today showing motivation and participated well in session as she talked through her most recent few days when she "had a setback after having better good days prior to that".  Disappointed and sad but towards the end of session was feeling tired but not as sad, which is probably more related to her having talked through her concerns for herself and husband with his cancer and his choice to drink again.  Denies any SI and does feel supported by the palliative care people involved as well as some family members and her friends.  Is very glad that her knitting group is not going to choose to disband at this point as she had previously thought.  That has been a real ongoing source of support for her and she looks forward to it.  Encouraged patient in continuing to practice positive behaviors that have been helpful to her previously including: Daily focusing on the present and what she can control or change, taking a break from her electronics at times, trying to increase her patience when things do not go as planned, searching for the positives within herself, intentionally looking for more positives than negatives daily, staying in touch with supportive people, interrupting anxious/fearful/depressive/negative thoughts and challenge and replace them with more realistic and empowering thoughts, remain in contact with her church friends, and stay involved in church groups that she enjoys, getting outside daily and walk as she is able, stop self negating, remain involved with her exercise and knitting groups, stop assuming worst case scenarios, consistent positive self talk, decrease overthinking and overanalyzing, set and keep appropriate boundaries with others as  needed, allow her faith to be a source of emotional support as well as spiritual, use active listening skills as discussed in session especially with her husband, paying attention to "how I say what I say", and recognize the strength she shows as she works with goal-directed behaviors to move in a direction that supports overall wellbeing and improved emotional health.  Goal review and progress/challenges noted with patient.  Next appointment within 2 weeks.  This record has been created using [employer].  Chart creation errors have been sought, but may not always have been located and corrected.  Such creation errors do not reflect on the standard of medical care provided.    Shanon Ace, LCSW

## 2021-06-17 NOTE — Telephone Encounter (Signed)
Rtc to pt and she reports vomiting and diarrhea with starting Caplyta. She also reports the vomit and diarrhea are "black" in color. Informed her I would talk to Dr. Clovis Pu but felt he may stop it to see if your symptoms improve.

## 2021-06-17 NOTE — Telephone Encounter (Signed)
Next visit is 08/09/21. Megan Whitney said she was given Caplyta samples to try. Has been using it for the past two days and has been vomiting and having diarrhea. Should she continue using it? Phone number is [phone].

## 2021-06-18 ENCOUNTER — Telehealth: Payer: Self-pay | Admitting: Psychiatry

## 2021-06-18 NOTE — Telephone Encounter (Signed)
Pt called asking if she can start taking Vraylar Monday morning? That would be 4 days. Contact # [phone]

## 2021-06-18 NOTE — Telephone Encounter (Signed)
Rtc to pt and explained the Vraylar and Caplyta interaction. She did also report with taking the Caplyta the 2 days she felt physically ill, didn't want to do anything, felt weak, sluggish. She just wanted to report everything before anything else.   Informed her I would update Dr. Clovis Pu and call back Monday. She also reports she was taking Vraylar daily.

## 2021-06-18 NOTE — Telephone Encounter (Signed)
We do not have samples of the lower dosage.  I do not think we can get them.  I am thinking that part of her problem may be related to an interaction between the [place].  Once the [place] Filter is more out of her system she may be able to tolerate the [place].  But it would take the Vraylar a week or 2 to clear enough out of her system before she could retry the [place].  The Caplyta is a capsule and it is possible she could reduce the dosage by dumping out part of the capsule and then putting the capsule back together and taking it but I do not advise that she try that right now.  The Vraylar was not working well enough.  Therefore we need to try something else and Caplyta is the best option available at this time.

## 2021-06-18 NOTE — Telephone Encounter (Signed)
Pt reports she didn't take Caplyta and is feeling better. She reports reading in her insurance information that Caplyta came in a lower dose. Informed her I was not aware of that but I would mention that to Dr. Clovis Pu. Advised her not to restart Vraylar until we got back with her. She agreed.

## 2021-06-18 NOTE — Telephone Encounter (Signed)
See previous phone message. 

## 2021-06-21 NOTE — Telephone Encounter (Signed)
Pt is notified and will contact office with any symptoms. She is concerned about being off Vraylar that long, I did discuss a long half life but to call me if anything is off.

## 2021-06-21 NOTE — Telephone Encounter (Signed)
Her concerns are noted.  Often side effects can occur at the initiation of a new medication but then resolve over the course of days to a couple of weeks.  Still the best option is to start [place] again but to wait until about 06/29/2021

## 2021-06-22 NOTE — Telephone Encounter (Signed)
Error

## 2021-06-24 ENCOUNTER — Other Ambulatory Visit: Payer: Self-pay

## 2021-06-24 ENCOUNTER — Ambulatory Visit: Payer: Medicare Other | Admitting: Psychiatry

## 2021-06-24 ENCOUNTER — Telehealth: Payer: Self-pay | Admitting: Psychiatry

## 2021-06-24 DIAGNOSIS — F319 Bipolar disorder, unspecified: Secondary | ICD-10-CM

## 2021-06-24 NOTE — Telephone Encounter (Signed)
Pt was in the offfice today to see therapist. She would like traci to call her at [phone]

## 2021-06-24 NOTE — Progress Notes (Signed)
[facility] Counselor/Therapist Progress Note  Patient ID: Megan Whitney, MRN: [id number],    Date: 06/24/2021  Time Spent: 50 minutes   Treatment Type: Individual Therapy  Reported Symptoms: anxiety, some specific anxiety in having her meds changed some recently  Mental Status Exam:  Appearance:   Casual     Behavior:  Appropriate, Sharing, and some motivation to get better  Motor:  Normal  Speech/Language:   Clear and Coherent  Affect:  Depressed, anxious  Mood:  anxious and depressed  Thought process:  goal directed  Thought content:    Some overthinking   Sensory/Perceptual disturbances:    WNL  Orientation:  oriented to person, place, time/date, situation, day of week, month of year, year, and stated date of Oct. 20, 2022  Attention:  Good  Concentration:  Fair  Memory:  WNL  Fund of knowledge:   Good  Insight:    Good and Fair  Judgment:   Good and Fair  Impulse Control:  Good   Risk Assessment: Danger to Self:  No Self-injurious Behavior: No Danger to Others: No Duty to Warn:no Physical Aggression / Violence:No  Access to Firearms a concern: No  Gang Involvement:No   Subjective:  Patient today reporting depression and anxiety.  Very little tearfulness in session today.  Upset that her medication is having to be changed and feels she's "already having depression from being off it." Talked through a lot of her concerns re: husband's cancer, her own behavioral health and some feelings of loneliness and ways to be more connected to others. Also processed her fearful/anxious thoughts re: herself and going through a medication change and trying to stop assuming the worst case scenarios.  Does continue to stay in touch with supportive friends and family which is helpful.  Encouraged her to continue this out reach to friends and also getting out of the house as she is able can also help her.  Their minister had reached out to them earlier and they discouraged any  visit or contact, but after talking with patient more today about that, she agreed to contact the minister and see if a visit might be possible.  Denies any current SI and knows she can call our office or go to the hospital in case she ever experiences any active SI and needs more immediate care.  Interventions: Solution-Oriented/Positive Psychology and Insight-Oriented  Diagnosis:   ICD-10-CM   1. Bipolar I disorder with rapid cycling ([place])  F31.9      Treatment Goal Plan of Care: Patient not signing tx plan on computer screen due to [place]. Treatment Goals: Goals remain on plan as patient works on strategies to meet her goals.  Progress is noted each visit in "Progress" section of goal plan. Long Term Goal: Reduce overall level, frequency, and intensity of the anxiety so that daily functioning is not impaired. Short Term Goal: 1.Increase understanding of the beliefs and messages that produce the worry and anxiety. Strategies: 1.Help client develop reality-based positive cognitive messages/self-talk. 2. Develop a "coping card" or other reminder which coping strategies are recorded for patient's later use.    Plan: Patient today showing motivation and engaged well in session.  Worked primarily on her ways of thinking and assuming worst case scenarios, and tending to always look for what might go wrong versus right.  This type of thinking right now is really weighing heavily on her as she goes through a medication change and also continues to cope with her  husband's cancer.  Encouraged her and continuing positive behaviors that have proven to be helpful in the past including: Taking a break from her electronics at times, focusing daily on the present and what she can control or change, trying to increase her patience when things do not go as planned, searching for the positives within herself, intentionally looking for more positives each day, staying in touch with supportive people, interrupting  anxious/fearful/depressive/negative thoughts and challenge and replace them with more realistic and empowering thoughts, remain in contact with her church friends, stay involved in church groups that she enjoys, get outside daily as she is able, stop self negating, remain involved with her exercise and knitting groups, stop assuming worst case scenarios, consistent positive self talk, decrease overthinking and over analyzing, pay attention to "how I say what I say", reverse her tendency to always look for what may go wrong versus right, believing herself more, and recognize the strain with she shows working with goal-directed behaviors to move in a direction that supports improved emotional health and overall wellbeing.  Goal review and progress/challenges noted.  Next appt within 2 weeks.  This record has been created using [employer].  Chart creation errors have been sought, but may not always have been located and corrected.  Such creation errors do not reflect on the standard of medical care provided.    Shanon Ace, LCSW

## 2021-06-25 ENCOUNTER — Other Ambulatory Visit: Payer: Self-pay | Admitting: Psychiatry

## 2021-06-25 NOTE — Telephone Encounter (Signed)
Rtc to pt and she reports clamping down and grinding on teeth for about a week now. She didn't know if that was a side effect of coming off Vraylar or ?  Also do you want her to restart Caplyta on Monday?

## 2021-06-28 ENCOUNTER — Other Ambulatory Visit: Payer: Self-pay

## 2021-06-28 ENCOUNTER — Ambulatory Visit (HOSPITAL_COMMUNITY)
Admission: RE | Admit: 2021-06-28 | Discharge: 2021-06-28 | Disposition: A | Discharge status: Home / Self Care | Payer: Medicare Other | Source: Ambulatory Visit | Attending: Internal Medicine | Admitting: Internal Medicine

## 2021-06-28 DIAGNOSIS — Z1231 Encounter for screening mammogram for malignant neoplasm of breast: Secondary | ICD-10-CM | POA: Diagnosis not present

## 2021-06-28 NOTE — Telephone Encounter (Signed)
It certainly is not a typical side effect of stopping Vraylar read regarding grinding her teeth.  However I cannot rule that possibility out.  Given her limited options I would like her to restart the Caplyta as we discussed previously.

## 2021-06-28 NOTE — Telephone Encounter (Signed)
Do you want her to restart medication today?

## 2021-06-28 NOTE — Telephone Encounter (Signed)
Her depression cycles fairly quickly.  If she is still feeling markedly depressed like she was last week then she can go ahead and restart the [place] today.  If she is not as depressed this week then hold off until she does become depressed and then restarted.  The longer she is off the Vraylar before restarting the Caplyta the better chance that she tolerates the Caplyta at this time

## 2021-06-29 NOTE — Telephone Encounter (Signed)
Given that we do not have many alternatives, I would like to give this medicine a full week to see if she will adjust.  If she does not adjust to it then we will stop it.  But she will just have to go back to [place] which was not working that well for her.  She will have too many mood swings if she does not take 1 or the other.

## 2021-06-29 NOTE — Telephone Encounter (Signed)
Rtc to pt and she reports she has had 2 doses of the Caplyta and reports feeling groggry, unsteady on her feet, she sounded tired and slow with her thoughts some as well. She also reports when coming off Vraylar she was having mouth sores and since starting Caplyta they are worsening. Advised her to swish around warm salt water to see if it would help any. She doesn't feel like she will be able to continue unless symptoms subside soon. Her husband reports she's more alert when off both medications. Informed her I would update Dr. Clovis Pu and call her back.

## 2021-06-30 NOTE — Telephone Encounter (Signed)
Pt aware to continue for a week if she is able to. She agreed and understood going back to Vraylar if not. Advised to follow up next week

## 2021-07-08 ENCOUNTER — Ambulatory Visit: Payer: Medicare Other | Admitting: Psychiatry

## 2021-07-08 ENCOUNTER — Other Ambulatory Visit: Payer: Self-pay

## 2021-07-08 DIAGNOSIS — F319 Bipolar disorder, unspecified: Secondary | ICD-10-CM | POA: Diagnosis not present

## 2021-07-08 NOTE — Progress Notes (Signed)
[facility] Counselor/Therapist Progress Note  Patient ID: Megan Whitney, MRN: [id number],    Date: 07/08/2021  Time Spent: 53 minutes   Treatment Type: Individual Therapy  Reported Symptoms: anxiety, depression, suicidal ideation (contracts for no self-harm)  Mental Status Exam:  Appearance:   Casual     Behavior:  Appropriate and Sharing  Motor:  Normal  Speech/Language:   Clear and Coherent  Affect:  Depressed, Tearful, and anxious  Mood:  anxious, depressed, and sad  Thought process:  goal directed  Thought content:    Some obsessiveness and overthinking  Sensory/Perceptual disturbances:    WNL  Orientation:  oriented to person, place, time/date, situation, day of week, month of year, year, and stated date of Nov. 3, 2022  Attention:  Good  Concentration:  Fair  Memory:  Some short term forgetting under stress  Fund of knowledge:   Good  Insight:    Fair  Judgment:   Good and Fair  Impulse Control:  Good and and contracted for no self-harm   Risk Assessment: Danger to Self:  No Self-injurious Behavior:  initially expressed SI which we processed in session, Pt contract for no suicidal behavior Danger to Others: No Duty to Warn:no Physical Aggression / Violence:No  Access to Firearms a concern: No  Gang Involvement:No   Subjective: Patient in today reporting increased anxiety and depression, suicidal ideation, increased tearfulness.  Feels new medication is creating issues for her like "blasting things out when I shouldn't, balance issues and reactionary time is affected, some grinding of teeth." States so many things bad have happened including: an issue with her ladies group from church, husband's psa is up, car is acting up, dogs may have ear mites.   Positives includes: some of their family are coming in today for a visit, long time friend coming over with lunch tomorrow, Keith's sister and husband are getting together with patient and Lanny Hurst. Became calmer after  talking through her concerns above and was emotionally grounded by session end.  Checked in with her again re: any SI and she agrees that she is feeling better and will not do anything to harm herself, knowing that she can call our office or the hospital if ever needed.  She is scheduled to see her prescribing psychiatrist Dr. Clovis Pu on Monday, November 7.  Interventions: Cognitive Behavioral Therapy, Solution-Oriented/Positive Psychology, and Ego-Supportive  Diagnosis:   ICD-10-CM   1. Bipolar I disorder with rapid cycling ([place])  F31.9       Treatment Goal Plan of Care: Patient not signing tx plan on computer screen due to [place]. Treatment Goals: Goals remain on plan as patient works on strategies to meet her goals.  Progress is noted each visit in "Progress" section of goal plan. Long Term Goal: Reduce overall level, frequency, and intensity of the anxiety so that daily functioning is not impaired. Short Term Goal: 1.Increase understanding of the beliefs and messages that produce the worry and anxiety. Strategies: 1.Help client develop reality-based positive cognitive messages/self-talk. 2. Develop a "coping card" or other reminder which coping strategies are recorded for patient's later use.   Plan: Patient today showing good motivation and was very actively involved in session as she worked with her symptoms of depression and anxiety that had increased most recently, as well as some personal and family situations.  She is not sure her current medication is helping and she is to discuss this with Dr. On Monday.  Trying to have a more  balanced view between positives and negatives as she admits to overly focusing on negatives recently and she has at least 3 significant positive events coming up between now and the weekend with family visits.  Encouraged patient in practicing positive behaviors that have proven to be helpful in the past including: Believing in herself more, practicing more  consistent positive self talk, looking more for what might go right versus wrong, taking a break from her electronics, focusing daily on the present and what she can change or control, increase her patience when things do not go as planned, searching for the positives within herself, intentionally looking for more positives than negatives each day, staying in touch with supportive people, interrupting anxious/fearful/depressive/negative thoughts and challenge/replace them with more realistic and empowering thoughts, remain in contact with church friends, stay involved with church groups that she enjoys, get outside some daily as she is able, stop self negating, stop assuming worst case scenarios, decrease overthinking and over analyzing, pay attention to "how I say what I say", reverse her tendency to always look for the negatives and second guess people or situations, and recognize the strength she shows working with goal-directed behaviors to move in a direction that supports improved emotional health and overall wellbeing. Today, Contracted for no suicidal behavior and reported no SI upon leaving.  Goal review and progress/challenges noted with patient.  Next appointment within 2 weeks.  This record has been created using [employer].  Chart creation errors have been sought, but may not always have been located and corrected.  Such creation errors do not reflect on the standard of medical care provided.    Shanon Ace, LCSW

## 2021-07-09 NOTE — Telephone Encounter (Signed)
Pt still is reporting side effects from [place] but will continue until her apt Monday 11/07

## 2021-07-12 ENCOUNTER — Other Ambulatory Visit: Payer: Self-pay

## 2021-07-12 ENCOUNTER — Encounter: Payer: Self-pay | Admitting: Psychiatry

## 2021-07-12 ENCOUNTER — Ambulatory Visit: Payer: Medicare Other | Admitting: Psychiatry

## 2021-07-12 VITALS — BP 113/78 | HR 104

## 2021-07-12 DIAGNOSIS — R296 Repeated falls: Secondary | ICD-10-CM

## 2021-07-12 DIAGNOSIS — R7989 Other specified abnormal findings of blood chemistry: Secondary | ICD-10-CM

## 2021-07-12 DIAGNOSIS — G2401 Drug induced subacute dyskinesia: Secondary | ICD-10-CM

## 2021-07-12 DIAGNOSIS — F900 Attention-deficit hyperactivity disorder, predominantly inattentive type: Secondary | ICD-10-CM

## 2021-07-12 DIAGNOSIS — R251 Tremor, unspecified: Secondary | ICD-10-CM

## 2021-07-12 DIAGNOSIS — G3184 Mild cognitive impairment, so stated: Secondary | ICD-10-CM

## 2021-07-12 DIAGNOSIS — F319 Bipolar disorder, unspecified: Secondary | ICD-10-CM

## 2021-07-12 DIAGNOSIS — F411 Generalized anxiety disorder: Secondary | ICD-10-CM

## 2021-07-12 NOTE — Patient Instructions (Signed)
Vraylar 1.5 mg daily or 3 mg every other day Stop Caplyta

## 2021-07-12 NOTE — Progress Notes (Addendum)
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 ramus (HCC) 11/28/2018   GERD (gastroesophageal reflux disease)    Headache(784.0)    Hyperlipidemia    Interstitial cystitis    Joint pain    Lactose intolerance    Lung cancer ([place]) 2002   Obesity    Osteoarthritis    Palpitations    Sleep apnea    Doesn't use a CPAP   Suicidal ideation 01/20/2020   Swallowing difficulty     Family History  Problem Relation Age of Onset   Arthritis Mother    Hearing loss Mother    Hyperlipidemia Mother    Hypertension Mother    Depression Mother    Anxiety disorder Mother    Obesity Mother    Sudden death Mother    Hypertension Father    Diabetes Mellitus II Father    Heart disease Father    Arthritis Father    Cancer Father        Brain   COPD Father    Diabetes Father    Hyperlipidemia Father    Sleep apnea Father    Early death Sister        Aneroxia/Bulimic   Depression Brother    Early death Proofreader Accident   Depression Daughter    Drug abuse Daughter    Heart disease Daughter    Hypertension Daughter    Stroke Maternal Grandmother     Hypertension Maternal Grandmother    Arthritis Maternal Grandfather    Heart attack Maternal Grandfather    Hearing loss Maternal Grandfather    Colon cancer Neg Hx    Esophageal cancer Neg Hx    Rectal cancer Neg Hx     Social History   Socioeconomic History   Marital status: Married    Spouse name: Not on file   Number of children: 1   Years of education: Not on file   Highest education level: Not on file  Occupational History   Occupation: Admin. assistant  Tobacco Use   Smoking status: Never   Smokeless tobacco: Never  Vaping Use   Vaping Use: Never used  Substance and Sexual Activity   Alcohol use: Yes    Alcohol/week: 1.0 standard drink    Types:  1 Glasses of wine per week    Comment: Moderate   Drug use: No   Sexual activity: Yes  Other Topics Concern   Not on file  Social History Narrative   Pt lives in [place] with husband Lanny Hurst.  Followed by Dr. Clovis Pu for psychiatry and Rinaldo Cloud for therapy.   Social Determinants of Health   Financial Resource Strain: Medium Risk   Difficulty of Paying Living Expenses: Somewhat hard  Food Insecurity: No Food Insecurity   Worried About Charity fundraiser in the Last Year: Never true   Ran Out of Food in the Last Year: Never true  Transportation Needs: No Transportation Needs   Lack of Transportation (Medical): No   Lack of Transportation (Non-Medical): No  Physical Activity: Inactive   Days of Exercise per Week: 0 days   Minutes of Exercise per Session: 0 min  Stress: No Stress Concern Present   Feeling of Stress : Only a little  Social Connections: Moderately Integrated   Frequency of Communication with Friends and Family: More than three times a week   Frequency of Social Gatherings with Friends and Family: More than three times a week   Attends Religious Services: More than 4 times per year   Active Member of [employer] or Organizations: No   Attends Archivist Meetings: Never   Marital Status: Married   Human resources officer Violence: Not At Risk   Fear of Current or Ex-Partner: No   Emotionally Abused: No   Physically Abused: No   Sexually Abused: No    Past Medical History, Surgical history, Social history, and Family history were reviewed and updated as appropriate.   Please see review of systems for further details on the patient's review from today.   Objective:   Physical Exam:  BP 113/78   Pulse (!) 104   Physical Exam Constitutional:      General: She is not in acute distress. Musculoskeletal:        General: No deformity.  Neurological:     Mental Status: She is alert and oriented to person, place, and time.     Cranial Nerves: No dysarthria.     Motor: Tremor present.     Coordination: Coordination normal.     Comments: Mild resting R hand rotational tremor Fidgety mildy with feet Very mild lip pursing.  Psychiatric:        Attention and Perception: Attention and perception normal. She does not perceive auditory or visual hallucinations.        Mood and Affect: Mood is anxious. Mood is not depressed. Affect is not labile, blunt, angry or inappropriate.        Speech: Speech normal. Speech is not rapid and pressured.        Behavior: Behavior normal. Behavior is not agitated. Behavior is cooperative.        Thought Content: Thought content normal. Thought content is not paranoid or delusional. Thought content does not include homicidal or suicidal ideation. Thought content does not include homicidal or suicidal plan.        Cognition and Memory: Cognition and memory normal.        Judgment: Judgment normal.     Comments: Insight intact Less hypomania but more depression. Still angry too easily.     Lab Review:     Component Value Date/Time   NA 139 01/28/2021 0926   K 4.6 01/28/2021 0926   CL 104 01/28/2021 0926   CO2 20 01/28/2021 0926  GLUCOSE 91 01/28/2021 0926   GLUCOSE 102 (H) 01/19/2020 1158   BUN 28 (H) 01/28/2021 0926   CREATININE 0.87 01/28/2021  0926   CALCIUM 9.1 01/28/2021 0926   PROT 6.4 01/28/2021 0926   ALBUMIN 4.0 01/28/2021 0926   AST 16 01/28/2021 0926   ALT 13 01/28/2021 0926   ALKPHOS 64 01/28/2021 0926   BILITOT 0.2 01/28/2021 0926   GFRNONAA 83 03/02/2020 1433   GFRAA 96 03/02/2020 1433       Component Value Date/Time   WBC 8.4 01/28/2021 0926   WBC 7.4 01/19/2020 1158   RBC 4.47 01/28/2021 0926   RBC 4.19 01/19/2020 1158   HGB 13.2 01/28/2021 0926   HCT 40.4 01/28/2021 0926   PLT 231 01/28/2021 0926   MCV 90 01/28/2021 0926   MCH 29.5 01/28/2021 0926   MCH 30.3 01/19/2020 1158   MCHC 32.7 01/28/2021 0926   MCHC 32.1 01/19/2020 1158   RDW 13.0 01/28/2021 0926   LYMPHSABS 3.5 (H) 01/28/2021 0926   MONOABS 0.7 04/04/2019 1004   EOSABS 0.3 01/28/2021 0926   BASOSABS 0.1 01/28/2021 0926  Vitamin D level 33 on 10K units daily on 12/4/`9 Increased to prescription vitamin d 50K units Monday, Wed, Friday.  Rx sent in.   Lithium Lvl  Date Value Ref Range Status  10/21/2018 0.18 (L) 0.60 - 1.20 mmol/L Final    Comment:    Performed at [facility], [address] [place], [place] [address]     No results found for: PHENYTOIN, PHENOBARB, VALPROATE, CBMZ   .res Assessment: Plan:    Bipolar I disorder with rapid cycling ([place])  Generalized anxiety disorder  Attention deficit hyperactivity disorder (ADHD), predominantly inattentive type  Mild cognitive impairment  Tremor of both hands  Tardive dyskinesia  Low vitamin D level  Falling  Jackline has chronic rapid cycling bipolar disorder which is chronically unstable and has been difficult to control.  The rapid cycling is making it difficult to control frequency of depressive episodes and the anxiety as well.  We have typically had to make frequent med changes.  We have had to reduce mood stabilizer dosages including Vraylar and Equetro she is very sensitive to carbamazepine because higher doses cause dizziness.    Since last visit cycled out of mania  and into depression.  More cycling with less Vraylar.  No easy solution to this.  She's tried all FDA approved meds for bipolar depression.  Failed many other meds too. Since the last appointment she cycled into depression  Disc ECT. Only FDA approved option left. She was denie patient assistance with Vraylar. Try again  She is more depressed at the moment but it is likely that the added stress with her husband is a contributing factor and unlikely that any immediate med change is going to help.  She is failed multiple prior meds for depression.   Few options left except return to [place] bc had less SE vs Caplyta. Only other option reasonable is ECT  Wear a mouthguard bc of teeth grinding.    Prone to UTI and may be causing confusion discussed.  Had confirmed UTI recently.  Since last year when she was stable she started having hypomanic symptoms again.  We increased Equetro back to 300 mg nightly but she she has had balance problems. It had stopped the manic symptoms but then she started having balance problems again and we had to switch it back to Equetro 200 and add carbamazepine immediate release 100 nightly.Marland Kitchen  However now need to retry Equetro 300 mg HS DT hypomania.  Disc fall risk and be careful.  Hypomania , manic sx are serious bc caused dangerous driving.  Out of work ADD less of a problem.  Discussed the risk of stimulants including that that could contribute to mood instability and mood swings.    She has a high residual anxiety.  It has been impossible to control all of her symptoms simultaneously without causing side effects.  She agrees.  Continue the following Discussed side effects of each medicine. Continue   Focalin XR 20 mg  To IR 15 mg BID DT Cost and off label for depression  Try to spread this out if possible for mood.  Continue Equetro 300 mg nightly Carbamazepine immediate release 100 mg nightly Lamotrigine 200 mg twice daily Lithium 150 mg nightly Continue  Librium 25 HS bc needed for sleep DC Caplyta and return Vraylar 1.5 mg daily Using samples Disc risk worsening TD and tremor.    Discussed potential metabolic side effects associated with atypical antipsychotics, as well as potential risk for movement side effects. Advised pt to contact office if movement side effects occur.  She may be having some mild TD with toe movement and grimacing.  Disc this in detail.  At this point it's tolerable.  . Suspect it.  "I don't realize it". But sometimes others notice  Option reduce Librium but she has no hangover and doesn't want to reduce it. No additonal sedatives during the day which fight the benefit of the stimulants and be more likely to trigger depression and poor activitty level. We discussed the short-term risks associated with benzodiazepines including sedation and increased fall risk among others.  Discussed long-term side effect risk including dependence, potential withdrawal symptoms, and the potential eventual dose-related risk of dementia.  But recent studies from 2020 dispute this association between benzodiazepines and dementia risk. Newer studies in 2020 do not support an association with dementia.  Disc SE meds and this is heightened by the complication of necessary polypharmacy.  Supportive therapy in terms of dealing with husband's addiction and now new dx metastatic prostate CA.Marland Kitchen Disc poss PT job to help with socialization.  Requires frequent FU DT chronic instability.  Wants to schedule monthly.  FU 4 weeks  Lynder Parents MD, DFAPA  Please see After Visit Summary for patient specific instructions.    Future Appointments  Date Time Provider [place]  07/22/2021 10:00 AM Shanon Ace, LCSW CP-CP None  08/05/2021 11:00 AM Shanon Ace, LCSW CP-CP None  08/06/2021 10:40 AM Lindell Spar, MD RPC-RPC RPC  08/09/2021 10:00 AM Cottle, Billey Co., MD CP-CP None  08/19/2021 10:00 AM Shanon Ace, LCSW CP-CP None   09/02/2021 10:00 AM Shanon Ace, LCSW CP-CP None  09/08/2021  2:00 PM Cottle, Billey Co., MD CP-CP None  09/16/2021 10:00 AM Shanon Ace, LCSW CP-CP None  09/30/2021 10:00 AM Shanon Ace, LCSW CP-CP None  10/06/2021  2:00 PM Cottle, Billey Co., MD CP-CP None  10/14/2021 10:00 AM Shanon Ace, LCSW CP-CP None  11/03/2021  2:00 PM Cottle, Billey Co., MD CP-CP None    No orders of the defined types were placed in this encounter.     -------------------------------

## 2021-07-14 ENCOUNTER — Other Ambulatory Visit: Payer: Self-pay | Admitting: Internal Medicine

## 2021-07-14 ENCOUNTER — Other Ambulatory Visit: Payer: Self-pay | Admitting: Psychiatry

## 2021-07-14 DIAGNOSIS — K219 Gastro-esophageal reflux disease without esophagitis: Secondary | ICD-10-CM

## 2021-07-14 DIAGNOSIS — F319 Bipolar disorder, unspecified: Secondary | ICD-10-CM

## 2021-07-14 DIAGNOSIS — E7849 Other hyperlipidemia: Secondary | ICD-10-CM

## 2021-07-22 ENCOUNTER — Other Ambulatory Visit: Payer: Self-pay

## 2021-07-22 ENCOUNTER — Ambulatory Visit: Payer: Medicare Other | Admitting: Psychiatry

## 2021-07-22 DIAGNOSIS — F319 Bipolar disorder, unspecified: Secondary | ICD-10-CM | POA: Diagnosis not present

## 2021-07-22 NOTE — Progress Notes (Signed)
[facility] Counselor/Therapist Progress Note  Patient ID: Megan Whitney, MRN: [id number],    Date: 07/22/2021  Time Spent: 50 minutes   Treatment Type: Individual Therapy  Reported Symptoms:  anxiety, depression, "but better than I was last visit"  Mental Status Exam:  Appearance:   Casual     Behavior:  Appropriate, Sharing, and Motivated  Motor:  Normal  Speech/Language:   Clear and Coherent  Affect:  Anxious, some depression but noticeably  Mood:  anxious and some depression  Thought process:  goal directed  Thought content:    Some obsessiveness  Sensory/Perceptual disturbances:    WNL  Orientation:  oriented to person, place, time/date, situation, day of week, month of year, year, and stated date of Nov. 17, 2022  Attention:  Fair  Concentration:  Good  Memory:  WNL  Fund of knowledge:   Good  Insight:    Good and Fair  Judgment:   Good  Impulse Control:  Good and Fair   Risk Assessment: Danger to Self:  No Self-injurious Behavior: No Danger to Others: No  Duty to Warn:no Physical Aggression / Violence:No  Access to Firearms a concern: No  Gang Involvement:No   Subjective:  Patient in today reporting anxiety as main symptom. Reporting and seeming less depressed and reports "being this improved about a week as soon as I got off prior medication."  No SI. Less tearfulness.  Still grinding teeth a lot and sees her dentist this week.  Denies any SI.  Processed more of her issues with husband going against medical advice and also the "bad things happening or afraid of happening last session."  Reports all of those situations either didn't happen or have resolved, so feeling better on those things that are important to her. Calmer today and smiling more. Discussing her coping with husband's cancer and his going against medical advice on some issues. Frustration but trying "not to let it eat me up."  Paying more attention to  what I can control versus what I can't.    Interventions: Cognitive Behavioral Therapy, Ego-Supportive, and Insight-Oriented  Diagnosis:   ICD-10-CM   1. Bipolar I disorder with rapid cycling ([place])  F31.9      Treatment Goal Plan of Care: Patient not signing tx plan on computer screen due to [place]. Treatment Goals: Goals remain on plan as patient works on strategies to meet her goals.  Progress is noted each visit in "Progress" section of goal plan. Long Term Goal: Reduce overall level, frequency, and intensity of the anxiety so that daily functioning is not impaired. Short Term Goal: 1.Increase understanding of the beliefs and messages that produce the worry and anxiety. Strategies: 1.Help client develop reality-based positive cognitive messages/self-talk. 2. Develop a "coping card" or other reminder which coping strategies are recorded for patient's later use.    Plan:  Patient today showing good motivation and participated well in session.  Explained that she "has had a better few days as soon as she got off her prior medication that was changed recently".  Smiled and actually laughed appropriately some.  Was frustrated with her husband that again, he is going against the advice of his doctor, and drinking.  Focusing on what she can control versus what she cannot control, and trying to have a more balanced view between negatives and positives.  Able to focus on some positives coming up for her and her husband as family members are getting together at upcoming Thanksgiving holiday.  Doing well and staying in touch with some of her friends as well as extended family.  Husband is having to do some oral chemo starting soon but his doctor feels that can help him.  Encouraged patient in her practice of positive behaviors that have proven to be helpful to her in the past including: Believing in herself more. Remembering to stay in the present focusing on what she can change or control versus cannot. Practicing consistent positive self  talk. Looking more for what might go right versus wrong. Taking a break from her electronics. Increase her patience when things do not go as planned or expected. Find the positives within herself. Stay on her medications as prescribed. Intentionally look for more positives than negatives each day. Stay in touch with supportive people. Interrupting anxious/fearful/depressive/negative thoughts; challenge/replace them with realistic thoughts. Remain in contact with church friends. Stay involved in the various church groups that she enjoys. Get outside daily as she is able. Stop assuming worst case scenarios. Stop self negating. Decrease overthinking and overanalyzing. Pay attention to "how I say what I say". Reverse her tendency to always look for the negatives and second guess people or situations. Realize the strength she shows working with goal-directed behaviors to move in a direction that supports overall wellbeing and improved emotional health.  Goal review and progress/challenges noted with patient.  Next appointment within 2 weeks.  This record has been created using [employer].  Chart creation errors have been sought, but may not always have been located and corrected.  Such creation errors do not reflect on the standard of medical care provided.    Shanon Ace, LCSW

## 2021-07-23 ENCOUNTER — Other Ambulatory Visit: Payer: Self-pay | Admitting: Psychiatry

## 2021-07-23 DIAGNOSIS — F314 Bipolar disorder, current episode depressed, severe, without psychotic features: Secondary | ICD-10-CM

## 2021-07-28 ENCOUNTER — Other Ambulatory Visit: Payer: Self-pay | Admitting: Psychiatry

## 2021-08-05 ENCOUNTER — Ambulatory Visit (INDEPENDENT_AMBULATORY_CARE_PROVIDER_SITE_OTHER): Payer: Medicare Other | Admitting: Psychiatry

## 2021-08-05 ENCOUNTER — Other Ambulatory Visit: Payer: Self-pay

## 2021-08-05 DIAGNOSIS — F319 Bipolar disorder, unspecified: Secondary | ICD-10-CM | POA: Diagnosis not present

## 2021-08-05 NOTE — Progress Notes (Signed)
[facility] Counselor/Therapist Progress Note  Patient ID: Megan Whitney, MRN: [id number],    Date: 08/05/2021  Time Spent: 52 minutes   Treatment Type: Individual Therapy  Reported Symptoms: Patient reports anxiety, depression, tearfulness,  "I've been all over the place, prior SI but "not now", some impulsivity.   Mental Status Exam:  Appearance:   Casual     Behavior:  Appropriate and Sharing  Motor:  Normal  Speech/Language:   Clear and Coherent  Affect:  Depressed and anxious  Mood:  anxious and depressed  Thought process:  goal directed  Thought content:    overthinking  Sensory/Perceptual disturbances:    WNL  Orientation:  oriented to person, place, time/date, situation, day of week, month of year, year, and stated date of Dec. 1, 2022  Attention:  Fair  Concentration:  Good and Fair  Memory:  [place] of knowledge:   Good  Insight:    Good and Fair  Judgment:   Good  Impulse Control:  Fair   Risk Assessment: Danger to Self:  No Self-injurious Behavior: No Danger to Others: No Duty to Warn:no Physical Aggression / Violence:No  Access to Firearms a concern: No  Gang Involvement:No   Subjective: Patient in today reporting anxiety, depression, some tearfulness, states "I've been all over the place, prior SI but denies any currently.  Having issues with her knee and has appt with her primary care Dr. Denies any current impulsivity.  Does feel she progresses at times with her symptoms "but sometimes I have a rough days and weeks but is doesn't take away the times with I'm doing better."Has a few friends that are good for her and she enjoys being with them/talking  & texting to them. Reports her "teeth grinding" has not been as bad recently. States it helps her "a lot to come and talk through my anxious, indecisive and depressed/fearful thoughts and be able to feel lighter and more supported" to move forward. Shows more assertiveness today in her  communication.  Interventions: Cognitive Behavioral Therapy, Solution-Oriented/Positive Psychology, and Ego-Supportive  Treatment Goal Plan of Care: Patient not signing tx plan on computer screen due to [place]. Treatment Goals: Goals remain on plan as patient works on strategies to meet her goals.  Progress is noted each visit in "Progress" section of goal plan. Long Term Goal: Reduce overall level, frequency, and intensity of the anxiety so that daily functioning is not impaired. Short Term Goal: 1.Increase understanding of the beliefs and messages that produce the worry and anxiety. Strategies: 1.Help client develop reality-based positive cognitive messages/self-talk. 2. Develop a "coping card" or other reminder which coping strategies are recorded for patient's later use.   Diagnosis:   ICD-10-CM   1. Bipolar I disorder with rapid cycling ([place])  F31.9      Plan: Patient today showing good motivation and active participation in session today. Feels "I've been all over the place lately with anxiety, anger, depression, tearfulness, frustration, No SI. Frustration "over not having control over husband's drinking, over his cancer, decision-making, and just little control over anything." States I do have control over my behavior, how I respond to  people, and how I reach out to people re: friendships. Very frustrated with husband's drinking again and going against medical advice. Focusing on what she can control versus cannot.Continues to do well in keeping contact with helpful friends.  Is looking forward to a visit from granddaughter later this week.  Encouraged patient and her practice  of positive behaviors including:  Believing in herself more in her ability to make changes and deal with difficult circumstances. Practice consistent positive self talk. Stay in the present focusing on what she can change or control versus cannot. Looking more for what might go right versus wrong. Taking  breaks from her electronics. Increase her patience when things do not go as planned or expected. Stay on her medications as prescribed. Find the positives within herself and feel good about them Stay in touch with supportive people. Interrupt anxious/fearful/depressive/negative thoughts; replace with more realistic thoughts. Remain in contact with her church friends. Stay involved in the various church groups that she has enjoyed. Get outside as she is able each day. Stop assuming worst case scenarios. Stop self negating. Pay attention to "how I say what I say. " Decrease overthinking and over analyzing. Reverse her tendency to always look for the negatives and second guess people/situations. Feel encouraged by the strengths she shows working with goal-directed behaviors moving in a direction that supports improved emotional health.  Goal review and progress/challenges noted with patient.  Next appointment within 2 to 3 weeks.  This record has been created using [employer].  Chart creation errors have been sought, but may not always have been located and corrected.  Such creation errors do not reflect on the standard of medical care provided.    Shanon Ace, LCSW

## 2021-08-06 ENCOUNTER — Ambulatory Visit: Payer: Medicare Other | Admitting: Podiatry

## 2021-08-06 ENCOUNTER — Other Ambulatory Visit: Payer: Self-pay

## 2021-08-06 ENCOUNTER — Ambulatory Visit: Payer: Medicare Other | Admitting: Internal Medicine

## 2021-08-06 DIAGNOSIS — M7752 Other enthesopathy of left foot: Secondary | ICD-10-CM

## 2021-08-09 ENCOUNTER — Encounter: Payer: Self-pay | Admitting: Psychiatry

## 2021-08-09 ENCOUNTER — Ambulatory Visit: Payer: Medicare Other | Admitting: Psychiatry

## 2021-08-09 ENCOUNTER — Other Ambulatory Visit: Payer: Self-pay

## 2021-08-09 VITALS — BP 151/89 | HR 99

## 2021-08-09 DIAGNOSIS — F319 Bipolar disorder, unspecified: Secondary | ICD-10-CM

## 2021-08-09 DIAGNOSIS — G3184 Mild cognitive impairment, so stated: Secondary | ICD-10-CM | POA: Diagnosis not present

## 2021-08-09 DIAGNOSIS — R251 Tremor, unspecified: Secondary | ICD-10-CM

## 2021-08-09 DIAGNOSIS — G2401 Drug induced subacute dyskinesia: Secondary | ICD-10-CM

## 2021-08-09 DIAGNOSIS — F411 Generalized anxiety disorder: Secondary | ICD-10-CM

## 2021-08-09 DIAGNOSIS — R7989 Other specified abnormal findings of blood chemistry: Secondary | ICD-10-CM

## 2021-08-09 DIAGNOSIS — Z636 Dependent relative needing care at home: Secondary | ICD-10-CM

## 2021-08-09 DIAGNOSIS — F900 Attention-deficit hyperactivity disorder, predominantly inattentive type: Secondary | ICD-10-CM | POA: Diagnosis not present

## 2021-08-09 MED ORDER — CHLORDIAZEPOXIDE HCL 10 MG PO CAPS: 10.0000 mg | ORAL_CAPSULE | Freq: Every evening | ORAL | 1 refills | Status: DC

## 2021-08-09 MED ORDER — ESCITALOPRAM OXALATE 10 MG PO TABS: ORAL_TABLET | ORAL | 1 refills | Status: DC

## 2021-08-09 NOTE — Progress Notes (Signed)
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 of GI blood loss   Anxiety    Arthritis    Atrophy of vagina 10/07/2020   Bipolar 1 disorder (HCC)    Cancer ([place])    Colon polyps    Depression    Diabetes mellitus ([place])    Edema, lower extremity    Epistaxis    Around 2011 or 2012, required cauterization.    Esophageal stricture    Fracture of superior pubic ramus (HCC) 11/28/2018   GERD (gastroesophageal reflux disease)    Headache(784.0)    Hyperlipidemia    Interstitial cystitis    Joint pain    Lactose intolerance    Lung cancer ([place]) 2002   Obesity    Osteoarthritis    Palpitations    Sleep apnea    Doesn't use a CPAP   Suicidal ideation 01/20/2020   Swallowing difficulty     Family History  Problem Relation Age of Onset   Arthritis Mother    Hearing loss Mother    Hyperlipidemia Mother    Hypertension Mother    Depression Mother    Anxiety disorder Mother    Obesity Mother    Sudden death Mother    Hypertension Father    Diabetes Mellitus II Father    Heart disease Father    Arthritis Father    Cancer Father         Brain   COPD Father    Diabetes Father    Hyperlipidemia Father    Sleep apnea Father    Early death Sister        Aneroxia/Bulimic   Depression Brother    Early death Proofreader Accident   Depression Daughter    Drug abuse Daughter    Heart disease Daughter    Hypertension Daughter    Stroke Maternal Grandmother    Hypertension Maternal Grandmother    Arthritis Maternal Grandfather    Heart attack Maternal Grandfather    Hearing loss Maternal Grandfather    Colon cancer Neg Hx    Esophageal cancer Neg Hx    Rectal cancer Neg Hx     Social History   Socioeconomic History   Marital status: Married    Spouse name: Not on file   Number of children: 1  Years of education: Not on file   Highest education level: Not on file  Occupational History   Occupation: Admin. assistant  Tobacco Use   Smoking status: Never   Smokeless tobacco: Never  Vaping Use   Vaping Use: Never used  Substance and Sexual Activity   Alcohol use: Yes    Alcohol/week: 1.0 standard drink    Types: 1 Glasses of wine per week    Comment: Moderate   Drug use: No   Sexual activity: Yes  Other Topics Concern   Not on file  Social History Narrative   Pt lives in [place] with husband Lanny Hurst.  Followed by Dr. Clovis Pu for psychiatry and Rinaldo Cloud for therapy.   Social Determinants of Health   Financial Resource Strain: Medium Risk   Difficulty of Paying Living Expenses: Somewhat hard  Food Insecurity: No Food Insecurity   Worried About Charity fundraiser in the Last Year: Never true   Ran Out of Food in the Last Year: Never true  Transportation Needs: No Transportation Needs   Lack of Transportation (Medical): No   Lack of Transportation (Non-Medical): No  Physical Activity: Inactive   Days of Exercise per Week: 0 days   Minutes of Exercise per Session: 0 min  Stress: No Stress Concern Present   Feeling of Stress : Only a little  Social Connections: Moderately Integrated    Frequency of Communication with Friends and Family: More than three times a week   Frequency of Social Gatherings with Friends and Family: More than three times a week   Attends Religious Services: More than 4 times per year   Active Member of [employer] or Organizations: No   Attends Archivist Meetings: Never   Marital Status: Married  Human resources officer Violence: Not At Risk   Fear of Current or Ex-Partner: No   Emotionally Abused: No   Physically Abused: No   Sexually Abused: No    Past Medical History, Surgical history, Social history, and Family history were reviewed and updated as appropriate.   Please see review of systems for further details on the patient's review from today.   Objective:   Physical Exam:  BP (!) 151/89   Pulse 99   Physical Exam Constitutional:      General: She is not in acute distress. Musculoskeletal:        General: No deformity.  Neurological:     Mental Status: She is alert and oriented to person, place, and time.     Cranial Nerves: No dysarthria.     Motor: Tremor present.     Coordination: Coordination normal.     Comments: Mild resting R hand rotational tremor Fidgety mildy with feet Very mild lip pursing.  Psychiatric:        Attention and Perception: Attention and perception normal. She does not perceive auditory or visual hallucinations.        Mood and Affect: Mood is anxious and depressed. Affect is not labile, blunt, angry or inappropriate.        Speech: Speech normal. Speech is not rapid and pressured.        Behavior: Behavior normal. Behavior is not agitated. Behavior is cooperative.        Thought Content: Thought content normal. Thought content is not paranoid or delusional. Thought content does not include homicidal or suicidal ideation. Thought content does not include homicidal or suicidal plan.        Cognition and Memory: Cognition and memory normal.  Judgment: Judgment normal.     Comments: Insight  intact Less hypomania but more depression.     Lab Review:     Component Value Date/Time   NA 139 01/28/2021 0926   K 4.6 01/28/2021 0926   CL 104 01/28/2021 0926   CO2 20 01/28/2021 0926   GLUCOSE 91 01/28/2021 0926   GLUCOSE 102 (H) 01/19/2020 1158   BUN 28 (H) 01/28/2021 0926   CREATININE 0.87 01/28/2021 0926   CALCIUM 9.1 01/28/2021 0926   PROT 6.4 01/28/2021 0926   ALBUMIN 4.0 01/28/2021 0926   AST 16 01/28/2021 0926   ALT 13 01/28/2021 0926   ALKPHOS 64 01/28/2021 0926   BILITOT 0.2 01/28/2021 0926   GFRNONAA 83 03/02/2020 1433   GFRAA 96 03/02/2020 1433       Component Value Date/Time   WBC 8.4 01/28/2021 0926   WBC 7.4 01/19/2020 1158   RBC 4.47 01/28/2021 0926   RBC 4.19 01/19/2020 1158   HGB 13.2 01/28/2021 0926   HCT 40.4 01/28/2021 0926   PLT 231 01/28/2021 0926   MCV 90 01/28/2021 0926   MCH 29.5 01/28/2021 0926   MCH 30.3 01/19/2020 1158   MCHC 32.7 01/28/2021 0926   MCHC 32.1 01/19/2020 1158   RDW 13.0 01/28/2021 0926   LYMPHSABS 3.5 (H) 01/28/2021 0926   MONOABS 0.7 04/04/2019 1004   EOSABS 0.3 01/28/2021 0926   BASOSABS 0.1 01/28/2021 0926  Vitamin D level 33 on 10K units daily on 12/4/`9 Increased to prescription vitamin d 50K units Monday, Wed, Friday.  Rx sent in.   Lithium Lvl  Date Value Ref Range Status  10/21/2018 0.18 (L) 0.60 - 1.20 mmol/L Final    Comment:    Performed at [facility], [address] [place], [place] [address]     No results found for: PHENYTOIN, PHENOBARB, VALPROATE, CBMZ   .res Assessment: Plan:    Bipolar I disorder with rapid cycling ([place])  Generalized anxiety disorder - Plan: escitalopram (LEXAPRO) 10 MG tablet, chlordiazePOXIDE (LIBRIUM) 10 MG capsule  Attention deficit hyperactivity disorder (ADHD), predominantly inattentive type  Mild cognitive impairment  Tremor of both hands  Tardive dyskinesia  Low vitamin D level  Caregiver stress  [place] has chronic rapid cycling bipolar disorder which  is chronically unstable and has been difficult to control.  The rapid cycling is making it difficult to control frequency of depressive episodes and the anxiety as well.  We have typically had to make frequent med changes.  We have had to reduce mood stabilizer dosages including Vraylar and Equetro she is very sensitive to carbamazepine because higher doses cause dizziness.    Since last visit cycled out of mania and into depression.  More cycling with less Vraylar.  No easy solution to this.  She's tried all FDA approved meds for bipolar depression.  Failed many other meds too. Since the last appointment she cycled into depression  Disc ECT. Only FDA approved option left. She was denie patient assistance with Vraylar.   She is more depressed at the moment but it is likely that the added stress with her husband is a contributing factor and unlikely that any immediate med change is going to help.  She is failed multiple prior meds for depression.    Only other option reasonable is ECT  Wear a mouthguard bc of teeth grinding.  It' smuch better off Cap[place].  Prone to UTI and may be causing confusion discussed.  Had confirmed UTI recently.  Since last  year when she was stable she started having hypomanic symptoms again.  We increased Equetro back to 300 mg nightly but she she has had balance problems. It had stopped the manic symptoms but then she started having balance problems again and we had to switch it back to Equetro 200 and add carbamazepine immediate release 100 nightly.Marland Kitchen  However now need to retry Equetro 300 mg HS DT hypomania.  Disc fall risk and be careful.  Hypomania , manic sx are serious bc caused dangerous driving.  After New Year switch Equetro to Carbatrol in hopes of better coverage.  Out of work ADD less of a problem.  Discussed the risk of stimulants including that that could contribute to mood instability and mood swings.    She has a high residual anxiety.  It has been  impossible to control all of her symptoms simultaneously without causing side effects. Failed various meds.  She agrees.  Continue the following Discussed side effects of each medicine. Continue   Focalin XR 20 mg  To IR 15 mg BID DT Cost and off label for depression  Try to spread this out if possible for mood.  Continue Equetro 300 mg nightly Carbamazepine immediate release 100 mg nightly Lamotrigine 200 mg twice daily Lithium 150 mg nightly Continue Librium 25 HS bc needed for sleep Vraylar 1.5 mg daily Using samples Disc risk worsening TD and tremor. Failed all reasonable alternatives for anxiety.  Trial low dose Lexapro 5-10 mg for anxeithy and depression. Option viibryd Reduce Librium to 10 mg HS DT ? Effect.    Discussed potential metabolic side effects associated with atypical antipsychotics, as well as potential risk for movement side effects. Advised pt to contact office if movement side effects occur.  She may be having some mild TD with toe movement and grimacing.  Disc this in detail.  At this point it's tolerable.  . Suspect it.  "I don't realize it". But sometimes others notice  Option reduce Librium but she has no hangover No additonal sedatives during the day which fight the benefit of the stimulants and be more likely to trigger depression and poor activitty level. We discussed the short-term risks associated with benzodiazepines including sedation and increased fall risk among others.  Discussed long-term side effect risk including dependence, potential withdrawal symptoms, and the potential eventual dose-related risk of dementia.  But recent studies from 2020 dispute this association between benzodiazepines and dementia risk. Newer studies in 2020 do not support an association with dementia.  Disc SE meds and this is heightened by the complication of necessary polypharmacy.  Supportive therapy in terms of dealing with husband's addiction and now new dx metastatic  prostate CA.Marland Kitchen Disc poss PT job to help with socialization.  Requires frequent FU DT chronic instability.  Wants to schedule monthly.  FU 4 weeks  Lynder Parents MD, DFAPA  Please see After Visit Summary for patient specific instructions.    Future Appointments  Date Time Provider [place]  08/17/2021  8:40 AM Lindell Spar, MD [facility] R[facility]  08/19/2021 10:00 AM Shanon Ace, LCSW CP-CP None  09/02/2021 10:00 AM Shanon Ace, LCSW CP-CP None  09/08/2021  2:00 PM Cottle, Billey Co., MD CP-CP None  09/16/2021 10:00 AM Shanon Ace, LCSW [facility] None  09/30/2021 10:00 AM Shanon Ace, LCSW CP[facility] None  10/06/2021  2:00 PM Cottle, Billey Co., MD CP-CP None  10/14/2021 10:00 AM Shanon Ace, LCSW [facility] None  11/03/2021  2:00 PM Cottle, Billey Co.,  MD CP-CP None    No orders of the defined types were placed in this encounter.     -------------------------------

## 2021-08-09 NOTE — Patient Instructions (Signed)
Start Lexapro also called S-Citalopram 1/2 tablet at night for 1 to 2 weeks and then 1 tablet at night  Reduce chlordiazepoxide or Librium to 10 mg at night

## 2021-08-10 ENCOUNTER — Telehealth: Payer: Self-pay | Admitting: Psychiatry

## 2021-08-10 NOTE — Telephone Encounter (Signed)
Reduce Equetro back to 200 mg HS from 300 mg HS

## 2021-08-10 NOTE — Telephone Encounter (Signed)
Pt called and said she met with Dr. Clovis Pu yesterday.  She expressed some side effects she thinks she is having from the [place].   She has been "shuffling" and almost fell today to the point she hurt her leg.  She would like someone to call her back.  Next appt 1/4

## 2021-08-10 NOTE — Telephone Encounter (Signed)
Rtc call to pt.Since the increase in equetro she has been very unsteady.She did end up falling today and hurting her knee.She wants to know what to do

## 2021-08-11 ENCOUNTER — Other Ambulatory Visit: Payer: Self-pay

## 2021-08-11 DIAGNOSIS — F319 Bipolar disorder, unspecified: Secondary | ICD-10-CM

## 2021-08-11 MED ORDER — CARBAMAZEPINE ER 200 MG PO CP12: 200.0000 mg | ORAL_CAPSULE | Freq: Every day | ORAL | 0 refills | Status: DC

## 2021-08-11 NOTE — Telephone Encounter (Signed)
Pt informed rx sent.

## 2021-08-12 DIAGNOSIS — F319 Bipolar disorder, unspecified: Secondary | ICD-10-CM | POA: Diagnosis not present

## 2021-08-12 DIAGNOSIS — E782 Mixed hyperlipidemia: Secondary | ICD-10-CM | POA: Diagnosis not present

## 2021-08-12 DIAGNOSIS — Z0000 Encounter for general adult medical examination without abnormal findings: Secondary | ICD-10-CM | POA: Diagnosis not present

## 2021-08-12 NOTE — Progress Notes (Signed)
Subjective:  Patient ID: Megan Whitney, female    DOB: [birth year],  MRN: [id number]  Chief Complaint  Patient presents with   Foot Pain    Left foot pain     65 y.o. female presents with the above complaint.  Patient presents with a complaint of left chronic ankle arthritis with underlying OCD lesion.  Patient states that she started hurting few weeks ago likely due to weather changes.  The injection helps however it seems like the interval is getting less and less.  She would like to do 1 last injection and then if no resolve then we will discuss options next year.   Review of Systems: Negative except as noted in the HPI. Denies N/V/F/Ch.  Past Medical History:  Diagnosis Date   ADD (attention deficit disorder)    Allergy    Seasonal   Anemia    History of GI blood loss   Anxiety    Arthritis    Atrophy of vagina 10/07/2020   Bipolar 1 disorder (HCC)    Cancer (HCC)    Colon polyps    Depression    Diabetes mellitus ([place])    Edema, lower extremity    Epistaxis    Around 2011 or 2012, required cauterization.    Esophageal stricture    Fracture of superior pubic ramus (HCC) 11/28/2018   GERD (gastroesophageal reflux disease)    Headache(784.0)    Hyperlipidemia    Interstitial cystitis    Joint pain    Lactose intolerance    Lung cancer ([place]) 2002   Obesity    Osteoarthritis    Palpitations    Sleep apnea    Doesn't use a CPAP   Suicidal ideation 01/20/2020   Swallowing difficulty     Current Outpatient Medications:    acetaminophen (TYLENOL) 650 MG CR tablet, Take 1,300 mg by mouth as needed for pain., Disp: , Rfl:    atorvastatin (LIPITOR) 20 MG tablet, TAKE (1) TABLET BY MOUTH AT BEDTIME., Disp: 90 tablet, Rfl: 0   CALCIUM PO, Take by mouth., Disp: , Rfl:    carbamazepine (EQUETRO) 200 MG CP12 12 hr capsule, Take 1 capsule (200 mg total) by mouth at bedtime., Disp: 30 capsule, Rfl: 0   carbamazepine (TEGRETOL) 100 MG chewable tablet, CHEW 1 TABLET BY MOUTH AT  BEDTIME., Disp: 30 tablet, Rfl: 0   cariprazine (VRAYLAR) 3 MG capsule, Take 1 capsule (3 mg total) by mouth daily. (Patient taking differently: Take 3 mg by mouth every other day.), Disp: 30 capsule, Rfl: 1   celecoxib (CELEBREX) 50 MG capsule, Take 1 capsule (50 mg total) by mouth 2 (two) times daily., Disp: 60 capsule, Rfl: 0   chlordiazePOXIDE (LIBRIUM) 10 MG capsule, Take 1 capsule (10 mg total) by mouth at bedtime., Disp: 30 capsule, Rfl: 1   cholecalciferol (VITAMIN D) 25 MCG (1000 UNIT) tablet, Take 1,000 Units by mouth daily., Disp: , Rfl:    dexmethylphenidate (FOCALIN) 10 MG tablet, TAKE 1 AND 1/2 TABLETS BY MOUTH TWICE DAILY., Disp: 90 tablet, Rfl: 0   EQUETRO 300 MG CP12, TAKE (1) CAPSULE BY MOUTH EVERY DAY., Disp: 30 capsule, Rfl: 0   escitalopram (LEXAPRO) 10 MG tablet, 1/2 tablet at night for 1 week, then 1 tablet at night, Disp: 30 tablet, Rfl: 1   ferrous gluconate (FERGON) 324 MG tablet, Take 324 mg by mouth daily with breakfast., Disp: , Rfl:    lamoTRIgine (LAMICTAL) 200 MG tablet, TAKE (1) TABLET BY MOUTH TWICE DAILY., Disp:  60 tablet, Rfl: 0   lithium carbonate 150 MG capsule, Take 1 capsule (150 mg total) by mouth at bedtime. 1 tab at bedtime (Patient taking differently: Take 150 mg by mouth at bedtime.), Disp: 90 capsule, Rfl: 1   Melatonin 10 MG CAPS, Take by mouth at bedtime as needed., Disp: , Rfl:    pantoprazole (PROTONIX) 40 MG tablet, TAKE (1) TABLET BY MOUTH ONCE DAILY., Disp: 90 tablet, Rfl: 0  Social History   Tobacco Use  Smoking Status Never  Smokeless Tobacco Never    Allergies  Allergen Reactions   Azithromycin Anaphylaxis   Penicillins Anaphylaxis    DID THE REACTION INVOLVE: Swelling of the face/tongue/throat, SOB, or low BP? Yes Sudden or severe rash/hives, skin peeling, or the inside of the mouth or nose? Yes Did it require medical treatment? No When did it last happen?       If all above answers are "NO", may proceed with cephalosporin use.    Adhesive [Tape] Other (See Comments)    On bandaids   Objective:  There were no vitals filed for this visit. There is no height or weight on file to calculate BMI. Constitutional Well developed. Well nourished.  Vascular Dorsalis pedis pulses palpable bilaterally. Posterior tibial pulses palpable bilaterally. Capillary refill normal to all digits.  No cyanosis or clubbing noted. Pedal hair growth normal.  Neurologic Normal speech. Oriented to person, place, and time. Epicritic sensation to light touch grossly present bilaterally.  Dermatologic Nails well groomed and normal in appearance. No open wounds. No skin lesions.  Orthopedic:  Pain on palpation to the medial lateral gutter of the ankle joint.  Pain with posterior ankle joint as well.  No pain at the peroneal tendon, posterior tibial tendon, Achilles tendon, ATFL.  Pain with range of motion of the ankle joint dorsiflexion as well as plantarflexion active and passive.   Radiographs: 3 views of skeletally mature adult left foot: No fractures noted.  No bony abnormality noted.  Osteoarthritic changes noted to the ankle joint.  No other bony abnormalities noted Assessment:   1. Capsulitis of ankle, left      Plan:  Patient was evaluated and treated and all questions answered.  Left foot injury -Clinically patient's pain seems to be more exacerbated after she had a fall accident.  Seems like the osteochondral lesion may have been more disrupted and causing a lot of pain in the ankle joint.  I believe she will benefit from injection as described below.  Left osteochondral lesion of the talar dome medial and lateral -Given that her pain is recurring every 3 months or so.  I do not mind continuing to do steroid injection if she tends to get about 3 to 4 months of relief.  Patient states understanding would like to proceed with a steroid injection. -Another steroid injection was performed at left ankle using 1% plain Lidocaine and 10  mg of Kenalog. This was well tolerated. -I will see her back again in 3 months for evaluation and management of the left chronic ankle pain.  I again we discussed with her the some of the surgical options that are available.  For now we will hold off on as she has familial commitments. -Continue using Celebrex  No follow-ups on file.

## 2021-08-13 ENCOUNTER — Other Ambulatory Visit: Payer: Self-pay | Admitting: Psychiatry

## 2021-08-13 DIAGNOSIS — F319 Bipolar disorder, unspecified: Secondary | ICD-10-CM

## 2021-08-13 LAB — CMP14+EGFR
ALT: 11 IU/L (ref 0–32)
AST: 19 IU/L (ref 0–40)
Albumin/Globulin Ratio: 1.8 (ref 1.2–2.2)
Albumin: 4.3 g/dL (ref 3.8–4.8)
Alkaline Phosphatase: 63 IU/L (ref 44–121)
BUN/Creatinine Ratio: 19 (ref 12–28)
BUN: 17 mg/dL (ref 8–27)
Bilirubin Total: 0.2 mg/dL (ref 0.0–1.2)
CO2: 24 mmol/L (ref 20–29)
Calcium: 9.5 mg/dL (ref 8.7–10.3)
Chloride: 101 mmol/L (ref 96–106)
Creatinine, Ser: 0.91 mg/dL (ref 0.57–1.00)
Globulin, Total: 2.4 g/dL (ref 1.5–4.5)
Glucose: 90 mg/dL (ref 70–99)
Potassium: 4.4 mmol/L (ref 3.5–5.2)
Sodium: 141 mmol/L (ref 134–144)
Total Protein: 6.7 g/dL (ref 6.0–8.5)
eGFR: 70 mL/min/{1.73_m2} (ref >59)

## 2021-08-13 LAB — CBC WITH DIFFERENTIAL/PLATELET
Basophils Absolute: 0 10*3/uL (ref 0.0–0.2)
Basos: 0 %
EOS (ABSOLUTE): 0.4 10*3/uL (ref 0.0–0.4)
Eos: 5 %
Hematocrit: 43.7 % (ref 34.0–46.6)
Hemoglobin: 14.2 g/dL (ref 11.1–15.9)
Immature Grans (Abs): 0 10*3/uL (ref 0.0–0.1)
Immature Granulocytes: 0 %
Lymphocytes Absolute: 2.8 10*3/uL (ref 0.7–3.1)
Lymphs: 38 %
MCH: 30.2 pg (ref 26.6–33.0)
MCHC: 32.5 g/dL (ref 31.5–35.7)
MCV: 93 fL (ref 79–97)
Monocytes Absolute: 0.7 10*3/uL (ref 0.1–0.9)
Monocytes: 10 %
Neutrophils Absolute: 3.4 10*3/uL (ref 1.4–7.0)
Neutrophils: 47 %
Platelets: 277 10*3/uL (ref 150–450)
RBC: 4.7 x10E6/uL (ref 3.77–5.28)
RDW: 12.1 % (ref 11.7–15.4)
WBC: 7.4 10*3/uL (ref 3.4–10.8)

## 2021-08-13 LAB — LIPID PANEL
Chol/HDL Ratio: 2.8 ratio (ref 0.0–4.4)
Cholesterol, Total: 219 mg/dL — ABNORMAL HIGH (ref 100–199)
HDL: 79 mg/dL (ref >39)
LDL Chol Calc (NIH): 120 mg/dL — ABNORMAL HIGH (ref 0–99)
Triglycerides: 113 mg/dL (ref 0–149)
VLDL Cholesterol Cal: 20 mg/dL (ref 5–40)

## 2021-08-14 ENCOUNTER — Other Ambulatory Visit: Payer: Self-pay | Admitting: Psychiatry

## 2021-08-14 DIAGNOSIS — F319 Bipolar disorder, unspecified: Secondary | ICD-10-CM

## 2021-08-16 ENCOUNTER — Telehealth: Payer: Self-pay | Admitting: Psychiatry

## 2021-08-16 ENCOUNTER — Other Ambulatory Visit: Payer: Self-pay | Admitting: Psychiatry

## 2021-08-16 DIAGNOSIS — F319 Bipolar disorder, unspecified: Secondary | ICD-10-CM

## 2021-08-16 NOTE — Telephone Encounter (Signed)
Pt called and advised her pharmacist said Dr Clovis Pu does NOT want her to take the Carbamazepine 100 mg chewable and instead the Carbamazepine 200 mg.  She wants to confirm that.  Also Escitalopram is prescribed for her to take at night.  It is causing her insomnia and she wants to know if she can take it in the morning.  Next appt 1/4

## 2021-08-16 NOTE — Telephone Encounter (Signed)
Please review

## 2021-08-17 ENCOUNTER — Ambulatory Visit (INDEPENDENT_AMBULATORY_CARE_PROVIDER_SITE_OTHER): Payer: Medicare Other | Admitting: Internal Medicine

## 2021-08-17 ENCOUNTER — Encounter: Payer: Self-pay | Admitting: Internal Medicine

## 2021-08-17 ENCOUNTER — Other Ambulatory Visit: Payer: Self-pay

## 2021-08-17 VITALS — BP 132/84 | HR 114 | Resp 16 | Ht 60.0 in | Wt 179.1 lb

## 2021-08-17 DIAGNOSIS — G4730 Sleep apnea, unspecified: Secondary | ICD-10-CM

## 2021-08-17 DIAGNOSIS — J029 Acute pharyngitis, unspecified: Secondary | ICD-10-CM

## 2021-08-17 DIAGNOSIS — F319 Bipolar disorder, unspecified: Secondary | ICD-10-CM

## 2021-08-17 DIAGNOSIS — M1710 Unilateral primary osteoarthritis, unspecified knee: Secondary | ICD-10-CM | POA: Insufficient documentation

## 2021-08-17 DIAGNOSIS — K219 Gastro-esophageal reflux disease without esophagitis: Secondary | ICD-10-CM

## 2021-08-17 DIAGNOSIS — Z0001 Encounter for general adult medical examination with abnormal findings: Secondary | ICD-10-CM

## 2021-08-17 DIAGNOSIS — E782 Mixed hyperlipidemia: Secondary | ICD-10-CM | POA: Diagnosis not present

## 2021-08-17 DIAGNOSIS — Z0000 Encounter for general adult medical examination without abnormal findings: Secondary | ICD-10-CM

## 2021-08-17 MED ORDER — CARBAMAZEPINE 100 MG PO CHEW: CHEWABLE_TABLET | ORAL | 0 refills | Status: DC

## 2021-08-17 NOTE — Telephone Encounter (Signed)
Clarified with pharmacy pt is suppose to take both the 100mg  tab and 200 mg capsule

## 2021-08-17 NOTE — Telephone Encounter (Signed)
Regarding the S-Citalopram, yes she can take it in the morning or any time of day she wishes.  Regarding the carbamazepine: She is on 100 mg tablet 1 at night which only comes as the chewable.  They want her to use the 200 mg tablet which means she would have to split that tablet.  As long as she can cut the 200 mg tablet it is fine for her to take one half of a 200 mg tablet instead of the chewable tablet.   However to be clear she cannot increase the carbamazepine to one of the 200 mg tablets at night.  That would be a dose increase and she does not tolerate that very well.

## 2021-08-17 NOTE — Assessment & Plan Note (Signed)
On Lithium, Vraylar, Tegretol, Lamictal, Librium, Equetro and Focalin Follows up with Psychiatry

## 2021-08-17 NOTE — Assessment & Plan Note (Signed)
On Atorvastatin Lipid profile reviewed

## 2021-08-17 NOTE — Progress Notes (Signed)
Established Patient Office Visit  Subjective:  Patient ID: Megan Whitney, female    DOB: 1956-04-10  Age: 65 y.o. MRN: [id number]  CC:  Chief Complaint  Patient presents with   Follow-up    6 month follow up HLD and bipolar pt woke up with sore throat and also left hip and knee has been giving out on her for about 2 weeks     HPI Megan Whitney is a 65 y.o. female with past medical history of bipolar disorder, ADD, anxiety, OSA, GERD, HLD and osteopenia who presents for f/u of her chronic medical conditions.  She c/o sore throat since this morning, but denies any cough, nasal congestion, fever, chills, dyspnea or wheezing currently.  Denies any recent sick contacts.  She complains of left knee and hip pain.  She has history of OA of hip, s/p b/l hip replacement in the past, done by Dr. Maureen Ralphs.  She feels as if her left hip and knee have been giving out and feels like falling at times.  She uses cane intermittently for walking support.  Bipolar disorder: She is on multiple medications for bipolar disorder and ADD. She follows up with Psychiatrist and has a therapist, who she visits every week.   Sleep apnea: She states that she does not want to use CPAP device. So she opted not to have sleep study.  Blood tests were reviewed and discussed with the patient in detail.    Past Medical History:  Diagnosis Date   ADD (attention deficit disorder)    Allergy    Seasonal   Anemia    History of GI blood loss   Anxiety    Arthritis    Atrophy of vagina 10/07/2020   Bipolar 1 disorder (HCC)    Cancer (HCC)    Colon polyps    Depression    Diabetes mellitus ([place])    Edema, lower extremity    Epistaxis    Around 2011 or 2012, required cauterization.    Esophageal stricture    Fracture of superior pubic ramus (HCC) 11/28/2018   GERD (gastroesophageal reflux disease)    Headache(784.0)    Hyperlipidemia    Interstitial cystitis    Joint pain    Lactose intolerance    Lung  cancer ([place]) 2002   Obesity    Osteoarthritis    Palpitations    Sleep apnea    Doesn't use a CPAP   Suicidal ideation 01/20/2020   Swallowing difficulty     Past Surgical History:  Procedure Laterality Date   BALLOON DILATION  05/16/2012   Procedure: BALLOON DILATION;  Surgeon: Inda Castle, MD;  Location: [place];  Service: Endoscopy;  Laterality: N/A;   BUNIONECTOMY  2011   COLONOSCOPY     ENTEROSCOPY  05/16/2012   Procedure: ENTEROSCOPY;  Surgeon: Inda Castle, MD;  Location: [facility];  Service: Endoscopy;  Laterality: N/A;   JOINT REPLACEMENT     right shoulder durgery 25 yrs ago  [place]  2006, 2008   bilateral   TUBAL LIGATION  1990   [place] RESECTION  2002   lung cancer    Family History  Problem Relation Age of Onset   Arthritis Mother    Hearing loss Mother    Hyperlipidemia Mother    Hypertension Mother    Depression Mother    Anxiety disorder Mother    Obesity Mother    Sudden death Mother    Hypertension Father  Diabetes Mellitus II Father    Heart disease Father    Arthritis Father    Cancer Father        Brain   COPD Father    Diabetes Father    Hyperlipidemia Father    Sleep apnea Father    Early death Sister        Aneroxia/Bulimic   Depression Brother    Early death Proofreader Accident   Depression Daughter    Drug abuse Daughter    Heart disease Daughter    Hypertension Daughter    Stroke Maternal Grandmother    Hypertension Maternal Grandmother    Arthritis Maternal Grandfather    Heart attack Maternal Grandfather    Hearing loss Maternal Grandfather    Colon cancer Neg Hx    Esophageal cancer Neg Hx    Rectal cancer Neg Hx     Social History   Socioeconomic History   Marital status: Married    Spouse name: Not on file   Number of children: 1   Years of education: Not on file   Highest education level: Not on file  Occupational History   Occupation: Admin. assistant  Tobacco Use    Smoking status: Never   Smokeless tobacco: Never  Vaping Use   Vaping Use: Never used  Substance and Sexual Activity   Alcohol use: Yes    Alcohol/week: 1.0 standard drink    Types: 1 Glasses of wine per week    Comment: Moderate   Drug use: No   Sexual activity: Yes  Other Topics Concern   Not on file  Social History Narrative   Pt lives in [place] with husband Lanny Hurst.  Followed by Dr. Clovis Pu for psychiatry and Rinaldo Cloud for therapy.   Social Determinants of Health   Financial Resource Strain: Medium Risk   Difficulty of Paying Living Expenses: Somewhat hard  Food Insecurity: No Food Insecurity   Worried About Charity fundraiser in the Last Year: Never true   Ran Out of Food in the Last Year: Never true  Transportation Needs: No Transportation Needs   Lack of Transportation (Medical): No   Lack of Transportation (Non-Medical): No  Physical Activity: Inactive   Days of Exercise per Week: 0 days   Minutes of Exercise per Session: 0 min  Stress: No Stress Concern Present   Feeling of Stress : Only a little  Social Connections: Moderately Integrated   Frequency of Communication with Friends and Family: More than three times a week   Frequency of Social Gatherings with Friends and Family: More than three times a week   Attends Religious Services: More than 4 times per year   Active Member of [employer] or Organizations: No   Attends Archivist Meetings: Never   Marital Status: Married  Human resources officer Violence: Not At Risk   Fear of Current or Ex-Partner: No   Emotionally Abused: No   Physically Abused: No   Sexually Abused: No    Outpatient Medications Prior to Visit  Medication Sig Dispense Refill   acetaminophen (TYLENOL) 650 MG CR tablet Take 1,300 mg by mouth as needed for pain.     atorvastatin (LIPITOR) 20 MG tablet TAKE (1) TABLET BY MOUTH AT BEDTIME. 90 tablet 0   CALCIUM PO Take by mouth.     carbamazepine (EQUETRO) 200 MG CP12 12 hr capsule Take  200 mg by mouth.     carbamazepine (TEGRETOL) 100 MG chewable  tablet CHEW 1 TABLET BY MOUTH AT BEDTIME. 30 tablet 0   cariprazine (VRAYLAR) 3 MG capsule Take 1 capsule (3 mg total) by mouth daily. (Patient taking differently: Take 3 mg by mouth every other day.) 30 capsule 1   celecoxib (CELEBREX) 50 MG capsule Take 1 capsule (50 mg total) by mouth 2 (two) times daily. 60 capsule 0   chlordiazePOXIDE (LIBRIUM) 10 MG capsule Take 1 capsule (10 mg total) by mouth at bedtime. 30 capsule 1   cholecalciferol (VITAMIN D) 25 MCG (1000 UNIT) tablet Take 1,000 Units by mouth daily.     dexmethylphenidate (FOCALIN) 10 MG tablet TAKE 1 AND 1/2 TABLETS BY MOUTH TWICE DAILY. 90 tablet 0   EQUETRO 300 MG CP12 TAKE (1) CAPSULE BY MOUTH EVERY DAY. 30 capsule 0   escitalopram (LEXAPRO) 10 MG tablet 1/2 tablet at night for 1 week, then 1 tablet at night 30 tablet 1   ferrous gluconate (FERGON) 324 MG tablet Take 324 mg by mouth daily with breakfast.     lamoTRIgine (LAMICTAL) 200 MG tablet TAKE (1) TABLET BY MOUTH TWICE DAILY. 60 tablet 0   lithium carbonate 150 MG capsule Take 1 capsule (150 mg total) by mouth at bedtime. 1 tab at bedtime (Patient taking differently: Take 150 mg by mouth at bedtime.) 90 capsule 1   Melatonin 10 MG CAPS Take by mouth at bedtime as needed.     pantoprazole (PROTONIX) 40 MG tablet TAKE (1) TABLET BY MOUTH ONCE DAILY. 90 tablet 0   carbamazepine (EQUETRO) 200 MG CP12 12 hr capsule Take 1 capsule (200 mg total) by mouth at bedtime. (Patient not taking: Reported on 08/17/2021) 30 capsule 0   No facility-administered medications prior to visit.    Allergies  Allergen Reactions   Azithromycin Anaphylaxis   Penicillins Anaphylaxis    DID THE REACTION INVOLVE: Swelling of the face/tongue/throat, SOB, or low BP? Yes Sudden or severe rash/hives, skin peeling, or the inside of the mouth or nose? Yes Did it require medical treatment? No When did it last happen?       If all above  answers are "NO", may proceed with cephalosporin use.   Adhesive [Tape] Other (See Comments)    On bandaids    ROS Review of Systems  Constitutional:  Negative for chills and fever.  HENT:  Negative for congestion, sinus pressure, sinus pain and sore throat.   Eyes:  Negative for pain and discharge.  Respiratory:  Negative for cough and shortness of breath.   Cardiovascular:  Negative for chest pain and palpitations.  Gastrointestinal:  Negative for abdominal pain, constipation, diarrhea, nausea and vomiting.  Endocrine: Negative for polydipsia and polyuria.  Genitourinary:  Negative for dysuria and hematuria.  Musculoskeletal:  Positive for arthralgias and gait problem. Negative for neck pain and neck stiffness.  Skin:  Negative for rash.  Neurological:  Negative for dizziness and weakness.  Psychiatric/Behavioral:  Negative for agitation, sleep disturbance and suicidal ideas. The patient is nervous/anxious.      Objective:    Physical Exam Vitals reviewed.  Constitutional:      General: She is not in acute distress.    Appearance: She is not diaphoretic.  HENT:     Head: Normocephalic and atraumatic.     Nose: Nose normal.     Mouth/Throat:     Mouth: Mucous membranes are moist.  Eyes:     General: No scleral icterus.    Extraocular Movements: Extraocular movements intact.  Cardiovascular:  Rate and Rhythm: Normal rate and regular rhythm.     Pulses: Normal pulses.     Heart sounds: Normal heart sounds. No murmur heard. Pulmonary:     Breath sounds: Normal breath sounds. No wheezing or rales.  Abdominal:     Palpations: Abdomen is soft.     Tenderness: There is no abdominal tenderness.  Musculoskeletal:     Cervical back: Neck supple. No tenderness.     Right lower leg: No edema.     Left lower leg: No edema.  Skin:    General: Skin is warm.     Findings: No rash.  Neurological:     General: No focal deficit present.     Mental Status: She is alert and  oriented to person, place, and time.     Sensory: No sensory deficit.     Motor: No weakness.     Gait: Gait abnormal.     Comments: Resting tremor of right hand  Psychiatric:        Behavior: Behavior normal.        Thought Content: Thought content normal.    BP 132/84 (BP Location: Left Arm, Patient Position: Sitting, Cuff Size: Normal)   Pulse (!) 114   Resp 16   Ht 5' (1.524 m)   Wt 179 lb 1.9 oz (81.2 kg)   SpO2 96%   BMI 34.98 kg/m  Wt Readings from Last 3 Encounters:  08/17/21 179 lb 1.9 oz (81.2 kg)  02/04/21 174 lb 6.4 oz (79.1 kg)  12/22/20 175 lb (79.4 kg)    Lab Results  Component Value Date   TSH 2.130 01/28/2021   Lab Results  Component Value Date   WBC 7.4 08/12/2021   HGB 14.2 08/12/2021   HCT 43.7 08/12/2021   MCV 93 08/12/2021   PLT 277 08/12/2021   Lab Results  Component Value Date   NA 141 08/12/2021   K 4.4 08/12/2021   CO2 24 08/12/2021   GLUCOSE 90 08/12/2021   BUN 17 08/12/2021   CREATININE 0.91 08/12/2021   BILITOT 0.2 08/12/2021   ALKPHOS 63 08/12/2021   AST 19 08/12/2021   ALT 11 08/12/2021   PROT 6.7 08/12/2021   ALBUMIN 4.3 08/12/2021   CALCIUM 9.5 08/12/2021   ANIONGAP 8 01/19/2020   EGFR 70 08/12/2021   GFR 69.31 04/04/2019   Lab Results  Component Value Date   CHOL 219 (H) 08/12/2021   Lab Results  Component Value Date   HDL 79 08/12/2021   Lab Results  Component Value Date   LDLCALC 120 (H) 08/12/2021   Lab Results  Component Value Date   TRIG 113 08/12/2021   Lab Results  Component Value Date   CHOLHDL 2.8 08/12/2021   Lab Results  Component Value Date   HGBA1C 5.5 01/28/2021      Assessment & Plan:   Problem List Items Addressed This Visit       Respiratory   Sleep apnea    Had referred to Neurology for sleep study Denies using CPAP, opted not to have sleep study        Musculoskeletal and Integument   Arthritis of knee    Check x-ray of knee Has history of OA of hip, s/p b/l hip  replacement Tylenol as needed May need orthopedic referral      Relevant Orders   DG Knee Complete 4 Views Left     Other   Bipolar disorder ([place])    On Lithium, Vraylar,  Tegretol, Lamictal, Librium, Equetro and Focalin Follows up with Psychiatry      Hyperlipidemia - Primary    On Atorvastatin Lipid profile reviewed      Relevant Orders   Lipid panel   Sore throat Likely due to allergy component versus due to dry/hot air Advised to perform salt/warm water gargling Advised to use humidifier or vaporizer.   No orders of the defined types were placed in this encounter.   Follow-up: Return in about 6 months (around 02/15/2022) for Annual physical.    Lindell Spar, MD

## 2021-08-17 NOTE — Assessment & Plan Note (Signed)
Well-controlled, on Pantoprazole

## 2021-08-17 NOTE — Patient Instructions (Signed)
Please continue taking medications as prescribed.  Please follow low cholesterol diet and ambulate as tolerated.

## 2021-08-17 NOTE — Assessment & Plan Note (Signed)
Check x-ray of knee Has history of OA of hip, s/p b/l hip replacement Tylenol as needed May need orthopedic referral

## 2021-08-17 NOTE — Assessment & Plan Note (Signed)
Had referred to Neurology for sleep study Denies using CPAP, opted not to have sleep study

## 2021-08-18 ENCOUNTER — Encounter: Payer: Self-pay | Admitting: Internal Medicine

## 2021-08-19 ENCOUNTER — Ambulatory Visit (INDEPENDENT_AMBULATORY_CARE_PROVIDER_SITE_OTHER): Payer: Medicare Other | Admitting: Internal Medicine

## 2021-08-19 ENCOUNTER — Other Ambulatory Visit: Payer: Self-pay

## 2021-08-19 ENCOUNTER — Encounter: Payer: Self-pay | Admitting: Internal Medicine

## 2021-08-19 ENCOUNTER — Ambulatory Visit (INDEPENDENT_AMBULATORY_CARE_PROVIDER_SITE_OTHER): Payer: Medicare Other

## 2021-08-19 ENCOUNTER — Ambulatory Visit: Payer: Medicare Other | Admitting: Psychiatry

## 2021-08-19 DIAGNOSIS — Z20822 Contact with and (suspected) exposure to covid-19: Secondary | ICD-10-CM | POA: Diagnosis not present

## 2021-08-19 DIAGNOSIS — J069 Acute upper respiratory infection, unspecified: Secondary | ICD-10-CM

## 2021-08-19 DIAGNOSIS — R6889 Other general symptoms and signs: Secondary | ICD-10-CM | POA: Diagnosis not present

## 2021-08-19 LAB — POCT INFLUENZA A/B
Influenza A, POC: NEGATIVE
Influenza B, POC: NEGATIVE

## 2021-08-19 NOTE — Progress Notes (Signed)
Virtual Visit via Telephone Note   This visit type was conducted due to national recommendations for restrictions regarding the COVID-19 Pandemic (e.g. social distancing) in an effort to limit this patient's exposure and mitigate transmission in our community.  Due to her co-morbid illnesses, this patient is at least at moderate risk for complications without adequate follow up.  This format is felt to be most appropriate for this patient at this time.  The patient did not have access to video technology/had technical difficulties with video requiring transitioning to audio format only (telephone).  All issues noted in this document were discussed and addressed.  No physical exam could be performed with this format.  Evaluation Performed:  Follow-up visit  Date:  08/19/2021   ID:  Megan Whitney, Megan Whitney 03/01/1956, MRN [id number]  Patient Location: Home Provider Location: Office/Clinic  Participants: Patient Location of Patient: Home Location of Provider: Telehealth Consent was obtain for visit to be over via telehealth. I verified that I am speaking with the correct person using two identifiers.  PCP:  Lindell Spar, MD   Chief Complaint: Cough, sore throat, headache and fever  History of Present Illness:    Megan Whitney is a 65 y.o. female who has a televisit for c/o cough, sore throat, headache and fever for last 2 days.  She has tried taking NyQuil cold and flu with some relief.  She has a COVID test scheduled today.  Denies any recent sick contacts.  Denies any dyspnea or wheezing currently.  She has had COVID and flu vaccines.  The patient does have symptoms concerning for COVID-19 infection (fever, chills, cough, or new shortness of breath).   Past Medical, Surgical, Social History, Allergies, and Medications have been Reviewed.  Past Medical History:  Diagnosis Date   ADD (attention deficit disorder)    Allergy    Seasonal   Anemia    History of GI blood loss    Anxiety    Arthritis    Atrophy of vagina 10/07/2020   Bipolar 1 disorder (HCC)    Cancer (HCC)    Colon polyps    Depression    Diabetes mellitus ([place])    Edema, lower extremity    Epistaxis    Around 2011 or 2012, required cauterization.    Esophageal stricture    Fracture of superior pubic ramus (HCC) 11/28/2018   GERD (gastroesophageal reflux disease)    Headache(784.0)    Hyperlipidemia    Interstitial cystitis    Joint pain    Lactose intolerance    Lung cancer ([place]) 2002   Obesity    Osteoarthritis    Palpitations    Sleep apnea    Doesn't use a CPAP   Suicidal ideation 01/20/2020   Swallowing difficulty    Past Surgical History:  Procedure Laterality Date   BALLOON DILATION  05/16/2012   Procedure: BALLOON DILATION;  Surgeon: Inda Castle, MD;  Location: [facility];  Service: Endoscopy;  Laterality: N/A;   BUNIONECTOMY  2011   COLONOSCOPY     ENTEROSCOPY  05/16/2012   Procedure: ENTEROSCOPY;  Surgeon: Inda Castle, MD;  Location: [facility];  Service: Endoscopy;  Laterality: N/A;   JOINT REPLACEMENT     right shoulder durgery 25 yrs ago  [place]  2006, 2008   bilateral   [place] RESECTION  2002   lung cancer     Current Meds  Medication  Sig   acetaminophen (TYLENOL) 650 MG CR tablet Take 1,300 mg by mouth as needed for pain.   atorvastatin (LIPITOR) 20 MG tablet TAKE (1) TABLET BY MOUTH AT BEDTIME.   CALCIUM PO Take by mouth.   carbamazepine (EQUETRO) 200 MG CP12 12 hr capsule Take 200 mg by mouth.   carbamazepine (TEGRETOL) 100 MG chewable tablet CHEW 1 TABLET BY MOUTH AT BEDTIME.   cariprazine (VRAYLAR) 3 MG capsule Take 1 capsule (3 mg total) by mouth daily. (Patient taking differently: Take 3 mg by mouth every other day.)   celecoxib (CELEBREX) 50 MG capsule Take 1 capsule (50 mg total) by mouth 2 (two) times daily.   chlordiazePOXIDE (LIBRIUM) 10 MG capsule Take 1 capsule (10 mg total) by mouth at  bedtime.   cholecalciferol (VITAMIN D) 25 MCG (1000 UNIT) tablet Take 1,000 Units by mouth daily.   dexmethylphenidate (FOCALIN) 10 MG tablet TAKE 1 AND 1/2 TABLETS BY MOUTH TWICE DAILY.   EQUETRO 300 MG CP12 TAKE (1) CAPSULE BY MOUTH EVERY DAY.   escitalopram (LEXAPRO) 10 MG tablet 1/2 tablet at night for 1 week, then 1 tablet at night   ferrous gluconate (FERGON) 324 MG tablet Take 324 mg by mouth daily with breakfast.   lamoTRIgine (LAMICTAL) 200 MG tablet TAKE (1) TABLET BY MOUTH TWICE DAILY.   lithium carbonate 150 MG capsule Take 1 capsule (150 mg total) by mouth at bedtime. 1 tab at bedtime (Patient taking differently: Take 150 mg by mouth at bedtime.)   Melatonin 10 MG CAPS Take by mouth at bedtime as needed.   pantoprazole (PROTONIX) 40 MG tablet TAKE (1) TABLET BY MOUTH ONCE DAILY.     Allergies:   Azithromycin, Penicillins, and Adhesive [tape]   ROS:   Please see the history of present illness.     All other systems reviewed and are negative.   Labs/Other Tests and Data Reviewed:    Recent Labs: 01/28/2021: TSH 2.130 08/12/2021: ALT 11; BUN 17; Creatinine, Ser 0.91; Hemoglobin 14.2; Platelets 277; Potassium 4.4; Sodium 141   Recent Lipid Panel Lab Results  Component Value Date/Time   CHOL 219 (H) 08/12/2021 09:11 AM   TRIG 113 08/12/2021 09:11 AM   HDL 79 08/12/2021 09:11 AM   CHOLHDL 2.8 08/12/2021 09:11 AM   CHOLHDL 3 07/22/2020 01:38 PM   LDLCALC 120 (H) 08/12/2021 09:11 AM    Wt Readings from Last 3 Encounters:  08/17/21 179 lb 1.9 oz (81.2 kg)  02/04/21 174 lb 6.4 oz (79.1 kg)  12/22/20 175 lb (79.4 kg)     ASSESSMENT & PLAN:    URTI Suspected COVID-19 infection Check COVID RT-PCR and rapid flu test Continue symptomatic treatment with DayQuil/NyQuil for now If persistent symptoms despite symptomatic treatment and negative COVID and flu test, will start antibiotic  Time:   Today, I have spent 9 minutes reviewing the chart, including problem list,  medications, and with the patient with telehealth technology discussing the above problems.   Medication Adjustments/Labs and Tests Ordered: Current medicines are reviewed at length with the patient today.  Concerns regarding medicines are outlined above.   Tests Ordered: No orders of the defined types were placed in this encounter.   Medication Changes: No orders of the defined types were placed in this encounter.    Note: This dictation was prepared with Dragon dictation along with smaller phrase technology. Similar sounding words can be transcribed inadequately or may not be corrected upon review. Any transcriptional errors that result from this  process are unintentional.      Disposition:  Follow up  Signed, Lindell Spar, MD  08/19/2021 9:22 AM     [facility]

## 2021-08-21 ENCOUNTER — Ambulatory Visit
Admission: EM | Admit: 2021-08-21 | Discharge: 2021-08-21 | Disposition: A | Discharge status: Home / Self Care | Payer: Medicare Other | Attending: Family Medicine | Admitting: Family Medicine

## 2021-08-21 ENCOUNTER — Other Ambulatory Visit: Payer: Self-pay

## 2021-08-21 DIAGNOSIS — J0100 Acute maxillary sinusitis, unspecified: Secondary | ICD-10-CM | POA: Diagnosis not present

## 2021-08-21 DIAGNOSIS — R051 Acute cough: Secondary | ICD-10-CM

## 2021-08-21 LAB — POCT RAPID STREP A (OFFICE): Rapid Strep A Screen: NEGATIVE

## 2021-08-21 MED ORDER — DOXYCYCLINE HYCLATE 100 MG PO CAPS: 100.0000 mg | ORAL_CAPSULE | Freq: Two times a day (BID) | ORAL | 0 refills | Status: DC

## 2021-08-21 MED ORDER — PROMETHAZINE-DM 6.25-15 MG/5ML PO SYRP: 5.0000 mL | ORAL_SOLUTION | Freq: Four times a day (QID) | ORAL | 0 refills | Status: DC | PRN

## 2021-08-21 NOTE — ED Provider Notes (Signed)
[facility]    CSN: [id number] Arrival date & time: 08/21/21  2025      History   Chief Complaint Chief Complaint  Patient presents with   Cough   Sore Throat   Nasal Congestion    HPI Megan Whitney is a 65 y.o. female.   Presenting today with 1 week history of sore throat, congestion, hacking cough, fatigue.  States that symptoms seem to be getting better but then yesterday got acutely worse and she is now having pain and pressure, worsening thick congestion with blood-streaked sputum, crackly cough at night.  Has been taking over-the-counter cold and congestion medications with minimal relief.  Was seen earlier this week by her PCP and tested negative for COVID and flu.  Denies known history of pulmonary disease.   Past Medical History:  Diagnosis Date   ADD (attention deficit disorder)    Allergy    Seasonal   Anemia    History of GI blood loss   Anxiety    Arthritis    Atrophy of vagina 10/07/2020   Bipolar 1 disorder (HCC)    Cancer (HCC)    Colon polyps    Depression    Diabetes mellitus ([place])    Edema, lower extremity    Epistaxis    Around 2011 or 2012, required cauterization.    Esophageal stricture    Fracture of superior pubic ramus (HCC) 11/28/2018   GERD (gastroesophageal reflux disease)    Headache(784.0)    Hyperlipidemia    Interstitial cystitis    Joint pain    Lactose intolerance    Lung cancer ([place]) 2002   Obesity    Osteoarthritis    Palpitations    Sleep apnea    Doesn't use a CPAP   Suicidal ideation 01/20/2020   Swallowing difficulty     Patient Active Problem List   Diagnosis Date Noted   Arthritis of knee 08/17/2021   Polyp of colon 02/04/2021   Annual physical exam 02/04/2021   Dyskinesia 11/22/2020   Parkinsonism ([place]) 11/22/2020   Seasonal allergies 10/07/2020   Osteopenia 10/07/2020   Gait disturbance 10/07/2020   Hyperlipidemia 06/11/2020   Sleep apnea 06/11/2020   Stricture and stenosis of esophagus  05/16/2012   Hiatal hernia 05/16/2012   Dysphagia, unspecified(787.20) 05/15/2012   Depression    Bipolar disorder ([place])    GERD (gastroesophageal reflux disease) 09/14/2010   Personal history of colonic polyps 09/14/2010    Past Surgical History:  Procedure Laterality Date   BALLOON DILATION  05/16/2012   Procedure: BALLOON DILATION;  Surgeon: Inda Castle, MD;  Location: [place];  Service: Endoscopy;  Laterality: N/A;   BUNIONECTOMY  2011   COLONOSCOPY     ENTEROSCOPY  05/16/2012   Procedure: ENTEROSCOPY;  Surgeon: Inda Castle, MD;  Location: [place];  Service: Endoscopy;  Laterality: N/A;   JOINT REPLACEMENT     right shoulder durgery 25 yrs ago  [place]  2006, 2008   bilateral   [place] RESECTION  2002   lung cancer    OB History     Gravida  1   Para      Term      Preterm      AB      Living         SAB      IAB      Ectopic      Multiple  Live Births               Home Medications    Prior to Admission medications   Medication Sig Start Date End Date Taking? Authorizing Provider  doxycycline (VIBRAMYCIN) 100 MG capsule Take 1 capsule (100 mg total) by mouth 2 (two) times daily. 08/21/21  Yes Volney American, PA-C  promethazine-dextromethorphan (PROMETHAZINE-DM) 6.25-15 MG/5ML syrup Take 5 mLs by mouth 4 (four) times daily as needed. 08/21/21  Yes Volney American, PA-C  acetaminophen (TYLENOL) 650 MG CR tablet Take 1,300 mg by mouth as needed for pain.    [provider]  atorvastatin (LIPITOR) 20 MG tablet TAKE (1) TABLET BY MOUTH AT BEDTIME. 07/15/21   Lindell Spar, MD  CALCIUM PO Take by mouth.    [provider]  carbamazepine (EQUETRO) 200 MG CP12 12 hr capsule Take 200 mg by mouth.    [provider]  carbamazepine (TEGRETOL) 100 MG chewable tablet CHEW 1 TABLET BY MOUTH AT BEDTIME. 08/17/21   Cottle, Billey Co., MD  cariprazine  (VRAYLAR) 3 MG capsule Take 1 capsule (3 mg total) by mouth daily. Patient taking differently: Take 3 mg by mouth every other day. 02/24/21   Cottle, Billey Co., MD  celecoxib (CELEBREX) 50 MG capsule Take 1 capsule (50 mg total) by mouth 2 (two) times daily. Patient not taking: Reported on 08/21/2021 12/03/20   Felipa Furnace, DPM  chlordiazePOXIDE (LIBRIUM) 10 MG capsule Take 1 capsule (10 mg total) by mouth at bedtime. 08/09/21   Cottle, Billey Co., MD  cholecalciferol (VITAMIN D) 25 MCG (1000 UNIT) tablet Take 1,000 Units by mouth daily.    [provider]  dexmethylphenidate (FOCALIN) 10 MG tablet TAKE 1 AND 1/2 TABLETS BY MOUTH TWICE DAILY. 07/23/21   Cottle, Billey Co., MD  EQUETRO 300 MG CP12 TAKE (1) CAPSULE BY MOUTH EVERY DAY. Patient not taking: Reported on 08/21/2021 07/15/21   Purnell Shoemaker., MD  escitalopram (LEXAPRO) 10 MG tablet 1/2 tablet at night for 1 week, then 1 tablet at night 08/09/21   Cottle, Billey Co., MD  ferrous gluconate (FERGON) 324 MG tablet Take 324 mg by mouth daily with breakfast.    [provider]  lamoTRIgine (LAMICTAL) 200 MG tablet TAKE (1) TABLET BY MOUTH TWICE DAILY. 08/13/21   Cottle, Billey Co., MD  lithium carbonate 150 MG capsule Take 1 capsule (150 mg total) by mouth at bedtime. 1 tab at bedtime Patient taking differently: Take 150 mg by mouth at bedtime. 05/12/21   Cottle, Billey Co., MD  Melatonin 10 MG CAPS Take by mouth at bedtime as needed.    [provider]  pantoprazole (PROTONIX) 40 MG tablet TAKE (1) TABLET BY MOUTH ONCE DAILY. 07/15/21   Lindell Spar, MD    Family History Family History  Problem Relation Age of Onset   Arthritis Mother    Hearing loss Mother    Hyperlipidemia Mother    Hypertension Mother    Depression Mother    Anxiety disorder Mother    Obesity Mother    Sudden death Mother    Hypertension Father    Diabetes Mellitus II Father    Heart disease Father    Arthritis Father     Cancer Father        Brain   COPD Father    Diabetes Father    Hyperlipidemia Father    Sleep apnea Father  Early death Sister        Aneroxia/Bulimic   Depression Brother    Early death Proofreader Accident   Depression Daughter    Drug abuse Daughter    Heart disease Daughter    Hypertension Daughter    Stroke Maternal Grandmother    Hypertension Maternal Grandmother    Arthritis Maternal Grandfather    Heart attack Maternal Grandfather    Hearing loss Maternal Grandfather    Colon cancer Neg Hx    Esophageal cancer Neg Hx    Rectal cancer Neg Hx     Social History Social History   Tobacco Use   Smoking status: Never   Smokeless tobacco: Never  Vaping Use   Vaping Use: Never used  Substance Use Topics   Alcohol use: Yes    Alcohol/week: 1.0 standard drink    Types: 1 Glasses of wine per week    Comment: Moderate   Drug use: No     Allergies   Azithromycin, Penicillins, and Adhesive [tape]   Review of Systems Review of Systems Per HPI  Physical Exam Triage Vital Signs ED Triage Vitals  Enc Vitals Group     BP 08/21/21 0930 136/81     Pulse Rate 08/21/21 0930 84     Resp 08/21/21 0930 14     Temp 08/21/21 0930 99.2 F (37.3 C)     Temp Source 08/21/21 0930 Oral     SpO2 08/21/21 0930 95 %     Weight --      Height --      Head Circumference --      Peak Flow --      Pain Score 08/21/21 0932 4     Pain Loc --      Pain Edu? --      Excl. in [place]? --    No data found.  Updated Vital Signs BP 136/81 (BP Location: Right Arm)    Pulse 84    Temp 99.2 F (37.3 C) (Oral)    Resp 14    SpO2 95%   Visual Acuity Right Eye Distance:   Left Eye Distance:   Bilateral Distance:    Right Eye Near:   Left Eye Near:    Bilateral Near:     Physical Exam Vitals and nursing note reviewed.  Constitutional:      Appearance: Normal appearance.  HENT:     Head: Atraumatic.     Right Ear: Tympanic membrane and external ear normal.     Left  Ear: Tympanic membrane and external ear normal.     Nose: Congestion present.     Mouth/Throat:     Mouth: Mucous membranes are moist.     Pharynx: Posterior oropharyngeal erythema present.  Eyes:     Extraocular Movements: Extraocular movements intact.     Conjunctiva/sclera: Conjunctivae normal.  Cardiovascular:     Rate and Rhythm: Normal rate and regular rhythm.     Heart sounds: Normal heart sounds.  Pulmonary:     Effort: Pulmonary effort is normal.     Breath sounds: Normal breath sounds. No wheezing or rales.  Musculoskeletal:        General: Normal range of motion.     Cervical back: Normal range of motion and neck supple.  Skin:    General: Skin is warm and dry.  Neurological:     Mental Status: She is alert and oriented to person, place, and time.  Psychiatric:        Mood and Affect: Mood normal.        Thought Content: Thought content normal.     UC Treatments / Results  Labs (all labs ordered are listed, but only abnormal results are displayed) Labs Reviewed  POCT RAPID STREP A (OFFICE)    EKG   Radiology No results found.  Procedures Procedures (including critical care time)  Medications Ordered in UC Medications - No data to display  Initial Impression / Assessment and Plan / UC Course  I have reviewed the triage vital signs and the nursing notes.  Pertinent labs & imaging results that were available during my care of the patient were reviewed by me and considered in my medical decision making (see chart for details).     Given duration and worsening course, will treat for sinusitis with doxycycline, Phenergan DM, supportive over-the-counter medications and home care.  Return for acutely worsening symptoms.  Final Clinical Impressions(s) / UC Diagnoses   Final diagnoses:  Acute non-recurrent maxillary sinusitis  Acute cough   Discharge Instructions   None    ED Prescriptions     Medication Sig Dispense Auth. Provider   doxycycline  (VIBRAMYCIN) 100 MG capsule Take 1 capsule (100 mg total) by mouth 2 (two) times daily. 14 capsule Volney American, [place]   promethazine-dextromethorphan (PROMETHAZINE-DM) 6.25-15 MG/5ML syrup Take 5 mLs by mouth 4 (four) times daily as needed. 100 mL Volney American, [place]      PDMP not reviewed this encounter.   Volney American, [place] 08/21/21 1149

## 2021-08-21 NOTE — ED Triage Notes (Signed)
Pt presents with cough, sore throat and congestion that began on Monday. Pt was seen and had a negative covid and flu test.

## 2021-09-01 ENCOUNTER — Other Ambulatory Visit: Payer: Self-pay

## 2021-09-01 ENCOUNTER — Encounter: Payer: Self-pay | Admitting: Internal Medicine

## 2021-09-01 ENCOUNTER — Ambulatory Visit (INDEPENDENT_AMBULATORY_CARE_PROVIDER_SITE_OTHER): Payer: Medicare Other | Admitting: Internal Medicine

## 2021-09-01 VITALS — BP 122/84 | HR 90 | Resp 18 | Ht 60.0 in | Wt 180.1 lb

## 2021-09-01 DIAGNOSIS — H6501 Acute serous otitis media, right ear: Secondary | ICD-10-CM

## 2021-09-01 MED ORDER — AMOXICILLIN-POT CLAVULANATE 875-125 MG PO TABS: 1.0000 | ORAL_TABLET | Freq: Two times a day (BID) | ORAL | 0 refills | Status: DC

## 2021-09-01 NOTE — Progress Notes (Signed)
Acute Office Visit  Subjective:    Patient ID: Megan Whitney, female    DOB: July 05, 1956, 65 y.o.   MRN: [id number]  Chief Complaint  Patient presents with   Sinus Problem    Pt started 08-19-21 has been on antibiotic still having right ear pain sore throat and cough     HPI Patient is in today for c/o right ear pain/fullness along with sinus pressure, postnasal drip and nasal congestion for about 2 weeks.  She has completed doxycycline for acute sinusitis, prescribed by urgent care.  Her nasal congestion has improved, but her right ear pain has been getting worse.  Denies any ear discharge.  Denies any tinnitus or dizziness.  She denies any fever, chills, dyspnea or wheezing currently.  Past Medical History:  Diagnosis Date   ADD (attention deficit disorder)    Allergy    Seasonal   Anemia    History of GI blood loss   Anxiety    Arthritis    Atrophy of vagina 10/07/2020   Bipolar 1 disorder (HCC)    Cancer (HCC)    Colon polyps    Depression    Diabetes mellitus ([place])    Edema, lower extremity    Epistaxis    Around 2011 or 2012, required cauterization.    Esophageal stricture    Fracture of superior pubic ramus (HCC) 11/28/2018   GERD (gastroesophageal reflux disease)    Headache(784.0)    Hyperlipidemia    Interstitial cystitis    Joint pain    Lactose intolerance    Lung cancer ([place]) 2002   Obesity    Osteoarthritis    Palpitations    Sleep apnea    Doesn't use a CPAP   Suicidal ideation 01/20/2020   Swallowing difficulty     Past Surgical History:  Procedure Laterality Date   BALLOON DILATION  05/16/2012   Procedure: BALLOON DILATION;  Surgeon: Inda Castle, MD;  Location: [facility];  Service: Endoscopy;  Laterality: N/A;   BUNIONECTOMY  2011   COLONOSCOPY     ENTEROSCOPY  05/16/2012   Procedure: ENTEROSCOPY;  Surgeon: Inda Castle, MD;  Location: [facility];  Service: Endoscopy;  Laterality: N/A;   JOINT REPLACEMENT     right shoulder  durgery 36 yrs ago  [place]  2006, 2008   bilateral   [place]  2002   lung cancer    Family History  Problem Relation Age of Onset   Arthritis Mother    Hearing loss Mother    Hyperlipidemia Mother    Hypertension Mother    Depression Mother    Anxiety disorder Mother    Obesity Mother    Sudden death Mother    Hypertension Father    Diabetes Mellitus II Father    Heart disease Father    Arthritis Father    Cancer Father        Brain   COPD Father    Diabetes Father    Hyperlipidemia Father    Sleep apnea Father    Early death Sister        Aneroxia/Bulimic   Depression Brother    Early death Proofreader Accident   Depression Daughter    Drug abuse Daughter    Heart disease Daughter    Hypertension Daughter    Stroke Maternal Grandmother    Hypertension Maternal Grandmother  Arthritis Maternal Grandfather    Heart attack Maternal Grandfather    Hearing loss Maternal Grandfather    Colon cancer Neg Hx    Esophageal cancer Neg Hx    Rectal cancer Neg Hx     Social History   Socioeconomic History   Marital status: Married    Spouse name: Not on file   Number of children: 1   Years of education: Not on file   Highest education level: Not on file  Occupational History   Occupation: Admin. assistant  Tobacco Use   Smoking status: Never   Smokeless tobacco: Never  Vaping Use   Vaping Use: Never used  Substance and Sexual Activity   Alcohol use: Yes    Alcohol/week: 1.0 standard drink    Types: 1 Glasses of wine per week    Comment: Moderate   Drug use: No   Sexual activity: Yes  Other Topics Concern   Not on file  Social History Narrative   Pt lives in [place] with husband Lanny Hurst.  Followed by Dr. Clovis Pu for psychiatry and Rinaldo Cloud for therapy.   Social Determinants of Health   Financial Resource Strain: Medium Risk   Difficulty of Paying Living Expenses: Somewhat hard  Food  Insecurity: No Food Insecurity   Worried About Charity fundraiser in the Last Year: Never true   Ran Out of Food in the Last Year: Never true  Transportation Needs: No Transportation Needs   Lack of Transportation (Medical): No   Lack of Transportation (Non-Medical): No  Physical Activity: Inactive   Days of Exercise per Week: 0 days   Minutes of Exercise per Session: 0 min  Stress: No Stress Concern Present   Feeling of Stress : Only a little  Social Connections: Moderately Integrated   Frequency of Communication with Friends and Family: More than three times a week   Frequency of Social Gatherings with Friends and Family: More than three times a week   Attends Religious Services: More than 4 times per year   Active Member of [employer] or Organizations: No   Attends Archivist Meetings: Never   Marital Status: Married  Human resources officer Violence: Not At Risk   Fear of Current or Ex-Partner: No   Emotionally Abused: No   Physically Abused: No   Sexually Abused: No    Outpatient Medications Prior to Visit  Medication Sig Dispense Refill   acetaminophen (TYLENOL) 650 MG CR tablet Take 1,300 mg by mouth as needed for pain.     atorvastatin (LIPITOR) 20 MG tablet TAKE (1) TABLET BY MOUTH AT BEDTIME. 90 tablet 0   CALCIUM PO Take by mouth.     carbamazepine (EQUETRO) 200 MG CP12 12 hr capsule Take 200 mg by mouth.     carbamazepine (TEGRETOL) 100 MG chewable tablet CHEW 1 TABLET BY MOUTH AT BEDTIME. 30 tablet 0   cariprazine (VRAYLAR) 3 MG capsule Take 1 capsule (3 mg total) by mouth daily. (Patient taking differently: Take 3 mg by mouth every other day.) 30 capsule 1   chlordiazePOXIDE (LIBRIUM) 10 MG capsule Take 1 capsule (10 mg total) by mouth at bedtime. 30 capsule 1   cholecalciferol (VITAMIN D) 25 MCG (1000 UNIT) tablet Take 1,000 Units by mouth daily.     dexmethylphenidate (FOCALIN) 10 MG tablet TAKE 1 AND 1/2 TABLETS BY MOUTH TWICE DAILY. 90 tablet 0   EQUETRO 300 MG  CP12 TAKE (1) CAPSULE BY MOUTH EVERY DAY. 30 capsule 0   escitalopram (  LEXAPRO) 10 MG tablet 1/2 tablet at night for 1 week, then 1 tablet at night 30 tablet 1   ferrous gluconate (FERGON) 324 MG tablet Take 324 mg by mouth daily with breakfast.     lamoTRIgine (LAMICTAL) 200 MG tablet TAKE (1) TABLET BY MOUTH TWICE DAILY. 60 tablet 0   lithium carbonate 150 MG capsule Take 1 capsule (150 mg total) by mouth at bedtime. 1 tab at bedtime (Patient taking differently: Take 150 mg by mouth at bedtime.) 90 capsule 1   Melatonin 10 MG CAPS Take by mouth at bedtime as needed.     pantoprazole (PROTONIX) 40 MG tablet TAKE (1) TABLET BY MOUTH ONCE DAILY. 90 tablet 0   promethazine-dextromethorphan (PROMETHAZINE-DM) 6.25-15 MG/5ML syrup Take 5 mLs by mouth 4 (four) times daily as needed. 100 mL 0   celecoxib (CELEBREX) 50 MG capsule Take 1 capsule (50 mg total) by mouth 2 (two) times daily. (Patient not taking: Reported on 08/21/2021) 60 capsule 0   doxycycline (VIBRAMYCIN) 100 MG capsule Take 1 capsule (100 mg total) by mouth 2 (two) times daily. 14 capsule 0   No facility-administered medications prior to visit.    Allergies  Allergen Reactions   Azithromycin Anaphylaxis   Penicillins Anaphylaxis    DID THE REACTION INVOLVE: Swelling of the face/tongue/throat, SOB, or low BP? Yes Sudden or severe rash/hives, skin peeling, or the inside of the mouth or nose? Yes Did it require medical treatment? No When did it last happen?       If all above answers are "NO", may proceed with cephalosporin use.   Adhesive [Tape] Other (See Comments)    On bandaids    Review of Systems  Constitutional:  Negative for chills and fever.  HENT:  Positive for ear pain, postnasal drip and sinus pressure. Negative for tinnitus.   Respiratory:  Negative for cough and shortness of breath.   Gastrointestinal:  Negative for diarrhea, nausea and vomiting.  Genitourinary:  Negative for dysuria and hematuria.   Musculoskeletal:  Negative for neck pain and neck stiffness.  Skin:  Negative for rash.  Neurological:  Negative for dizziness and syncope.  Psychiatric/Behavioral:  Negative for agitation and behavioral problems.       Objective:    Physical Exam Vitals reviewed.  Constitutional:      General: She is not in acute distress.    Appearance: She is obese. She is not diaphoretic.  HENT:     Head: Normocephalic and atraumatic.     Right Ear: No tenderness. A middle ear effusion is present.     Left Ear: No tenderness.  No middle ear effusion.     Nose: Nose normal. No nasal tenderness.     Right Sinus: No maxillary sinus tenderness.     Left Sinus: No maxillary sinus tenderness.     Mouth/Throat:     Mouth: Mucous membranes are moist.  Eyes:     General: No scleral icterus.    Extraocular Movements: Extraocular movements intact.  Cardiovascular:     Rate and Rhythm: Normal rate and regular rhythm.     Pulses: Normal pulses.     Heart sounds: Normal heart sounds. No murmur heard. Pulmonary:     Breath sounds: Normal breath sounds. No wheezing or rales.  Abdominal:     Palpations: Abdomen is soft.     Tenderness: There is no abdominal tenderness.  Musculoskeletal:     Cervical back: Neck supple. No tenderness.     Right lower  leg: No edema.     Left lower leg: No edema.  Skin:    General: Skin is warm.     Findings: No rash.  Neurological:     General: No focal deficit present.     Mental Status: She is alert and oriented to person, place, and time.     Sensory: No sensory deficit.     Motor: No weakness.     Gait: Gait abnormal.     Comments: Resting tremor of right hand  Psychiatric:        Behavior: Behavior normal.        Thought Content: Thought content normal.    BP 122/84 (BP Location: Right Arm, Patient Position: Sitting, Cuff Size: Normal)    Pulse 90    Resp 18    Ht 5' (1.524 m)    Wt 180 lb 1.3 oz (81.7 kg)    SpO2 98%    BMI 35.17 kg/m  Wt Readings from  Last 3 Encounters:  09/01/21 180 lb 1.3 oz (81.7 kg)  08/17/21 179 lb 1.9 oz (81.2 kg)  02/04/21 174 lb 6.4 oz (79.1 kg)        Assessment & Plan:   Problem List Items Addressed This Visit    Visit Diagnoses     Right acute serous otitis media, recurrence not specified    -  Primary Started Augmentin for possible otitis media Nasal saline spray for nasal congestion as chronic sinusitis can also cause ear fullness If persistent, will refer to ENT specialist [facility] PRN for excess ear wax   Relevant Medications   amoxicillin-clavulanate (AUGMENTIN) 875-125 MG tablet        Meds ordered this encounter  Medications   amoxicillin-clavulanate (AUGMENTIN) 875-125 MG tablet    Sig: Take 1 tablet by mouth 2 (two) times daily.    Dispense:  14 tablet    Refill:  0    Patient has tolerated Amoxicillin in the past.     Lindell Spar, MD

## 2021-09-02 ENCOUNTER — Ambulatory Visit: Payer: Medicare Other | Admitting: Psychiatry

## 2021-09-02 ENCOUNTER — Other Ambulatory Visit: Payer: Self-pay | Admitting: Psychiatry

## 2021-09-02 DIAGNOSIS — F314 Bipolar disorder, current episode depressed, severe, without psychotic features: Secondary | ICD-10-CM

## 2021-09-02 DIAGNOSIS — F319 Bipolar disorder, unspecified: Secondary | ICD-10-CM

## 2021-09-02 NOTE — Progress Notes (Signed)
[facility] Counselor/Therapist Progress Note  Patient ID: Megan Whitney, MRN: [id number],    Date: 09/02/2021  Time Spent: 55 minutes   Treatment Type: Individual Therapy  Reported Symptoms:  anxiety, depression, prior SI  1-2 wks ago but none currently, fearful thoughts about husband's cancer and assuming the worst  Mental Status Exam:  Appearance:   Casual     Behavior:  Appropriate, Sharing, and Motivated  Motor:  Normal  Speech/Language:   Clear and Coherent  Affect:  Anxious, depressed  Mood:  anxious  Thought process:  goal directed  Thought content:    Some overthinking, ruminating  Sensory/Perceptual disturbances:    WNL  Orientation:  oriented to person, place, time/date, situation, day of week, month of year, year, and stated date of Dec. 29, 2022  Attention:  Good  Concentration:  Fair  Memory:  WNL  Fund of knowledge:   Good  Insight:    Good and Fair  Judgment:   Good  Impulse Control:  Good and Fair   Risk Assessment: Danger to Self:  No Self-injurious Behavior: No Danger to Others: No Duty to Warn:no Physical Aggression / Violence:No  Access to Firearms a concern: No  Gang Involvement:No   Subjective: Patient in today reporting anxiety, some tearfulness, depression, frustration re: husband's cancer and her own mental health issues.  Assuming the worst case scenarios in situations. Husband angry at her and they have had arguments.  Patient struggling when she and  husband argue and last time felt SI and called Suicide hotline "and they talked me through it." Has had family in and out visiting during Christmas. Bickering between her and husband has been aggravating. Dog bit her recently but arm is ok. Was around mother-in-law recently and she was comforting to patient. Processes how husband is doing "all the wrong things against medical advice" but patient doesn't tell Dr involved because that would make husband mad. Fluctuates often between  frustration/anger/sadness and times when she is managing better. Focused more on her management of stress, frustration, anger,and sadness and patient worked well with this, using specific examples. Teeth grinding and using mouthguard. Also over-worrying and trying to understand it better and let go in order to move forward.  Interventions: Solution-Oriented/Positive Psychology, Ego-Supportive, and Insight-Oriented   Treatment Goal Plan of Care: Patient not signing tx plan on computer screen due to [place]. Treatment Goals: Goals remain on plan as patient works on strategies to meet her goals.  Progress is noted each visit in "Progress" section of goal plan. Long Term Goal: Reduce overall level, frequency, and intensity of the anxiety so that daily functioning is not impaired. Short Term Goal: 1.Increase understanding of the beliefs and messages that produce the worry and anxiety. Strategies: 1.Help client develop reality-based positive cognitive messages/self-talk. 2. Develop a "coping card" or other reminder which coping strategies are recorded for patient's later use.   Diagnosis:   ICD-10-CM   1. Bipolar I disorder with rapid cycling ([place])  F31.9      Plan: Patient today showing motivation and participated well in session today as she worked further on her anxiety, depression, anger, stress, and frustration as noted above. Process a lot of her anxiety, anger, agitating stress and frustration which helped patient feel heard, supported , equipped with more ways of managing the symptoms above and more grounded by session end. Encouraged patient as she acknowledges she does not have control over her husband's drinking and can only control her own behavior  and her responses to him and others and her being able to reach out to people that care about her.  Also encouraged patient and her practice of more positive behaviors including:  Staying in the present focusing on what she can change or  control. Believing in herself and her ability to make changes in the midst of difficult circumstances. Practice consistent self talk that is positive. Looking more for what might go right versus wrong. Taking breaks from her electronics. Increase her patience when things do not go as planned or expected. Interrupt anxious/fearful/depressive/negative thoughts; replace with more realistic thoughts. Stay on her medications as prescribed. Find the positives within herself and be able to list them. Staying in touch with supportive people. Remain involved in the various church groups that she has enjoyed. Get outside as she is able each day. Pay attention to "how I say what I say", and practice active listening. Spend time playing with her dogs as they are therapeutic for her. Stop assuming worst case scenarios. Stop self negating. Decrease overthinking and over analyzing. Reverse her tendency to always look for the negatives and second guess people/situations. Recognize the strength she shows working with goal-directed behaviors moving in a direction that supports improved emotional health.  Goal review and progress/challenges noted with patient.  Next appointment within 2 to 3 weeks.  This record has been created using [employer].  Chart creation errors have been sought, but may not always have been located and corrected.  Such creation errors do not reflect on the standard of medical care provided.   Shanon Ace, LCSW

## 2021-09-03 ENCOUNTER — Ambulatory Visit (HOSPITAL_COMMUNITY)
Admission: RE | Admit: 2021-09-03 | Discharge: 2021-09-03 | Disposition: A | Discharge status: Home / Self Care | Payer: Medicare Other | Source: Ambulatory Visit | Attending: Internal Medicine | Admitting: Internal Medicine

## 2021-09-03 ENCOUNTER — Other Ambulatory Visit: Payer: Self-pay

## 2021-09-03 DIAGNOSIS — M1710 Unilateral primary osteoarthritis, unspecified knee: Secondary | ICD-10-CM | POA: Insufficient documentation

## 2021-09-06 ENCOUNTER — Telehealth: Payer: Self-pay | Admitting: *Deleted

## 2021-09-06 ENCOUNTER — Telehealth: Payer: Self-pay | Admitting: Internal Medicine

## 2021-09-06 ENCOUNTER — Other Ambulatory Visit: Payer: Self-pay | Admitting: *Deleted

## 2021-09-06 DIAGNOSIS — F319 Bipolar disorder, unspecified: Secondary | ICD-10-CM

## 2021-09-06 DIAGNOSIS — M1710 Unilateral primary osteoarthritis, unspecified knee: Secondary | ICD-10-CM

## 2021-09-06 DIAGNOSIS — K5140 Inflammatory polyps of colon without complications: Secondary | ICD-10-CM

## 2021-09-06 DIAGNOSIS — K449 Diaphragmatic hernia without obstruction or gangrene: Secondary | ICD-10-CM

## 2021-09-06 DIAGNOSIS — K219 Gastro-esophageal reflux disease without esophagitis: Secondary | ICD-10-CM

## 2021-09-06 DIAGNOSIS — R269 Unspecified abnormalities of gait and mobility: Secondary | ICD-10-CM

## 2021-09-06 NOTE — Telephone Encounter (Signed)
error 

## 2021-09-06 NOTE — Telephone Encounter (Signed)
Referrals placed 

## 2021-09-06 NOTE — Telephone Encounter (Signed)
Pt came by office stating that insurance company needs referrals from PCP for current doctors   Dr. Lynder Parents , [facility]  -- appt 1/4  Rinaldo Cloud, [facility]   Jean Rosenthal , [facility]  [facility]   [place] NP -- obgyn

## 2021-09-08 ENCOUNTER — Ambulatory Visit: Payer: No Typology Code available for payment source | Admitting: Psychiatry

## 2021-09-08 ENCOUNTER — Encounter: Payer: Self-pay | Admitting: Internal Medicine

## 2021-09-08 ENCOUNTER — Other Ambulatory Visit: Payer: Self-pay

## 2021-09-08 ENCOUNTER — Encounter: Payer: Self-pay | Admitting: Psychiatry

## 2021-09-08 VITALS — BP 131/91 | HR 80

## 2021-09-08 DIAGNOSIS — Z636 Dependent relative needing care at home: Secondary | ICD-10-CM

## 2021-09-08 DIAGNOSIS — F900 Attention-deficit hyperactivity disorder, predominantly inattentive type: Secondary | ICD-10-CM | POA: Diagnosis not present

## 2021-09-08 DIAGNOSIS — G2401 Drug induced subacute dyskinesia: Secondary | ICD-10-CM

## 2021-09-08 DIAGNOSIS — R7989 Other specified abnormal findings of blood chemistry: Secondary | ICD-10-CM

## 2021-09-08 DIAGNOSIS — F411 Generalized anxiety disorder: Secondary | ICD-10-CM

## 2021-09-08 DIAGNOSIS — F319 Bipolar disorder, unspecified: Secondary | ICD-10-CM | POA: Diagnosis not present

## 2021-09-08 DIAGNOSIS — R296 Repeated falls: Secondary | ICD-10-CM

## 2021-09-08 DIAGNOSIS — G3184 Mild cognitive impairment, so stated: Secondary | ICD-10-CM | POA: Diagnosis not present

## 2021-09-08 DIAGNOSIS — R251 Tremor, unspecified: Secondary | ICD-10-CM

## 2021-09-08 MED ORDER — CLONIDINE HCL 0.1 MG PO TABS: ORAL_TABLET | ORAL | 1 refills | Status: DC

## 2021-09-08 MED ORDER — CARBAMAZEPINE ER 200 MG PO CP12: 200.0000 mg | ORAL_CAPSULE | Freq: Every evening | ORAL | 1 refills | Status: DC

## 2021-09-08 MED ORDER — CARIPRAZINE HCL 3 MG PO CAPS: 3.0000 mg | ORAL_CAPSULE | Freq: Every day | ORAL | 1 refills | Status: DC

## 2021-09-08 NOTE — Patient Instructions (Addendum)
Reduce escitalopram to 1/2 tablet daily for 1 week and stop it.  Clonidine 0.1 mg tablets for irritability and anxiety, take 1/2 tablet at night for 1 week,  then 1 at night for a week  then 1/2 tablet in the AM and 1 tablet at night Check BP and pulse 3 times per week and record including date and time  Stop Benadryl at night.

## 2021-09-08 NOTE — Progress Notes (Signed)
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 mouth at bedtime.) 90 capsule 1   Melatonin 10 MG CAPS Take by mouth at bedtime as needed.     pantoprazole (PROTONIX) 40 MG tablet TAKE (1) TABLET BY MOUTH ONCE DAILY. 90 tablet 0   promethazine-dextromethorphan (PROMETHAZINE-DM) 6.25-15 MG/5ML syrup Take 5 mLs by mouth 4 (four) times daily as needed. 100 mL 0   cariprazine (VRAYLAR) 3 MG capsule Take 1 capsule (3 mg total) by mouth daily. 30 capsule 1   No current facility-administered medications for this visit.    Medication Side Effects: Other: tremor and weight gain.   Dyskinesia appears better  SE bettter than they were.  Balance problems intermittently  Allergies:  Allergies  Allergen Reactions   Azithromycin Anaphylaxis   Penicillins Anaphylaxis    DID THE REACTION INVOLVE: Swelling of the face/tongue/throat, SOB, or low BP? Yes Sudden or severe rash/hives, skin peeling, or the inside of the mouth or nose? Yes Did it require medical treatment? No When did it last happen?       If all above answers are "NO", may proceed with cephalosporin use.   Adhesive  [Tape] Other (See Comments)    On bandaids    Past Medical History:  Diagnosis Date   ADD (attention deficit disorder)    Allergy    Seasonal   Anemia    History of GI blood loss   Anxiety    Arthritis    Atrophy of vagina 10/07/2020   Bipolar 1 disorder (HCC)    Cancer ([place])    Colon polyps    Depression    Diabetes mellitus ([place])    Edema, lower extremity    Epistaxis    Around 2011 or 2012, required cauterization.    Esophageal stricture    Fracture of superior pubic ramus (HCC) 11/28/2018   GERD (gastroesophageal reflux disease)    Headache(784.0)    Hyperlipidemia    Interstitial cystitis    Joint pain    Lactose intolerance    Lung cancer ([place]) 2002   Obesity    Osteoarthritis    Palpitations    Sleep apnea    Doesn't use a CPAP   Suicidal ideation 01/20/2020   Swallowing difficulty     Family History  Problem Relation Age of Onset   Arthritis Mother    Hearing loss Mother    Hyperlipidemia Mother    Hypertension  Mother    Depression Mother    Anxiety disorder Mother    Obesity Mother    Sudden death Mother    Hypertension Father    Diabetes Mellitus II Father    Heart disease Father    Arthritis Father    Cancer Father        Brain   COPD Father    Diabetes Father    Hyperlipidemia Father    Sleep apnea Father    Early death Sister        Aneroxia/Bulimic   Depression Brother    Early death Proofreader Accident   Depression Daughter    Drug abuse Daughter    Heart disease Daughter    Hypertension Daughter    Stroke Maternal Grandmother    Hypertension Maternal Grandmother    Arthritis Maternal Grandfather    Heart attack Maternal Grandfather    Hearing loss Maternal Grandfather    Colon cancer Neg Hx    Esophageal cancer Neg Hx    Rectal cancer Neg Hx     Social History   Socioeconomic History   Marital status: Married    Spouse name: Not on file   Number of children: 1   Years of education: Not on file   Highest  education level: Not on file  Occupational History   Occupation: Admin. assistant  Tobacco Use   Smoking status: Never   Smokeless tobacco: Never  Vaping Use   Vaping Use: Never used  Substance and Sexual Activity   Alcohol use: Yes    Alcohol/week: 1.0 standard drink    Types: 1 Glasses of wine per week    Comment: Moderate   Drug use: No   Sexual activity: Yes  Other Topics Concern   Not on file  Social History Narrative   Pt lives in [place] with husband Lanny Hurst.  Followed by Dr. Clovis Pu for psychiatry and Rinaldo Cloud for therapy.   Social Determinants of Health   Financial Resource Strain: Medium Risk   Difficulty of Paying Living Expenses: Somewhat hard  Food Insecurity: No Food Insecurity   Worried About Charity fundraiser in the Last Year: Never true   Ran Out of Food in the Last Year: Never true  Transportation Needs: No Transportation Needs   Lack of Transportation (Medical): No   Lack of Transportation (Non-Medical): No  Physical Activity: Inactive   Days of Exercise per Week: 0 days   Minutes of Exercise per Session: 0 min  Stress: No Stress Concern Present   Feeling of Stress : Only a little  Social Connections: Moderately Integrated   Frequency of Communication with Friends and Family: More than three times a week   Frequency of Social Gatherings with Friends and Family: More than three times a week   Attends Religious Services: More than 4 times per year   Active Member of [employer] or Organizations: No   Attends Archivist Meetings: Never   Marital Status: Married  Human resources officer Violence: Not At Risk   Fear of Current or Ex-Partner: No   Emotionally Abused: No   Physically Abused: No   Sexually Abused: No    Past Medical History, Surgical history, Social history, and Family history were reviewed and updated as appropriate.   Please see review of systems for further details on the patient's review from today.   Objective:   Physical Exam:   BP (!) 131/91    Pulse 80  Physical Exam Constitutional:      General: She is not in acute distress. Musculoskeletal:        General: No deformity.  Neurological:     Mental Status: She is alert and oriented to person, place, and time.     Cranial Nerves: No dysarthria.     Motor: Tremor present.     Coordination: Coordination normal.     Comments: Mild resting R hand rotational tremor Fidgety mildy with feet Very mild lip pursing.  Psychiatric:        Attention and Perception: Attention and perception normal. She does not perceive auditory or visual hallucinations.        Mood and Affect: Mood is anxious and depressed. Affect is not labile, blunt, angry or inappropriate.        Speech: Speech normal. Speech is not rapid and pressured.        Behavior: Behavior normal. Behavior is not agitated. Behavior is cooperative.        Thought Content: Thought content normal. Thought content is not paranoid or delusional. Thought content does not include homicidal or suicidal ideation. Thought content does not include homicidal or suicidal plan.        Cognition and Memory: Cognition and memory normal.        Judgment: Judgment normal.     Comments: Insight intact Continued mood cycling.     Lab Review:     Component Value Date/Time   NA 141 08/12/2021 0911   K 4.4 08/12/2021 0911   CL 101 08/12/2021 0911   CO2 24 08/12/2021 0911   GLUCOSE 90 08/12/2021 0911   GLUCOSE 102 (H) 01/19/2020 1158   BUN 17 08/12/2021 0911   CREATININE 0.91 08/12/2021 0911   CALCIUM 9.5 08/12/2021 0911   PROT 6.7 08/12/2021 0911   ALBUMIN 4.3 08/12/2021 0911   AST 19 08/12/2021 0911   ALT 11 08/12/2021 0911   ALKPHOS 63 08/12/2021 0911   BILITOT 0.2 08/12/2021 0911   GFRNONAA 83 03/02/2020 1433   GFRAA 96 03/02/2020 1433       Component Value Date/Time   WBC 7.4 08/12/2021 0911   WBC 7.4 01/19/2020 1158   RBC 4.70 08/12/2021 0911   RBC 4.19 01/19/2020 1158   HGB 14.2 08/12/2021 0911   HCT  43.7 08/12/2021 0911   PLT 277 08/12/2021 0911   MCV 93 08/12/2021 0911   MCH 30.2 08/12/2021 0911   MCH 30.3 01/19/2020 1158   MCHC 32.5 08/12/2021 0911   MCHC 32.1 01/19/2020 1158   RDW 12.1 08/12/2021 0911   LYMPHSABS 2.8 08/12/2021 0911   MONOABS 0.7 04/04/2019 1004   EOSABS 0.4 08/12/2021 0911   BASOSABS 0.0 08/12/2021 0911  Vitamin D level 33 on 10K units daily on 12/4/`9 Increased to prescription vitamin d 50K units Monday, Wed, Friday.  Rx sent in.   Lithium Lvl  Date Value Ref Range Status  10/21/2018 0.18 (L) 0.60 - 1.20 mmol/L Final    Comment:    Performed at [facility], [address] [place], [place] [address]     No results found for: PHENYTOIN, PHENOBARB, VALPROATE, CBMZ   .res Assessment: Plan:    Bipolar I disorder with rapid cycling ([place]) - Plan: carbamazepine (CARBATROL) 200 MG 12 hr capsule, cloNIDine (CATAPRES) 0.1 MG tablet, cariprazine (VRAYLAR) 3 MG capsule  Generalized anxiety disorder - Plan: cloNIDine (CATAPRES) 0.1 MG tablet  Attention deficit hyperactivity disorder (ADHD), predominantly inattentive type  Mild cognitive impairment  Tremor of both  hands  Falling  Tardive dyskinesia  Low vitamin D level  Caregiver stress  Alaney has chronic rapid cycling bipolar disorder which is chronically unstable and has been difficult to control.  The rapid cycling is making it difficult to control frequency of depressive episodes and the anxiety as well.  We have typically had to make frequent med changes.  We have had to reduce mood stabilizer dosages including Vraylar and Equetro she is very sensitive to carbamazepine because higher doses cause dizziness.    Since last visit rapid cycled out of mania and into depression.  More cycling with less Vraylar.  No easy solution to this.  She's tried all FDA approved meds for bipolar depression.  Failed many other meds too. Since the last appointment she has more rapic cycled into depression  Disc ECT.  Only FDA approved option left. She was denied patient assistance with Vraylar.   She is more depressed at the moment but it is likely that the added stress with her husband is a contributing factor and unlikely that any immediate med change is going to help.  She is failed multiple prior meds for depression.    Only other option reasonable is ECT  Wear a mouthguard bc of teeth grinding.  It' smuch better off [place].  Prone to UTI and may be causing confusion discussed.  Had confirmed UTI recently.  Since last year when she was stable she started having hypomanic symptoms again.  We increased Equetro back to 300 mg nightly but she she has had balance problems. It had stopped the manic symptoms but then she started having balance problems again and we had to switch it back to Equetro 200 and add carbamazepine immediate release 100 nightly..  intolerant of Equetro 300 mg HS. After New Year switch Equetro to Carbatrol in hopes of better coverage.  Out of work ADD less of a problem.  Discussed the risk of stimulants including that that could contribute to mood instability and mood swings.    She has a high residual anxiety.  It has been impossible to control all of her symptoms simultaneously without causing side effects. Failed various meds.  She agrees.  Continue the following Discussed side effects of each medicine. Continue   Focalin XR 20 mg  To IR 15 mg BID DT Cost and off label for depression  Try to spread this out if possible for mood.  Switch Equetro to Carbatrol 200 in hopes for better $ Carbamazepine immediate release 100 mg nightly Lamotrigine 200 mg twice daily Lithium 150 mg nightly Continue Librium 25 HS bc needed for sleep Vraylar 1.5 mg daily Using samples Disc risk worsening TD and tremor. Failed all reasonable alternatives for anxiety.  Option viibryd Librium to 10 mg HS DT ? Effect. Clonidine off label for irritability and anxiety 0.05 mg HS for 1 week, then 1 at  night for a week then 1/2 tablet in the AM and 1 tablet at night Stop Benadryl at night.    Discussed potential metabolic side effects associated with atypical antipsychotics, as well as potential risk for movement side effects. Advised pt to contact office if movement side effects occur.  She may be having some mild TD with toe movement and grimacing.  Disc this in detail.  At this point it's tolerable.  . Suspect it.  "I don't realize it". But sometimes others notice  Option reduce Librium but she has no hangover No additonal sedatives during the day which fight the benefit of  the stimulants and be more likely to trigger depression and poor activitty level. We discussed the short-term risks associated with benzodiazepines including sedation and increased fall risk among others.  Discussed long-term side effect risk including dependence, potential withdrawal symptoms, and the potential eventual dose-related risk of dementia.  But recent studies from 2020 dispute this association between benzodiazepines and dementia risk. Newer studies in 2020 do not support an association with dementia.  Disc SE meds and this is heightened by the complication of necessary polypharmacy.  Supportive therapy in terms of dealing with husband's addiction and now new dx metastatic prostate CA.Marland Kitchen Disc poss PT job to help with socialization.  Requires frequent FU DT chronic instability.  Wants to schedule monthly.  FU 4 weeks  Lynder Parents MD, DFAPA  Please see After Visit Summary for patient specific instructions. Reduce escitalopram to 1/2 tablet daily for 1 week and stop it.  Clonidine 0.1 mg tablets for irritability and anxiety, take 1/2 tablet at night for 1 week,  then 1 at night for a week  then 1/2 tablet in the AM and 1 tablet at night  Stop Benadryl at night.   Future Appointments  Date Time Provider [place]  09/16/2021 10:00 AM Shanon Ace, LCSW [facility] None  09/20/2021  3:15 PM Mcarthur Rossetti, MD [facility] None  09/30/2021 10:00 AM Shanon Ace, LCSW [facility] None  10/06/2021  2:00 PM Cottle, Billey Co., MD [facility] None  10/14/2021 10:00 AM Shanon Ace, LCSW [facility] None  10/28/2021 10:00 AM Shanon Ace, LCSW [facility] None  11/03/2021  2:00 PM Cottle, Billey Co., MD [facility] None  11/11/2021 10:00 AM Shanon Ace, LCSW [facility] None  11/25/2021 10:00 AM Shanon Ace, LCSW [facility] None  02/15/2022  1:00 PM Lindell Spar, MD [facility]    No orders of the defined types were placed in this encounter.      -------------------------------

## 2021-09-09 ENCOUNTER — Other Ambulatory Visit: Payer: Self-pay

## 2021-09-09 ENCOUNTER — Telehealth: Payer: Self-pay | Admitting: Psychiatry

## 2021-09-09 DIAGNOSIS — F319 Bipolar disorder, unspecified: Secondary | ICD-10-CM

## 2021-09-09 MED ORDER — CARBAMAZEPINE 100 MG PO CHEW: CHEWABLE_TABLET | ORAL | 0 refills | Status: DC

## 2021-09-09 NOTE — Telephone Encounter (Signed)
RX Sent. 

## 2021-09-09 NOTE — Telephone Encounter (Signed)
Pt called and said that the carbamazepine 200 mg is suppose to be chewable tablets not capsules. Please resend the correct script

## 2021-09-11 ENCOUNTER — Other Ambulatory Visit: Payer: Self-pay

## 2021-09-11 ENCOUNTER — Ambulatory Visit: Payer: Medicare Other

## 2021-09-11 ENCOUNTER — Ambulatory Visit (INDEPENDENT_AMBULATORY_CARE_PROVIDER_SITE_OTHER): Payer: No Typology Code available for payment source

## 2021-09-11 ENCOUNTER — Ambulatory Visit
Admission: RE | Admit: 2021-09-11 | Discharge: 2021-09-11 | Disposition: A | Discharge status: Home / Self Care | Payer: No Typology Code available for payment source | Source: Ambulatory Visit | Attending: Urgent Care | Admitting: Urgent Care

## 2021-09-11 VITALS — BP 130/83 | HR 100 | Temp 99.2°F | Resp 18

## 2021-09-11 DIAGNOSIS — M25572 Pain in left ankle and joints of left foot: Secondary | ICD-10-CM | POA: Diagnosis not present

## 2021-09-11 DIAGNOSIS — M79662 Pain in left lower leg: Secondary | ICD-10-CM | POA: Diagnosis not present

## 2021-09-11 DIAGNOSIS — G8929 Other chronic pain: Secondary | ICD-10-CM

## 2021-09-11 DIAGNOSIS — M7989 Other specified soft tissue disorders: Secondary | ICD-10-CM | POA: Diagnosis not present

## 2021-09-11 DIAGNOSIS — I781 Nevus, non-neoplastic: Secondary | ICD-10-CM

## 2021-09-11 MED ORDER — DICLOFENAC SODIUM 3 % EX GEL: 1.0000 "application " | Freq: Two times a day (BID) | CUTANEOUS | 0 refills | Status: DC

## 2021-09-11 NOTE — ED Provider Notes (Signed)
[facility]   MRN: [id number] DOB: 1955/10/21  Subjective:   Megan Whitney is a 66 y.o. female presenting for 4-day history of acute on chronic left lower leg pain that radiates into the left ankle.  Patient has actually had chronic ankle pain and sees an orthopedist for this, Dr. Ninfa Linden.  She does have an appointment with them on the 16th.  Also sees Dr. Posey Pronto.  Has previously been advised that she has capsulitis of the left ankle.  She actually had an injection in December for this.  No recent fall, trauma, redness, wounds.  No history of dvt.    No current facility-administered medications for this encounter.  Current Outpatient Medications:    acetaminophen (TYLENOL) 650 MG CR tablet, Take 1,300 mg by mouth as needed for pain., Disp: , Rfl:    amoxicillin-clavulanate (AUGMENTIN) 875-125 MG tablet, Take 1 tablet by mouth 2 (two) times daily., Disp: 14 tablet, Rfl: 0   atorvastatin (LIPITOR) 20 MG tablet, TAKE (1) TABLET BY MOUTH AT BEDTIME., Disp: 90 tablet, Rfl: 0   CALCIUM PO, Take by mouth., Disp: , Rfl:    carbamazepine (CARBATROL) 200 MG 12 hr capsule, Take 1 capsule (200 mg total) by mouth at bedtime., Disp: 30 capsule, Rfl: 1   carbamazepine (TEGRETOL) 100 MG chewable tablet, CHEW 1 TABLET BY MOUTH AT BEDTIME., Disp: 30 tablet, Rfl: 0   cariprazine (VRAYLAR) 3 MG capsule, Take 1 capsule (3 mg total) by mouth daily., Disp: 30 capsule, Rfl: 1   chlordiazePOXIDE (LIBRIUM) 10 MG capsule, Take 1 capsule (10 mg total) by mouth at bedtime., Disp: 30 capsule, Rfl: 1   cholecalciferol (VITAMIN D) 25 MCG (1000 UNIT) tablet, Take 1,000 Units by mouth daily., Disp: , Rfl:    cloNIDine (CATAPRES) 0.1 MG tablet, 1/2 tablet at night for 1 week, then 1 tablet at night for 1 week then 1/2 tablet in the morning and 1 tablet at night, Disp: 45 tablet, Rfl: 1   dexmethylphenidate (FOCALIN) 10 MG tablet, TAKE 1 AND 1/2 TABLETS BY MOUTH TWICE DAILY., Disp: 90 tablet, Rfl: 0    EQUETRO 200 MG CP12 12 hr capsule, Take 1 capsule (200 mg total) by mouth at bedtime., Disp: 30 capsule, Rfl: 0   ferrous gluconate (FERGON) 324 MG tablet, Take 324 mg by mouth daily with breakfast., Disp: , Rfl:    lamoTRIgine (LAMICTAL) 200 MG tablet, TAKE (1) TABLET BY MOUTH TWICE DAILY., Disp: 60 tablet, Rfl: 0   lithium carbonate 150 MG capsule, Take 1 capsule (150 mg total) by mouth at bedtime. 1 tab at bedtime (Patient taking differently: Take 150 mg by mouth at bedtime.), Disp: 90 capsule, Rfl: 1   Melatonin 10 MG CAPS, Take by mouth at bedtime as needed., Disp: , Rfl:    pantoprazole (PROTONIX) 40 MG tablet, TAKE (1) TABLET BY MOUTH ONCE DAILY., Disp: 90 tablet, Rfl: 0   promethazine-dextromethorphan (PROMETHAZINE-DM) 6.25-15 MG/5ML syrup, Take 5 mLs by mouth 4 (four) times daily as needed., Disp: 100 mL, Rfl: 0   Allergies  Allergen Reactions   Azithromycin Anaphylaxis   Penicillins Anaphylaxis    DID THE REACTION INVOLVE: Swelling of the face/tongue/throat, SOB, or low BP? Yes Sudden or severe rash/hives, skin peeling, or the inside of the mouth or nose? Yes Did it require medical treatment? No When did it last happen?       If all above answers are "NO", may proceed with cephalosporin use.   Adhesive [Tape] Other (See Comments)  On bandaids    Past Medical History:  Diagnosis Date   ADD (attention deficit disorder)    Allergy    Seasonal   Anemia    History of GI blood loss   Anxiety    Arthritis    Atrophy of vagina 10/07/2020   Bipolar 1 disorder (HCC)    Cancer (HCC)    Colon polyps    Depression    Diabetes mellitus ([place])    Edema, lower extremity    Epistaxis    Around 2011 or 2012, required cauterization.    Esophageal stricture    Fracture of superior pubic ramus (HCC) 11/28/2018   GERD (gastroesophageal reflux disease)    Headache(784.0)    Hyperlipidemia    Interstitial cystitis    Joint pain    Lactose intolerance    Lung cancer ([place]) 2002    Obesity    Osteoarthritis    Palpitations    Sleep apnea    Doesn't use a CPAP   Suicidal ideation 01/20/2020   Swallowing difficulty      Past Surgical History:  Procedure Laterality Date   BALLOON DILATION  05/16/2012   Procedure: BALLOON DILATION;  Surgeon: Inda Castle, MD;  Location: [place];  Service: Endoscopy;  Laterality: N/A;   BUNIONECTOMY  2011   COLONOSCOPY     ENTEROSCOPY  05/16/2012   Procedure: ENTEROSCOPY;  Surgeon: Inda Castle, MD;  Location: [place];  Service: Endoscopy;  Laterality: N/A;   JOINT REPLACEMENT     right shoulder durgery 71 yrs ago  [place]  2006, 2008   bilateral   TUBAL LIGATION  1990   WEDGE RESECTION  2002   lung cancer    Family History  Problem Relation Age of Onset   Arthritis Mother    Hearing loss Mother    Hyperlipidemia Mother    Hypertension Mother    Depression Mother    Anxiety disorder Mother    Obesity Mother    Sudden death Mother    Hypertension Father    Diabetes Mellitus II Father    Heart disease Father    Arthritis Father    Cancer Father        Brain   COPD Father    Diabetes Father    Hyperlipidemia Father    Sleep apnea Father    Early death Sister        Aneroxia/Bulimic   Depression Brother    Early death Proofreader Accident   Depression Daughter    Drug abuse Daughter    Heart disease Daughter    Hypertension Daughter    Stroke Maternal Grandmother    Hypertension Maternal Grandmother    Arthritis Maternal Grandfather    Heart attack Maternal Grandfather    Hearing loss Maternal Grandfather    Colon cancer Neg Hx    Esophageal cancer Neg Hx    Rectal cancer Neg Hx     Social History   Tobacco Use   Smoking status: Never   Smokeless tobacco: Never  Vaping Use   Vaping Use: Never used  Substance Use Topics   Alcohol use: Yes    Alcohol/week: 1.0 standard drink    Types: 1 Glasses of wine per week    Comment: Moderate   Drug use: No     ROS   Objective:   Vitals: BP 130/83 (BP Location: Right Arm)    Pulse 100  Temp 99.2 F (37.3 C) (Oral)    Resp 18    SpO2 92%   Physical Exam Constitutional:      General: She is not in acute distress.    Appearance: Normal appearance. She is well-developed. She is not ill-appearing, toxic-appearing or diaphoretic.  HENT:     Head: Normocephalic and atraumatic.     Nose: Nose normal.     Mouth/Throat:     Mouth: Mucous membranes are moist.  Eyes:     General: No scleral icterus.       Right eye: No discharge.        Left eye: No discharge.     Extraocular Movements: Extraocular movements intact.     Conjunctiva/sclera: Conjunctivae normal.  Cardiovascular:     Rate and Rhythm: Normal rate.  Pulmonary:     Effort: Pulmonary effort is normal.  Musculoskeletal:     Left lower leg: Tenderness (anterior inferior going into the anterior ankle) present. No swelling, deformity, lacerations or bony tenderness. No edema.     Comments: Negative Homans' sign.  Multiple telangiectasia, varicose veins of the left lower extremity.  Skin:    General: Skin is warm and dry.  Neurological:     General: No focal deficit present.     Mental Status: She is alert and oriented to person, place, and time.  Psychiatric:        Mood and Affect: Mood normal.        Behavior: Behavior normal.    DG Tibia/Fibula Left  Result Date: 09/11/2021 CLINICAL DATA:  66 year old female with pain and swelling in the leg for 4 days. Painful walking. EXAM: LEFT TIBIA AND FIBULA - 2 VIEW COMPARISON:  Left knee series 09/03/2021. Ankle series 09/02/2019. FINDINGS: Bone mineralization is within normal limits for age. Alignment is preserved at the left knee and ankle. No joint effusion evident. No acute osseous abnormality identified. IMPRESSION: No acute osseous abnormality identified about the left tib-fib. Electronically Signed   By: Genevie Ann M.D.   On: 09/11/2021 11:50     Assessment and Plan :   PDMP  not reviewed this encounter.  1. Chronic pain of left ankle   2. Pain in left lower leg   3. Telangiectasia of skin    X-ray is negative, recommend that she start wearing compression socks and use diclofenac gel for supportive care.  Maintain follow-up appointment with orthopedist. Counseled patient on potential for adverse effects with medications prescribed/recommended today, ER and return-to-clinic precautions discussed, patient verbalized understanding.    Jaynee Eagles, PA-C 09/11/21 1159

## 2021-09-11 NOTE — Discharge Instructions (Addendum)
I highly recommend wearing compression socks which can be obtained over-the-counter.  This can help with what I think is varicose veins.  These absolutely can cause your kind of pain.  For now I would like you to use diclofenac gel over the area to help via with local pain relief.  Make sure you follow-up with the vascular and vein specialty clinic.  Also check in with your orthopedist as scheduled in the next couple of weeks for your chronic ankle pain.

## 2021-09-11 NOTE — ED Triage Notes (Signed)
Pt reports pain and swelling in the front left lower leg goes to left ankle x 4 days. Pain is worse when walking. States she was not able to walk due to pain.

## 2021-09-13 ENCOUNTER — Other Ambulatory Visit: Payer: Self-pay | Admitting: Psychiatry

## 2021-09-13 DIAGNOSIS — F319 Bipolar disorder, unspecified: Secondary | ICD-10-CM

## 2021-09-16 ENCOUNTER — Ambulatory Visit (INDEPENDENT_AMBULATORY_CARE_PROVIDER_SITE_OTHER): Payer: No Typology Code available for payment source | Admitting: Psychiatry

## 2021-09-16 ENCOUNTER — Encounter: Payer: Medicare Other | Admitting: Psychiatry

## 2021-09-16 DIAGNOSIS — F319 Bipolar disorder, unspecified: Secondary | ICD-10-CM | POA: Diagnosis not present

## 2021-09-16 NOTE — Progress Notes (Signed)
A user error has taken place: encounter opened in error, closed for administrative reasons Appointment marked as no show by mistake. New encounter taking place of no show appointment.

## 2021-09-16 NOTE — Progress Notes (Signed)
[facility] Counselor/Therapist Progress Note  Patient ID: Megan Whitney, MRN: [id number],    Date: 09/16/2021  Time Spent: 50 minutes   Treatment Type: Individual Therapy  Reported Symptoms: depression, despair at times, anxiety, exhausted, was at ER with husband last night, some lower motivation  Mental Status Exam:  Appearance:   Neat     Behavior:  Appropriate and Sharing  Motor:  Normal  Speech/Language:   Clear and Coherent  Affect:  Depressed and anxious  Mood:  anxious and depressed  Thought process:  goal directed  Thought content:    Overthinking, some obsessiveness  Sensory/Perceptual disturbances:    WNL  Orientation:  oriented to person, place, time/date, situation, day of week, month of year, year, and stated date of Jan. 12, 2023  Attention:  Good  Concentration:  Fair  Memory:  [place] of knowledge:   Good  Insight:    Good and Fair  Judgment:   Good  Impulse Control:  Good and Fair   Risk Assessment:[place] Danger to Self:  No Self-injurious Behavior: No Danger to Others: No Duty to Warn:no Physical Aggression / Violence:No  Access to Firearms a concern: No  Gang Involvement:No   Subjective: Patient today reporting depression No SI, anxiety, stressed, exhausted, some despair at times.  Symptoms related to personal, marital, and husband's cancer and his undermining his treatment by continuing to consume significant alcohol. Needing to "vent my concerns and feelings today" and patient did this, expressing her anxieties and worst case scenarios." After her sharing a lot of her anxiety, she did some concentrated work on stopping her assumption of "the worst" and discussing what her coping might be like if she were to try staying more grounded, not assume the worst, and she responded that this would allow her to be more attentive and proactive in situations. To continue working with this between sessions. Reports less bickering between her and  husband.Husband still going against medical advice and patient acknowledges she can't control that. Continues to fluctuate between anger/sadness/frustration and times when she feels she is managing better, "is like a roller coaster at times." Teeth grinding she reports has lessened some.Staying in contact with supportive family and friends.   Interventions: Solution-Oriented/Positive Psychology, Ego-Supportive, and Insight-Oriented   Treatment Goal Plan of Care: Patient not signing tx plan on computer screen due to [place]. Treatment Goals: Goals remain on plan as patient works on strategies to meet her goals.  Progress is noted each visit in "Progress" section of goal plan. Long Term Goal: Reduce overall level, frequency, and intensity of the anxiety so that daily functioning is not impaired. Short Term Goal: 1.Increase understanding of the beliefs and messages that produce the worry and anxiety. Strategies: 1.Help client develop reality-based positive cognitive messages/self-talk. 2. Develop a "coping card" or other reminder which coping strategies are recorded for patient's later use. Diagnosis:   ICD-10-CM   1. Bipolar I disorder with rapid cycling ([place])  F31.9      Plan: Patient today showing motivation although not quite as strong but did seem to strengthen over the course of session and she did some good work on her anxiety, depression, stress, frustration, and especially her tendency to assume worst case scenarios which has been happening more in the last 24 hours.  As noted above she focused on interrupting the automatic assumptions of "the worst" and worked on how to maintain some calmness and be able to not automatically jump to worst case scenario  thinking and instead, try holding steady understanding that things can be very uncertain but uncertainty does not necessarily always lead to worst case scenarios.  Patient stated that she seemed to understand this better and the way that we  spoke about it today and plans to work on this more in between sessions.  Husband who has cancer, still not really following medical advice some of the time and patient states she cannot make him and is trying to focus more on herself and being able to support as she is able.  Encouraged her in the practice of more positive behaviors including:    Believing in herself and her ability to make changes in the midst of difficult circumstances. For every negative thought create 2 positives. Reflect on her progress and positives more often. Stay in the present focusing on what she can change. Practice consistent self talk that is more positive. Looking more for what might go right versus wrong. Taking breaks from her electronics occasionally each day. Increase her patience when things do not go as planned or expected. Interrupt anxious/fearful/depressive/negative thoughts, to replace with more realistic thoughts. Remain on her prescribed medications. Staying in touch with supportive people. Find the positives within herself and be able to list them. Stay in contact with church people in groups that have been supportive. Get outside some each day as she is able. Spend time playing with her dogs as they are very therapeutic for her. Work with the communication skills especially active listening and "how I say what I say". Stop assuming worst case scenarios. Stop self negating. Decrease overthinking and over analyzing. Reverse her tendency to second guess people and situations. Realize the strength she shows working with goal-directed behaviors to move in a direction that supports improved emotional health.  Goal review and progress/challenges noted with patient.  Next appointment within 1 to 2 weeks.  This record has been created using [employer].  Chart creation errors have been sought, but may not always have been located and corrected.  Such creation errors do not reflect on the standard  of medical care provided.  Shanon Ace, LCSW

## 2021-09-17 ENCOUNTER — Ambulatory Visit: Payer: No Typology Code available for payment source | Admitting: Podiatry

## 2021-09-20 ENCOUNTER — Other Ambulatory Visit: Payer: Self-pay

## 2021-09-20 ENCOUNTER — Ambulatory Visit (INDEPENDENT_AMBULATORY_CARE_PROVIDER_SITE_OTHER): Payer: No Typology Code available for payment source | Admitting: Orthopaedic Surgery

## 2021-09-20 DIAGNOSIS — M79605 Pain in left leg: Secondary | ICD-10-CM

## 2021-09-20 MED ORDER — PREDNISONE 50 MG PO TABS: ORAL_TABLET | ORAL | 0 refills | Status: DC

## 2021-09-20 NOTE — Progress Notes (Signed)
The patient is someone I am seeing for the first time.  She is an active 66 year old female who actually has a history of bilateral hip replacements done by Dr. Lenn Sink many years ago.  He is out of network as well.  Her hips are doing well other than occasional pain over the lateral aspect of her right hip.  What brought her in for [place] is due to recent acute pain in her left leg.  She says her left knee gave way on her once and her left ankle was then hurting quite a bit.  She has x-rays on the canopy system of her left knee and left tibia and fibula.  I have reviewed these and that shows some osteopenic bone but really good alignment overall of her left ankle joint and her left knee with no acute findings.  She is actually feeling better today and some of her symptoms are decreasing.  She denies any neuropathy.  She is not a diabetic.  She denies any radicular symptoms or back pain.  There is no listed headache, chest pain, shortness of breath, fever, chills, nausea, vomiting  Examination of her left knee shows no acute findings with excellent range of motion.  Examination of her left ankle does show some pain over the anterior ankle itself and just slightly lateral but anterior to the fibula.  This seems to be to be more of a tendinitis.  Her ankle is ligamentously stable with excellent range of motion.  Her right hip moves smoothly and fluidly with just a little bit of pain over the trochanteric bursa area.  Again I reviewed the x-rays of her left knee and her left ankle showing the tibia and fibula and these were all normal.  I would like to start her on 5 days of a steroid to see if this will calm down the inflammation that she is experiencing.  She would also benefit from outpatient physical therapy for any modalities that can strengthen her left knee as well as decrease the pain of her left ankle and her right hip bursa and IT band.  These will be any modalities per the therapist discretion.  This can  be set up in [place].  I will then see her back in about 6 weeks to see how she is doing overall.  All questions and concerns were answered and addressed.  She agrees with this treatment plan.

## 2021-09-21 ENCOUNTER — Telehealth: Payer: Self-pay | Admitting: Psychiatry

## 2021-09-21 NOTE — Telephone Encounter (Signed)
Tell her we have few options.  She needs to stay on this but she can drop it back to 1/2 tablet at night

## 2021-09-21 NOTE — Telephone Encounter (Signed)
Patient lm stating she is having difficulty with her new medication Clonidine.  She stated she is not taking precisely as directed. She also states she has developed mouth sores/ulcers that may be a result of this new medication. Please advise. # [phone].Follow appointment scheduled for 2/1.

## 2021-09-21 NOTE — Telephone Encounter (Signed)
Please see phone message. Called patient and she provided the same information. She stated she took 1/2 tablet initially and then went to 1 mg tablet. She said with the 1 mg tablet she had excruciating headaches and nausea was worse. She has now dropped back down to 1/2 tablet and said the headache is better, but all other symptoms are worse.

## 2021-09-22 NOTE — Telephone Encounter (Signed)
Patient notified of clonidine recommendations.

## 2021-09-27 ENCOUNTER — Ambulatory Visit (HOSPITAL_COMMUNITY): Payer: No Typology Code available for payment source | Attending: Orthopaedic Surgery

## 2021-09-28 ENCOUNTER — Ambulatory Visit (INDEPENDENT_AMBULATORY_CARE_PROVIDER_SITE_OTHER): Payer: No Typology Code available for payment source | Admitting: Psychiatry

## 2021-09-28 ENCOUNTER — Other Ambulatory Visit: Payer: Self-pay

## 2021-09-28 DIAGNOSIS — F319 Bipolar disorder, unspecified: Secondary | ICD-10-CM | POA: Diagnosis not present

## 2021-09-28 NOTE — Progress Notes (Signed)
[facility] Counselor/Therapist Progress Note  Patient ID: Megan Whitney, MRN: [id number],    Date: 09/28/2021  Time Spent: 50 minutes   Treatment Type: Individual Therapy  Reported Symptoms: anxiety, depression, some motivation, sometimes hopelessness and despair ("not today")  Mental Status Exam:  Appearance:   Casual     Behavior:  Appropriate, Sharing, and Motivated  Motor:  Normal  Speech/Language:   Clear and Coherent  Affect:  Depressed and anxiety  Mood:  anxious and depressed  Thought process:  goal directed  Thought content:    Obsessions and some overthinking  Sensory/Perceptual disturbances:    WNL  Orientation:  oriented to person, place, time/date, situation, day of week, month of year, year, and stated date of Jan. 24, 2023  Attention:  Fair  Concentration:  Good and Fair  Memory:  [place] of knowledge:   Good  Insight:    Fair  Judgment:   Good and Fair  Impulse Control:  Good and Fair   Risk Assessment: Danger to Self:  No Self-injurious Behavior: No Danger to Others: No Duty to Warn:no Physical Aggression / Violence:No  Access to Firearms a concern: No  Gang Involvement:No   Subjective: Patient in today reporting anxiety, depression, some tearfulness, some motivation, and "occasional hopeless and despair" (not currently). Symptoms related to marital, personal issues, and husband's cancer.(Husband also undermining tx due to drinking.) Feels good about having joined [employer] again. Upset today over issues that feel "competitive and we can't win" with other set of grandparents involved, and patient processed this at length today and seemed to feel better and more grounded by session end. No current SI, doesn't appear nor state that she is as exhausted and stressed as she was at last session. Frustrated that husband continues to go against medical advice in drinking while being treated for cancer. Today reflected on some of her work last session in not  assuming worst case scenarios, and shared some challenges in following up on that outside of session as she has had some success in "not assuming the worst "outside of sessions, and encouraged to continue this.  Interventions: Solution-Oriented/Positive Psychology, Ego-Supportive, and Insight-Oriented   Treatment Goal Plan of Care: Patient not signing tx plan on computer screen due to [place]. Treatment Goals: Goals remain on plan as patient works on strategies to meet her goals.  Progress is noted each visit in "Progress" section of goal plan. Long Term Goal: Reduce overall level, frequency, and intensity of the anxiety so that daily functioning is not impaired. Short Term Goal: 1.Increase understanding of the beliefs and messages that produce the worry and anxiety. Strategies: 1.Help client develop reality-based positive cognitive messages/self-talk. 2. Develop a "coping card" or other reminder which coping strategies are recorded for patient's later use.  Diagnosis:   ICD-10-CM   1. Bipolar I disorder with rapid cycling ([place])  F31.9      Plan: Patient today showing motivation and participated well in session venting and working on her anxiety, depression, and especially not assuming worst case scenarios, self-calming, trying to hold steady and understand that things can be uncertain and uncertainty doesn't always lead to worst case scenarios.  Has actually held on some to the fact that uncertainty does not always lead to worst case scenarios, since her last appointment, as she shared this in session today.  Encouraged her continued involvement with friends, extended family, and making her own self-care a priority as she continues to be a support to  her husband who has cancer.  Encouraged patient and her trying to practice more positive behaviors including: Reflecting on any of her progress more often, for every negative thought create 2 positives, believing in herself and her ability to make  changes in the midst of difficult circumstances, staying in the present focusing on what she can change, practice consistent positive self talk, looking more for what might go right versus wrong, taking breaks from her electronics occasionally each day, increase her patience when things do not go as planned or expected, interrupt anxious/fearful/depressive/negative thoughts to replace with more realistic thoughts, remain on her prescribed medications, staying in touch with supportive people, finding the positives within herself and be able to list them, staying in contact with her church friends that have been supportive, get outside some each day as she is able, spend time playing with her dogs as they are very therapeutic for her, work with the communication skills especially active listening and "how I say what I say" that we have worked with in sessions, stop assuming worst case scenarios, stop self negating, decrease overthinking and over analyzing, reverse her tendency to second guess people and situations, and feel good about the strength she shows working with goal-directed behaviors to move in a direction that supports improved emotional health.  Goal review and progress/challenges noted with patient.  Next appointment within 2 weeks.  This record has been created using [employer].  Chart creation errors have been sought, but may not always have been located and corrected.  Such creation errors do not reflect on the standard of medical care provided.  Shanon Ace, LCSW

## 2021-09-30 ENCOUNTER — Ambulatory Visit: Payer: Medicare Other | Admitting: Psychiatry

## 2021-10-04 ENCOUNTER — Other Ambulatory Visit: Payer: Self-pay | Admitting: Psychiatry

## 2021-10-04 ENCOUNTER — Other Ambulatory Visit: Payer: Self-pay | Admitting: Internal Medicine

## 2021-10-04 DIAGNOSIS — F411 Generalized anxiety disorder: Secondary | ICD-10-CM

## 2021-10-04 DIAGNOSIS — K219 Gastro-esophageal reflux disease without esophagitis: Secondary | ICD-10-CM

## 2021-10-05 NOTE — Telephone Encounter (Signed)
Filled 1/4 appt on 2/1

## 2021-10-06 ENCOUNTER — Other Ambulatory Visit: Payer: Self-pay

## 2021-10-06 ENCOUNTER — Ambulatory Visit: Payer: No Typology Code available for payment source | Admitting: Psychiatry

## 2021-10-06 ENCOUNTER — Encounter: Payer: Self-pay | Admitting: Psychiatry

## 2021-10-06 VITALS — BP 119/81 | HR 99

## 2021-10-06 DIAGNOSIS — F319 Bipolar disorder, unspecified: Secondary | ICD-10-CM | POA: Diagnosis not present

## 2021-10-06 DIAGNOSIS — F900 Attention-deficit hyperactivity disorder, predominantly inattentive type: Secondary | ICD-10-CM | POA: Diagnosis not present

## 2021-10-06 DIAGNOSIS — F411 Generalized anxiety disorder: Secondary | ICD-10-CM | POA: Diagnosis not present

## 2021-10-06 MED ORDER — CHLORDIAZEPOXIDE HCL 10 MG PO CAPS: 10.0000 mg | ORAL_CAPSULE | Freq: Every evening | ORAL | 1 refills | Status: DC

## 2021-10-06 MED ORDER — CARIPRAZINE HCL 3 MG PO CAPS: 3.0000 mg | ORAL_CAPSULE | Freq: Every day | ORAL | 1 refills | Status: DC

## 2021-10-06 MED ORDER — CARBAMAZEPINE 100 MG PO CHEW: CHEWABLE_TABLET | ORAL | 2 refills | Status: DC

## 2021-10-06 NOTE — Patient Instructions (Signed)
Increase clonidine to 1/2 tablet twice a day for a week.   If anxiety is still up problem try increasing clonidine to one half in the morning, one half with the evening meal, and one half at bedtime

## 2021-10-06 NOTE — Progress Notes (Signed)
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 1 week then 1/2 tablet in the morning and 1 tablet at night (Patient taking differently: 1/2 tab at night) 45 tablet 1   dexmethylphenidate (FOCALIN) 10 MG tablet TAKE 1 AND 1/2 TABLETS BY MOUTH TWICE DAILY. 90 tablet 0   Diclofenac Sodium 3 % GEL Apply 1 application topically 2 (two) times daily. 100 g 0   ferrous gluconate (FERGON) 324 MG tablet Take 324 mg by mouth daily with breakfast.     lamoTRIgine (LAMICTAL) 200 MG tablet TAKE (1) TABLET BY MOUTH TWICE DAILY. 60 tablet 0   lithium carbonate 150 MG capsule Take 1 capsule (150 mg total) by mouth at bedtime. 1 tab at bedtime (Patient taking differently: Take 150 mg by mouth at bedtime.) 90 capsule 1   Melatonin 10 MG CAPS Take by mouth at bedtime as needed.     pantoprazole (PROTONIX) 40 MG tablet TAKE (1) TABLET BY MOUTH ONCE DAILY. 90 tablet 0   predniSONE (DELTASONE) 50 MG tablet Take one tablet daily for 5 days. 5 tablet 0   carbamazepine (TEGRETOL) 100 MG chewable tablet CHEW 1 TABLET BY MOUTH AT BEDTIME. 30 tablet 2   cariprazine (VRAYLAR) 3 MG capsule Take 1 capsule  (3 mg total) by mouth daily. 30 capsule 1   chlordiazePOXIDE (LIBRIUM) 10 MG capsule Take 1 capsule (10 mg total) by mouth at bedtime. 30 capsule 1   promethazine-dextromethorphan (PROMETHAZINE-DM) 6.25-15 MG/5ML syrup Take 5 mLs by mouth 4 (four) times daily as needed. (Patient not taking: Reported on 10/06/2021) 100 mL 0   No current facility-administered medications for this visit.    Medication Side Effects: Other: tremor and weight gain.   Dyskinesia appears better  SE bettter than they were.  Balance problems intermittently  Allergies:  Allergies  Allergen Reactions   Azithromycin Anaphylaxis   Penicillins Anaphylaxis    DID THE REACTION INVOLVE: Swelling of the face/tongue/throat, SOB, or low BP? Yes Sudden or severe rash/hives, skin peeling, or the inside of the mouth or nose? Yes Did it require medical treatment? No When did it last happen?       If all above answers are "NO", may proceed with cephalosporin use.   Adhesive [Tape] Other (See Comments)    On bandaids    Past Medical History:  Diagnosis Date   ADD (attention deficit disorder)    Allergy    Seasonal   Anemia  History of GI blood loss   Anxiety    Arthritis    Atrophy of vagina 10/07/2020   Bipolar 1 disorder (HCC)    Cancer ([place])    Colon polyps    Depression    Diabetes mellitus ([place])    Edema, lower extremity    Epistaxis    Around 2011 or 2012, required cauterization.    Esophageal stricture    Fracture of superior pubic ramus (HCC) 11/28/2018   GERD (gastroesophageal reflux disease)    Headache(784.0)    Hyperlipidemia    Interstitial cystitis    Joint pain    Lactose intolerance    Lung cancer ([place]) 2002   Obesity    Osteoarthritis    Palpitations    Sleep apnea    Doesn't use a CPAP   Suicidal ideation 01/20/2020   Swallowing difficulty     Family History  Problem Relation Age of Onset   Arthritis Mother    Hearing loss Mother    Hyperlipidemia Mother    Hypertension Mother     Depression Mother    Anxiety disorder Mother    Obesity Mother    Sudden death Mother    Hypertension Father    Diabetes Mellitus II Father    Heart disease Father    Arthritis Father    Cancer Father        Brain   COPD Father    Diabetes Father    Hyperlipidemia Father    Sleep apnea Father    Early death Sister        Aneroxia/Bulimic   Depression Brother    Early death Proofreader Accident   Depression Daughter    Drug abuse Daughter    Heart disease Daughter    Hypertension Daughter    Stroke Maternal Grandmother    Hypertension Maternal Grandmother    Arthritis Maternal Grandfather    Heart attack Maternal Grandfather    Hearing loss Maternal Grandfather    Colon cancer Neg Hx    Esophageal cancer Neg Hx    Rectal cancer Neg Hx     Social History   Socioeconomic History   Marital status: Married    Spouse name: Not on file   Number of children: 1   Years of education: Not on file   Highest education level: Not on file  Occupational History   Occupation: Admin. assistant  Tobacco Use   Smoking status: Never   Smokeless tobacco: Never  Vaping Use   Vaping Use: Never used  Substance and Sexual Activity   Alcohol use: Yes    Alcohol/week: 1.0 standard drink    Types: 1 Glasses of wine per week    Comment: Moderate   Drug use: No   Sexual activity: Yes  Other Topics Concern   Not on file  Social History Narrative   Pt lives in [place] with husband Lanny Hurst.  Followed by Dr. Clovis Pu for psychiatry and Rinaldo Cloud for therapy.   Social Determinants of Health   Financial Resource Strain: Medium Risk   Difficulty of Paying Living Expenses: Somewhat hard  Food Insecurity: No Food Insecurity   Worried About Charity fundraiser in the Last Year: Never true   Ran Out of Food in the Last Year: Never true  Transportation Needs: No Transportation Needs   Lack of Transportation (Medical): No   Lack of Transportation (Non-Medical): No  Physical  Activity: Inactive   Days of Exercise  per Week: 0 days   Minutes of Exercise per Session: 0 min  Stress: No Stress Concern Present   Feeling of Stress : Only a little  Social Connections: Moderately Integrated   Frequency of Communication with Friends and Family: More than three times a week   Frequency of Social Gatherings with Friends and Family: More than three times a week   Attends Religious Services: More than 4 times per year   Active Member of [employer] or Organizations: No   Attends Archivist Meetings: Never   Marital Status: Married  Human resources officer Violence: Not At Risk   Fear of Current or Ex-Partner: No   Emotionally Abused: No   Physically Abused: No   Sexually Abused: No    Past Medical History, Surgical history, Social history, and Family history were reviewed and updated as appropriate.   Please see review of systems for further details on the patient's review from today.   Objective:   Physical Exam:  BP 119/81    Pulse 99   Physical Exam Constitutional:      General: She is not in acute distress. Musculoskeletal:        General: No deformity.  Neurological:     Mental Status: She is alert and oriented to person, place, and time.     Cranial Nerves: No dysarthria.     Motor: Tremor present.     Coordination: Coordination normal.     Comments: Mild resting R hand rotational tremor Fidgety mildy with feet Very mild lip pursing.  Psychiatric:        Attention and Perception: Attention and perception normal. She does not perceive auditory or visual hallucinations.        Mood and Affect: Mood is anxious. Mood is not depressed. Affect is not labile, blunt, angry or inappropriate.        Speech: Speech normal. Speech is not rapid and pressured.        Behavior: Behavior normal. Behavior is not agitated. Behavior is cooperative.        Thought Content: Thought content normal. Thought content is not paranoid or delusional. Thought content does not  include homicidal or suicidal ideation. Thought content does not include suicidal plan.        Cognition and Memory: Cognition and memory normal.        Judgment: Judgment normal.     Comments: Insight intact Less mood cycling but still anxious.     Lab Review:     Component Value Date/Time   NA 141 08/12/2021 0911   K 4.4 08/12/2021 0911   CL 101 08/12/2021 0911   CO2 24 08/12/2021 0911   GLUCOSE 90 08/12/2021 0911   GLUCOSE 102 (H) 01/19/2020 1158   BUN 17 08/12/2021 0911   CREATININE 0.91 08/12/2021 0911   CALCIUM 9.5 08/12/2021 0911   PROT 6.7 08/12/2021 0911   ALBUMIN 4.3 08/12/2021 0911   AST 19 08/12/2021 0911   ALT 11 08/12/2021 0911   ALKPHOS 63 08/12/2021 0911   BILITOT 0.2 08/12/2021 0911   GFRNONAA 83 03/02/2020 1433   GFRAA 96 03/02/2020 1433       Component Value Date/Time   WBC 7.4 08/12/2021 0911   WBC 7.4 01/19/2020 1158   RBC 4.70 08/12/2021 0911   RBC 4.19 01/19/2020 1158   HGB 14.2 08/12/2021 0911   HCT 43.7 08/12/2021 0911   PLT 277 08/12/2021 0911   MCV 93 08/12/2021 0911   MCH 30.2 08/12/2021 0911  MCH 30.3 01/19/2020 1158   MCHC 32.5 08/12/2021 0911   MCHC 32.1 01/19/2020 1158   RDW 12.1 08/12/2021 0911   LYMPHSABS 2.8 08/12/2021 0911   MONOABS 0.7 04/04/2019 1004   EOSABS 0.4 08/12/2021 0911   BASOSABS 0.0 08/12/2021 0911  Vitamin D level 33 on 10K units daily on 12/4/`9 Increased to prescription vitamin d 50K units Monday, Wed, Friday.  Rx sent in.   Lithium Lvl  Date Value Ref Range Status  10/21/2018 0.18 (L) 0.60 - 1.20 mmol/L Final    Comment:    Performed at [facility], [address], [place], [place] [address]     No results found for: PHENYTOIN, PHENOBARB, VALPROATE, CBMZ   .res Assessment: Plan:    Bipolar I disorder with rapid cycling ([place]) - Plan: cariprazine (VRAYLAR) 3 MG capsule, carbamazepine (TEGRETOL) 100 MG chewable tablet  Generalized anxiety disorder - Plan: chlordiazePOXIDE (LIBRIUM) 10 MG  capsule  Attention deficit hyperactivity disorder (ADHD), predominantly inattentive type  Vi has chronic rapid cycling bipolar disorder which is chronically unstable and has been difficult to control.  The rapid cycling is making it difficult to control frequency of depressive episodes and the anxiety as well.  We have typically had to make frequent med changes.  We have had to reduce mood stabilizer dosages including Vraylar and Equetro she is very sensitive to carbamazepine because higher doses cause dizziness.    Since last visit rapid cycled out of mania and into depression.  More cycling with less Vraylar.  No easy solution to this.  She's tried all FDA approved meds for bipolar depression.  Failed many other meds too. Since the last appointment she has more rapic cycled into depression  Disc ECT. Only FDA approved option left. She was denied patient assistance with Vraylar.   She is more depressed at the moment but it is likely that the added stress with her husband is a contributing factor and unlikely that any immediate med change is going to help.  She is failed multiple prior meds for depression.    Only other option reasonable is ECT  Wear a mouthguard bc of teeth grinding.  It' smuch better off [place].  Prone to UTI and may be causing confusion discussed.  Had confirmed UTI recently.  Since last year when she was stable she started having hypomanic symptoms again.  We increased Equetro back to 300 mg nightly but she she has had balance problems. It had stopped the manic symptoms but then she started having balance problems again and we had to switch it back to Equetro 200 and add carbamazepine immediate release 100 nightly..  intolerant of Equetro 300 mg HS. After New Year switch Equetro to Carbatrol in hopes of better coverage.  Out of work ADD less of a problem.  Discussed the risk of stimulants including that that could contribute to mood instability and mood swings.     She has a high residual anxiety.  It has been impossible to control all of her symptoms simultaneously without causing side effects. Failed various meds.  She agrees.  Continue the following Discussed side effects of each medicine. Continue   Focalin XR 20 mg  To IR 15 mg BID DT Cost and off label for depression  Try to spread this out if possible for mood.  Option Switch Equetro to Carbatrol 200 in hopes for better $ Carbamazepine immediate release 100 mg nightly Lamotrigine 200 mg twice daily Lithium 150 mg nightly Continue Librium 25 HS bc  needed for sleep Vraylar 1.5 mg daily Using samples Disc risk worsening TD and tremor. Failed all reasonable alternatives for anxiety.  Option viibryd Librium to 10 mg HS DT ? Effect. Clonidine off label for irritability and anxiety 0.05 mg BID Increase clonidine to 1/2 tablet twice a day for a week.   If anxiety is still up problem try increasing clonidine to one half in the morning, one half with the evening meal, and one half at bedtime OK Benadryl but disc risk.    Discussed potential metabolic side effects associated with atypical antipsychotics, as well as potential risk for movement side effects. Advised pt to contact office if movement side effects occur.  She may be having some mild TD with toe movement and grimacing.  Disc this in detail.  At this point it's tolerable.  . Suspect it.  "I don't realize it". But sometimes others notice  Option reduce Librium but she has no hangover No additonal sedatives during the day which fight the benefit of the stimulants and be more likely to trigger depression and poor activitty level. We discussed the short-term risks associated with benzodiazepines including sedation and increased fall risk among others.  Discussed long-term side effect risk including dependence, potential withdrawal symptoms, and the potential eventual dose-related risk of dementia.  But recent studies from 2020 dispute this  association between benzodiazepines and dementia risk. Newer studies in 2020 do not support an association with dementia.  Disc SE meds and this is heightened by the complication of necessary polypharmacy.  Supportive therapy in terms of dealing with husband's addiction and now new dx metastatic prostate CA.Marland Kitchen Disc poss PT job to help with socialization. Sleep hygiene disc and not layingin bed awake for long periods.  Requires frequent FU DT chronic instability.  Wants to schedule monthly.  FU 4 weeks  Lynder Parents MD, DFAPA  Please see After Visit Summary for patient specific instructions.    Future Appointments  Date Time Provider [place]  10/14/2021 10:00 AM Shanon Ace, LCSW [facility] None  10/28/2021 10:00 AM Shanon Ace, LCSW [facility] None  11/01/2021 10:30 AM Mcarthur Rossetti, MD [facility] None  11/03/2021  2:00 PM Cottle, Billey Co., MD [facility] None  11/11/2021 10:00 AM Shanon Ace, LCSW [facility] None  11/25/2021 10:00 AM Shanon Ace, LCSW [facility] None  12/09/2021 10:00 AM Shanon Ace, LCSW [facility] None  12/23/2021 10:00 AM Shanon Ace, LCSW [facility] None  02/21/2022 11:00 AM Lindell Spar, MD [facility]    No orders of the defined types were placed in this encounter.      -------------------------------

## 2021-10-12 ENCOUNTER — Other Ambulatory Visit: Payer: Self-pay | Admitting: Psychiatry

## 2021-10-12 ENCOUNTER — Other Ambulatory Visit: Payer: Self-pay | Admitting: Internal Medicine

## 2021-10-12 DIAGNOSIS — F319 Bipolar disorder, unspecified: Secondary | ICD-10-CM

## 2021-10-12 DIAGNOSIS — E7849 Other hyperlipidemia: Secondary | ICD-10-CM

## 2021-10-14 ENCOUNTER — Ambulatory Visit: Payer: No Typology Code available for payment source | Admitting: Psychiatry

## 2021-10-14 ENCOUNTER — Other Ambulatory Visit: Payer: Self-pay

## 2021-10-14 DIAGNOSIS — F319 Bipolar disorder, unspecified: Secondary | ICD-10-CM | POA: Diagnosis not present

## 2021-10-14 NOTE — Progress Notes (Signed)
[facility] Counselor/Therapist Progress Note  Patient ID: Megan Whitney, MRN: [id number],    Date: 10/14/2021  Time Spent: 50 minutes   Treatment Type: Individual Therapy  Reported Symptoms:  Irritability, anger,anxiety, depression decreased some  Mental Status Exam:  Appearance:   Casual     Behavior:  Appropriate, Sharing, and motivation some lower  Motor:  Normal  Speech/Language:   Clear and Coherent  Affect:  Anxious, irritable  Mood:  anxious, irritable, and some anger  Thought process:  goal directed  Thought content:    Obsessions and overanalyzing  Sensory/Perceptual disturbances:    WNL  Orientation:  oriented to person, place, time/date, situation, day of week, month of year, year, and stated date of Feb. 9, 2023  Attention:  Good  Concentration:  Good and Fair  Memory:  WNL  Fund of knowledge:   Good  Insight:    Good and Fair  Judgment:   Fair  Impulse Control:  Fair   Risk Assessment: Danger to Self:  No Self-injurious Behavior: No Danger to Others: No Duty to Warn:no Physical Aggression / Violence:No  Access to Firearms a concern: No  Gang Involvement:No   Subjective:  Patient in today reporting anxiety, depression decreased, anger, and irritability the past week but feeling some better now.  Symptoms primarily related to marital, personal, and husband's cancer issues.  "More on edge today and not sure why but I had a bad experience yesterday more related to some prejudice she eventually acknowledged and states "that's just the way I am and can't change it." Processed those feelings some and was able to move forward in looking for more positives which is usually difficult for patient.  Worked more today on being able to help herself some when her mood does tend to be lower and she responded well to this.  Seeing granddaughter more often which is good. Husband more recently is having better days as he copes with cancer and "when he does good I do good."  No tearfulness today. Husband has stopped drinking again more recently. Trying to take occasional breaks from her phone and is using books more to fill some of her time.  Patient denies any current SI and reports actually handling some stress a little better more recently.  States she is still trying not to assume worst case scenarios as often and is being more mindful of this outside of sessions.  Interventions: Solution-Oriented/Positive Psychology, Ego-Supportive, and Insight-Oriented  Treatment Goal Plan of Care: Patient not signing tx plan on computer screen due to [place]. Treatment Goals: Goals remain on plan as patient works on strategies to meet her goals.  Progress is noted each visit in "Progress" section of goal plan. Long Term Goal: Reduce overall level, frequency, and intensity of the anxiety so that daily functioning is not impaired. Short Term Goal: 1.Increase understanding of the beliefs and messages that produce the worry and anxiety. Strategies: 1.Help client develop reality-based positive cognitive messages/self-talk. 2. Develop a "coping card" or other reminder which coping strategies are recorded for patient's later use.   Diagnosis:   ICD-10-CM   1. Bipolar I disorder with rapid cycling ([place])  F31.9      Plan:  Patient today showing motivation and active involvement in session processing some of her anxious/angry/irritable thoughts and feelings in relation to an incident that happened recently.  After venting those concerns, she leveled out and worked well on being able to continue having some positive feelings like she  has had the past couple of weeks.  Is keeping herself more occupied at times with things that she enjoys including reading and talking with friends and being with granddaughter.  Remaining on her medication as prescribed and is "hoping to be able to hang onto positive feelings for longer periods of time ".  Continue to support her interactions with extended  family and friends as this is an integral part of her own self-care. Encouraged patient in her trying to practice more positive behaviors including: Recognizing and reflecting more on her progress, for every negative thought create 2 positives, believing in herself and her ability to make changes in the midst of difficult circumstances, staying in the present focusing on what she can change, practice consistent positive self talk, looking more for what might go right versus wrong, taking breaks from her electronics occasionally each day, increase her patience when things do not go as planned or expected, interrupt anxious/fearful/depressive/negative thoughts to replace with more realistic thoughts, remain on her prescribed medications, staying in touch with supportive people, finding the positives within herself and be able to list them, staying in contact with her church friends that have been supportive, get outside some each day as she is able, spend time playing with her dogs as they are very therapeutic for her, work on the communication skills especially active listening and "how I say what I say" that we have worked on in sessions, stop assuming worst case scenarios, stop self negating, decrease overthinking and over analyzing, reverse her tendency to second guess people and situations, and realize the strength she shows working with goal-directed behaviors to move in a direction that supports improved emotional health and overall wellbeing.  Goal review and progress/challenges noted with patient.  Next appointment within 2 weeks.  This record has been created using [employer].  Chart creation errors have been sought, but may not always have been located and corrected.  Such creation errors do not reflect on the standard of medical care provided.   Shanon Ace, LCSW

## 2021-10-20 ENCOUNTER — Encounter: Payer: No Typology Code available for payment source | Admitting: Vascular Surgery

## 2021-10-21 ENCOUNTER — Other Ambulatory Visit: Payer: Self-pay | Admitting: Psychiatry

## 2021-10-21 DIAGNOSIS — F314 Bipolar disorder, current episode depressed, severe, without psychotic features: Secondary | ICD-10-CM

## 2021-10-22 ENCOUNTER — Encounter: Payer: Self-pay | Admitting: Internal Medicine

## 2021-10-22 ENCOUNTER — Ambulatory Visit (INDEPENDENT_AMBULATORY_CARE_PROVIDER_SITE_OTHER): Payer: No Typology Code available for payment source | Admitting: Internal Medicine

## 2021-10-22 ENCOUNTER — Other Ambulatory Visit: Payer: Self-pay

## 2021-10-22 ENCOUNTER — Telehealth: Payer: Self-pay

## 2021-10-22 VITALS — BP 126/86 | HR 84 | Resp 18 | Ht 60.0 in | Wt 181.0 lb

## 2021-10-22 DIAGNOSIS — K219 Gastro-esophageal reflux disease without esophagitis: Secondary | ICD-10-CM | POA: Diagnosis not present

## 2021-10-22 DIAGNOSIS — R059 Cough, unspecified: Secondary | ICD-10-CM | POA: Diagnosis not present

## 2021-10-22 MED ORDER — BENZONATATE 100 MG PO CAPS: 100.0000 mg | ORAL_CAPSULE | Freq: Two times a day (BID) | ORAL | 0 refills | Status: DC | PRN

## 2021-10-22 NOTE — Assessment & Plan Note (Signed)
Usually well-controlled, on Pantoprazole 40 mg QD -and had advised to take additional dose if needed Advised to avoid hot or spicy food Try to be sitting upright at least for 30 minutes after meal

## 2021-10-22 NOTE — Telephone Encounter (Signed)
Appt made for pt

## 2021-10-22 NOTE — Progress Notes (Signed)
Acute Office Visit  Subjective:    Patient ID: Megan Whitney, female    DOB: February 16, 1956, 66 y.o.   MRN: [id number]  Chief Complaint  Patient presents with   Aspiration    Pt had episode of aspiration on 10-21-21 from severe acid reflux was coughing couldn't sleep now has a small amount of pain in chest only when coughing     HPI Patient is in today for concern for aspiration.  She had epigastric pain with heartburn last night.  She denies any spicy or hard food intake yesterday.  She does report that she ate later than her usual time and went to bed after it.  She had severe coughing spells leading to chest burning after it.  Her symptoms are better since this morning.  She denies any fever, chills, dyspnea or wheezing currently.  Denies any episode of hemoptysis or vomiting.  Past Medical History:  Diagnosis Date   ADD (attention deficit disorder)    Allergy    Seasonal   Anemia    History of GI blood loss   Anxiety    Arthritis    Atrophy of vagina 10/07/2020   Bipolar 1 disorder (HCC)    Cancer (HCC)    Colon polyps    Depression    Diabetes mellitus ([place])    Edema, lower extremity    Epistaxis    Around 2011 or 2012, required cauterization.    Esophageal stricture    Fracture of superior pubic ramus (HCC) 11/28/2018   GERD (gastroesophageal reflux disease)    Headache(784.0)    Hyperlipidemia    Interstitial cystitis    Joint pain    Lactose intolerance    Lung cancer ([place]) 2002   Obesity    Osteoarthritis    Palpitations    Sleep apnea    Doesn't use a CPAP   Suicidal ideation 01/20/2020   Swallowing difficulty     Past Surgical History:  Procedure Laterality Date   BALLOON DILATION  05/16/2012   Procedure: BALLOON DILATION;  Surgeon: Inda Castle, MD;  Location: [facility];  Service: Endoscopy;  Laterality: N/A;   BUNIONECTOMY  2011   COLONOSCOPY     ENTEROSCOPY  05/16/2012   Procedure: ENTEROSCOPY;  Surgeon: Inda Castle, MD;  Location: [facility];  Service: Endoscopy;  Laterality: N/A;   JOINT REPLACEMENT     right shoulder durgery 25 yrs ago  [place]  2006, 2008   bilateral   [place]  2002   lung cancer    Family History  Problem Relation Age of Onset   Arthritis Mother    Hearing loss Mother    Hyperlipidemia Mother    Hypertension Mother    Depression Mother    Anxiety disorder Mother    Obesity Mother    Sudden death Mother    Hypertension Father    Diabetes Mellitus II Father    Heart disease Father    Arthritis Father    Cancer Father        Brain   COPD Father    Diabetes Father    Hyperlipidemia Father    Sleep apnea Father    Early death Sister        Aneroxia/Bulimic   Depression Brother    Early death Proofreader Accident   Depression Daughter    Drug abuse Daughter  Heart disease Daughter    Hypertension Daughter    Stroke Maternal Grandmother    Hypertension Maternal Grandmother    Arthritis Maternal Grandfather    Heart attack Maternal Grandfather    Hearing loss Maternal Grandfather    Colon cancer Neg Hx    Esophageal cancer Neg Hx    Rectal cancer Neg Hx     Social History   Socioeconomic History   Marital status: Married    Spouse name: Not on file   Number of children: 1   Years of education: Not on file   Highest education level: Not on file  Occupational History   Occupation: Admin. assistant  Tobacco Use   Smoking status: Never   Smokeless tobacco: Never  Vaping Use   Vaping Use: Never used  Substance and Sexual Activity   Alcohol use: Yes    Alcohol/week: 1.0 standard drink    Types: 1 Glasses of wine per week    Comment: Moderate   Drug use: No   Sexual activity: Yes  Other Topics Concern   Not on file  Social History Narrative   Pt lives in [place] with husband Lanny Hurst.  Followed by Dr. Clovis Pu for psychiatry and Rinaldo Cloud for therapy.   Social Determinants of Health   Financial  Resource Strain: Medium Risk   Difficulty of Paying Living Expenses: Somewhat hard  Food Insecurity: No Food Insecurity   Worried About Charity fundraiser in the Last Year: Never true   Ran Out of Food in the Last Year: Never true  Transportation Needs: No Transportation Needs   Lack of Transportation (Medical): No   Lack of Transportation (Non-Medical): No  Physical Activity: Inactive   Days of Exercise per Week: 0 days   Minutes of Exercise per Session: 0 min  Stress: No Stress Concern Present   Feeling of Stress : Only a little  Social Connections: Moderately Integrated   Frequency of Communication with Friends and Family: More than three times a week   Frequency of Social Gatherings with Friends and Family: More than three times a week   Attends Religious Services: More than 4 times per year   Active Member of [employer] or Organizations: No   Attends Archivist Meetings: Never   Marital Status: Married  Human resources officer Violence: Not At Risk   Fear of Current or Ex-Partner: No   Emotionally Abused: No   Physically Abused: No   Sexually Abused: No    Outpatient Medications Prior to Visit  Medication Sig Dispense Refill   acetaminophen (TYLENOL) 650 MG CR tablet Take 1,300 mg by mouth as needed for pain.     atorvastatin (LIPITOR) 20 MG tablet TAKE (1) TABLET BY MOUTH AT BEDTIME. 90 tablet 0   CALCIUM PO Take by mouth.     carbamazepine (TEGRETOL) 100 MG chewable tablet CHEW 1 TABLET BY MOUTH AT BEDTIME. 30 tablet 2   cariprazine (VRAYLAR) 3 MG capsule Take 1 capsule (3 mg total) by mouth daily. 30 capsule 1   chlordiazePOXIDE (LIBRIUM) 10 MG capsule Take 1 capsule (10 mg total) by mouth at bedtime. 30 capsule 1   cholecalciferol (VITAMIN D) 25 MCG (1000 UNIT) tablet Take 1,000 Units by mouth daily.     cloNIDine (CATAPRES) 0.1 MG tablet 1/2 tablet at night for 1 week, then 1 tablet at night for 1 week then 1/2 tablet in the morning and 1 tablet at night (Patient taking  differently: 1/2 tab at night) 45 tablet 1  dexmethylphenidate (FOCALIN) 10 MG tablet TAKE 1 AND 1/2 TABLETS BY MOUTH TWICE DAILY. 90 tablet 0   Diclofenac Sodium 3 % GEL Apply 1 application topically 2 (two) times daily. 100 g 0   ferrous gluconate (FERGON) 324 MG tablet Take 324 mg by mouth daily with breakfast.     lamoTRIgine (LAMICTAL) 200 MG tablet TAKE (1) TABLET BY MOUTH TWICE DAILY. 60 tablet 0   lithium carbonate 150 MG capsule Take 1 capsule (150 mg total) by mouth at bedtime. 1 tab at bedtime (Patient taking differently: Take 150 mg by mouth at bedtime.) 90 capsule 1   Melatonin 10 MG CAPS Take by mouth at bedtime as needed.     pantoprazole (PROTONIX) 40 MG tablet TAKE (1) TABLET BY MOUTH ONCE DAILY. 90 tablet 0   promethazine-dextromethorphan (PROMETHAZINE-DM) 6.25-15 MG/5ML syrup Take 5 mLs by mouth 4 (four) times daily as needed. 100 mL 0   predniSONE (DELTASONE) 50 MG tablet Take one tablet daily for 5 days. 5 tablet 0   No facility-administered medications prior to visit.    Allergies  Allergen Reactions   Azithromycin Anaphylaxis   Penicillins Anaphylaxis    DID THE REACTION INVOLVE: Swelling of the face/tongue/throat, SOB, or low BP? Yes Sudden or severe rash/hives, skin peeling, or the inside of the mouth or nose? Yes Did it require medical treatment? No When did it last happen?       If all above answers are "NO", may proceed with cephalosporin use.   Adhesive [Tape] Other (See Comments)    On bandaids    Review of Systems  Constitutional:  Negative for chills and fever.  HENT:  Negative for congestion, sinus pressure, sinus pain and sore throat.   Eyes:  Negative for pain and discharge.  Respiratory:  Positive for cough. Negative for shortness of breath.   Cardiovascular:  Negative for palpitations.  Gastrointestinal:  Positive for abdominal pain. Negative for diarrhea, nausea and vomiting.  Endocrine: Negative for polydipsia and polyuria.  Genitourinary:   Negative for dysuria and hematuria.  Musculoskeletal:  Negative for neck pain and neck stiffness.  Skin:  Negative for rash.  Neurological:  Negative for dizziness and weakness.  Psychiatric/Behavioral:  Negative for agitation and behavioral problems.       Objective:    Physical Exam Vitals reviewed.  Constitutional:      General: She is not in acute distress.    Appearance: She is obese. She is not diaphoretic.  HENT:     Head: Normocephalic and atraumatic.     Nose: Nose normal. No nasal tenderness.     Right Sinus: No maxillary sinus tenderness.     Left Sinus: No maxillary sinus tenderness.     Mouth/Throat:     Mouth: Mucous membranes are moist.  Eyes:     General: No scleral icterus.    Extraocular Movements: Extraocular movements intact.  Cardiovascular:     Rate and Rhythm: Normal rate and regular rhythm.     Pulses: Normal pulses.     Heart sounds: Normal heart sounds. No murmur heard. Pulmonary:     Breath sounds: Normal breath sounds. No wheezing or rales.  Abdominal:     Palpations: Abdomen is soft.     Tenderness: There is no abdominal tenderness.  Musculoskeletal:     Cervical back: Neck supple. No tenderness.     Right lower leg: No edema.     Left lower leg: No edema.  Skin:    General: Skin is  warm.     Findings: No rash.  Neurological:     General: No focal deficit present.     Mental Status: She is alert and oriented to person, place, and time.     Sensory: No sensory deficit.     Motor: No weakness.     Gait: Gait abnormal.     Comments: Resting tremor of right hand  Psychiatric:        Behavior: Behavior normal.        Thought Content: Thought content normal.    BP 126/86 (BP Location: Right Arm, Patient Position: Sitting, Cuff Size: Normal)    Pulse 84    Resp 18    Ht 5' (1.524 m)    Wt 181 lb (82.1 kg)    SpO2 99%    BMI 35.35 kg/m  Wt Readings from Last 3 Encounters:  10/22/21 181 lb (82.1 kg)  09/01/21 180 lb 1.3 oz (81.7 kg)   08/17/21 179 lb 1.9 oz (81.2 kg)        Assessment & Plan:   Problem List Items Addressed This Visit       Digestive   GERD (gastroesophageal reflux disease) - Primary    Usually well-controlled, on Pantoprazole 40 mg QD -and had advised to take additional dose if needed Advised to avoid hot or spicy food Try to be sitting upright at least for 30 minutes after meal      Other Visit Diagnoses     Cough, unspecified type     Coughing spells likely due to GERD, on pantoprazole currently Lung sounds clear, air entry equal b/l No concern for aspiration pneumonia currently in the absence of systemic symptoms Tessalon as needed for cough   Relevant Medications   benzonatate (TESSALON) 100 MG capsule        Meds ordered this encounter  Medications   benzonatate (TESSALON) 100 MG capsule    Sig: Take 1 capsule (100 mg total) by mouth 2 (two) times daily as needed for cough.    Dispense:  20 capsule    Refill:  0     Leesa Leifheit Keith Rake, MD

## 2021-10-22 NOTE — Telephone Encounter (Addendum)
Prior Authorization initiated thru Cover My Meds   [employer] Medicare Part D Dexmethylphenidate 10 mg #90 tablets   Prior Authorization approved 09/05/2021 - 10/22/2022  Dexmethylphenidate HCL 10 mg #90 tablets  [facility] [employer]  Patient notified.

## 2021-10-22 NOTE — Telephone Encounter (Incomplete Revision)
Prior Authorization initiated thru Cover My Meds   [employer] Medicare Part D Dexmethylphenidate 10 mg #90 tablets

## 2021-10-22 NOTE — Patient Instructions (Signed)
Please take additional dose of Pantoprazole if needed for symptoms such as upper abdominal pain, burning in abdomen, etc.  Please take Benzonatate as needed for cough.

## 2021-10-28 ENCOUNTER — Ambulatory Visit (INDEPENDENT_AMBULATORY_CARE_PROVIDER_SITE_OTHER): Payer: No Typology Code available for payment source | Admitting: Psychiatry

## 2021-10-28 DIAGNOSIS — F319 Bipolar disorder, unspecified: Secondary | ICD-10-CM

## 2021-10-28 NOTE — Progress Notes (Signed)
[facility] Counselor/Therapist Progress Note  Patient ID: Megan Whitney, MRN: [id number],    Date: 10/28/2021  Time Spent: 50 minutes   Virtual Visit via Telehealth Note: MyChart Video session Connected with patient by a telemedicine/telehealth application, with their informed consent, and verified patient privacy and that I am speaking with the correct person using two identifiers. I discussed the limitations, risks, security and privacy concerns of performing psychotherapy and the availability of in person appointments. I also discussed with the patient that there may be a patient responsible charge related to this service. The patient expressed understanding and agreed to proceed. I discussed the treatment planning with the patient. The patient was provided an opportunity to ask questions and all were answered. The patient agreed with the plan and demonstrated an understanding of the instructions. The patient was advised to call  our office if  symptoms worsen or feel they are in a crisis state and need immediate contact.   Therapist Location: home office Patient Location: home   Treatment Type: Individual Therapy  Reported Symptoms:  anxiety, depression, some SI at times but not currently, tearfulness, negative thoughts, low motivation  Mental Status Exam:  Appearance:   Casual     Behavior:  Appropriate and Sharing  Motor:  N/a  telehealth  Speech/Language:   Clear and Coherent  Affect:  Depressed and anxious  Mood:  angry, anxious, and depressed  Thought process:  goal directed  Thought content:    overthinking  Sensory/Perceptual disturbances:    WNL  Orientation:  oriented to person, place, time/date, situation, day of week, month of year, year, and stated date of Feb. 23, 2023  Attention:  Good  Concentration:  Good  Memory:  WNL  Fund of knowledge:   Good  Insight:    Good and Fair  Judgment:   Good  Impulse Control:  Good   Risk Assessment: Danger to Self:   No Self-injurious Behavior: No Danger to Others: No Duty to Warn:no Physical Aggression / Violence:No  Access to Firearms a concern: No  Gang Involvement:No   Subjective:   Patient today reporting symptoms of anxiety, depression, some recent SI but denies any currently, some tearfulness, negative thoughts, lower motivation. Actually did very well in session today talking through some anticipatory grief re: husband's cancer and not responding well to any interventions. States she and husband talked after he spoke with his therapist and husband told patient "I'm dying and I don't want to keep trying options that aren't helping, I want to do some things I enjoy while I can rather that keep pursuing options that aren't helping."  This came as a surprise to patient and she had difficult time understanding this initially but is beginning to try and understand where husband is coming from, and his feeling about quality of life and wanting to do some things he enjoys "including some bucket-list items" while he can still enjoy them.  This discussion seemed helpful to patient and she was more calm and grounded by session end. Denies any current SI and knows to contact our office or the hospital ED if SI returns and she feels she could act on them, and patient agrees with this. To work out an appt for her next week.   Interventions: Solution-Oriented/Positive Psychology, Ego-Supportive, and Insight-Oriented  Treatment Goal Plan of Care: Patient not signing tx plan on computer screen due to [place]. Treatment Goals: Goals remain on plan as patient works on strategies to meet her  goals.  Progress is noted each visit in "Progress" section of goal plan. Long Term Goal: Reduce overall level, frequency, and intensity of the anxiety so that daily functioning is not impaired. Short Term Goal: 1.Increase understanding of the beliefs and messages that produce the worry and anxiety. Strategies: 1.Help client develop  reality-based positive cognitive messages/self-talk. 2. Develop a "coping card" or other reminder which coping strategies are recorded for patient's later use.  Diagnosis:   ICD-10-CM   1. Bipolar I disorder with rapid cycling ([place])  F31.9       Plan:   Patient today reporting lower motivation but as noted above in "subject" section, she showed more motivation as we actively discussed recent thoughts that husband shared with her. Husband has cancer and has declined even after trying multiple different options that did not prove to be helpful.  He also had been unwilling off and on to give up his drinking.  He shared with wife recently after speaking with his therapist that he basically didn't want to keep trying options, and instead wanted spend his time doing things that he enjoys while he can still do them.  Hard initially for patient to accept but she did well in session talking through her reaction but also trying to better understand what husband is thinking and feeling at this point. His next appt with cancer doctor is later today and patient will be going with him to that appt as usual. She feels husband will share this with doctor. Encouraged patient in her continued journaling, being in contact with friends and family that are supportive, setting limits with others who are not supportive, believing in herself and her ability to cope and manage in difficult circumstances, taking breaks from her electronics at times, positive self-talk, finding the positives within herself and be able to name them, get outside some daily as weather permits, spend time with her 2 dogs that are therapeutic for her, and realize the strength she shows in working with goal-directed behaviors to move in a direction that supports improved emotional health.   Goal review and progress/challenges noted with patient.  Next appt within 2 weeks.  This record has been created using [employer].  Chart creation errors  have been sought, but may not always have been located and corrected.  Such creation errors do not reflect on the standard of medical care provided.   Shanon Ace, LCSW

## 2021-11-01 ENCOUNTER — Ambulatory Visit: Payer: No Typology Code available for payment source | Admitting: Orthopaedic Surgery

## 2021-11-01 ENCOUNTER — Other Ambulatory Visit: Payer: Self-pay | Admitting: Psychiatry

## 2021-11-01 DIAGNOSIS — F319 Bipolar disorder, unspecified: Secondary | ICD-10-CM

## 2021-11-03 ENCOUNTER — Ambulatory Visit: Payer: Medicare Other | Admitting: Psychiatry

## 2021-11-04 ENCOUNTER — Encounter: Payer: Self-pay | Admitting: Internal Medicine

## 2021-11-04 ENCOUNTER — Ambulatory Visit (INDEPENDENT_AMBULATORY_CARE_PROVIDER_SITE_OTHER): Payer: No Typology Code available for payment source | Admitting: Psychiatry

## 2021-11-04 ENCOUNTER — Encounter: Payer: Self-pay | Admitting: Psychiatry

## 2021-11-04 ENCOUNTER — Other Ambulatory Visit: Payer: Self-pay

## 2021-11-04 VITALS — BP 115/83 | HR 95

## 2021-11-04 DIAGNOSIS — R251 Tremor, unspecified: Secondary | ICD-10-CM | POA: Diagnosis not present

## 2021-11-04 DIAGNOSIS — F411 Generalized anxiety disorder: Secondary | ICD-10-CM

## 2021-11-04 DIAGNOSIS — F319 Bipolar disorder, unspecified: Secondary | ICD-10-CM | POA: Diagnosis not present

## 2021-11-04 DIAGNOSIS — G2401 Drug induced subacute dyskinesia: Secondary | ICD-10-CM | POA: Diagnosis not present

## 2021-11-04 DIAGNOSIS — R296 Repeated falls: Secondary | ICD-10-CM

## 2021-11-04 DIAGNOSIS — F5105 Insomnia due to other mental disorder: Secondary | ICD-10-CM | POA: Diagnosis not present

## 2021-11-04 DIAGNOSIS — F900 Attention-deficit hyperactivity disorder, predominantly inattentive type: Secondary | ICD-10-CM

## 2021-11-04 DIAGNOSIS — G3184 Mild cognitive impairment, so stated: Secondary | ICD-10-CM

## 2021-11-04 NOTE — Patient Instructions (Signed)
Consider clozapine as replacement for Megan Whitney, and Carbamazepine ?

## 2021-11-04 NOTE — Progress Notes (Signed)
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 for agitation, behavioral problems, confusion, hallucinations, self-injury, sleep disturbance and suicidal ideas. The patient is nervous/anxious. The patient is not hyperactive.  No falls since here. Not currently depressed but unable to remove this from the list.  Medications: I have reviewed the patient's current medications.  Current Outpatient Medications  Medication Sig Dispense Refill   acetaminophen (TYLENOL) 650 MG CR tablet Take 1,300 mg by mouth as needed for pain.     atorvastatin (LIPITOR) 20 MG tablet TAKE (1) TABLET BY MOUTH AT BEDTIME. 90 tablet 0   benzonatate (TESSALON) 100 MG capsule Take 1 capsule (100 mg total) by mouth 2 (two) times daily as needed for cough. 20 capsule 0   CALCIUM PO Take by mouth.     carbamazepine (EQUETRO) 200 MG CP12 12 hr capsule Take 200 mg by mouth at bedtime.     carbamazepine (TEGRETOL) 100 MG chewable tablet CHEW 1 TABLET BY MOUTH AT BEDTIME. 30 tablet 2   cariprazine (VRAYLAR) 3 MG capsule Take 1 capsule (3 mg total) by mouth daily. (Patient taking differently: Take 3 mg by  mouth daily. Every other day) 30 capsule 1   chlordiazePOXIDE (LIBRIUM) 10 MG capsule Take 1 capsule (10 mg total) by mouth at bedtime. 30 capsule 1   cholecalciferol (VITAMIN D) 25 MCG (1000 UNIT) tablet Take 1,000 Units by mouth daily.     cloNIDine (CATAPRES) 0.1 MG tablet 1/2 tablet at night for 1 week, then 1 tablet at night for 1 week then 1/2 tablet in the morning and 1 tablet at night (Patient taking differently: 1/2 tab BID) 45 tablet 1   dexmethylphenidate (FOCALIN) 10 MG tablet TAKE 1 AND 1/2 TABLETS BY MOUTH TWICE DAILY. 90 tablet 0   Diclofenac Sodium 3 % GEL Apply 1 application topically 2 (two) times daily. 100 g 0   ferrous gluconate (FERGON) 324 MG tablet Take 324 mg by mouth daily with breakfast.     lamoTRIgine (LAMICTAL) 200 MG tablet TAKE (1) TABLET BY MOUTH TWICE DAILY. 60 tablet 0   lithium carbonate 150 MG capsule TAKE (1) CAPSULE BY MOUTH AT BEDTIME. 90 capsule 0   Melatonin 10 MG CAPS Take by mouth at bedtime as needed.     pantoprazole (PROTONIX) 40 MG tablet TAKE (1) TABLET BY MOUTH ONCE DAILY. 90 tablet 0   promethazine-dextromethorphan (PROMETHAZINE-DM) 6.25-15  MG/5ML syrup Take 5 mLs by mouth 4 (four) times daily as needed. 100 mL 0   No current facility-administered medications for this visit.    Medication Side Effects: Other: tremor and weight gain.   Dyskinesia appears better  SE bettter than they were.  Balance problems intermittently  Allergies:  Allergies  Allergen Reactions   Azithromycin Anaphylaxis   Penicillins Anaphylaxis    DID THE REACTION INVOLVE: Swelling of the face/tongue/throat, SOB, or low BP? Yes Sudden or severe rash/hives, skin peeling, or the inside of the mouth or nose? Yes Did it require medical treatment? No When did it last happen?       If all above answers are "NO", may proceed with cephalosporin use.   Adhesive [Tape] Other (See Comments)    On bandaids    Past Medical History:  Diagnosis Date   ADD (attention deficit  disorder)    Allergy    Seasonal   Anemia    History of GI blood loss   Anxiety    Arthritis    Atrophy of vagina 10/07/2020   Bipolar 1 disorder (HCC)    Cancer ([place])    Colon polyps    Depression    Diabetes mellitus ([place])    Edema, lower extremity    Epistaxis    Around 2011 or 2012, required cauterization.    Esophageal stricture    Fracture of superior pubic ramus (HCC) 11/28/2018   GERD (gastroesophageal reflux disease)    Headache(784.0)    Hyperlipidemia    Interstitial cystitis    Joint pain    Lactose intolerance    Lung cancer ([place]) 2002   Obesity    Osteoarthritis    Palpitations    Sleep apnea    Doesn't use a CPAP   Suicidal ideation 01/20/2020   Swallowing difficulty     Family History  Problem Relation Age of Onset   Arthritis Mother    Hearing loss Mother    Hyperlipidemia Mother    Hypertension Mother    Depression Mother    Anxiety disorder Mother    Obesity Mother    Sudden death Mother    Hypertension Father    Diabetes Mellitus II Father    Heart disease Father    Arthritis Father    Cancer Father        Brain   COPD Father    Diabetes Father    Hyperlipidemia Father    Sleep apnea Father    Early death Sister        Aneroxia/Bulimic   Depression Brother    Early death Proofreader Accident   Depression Daughter    Drug abuse Daughter    Heart disease Daughter    Hypertension Daughter    Stroke Maternal Grandmother    Hypertension Maternal Grandmother    Arthritis Maternal Grandfather    Heart attack Maternal Grandfather    Hearing loss Maternal Grandfather    Colon cancer Neg Hx    Esophageal cancer Neg Hx    Rectal cancer Neg Hx     Social History   Socioeconomic History   Marital status: Married    Spouse name: Not on file   Number of children: 1   Years of education: Not on file   Highest education level: Not on file  Occupational History   Occupation: Admin. assistant  Tobacco Use   Smoking status: Never    Smokeless tobacco: Never  Vaping  Use   Vaping Use: Never used  Substance and Sexual Activity   Alcohol use: Yes    Alcohol/week: 1.0 standard drink    Types: 1 Glasses of wine per week    Comment: Moderate   Drug use: No   Sexual activity: Yes  Other Topics Concern   Not on file  Social History Narrative   Pt lives in [place] with husband Lanny Hurst.  Followed by Dr. Clovis Pu for psychiatry and Rinaldo Cloud for therapy.   Social Determinants of Health   Financial Resource Strain: Medium Risk   Difficulty of Paying Living Expenses: Somewhat hard  Food Insecurity: No Food Insecurity   Worried About Charity fundraiser in the Last Year: Never true   Ran Out of Food in the Last Year: Never true  Transportation Needs: No Transportation Needs   Lack of Transportation (Medical): No   Lack of Transportation (Non-Medical): No  Physical Activity: Inactive   Days of Exercise per Week: 0 days   Minutes of Exercise per Session: 0 min  Stress: No Stress Concern Present   Feeling of Stress : Only a little  Social Connections: Moderately Integrated   Frequency of Communication with Friends and Family: More than three times a week   Frequency of Social Gatherings with Friends and Family: More than three times a week   Attends Religious Services: More than 4 times per year   Active Member of [employer] or Organizations: No   Attends Archivist Meetings: Never   Marital Status: Married  Human resources officer Violence: Not At Risk   Fear of Current or Ex-Partner: No   Emotionally Abused: No   Physically Abused: No   Sexually Abused: No    Past Medical History, Surgical history, Social history, and Family history were reviewed and updated as appropriate.   Please see review of systems for further details on the patient's review from today.   Objective:   Physical Exam:  BP 115/83    Pulse 95   Physical Exam Constitutional:      General: She is not in acute distress. Musculoskeletal:         General: No deformity.  Neurological:     Mental Status: She is alert and oriented to person, place, and time.     Cranial Nerves: No dysarthria.     Motor: Tremor present.     Coordination: Coordination normal.     Comments: Mild resting R hand rotational tremor Fidgety mildy with feet Very mild lip pursing.  Psychiatric:        Attention and Perception: Attention and perception normal. She does not perceive auditory or visual hallucinations.        Mood and Affect: Mood is anxious and depressed. Affect is not labile, blunt, angry or inappropriate.        Speech: Speech normal. Speech is not rapid and pressured.        Behavior: Behavior normal. Behavior is not agitated. Behavior is cooperative.        Thought Content: Thought content normal. Thought content is not paranoid or delusional. Thought content does not include homicidal or suicidal ideation. Thought content does not include suicidal plan.        Cognition and Memory: Cognition and memory normal.        Judgment: Judgment normal.     Comments: Insight intact more mood cycling and still anxious. Still anxious     Lab Review:     Component Value Date/Time  NA 141 08/12/2021 0911   K 4.4 08/12/2021 0911   CL 101 08/12/2021 0911   CO2 24 08/12/2021 0911   GLUCOSE 90 08/12/2021 0911   GLUCOSE 102 (H) 01/19/2020 1158   BUN 17 08/12/2021 0911   CREATININE 0.91 08/12/2021 0911   CALCIUM 9.5 08/12/2021 0911   PROT 6.7 08/12/2021 0911   ALBUMIN 4.3 08/12/2021 0911   AST 19 08/12/2021 0911   ALT 11 08/12/2021 0911   ALKPHOS 63 08/12/2021 0911   BILITOT 0.2 08/12/2021 0911   GFRNONAA 83 03/02/2020 1433   GFRAA 96 03/02/2020 1433       Component Value Date/Time   WBC 7.4 08/12/2021 0911   WBC 7.4 01/19/2020 1158   RBC 4.70 08/12/2021 0911   RBC 4.19 01/19/2020 1158   HGB 14.2 08/12/2021 0911   HCT 43.7 08/12/2021 0911   PLT 277 08/12/2021 0911   MCV 93 08/12/2021 0911   MCH 30.2 08/12/2021 0911   MCH 30.3  01/19/2020 1158   MCHC 32.5 08/12/2021 0911   MCHC 32.1 01/19/2020 1158   RDW 12.1 08/12/2021 0911   LYMPHSABS 2.8 08/12/2021 0911   MONOABS 0.7 04/04/2019 1004   EOSABS 0.4 08/12/2021 0911   BASOSABS 0.0 08/12/2021 0911  Vitamin D level 33 on 10K units daily on 12/4/`9 Increased to prescription vitamin d 50K units Monday, Wed, Friday.  Rx sent in.   Lithium Lvl  Date Value Ref Range Status  10/21/2018 0.18 (L) 0.60 - 1.20 mmol/L Final    Comment:    Performed at [facility], [address] [place], [place] [address]     No results found for: PHENYTOIN, PHENOBARB, VALPROATE, CBMZ   .res Assessment: Plan:    Bipolar I disorder with rapid cycling ([place])  Generalized anxiety disorder  Attention deficit hyperactivity disorder (ADHD), predominantly inattentive type  Tremor of both hands  Mild cognitive impairment  Falling  Insomnia due to mental condition  Tardive dyskinesia  [place] has chronic rapid cycling bipolar disorder which is chronically unstable and has been difficult to control.  The rapid cycling is making it difficult to control frequency of depressive episodes and the anxiety as well.  We have typically had to make frequent med changes.  We have had to reduce mood stabilizer dosages including Vraylar and Equetro she is very sensitive to carbamazepine because higher doses cause dizziness.    Since last visit rapid cycled out of mania and into depression. Disc few options remain other than clozapine.  More cycling with less Vraylar.  No easy solution to this.  She's tried all FDA approved meds for bipolar depression.  Failed many other meds too. Since the last appointment she has more rapic cycled into depression  Disc ECT. Only FDA approved option left. She was denied patient assistance with Vraylar.   She is more depressed at the moment but it is likely that the added stress with her husband is a contributing factor and unlikely that any immediate med change is  going to help.  She is failed multiple prior meds for depression.    Only other option reasonable is ECT or clozapine  Wear a mouthguard bc of teeth grinding.  It' smuch better off [place].  Prone to UTI and may be causing confusion discussed.  Had confirmed UTI recently.  Since last year when she was stable she started having hypomanic symptoms again.  We increased Equetro back to 300 mg nightly but she she has had balance problems. It had stopped the  manic symptoms but then she started having balance problems again and we had to switch it back to Equetro 200 and add carbamazepine immediate release 100 nightly..  intolerant of Equetro 300 mg HS. After New Year switch Equetro to Carbatrol in hopes of better coverage.  Out of work ADD less of a problem.  Discussed the risk of stimulants including that that could contribute to mood instability and mood swings.    She has a high residual anxiety.  It has been impossible to control all of her symptoms simultaneously without causing side effects. Failed various meds.  Consider clozapine bc TR sx and EPS tendency and few options remain.  Disc risk low WBC, cardiomyopathy, etc, sedation Disc weekly labs etc and REMS program.  She'll look into it and let us know  She agrees.  Continue the following Discussed side effects of each medicine. Continue   Focalin XR 20 mg  To IR 15 mg BID DT Cost and off label for depression  Try to spread this out if possible for mood.  Option Switch Equetro to Carbatrol 200 in hopes for better $ Carbamazepine immediate release 100 mg nightly Lamotrigine 200 mg twice daily Lithium 150 mg nightly Continue Librium 25 HS bc needed for sleep Vraylar 1.5 mg daily Using samples Disc risk worsening TD and tremor. Failed all reasonable alternatives for anxiety.  Option viibryd Librium to 10 mg HS DT ? Effect. Clonidine off label for irritability and anxiety 0.05 mg BID Increase clonidine to 1/2 tablet twice a day for a  week.   If anxiety is still up problem try increasing clonidine to one half in the morning, one half with the evening meal, and one half at bedtime OK Benadryl but disc risk.    Discussed potential metabolic side effects associated with atypical antipsychotics, as well as potential risk for movement side effects. Advised pt to contact office if movement side effects occur.  She may be having some mild TD with toe movement and grimacing.  Disc this in detail.  At this point it's tolerable.  . Suspect it.  "I don't realize it". But sometimes others notice  Option reduce Librium but she has no hangover No additonal sedatives during the day which fight the benefit of the stimulants and be more likely to trigger depression and poor activitty level. We discussed the short-term risks associated with benzodiazepines including sedation and increased fall risk among others.  Discussed long-term side effect risk including dependence, potential withdrawal symptoms, and the potential eventual dose-related risk of dementia.  But recent studies from 2020 dispute this association between benzodiazepines and dementia risk. Newer studies in 2020 do not support an association with dementia.  Disc SE meds and this is heightened by the complication of necessary polypharmacy.  Supportive therapy in terms of dealing with husband's addiction and now new dx metastatic prostate CA.Marland Kitchen Disc poss PT job to help with socialization. Sleep hygiene disc and not layingin bed awake for long periods.  Requires frequent FU DT chronic instability.  Wants to schedule monthly.  FU 4 weeks  Lynder Parents MD, DFAPA  Please see After Visit Summary for patient specific instructions.    Future Appointments  Date Time Provider [place]  11/11/2021 10:00 AM Shanon Ace, LCSW [facility] None  11/25/2021 10:00 AM Shanon Ace, LCSW [facility] None  12/08/2021  9:30 AM Cottle, Billey Co., MD CP-CP None  12/09/2021 10:00 AM Shanon Ace,  LCSW [facility]CP None  12/23/2021 10:00 AM Shanon Ace, LCSW CP-[facility]  None  01/06/2022 10:00 AM Shanon Ace, LCSW [facility] None  02/21/2022 11:00 AM Lindell Spar, MD [facility]  05/16/2022  9:30 AM R[facility] NURSE [facility]    No orders of the defined types were placed in this encounter.      -------------------------------

## 2021-11-05 ENCOUNTER — Telehealth: Payer: Self-pay | Admitting: Psychiatry

## 2021-11-05 ENCOUNTER — Encounter: Payer: Self-pay | Admitting: Internal Medicine

## 2021-11-05 NOTE — Telephone Encounter (Signed)
Called patient and told her that she would most likely take multiple 25 mg tablets to reach the 75 mg dose, that she would probably step up from 25, then 50, and then the 75.  ?

## 2021-11-05 NOTE — Telephone Encounter (Signed)
Patient called in stating that Megan Whitney started her on Clozapine to gradually increase from 25mg  until she reached 75mg . She states that the medication does not come in 75mg . She asked if that changes CC's mind about starting the medication. Pls rtc to discuss [phone] ?

## 2021-11-08 ENCOUNTER — Other Ambulatory Visit: Payer: Self-pay | Admitting: Psychiatry

## 2021-11-08 ENCOUNTER — Telehealth: Payer: Self-pay | Admitting: Psychiatry

## 2021-11-08 DIAGNOSIS — Z79899 Other long term (current) drug therapy: Secondary | ICD-10-CM

## 2021-11-08 DIAGNOSIS — F319 Bipolar disorder, unspecified: Secondary | ICD-10-CM

## 2021-11-08 NOTE — Telephone Encounter (Signed)
LVM with info

## 2021-11-08 NOTE — Telephone Encounter (Signed)
Please send lab order

## 2021-11-08 NOTE — Telephone Encounter (Signed)
Pt called and has decided to  start the clozapine medication. She would like labs to be sent to [place]. ?

## 2021-11-08 NOTE — Telephone Encounter (Signed)
Since standing order to lab core for patient starting clozapine. Please let her know.  Once we get labs will send in order for clozapine ?

## 2021-11-09 ENCOUNTER — Other Ambulatory Visit: Payer: Self-pay

## 2021-11-09 ENCOUNTER — Ambulatory Visit (INDEPENDENT_AMBULATORY_CARE_PROVIDER_SITE_OTHER): Payer: No Typology Code available for payment source | Admitting: Psychiatry

## 2021-11-09 DIAGNOSIS — F319 Bipolar disorder, unspecified: Secondary | ICD-10-CM | POA: Diagnosis not present

## 2021-11-09 NOTE — Progress Notes (Signed)
?    [facility] Counselor/Therapist Progress Note ? ?Patient ID: Megan Whitney, MRN: [id number],   ? ?Date: 11/09/2021 ? ?Time Spent: 50 minutes  ? ?Treatment Type: Individual Therapy ? ?Reported Symptoms: anxiety, anger, frustration, lower motivation, depression ? ?Mental Status Exam: ? ?Appearance:   Neat     ?Behavior:  Appropriate and Sharing  ?Motor:  Normal  ?Speech/Language:   Clear and Coherent  ?Affect:  Anxious, depressed  ?Mood:  anxious and depressed  ?Thought process:  goal directed  ?Thought content:    overthinking  ?Sensory/Perceptual disturbances:    WNL  ?Orientation:  oriented to person, place, time/date, situation, day of week, month of year, year, and stated date of November 09, 2021  ?Attention:  Good  ?Concentration:  Good  ?Memory:  WNL  ?Fund of knowledge:   Good  ?Insight:    Good and Fair  ?Judgment:   Good  ?Impulse Control:  Good and Fair  ? ?Risk Assessment: ?Danger to Self:  No ?Self-injurious Behavior: No ?Danger to Others: No ?Duty to Warn:no ?Physical Aggression / Violence:No  ?Access to Firearms a concern: No  ?Gang Involvement:No  ? ?Subjective:  Patient in today reporting anxiety, depression, anger, frustration, and some decrease in motivation in some things. Some anxiety about "maybe starting a new medication." Some tearfulness off and on, mornings "I anticipate all the things that go wrong."  Worked more on her tendency to look for the negative and what might go wrong, as this happens a lot for her.  Is following up with a caregiver support group through [facility] and it meets twice monthly which will be a good resource for her. "Had a meltdown really over nothing and slammed doors and fussed " and then "I caught myself and said this is a bipolar moment and I was able  to calm down." Patient and husband doing daily devotions at night on a prayer app. Further processed some anticipatory grief and not getting stuck in it. Denies any SI. Showing some increased self-awareness and  strength.  ? ?Interventions: Solution-Oriented/Positive Psychology, Grief Therapy, and Insight-Oriented ? ?Treatment Goal Plan of Care: ?Patient not signing tx plan on computer screen due to [place]. ?Treatment Goals: ?Goals remain on plan as patient works on strategies to meet her goals.  Progress is noted each visit in "Progress" section of goal plan. ?Long Term Goal: ?Reduce overall level, frequency, and intensity of the anxiety so that daily functioning is not impaired. ?Short Term Goal: ?1.Increase understanding of the beliefs and messages that produce the worry and anxiety. ?Strategies: ?1.Help client develop reality-based positive cognitive messages/self-talk. ?2. Develop a "coping card" or other reminder which coping strategies are recorded for patient's later use. ?  ?Diagnosis: ?  ICD-10-CM   ?1. Bipolar I disorder with rapid cycling ([place])  F31.9   ?  ? ?Plan: Patient in today reporting anxiety, depression, anger, frustration, and some decrease in motivation, however, once session started she showed really good motivation. Did some good work on further processing anticipatory grief, and also her habit of assuming worst cast scenarios. States she is feeling more grounded that she has in past week. Reports that she "may start new medication soon for my bipolar". Journaling more and that is helpful. Getting outside more. Feeling "calmer, heard, and more secure. Continues supporting husband with cancer and their communication has become better. ?Encouraged patient in her continued use of journaling, being in contact with friends and family that are supportive, setting limits with others who  are not supportive, believing in herself and her ability to cope and manage in difficult circumstances, taking breaks from her electronics at times, positive self talk, finding the positives within herself and be able to name them, not assuming worst case scenarios, get outside daily as weather permits, spend time with her 2  dogs that are very therapeutic for her, and recognize the strength she shows in working with goal-directed behaviors to move in a direction that supports her improved emotional health. ? ?Goal review and progress/challenges noted with patient. ? ?Next appointment within 2 weeks. ? ?This record has been created using [employer].  Chart creation errors have been sought, but may not always have been located and corrected.  Such creation errors do not reflect on the standard of medical care provided. ? ? ?Shanon Ace, LCSW ? ? ? ? ? ? ? ? ? ? ? ? ? ? ? ? ? ? ?

## 2021-11-10 ENCOUNTER — Other Ambulatory Visit: Payer: Self-pay | Admitting: Psychiatry

## 2021-11-10 DIAGNOSIS — F319 Bipolar disorder, unspecified: Secondary | ICD-10-CM | POA: Diagnosis not present

## 2021-11-10 DIAGNOSIS — Z79899 Other long term (current) drug therapy: Secondary | ICD-10-CM | POA: Diagnosis not present

## 2021-11-11 ENCOUNTER — Ambulatory Visit: Payer: Medicare Other | Admitting: Psychiatry

## 2021-11-12 ENCOUNTER — Other Ambulatory Visit: Payer: Self-pay

## 2021-11-12 ENCOUNTER — Other Ambulatory Visit: Payer: Self-pay | Admitting: Psychiatry

## 2021-11-12 ENCOUNTER — Telehealth: Payer: Self-pay | Admitting: Psychiatry

## 2021-11-12 DIAGNOSIS — F319 Bipolar disorder, unspecified: Secondary | ICD-10-CM

## 2021-11-12 MED ORDER — CLOZAPINE 12.5 MG PO TBDP: 12.5000 mg | ORAL_TABLET | Freq: Every evening | ORAL | 2 refills | Status: DC

## 2021-11-12 MED ORDER — CLOZAPINE 12.5 MG PO TBDP: 12.5000 mg | ORAL_TABLET | Freq: Every evening | ORAL | 0 refills | Status: DC

## 2021-11-12 NOTE — Telephone Encounter (Signed)
[place] [place] prescription to [facility] in [place].  Because she is very med sensitive and sending in the lowest dose of the clozapine and if that does not work we will raise the dose.  Once she starts the clozapine she stops the Vraylar abruptly.  There is no withdrawal from [place] ?

## 2021-11-12 NOTE — Telephone Encounter (Signed)
Pt waiting to hear back from dr.cottle about starting clozapine. Please give her a call at [phone] ?

## 2021-11-12 NOTE — Telephone Encounter (Signed)
Labs completed

## 2021-11-12 NOTE — Telephone Encounter (Signed)
Rx sent to the requested pharmacy. Patient notified about Vraylar discontinuation.  ?

## 2021-11-12 NOTE — Telephone Encounter (Addendum)
Called patient. She said [facility] is not REMS certified. Their sister pharmacy is [place] and she said they are REMS certified.  ? ?Patient wants to know what medications she is supposed to stop and how to wean off any of those medications.  ?

## 2021-11-12 NOTE — Telephone Encounter (Signed)
Let her know we receive the labs for clozapine and I sent the prescription to [facility].  It is likely that they will not have it in stock but they might.  If they do not have it in stock it will probably be Monday before she can get it. ?

## 2021-11-13 ENCOUNTER — Encounter: Payer: Self-pay | Admitting: Internal Medicine

## 2021-11-16 ENCOUNTER — Encounter: Payer: Self-pay | Admitting: Internal Medicine

## 2021-11-17 ENCOUNTER — Telehealth: Payer: Self-pay | Admitting: Internal Medicine

## 2021-11-17 ENCOUNTER — Other Ambulatory Visit: Payer: Self-pay | Admitting: *Deleted

## 2021-11-17 DIAGNOSIS — G4730 Sleep apnea, unspecified: Secondary | ICD-10-CM

## 2021-11-17 NOTE — Telephone Encounter (Signed)
Patient LVM returning call  ?

## 2021-11-17 NOTE — Telephone Encounter (Signed)
Pt advised with verbal understanding  °

## 2021-11-17 NOTE — Telephone Encounter (Signed)
Referral order placed LVM for pt to call the office  ?

## 2021-11-18 ENCOUNTER — Telehealth: Payer: Self-pay

## 2021-11-18 ENCOUNTER — Other Ambulatory Visit: Payer: Self-pay | Admitting: Psychiatry

## 2021-11-18 NOTE — Telephone Encounter (Signed)
Patient started clozapine Tuesday. She was having some nausea and dizziness on Monday and pharmacist advised her to wait until that resolved before starting it. She is tired and has difficulty getting going in the morning, but 2 cups of coffee takes care of that. She takes all her nighttime meds at about 8:30. She said she is sleeping really well, "which is a plus." She does say that she is really, really thirsty. She said she has 12 tablets left.  She wants to know when she could get repeat labs drawn.  ?

## 2021-11-18 NOTE — Telephone Encounter (Signed)
We started the lowest dose of clozapine and she gets 7 tablets at a time because she just takes 1 at night.  She has to get the labs drawn every week.  She can choose the day that she wants to get the labs drawn but cannot get it refilled until we get the results. ? ?So it is not clear to me, but apparently she is not taking the clozapine currently?  The clozapine should not cause nausea but it can cause dizziness.  Her dizziness should resolve before she starts the clozapine.  Are there any other questions or concerns? ?

## 2021-11-19 ENCOUNTER — Encounter: Payer: Self-pay | Admitting: Internal Medicine

## 2021-11-19 NOTE — Telephone Encounter (Signed)
Patient notified. She started clozapine on Tuesday. ?

## 2021-11-22 ENCOUNTER — Telehealth: Payer: Self-pay | Admitting: Psychiatry

## 2021-11-22 DIAGNOSIS — Z79899 Other long term (current) drug therapy: Secondary | ICD-10-CM | POA: Diagnosis not present

## 2021-11-22 DIAGNOSIS — F319 Bipolar disorder, unspecified: Secondary | ICD-10-CM | POA: Diagnosis not present

## 2021-11-22 NOTE — Telephone Encounter (Signed)
Increase salivation is a side affect that can be associated with clozapine.  Clozapine does not cause facial twitches or contortions.  Clozapine does not cause any movement disorder.  It is possible that some of those symptoms could be related to recently stopping Vraylar.  That may resolve with time.  It takes Vraylar several weeks to get out of the system.  Given that we do not have many alternatives my suggestion is she stick with the clozapine.  She is on the lowest dose available. ?

## 2021-11-22 NOTE — Telephone Encounter (Signed)
Patient notified of feedback and recommendations to continue clozapine.  ?

## 2021-11-22 NOTE — Telephone Encounter (Signed)
Megan Whitney called this morning at 11/32am wanting to discuss her new medication - clozapine.  This is follow up from call 11/18/21.  Please call. ?

## 2021-11-22 NOTE — Telephone Encounter (Signed)
Please call her. I don't know enough about the medication/process to answer any questions.  ?

## 2021-11-22 NOTE — Telephone Encounter (Signed)
I spoke with patient last Thursday, 2 days after starting clozapine. She reported none of these symptoms. The only symptoms patient reported was what I included in my note to you on 3/16. Patient received 12 tablets from the pharmacy instead of the 7 prescribed. This was a pharmacy error, that was confirmed by the pharmacist after following up when the patient said she had 10 tablets left, after taking for 2 days. Traci was notified of this. The nausea and dizziness mentioned in the 3/16 note was before she started clozapine and she reported no more episodes since starting it.  ?

## 2021-11-22 NOTE — Telephone Encounter (Signed)
Rtc to pt and she reports having side effects with Clozapine, about 2 days ago she started having facial contortions, blowing bubbles/increase salvation, facial twitches. She started last Tuesday the 14 th. She did get her labs drawn today also.  ?Informed her I would check with Dr. Clovis Pu on his recommendations.  ?

## 2021-11-23 ENCOUNTER — Encounter: Payer: Self-pay | Admitting: Psychiatry

## 2021-11-23 ENCOUNTER — Encounter: Payer: Self-pay | Admitting: Internal Medicine

## 2021-11-23 NOTE — Telephone Encounter (Signed)
Received her CBC will update in [facility] if not already ?

## 2021-11-24 ENCOUNTER — Telehealth: Payer: Self-pay | Admitting: Psychiatry

## 2021-11-24 NOTE — Telephone Encounter (Signed)
Pharmacy dispensed 2 tablets because pt received more clozapine then she should have last week. She is notified.  ?

## 2021-11-24 NOTE — Telephone Encounter (Signed)
Pt called at 1:15 pm and asked if her clozapine could be refilled. Pharmacy has not received labs from [place]. She had labs done on Monday. ?

## 2021-11-24 NOTE — Telephone Encounter (Signed)
Her labs were entered on 3/20 so not sure why they can't see them ?

## 2021-11-25 ENCOUNTER — Ambulatory Visit (INDEPENDENT_AMBULATORY_CARE_PROVIDER_SITE_OTHER): Payer: No Typology Code available for payment source | Admitting: Psychiatry

## 2021-11-25 ENCOUNTER — Other Ambulatory Visit: Payer: Self-pay

## 2021-11-25 DIAGNOSIS — F319 Bipolar disorder, unspecified: Secondary | ICD-10-CM | POA: Diagnosis not present

## 2021-11-25 NOTE — Progress Notes (Signed)
?    [facility] Counselor/Therapist Progress Note ? ?Patient ID: Megan Whitney, MRN: [id number],   ? ?Date: 11/25/2021 ? ?Time Spent: 50 minutes  ? ?Treatment Type: Individual Therapy ? ?Reported Symptoms: depression, anger, anxiety, tearfulness ? ?Mental Status Exam: ? ?Appearance:   Casual     ?Behavior:  Appropriate, Sharing, and Motivated  ?Motor:  Normal  ?Speech/Language:   Clear and Coherent  ?Affect:  Depressed and anxious  ?Mood:  anxious and depressed  ?Thought process:  goal directed  ?Thought content:    anxious  ?Sensory/Perceptual disturbances:    WNL  ?Orientation:  oriented to person, place, time/date, situation, day of week, month of year, year, and stated date of November 25, 2021  ?Attention:  Good/Fair  ?Concentration:  Good and Fair  ?Memory:  WNL  ?Fund of knowledge:   Good and Fair  ?Insight:    Fair  ?Judgment:   Good and Fair  ?Impulse Control:  Good  ? ?Risk Assessment: ?Danger to Self:  No ?Self-injurious Behavior: No ?Danger to Others: No ?Duty to Warn:no ?Physical Aggression / Violence:No  ?Access to Firearms a concern: No  ?Gang Involvement:No  ? ?Subjective:  Patient in today reporting anxiety, frustration, sadness, and depression. Reports today that her depression is her stronger symptom.  Denies any SI and is mostly frustrated about her new medication, issues within family, and her tendency to anticipate "how things will go wrong versus right".  Worked on this more today including encouraging her to attend the caregiver support group through [facility], feeling that would be a good resource for her.  Encouraged patient to try following up on some of the more positive coping skills she was using including she and her husband doing the daily devotions they were doing at night and to continue these activities even though at times can be challenging, and at this point her biggest challenge she feels is adjusting to her new medication.  Does admit that she has been "looking for what  might go wrong versus right" with the meds and is going to try being more objective and not assuming the worst. ? ?Interventions: Cognitive Behavioral Therapy, Solution-Oriented/Positive Psychology, and Ego-Supportive ? ?Treatment Goal Plan of Care: ?Patient not signing tx plan on computer screen due to [place]. ?Treatment Goals: ?Goals remain on plan as patient works on strategies to meet her goals.  Progress is noted each visit in "Progress" section of goal plan. ?Long Term Goal: ?Reduce overall level, frequency, and intensity of the anxiety so that daily functioning is not impaired. ?Short Term Goal: ?1.Increase understanding of the beliefs and messages that produce the worry and anxiety. ?Strategies: ?1.Help client develop reality-based positive cognitive messages/self-talk. ?2. Develop a "coping card" or other reminder which coping strategies are recorded for patient's later use. ? ?Diagnosis: ?  ICD-10-CM   ?1. Bipolar I disorder with rapid cycling ([place])  F31.9   ?  ? ?Plan:  Patient in today reporting and working further on her anxiety, frustration, depression. Assuming newer medication won't work, anger towards husband both contribute to her depression, and shared her frustration in session today. Denies and SI or HI. Issues at church frustrating her and "making me angry." Processed some of the church issues especially around "gay issues" which patient is not in agreement with." Also working further on some anticipatory grief, and assuming worst case scenarios. Reports using food for comfort and trying to eat moe healthy. Acknowledges her need to interrupt her negative assumptions and be able to  replace them with more realistic thoughts. Discussed examples of self-care/self-talk needing to be more positive. Encouraged patient in her continued contact with supportive friends and family, continued use of journaling, setting limits with others who are not supportive, believing in herself and her ability to cope  and manage in difficult circumstances, taking breaks from her electronics at times, positive self talk, finding the strengths and positives within herself and be able to name them, not assuming worst case scenarios, get outside daily as weather permits, spend time with her 2 dogs that are very therapeutic for her, and realize the strength she shows in working with goal-directed behaviors to move in a direction that supports her improved emotional health and overall wellbeing. ? ?Goal review and progress/challenges noted with patient. ? ?Next appointment within 2 weeks. ? ?This record has been created using [employer].  Chart creation errors have been sought, but may not always have been located and corrected.  Such creation errors do not reflect on the standard of medical care provided. ? ? ?Shanon Ace, LCSW ? ? ? ? ? ? ? ? ? ? ? ? ? ? ? ? ? ? ?

## 2021-11-26 ENCOUNTER — Telehealth: Payer: Self-pay | Admitting: Psychiatry

## 2021-11-26 NOTE — Telephone Encounter (Signed)
LVM for patient to [facility].  I will try to contact her again before leaving today.  ?

## 2021-11-26 NOTE — Telephone Encounter (Signed)
TC  pt had contact with nurse CO SE from clozapine including lip smacking, tongue movements and uncontrolled chewing.  She called about this also on 11/22/2021. ?This is not a side effect of the clozapine but is a withdrawal dyskinesia from coming off [place].  It may have to be treated, but for now it is important the patient except the accuracy of this physician's assessment.  The only way to do that is to have her stop clozapine.  We will then assess whether the symptoms change or not. ? ?Therefore have her stop the clozapine for now but do not throw it away. ?

## 2021-11-26 NOTE — Telephone Encounter (Signed)
TC  pt had contact with nurse CO SE from clozapine including lip smacking, tongue movements and uncontrolled chewing.  She called about this also on 11/22/2021. ?This is not a side effect of the clozapine but is a withdrawal dyskinesia from coming off [place].  It may have to be treated, but for now it is important the patient except the accuracy of this physician's assessment.  The only way to do that is to have her stop clozapine.  We will then assess whether the symptoms change or not. ? ?Therefore have her stop the clozapine for now but do not throw it away. ?Lynder Parents, MD, DFAPA ? ?

## 2021-11-29 DIAGNOSIS — Z79899 Other long term (current) drug therapy: Secondary | ICD-10-CM | POA: Diagnosis not present

## 2021-11-29 DIAGNOSIS — F319 Bipolar disorder, unspecified: Secondary | ICD-10-CM | POA: Diagnosis not present

## 2021-11-30 ENCOUNTER — Encounter: Payer: Self-pay | Admitting: Psychiatry

## 2021-12-01 DIAGNOSIS — F319 Bipolar disorder, unspecified: Secondary | ICD-10-CM | POA: Diagnosis not present

## 2021-12-01 DIAGNOSIS — G4733 Obstructive sleep apnea (adult) (pediatric): Secondary | ICD-10-CM | POA: Diagnosis not present

## 2021-12-01 DIAGNOSIS — Z79891 Long term (current) use of opiate analgesic: Secondary | ICD-10-CM | POA: Diagnosis not present

## 2021-12-01 DIAGNOSIS — G2401 Drug induced subacute dyskinesia: Secondary | ICD-10-CM | POA: Diagnosis not present

## 2021-12-02 ENCOUNTER — Other Ambulatory Visit: Payer: Self-pay | Admitting: Psychiatry

## 2021-12-02 DIAGNOSIS — F314 Bipolar disorder, current episode depressed, severe, without psychotic features: Secondary | ICD-10-CM

## 2021-12-03 ENCOUNTER — Other Ambulatory Visit: Payer: Self-pay | Admitting: Psychiatry

## 2021-12-03 DIAGNOSIS — F411 Generalized anxiety disorder: Secondary | ICD-10-CM

## 2021-12-06 DIAGNOSIS — F319 Bipolar disorder, unspecified: Secondary | ICD-10-CM | POA: Diagnosis not present

## 2021-12-06 DIAGNOSIS — Z79899 Other long term (current) drug therapy: Secondary | ICD-10-CM | POA: Diagnosis not present

## 2021-12-07 ENCOUNTER — Other Ambulatory Visit: Payer: Self-pay | Admitting: Psychiatry

## 2021-12-07 ENCOUNTER — Encounter: Payer: Self-pay | Admitting: Psychiatry

## 2021-12-07 DIAGNOSIS — F319 Bipolar disorder, unspecified: Secondary | ICD-10-CM

## 2021-12-07 MED ORDER — CLOZAPINE 25 MG PO TABS
25.0000 mg | ORAL_TABLET | Freq: Every day | ORAL | 2 refills | Status: DC
Start: 2021-12-07 — End: 2021-12-14

## 2021-12-08 ENCOUNTER — Encounter: Payer: Self-pay | Admitting: Psychiatry

## 2021-12-08 ENCOUNTER — Ambulatory Visit (INDEPENDENT_AMBULATORY_CARE_PROVIDER_SITE_OTHER): Payer: No Typology Code available for payment source | Admitting: Psychiatry

## 2021-12-08 DIAGNOSIS — Z79899 Other long term (current) drug therapy: Secondary | ICD-10-CM | POA: Diagnosis not present

## 2021-12-08 DIAGNOSIS — R251 Tremor, unspecified: Secondary | ICD-10-CM

## 2021-12-08 DIAGNOSIS — F411 Generalized anxiety disorder: Secondary | ICD-10-CM

## 2021-12-08 DIAGNOSIS — G3184 Mild cognitive impairment, so stated: Secondary | ICD-10-CM

## 2021-12-08 DIAGNOSIS — F900 Attention-deficit hyperactivity disorder, predominantly inattentive type: Secondary | ICD-10-CM | POA: Diagnosis not present

## 2021-12-08 DIAGNOSIS — G2401 Drug induced subacute dyskinesia: Secondary | ICD-10-CM

## 2021-12-08 DIAGNOSIS — F319 Bipolar disorder, unspecified: Secondary | ICD-10-CM

## 2021-12-08 NOTE — Progress Notes (Signed)
Megan Whitney ?[id number] ?02/02/1956 ?66 y.o.  ? ? ? ?Subjective:  ? ?Patient ID:  Megan Whitney is a 66 y.o. (DOB 1956/03/05) female. ? ? ?Chief Complaint:  ?Chief Complaint  ?Patient presents with  ? Follow-up  ? Depression  ? Anxiety  ? Medication Reaction  ? ? ?Depression ?       Associated symptoms include decreased concentration.  Associated symptoms include no suicidal ideas.  Past medical history includes anxiety.   ?Anxiety ?Symptoms include decreased concentration and nervous/anxious behavior. Patient reports no confusion, dizziness, nausea, palpitations or suicidal ideas.  ? ? ?Medication Refill ?Associated symptoms include arthralgias. Pertinent negatives include no nausea or weakness.  Megan Whitney is  follow-up of r chronic mood swings and anxiety and frequent changes in medications.  ? ?At visit December 27, 2018.  Focalin XR was increased from 20 mg to 25 mg daily to help with focus and attention and potentially mood. ? ?When seen February 13, 2019.  In an effort to reduce mood cycling we reduce fluoxetine to 20 mg daily. ? ?At visit August 2020.  No meds were changed.  She continued the following: ?Focalin XR 25 mg every morning and Focalin 10 mg immediate release daily ?Equetro 200 mg nightly ?Fluoxetine 20 mg daily ?Lamotrigine 200 mg twice daily ?Lithium 150 mg nightly ?Vraylar 3 mg daily ? ?She called back November 4 after seeing her therapist stating that she was having some hypomanic symptoms with reduced sleep and increased energy.  This potentiality had been discussed and the decision was made to increase Equetro from 200 mg nightly to 300 mg nightly. ? ?seen August 12, 2019.  Because of balance problems she did not tolerate Equetro 300 mg nightly and it was changed to Equetro 200 mg nightly plus immediate release carbamazepine 100 mg nightly.  Her mood had not been stable enough on Equetro 200 mg nightly alone. Less balance problems with change in CBZ. ? ?seen September 23, 2019.  The  following was changed: For bipolar mixed increase CBZ IR to 200 mg HS.  Disc fall and balance risks.For bipolar mixed increase CBZ IR to 200 mg HS.  Disc fall and balance risks. ? ?She called back October 23, 2019 stating she had had another fall and felt it was due to the medication.  Therefore carbamazepine immediate release was reduced from 200 mg nightly to 100 mg nightly.  The Equetro is unchanged. ? ?seen November 04, 2019.  The following was noted: ? Better at the moment but balance is still somewhat of a problem.  Started PT to help balance.  Had a fall after tripping on a curb and hit her head on sidewalk.  Got a concussion with nausea and HA and dizziness and light sensitivity.  Not over it.  Concentration problems.  Has gotten back to work after a week.   ?Mood sx pretty good with some mild depression.  Nothing severe.  Trying to minimize stress and self care as much as possible.  No manic sx lately and sleeping fairly well.  No racing thoughts.   ?Working another year and plans to retire but H alcoholic and not sure it will be good to be there all the time. ?Seeing therapist q 2 weeks.  Therapy helping ?Marland Kitchen ?Recent serum vitamin D level was determined to be low at 33.  The goal and chronically depressed patient's is in the 50s if possible.  So her vitamin D was increased on August 08, 2018 or thereabouts.  Checked vitamin D level again and this time it was high at 120 and so it was stopped.  She's restarted per PCP at 1000 units daily. ? ?01/06/2020 appointment the following is noted: ?Still on: ?Focalin XR 25 mg every morning and Focalin 10 mg immediate release daily ?Equetro 200 mg nightly ?Carbamazepine immediate release 100 mg nightly ?Fluoxetine 20 mg daily ?Lamotrigine 200 mg twice daily ?Lithium 150 mg nightly ?Vraylar 3 mg daily ?Not good manic.  Angry.  Missed 2 days bc sx.  Last week vacation which didn't go well.  Crying last week and missed a day.  "Pissed off at the whole world" but also depressed  and hard to get OOB today.  Everything makes me angry.   Blows up without control.  Then regrets it.  Sleep irregular lately. ?Finished PT which might have helped some but still balance problems. ?Plan: Cannot increase carbamazepine due to balance issues ?Temporarily Ativan for agitation 0.5 mg tablets  ?DT mania stop fluoxetine ?If fails trial loxapine ? ?01/15/2020 patient called after hours with suicidal thoughts and patient was to go to the [facility]. ?Patient ultimately admitted to [facility] psychiatric unit.  Dr. Clovis Pu spoke with clinical pharmacist they are giving history of medication experience and recommendation for loxapine.  Patient hospital stay for 3 days and discharged on loxapine 10 mg nightly as the new medication. ? ?02/10/2020 phone call patient complaining of insomnia.  Loxapine was increased from 10 to 20 mg nightly due to recent insomnia with mania. ? ?02/14/2020 appointment with the following noted: ?Lately in tears Monday and Tuesday convinced she couldn't do her job.  Better last couple of days.  Motivation is not real good but not depressed like Monday and Tuesday. ?This week missing some meds bc couldn't get like Focalin.  Been taking other meds. ?No sig manic sx.  Sleep is better with more loxapine about 8 hours. ?Anxiety is chronic.  ?No SE loxapine so far unless a little dizzy here and there. ?No med changes. ? ?02/25/2020 appointment urgently made after patient was recently hospitalized.  The following is noted: ?Unstable.  Today manic driving erratically.  Talking a mile a minute.  Not thinking clearly.  Angry.  Slept OK last night.  Hyperactive with poor productivity for a couple of days.  Weekend OK overall.   ?No falls lately. ?More tremor lately.  ?Retiring July 30.  ?Plan: For tremor amantadine 100 mg twice a day if needed. ?Increase loxapine to 3 capsules 1 to 2 hours before bedtime ?Reduce Vraylar to 1.5 mg daily or 3 mg every other day. ?   ?04/01/2020 appointment with the following noted: ?Amantadine hs caused NM. ?Low grade depression for a couple of weeks.  Not severe.   ?Extended work date 06/04/20 to retire date.  She feels OK about it in some ways but doesn't feel fully up to it.  ?Doesn't remember when hypomania resolved from last visit.   ?Sleep is much better now uninterrupted. ?Hard to remember lithium at lunch. ?Still has tremor but better with amantadine.  ?Anxiety still through the roof. ?Plan: Increase loxapine 40 mg HS. ? ?05/04/20 appt with the following noted:  ?Increased loxapine to 40.  Anxiety no better.  All kinds of reasons including worry about retirement and paying for things, but worry is probably exaggerated and H say sit is. ?Sleep good usually.  No SE noted.  Not making her sleep more with change. ?Still some manic sx including shortly  after last visit and then depressed until the last week.  Irritable and angry. ?Some panic with SOB and fear of MI. ?Plan: Continue Vraylar 1.5 mg every day (conisder reduction) ?Increase loxapine to 50 mg daily for 1 week and if no improvement then increase to 75 mg each night (or 3 of the 25 mg capsules) ? ?Multiple phone calls between appointments with the patient complaining loxapine was causing insomnia.  She has adjusted on timing and dose as she felt it was necessary to make it tolerable because when she takes it in the morning she gets sleepy if she takes very much. ? ?06/09/20 appt Noted: ?Max tolerated loxapine 25 mg BID.  More than that HS gives strange dreams and difficult to go back to sleep and more in AM too sedated. ?Not doing well.  Anxiety through the roof.  Did ok with vacation but home worries about everything.   ?Retired.  Has a lot of time to generally worry.  Started reading again for the first time in awhile.  That's helpful. Takes a while to adjust to retirement.  Anxiety and depression feed each other.  Less interest in some activities.  Later in afternoon is not quite  as anxious. ?Hard to drive with anxiety.   ?Plan: Reduce to see if it helps reduce anxiety.  Focalin XR 20 mg every morning  ?and stop Focalin 10 mg immediate release daily ?Equetro 200 mg nightly ?[place]

## 2021-12-09 ENCOUNTER — Ambulatory Visit (INDEPENDENT_AMBULATORY_CARE_PROVIDER_SITE_OTHER): Payer: No Typology Code available for payment source | Admitting: Psychiatry

## 2021-12-09 ENCOUNTER — Other Ambulatory Visit: Payer: Self-pay | Admitting: Psychiatry

## 2021-12-09 DIAGNOSIS — F319 Bipolar disorder, unspecified: Secondary | ICD-10-CM

## 2021-12-09 DIAGNOSIS — F411 Generalized anxiety disorder: Secondary | ICD-10-CM

## 2021-12-09 NOTE — Progress Notes (Signed)
?    [facility] Counselor/Therapist Progress Note ? ?Patient ID: Megan Whitney, MRN: [id number],   ? ?Date: 12/09/2021 ? ?Time Spent: 55 minutes  ? ?Treatment Type: Individual Therapy ? ?Reported Symptoms: anxiety, depression, angry, tearfulness at times, frustration ? ?Mental Status Exam: ? ?Appearance:   Casual     ?Behavior:  Appropriate, Sharing, and Motivated  ?Motor:  Normal  ?Speech/Language:   Clear and Coherent  ?Affect:  Depressed and anxious  ?Mood:  anxious and depressed  ?Thought process:  goal directed  ?Thought content:    Obsessions and overthinking  ?Sensory/Perceptual disturbances:    WNL  ?Orientation:  oriented to person, place, time/date, situation, day of week, month of year, year, and stated date of December 09, 2021  ?Attention:  Good  ?Concentration:  Fair  ?Memory:  WNL  ?Fund of knowledge:   Good  ?Insight:    Good and Fair  ?Judgment:   Good and Fair  ?Impulse Control:  Fair  ? ?Risk Assessment: ?Danger to Self:  No ?Self-injurious Behavior: No ?Danger to Others: No ?Duty to Warn:no ?Physical Aggression / Violence:No  ?Access to Firearms a concern: No  ?Gang Involvement:No  ? ?Subjective:  Patient in today reporting anxiety, depression, frustration, and some anger mostly regarding personal and family concerns. Not getting out as much to church,knitting group. Just "haven't been motivated to go but agreed today to make more effort to get out of the house at least once daily. Concerned about 1 of her dogs who is sick and taking him to vet today for diagnosis. Processed a lot of her anxious/depressive/frustrating thoughts/feelings and was more calm and grounded by session end. Reported feeling some relief and empowered, believing "I can move forward and not fall apart." ?Is part of hospice of [facility] online support group.  Encouraged patient to continue trying to use more of her positive coping skills that have been discussed in sessions as well as following through on some of the  verbal skills we discussed today, especially in talking with her husband as they tend to have frequent communication issues but have also experienced some times of improved communication.  Continues trying to adjust with her new medication and being more patient to give it time and not assuming the worst ? ?Interventions: Solution-Oriented/Positive Psychology, Ego-Supportive, and Insight-Oriented ? ?Treatment Goal Plan of Care: ?Patient not signing tx plan on computer screen due to [place]. ?Treatment Goals: ?Goals remain on plan as patient works on strategies to meet her goals.  Progress is noted each visit in "Progress" section of goal plan. ?Long Term Goal: ?Reduce overall level, frequency, and intensity of the anxiety so that daily functioning is not impaired. ?Short Term Goal: ?1.Increase understanding of the beliefs and messages that produce the worry and anxiety. ?Strategies: ?1.Help client develop reality-based positive cognitive messages/self-talk. ?2. Develop a "coping card" or other reminder which coping strategies are recorded for patient's later use. ? ?Diagnosis: ?  ICD-10-CM   ?1. Bipolar I disorder with rapid cycling ([place])  F31.9   ?  ? ?Plan:  Patient in today showing motivation and continued working on her symptoms of anxiety, depression, frustration, and communication especially with husband.  Also encouraged to keep better contact with friends and begin getting out more again which is typically helpful for her.  Continues to be involved in the online support group offered through [facility].  Denies any SI.  To continue working on interrupting negative thoughts/assumptions and try replacing them with more realistic/hopeful thoughts.  Encouraged patient and her practice of more positive behaviors including: Continued use of journaling between sessions, continued contact with supportive friends and family, setting limits with others who are not supportive, believing in herself and her ability  to cope and manage in difficult circumstances, taking breaks from her electronics at times, more positive self talk, finding the strengths and positives within herself and be able to name them, not assuming worst case scenarios, get outside daily as weather permits, spend time with her 2 dogs that are very therapeutic for her, be in touch with friends often that are supportive, and recognize the strength she shows in working with goal-directed behaviors to move in a direction that supports her improved emotional health. ? ?Goal review and progress/challenges noted with patient. ? ?Next appointment within 2 weeks. ? ?This record has been created using [employer].  Chart creation errors have been sought, but may not always have been located and corrected.  Such creation errors do not reflect on the standard of medical care provided. ? ? ?Shanon Ace, LCSW ? ? ? ? ? ? ? ? ? ? ? ? ? ? ? ? ? ? ?

## 2021-12-13 DIAGNOSIS — Z79899 Other long term (current) drug therapy: Secondary | ICD-10-CM | POA: Diagnosis not present

## 2021-12-13 DIAGNOSIS — F319 Bipolar disorder, unspecified: Secondary | ICD-10-CM | POA: Diagnosis not present

## 2021-12-13 NOTE — Telephone Encounter (Signed)
LVM to confirm clonidine dosing.  ?

## 2021-12-14 ENCOUNTER — Other Ambulatory Visit: Payer: Self-pay | Admitting: Psychiatry

## 2021-12-14 MED ORDER — CLOZAPINE 25 MG PO TABS: 50.0000 mg | ORAL_TABLET | Freq: Every day | ORAL | 1 refills | Status: DC

## 2021-12-15 ENCOUNTER — Encounter: Payer: Self-pay | Admitting: Psychiatry

## 2021-12-16 ENCOUNTER — Telehealth: Payer: Self-pay

## 2021-12-16 NOTE — Telephone Encounter (Signed)
Pt reports that her husband is concerned about her medications. She is constantly chewing and grinding with her mouth and she is slurring her words really bad. She reports the slurring is new. ? ?Informed her I would let Dr. Clovis Pu know her symptoms/side effects and get back with her.  ?

## 2021-12-16 NOTE — Telephone Encounter (Signed)
There have been a number of phone calls since her last appointment.  There have been a number of medicine changes.  I am not sure what she is taking in the way of clozapine.  Is she taking clozapine?  What dosage?  If she is not taking clozapine there is no further med change that I am willing to make until I see her in person. ?

## 2021-12-17 NOTE — Telephone Encounter (Signed)
You just increased her Clozapine at her last CBC results told her to take 1.5 tablets of the 25 mg for 4 nights, then increase to 2 tablets. That started on Tuesday. She contacted me after the first night of 1.5 tabs feeling very sedate the next day and afraid to drive. You advised her to give it 3 more nights to see if symptoms improved. Now she is reporting these symptoms listed ?

## 2021-12-17 NOTE — Telephone Encounter (Signed)
Thank you for the info.  Have her decrease the clozapine to 25 mg HS. ?

## 2021-12-17 NOTE — Telephone Encounter (Signed)
Pt notified of decrease. Instructed to contact office or me for further concerns. Will discuss labs next week.  ?

## 2021-12-20 ENCOUNTER — Ambulatory Visit: Payer: No Typology Code available for payment source | Admitting: Internal Medicine

## 2021-12-20 DIAGNOSIS — Z79899 Other long term (current) drug therapy: Secondary | ICD-10-CM | POA: Diagnosis not present

## 2021-12-20 DIAGNOSIS — F319 Bipolar disorder, unspecified: Secondary | ICD-10-CM | POA: Diagnosis not present

## 2021-12-22 ENCOUNTER — Other Ambulatory Visit: Payer: Self-pay | Admitting: Psychiatry

## 2021-12-22 DIAGNOSIS — F319 Bipolar disorder, unspecified: Secondary | ICD-10-CM

## 2021-12-23 ENCOUNTER — Ambulatory Visit (INDEPENDENT_AMBULATORY_CARE_PROVIDER_SITE_OTHER): Payer: No Typology Code available for payment source | Admitting: Psychiatry

## 2021-12-23 ENCOUNTER — Other Ambulatory Visit: Payer: Self-pay | Admitting: Psychiatry

## 2021-12-23 DIAGNOSIS — F319 Bipolar disorder, unspecified: Secondary | ICD-10-CM

## 2021-12-23 NOTE — Progress Notes (Signed)
?    [facility] Counselor/Therapist Progress Note ? ?Patient ID: Megan Whitney, MRN: [id number],   ? ?Date: 12/23/2021 ? ?Time Spent: 55 minutes  ? ?Treatment Type: Individual Therapy ? ?Reported Symptoms: anxiety, depression, anger ? ?Mental Status Exam: ? ?Appearance:   Casual     ?Behavior:  Appropriate, Sharing, and Motivated  ?Motor:  Normal  ?Speech/Language:   Clear and Coherent  ?Affect:  Depressed and anxious, angry  ?Mood:  angry, anxious, and depressed  ?Thought process:  goal directed  ?Thought content:    Rumination and obsessive thoughts and overthinking  ?Sensory/Perceptual disturbances:    WNL  ?Orientation:  oriented to person, place, time/date, situation, day of week, month of year, year, and stated date of December 23, 2021  ?Attention:  Good  ?Concentration:  Good and Fair  ?Memory:  WNL  ?Fund of knowledge:   Good  ?Insight:    Good and Fair  ?Judgment:   Fair  ?Impulse Control:  Fair  ? ?Risk Assessment: ?Danger to Self:  No ?Self-injurious Behavior: No ?Danger to Others: No ?Duty to Warn:no ?Physical Aggression / Violence:No  ?Access to Firearms a concern: No  ?Gang Involvement:No  ? ?Subjective: Patient today reporting depression, anger, and anxiety as  her main symptoms. Symptoms mostly related to husband's cancer, dog's illness, frustrations with grand-daughter and "just everyday frustrations."  Remains involved in [facility] online cancer support group but feels she only is needing every 2 wks now with them versus 1 week. Med change recently due to slurring words. 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 Her dog that had been quite ill, is much better now. Difficulty for patient to sometimes give her husband space when his is just wanting some time to himself. Patient notes "When he has good days I have better days too." Husband has not been drinking past 3 weeks.  Worked further today on her coping and her communication skills especially  with husband and in her better management of everyday frustrations and not letting small things upset her as much. ? ?Interventions: Solution-Oriented/Positive Psychology, Ego-Supportive, and Insight-Oriented ? ?Treatment Goal Plan of Care: ?Patient not signing tx plan on computer screen due to [place]. ?Treatment Goals: ?Goals remain on plan as patient works on strategies to meet her goals.  Progress is noted each visit in "Progress" section of goal plan. ?Long Term Goal: ?Reduce overall level, frequency, and intensity of the anxiety so that daily functioning is not impaired. ?Short Term Goal: ?1.Increase understanding of the beliefs and messages that produce the worry and anxiety. ?Strategies: ?1.Help client develop reality-based positive cognitive messages/self-talk. ?2. Develop a "coping card" or other reminder which coping strategies are recorded for patient's later use. ?  ? ?Diagnosis: ?  ICD-10-CM   ?1. Bipolar I disorder with rapid cycling ([place])  F31.9   ?  ? ?Plan:  Patient in today showing motivation and active participation in session as she focused on her depression, anxiety, and anger.  Also worked on Clinical research associate with husband.  Trying to be more patient with her medication especially as changes may be needed, and again "not assuming the worst".  Denies any SI.  Seemed to feel better after venting a lot in session today and smiled more near the end.  No tears at all today.  Continues to work with some negative thoughts and assumptions and trying to replace them with more realistic/hopeful thoughts.  Encouraged patient in her and her husband getting out a  little more together even though they are not able to travel so much right now.Encouraged patient in her practice of more positive behaviors including: Continued contact with supportive friends and family, setting limits with others who are not supportive, believing in herself and her ability to cope and manage in difficult circumstances, continued use  of journaling between sessions, taking breaks from her electronics at times, positive self talk, finding the strengths and positives within herself and be able to name them, not assuming worst case scenarios, get outside daily as weather permits, spend time with her 2 dogs that are very therapeutic for her, and recognize the strength she shows working with goal-directed behaviors to move in a direction that supports her improved emotional health and self-confidence. ? ?Goal review and progress/challenges noted with patient. ? ?Next appointment within 2 weeks. ? ?This record has been created using [employer].  Chart creation errors have been sought, but may not always have been located and corrected.  Such creation errors do not reflect on the standard of medical care provided. ? ? ?Shanon Ace, LCSW ? ? ? ? ? ? ? ? ? ? ? ? ? ? ? ? ? ? ?

## 2021-12-27 ENCOUNTER — Encounter (HOSPITAL_BASED_OUTPATIENT_CLINIC_OR_DEPARTMENT_OTHER): Payer: Self-pay

## 2021-12-27 ENCOUNTER — Other Ambulatory Visit: Payer: Self-pay | Admitting: Internal Medicine

## 2021-12-27 DIAGNOSIS — G4733 Obstructive sleep apnea (adult) (pediatric): Secondary | ICD-10-CM

## 2021-12-27 DIAGNOSIS — R0683 Snoring: Secondary | ICD-10-CM

## 2021-12-27 DIAGNOSIS — K219 Gastro-esophageal reflux disease without esophagitis: Secondary | ICD-10-CM

## 2021-12-27 DIAGNOSIS — G4710 Hypersomnia, unspecified: Secondary | ICD-10-CM

## 2021-12-28 DIAGNOSIS — Z79899 Other long term (current) drug therapy: Secondary | ICD-10-CM | POA: Diagnosis not present

## 2021-12-28 DIAGNOSIS — F319 Bipolar disorder, unspecified: Secondary | ICD-10-CM | POA: Diagnosis not present

## 2021-12-29 ENCOUNTER — Encounter: Payer: Self-pay | Admitting: Psychiatry

## 2022-01-03 DIAGNOSIS — F319 Bipolar disorder, unspecified: Secondary | ICD-10-CM | POA: Diagnosis not present

## 2022-01-03 DIAGNOSIS — Z79899 Other long term (current) drug therapy: Secondary | ICD-10-CM | POA: Diagnosis not present

## 2022-01-04 ENCOUNTER — Ambulatory Visit: Payer: No Typology Code available for payment source | Admitting: Psychiatry

## 2022-01-04 ENCOUNTER — Encounter: Payer: Self-pay | Admitting: Psychiatry

## 2022-01-04 DIAGNOSIS — G2401 Drug induced subacute dyskinesia: Secondary | ICD-10-CM

## 2022-01-04 DIAGNOSIS — F5105 Insomnia due to other mental disorder: Secondary | ICD-10-CM | POA: Diagnosis not present

## 2022-01-04 DIAGNOSIS — F319 Bipolar disorder, unspecified: Secondary | ICD-10-CM

## 2022-01-04 DIAGNOSIS — F900 Attention-deficit hyperactivity disorder, predominantly inattentive type: Secondary | ICD-10-CM | POA: Diagnosis not present

## 2022-01-04 DIAGNOSIS — Z79899 Other long term (current) drug therapy: Secondary | ICD-10-CM | POA: Diagnosis not present

## 2022-01-04 DIAGNOSIS — G3184 Mild cognitive impairment, so stated: Secondary | ICD-10-CM | POA: Diagnosis not present

## 2022-01-04 DIAGNOSIS — F411 Generalized anxiety disorder: Secondary | ICD-10-CM | POA: Diagnosis not present

## 2022-01-04 MED ORDER — CLOZAPINE 25 MG PO TABS: 50.0000 mg | ORAL_TABLET | Freq: Every day | ORAL | 3 refills | Status: DC

## 2022-01-04 MED ORDER — CHLORDIAZEPOXIDE HCL 10 MG PO CAPS: 10.0000 mg | ORAL_CAPSULE | Freq: Every evening | ORAL | 1 refills | Status: DC

## 2022-01-04 MED ORDER — CARBAMAZEPINE 100 MG PO CHEW: 100.0000 mg | CHEWABLE_TABLET | Freq: Every evening | ORAL | 0 refills | Status: DC

## 2022-01-04 NOTE — Patient Instructions (Addendum)
Reduce Equetro to 1 of the 100 mg capsule at night for 1 week, then stop it. ?Wait 1 week then stop the carbamazepine chewable. ?Wait 1 more week then reduce clonidine to 1/2 at night for 1 week then stop it. ?

## 2022-01-04 NOTE — Progress Notes (Signed)
Megan Whitney ?[id number] ?1955-11-01 ?66 y.o.  ? ? ? ?Subjective:  ? ?Patient ID:  Megan Whitney is a 66 y.o. (DOB 12-May-1956) female. ? ? ?Chief Complaint:  ?Chief Complaint  ?Patient presents with  ? Follow-up  ? Depression  ? Anxiety  ? Medication Reaction  ? Sleeping Problem  ? ? ?Depression ?       Associated symptoms include decreased concentration.  Associated symptoms include no suicidal ideas.  Past medical history includes anxiety.   ?Anxiety ?Symptoms include decreased concentration, dizziness and nervous/anxious behavior. Patient reports no confusion, nausea, palpitations or suicidal ideas.  ? ? ?Medication Refill ?Associated symptoms include arthralgias. Pertinent negatives include no nausea or weakness.  lSusan SHITAL Whitney is  follow-up of r chronic mood swings and anxiety and frequent changes in medications.  ? ?At visit December 27, 2018.  Focalin XR was increased from 20 mg to 25 mg daily to help with focus and attention and potentially mood. ? ?When seen February 13, 2019.  In an effort to reduce mood cycling we reduce fluoxetine to 20 mg daily. ? ?At visit August 2020.  No meds were changed.  She continued the following: ?Focalin XR 25 mg every morning and Focalin 10 mg immediate release daily ?Equetro 200 mg nightly ?Fluoxetine 20 mg daily ?Lamotrigine 200 mg twice daily ?Lithium 150 mg nightly ?Vraylar 3 mg daily ? ?She called back November 4 after seeing her therapist stating that she was having some hypomanic symptoms with reduced sleep and increased energy.  This potentiality had been discussed and the decision was made to increase Equetro from 200 mg nightly to 300 mg nightly. ? ?seen August 12, 2019.  Because of balance problems she did not tolerate Equetro 300 mg nightly and it was changed to Equetro 200 mg nightly plus immediate release carbamazepine 100 mg nightly.  Her mood had not been stable enough on Equetro 200 mg nightly alone. Less balance problems with change in CBZ. ? ?seen September 23, 2019.  The following was changed: For bipolar mixed increase CBZ IR to 200 mg HS.  Disc fall and balance risks.For bipolar mixed increase CBZ IR to 200 mg HS.  Disc fall and balance risks. ? ?She called back October 23, 2019 stating she had had another fall and felt it was due to the medication.  Therefore carbamazepine immediate release was reduced from 200 mg nightly to 100 mg nightly.  The Equetro is unchanged. ? ?seen November 04, 2019.  The following was noted: ? Better at the moment but balance is still somewhat of a problem.  Started PT to help balance.  Had a fall after tripping on a curb and hit her head on sidewalk.  Got a concussion with nausea and HA and dizziness and light sensitivity.  Not over it.  Concentration problems.  Has gotten back to work after a week.   ?Mood sx pretty good with some mild depression.  Nothing severe.  Trying to minimize stress and self care as much as possible.  No manic sx lately and sleeping fairly well.  No racing thoughts.   ?Working another year and plans to retire but H alcoholic and not sure it will be good to be there all the time. ?Seeing therapist q 2 weeks.  Therapy helping ?Marland Kitchen ?Recent serum vitamin D level was determined to be low at 33.  The goal and chronically depressed patient's is in the 50s if possible.  So her vitamin D was increased on August 08, 2018 or thereabouts.  Checked vitamin D level again and this time it was high at 120 and so it was stopped.  She's restarted per PCP at 1000 units daily. ? ?01/06/2020 appointment the following is noted: ?Still on: ?Focalin XR 25 mg every morning and Focalin 10 mg immediate release daily ?Equetro 200 mg nightly ?Carbamazepine immediate release 100 mg nightly ?Fluoxetine 20 mg daily ?Lamotrigine 200 mg twice daily ?Lithium 150 mg nightly ?Vraylar 3 mg daily ?Not good manic.  Angry.  Missed 2 days bc sx.  Last week vacation which didn't go well.  Crying last week and missed a day.  "Pissed off at the whole world" but  also depressed and hard to get OOB today.  Everything makes me angry.   Blows up without control.  Then regrets it.  Sleep irregular lately. ?Finished PT which might have helped some but still balance problems. ?Plan: Cannot increase carbamazepine due to balance issues ?Temporarily Ativan for agitation 0.5 mg tablets  ?DT mania stop fluoxetine ?If fails trial loxapine ? ?01/15/2020 patient called after hours with suicidal thoughts and patient was to go to the [facility]. ?Patient ultimately admitted to [facility] psychiatric unit.  Dr. Clovis Pu spoke with clinical pharmacist they are giving history of medication experience and recommendation for loxapine.  Patient hospital stay for 3 days and discharged on loxapine 10 mg nightly as the new medication. ? ?02/10/2020 phone call patient complaining of insomnia.  Loxapine was increased from 10 to 20 mg nightly due to recent insomnia with mania. ? ?02/14/2020 appointment with the following noted: ?Lately in tears Monday and Tuesday convinced she couldn't do her job.  Better last couple of days.  Motivation is not real good but not depressed like Monday and Tuesday. ?This week missing some meds bc couldn't get like Focalin.  Been taking other meds. ?No sig manic sx.  Sleep is better with more loxapine about 8 hours. ?Anxiety is chronic.  ?No SE loxapine so far unless a little dizzy here and there. ?No med changes. ? ?02/25/2020 appointment urgently made after patient was recently hospitalized.  The following is noted: ?Unstable.  Today manic driving erratically.  Talking a mile a minute.  Not thinking clearly.  Angry.  Slept OK last night.  Hyperactive with poor productivity for a couple of days.  Weekend OK overall.   ?No falls lately. ?More tremor lately.  ?Retiring July 30.  ?Plan: For tremor amantadine 100 mg twice a day if needed. ?Increase loxapine to 3 capsules 1 to 2 hours before bedtime ?Reduce Vraylar to 1.5 mg daily or 3 mg every  other day. ?  ?04/01/2020 appointment with the following noted: ?Amantadine hs caused NM. ?Low grade depression for a couple of weeks.  Not severe.   ?Extended work date 06/04/20 to retire date.  She feels OK about it in some ways but doesn't feel fully up to it.  ?Doesn't remember when hypomania resolved from last visit.   ?Sleep is much better now uninterrupted. ?Hard to remember lithium at lunch. ?Still has tremor but better with amantadine.  ?Anxiety still through the roof. ?Plan: Increase loxapine 40 mg HS. ? ?05/04/20 appt with the following noted:  ?Increased loxapine to 40.  Anxiety no better.  All kinds of reasons including worry about retirement and paying for things, but worry is probably exaggerated and H say sit is. ?Sleep good usually.  No SE noted.  Not making her sleep more with change. ?Still  some manic sx including shortly after last visit and then depressed until the last week.  Irritable and angry. ?Some panic with SOB and fear of MI. ?Plan: Continue Vraylar 1.5 mg every day (conisder reduction) ?Increase loxapine to 50 mg daily for 1 week and if no improvement then increase to 75 mg each night (or 3 of the 25 mg capsules) ? ?Multiple phone calls between appointments with the patient complaining loxapine was causing insomnia.  She has adjusted on timing and dose as she felt it was necessary to make it tolerable because when she takes it in the morning she gets sleepy if she takes very much. ? ?06/09/20 appt Noted: ?Max tolerated loxapine 25 mg BID.  More than that HS gives strange dreams and difficult to go back to sleep and more in AM too sedated. ?Not doing well.  Anxiety through the roof.  Did ok with vacation but home worries about everything.   ?Retired.  Has a lot of time to generally worry.  Started reading again for the first time in awhile.  That's helpful. Takes a while to adjust to retirement.  Anxiety and depression feed each other.  Less interest in some activities.  Later in afternoon  is not quite as anxious. ?Hard to drive with anxiety.   ?Plan: Reduce to see if it helps reduce anxiety.  Focalin XR 20 mg every morning  ?and stop Focalin 10 mg immediate release daily ?Equetro 200 m

## 2022-01-05 ENCOUNTER — Encounter: Payer: Self-pay | Admitting: Psychiatry

## 2022-01-05 ENCOUNTER — Other Ambulatory Visit: Payer: Self-pay | Admitting: Internal Medicine

## 2022-01-05 DIAGNOSIS — E7849 Other hyperlipidemia: Secondary | ICD-10-CM

## 2022-01-06 ENCOUNTER — Telehealth: Payer: Self-pay | Admitting: Internal Medicine

## 2022-01-06 ENCOUNTER — Ambulatory Visit: Payer: No Typology Code available for payment source | Admitting: Psychiatry

## 2022-01-06 DIAGNOSIS — F319 Bipolar disorder, unspecified: Secondary | ICD-10-CM | POA: Diagnosis not present

## 2022-01-06 NOTE — Telephone Encounter (Signed)
This was sent to pharmacy 01-05-22 ?

## 2022-01-06 NOTE — Telephone Encounter (Signed)
Patient needs refill on  ? ?atorvastatin (LIPITOR) 20 MG tablet  ?

## 2022-01-06 NOTE — Progress Notes (Signed)
?    [facility] Counselor/Therapist Progress Note ? ?Patient ID: Megan Whitney, MRN: [id number],   ? ?Date: 01/06/2022 ? ?Time Spent: 53 minutes  ? ?Treatment Type: Individual Therapy ? ?Reported Symptoms: depression, anxiety, some lack of motivation ? ?Mental Status Exam: ? ?Appearance:   Casual     ?Behavior:  Appropriate and Sharing  ?Motor:  Normal  ?Speech/Language:   Clear and Coherent  ?Affect:  Depressed and anxiousness  ?Mood:  anxious and depressed  ?Thought process:  goal directed  ?Thought content:    overthinking  ?Sensory/Perceptual disturbances:    WNL  ?Orientation:  oriented to person, place, time/date, situation, day of week, month of year, year, and stated date of Jan 06, 2022  ?Attention:  Good  ?Concentration:  Good  ?Memory:  WNL  ?Fund of knowledge:   Good  ?Insight:    Good and Fair  ?Judgment:   Good and Fair  ?Impulse Control:  Good  ? ?Risk Assessment: ?Danger to Self:  No ?Self-injurious Behavior: No ?Danger to Others: No ?Duty to Warn:no ?Physical Aggression / Violence:No  ?Access to Firearms a concern: No  ?Gang Involvement:No  ? ?Subjective:  Patient in today reporting depression and anxiety, and some lack of motivation. Communication issues with husband continue. "Right now he's not drinking." Is in touch as needed with [employer] cancer support group for caregivers." Slurring has reportedly stopped "but still having some difficulty talking." Giving husband more space at times is helping some.Wants to work on better management of anger and stress when they arise, especially with husband.  Talked through some scenarios and strategies that can help her and husband in their communication, stress management,  and decision making. Seemed to become more motivated as we spoke about her reported lack of motivation and looked at some smaller steps she could take that might help her become more motivated especially in accomplishing things at home, better management of everyday  frustrations, and in following through more on recommended strategies inworking on her goals. ? ?Interventions: Solution-Oriented/Positive Psychology and Insight-Oriented ?  ?Treatment Goal Plan of Care: ?Patient not signing tx plan on computer screen due to [place]. ?Treatment Goals: ?Goals remain on plan as patient works on strategies to meet her goals.  Progress is noted each visit in "Progress" section of goal plan. ?Long Term Goal: ?Reduce overall level, frequency, and intensity of the anxiety so that daily functioning is not impaired. ?Short Term Goal: ?1.Increase understanding of the beliefs and messages that produce the worry and anxiety. ?Strategies: ?1.Help client develop reality-based positive cognitive messages/self-talk. ?2. Develop a "coping card" or other reminder which coping strategies are recorded for patient's later use. ? ?Diagnosis: ?  ICD-10-CM   ?1. Bipolar I disorder with rapid cycling ([place])  F31.9   ?  ? ?Plan:  Patient in today reporting lack of motivation however she seemed to show some motivation and was actively involved  in session focusing on her depression, motivation, communication with husband, ways she can help de-escalate stressors between the 2 of them, and not assuming that "a day that does not start off too well is going to be a total loss" as she can choose to have some control over that, and discussed ways that can happen, which patient seemed particularly interested in.  No tearfulness today.  Continues to work with interrupting negative thoughts and assumptions rather than feel controlled by them.  Continue to encourage her and her husband to get out more as they are able  and they are following through on this, including an upcoming 5-day trip to visit husband's mother. Encouraged patient in her practice of more positive behaviors including: Setting limits with others who are not supportive, believing in herself and her ability to cope and manage in difficult circumstances,  continued contact with supportive friends and family, use of journaling between sessions, taking breaks from her electronics at times, positive self talk, finding the strengths and positives within herself and be able to name them, not assuming worst case scenarios, get outside daily as weather permits, spend time with her 2 dogs that are very therapeutic for her, and realize the strength she shows working with goal-directed behaviors to move in a direction that supports her improved emotional health and overall outlook. ? ?Goal review and progress/challenges noted with patient. ? ?Return for appointment within 2 weeks. ? ?This record has been created using [employer].  Chart creation errors have been sought, but may not always have been located and corrected.  Such creation errors do not reflect on the standard of medical care provided. ? ? ?Shanon Ace, LCSW ? ? ? ? ? ? ? ? ? ? ? ? ? ? ? ? ? ? ?

## 2022-01-10 ENCOUNTER — Other Ambulatory Visit: Payer: Self-pay | Admitting: Psychiatry

## 2022-01-10 DIAGNOSIS — F319 Bipolar disorder, unspecified: Secondary | ICD-10-CM | POA: Diagnosis not present

## 2022-01-10 DIAGNOSIS — Z79899 Other long term (current) drug therapy: Secondary | ICD-10-CM | POA: Diagnosis not present

## 2022-01-11 ENCOUNTER — Encounter: Payer: Self-pay | Admitting: Psychiatry

## 2022-01-11 ENCOUNTER — Ambulatory Visit: Payer: No Typology Code available for payment source | Admitting: Internal Medicine

## 2022-01-17 DIAGNOSIS — F319 Bipolar disorder, unspecified: Secondary | ICD-10-CM | POA: Diagnosis not present

## 2022-01-17 DIAGNOSIS — Z79899 Other long term (current) drug therapy: Secondary | ICD-10-CM | POA: Diagnosis not present

## 2022-01-18 ENCOUNTER — Encounter: Payer: Self-pay | Admitting: Psychiatry

## 2022-01-19 ENCOUNTER — Other Ambulatory Visit: Payer: Self-pay | Admitting: Psychiatry

## 2022-01-19 DIAGNOSIS — F314 Bipolar disorder, current episode depressed, severe, without psychotic features: Secondary | ICD-10-CM

## 2022-01-20 ENCOUNTER — Telehealth: Payer: Self-pay

## 2022-01-20 ENCOUNTER — Ambulatory Visit: Payer: No Typology Code available for payment source | Admitting: Psychiatry

## 2022-01-20 NOTE — Telephone Encounter (Signed)
Pt called to report Dr. Clovis Pu is weaning her off of medications and its not going well. She reports she is suffering from depression, anger issues, racing thoughts, suicidal thoughts, obsessive thoughts, severe anxiety, and fearfulness. She canceled her apt with [name] too, feeling hopeless. Depression started about 1 1/2 weeks ago, the other symptoms about 3-4 days ago.    Informed pt I would update Dr. Clovis Pu and follow up with her    I contacted pt to ask her medication list and how she was tolerating Clozapine.. she will follow up with her response.

## 2022-01-20 NOTE — Telephone Encounter (Signed)
At her last appointment in May she was to wean off the [facility] because of the cost but I gave her samples.  So she may still be at the full dose.  She was also going to wean off of clonidine.  In addition she was to start clozapine.  She did not tolerate the clozapine very well to start with.  Therefore please get a list of all her current psychiatric medications and their dosages.  If she tolerating Clozapine well enough to increase the dose?  If she cannot tolerate the psychiatric medications we could refer her for ECT which would treat the symptoms effectively.  So that is an option as well.  If she can tolerate an increase in clozapine even by half tablet that would be the preferred next step.

## 2022-01-21 NOTE — Telephone Encounter (Signed)
So she has stopped the [employer].  Stopping it will cause the blood level of clozapine to slowly increase.  Therefore she may get additonal benefit with no further med changes. She will also tolerate the clozapine better, most likely,  when off the clonidine.   So no med changes today.    If necessary bc of future potential worsening will increase clozapine again to 1 and 1/2 of the 25 mg tablets  and stop the clonidine to improve chances of tolerability.

## 2022-01-21 NOTE — Telephone Encounter (Signed)
Pt called back today with her medication. Focalin 10 mg 2 in the am, Lamotrigine 200 mg 1 am and 1 pm, Clonidine 0.1 mg 1/2 in the am, and 1/2 in the pm. Chlordiazepoxide 10 mg 1 at hs, Lithium 150 mg 1 at hs. Clozapine 25mg  1 at hs but it's been increased a couple of times to 1.5 tablets and she's not tolerated. Due to stop the Clonidine the morning of the 29 th.  She reports feeling better today.

## 2022-01-24 DIAGNOSIS — Z79899 Other long term (current) drug therapy: Secondary | ICD-10-CM | POA: Diagnosis not present

## 2022-01-24 DIAGNOSIS — F319 Bipolar disorder, unspecified: Secondary | ICD-10-CM | POA: Diagnosis not present

## 2022-01-25 ENCOUNTER — Encounter: Payer: Self-pay | Admitting: Psychiatry

## 2022-01-25 NOTE — Telephone Encounter (Signed)
Pt was notified of this already. She agreed.

## 2022-01-27 ENCOUNTER — Other Ambulatory Visit: Payer: Self-pay | Admitting: Psychiatry

## 2022-01-27 NOTE — Telephone Encounter (Signed)
Pt reports her involuntary mouth movements are getting worse. She is constantly licking her lips, grinding her teeth, biting her tongue, and biting her jaw. She reports those symptoms get worse after she takes her morning dose. She didn't know if anything could be done but wanted to make sure Dr. Clovis Pu had her symptoms  Has apt on 02/08/22   Informed her I would follow up with his recommendation.

## 2022-01-27 NOTE — Telephone Encounter (Signed)
We could consider a med to treat the symptoms but I need to see her first bc forms have to be completed.  So we will do it at her appt that is coming soon

## 2022-01-27 NOTE — Telephone Encounter (Signed)
LVM to [facility] 

## 2022-01-28 ENCOUNTER — Other Ambulatory Visit: Payer: Self-pay | Admitting: Psychiatry

## 2022-01-28 DIAGNOSIS — F319 Bipolar disorder, unspecified: Secondary | ICD-10-CM

## 2022-01-28 NOTE — Telephone Encounter (Signed)
Called patient again and provided the information.  This had been an issue early on after stopping Vraylar for clozapine. Dr. Clovis Pu advised her to stop the clozapine so that she could see the sx were not a result of the clozapine but coming off the [place] and she wanted to continue the clozapine. She has an appt. 6/6.

## 2022-02-01 ENCOUNTER — Ambulatory Visit: Payer: No Typology Code available for payment source | Admitting: Psychiatry

## 2022-02-01 DIAGNOSIS — F411 Generalized anxiety disorder: Secondary | ICD-10-CM | POA: Diagnosis not present

## 2022-02-01 DIAGNOSIS — F319 Bipolar disorder, unspecified: Secondary | ICD-10-CM | POA: Diagnosis not present

## 2022-02-01 DIAGNOSIS — Z79899 Other long term (current) drug therapy: Secondary | ICD-10-CM | POA: Diagnosis not present

## 2022-02-01 NOTE — Progress Notes (Signed)
[facility] Counselor/Therapist Progress Note  Patient ID: Megan Whitney, MRN: [id number],    Date: 02/01/2022  Time Spent: 58 minutes   Treatment Type: Individual Therapy  Reported Symptoms: anxiety, depression  Mental Status Exam:  Appearance:   Casual     Behavior:  Appropriate, Sharing, and Motivated  Motor:  Normal  Speech/Language:   Clear and Coherent  Affect:  Depressed and anxiety  Mood:  anxious and depressed  Thought process:  goal directed  Thought content:    Overthinking  Sensory/Perceptual disturbances:    WNL  Orientation:  oriented to person, place, time/date, situation, day of week, month of year, year, and stated date of Feb 01, 2022  Attention:  Good  Concentration:  Good  Memory:  WNL  Fund of knowledge:   Good  Insight:    Good and Fair  Judgment:   Good and Fair  Impulse Control:  Good and Fair   Risk Assessment: Danger to Self:  No Self-injurious Behavior: No Danger to Others: No Duty to Warn:no Physical Aggression / Violence:No  Access to Firearms a concern: No  Gang Involvement:No   Subjective:  Patient in today reporting anxiety and depression and some improvement in her motivation. Still having communication issues with husband, discussed more today, adding that she feels neither of them listen well to each other. Processed some helpful listening skills to help patient in her communication and may actually role model them for husband also as it sounds like both of them could improve the quality of their communication with better listening. Also discussed "how I say what I say" and the importance of this in their communication. Worked further on her anxiety management around the following issues today: finances, car issues, and relationship with husband. Does realize that "I tend to not really live my life because I worry too much about the future." States she had lost interest in some activities like knitting and reading. "But this has been  a more challenging couple weeks with med changes."  Had previous thoughts of self-harm but denies any currently today and gave a firm statement saying she would definitely not harm herself and would call our office or go to be evaluated at [facility] or Hospital ED.  To see Dr. Clovis Pu next week to find out next steps with her meds.   . Interventions: Solution-Oriented/Positive Psychology, Ego-Supportive, and Insight-Oriented  Treatment Goals: Goals remain on plan as patient works on strategies to meet her goals.  Progress is noted each visit in "Progress" section of goal plan. Long Term Goal: Reduce overall level, frequency, and intensity of the anxiety so that daily functioning is not impaired. Short Term Goal: 1.Increase understanding of the beliefs and messages that produce the worry and anxiety. Strategies: 1.Help client develop reality-based positive cognitive messages/self-talk. 2. Develop a "coping card" or other reminder which coping strategies are recorded for patient's later use.  Diagnosis:   ICD-10-CM   1. Generalized anxiety disorder  F41.1      Plan:   Patient in today showing good participation and motivation she reports also improved some.  States she is not sure about her medications and has an appointment with Dr. Clovis Pu this coming week.  Worked more today on her motivation which is some better, communication skills especially with her spouse, some family interactions recently that were stressful and she felt badly about her behavior, and her own self-esteem.  Reports that she apologized to family recently. Irritability at  times however not so much today.  Has not been as interested in her knitting and reading but states that she has had a couple of challenging weeks with med changes and that may have impacted her interest level.  Has been staying in touch with family and also some supportive friends that help her in being more grounded at times.  Some  tearfulness today but not excessive.  Also worked on not assuming "at the start of the day that the whole day is going to be a bad one", and did really well with this today. Encouraged patient to practice more positive behaviors including: Believing in herself and her ability to cope and manage in difficult and sometimes unpredictable circumstances, setting limits with others who are not supportive, continued contact with supportive friends and family, use of journaling between sessions, taking breaks from her electronics at times, positive self talk, finding the strengths and positives within herself and be able to name them, stop assuming worst case scenarios, getting outside daily as weather permits, spending time with her 2 dogs who are very therapeutic for her, and recognize the strength she shows working with goal directed behaviors to move in a direction that supports her improved emotional health and overall outlook.  Goal review and progress/challenges noted with patient.  Next appointment within 2 weeks.  This record has been created using [employer].  Chart creation errors have been sought, but may not always have been located and corrected.  Such creation errors do not reflect on the standard of medical care provided.   Shanon Ace, LCSW

## 2022-02-02 ENCOUNTER — Ambulatory Visit (INDEPENDENT_AMBULATORY_CARE_PROVIDER_SITE_OTHER): Payer: No Typology Code available for payment source | Admitting: Internal Medicine

## 2022-02-02 ENCOUNTER — Encounter: Payer: Self-pay | Admitting: Internal Medicine

## 2022-02-02 ENCOUNTER — Encounter: Payer: Self-pay | Admitting: Psychiatry

## 2022-02-02 VITALS — BP 112/60 | HR 90 | Ht 61.0 in | Wt 182.0 lb

## 2022-02-02 DIAGNOSIS — R1310 Dysphagia, unspecified: Secondary | ICD-10-CM | POA: Diagnosis not present

## 2022-02-02 DIAGNOSIS — K219 Gastro-esophageal reflux disease without esophagitis: Secondary | ICD-10-CM

## 2022-02-02 MED ORDER — PANTOPRAZOLE SODIUM 40 MG PO TBEC: 40.0000 mg | DELAYED_RELEASE_TABLET | Freq: Two times a day (BID) | ORAL | 3 refills | Status: DC

## 2022-02-02 NOTE — Patient Instructions (Signed)
If you are age 66 or older, your body mass index should be between 23-30. Your Body mass index is 34.39 kg/m. If this is out of the aforementioned range listed, please consider follow up with your Primary Care Provider.  If you are age 98 or younger, your body mass index should be between 19-25. Your Body mass index is 34.39 kg/m. If this is out of the aformentioned range listed, please consider follow up with your Primary Care Provider.   ________________________________________________________  The [place] [facility] providers would like to encourage you to use [facility] to communicate with providers for non-urgent requests or questions.  Due to long hold times on the telephone, sending your provider a message by [facility] may be a faster and more efficient way to get a response.  Please allow 48 business hours for a response.  Please remember that this is for non-urgent requests.  _______________________________________________________  We have sent the following medications to your pharmacy for you to pick up at your convenience:  Pantoprazole  You have been scheduled for an endoscopy. Please follow written instructions given to you at your visit today. If you use inhalers (even only as needed), please bring them with you on the day of your procedure.

## 2022-02-03 ENCOUNTER — Ambulatory Visit: Payer: No Typology Code available for payment source | Admitting: Psychiatry

## 2022-02-03 ENCOUNTER — Encounter: Payer: Self-pay | Admitting: Internal Medicine

## 2022-02-03 NOTE — Progress Notes (Unsigned)
HISTORY OF PRESENT ILLNESS:  Megan Whitney is a 66 y.o. female with a history of recurrent iron deficiency anemia secondary to large hiatal hernia with associated Cameron erosions, GERD complicated by peptic stricture requiring esophageal dilation, and adenomatous colon polyps.  She presents today regarding worsening reflux symptoms with regurgitation and aspiration events.  Patient was last seen in this office December 2019.  See that dictation.  Her last upper endoscopy was performed January 2020.  At that time she underwent dilation of symptomatic esophageal stricture via balloon to 20 mm.  For her GERD, she has been maintained on pantoprazole once daily.  Patient tells me that over the past year she has had worsening reflux.  She is having significant breakthrough symptoms, particularly at night.  There has been some recurrence of dysphagia.  She also notices some epigastric discomfort.  For pyrosis she will take an acids.  She describes to me a significant episode of regurgitation with aspiration and coughing 1 evening.  She does report 40 pound weight gain over the past year.  GI review of systems is otherwise negative.  Review of blood work from December 2022 shows normal comprehensive metabolic panel and normal CBC with hemoglobin 14.2.  Abdominal ultrasound from December 2019 with cystic lesion on her kidney.  Otherwise negative.  Was to have follow-up in 6 to 12 months per radiology.  Follow-up renal ultrasound January 2021 revealed an unchanged cystic lesion.  No further follow-up recommended.  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    Esophageal stricture    Fracture of superior pubic ramus (HCC) 11/28/2018   GERD (gastroesophageal reflux disease)    Headache(784.0)    Hyperlipidemia    Interstitial cystitis    Joint pain    Lactose intolerance    Lung cancer ([place]) 2002   Obesity    Osteoarthritis    Palpitations    Sleep apnea    Doesn't use a CPAP   Suicidal ideation 01/20/2020   Swallowing difficulty     Past Surgical History:  Procedure Laterality Date   BALLOON DILATION  05/16/2012   Procedure: BALLOON DILATION;  Surgeon: Inda Castle, MD;  Location: [place];  Service: Endoscopy;  Laterality: N/A;   BUNIONECTOMY  2011   COLONOSCOPY     ENTEROSCOPY  05/16/2012   Procedure: ENTEROSCOPY;  Surgeon: Inda Castle, MD;  Location: [place];  Service: Endoscopy;  Laterality: N/A;   JOINT REPLACEMENT     right shoulder durgery 25 yrs ago  [place]  2006, 2008   bilateral   [place] RESECTION  2002   lung cancer    Social History Megan Whitney  reports that she has never smoked. She has never used smokeless tobacco. She reports current  alcohol use of about 1.0 standard drink per week. She reports that she does not use drugs.  family history includes Anxiety disorder in her mother; Arthritis in her father, maternal grandfather, and mother; COPD in her father; Cancer in her father; Depression in her brother, daughter, and mother; Diabetes in her father; Diabetes Mellitus II in her father; Drug abuse in her daughter; Early death in her brother and sister; Hearing loss in her maternal grandfather and mother; Heart attack in her maternal grandfather; Heart disease in her daughter and father; Hyperlipidemia in her father and mother; Hypertension  in her daughter, father, maternal grandmother, and mother; Obesity in her mother; Sleep apnea in her father; Stroke in her maternal grandmother; Sudden death in her mother.  Allergies  Allergen Reactions   Azithromycin Anaphylaxis   Penicillins Anaphylaxis    DID THE REACTION INVOLVE: Swelling of the face/tongue/throat, SOB, or low BP? Yes Sudden or severe rash/hives, skin peeling, or the inside of the mouth or nose? Yes Did it require medical treatment? No When did it last happen?       If all above answers are "NO", may proceed with cephalosporin use.   Adhesive [Tape] Other (See Comments)    On bandaids       PHYSICAL EXAMINATION: Vital signs: BP 112/60   Pulse 90   Ht 5\' 1"  (1.549 m)   Wt 182 lb (82.6 kg)   SpO2 96%   BMI 34.39 kg/m   Constitutional: generally well-appearing, no acute distress Psychiatric: alert and oriented x3, cooperative Eyes: extraocular movements intact, anicteric, conjunctiva pink Mouth: oral pharynx moist, no lesions Neck: supple no lymphadenopathy Cardiovascular: heart regular rate and rhythm, no murmur Lungs: clear to auscultation bilaterally Abdomen: soft, obese, nontender, nondistended, no obvious ascites, no peritoneal signs, normal bowel sounds, no organomegaly Rectal: Omitted Extremities: no clubbing, cyanosis, or lower extremity edema bilaterally Skin: no lesions on visible extremities Neuro: No focal deficits.  Cranial nerves intact  ASSESSMENT:  1.  GERD with large hiatal hernia.  Worsening symptoms over the past year including breakthrough pain and regurgitation with aspiration at night.  Her disease has been exacerbated by significant weight gain. 2.  Recurrent dysphagia due to peptic stricture 3.  History of adenomatous colon polyps.  Last colonoscopy January 2020. 4.  Obesity 5.  General medical problems 6.  History of renal cyst.  Benign.Marland Kitchen   PLAN:  1.  Reflux precautions with attention to weight loss 2.  Weight loss 3.   Increase pantoprazole to 40 mg twice daily.  Medication risks reviewed 4.  Schedule upper endoscopy with esophageal dilation. 5.  Surveillance colonoscopy around 2025  A total time of 60 minutes was spent preparing to see the patient, reviewing x-rays and laboratories, reviewing prior endoscopy reports, obtaining comprehensive history, performing medically appropriate physical examination, counseling the patient and educated patient regarding the above listed issues, ordering medications, ordering advanced radiology study, and ordering therapeutic endoscopic procedure.  Finally, documenting clinical information in the health record

## 2022-02-08 ENCOUNTER — Ambulatory Visit (INDEPENDENT_AMBULATORY_CARE_PROVIDER_SITE_OTHER): Payer: No Typology Code available for payment source | Admitting: Psychiatry

## 2022-02-08 ENCOUNTER — Other Ambulatory Visit: Payer: Self-pay | Admitting: Psychiatry

## 2022-02-08 ENCOUNTER — Encounter: Payer: Self-pay | Admitting: Psychiatry

## 2022-02-08 VITALS — BP 128/90 | HR 93

## 2022-02-08 DIAGNOSIS — Z79899 Other long term (current) drug therapy: Secondary | ICD-10-CM | POA: Diagnosis not present

## 2022-02-08 DIAGNOSIS — F319 Bipolar disorder, unspecified: Secondary | ICD-10-CM | POA: Diagnosis not present

## 2022-02-08 DIAGNOSIS — F5105 Insomnia due to other mental disorder: Secondary | ICD-10-CM | POA: Diagnosis not present

## 2022-02-08 DIAGNOSIS — G2401 Drug induced subacute dyskinesia: Secondary | ICD-10-CM

## 2022-02-08 DIAGNOSIS — F411 Generalized anxiety disorder: Secondary | ICD-10-CM

## 2022-02-08 DIAGNOSIS — G3184 Mild cognitive impairment, so stated: Secondary | ICD-10-CM

## 2022-02-08 DIAGNOSIS — F900 Attention-deficit hyperactivity disorder, predominantly inattentive type: Secondary | ICD-10-CM | POA: Diagnosis not present

## 2022-02-08 NOTE — Progress Notes (Signed)
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 alittle dizzy. SE: Still having mouth movements and biting tongue.  Sometimes hard to talk.  Drooling.  When tries to increase clozapine slurred speech and severe dizziness.   Mood is a little better.   Sleeping 8-9 hours.  So much better sleep with clozapine.   Still has anxiety, generalized. Plan: To minimize polypharmacy and improve tolerabilty:  Reduce Equetro to 1 of the 100 mg capsule at night for 1 week, then stop it. Wait 1 week then stop the carbamazepine chewable. Wait 1 more week then reduce clonidine to 1/2 at night for 1 week then stop it. Plan: Started clozapine  and continue 25 mg for now bc hasn't tolerated more so far.  02/08/22 appt noted: Mouth movements and biting tongue.  Sores on cheek with constant chewing movements. Sometimes hard to talk. Hypersalivation gets worse as day progresses. Sometimes balance problems.  Constipation managed.   Sleep very well.  8-9 hours. Off Equetro and on clozapine. Still has depression and anxiety without much change   Past Psychiatric Medication Trials: Vraylar 4.5 SE  mouth movements reduced to 3 mg 3/20. It was effective at lower doses for depression.  Worse off it.  Vraylar 1.5 mg every third day led to relapse of significant depression. Caplyta SE at 42 mg .  Cost problems Latuda 80, , olanzapine, Seroquel, risperidone, Abilify, loxapine 25 mg BID (max tolerated) NR  lithium 150,   Trileptal 450, Depakote, Equetro 300 hx balance issues, CBZ ER falling,   Lamictal 200 twice daily,  Focalin,  Ritalin,  fluoxetine 60,  sertraline 100, Wellbutrin history of facial tics, paroxetine cognitive side effects, Lexapro 10 worse buspirone,  ropinirole, amantadine, Sinemet, Artane, Cogentin,  pramipexole 0.5 mg BID helped tremor but caused anger trazodone hangover, Ambien hangover,  Review of Systems:  Review of Systems  HENT:  Positive for dental problem and tinnitus.        Chirping cricket sounds in hears since January  Cardiovascular:  Negative for palpitations.  Gastrointestinal:  Negative for nausea.  Musculoskeletal:  Positive for arthralgias and gait problem.  Neurological:  Positive for dizziness. Negative for tremors, seizures, syncope and weakness.       Ankle problems and balance problems. No falls lately. Occ  stumbles. Tremor is better in hands Mouth movements  Psychiatric/Behavioral:  Positive for decreased concentration and dysphoric mood. Negative for agitation, behavioral problems, confusion, hallucinations, self-injury, sleep disturbance and suicidal ideas. The patient is nervous/anxious. The patient is not hyperactive.  No falls since here. Not currently depressed but unable to remove this from the list.  Medications: I have reviewed the patient's current medications.  Current Outpatient Medications  Medication Sig Dispense Refill   acetaminophen (TYLENOL) 650 MG CR tablet Take 1,300 mg by mouth as needed for pain.     atorvastatin (LIPITOR) 20 MG tablet TAKE (1) TABLET BY MOUTH AT BEDTIME. 90 tablet 0   chlordiazePOXIDE (LIBRIUM) 10  MG capsule Take 1 capsule (10 mg total) by mouth at bedtime. 30 capsule 1   cloZAPine (CLOZARIL) 25 MG tablet Take 2 tablets (50 mg total) by mouth at bedtime. (Patient taking differently: Take 25 mg by mouth at bedtime.) 14 tablet 3   dexmethylphenidate (FOCALIN) 10 MG tablet TAKE 1 AND 1/2 TABLETS BY MOUTH TWICE DAILY. 90 tablet 0   lamoTRIgine (LAMICTAL) 200 MG tablet TAKE (1) TABLET BY MOUTH TWICE DAILY. 60 tablet 0   lithium carbonate 150 MG capsule TAKE (1) CAPSULE BY MOUTH AT BEDTIME. 90 capsule 0   pantoprazole (PROTONIX) 40 MG tablet Take 1 tablet (40 mg total) by mouth 2 (two) times daily. 180 tablet 3   cloNIDine (CATAPRES) 0.1 MG tablet TAKE 1/2 TAB IN THE MORNING AND 1 TAB AT BEDTIME. (Patient not taking: Reported on 02/08/2022) 45 tablet 0   ferrous gluconate (FERGON) 324 MG tablet Take 324 mg by mouth daily with breakfast. (Patient not taking: Reported on 02/08/2022)     Melatonin 10 MG CAPS Take by mouth at bedtime as needed. (Patient not taking: Reported on 02/08/2022)     No current facility-administered medications for this visit.    Medication Side Effects: Other: tremor and weight gain.   Dyskinesia appears better  SE bettter than they were.  Balance problems intermittently  Allergies:  Allergies  Allergen Reactions   Azithromycin Anaphylaxis   Penicillins Anaphylaxis    DID THE REACTION INVOLVE: Swelling of the face/tongue/throat, SOB, or low BP? Yes Sudden or severe rash/hives, skin peeling, or the inside of the mouth or nose? Yes Did it require medical treatment? No When did it last happen?       If all above answers are "NO", may proceed with cephalosporin use.   Adhesive [Tape] Other (See Comments)    On bandaids    Past Medical History:  Diagnosis Date   ADD (attention deficit disorder)    Allergy    Seasonal   Anemia    History of GI blood loss   Anxiety    Arthritis    Atrophy of vagina 10/07/2020   Bipolar 1 disorder (HCC)    Cancer ([place])    Colon polyps     Depression    Diabetes mellitus ([place])    Edema, lower extremity    Epistaxis    Around 2011 or 2012, required cauterization.    Esophageal stricture    Fracture of superior pubic ramus (HCC) 11/28/2018   GERD (gastroesophageal reflux disease)    Headache(784.0)    Hyperlipidemia    Interstitial cystitis    Joint pain    Lactose intolerance    Lung cancer ([place]) 2002   Obesity    Osteoarthritis    Palpitations    Sleep apnea    Doesn't use a CPAP   Suicidal  ideation 01/20/2020   Swallowing difficulty     Family History  Problem Relation Age of Onset   Arthritis Mother    Hearing loss Mother    Hyperlipidemia Mother    Hypertension Mother    Depression Mother    Anxiety disorder Mother    Obesity Mother    Sudden death Mother    Hypertension Father    Diabetes Mellitus II Father    Heart disease Father    Arthritis Father    Cancer Father        Brain   COPD Father    Diabetes Father    Hyperlipidemia Father    Sleep apnea Father    Early death Sister        Aneroxia/Bulimic   Depression Brother    Early death Proofreader Accident   Depression Daughter    Drug abuse Daughter    Heart disease Daughter    Hypertension Daughter    Stroke Maternal Grandmother    Hypertension Maternal Grandmother    Arthritis Maternal Grandfather    Heart attack Maternal Grandfather    Hearing loss Maternal Grandfather    Colon cancer Neg Hx    Esophageal cancer Neg Hx    Rectal cancer Neg Hx     Social History   Socioeconomic History   Marital status: Married    Spouse name: Not on file   Number of children: 1   Years of education: Not on file   Highest education level: Not on file  Occupational History   Occupation: Admin. assistant  Tobacco Use   Smoking status: Never   Smokeless tobacco: Never  Vaping Use   Vaping Use: Never used  Substance and Sexual Activity   Alcohol use: Yes    Alcohol/week: 1.0 standard drink    Types: 1 Glasses of wine per  week    Comment: Moderate   Drug use: No   Sexual activity: Yes  Other Topics Concern   Not on file  Social History Narrative   Pt lives in [place] with husband Lanny Hurst.  Followed by Dr. Clovis Pu for psychiatry and Rinaldo Cloud for therapy.   Social Determinants of Health   Financial Resource Strain: Medium Risk   Difficulty of Paying Living Expenses: Somewhat hard  Food Insecurity: No Food Insecurity   Worried About Charity fundraiser in the Last Year: Never true   Ran Out of Food in the Last Year: Never true  Transportation Needs: No Transportation Needs   Lack of Transportation (Medical): No   Lack of Transportation (Non-Medical): No  Physical Activity: Inactive   Days of Exercise per Week: 0 days   Minutes of Exercise per Session: 0 min  Stress: No Stress Concern Present   Feeling of Stress : Only a little  Social Connections: Moderately Integrated   Frequency of Communication with Friends and Family: More than three times a week   Frequency of Social Gatherings with Friends and Family: More than three times a week   Attends Religious Services: More than 4 times per year   Active Member of [employer] or Organizations: No   Attends Archivist Meetings: Never   Marital Status: Married  Human resources officer Violence: Not At Risk   Fear of Current or Ex-Partner: No   Emotionally Abused: No   Physically Abused: No   Sexually Abused: No    Past Medical History, Surgical history, Social history, and Family history were reviewed  and updated as appropriate.   Please see review of systems for further details on the patient's review from today.   Objective:   Physical Exam:  BP 128/90   Pulse 93   Physical Exam Constitutional:      General: She is not in acute distress. Musculoskeletal:        General: No deformity.  Neurological:     Mental Status: She is alert and oriented to person, place, and time.     Cranial Nerves: No dysarthria.     Motor: Tremor present.      Coordination: Coordination normal.     Comments: Mild resting R hand rotational tremor better but not gone. Fidgety mildy with feet better Some buccolingual tremor  Psychiatric:        Attention and Perception: Attention and perception normal. She does not perceive auditory hallucinations.        Mood and Affect: Mood is anxious and depressed. Affect is not labile, blunt, angry or inappropriate.        Speech: Speech normal. Speech is not rapid and pressured.        Behavior: Behavior normal. Behavior is not agitated. Behavior is cooperative.        Thought Content: Thought content normal. Thought content is not paranoid or delusional. Thought content does not include homicidal or suicidal ideation. Thought content does not include suicidal plan.        Cognition and Memory: Cognition and memory normal.        Judgment: Judgment normal.     Comments: Insight intact more mood cycling and still anxious. Still anxious Overall mood a little better with clozapine.     Lab Review:     Component Value Date/Time   NA 141 08/12/2021 0911   K 4.4 08/12/2021 0911   CL 101 08/12/2021 0911   CO2 24 08/12/2021 0911   GLUCOSE 90 08/12/2021 0911   GLUCOSE 102 (H) 01/19/2020 1158   BUN 17 08/12/2021 0911   CREATININE 0.91 08/12/2021 0911   CALCIUM 9.5 08/12/2021 0911   PROT 6.7 08/12/2021 0911   ALBUMIN 4.3 08/12/2021 0911   AST 19 08/12/2021 0911   ALT 11 08/12/2021 0911   ALKPHOS 63 08/12/2021 0911   BILITOT 0.2 08/12/2021 0911   GFRNONAA 83 03/02/2020 1433   GFRAA 96 03/02/2020 1433       Component Value Date/Time   WBC 7.4 08/12/2021 0911   WBC 7.4 01/19/2020 1158   RBC 4.70 08/12/2021 0911   RBC 4.19 01/19/2020 1158   HGB 14.2 08/12/2021 0911   HCT 43.7 08/12/2021 0911   PLT 277 08/12/2021 0911   MCV 93 08/12/2021 0911   MCH 30.2 08/12/2021 0911   MCH 30.3 01/19/2020 1158   MCHC 32.5 08/12/2021 0911   MCHC 32.1 01/19/2020 1158   RDW 12.1 08/12/2021 0911   LYMPHSABS 2.8  08/12/2021 0911   MONOABS 0.7 04/04/2019 1004   EOSABS 0.4 08/12/2021 0911   BASOSABS 0.0 08/12/2021 0911  Vitamin D level 33 on 10K units daily on 12/4/`9 Increased to prescription vitamin d 50K units Monday, Wed, Friday.  Rx sent in.   Lithium Lvl  Date Value Ref Range Status  10/21/2018 0.18 (L) 0.60 - 1.20 mmol/L Final    Comment:    Performed at [facility], [address] [place], [place] [address]     No results found for: PHENYTOIN, PHENOBARB, VALPROATE, CBMZ   .res Assessment: Plan:    Bipolar I disorder with rapid  cycling ([place])  Generalized anxiety disorder  Attention deficit hyperactivity disorder (ADHD), predominantly inattentive type  Tardive dyskinesia  Insomnia due to mental condition  Long term current use of clozapine  Mild cognitive impairment  Barney has chronic rapid cycling bipolar disorder which is chronically unstable and has been difficult to control.  The rapid cycling is making it difficult to control frequency of depressive episodes and the anxiety as well.  We have typically had to make frequent med changes.  Mood minimally better with clozapine  Started clozapine  and continue 25 mg for now bc hasn't tolerated more and need to start Ingrezza 40 mg for TD.  Disc ECT. Only FDA approved option left. However she decided to pursue clozapine and is up to 25 mg for 1 night. Extensive discussion of clozapine dosing recommendations but we will increase more slowly bc she is so med sensitive.Disc risk low WBC, cardiomyopathy, etc, sedation Disc weekly labs etc and REMS program.    Prone to UTI and may be causing confusion discussed.  Had confirmed UTI recently.  Out of work ADD less of a problem.  Discussed the risk of stimulants including that that could contribute to mood instability and mood swings.    She has a high residual anxiety.  It has been impossible to control all of her symptoms simultaneously without causing side effects. Failed various  meds.  She agrees.  Continue the following Discussed side effects of each medicine. Continue   Focalin XR 20 mg  To IR 15 mg BID DT Cost and off label for depression  Try to spread this out if possible for mood.   Lamotrigine 200 mg twice daily Lithium 150 mg nightly  Disc risk worsening TD and tremor. Failed all reasonable alternatives for anxiety.  Option viibryd Librium to 10 mg HS DT ? Effect. Plan to stop after other meds stopped.  DC Equetro OK    Discussed potential metabolic side effects associated with atypical antipsychotics, as well as potential risk for movement side effects. Advised pt to contact office if movement side effects occur.  She may be having some mild TD with toe movement and grimacing.  Disc this in detail.  At this point it's tolerable.  . Suspect it.  "I don't realize it". But sometimes others notice  Worsening TD manageable.  Disc possible Ingrezza etc but defer DT poloypharmacy  Option reduce Librium but she has no hangover No additonal sedatives during the day which fight the benefit of the stimulants and be more likely to trigger depression and poor activitty level. We discussed the short-term risks associated with benzodiazepines including sedation and increased fall risk among others.  Discussed long-term side effect risk including dependence, potential withdrawal symptoms, and the potential eventual dose-related risk of dementia.  But recent studies from 2020 dispute this association between benzodiazepines and dementia risk. Newer studies in 2020 do not support an association with dementia.  Disc SE meds and this is heightened by the complication of necessary polypharmacy.  Supportive therapy in terms of dealing with husband's addiction and now new dx metastatic prostate CA.Marland Kitchen Disc poss PT job to help with socialization. Sleep hygiene disc and not layingin bed awake for long periods.  Requires frequent FU DT chronic instability.  Wants to schedule  monthly.  FU 4 weeks. Call next week and hope to increase clozapine  Lynder Parents MD, DFAPA  Please see After Visit Summary for patient specific instructions.    Future Appointments  Date Time Provider Department  Center  02/17/2022 10:00 AM Shanon Ace, LCSW [facility] None  02/25/2022 10:00 AM Irene Shipper, MD [facility]  03/10/2022 10:00 AM Shanon Ace, LCSW [facility] None  03/22/2022  1:20 PM Lindell Spar, MD [facility]  03/23/2022 10:00 AM Cottle, Billey Co., MD [facility] None  03/31/2022 10:00 AM Shanon Ace, LCSW [facility] None  04/05/2022 10:00 AM Cottle, Billey Co., MD [facility] None  04/21/2022 10:00 AM Shanon Ace, LCSW [facility] None  05/10/2022  2:30 PM Cottle, Billey Co., MD [facility] None  05/12/2022 10:00 AM Shanon Ace, LCSW [facility] None  05/16/2022  9:30 AM [facility] NURSE [facility]    No orders of the defined types were placed in this encounter.      -------------------------------

## 2022-02-15 ENCOUNTER — Telehealth: Payer: Self-pay

## 2022-02-15 ENCOUNTER — Encounter: Payer: Medicare Other | Admitting: Internal Medicine

## 2022-02-15 DIAGNOSIS — F319 Bipolar disorder, unspecified: Secondary | ICD-10-CM | POA: Diagnosis not present

## 2022-02-15 DIAGNOSIS — Z79899 Other long term (current) drug therapy: Secondary | ICD-10-CM | POA: Diagnosis not present

## 2022-02-15 MED ORDER — VALBENAZINE TOSYLATE 40 MG PO CAPS: 40.0000 mg | ORAL_CAPSULE | Freq: Every day | ORAL | 5 refills | Status: DC

## 2022-02-15 NOTE — Telephone Encounter (Signed)
I contacted [facility] and a prior authorization was submitted for Ingrezza 40 mg with [facility] Medicare Part D with an approval effective 11/17/2021 - 02/15/2023.

## 2022-02-15 NOTE — Telephone Encounter (Signed)
Pt called to report she is doing well on Ingrezza 40 mg daily, she was instructed to call in with an update. Informed her I would submit a prior authorization with her insurance and also send a Rx to [facility] in [place]. Instructed her to go ahead and get labs drawn like usual this week for her CBC and Clozapine. She mentioned Dr. Clovis Pu would be increasing her Clozapine but she wanted to make sure everything was submitted prior to changing her dose yet. Informed her I would update her with the approval and if [facility] was a good fit with her insurance.

## 2022-02-15 NOTE — Telephone Encounter (Signed)
Spoke with pt and she is aware of approval although her co-pay will be around $1900.00  Selinda Eon with [employer] is going to contact her and try to get a grant. She will keep me updated.

## 2022-02-16 ENCOUNTER — Encounter: Payer: Self-pay | Admitting: Psychiatry

## 2022-02-17 ENCOUNTER — Other Ambulatory Visit: Payer: Self-pay | Admitting: Psychiatry

## 2022-02-17 ENCOUNTER — Ambulatory Visit: Payer: No Typology Code available for payment source | Admitting: Psychiatry

## 2022-02-17 DIAGNOSIS — F319 Bipolar disorder, unspecified: Secondary | ICD-10-CM | POA: Diagnosis not present

## 2022-02-17 NOTE — Telephone Encounter (Signed)
Pt did speak with Sharyn Lull at [facility] and her Julio Alm Rx has been filled and mailed to her.   Rtc to pt this morning she was asking since she is doing well and tolerating the Ingrezza 40 mg, she only has some mouth movement but much improved if Dr. Clovis Pu did want to increase her Clozapine 25 mg daily? Before she has had side effects with increase but I told her the Ingrezza may decrease the efficacy so that may be his reason on the increase. Should she increase dose now?

## 2022-02-17 NOTE — Telephone Encounter (Signed)
Thank you. Yes she was only taking 1 of the 25 mg at hs, I will have her add 1/2 tablet

## 2022-02-17 NOTE — Progress Notes (Signed)
[facility] Counselor/Therapist Progress Note  Patient ID: Megan Whitney, MRN: [id number],    Date: 02/17/2022  Time Spent: 55 minutes   Treatment Type: Individual Therapy  Reported Symptoms: depression, anxiety, obsessive thinking, some hopelessness  Mental Status Exam:  Appearance:   Casual     Behavior:  Appropriate, Sharing, and Motivated  Motor:  Normal  Speech/Language:   Clear and Coherent  Affect:  Depressed and anxious  Mood:  anxious and depressed  Thought process:  goal directed  Thought content:    Obsessions  Sensory/Perceptual disturbances:    WNL  Orientation:  oriented to person, place, time/date, situation, day of week, month of year, year, and stated date of February 17, 2022  Attention:  Fair  Concentration:  Fair  Memory:  Occasional short term memory concerns  Fund of knowledge:   Good and Fair  Insight:    Fair  Judgment:   Good and Fair  Impulse Control:  Fair   Risk Assessment: Danger to Self:  No Self-injurious Behavior: No Danger to Others: No Duty to Warn:no Physical Aggression / Violence:No  Access to Firearms a concern: No  Gang Involvement:No   Subjective:  Patient in today reporting anxiety, depression, obsessive thinking, and some hopelessness. Symptoms mostly related to personal, husband's cancer, and relationship with daughter. Rates her anxiety a "9" today on 1-10 scale and rates her depression as a "6 or 7". Denies any SI. Still worries about the future which"keeps me from living in the present." Worries excessively. Trying "to pray more and tell myself to not worry so much as it doesn't help in any way." Looks at the past and try "to remind myself how God has blessed me and will continue to bless me."  Encouraged her to write down the few questions she is having and be able to ask husband's doctor at his next appt. Followed  up some today on her worrying and reports she 's some better. Reporting no SI today.   Interventions:  Solution-Oriented/Positive Psychology and Ego-Supportive  Long Term Goal: Reduce overall level, frequency, and intensity of the anxiety so that daily functioning is not impaired. Short Term Goal: 1.Increase understanding of the beliefs and messages that produce the worry and anxiety. Strategies: 1.Help client develop reality-based positive cognitive messages/self-talk. 2. Develop a "coping card" or other reminder which coping strategies are recorded for patient's later use.  Diagnosis:   ICD-10-CM   1. Bipolar I disorder with rapid cycling ([place])  F31.9      Plan:  Patient in today showing good motivation and participation as she worked more on her depression, anxiety, obsessive thoughts, and hopelessness.  Fewer negative assumptions. Tardive dyskinesia  improved with new med added recently. Less tearfulness and she commented twice about being "pleased not having the tardive dyskinesia problems now." Using her support system well, trying to not latch onto negative thinking, remain in the present, and focus on positive self-care and self-talk.  To continue working with goal directed behaviors especially those regarding worry, making assumptions, and managing anxiety. Encouraged patient in her practice of more positive behaviors including: Believing in herself and her ability to cope and manage difficult and sometimes unpredictable circumstances and decisions, setting limits with others who are not supportive, continued contact with supportive friends and family, use of journaling between sessions, taking breaks from her electronics at times, positive self talk, finding the strengths and positives within herself and be able to name them, stop assuming worst case scenarios,  get outside daily as weather permits, spend time with her 2 dogs who are very therapeutic for her, and recognize the strength she shows when working with goal directed behaviors to move in a direction that supports her improved  emotional health, coping, and overall wellbeing.  Goal review and progress/challenges noted with patient.  Next appointment within 2 to 3 weeks.  This record has been created using [employer].  Chart creation errors have been sought, but may not always have been located and corrected.  Such creation errors do not reflect on the standard of medical care provided.   Shanon Ace, LCSW

## 2022-02-17 NOTE — Telephone Encounter (Signed)
Pt notified and will call back with an update if having any issues.

## 2022-02-17 NOTE — Telephone Encounter (Signed)
Yes have her increase the clozapine.  The prescription is written for 2 of the 25 mg tablets at night, but I think she is only taking 1 at night.  She is very med sensitive.  If that is correct and she is taking just 1 tablet at night however increase to 1-1/2 tablets at night.

## 2022-02-21 ENCOUNTER — Encounter: Payer: Self-pay | Admitting: Internal Medicine

## 2022-02-22 ENCOUNTER — Other Ambulatory Visit: Payer: Self-pay | Admitting: Psychiatry

## 2022-02-22 ENCOUNTER — Telehealth: Payer: Self-pay | Admitting: Internal Medicine

## 2022-02-22 DIAGNOSIS — F319 Bipolar disorder, unspecified: Secondary | ICD-10-CM | POA: Diagnosis not present

## 2022-02-22 DIAGNOSIS — Z79899 Other long term (current) drug therapy: Secondary | ICD-10-CM | POA: Diagnosis not present

## 2022-02-22 MED ORDER — CLOZAPINE 25 MG PO TABS: 50.0000 mg | ORAL_TABLET | Freq: Every day | ORAL | 3 refills | Status: DC

## 2022-02-22 NOTE — Telephone Encounter (Signed)
Patient called wanted to let Dr. Henrene Pastor know that she started the Ingrezza medication.

## 2022-02-23 ENCOUNTER — Encounter: Payer: Self-pay | Admitting: Psychiatry

## 2022-02-24 ENCOUNTER — Encounter: Payer: Self-pay | Admitting: Internal Medicine

## 2022-02-25 ENCOUNTER — Encounter: Payer: Self-pay | Admitting: Internal Medicine

## 2022-02-25 ENCOUNTER — Ambulatory Visit (AMBULATORY_SURGERY_CENTER): Payer: No Typology Code available for payment source | Admitting: Internal Medicine

## 2022-02-25 VITALS — BP 121/64 | HR 81 | Temp 98.2°F | Resp 16

## 2022-02-25 DIAGNOSIS — K219 Gastro-esophageal reflux disease without esophagitis: Secondary | ICD-10-CM

## 2022-02-25 DIAGNOSIS — K449 Diaphragmatic hernia without obstruction or gangrene: Secondary | ICD-10-CM

## 2022-02-25 DIAGNOSIS — K222 Esophageal obstruction: Secondary | ICD-10-CM

## 2022-02-25 DIAGNOSIS — R1310 Dysphagia, unspecified: Secondary | ICD-10-CM | POA: Diagnosis not present

## 2022-02-25 MED ORDER — SODIUM CHLORIDE 0.9 % IV SOLN: 500.0000 mL | Freq: Once | INTRAVENOUS | Status: DC

## 2022-02-25 NOTE — Progress Notes (Signed)
Vitals-DT  History reviewed.R forearm is swollen by Waynard Edwards, RN.  Pressure dressing applied with heat pack.  [facility] notified, and J Nulty will put in IV.

## 2022-02-28 ENCOUNTER — Telehealth: Payer: Self-pay | Admitting: *Deleted

## 2022-02-28 ENCOUNTER — Telehealth: Payer: Self-pay

## 2022-02-28 NOTE — Telephone Encounter (Signed)
  Follow up Call-     02/25/2022    9:19 AM  Call back number  Post procedure Call Back phone  # [phone]  Permission to leave phone message Yes     Patient questions:  Do you have a fever, pain , or abdominal swelling? No. Pain Score  0 *  Have you tolerated food without any problems? Yes.    Have you been able to return to your normal activities? Yes.    Do you have any questions about your discharge instructions: Diet   No. Medications  No. Follow up visit  No.  Do you have questions or concerns about your Care? No.  Actions: * If pain score is 4 or above: No action needed, pain <4.

## 2022-03-01 DIAGNOSIS — F319 Bipolar disorder, unspecified: Secondary | ICD-10-CM | POA: Diagnosis not present

## 2022-03-01 DIAGNOSIS — Z79899 Other long term (current) drug therapy: Secondary | ICD-10-CM | POA: Diagnosis not present

## 2022-03-01 NOTE — Telephone Encounter (Signed)
Pt notified okay to adjust timing of medication.

## 2022-03-03 ENCOUNTER — Other Ambulatory Visit: Payer: Self-pay | Admitting: Psychiatry

## 2022-03-03 ENCOUNTER — Telehealth: Payer: Self-pay

## 2022-03-03 DIAGNOSIS — F411 Generalized anxiety disorder: Secondary | ICD-10-CM

## 2022-03-03 DIAGNOSIS — F314 Bipolar disorder, current episode depressed, severe, without psychotic features: Secondary | ICD-10-CM

## 2022-03-03 NOTE — Telephone Encounter (Signed)
Referral faxed to [facility] for consideration of hiatal hernia repair with Dr. Hassell Done.

## 2022-03-04 ENCOUNTER — Encounter: Payer: Self-pay | Admitting: Internal Medicine

## 2022-03-04 NOTE — Telephone Encounter (Signed)
Spoke with pt and discussed with her that [facility] is out of network with her insurance. Discussed with her that she needs to contact her insurance company and find out what surgeons are in network and then we can make the referral for her. Dr. Henrene Pastor notified.

## 2022-03-04 NOTE — Telephone Encounter (Signed)
Patient aware.

## 2022-03-04 NOTE — Telephone Encounter (Signed)
[facility] called stating that they had received referral for patient, and they are not in network with her insurance and is requesting referral be sent elsewhere. Please advise.

## 2022-03-07 ENCOUNTER — Ambulatory Visit: Payer: No Typology Code available for payment source | Admitting: Psychiatry

## 2022-03-07 ENCOUNTER — Telehealth: Payer: Self-pay

## 2022-03-07 NOTE — Telephone Encounter (Signed)
Prior auth for dexmethylphenidate (FOCALIN) 10 MG tablet has been approved by [employer].Effective dates: 3/323-02/03/23

## 2022-03-09 DIAGNOSIS — F319 Bipolar disorder, unspecified: Secondary | ICD-10-CM | POA: Diagnosis not present

## 2022-03-10 ENCOUNTER — Encounter: Payer: Self-pay | Admitting: Psychiatry

## 2022-03-10 ENCOUNTER — Ambulatory Visit: Payer: No Typology Code available for payment source | Admitting: Psychiatry

## 2022-03-10 ENCOUNTER — Other Ambulatory Visit: Payer: Self-pay | Admitting: Psychiatry

## 2022-03-10 DIAGNOSIS — F319 Bipolar disorder, unspecified: Secondary | ICD-10-CM | POA: Diagnosis not present

## 2022-03-10 NOTE — Progress Notes (Signed)
[facility] Counselor/Therapist Progress Note  Patient ID: Megan Whitney, MRN: [id number],    Date: 03/10/2022  Time Spent: 50 minutes   Treatment Type: Individual Therapy  Reported Symptoms: anxiety, depression "better", denies SI  Mental Status Exam:  Appearance:   Casual and Neat     Behavior:  Appropriate, Sharing, and Motivated  Motor:  Normal  Speech/Language:   Clear and Coherent  Affect:  Anxious, less depression  Mood:  anxious  Thought process:  goal directed  Thought content:    overthinking  Sensory/Perceptual disturbances:    WNL  Orientation:  oriented to person, place, time/date, situation, day of week, month of year, year, and stated date of March 10, 2022  Attention:  Good  Concentration:  Good and Fair  Memory:  WNL  Fund of knowledge:   Good  Insight:    Good and Fair  Judgment:   Good  Impulse Control:  Fair   Risk Assessment: Danger to Self:  No Self-injurious Behavior: No Danger to Others: No Duty to Warn:no Physical Aggression / Violence:No  Access to Firearms a concern: No  Gang Involvement:No   Subjective:   Patient in today reporting anxiety related to her husband's cancer, husband's alcoholism, herself, and the future. Her support group, a knitting group, has stopped for the summer and she reports that is a stressor. She is going to suggest they meet up at least once later this summer. Reports depression decreased. Some obsessive thoughts. Decreased hopelessness. Denies any SI. Self-rates her anxiety today as a "8" on 1-10 anxiety scale, and rates herself a "4" on 1-10 depression scale. Frustration with husband's return to drinking and talked more about this is session today as this has angered and irritated patient. Working on her excessive worrying as she realizes her worrying keeps her from living in the present. Praying more for strength, peace, her husband, wisdom. Staying in contact with supportive people she feels is one of her  strengths.   Interventions: Cognitive Behavioral Therapy and Ego-Supportive  Long Term Goal: Reduce overall level, frequency, and intensity of the anxiety so that daily functioning is not impaired. Short Term Goal: 1.Increase understanding of the beliefs and messages that produce the worry and anxiety. Strategies: 1.Help client develop reality-based positive cognitive messages/self-talk. 2. Develop a "coping card" or other reminder which coping strategies are recorded for patient's later use.  Diagnosis:   ICD-10-CM   1. Bipolar I disorder with rapid cycling ([place])  F31.9      Plan:  Patient in today showing good participation and motivation, reporting some progress as she focused on her anxiety, decreased depression, increased frustration with husband for returning to drinking.  Processed her thoughts on these as well as some obsessive thinking and notes that her hopelessness has noticeably decreased in recent weeks.  Feels like she is also making progress in not always assuming the negative and is able to catch herself in that at times.  No tearfulness today.  Using her support system well especially one of her longtime friends and ladies that have been in her knitting group.  Shared about a new medication that she might try and that her insurance does not cover it but has found out through the drug company that she can get it paid for through a grant.  Seems to be having a better day today and able to be more positive, remaining in the present, more encouraging self talk, trying to worry less and not make negative  assumptions. Encouraged patient in practicing more positive behaviors as noted in session including: Believing in herself and her ability to manage difficult and sometimes unpredictable circumstances and decision making, setting limits with others who are not supportive, continued contact with supportive friends and family, use of journaling between sessions as well as some reading that  has been helpful to her in the past, taking breaks from her electronics at times, positive self talk, finding the strengths and positives within herself and be able to name them, stop assuming worst case scenarios, get outside daily as weather permits, spending time with her 2 dogs who are therapeutic for her, and feel good about the strength she shows when working with goal directed behaviors to move in a direction that supports her improved emotional health, coping, and overall outlook.  Goal review and progress/challenges noted with patient.  Next appointment within 2 to 3 weeks.  This record has been created using [employer].  Chart creation errors have been sought, but may not always have been located and corrected.  Such creation errors do not reflect on the standard of medical care provided.   Shanon Ace, LCSW

## 2022-03-15 DIAGNOSIS — Z8619 Personal history of other infectious and parasitic diseases: Secondary | ICD-10-CM | POA: Diagnosis not present

## 2022-03-15 DIAGNOSIS — Z9189 Other specified personal risk factors, not elsewhere classified: Secondary | ICD-10-CM | POA: Diagnosis not present

## 2022-03-15 DIAGNOSIS — N952 Postmenopausal atrophic vaginitis: Secondary | ICD-10-CM | POA: Diagnosis not present

## 2022-03-18 DIAGNOSIS — E782 Mixed hyperlipidemia: Secondary | ICD-10-CM | POA: Diagnosis not present

## 2022-03-18 DIAGNOSIS — Z0000 Encounter for general adult medical examination without abnormal findings: Secondary | ICD-10-CM | POA: Diagnosis not present

## 2022-03-19 LAB — VITAMIN D 25 HYDROXY (VIT D DEFICIENCY, FRACTURES): Vit D, 25-Hydroxy: 46.5 ng/mL (ref 30.0–100.0)

## 2022-03-19 LAB — CMP14+EGFR
ALT: 10 IU/L (ref 0–32)
AST: 17 IU/L (ref 0–40)
Albumin/Globulin Ratio: 1.7 (ref 1.2–2.2)
Albumin: 4.3 g/dL (ref 3.9–4.9)
Alkaline Phosphatase: 83 IU/L (ref 44–121)
BUN/Creatinine Ratio: 11 — ABNORMAL LOW (ref 12–28)
BUN: 10 mg/dL (ref 8–27)
Bilirubin Total: 0.5 mg/dL (ref 0.0–1.2)
CO2: 22 mmol/L (ref 20–29)
Calcium: 9.3 mg/dL (ref 8.7–10.3)
Chloride: 104 mmol/L (ref 96–106)
Creatinine, Ser: 0.87 mg/dL (ref 0.57–1.00)
Globulin, Total: 2.5 g/dL (ref 1.5–4.5)
Glucose: 100 mg/dL — ABNORMAL HIGH (ref 70–99)
Potassium: 4.2 mmol/L (ref 3.5–5.2)
Sodium: 141 mmol/L (ref 134–144)
Total Protein: 6.8 g/dL (ref 6.0–8.5)
eGFR: 73 mL/min/{1.73_m2} (ref >59)

## 2022-03-19 LAB — LIPID PANEL
Chol/HDL Ratio: 2.4 ratio (ref 0.0–4.4)
Cholesterol, Total: 202 mg/dL — ABNORMAL HIGH (ref 100–199)
HDL: 84 mg/dL (ref >39)
LDL Chol Calc (NIH): 107 mg/dL — ABNORMAL HIGH (ref 0–99)
Triglycerides: 63 mg/dL (ref 0–149)
VLDL Cholesterol Cal: 11 mg/dL (ref 5–40)

## 2022-03-19 LAB — CBC WITH DIFFERENTIAL/PLATELET
Basophils Absolute: 0 10*3/uL (ref 0.0–0.2)
Basos: 1 %
EOS (ABSOLUTE): 0.4 10*3/uL (ref 0.0–0.4)
Eos: 6 %
Hematocrit: 40.8 % (ref 34.0–46.6)
Hemoglobin: 13 g/dL (ref 11.1–15.9)
Immature Grans (Abs): 0 10*3/uL (ref 0.0–0.1)
Immature Granulocytes: 1 %
Lymphocytes Absolute: 2.1 10*3/uL (ref 0.7–3.1)
Lymphs: 34 %
MCH: 28.9 pg (ref 26.6–33.0)
MCHC: 31.9 g/dL (ref 31.5–35.7)
MCV: 91 fL (ref 79–97)
Monocytes Absolute: 0.6 10*3/uL (ref 0.1–0.9)
Monocytes: 10 %
Neutrophils Absolute: 3.1 10*3/uL (ref 1.4–7.0)
Neutrophils: 48 %
Platelets: 261 10*3/uL (ref 150–450)
RBC: 4.5 x10E6/uL (ref 3.77–5.28)
RDW: 12.8 % (ref 11.7–15.4)
WBC: 6.3 10*3/uL (ref 3.4–10.8)

## 2022-03-19 LAB — HEMOGLOBIN A1C
Est. average glucose Bld gHb Est-mCnc: 117 mg/dL
Hgb A1c MFr Bld: 5.7 % — ABNORMAL HIGH (ref 4.8–5.6)

## 2022-03-19 LAB — TSH: TSH: 2.02 u[IU]/mL (ref 0.450–4.500)

## 2022-03-21 ENCOUNTER — Ambulatory Visit: Payer: No Typology Code available for payment source | Admitting: Psychiatry

## 2022-03-21 DIAGNOSIS — F319 Bipolar disorder, unspecified: Secondary | ICD-10-CM

## 2022-03-21 NOTE — Progress Notes (Signed)
[facility] Counselor/Therapist Progress Note  Patient ID: Megan Whitney, MRN: [id number],    Date: 03/21/2022  Time Spent: 50 minutes   Treatment Type: Individual Therapy  Reported Symptoms:  anxiety, some anger with husband "but anger is better now", more "on edge" at times, no thoughts to harm self or others, frustrated with other family and her church, denies any depression  Mental Status Exam:  Appearance:   Casual and Neat     Behavior:  Appropriate, Sharing, and Motivated  Motor:  Normal  Speech/Language:   Clear and Coherent  Affect:  Anxious, frustrated  Mood:  anxious and irritable  Thought process:  normal  Thought content:    Some ruminating  Sensory/Perceptual disturbances:    WNL  Orientation:  oriented to person, place, time/date, situation, day of week, month of year, year, and stated date of March 21, 2022  Attention:  Good  Concentration:  Good and Fair  Memory:  Some short memory issues and talking with her Dr about this, next appt tomorrow  Fund of knowledge:   Good  Insight:    Good and Fair  Judgment:   Good and Fair  Impulse Control:  Good and Fair   Risk Assessment: Danger to Self:  No Self-injurious Behavior: No Danger to Others: No Duty to Warn:no Physical Aggression / Violence:No  Access to Firearms a concern: No  Gang Involvement:No   Subjective:  Patient and today reporting symptoms of anxiety, frustration, anger, more "on edge when I get stressed". "Depression very low and anxiety high and is easily agitated by outside circumstances and husband. Had 79yr grand-daughter recently which had some challenges as patient processed today.  Affirmed patient in her limit-setting and in positive behaviors with grandchild as she shares today. Husband having good days and bad days with his cancer. He is still consuming alcohol despite Dr recommendations. Sees her PCP tomorrow and will discuss her memory concerns more with him. Reports depression  "remains very low". Self-rates herself as a 6-7 on 1-10 scale which is a bit lower than last visit. Rates self as a "4" on 1-10 depression scale which is lower than last appt. Denies any current hopelessness nor SI. Some negative thoughts about herself and others but denies any currently. No thoughts of self-harm. "Actually feel I'm a little more up but not manic." States when she's feeling like this, staying involved in activities with others and trying to keep a routine. Encouraged patient to stay on her meds as prescribed and allow for more sleep as she states when she's "on edge" I need to try and get more sleep". Continues work on her "over-worrying" and paying attention to what helps her during challenging times and practice those behaviors more.   Interventions: Cognitive Behavioral Therapy and Solution-Oriented/Positive Psychology  Long Term Goal: Reduce overall level, frequency, and intensity of the anxiety so that daily functioning is not impaired. Short Term Goal: 1.Increase understanding of the beliefs and messages that produce the worry and anxiety. Strategies: 1.Help client develop reality-based positive cognitive messages/self-talk. 2. Develop a "coping card" or other reminder which coping strategies are recorded for patient's later use.  Diagnosis:   ICD-10-CM   1. Bipolar I disorder with rapid cycling ([place])  F31.9      Plan:  Patient in today showing good participation and motivation, reporting that "I am doing better from when I called for the sooner appointment".  States she had been more depressed when she called for the  appointment but has been doing better the last few days, and not sure if she might be spiraling up soon.  Encouraged to remain on her medications and to consult with her doctor as needed.  Talked through some recent anxieties within her family and also some tension with others including her church.  Encouraged her to reach out to her church if they are wanting a  visit as she indicated today rather than expecting them to automatically know, and she seemed motivated to follow through on that which she said would be helpful.  Processed more of some anxious thoughts but also shared that her hopelessness and depression have been much reduced more recently.  Trying to not always assume the negative and "sometimes is successful in catching myself and stopping it".  No tearfulness today.  Smiling appropriately.  Recent family contact with granddaughter and mother which can sometimes be a challenge for reasons that patient explained in more detail but not included in note here.  On a positive note, patient is staying in touch with at least a couple of her long-time friends and that feels helpful to her. Encouraged patient in her practice of more positive behaviors including: Believing more in herself and her ability in managing difficult and unpredictable circumstances, focused on making sound decisions, setting limits with others who are not supportive, continued contact with supportive friends and family, use of journaling between sessions as well as some reading that has been helpful to her in the past, take breaks from her electronics at times, positive self talk, finding the strengths and positives within herself and be able to name them, stop assuming worst case scenarios, get outside daily as weather permits, spending time with her 2 dogs who are therapeutic for her, and recognize the strength she shows when working with goal directed behaviors to move in a direction that supports her improved coping and overall emotional health.  Goal review and progress/challenges noted with patient.  Next appointment within 2 weeks.  This record has been created using [employer].  Chart creation errors have been sought, but may not always have been located and corrected.  Such creation errors do not reflect on the standard of medical care provided.    Shanon Ace,  LCSW

## 2022-03-22 ENCOUNTER — Ambulatory Visit (INDEPENDENT_AMBULATORY_CARE_PROVIDER_SITE_OTHER): Payer: No Typology Code available for payment source | Admitting: Internal Medicine

## 2022-03-22 ENCOUNTER — Encounter: Payer: Self-pay | Admitting: Internal Medicine

## 2022-03-22 VITALS — BP 122/82 | HR 96 | Resp 18 | Ht 60.0 in | Wt 189.4 lb

## 2022-03-22 DIAGNOSIS — K219 Gastro-esophageal reflux disease without esophagitis: Secondary | ICD-10-CM

## 2022-03-22 DIAGNOSIS — F319 Bipolar disorder, unspecified: Secondary | ICD-10-CM

## 2022-03-22 DIAGNOSIS — Z0001 Encounter for general adult medical examination with abnormal findings: Secondary | ICD-10-CM | POA: Insufficient documentation

## 2022-03-22 DIAGNOSIS — E782 Mixed hyperlipidemia: Secondary | ICD-10-CM

## 2022-03-22 DIAGNOSIS — K449 Diaphragmatic hernia without obstruction or gangrene: Secondary | ICD-10-CM | POA: Diagnosis not present

## 2022-03-22 DIAGNOSIS — G20 Parkinson's disease: Secondary | ICD-10-CM | POA: Diagnosis not present

## 2022-03-22 DIAGNOSIS — Z79899 Other long term (current) drug therapy: Secondary | ICD-10-CM | POA: Diagnosis not present

## 2022-03-22 NOTE — Assessment & Plan Note (Signed)

## 2022-03-22 NOTE — Assessment & Plan Note (Signed)
F/u with Neurology Not on any medication currently, prefers to avoid taking medications for now

## 2022-03-22 NOTE — Assessment & Plan Note (Signed)
On Lithium, Lamictal, Ingrezza, clozapine and Focalin Follows up with Psychiatry

## 2022-03-22 NOTE — Assessment & Plan Note (Signed)
On Atorvastatin Lipid profile reviewed

## 2022-03-22 NOTE — Patient Instructions (Addendum)
Please continue taking medications as prescribed.  Please continue to follow low carb diet and ambulate as tolerated.  Please consider getting PCV20 at your local pharmacy.

## 2022-03-22 NOTE — Progress Notes (Signed)
Established Patient Office Visit  Subjective:  Patient ID: Megan Whitney, female    DOB: Jul 29, 1956  Age: 66 y.o. MRN: [id number]  CC:  Chief Complaint  Patient presents with   Annual Exam    Annual exam    HPI [name] is a 66 y.o. female with past medical history of bipolar disorder, ADD, anxiety, OSA, GERD, HLD and osteopenia who presents for annual physical.  She has been stressed today as her husband fell at home.  She had tachycardia initially, which improved later.    Bipolar disorder: She is on multiple medications for bipolar disorder and ADD. She follows up with Psychiatrist and has a therapist, who she visits every week.  GERD and hiatal hernia: She takes Protonix twice daily for it.  She was referred to general surgeon by her GI provider for hiatal hernia, but she was not able to see them due to insurance network concern.  She currently denies any nausea or vomiting, but has chronic acid reflux and epigastric pain.  Denies any melena or hematochezia currently.  Blood tests were reviewed and discussed with the patient in detail.     Past Medical History:  Diagnosis Date   ADD (attention deficit disorder)    Allergy    Seasonal   Anemia    History of GI blood loss   Anxiety    Arthritis    Atrophy of vagina 10/07/2020   Bipolar 1 disorder (HCC)    Cancer (HCC)    Colon polyps    Depression    Diabetes mellitus ([place])    Edema, lower extremity    Epistaxis    Around 2011 or 2012, required cauterization.    Esophageal stricture    Fracture of superior pubic ramus ([facility]) 11/28/2018   GERD (gastroesophageal reflux disease)    Headache(784.0)    Hyperlipidemia    Interstitial cystitis    Joint pain    Lactose intolerance    Lung cancer ([place]) 2002   Neuromuscular disorder ([place])    Obesity    Osteoarthritis    Palpitations    Sleep apnea    Doesn't use a CPAP   Suicidal ideation 01/20/2020   Swallowing difficulty    Tardive dyskinesia      Past Surgical History:  Procedure Laterality Date   BALLOON DILATION  05/16/2012   Procedure: BALLOON DILATION;  Surgeon: Inda Castle, MD;  Location: [facility];  Service: Endoscopy;  Laterality: N/A;   BUNIONECTOMY  2011   COLONOSCOPY     ENTEROSCOPY  05/16/2012   Procedure: ENTEROSCOPY;  Surgeon: Inda Castle, MD;  Location: [facility];  Service: Endoscopy;  Laterality: N/A;   JOINT REPLACEMENT     right shoulder durgery 25 yrs ago  [place]  2006, 2008   bilateral   TUBAL LIGATION  1990   WEDGE RESECTION  2002   lung cancer    Family History  Problem Relation Age of Onset   Arthritis Mother    Hearing loss Mother    Hyperlipidemia Mother    Hypertension Mother    Depression Mother    Anxiety disorder Mother    Obesity Mother    Sudden death Mother    Hypertension Father    Diabetes Mellitus II Father    Heart disease Father    Arthritis Father    Cancer Father        Brain   COPD Father    Diabetes Father  Hyperlipidemia Father    Sleep apnea Father    Early death Sister        Aneroxia/Bulimic   Depression Brother    Early death Proofreader Accident   Depression Daughter    Drug abuse Daughter    Heart disease Daughter    Hypertension Daughter    Stroke Maternal Grandmother    Hypertension Maternal Grandmother    Arthritis Maternal Grandfather    Heart attack Maternal Grandfather    Hearing loss Maternal Grandfather    Colon cancer Neg Hx    Esophageal cancer Neg Hx    Rectal cancer Neg Hx     Social History   Socioeconomic History   Marital status: Married    Spouse name: Not on file   Number of children: 1   Years of education: Not on file   Highest education level: Not on file  Occupational History   Occupation: Admin. assistant  Tobacco Use   Smoking status: Never   Smokeless tobacco: Never  Vaping Use   Vaping Use: Never used  Substance and Sexual Activity   Alcohol use: Yes    Alcohol/week:  1.0 standard drink of alcohol    Types: 1 Glasses of wine per week    Comment: Moderate   Drug use: No   Sexual activity: Yes  Other Topics Concern   Not on file  Social History Narrative   Pt lives in [place] with husband Lanny Hurst.  Followed by Dr. Clovis Pu for psychiatry and Rinaldo Cloud for therapy.   Social Determinants of Health   Financial Resource Strain: Medium Risk (05/09/2021)   Overall Financial Resource Strain (CARDIA)    Difficulty of Paying Living Expenses: Somewhat hard  Food Insecurity: No Food Insecurity (05/09/2021)   Hunger Vital Sign    Worried About Running Out of Food in the Last Year: Never true    Ran Out of Food in the Last Year: Never true  Transportation Needs: No Transportation Needs (05/09/2021)   PRAPARE - Hydrologist (Medical): No    Lack of Transportation (Non-Medical): No  Physical Activity: Inactive (05/09/2021)   Exercise Vital Sign    Days of Exercise per Week: 0 days    Minutes of Exercise per Session: 0 min  Stress: No Stress Concern Present (05/09/2021)   [place]    Feeling of Stress : Only a little  Social Connections: Moderately Integrated (05/09/2021)   Social Connection and Isolation Panel [NHANES]    Frequency of Communication with Friends and Family: More than three times a week    Frequency of Social Gatherings with Friends and Family: More than three times a week    Attends Religious Services: More than 4 times per year    Active Member of [employer] or Organizations: No    Attends Archivist Meetings: Never    Marital Status: Married  Human resources officer Violence: Not At Risk (05/09/2021)   Humiliation, Afraid, Rape, and Kick questionnaire    Fear of Current or Ex-Partner: No    Emotionally Abused: No    Physically Abused: No    Sexually Abused: No    Outpatient Medications Prior to Visit  Medication Sig Dispense Refill   acetaminophen  (TYLENOL) 650 MG CR tablet Take 1,300 mg by mouth as needed for pain.     atorvastatin (LIPITOR) 20 MG tablet TAKE (1) TABLET BY MOUTH AT BEDTIME.  90 tablet 0   chlordiazePOXIDE (LIBRIUM) 10 MG capsule TAKE (1) CAPSULE BY MOUTH AT BEDTIME. 30 capsule 0   cloZAPine (CLOZARIL) 25 MG tablet Take 2 tablets (50 mg total) by mouth at bedtime. 14 tablet 3   dexmethylphenidate (FOCALIN) 10 MG tablet TAKE 1 AND 1/2 TABLETS BY MOUTH TWICE DAILY. 90 tablet 0   lamoTRIgine (LAMICTAL) 200 MG tablet TAKE (1) TABLET BY MOUTH TWICE DAILY. 180 tablet 0   lithium carbonate 150 MG capsule TAKE (1) CAPSULE BY MOUTH AT BEDTIME. 90 capsule 0   Melatonin 10 MG CAPS Take by mouth at bedtime as needed.     pantoprazole (PROTONIX) 40 MG tablet Take 1 tablet (40 mg total) by mouth 2 (two) times daily. 180 tablet 3   valbenazine (INGREZZA) 40 MG capsule Take 1 capsule (40 mg total) by mouth daily. 30 capsule 5   cloNIDine (CATAPRES) 0.1 MG tablet TAKE 1/2 TAB IN THE MORNING AND 1 TAB AT BEDTIME. (Patient not taking: Reported on 03/22/2022) 45 tablet 0   ferrous gluconate (FERGON) 324 MG tablet Take 324 mg by mouth daily with breakfast. (Patient not taking: Reported on 03/22/2022)     No facility-administered medications prior to visit.    Allergies  Allergen Reactions   Azithromycin Anaphylaxis   Penicillins Anaphylaxis    DID THE REACTION INVOLVE: Swelling of the face/tongue/throat, SOB, or low BP? Yes Sudden or severe rash/hives, skin peeling, or the inside of the mouth or nose? Yes Did it require medical treatment? No When did it last happen?       If all above answers are "NO", may proceed with cephalosporin use.  Patient reacts to Z pack.  HAS Taken amoxicillin fine.   Adhesive [Tape] Other (See Comments)    On bandaids    ROS Review of Systems  Constitutional:  Negative for chills and fever.  HENT:  Negative for congestion, sinus pressure, sinus pain and sore throat.   Eyes:  Negative for pain and  discharge.  Respiratory:  Negative for cough and shortness of breath.   Cardiovascular:  Negative for palpitations.  Gastrointestinal:  Positive for abdominal pain. Negative for diarrhea, nausea and vomiting.  Endocrine: Negative for polydipsia and polyuria.  Genitourinary:  Negative for dysuria and hematuria.  Musculoskeletal:  Negative for neck pain and neck stiffness.  Skin:  Negative for rash.  Neurological:  Negative for dizziness and weakness.  Psychiatric/Behavioral:  Positive for sleep disturbance. Negative for agitation and behavioral problems. The patient is nervous/anxious.       Objective:    Physical Exam Vitals reviewed.  Constitutional:      General: She is not in acute distress.    Appearance: She is obese. She is not diaphoretic.  HENT:     Head: Normocephalic and atraumatic.     Nose: Nose normal. No nasal tenderness.     Right Sinus: No maxillary sinus tenderness.     Left Sinus: No maxillary sinus tenderness.     Mouth/Throat:     Mouth: Mucous membranes are moist.  Eyes:     General: No scleral icterus.    Extraocular Movements: Extraocular movements intact.  Cardiovascular:     Rate and Rhythm: Normal rate and regular rhythm.     Pulses: Normal pulses.     Heart sounds: Normal heart sounds. No murmur heard. Pulmonary:     Breath sounds: Normal breath sounds. No wheezing or rales.  Abdominal:     Palpations: Abdomen is soft.  Tenderness: There is no abdominal tenderness.  Musculoskeletal:     Cervical back: Neck supple. No tenderness.     Right lower leg: No edema.     Left lower leg: No edema.  Skin:    General: Skin is warm.     Findings: No rash.  Neurological:     General: No focal deficit present.     Mental Status: She is alert and oriented to person, place, and time.     Sensory: No sensory deficit.     Motor: No weakness.     Gait: Gait abnormal.     Comments: Resting tremor of right hand  Psychiatric:        Behavior: Behavior  normal.        Thought Content: Thought content normal.     BP 122/82 (BP Location: Right Arm, Patient Position: Sitting, Cuff Size: Normal)   Pulse 96   Resp 18   Ht 5' (1.524 m)   Wt 189 lb 6.4 oz (85.9 kg)   SpO2 94%   BMI 36.99 kg/m  Wt Readings from Last 3 Encounters:  03/22/22 189 lb 6.4 oz (85.9 kg)  02/02/22 182 lb (82.6 kg)  10/22/21 181 lb (82.1 kg)    Lab Results  Component Value Date   TSH 2.020 03/18/2022   Lab Results  Component Value Date   WBC 6.3 03/18/2022   HGB 13.0 03/18/2022   HCT 40.8 03/18/2022   MCV 91 03/18/2022   PLT 261 03/18/2022   Lab Results  Component Value Date   NA 141 03/18/2022   K 4.2 03/18/2022   CO2 22 03/18/2022   GLUCOSE 100 (H) 03/18/2022   BUN 10 03/18/2022   CREATININE 0.87 03/18/2022   BILITOT 0.5 03/18/2022   ALKPHOS 83 03/18/2022   AST 17 03/18/2022   ALT 10 03/18/2022   PROT 6.8 03/18/2022   ALBUMIN 4.3 03/18/2022   CALCIUM 9.3 03/18/2022   ANIONGAP 8 01/19/2020   EGFR 73 03/18/2022   GFR 69.31 04/04/2019   Lab Results  Component Value Date   CHOL 202 (H) 03/18/2022   Lab Results  Component Value Date   HDL 84 03/18/2022   Lab Results  Component Value Date   LDLCALC 107 (H) 03/18/2022   Lab Results  Component Value Date   TRIG 63 03/18/2022   Lab Results  Component Value Date   CHOLHDL 2.4 03/18/2022   Lab Results  Component Value Date   HGBA1C 5.7 (H) 03/18/2022      Assessment & Plan:   Problem List Items Addressed This Visit       Respiratory   Hiatal hernia    Has progressive epigastric pain, likely due to hiatal hernia Referred to general surgery      Relevant Orders   Ambulatory referral to General Surgery     Digestive   GERD (gastroesophageal reflux disease)    Usually well-controlled, on Pantoprazole 40 mg BID -and advised to avoid twice daily dosing Advised to avoid hot or spicy food Try to be sitting upright at least for 30 minutes after meal        Nervous and  Auditory   Parkinsonism ([place])    F/u with Neurology Not on any medication currently, prefers to avoid taking medications for now        Other   Bipolar disorder ([place])    On Lithium, Lamictal, Ingrezza, clozapine and Focalin Follows up with Psychiatry      Hyperlipidemia  On Atorvastatin Lipid profile reviewed      Encounter for general adult medical examination with abnormal findings - Primary    Physical exam as documented. Counseling done  re healthy lifestyle involving commitment to 150 minutes exercise per week, heart healthy diet, and attaining healthy weight.The importance of adequate sleep also discussed. Changes in health habits are decided on by the patient with goals and time frames  set for achieving them. Immunization and cancer screening needs are specifically addressed at this visit.       No orders of the defined types were placed in this encounter.   Follow-up: Return in about 6 months (around 09/22/2022) for GERD and HLD.    Lindell Spar, MD

## 2022-03-22 NOTE — Assessment & Plan Note (Signed)
Usually well-controlled, on Pantoprazole 40 mg BID -and advised to avoid twice daily dosing Advised to avoid hot or spicy food Try to be sitting upright at least for 30 minutes after meal

## 2022-03-22 NOTE — Assessment & Plan Note (Signed)
Has progressive epigastric pain, likely due to hiatal hernia Referred to general surgery

## 2022-03-23 ENCOUNTER — Encounter: Payer: Self-pay | Admitting: Psychiatry

## 2022-03-23 ENCOUNTER — Ambulatory Visit: Payer: No Typology Code available for payment source | Admitting: Psychiatry

## 2022-03-23 VITALS — BP 117/78 | HR 95

## 2022-03-23 DIAGNOSIS — F900 Attention-deficit hyperactivity disorder, predominantly inattentive type: Secondary | ICD-10-CM

## 2022-03-23 DIAGNOSIS — F319 Bipolar disorder, unspecified: Secondary | ICD-10-CM | POA: Diagnosis not present

## 2022-03-23 DIAGNOSIS — G2401 Drug induced subacute dyskinesia: Secondary | ICD-10-CM | POA: Diagnosis not present

## 2022-03-23 DIAGNOSIS — G3184 Mild cognitive impairment, so stated: Secondary | ICD-10-CM | POA: Diagnosis not present

## 2022-03-23 DIAGNOSIS — Z79899 Other long term (current) drug therapy: Secondary | ICD-10-CM | POA: Diagnosis not present

## 2022-03-23 DIAGNOSIS — F411 Generalized anxiety disorder: Secondary | ICD-10-CM | POA: Diagnosis not present

## 2022-03-23 DIAGNOSIS — F5105 Insomnia due to other mental disorder: Secondary | ICD-10-CM

## 2022-03-23 MED ORDER — CLOZAPINE 50 MG PO TABS: 50.0000 mg | ORAL_TABLET | Freq: Every day | ORAL | 2 refills | Status: DC

## 2022-03-23 NOTE — Progress Notes (Signed)
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 having mouth movements and biting tongue.  Sometimes hard to talk.  Drooling.  When tries to increase clozapine slurred speech and severe dizziness.   Mood is a little better.   Sleeping 8-9 hours.  So much better sleep with clozapine.   Still has anxiety, generalized. Plan: To minimize polypharmacy and improve tolerabilty:  Reduce Equetro to 1 of the 100 mg capsule at night for 1 week, then stop it. Wait 1 week then stop the carbamazepine chewable. Wait 1 more week then reduce clonidine to 1/2 at night for 1 week then stop it. Plan: Started clozapine  and continue 25 mg for now bc hasn't tolerated more so far.  02/08/22 appt noted: Mouth movements and biting tongue.  Sores on cheek with constant chewing movements. Sometimes hard to talk. Hypersalivation gets worse as day progresses. Sometimes balance problems.  Constipation managed.   Sleep very well.  8-9 hours. Off Equetro and on clozapine. Still has depression and anxiety without much change Plan: Started clozapine  and continue 25 mg for now bc hasn't tolerated more and need to start Ingrezza 40 mg for  TD.  03/23/2022 appointment with the following noted: Several phone calls since here.  Has gotten up to clozapine 37.5 mg nightly. Continues Focalin 15 mg twice daily, lamotrigine 200 mg twice daily, lithium 150 nightly. Started Ingrezza 40 mg daily. Ingrezza amazing difference but even with grant of $10000 can't afford it.  Not biting mouth and mouth less sore.  Less mouth movements but not gone Emotionally not real well with anxiety and depression and crying spells.  Also some irritability and anger.  Easily triggered anger. Sleep more broken with Ingrezza HS but 8-9 hours. Can be sedated if gets up early with slurred speech but not if full night sleep. Balance better off Equetro.  Past Psychiatric Medication Trials: Vraylar 4.5 SE mouth movements reduced to 3 mg 3/20. It was effective at lower doses for depression.  Worse off it.  Vraylar 1.5 mg every third day led to relapse of significant depression. Caplyta SE at 42 mg .  Cost problems Latuda 80, , olanzapine, Seroquel, risperidone, Abilify, loxapine 25 mg BID (max tolerated) NR, Clozapine 37.5  lithium 150,  Trileptal 450, Depakote, Equetro 300 hx balance issues, CBZ ER falling,   Lamictal 200 twice daily,  Focalin,  Ritalin,  fluoxetine 60,  sertraline 100, Wellbutrin history of facial tics, paroxetine cognitive side effects, Lexapro 10 worse buspirone,  ropinirole, amantadine, Sinemet, Artane, Cogentin,  pramipexole 0.5 mg BID helped tremor but caused anger trazodone hangover, Ambien hangover,  Review of Systems:  Review of Systems  HENT:  Positive for dental problem and tinnitus.        Chirping cricket sounds in hears since January  Cardiovascular:  Negative for palpitations.  Gastrointestinal:  Negative for nausea.  Musculoskeletal:  Positive for arthralgias and gait problem.  Neurological:  Positive for dizziness. Negative for tremors, seizures and syncope.       Ankle problems and balance problems. No falls  lately. Occ stumbles. Tremor is better in hands Mouth movements  Psychiatric/Behavioral:  Positive for decreased concentration and dysphoric mood. Negative for agitation, behavioral problems, confusion, hallucinations, self-injury, sleep disturbance and suicidal ideas. The patient is nervous/anxious. The patient is not hyperactive.   No falls since here. Not currently depressed but unable to remove this from the list.  Medications: I have reviewed the patient's current medications.  Current Outpatient Medications  Medication Sig Dispense Refill   acetaminophen (TYLENOL) 650 MG CR tablet Take 1,300 mg by mouth as needed for pain.     atorvastatin (LIPITOR) 20 MG tablet TAKE (1) TABLET BY MOUTH AT BEDTIME. 90 tablet 0   chlordiazePOXIDE (LIBRIUM) 10 MG capsule TAKE (1) CAPSULE BY MOUTH AT BEDTIME. 30 capsule 0   dexmethylphenidate (FOCALIN) 10 MG tablet TAKE 1 AND 1/2 TABLETS BY MOUTH TWICE DAILY. 90 tablet 0   lamoTRIgine (LAMICTAL) 200 MG tablet TAKE (1) TABLET BY MOUTH TWICE DAILY. 180 tablet 0   lithium carbonate 150 MG capsule TAKE (1) CAPSULE BY MOUTH AT BEDTIME. 90 capsule 0   Melatonin 10 MG CAPS Take by mouth at bedtime as needed.     pantoprazole (PROTONIX) 40 MG tablet Take 1 tablet (40 mg total) by mouth 2 (two) times daily. 180 tablet 3   valbenazine (INGREZZA) 40 MG capsule Take 1 capsule (40 mg total) by mouth daily. 30 capsule 5   cloZAPine (CLOZARIL) 50 MG tablet Take 1 tablet (50 mg total) by mouth at bedtime. 7 tablet 2   No current facility-administered medications for this visit.    Medication Side Effects: Other: tremor and weight gain.   Dyskinesia appears better  SE bettter than they were.  Balance problems intermittently  Allergies:  Allergies  Allergen Reactions   Azithromycin Anaphylaxis   Penicillins Anaphylaxis    DID THE REACTION INVOLVE: Swelling of the face/tongue/throat, SOB, or low BP? Yes Sudden or severe rash/hives, skin peeling, or the inside of the  mouth or nose? Yes Did it require medical treatment? No When did it last happen?       If all above answers are "NO", may proceed with cephalosporin use.  Patient reacts to Z pack.  HAS Taken amoxicillin fine.   Adhesive [Tape] Other (See Comments)    On bandaids    Past Medical History:  Diagnosis Date   ADD (attention deficit disorder)    Allergy    Seasonal   Anemia    History of GI blood loss   Anxiety    Arthritis    Atrophy of vagina 10/07/2020   Bipolar 1 disorder (HCC)    Cancer (HCC)    Colon polyps    Depression  Diabetes mellitus ([place])    Edema, lower extremity    Epistaxis    Around 2011 or 2012, required cauterization.    Esophageal stricture    Fracture of superior pubic ramus (HCC) 11/28/2018   GERD (gastroesophageal reflux disease)    Headache(784.0)    Hyperlipidemia    Interstitial cystitis    Joint pain    Lactose intolerance    Lung cancer ([place]) 2002   Neuromuscular disorder ([place])    Obesity    Osteoarthritis    Palpitations    Sleep apnea    Doesn't use a CPAP   Suicidal ideation 01/20/2020   Swallowing difficulty    Tardive dyskinesia     Family History  Problem Relation Age of Onset   Arthritis Mother    Hearing loss Mother    Hyperlipidemia Mother    Hypertension Mother    Depression Mother    Anxiety disorder Mother    Obesity Mother    Sudden death Mother    Hypertension Father    Diabetes Mellitus II Father    Heart disease Father    Arthritis Father    Cancer Father        Brain   COPD Father    Diabetes Father    Hyperlipidemia Father    Sleep apnea Father    Early death Sister        Aneroxia/Bulimic   Depression Brother    Early death Proofreader Accident   Depression Daughter    Drug abuse Daughter    Heart disease Daughter    Hypertension Daughter    Stroke Maternal Grandmother    Hypertension Maternal Grandmother    Arthritis Maternal Grandfather    Heart attack Maternal Grandfather    Hearing  loss Maternal Grandfather    Colon cancer Neg Hx    Esophageal cancer Neg Hx    Rectal cancer Neg Hx     Social History   Socioeconomic History   Marital status: Married    Spouse name: Not on file   Number of children: 1   Years of education: Not on file   Highest education level: Not on file  Occupational History   Occupation: Admin. assistant  Tobacco Use   Smoking status: Never   Smokeless tobacco: Never  Vaping Use   Vaping Use: Never used  Substance and Sexual Activity   Alcohol use: Yes    Alcohol/week: 1.0 standard drink of alcohol    Types: 1 Glasses of wine per week    Comment: Moderate   Drug use: No   Sexual activity: Yes  Other Topics Concern   Not on file  Social History Narrative   Pt lives in [place] with husband Lanny Hurst.  Followed by Dr. Clovis Pu for psychiatry and Rinaldo Cloud for therapy.   Social Determinants of Health   Financial Resource Strain: Medium Risk (05/09/2021)   Overall Financial Resource Strain (CARDIA)    Difficulty of Paying Living Expenses: Somewhat hard  Food Insecurity: No Food Insecurity (05/09/2021)   Hunger Vital Sign    Worried About Running Out of Food in the Last Year: Never true    Ran Out of Food in the Last Year: Never true  Transportation Needs: No Transportation Needs (05/09/2021)   PRAPARE - Hydrologist (Medical): No    Lack of Transportation (Non-Medical): No  Physical Activity: Inactive (05/09/2021)   Exercise Vital Sign    Days  of Exercise per Week: 0 days    Minutes of Exercise per Session: 0 min  Stress: No Stress Concern Present (05/09/2021)   [place]    Feeling of Stress : Only a little  Social Connections: Moderately Integrated (05/09/2021)   Social Connection and Isolation Panel [NHANES]    Frequency of Communication with Friends and Family: More than three times a week    Frequency of Social Gatherings with Friends and  Family: More than three times a week    Attends Religious Services: More than 4 times per year    Active Member of [employer] or Organizations: No    Attends Archivist Meetings: Never    Marital Status: Married  Human resources officer Violence: Not At Risk (05/09/2021)   Humiliation, Afraid, Rape, and Kick questionnaire    Fear of Current or Ex-Partner: No    Emotionally Abused: No    Physically Abused: No    Sexually Abused: No    Past Medical History, Surgical history, Social history, and Family history were reviewed and updated as appropriate.   Please see review of systems for further details on the patient's review from today.   Objective:   Physical Exam:  BP 117/78   Pulse 95   Physical Exam Constitutional:      General: She is not in acute distress. Musculoskeletal:        General: No deformity.  Neurological:     Mental Status: She is alert and oriented to person, place, and time.     Cranial Nerves: No dysarthria.     Motor: Tremor present.     Coordination: Coordination normal.     Comments: Mild resting R hand rotational tremor better but not gone. Fidgety mildy with feet better Some buccolingual tremor is better  Psychiatric:        Attention and Perception: Attention and perception normal. She does not perceive auditory hallucinations.        Mood and Affect: Mood is anxious and depressed. Affect is not labile, blunt, angry or inappropriate.        Speech: Speech normal. Speech is not rapid and pressured.        Behavior: Behavior normal. Behavior is not agitated. Behavior is cooperative.        Thought Content: Thought content normal. Thought content is not paranoid or delusional. Thought content does not include homicidal or suicidal ideation. Thought content does not include suicidal plan.        Cognition and Memory: Cognition and memory normal.        Judgment: Judgment normal.     Comments: Insight intact more mood cycling and still anxious. Still  anxious Overall mood a little better with clozapine.      Lab Review:     Component Value Date/Time   NA 141 03/18/2022 1021   K 4.2 03/18/2022 1021   CL 104 03/18/2022 1021   CO2 22 03/18/2022 1021   GLUCOSE 100 (H) 03/18/2022 1021   GLUCOSE 102 (H) 01/19/2020 1158   BUN 10 03/18/2022 1021   CREATININE 0.87 03/18/2022 1021   CALCIUM 9.3 03/18/2022 1021   PROT 6.8 03/18/2022 1021   ALBUMIN 4.3 03/18/2022 1021   AST 17 03/18/2022 1021   ALT 10 03/18/2022 1021   ALKPHOS 83 03/18/2022 1021   BILITOT 0.5 03/18/2022 1021   GFRNONAA 83 03/02/2020 1433   GFRAA 96 03/02/2020 1433       Component Value  Date/Time   WBC 6.3 03/18/2022 1021   WBC 7.4 01/19/2020 1158   RBC 4.50 03/18/2022 1021   RBC 4.19 01/19/2020 1158   HGB 13.0 03/18/2022 1021   HCT 40.8 03/18/2022 1021   PLT 261 03/18/2022 1021   MCV 91 03/18/2022 1021   MCH 28.9 03/18/2022 1021   MCH 30.3 01/19/2020 1158   MCHC 31.9 03/18/2022 1021   MCHC 32.1 01/19/2020 1158   RDW 12.8 03/18/2022 1021   LYMPHSABS 2.1 03/18/2022 1021   MONOABS 0.7 04/04/2019 1004   EOSABS 0.4 03/18/2022 1021   BASOSABS 0.0 03/18/2022 1021  Vitamin D level 33 on 10K units daily on 12/4/`9 Increased to prescription vitamin d 50K units Monday, Wed, Friday.  Rx sent in.   Lithium Lvl  Date Value Ref Range Status  10/21/2018 0.18 (L) 0.60 - 1.20 mmol/L Final    Comment:    Performed at [facility], [address], [place], [place] [address]     No results found for: "PHENYTOIN", "PHENOBARB", "VALPROATE", "CBMZ"   .res Assessment: Plan:    Bipolar I disorder with rapid cycling ([place]) - Plan: cloZAPine (CLOZARIL) 50 MG tablet  Generalized anxiety disorder  Tardive dyskinesia  Insomnia due to mental condition  Mild cognitive impairment  Attention deficit hyperactivity disorder (ADHD), predominantly inattentive type  Long term current use of clozapine  Drinda has chronic rapid cycling bipolar disorder which is chronically  unstable and has been difficult to control.  The rapid cycling is making it difficult to control frequency of depressive episodes and the anxiety as well.  We have typically had to make frequent med changes.  Mood unchanged with clozapine 37.5 mg HS  Started clozapine but needs to increase bc minimal effect and better tolerance, so increase to 50 mg HS  Ingrezza 40 mg for TD helped.  Continue at this dose using grant as long as possible.  Disc ECT. Only FDA approved option left. However she decided to pursue clozapine and is up to 25 mg for 1 night. Extensive discussion of clozapine dosing recommendations but we will increase more slowly bc she is so med sensitive.Disc risk low WBC, cardiomyopathy, etc, sedation Disc weekly labs etc and REMS program.    Prone to UTI and may be causing confusion discussed.  Had confirmed UTI recently.  Out of work ADD less of a problem.  Discussed the risk of stimulants including that that could contribute to mood instability and mood swings.    She has a high residual anxiety.  It has been impossible to control all of her symptoms simultaneously without causing side effects. Failed various meds.  She agrees.  Continue the following Discussed side effects of each medicine. Continue   Focalin XR 20 mg  To IR 15 mg BID DT Cost and off label for depression  Try to spread this out if possible for mood.  She feels this is necessary  Lamotrigine 200 mg twice daily Lithium 150 mg nightly  Disc risk worsening TD and tremor. Failed all reasonable alternatives for anxiety.  Option viibryd Librium to 10 mg HS DT ? Effect. Plan to stop after other meds stopped.  Discussed potential metabolic side effects associated with atypical antipsychotics, as well as potential risk for movement side effects. Advised pt to contact office if movement side effects occur.  She may be having some mild TD with toe movement and grimacing.  Disc this in detail.  At this point it's  tolerable.  . Suspect it.  "I  don't realize it". But sometimes others notice  Worsening TD manageable.  Disc possible Ingrezza etc but defer DT poloypharmacy  Option reduce Librium but she has no hangover No additonal sedatives during the day which fight the benefit of the stimulants and be more likely to trigger depression and poor activitty level. We discussed the short-term risks associated with benzodiazepines including sedation and increased fall risk among others.  Discussed long-term side effect risk including dependence, potential withdrawal symptoms, and the potential eventual dose-related risk of dementia.  But recent studies from 2020 dispute this association between benzodiazepines and dementia risk. Newer studies in 2020 do not support an association with dementia.  Disc SE meds and this is heightened by the complication of necessary polypharmacy.  Supportive therapy in terms of dealing with husband's addiction and now new dx metastatic prostate CA.Marland Kitchen Disc poss PT job to help with socialization. Sleep hygiene disc and not layingin bed awake for long periods.  Requires frequent FU DT chronic instability.  Wants to schedule monthly.  FU 4 weeks. Call next week and hope to increase clozapine  Lynder Parents MD, DFAPA  Please see After Visit Summary for patient specific instructions.    Future Appointments  Date Time Provider [place]  03/31/2022 10:00 AM Shanon Ace, LCSW CP-CP None  04/05/2022 10:00 AM Cottle, Billey Co., MD CP-CP None  04/21/2022 10:00 AM Shanon Ace, LCSW CP-CP None  05/10/2022  2:30 PM Cottle, Billey Co., MD CP-CP None  05/12/2022 10:00 AM Shanon Ace, LCSW CP-CP None  05/16/2022  9:30 AM RPC-RPC NURSE R[facility]-RPC RPC  06/02/2022 10:00 AM Shanon Ace, LCSW CP-CP None  06/23/2022 10:00 AM Shanon Ace, LCSW CP-CP None  09/22/2022  1:40 PM Lindell Spar, MD R[facility]-RPC RPC    No orders of the defined types were placed in this encounter.       -------------------------------

## 2022-03-24 ENCOUNTER — Encounter: Payer: Self-pay | Admitting: Psychiatry

## 2022-03-29 DIAGNOSIS — Z79899 Other long term (current) drug therapy: Secondary | ICD-10-CM | POA: Diagnosis not present

## 2022-03-29 DIAGNOSIS — F319 Bipolar disorder, unspecified: Secondary | ICD-10-CM | POA: Diagnosis not present

## 2022-03-31 ENCOUNTER — Ambulatory Visit: Payer: No Typology Code available for payment source | Admitting: Psychiatry

## 2022-03-31 ENCOUNTER — Encounter: Payer: Self-pay | Admitting: Psychiatry

## 2022-04-05 ENCOUNTER — Encounter: Payer: Self-pay | Admitting: Psychiatry

## 2022-04-05 ENCOUNTER — Ambulatory Visit: Payer: No Typology Code available for payment source | Admitting: Psychiatry

## 2022-04-05 DIAGNOSIS — F411 Generalized anxiety disorder: Secondary | ICD-10-CM | POA: Diagnosis not present

## 2022-04-05 DIAGNOSIS — G3184 Mild cognitive impairment, so stated: Secondary | ICD-10-CM

## 2022-04-05 DIAGNOSIS — F900 Attention-deficit hyperactivity disorder, predominantly inattentive type: Secondary | ICD-10-CM

## 2022-04-05 DIAGNOSIS — F5105 Insomnia due to other mental disorder: Secondary | ICD-10-CM | POA: Diagnosis not present

## 2022-04-05 DIAGNOSIS — R251 Tremor, unspecified: Secondary | ICD-10-CM | POA: Diagnosis not present

## 2022-04-05 DIAGNOSIS — G2401 Drug induced subacute dyskinesia: Secondary | ICD-10-CM

## 2022-04-05 DIAGNOSIS — F319 Bipolar disorder, unspecified: Secondary | ICD-10-CM | POA: Diagnosis not present

## 2022-04-05 DIAGNOSIS — Z79899 Other long term (current) drug therapy: Secondary | ICD-10-CM

## 2022-04-05 MED ORDER — CLOZAPINE 50 MG PO TABS: 75.0000 mg | ORAL_TABLET | Freq: Every day | ORAL | 3 refills | Status: DC

## 2022-04-05 MED ORDER — CHLORDIAZEPOXIDE HCL 5 MG PO CAPS: 5.0000 mg | ORAL_CAPSULE | Freq: Every evening | ORAL | 0 refills | Status: DC

## 2022-04-05 NOTE — Progress Notes (Signed)
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 movements and biting tongue.  Sometimes hard to talk.  Drooling.  When tries to increase clozapine slurred speech and severe dizziness.   Mood is a little better.   Sleeping 8-9 hours.  So much better sleep with clozapine.   Still has anxiety, generalized. Plan: To minimize polypharmacy and improve tolerabilty:  Reduce Equetro to 1 of the 100 mg capsule at night for 1 week, then stop it. Wait 1 week then stop the carbamazepine chewable. Wait 1 more week then reduce clonidine to 1/2 at night for 1 week then stop it. Plan: Started clozapine  and continue 25 mg for now bc hasn't tolerated more so far.  02/08/22 appt noted: Mouth movements and biting tongue.  Sores on cheek with constant chewing movements. Sometimes hard to talk. Hypersalivation gets worse as day progresses. Sometimes balance problems.  Constipation managed.   Sleep very well.  8-9 hours. Off Equetro and on clozapine. Still has depression and anxiety without much change Plan: Started clozapine  and continue 25 mg for now bc hasn't tolerated more and need to start Ingrezza 40 mg for  TD.  03/23/2022 appointment with the following noted: Several phone calls since here.  Has gotten up to clozapine 37.5 mg nightly. Continues Focalin 15 mg twice daily, lamotrigine 200 mg twice daily, lithium 150 nightly. Started Ingrezza 40 mg daily. Ingrezza amazing difference but even with grant of $10000 can't afford it.  Not biting mouth and mouth less sore.  Less mouth movements but not gone Emotionally not real well with anxiety and depression and crying spells.  Also some irritability and anger.  Easily triggered anger. Sleep more broken with Ingrezza HS but 8-9 hours. Can be sedated if gets up early with slurred speech but not if full night sleep. Balance better off Equetro. Plan: Started clozapine but needs to increase bc minimal effect and better tolerance, so increase to 50 mg HS  04/05/22 appt noted: Increased clozapine to 50 mg HS.  Some groggy in the AM.  One day was dizzy.  Otherwise on occasion.  Takes it right before bed.   Still depression, hopeless, irritability and anger.  Some periods of racing thoughts.  Sometimes recognizes hypomanic episodes and somethimes doesn't recognize. Dog is  very sick and H with bone CA.  Not crying on clozapine as much. GERD and needs surgery for hiatal hernia. Signed up for water aerobics.  Past Psychiatric Medication Trials: Vraylar 4.5 SE mouth movements reduced to 3 mg 3/20. It was effective at lower doses for depression.  Worse off it.  Vraylar 1.5 mg every third day led to relapse of significant depression. Caplyta SE at 42 mg .  Cost problems Latuda 80, , olanzapine, Seroquel, risperidone, Abilify, loxapine 25 mg BID (max tolerated) NR, Clozapine 50  lithium 150,   Trileptal 450, Depakote, Equetro 300 hx balance issues, CBZ ER falling,   Lamictal 200 twice daily,  Focalin,  Ritalin,  fluoxetine 60,  sertraline 100, Wellbutrin history of facial tics, paroxetine cognitive side effects, Lexapro 10 worse buspirone,  ropinirole,  amantadine, Sinemet, Artane, Cogentin,  pramipexole 0.5 mg BID helped tremor but caused anger trazodone hangover, Ambien hangover,  Review of Systems:  Review of Systems  HENT:  Positive for dental problem and tinnitus.        Chirping cricket sounds in hears since January  Cardiovascular:  Negative for palpitations.  Gastrointestinal:  Positive for abdominal pain. Negative for nausea.       GERD awakening her  Musculoskeletal:  Positive for arthralgias and gait problem.  Neurological:  Positive for dizziness. Negative for tremors, seizures and syncope.       Ankle problems and balance problems. No falls lately. Occ stumbles. Tremor is better in hands Mouth movements  Psychiatric/Behavioral:  Positive for decreased concentration and dysphoric mood. Negative for agitation, behavioral problems, confusion, hallucinations, self-injury, sleep disturbance and suicidal ideas. The patient is nervous/anxious. The patient is not hyperactive.   No falls since here. Not currently depressed but unable to remove this from the list.  Medications: I have reviewed the patient's current medications.  Current Outpatient Medications  Medication Sig Dispense Refill   acetaminophen (TYLENOL) 650 MG CR tablet Take 1,300 mg by mouth as needed for pain.     atorvastatin (LIPITOR) 20 MG tablet TAKE (1) TABLET BY MOUTH AT BEDTIME. 90 tablet 0   chlordiazePOXIDE (LIBRIUM) 10 MG capsule TAKE (1) CAPSULE BY MOUTH AT BEDTIME. 30 capsule 0   cloZAPine (CLOZARIL) 50 MG tablet Take 1 tablet (50 mg total) by mouth at bedtime. 7 tablet 2   dexmethylphenidate (FOCALIN) 10 MG tablet TAKE 1 AND 1/2 TABLETS BY MOUTH TWICE DAILY. 90 tablet 0   lamoTRIgine (LAMICTAL) 200 MG tablet TAKE (1) TABLET BY MOUTH TWICE DAILY. 180 tablet 0   lithium carbonate 150 MG capsule TAKE (1) CAPSULE BY MOUTH AT BEDTIME. 90 capsule 0   Melatonin 10 MG CAPS Take by mouth at bedtime as needed.     pantoprazole (PROTONIX) 40 MG tablet Take 1  tablet (40 mg total) by mouth 2 (two) times daily. 180 tablet 3   valbenazine (INGREZZA) 40 MG capsule Take 1 capsule (40 mg total) by mouth daily. 30 capsule 5   No current facility-administered medications for this visit.    Medication Side Effects: Other: tremor and weight gain.   Dyskinesia appears better  SE bettter than they were.  Balance problems intermittently  Allergies:  Allergies  Allergen Reactions   Azithromycin Anaphylaxis   Penicillins Anaphylaxis    DID THE REACTION INVOLVE: Swelling of the face/tongue/throat, SOB, or low BP? Yes Sudden or severe rash/hives, skin peeling, or the inside of the mouth or nose? Yes Did it require medical treatment? No When did it last happen?  If all above answers are "NO", may proceed with cephalosporin use.  Patient reacts to Z pack.  HAS Taken amoxicillin fine.   Adhesive [Tape] Other (See Comments)    On bandaids    Past Medical History:  Diagnosis Date   ADD (attention deficit disorder)    Allergy    Seasonal   Anemia    History of GI blood loss   Anxiety    Arthritis    Atrophy of vagina 10/07/2020   Bipolar 1 disorder (HCC)    Cancer (HCC)    Colon polyps    Depression    Diabetes mellitus ([place])    Edema, lower extremity    Epistaxis    Around 2011 or 2012, required cauterization.    Esophageal stricture    Fracture of superior pubic ramus (HCC) 11/28/2018   GERD (gastroesophageal reflux disease)    Headache(784.0)    Hyperlipidemia    Interstitial cystitis    Joint pain    Lactose intolerance    Lung cancer ([place]) 2002   Neuromuscular disorder ([place])    Obesity    Osteoarthritis    Palpitations    Sleep apnea    Doesn't use a CPAP   Suicidal ideation 01/20/2020   Swallowing difficulty    Tardive dyskinesia     Family History  Problem Relation Age of Onset   Arthritis Mother    Hearing loss Mother    Hyperlipidemia Mother    Hypertension Mother    Depression Mother    Anxiety disorder Mother     Obesity Mother    Sudden death Mother    Hypertension Father    Diabetes Mellitus II Father    Heart disease Father    Arthritis Father    Cancer Father        Brain   COPD Father    Diabetes Father    Hyperlipidemia Father    Sleep apnea Father    Early death Sister        Aneroxia/Bulimic   Depression Brother    Early death Proofreader Accident   Depression Daughter    Drug abuse Daughter    Heart disease Daughter    Hypertension Daughter    Stroke Maternal Grandmother    Hypertension Maternal Grandmother    Arthritis Maternal Grandfather    Heart attack Maternal Grandfather    Hearing loss Maternal Grandfather    Colon cancer Neg Hx    Esophageal cancer Neg Hx    Rectal cancer Neg Hx     Social History   Socioeconomic History   Marital status: Married    Spouse name: Not on file   Number of children: 1   Years of education: Not on file   Highest education level: Not on file  Occupational History   Occupation: Admin. assistant  Tobacco Use   Smoking status: Never   Smokeless tobacco: Never  Vaping Use   Vaping Use: Never used  Substance and Sexual Activity   Alcohol use: Yes    Alcohol/week: 1.0 standard drink of alcohol    Types: 1 Glasses of wine per week    Comment: Moderate   Drug use: No   Sexual activity: Yes  Other Topics Concern   Not on file  Social History Narrative   Pt lives in [place] with husband Lanny Hurst.  Followed by Dr. Clovis Pu for psychiatry and Rinaldo Cloud for therapy.   Social Determinants of Health  Financial Resource Strain: Medium Risk (05/09/2021)   Overall Financial Resource Strain (CARDIA)    Difficulty of Paying Living Expenses: Somewhat hard  Food Insecurity: No Food Insecurity (05/09/2021)   Hunger Vital Sign    Worried About Running Out of Food in the Last Year: Never true    Ran Out of Food in the Last Year: Never true  Transportation Needs: No Transportation Needs (05/09/2021)   PRAPARE - Civil engineer, contracting (Medical): No    Lack of Transportation (Non-Medical): No  Physical Activity: Inactive (05/09/2021)   Exercise Vital Sign    Days of Exercise per Week: 0 days    Minutes of Exercise per Session: 0 min  Stress: No Stress Concern Present (05/09/2021)   [place]    Feeling of Stress : Only a little  Social Connections: Moderately Integrated (05/09/2021)   Social Connection and Isolation Panel [NHANES]    Frequency of Communication with Friends and Family: More than three times a week    Frequency of Social Gatherings with Friends and Family: More than three times a week    Attends Religious Services: More than 4 times per year    Active Member of [employer] or Organizations: No    Attends Archivist Meetings: Never    Marital Status: Married  Human resources officer Violence: Not At Risk (05/09/2021)   Humiliation, Afraid, Rape, and Kick questionnaire    Fear of Current or Ex-Partner: No    Emotionally Abused: No    Physically Abused: No    Sexually Abused: No    Past Medical History, Surgical history, Social history, and Family history were reviewed and updated as appropriate.   Please see review of systems for further details on the patient's review from today.   Objective:   Physical Exam:  There were no vitals taken for this visit.  Physical Exam Constitutional:      General: She is not in acute distress. Musculoskeletal:        General: No deformity.  Neurological:     Mental Status: She is alert and oriented to person, place, and time.     Cranial Nerves: No dysarthria.     Motor: Tremor present.     Coordination: Coordination normal.     Comments: Mild resting R hand rotational tremor better but not gone. Fidgety mildy with feet better Some buccolingual tremor is better  Psychiatric:        Attention and Perception: Attention and perception normal. She does not perceive auditory  hallucinations.        Mood and Affect: Mood is anxious and depressed. Affect is not labile, blunt, angry or inappropriate.        Speech: Speech normal. Speech is not rapid and pressured.        Behavior: Behavior normal. Behavior is not agitated. Behavior is cooperative.        Thought Content: Thought content normal. Thought content is not paranoid or delusional. Thought content does not include homicidal or suicidal ideation. Thought content does not include suicidal plan.        Cognition and Memory: Cognition and memory normal.        Judgment: Judgment normal.     Comments: Insight intact more mood cycling and still anxious. Still anxious Overall mood needs more improvement      Lab Review:     Component Value Date/Time   NA 141 03/18/2022 1021  K 4.2 03/18/2022 1021   CL 104 03/18/2022 1021   CO2 22 03/18/2022 1021   GLUCOSE 100 (H) 03/18/2022 1021   GLUCOSE 102 (H) 01/19/2020 1158   BUN 10 03/18/2022 1021   CREATININE 0.87 03/18/2022 1021   CALCIUM 9.3 03/18/2022 1021   PROT 6.8 03/18/2022 1021   ALBUMIN 4.3 03/18/2022 1021   AST 17 03/18/2022 1021   ALT 10 03/18/2022 1021   ALKPHOS 83 03/18/2022 1021   BILITOT 0.5 03/18/2022 1021   GFRNONAA 83 03/02/2020 1433   GFRAA 96 03/02/2020 1433       Component Value Date/Time   WBC 6.3 03/18/2022 1021   WBC 7.4 01/19/2020 1158   RBC 4.50 03/18/2022 1021   RBC 4.19 01/19/2020 1158   HGB 13.0 03/18/2022 1021   HCT 40.8 03/18/2022 1021   PLT 261 03/18/2022 1021   MCV 91 03/18/2022 1021   MCH 28.9 03/18/2022 1021   MCH 30.3 01/19/2020 1158   MCHC 31.9 03/18/2022 1021   MCHC 32.1 01/19/2020 1158   RDW 12.8 03/18/2022 1021   LYMPHSABS 2.1 03/18/2022 1021   MONOABS 0.7 04/04/2019 1004   EOSABS 0.4 03/18/2022 1021   BASOSABS 0.0 03/18/2022 1021  Vitamin D level 33 on 10K units daily on 12/4/`9 Increased to prescription vitamin d 50K units Monday, Wed, Friday.  Rx sent in.   Lithium Lvl  Date Value Ref Range  Status  10/21/2018 0.18 (L) 0.60 - 1.20 mmol/L Final    Comment:    Performed at [facility], [address], [place], [place] [address]     No results found for: "PHENYTOIN", "PHENOBARB", "VALPROATE", "CBMZ"   .res Assessment: Plan:    No diagnosis found.  Shalayah has chronic rapid cycling bipolar disorder which is chronically unstable and has been difficult to control.  The rapid cycling is making it difficult to control frequency of depressive episodes and the anxiety as well.  We have typically had to make frequent med changes.  Mood unchanged with clozapine 50 mg HS.  Disc gradual increase bc med sensitivity  Started clozapine but needs to increase bc minimal effect and better tolerance, so increase to 75 mg HS (Started clozapine on 12/07/21)  Ingrezza 40 mg for TD helped.  Continue at this dose using grant as long as possible.  Disc ECT. Only FDA approved option left. However she decided to pursue clozapine and is up to 25 mg for 1 night. Extensive discussion of clozapine dosing recommendations but we will increase more slowly bc she is so med sensitive.Disc risk low WBC, cardiomyopathy, etc, sedation Disc weekly labs etc and REMS program.    Prone to UTI and may be causing confusion discussed.  Had confirmed UTI recently.  Out of work ADD less of a problem.  Discussed the risk of stimulants including that that could contribute to mood instability and mood swings.    She has a high residual anxiety.  It has been impossible to control all of her symptoms simultaneously without causing side effects. Failed various meds.  She agrees.  Continue the following Discussed side effects of each medicine. Continue   Focalin XR 20 mg  To IR 15 mg BID DT Cost and off label for depression  Try to spread this out if possible for mood.  She feels this is necessary  Lamotrigine 200 mg twice daily Lithium 150 mg nightly  Disc risk worsening TD and tremor. Failed all reasonable alternatives  for anxiety.  Option viibryd Reduce Librium to  5 mg HS DT clozapine helping sleep. Plan to stop after this fill.  Discussed potential metabolic side effects associated with atypical antipsychotics, as well as potential risk for movement side effects. Advised pt to contact office if movement side effects occur.  She may be having some mild TD with toe movement and grimacing.  Disc this in detail.  At this point it's tolerable.  . Suspect it.  "I don't realize it". But sometimes others notice  Worsening TD manageable.  Disc possible Ingrezza etc but defer DT poloypharmacy  Option reduce Librium but she has no hangover No additonal sedatives during the day which fight the benefit of the stimulants and be more likely to trigger depression and poor activitty level. We discussed the short-term risks associated with benzodiazepines including sedation and increased fall risk among others.  Discussed long-term side effect risk including dependence, potential withdrawal symptoms, and the potential eventual dose-related risk of dementia.  But recent studies from 2020 dispute this association between benzodiazepines and dementia risk. Newer studies in 2020 do not support an association with dementia.  Disc SE meds and this is heightened by the complication of necessary polypharmacy.  Supportive therapy in terms of dealing with husband's addiction and now new dx metastatic prostate CA.Marland Kitchen Disc poss PT job to help with socialization. Sleep hygiene disc and not layingin bed awake for long periods.  Requires frequent FU DT chronic instability.  Wants to schedule monthly.  FU 4 weeks. Call next week and hope to increase clozapine  Lynder Parents MD, DFAPA  Please see After Visit Summary for patient specific instructions.    Future Appointments  Date Time Provider [place]  04/21/2022 10:00 AM Shanon Ace, LCSW CP-CP None  05/10/2022  2:30 PM Cottle, Billey Co., MD CP-CP None  05/12/2022 10:00 AM  Shanon Ace, LCSW CP-CP None  05/16/2022  9:30 AM RPC-RPC NURSE RPC-RPC RPC  06/02/2022 10:00 AM Shanon Ace, LCSW CP-CP None  06/09/2022 10:00 AM Cottle, Billey Co., MD CP-CP None  06/23/2022 10:00 AM Shanon Ace, LCSW CP-CP None  07/07/2022 10:00 AM Cottle, Billey Co., MD CP-CP None  09/22/2022  1:40 PM Lindell Spar, MD RPC-RPC RPC    No orders of the defined types were placed in this encounter.      -------------------------------

## 2022-04-06 ENCOUNTER — Encounter: Payer: Self-pay | Admitting: Psychiatry

## 2022-04-08 ENCOUNTER — Telehealth: Payer: Self-pay

## 2022-04-08 NOTE — Telephone Encounter (Signed)
[facility] called [phone] ext [phone] Asked if patient suppose to still be taking the  atorvastatin (LIPITOR) 20 MG tablet  If so please send in a new prescription.   Pharmacy  [place], [place] - [place]  [address], [place] [id number]  Phone:  [phone]  Fax:  [phone]

## 2022-04-11 ENCOUNTER — Other Ambulatory Visit: Payer: Self-pay | Admitting: *Deleted

## 2022-04-11 DIAGNOSIS — E7849 Other hyperlipidemia: Secondary | ICD-10-CM

## 2022-04-11 MED ORDER — ATORVASTATIN CALCIUM 20 MG PO TABS: ORAL_TABLET | ORAL | 0 refills | Status: DC

## 2022-04-11 NOTE — Telephone Encounter (Signed)
Medication refill sent to pharmacy  

## 2022-04-12 ENCOUNTER — Ambulatory Visit: Payer: No Typology Code available for payment source | Admitting: Internal Medicine

## 2022-04-12 DIAGNOSIS — Z79899 Other long term (current) drug therapy: Secondary | ICD-10-CM | POA: Diagnosis not present

## 2022-04-12 DIAGNOSIS — F319 Bipolar disorder, unspecified: Secondary | ICD-10-CM | POA: Diagnosis not present

## 2022-04-13 ENCOUNTER — Encounter (INDEPENDENT_AMBULATORY_CARE_PROVIDER_SITE_OTHER): Payer: Self-pay

## 2022-04-13 ENCOUNTER — Other Ambulatory Visit: Payer: Self-pay | Admitting: Internal Medicine

## 2022-04-13 DIAGNOSIS — E7849 Other hyperlipidemia: Secondary | ICD-10-CM

## 2022-04-19 DIAGNOSIS — Z79899 Other long term (current) drug therapy: Secondary | ICD-10-CM | POA: Diagnosis not present

## 2022-04-19 DIAGNOSIS — F319 Bipolar disorder, unspecified: Secondary | ICD-10-CM | POA: Diagnosis not present

## 2022-04-21 ENCOUNTER — Ambulatory Visit (INDEPENDENT_AMBULATORY_CARE_PROVIDER_SITE_OTHER): Payer: No Typology Code available for payment source | Admitting: Psychiatry

## 2022-04-21 DIAGNOSIS — F319 Bipolar disorder, unspecified: Secondary | ICD-10-CM

## 2022-04-21 NOTE — Progress Notes (Signed)
[facility] Counselor/Therapist Progress Note  Patient ID: Megan Whitney, MRN: [id number],    Date: 04/21/2022  Time Spent: 55 minutes   Treatment Type: Individual Therapy  Reported Symptoms: anxiety, depression, fear, tearful sometimes, confused at times,   Mental Status Exam:  Appearance:   Neat     Behavior:  Appropriate, Sharing, and Motivated  Motor:  Normal  Speech/Language:   Clear and Coherent  Affect:  Anxious, depressed  Mood:  anxious and depressed  Thought process:  goal directed  Thought content:    Overthinking and worrying  Sensory/Perceptual disturbances:    WNL  Orientation:  oriented to person, place, time/date, situation, day of week, month of year, year, and stated date of April 21, 2022  Attention:  Fair  Concentration:  Fair  Memory:  Some short term memory concern  Fund of knowledge:   Good  Insight:    Good and Fair  Judgment:   Good and Fair  Impulse Control:  Good and Fair   Risk Assessment: Danger to Self:  No Self-injurious Behavior: No Danger to Others: No Duty to Warn:no Physical Aggression / Violence:No  Access to Firearms a concern: No  Gang Involvement:No   Subjective: Patient today reporting anxiety, depression, some confusion at times, and occasional fears and imagining "what might go wrong versus right" and other worst case scenarios. Husband with cancer "is about the same" but did fall in yard recently and sustained a "slight fracture in a hip" and he's been self-medicating with alcohol, which stresses patient. Daughter in law is being supportive of patient and husband. Anger at husband for taking extra dose of oxycodone and had to go to ER, "but he is ok now."  Shared and processed today a lot of stress and frustration in session today in reference to family and personal issues and Not Having Control over some of the stressors in my life."I wish I had control over my husband's drinking and my daughter", which she processed at  length in session today.  Trying to manage what she can control and cannot control and being able to let go when she needs to.  Denies any thoughts to harm self or others.  Interventions: Cognitive Behavioral Therapy, Solution-Oriented/Positive Psychology, and Ego-Supportive  Long Term Goal: Reduce overall level, frequency, and intensity of the anxiety so that daily functioning is not impaired. Short Term Goal: 1.Increase understanding of the beliefs and messages that produce the worry and anxiety. Strategies: 1.Help client develop reality-based positive cognitive messages/self-talk. 2. Develop a "coping card" or other reminder which coping strategies are recorded for patient's later use.  Diagnosis:   ICD-10-CM   1. Bipolar I disorder with rapid cycling ([place])  F31.9      Plan: Patient today showing active participation and good motivation in working on her anxiety and depressive symptoms and what tends to aggravate them, as well as some occasional fears and imagining worst case scenarios and looking for what might go wrong versus right.  It has it is really hard for her to deal with things that she cannot control and realizes at times how she tries to control things that she cannot.  As noted above there were a couple of stressors that have bothered her the most recently including her daughter not speaking to her and husband's self medicate with alcohol and other substances even though his doctors have advised him against that.  Patient did quite well today in talking through her anxiety and depression and  stress and did seem to feel more calm and grounded by session and as well as hoping to be able to use better strategies and tools as discussed in session today, when things happen that are out of her control and frustrate or anger her.  Reviewed goal-directed behaviors including specific examples, to help patient and realizing she does have some resources she can use in coping.  Was very  appreciative at session and and was encouraged to remain on her medications as prescribed which she says she is doing. Encouraged patient in practicing more positive behaviors as discussed in sessions including: Focus on making sound decisions, setting limits with others who are not supportive, believing more in herself and her ability to manage difficult and unpredictable circumstances, continued contact with supportive friends and family, use of journaling between sessions as well as some reading that has proven to be helpful to her in the past, taking breaks occasionally from her electronics, positive self talk, finding the strengths and positives within herself and be able to name them, stop assuming worst case scenarios, getting outside daily, spending time with her 2 dogs who are very therapeutic for her, and realize the strength she shows when working with goal directed behaviors to move in a direction that supports her improved coping, improved emotional health, and overall wellbeing.  Goal review and progress/challenges noted with patient.  Next appointment within 2 to 3 weeks.  This record has been created using [employer].  Chart creation errors have been sought, but may not always have been located and corrected.  Such creation errors do not reflect on the standard of medical care provided.   Shanon Ace, LCSW

## 2022-04-22 ENCOUNTER — Encounter: Payer: Self-pay | Admitting: Psychiatry

## 2022-04-23 ENCOUNTER — Other Ambulatory Visit: Payer: Self-pay | Admitting: Psychiatry

## 2022-04-23 DIAGNOSIS — F314 Bipolar disorder, current episode depressed, severe, without psychotic features: Secondary | ICD-10-CM

## 2022-04-25 ENCOUNTER — Telehealth: Payer: Self-pay | Admitting: Psychiatry

## 2022-04-25 NOTE — Telephone Encounter (Signed)
Rtc to pt and she sounded good on the phone when answering, I asked how she was and said good. Informed her I received a phone that she was having SI. She reports she was on Sunday, she never had a plan. She has a lot of situational things going on at home with her husband and it was a bad weekend. She reports taking Clozapine 5 mg currently and she feels like her depression is not good and she is non-functional.  Informed her Dr. Clovis Pu was out of the office but I would update him, she does not want him notified for this because it hasn't changed much. Informed her I would follow up with her next week unless he decided to do something different. She was very appreciative of me calling to check on her. She does contract for safety.

## 2022-04-25 NOTE — Telephone Encounter (Signed)
Raquel Sarna nurse for check in with Ingrezza left message reporting pt has been having suicidal ideations. No plans. Safe today. Contact Emily @ [phone] for any questions.

## 2022-04-26 DIAGNOSIS — F319 Bipolar disorder, unspecified: Secondary | ICD-10-CM | POA: Diagnosis not present

## 2022-04-26 DIAGNOSIS — Z79899 Other long term (current) drug therapy: Secondary | ICD-10-CM | POA: Diagnosis not present

## 2022-04-28 ENCOUNTER — Encounter: Payer: Self-pay | Admitting: Psychiatry

## 2022-05-02 ENCOUNTER — Other Ambulatory Visit: Payer: Self-pay | Admitting: Psychiatry

## 2022-05-02 DIAGNOSIS — F319 Bipolar disorder, unspecified: Secondary | ICD-10-CM

## 2022-05-02 DIAGNOSIS — F411 Generalized anxiety disorder: Secondary | ICD-10-CM

## 2022-05-03 DIAGNOSIS — Z79899 Other long term (current) drug therapy: Secondary | ICD-10-CM | POA: Diagnosis not present

## 2022-05-03 DIAGNOSIS — F319 Bipolar disorder, unspecified: Secondary | ICD-10-CM | POA: Diagnosis not present

## 2022-05-04 ENCOUNTER — Encounter: Payer: Self-pay | Admitting: Psychiatry

## 2022-05-04 NOTE — Progress Notes (Signed)
Labs put in REMS

## 2022-05-06 ENCOUNTER — Encounter: Payer: Self-pay | Admitting: Internal Medicine

## 2022-05-08 ENCOUNTER — Ambulatory Visit
Admission: RE | Admit: 2022-05-08 | Discharge: 2022-05-08 | Disposition: A | Discharge status: Home / Self Care | Payer: No Typology Code available for payment source | Source: Ambulatory Visit | Attending: Nurse Practitioner | Admitting: Nurse Practitioner

## 2022-05-08 ENCOUNTER — Ambulatory Visit (INDEPENDENT_AMBULATORY_CARE_PROVIDER_SITE_OTHER): Payer: No Typology Code available for payment source

## 2022-05-08 VITALS — BP 128/85 | HR 102 | Temp 98.8°F | Resp 18

## 2022-05-08 DIAGNOSIS — M25562 Pain in left knee: Secondary | ICD-10-CM | POA: Diagnosis not present

## 2022-05-08 DIAGNOSIS — S8392XA Sprain of unspecified site of left knee, initial encounter: Secondary | ICD-10-CM

## 2022-05-08 DIAGNOSIS — S8982XA Other specified injuries of left lower leg, initial encounter: Secondary | ICD-10-CM | POA: Diagnosis not present

## 2022-05-08 NOTE — ED Triage Notes (Signed)
Pt reports she twisted her knees last night and she hear a pop in the left knee after she was trying to stand up from a tall stool bar chair. Pain is worse when walking.

## 2022-05-08 NOTE — Discharge Instructions (Addendum)
Your x-rays are negative for fracture or dislocation.   May take over-the-counter ibuprofen or Tylenol for pain or discomfort. RICE therapy rest, ice, compression, and elevation until your symptoms improve.  Apply ice for 20 minutes, remove for 1 hour, then repeat as often as possible.  This will help with pain and swelling. Weightbearing as tolerated. Wear the knee brace with strenuous or prolonged activity.  Follow-up with Dr. Rush Farmer if your symptoms fail to improve. Follow-up in this clinic as needed.

## 2022-05-08 NOTE — ED Provider Notes (Signed)
[facility]    CSN: [id number] Arrival date & time: 05/08/22  1039      History   Chief Complaint Chief Complaint  Patient presents with   Appointment    1100 Entered by patient   Knee Injury    HPI Megan Whitney is a 66 y.o. female.   The history is provided by the patient.   Patient presents for complaints of left knee pain that occurred after she was stepping down off of a tall barstool 1 day ago.  She states that she was stepping down and twisted the left knee, states she heard a "pop" in the left knee.  She states subsequently thereafter, she began having pain in the back of the left knee.  She states that she has also been experiencing numbness and tingling of the left lower extremity.  Patient states going up and down stairs is something that she has been unable to do since her symptoms started.  She also states that walking makes her pain worse.  She denies any obvious swelling or ankle pain.  She states that she has been using a walker as she states the knee feels "unsteady or like it is going to give out".  She has been icing and elevating the knee and taking Tylenol for her symptoms.  Past Medical History:  Diagnosis Date   ADD (attention deficit disorder)    Allergy    Seasonal   Anemia    History of GI blood loss   Anxiety    Arthritis    Atrophy of vagina 10/07/2020   Bipolar 1 disorder (HCC)    Cancer (HCC)    Colon polyps    Depression    Diabetes mellitus ([place])    Edema, lower extremity    Epistaxis    Around 2011 or 2012, required cauterization.    Esophageal stricture    Fracture of superior pubic ramus (HCC) 11/28/2018   GERD (gastroesophageal reflux disease)    Headache(784.0)    Hyperlipidemia    Interstitial cystitis    Joint pain    Lactose intolerance    Lung cancer ([place]) 2002   Neuromuscular disorder ([place])    Obesity    Osteoarthritis    Palpitations    Sleep apnea    Doesn't use a CPAP   Suicidal ideation 01/20/2020    Swallowing difficulty    Tardive dyskinesia     Patient Active Problem List   Diagnosis Date Noted   Encounter for general adult medical examination with abnormal findings 03/22/2022   Arthritis of knee 08/17/2021   Polyp of colon 02/04/2021   Dyskinesia 11/22/2020   Parkinsonism ([place]) 11/22/2020   Seasonal allergies 10/07/2020   Osteopenia 10/07/2020   Gait disturbance 10/07/2020   Hyperlipidemia 06/11/2020   Sleep apnea 06/11/2020   Stricture and stenosis of esophagus 05/16/2012   Hiatal hernia 05/16/2012   Dysphagia, unspecified(787.20) 05/15/2012   Depression    Bipolar disorder ([place])    GERD (gastroesophageal reflux disease) 09/14/2010   Personal history of colonic polyps 09/14/2010    Past Surgical History:  Procedure Laterality Date   BALLOON DILATION  05/16/2012   Procedure: BALLOON DILATION;  Surgeon: Inda Castle, MD;  Location: [place];  Service: Endoscopy;  Laterality: N/A;   BUNIONECTOMY  2011   COLONOSCOPY     ENTEROSCOPY  05/16/2012   Procedure: ENTEROSCOPY;  Surgeon: Inda Castle, MD;  Location: [place];  Service: Endoscopy;  Laterality: N/A;   JOINT REPLACEMENT  right shoulder durgery 25 yrs ago  [place]  2006, 2008   bilateral   [place] RESECTION  2002   lung cancer    OB History     Gravida  1   Para      Term      Preterm      AB      Living         SAB      IAB      Ectopic      Multiple      Live Births               Home Medications    Prior to Admission medications   Medication Sig Start Date End Date Taking? Authorizing Provider  acetaminophen (TYLENOL) 650 MG CR tablet Take 1,300 mg by mouth as needed for pain.    [provider]  atorvastatin (LIPITOR) 20 MG tablet TAKE (1) TABLET BY MOUTH AT BEDTIME. 04/11/22   Lindell Spar, MD  chlordiazePOXIDE (LIBRIUM) 5 MG capsule Take 1 capsule (5 mg total) by mouth at bedtime. 05/02/22   Cottle, Billey Co., MD  clozapine (CLOZARIL) 50 MG tablet Take 1.5 tablets (75 mg total) by mouth at bedtime. 04/05/22   Cottle, Billey Co., MD  dexmethylphenidate (FOCALIN) 10 MG tablet TAKE 1 AND 1/2 TABLETS BY MOUTH TWICE DAILY. 04/23/22   Cottle, Billey Co., MD  lamoTRIgine (LAMICTAL) 200 MG tablet TAKE (1) TABLET BY MOUTH TWICE DAILY. 03/11/22   Cottle, Billey Co., MD  lithium carbonate 150 MG capsule TAKE (1) CAPSULE BY MOUTH AT BEDTIME. 05/02/22   Cottle, Billey Co., MD  Melatonin 10 MG CAPS Take by mouth at bedtime as needed.    [provider]  pantoprazole (PROTONIX) 40 MG tablet Take 1 tablet (40 mg total) by mouth 2 (two) times daily. 02/02/22   Irene Shipper, MD  valbenazine [facility]) 40 MG capsule Take 1 capsule (40 mg total) by mouth daily. 02/15/22   Cottle, Billey Co., MD    Family History Family History  Problem Relation Age of Onset   Arthritis Mother    Hearing loss Mother    Hyperlipidemia Mother    Hypertension Mother    Depression Mother    Anxiety disorder Mother    Obesity Mother    Sudden death Mother    Hypertension Father    Diabetes Mellitus II Father    Heart disease Father    Arthritis Father    Cancer Father        Brain   COPD Father    Diabetes Father    Hyperlipidemia Father    Sleep apnea Father    Early death Sister        Aneroxia/Bulimic   Depression Brother    Early death Proofreader Accident   Depression Daughter    Drug abuse Daughter    Heart disease Daughter    Hypertension Daughter    Stroke Maternal Grandmother    Hypertension Maternal Grandmother    Arthritis Maternal Grandfather    Heart attack Maternal Grandfather    Hearing loss Maternal Grandfather    Colon cancer Neg Hx    Esophageal cancer Neg Hx    Rectal cancer Neg Hx     Social History Social History   Tobacco Use   Smoking status: Never  Smokeless tobacco: Never  Vaping Use   Vaping Use: Never used  Substance Use Topics   Alcohol use: Yes     Alcohol/week: 1.0 standard drink of alcohol    Types: 1 Glasses of wine per week    Comment: Moderate   Drug use: No     Allergies   Azithromycin, Penicillins, and Adhesive [tape]   Review of Systems Review of Systems Per HPI  Physical Exam Triage Vital Signs ED Triage Vitals  Enc Vitals Group     BP 05/08/22 1103 128/85     Pulse Rate 05/08/22 1103 (!) 102     Resp 05/08/22 1103 18     Temp 05/08/22 1103 98.8 F (37.1 C)     Temp Source 05/08/22 1103 Oral     SpO2 05/08/22 1103 91 %     Weight --      Height --      Head Circumference --      Peak Flow --      Pain Score 05/08/22 1104 2     Pain Loc --      Pain Edu? --      Excl. in [place]? --    No data found.  Updated Vital Signs BP 128/85 (BP Location: Right Arm)   Pulse (!) 102   Temp 98.8 F (37.1 C) (Oral)   Resp 18   SpO2 91%   Visual Acuity Right Eye Distance:   Left Eye Distance:   Bilateral Distance:    Right Eye Near:   Left Eye Near:    Bilateral Near:     Physical Exam Vitals and nursing note reviewed.  Constitutional:      General: She is not in acute distress.    Appearance: Normal appearance.  HENT:     Head: Normocephalic.  Eyes:     Extraocular Movements: Extraocular movements intact.     Conjunctiva/sclera: Conjunctivae normal.     Pupils: Pupils are equal, round, and reactive to light.  Cardiovascular:     Rate and Rhythm: Normal rate and regular rhythm.     Pulses: Normal pulses.     Heart sounds: Normal heart sounds.  Pulmonary:     Effort: Pulmonary effort is normal.     Breath sounds: Normal breath sounds.  Abdominal:     General: Bowel sounds are normal.     Palpations: Abdomen is soft.  Musculoskeletal:     Cervical back: Normal range of motion.     Left knee: No swelling or deformity. Decreased range of motion. Tenderness present over the PCL. Normal pulse.     Instability Tests: Anterior drawer test negative.  Lymphadenopathy:     Cervical: No cervical  adenopathy.  Skin:    General: Skin is warm and dry.  Neurological:     Mental Status: She is alert.      UC Treatments / Results  Labs (all labs ordered are listed, but only abnormal results are displayed) Labs Reviewed - No data to display  EKG   Radiology DG Knee Complete 4 Views Left  Result Date: 05/08/2022 CLINICAL DATA:  Twisting injury to the left knee last night. Posterior knee pain. EXAM: LEFT KNEE - COMPLETE 4+ VIEW COMPARISON:  09/11/2021. FINDINGS: No evidence of fracture, dislocation, or joint effusion. No evidence of arthropathy or other focal bone abnormality. Soft tissues are unremarkable. IMPRESSION: Negative. Electronically Signed   By: Lajean Manes M.D.   On: 05/08/2022 11:29    Procedures Procedures (including  critical care time)  Medications Ordered in UC Medications - No data to display  Initial Impression / Assessment and Plan / UC Course  I have reviewed the triage vital signs and the nursing notes.  Pertinent labs & imaging results that were available during my care of the patient were reviewed by me and considered in my medical decision making (see chart for details).  Patient presents for complaints of left knee pain after stepping off of a stool and twisting the left knee.  Patient states she felt a "pop" when the injury occurred.  On exam, patient has no obvious deformity or ecchymosis present.  There is no swelling.  She does have tenderness to the posterior area of the left knee.  X-rays were negative for fracture or dislocation.  Patient was concerned because she felt a "pop" that she may need further evaluation.  Recommend that she follow-up with Dr. Ninfa Linden who she has seen in the past.  In the interim, patient was given a hinged knee brace to help with compression and support.  Patient advised to continue use of Tylenol and RICE therapy while her symptoms persist.  Patient verbalizes understanding.  All questions were answered. Final Clinical  Impressions(s) / UC Diagnoses   Final diagnoses:  Sprain of left knee, unspecified ligament, initial encounter     Discharge Instructions      Your x-rays are negative for fracture or dislocation.   May take over-the-counter ibuprofen or Tylenol for pain or discomfort. RICE therapy rest, ice, compression, and elevation until your symptoms improve.  Apply ice for 20 minutes, remove for 1 hour, then repeat as often as possible.  This will help with pain and swelling. Weightbearing as tolerated. Wear the knee brace with strenuous or prolonged activity.  Follow-up with Dr. Rush Farmer if your symptoms fail to improve. Follow-up in this clinic as needed.      ED Prescriptions   None    PDMP not reviewed this encounter.   Tish Men, NP 05/08/22 1212

## 2022-05-10 ENCOUNTER — Telehealth (INDEPENDENT_AMBULATORY_CARE_PROVIDER_SITE_OTHER): Payer: No Typology Code available for payment source | Admitting: Psychiatry

## 2022-05-10 ENCOUNTER — Encounter: Payer: Self-pay | Admitting: Psychiatry

## 2022-05-10 DIAGNOSIS — G2401 Drug induced subacute dyskinesia: Secondary | ICD-10-CM

## 2022-05-10 DIAGNOSIS — F319 Bipolar disorder, unspecified: Secondary | ICD-10-CM | POA: Diagnosis not present

## 2022-05-10 DIAGNOSIS — Z79899 Other long term (current) drug therapy: Secondary | ICD-10-CM | POA: Diagnosis not present

## 2022-05-10 DIAGNOSIS — R251 Tremor, unspecified: Secondary | ICD-10-CM | POA: Diagnosis not present

## 2022-05-10 DIAGNOSIS — G3184 Mild cognitive impairment, so stated: Secondary | ICD-10-CM | POA: Diagnosis not present

## 2022-05-10 DIAGNOSIS — F900 Attention-deficit hyperactivity disorder, predominantly inattentive type: Secondary | ICD-10-CM | POA: Diagnosis not present

## 2022-05-10 DIAGNOSIS — F5105 Insomnia due to other mental disorder: Secondary | ICD-10-CM

## 2022-05-10 DIAGNOSIS — F411 Generalized anxiety disorder: Secondary | ICD-10-CM | POA: Diagnosis not present

## 2022-05-10 MED ORDER — CLOZAPINE 100 MG PO TABS: 100.0000 mg | ORAL_TABLET | Freq: Every day | ORAL | 3 refills | Status: DC

## 2022-05-10 MED ORDER — VALBENAZINE TOSYLATE 60 MG PO CAPS: 60.0000 mg | ORAL_CAPSULE | Freq: Every day | ORAL | 1 refills | Status: DC

## 2022-05-10 NOTE — Progress Notes (Signed)
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 mg once last night. Experiencing more depression.  Anxiety out the roof.  Anger.  Sleep is good and better with clozapine.9-10 hours. Rough 3 weeks.  Mixed sx with depression the main one. SE drooling. Mouth movements, she doesn't want to add another med right now. Tremor a lot better off Vraylar, almost none.   Saw neuro and pending sleep study. Plan: Clonidine off label for irritability and anxiety 0.05 mg BID Increase clonidine to 1/2 tablet twice a day for a week.    12/16/2021 phone call from patient's husband concerned that she is grinding her teeth and slurring her words.  She had started clozapine taking 25 mg tablets 1-1/2 nightly and she was instructed to reduce the dose to 25 mg nightly  01/04/2022 appointment with the following noted: Off Vraylar and on clozapine 25 mg HS.  Continues Equetro 200, CBZ 100, Lamotrigine 200 BID, lithium 150 HS, clonidine 0.1 mg 1/2 in AM and 1 at night, Librium 10 HS. SE a alittle dizzy. SE: Still having mouth movements and biting tongue.  Sometimes hard to  talk.  Drooling.  When tries to increase clozapine slurred speech and severe dizziness.   Mood is a little better.   Sleeping 8-9 hours.  So much better sleep with clozapine.   Still has anxiety, generalized. Plan: To minimize polypharmacy and improve tolerabilty:  Reduce Equetro to 1 of the 100 mg capsule at night for 1 week, then stop it. Wait 1 week then stop the carbamazepine chewable. Wait 1 more week then reduce clonidine to 1/2 at night for 1 week then stop it. Plan: Started clozapine  and continue 25 mg for now bc hasn't tolerated more so far.  02/08/22 appt noted: Mouth movements and biting tongue.  Sores on cheek with constant chewing movements. Sometimes hard to talk. Hypersalivation gets worse as day progresses. Sometimes balance problems.  Constipation managed.   Sleep very well.  8-9 hours. Off Equetro and on clozapine. Still has depression and anxiety without much change Plan: Started clozapine  and continue 25 mg for now bc hasn't tolerated more and need to start Ingrezza 40 mg for TD.  03/23/2022 appointment with the following noted: Several phone calls since here.  Has gotten up to clozapine 37.5 mg nightly. Continues Focalin 15 mg twice  daily, lamotrigine 200 mg twice daily, lithium 150 nightly. Started Ingrezza 40 mg daily. Ingrezza amazing difference but even with grant of $10000 can't afford it.  Not biting mouth and mouth less sore.  Less mouth movements but not gone Emotionally not real well with anxiety and depression and crying spells.  Also some irritability and anger.  Easily triggered anger. Sleep more broken with Ingrezza HS but 8-9 hours. Can be sedated if gets up early with slurred speech but not if full night sleep. Balance better off Equetro. Plan: Started clozapine but needs to increase bc minimal effect and better tolerance, so increase to 50 mg HS  04/05/22 appt noted: Increased clozapine to 50 mg HS.  Some groggy in the AM.  One day was dizzy.   Otherwise on occasion.  Takes it right before bed.   Still depression, hopeless, irritability and anger.  Some periods of racing thoughts.  Sometimes recognizes hypomanic episodes and somethimes doesn't recognize. Dog is very sick and H with bone CA.  Not crying on clozapine as much. GERD and needs surgery for hiatal hernia. Signed up for water aerobics. Plan: Started clozapine but needs to increase bc minimal effect and better tolerance, so increase to 75 mg HS (Started clozapine on 12/07/21) Reduce librium 5 mg HS and plan to stop  05/10/22 appt med: TD partially better with Ingrezza 40.  Has  a grant.   Increased clozapine 75 mg HS, reduced Librium to 5 mg HS. Tolerated OK. Depression a little better.  Irritability still high.  Poor memory and easily confused. Sleep is pretty good and is better and needs to sleep longer.     Past Psychiatric Medication Trials: Vraylar 4.5 SE mouth movements reduced to 3 mg 3/20. It was effective at lower doses for depression.  Worse off it.  Vraylar 1.5 mg every third day led to relapse of significant depression. [place] SE at 42 mg .  Cost problems Latuda 80, , olanzapine, Seroquel, risperidone, Abilify, loxapine 25 mg BID (max tolerated) NR, Clozapine 50  lithium 150,   Trileptal 450, Depakote, Equetro 300 hx balance issues, CBZ ER falling,   Lamictal 200 twice daily,  Focalin,  Ritalin,  fluoxetine 60,  sertraline 100, Wellbutrin history of facial tics, paroxetine cognitive side effects, Lexapro 10 worse buspirone,  ropinirole, amantadine, Sinemet, Artane, Cogentin,  pramipexole 0.5 mg BID helped tremor but caused anger trazodone hangover, Ambien hangover,  Review of Systems:  Review of Systems  HENT:  Positive for dental problem and tinnitus.        Chirping cricket sounds in hears since January  Cardiovascular:  Negative for palpitations.  Gastrointestinal:  Positive for abdominal pain. Negative for nausea.       GERD awakening her   Musculoskeletal:  Positive for arthralgias and gait problem.  Neurological:  Positive for dizziness. Negative for tremors and seizures.       Ankle problems and balance problems. No falls lately. Occ stumbles. Tremor is better in hands Mouth movements  Psychiatric/Behavioral:  Positive for decreased concentration and dysphoric mood. Negative for agitation, behavioral problems, confusion, hallucinations, self-injury, sleep disturbance and suicidal ideas. The patient is nervous/anxious. The patient is not hyperactive.   No falls since here. Not currently depressed but unable to remove this from the list.  Medications: I have reviewed the patient's current medications.  Current Outpatient Medications  Medication Sig Dispense Refill   acetaminophen (TYLENOL) 650 MG CR tablet Take 1,300 mg by mouth as needed for pain.  atorvastatin (LIPITOR) 20 MG tablet TAKE (1) TABLET BY MOUTH AT BEDTIME. 90 tablet 0   dexmethylphenidate (FOCALIN) 10 MG tablet TAKE 1 AND 1/2 TABLETS BY MOUTH TWICE DAILY. 90 tablet 0   lamoTRIgine (LAMICTAL) 200 MG tablet TAKE (1) TABLET BY MOUTH TWICE DAILY. 180 tablet 0   lithium carbonate 150 MG capsule TAKE (1) CAPSULE BY MOUTH AT BEDTIME. 90 capsule 0   Melatonin 10 MG CAPS Take by mouth at bedtime as needed.     pantoprazole (PROTONIX) 40 MG tablet Take 1 tablet (40 mg total) by mouth 2 (two) times daily. 180 tablet 3   valbenazine (INGREZZA) 40 MG capsule Take 1 capsule (40 mg total) by mouth daily. 30 capsule 5   clozapine (CLOZARIL) 100 MG tablet Take 1 tablet (100 mg total) by mouth at bedtime. 7 tablet 3   No current facility-administered medications for this visit.    Medication Side Effects: Other: tremor and weight gain.   Dyskinesia appears better  SE bettter than they were.  Balance problems intermittently  Allergies:  Allergies  Allergen Reactions   Azithromycin Anaphylaxis   Penicillins Anaphylaxis    DID THE REACTION INVOLVE: Swelling of the  face/tongue/throat, SOB, or low BP? Yes Sudden or severe rash/hives, skin peeling, or the inside of the mouth or nose? Yes Did it require medical treatment? No When did it last happen?       If all above answers are "NO", may proceed with cephalosporin use.  Patient reacts to Z pack.  HAS Taken amoxicillin fine.   Adhesive [Tape] Other (See Comments)    On bandaids    Past Medical History:  Diagnosis Date   ADD (attention deficit disorder)    Allergy    Seasonal   Anemia    History of GI blood loss   Anxiety    Arthritis    Atrophy of vagina 10/07/2020   Bipolar 1 disorder (HCC)    Cancer (HCC)    Colon polyps    Depression    Diabetes mellitus ([place])    Edema, lower extremity    Epistaxis    Around 2011 or 2012, required cauterization.    Esophageal stricture    Fracture of superior pubic ramus (HCC) 11/28/2018   GERD (gastroesophageal reflux disease)    Headache(784.0)    Hyperlipidemia    Interstitial cystitis    Joint pain    Lactose intolerance    Lung cancer ([place]) 2002   Neuromuscular disorder ([place])    Obesity    Osteoarthritis    Palpitations    Sleep apnea    Doesn't use a CPAP   Suicidal ideation 01/20/2020   Swallowing difficulty    Tardive dyskinesia     Family History  Problem Relation Age of Onset   Arthritis Mother    Hearing loss Mother    Hyperlipidemia Mother    Hypertension Mother    Depression Mother    Anxiety disorder Mother    Obesity Mother    Sudden death Mother    Hypertension Father    Diabetes Mellitus II Father    Heart disease Father    Arthritis Father    Cancer Father        Brain   COPD Father    Diabetes Father    Hyperlipidemia Father    Sleep apnea Father    Early death Sister        Aneroxia/Bulimic   Depression Brother    Early death Brother  Car Accident   Depression Daughter    Drug abuse Daughter    Heart disease Daughter    Hypertension Daughter    Stroke Maternal Grandmother    Hypertension  Maternal Grandmother    Arthritis Maternal Grandfather    Heart attack Maternal Grandfather    Hearing loss Maternal Grandfather    Colon cancer Neg Hx    Esophageal cancer Neg Hx    Rectal cancer Neg Hx     Social History   Socioeconomic History   Marital status: Married    Spouse name: Not on file   Number of children: 1   Years of education: Not on file   Highest education level: Not on file  Occupational History   Occupation: Admin. assistant  Tobacco Use   Smoking status: Never   Smokeless tobacco: Never  Vaping Use   Vaping Use: Never used  Substance and Sexual Activity   Alcohol use: Yes    Alcohol/week: 1.0 standard drink of alcohol    Types: 1 Glasses of wine per week    Comment: Moderate   Drug use: No   Sexual activity: Yes  Other Topics Concern   Not on file  Social History Narrative   Pt lives in [place] with husband Lanny Hurst.  Followed by Dr. Clovis Pu for psychiatry and Rinaldo Cloud for therapy.   Social Determinants of Health   Financial Resource Strain: Medium Risk (05/09/2021)   Overall Financial Resource Strain (CARDIA)    Difficulty of Paying Living Expenses: Somewhat hard  Food Insecurity: No Food Insecurity (05/09/2021)   Hunger Vital Sign    Worried About Running Out of Food in the Last Year: Never true    Ran Out of Food in the Last Year: Never true  Transportation Needs: No Transportation Needs (05/09/2021)   PRAPARE - Hydrologist (Medical): No    Lack of Transportation (Non-Medical): No  Physical Activity: Inactive (05/09/2021)   Exercise Vital Sign    Days of Exercise per Week: 0 days    Minutes of Exercise per Session: 0 min  Stress: No Stress Concern Present (05/09/2021)   Industry    Feeling of Stress : Only a little  Social Connections: Moderately Integrated (05/09/2021)   Social Connection and Isolation Panel [NHANES]    Frequency of Communication  with Friends and Family: More than three times a week    Frequency of Social Gatherings with Friends and Family: More than three times a week    Attends Religious Services: More than 4 times per year    Active Member of [employer] or Organizations: No    Attends Archivist Meetings: Never    Marital Status: Married  Human resources officer Violence: Not At Risk (05/09/2021)   Humiliation, Afraid, Rape, and Kick questionnaire    Fear of Current or Ex-Partner: No    Emotionally Abused: No    Physically Abused: No    Sexually Abused: No    Past Medical History, Surgical history, Social history, and Family history were reviewed and updated as appropriate.   Please see review of systems for further details on the patient's review from today.   Objective:   Physical Exam:  There were no vitals taken for this visit.  Physical Exam Constitutional:      General: She is not in acute distress. Musculoskeletal:        General: No deformity.  Neurological:     Mental  Status: She is alert and oriented to person, place, and time.     Cranial Nerves: No dysarthria.     Motor: Tremor present.     Coordination: Coordination normal.     Comments: Mild resting R hand rotational tremor better but not gone. Fidgety mildy with feet better Some buccolingual tremor is better  Psychiatric:        Attention and Perception: Attention and perception normal. She does not perceive auditory hallucinations.        Mood and Affect: Mood is anxious and depressed. Affect is not labile, blunt, angry or inappropriate.        Speech: Speech normal. Speech is not rapid and pressured.        Behavior: Behavior normal. Behavior is not agitated. Behavior is cooperative.        Thought Content: Thought content normal. Thought content is not paranoid or delusional. Thought content does not include homicidal or suicidal ideation. Thought content does not include suicidal plan.        Cognition and Memory: Cognition and  memory normal.        Judgment: Judgment normal.     Comments: Insight intact more mood cycling and still anxious. Still anxious Overall mood needs more improvement but depression better with clozapine       Lab Review:     Component Value Date/Time   NA 141 03/18/2022 1021   K 4.2 03/18/2022 1021   CL 104 03/18/2022 1021   CO2 22 03/18/2022 1021   GLUCOSE 100 (H) 03/18/2022 1021   GLUCOSE 102 (H) 01/19/2020 1158   BUN 10 03/18/2022 1021   CREATININE 0.87 03/18/2022 1021   CALCIUM 9.3 03/18/2022 1021   PROT 6.8 03/18/2022 1021   ALBUMIN 4.3 03/18/2022 1021   AST 17 03/18/2022 1021   ALT 10 03/18/2022 1021   ALKPHOS 83 03/18/2022 1021   BILITOT 0.5 03/18/2022 1021   GFRNONAA 83 03/02/2020 1433   GFRAA 96 03/02/2020 1433       Component Value Date/Time   WBC 6.3 03/18/2022 1021   WBC 7.4 01/19/2020 1158   RBC 4.50 03/18/2022 1021   RBC 4.19 01/19/2020 1158   HGB 13.0 03/18/2022 1021   HCT 40.8 03/18/2022 1021   PLT 261 03/18/2022 1021   MCV 91 03/18/2022 1021   MCH 28.9 03/18/2022 1021   MCH 30.3 01/19/2020 1158   MCHC 31.9 03/18/2022 1021   MCHC 32.1 01/19/2020 1158   RDW 12.8 03/18/2022 1021   LYMPHSABS 2.1 03/18/2022 1021   MONOABS 0.7 04/04/2019 1004   EOSABS 0.4 03/18/2022 1021   BASOSABS 0.0 03/18/2022 1021  Vitamin D level 33 on 10K units daily on 12/4/`9 Increased to prescription vitamin d 50K units Monday, Wed, Friday.  Rx sent in.   Lithium Lvl  Date Value Ref Range Status  10/21/2018 0.18 (L) 0.60 - 1.20 mmol/L Final    Comment:    Performed at [facility], [address], [place], [place] [address]     No results found for: "PHENYTOIN", "PHENOBARB", "VALPROATE", "CBMZ"   .res Assessment: Plan:    Bipolar I disorder with rapid cycling ([place]) - Plan: clozapine (CLOZARIL) 100 MG tablet, B12 and Folate Panel  Generalized anxiety disorder  Tardive dyskinesia  Insomnia due to mental condition  Attention deficit hyperactivity disorder  (ADHD), predominantly inattentive type  Mild cognitive impairment - Plan: B12 and Folate Panel  Long term current use of clozapine  Tremor of both hands  Phil has chronic  rapid cycling bipolar disorder which is chronically unstable and has been difficult to control.  The rapid cycling is making it difficult to control frequency of depressive episodes and the anxiety as well.  We have typically had to make frequent med changes.   Disc gradual increase bc med sensitivity  Started clozapine but needs to increase bc minimal effect and better tolerance, so increase to 100 mg HS (Started clozapine on 12/07/21)  Ingrezza 40 mg for TD helped  Disc ECT. Only FDA approved option left. However she decided to pursue clozapine and is up to 25 mg for 1 night. Extensive discussion of clozapine dosing recommendations but we will increase more slowly bc she is so med sensitive.Disc risk low WBC, cardiomyopathy, etc, sedation Disc weekly labs etc and REMS program.    Prone to UTI and may be causing confusion discussed.  Had confirmed UTI recently.  Out of work ADD less of a problem.  Discussed the risk of stimulants including that that could contribute to mood instability and mood swings.    She has a high residual anxiety.  It has been impossible to control all of her symptoms simultaneously without causing side effects. Failed various meds.  She agrees.  Continue the following Discussed side effects of each medicine. Continue   Focalin XR 20 mg  To IR 15 mg BID DT Cost and off label for depression  Try to spread this out if possible for mood.  She feels this is necessary  Lamotrigine 200 mg twice daily, consider reduction if clozapine helps. Lithium 150 mg nightly  Disc risk worsening TD and tremor. Failed all reasonable alternatives for anxiety.  Option viibryd DC Librium   Discussed potential metabolic side effects associated with atypical antipsychotics, as well as potential risk for  movement side effects. Advised pt to contact office if movement side effects occur.  She may be having some mild TD with toe movement and grimacing.  Disc this in detail.  At this point it's tolerable.  . Suspect it.  "I don't realize it". But sometimes others notice  Worsening TD partial response on 40 mg Ingrezza, increase to 60 mg daily    Check B12 folate bc memory complaints.  Disc SE meds and this is heightened by the complication of necessary polypharmacy.  Supportive therapy in terms of dealing with husband's addiction and now new dx metastatic prostate CA.Marland Kitchen Disc poss PT job to help with socialization. Sleep hygiene disc and not layingin bed awake for long periods.  Requires frequent FU DT chronic instability.  Wants to schedule monthly.  FU 4 weeks. Call if not better in 4 weeks and hope to increase clozapine  Lynder Parents MD, DFAPA  Please see After Visit Summary for patient specific instructions.    Future Appointments  Date Time Provider [place]  05/11/2022  3:00 PM [facility] NURSE [facility] RPC  05/12/2022 10:00 AM Shanon Ace, LCSW CP-CP None  06/02/2022 10:00 AM Shanon Ace, LCSW CP-CP None  06/23/2022 10:00 AM Shanon Ace, LCSW CP-CP None  07/07/2022 10:00 AM Cottle, Billey Co., MD CP-CP None  07/14/2022 10:00 AM Shanon Ace, LCSW CP-CP None  09/22/2022  1:40 PM Lindell Spar, MD [facility] RPC    Orders Placed This Encounter  Procedures   B12 and Folate Panel       -------------------------------

## 2022-05-11 ENCOUNTER — Encounter: Payer: Self-pay | Admitting: Psychiatry

## 2022-05-11 ENCOUNTER — Encounter: Payer: Self-pay | Admitting: Internal Medicine

## 2022-05-11 ENCOUNTER — Ambulatory Visit (INDEPENDENT_AMBULATORY_CARE_PROVIDER_SITE_OTHER): Payer: No Typology Code available for payment source

## 2022-05-11 DIAGNOSIS — Z1231 Encounter for screening mammogram for malignant neoplasm of breast: Secondary | ICD-10-CM

## 2022-05-11 DIAGNOSIS — Z1159 Encounter for screening for other viral diseases: Secondary | ICD-10-CM

## 2022-05-11 DIAGNOSIS — Z780 Asymptomatic menopausal state: Secondary | ICD-10-CM

## 2022-05-11 DIAGNOSIS — Z0000 Encounter for general adult medical examination without abnormal findings: Secondary | ICD-10-CM | POA: Diagnosis not present

## 2022-05-11 NOTE — Patient Instructions (Addendum)
  Megan Whitney , Thank you for taking time to come for your Medicare Wellness Visit. I appreciate your ongoing commitment to your health goals. Please review the following plan we discussed and let me know if I can assist you in the future.   Vaccines needed- flu and pneumonia vaccine  Mammogram and bone density ordered. They are due after 06/28/22. You can schedule online or call [phone] and they both can usually be scheduled on the same day.   Hepatitis C lab test ordered. Can do this when you next have labs drawn at [place].    These are the goals we discussed:  Goals      Increase physical activity     Try to go for short walks as able 3-4 times per week for 15-20 mins     Prevent falls        This is a list of the screening recommended for you and due dates:  Health Maintenance  Topic Date Due   Hepatitis C Screening: USPSTF Recommendation to screen - Ages 63-79 yo.  Never done   Pneumonia Vaccine (2 - PPSV23 or PCV20) 09/22/2020   COVID-19 Vaccine (4 - Moderna risk series) 03/11/2021   Flu Shot  04/05/2022   Mammogram  06/29/2023   Colon Cancer Screening  09/18/2023   Tetanus Vaccine  09/05/2028   DEXA scan (bone density measurement)  Completed   Zoster (Shingles) Vaccine  Completed   HPV Vaccine  Aged Out

## 2022-05-11 NOTE — Progress Notes (Signed)
I connected with  Megan Whitney on 05/11/22 by a audio enabled telemedicine application and verified that I am speaking with the correct person using two identifiers.  Patient Location: Home  Provider Location: Home Office  I discussed the limitations of evaluation and management by telemedicine. The patient expressed understanding and agreed to proceed.  Subjective:   Megan Whitney is a 66 y.o. female who presents for Medicare Annual (Subsequent) preventive examination.  Review of Systems     Cardiac Risk Factors include: sedentary lifestyle;dyslipidemia;advanced age (>52men, >50 women)     Objective:    There were no vitals filed for this visit. There is no height or weight on file to calculate BMI.     05/11/2022    2:41 PM 05/09/2021    8:37 AM 01/19/2020    1:32 PM 01/19/2020   11:59 AM 10/29/2019   11:10 AM 10/21/2018    8:28 AM 09/08/2018   12:52 PM  Advanced Directives  Does Patient Have a Medical Advance Directive? No Yes No No Yes Yes Yes  Type of Advance Directive  Living will;Healthcare Power of [place];Living will [place];Living will [place];Living will  Does patient want to make changes to medical advance directive?  No - Patient declined       Copy of [place] in Chart?  No - copy requested    No - copy requested   Would patient like information on creating a medical advance directive? Yes (ED - Information included in AVS) No - Patient declined  No - Patient declined       Current Medications (verified) Outpatient Encounter Medications as of 05/11/2022  Medication Sig   acetaminophen (TYLENOL) 650 MG CR tablet Take 1,300 mg by mouth as needed for pain.   atorvastatin (LIPITOR) 20 MG tablet TAKE (1) TABLET BY MOUTH AT BEDTIME.   clozapine (CLOZARIL) 100 MG tablet Take 1 tablet (100 mg total) by mouth at bedtime.   dexmethylphenidate (FOCALIN) 10 MG tablet TAKE 1 AND 1/2  TABLETS BY MOUTH TWICE DAILY.   lamoTRIgine (LAMICTAL) 200 MG tablet TAKE (1) TABLET BY MOUTH TWICE DAILY.   lithium carbonate 150 MG capsule TAKE (1) CAPSULE BY MOUTH AT BEDTIME.   Melatonin 10 MG CAPS Take by mouth at bedtime as needed.   pantoprazole (PROTONIX) 40 MG tablet Take 1 tablet (40 mg total) by mouth 2 (two) times daily.   valbenazine (INGREZZA) 60 MG capsule Take 1 capsule (60 mg total) by mouth daily.   No facility-administered encounter medications on file as of 05/11/2022.    Allergies (verified) Azithromycin, Penicillins, and Adhesive [tape]   History: Past Medical History:  Diagnosis Date   ADD (attention deficit disorder)    Allergy    Seasonal   Anemia    History of GI blood loss   Anxiety    Arthritis    Atrophy of vagina 10/07/2020   Bipolar 1 disorder (HCC)    Cancer (HCC)    Colon polyps    Depression    Diabetes mellitus ([place])    Edema, lower extremity    Epistaxis    Around 2011 or 2012, required cauterization.    Esophageal stricture    Fracture of superior pubic ramus (HCC) 11/28/2018   GERD (gastroesophageal reflux disease)    Headache(784.0)    Hyperlipidemia    Interstitial cystitis    Joint pain    Lactose intolerance  Lung cancer ([place]) 2002   Neuromuscular disorder ([place])    Obesity    Osteoarthritis    Palpitations    Sleep apnea    Doesn't use a CPAP   Suicidal ideation 01/20/2020   Swallowing difficulty    Tardive dyskinesia    Past Surgical History:  Procedure Laterality Date   BALLOON DILATION  05/16/2012   Procedure: BALLOON DILATION;  Surgeon: Inda Castle, MD;  Location: [facility];  Service: Endoscopy;  Laterality: N/A;   BUNIONECTOMY  2011   COLONOSCOPY     ENTEROSCOPY  05/16/2012   Procedure: ENTEROSCOPY;  Surgeon: Inda Castle, MD;  Location: [facility];  Service: Endoscopy;  Laterality: N/A;   JOINT REPLACEMENT     right shoulder durgery 30 yrs ago  [place]  2006, 2008   bilateral    TUBAL LIGATION  1990   WEDGE RESECTION  2002   lung cancer   Family History  Problem Relation Age of Onset   Arthritis Mother    Hearing loss Mother    Hyperlipidemia Mother    Hypertension Mother    Depression Mother    Anxiety disorder Mother    Obesity Mother    Sudden death Mother    Hypertension Father    Diabetes Mellitus II Father    Heart disease Father    Arthritis Father    Cancer Father        Brain   COPD Father    Diabetes Father    Hyperlipidemia Father    Sleep apnea Father    Early death Sister        Aneroxia/Bulimic   Depression Brother    Early death Proofreader Accident   Depression Daughter    Drug abuse Daughter    Heart disease Daughter    Hypertension Daughter    Stroke Maternal Grandmother    Hypertension Maternal Grandmother    Arthritis Maternal Grandfather    Heart attack Maternal Grandfather    Hearing loss Maternal Grandfather    Colon cancer Neg Hx    Esophageal cancer Neg Hx    Rectal cancer Neg Hx    Social History   Socioeconomic History   Marital status: Married    Spouse name: Not on file   Number of children: 1   Years of education: Not on file   Highest education level: Not on file  Occupational History   Occupation: Admin. assistant  Tobacco Use   Smoking status: Never   Smokeless tobacco: Never  Vaping Use   Vaping Use: Never used  Substance and Sexual Activity   Alcohol use: Yes    Alcohol/week: 1.0 standard drink of alcohol    Types: 1 Glasses of wine per week    Comment: Moderate   Drug use: No   Sexual activity: Yes  Other Topics Concern   Not on file  Social History Narrative   Pt lives in [place] with husband Lanny Hurst.  Followed by Dr. Clovis Pu for psychiatry and Rinaldo Cloud for therapy.   Social Determinants of Health   Financial Resource Strain: Low Risk  (05/11/2022)   Overall Financial Resource Strain (CARDIA)    Difficulty of Paying Living Expenses: Not hard at all  Food Insecurity: No  Food Insecurity (05/11/2022)   Hunger Vital Sign    Worried About Running Out of Food in the Last Year: Never true    Ran Out of Food in  the Last Year: Never true  Transportation Needs: No Transportation Needs (05/11/2022)   PRAPARE - Hydrologist (Medical): No    Lack of Transportation (Non-Medical): No  Physical Activity: Inactive (05/11/2022)   Exercise Vital Sign    Days of Exercise per Week: 0 days    Minutes of Exercise per Session: 0 min  Stress: No Stress Concern Present (05/09/2021)   [place]    Feeling of Stress : Only a little  Social Connections: Moderately Integrated (05/11/2022)   Social Connection and Isolation Panel [NHANES]    Frequency of Communication with Friends and Family: More than three times a week    Frequency of Social Gatherings with Friends and Family: More than three times a week    Attends Religious Services: More than 4 times per year    Active Member of [employer] or Organizations: No    Attends Music therapist: Never    Marital Status: Married    Tobacco Counseling Counseling given: Not Answered   Clinical Intake:  Pre-visit preparation completed: Yes  Pain : No/denies pain     Nutritional Status: BMI 25 -29 Overweight Diabetes: No  How often do you need to have someone help you when you read instructions, pamphlets, or other written materials from your doctor or pharmacy?: 1 - Never  Diabetic?no  Interpreter Needed?: No      Activities of Daily Living    05/11/2022    2:47 PM  In your present state of health, do you have any difficulty performing the following activities:  Hearing? 1  Vision? 0  Difficulty concentrating or making decisions? 0  Walking or climbing stairs? 1  Dressing or bathing? 0  Doing errands, shopping? 0  Preparing Food and eating ? N  Using the Toilet? N  In the past six months, have you accidently leaked  urine? N  Do you have problems with loss of bowel control? N  Managing your Medications? N  Managing your Finances? N  Housekeeping or managing your Housekeeping? N    Patient Care Team: Lindell Spar, MD as PCP - General (Internal Medicine) Irene Shipper, MD as Consulting Physician (Gastroenterology) Cottle, Billey Co., MD as Attending Physician (Psychiatry) Thurnell Lose, MD as Consulting Physician (Obstetrics and Gynecology)  Indicate any recent Medical Services you may have received from other than [facility] providers in the past year (date may be approximate).     Assessment:   This is a routine wellness examination for [place].  Hearing/Vision screen No results found.  Dietary issues and exercise activities discussed: Current Exercise Habits: The patient does not participate in regular exercise at present, Exercise limited by: orthopedic condition(s)   Goals Addressed             This Visit's Progress    Increase physical activity       Try to go for short walks as able 3-4 times per week for 15-20 mins     Prevent falls         Depression Screen    03/22/2022    1:23 PM 10/22/2021    9:41 AM 09/01/2021   11:36 AM 08/19/2021    8:51 AM 08/17/2021    8:37 AM 05/09/2021    8:38 AM 05/09/2021    8:35 AM  PHQ 2/9 Scores  PHQ - 2 Score 3 0 0 0 3 0 1  PHQ- 9 Score 18 0 0  0 17      Fall Risk    05/11/2022    2:46 PM 03/22/2022    1:23 PM 10/22/2021    9:41 AM 09/01/2021   11:35 AM 08/19/2021    8:51 AM  Fall Risk   Falls in the past year? 1 0 0 0 0  Number falls in past yr: 0 0 0 0 0  Injury with Fall? 0 0 0 0 0  Risk for fall due to :  No Fall Risks No Fall Risks No Fall Risks No Fall Risks  Follow up  Falls evaluation completed Falls evaluation completed Falls evaluation completed Falls evaluation completed    [place]:  Any stairs in or around the home? No  If so, are there any without handrails? No  Home free of loose  throw rugs in walkways, pet beds, electrical cords, etc? Yes  Adequate lighting in your home to reduce risk of falls? Yes   ASSISTIVE DEVICES UTILIZED TO PREVENT FALLS:  Life alert? No  Use of a cane, walker or w/c? Yes  Grab bars in the bathroom? Yes  Shower chair or bench in shower? Yes  Elevated toilet seat or a handicapped toilet? Yes    Cognitive Function:    05/09/2021    8:39 AM  MMSE - Mini Mental State Exam  Not completed: Unable to complete        05/09/2021    8:39 AM  6CIT Screen  What Year? 0 points  What month? 0 points  What time? 0 points  Count back from 20 0 points  Months in reverse 0 points  Repeat phrase 0 points  Total Score 0 points    Immunizations Immunization History  Administered Date(s) Administered   Fluad Quad(high Dose 65+) 08/04/2021   Influenza Split 06/19/2009, 06/01/2010, 05/17/2012, 06/04/2012, 06/30/2014, 07/15/2016   Influenza,inj,Quad PF,6+ Mos 06/24/2018, 05/01/2019, 06/14/2020   Influenza,trivalent, recombinat, inj, PF 06/03/2017   Moderna SARS-COV2 Booster Vaccination 01/14/2021   Moderna Sars-Covid-2 Vaccination 11/14/2019, 12/18/2019, 07/11/2020   Pneumococcal Conjugate-13 06/05/2018   Td 06/29/2000, 07/01/2010   Tdap 09/05/2018   Zoster Recombinat (Shingrix) 06/08/2018, 08/25/2018    TDAP status: Up to date  Flu Vaccine status: Due, Education has been provided regarding the importance of this vaccine. Advised may receive this vaccine at local pharmacy or Health Dept. Aware to provide a copy of the vaccination record if obtained from local pharmacy or Health Dept. Verbalized acceptance and understanding.  Pneumococcal vaccine status: Due, Education has been provided regarding the importance of this vaccine. Advised may receive this vaccine at local pharmacy or Health Dept. Aware to provide a copy of the vaccination record if obtained from local pharmacy or Health Dept. Verbalized acceptance and understanding.  Covid-19  vaccine status: Completed vaccines  Qualifies for Shingles Vaccine? Yes   Zostavax completed No   Shingrix Completed?: Yes  Screening Tests Health Maintenance  Topic Date Due   Hepatitis C Screening  Never done   Pneumonia Vaccine 62+ Years old (2 - PPSV23 or PCV20) 09/22/2020   COVID-19 Vaccine (4 - Moderna risk series) 03/11/2021   INFLUENZA VACCINE  04/05/2022   MAMMOGRAM  06/29/2023   COLONOSCOPY (Pts 45-4yrs Insurance coverage will need to be confirmed)  09/18/2023   TETANUS/TDAP  09/05/2028   DEXA SCAN  Completed   Zoster Vaccines- Shingrix  Completed   HPV VACCINES  Aged Out    Health Maintenance  Health Maintenance Due  Topic Date Due  Hepatitis C Screening  Never done   Pneumonia Vaccine 67+ Years old (2 - PPSV23 or PCV20) 09/22/2020   COVID-19 Vaccine (4 - Moderna risk series) 03/11/2021   INFLUENZA VACCINE  04/05/2022    Colorectal cancer screening: Type of screening: Colonoscopy. Completed 2020. Repeat every 5 years  Mammogram status: Ordered today. Pt provided with contact info and advised to call to schedule appt.   Bone Density status: Ordered today. Pt provided with contact info and advised to call to schedule appt.  Lung Cancer Screening: (Low Dose CT Chest recommended if Age 75-80 years, 30 pack-year currently smoking OR have quit w/in 15years.) does not qualify.   Lung Cancer Screening Referral: na  Additional Screening:  Hepatitis C Screening: does qualify; Completed test ordered  Vision Screening: Recommended annual ophthalmology exams for early detection of glaucoma and other disorders of the eye. Is the patient up to date with their annual eye exam?  Yes  Who is the provider or what is the name of the office in which the patient attends annual eye exams?  If pt is not established with a provider, would they like to be referred to a provider to establish care? No .   Dental Screening: Recommended annual dental exams for proper oral  hygiene  Community Resource Referral / Chronic Care Management: CRR required this visit?  No   CCM required this visit?  No      Plan:     I have personally reviewed and noted the following in the patient's chart:   Medical and social history Use of alcohol, tobacco or illicit drugs  Current medications and supplements including opioid prescriptions. Patient is not currently taking opioid prescriptions. Functional ability and status Nutritional status Physical activity Advanced directives List of other physicians Hospitalizations, surgeries, and ER visits in previous 12 months Vitals Screenings to include cognitive, depression, and falls Referrals and appointments  In addition, I have reviewed and discussed with patient certain preventive protocols, quality metrics, and best practice recommendations. A written personalized care plan for preventive services as well as general preventive health recommendations were provided to patient.     Eual Fines, LPN   03/06/946   Nurse Notes: Schedule your next wellness visit for 1 year at checkout

## 2022-05-12 ENCOUNTER — Ambulatory Visit (INDEPENDENT_AMBULATORY_CARE_PROVIDER_SITE_OTHER): Payer: No Typology Code available for payment source | Admitting: Psychiatry

## 2022-05-12 ENCOUNTER — Other Ambulatory Visit: Payer: Self-pay | Admitting: *Deleted

## 2022-05-12 ENCOUNTER — Telehealth: Payer: Self-pay

## 2022-05-12 ENCOUNTER — Telehealth: Payer: Self-pay | Admitting: Family Medicine

## 2022-05-12 DIAGNOSIS — F319 Bipolar disorder, unspecified: Secondary | ICD-10-CM

## 2022-05-12 DIAGNOSIS — K449 Diaphragmatic hernia without obstruction or gangrene: Secondary | ICD-10-CM

## 2022-05-12 NOTE — Telephone Encounter (Signed)
Pt given 2 boxes of samples of Ingrezza 60 mg

## 2022-05-12 NOTE — Telephone Encounter (Signed)
Pt called stating she has some questions in regards to what she spoke to you about yesterday. Can you please call pt when available? Patel pt but pt requested to speak with you?

## 2022-05-12 NOTE — Progress Notes (Signed)
[facility] Counselor/Therapist Progress Note  Patient ID: Megan Whitney, MRN: [id number],    Date: 05/12/2022  Time Spent: 55 minutes   Treatment Type: Individual Therapy  Reported Symptoms: anxiety, "mild depression", anger, irritable, some confusion and "not great impulse control"  Mental Status Exam:  Appearance:   Casual     Behavior:  Appropriate, Sharing, and Motivated  Motor:  Normal  Speech/Language:   Clear and Coherent  Affect:  Anxious, some depression, irritablity  Mood:  anxious, depressed, and irritable  Thought process:  goal directed  Thought content:    overthinking  Sensory/Perceptual disturbances:    WNL  Orientation:  oriented to person, place, time/date, situation, day of week, month of year, year, and stated date of Sept. 7, 2023  Attention:  Good  Concentration:  Good and Fair  Memory:  Some forgetting and thinks it may be med-related and Dr. Clovis Pu is gradually taking her off [place] of knowledge:   Good  Insight:    Good and Fair  Judgment:   Good "most of the time"  Impulse Control:  Good and Fair   Risk Assessment: Danger to Self:  No Self-injurious Behavior: No Danger to Others: No Duty to Warn:no Physical Aggression / Violence:No  Access to Firearms a concern: No  Gang Involvement:No   Subjective:  Patient today reporting anxiety, some "mild" depression, some confusion occasionally, anger, irritability, and some impulse control issues "but not all the time."  She reports symptoms are mostly related to personal, marital, and family issues. Needing to focus more on her anxiety, irritability, and anger.  States her anxiety and irritability are stressing her more, and "husband is back to heavier consumption of alcohol" which frustrates patient but "I've decided I'm not going to nag him about it anymore." Shares more about how she has back off husband more and "it does help our relationship some." Working on trying not to imagine the  worst case scenarios, "I'm not as bad as I was on that and do catch myself in the midst of it and try to change it around."  Processing today some stressful family relationships on her side of family, about which patient "is feeling scared that husband's sister is mad at her and won't have anything to do with me."  This was a good example of where patient was "assuming worst case scenarios rather than understanding she really didn't have all the facts." Encouraged to focus more on the things she can control or change versus cannot, and is at times showing some progress in this.  Interventions: Cognitive Behavioral Therapy and Ego-Supportive  Long Term Goal: Reduce overall level, frequency, and intensity of the anxiety so that daily functioning is not impaired. Short Term Goal: 1.Increase understanding of the beliefs and messages that produce the worry and anxiety. Strategies: 1.Help client develop reality-based positive cognitive messages/self-talk. 2. Develop a "coping card" or other reminder which coping strategies are recorded for patient's later use.  Diagnosis:   ICD-10-CM   1. Bipolar I disorder with rapid cycling ([place])  F31.9      Plan:  Patient in today showing motivation and good participation in session even though she states she has not felt quite as motivated more recently, she did show it in session today.  Worked on her anxiety, anger, irritability, and some impulse control issues that are more infrequent but still happen at times.  Reports relationship with her husband is some calmer because "I decided I  am not going to nag him any more even when he is not making good decisions".  Continues to work on not assuming worst case scenarios and reports that she is having some success with this but realizes she really needs to work on it more".  Worked with some specific examples in session about this which seemed to be helpful to patient.  Is in a very challenging situation and she herself  has a lot of challenging stressors with which to cope.  Does show commitment to her goals and some progress especially in trying to look for more positives, not respond impulsively to situations or people, and trying to maintain some healthy relationships versus those that are not healthy. Encouraged patient in her practice of more positive behaviors as discussed in sessions including: Setting limits with others who are not supportive, believing more in herself and her abilities to manage difficult and unpredictable circumstances, focus on making sound decisions, continued contact with supportive friends and family, use of journaling between sessions as well as some reading that has proven to be helpful to her in the past, taking breaks occasionally from her electronics, positive self talk, finding the strengths and positives within herself and be able to name them, stop assuming worst case scenarios, getting outside daily, spending time with her 2 dogs who are very therapeutic for her, and recognize the strength she shows when working with goal directed behaviors to move in a direction that supports her improved coping and emotional health.  Goal review and progress/challenges noted with patient.  Next appointment within 2 to 3 weeks.  This record has been created using [employer].  Chart creation errors have been sought, but may not always have been located and corrected.  Such creation errors do not reflect on the standard of medical care provided.   Shanon Ace, LCSW

## 2022-05-17 ENCOUNTER — Telehealth: Payer: Self-pay

## 2022-05-17 NOTE — Telephone Encounter (Signed)
Reduce clozapine to 75 mg HS to see if that is the problem.

## 2022-05-17 NOTE — Telephone Encounter (Signed)
Pt reports sleeping 10-11 hours each night. Not sure I got that in the message.

## 2022-05-17 NOTE — Telephone Encounter (Signed)
Pt contacted nurse to update Dr. Clovis Pu with her side effects and symptoms since increasing her Clozapine to 100 mg and increasing her Ingrezza to 60 mg, she is not sure where her issues could be coming from. She reports sleeping until 9:30 am and she is still tired, sleepy, and groggy. She reports she is unable to function until 11:00 AM. She reports when she wakes up she is slurring her speech, she is unable to think clearly. She reports her husband thinks she shouldn't be driving at all. She is having hand tremors and dropping things. She reports drooling terribly day and night.   Informed her I would update Dr. Clovis Pu when I could since out of office and she is very Patent attorney.

## 2022-05-18 ENCOUNTER — Other Ambulatory Visit: Payer: Self-pay

## 2022-05-18 DIAGNOSIS — F319 Bipolar disorder, unspecified: Secondary | ICD-10-CM | POA: Diagnosis not present

## 2022-05-18 DIAGNOSIS — G3184 Mild cognitive impairment, so stated: Secondary | ICD-10-CM | POA: Diagnosis not present

## 2022-05-18 DIAGNOSIS — Z1159 Encounter for screening for other viral diseases: Secondary | ICD-10-CM | POA: Diagnosis not present

## 2022-05-18 DIAGNOSIS — Z79899 Other long term (current) drug therapy: Secondary | ICD-10-CM | POA: Diagnosis not present

## 2022-05-18 MED ORDER — CLOZAPINE 50 MG PO TABS: 75.0000 mg | ORAL_TABLET | Freq: Every day | ORAL | 0 refills | Status: DC

## 2022-05-18 NOTE — Telephone Encounter (Signed)
Called pt lvmtrc

## 2022-05-18 NOTE — Telephone Encounter (Signed)
Spoke with pt and she will call back if she remembers what she wanted to ask

## 2022-05-18 NOTE — Telephone Encounter (Signed)
Notified pt of decrease

## 2022-05-19 ENCOUNTER — Encounter: Payer: Self-pay | Admitting: Internal Medicine

## 2022-05-19 ENCOUNTER — Ambulatory Visit (INDEPENDENT_AMBULATORY_CARE_PROVIDER_SITE_OTHER): Payer: No Typology Code available for payment source | Admitting: Internal Medicine

## 2022-05-19 ENCOUNTER — Encounter: Payer: Self-pay | Admitting: Psychiatry

## 2022-05-19 VITALS — BP 128/82 | HR 79 | Resp 18 | Ht 60.0 in | Wt 192.4 lb

## 2022-05-19 DIAGNOSIS — F319 Bipolar disorder, unspecified: Secondary | ICD-10-CM

## 2022-05-19 DIAGNOSIS — Z23 Encounter for immunization: Secondary | ICD-10-CM

## 2022-05-19 DIAGNOSIS — L304 Erythema intertrigo: Secondary | ICD-10-CM | POA: Diagnosis not present

## 2022-05-19 LAB — B12 AND FOLATE PANEL
Folate: 15.6 ng/mL (ref >3.0)
Vitamin B-12: 574 pg/mL (ref 232–1245)

## 2022-05-19 MED ORDER — KETOCONAZOLE 2 % EX CREA: 1.0000 | TOPICAL_CREAM | Freq: Every day | CUTANEOUS | 0 refills | Status: DC

## 2022-05-19 NOTE — Assessment & Plan Note (Signed)
On Lithium, Lamictal, Ingrezza, clozapine and Focalin Follows up with Psychiatry Recent folic acid and C34 levels WNL MMSE - 30/30

## 2022-05-19 NOTE — Progress Notes (Signed)
Acute Office Visit  Subjective:    Patient ID: Megan Whitney, female    DOB: 1956/02/11, 66 y.o.   MRN: [id number]  Chief Complaint  Patient presents with   Rash    Rash under stomach really started getting worse 05-16-22 itches and burns has tried neosporin and cortisone cream     HPI Patient is in today for complaint of rash underneath abdominal folds for the last 4 to 5 days.  She has tried applying Neosporin and cortisone cream without much relief.  She has intense itching over the rash area.  She had concern about her recent blood test, which showed normal folic acid and I96 levels - ordered by her psychiatrist.  She was also worried about her memory, but her MMSE was 30/30 today.  Of note, she is on multiple medications for bipolar disorder and ADD. She follows up with Psychiatrist and has a therapist, who she visits every week.  Past Medical History:  Diagnosis Date   ADD (attention deficit disorder)    Allergy    Seasonal   Anemia    History of GI blood loss   Anxiety    Arthritis    Atrophy of vagina 10/07/2020   Bipolar 1 disorder (HCC)    Cancer (HCC)    Colon polyps    Depression    Diabetes mellitus ([place])    Edema, lower extremity    Epistaxis    Around 2011 or 2012, required cauterization.    Esophageal stricture    Fracture of superior pubic ramus (HCC) 11/28/2018   GERD (gastroesophageal reflux disease)    Headache(784.0)    Hyperlipidemia    Interstitial cystitis    Joint pain    Lactose intolerance    Lung cancer ([place]) 2002   Neuromuscular disorder ([place])    Obesity    Osteoarthritis    Palpitations    Sleep apnea    Doesn't use a CPAP   Suicidal ideation 01/20/2020   Swallowing difficulty    Tardive dyskinesia     Past Surgical History:  Procedure Laterality Date   BALLOON DILATION  05/16/2012   Procedure: BALLOON DILATION;  Surgeon: Inda Castle, MD;  Location: [facility];  Service: Endoscopy;  Laterality: N/A;   BUNIONECTOMY  2011    COLONOSCOPY     ENTEROSCOPY  05/16/2012   Procedure: ENTEROSCOPY;  Surgeon: Inda Castle, MD;  Location: [facility];  Service: Endoscopy;  Laterality: N/A;   JOINT REPLACEMENT     right shoulder durgery 25 yrs ago  [place]  2006, 2008   bilateral   [place]  2002   lung cancer    Family History  Problem Relation Age of Onset   Arthritis Mother    Hearing loss Mother    Hyperlipidemia Mother    Hypertension Mother    Depression Mother    Anxiety disorder Mother    Obesity Mother    Sudden death Mother    Hypertension Father    Diabetes Mellitus II Father    Heart disease Father    Arthritis Father    Cancer Father        Brain   COPD Father    Diabetes Father    Hyperlipidemia Father    Sleep apnea Father    Early death Sister        Aneroxia/Bulimic   Depression Brother    Early death Brother  Car Accident   Depression Daughter    Drug abuse Daughter    Heart disease Daughter    Hypertension Daughter    Stroke Maternal Grandmother    Hypertension Maternal Grandmother    Arthritis Maternal Grandfather    Heart attack Maternal Grandfather    Hearing loss Maternal Grandfather    Colon cancer Neg Hx    Esophageal cancer Neg Hx    Rectal cancer Neg Hx     Social History   Socioeconomic History   Marital status: Married    Spouse name: Not on file   Number of children: 1   Years of education: Not on file   Highest education level: Not on file  Occupational History   Occupation: Admin. assistant  Tobacco Use   Smoking status: Never   Smokeless tobacco: Never  Vaping Use   Vaping Use: Never used  Substance and Sexual Activity   Alcohol use: Yes    Alcohol/week: 1.0 standard drink of alcohol    Types: 1 Glasses of wine per week    Comment: Moderate   Drug use: No   Sexual activity: Yes  Other Topics Concern   Not on file  Social History Narrative   Pt lives in [place] with husband  Lanny Hurst.  Followed by Dr. Clovis Pu for psychiatry and Rinaldo Cloud for therapy.   Social Determinants of Health   Financial Resource Strain: Low Risk  (05/11/2022)   Overall Financial Resource Strain (CARDIA)    Difficulty of Paying Living Expenses: Not hard at all  Food Insecurity: No Food Insecurity (05/11/2022)   Hunger Vital Sign    Worried About Running Out of Food in the Last Year: Never true    Ran Out of Food in the Last Year: Never true  Transportation Needs: No Transportation Needs (05/11/2022)   PRAPARE - Hydrologist (Medical): No    Lack of Transportation (Non-Medical): No  Physical Activity: Inactive (05/11/2022)   Exercise Vital Sign    Days of Exercise per Week: 0 days    Minutes of Exercise per Session: 0 min  Stress: No Stress Concern Present (05/09/2021)   [place]    Feeling of Stress : Only a little  Social Connections: Moderately Integrated (05/11/2022)   Social Connection and Isolation Panel [NHANES]    Frequency of Communication with Friends and Family: More than three times a week    Frequency of Social Gatherings with Friends and Family: More than three times a week    Attends Religious Services: More than 4 times per year    Active Member of [employer] or Organizations: No    Attends Archivist Meetings: Never    Marital Status: Married  Human resources officer Violence: Not At Risk (05/09/2021)   Humiliation, Afraid, Rape, and Kick questionnaire    Fear of Current or Ex-Partner: No    Emotionally Abused: No    Physically Abused: No    Sexually Abused: No    Outpatient Medications Prior to Visit  Medication Sig Dispense Refill   acetaminophen (TYLENOL) 650 MG CR tablet Take 1,300 mg by mouth as needed for pain.     atorvastatin (LIPITOR) 20 MG tablet TAKE (1) TABLET BY MOUTH AT BEDTIME. 90 tablet 0   cloZAPine (CLOZARIL) 50 MG tablet Take 1.5 tablets (75 mg total) by mouth  at bedtime. 11 tablet 0   dexmethylphenidate (FOCALIN) 10 MG tablet TAKE 1 AND 1/2 TABLETS BY  MOUTH TWICE DAILY. 90 tablet 0   lamoTRIgine (LAMICTAL) 200 MG tablet TAKE (1) TABLET BY MOUTH TWICE DAILY. 180 tablet 0   lithium carbonate 150 MG capsule TAKE (1) CAPSULE BY MOUTH AT BEDTIME. 90 capsule 0   Melatonin 10 MG CAPS Take by mouth at bedtime as needed.     pantoprazole (PROTONIX) 40 MG tablet Take 1 tablet (40 mg total) by mouth 2 (two) times daily. 180 tablet 3   valbenazine (INGREZZA) 60 MG capsule Take 1 capsule (60 mg total) by mouth daily. 30 capsule 1   No facility-administered medications prior to visit.    Allergies  Allergen Reactions   Azithromycin Anaphylaxis   Penicillins Anaphylaxis    DID THE REACTION INVOLVE: Swelling of the face/tongue/throat, SOB, or low BP? Yes Sudden or severe rash/hives, skin peeling, or the inside of the mouth or nose? Yes Did it require medical treatment? No When did it last happen?       If all above answers are "NO", may proceed with cephalosporin use.  Patient reacts to Z pack.  HAS Taken amoxicillin fine.   Adhesive [Tape] Other (See Comments)    On bandaids    Review of Systems  Constitutional:  Negative for chills and fever.  HENT:  Negative for congestion, sinus pressure, sinus pain and sore throat.   Eyes:  Negative for pain and discharge.  Respiratory:  Negative for cough and shortness of breath.   Cardiovascular:  Negative for palpitations.  Gastrointestinal:  Negative for diarrhea, nausea and vomiting.  Endocrine: Negative for polydipsia and polyuria.  Genitourinary:  Negative for dysuria and hematuria.  Musculoskeletal:  Negative for neck pain and neck stiffness.  Skin:  Positive for rash.  Neurological:  Negative for dizziness and weakness.  Psychiatric/Behavioral:  Positive for sleep disturbance. Negative for agitation and behavioral problems. The patient is nervous/anxious.        Objective:    Physical  Exam Vitals reviewed.  Constitutional:      General: She is not in acute distress.    Appearance: She is obese. She is not diaphoretic.  HENT:     Head: Normocephalic and atraumatic.     Nose: Nose normal. No nasal tenderness.     Right Sinus: No maxillary sinus tenderness.     Left Sinus: No maxillary sinus tenderness.     Mouth/Throat:     Mouth: Mucous membranes are moist.  Eyes:     General: No scleral icterus.    Extraocular Movements: Extraocular movements intact.  Cardiovascular:     Rate and Rhythm: Normal rate and regular rhythm.     Pulses: Normal pulses.     Heart sounds: Normal heart sounds. No murmur heard. Pulmonary:     Breath sounds: Normal breath sounds. No wheezing or rales.  Musculoskeletal:     Cervical back: Neck supple. No tenderness.     Right lower leg: No edema.     Left lower leg: No edema.  Skin:    General: Skin is warm.     Findings: Rash (Erythematous rash underneath abdominal fold) present.  Neurological:     General: No focal deficit present.     Mental Status: She is alert and oriented to person, place, and time.     Sensory: No sensory deficit.     Motor: No weakness.     Gait: Gait abnormal.     Comments: Resting tremor of right hand  Psychiatric:        Behavior: Behavior  normal.        Thought Content: Thought content normal.     BP 128/82 (BP Location: Right Arm, Patient Position: Sitting, Cuff Size: Normal)   Pulse 79   Resp 18   Ht 5' (1.524 m)   Wt 192 lb 6.4 oz (87.3 kg)   SpO2 97%   BMI 37.58 kg/m  Wt Readings from Last 3 Encounters:  05/19/22 192 lb 6.4 oz (87.3 kg)  03/22/22 189 lb 6.4 oz (85.9 kg)  02/02/22 182 lb (82.6 kg)        Assessment & Plan:   Problem List Items Addressed This Visit       Musculoskeletal and Integument   Intertrigo - Primary    Ketoconazole cream prescribed Advised to keep area clean and dry      Relevant Medications   ketoconazole (NIZORAL) 2 % cream     Other   Bipolar  disorder ([place])    On Lithium, Lamictal, Ingrezza, clozapine and Focalin Follows up with Psychiatry Recent folic acid and S97 levels WNL MMSE - 30/30      Other Visit Diagnoses     Need for immunization against influenza       Relevant Orders   Flu Vaccine QUAD High Dose(Fluad) (Completed)        Meds ordered this encounter  Medications   ketoconazole (NIZORAL) 2 % cream    Sig: Apply 1 Application topically daily.    Dispense:  30 g    Refill:  0     Imara Standiford Keith Rake, MD

## 2022-05-19 NOTE — Telephone Encounter (Signed)
FYI, pt took lower dose of Clozapine 75 mg and is feeling better. Dr. Clovis Pu aware.

## 2022-05-19 NOTE — Patient Instructions (Signed)
Please apply Ketoconazole cream in the rash area. Keep area clean and dry.  Please take Tylenol as needed for knee pain.  Okay to take Folic acid 768 mcg and B12 500 mcg once daily.

## 2022-05-19 NOTE — Assessment & Plan Note (Signed)
Ketoconazole cream prescribed Advised to keep area clean and dry

## 2022-05-19 NOTE — Telephone Encounter (Signed)
Patient made in office appt with patel

## 2022-05-21 LAB — HEPATITIS C ANTIBODY: Hep C Virus Ab: NONREACTIVE

## 2022-05-23 ENCOUNTER — Telehealth: Payer: Self-pay

## 2022-05-23 ENCOUNTER — Ambulatory Visit (INDEPENDENT_AMBULATORY_CARE_PROVIDER_SITE_OTHER): Payer: No Typology Code available for payment source | Admitting: Surgery

## 2022-05-23 ENCOUNTER — Encounter: Payer: Self-pay | Admitting: Surgery

## 2022-05-23 VITALS — BP 136/86 | HR 112 | Temp 98.9°F | Ht 60.0 in | Wt 192.0 lb

## 2022-05-23 DIAGNOSIS — K449 Diaphragmatic hernia without obstruction or gangrene: Secondary | ICD-10-CM | POA: Diagnosis not present

## 2022-05-23 NOTE — Telephone Encounter (Signed)
Pt called to report she increased her Ingrezza at last office visit to 60 mg, she doesn't feel like its helped and asking what she should do. She is almost out of 60 mg samples. Do you want her to increase dose or ?

## 2022-05-23 NOTE — Telephone Encounter (Signed)
Okay to increase Ingrezza to 80 mg twice daily if she wants to do so now.  If she wants to give 60 mg twice a day another week or 2 we can give her samples of that.

## 2022-05-23 NOTE — Patient Instructions (Addendum)
Please go to have lab drawn prior to CT.  Your CT is scheduled for 05/28/2022 at 10:30 am (arrive by 10:15 am) @ [facility] in [place]. Nothing to eat or drink 4 hours prior. Please pick up contrast between now and Friday 05/27/22.  Your Barium Swallow is scheduled for 06/01/2022 at 10 am (arrive by 9:30 am) @ [facility]. Nothing to eat or drink 3 hours prior.  If you have any concerns or questions, please feel free to call our office. See follow up appointment below.   Hiatal Hernia  A hiatal hernia occurs when part of the stomach slides above the muscle that separates the abdomen from the chest (diaphragm). A person can be born with a hiatal hernia (congenital), or it may develop over time. In almost all cases of hiatal hernia, only the top part of the stomach pushes through the diaphragm. Many people have a hiatal hernia with no symptoms. The larger the hernia, the more likely it is that you will have symptoms. In some cases, a hiatal hernia allows stomach acid to flow back into the tube that carries food from your mouth to your stomach (esophagus). This may cause heartburn symptoms. The development of heartburn symptoms may mean that you have a condition called gastroesophageal reflux disease (GERD). What are the causes? This condition is caused by a weakness in the opening (hiatus) where the esophagus passes through the diaphragm to attach to the upper part of the stomach. A person may be born with a weakness in the hiatus, or a weakness can develop over time. What increases the risk? This condition is more likely to develop in: Older people. Age is a major risk factor for a hiatal hernia, especially if you are over the age of 27. Pregnant women. People who are overweight. People who have frequent constipation. What are the signs or symptoms? Symptoms of this condition usually develop in the form of GERD symptoms. Symptoms include: Heartburn. Upset stomach  (indigestion). Trouble swallowing. Coughing or wheezing. Wheezing is making high-pitched whistling sounds when you breathe. Sore throat. Chest pain. Nausea and vomiting. How is this diagnosed? This condition may be diagnosed during testing for GERD. Tests that may be done include: X-rays of your stomach or chest. An upper gastrointestinal (GI) series. This is an X-ray exam of your GI tract that is taken after you swallow a chalky liquid that shows up clearly on the X-ray. Endoscopy. This is a procedure to look into your stomach using a thin, flexible tube that has a tiny camera and light on the end of it. How is this treated? This condition may be treated by: Dietary and lifestyle changes to help reduce GERD symptoms. Medicines. These may include: Over-the-counter antacids. Medicines that make your stomach empty more quickly. Medicines that block the production of stomach acid (H2 blockers). Stronger medicines to reduce stomach acid (proton pump inhibitors). Surgery to repair the hernia, if other treatments are not helping. If you have no symptoms, you may not need treatment. Follow these instructions at home: Lifestyle and activity Do not use any products that contain nicotine or tobacco. These products include cigarettes, chewing tobacco, and vaping devices, such as e-cigarettes. If you need help quitting, ask your health care provider. Try to achieve and maintain a healthy body weight. Avoid putting pressure on your abdomen. Anything that puts pressure on your abdomen increases the amount of acid that may be pushed up into your esophagus. Avoid bending over, especially after eating. Raise the head of  your bed by putting blocks under the legs. This keeps your head and esophagus higher than your stomach. Do not wear tight clothing around your chest or stomach. Try not to strain when having a bowel movement, when urinating, or when lifting heavy objects. Eating and drinking Avoid foods  that can worsen GERD symptoms. These may include: Fatty foods, like fried foods. Citrus fruits, like oranges or lemon. Other foods and drinks that contain acid, like orange juice or tomatoes. Spicy food. Chocolate. Eat frequent small meals instead of three large meals a day. This helps prevent your stomach from getting too full. Eat slowly. Do not lie down right after eating. Do not eat 1-2 hours before bed. Do not drink beverages with caffeine. These include cola, coffee, cocoa, and tea. Do not drink alcohol. General instructions Take over-the-counter and prescription medicines only as told by your health care provider. Keep all follow-up visits. Your health care provider will want to check that any new prescribed medicines are helping your symptoms. Contact a health care provider if: Your symptoms are not controlled with medicines or lifestyle changes. You are having trouble swallowing. You have coughing or wheezing that will not go away. Your pain is getting worse. Your pain spreads to your arms, neck, jaw, teeth, or back. You feel nauseous or you vomit. Get help right away if: You have shortness of breath. You vomit blood. You have bright red blood in your stools. You have black, tarry stools. These symptoms may be an emergency. Get help right away. Call 911. Do not wait to see if the symptoms will go away. Do not drive yourself to the hospital. Summary A hiatal hernia occurs when part of the stomach slides above the muscle that separates the abdomen from the chest. A person may be born with a weakness in the hiatus, or a weakness can develop over time. Symptoms of a hiatal hernia may include heartburn, trouble swallowing, or sore throat. Management of a hiatal hernia includes eating frequent small meals instead of three large meals a day. Get help right away if you vomit blood, have bright red blood in your stools, or have black, tarry stools. This information is not intended  to replace advice given to you by your health care provider. Make sure you discuss any questions you have with your health care provider. Document Revised: 10/19/2021 Document Reviewed: 10/19/2021 Elsevier Patient Education  [place].

## 2022-05-23 NOTE — Progress Notes (Unsigned)
Patient ID: Megan Whitney, female   DOB: October 01, 1955, 66 y.o.   MRN: [id number]  HPI Megan Whitney is a 66 y.o. female seen in consultation at the request of Dr. Posey Pronto.  She does have a long history of a paraesophageal hernia.  Most recently she did undergo EGD ( 02/2022) that I  have personally reviewed the images.  There is evidence of a large hiatal hernia with a stricture and Cameron ulcers.  Biopsies showed evidence of inflammation but no evidence of cancer. Endorses reflux worsening when she lays supine.  She also had some dysphagia that improved significantly after last dilation She Does have a history of bipolar disorder but is very well controlled.  No recent hospitalizations. Did have a recent CBC that was completely normal with normal platelets and normal hemoglobin. Able to perform more than 4 METS of activity without any shortness of breath or chest pain. Also had a chest x-ray that I personally reviewed showing evidence of a large hiatal hernia. Also experiences some chronic cough and asthma-like symptoms. Did have history of lung cancer and underwent resection several years ago.  She recovered well without any complications.  She is diabetic and has anxiety as well as bipolar disorder.  She Does have tardive dyskinesia   HPI  Past Medical History:  Diagnosis Date   ADD (attention deficit disorder)    Allergy    Seasonal   Anemia    History of GI blood loss   Anxiety    Arthritis    Atrophy of vagina 10/07/2020   Bipolar 1 disorder (HCC)    Cancer ([place])    Colon polyps    Depression    Diabetes mellitus ([place])    Edema, lower extremity    Epistaxis    Around 2011 or 2012, required cauterization.    Esophageal stricture    Fracture of superior pubic ramus (HCC) 11/28/2018   GERD (gastroesophageal reflux disease)    Headache(784.0)    Hyperlipidemia    Interstitial cystitis    Joint pain    Lactose intolerance    Lung cancer ([place]) 2002   Neuromuscular disorder  ([place])    Obesity    Osteoarthritis    Palpitations    Sleep apnea    Doesn't use a CPAP   Suicidal ideation 01/20/2020   Swallowing difficulty    Tardive dyskinesia     Past Surgical History:  Procedure Laterality Date   BALLOON DILATION  05/16/2012   Procedure: BALLOON DILATION;  Surgeon: Inda Castle, MD;  Location: [facility];  Service: Endoscopy;  Laterality: N/A;   BUNIONECTOMY  2011   COLONOSCOPY     ENTEROSCOPY  05/16/2012   Procedure: ENTEROSCOPY;  Surgeon: Inda Castle, MD;  Location: [facility];  Service: Endoscopy;  Laterality: N/A;   JOINT REPLACEMENT     right shoulder durgery 25 yrs ago  [place]  2006, 2008   bilateral   TUBAL LIGATION  1990   WEDGE RESECTION  2002   lung cancer    Family History  Problem Relation Age of Onset   Arthritis Mother    Hearing loss Mother    Hyperlipidemia Mother    Hypertension Mother    Depression Mother    Anxiety disorder Mother    Obesity Mother    Sudden death Mother    Hypertension Father    Diabetes Mellitus II Father    Heart disease Father    Arthritis Father  Cancer Father        Brain   COPD Father    Diabetes Father    Hyperlipidemia Father    Sleep apnea Father    Early death Sister        Aneroxia/Bulimic   Depression Brother    Early death Proofreader Accident   Depression Daughter    Drug abuse Daughter    Heart disease Daughter    Hypertension Daughter    Stroke Maternal Grandmother    Hypertension Maternal Grandmother    Arthritis Maternal Grandfather    Heart attack Maternal Grandfather    Hearing loss Maternal Grandfather    Colon cancer Neg Hx    Esophageal cancer Neg Hx    Rectal cancer Neg Hx     Social History Social History   Tobacco Use   Smoking status: Never   Smokeless tobacco: Never  Vaping Use   Vaping Use: Never used  Substance Use Topics   Alcohol use: Yes    Alcohol/week: 1.0 standard drink of alcohol    Types: 1 Glasses of  wine per week    Comment: Moderate   Drug use: No    Allergies  Allergen Reactions   Azithromycin Anaphylaxis   Penicillins Anaphylaxis    DID THE REACTION INVOLVE: Swelling of the face/tongue/throat, SOB, or low BP? Yes Sudden or severe rash/hives, skin peeling, or the inside of the mouth or nose? Yes Did it require medical treatment? No When did it last happen?       If all above answers are "NO", may proceed with cephalosporin use.  Patient reacts to Z pack.  HAS Taken amoxicillin fine.   Adhesive [Tape] Other (See Comments)    On bandaids    Current Outpatient Medications  Medication Sig Dispense Refill   acetaminophen (TYLENOL) 650 MG CR tablet Take 1,300 mg by mouth as needed for pain.     atorvastatin (LIPITOR) 20 MG tablet TAKE (1) TABLET BY MOUTH AT BEDTIME. 90 tablet 0   cloZAPine (CLOZARIL) 50 MG tablet Take 1.5 tablets (75 mg total) by mouth at bedtime. 11 tablet 0   dexmethylphenidate (FOCALIN) 10 MG tablet TAKE 1 AND 1/2 TABLETS BY MOUTH TWICE DAILY. 90 tablet 0   ketoconazole (NIZORAL) 2 % cream Apply 1 Application topically daily. 30 g 0   lamoTRIgine (LAMICTAL) 200 MG tablet TAKE (1) TABLET BY MOUTH TWICE DAILY. 180 tablet 0   lithium carbonate 150 MG capsule TAKE (1) CAPSULE BY MOUTH AT BEDTIME. 90 capsule 0   Melatonin 10 MG CAPS Take by mouth at bedtime as needed.     pantoprazole (PROTONIX) 40 MG tablet Take 1 tablet (40 mg total) by mouth 2 (two) times daily. 180 tablet 3   valbenazine (INGREZZA) 60 MG capsule Take 1 capsule (60 mg total) by mouth daily. 30 capsule 1   No current facility-administered medications for this visit.     Review of Systems Full ROS  was asked and was negative except for the information on the HPI  Physical Exam Blood pressure 136/86, pulse (!) 112, temperature 98.9 F (37.2 C), temperature source Oral, height 5' (1.524 m), weight 192 lb (87.1 kg), SpO2 95 %. CONSTITUTIONAL: NAD BMI 37.5. EYES: Pupils are equal, round,  Sclera are non-icteric. EARS, NOSE, MOUTH AND THROAT: The oral mucosa is pink and moist. Hearing is intact to voice. LYMPH NODES:  Lymph nodes in the neck are normal. RESPIRATORY:  Lungs are clear.  There is normal respiratory effort, with equal breath sounds bilaterally, and without pathologic use of accessory muscles. CARDIOVASCULAR: Heart is regular without murmurs, gallops, or rubs. GI: The abdomen is  soft, nontender, and nondistended. There are no palpable masses. There is no hepatosplenomegaly. There are normal bowel sounds in all quadrants. GU: Rectal deferred.   MUSCULOSKELETAL: Normal muscle strength and tone. No cyanosis or edema.   SKIN: Turgor is good and there are no pathologic skin lesions or ulcers. NEUROLOGIC: Motor and sensation is grossly normal. Cranial nerves are grossly intact. PSYCH:  Oriented to person, place and time. Affect is normal.  Data Reviewed  I have personally reviewed the patient's imaging, laboratory findings and medical records.    Assessment/Plan 66 year old female with a paraesophageal hernia that is large that is causing significant symptoms both from pulmonary perspective and GI perspective to include strictures and Cameron's ulcers. I do definitely thinks that there is an indication to repair paraesophageal hernia.  Her BMI is 7.5 which is not ideal.  I had an extensive discussion with her about optimization of her weight before definitive surgical intervention to improve her outcomes.  I will like to get less than 35.  She will also need appropriate work-up with barium swallow as well as a CT scan of the abdomen and pelvis to evaluate the abdominal mediastinal anatomy. We will see her back once appropriate work-up is completed. SHe also wishes to wait serous her husband is sick and may need a little bit more time before definitive surgical intervention is done.   Please note that I have spent 60 minutes in this encounter including personally reviewing  imaging studies, counseling the patient ,coordinating her care, placing orders and performing appropriate documentation    Caroleen Hamman, MD FACS General Surgeon 05/23/2022, 4:19 PM

## 2022-05-23 NOTE — Progress Notes (Deleted)
Patient ID: Megan Whitney, female   DOB: 09/15/1955, 66 y.o.   MRN: [id number]  HPI Megan Whitney is a 66 y.o. female ***  HPI  Past Medical History:  Diagnosis Date   ADD (attention deficit disorder)    Allergy    Seasonal   Anemia    History of GI blood loss   Anxiety    Arthritis    Atrophy of vagina 10/07/2020   Bipolar 1 disorder (HCC)    Cancer (HCC)    Colon polyps    Depression    Diabetes mellitus ([place])    Edema, lower extremity    Epistaxis    Around 2011 or 2012, required cauterization.    Esophageal stricture    Fracture of superior pubic ramus (HCC) 11/28/2018   GERD (gastroesophageal reflux disease)    Headache(784.0)    Hyperlipidemia    Interstitial cystitis    Joint pain    Lactose intolerance    Lung cancer ([place]) 2002   Neuromuscular disorder ([place])    Obesity    Osteoarthritis    Palpitations    Sleep apnea    Doesn't use a CPAP   Suicidal ideation 01/20/2020   Swallowing difficulty    Tardive dyskinesia     Past Surgical History:  Procedure Laterality Date   BALLOON DILATION  05/16/2012   Procedure: BALLOON DILATION;  Surgeon: Inda Castle, MD;  Location: [facility];  Service: Endoscopy;  Laterality: N/A;   BUNIONECTOMY  2011   COLONOSCOPY     ENTEROSCOPY  05/16/2012   Procedure: ENTEROSCOPY;  Surgeon: Inda Castle, MD;  Location: [facility];  Service: Endoscopy;  Laterality: N/A;   JOINT REPLACEMENT     right shoulder durgery 25 yrs ago  [place]  2006, 2008   bilateral   TUBAL LIGATION  1990   WEDGE RESECTION  2002   lung cancer    Family History  Problem Relation Age of Onset   Arthritis Mother    Hearing loss Mother    Hyperlipidemia Mother    Hypertension Mother    Depression Mother    Anxiety disorder Mother    Obesity Mother    Sudden death Mother    Hypertension Father    Diabetes Mellitus II Father    Heart disease Father    Arthritis Father    Cancer Father        Brain   COPD  Father    Diabetes Father    Hyperlipidemia Father    Sleep apnea Father    Early death Sister        Aneroxia/Bulimic   Depression Brother    Early death Proofreader Accident   Depression Daughter    Drug abuse Daughter    Heart disease Daughter    Hypertension Daughter    Stroke Maternal Grandmother    Hypertension Maternal Grandmother    Arthritis Maternal Grandfather    Heart attack Maternal Grandfather    Hearing loss Maternal Grandfather    Colon cancer Neg Hx    Esophageal cancer Neg Hx    Rectal cancer Neg Hx     Social History Social History   Tobacco Use   Smoking status: Never   Smokeless tobacco: Never  Vaping Use   Vaping Use: Never used  Substance Use Topics   Alcohol use: Yes    Alcohol/week: 1.0 standard drink of alcohol    Types:  1 Glasses of wine per week    Comment: Moderate   Drug use: No    Allergies  Allergen Reactions   Azithromycin Anaphylaxis   Penicillins Anaphylaxis    DID THE REACTION INVOLVE: Swelling of the face/tongue/throat, SOB, or low BP? Yes Sudden or severe rash/hives, skin peeling, or the inside of the mouth or nose? Yes Did it require medical treatment? No When did it last happen?       If all above answers are "NO", may proceed with cephalosporin use.  Patient reacts to Z pack.  HAS Taken amoxicillin fine.   Adhesive [Tape] Other (See Comments)    On bandaids    Current Outpatient Medications  Medication Sig Dispense Refill   acetaminophen (TYLENOL) 650 MG CR tablet Take 1,300 mg by mouth as needed for pain.     atorvastatin (LIPITOR) 20 MG tablet TAKE (1) TABLET BY MOUTH AT BEDTIME. 90 tablet 0   cloZAPine (CLOZARIL) 50 MG tablet Take 1.5 tablets (75 mg total) by mouth at bedtime. 11 tablet 0   dexmethylphenidate (FOCALIN) 10 MG tablet TAKE 1 AND 1/2 TABLETS BY MOUTH TWICE DAILY. 90 tablet 0   ketoconazole (NIZORAL) 2 % cream Apply 1 Application topically daily. 30 g 0   lamoTRIgine (LAMICTAL) 200 MG tablet  TAKE (1) TABLET BY MOUTH TWICE DAILY. 180 tablet 0   lithium carbonate 150 MG capsule TAKE (1) CAPSULE BY MOUTH AT BEDTIME. 90 capsule 0   Melatonin 10 MG CAPS Take by mouth at bedtime as needed.     pantoprazole (PROTONIX) 40 MG tablet Take 1 tablet (40 mg total) by mouth 2 (two) times daily. 180 tablet 3   valbenazine (INGREZZA) 60 MG capsule Take 1 capsule (60 mg total) by mouth daily. 30 capsule 1   No current facility-administered medications for this visit.     Review of Systems Full ROS  was asked and was negative except for the information on the HPI  Physical Exam Blood pressure 136/86, pulse (!) 112, temperature 98.9 F (37.2 C), temperature source Oral, height 5' (1.524 m), weight 192 lb (87.1 kg), SpO2 95 %. CONSTITUTIONAL: ***. EYES: Pupils are equal, round, and reactive to light, Sclera are non-icteric. EARS, NOSE, MOUTH AND THROAT: The oropharynx is clear. The oral mucosa is pink and moist. Hearing is intact to voice. LYMPH NODES:  Lymph nodes in the neck are normal. RESPIRATORY:  Lungs are clear. There is normal respiratory effort, with equal breath sounds bilaterally, and without pathologic use of accessory muscles. CARDIOVASCULAR: Heart is regular without murmurs, gallops, or rubs. GI: The abdomen is *** soft, nontender, and nondistended. There are no palpable masses. There is no hepatosplenomegaly. There are normal bowel sounds in all quadrants. GU: Rectal deferred.   MUSCULOSKELETAL: Normal muscle strength and tone. No cyanosis or edema.   SKIN: Turgor is good and there are no pathologic skin lesions or ulcers. NEUROLOGIC: Motor and sensation is grossly normal. Cranial nerves are grossly intact. PSYCH:  Oriented to person, place and time. Affect is normal.  Data Reviewed  I have personally reviewed the patient's imaging, laboratory findings and medical records.    Assessment/Plan     Caroleen Hamman, MD FACS General Surgeon 05/23/2022, 4:18 PM

## 2022-05-24 NOTE — Telephone Encounter (Signed)
Noted thank you for clarification. Pt would like to increase to 80 mg Ingrezza daily and I will get her samples ready.

## 2022-05-24 NOTE — Telephone Encounter (Signed)
Correction Ingrezza is once daily dosing .  So she has the option to give it another week or 2 @ 60 mg daily to see if that will improve symptoms or if she is ready to go ahead and increase to 80 mg daily then she can do so and is welcomed to pick up samples as long as we have them.

## 2022-05-24 NOTE — Telephone Encounter (Signed)
Husband answered phone, patient still in bed. I asked him to have her call when convenient, not urgent.

## 2022-05-25 ENCOUNTER — Encounter: Payer: Self-pay | Admitting: Internal Medicine

## 2022-05-25 ENCOUNTER — Other Ambulatory Visit (HOSPITAL_COMMUNITY)
Admission: RE | Admit: 2022-05-25 | Discharge: 2022-05-25 | Disposition: A | Discharge status: Home / Self Care | Payer: No Typology Code available for payment source | Source: Ambulatory Visit | Attending: Surgery | Admitting: Surgery

## 2022-05-25 DIAGNOSIS — K449 Diaphragmatic hernia without obstruction or gangrene: Secondary | ICD-10-CM | POA: Diagnosis not present

## 2022-05-25 LAB — BASIC METABOLIC PANEL
Anion gap: 9 (ref 5–15)
BUN: 17 mg/dL (ref 8–23)
CO2: 22 mmol/L (ref 22–32)
Calcium: 9.3 mg/dL (ref 8.9–10.3)
Chloride: 107 mmol/L (ref 98–111)
Creatinine, Ser: 0.83 mg/dL (ref 0.44–1.00)
GFR, Estimated: >60 mL/min (ref >60)
Glucose, Bld: 112 mg/dL — ABNORMAL HIGH (ref 70–99)
Potassium: 4 mmol/L (ref 3.5–5.1)
Sodium: 138 mmol/L (ref 135–145)

## 2022-05-26 DIAGNOSIS — F319 Bipolar disorder, unspecified: Secondary | ICD-10-CM | POA: Diagnosis not present

## 2022-05-26 DIAGNOSIS — Z79899 Other long term (current) drug therapy: Secondary | ICD-10-CM | POA: Diagnosis not present

## 2022-05-27 ENCOUNTER — Encounter: Payer: Self-pay | Admitting: Psychiatry

## 2022-05-27 ENCOUNTER — Other Ambulatory Visit: Payer: Self-pay | Admitting: Psychiatry

## 2022-05-27 DIAGNOSIS — F319 Bipolar disorder, unspecified: Secondary | ICD-10-CM

## 2022-05-28 ENCOUNTER — Ambulatory Visit (HOSPITAL_BASED_OUTPATIENT_CLINIC_OR_DEPARTMENT_OTHER)
Admission: RE | Admit: 2022-05-28 | Discharge: 2022-05-28 | Disposition: A | Discharge status: Home / Self Care | Payer: No Typology Code available for payment source | Source: Ambulatory Visit | Attending: Surgery | Admitting: Surgery

## 2022-05-28 DIAGNOSIS — K449 Diaphragmatic hernia without obstruction or gangrene: Secondary | ICD-10-CM | POA: Diagnosis not present

## 2022-05-28 MED ORDER — IOHEXOL 300 MG/ML  SOLN
100.0000 mL | Freq: Once | INTRAMUSCULAR | Status: AC | PRN
  Administered 2022-05-28: 100 mL via INTRAVENOUS

## 2022-05-30 ENCOUNTER — Ambulatory Visit: Payer: No Typology Code available for payment source

## 2022-05-30 ENCOUNTER — Encounter: Payer: Self-pay | Admitting: Internal Medicine

## 2022-06-01 ENCOUNTER — Other Ambulatory Visit: Payer: Self-pay | Admitting: Psychiatry

## 2022-06-01 ENCOUNTER — Ambulatory Visit (HOSPITAL_COMMUNITY)
Admission: RE | Admit: 2022-06-01 | Discharge: 2022-06-01 | Disposition: A | Discharge status: Home / Self Care | Payer: No Typology Code available for payment source | Source: Ambulatory Visit | Attending: Surgery | Admitting: Surgery

## 2022-06-01 DIAGNOSIS — F314 Bipolar disorder, current episode depressed, severe, without psychotic features: Secondary | ICD-10-CM

## 2022-06-01 DIAGNOSIS — F319 Bipolar disorder, unspecified: Secondary | ICD-10-CM

## 2022-06-01 DIAGNOSIS — K449 Diaphragmatic hernia without obstruction or gangrene: Secondary | ICD-10-CM | POA: Insufficient documentation

## 2022-06-02 ENCOUNTER — Telehealth: Payer: Self-pay

## 2022-06-02 ENCOUNTER — Ambulatory Visit: Payer: No Typology Code available for payment source | Admitting: Psychiatry

## 2022-06-02 DIAGNOSIS — F319 Bipolar disorder, unspecified: Secondary | ICD-10-CM | POA: Diagnosis not present

## 2022-06-02 DIAGNOSIS — Z79899 Other long term (current) drug therapy: Secondary | ICD-10-CM | POA: Diagnosis not present

## 2022-06-02 DIAGNOSIS — G2401 Drug induced subacute dyskinesia: Secondary | ICD-10-CM

## 2022-06-02 MED ORDER — VALBENAZINE TOSYLATE 80 MG PO CAPS: 80.0000 mg | ORAL_CAPSULE | Freq: Every day | ORAL | 2 refills | Status: DC

## 2022-06-02 NOTE — Telephone Encounter (Signed)
Noted! Thank you

## 2022-06-02 NOTE — Progress Notes (Signed)
Normal B12 and folate.  No change indicated.

## 2022-06-02 NOTE — Telephone Encounter (Signed)
Pt called to report she is seeing improvement with the Ingrezza 80 mg after taking for 6 days. Will submit a new Rx to [facility].

## 2022-06-03 ENCOUNTER — Encounter: Payer: Self-pay | Admitting: Psychiatry

## 2022-06-06 ENCOUNTER — Other Ambulatory Visit: Payer: No Typology Code available for payment source

## 2022-06-07 ENCOUNTER — Ambulatory Visit: Payer: No Typology Code available for payment source | Admitting: Psychiatry

## 2022-06-07 DIAGNOSIS — F411 Generalized anxiety disorder: Secondary | ICD-10-CM

## 2022-06-07 NOTE — Progress Notes (Signed)
[facility] Counselor/Therapist Progress Note  Patient ID: LENIYAH MARTELL, MRN: [id number],    Date: 06/07/2022  Time Spent: 55 minutes   Treatment Type: Individual Therapy  Reported Symptoms: anxiety, anger, fear, depression "but not real bad"  Mental Status Exam:  Appearance:   Neat     Behavior:  Appropriate, Sharing, and Motivated  Motor:  Normal  Speech/Language:   Clear and Coherent  Affect:  anxious  Mood:  anxious  Thought process:  goal directed  Thought content:    Obsessive  thoughts  Sensory/Perceptual disturbances:    WNL  Orientation:  oriented to person, place, time/date, situation, day of week, month of year, year, and stated date of Oct. 3, 2023  Attention:  Fair  Concentration:  Fair  Memory:  Some short term issues and talked with PCP who told her she did not have dementia  Fund of knowledge:   Good  Insight:    Good and Fair  Judgment:   Good and Fair  Impulse Control:  Good and Fair   Risk Assessment: Danger to Self:  No Self-injurious Behavior: No Danger to Others: No Duty to Warn:no Physical Aggression / Violence:No  Access to Firearms a concern: No  Gang Involvement:No   Subjective:  Patient in today reporting anxiety, anger, fear, depression "but not real bad". Stated her husband with cancer is having significant pain, continues to drink heavily daily. "I have come to a reluctant understanding that Lanny Hurst probably won't live another 2 yrs, although doctors have said he may live 5 yrs."  Concerned about medical bills re: recent testing about hiatal hernia and plans to check with her insurance on this. Finds it difficult to stay in the present. Jumping ahead and and excessive worrying which we worked on more today.  Processed a lot of her anxiety, fears, anger, and depression which seemed helpful to patient and she was also able to recognize some significant strengths that she is showing in the midst of difficult and uncertain times with husband's  illness and some concerns of her own.  Impulsivity not as bad more recently. Good at reaching out to friends as we have encouraged on several occasions, and also showing better self-care and less self judging.  Is thinking of attending a different church that she has heard about through a friend as they offer various activities in which patient may have some interest, and also sees it as a good way of being around other people more.  Again encouraged her in looking at the things she actually can control versus so many things out of her control.  Shows more pro activity today and less blaming of herself.  Interventions: Cognitive Behavioral Therapy and Ego-Supportive   Long Term Goal: Reduce overall level, frequency, and intensity of the anxiety so that daily functioning is not impaired. Short Term Goal: 1.Increase understanding of the beliefs and messages that produce the worry and anxiety. Strategies: 1.Help client develop reality-based positive cognitive messages/self-talk. 2. Develop a "coping card" or other reminder which coping strategies are recorded for patient's later use.  Diagnosis:   ICD-10-CM   1. Generalized anxiety disorder  F41.1      Plan:  Patient today showing good participation and motivation in session focusing on her anxiety, fear, anger, and depression.  She reports that her depression is not as strong as it has been previously.  Showing a little more motivation especially in some of her own healthcare needs.  Frustrated with husband's continued  drinking despite what his cancer doctors suggested to him.  Processed a lot of her fears, anger, and anxieties "with so much being out of my control" and seems to be gaining a clear sense of the things that are out of her control, and able to step back and focus more on what is in her control including her own self-care and is being more proactive in that area.  Seems to be a little quicker and reaching out to other people, mostly her  friends and sometimes extended family.  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  Showing good commitment to her treatment goals and some progress she notices as she tries to look more for the positives versus negatives. Encouraged patient in practicing more of the positive behaviors as noted in session including: Believing more in herself and her abilities to manage difficult and unpredictable circumstances, setting limits with others who are not supportive, focused on making sound decisions, continued contact with supportive friends and family, use of journaling between sessions as well as some reading that has proven to be helpful to her in the past, taking breaks occasionally from her electronics, positive self talk, finding the strengths and positives within herself and be able to name them, stop assuming worst case scenarios, getting outside daily, spending time with her 2 dogs who are very therapeutic for her, and recognize the strength she shows when working with goal directed behaviors to move in a direction that supports her improved emotional health.  Goal review and progress/challenges noted with patient.  Next appointment within 2 to 3 weeks.  This record has been created using [employer].  Chart creation errors have been sought, but may not always have been located and corrected.  Such creation errors do not reflect on the standard of medical care provided.   Shanon Ace, LCSW

## 2022-06-09 ENCOUNTER — Ambulatory Visit: Payer: No Typology Code available for payment source | Admitting: Psychiatry

## 2022-06-09 DIAGNOSIS — F319 Bipolar disorder, unspecified: Secondary | ICD-10-CM | POA: Diagnosis not present

## 2022-06-09 DIAGNOSIS — Z79899 Other long term (current) drug therapy: Secondary | ICD-10-CM | POA: Diagnosis not present

## 2022-06-10 ENCOUNTER — Other Ambulatory Visit: Payer: Self-pay | Admitting: Internal Medicine

## 2022-06-10 ENCOUNTER — Encounter: Payer: Self-pay | Admitting: Psychiatry

## 2022-06-10 ENCOUNTER — Encounter: Payer: Self-pay | Admitting: Internal Medicine

## 2022-06-10 DIAGNOSIS — Z6836 Body mass index (BMI) 36.0-36.9, adult: Secondary | ICD-10-CM | POA: Diagnosis not present

## 2022-06-10 DIAGNOSIS — R2681 Unsteadiness on feet: Secondary | ICD-10-CM | POA: Diagnosis not present

## 2022-06-10 DIAGNOSIS — F319 Bipolar disorder, unspecified: Secondary | ICD-10-CM | POA: Diagnosis not present

## 2022-06-10 DIAGNOSIS — Z008 Encounter for other general examination: Secondary | ICD-10-CM | POA: Diagnosis not present

## 2022-06-10 DIAGNOSIS — E785 Hyperlipidemia, unspecified: Secondary | ICD-10-CM | POA: Diagnosis not present

## 2022-06-10 DIAGNOSIS — N393 Stress incontinence (female) (male): Secondary | ICD-10-CM | POA: Diagnosis not present

## 2022-06-10 DIAGNOSIS — G20C Parkinsonism, unspecified: Secondary | ICD-10-CM

## 2022-06-10 DIAGNOSIS — K219 Gastro-esophageal reflux disease without esophagitis: Secondary | ICD-10-CM | POA: Diagnosis not present

## 2022-06-10 DIAGNOSIS — R7303 Prediabetes: Secondary | ICD-10-CM | POA: Diagnosis not present

## 2022-06-13 ENCOUNTER — Encounter: Payer: Self-pay | Admitting: Surgery

## 2022-06-13 ENCOUNTER — Ambulatory Visit (INDEPENDENT_AMBULATORY_CARE_PROVIDER_SITE_OTHER): Payer: No Typology Code available for payment source | Admitting: Surgery

## 2022-06-13 ENCOUNTER — Encounter: Payer: Self-pay | Admitting: Neurology

## 2022-06-13 VITALS — BP 115/79 | HR 106 | Temp 98.3°F | Wt 191.0 lb

## 2022-06-13 DIAGNOSIS — K449 Diaphragmatic hernia without obstruction or gangrene: Secondary | ICD-10-CM | POA: Diagnosis not present

## 2022-06-13 NOTE — Telephone Encounter (Signed)
Spoke with patient wanted to make sure her insurance would be filed

## 2022-06-13 NOTE — Patient Instructions (Addendum)
Try to reach a weight goal of 175 lbs.  If you have any concerns or questions, please feel free to call our office. See follow up appointment below.   Calorie Counting for Weight Loss Calories are units of energy. Your body needs a certain number of calories from food to keep going throughout the day. When you eat or drink more calories than your body needs, your body stores the extra calories mostly as fat. When you eat or drink fewer calories than your body needs, your body burns fat to get the energy it needs. Calorie counting means keeping track of how many calories you eat and drink each day. Calorie counting can be helpful if you need to lose weight. If you eat fewer calories than your body needs, you should lose weight. Ask your health care provider what a healthy weight is for you. For calorie counting to work, you will need to eat the right number of calories each day to lose a healthy amount of weight per week. A dietitian can help you figure out how many calories you need in a day and will suggest ways to reach your calorie goal. A healthy amount of weight to lose each week is usually 1-2 lb (0.5-0.9 kg). This usually means that your daily calorie intake should be reduced by 500-750 calories. Eating 1,200-1,500 calories a day can help most women lose weight. Eating 1,500-1,800 calories a day can help most men lose weight. What do I need to know about calorie counting? Work with your health care provider or dietitian to determine how many calories you should get each day. To meet your daily calorie goal, you will need to: Find out how many calories are in each food that you would like to eat. Try to do this before you eat. Decide how much of the food you plan to eat. Keep a food log. Do this by writing down what you ate and how many calories it had. To successfully lose weight, it is important to balance calorie counting with a healthy lifestyle that includes regular activity. Where do I  find calorie information?  The number of calories in a food can be found on a Nutrition Facts label. If a food does not have a Nutrition Facts label, try to look up the calories online or ask your dietitian for help. Remember that calories are listed per serving. If you choose to have more than one serving of a food, you will have to multiply the calories per serving by the number of servings you plan to eat. For example, the label on a package of bread might say that a serving size is 1 slice and that there are 90 calories in a serving. If you eat 1 slice, you will have eaten 90 calories. If you eat 2 slices, you will have eaten 180 calories. How do I keep a food log? After each time that you eat, record the following in your food log as soon as possible: What you ate. Be sure to include toppings, sauces, and other extras on the food. How much you ate. This can be measured in cups, ounces, or number of items. How many calories were in each food and drink. The total number of calories in the food you ate. Keep your food log near you, such as in a pocket-sized notebook or on an app or website on your mobile phone. Some programs will calculate calories for you and show you how many calories you have left to  meet your daily goal. What are some portion-control tips? Know how many calories are in a serving. This will help you know how many servings you can have of a certain food. Use a measuring cup to measure serving sizes. You could also try weighing out portions on a kitchen scale. With time, you will be able to estimate serving sizes for some foods. Take time to put servings of different foods on your favorite plates or in your favorite bowls and cups so you know what a serving looks like. Try not to eat straight from a food's packaging, such as from a bag or box. Eating straight from the package makes it hard to see how much you are eating and can lead to overeating. Put the amount you would like to  eat in a cup or on a plate to make sure you are eating the right portion. Use smaller plates, glasses, and bowls for smaller portions and to prevent overeating. Try not to multitask. For example, avoid watching TV or using your computer while eating. If it is time to eat, sit down at a table and enjoy your food. This will help you recognize when you are full. It will also help you be more mindful of what and how much you are eating. What are tips for following this plan? Reading food labels Check the calorie count compared with the serving size. The serving size may be smaller than what you are used to eating. Check the source of the calories. Try to choose foods that are high in protein, fiber, and vitamins, and low in saturated fat, trans fat, and sodium. Shopping Read nutrition labels while you shop. This will help you make healthy decisions about which foods to buy. Pay attention to nutrition labels for low-fat or fat-free foods. These foods sometimes have the same number of calories or more calories than the full-fat versions. They also often have added sugar, starch, or salt to make up for flavor that was removed with the fat. Make a grocery list of lower-calorie foods and stick to it. Cooking Try to cook your favorite foods in a healthier way. For example, try baking instead of frying. Use low-fat dairy products. Meal planning Use more fruits and vegetables. One-half of your plate should be fruits and vegetables. Include lean proteins, such as chicken, [place], and fish. Lifestyle Each week, aim to do one of the following: 150 minutes of moderate exercise, such as walking. 75 minutes of vigorous exercise, such as running. General information Know how many calories are in the foods you eat most often. This will help you calculate calorie counts faster. Find a way of tracking calories that works for you. Get creative. Try different apps or programs if writing down calories does not work  for you. What foods should I eat?  Eat nutritious foods. It is better to have a nutritious, high-calorie food, such as an avocado, than a food with few nutrients, such as a bag of potato chips. Use your calories on foods and drinks that will fill you up and will not leave you hungry soon after eating. Examples of foods that fill you up are nuts and nut butters, vegetables, lean proteins, and high-fiber foods such as whole grains. High-fiber foods are foods with more than 5 g of fiber per serving. Pay attention to calories in drinks. Low-calorie drinks include water and unsweetened drinks. The items listed above may not be a complete list of foods and beverages you can eat. Contact a  dietitian for more information. What foods should I limit? Limit foods or drinks that are not good sources of vitamins, minerals, or protein or that are high in unhealthy fats. These include: Candy. Other sweets. Sodas, specialty coffee drinks, alcohol, and juice. The items listed above may not be a complete list of foods and beverages you should avoid. Contact a dietitian for more information. How do I count calories when eating out? Pay attention to portions. Often, portions are much larger when eating out. Try these tips to keep portions smaller: Consider sharing a meal instead of getting your own. If you get your own meal, eat only half of it. Before you start eating, ask for a container and put half of your meal into it. When available, consider ordering smaller portions from the menu instead of full portions. Pay attention to your food and drink choices. Knowing the way food is cooked and what is included with the meal can help you eat fewer calories. If calories are listed on the menu, choose the lower-calorie options. Choose dishes that include vegetables, fruits, whole grains, low-fat dairy products, and lean proteins. Choose items that are boiled, broiled, grilled, or steamed. Avoid items that are  buttered, battered, fried, or served with cream sauce. Items labeled as crispy are usually fried, unless stated otherwise. Choose water, low-fat milk, unsweetened iced tea, or other drinks without added sugar. If you want an alcoholic beverage, choose a lower-calorie option, such as a glass of wine or light beer. Ask for dressings, sauces, and syrups on the side. These are usually high in calories, so you should limit the amount you eat. If you want a salad, choose a garden salad and ask for grilled meats. Avoid extra toppings such as bacon, cheese, or fried items. Ask for the dressing on the side, or ask for olive oil and vinegar or lemon to use as dressing. Estimate how many servings of a food you are given. Knowing serving sizes will help you be aware of how much food you are eating at restaurants. Where to find more information Centers for Disease Control and Prevention: http://www.wolf.info/ U.S. Department of Agriculture: http://www.wilson-mendoza.org/ Summary Calorie counting means keeping track of how many calories you eat and drink each day. If you eat fewer calories than your body needs, you should lose weight. A healthy amount of weight to lose per week is usually 1-2 lb (0.5-0.9 kg). This usually means reducing your daily calorie intake by 500-750 calories. The number of calories in a food can be found on a Nutrition Facts label. If a food does not have a Nutrition Facts label, try to look up the calories online or ask your dietitian for help. Use smaller plates, glasses, and bowls for smaller portions and to prevent overeating. Use your calories on foods and drinks that will fill you up and not leave you hungry shortly after a meal. This information is not intended to replace advice given to you by your health care provider. Make sure you discuss any questions you have with your health care provider. Document Revised: 10/03/2019 Document Reviewed: 10/03/2019 Elsevier Patient Education  [place].

## 2022-06-15 ENCOUNTER — Encounter: Payer: Self-pay | Admitting: Surgery

## 2022-06-15 NOTE — Progress Notes (Signed)
Outpatient Surgical Follow Up  06/15/2022  Megan Whitney is an 66 y.o. female.   Chief Complaint  Patient presents with   Follow-up    Hiatal hernia    HPI: Megan Whitney is a 66 y.o. female seen in F/U. She does have a long history of a paraesophageal hernia.  Most recently she did undergo EGD ( 02/2022) that I  have personally reviewed the images.  There is evidence of a large hiatal hernia with a stricture and Cameron ulcers.  Biopsies showed evidence of inflammation but no evidence of cancer. Endorses reflux worsening when she lays supine.  She also had some dysphagia that improved significantly after last dilation She Does have a history of bipolar disorder but is very well controlled.  No recent hospitalizations. She Did have a recent CBC that was completely normal with normal platelets and normal hemoglobin. She is Able to perform more than 4 METS of activity without any shortness of breath or chest pain. Also experiences some chronic cough and asthma-like symptoms. She Did have history of lung cancer and underwent resection several years ago.  She recovered well without any complications.  She is diabetic and has anxiety as well as bipolar disorder.  She Does have tardive dyskinesia She did as well as a barium swallow that I have  personally reviewed showing a large  paraesophageal hernia.  No evidence of strictures or fullness  Past Medical History:  Diagnosis Date   ADD (attention deficit disorder)    Allergy    Seasonal   Anemia    History of GI blood loss   Anxiety    Arthritis    Atrophy of vagina 10/07/2020   Bipolar 1 disorder (HCC)    Cancer (HCC)    Colon polyps    Depression    Diabetes mellitus ([place])    Edema, lower extremity    Epistaxis    Around 2011 or 2012, required cauterization.    Esophageal stricture    Fracture of superior pubic ramus (HCC) 11/28/2018   GERD (gastroesophageal reflux disease)    Headache(784.0)    Hyperlipidemia     Interstitial cystitis    Joint pain    Lactose intolerance    Lung cancer ([place]) 2002   Neuromuscular disorder ([place])    Obesity    Osteoarthritis    Palpitations    Sleep apnea    Doesn't use a CPAP   Suicidal ideation 01/20/2020   Swallowing difficulty    Tardive dyskinesia     Past Surgical History:  Procedure Laterality Date   BALLOON DILATION  05/16/2012   Procedure: BALLOON DILATION;  Surgeon: Inda Castle, MD;  Location: [facility];  Service: Endoscopy;  Laterality: N/A;   BUNIONECTOMY  2011   COLONOSCOPY     ENTEROSCOPY  05/16/2012   Procedure: ENTEROSCOPY;  Surgeon: Inda Castle, MD;  Location: [facility];  Service: Endoscopy;  Laterality: N/A;   JOINT REPLACEMENT     right shoulder durgery 25 yrs ago  [place]  2006, 2008   bilateral   TUBAL LIGATION  1990   WEDGE RESECTION  2002   lung cancer    Family History  Problem Relation Age of Onset   Arthritis Mother    Hearing loss Mother    Hyperlipidemia Mother    Hypertension Mother    Depression Mother    Anxiety disorder Mother    Obesity Mother    Sudden death Mother  Hypertension Father    Diabetes Mellitus II Father    Heart disease Father    Arthritis Father    Cancer Father        Brain   COPD Father    Diabetes Father    Hyperlipidemia Father    Sleep apnea Father    Early death Sister        Aneroxia/Bulimic   Depression Brother    Early death Proofreader Accident   Depression Daughter    Drug abuse Daughter    Heart disease Daughter    Hypertension Daughter    Stroke Maternal Grandmother    Hypertension Maternal Grandmother    Arthritis Maternal Grandfather    Heart attack Maternal Grandfather    Hearing loss Maternal Grandfather    Colon cancer Neg Hx    Esophageal cancer Neg Hx    Rectal cancer Neg Hx     Social History:  reports that she has never smoked. She has never used smokeless tobacco. She reports current alcohol use of about 1.0  standard drink of alcohol per week. She reports that she does not use drugs.  Allergies:  Allergies  Allergen Reactions   Azithromycin Anaphylaxis   Penicillins Anaphylaxis    DID THE REACTION INVOLVE: Swelling of the face/tongue/throat, SOB, or low BP? Yes Sudden or severe rash/hives, skin peeling, or the inside of the mouth or nose? Yes Did it require medical treatment? No When did it last happen?       If all above answers are "NO", may proceed with cephalosporin use.  Patient reacts to Z pack.  HAS Taken amoxicillin fine.   Adhesive [Tape] Other (See Comments)    On bandaids    Medications reviewed.    ROS Full ROS performed and is otherwise negative other than what is stated in HPI   BP 115/79   Pulse (!) 106   Temp 98.3 F (36.8 C) (Oral)   Wt 191 lb (86.6 kg)   SpO2 95%   BMI 37.30 kg/m   Physical Exam CONSTITUTIONAL: NAD BMI 37.5. EYES: Pupils are equal, round, Sclera are non-icteric. EARS, NOSE, MOUTH AND THROAT: The oral mucosa is pink and moist. Hearing is intact to voice. LYMPH NODES:  Lymph nodes in the neck are normal. RESPIRATORY:  Lungs are clear. There is normal respiratory effort, with equal breath sounds bilaterally, and without pathologic use of accessory muscles. CARDIOVASCULAR: Heart is regular without murmurs, gallops, or rubs. GI: The abdomen is  soft, nontender, and nondistended. There are no palpable masses. There is no hepatosplenomegaly. There are normal bowel sounds in all quadrants. GU: Rectal deferred.   MUSCULOSKELETAL: Normal muscle strength and tone. No cyanosis or edema.   SKIN: Turgor is good and there are no pathologic skin lesions or ulcers. NEUROLOGIC: Motor and sensation is grossly normal. Cranial nerves are grossly intact. PSYCH:  Oriented to person, place and time. Affect is normal.   Assessment/Plan: 66 year old female with a paraesophageal hernia that is large that is causing significant symptoms both from pulmonary  perspective and GI perspective to include strictures and Cameron's ulcers. I do definitely thinks that there is an indication to repair paraesophageal hernia.  Her BMI is 37.5 which is not ideal.  I had an extensive discussion with her about optimization of her weight before definitive surgical intervention to improve her outcomes. She is open for and is going to pursue weight loss with diet and exercise.  We will  see her back in a couple months and reevaluate for surgical intervention Please note that I have spent 40 minutes in this encounter including personally reviewing imaging studies, counseling the patient ,coordinating her care, placing orders and performing appropriate documentation.   Caroleen Hamman, MD [facility] General Surgeon

## 2022-06-16 ENCOUNTER — Telehealth: Payer: Self-pay

## 2022-06-16 NOTE — Telephone Encounter (Signed)
Pt contacted nurse to report that in the morning she feels worse, she is in a fog, drooling, can't understand what people are saying to her. She normally gets up around 9:30 am, eats breakfast then goes back to bed for awhile longer. She reports her right hand trembles all day. She remains in a fog till around noon. She reports having a bad manic episode last week, but she is doing better now. She just wanted Dr. Clovis Pu to be aware, not sure anything can be done.  Informed her I would update Dr. Clovis Pu and follow up with her.

## 2022-06-16 NOTE — Telephone Encounter (Signed)
Informed pt of Dr. Casimiro Needle recommendation.

## 2022-06-16 NOTE — Telephone Encounter (Signed)
Stop lithium and continue clozapine 75 mg HS

## 2022-06-20 ENCOUNTER — Encounter: Payer: Self-pay | Admitting: Internal Medicine

## 2022-06-21 ENCOUNTER — Telehealth: Payer: Self-pay

## 2022-06-21 DIAGNOSIS — Z79899 Other long term (current) drug therapy: Secondary | ICD-10-CM | POA: Diagnosis not present

## 2022-06-21 DIAGNOSIS — F319 Bipolar disorder, unspecified: Secondary | ICD-10-CM | POA: Diagnosis not present

## 2022-06-21 NOTE — Telephone Encounter (Signed)
Pt called to report she is having insomnia, having a hard time falling asleep and staying asleep. Asking if this is result of medication changes? Also reports she has started stooping forward, it happens when she is walking, standing, or sitting.

## 2022-06-22 ENCOUNTER — Encounter: Payer: Self-pay | Admitting: Psychiatry

## 2022-06-22 NOTE — Telephone Encounter (Signed)
We have few options remaining and she needs to take the clozapine long enough to get some benefit out of it.  Therefore we will attempt to reduce some of the other medications to improve the overall tolerability of the medicines. Stop the lithium Reduce lamotrigine to 1/2 tablet twice daily.   Continue clozapine 100 mg nightly

## 2022-06-23 ENCOUNTER — Ambulatory Visit: Payer: No Typology Code available for payment source | Admitting: Psychiatry

## 2022-06-23 DIAGNOSIS — F319 Bipolar disorder, unspecified: Secondary | ICD-10-CM | POA: Diagnosis not present

## 2022-06-23 NOTE — Telephone Encounter (Signed)
Notified patient.

## 2022-06-23 NOTE — Telephone Encounter (Signed)
Since she is complaining of SE will leave the clozapine dose the same at 75 mg for now while we evaluate the effect of reducing the lamotrigine.

## 2022-06-23 NOTE — Progress Notes (Signed)
[facility] Counselor/Therapist Progress Note  Patient ID: Megan Whitney, MRN: [id number],    Date: 06/23/2022  Time Spent: 55 minutes   Treatment Type: Individual Therapy  Reported Symptoms: anger, anxiety,depression, irritable, negative thoughts, overwhelmed, inner chaos, impulse control issues, tired, sad, denies any SI  Mental Status Exam:  Appearance:   Casual and Neat     Behavior:  Appropriate, Sharing, and Motivated  Motor:  Normal  Speech/Language:   Clear and Coherent  Affect:  Depressed and anxious, angry, tired, sad  Mood:  angry, anxious, depressed, irritable, and sad  Thought process:  goal directed  Thought content:    Obsessive thoughts  Sensory/Perceptual disturbances:    WNL  Orientation:  oriented to person, place, time/date, situation, day of week, month of year, year, and stated date of Oct. 19, 2023  Attention:  Good  Concentration:  Good  Memory:  Some short term memory  Fund of knowledge:   Good  Insight:    Good and Fair  Judgment:   Good and Fair  Impulse Control:  Good and Fair   Risk Assessment: Danger to Self:  No Self-injurious Behavior: No Danger to Others: No Duty to Warn:no Physical Aggression / Violence:No  Access to Firearms a concern: No  Gang Involvement:No   Subjective:  Patient in today reporting anxiety, anger, depression, irritability, sadness, negative thoughts, inner chaos, overwhelmed, and impulse control. Husband's concern issues continues and patient is worried that he is drinking and driving.  Has asked for his keys but husband refuses.Has started attending Al-Anon meetings weekly. Processed a the symptoms that she is dealing with as noted above which all are related to helping patient cope with situation with husband and her daily life which is  heavily impacted by husband's illness and heavy consumption of alcohol.  Discussed this specific ways she can take better care of herself including getting outside some and  walking, being in frequent contact with friends/family that are supportive, reflect on her strengths and some of the ways that she has made progress, actively participate in therapy, having good sleep patterns, and spending time with her 2 dogs that are very therapeutic for her.  Is concerned that when she shares her situation with friends or family that it may overwhelm them, and I encouraged her to check that out with them, so as to not continue worrying about it.  She does state that she has not gotten any negative feedback from any of them nor any indications that it was overwhelming for them.  Staying in contact with some of her church members.  Still some self blaming but has also made some progress there.  From what she reports, she has been more proactive, which is good for her.  Reminded to focus on the things that she can impact her control versus those things that are definitely out of her control.  Encouraged patient's pro activity and staying connected with others who are providing good support to her.  Interventions: Cognitive Behavioral Therapy and Ego-Supportive  Long Term Goal: Reduce overall level, frequency, and intensity of the anxiety so that daily functioning is not impaired. Short Term Goal: 1.Increase understanding of the beliefs and messages that produce the worry and anxiety. Strategies: 1.Help client develop reality-based positive cognitive messages/self-talk. 2. Develop a "coping card" or other reminder which coping strategies are recorded for patient's later use.   Diagnosis:   ICD-10-CM   1. Bipolar I disorder with rapid cycling ([place])  F31.9  Plan:  Patient in today showing some motivation and good participation as we focused on the various symptoms as noted above that she reported currently.  Noticed more insight today on her part and able to talk very freely about her concern for her husband but also her frustrations especially around his drinking.  Strongly  supported her getting involved with Al-Anon and attending there weekly meetings.  She to is finding this to be helpful.  Realizing her limits and experiencing some good support from others in supporting her more positive decision making.  No tearfulness today.  Better self-care.  Trying to look for more positives versus negatives.  Calls on supportive people as needed. Encourage patient in her focusing more on the positive behaviors we note in session including having more relief in herself and her abilities to better manage difficult and unpredictable circumstances, set limits with others who are not supportive, focus on making sound decisions rather than impulsive decisions, continued contact with supportive friends and family, use of journaling between sessions, taking breaks occasionally from her electronics, finding the strengths and positives within herself, stop assuming worst-case scenarios, get outside daily, spend time with her 2 dogs who are very therapeutic for her, positive self talk, and realize the strength she shows when working with goal-directed behaviors to move in a direction that supports her improved emotional health and overall wellbeing.  Goal review and progress/challenges noted with patient.  Next appointment within 2 to 3 weeks.  This record has been created using [employer].  Chart creation errors have been sought, but may not always have been located and corrected.  Such creation errors do not reflect on the standard of medical care provided.   Shanon Ace, LCSW

## 2022-06-23 NOTE — Telephone Encounter (Signed)
Called patient and she was here to see Debbie. Asked her to stop by my office after her appt.

## 2022-06-27 DIAGNOSIS — F319 Bipolar disorder, unspecified: Secondary | ICD-10-CM | POA: Diagnosis not present

## 2022-06-27 DIAGNOSIS — Z79899 Other long term (current) drug therapy: Secondary | ICD-10-CM | POA: Diagnosis not present

## 2022-06-28 ENCOUNTER — Other Ambulatory Visit: Payer: Self-pay | Admitting: Psychiatry

## 2022-06-28 ENCOUNTER — Encounter: Payer: Self-pay | Admitting: Psychiatry

## 2022-06-28 DIAGNOSIS — F319 Bipolar disorder, unspecified: Secondary | ICD-10-CM

## 2022-06-30 ENCOUNTER — Ambulatory Visit (HOSPITAL_COMMUNITY): Payer: No Typology Code available for payment source

## 2022-06-30 ENCOUNTER — Other Ambulatory Visit (HOSPITAL_COMMUNITY): Payer: No Typology Code available for payment source

## 2022-07-06 DIAGNOSIS — Z79899 Other long term (current) drug therapy: Secondary | ICD-10-CM | POA: Diagnosis not present

## 2022-07-06 DIAGNOSIS — F319 Bipolar disorder, unspecified: Secondary | ICD-10-CM | POA: Diagnosis not present

## 2022-07-07 ENCOUNTER — Encounter: Payer: Self-pay | Admitting: Psychiatry

## 2022-07-07 ENCOUNTER — Other Ambulatory Visit: Payer: Self-pay | Admitting: Psychiatry

## 2022-07-07 ENCOUNTER — Ambulatory Visit (INDEPENDENT_AMBULATORY_CARE_PROVIDER_SITE_OTHER): Payer: No Typology Code available for payment source | Admitting: Psychiatry

## 2022-07-07 VITALS — BP 109/74 | HR 111

## 2022-07-07 DIAGNOSIS — Z79899 Other long term (current) drug therapy: Secondary | ICD-10-CM

## 2022-07-07 DIAGNOSIS — F411 Generalized anxiety disorder: Secondary | ICD-10-CM | POA: Diagnosis not present

## 2022-07-07 DIAGNOSIS — F319 Bipolar disorder, unspecified: Secondary | ICD-10-CM | POA: Diagnosis not present

## 2022-07-07 DIAGNOSIS — G3184 Mild cognitive impairment, so stated: Secondary | ICD-10-CM | POA: Diagnosis not present

## 2022-07-07 DIAGNOSIS — G2401 Drug induced subacute dyskinesia: Secondary | ICD-10-CM

## 2022-07-07 DIAGNOSIS — R251 Tremor, unspecified: Secondary | ICD-10-CM

## 2022-07-07 DIAGNOSIS — F900 Attention-deficit hyperactivity disorder, predominantly inattentive type: Secondary | ICD-10-CM

## 2022-07-07 DIAGNOSIS — F5105 Insomnia due to other mental disorder: Secondary | ICD-10-CM

## 2022-07-07 NOTE — Progress Notes (Signed)
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 dizzy. SE: Still having mouth movements and biting tongue.  Sometimes hard to talk.  Drooling.  When tries to increase clozapine slurred speech and severe dizziness.   Mood is a little better.   Sleeping 8-9 hours.  So much better sleep with clozapine.   Still has anxiety, generalized. Plan: To minimize polypharmacy and improve tolerabilty:  Reduce Equetro to 1 of the 100 mg capsule at night for 1 week, then stop it. Wait 1 week then stop the carbamazepine chewable. Wait 1 more week then reduce clonidine to 1/2 at night for 1 week then stop it. Plan: Started clozapine  and continue 25 mg for now bc hasn't tolerated more so far.  02/08/22 appt noted: Mouth movements and biting tongue.  Sores on cheek with constant chewing movements. Sometimes hard to talk. Hypersalivation gets worse as day progresses. Sometimes balance problems.  Constipation managed.   Sleep very well.  8-9 hours. Off Equetro and on clozapine. Still has depression and anxiety without much change Plan: Started clozapine  and continue 25 mg for now bc  hasn't tolerated more and need to start Ingrezza 40 mg for TD.  03/23/2022 appointment with the following noted: Several phone calls since here.  Has gotten up to clozapine 37.5 mg nightly. Continues Focalin 15 mg twice daily, lamotrigine 200 mg twice daily, lithium 150 nightly. Started Ingrezza 40 mg daily. Ingrezza amazing difference but even with grant of $10000 can't afford it.  Not biting mouth and mouth less sore.  Less mouth movements but not gone Emotionally not real well with anxiety and depression and crying spells.  Also some irritability and anger.  Easily triggered anger. Sleep more broken with Ingrezza HS but 8-9 hours. Can be sedated if gets up early with slurred speech but not if full night sleep. Balance better off Equetro. Plan: Started clozapine but needs to increase bc minimal effect and better tolerance, so increase to 50 mg HS  04/05/22 appt noted: Increased clozapine to 50 mg HS.  Some groggy in the AM.  One day was dizzy.  Otherwise on occasion.  Takes it right before bed.   Still depression, hopeless, irritability and anger.  Some periods of racing thoughts.  Sometimes recognizes  hypomanic episodes and somethimes doesn't recognize. Dog is very sick and H with bone CA.  Not crying on clozapine as much. GERD and needs surgery for hiatal hernia. Signed up for water aerobics. Plan: Started clozapine but needs to increase bc minimal effect and better tolerance, so increase to 75 mg HS (Started clozapine on 12/07/21) Reduce librium 5 mg HS and plan to stop  05/10/22 appt med: TD partially better with Ingrezza 40.  Has  a grant.   Increased clozapine 75 mg HS, reduced Librium to 5 mg HS. Tolerated OK. Depression a little better.  Irritability still high.  Poor memory and easily confused. Sleep is pretty good and is better and needs to sleep longer.   Plan: DC librium Worsening TD partial response on 40 mg Ingrezza, increase to 60 mg daily   Continue clozapine 100 mg  HS  05/17/2022 phone call complaining of sleeping more and feeling sleepy and foggy thinking also dropping some things and drooling.  She was instructed to reduce the clozapine to 75 mg nightly to see if that was the problem. 05/23/2022 phone call asking to increase Ingrezza from 60 to 80 mg daily.  It was agreed. 06/16/2022 phone call stating she had a bad manic episode the week prior and is still feeling excessively sedated.  Also having hand tremors. Instructed to stop lithium and continue clozapine 75 mg nightly.  She is very med sensitive but we have few options left to treat her unstable bipolar disorder. 06/21/2022 phone call: After complaining of excessive sleepiness and excessive sleeping she is now complaining of insomnia.  07/07/22 appt noted: Current psychiatric medications include clozapine 75 mg nightly, Focalin 15 mg twice daily, lamotrigine 100 mg twice daily Ingrezza 80 mg daily.  She stopped lithium Has a list of concerns: SE drooling bad.   Still have mouth movements with the increase Ingrezza 80 mg daily but has stopped the tongue chewing. Goes to bed 9 PM and to sleep in 30 mins and awaken in the AM about 830 and hard to wake up.  Not napping in the day.  Getting enough sleep.   Notices Focalin kicking in when takes it. Mood depressed but not as bad.  Still some irritability, anger.  3 week ago bad manic anger lasting 3-4 days.  Not sure how her sleep was at the time. Some crying and poor impulse control.   PCP wanted 2nd opinion from neuro on ? PD, Dr. Carles Collet Nov 15.   Past Psychiatric Medication Trials: Vraylar 4.5 SE mouth movements reduced to 3 mg 3/20. It was effective at lower doses for depression.  Worse off it.  Vraylar 1.5 mg every third day led to relapse of significant depression. Caplyta SE at 42 mg .  Cost problems Latuda 80, , olanzapine, Seroquel, risperidone, Abilify, loxapine 25 mg BID (max tolerated) NR, Clozapine 50  lithium 150,   Trileptal 450,  Depakote, Equetro 300 hx balance issues, CBZ ER falling,   Lamictal 200 twice daily,  Focalin,  Ritalin,  fluoxetine 60,  sertraline 100, Wellbutrin history of facial tics, paroxetine cognitive side effects, Lexapro 10 worse buspirone,  ropinirole, amantadine, Sinemet, Artane, Cogentin,  pramipexole 0.5 mg BID helped tremor but caused anger trazodone hangover, Ambien hangover,  Review of Systems:  Review of Systems  HENT:  Positive for dental problem and tinnitus.        Chirping cricket sounds in hears since January 2023 drooling  Cardiovascular:  Negative for palpitations.  Gastrointestinal:  Positive for  abdominal pain. Negative for nausea.       GERD awakening her  Musculoskeletal:  Positive for arthralgias and gait problem.  Neurological:  Positive for dizziness. Negative for tremors and seizures.       Ankle problems and balance problems. No falls lately. Occ stumbles. Tremor is better in hands Mouth movements  Psychiatric/Behavioral:  Positive for decreased concentration and dysphoric mood. Negative for agitation, behavioral problems, confusion, hallucinations, self-injury, sleep disturbance and suicidal ideas. The patient is nervous/anxious. The patient is not hyperactive.   No falls since here. Not currently depressed but unable to remove this from the list.  Medications: I have reviewed the patient's current medications.  Current Outpatient Medications  Medication Sig Dispense Refill   acetaminophen (TYLENOL) 650 MG CR tablet Take 1,300 mg by mouth as needed for pain.     atorvastatin (LIPITOR) 20 MG tablet TAKE (1) TABLET BY MOUTH AT BEDTIME. 90 tablet 0   clozapine (CLOZARIL) 50 MG tablet TAKE 1 1/2 TABLETS BY MOUTH ONCE DAILY. 11 tablet 0   dexmethylphenidate (FOCALIN) 10 MG tablet TAKE 1 AND 1/2 TABLETS BY MOUTH TWICE DAILY. 90 tablet 0   ketoconazole (NIZORAL) 2 % cream Apply 1 Application topically daily. 30 g 0   lamoTRIgine (LAMICTAL) 200 MG tablet TAKE (1)  TABLET BY MOUTH TWICE DAILY. (Patient taking differently: 1/2 BID) 180 tablet 0   Melatonin 10 MG CAPS Take by mouth at bedtime as needed.     pantoprazole (PROTONIX) 40 MG tablet Take 1 tablet (40 mg total) by mouth 2 (two) times daily. 180 tablet 3   valbenazine (INGREZZA) 80 MG capsule Take 1 capsule (80 mg total) by mouth daily. 30 capsule 2   lithium carbonate 150 MG capsule TAKE (1) CAPSULE BY MOUTH AT BEDTIME. (Patient not taking: Reported on 07/07/2022) 90 capsule 0   No current facility-administered medications for this visit.    Medication Side Effects: Other: tremor and weight gain.   Dyskinesia appears better  SE bettter than they were.  Balance problems intermittently  Allergies:  Allergies  Allergen Reactions   Azithromycin Anaphylaxis   Penicillins Anaphylaxis    DID THE REACTION INVOLVE: Swelling of the face/tongue/throat, SOB, or low BP? Yes Sudden or severe rash/hives, skin peeling, or the inside of the mouth or nose? Yes Did it require medical treatment? No When did it last happen?       If all above answers are "NO", may proceed with cephalosporin use.  Patient reacts to Z pack.  HAS Taken amoxicillin fine.   Adhesive [Tape] Other (See Comments)    On bandaids    Past Medical History:  Diagnosis Date   ADD (attention deficit disorder)    Allergy    Seasonal   Anemia    History of GI blood loss   Anxiety    Arthritis    Atrophy of vagina 10/07/2020   Bipolar 1 disorder (HCC)    Cancer (HCC)    Colon polyps    Depression    Diabetes mellitus ([place])    Edema, lower extremity    Epistaxis    Around 2011 or 2012, required cauterization.    Esophageal stricture    Fracture of superior pubic ramus (HCC) 11/28/2018   GERD (gastroesophageal reflux disease)    Headache(784.0)    Hyperlipidemia    Interstitial cystitis    Joint pain    Lactose intolerance    Lung cancer ([place]) 2002   Neuromuscular disorder ([place])    Obesity  Osteoarthritis     Palpitations    Sleep apnea    Doesn't use a CPAP   Suicidal ideation 01/20/2020   Swallowing difficulty    Tardive dyskinesia     Family History  Problem Relation Age of Onset   Arthritis Mother    Hearing loss Mother    Hyperlipidemia Mother    Hypertension Mother    Depression Mother    Anxiety disorder Mother    Obesity Mother    Sudden death Mother    Hypertension Father    Diabetes Mellitus II Father    Heart disease Father    Arthritis Father    Cancer Father        Brain   COPD Father    Diabetes Father    Hyperlipidemia Father    Sleep apnea Father    Early death Sister        Aneroxia/Bulimic   Depression Brother    Early death Proofreader Accident   Depression Daughter    Drug abuse Daughter    Heart disease Daughter    Hypertension Daughter    Stroke Maternal Grandmother    Hypertension Maternal Grandmother    Arthritis Maternal Grandfather    Heart attack Maternal Grandfather    Hearing loss Maternal Grandfather    Colon cancer Neg Hx    Esophageal cancer Neg Hx    Rectal cancer Neg Hx     Social History   Socioeconomic History   Marital status: Married    Spouse name: Not on file   Number of children: 1   Years of education: Not on file   Highest education level: Not on file  Occupational History   Occupation: Admin. assistant  Tobacco Use   Smoking status: Never   Smokeless tobacco: Never  Vaping Use   Vaping Use: Never used  Substance and Sexual Activity   Alcohol use: Yes    Alcohol/week: 1.0 standard drink of alcohol    Types: 1 Glasses of wine per week    Comment: Moderate   Drug use: No   Sexual activity: Yes  Other Topics Concern   Not on file  Social History Narrative   Pt lives in [place] with husband Lanny Hurst.  Followed by Dr. Clovis Pu for psychiatry and Rinaldo Cloud for therapy.   Social Determinants of Health   Financial Resource Strain: Low Risk  (05/11/2022)   Overall Financial Resource Strain (CARDIA)     Difficulty of Paying Living Expenses: Not hard at all  Food Insecurity: No Food Insecurity (05/11/2022)   Hunger Vital Sign    Worried About Running Out of Food in the Last Year: Never true    Ran Out of Food in the Last Year: Never true  Transportation Needs: No Transportation Needs (05/11/2022)   PRAPARE - Hydrologist (Medical): No    Lack of Transportation (Non-Medical): No  Physical Activity: Inactive (05/11/2022)   Exercise Vital Sign    Days of Exercise per Week: 0 days    Minutes of Exercise per Session: 0 min  Stress: No Stress Concern Present (05/09/2021)   [place]    Feeling of Stress : Only a little  Social Connections: Moderately Integrated (05/11/2022)   Social Connection and Isolation Panel [NHANES]    Frequency of Communication with Friends and Family: More than three times a week    Frequency of Social Gatherings  with Friends and Family: More than three times a week    Attends Religious Services: More than 4 times per year    Active Member of Clubs or Organizations: No    Attends Archivist Meetings: Never    Marital Status: Married  Human resources officer Violence: Not At Risk (05/09/2021)   Humiliation, Afraid, Rape, and Kick questionnaire    Fear of Current or Ex-Partner: No    Emotionally Abused: No    Physically Abused: No    Sexually Abused: No    Past Medical History, Surgical history, Social history, and Family history were reviewed and updated as appropriate.   Please see review of systems for further details on the patient's review from today.   Objective:   Physical Exam:  BP 109/74   Pulse (!) 111   Physical Exam Constitutional:      General: She is not in acute distress. Musculoskeletal:        General: No deformity.  Neurological:     Mental Status: She is alert and oriented to person, place, and time.     Cranial Nerves: No dysarthria.     Motor:  Tremor present.     Coordination: Coordination normal.     Comments: Mild resting R hand rotational tremor better but not gone. Fidgety mildy with feet better Some buccolingual tremor is better  Psychiatric:        Attention and Perception: Attention and perception normal. She does not perceive auditory hallucinations.        Mood and Affect: Mood is anxious and depressed. Affect is not labile, blunt, angry or inappropriate.        Speech: Speech normal. Speech is not rapid and pressured.        Behavior: Behavior normal. Behavior is not agitated. Behavior is cooperative.        Thought Content: Thought content normal. Thought content is not paranoid or delusional. Thought content does not include homicidal or suicidal ideation. Thought content does not include suicidal plan.        Cognition and Memory: Cognition and memory normal.        Judgment: Judgment normal.     Comments: Insight intact more mood cycling and still anxious. Still anxious Overall mood needs more improvement but depression better with clozapine but not resolved      Lab Review:     Component Value Date/Time   NA 138 05/25/2022 1319   NA 141 03/18/2022 1021   K 4.0 05/25/2022 1319   CL 107 05/25/2022 1319   CO2 22 05/25/2022 1319   GLUCOSE 112 (H) 05/25/2022 1319   BUN 17 05/25/2022 1319   BUN 10 03/18/2022 1021   CREATININE 0.83 05/25/2022 1319   CALCIUM 9.3 05/25/2022 1319   PROT 6.8 03/18/2022 1021   ALBUMIN 4.3 03/18/2022 1021   AST 17 03/18/2022 1021   ALT 10 03/18/2022 1021   ALKPHOS 83 03/18/2022 1021   BILITOT 0.5 03/18/2022 1021   GFRNONAA >60 05/25/2022 1319   GFRAA 96 03/02/2020 1433       Component Value Date/Time   WBC 6.3 03/18/2022 1021   WBC 7.4 01/19/2020 1158   RBC 4.50 03/18/2022 1021   RBC 4.19 01/19/2020 1158   HGB 13.0 03/18/2022 1021   HCT 40.8 03/18/2022 1021   PLT 261 03/18/2022 1021   MCV 91 03/18/2022 1021   MCH 28.9 03/18/2022 1021   MCH 30.3 01/19/2020 1158    MCHC 31.9 03/18/2022 1021  MCHC 32.1 01/19/2020 1158   RDW 12.8 03/18/2022 1021   LYMPHSABS 2.1 03/18/2022 1021   MONOABS 0.7 04/04/2019 1004   EOSABS 0.4 03/18/2022 1021   BASOSABS 0.0 03/18/2022 1021  Vitamin D level 33 on 10K units daily on 12/4/`9 Increased to prescription vitamin d 50K units Monday, Wed, Friday.  Rx sent in.   Lithium Lvl  Date Value Ref Range Status  10/21/2018 0.18 (L) 0.60 - 1.20 mmol/L Final    Comment:    Performed at [facility], [address], [place], [place] [address]     No results found for: "PHENYTOIN", "PHENOBARB", "VALPROATE", "CBMZ"   .res Assessment: Plan:    Bipolar I disorder with rapid cycling ([place])  Generalized anxiety disorder  Tardive dyskinesia  Attention deficit hyperactivity disorder (ADHD), predominantly inattentive type  Insomnia due to mental condition  Mild cognitive impairment  Long term current use of clozapine  Tremor of both hands  Greater than 50% of 45 min face to face time with patient was spent on counseling and coordination of care. We discussed multiple dxes and concerns.   [place] has chronic rapid cycling bipolar disorder which is chronically unstable and has been difficult to control.  Failed 14 different mood stabilizers.  The rapid cycling is making it difficult to control frequency of depressive episodes and the anxiety as well.  We have typically had to make frequent med changes.   Disc gradual increase bc med sensitivity.  Med sensitivity to SE seems to be the biggest problem preventing better mood control  No med changes with pending neuro appt.  (Started clozapine on 12/07/21).  Went up and then back down to 75 mg HS Having SE drooling and mild sedation.  Needs higher dose when it can be increased but defer change with pending neuro eval  Ingrezza 40 mg for TD helped stop tongue chewing but still some mouth movements at 80 mg, which was started 05/23/22. No change today pending neuro eval.  Disc ECT.  Only FDA approved option left. However she decided to pursue clozapine and is up to 25 mg for 1 night. Extensive discussion of clozapine dosing recommendations but we will increase more slowly bc she is so med sensitive.Disc risk low WBC, cardiomyopathy, etc, sedation Disc weekly labs etc and REMS program.    Prone to UTI and may be causing confusion discussed.  Had confirmed UTI recently.  She has a high residual anxiety.  It has been impossible to control all of her symptoms simultaneously without causing side effects. Failed various meds.  Discussed side effects of each medicine. Continue   Focalin IR 15 mg BID DT Cost and off label for depression  Try to spread this out if possible for mood.  She feels this is necessary  Lamotrigine reduced to  100 mg twice daily to try to reduce polypharmacy and so improve tolerability of clozapine.  consider reduction if clozapine helps.  Tremor better off low dose lithium Failed all reasonable alternatives for anxiety.  Option viibryd  Discussed potential metabolic side effects associated with atypical antipsychotics, as well as potential risk for movement side effects. Advised pt to contact office if movement side effects occur.    Checked B12 folate bc memory complaints.  Normal B12 & folate on 05/25/22  Disc SE meds and this is heightened by the complication of necessary polypharmacy.  Supportive therapy in terms of dealing with husband's addiction and now new dx metastatic prostate CA.Marland Kitchen  Requires frequent FU DT chronic instability.  Wants to schedule monthly.  FU 4 weeks. Call if not better in 4 weeks and hope to increase clozapine  Lynder Parents MD, DFAPA  Please see After Visit Summary for patient specific instructions.    Future Appointments  Date Time Provider [place]  07/08/2022  1:00 PM AP-DG DEXA AP-DG [place] [facility]  07/08/2022  1:30 PM AP-MM 1 AP-MM [place] [facility]  07/14/2022 10:00 AM Shanon Ace, LCSW CP-CP None   07/20/2022 10:15 AM Tat, Eustace Quail, DO LBN-LBNG None  08/01/2022 11:00 AM Shanon Ace, LCSW CP-CP None  08/08/2022 10:30 AM Cottle, Billey Co., MD CP-CP None  08/15/2022  1:30 PM Jules Husbands, MD AS-AS None  08/22/2022 10:00 AM Shanon Ace, LCSW CP-CP None  09/08/2022 10:30 AM Cottle, Billey Co., MD CP-CP None  09/12/2022 10:00 AM Shanon Ace, LCSW CP-CP None  09/22/2022  1:40 PM Lindell Spar, MD RPC-RPC RPC  10/03/2022 10:00 AM Shanon Ace, LCSW CP-CP None    No orders of the defined types were placed in this encounter.      -------------------------------

## 2022-07-08 ENCOUNTER — Ambulatory Visit (HOSPITAL_COMMUNITY)
Admission: RE | Admit: 2022-07-08 | Discharge: 2022-07-08 | Disposition: A | Discharge status: Home / Self Care | Payer: No Typology Code available for payment source | Source: Ambulatory Visit | Attending: Internal Medicine | Admitting: Internal Medicine

## 2022-07-08 ENCOUNTER — Encounter: Payer: Self-pay | Admitting: Internal Medicine

## 2022-07-08 DIAGNOSIS — Z780 Asymptomatic menopausal state: Secondary | ICD-10-CM | POA: Insufficient documentation

## 2022-07-08 DIAGNOSIS — Z1231 Encounter for screening mammogram for malignant neoplasm of breast: Secondary | ICD-10-CM | POA: Insufficient documentation

## 2022-07-08 DIAGNOSIS — M8589 Other specified disorders of bone density and structure, multiple sites: Secondary | ICD-10-CM | POA: Diagnosis not present

## 2022-07-11 ENCOUNTER — Encounter: Payer: Self-pay | Admitting: Internal Medicine

## 2022-07-11 NOTE — Telephone Encounter (Signed)
Patient aware of results and will keep appointment on 07/13/22 to ask further questions

## 2022-07-12 DIAGNOSIS — F319 Bipolar disorder, unspecified: Secondary | ICD-10-CM | POA: Diagnosis not present

## 2022-07-12 DIAGNOSIS — Z79899 Other long term (current) drug therapy: Secondary | ICD-10-CM | POA: Diagnosis not present

## 2022-07-13 ENCOUNTER — Encounter: Payer: Self-pay | Admitting: Internal Medicine

## 2022-07-13 ENCOUNTER — Ambulatory Visit (INDEPENDENT_AMBULATORY_CARE_PROVIDER_SITE_OTHER): Payer: No Typology Code available for payment source | Admitting: Internal Medicine

## 2022-07-13 ENCOUNTER — Other Ambulatory Visit: Payer: Self-pay | Admitting: Internal Medicine

## 2022-07-13 ENCOUNTER — Encounter: Payer: Self-pay | Admitting: Psychiatry

## 2022-07-13 DIAGNOSIS — L304 Erythema intertrigo: Secondary | ICD-10-CM | POA: Diagnosis not present

## 2022-07-13 DIAGNOSIS — M8580 Other specified disorders of bone density and structure, unspecified site: Secondary | ICD-10-CM

## 2022-07-13 DIAGNOSIS — E7849 Other hyperlipidemia: Secondary | ICD-10-CM

## 2022-07-13 MED ORDER — KETOCONAZOLE 2 % EX CREA: 1.0000 | TOPICAL_CREAM | Freq: Every day | CUTANEOUS | 0 refills | Status: DC

## 2022-07-13 NOTE — Assessment & Plan Note (Signed)
Advised to take Calcium and Vitamin D supplements Reviewed and discussed DEXA scan, answered her concerns

## 2022-07-13 NOTE — Progress Notes (Signed)
Virtual Visit via Telephone Note   This visit type was conducted via telephone. This format is felt to be most appropriate for this patient at this time.  The patient did not have access to video technology/had technical difficulties with video requiring transitioning to audio format only (telephone).  All issues noted in this document were discussed and addressed.  No physical exam could be performed with this format.  Evaluation Performed:  Follow-up visit  Date:  07/13/2022   ID:  Megan Whitney, Megan Whitney 29-Jul-1956, MRN [id number]  Patient Location: Home Provider Location: Office/Clinic  Participants: Patient Location of Patient: Home Location of Provider: Telehealth Consent was obtain for visit to be over via telehealth. I verified that I am speaking with the correct person using two identifiers.  PCP:  Lindell Spar, MD   Chief Complaint: Discuss DEXA scan  History of Present Illness:    Megan Whitney is a 66 y.o. female who has a televisit for discussing DEXA scan.  She recently had DEXA scan, which showed osteopenia.  She was concerned about the history mentioned in the report about her previous fracture.  I explained to her that it was the history part and not acute fracture and DEXA scan is not diagnostic test for acute fracture.  She has started taking Caltrate plus D3 twice daily now.  She also reports rash in the abdominal fold area, for which she has tried ketoconazole cream.  She requests a refill of it.  The patient does not have symptoms concerning for COVID-19 infection (fever, chills, cough, or new shortness of breath).   Past Medical, Surgical, Social History, Allergies, and Medications have been Reviewed.  Past Medical History:  Diagnosis Date   ADD (attention deficit disorder)    Allergy    Seasonal   Anemia    History of GI blood loss   Anxiety    Arthritis    Atrophy of vagina 10/07/2020   Bipolar 1 disorder (HCC)    Cancer (HCC)    Colon  polyps    Depression    Diabetes mellitus ([place])    Edema, lower extremity    Epistaxis    Around 2011 or 2012, required cauterization.    Esophageal stricture    Fracture of superior pubic ramus (HCC) 11/28/2018   GERD (gastroesophageal reflux disease)    Headache(784.0)    Hyperlipidemia    Interstitial cystitis    Joint pain    Lactose intolerance    Lung cancer ([place]) 2002   Neuromuscular disorder ([place])    Obesity    Osteoarthritis    Palpitations    Sleep apnea    Doesn't use a CPAP   Suicidal ideation 01/20/2020   Swallowing difficulty    Tardive dyskinesia    Past Surgical History:  Procedure Laterality Date   BALLOON DILATION  05/16/2012   Procedure: BALLOON DILATION;  Surgeon: Inda Castle, MD;  Location: [facility];  Service: Endoscopy;  Laterality: N/A;   BUNIONECTOMY  2011   COLONOSCOPY     ENTEROSCOPY  05/16/2012   Procedure: ENTEROSCOPY;  Surgeon: Inda Castle, MD;  Location: [facility];  Service: Endoscopy;  Laterality: N/A;   JOINT REPLACEMENT     right shoulder durgery 25 yrs ago  [place]  2006, 2008   bilateral   [place] RESECTION  2002   lung cancer     Current Meds  Medication Sig  acetaminophen (TYLENOL) 650 MG CR tablet Take 1,300 mg by mouth as needed for pain.   atorvastatin (LIPITOR) 20 MG tablet TAKE (1) TABLET BY MOUTH AT BEDTIME.   clozapine (CLOZARIL) 50 MG tablet TAKE 1 1/2 TABLETS BY MOUTH ONCE DAILY.   dexmethylphenidate (FOCALIN) 10 MG tablet TAKE 1 AND 1/2 TABLETS BY MOUTH TWICE DAILY.   lamoTRIgine (LAMICTAL) 200 MG tablet TAKE (1) TABLET BY MOUTH TWICE DAILY. (Patient taking differently: 1/2 BID)   Melatonin 10 MG CAPS Take by mouth at bedtime as needed.   pantoprazole (PROTONIX) 40 MG tablet Take 1 tablet (40 mg total) by mouth 2 (two) times daily.   valbenazine (INGREZZA) 80 MG capsule Take 1 capsule (80 mg total) by mouth daily.   [DISCONTINUED] ketoconazole (NIZORAL) 2 % cream  Apply 1 Application topically daily.     Allergies:   Azithromycin, Penicillins, and Adhesive [tape]   ROS:   Please see the history of present illness.     All other systems reviewed and are negative.   Labs/Other Tests and Data Reviewed:    Recent Labs: 03/18/2022: ALT 10; Hemoglobin 13.0; Platelets 261; TSH 2.020 05/25/2022: BUN 17; Creatinine, Ser 0.83; Potassium 4.0; Sodium 138   Recent Lipid Panel Lab Results  Component Value Date/Time   CHOL 202 (H) 03/18/2022 10:21 AM   TRIG 63 03/18/2022 10:21 AM   HDL 84 03/18/2022 10:21 AM   CHOLHDL 2.4 03/18/2022 10:21 AM   CHOLHDL 3 07/22/2020 01:38 PM   LDLCALC 107 (H) 03/18/2022 10:21 AM    Wt Readings from Last 3 Encounters:  06/13/22 191 lb (86.6 kg)  05/23/22 192 lb (87.1 kg)  05/19/22 192 lb 6.4 oz (87.3 kg)     ASSESSMENT & PLAN:    Osteopenia Advised to take Calcium and Vitamin D supplements Reviewed and discussed DEXA scan, answered her concerns  Intertrigo Ketoconazole cream prescribed Advised to keep area clean and dry   Time:   Today, I have spent 9 minutes reviewing the chart, including problem list, medications, and with the patient with telehealth technology discussing the above problems.   Medication Adjustments/Labs and Tests Ordered: Current medicines are reviewed at length with the patient today.  Concerns regarding medicines are outlined above.   Tests Ordered: No orders of the defined types were placed in this encounter.   Medication Changes: Meds ordered this encounter  Medications   ketoconazole (NIZORAL) 2 % cream    Sig: Apply 1 Application topically daily.    Dispense:  60 g    Refill:  0     Note: This dictation was prepared with Dragon dictation along with smaller phrase technology. Similar sounding words can be transcribed inadequately or may not be corrected upon review. Any transcriptional errors that result from this process are unintentional.      Disposition:  Follow up   Signed, Lindell Spar, MD  07/13/2022 9:09 AM     [place]

## 2022-07-13 NOTE — Patient Instructions (Signed)
Please continue taking Caltrate 600 plus D3 twice daily.  Please apply ketoconazole cream in the abdominal folds area.  Keep area clean and dry.

## 2022-07-13 NOTE — Assessment & Plan Note (Signed)
Ketoconazole cream prescribed Advised to keep area clean and dry

## 2022-07-14 ENCOUNTER — Ambulatory Visit (INDEPENDENT_AMBULATORY_CARE_PROVIDER_SITE_OTHER): Payer: No Typology Code available for payment source | Admitting: Psychiatry

## 2022-07-14 ENCOUNTER — Encounter: Payer: Self-pay | Admitting: Psychiatry

## 2022-07-14 DIAGNOSIS — F319 Bipolar disorder, unspecified: Secondary | ICD-10-CM | POA: Diagnosis not present

## 2022-07-14 NOTE — Progress Notes (Signed)
[facility] Counselor/Therapist Progress Note  Patient ID: Megan Whitney, MRN: [id number],    Date: 07/14/2022  Time Spent: 55 minutes   Treatment Type: Individual Therapy  Reported Symptoms: anxiety, anger, impatient, depression "decreased", "pissed off at the work in general"  Mental Status Exam:  Appearance:   Casual     Behavior:  Appropriate, Sharing, and Motivated  Motor:  Normal  Speech/Language:   Clear and Coherent  Affect:  Depressed and anxious  Mood:  anxious and depressed  Thought process:  goal directed  Thought content:    Rumination  Sensory/Perceptual disturbances:    WNL  Orientation:  oriented to person, place, time/date, situation, day of week, month of year, year, and stated date of Nov. 9, 2023  Attention:  Fair  Concentration:  Good and Fair  Memory:  Some short term memory and states her PCP is aware  Fund of knowledge:   Good  Insight:    Good and Fair  Judgment:   Good and Fair  Impulse Control:  Good and Fair   Risk Assessment: Danger to Self:  No Self-injurious Behavior: No Danger to Others: No Duty to Warn:no Physical Aggression / Violence:No  Access to Firearms a concern: No  Gang Involvement:No   Subjective:   Not been making good choices in relationships.  Husband still drinking and driving  and patient has stated she is taking his keys. Is continuing to attend Al-Anon weekly. Husband's drinking increases her anxiety and frustrations. Focused with patient today on her own self-care, using strategies to better manage her anger, impatience, depression, and irritability. Has started weight-watchers again "in person and online". "Trying to work with my bipolar but often don't  recognize it til later when I may have already taken over me." Wanting to try interrupt it more and "be more self calming" as her daily life continues to be heavily impacted by husband's illness and ongoing alcoholism. States it helps to come in and talk and calm  down, get suggestions/support/reassurance "and not having to guard what I say." Has "1 best friend and we are in touch almost daily", and some other friends through her knitting group and church. Focused on self-care including: Spending quality time with her 2 dogs that are very therapeutic for her, getting good sleep, reflecting more on positives versus negatives and especially on her own strengths, being in contact with supportive friends/family, walking outside some as she is able, and looking more for the positives versus negatives.  Interventions: Cognitive Behavioral Therapy and Solution-Oriented/Positive Psychology  Long Term Goal: Reduce overall level, frequency, and intensity of the anxiety so that daily functioning is not impaired. Short Term Goal: 1.Increase understanding of the beliefs and messages that produce the worry and anxiety. Strategies: 1.Help client develop reality-based positive cognitive messages/self-talk. 2. Develop a "coping card" or other reminder which coping strategies are recorded for patient's later use.   Diagnosis:   ICD-10-CM   1. Bipolar I disorder with rapid cycling ([place])  F31.9      Plan:  Patient in today working on her anxiety, impatience, anger, and feelings of just being "pissed off at the world in general".  She adds that the last feeling of being "pissed off of the world, she feels is related to recent frustrations with alcoholic husband who is also quite medically ill but continues to drink despite doctors recommendations not to drink.  Realizes this is out of her control but is very frustrating for her and working  to find more ways to "manage her own feelings" versus letting them manage her.  Does feel that it is helpful to attend Al-Anon weekly and tries to use strategies discussed here in sessions as well as an Al-Anon meetings to help her in managing her own symptoms plus her living with an alcoholic husband that refuses to follow medical advice.   Feels that she is noticing more insight on her part in reference especially to "what I can control versus what I cannot control".  Also realizing she is limited in her ability to change her husband's behavior and does appreciate the support of others as she tries to make more positive decisions for herself.  Exhibits no tearfulness today.  Reports improved self-care and looking more for the positives versus the negatives.  Showing more perseverance and states that she does call people as needed for support. Encouraged patient and her focus and practice of more positive behaviors as we noted in session including having more belief in herself and her abilities to better manage difficult and unpredictable circumstances, setting limits with others who are not supportive, focus on making sound decisions rather than impulsive decisions, continued contact with supportive friends and family, use of journaling between sessions, taking breaks occasionally from her electronics, finding the strengths and positives within herself, stop assuming worst-case scenarios, getting outside daily, spending time with her 2 dogs who are very therapeutic for her, positive self talk, and recognize the strength she shows when working with goal-directed behaviors to move in a direction that supports her improved emotional health.  Goal review and progress/challenges noted with patient.  Next appointment within 2 to 3 weeks.  This record has been created using [employer].  Chart creation errors have been sought, but may not always have been located and corrected.  Such creation errors do not reflect on the standard of medical care provided.   Shanon Ace, LCSW

## 2022-07-18 DIAGNOSIS — Z79899 Other long term (current) drug therapy: Secondary | ICD-10-CM | POA: Diagnosis not present

## 2022-07-18 DIAGNOSIS — F319 Bipolar disorder, unspecified: Secondary | ICD-10-CM | POA: Diagnosis not present

## 2022-07-18 NOTE — Progress Notes (Unsigned)
Assessment/Plan:   1.  Tardive dyskinesia -The patients symptoms are most consistent with tardive dyskinesia.  She has had exposure to typical and atypical antipsychotic medication.  TD is a heterogeneous syndrome depending on a subtle balance between several neurotransmitters in the brain, including DA receptor blockade and hypersensitivity of DA and GABA receptors. -pt on ingrezza since 02/2022 and both she and notes from Dr. Clovis Pu indicate great benefit.  2.  Tremor  -Largely resolved off of lithium and vraylar (vraylar d/c in 12/2021)  -She has very minor left hand tremor.  Did tell her that Ingrezza can occasionally cause parkinsonism, but I did not see a significant degree of that today.  -I did reassure her today that I saw no evidence of idiopathic Parkinson's disease.  She was reassured.  3.  Bipolar d/o  -difficult to control per records  -sees Dr. Clovis Pu frequently  4.  Discussed with patient that she really does not need neurologic follow-up at this point in time.  She was happy to hear this.  Subjective:   Megan Whitney was seen today in the movement disorders clinic for neurologic consultation at the request of Lindell Spar, MD.  The consultation is for the evaluation of "parkinsonism."  Patient previously under the care of of Dr. Merlene Laughter, whom is closing his practice.  Limited notes are made available and are reviewed.    She was diagnosed with Parkinson's disease and tardive dyskinesia.  Dr. Casimiro Needle notes are also reviewed at length.  Patient has had history of unstable and difficult to control bipolar disorder.  Numerous notes discussed this from Dr. Clovis Pu.  In March, 2022, the patient was seen by the PA at [facility] and they wanted to start either Austedo or Ingrezza.  They called Dr. Clovis Pu to discuss this.  Dr. Clovis Pu stated in his notes "we will defer any changes such as Austedo or Ingrezza because of the risk of worsening parkinsonism until the patient  is stable on Vraylar dosing."  Dr. Clovis Pu has made many medication changes since that time, and ultimately Arman Filter was discontinued in April, 2023.  Notes indicate tremor was better off of Vraylar.  She was started on clozapine.  In June, 2023 Dr. Casimiro Needle notes indicated that the patient had more mouth movements and tongue biting and she was started on Ingrezza.  Notes indicate that this made an "amazing difference but even with the grant of $10,000 cannot afford it."  Notes indicate, however, that she somehow stayed on this medication.  Lithium was discontinued in September, 2023.  She last saw Dr. Clovis Pu in November, 2023.  He seemed to question why she was coming here.  Tremor: mostly gone off of vraylar - states that it took 2-3 months to resolve - was predominantely the R hand   Tremor inducing meds:  Yes.  ,  Lithium (now off), Vraylar since at least 2021 - d/c in 12/2021; on loxapine in 2021/2022(previously on carbamazepine, Lamictal, latuda, abilify, librium, prozac, pramipexole,  for mood); focalin  -medications for tremor: amantadine (given by Dr. Clovis Pu in 2021 for 1 month but apparently had SE); pramipexole in 07/2020 by Dr. Clovis Pu for tremor/mood -felt mood swings on 0.5 mg bid; clonidine  Other Specific Symptoms:  Voice: no change Wet Pillows: Yes.   Postural symptoms:  Yes.    Falls?  No. Bradykinesia symptoms: shuffling gait and slow movements Loss of smell:  No. Loss of taste:  Yes.   Urinary Incontinence:  leakage for IC Difficulty  Swallowing:  No. Handwriting, micrographia: Yes.   Trouble with ADL's:  No.  Trouble buttoning clothing: No. Hallucinations:  No.  visual distortions: rarely N/V:  No. Lightheaded:  occasionally  Syncope: No. Diplopia:  occasionally but she can blink it away   ALLERGIES:   Allergies  Allergen Reactions   Azithromycin Anaphylaxis   Penicillins Anaphylaxis    DID THE REACTION INVOLVE: Swelling of the face/tongue/throat, SOB, or low BP?  Yes Sudden or severe rash/hives, skin peeling, or the inside of the mouth or nose? Yes Did it require medical treatment? No When did it last happen?       If all above answers are "NO", may proceed with cephalosporin use.  Patient reacts to Z pack.  HAS Taken amoxicillin fine.   Adhesive [Tape] Other (See Comments)    On bandaids    CURRENT MEDICATIONS:  Current Outpatient Medications  Medication Instructions   acetaminophen (TYLENOL) 1,300 mg, Oral, As needed   atorvastatin (LIPITOR) 20 MG tablet TAKE (1) TABLET BY MOUTH AT BEDTIME.   clozapine (CLOZARIL) 50 MG tablet TAKE 1 1/2 TABLETS BY MOUTH ONCE DAILY.   dexmethylphenidate (FOCALIN) 10 MG tablet TAKE 1 AND 1/2 TABLETS BY MOUTH TWICE DAILY.   Ferrous Gluconate (IRON 27 PO) Oral   ketoconazole (NIZORAL) 2 % cream 1 Application, Topical, Daily   lamoTRIgine (LAMICTAL) 200 MG tablet TAKE (1) TABLET BY MOUTH TWICE DAILY.   Melatonin 10 MG CAPS Oral, At bedtime PRN   pantoprazole (PROTONIX) 40 mg, Oral, 2 times daily   valbenazine (INGREZZA) 80 mg, Oral, Daily    Objective:   PHYSICAL EXAMINATION:    VITALS:   Vitals:   07/20/22 0951  BP: 122/80  Pulse: 100  Resp: 20  SpO2: 95%  Weight: 175 lb (79.4 kg)  Height: 5' (1.524 m)    GEN:  The patient appears stated age and is in NAD. HEENT:  Normocephalic, atraumatic.  The mucous membranes are moist. The superficial temporal arteries are without ropiness or tenderness. CV:  RRR Lungs:  CTAB Neck/HEME:  There are no carotid bruits bilaterally.  Neurological examination:  Orientation: The patient is alert and oriented x3.  Cranial nerves: There is good facial symmetry.  Extraocular muscles are intact. The visual fields are full to confrontational testing. The speech is fluent and clear. Soft palate rises symmetrically and there is no tongue deviation. Hearing is intact to conversational tone. Sensation: Sensation is intact to light touch throughout (facial, trunk,  extremities). Vibration is intact at the bilateral big toe. There is no extinction with double simultaneous stimulation.  Motor: Strength is 5/5 in the bilateral upper and lower extremities.   Shoulder shrug is equal and symmetric.  There is no pronator drift. Deep tendon reflexes: Deep tendon reflexes are 2-2+/4 at the bilateral biceps, triceps, brachioradialis, patella and achilles. Plantar responses are downgoing bilaterally.  Movement examination: Tone: There is normal tone in the bilateral upper extremities.  The tone in the lower extremities is normal .  Abnormal movements: there is feet dyskinesia; there is oral dyskinesia (tongue mostly in the mouth but moving).  Rare tremor in the LUE at rest but doesn't increase with distraction (actually gone during this time) Coordination:  There is no decremation with RAM's, with any form of RAMS, including alternating supination and pronation of the forearm, hand opening and closing, finger taps, heel taps and toe taps.  Gait and Station: The patient has no difficulty arising out of a deep-seated chair without the use of  the hands. The patient's stride length is good.  The patient has a neg pull test.     I have reviewed and interpreted the following labs independently   Chemistry      Component Value Date/Time   NA 138 05/25/2022 1319   NA 141 03/18/2022 1021   K 4.0 05/25/2022 1319   CL 107 05/25/2022 1319   CO2 22 05/25/2022 1319   BUN 17 05/25/2022 1319   BUN 10 03/18/2022 1021   CREATININE 0.83 05/25/2022 1319      Component Value Date/Time   CALCIUM 9.3 05/25/2022 1319   ALKPHOS 83 03/18/2022 1021   AST 17 03/18/2022 1021   ALT 10 03/18/2022 1021   BILITOT 0.5 03/18/2022 1021      Lab Results  Component Value Date   TSH 2.020 03/18/2022   Lab Results  Component Value Date   WBC 6.3 03/18/2022   HGB 13.0 03/18/2022   HCT 40.8 03/18/2022   MCV 91 03/18/2022   PLT 261 03/18/2022      Total time spent on today's visit was  45 minutes, including both face-to-face time and nonface-to-face time.  Time included that spent on review of records (prior notes available to me/labs/imaging if pertinent), discussing treatment and goals, answering patient's questions and coordinating care.  Cc:  Lindell Spar, MD

## 2022-07-19 ENCOUNTER — Encounter: Payer: Self-pay | Admitting: Psychiatry

## 2022-07-20 ENCOUNTER — Ambulatory Visit (INDEPENDENT_AMBULATORY_CARE_PROVIDER_SITE_OTHER): Payer: No Typology Code available for payment source | Admitting: Neurology

## 2022-07-20 ENCOUNTER — Encounter: Payer: Self-pay | Admitting: Neurology

## 2022-07-20 VITALS — BP 122/80 | HR 100 | Resp 20 | Ht 60.0 in | Wt 175.0 lb

## 2022-07-20 DIAGNOSIS — G2401 Drug induced subacute dyskinesia: Secondary | ICD-10-CM | POA: Diagnosis not present

## 2022-07-20 DIAGNOSIS — T43505A Adverse effect of unspecified antipsychotics and neuroleptics, initial encounter: Secondary | ICD-10-CM | POA: Diagnosis not present

## 2022-07-20 NOTE — Patient Instructions (Signed)
You look great today!  It was my pleasure to see you today!  The physicians and staff at [facility] are committed to providing excellent care. You may receive a survey requesting feedback about your experience at our office. We strive to receive "very good" responses to the survey questions. If you feel that your experience would prevent you from giving the office a "very good " response, please contact our office to try to remedy the situation. We may be reached at [phone]. Thank you for taking the time out of your busy day to complete the survey.

## 2022-07-23 ENCOUNTER — Other Ambulatory Visit: Payer: Self-pay | Admitting: Psychiatry

## 2022-07-23 DIAGNOSIS — F314 Bipolar disorder, current episode depressed, severe, without psychotic features: Secondary | ICD-10-CM

## 2022-07-25 ENCOUNTER — Other Ambulatory Visit: Payer: Self-pay

## 2022-07-25 DIAGNOSIS — Z79899 Other long term (current) drug therapy: Secondary | ICD-10-CM | POA: Diagnosis not present

## 2022-07-25 DIAGNOSIS — F319 Bipolar disorder, unspecified: Secondary | ICD-10-CM | POA: Diagnosis not present

## 2022-07-25 NOTE — Telephone Encounter (Signed)
Please send pt is out

## 2022-07-26 ENCOUNTER — Encounter: Payer: Self-pay | Admitting: Psychiatry

## 2022-07-27 ENCOUNTER — Other Ambulatory Visit: Payer: Self-pay

## 2022-07-27 DIAGNOSIS — G2401 Drug induced subacute dyskinesia: Secondary | ICD-10-CM

## 2022-07-27 MED ORDER — VALBENAZINE TOSYLATE 40 MG PO CAPS: 40.0000 mg | ORAL_CAPSULE | Freq: Two times a day (BID) | ORAL | 2 refills | Status: DC

## 2022-07-27 NOTE — Telephone Encounter (Signed)
Pt is advised to change her dosing with Ingrezza to 40 mg bid instead of 80 mg one time in the evening. Will update her Rx with [place]. Pt verbalized understanding.

## 2022-08-01 ENCOUNTER — Ambulatory Visit (INDEPENDENT_AMBULATORY_CARE_PROVIDER_SITE_OTHER): Payer: No Typology Code available for payment source | Admitting: Psychiatry

## 2022-08-01 ENCOUNTER — Telehealth: Payer: Self-pay

## 2022-08-01 DIAGNOSIS — F319 Bipolar disorder, unspecified: Secondary | ICD-10-CM | POA: Diagnosis not present

## 2022-08-01 DIAGNOSIS — Z79899 Other long term (current) drug therapy: Secondary | ICD-10-CM | POA: Diagnosis not present

## 2022-08-01 NOTE — Progress Notes (Signed)
[facility] Counselor/Therapist Progress Note  Patient ID: Megan Whitney, MRN: [id number],    Date: 08/01/2022  Time Spent: 55 minutes   Treatment Type: Individual Therapy  Reported Symptoms: anxiety, fearful, irritable, angry, some depression, confusion at times  Mental Status Exam:  Appearance:   Casual and Neat     Behavior:  Appropriate, Sharing, and Motivated  Motor:  Normal  Speech/Language:   Clear and Coherent  Affect:  anxious  Mood:  anxious, depressed, and irritable  Thought process:  goal directed  Thought content:    Rumination and some overthinking and obsessive thoughts  Sensory/Perceptual disturbances:    WNL  Orientation:  oriented to person, place, time/date, situation, day of week, month of year, year, and stated date of Nov. 27, 2023  Attention:  Fair  Concentration:  Fair  Memory:  "Sometimes forget but doctor tested me and said I didn't have memory issues."  Fund of knowledge:   Good  Insight:    Good and Fair  Judgment:   Good and Fair  Impulse Control:  Good and Fair   Risk Assessment: Danger to Self:  No Self-injurious Behavior: No Danger to Others: No Duty to Warn:no Physical Aggression / Violence:No  Access to Firearms a concern: No  Gang Involvement:No   Subjective:  Patient today reporting "anxiety, some depression, irritability and spend a lot of time worrying and being anxious, not enjoying things the way I used to." Worries about "husband and cancer and his drinking against doctor's orders", I worry about our house getting broken into and stealing", "I look a lot for the negatives." Insightful that she recognized her own "looking for the negatives vs positives. Worked primarily on her negative thought patterns and assumptions as she did well in catching herself and interrupting them but had difficulty replacing them with more positive or accurate thought patterns. To continue working on this between sessions and staying in touch with  supportive friends. Stressed importance of her own self-care and things that she finds comforting in times of higher stress. Trying to spend more time with her dogs and focus more on her strengths which she does recognize, allow for good sleep patterns, stay in contact with good friends. Is working part time with former employer for "about a week" and feels good about getting out around other people that I don't get to see very often.  Interventions: Cognitive Behavioral Therapy, Solution-Oriented/Positive Psychology, and Ego-Supportive  Long Term Goal: Reduce overall level, frequency, and intensity of the anxiety so that daily functioning is not impaired. Short Term Goal: 1.Increase understanding of the beliefs and messages that produce the worry and anxiety. Strategies: 1.Help client develop reality-based positive cognitive messages/self-talk. 2. Develop a "coping card" or other reminder which coping strategies are recorded for patient's later use.   Diagnosis:   ICD-10-CM   1. Bipolar I disorder with rapid cycling ([place])  F31.9      Plan: Patient in today and actively involved in session and showing/sharing that she is noticing some positives in the midst of her anxiety and depression/irritability. The positive she sees is her confronting her anxiety more, "more recently, rather than giving in to it". 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 Has notice how these connections have felt supportive to her, and not so alone. Less anger, less "being pissed off",  some decreased depression. Is finding that in reaching out to others, she is better able to manage difficult feelings versus the feelings managing her. More accepting that she cannot control husband and the negative choices he makes. No tearfulness today. Increased perseverance on patient's  part as she works to cope and manage her feelings in healthier ways. Encourage patient in her focusing on and practice of more positive behaviors as we discussed in session including having more belief in herself and her abilities to manage challenging circumstances, continue setting limits with others who are not supportive, focus on making good decisions versus impulsive decisions, remain in contact with supportive friends and family, use of journaling between sessions, taking breaks occasionally from her electronics, finding the strengths and positives within herself, refrain from assuming worst-case scenarios, getting outside daily, spending time with her 2 dogs who are very therapeutic for her, use of more positive self talk, and realize the strength she shows when working with goal-directed behaviors to move in a direction that supports her improved emotional health, self-confidence, and overall wellbeing.  Goal review and progress/challenges noted with patient.  Next appointment within 2 to 3 weeks.  This record has been created using [employer].  Chart creation errors have been sought, but may not always have been located and corrected.  Such creation errors do not reflect on the standard of medical care provided.   Shanon Ace, LCSW

## 2022-08-01 NOTE — Telephone Encounter (Signed)
Prior Authorization submitted and approved for INGREZZA 40 MG #[id number] DAY effective 05/03/2022-08/01/2023 with [employer].

## 2022-08-02 ENCOUNTER — Encounter: Payer: Self-pay | Admitting: Psychiatry

## 2022-08-03 ENCOUNTER — Other Ambulatory Visit: Payer: Self-pay | Admitting: Psychiatry

## 2022-08-03 DIAGNOSIS — F319 Bipolar disorder, unspecified: Secondary | ICD-10-CM

## 2022-08-03 NOTE — Telephone Encounter (Signed)
Please respond to her clozapine RF as appropriate.

## 2022-08-08 ENCOUNTER — Ambulatory Visit (INDEPENDENT_AMBULATORY_CARE_PROVIDER_SITE_OTHER): Payer: No Typology Code available for payment source | Admitting: Psychiatry

## 2022-08-08 ENCOUNTER — Encounter: Payer: Self-pay | Admitting: Psychiatry

## 2022-08-08 VITALS — BP 125/82 | Temp 103.0°F

## 2022-08-08 DIAGNOSIS — F411 Generalized anxiety disorder: Secondary | ICD-10-CM

## 2022-08-08 DIAGNOSIS — F319 Bipolar disorder, unspecified: Secondary | ICD-10-CM | POA: Diagnosis not present

## 2022-08-08 DIAGNOSIS — G2401 Drug induced subacute dyskinesia: Secondary | ICD-10-CM | POA: Diagnosis not present

## 2022-08-08 DIAGNOSIS — F900 Attention-deficit hyperactivity disorder, predominantly inattentive type: Secondary | ICD-10-CM | POA: Diagnosis not present

## 2022-08-08 DIAGNOSIS — R251 Tremor, unspecified: Secondary | ICD-10-CM

## 2022-08-08 DIAGNOSIS — G3184 Mild cognitive impairment, so stated: Secondary | ICD-10-CM

## 2022-08-08 DIAGNOSIS — F5105 Insomnia due to other mental disorder: Secondary | ICD-10-CM

## 2022-08-08 DIAGNOSIS — Z79899 Other long term (current) drug therapy: Secondary | ICD-10-CM

## 2022-08-08 DIAGNOSIS — F314 Bipolar disorder, current episode depressed, severe, without psychotic features: Secondary | ICD-10-CM

## 2022-08-08 MED ORDER — CLOZAPINE 50 MG PO TABS: 50.0000 mg | ORAL_TABLET | Freq: Two times a day (BID) | ORAL | 2 refills | Status: DC

## 2022-08-08 MED ORDER — DEXMETHYLPHENIDATE HCL 10 MG PO TABS: 15.0000 mg | ORAL_TABLET | Freq: Two times a day (BID) | ORAL | 0 refills | Status: DC

## 2022-08-08 NOTE — Progress Notes (Signed)
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 movements and biting tongue.  Sometimes hard to talk.  Drooling.  When tries to increase clozapine slurred speech and severe dizziness.   Mood is a little better.   Sleeping 8-9 hours.  So much better sleep with clozapine.   Still has anxiety, generalized. Plan: To minimize polypharmacy and improve tolerabilty:  Reduce Equetro to 1 of the 100 mg capsule at night for 1 week, then stop it. Wait 1 week then stop the carbamazepine chewable. Wait 1 more week then reduce clonidine to 1/2 at night for 1 week then stop it. Plan: Started clozapine  and continue 25 mg for now bc hasn't tolerated more so far.  02/08/22 appt noted: Mouth movements and biting tongue.  Sores on cheek with constant chewing movements. Sometimes hard to talk. Hypersalivation gets worse as day progresses. Sometimes balance problems.  Constipation managed.   Sleep very well.  8-9 hours. Off Equetro and on clozapine. Still has depression and anxiety without much change Plan: Started clozapine  and continue 25 mg for now bc hasn't tolerated more and need to start Ingrezza 40 mg for  TD.  03/23/2022 appointment with the following noted: Several phone calls since here.  Has gotten up to clozapine 37.5 mg nightly. Continues Focalin 15 mg twice daily, lamotrigine 200 mg twice daily, lithium 150 nightly. Started Ingrezza 40 mg daily. Ingrezza amazing difference but even with grant of $10000 can't afford it.  Not biting mouth and mouth less sore.  Less mouth movements but not gone Emotionally not real well with anxiety and depression and crying spells.  Also some irritability and anger.  Easily triggered anger. Sleep more broken with Ingrezza HS but 8-9 hours. Can be sedated if gets up early with slurred speech but not if full night sleep. Balance better off Equetro. Plan: Started clozapine but needs to increase bc minimal effect and better tolerance, so increase to 50 mg HS  04/05/22 appt noted: Increased clozapine to 50 mg HS.  Some groggy in the AM.  One day was dizzy.  Otherwise on occasion.  Takes it right before bed.   Still depression, hopeless, irritability and anger.  Some periods of racing thoughts.  Sometimes recognizes hypomanic episodes and somethimes doesn't recognize. Dog is  very sick and H with bone CA.  Not crying on clozapine as much. GERD and needs surgery for hiatal hernia. Signed up for water aerobics. Plan: Started clozapine but needs to increase bc minimal effect and better tolerance, so increase to 75 mg HS (Started clozapine on 12/07/21) Reduce librium 5 mg HS and plan to stop  05/10/22 appt med: TD partially better with Ingrezza 40.  Has  a grant.   Increased clozapine 75 mg HS, reduced Librium to 5 mg HS. Tolerated OK. Depression a little better.  Irritability still high.  Poor memory and easily confused. Sleep is pretty good and is better and needs to sleep longer.   Plan: DC librium Worsening TD partial response on 40 mg Ingrezza, increase to 60 mg daily   Continue clozapine 100 mg HS  05/17/2022 phone call complaining of sleeping more and feeling  sleepy and foggy thinking also dropping some things and drooling.  She was instructed to reduce the clozapine to 75 mg nightly to see if that was the problem. 05/23/2022 phone call asking to increase Ingrezza from 60 to 80 mg daily.  It was agreed. 06/16/2022 phone call stating she had a bad manic episode the week prior and is still feeling excessively sedated.  Also having hand tremors. Instructed to stop lithium and continue clozapine 75 mg nightly.  She is very med sensitive but we have few options left to treat her unstable bipolar disorder. 06/21/2022 phone call: After complaining of excessive sleepiness and excessive sleeping she is now complaining of insomnia.  07/07/22 appt noted: Current psychiatric medications include clozapine 75 mg nightly, Focalin 15 mg twice daily, lamotrigine 100 mg twice daily Ingrezza 80 mg daily.  She stopped lithium Has a list of concerns: SE drooling bad.   Still have mouth movements with the increase Ingrezza 80 mg daily but has stopped the tongue chewing. Goes to bed 9 PM and to sleep in 30 mins and awaken in the AM about 830 and hard to wake up.  Not napping in the day.  Getting enough sleep.   Notices Focalin kicking in when takes it. Mood depressed but not as bad.  Still some irritability, anger.  3 week ago bad manic anger lasting 3-4 days.  Not sure how her sleep was at the time. Some crying and poor impulse control.   PCP wanted 2nd opinion from neuro on ? PD, Dr. Carles Collet Nov 15. Plan: No med changes pending neurologic appointment  07/20/2022 neurology appointment Dr. Wells Guiles Tat.  Diagnosed TD.  Assessment as follows: 1.  Tardive dyskinesia -The patients symptoms are most consistent with tardive dyskinesia.  She has had exposure to typical and atypical antipsychotic medication.  TD is a heterogeneous syndrome depending on a subtle balance between several neurotransmitters in the brain, including DA receptor blockade and hypersensitivity of DA and GABA  receptors. -pt on ingrezza since 02/2022 and both she and notes from Dr. Clovis Pu indicate great benefit.  2.  Tremor             -Largely resolved off of lithium and vraylar (vraylar d/c in 12/2021)             -She has very minor left hand tremor.  Did tell her that Ingrezza can occasionally cause parkinsonism, but I did not see a significant degree of that today.             -I did reassure her today that I saw no evidence of idiopathic Parkinson's disease.  She was reassured.  3.  Bipolar d/o             -difficult to control per records             -sees Dr. Clovis Pu frequently  4.  Discussed with patient that she really does not need neurologic follow-up at this point in time.  She was happy to hear this.  08/08/2022 appointment noted: No med changes. Still having a lot of anxiety and worries too much. Worse than depression.  No mania since here.  No sig avoidance.   Hard time concentration. Still having irritability and anger. SE drowsy with clozapine in AM and hard to function until about 10 AM.  Takes it about 8 PM and then goes to bed.   No falling but is shuffling more since here.   Sleep 10 hours.     Past Psychiatric Medication Trials: Vraylar 4.5 SE mouth movements reduced to 3 mg 3/20. It was effective at lower doses for depression.  Worse off it.  Vraylar 1.5 mg every third day led to relapse of significant depression. [place]ta SE at 42 mg .  Cost problems Latuda 80, , olanzapine, Seroquel, risperidone, Abilify, loxapine 25 mg BID (max tolerated) NR, Clozapine 50  Ingrezza 40  lithium 150 tremor   Trileptal 450, Depakote, Equetro 300 hx balance issues, CBZ ER falling,   Lamictal 200 twice daily,  Focalin,  Ritalin,  fluoxetine 60,  sertraline 100, Wellbutrin history of facial tics, paroxetine cognitive side effects, Lexapro 10 worse buspirone,  ropinirole, amantadine, Sinemet, Artane, Cogentin,  pramipexole 0.5 mg BID helped tremor but caused anger trazodone hangover,  Ambien hangover,  Review of Systems:  Review of Systems  HENT:  Positive for dental problem and tinnitus.        Chirping cricket sounds in hears since January 2023 drooling  Cardiovascular:  Negative for palpitations.  Gastrointestinal:  Positive for abdominal pain. Negative for nausea.       GERD awakening her  Musculoskeletal:  Positive for arthralgias and gait problem.  Neurological:  Positive for dizziness and tremors. Negative for seizures.       Ankle problems and balance problems. No falls lately. Occ stumbles. Tremor is better in hands Mouth movements  Psychiatric/Behavioral:  Positive for decreased concentration and dysphoric mood. Negative for agitation, behavioral problems, confusion, hallucinations, self-injury, sleep disturbance and suicidal ideas. The patient is nervous/anxious. The patient is not hyperactive.   No falls since here. Not currently depressed but unable to remove this from the list.  Medications: I have reviewed the patient's current medications.  Current Outpatient Medications  Medication Sig Dispense Refill   acetaminophen (TYLENOL) 650 MG CR tablet Take 1,300 mg by mouth as needed for pain.     atorvastatin (LIPITOR) 20 MG tablet TAKE (1) TABLET BY MOUTH AT BEDTIME. 90 tablet 0   Ferrous Gluconate (IRON 27 PO) Take by mouth.     ketoconazole (NIZORAL) 2 % cream Apply 1 Application topically daily. 60 g 0   lamoTRIgine (LAMICTAL) 200 MG tablet TAKE (1) TABLET BY MOUTH TWICE DAILY. (Patient taking differently: 1/2 BID) 180 tablet 0   Melatonin 10 MG CAPS Take by mouth at bedtime as needed.     pantoprazole (PROTONIX) 40 MG tablet Take 1 tablet (40 mg total) by mouth 2 (two) times daily. 180 tablet 3   valbenazine (INGREZZA) 40 MG capsule Take 1 capsule (40 mg total) by mouth in the morning and at bedtime. 60 capsule 2  clozapine (CLOZARIL) 50 MG tablet Take 1 tablet (50 mg total) by mouth 2 (two) times daily. 14 tablet 2   dexmethylphenidate (FOCALIN) 10  MG tablet Take 1.5 tablets (15 mg total) by mouth 2 (two) times daily. 90 tablet 0   No current facility-administered medications for this visit.    Medication Side Effects: Other: tremor and weight gain.   Dyskinesia appears better  SE bettter than they were.  Balance problems intermittently  Allergies:  Allergies  Allergen Reactions   Azithromycin Anaphylaxis   Penicillins Anaphylaxis    DID THE REACTION INVOLVE: Swelling of the face/tongue/throat, SOB, or low BP? Yes Sudden or severe rash/hives, skin peeling, or the inside of the mouth or nose? Yes Did it require medical treatment? No When did it last happen?       If all above answers are "NO", may proceed with cephalosporin use.  Patient reacts to Z pack.  HAS Taken amoxicillin fine.   Adhesive [Tape] Other (See Comments)    On bandaids    Past Medical History:  Diagnosis Date   ADD (attention deficit disorder)    Allergy    Seasonal   Anemia    History of GI blood loss   Anxiety    Arthritis    Atrophy of vagina 10/07/2020   Bipolar 1 disorder (HCC)    Cancer ([place])    Colon polyps    Depression    Diabetes mellitus ([place])    pt denies   Edema, lower extremity    Epistaxis    Around 2011 or 2012, required cauterization.    Esophageal stricture    Fracture of superior pubic ramus (HCC) 11/28/2018   GERD (gastroesophageal reflux disease)    Headache(784.0)    Hyperlipidemia    Interstitial cystitis    Joint pain    Lactose intolerance    Lung cancer ([place]) 2002   Neuromuscular disorder ([place])    Obesity    Osteoarthritis    Palpitations    Sleep apnea    Doesn't use a CPAP   Suicidal ideation 01/20/2020   Swallowing difficulty    Tardive dyskinesia     Family History  Problem Relation Age of Onset   Arthritis Mother    Hearing loss Mother    Hyperlipidemia Mother    Hypertension Mother    Depression Mother    Anxiety disorder Mother    Obesity Mother    Sudden death Mother    Hypertension Father     Diabetes Mellitus II Father    Heart disease Father    Arthritis Father    Cancer Father        Brain   COPD Father    Diabetes Father    Hyperlipidemia Father    Sleep apnea Father    Early death Sister        Aneroxia/Bulimic   Depression Brother    Early death Proofreader Accident   Stroke Maternal Grandmother    Hypertension Maternal Grandmother    Arthritis Maternal Grandfather    Heart attack Maternal Grandfather    Hearing loss Maternal Grandfather    Depression Daughter    Drug abuse Daughter    Heart disease Daughter    Hypertension Daughter    Colon cancer Neg Hx    Esophageal cancer Neg Hx    Rectal cancer Neg Hx     Social History   Socioeconomic History   Marital status: Married  Spouse name: Not on file   Number of children: 1   Years of education: 64   Highest education level: Not on file  Occupational History   Occupation: retired  Tobacco Use   Smoking status: Never   Smokeless tobacco: Never  Vaping Use   Vaping Use: Never used  Substance and Sexual Activity   Alcohol use: Yes    Alcohol/week: 1.0 standard drink of alcohol    Types: 1 Glasses of wine per week    Comment: 1 glass wine q few weeks   Drug use: No   Sexual activity: Yes  Other Topics Concern   Not on file  Social History Narrative   [place] lives in [place] with husband Lanny Hurst.  Followed by Dr. Clovis Pu for psychiatry and Rinaldo Cloud for therapy.   Right handed   Drinks caffeine   One story home   Married lives with husband   retired   Investment banker, operational of Radio broadcast assistant Strain: [place]  (05/11/2022)   Overall Financial Resource Strain (CARDIA)    Difficulty of Paying Living Expenses: Not hard at all  Food Insecurity: No Food Insecurity (05/11/2022)   Hunger Vital Sign    Worried About Running Out of Food in the Last Year: Never true    Ran Out of Food in the Last Year: Never true  Transportation Needs: No Transportation Needs (05/11/2022)   PRAPARE  - Hydrologist (Medical): No    Lack of Transportation (Non-Medical): No  Physical Activity: Inactive (05/11/2022)   Exercise Vital Sign    Days of Exercise per Week: 0 days    Minutes of Exercise per Session: 0 min  Stress: No Stress Concern Present (05/09/2021)   [place]    Feeling of Stress : Only a little  Social Connections: Moderately Integrated (05/11/2022)   Social Connection and Isolation Panel [NHANES]    Frequency of Communication with Friends and Family: More than three times a week    Frequency of Social Gatherings with Friends and Family: More than three times a week    Attends Religious Services: More than 4 times per year    Active Member of [employer] or Organizations: No    Attends Archivist Meetings: Never    Marital Status: Married  Human resources officer Violence: Not At Risk (05/09/2021)   Humiliation, Afraid, Rape, and Kick questionnaire    Fear of Current or Ex-Partner: No    Emotionally Abused: No    Physically Abused: No    Sexually Abused: No    Past Medical History, Surgical history, Social history, and Family history were reviewed and updated as appropriate.   Please see review of systems for further details on the patient's review from today.   Objective:   Physical Exam:  BP 125/82   Temp (!) 103 F (39.4 C)   Physical Exam Constitutional:      General: She is not in acute distress. Musculoskeletal:        General: No deformity.  Neurological:     Mental Status: She is alert and oriented to person, place, and time.     Cranial Nerves: No dysarthria.     Motor: Tremor present.     Coordination: Coordination normal.     Comments: Mild resting R hand rotational tremor better but not gone. Fidgety mildy feet better Some buccolingual tremor is better but residual  Psychiatric:  Attention and Perception: Attention and perception normal. She does  not perceive auditory hallucinations.        Mood and Affect: Mood is anxious and depressed. Affect is not labile, blunt, angry or inappropriate.        Speech: Speech normal. Speech is not rapid and pressured.        Behavior: Behavior normal. Behavior is not agitated or slowed. Behavior is cooperative.        Thought Content: Thought content normal. Thought content is not paranoid or delusional. Thought content does not include homicidal or suicidal ideation. Thought content does not include suicidal plan.        Cognition and Memory: Cognition and memory normal.        Judgment: Judgment normal.     Comments: Insight intact more mood cycling and still anxious. Still anxious Overall mood needs more improvement but depression better with clozapine but not resolved      Lab Review:     Component Value Date/Time   NA 138 05/25/2022 1319   NA 141 03/18/2022 1021   K 4.0 05/25/2022 1319   CL 107 05/25/2022 1319   CO2 22 05/25/2022 1319   GLUCOSE 112 (H) 05/25/2022 1319   BUN 17 05/25/2022 1319   BUN 10 03/18/2022 1021   CREATININE 0.83 05/25/2022 1319   CALCIUM 9.3 05/25/2022 1319   PROT 6.8 03/18/2022 1021   ALBUMIN 4.3 03/18/2022 1021   AST 17 03/18/2022 1021   ALT 10 03/18/2022 1021   ALKPHOS 83 03/18/2022 1021   BILITOT 0.5 03/18/2022 1021   GFRNONAA >60 05/25/2022 1319   GFRAA 96 03/02/2020 1433       Component Value Date/Time   WBC 6.3 03/18/2022 1021   WBC 7.4 01/19/2020 1158   RBC 4.50 03/18/2022 1021   RBC 4.19 01/19/2020 1158   HGB 13.0 03/18/2022 1021   HCT 40.8 03/18/2022 1021   PLT 261 03/18/2022 1021   MCV 91 03/18/2022 1021   MCH 28.9 03/18/2022 1021   MCH 30.3 01/19/2020 1158   MCHC 31.9 03/18/2022 1021   MCHC 32.1 01/19/2020 1158   RDW 12.8 03/18/2022 1021   LYMPHSABS 2.1 03/18/2022 1021   MONOABS 0.7 04/04/2019 1004   EOSABS 0.4 03/18/2022 1021   BASOSABS 0.0 03/18/2022 1021  Vitamin D level 33 on 10K units daily on 12/4/`9 Increased to  prescription vitamin d 50K units Monday, Wed, Friday.  Rx sent in.   Lithium Lvl  Date Value Ref Range Status  10/21/2018 0.18 (L) 0.60 - 1.20 mmol/L Final    Comment:    Performed at [facility], [address] [place], [place] [address]     No results found for: "PHENYTOIN", "PHENOBARB", "VALPROATE", "CBMZ"   .res Assessment: Plan:    Bipolar I disorder with rapid cycling ([place]) - Plan: clozapine (CLOZARIL) 50 MG tablet  Generalized anxiety disorder  Tardive dyskinesia  Attention deficit hyperactivity disorder (ADHD), predominantly inattentive type  Insomnia due to mental condition  Mild cognitive impairment  Long term current use of clozapine  Tremor of both hands  Bipolar affective disorder, depressed, severe ([place]) - Plan: dexmethylphenidate (FOCALIN) 10 MG tablet  Greater than 50% of 45 min face to face time with patient was spent on counseling and coordination of care. We discussed multiple dxes and concerns.   Radie has chronic rapid cycling bipolar disorder which is chronically unstable and has been difficult to control.  Failed 14 different mood stabilizers.  The rapid cycling is making  it difficult to control frequency of depressive episodes and the anxiety as well.  We have typically had to make frequent med changes.   Disc gradual increase bc med sensitivity.  Med sensitivity to SE seems to be the biggest problem preventing better mood control  (Started clozapine on 12/07/21).  For residual anxiety and irritability increase to 25 mg AM and 75 mg PM. Having SE drooling and mild sedation.  Eventually sedation likely to get better.  Ingrezza 40 mg for TD helped stop tongue chewing but still some mouth movements at 80 mg, which was started 05/23/22.  Shuffling a little more and can't reduce Ingrezza.  Consider switch to [place].  Disc ECT. Only FDA approved option left. However she decided to pursue clozapine and is up to 25 mg for 1 night. Extensive discussion of  clozapine dosing recommendations but we will increase more slowly bc she is so med sensitive.Disc risk low WBC, cardiomyopathy, etc, sedation Disc weekly labs etc and REMS program.    Prone to UTI and may be causing confusion discussed.  Had confirmed UTI recently.  She has a high residual anxiety.  It has been impossible to control all of her symptoms simultaneously without causing side effects. Failed various meds.  Discussed side effects of each medicine. Continue   Focalin IR 15 mg BID DT Cost and off label for depression  Try to spread this out if possible for mood.  She feels this is necessary  Lamotrigine reduced to  100 mg twice daily to try to reduce polypharmacy and so improve tolerability of clozapine.  consider reduction if clozapine helps.  Failed all reasonable alternatives for anxiety.  Option viibryd  Discussed potential metabolic side effects associated with atypical antipsychotics, as well as potential risk for movement side effects. Advised pt to contact office if movement side effects occur.    Checked B12 folate bc memory complaints.  Normal B12 & folate on 05/25/22  Disc SE meds and this is heightened by the complication of necessary polypharmacy.  Supportive therapy in terms of dealing with husband's addiction and now new dx metastatic prostate CA.Marland Kitchen  Requires frequent FU DT chronic instability.  Wants to schedule monthly.  FU 4 weeks.   Lynder Parents MD, DFAPA  Please see After Visit Summary for patient specific instructions.    Future Appointments  Date Time Provider [place]  08/15/2022  1:30 PM Jules Husbands, MD AS-AS None  08/22/2022 10:00 AM Shanon Ace, LCSW CP-CP None  09/08/2022 10:30 AM Cottle, Billey Co., MD CP-CP None  09/12/2022 10:00 AM Shanon Ace, LCSW CP-CP None  09/22/2022  1:40 PM Lindell Spar, MD RPC-RPC RPC  10/03/2022 10:00 AM Shanon Ace, LCSW CP-CP None  10/11/2022  1:30 PM Cottle, Billey Co., MD CP-CP None  10/17/2022  10:00 AM Shanon Ace, LCSW CP-CP None  11/07/2022 10:00 AM Shanon Ace, LCSW CP-CP None  11/09/2022 11:30 AM Cottle, Billey Co., MD CP-CP None  11/28/2022 10:00 AM Shanon Ace, LCSW CP-CP None    No orders of the defined types were placed in this encounter.      -------------------------------

## 2022-08-09 DIAGNOSIS — F319 Bipolar disorder, unspecified: Secondary | ICD-10-CM | POA: Diagnosis not present

## 2022-08-09 DIAGNOSIS — Z79899 Other long term (current) drug therapy: Secondary | ICD-10-CM | POA: Diagnosis not present

## 2022-08-10 ENCOUNTER — Encounter: Payer: Self-pay | Admitting: Psychiatry

## 2022-08-15 ENCOUNTER — Ambulatory Visit: Payer: No Typology Code available for payment source | Admitting: Surgery

## 2022-08-16 DIAGNOSIS — F319 Bipolar disorder, unspecified: Secondary | ICD-10-CM | POA: Diagnosis not present

## 2022-08-16 DIAGNOSIS — Z79899 Other long term (current) drug therapy: Secondary | ICD-10-CM | POA: Diagnosis not present

## 2022-08-17 ENCOUNTER — Encounter: Payer: Self-pay | Admitting: Psychiatry

## 2022-08-19 ENCOUNTER — Encounter: Payer: Self-pay | Admitting: Internal Medicine

## 2022-08-22 ENCOUNTER — Ambulatory Visit (INDEPENDENT_AMBULATORY_CARE_PROVIDER_SITE_OTHER): Payer: No Typology Code available for payment source | Admitting: Psychiatry

## 2022-08-22 DIAGNOSIS — F319 Bipolar disorder, unspecified: Secondary | ICD-10-CM | POA: Diagnosis not present

## 2022-08-22 NOTE — Progress Notes (Signed)
[facility] Counselor/Therapist Progress Note  Patient ID: Megan Whitney, MRN: [id number],    Date: 08/22/2022  Time Spent: 50 minutes   Treatment Type: Individual Therapy  Reported Symptoms: anxious, some depression, some "ups and downs", denies any SI, some lashing out when stress is heightened  Mental Status Exam:  Appearance:   Casual     Behavior:  Appropriate, Sharing, and Motivated  Motor:  Normal  Speech/Language:   Clear and Coherent  Affect:  Depressed and anxious  Mood:  anxious, depressed, and irritable  Thought process:  goal directed  Thought content:    Rumination  Sensory/Perceptual disturbances:    WNL  Orientation:  oriented to person, place, time/date, situation, day of week, month of year, year, and stated date of Dec. 18, 2023  Attention:  Fair  Concentration:  Fair  Memory:  Reports some difficulty in short term memory and PCP is aware  Fund of knowledge:   Fair  Insight:    Good and Fair  Judgment:   Good and Fair  Impulse Control:  Fair   Risk Assessment: Danger to Self:  No Self-injurious Behavior: No Danger to Others: No Duty to Warn:no Physical Aggression / Violence:No  Access to Firearms a concern: No  Gang Involvement:No   Subjective: Patient today reporting anxiety, depression, some "ups and downs", some lashing out, but denies any SI.  Recent stressor within family between 2 sets of grandparents and patient talked this through more especially her hurt feelings within family relationships. Concerned about the mouth movements she is continuing to have as side effect to some of her medication and states she plans to check in with nurse on this while here today. "Most worried about husband dying alone" and patient states he is wanting to spend his last days at home versus hospice."  However he has not seem to progress further, continues his drinking, and getting out of the house at times.  Processing her feelings about husband and his  cancer, and also his not going along with doctor recommendations especially in his consumption of alcohol.. Finding ways to use her time more including caring for herself better through activities, contact with friends and family, and rest.  Continue to work on her self-care and self talk especially as it relates to some of the issues she is confronted with within family relationships.  Fluctuates between sometimes feeling more empowered and other times feeling "weaker" and relationships.  Encouraged her more positive self-care and staying in good contact with the people in her life that feels healthier to her and that she is supported by.  Finds that being with her 2 dogs helps her as well as they offer good support and closeness.  Interventions: Cognitive Behavioral Therapy, Solution-Oriented/Positive Psychology, and Ego-Supportive  Long Term Goal: Reduce overall level, frequency, and intensity of the anxiety so that daily functioning is not impaired. Short Term Goal: 1.Increase understanding of the beliefs and messages that produce the worry and anxiety. Strategies: 1.Help client develop reality-based positive cognitive messages/self-talk. 2. Develop a "coping card" or other reminder which coping strategies are recorded for patient's later use.  Diagnosis:   ICD-10-CM   1. Bipolar I disorder with rapid cycling ([place])  F31.9      Plan: Patient today showing good effort in trying to better manage her anxious thoughts.  Described some family situations that have been sensitive for her especially in trying not to compare herself to the other grandparents involved in the family.  Patient realizing that this type of thinking is not helpful to her but finds it hard to refrain from it.  Looking at some ways that she can focus on healthier parts of herself and how she is involved with grandchild even though the time involved may not be quite the same as the other grandparents who live much closer to the  child and family.  Focused on what helps versus what does not help and that seemed to be helpful some to patient.  Realizing also that right now with all that is going on with husband's illness and their relationship, "I am already on edge at times and sometimes little things can bother me more".  Affirmed patient in her thinking but also in ways to be more self caring and interrupt some of her thinking that seems to lead to negative results for her and the ways she defines herself.  Motivated and stated that she felt even more motivated upon leaving but does admit that it tends to "go and come sometimes".  A positive however is that she does continue to confront some of her anxiety and depression at times and notices some benefit.  Also some awareness that her assumptions do not always turn out to be true, "but this is harder in the moment when something happens".  Anger not as strong today.  Continues to try and manage difficult feelings versus the feelings always managing her.  Recognizing more of what "is out of my control".  Some minimal tearfulness today.  Does show continued effort and better managing her feelings and moods, and trying to not make sudden judgment nor responses when things happen unexpectedly, but rather give herself some time to think through situations as her response might be very different from if she reacted suddenly. Encouraged patient in her practice of more positive behaviors as discussed in session including having more belief in herself and her abilities to manage challenging circumstances, setting limits with others who are not supportive, focus on making good decisions versus impulsive decisions, stay in contact with supportive friends and family, use of journaling between sessions, taking breaks occasionally from her electronics, finding the strengths and positives within herself, refrain from assuming worst-case scenarios, getting outside daily, spending time with her 2 dogs who  are very therapeutic for her, use of more positive self talk, and recognize the strength she shows when working with goal-directed behaviors to move in a direction that supports her improved emotional health.  Goal review and progress/challenges noted with patient.  Next appointment within 2 to 3 weeks.  This record has been created using [employer].  Chart creation errors have been sought, but may not always have been located and corrected.  Such creation errors do not reflect on the standard of medical care provided.   Shanon Ace, LCSW

## 2022-08-23 DIAGNOSIS — Z79899 Other long term (current) drug therapy: Secondary | ICD-10-CM | POA: Diagnosis not present

## 2022-08-23 DIAGNOSIS — F319 Bipolar disorder, unspecified: Secondary | ICD-10-CM | POA: Diagnosis not present

## 2022-08-24 ENCOUNTER — Telehealth: Payer: Self-pay

## 2022-08-24 NOTE — Telephone Encounter (Signed)
Pt reports she is needing to change pharmacies for the clozapine. She was using [facility] but now going to use [facility]) in [place]. They are certified [facility]. She has one more refill left on her current Rx but then will make the change later this week.     Secondly, she was unable to tolerate taking 25 mg Clozapine in the morning so is still taking 75 mg at night. She would get very groggy, confused, and couldn't concentrate when taking in the AM.

## 2022-08-25 ENCOUNTER — Encounter: Payer: Self-pay | Admitting: Psychiatry

## 2022-08-26 ENCOUNTER — Other Ambulatory Visit: Payer: Self-pay | Admitting: Psychiatry

## 2022-08-26 DIAGNOSIS — F319 Bipolar disorder, unspecified: Secondary | ICD-10-CM

## 2022-08-26 MED ORDER — CLOZAPINE 25 MG PO TABS: 75.0000 mg | ORAL_TABLET | Freq: Every day | ORAL | 3 refills | Status: DC

## 2022-08-26 NOTE — Telephone Encounter (Signed)
Noted thank you

## 2022-08-26 NOTE — Telephone Encounter (Signed)
Megan Whitney has changed her mind. She will continue Clozapine refills at [facility]

## 2022-08-26 NOTE — Telephone Encounter (Signed)
Corrected Rx to clozapine 75 mg HS to [facility]

## 2022-08-31 DIAGNOSIS — Z79899 Other long term (current) drug therapy: Secondary | ICD-10-CM | POA: Diagnosis not present

## 2022-08-31 DIAGNOSIS — F319 Bipolar disorder, unspecified: Secondary | ICD-10-CM | POA: Diagnosis not present

## 2022-09-01 ENCOUNTER — Other Ambulatory Visit: Payer: Self-pay | Admitting: Psychiatry

## 2022-09-01 ENCOUNTER — Encounter: Payer: Self-pay | Admitting: Psychiatry

## 2022-09-01 DIAGNOSIS — F319 Bipolar disorder, unspecified: Secondary | ICD-10-CM

## 2022-09-07 ENCOUNTER — Ambulatory Visit (INDEPENDENT_AMBULATORY_CARE_PROVIDER_SITE_OTHER): Payer: No Typology Code available for payment source | Admitting: Psychiatry

## 2022-09-07 ENCOUNTER — Encounter: Payer: Self-pay | Admitting: Psychiatry

## 2022-09-07 VITALS — BP 127/92 | HR 92

## 2022-09-07 DIAGNOSIS — F411 Generalized anxiety disorder: Secondary | ICD-10-CM

## 2022-09-07 DIAGNOSIS — R251 Tremor, unspecified: Secondary | ICD-10-CM | POA: Diagnosis not present

## 2022-09-07 DIAGNOSIS — G3184 Mild cognitive impairment, so stated: Secondary | ICD-10-CM

## 2022-09-07 DIAGNOSIS — F319 Bipolar disorder, unspecified: Secondary | ICD-10-CM

## 2022-09-07 DIAGNOSIS — Z79899 Other long term (current) drug therapy: Secondary | ICD-10-CM | POA: Diagnosis not present

## 2022-09-07 DIAGNOSIS — F900 Attention-deficit hyperactivity disorder, predominantly inattentive type: Secondary | ICD-10-CM | POA: Diagnosis not present

## 2022-09-07 DIAGNOSIS — G2401 Drug induced subacute dyskinesia: Secondary | ICD-10-CM | POA: Diagnosis not present

## 2022-09-07 DIAGNOSIS — F5105 Insomnia due to other mental disorder: Secondary | ICD-10-CM | POA: Diagnosis not present

## 2022-09-07 MED ORDER — FLUVOXAMINE MALEATE 25 MG PO TABS: 25.0000 mg | ORAL_TABLET | Freq: Every day | ORAL | 0 refills | Status: DC

## 2022-09-07 NOTE — Progress Notes (Signed)
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 alittle dizzy. SE: Still having mouth movements and biting tongue.  Sometimes hard to talk.  Drooling.  When tries to increase clozapine slurred speech and severe dizziness.   Mood is a little better.   Sleeping 8-9 hours.  So much better sleep with clozapine.   Still has anxiety, generalized. Plan: To minimize polypharmacy and improve tolerabilty:  Reduce Equetro to 1 of the 100 mg capsule at night for 1 week, then stop it. Wait 1 week then stop the carbamazepine chewable. Wait 1 more week then reduce clonidine to 1/2 at night for 1 week then stop it. Plan: Started clozapine  and continue 25 mg for now bc hasn't tolerated more so far.  02/08/22 appt noted: Mouth movements and biting tongue.  Sores on cheek with constant chewing movements. Sometimes hard to talk. Hypersalivation gets worse as day progresses. Sometimes balance problems.  Constipation managed.   Sleep very well.  8-9 hours. Off Equetro and on clozapine. Still has depression and anxiety without much change Plan: Started clozapine  and continue 25 mg for now bc hasn't tolerated more and need to  start Ingrezza 40 mg for TD.  03/23/2022 appointment with the following noted: Several phone calls since here.  Has gotten up to clozapine 37.5 mg nightly. Continues Focalin 15 mg twice daily, lamotrigine 200 mg twice daily, lithium 150 nightly. Started Ingrezza 40 mg daily. Ingrezza amazing difference but even with grant of $10000 can't afford it.  Not biting mouth and mouth less sore.  Less mouth movements but not gone Emotionally not real well with anxiety and depression and crying spells.  Also some irritability and anger.  Easily triggered anger. Sleep more broken with Ingrezza HS but 8-9 hours. Can be sedated if gets up early with slurred speech but not if full night sleep. Balance better off Equetro. Plan: Started clozapine but needs to increase bc minimal effect and better tolerance, so increase to 50 mg HS  04/05/22 appt noted: Increased clozapine to 50 mg HS.  Some groggy in the AM.  One day was dizzy.  Otherwise on occasion.  Takes it right before bed.   Still depression, hopeless, irritability and anger.  Some periods of racing thoughts.  Sometimes recognizes hypomanic episodes and somethimes  doesn't recognize. Dog is very sick and H with bone CA.  Not crying on clozapine as much. GERD and needs surgery for hiatal hernia. Signed up for water aerobics. Plan: Started clozapine but needs to increase bc minimal effect and better tolerance, so increase to 75 mg HS (Started clozapine on 12/07/21) Reduce librium 5 mg HS and plan to stop  05/10/22 appt med: TD partially better with Ingrezza 40.  Has  a grant.   Increased clozapine 75 mg HS, reduced Librium to 5 mg HS. Tolerated OK. Depression a little better.  Irritability still high.  Poor memory and easily confused. Sleep is pretty good and is better and needs to sleep longer.   Plan: DC librium Worsening TD partial response on 40 mg Ingrezza, increase to 60 mg daily   Continue clozapine 100 mg HS  05/17/2022 phone call complaining of  sleeping more and feeling sleepy and foggy thinking also dropping some things and drooling.  She was instructed to reduce the clozapine to 75 mg nightly to see if that was the problem. 05/23/2022 phone call asking to increase Ingrezza from 60 to 80 mg daily.  It was agreed. 06/16/2022 phone call stating she had a bad manic episode the week prior and is still feeling excessively sedated.  Also having hand tremors. Instructed to stop lithium and continue clozapine 75 mg nightly.  She is very med sensitive but we have few options left to treat her unstable bipolar disorder. 06/21/2022 phone call: After complaining of excessive sleepiness and excessive sleeping she is now complaining of insomnia.  07/07/22 appt noted: Current psychiatric medications include clozapine 75 mg nightly, Focalin 15 mg twice daily, lamotrigine 100 mg twice daily Ingrezza 80 mg daily.  She stopped lithium Has a list of concerns: SE drooling bad.   Still have mouth movements with the increase Ingrezza 80 mg daily but has stopped the tongue chewing. Goes to bed 9 PM and to sleep in 30 mins and awaken in the AM about 830 and hard to wake up.  Not napping in the day.  Getting enough sleep.   Notices Focalin kicking in when takes it. Mood depressed but not as bad.  Still some irritability, anger.  3 week ago bad manic anger lasting 3-4 days.  Not sure how her sleep was at the time. Some crying and poor impulse control.   PCP wanted 2nd opinion from neuro on ? PD, Dr. Carles Collet Nov 15. Plan: No med changes pending neurologic appointment  07/20/2022 neurology appointment Dr. Wells Guiles Tat.  Diagnosed TD.  Assessment as follows: 1.  Tardive dyskinesia -The patients symptoms are most consistent with tardive dyskinesia.  She has had exposure to typical and atypical antipsychotic medication.  TD is a heterogeneous syndrome depending on a subtle balance between several neurotransmitters in the brain, including DA receptor blockade and  hypersensitivity of DA and GABA receptors. -pt on ingrezza since 02/2022 and both she and notes from Dr. Clovis Pu indicate great benefit.  2.  Tremor             -Largely resolved off of lithium and vraylar (vraylar d/c in 12/2021)             -She has very minor left hand tremor.  Did tell her that Ingrezza can occasionally cause parkinsonism, but I did not see a significant degree of that today.             -I did reassure her today that I saw no evidence of  idiopathic Parkinson's disease.  She was reassured.  3.  Bipolar d/o             -difficult to control per records             -sees Dr. Clovis Pu frequently  4.  Discussed with patient that she really does not need neurologic follow-up at this point in time.  She was happy to hear this.  08/08/2022 appointment noted: No med changes. Still having a lot of anxiety and worries too much. Worse than depression.  No mania since here.  No sig avoidance.   Hard time concentration. Still having irritability and anger. SE drowsy with clozapine in AM and hard to function until about 10 AM.  Takes it about 8 PM and then goes to bed.   No falling but is shuffling more since here.   Sleep 10 hours.    09/07/22 appt noted:   Consistently on clozapine 75 mg HS.  Too drowsy if takes 25 mg in AM. Doing so so .  A lot of anxiety, anger, irritation.   Avg 8-9 hours of sleep and pushes herself to get up .  Hard to function in am until 11 or 12 noon. Ingreza 40 mg BID still some mouth movements and biting tongue.  Is better with Ingrezza but not gone.   SE consitpation and drowsy.   She is aware of the difficulty finding balance between aenough med to manage her sx and not so much to cause intolerable SE.  Disc dosing of her meds.  Past Psychiatric Medication Trials: Vraylar 4.5 SE mouth movements reduced to 3 mg 3/20. It was effective at lower doses for depression.  Worse off it.  Vraylar 1.5 mg every third day led to relapse of significant  depression. [place] SE at 42 mg .  Cost problems Latuda 80, , olanzapine, Seroquel, risperidone, Abilify, loxapine 25 mg BID (max tolerated) NR, Clozapine 50  Ingrezza 40 BID  lithium 150 tremor   Trileptal 450, Depakote, Equetro 300 hx balance issues, CBZ ER falling,   Lamictal 200 twice daily,  Focalin,  Ritalin,  fluoxetine 60,  sertraline 100, Wellbutrin history of facial tics, paroxetine cognitive side effects, Lexapro 10 worse buspirone,  ropinirole, amantadine, Sinemet, Artane, Cogentin,  pramipexole 0.5 mg BID helped tremor but caused anger trazodone hangover, Ambien hangover,  Review of Systems:  Review of Systems  HENT:  Positive for dental problem and tinnitus.        Chirping cricket sounds in hears since January 2023 drooling  Cardiovascular:  Negative for chest pain and palpitations.  Gastrointestinal:  Negative for abdominal pain and nausea.       GERD awakening her  Musculoskeletal:  Positive for arthralgias and gait problem.  Neurological:  Positive for dizziness and tremors. Negative for seizures.       Ankle problems and balance problems. No falls lately. Occ stumbles. Tremor is better in hands Mouth movements  Psychiatric/Behavioral:  Positive for decreased concentration, depression and dysphoric mood. Negative for agitation, behavioral problems, confusion, hallucinations, self-injury, sleep disturbance and suicidal ideas. The patient is nervous/anxious. The patient is not hyperactive.   No falls since here. Not currently depressed but unable to remove this from the list.  Medications: I have reviewed the patient's current medications.  Current Outpatient Medications  Medication Sig Dispense Refill   acetaminophen (TYLENOL) 650 MG CR tablet Take 1,300 mg by mouth as needed for pain.     atorvastatin (LIPITOR) 20 MG tablet TAKE (1)  TABLET BY MOUTH AT BEDTIME. 90 tablet 0   clozapine (CLOZARIL) 25 MG tablet Take 3 tablets (75 mg total) by mouth at bedtime.  21 tablet 3   dexmethylphenidate (FOCALIN) 10 MG tablet Take 1.5 tablets (15 mg total) by mouth 2 (two) times daily. 90 tablet 0   Ferrous Gluconate (IRON 27 PO) Take by mouth.     fluvoxaMINE (LUVOX) 25 MG tablet Take 1 tablet (25 mg total) by mouth at bedtime. 90 tablet 0   ketoconazole (NIZORAL) 2 % cream Apply 1 Application topically daily. 60 g 0   lamoTRIgine (LAMICTAL) 200 MG tablet TAKE (1) TABLET BY MOUTH TWICE DAILY. (Patient taking differently: 1/2 BID) 180 tablet 0   Melatonin 10 MG CAPS Take by mouth at bedtime as needed.     pantoprazole (PROTONIX) 40 MG tablet Take 1 tablet (40 mg total) by mouth 2 (two) times daily. 180 tablet 3   valbenazine (INGREZZA) 40 MG capsule Take 1 capsule (40 mg total) by mouth in the morning and at bedtime. 60 capsule 2   No current facility-administered medications for this visit.    Medication Side Effects: Other: tremor and weight gain.   Dyskinesia appears better  SE bettter than they were.  Balance problems intermittently  Allergies:  Allergies  Allergen Reactions   Azithromycin Anaphylaxis   Penicillins Anaphylaxis    DID THE REACTION INVOLVE: Swelling of the face/tongue/throat, SOB, or low BP? Yes Sudden or severe rash/hives, skin peeling, or the inside of the mouth or nose? Yes Did it require medical treatment? No When did it last happen?       If all above answers are "NO", may proceed with cephalosporin use.  Patient reacts to Z pack.  HAS Taken amoxicillin fine.   Adhesive [Tape] Other (See Comments)    On bandaids    Past Medical History:  Diagnosis Date   ADD (attention deficit disorder)    Allergy    Seasonal   Anemia    History of GI blood loss   Anxiety    Arthritis    Atrophy of vagina 10/07/2020   Bipolar 1 disorder (HCC)    Cancer ([place])    Colon polyps    Depression    Diabetes mellitus ([place])    pt denies   Edema, lower extremity    Epistaxis    Around 2011 or 2012, required cauterization.    Esophageal  stricture    Fracture of superior pubic ramus (HCC) 11/28/2018   GERD (gastroesophageal reflux disease)    Headache(784.0)    Hyperlipidemia    Interstitial cystitis    Joint pain    Lactose intolerance    Lung cancer ([place]) 2002   Neuromuscular disorder ([place])    Obesity    Osteoarthritis    Palpitations    Sleep apnea    Doesn't use a CPAP   Suicidal ideation 01/20/2020   Swallowing difficulty    Tardive dyskinesia     Family History  Problem Relation Age of Onset   Arthritis Mother    Hearing loss Mother    Hyperlipidemia Mother    Hypertension Mother    Depression Mother    Anxiety disorder Mother    Obesity Mother    Sudden death Mother    Hypertension Father    Diabetes Mellitus II Father    Heart disease Father    Arthritis Father    Cancer Father        Brain   COPD Father  Diabetes Father    Hyperlipidemia Father    Sleep apnea Father    Early death Sister        Aneroxia/Bulimic   Depression Brother    Early death Proofreader Accident   Stroke Maternal Grandmother    Hypertension Maternal Grandmother    Arthritis Maternal Grandfather    Heart attack Maternal Grandfather    Hearing loss Maternal Grandfather    Depression Daughter    Drug abuse Daughter    Heart disease Daughter    Hypertension Daughter    Colon cancer Neg Hx    Esophageal cancer Neg Hx    Rectal cancer Neg Hx     Social History   Socioeconomic History   Marital status: Married    Spouse name: Not on file   Number of children: 1   Years of education: 12   Highest education level: Not on file  Occupational History   Occupation: retired  Tobacco Use   Smoking status: Never   Smokeless tobacco: Never  Vaping Use   Vaping Use: Never used  Substance and Sexual Activity   Alcohol use: Yes    Alcohol/week: 1.0 standard drink of alcohol    Types: 1 Glasses of wine per week    Comment: 1 glass wine q few weeks   Drug use: No   Sexual activity: Yes  Other Topics  Concern   Not on file  Social History Narrative   Pt lives in [place] with husband Lanny Hurst.  Followed by Dr. Clovis Pu for psychiatry and Rinaldo Cloud for therapy.   Right handed   Drinks caffeine   One story home   Married lives with husband   retired   Investment banker, operational of Radio broadcast assistant Strain: [place]  (05/11/2022)   Overall Financial Resource Strain (CARDIA)    Difficulty of Paying Living Expenses: Not hard at all  Food Insecurity: No Food Insecurity (05/11/2022)   Hunger Vital Sign    Worried About Running Out of Food in the Last Year: Never true    Ran Out of Food in the Last Year: Never true  Transportation Needs: No Transportation Needs (05/11/2022)   PRAPARE - Hydrologist (Medical): No    Lack of Transportation (Non-Medical): No  Physical Activity: Inactive (05/11/2022)   Exercise Vital Sign    Days of Exercise per Week: 0 days    Minutes of Exercise per Session: 0 min  Stress: No Stress Concern Present (05/09/2021)   [place]    Feeling of Stress : Only a little  Social Connections: Moderately Integrated (05/11/2022)   Social Connection and Isolation Panel [NHANES]    Frequency of Communication with Friends and Family: More than three times a week    Frequency of Social Gatherings with Friends and Family: More than three times a week    Attends Religious Services: More than 4 times per year    Active Member of [employer] or Organizations: No    Attends Archivist Meetings: Never    Marital Status: Married  Human resources officer Violence: Not At Risk (05/09/2021)   Humiliation, Afraid, Rape, and Kick questionnaire    Fear of Current or Ex-Partner: No    Emotionally Abused: No    Physically Abused: No    Sexually Abused: No    Past Medical History, Surgical history, Social history, and Family history were reviewed  and updated as appropriate.   Please see review of  systems for further details on the patient's review from today.   Objective:   Physical Exam:  BP (!) 127/92   Pulse 92   Physical Exam Constitutional:      General: She is not in acute distress. Musculoskeletal:        General: No deformity.  Neurological:     Mental Status: She is alert and oriented to person, place, and time.     Cranial Nerves: No dysarthria.     Motor: Tremor present.     Coordination: Coordination normal.     Comments: Mild resting R hand rotational tremor better but not gone. Fidgety mildy feet better Some buccolingual tremor is better but residual  Psychiatric:        Attention and Perception: Attention and perception normal. She does not perceive auditory hallucinations.        Mood and Affect: Mood is anxious and depressed. Affect is not labile, blunt, angry or inappropriate.        Speech: Speech normal. Speech is not rapid and pressured.        Behavior: Behavior normal. Behavior is not agitated or slowed. Behavior is cooperative.        Thought Content: Thought content normal. Thought content is not paranoid or delusional. Thought content does not include homicidal or suicidal ideation. Thought content does not include suicidal plan.        Cognition and Memory: Cognition and memory normal.        Judgment: Judgment normal.     Comments: Insight intact more mood cycling and still anxious. Without much change since last visit. Still anxious Overall mood needs more improvement but depression better with clozapine but not resolved      Lab Review:     Component Value Date/Time   NA 138 05/25/2022 1319   NA 141 03/18/2022 1021   K 4.0 05/25/2022 1319   CL 107 05/25/2022 1319   CO2 22 05/25/2022 1319   GLUCOSE 112 (H) 05/25/2022 1319   BUN 17 05/25/2022 1319   BUN 10 03/18/2022 1021   CREATININE 0.83 05/25/2022 1319   CALCIUM 9.3 05/25/2022 1319   PROT 6.8 03/18/2022 1021   ALBUMIN 4.3 03/18/2022 1021   AST 17 03/18/2022 1021   ALT 10  03/18/2022 1021   ALKPHOS 83 03/18/2022 1021   BILITOT 0.5 03/18/2022 1021   GFRNONAA >60 05/25/2022 1319   GFRAA 96 03/02/2020 1433       Component Value Date/Time   WBC 6.3 03/18/2022 1021   WBC 7.4 01/19/2020 1158   RBC 4.50 03/18/2022 1021   RBC 4.19 01/19/2020 1158   HGB 13.0 03/18/2022 1021   HCT 40.8 03/18/2022 1021   PLT 261 03/18/2022 1021   MCV 91 03/18/2022 1021   MCH 28.9 03/18/2022 1021   MCH 30.3 01/19/2020 1158   MCHC 31.9 03/18/2022 1021   MCHC 32.1 01/19/2020 1158   RDW 12.8 03/18/2022 1021   LYMPHSABS 2.1 03/18/2022 1021   MONOABS 0.7 04/04/2019 1004   EOSABS 0.4 03/18/2022 1021   BASOSABS 0.0 03/18/2022 1021  Vitamin D level 33 on 10K units daily on 12/4/`9 Increased to prescription vitamin d 50K units Monday, Wed, Friday.  Rx sent in.   Lithium Lvl  Date Value Ref Range Status  10/21/2018 0.18 (L) 0.60 - 1.20 mmol/L Final    Comment:    Performed at [facility], [address] [place], [place] [address]  No results found for: "PHENYTOIN", "PHENOBARB", "VALPROATE", "CBMZ"   .res Assessment: Plan:    Bipolar I disorder with rapid cycling ([place]) - Plan: CBC with Differential/Platelet, fluvoxaMINE (LUVOX) 25 MG tablet  Long term current use of clozapine - Plan: CBC with Differential/Platelet  Generalized anxiety disorder  Tardive dyskinesia  Attention deficit hyperactivity disorder (ADHD), predominantly inattentive type  Insomnia due to mental condition  Mild cognitive impairment  Tremor of both hands  Greater than 50% of 30 min face to face time with patient was spent on counseling and coordination of care. We discussed multiple dxes and concerns.   Channell has chronic rapid cycling bipolar disorder which is chronically unstable and has been difficult to control.  Failed 14 different mood stabilizers.  The rapid cycling is making it difficult to control frequency of depressive episodes and the anxiety as well.  We have typically had to make  frequent med changes.   Disc gradual increase bc med sensitivity.  Med sensitivity to SE seems to be the biggest problem preventing better mood control  (Started clozapine on 12/07/21).  Continue clozapine 75 mg PM bc can't tolerate more. Having SE drooling and mild sedation.  Eventually sedation likely to get better.  Augment clozapine with fluvoxamine 25 mg HS  Ingrezza for TD helped stop tongue chewing but still some mouth movements at 80 mg, which was started 05/23/22.  Shuffling a little more and can't reduce Ingrezza.  Consider switch to [place].  Disc ECT. Only FDA approved option left. However she decided to pursue clozapine and is up to 25 mg for 1 night. Extensive discussion of clozapine dosing recommendations but we will increase more slowly bc she is so med sensitive.Disc risk low WBC, cardiomyopathy, etc, sedation Disc change to  biweekly labs etc and REMS program.    Prone to UTI and may be causing confusion discussed.  Had confirmed UTI recently.  She has a high residual anxiety.  It has been impossible to control all of her symptoms simultaneously without causing side effects. Failed various meds.  Discussed side effects of each medicine. Continue   Focalin IR 15 mg BID DT Cost and off label for depression  Try to spread this out if possible for mood.  She feels this is necessary  Lamotrigine reduced to  100 mg twice daily to try to reduce polypharmacy and so improve tolerability of clozapine.  consider reduction if clozapine helps.  Failed all reasonable alternatives for anxiety.  Option viibryd  Discussed potential metabolic side effects associated with atypical antipsychotics, as well as potential risk for movement side effects. Advised pt to contact office if movement side effects occur.    Checked B12 folate bc memory complaints.  Normal B12 & folate on 05/25/22  Disc SE meds and this is heightened by the complication of necessary polypharmacy.  Supportive therapy in  terms of dealing with husband's addiction and now new dx metastatic prostate CA.Marland Kitchen  Requires frequent FU DT chronic instability.  Wants to schedule monthly.  FU 4 weeks.   Lynder Parents MD, DFAPA  Please see After Visit Summary for patient specific instructions.    Future Appointments  Date Time Provider [place]  09/12/2022 10:00 AM Shanon Ace, LCSW CP-CP None  09/22/2022  1:40 PM Lindell Spar, MD [facility] RPC  10/03/2022 10:00 AM Shanon Ace, LCSW CP-CP None  10/11/2022  1:30 PM Cottle, Billey Co., MD CP-CP None  10/17/2022 10:00 AM Shanon Ace, LCSW CP-CP None  11/07/2022 10:00 AM Karene Fry,  Neoma Laming, LCSW CP-CP None  11/09/2022 11:30 AM Cottle, Billey Co., MD CP-CP None  11/28/2022 10:00 AM Shanon Ace, LCSW CP-CP None  12/12/2022 10:00 AM Cottle, Billey Co., MD CP-CP None  01/09/2023 10:30 AM Cottle, Billey Co., MD CP-CP None    Orders Placed This Encounter  Procedures   CBC with Differential/Platelet       -------------------------------

## 2022-09-08 ENCOUNTER — Ambulatory Visit: Payer: No Typology Code available for payment source | Admitting: Psychiatry

## 2022-09-08 ENCOUNTER — Telehealth: Payer: Self-pay | Admitting: Psychiatry

## 2022-09-08 ENCOUNTER — Encounter: Payer: Self-pay | Admitting: Psychiatry

## 2022-09-08 NOTE — Telephone Encounter (Signed)
Faxed new orders to [facility] [address] [place] # [phone]

## 2022-09-12 ENCOUNTER — Ambulatory Visit (INDEPENDENT_AMBULATORY_CARE_PROVIDER_SITE_OTHER): Payer: No Typology Code available for payment source | Admitting: Psychiatry

## 2022-09-12 DIAGNOSIS — F319 Bipolar disorder, unspecified: Secondary | ICD-10-CM | POA: Diagnosis not present

## 2022-09-12 NOTE — Progress Notes (Signed)
[facility] Counselor/Therapist Progress Note  Patient ID: Megan Whitney, MRN: [id number],    Date: 09/12/2022  Time Spent: 55 minutes   Treatment Type: Individual Therapy  Reported Symptoms: anxiety, depression, irritability, fearful "in general", angry, decreased motivation, "but not suicidal"   Mental Status Exam:  Appearance:   Casual     Behavior:  Appropriate, Sharing, and Motivated  Motor:  Normal  Speech/Language:   Clear and Coherent  Affect:  Depressed and anxious  Mood:  anxious and depressed  Thought process:  goal directed  Thought content:    Rumination  Sensory/Perceptual disturbances:    WNL  Orientation:  oriented to person, place, time/date, situation, day of week, month of year, year, and stated date of Jan. 8, 2024  Attention:  Good  Concentration:  Good and Fair  Memory:  Some short term memory issues but "I tested as having no memory concerns"  Fund of knowledge:   Good  Insight:    Fair  Judgment:   Good and Fair  Impulse Control:  Good and Fair   Risk Assessment: Danger to Self:  No Self-injurious Behavior: No Danger to Others: No Duty to Warn:no Physical Aggression / Violence:No  Access to Firearms a concern: No  Gang Involvement:No   Subjective: Patient today in session report anxiety and depression continue as she still experiences some lashing out at other, including her husband when he is drinking, as he is not to be drinking (per Dr's orders). Processing further issues relating to her and other set of grandparents, still finding it hard not to compare herself to them.  Struggling with her fear about husband's alcoholism, worried about her dog who has a "bad eyelid". Worries about wearing my friends out by talking about husband's illness. Staying in contact with some friends, and has picked back up her knitting. Anticipatory grief continues. Trying to not jump to conclusions and discussed this further today. Processed feelings that it's  getting harder to  leave [facility] as she's afraid he will fall. Encouraged to be more understanding of herself and limits she needs to set at times. Mouth movements related to med side effects have continued.  Is speaking with nurse today about that.   Interventions: Cognitive Behavioral Therapy and Ego-Supportive  Long Term Goal: Reduce overall level, frequency, and intensity of the anxiety so that daily functioning is not impaired. Short Term Goal: 1.Increase understanding of the beliefs and messages that produce the worry and anxiety. Strategies: 1.Help client develop reality-based positive cognitive messages/self-talk. 2. Develop a "coping card" or other reminder which coping strategies are recorded for patient's later use  Diagnosis:   ICD-10-CM   1. Bipolar I disorder with rapid cycling ([place])  F31.9      Plan:  Patient today working well in session despite her earlier reported feelings of decreased motivation.  Shared and talk through more of her frustrations with husband's going drinking and going [place] from the doctors who are treating him for cancer.  Anger tends to fluctuate.  Continues to try and manage difficult feelings rather than letting the feelings manage her.  Also paying more attention to "what is in my control and what is not".  No tearfulness today.  Trying to refrain from sudden judgment on issues or situations and especially when things happen unexpectedly.  Vented her anger and frustration well in session and seem to enable her to process more of her own individual issues and concerns particularly in regard to  some family relationships and her tendency to compare herself to others.  Realizing when she operates out of her hurt, her review can get skewed very easily, and was able to discuss this in more detail in session today.  Progress noted as patient continues working with goal-directed behaviors to better manage current stressors and move in a forward  direction. Encouraged patient and practicing more positive behaviors as noted in session including: Believing more in herself and her abilities to manage challenging circumstances, setting limits with others who are not supportive, focus on making good decisions versus impulsive decisions, stay in contact with supportive friends and family, use of journaling between sessions, taking breaks occasionally from her electronics, finding the strengths and positives within herself, refrain from assuming worst-case scenarios, getting outside daily, spending time with her 2 dogs who are very therapeutic for her, use of more positive self talk, and realize the strength she shows when working with goal-directed behaviors to move in a direction that supports her improved emotional health and wellbeing.  Goal review and progress/challenges noted with patient.  Next appointment within 2 to 3 weeks.  This record has been created using [employer].  Chart creation errors have been sought, but may not always have been located and corrected.  Such creation errors do not reflect on the standard of medical care provided.   Shanon Ace, LCSW

## 2022-09-13 DIAGNOSIS — F319 Bipolar disorder, unspecified: Secondary | ICD-10-CM | POA: Diagnosis not present

## 2022-09-13 DIAGNOSIS — Z79899 Other long term (current) drug therapy: Secondary | ICD-10-CM | POA: Diagnosis not present

## 2022-09-22 ENCOUNTER — Ambulatory Visit (INDEPENDENT_AMBULATORY_CARE_PROVIDER_SITE_OTHER): Payer: No Typology Code available for payment source | Admitting: Internal Medicine

## 2022-09-22 ENCOUNTER — Encounter: Payer: Self-pay | Admitting: Internal Medicine

## 2022-09-22 VITALS — BP 132/84 | HR 90 | Resp 16 | Ht 60.0 in | Wt 179.6 lb

## 2022-09-22 DIAGNOSIS — E782 Mixed hyperlipidemia: Secondary | ICD-10-CM | POA: Diagnosis not present

## 2022-09-22 DIAGNOSIS — G2401 Drug induced subacute dyskinesia: Secondary | ICD-10-CM

## 2022-09-22 DIAGNOSIS — T43505A Adverse effect of unspecified antipsychotics and neuroleptics, initial encounter: Secondary | ICD-10-CM

## 2022-09-22 DIAGNOSIS — M546 Pain in thoracic spine: Secondary | ICD-10-CM | POA: Diagnosis not present

## 2022-09-22 DIAGNOSIS — K219 Gastro-esophageal reflux disease without esophagitis: Secondary | ICD-10-CM

## 2022-09-22 DIAGNOSIS — F319 Bipolar disorder, unspecified: Secondary | ICD-10-CM | POA: Diagnosis not present

## 2022-09-22 DIAGNOSIS — G8929 Other chronic pain: Secondary | ICD-10-CM | POA: Diagnosis not present

## 2022-09-22 DIAGNOSIS — M8580 Other specified disorders of bone density and structure, unspecified site: Secondary | ICD-10-CM | POA: Diagnosis not present

## 2022-09-22 NOTE — Assessment & Plan Note (Deleted)
Had referred to Neurology for sleep study Denies using CPAP, opted not to have sleep study

## 2022-09-22 NOTE — Assessment & Plan Note (Addendum)
F/u with Neurology Not on any medication currently, prefers to avoid taking medications for now

## 2022-09-22 NOTE — Progress Notes (Signed)
Established Patient Office Visit  Subjective:  Patient ID: Megan Whitney, female    DOB: 1956/08/27  Age: 67 y.o. MRN: [id number]  CC:  Chief Complaint  Patient presents with   Gastroesophageal Reflux    Patient is having a six month follow up on her GERD and HLD     HPI Megan Whitney is a 67 y.o. female with past medical history of bipolar disorder, ADD, anxiety, OSA, GERD, HLD and osteopenia who presents for f/u of her chronic medical conditions.  Bipolar disorder: She is on multiple medications for bipolar disorder and ADD. She follows up with Psychiatrist and has a therapist, who she visits every week.   GERD and hiatal hernia: She takes Protonix once daily for it.  She currently denies any nausea or vomiting, but has chronic acid reflux, which is improved recently.  Denies any melena or hematochezia currently.    Past Medical History:  Diagnosis Date   ADD (attention deficit disorder)    Allergy    Seasonal   Anemia    History of GI blood loss   Anxiety    Arthritis    Atrophy of vagina 10/07/2020   Bipolar 1 disorder (HCC)    Cancer (HCC)    Colon polyps    Depression    Diabetes mellitus (HCC)    pt denies   Edema, lower extremity    Epistaxis    Around 2011 or 2012, required cauterization.    Esophageal stricture    Fracture of superior pubic ramus (HCC) 11/28/2018   GERD (gastroesophageal reflux disease)    Headache(784.0)    Hyperlipidemia    Interstitial cystitis    Joint pain    Lactose intolerance    Lung cancer (HCC) 2002   Neuromuscular disorder (HCC)    Obesity    Osteoarthritis    Palpitations    Sleep apnea    Doesn't use a CPAP   Suicidal ideation 01/20/2020   Swallowing difficulty    Tardive dyskinesia     Past Surgical History:  Procedure Laterality Date   BALLOON DILATION  05/16/2012   Procedure: BALLOON DILATION;  Surgeon: Louis Meckel, MD;  Location: [facility] ENDOSCOPY;  Service: Endoscopy;  Laterality: N/A;   BUNIONECTOMY   2011   COLONOSCOPY     ENTEROSCOPY  05/16/2012   Procedure: ENTEROSCOPY;  Surgeon: Louis Meckel, MD;  Location: [facility] ENDOSCOPY;  Service: Endoscopy;  Laterality: N/A;   JOINT REPLACEMENT     right shoulder durgery 25 yrs ago  1988   TOTAL HIP ARTHROPLASTY Bilateral 2006, 2008   bilateral   TUBAL LIGATION  1990   WEDGE RESECTION  2002   lung cancer    Family History  Problem Relation Age of Onset   Arthritis Mother    Hearing loss Mother    Hyperlipidemia Mother    Hypertension Mother    Depression Mother    Anxiety disorder Mother    Obesity Mother    Sudden death Mother    Hypertension Father    Diabetes Mellitus II Father    Heart disease Father    Arthritis Father    Cancer Father        Brain   COPD Father    Diabetes Father    Hyperlipidemia Father    Sleep apnea Father    Early death Sister        Aneroxia/Bulimic   Depression Brother    Early death Brother  Car Accident   Stroke Maternal Grandmother    Hypertension Maternal Grandmother    Arthritis Maternal Grandfather    Heart attack Maternal Grandfather    Hearing loss Maternal Grandfather    Depression Daughter    Drug abuse Daughter    Heart disease Daughter    Hypertension Daughter    Colon cancer Neg Hx    Esophageal cancer Neg Hx    Rectal cancer Neg Hx     Social History   Socioeconomic History   Marital status: Married    Spouse name: Not on file   Number of children: 1   Years of education: 12   Highest education level: Not on file  Occupational History   Occupation: retired  Tobacco Use   Smoking status: Never   Smokeless tobacco: Never  Vaping Use   Vaping Use: Never used  Substance and Sexual Activity   Alcohol use: Yes    Alcohol/week: 1.0 standard drink of alcohol    Types: 1 Glasses of wine per week    Comment: 1 glass wine q few weeks   Drug use: No   Sexual activity: Yes  Other Topics Concern   Not on file  Social History Narrative   Pt lives in [place]  with husband [name].  Followed by Dr. Jennelle Human for psychiatry and Rockne Menghini for therapy.   Right handed   Drinks caffeine   One story home   Married lives with husband   retired   International aid/development worker of Corporate investment banker Strain: Low Risk  (05/11/2022)   Overall Financial Resource Strain (CARDIA)    Difficulty of Paying Living Expenses: Not hard at all  Food Insecurity: No Food Insecurity (05/11/2022)   Hunger Vital Sign    Worried About Running Out of Food in the Last Year: Never true    Ran Out of Food in the Last Year: Never true  Transportation Needs: No Transportation Needs (05/11/2022)   PRAPARE - Administrator, Civil Service (Medical): No    Lack of Transportation (Non-Medical): No  Physical Activity: Inactive (05/11/2022)   Exercise Vital Sign    Days of Exercise per Week: 0 days    Minutes of Exercise per Session: 0 min  Stress: No Stress Concern Present (05/09/2021)   [employer] of Occupational Health - Occupational Stress Questionnaire    Feeling of Stress : Only a little  Social Connections: Moderately Integrated (05/11/2022)   Social Connection and Isolation Panel [NHANES]    Frequency of Communication with Friends and Family: More than three times a week    Frequency of Social Gatherings with Friends and Family: More than three times a week    Attends Religious Services: More than 4 times per year    Active Member of [employer] or Organizations: No    Attends Banker Meetings: Never    Marital Status: Married  Catering manager Violence: Not At Risk (05/09/2021)   Humiliation, Afraid, Rape, and Kick questionnaire    Fear of Current or Ex-Partner: No    Emotionally Abused: No    Physically Abused: No    Sexually Abused: No    Outpatient Medications Prior to Visit  Medication Sig Dispense Refill   atorvastatin (LIPITOR) 20 MG tablet TAKE (1) TABLET BY MOUTH AT BEDTIME. 90 tablet 0   clozapine (CLOZARIL) 25 MG tablet Take 3 tablets (75 mg  total) by mouth at bedtime. 21 tablet 3   dexmethylphenidate (FOCALIN) 10 MG tablet Take  1.5 tablets (15 mg total) by mouth 2 (two) times daily. 90 tablet 0   Ferrous Gluconate (IRON 27 PO) Take by mouth.     fluvoxaMINE (LUVOX) 25 MG tablet Take 1 tablet (25 mg total) by mouth at bedtime. 90 tablet 0   lamoTRIgine (LAMICTAL) 200 MG tablet TAKE (1) TABLET BY MOUTH TWICE DAILY. (Patient taking differently: 1/2 BID) 180 tablet 0   Melatonin 10 MG CAPS Take by mouth at bedtime as needed.     pantoprazole (PROTONIX) 40 MG tablet Take 1 tablet (40 mg total) by mouth 2 (two) times daily. 180 tablet 3   valbenazine (INGREZZA) 40 MG capsule Take 1 capsule (40 mg total) by mouth in the morning and at bedtime. 60 capsule 2   acetaminophen (TYLENOL) 650 MG CR tablet Take 1,300 mg by mouth as needed for pain.     ketoconazole (NIZORAL) 2 % cream Apply 1 Application topically daily. 60 g 0   No facility-administered medications prior to visit.    Allergies  Allergen Reactions   Azithromycin Anaphylaxis   Penicillins Anaphylaxis    DID THE REACTION INVOLVE: Swelling of the face/tongue/throat, SOB, or low BP? Yes Sudden or severe rash/hives, skin peeling, or the inside of the mouth or nose? Yes Did it require medical treatment? No When did it last happen?       If all above answers are "NO", may proceed with cephalosporin use.  Patient reacts to Z pack.  HAS Taken amoxicillin fine.   Adhesive [Tape] Other (See Comments)    On bandaids    ROS Review of Systems  Constitutional:  Negative for chills and fever.  HENT:  Negative for congestion, sinus pressure, sinus pain and sore throat.   Eyes:  Negative for pain and discharge.  Respiratory:  Negative for cough and shortness of breath.   Cardiovascular:  Negative for palpitations.  Gastrointestinal:  Negative for diarrhea, nausea and vomiting.  Endocrine: Negative for polydipsia and polyuria.  Genitourinary:  Negative for dysuria and hematuria.   Musculoskeletal:  Negative for neck pain and neck stiffness.  Skin:  Negative for rash.  Neurological:  Negative for dizziness and weakness.  Psychiatric/Behavioral:  Positive for sleep disturbance. Negative for agitation and behavioral problems. The patient is nervous/anxious.       Objective:    Physical Exam Vitals reviewed.  Constitutional:      General: She is not in acute distress.    Appearance: She is obese. She is not diaphoretic.  HENT:     Head: Normocephalic and atraumatic.     Nose: Nose normal. No nasal tenderness.     Right Sinus: No maxillary sinus tenderness.     Left Sinus: No maxillary sinus tenderness.     Mouth/Throat:     Mouth: Mucous membranes are moist.  Eyes:     General: No scleral icterus.    Extraocular Movements: Extraocular movements intact.  Cardiovascular:     Rate and Rhythm: Normal rate and regular rhythm.     Pulses: Normal pulses.     Heart sounds: Normal heart sounds. No murmur heard. Pulmonary:     Breath sounds: Normal breath sounds. No wheezing or rales.  Musculoskeletal:     Cervical back: Neck supple. No tenderness.     Right lower leg: No edema.     Left lower leg: No edema.  Skin:    General: Skin is warm.     Findings: No rash.  Neurological:     General: No  focal deficit present.     Mental Status: She is alert and oriented to person, place, and time.     Sensory: No sensory deficit.     Motor: No weakness.     Gait: Gait abnormal.     Comments: Resting tremor of right hand  Psychiatric:        Behavior: Behavior normal.        Thought Content: Thought content normal.     BP 132/84 (BP Location: Right Arm, Patient Position: Sitting, Cuff Size: Normal)   Pulse 90   Resp 16   Ht 5' (1.524 m)   Wt 179 lb 9.6 oz (81.5 kg)   BMI 35.08 kg/m  Wt Readings from Last 3 Encounters:  09/22/22 179 lb 9.6 oz (81.5 kg)  07/20/22 175 lb (79.4 kg)  06/13/22 191 lb (86.6 kg)    Lab Results  Component Value Date   TSH  2.020 03/18/2022   Lab Results  Component Value Date   WBC 6.3 03/18/2022   HGB 13.0 03/18/2022   HCT 40.8 03/18/2022   MCV 91 03/18/2022   PLT 261 03/18/2022   Lab Results  Component Value Date   NA 138 05/25/2022   K 4.0 05/25/2022   CO2 22 05/25/2022   GLUCOSE 112 (H) 05/25/2022   BUN 17 05/25/2022   CREATININE 0.83 05/25/2022   BILITOT 0.5 03/18/2022   ALKPHOS 83 03/18/2022   AST 17 03/18/2022   ALT 10 03/18/2022   PROT 6.8 03/18/2022   ALBUMIN 4.3 03/18/2022   CALCIUM 9.3 05/25/2022   ANIONGAP 9 05/25/2022   EGFR 73 03/18/2022   GFR 69.31 04/04/2019   Lab Results  Component Value Date   CHOL 202 (H) 03/18/2022   Lab Results  Component Value Date   HDL 84 03/18/2022   Lab Results  Component Value Date   LDLCALC 107 (H) 03/18/2022   Lab Results  Component Value Date   TRIG 63 03/18/2022   Lab Results  Component Value Date   CHOLHDL 2.4 03/18/2022   Lab Results  Component Value Date   HGBA1C 5.7 (H) 03/18/2022      Assessment & Plan:   Problem List Items Addressed This Visit       Digestive   GERD (gastroesophageal reflux disease) - Primary    Usually well-controlled, on Pantoprazole 40 mg QD -and advised to avoid twice daily dosing Advised to avoid hot or spicy food Try to be sitting upright at least for 30 minutes after meal        Nervous and Auditory   Neuroleptic-induced tardive dyskinesia    Likely due to antipsychotic Has had neurology evaluation - visit note reviewed, on Ingrezza      Relevant Orders   CMP14+EGFR     Musculoskeletal and Integument   Osteopenia    Advised to take Calcium and Vitamin D supplements Reviewed and discussed DEXA scan, answered her concerns      Relevant Orders   CMP14+EGFR     Other   Bipolar disorder (HCC)    On Luvox, Lamictal, Ingrezza, clozapine and Focalin Follows up with Psychiatry Recent folic acid and B12 levels WNL MMSE - 30/30      Relevant Orders   CMP14+EGFR    Hyperlipidemia    On Atorvastatin Lipid profile reviewed      Relevant Orders   Lipid Profile   Chronic thoracic back pain    Reports upper/middle back pain Likely from DDD of thoracic spine and/or muscular  strain Advised to avoid heavy lifting and frequent bending Simple back exercises advised Heating pad and/or back brace as needed Tylenol as needed If persistent, will get imaging       No orders of the defined types were placed in this encounter.   Follow-up: Return in about 6 months (around 03/23/2023) for Annual physical.    Anabel Halon, MD

## 2022-09-22 NOTE — Assessment & Plan Note (Addendum)
Usually well-controlled, on Pantoprazole 40 mg QD -and advised to avoid twice daily dosing Advised to avoid hot or spicy food Try to be sitting upright at least for 30 minutes after meal

## 2022-09-22 NOTE — Assessment & Plan Note (Signed)
On Luvox, Lamictal, Ingrezza, clozapine and Focalin Follows up with Psychiatry Recent folic acid and B12 levels WNL MMSE - 30/30

## 2022-09-22 NOTE — Assessment & Plan Note (Addendum)
Likely due to antipsychotic Has had neurology evaluation - visit note reviewed, on Ingrezza

## 2022-09-22 NOTE — Assessment & Plan Note (Signed)
On Atorvastatin Lipid profile reviewed

## 2022-09-22 NOTE — Assessment & Plan Note (Signed)
Advised to take Calcium and Vitamin D supplements Reviewed and discussed DEXA scan, answered her concerns

## 2022-09-22 NOTE — Patient Instructions (Addendum)
Please continue taking Pantoprazole only once daily.  Please continue taking other medications as prescribed.  Please continue to follow low salt diet and ambulate as tolerated.  Please get fasting blood tests done in the next week.

## 2022-09-22 NOTE — Assessment & Plan Note (Signed)
Reports upper/middle back pain Likely from DDD of thoracic spine and/or muscular strain Advised to avoid heavy lifting and frequent bending Simple back exercises advised Heating pad and/or back brace as needed Tylenol as needed If persistent, will get imaging

## 2022-09-27 DIAGNOSIS — E782 Mixed hyperlipidemia: Secondary | ICD-10-CM | POA: Diagnosis not present

## 2022-09-27 DIAGNOSIS — T43505A Adverse effect of unspecified antipsychotics and neuroleptics, initial encounter: Secondary | ICD-10-CM | POA: Diagnosis not present

## 2022-09-27 DIAGNOSIS — Z79899 Other long term (current) drug therapy: Secondary | ICD-10-CM | POA: Diagnosis not present

## 2022-09-27 DIAGNOSIS — M8580 Other specified disorders of bone density and structure, unspecified site: Secondary | ICD-10-CM | POA: Diagnosis not present

## 2022-09-27 DIAGNOSIS — F319 Bipolar disorder, unspecified: Secondary | ICD-10-CM | POA: Diagnosis not present

## 2022-09-27 DIAGNOSIS — G2401 Drug induced subacute dyskinesia: Secondary | ICD-10-CM | POA: Diagnosis not present

## 2022-09-28 ENCOUNTER — Encounter: Payer: Self-pay | Admitting: Psychiatry

## 2022-09-28 LAB — LIPID PANEL
Chol/HDL Ratio: 2.3 ratio (ref 0.0–4.4)
Cholesterol, Total: 193 mg/dL (ref 100–199)
HDL: 85 mg/dL (ref >39)
LDL Chol Calc (NIH): 95 mg/dL (ref 0–99)
Triglycerides: 70 mg/dL (ref 0–149)
VLDL Cholesterol Cal: 13 mg/dL (ref 5–40)

## 2022-09-28 LAB — CMP14+EGFR
ALT: 15 IU/L (ref 0–32)
AST: 25 IU/L (ref 0–40)
Albumin/Globulin Ratio: 1.8 (ref 1.2–2.2)
Albumin: 4.5 g/dL (ref 3.9–4.9)
Alkaline Phosphatase: 72 IU/L (ref 44–121)
BUN/Creatinine Ratio: 19 (ref 12–28)
BUN: 18 mg/dL (ref 8–27)
Bilirubin Total: 0.4 mg/dL (ref 0.0–1.2)
CO2: 22 mmol/L (ref 20–29)
Calcium: 9.6 mg/dL (ref 8.7–10.3)
Chloride: 103 mmol/L (ref 96–106)
Creatinine, Ser: 0.96 mg/dL (ref 0.57–1.00)
Globulin, Total: 2.5 g/dL (ref 1.5–4.5)
Glucose: 111 mg/dL — ABNORMAL HIGH (ref 70–99)
Potassium: 4 mmol/L (ref 3.5–5.2)
Sodium: 141 mmol/L (ref 134–144)
Total Protein: 7 g/dL (ref 6.0–8.5)
eGFR: 65 mL/min/{1.73_m2} (ref >59)

## 2022-10-02 IMAGING — DX DG KNEE COMPLETE 4+V*L*
4 series · 4 of 4 positions shown · non-contrast
Comparison: None.

CLINICAL DATA: Atraumatic left knee pain x2 weeks.

EXAM:
LEFT KNEE - COMPLETE 4+ VIEW

[knee ap]
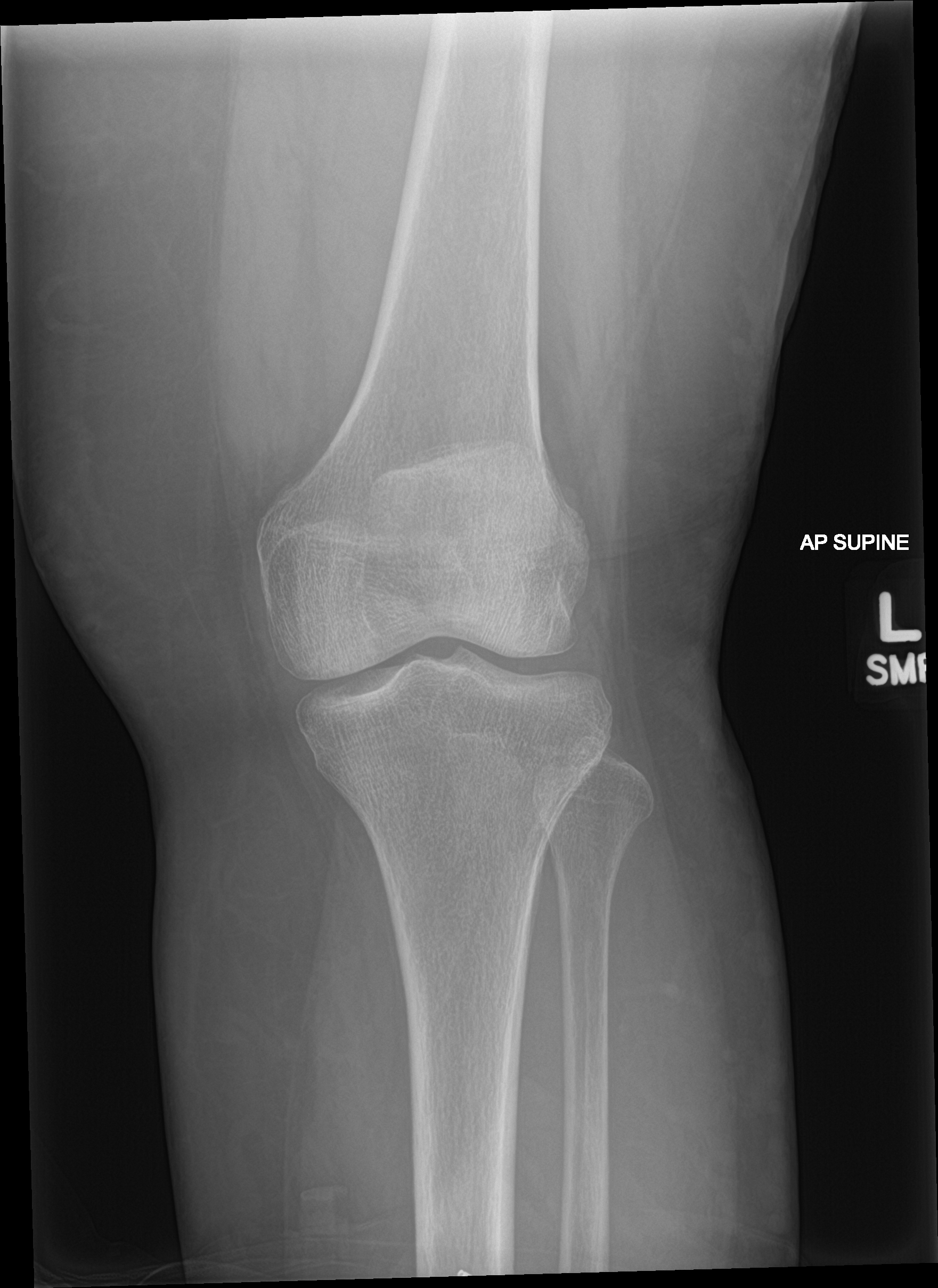

[knee obl (1 of 2)]
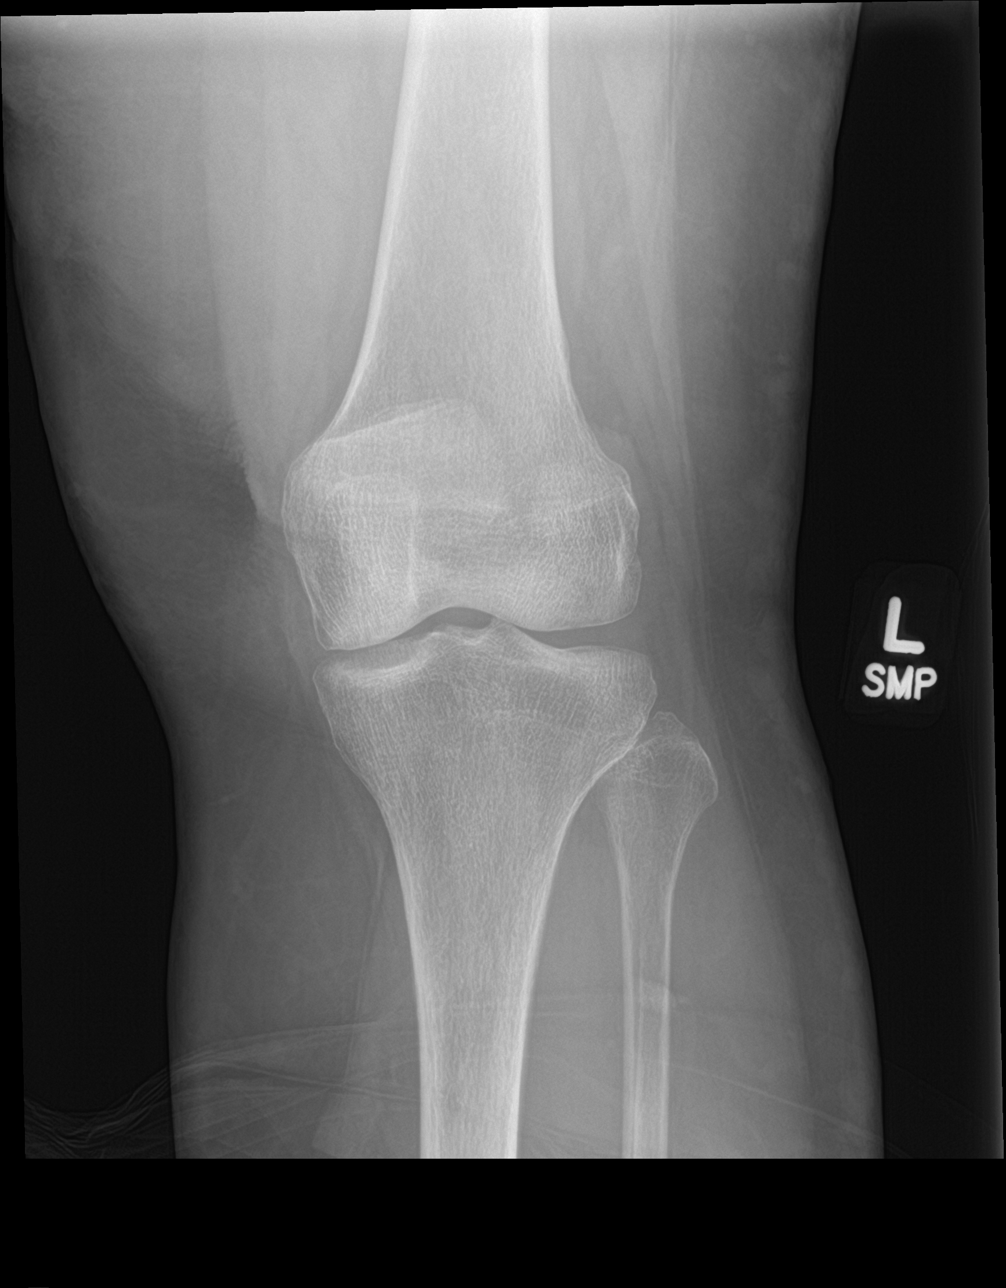

[knee obl (2 of 2)]
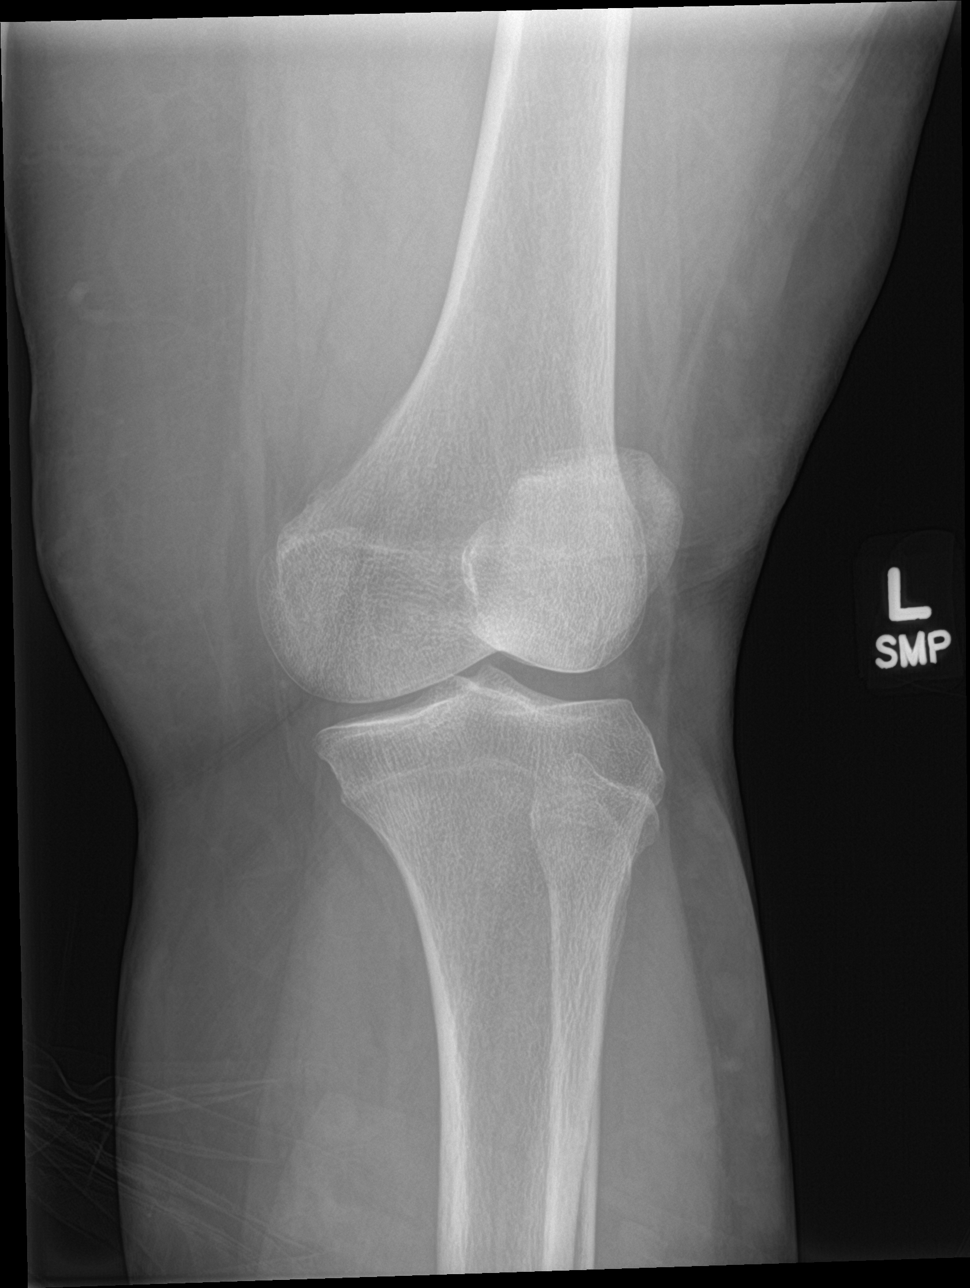

[knee lat]
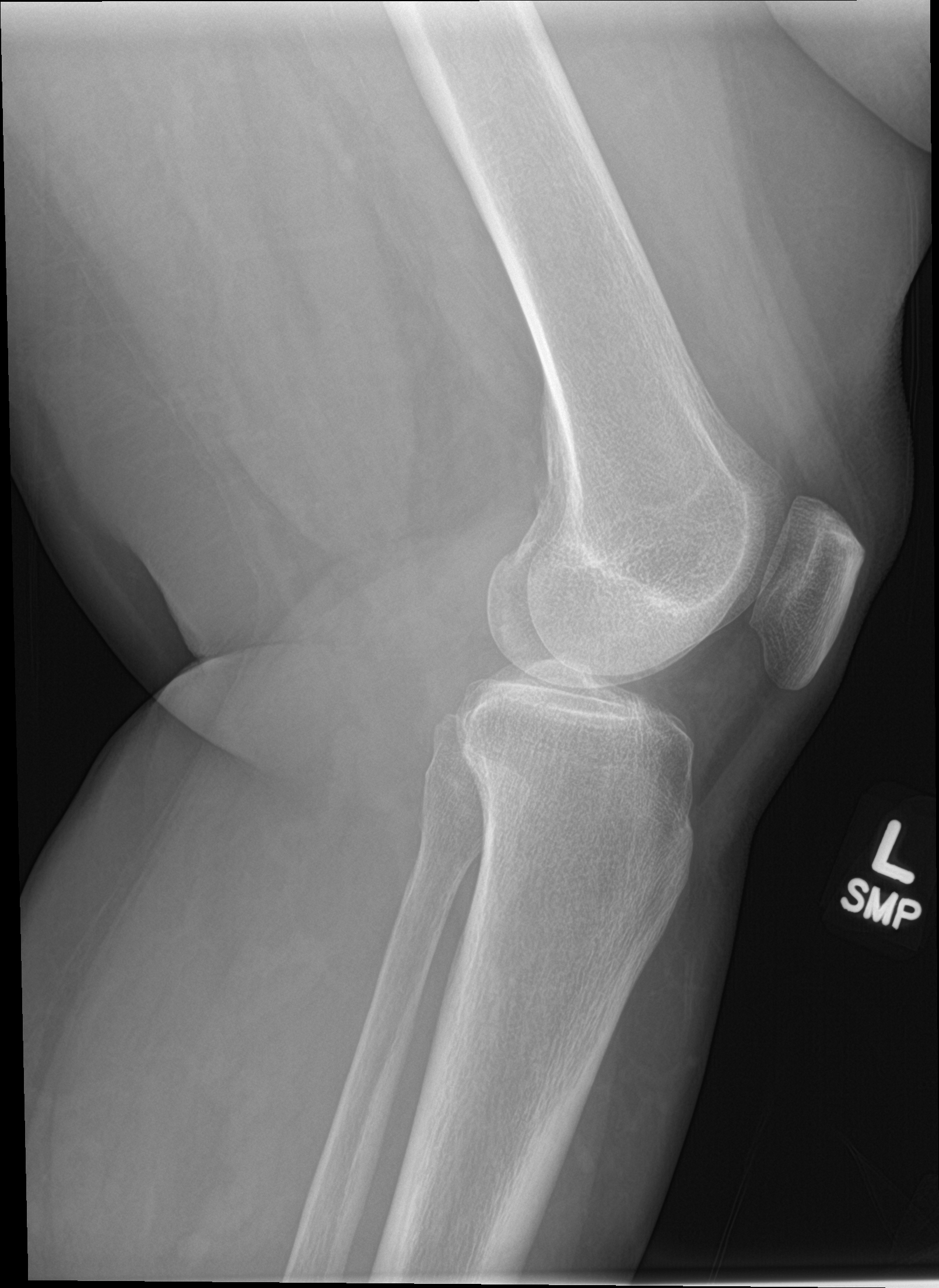

[4 of 4 positions shown; findings below may reference images not displayed]

FINDINGS: No evidence of fracture, dislocation, or joint effusion. No evidence
of arthropathy or other focal bone abnormality. Soft tissues are
unremarkable.
IMPRESSION: Negative.

## 2022-10-03 ENCOUNTER — Ambulatory Visit (INDEPENDENT_AMBULATORY_CARE_PROVIDER_SITE_OTHER): Payer: No Typology Code available for payment source | Admitting: Psychiatry

## 2022-10-03 DIAGNOSIS — F319 Bipolar disorder, unspecified: Secondary | ICD-10-CM

## 2022-10-03 NOTE — Progress Notes (Signed)
[facility] Counselor/Therapist Progress Note  Patient ID: Megan Whitney, MRN: [id number],    Date: 10/03/2022  Time Spent: 55 minutes   Treatment Type: Individual Therapy  Reported Symptoms: anxiety, depression some better, anger, irritability, some confusion at times about "business issues"  Mental Status Exam:  Appearance:   Neat     Behavior:  Appropriate, Sharing, and Motivated  Motor:  Normal  Speech/Language:   Clear and Coherent  Affect:  Anxious, depressed  Mood:  anxious and depressed  Thought process:  goal directed  Thought content:    Obsessive thoughts, rumination  Sensory/Perceptual disturbances:    WNL  Orientation:  oriented to person, place, time/date, situation, day of week, month of year, year, and stated date of Jan. 29, 2024  Attention:  Good  Concentration:  Good  Memory:  Some short term memory issues and Dr. is aware  Fund of knowledge:   Good  Insight:    Good and Fair  Judgment:   Good and Fair  Impulse Control:  Good and Fair   Risk Assessment: Danger to Self:  No Self-injurious Behavior: No Danger to Others: No Duty to Warn:no Physical Aggression / Violence:No  Access to Firearms a concern: No  Gang Involvement:No   Subjective: Patient in today and reporting anxiety mostly related to "business issues at home and someone is buying to build next door." Fears they won't be good neighbors, etc etc, and imagining worst-case scenarios. Did her homework re: anxiety, depression, irritability, and anger, and brought in some documentation to process in session. Was proud of herself and did use some helpful self-talk to encourage herself. Noticed her anxiety is the worst of "my symptom". Focusing more on her fears and negative assumptions "just like my mother". Is increasing her awareness of herself and her behaviors including negative/over-worrying thoughts/behaviors. Has thought a lot more recently about how her mom was and trying to work on some  "negatives" she feels she learned from her mother, especially in interacting with others and feeling guilt, lack of strength, not trusting herself.  States she has felt recently that she is getting stronger. Using journaling, devotions, praying, contacts with friends, all of which are supportive activities for patient.Increased recognition of some of her own self-destructiveness early and her negative reasoning and self-talk. Is knitting which is therapeutic for patient, remaining in contact with supportive friends. Managing anticipatory grief more effectively. Husband and she are doing some things together, playing cards and watching TV, having family over to visit. Husband continues tx for cancer in pill form now. Trying to set healthier limits for herself. Mouth movements caused by prior med, seems to be much less today.  Interventions: Cognitive Behavioral Therapy and Ego-Supportive  Long Term Goal: Reduce overall level, frequency, and intensity of the anxiety so that daily functioning is not impaired. Short Term Goal: 1.Increase understanding of the beliefs and messages that produce the worry and anxiety. Strategies: 1.Help client develop reality-based positive cognitive messages/self-talk. 2. Develop a "coping card" or other reminder which coping strategies are recorded for patient's later use  Diagnosis:   ICD-10-CM   1. Bipolar I disorder with rapid cycling ([place])  F31.9      Plan:  Patient today motivated and actively participating in session as she worked further on her anxiety, depression, irritability, and anger.  Her ability to recognize some of her thoughts as likely being an accurate is increasing and she did some good work today in citing some inaccuracies and realizing  her tendency to always go to the negative thought patterns or to what might be wrong versus what might be right.  Discussed this in several relationships and especially the relationships within the family as she was  able to look at certain examples of where she had assumed people meant their comments as negative or judging and realize later they did not.  Also realizing more some of the past relationship with her mother heavily influence patient and her tendency towards negative thought patterns and leaning more heavily towards assuming the worst.  Wanting to work on this further and did some good work in session today on some examples that came up between her and husband who has cancer, and patient realizing how she cannot change him but can change how she responds to him and how she handles hurt from him.  No tearfulness today and seemed to be feeling better about herself although feeling there is a lot "I need to change".  Better able to see how she has tended to operate out of her hurt and with very skewed views of people and situations at times.  Committed to continue to work on this in sessions. Encouraged patient in her practice of more positive behaviors as discussed in session including: Setting limits with others who are not supportive, believing in herself and her abilities to manage challenging circumstances and recognize the times that she is doing that, focus on making good decisions versus impulsive decisions, remain in contact with supportive friends and family, use of journaling between sessions, taking breaks occasionally from her electronics, finding the strengths and positives within herself, refrain from assuming worst-case scenarios, getting outside daily, spending time with her 2 dogs which is very therapeutic for her, use of positive self talk, and recognize the strengths she shows when working with goal-directed behaviors to move in a direction that supports her improved emotional health and overall wellbeing.  Goal review and progress/challenges noted with patient.  Next appointment within 2 to 3 weeks.  This record has been created using [employer].  Chart creation errors have been sought,  but may not always have been located and corrected.  Such creation errors do not reflect on the standard of medical care provided.   Shanon Ace, LCSW

## 2022-10-06 ENCOUNTER — Other Ambulatory Visit: Payer: Self-pay | Admitting: Psychiatry

## 2022-10-06 DIAGNOSIS — F319 Bipolar disorder, unspecified: Secondary | ICD-10-CM

## 2022-10-10 IMAGING — DX DG TIBIA/FIBULA 2V*L*
4 series · 4 of 4 positions shown · non-contrast
Comparison: Left knee series 09/03/2021. Ankle series 09/02/2019.

CLINICAL DATA: 65-year-old female with pain and swelling in the leg
for 4 days. Painful walking.

EXAM:
LEFT TIBIA AND FIBULA - 2 VIEW

[tib/fib ap (1 of 2)]
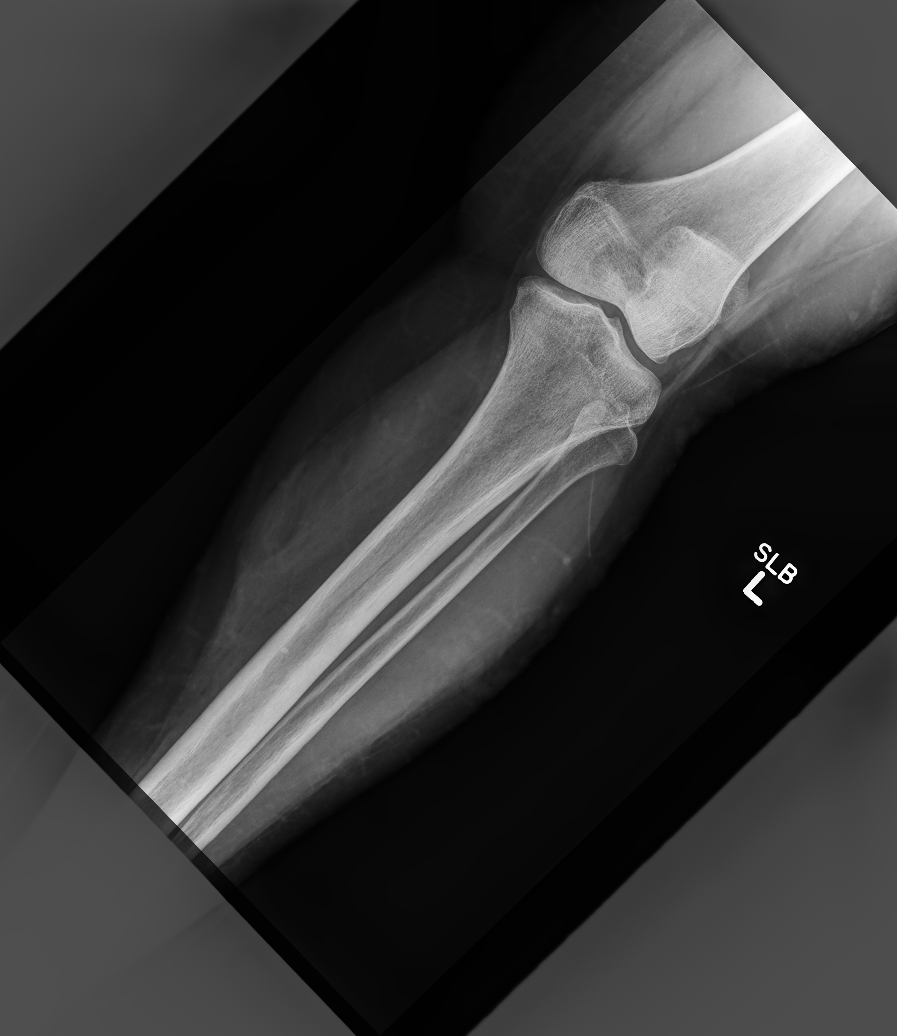

[tib/fib lat (1 of 2)]
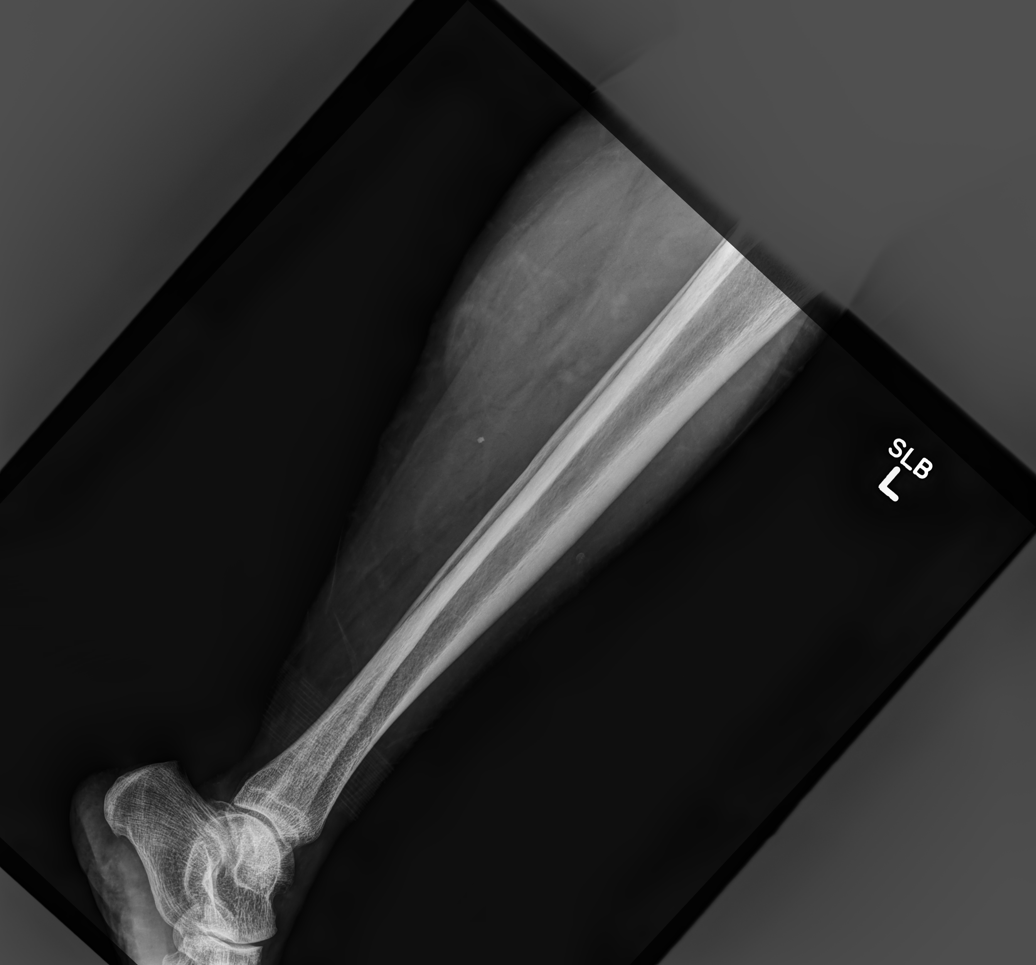

[tib/fib ap (2 of 2)]
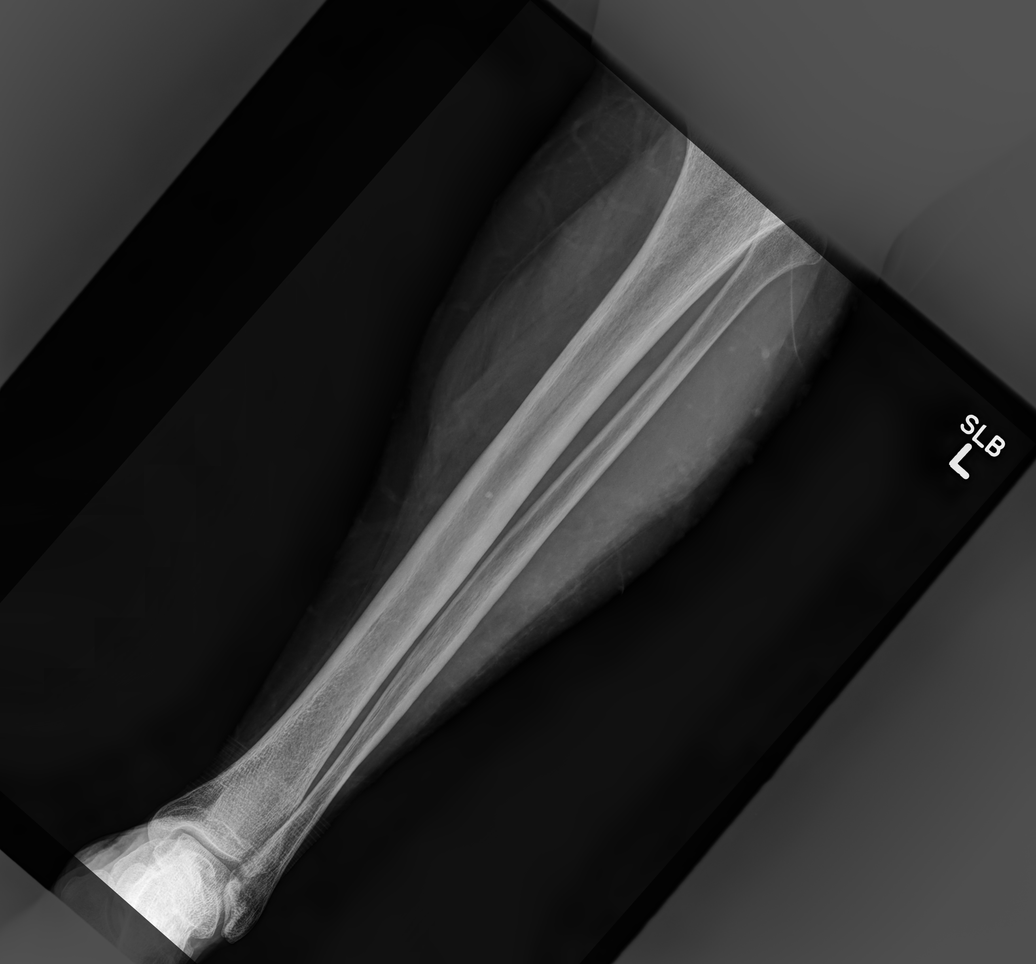

[tib/fib lat (2 of 2)]
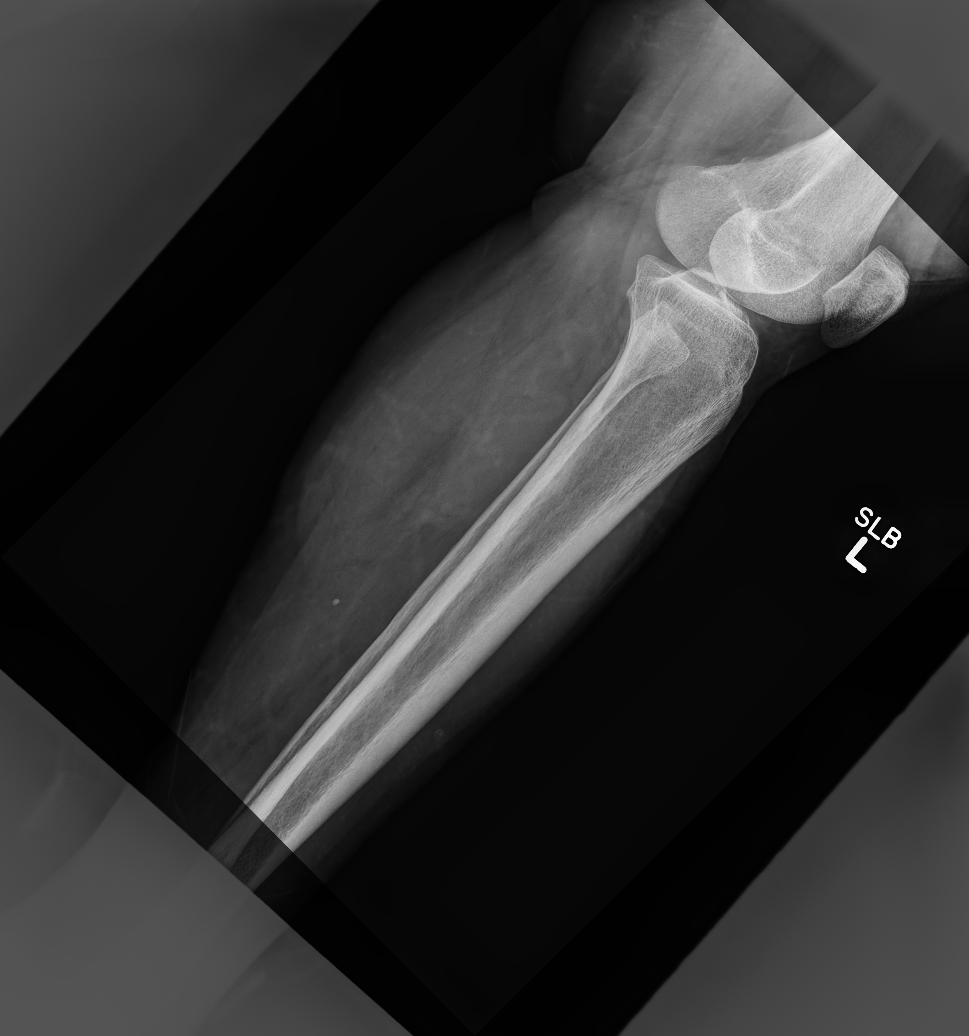

[4 of 4 positions shown; findings below may reference images not displayed]

FINDINGS: Bone mineralization is within normal limits for age. Alignment is
preserved at the left knee and ankle. No joint effusion evident. No
acute osseous abnormality identified.
IMPRESSION: No acute osseous abnormality identified about the left tib-fib.

## 2022-10-11 ENCOUNTER — Encounter: Payer: Self-pay | Admitting: Psychiatry

## 2022-10-11 ENCOUNTER — Ambulatory Visit: Payer: No Typology Code available for payment source | Admitting: Psychiatry

## 2022-10-11 VITALS — BP 133/97 | HR 80

## 2022-10-11 DIAGNOSIS — F900 Attention-deficit hyperactivity disorder, predominantly inattentive type: Secondary | ICD-10-CM | POA: Diagnosis not present

## 2022-10-11 DIAGNOSIS — F5105 Insomnia due to other mental disorder: Secondary | ICD-10-CM | POA: Diagnosis not present

## 2022-10-11 DIAGNOSIS — G3184 Mild cognitive impairment, so stated: Secondary | ICD-10-CM

## 2022-10-11 DIAGNOSIS — R251 Tremor, unspecified: Secondary | ICD-10-CM

## 2022-10-11 DIAGNOSIS — Z79899 Other long term (current) drug therapy: Secondary | ICD-10-CM

## 2022-10-11 DIAGNOSIS — G2401 Drug induced subacute dyskinesia: Secondary | ICD-10-CM

## 2022-10-11 DIAGNOSIS — F319 Bipolar disorder, unspecified: Secondary | ICD-10-CM

## 2022-10-11 DIAGNOSIS — R7989 Other specified abnormal findings of blood chemistry: Secondary | ICD-10-CM

## 2022-10-11 DIAGNOSIS — F411 Generalized anxiety disorder: Secondary | ICD-10-CM | POA: Diagnosis not present

## 2022-10-11 MED ORDER — FLUVOXAMINE MALEATE 25 MG PO TABS: 50.0000 mg | ORAL_TABLET | Freq: Every day | ORAL | 0 refills | Status: DC

## 2022-10-11 NOTE — Patient Instructions (Signed)
Increase fluvoxamine to 2 of the 25 mg tablets at night for anxiety and irritability

## 2022-10-11 NOTE — Progress Notes (Signed)
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 10 HS. SE a alittle dizzy. SE: Still having mouth movements and biting tongue.  Sometimes hard to talk.  Drooling.  When tries to increase clozapine slurred speech and severe dizziness.   Mood is a little better.   Sleeping 8-9 hours.  So much better sleep with clozapine.   Still has anxiety, generalized. Plan: To minimize polypharmacy and improve tolerabilty:  Reduce Equetro to 1 of the 100 mg capsule at night for 1 week, then stop it. Wait 1 week then stop the carbamazepine chewable. Wait 1 more week then reduce clonidine to 1/2 at night for 1 week then stop it. Plan: Started clozapine  and continue 25 mg for now bc hasn't tolerated more so far.  02/08/22 appt noted: Mouth movements and biting tongue.  Sores on cheek with constant chewing movements. Sometimes hard to talk. Hypersalivation gets worse as day progresses. Sometimes balance problems.  Constipation managed.   Sleep very well.  8-9 hours. Off Equetro and on clozapine. Still has depression and anxiety without much change Plan: Started clozapine  and continue 25 mg for now bc  hasn't tolerated more and need to start Ingrezza 40 mg for TD.  03/23/2022 appointment with the following noted: Several phone calls since here.  Has gotten up to clozapine 37.5 mg nightly. Continues Focalin 15 mg twice daily, lamotrigine 200 mg twice daily, lithium 150 nightly. Started Ingrezza 40 mg daily. Ingrezza amazing difference but even with grant of $10000 can't afford it.  Not biting mouth and mouth less sore.  Less mouth movements but not gone Emotionally not real well with anxiety and depression and crying spells.  Also some irritability and anger.  Easily triggered anger. Sleep more broken with Ingrezza HS but 8-9 hours. Can be sedated if gets up early with slurred speech but not if full night sleep. Balance better off Equetro. Plan: Started clozapine but needs to increase bc minimal effect and better tolerance, so increase to 50 mg HS  04/05/22 appt noted: Increased clozapine to 50 mg HS.  Some groggy in the AM.  One day was dizzy.  Otherwise on occasion.  Takes it right before bed.   Still depression, hopeless, irritability and anger.  Some periods of racing thoughts.  Sometimes  recognizes hypomanic episodes and somethimes doesn't recognize. Dog is very sick and H with bone CA.  Not crying on clozapine as much. GERD and needs surgery for hiatal hernia. Signed up for water aerobics. Plan: Started clozapine but needs to increase bc minimal effect and better tolerance, so increase to 75 mg HS (Started clozapine on 12/07/21) Reduce librium 5 mg HS and plan to stop  05/10/22 appt med: TD partially better with Ingrezza 40.  Has  a grant.   Increased clozapine 75 mg HS, reduced Librium to 5 mg HS. Tolerated OK. Depression a little better.  Irritability still high.  Poor memory and easily confused. Sleep is pretty good and is better and needs to sleep longer.   Plan: DC librium Worsening TD partial response on 40 mg Ingrezza, increase to 60 mg daily   Continue clozapine 100 mg  HS  05/17/2022 phone call complaining of sleeping more and feeling sleepy and foggy thinking also dropping some things and drooling.  She was instructed to reduce the clozapine to 75 mg nightly to see if that was the problem. 05/23/2022 phone call asking to increase Ingrezza from 60 to 80 mg daily.  It was agreed. 06/16/2022 phone call stating she had a bad manic episode the week prior and is still feeling excessively sedated.  Also having hand tremors. Instructed to stop lithium and continue clozapine 75 mg nightly.  She is very med sensitive but we have few options left to treat her unstable bipolar disorder. 06/21/2022 phone call: After complaining of excessive sleepiness and excessive sleeping she is now complaining of insomnia.  07/07/22 appt noted: Current psychiatric medications include clozapine 75 mg nightly, Focalin 15 mg twice daily, lamotrigine 100 mg twice daily Ingrezza 80 mg daily.  She stopped lithium Has a list of concerns: SE drooling bad.   Still have mouth movements with the increase Ingrezza 80 mg daily but has stopped the tongue chewing. Goes to bed 9 PM and to sleep in 30 mins and awaken in the AM about 830 and hard to wake up.  Not napping in the day.  Getting enough sleep.   Notices Focalin kicking in when takes it. Mood depressed but not as bad.  Still some irritability, anger.  3 week ago bad manic anger lasting 3-4 days.  Not sure how her sleep was at the time. Some crying and poor impulse control.   PCP wanted 2nd opinion from neuro on ? PD, Dr. Carles Collet Nov 15. Plan: No med changes pending neurologic appointment  07/20/2022 neurology appointment Dr. Wells Guiles Tat.  Diagnosed TD.  Assessment as follows: 1.  Tardive dyskinesia -The patients symptoms are most consistent with tardive dyskinesia.  She has had exposure to typical and atypical antipsychotic medication.  TD is a heterogeneous syndrome depending on a subtle balance between several neurotransmitters in the brain,  including DA receptor blockade and hypersensitivity of DA and GABA receptors. -pt on ingrezza since 02/2022 and both she and notes from Dr. Clovis Pu indicate great benefit.  2.  Tremor             -Largely resolved off of lithium and vraylar (vraylar d/c in 12/2021)             -She has very minor left hand tremor.  Did tell her that Ingrezza can occasionally cause parkinsonism, but I did not see a significant degree of that today.             -I did reassure her today that  I saw no evidence of idiopathic Parkinson's disease.  She was reassured.  3.  Bipolar d/o             -difficult to control per records             -sees Dr. Clovis Pu frequently  4.  Discussed with patient that she really does not need neurologic follow-up at this point in time.  She was happy to hear this.  08/08/2022 appointment noted: No med changes. Still having a lot of anxiety and worries too much. Worse than depression.  No mania since here.  No sig avoidance.   Hard time concentration. Still having irritability and anger. SE drowsy with clozapine in AM and hard to function until about 10 AM.  Takes it about 8 PM and then goes to bed.   No falling but is shuffling more since here.   Sleep 10 hours.    09/07/22 appt noted:   Consistently on clozapine 75 mg HS.  Too drowsy if takes 25 mg in AM. Doing so so .  A lot of anxiety, anger, irritation.   Avg 8-9 hours of sleep and pushes herself to get up .  Hard to function in am until 11 or 12 noon. Ingreza 40 mg BID still some mouth movements and biting tongue.  Is better with Ingrezza but not gone.   SE consitpation and drowsy.   She is aware of the difficulty finding balance between aenough med to manage her sx and not so much to cause intolerable SE.  Disc dosing of her meds. Plan: Augment clozapine with fluvoxamine 25 mg HS  10/11/22 appt noted: : Current psych meds: Clozapine 75 mg nightly, Focalin 15 mg twice daily, fluvoxamine 25 mg nightly, lamotrigine 100 mg twice  daily, Ingrezza 40 mg twice daily No noticeable change with fluvoxamine. Drooling worse in am and stupor until about 11 AM.   Mood still not good , angry and irritable a lot.  H acuses he rof going off her meds.  Less mouth movements and biting tonue. Sleep 9-10 hours. Anxiety still through the roof. Tremor better right now unless weak.   H prostate CA with bone mets and on pain meds.  Past Psychiatric Medication Trials: Vraylar 4.5 SE mouth movements reduced to 3 mg 3/20. It was effective at lower doses for depression.  Worse off it.  Vraylar 1.5 mg every third day led to relapse of significant depression. [place] SE at 42 mg .  Cost problems Latuda 80, , olanzapine, Seroquel, risperidone, Abilify, loxapine 25 mg BID (max tolerated) NR, Clozapine 50  Ingrezza 40 BID  lithium 150 tremor   Trileptal 450, Depakote, Equetro 300 hx balance issues, CBZ ER falling,   Lamictal 200 twice daily,  Focalin,  Ritalin,  fluoxetine 60,  sertraline 100, Wellbutrin history of facial tics, paroxetine cognitive side effects, Lexapro 10 worse buspirone,  ropinirole, amantadine, Sinemet, Artane, Cogentin,  pramipexole 0.5 mg BID helped tremor but caused anger trazodone hangover, Ambien hangover,  Review of Systems:  Review of Systems  HENT:  Positive for dental problem and tinnitus.        Chirping cricket sounds in hears since January 2023 drooling  Cardiovascular:  Negative for chest pain.  Gastrointestinal:  Negative for abdominal pain and nausea.       GERD awakening her  Musculoskeletal:  Positive for arthralgias and gait problem.  Neurological:  Positive for dizziness and tremors. Negative for seizures.       Ankle problems  and balance problems. No falls lately. Occ stumbles. Tremor is better in hands Mouth movements  Psychiatric/Behavioral:  Positive for decreased concentration and dysphoric mood. Negative for agitation, behavioral problems, confusion, hallucinations, self-injury,  sleep disturbance and suicidal ideas. The patient is nervous/anxious. The patient is not hyperactive.   No falls since here. Not currently depressed but unable to remove this from the list.  Medications: I have reviewed the patient's current medications.  Current Outpatient Medications  Medication Sig Dispense Refill   acetaminophen (TYLENOL) 650 MG CR tablet Take 1,300 mg by mouth as needed for pain.     atorvastatin (LIPITOR) 20 MG tablet TAKE (1) TABLET BY MOUTH AT BEDTIME. 90 tablet 0   cloZAPine (CLOZARIL) 25 MG tablet TAKE (3) TABLETS BY MOUTH AT BEDTIME. 21 tablet 0   dexmethylphenidate (FOCALIN) 10 MG tablet Take 1.5 tablets (15 mg total) by mouth 2 (two) times daily. 90 tablet 0   Ferrous Gluconate (IRON 27 PO) Take by mouth.     ketoconazole (NIZORAL) 2 % cream Apply 1 Application topically daily. 60 g 0   lamoTRIgine (LAMICTAL) 200 MG tablet TAKE (1) TABLET BY MOUTH TWICE DAILY. (Patient taking differently: 1/2 BID) 180 tablet 0   Melatonin 10 MG CAPS Take by mouth at bedtime as needed.     pantoprazole (PROTONIX) 40 MG tablet Take 1 tablet (40 mg total) by mouth 2 (two) times daily. 180 tablet 3   valbenazine (INGREZZA) 40 MG capsule Take 1 capsule (40 mg total) by mouth in the morning and at bedtime. 60 capsule 2   fluvoxaMINE (LUVOX) 25 MG tablet Take 2 tablets (50 mg total) by mouth at bedtime. 90 tablet 0   No current facility-administered medications for this visit.    Medication Side Effects: Other: tremor and weight gain.   Dyskinesia appears better  SE bettter than they were.  Balance problems intermittently  Allergies:  Allergies  Allergen Reactions   Azithromycin Anaphylaxis   Penicillins Anaphylaxis    DID THE REACTION INVOLVE: Swelling of the face/tongue/throat, SOB, or low BP? Yes Sudden or severe rash/hives, skin peeling, or the inside of the mouth or nose? Yes Did it require medical treatment? No When did it last happen?       If all above answers are "NO",  may proceed with cephalosporin use.  Patient reacts to Z pack.  HAS Taken amoxicillin fine.   Adhesive [Tape] Other (See Comments)    On bandaids    Past Medical History:  Diagnosis Date   ADD (attention deficit disorder)    Allergy    Seasonal   Anemia    History of GI blood loss   Anxiety    Arthritis    Atrophy of vagina 10/07/2020   Bipolar 1 disorder (HCC)    Cancer ([place])    Colon polyps    Depression    Diabetes mellitus ([place])    pt denies   Edema, lower extremity    Epistaxis    Around 2011 or 2012, required cauterization.    Esophageal stricture    Fracture of superior pubic ramus (HCC) 11/28/2018   GERD (gastroesophageal reflux disease)    Headache(784.0)    Hyperlipidemia    Interstitial cystitis    Joint pain    Lactose intolerance    Lung cancer ([place]) 2002   Neuromuscular disorder ([place])    Obesity    Osteoarthritis    Palpitations    Sleep apnea    Doesn't use a CPAP  Suicidal ideation 01/20/2020   Swallowing difficulty    Tardive dyskinesia     Family History  Problem Relation Age of Onset   Arthritis Mother    Hearing loss Mother    Hyperlipidemia Mother    Hypertension Mother    Depression Mother    Anxiety disorder Mother    Obesity Mother    Sudden death Mother    Hypertension Father    Diabetes Mellitus II Father    Heart disease Father    Arthritis Father    Cancer Father        Brain   COPD Father    Diabetes Father    Hyperlipidemia Father    Sleep apnea Father    Early death Sister        Aneroxia/Bulimic   Depression Brother    Early death Proofreader Accident   Stroke Maternal Grandmother    Hypertension Maternal Grandmother    Arthritis Maternal Grandfather    Heart attack Maternal Grandfather    Hearing loss Maternal Grandfather    Depression Daughter    Drug abuse Daughter    Heart disease Daughter    Hypertension Daughter    Colon cancer Neg Hx    Esophageal cancer Neg Hx    Rectal cancer Neg Hx      Social History   Socioeconomic History   Marital status: Married    Spouse name: Not on file   Number of children: 1   Years of education: 12   Highest education level: Not on file  Occupational History   Occupation: retired  Tobacco Use   Smoking status: Never   Smokeless tobacco: Never  Vaping Use   Vaping Use: Never used  Substance and Sexual Activity   Alcohol use: Yes    Alcohol/week: 1.0 standard drink of alcohol    Types: 1 Glasses of wine per week    Comment: 1 glass wine q few weeks   Drug use: No   Sexual activity: Yes  Other Topics Concern   Not on file  Social History Narrative   Pt lives in [place] with husband Lanny Hurst.  Followed by Dr. Clovis Pu for psychiatry and Rinaldo Cloud for therapy.   Right handed   Drinks caffeine   One story home   Married lives with husband   retired   Investment banker, operational of Radio broadcast assistant Strain: [place]  (05/11/2022)   Overall Financial Resource Strain (CARDIA)    Difficulty of Paying Living Expenses: Not hard at all  Food Insecurity: No Food Insecurity (05/11/2022)   Hunger Vital Sign    Worried About Running Out of Food in the Last Year: Never true    Ran Out of Food in the Last Year: Never true  Transportation Needs: No Transportation Needs (05/11/2022)   PRAPARE - Hydrologist (Medical): No    Lack of Transportation (Non-Medical): No  Physical Activity: Inactive (05/11/2022)   Exercise Vital Sign    Days of Exercise per Week: 0 days    Minutes of Exercise per Session: 0 min  Stress: No Stress Concern Present (05/09/2021)   [place]    Feeling of Stress : Only a little  Social Connections: Moderately Integrated (05/11/2022)   Social Connection and Isolation Panel [NHANES]    Frequency of Communication with Friends and Family: More than three times a week  Frequency of Social Gatherings with Friends and Family: More  than three times a week    Attends Religious Services: More than 4 times per year    Active Member of [employer] or Organizations: No    Attends Archivist Meetings: Never    Marital Status: Married  Human resources officer Violence: Not At Risk (05/09/2021)   Humiliation, Afraid, Rape, and Kick questionnaire    Fear of Current or Ex-Partner: No    Emotionally Abused: No    Physically Abused: No    Sexually Abused: No    Past Medical History, Surgical history, Social history, and Family history were reviewed and updated as appropriate.   Please see review of systems for further details on the patient's review from today.   Objective:   Physical Exam:  BP (!) 133/97   Pulse 80   Physical Exam Constitutional:      General: She is not in acute distress. Musculoskeletal:        General: No deformity.  Neurological:     Mental Status: She is alert and oriented to person, place, and time.     Cranial Nerves: No dysarthria.     Motor: Tremor present.     Coordination: Coordination normal.     Comments: Mild resting R hand rotational tremor better but not gone. Fidgety mildy feet better Some buccolingual tremor is better but residual  Psychiatric:        Attention and Perception: Attention and perception normal. She does not perceive auditory hallucinations.        Mood and Affect: Mood is anxious and depressed. Affect is not labile, blunt or angry.        Speech: Speech normal. Speech is not rapid and pressured.        Behavior: Behavior normal. Behavior is not agitated or slowed. Behavior is cooperative.        Thought Content: Thought content normal. Thought content is not paranoid or delusional. Thought content does not include homicidal or suicidal ideation. Thought content does not include suicidal plan.        Cognition and Memory: Cognition and memory normal.        Judgment: Judgment normal.     Comments: Insight intact more mood cycling and still anxious. Without much  change since last visit. Still anxious Overall mood needs more improvement but depression better with clozapine but not resolved      Lab Review:     Component Value Date/Time   NA 141 09/27/2022 0950   K 4.0 09/27/2022 0950   CL 103 09/27/2022 0950   CO2 22 09/27/2022 0950   GLUCOSE 111 (H) 09/27/2022 0950   GLUCOSE 112 (H) 05/25/2022 1319   BUN 18 09/27/2022 0950   CREATININE 0.96 09/27/2022 0950   CALCIUM 9.6 09/27/2022 0950   PROT 7.0 09/27/2022 0950   ALBUMIN 4.5 09/27/2022 0950   AST 25 09/27/2022 0950   ALT 15 09/27/2022 0950   ALKPHOS 72 09/27/2022 0950   BILITOT 0.4 09/27/2022 0950   GFRNONAA >60 05/25/2022 1319   GFRAA 96 03/02/2020 1433       Component Value Date/Time   WBC 6.3 03/18/2022 1021   WBC 7.4 01/19/2020 1158   RBC 4.50 03/18/2022 1021   RBC 4.19 01/19/2020 1158   HGB 13.0 03/18/2022 1021   HCT 40.8 03/18/2022 1021   PLT 261 03/18/2022 1021   MCV 91 03/18/2022 1021   MCH 28.9 03/18/2022 1021   MCH 30.3 01/19/2020 1158  MCHC 31.9 03/18/2022 1021   MCHC 32.1 01/19/2020 1158   RDW 12.8 03/18/2022 1021   LYMPHSABS 2.1 03/18/2022 1021   MONOABS 0.7 04/04/2019 1004   EOSABS 0.4 03/18/2022 1021   BASOSABS 0.0 03/18/2022 1021  Vitamin D level 33 on 10K units daily on 12/4/`9 Increased to prescription vitamin d 50K units Monday, Wed, Friday.  Rx sent in.   Lithium Lvl  Date Value Ref Range Status  10/21/2018 0.18 (L) 0.60 - 1.20 mmol/L Final    Comment:    Performed at [facility], [address] [place], [place] [address]     No results found for: "PHENYTOIN", "PHENOBARB", "VALPROATE", "CBMZ"   .res Assessment: Plan:    Bipolar I disorder with rapid cycling ([place]) - Plan: fluvoxaMINE (LUVOX) 25 MG tablet  Generalized anxiety disorder  Tardive dyskinesia  Attention deficit hyperactivity disorder (ADHD), predominantly inattentive type  Insomnia due to mental condition  Mild cognitive impairment  Low vitamin D level  Long term  current use of clozapine  Tremor of both hands  Greater than 50% of 30 min face to face time with patient was spent on counseling and coordination of care. We discussed multiple dxes and concerns.   Fairy has chronic rapid cycling bipolar disorder which is chronically unstable and has been difficult to control.  Failed 14 different mood stabilizers.  The rapid cycling is making it difficult to control frequency of depressive episodes and the anxiety as well.  We have typically had to make frequent med changes.   Disc gradual increase bc med sensitivity.  Med sensitivity to SE seems to be the biggest problem preventing better mood control  (Started clozapine on 12/07/21).  Continue clozapine 75 mg PM bc can't tolerate more. Having SE drooling and mild sedation.  Eventually sedation likely to get better. Check CBC with diff every other week until 12/08/22 then monthly  Augment clozapine with fluvoxamine but increase 50mg  HS also to help anxiety, irritability  Ingrezza for TD helped stop tongue chewing but still some mouth movements at 80 mg, which was started 05/23/22.  Shuffling a little more and can't reduce Ingrezza.  Split ingrezza 40 BID  Disc ECT. Only FDA approved option left. However she decided to pursue clozapine and is up to 25 mg for 1 night. Extensive discussion of clozapine dosing recommendations but we will increase more slowly bc she is so med sensitive.Disc risk low WBC, cardiomyopathy, etc, sedation Disc change to  biweekly labs etc and REMS program.    Prone to UTI and may be causing confusion discussed.  Had confirmed UTI recently.  She has a high residual anxiety.  It has been impossible to control all of her symptoms simultaneously without causing side effects. Failed various meds.  Discussed side effects of each medicine. Continue   Focalin IR 15 mg BID DT Cost and off label for depression  Try to spread this out if possible for mood.  She feels this is  necessary  Lamotrigine reduced to  100 mg twice daily to try to reduce polypharmacy and so improve tolerability of clozapine.  consider reduction if clozapine helps.  Failed all reasonable alternatives for anxiety.  Option viibryd  Discussed potential metabolic side effects associated with atypical antipsychotics, as well as potential risk for movement side effects. Advised pt to contact office if movement side effects occur.    Checked B12 folate bc memory complaints.  Normal B12 & folate on 05/25/22  Disc SE meds and this is heightened by  the complication of necessary polypharmacy.  Supportive therapy in terms of dealing with husband's addiction and now new dx metastatic prostate CA.Marland Kitchen Rec exercise as it would help.    Requires frequent FU DT chronic instability.  Wants to schedule monthly.  FU 4 weeks.   Lynder Parents MD, DFAPA  Please see After Visit Summary for patient specific instructions.    Future Appointments  Date Time Provider [place]  10/17/2022 10:00 AM Shanon Ace, LCSW CP-CP None  11/07/2022 10:00 AM Shanon Ace, LCSW CP-CP None  11/09/2022 11:30 AM Cottle, Billey Co., MD CP-CP None  11/28/2022 10:00 AM Shanon Ace, LCSW CP-CP None  12/12/2022 10:00 AM Cottle, Billey Co., MD CP-CP None  12/19/2022 10:00 AM Shanon Ace, LCSW CP-CP None  01/09/2023  9:00 AM Shanon Ace, LCSW CP-CP None  01/09/2023 10:30 AM Cottle, Billey Co., MD CP-CP None  03/27/2023  1:00 PM Lindell Spar, MD R[facility] RPC    No orders of the defined types were placed in this encounter.      -------------------------------

## 2022-10-12 ENCOUNTER — Ambulatory Visit: Payer: No Typology Code available for payment source | Admitting: Psychiatry

## 2022-10-12 DIAGNOSIS — F319 Bipolar disorder, unspecified: Secondary | ICD-10-CM | POA: Diagnosis not present

## 2022-10-12 DIAGNOSIS — Z79899 Other long term (current) drug therapy: Secondary | ICD-10-CM | POA: Diagnosis not present

## 2022-10-13 ENCOUNTER — Other Ambulatory Visit: Payer: Self-pay | Admitting: Psychiatry

## 2022-10-13 ENCOUNTER — Other Ambulatory Visit: Payer: Self-pay | Admitting: Internal Medicine

## 2022-10-13 ENCOUNTER — Encounter: Payer: Self-pay | Admitting: Psychiatry

## 2022-10-13 DIAGNOSIS — E7849 Other hyperlipidemia: Secondary | ICD-10-CM

## 2022-10-13 DIAGNOSIS — F319 Bipolar disorder, unspecified: Secondary | ICD-10-CM

## 2022-10-14 ENCOUNTER — Other Ambulatory Visit: Payer: Self-pay

## 2022-10-14 DIAGNOSIS — F319 Bipolar disorder, unspecified: Secondary | ICD-10-CM

## 2022-10-14 MED ORDER — CLOZAPINE 25 MG PO TABS: ORAL_TABLET | ORAL | 4 refills | Status: DC

## 2022-10-17 ENCOUNTER — Ambulatory Visit: Payer: No Typology Code available for payment source | Admitting: Psychiatry

## 2022-10-18 ENCOUNTER — Other Ambulatory Visit: Payer: Self-pay | Admitting: Psychiatry

## 2022-10-18 DIAGNOSIS — G2401 Drug induced subacute dyskinesia: Secondary | ICD-10-CM

## 2022-10-20 ENCOUNTER — Other Ambulatory Visit: Payer: Self-pay | Admitting: Psychiatry

## 2022-10-20 DIAGNOSIS — F314 Bipolar disorder, current episode depressed, severe, without psychotic features: Secondary | ICD-10-CM

## 2022-10-26 DIAGNOSIS — F319 Bipolar disorder, unspecified: Secondary | ICD-10-CM | POA: Diagnosis not present

## 2022-10-26 DIAGNOSIS — Z79899 Other long term (current) drug therapy: Secondary | ICD-10-CM | POA: Diagnosis not present

## 2022-10-27 ENCOUNTER — Ambulatory Visit: Payer: No Typology Code available for payment source | Admitting: Psychiatry

## 2022-10-27 DIAGNOSIS — F319 Bipolar disorder, unspecified: Secondary | ICD-10-CM

## 2022-10-27 LAB — CBC WITH DIFFERENTIAL/PLATELET
Basophils Absolute: 0 10*3/uL (ref 0.0–0.2)
Basos: 0 %
EOS (ABSOLUTE): 0.2 10*3/uL (ref 0.0–0.4)
Eos: 3 %
Hematocrit: 43 % (ref 34.0–46.6)
Hemoglobin: 13.7 g/dL (ref 11.1–15.9)
Immature Grans (Abs): 0 10*3/uL (ref 0.0–0.1)
Immature Granulocytes: 0 %
Lymphocytes Absolute: 2.2 10*3/uL (ref 0.7–3.1)
Lymphs: 31 %
MCH: 28.5 pg (ref 26.6–33.0)
MCHC: 31.9 g/dL (ref 31.5–35.7)
MCV: 89 fL (ref 79–97)
Monocytes Absolute: 0.7 10*3/uL (ref 0.1–0.9)
Monocytes: 10 %
Neutrophils Absolute: 4 10*3/uL (ref 1.4–7.0)
Neutrophils: 56 %
Platelets: 195 10*3/uL (ref 150–450)
RBC: 4.81 x10E6/uL (ref 3.77–5.28)
RDW: 13.1 % (ref 11.7–15.4)
WBC: 7.1 10*3/uL (ref 3.4–10.8)

## 2022-10-27 NOTE — Progress Notes (Signed)
[facility] Counselor/Therapist Progress Note  Patient ID: Megan Whitney, MRN: [id number],    Date: 10/27/2022  Time Spent: 55 minutes   Treatment Type: Individual Therapy  Reported Symptoms:  anxiety, depression, irritability, fear, worry  Mental Status Exam:  Appearance:   Casual and Neat     Behavior:  Appropriate, Sharing, and Motivated  Motor:  Normal  Speech/Language:   Clear and Coherent  Affect:  Depressed and anxious  Mood:  anxious and depressed  Thought process:  goal directed  Thought content:    Rumination and some obsessive thoughts  Sensory/Perceptual disturbances:    WNL  Orientation:  oriented to person, place, time/date, situation, day of week, month of year, year, and stated date of Feb. 22, 2024  Attention:  Good  Concentration:  Good  Memory:  Some short term memory issues and Dr is aware  Fund of knowledge:   Good  Insight:    Good and Fair  Judgment:   Good  Impulse Control:  Good and Fair   Risk Assessment: Danger to Self:  No Self-injurious Behavior: No Danger to Others: No Duty to Warn:no Physical Aggression / Violence:No  Access to Firearms a concern: No  Gang Involvement:No   Subjective:  Patient in today reporting anxiety, depression, irritability, fear, and worry. Symptoms related mostly to personal, marital, and husband's continued drinking and his cancer diagnosis. Feels anxiety is some better. Wanting to focus more today on her reactions to husband's ongoing drinking. He gets angry and is disrespectful towards patient. She does feel more recently that she has handled "some things better" and not quite as anxious. Discussed her getting back into attending Al-Anon weekly and she is in agreement. She also shares she plans to get back into her weekly [place] group. Staying in touch with friends "[address] [place] [place]", and her knitting group friends. Better self-care and self-talk. Worked really well in session today and noticing some improvement in  her coping with husband's cancer and continued alcohol abuse, "which is also costing [place]. Trying to let go of worries and stay  in the present more. Considering starting a new hobby in doing "quilting" and plans to take a beginner's class. Continues to use prayer, journaling,devotions, knitting group, and friends as healthy support options.   Interventions: Cognitive Behavioral Therapy and Ego-Supportive  Long Term Goal: Reduce overall level, frequency, and intensity of the anxiety so that daily functioning is not impaired. Short Term Goal: 1.Increase understanding of the beliefs and messages that produce the worry and anxiety. Strategies: 1.Help client develop reality-based positive cognitive messages/self-talk. 2. Develop a "coping card" or other reminder which coping strategies are recorded for patient's later use  Diagnosis:   ICD-10-CM   1. Bipolar I disorder with rapid cycling ([place])  F31.9      Plan:  Patient today participating well in session focusing on her anxiety, fears, worries, and depression, all of which she feels contributes to her irritability. Improved insight into some of her home/marital challenges. Reaching out to get involved again in Al-Anon group and [place] and maybe a beginning quilting class she is interested in taking. Does struggle with anxiety and "trying to not let it have as much power with her." Does better when she is involved with people or activities. More motivated today.No tearfulness. Seems a little more optimistic during session. Realizing some of her worrisome thoughts are inaccurate and working to let go of them. Some increase in personal strength and thinking situations through versus  assuming worst-case scenarios. Not operating as much today out of her hurt, and showing some increased strength and perseverance. Encouraged patient and practicing more self affirming and positive behaviors as discussed in session including: Believing in herself and her abilities to  manage challenging circumstances and recognize that's that she is doing that, focus on making good decisions versus impulsive decisions, remain in contact with supportive friends and family, use of journaling between sessions, taking breaks occasionally from her electronics, setting limits with others who are not supportive, finding the strengths and positives within herself, refrain from assuming worst-case scenarios, getting outside some each day, spending time with her 2 dogs which is very therapeutic for her, use of positive self talk, and realize the strength she shows when working with goal-directed behaviors to move in a direction that supports her improved emotional health and overall wellbeing.  Goal review and progress/challenges noted with patient.  Next appt. within 2 to 3 weeks.  This record has been created using [employer].  Chart creation errors have been sought, but may not always have been located and corrected.  Such creation errors do not reflect on the standard of medical care provided.   Shanon Ace, LCSW

## 2022-11-07 ENCOUNTER — Other Ambulatory Visit: Payer: Self-pay | Admitting: Psychiatry

## 2022-11-07 ENCOUNTER — Ambulatory Visit (INDEPENDENT_AMBULATORY_CARE_PROVIDER_SITE_OTHER): Payer: No Typology Code available for payment source | Admitting: Psychiatry

## 2022-11-07 DIAGNOSIS — F3132 Bipolar disorder, current episode depressed, moderate: Secondary | ICD-10-CM | POA: Diagnosis not present

## 2022-11-07 DIAGNOSIS — F319 Bipolar disorder, unspecified: Secondary | ICD-10-CM

## 2022-11-07 NOTE — Progress Notes (Signed)
[facility] Counselor/Therapist Progress Note  Patient ID: Megan Whitney, MRN: [id number],    Date: 11/07/2022  Time Spent: 55 minutes   Treatment Type: Individual Therapy  Reported Symptoms:  "bipolar" depression, anxiety, irritability, fearful "of things in general and especially with husband"  Mental Status Exam:  Appearance:   Casual     Behavior:  Appropriate, Sharing, and Motivated  Motor:  Normal  Speech/Language:   Clear and Coherent  Affect:  Depressed  Mood:  anxious, depressed, and sad  Thought process:  goal directed  Thought content:    Rumination  Sensory/Perceptual disturbances:    WNL  Orientation:  oriented to person, place, time/date, situation, day of week, month of year, year, and stated date of November 07, 2022  Attention:  Good  Concentration:  Good and Fair  Memory:  Some short term memory issues  Fund of knowledge:   Good  Insight:    Good and Fair  Judgment:   Good and Fair  Impulse Control:  Good and Fair   Risk Assessment: Danger to Self:  No Self-injurious Behavior: No Danger to Others: No Duty to Warn:no Physical Aggression / Violence:No  Access to Firearms a concern: No  Gang Involvement:No   Subjective:   Patient in for session today reporting anxiety, depression, worry all related to "husband's drinking on top of having terminal cancer", uncertainties in life, concerned about social contacts and "I need to be better about getting involved." "I really think I am some better." Shared with patient some of the strengths she shows. "Anxiety overall is some better as evidenced by my choice of not letting husband pull me into an argument as they don't go well, able to set better limits with others, but I don't feel as controlled by my anxiety." Reviewed what has helped her reach this point.  Attends Al-Anon and [employer] regularly. Also attend Bible Study Wednesday mornings. Is finding ways to "branch out with more people and activities." Improvements  noted in her self-talk with examples shared. Continued work with negative self-talk and did well with that in session today. Following through in working to decreasing her worrying and continued this in session today. Tries to stay in the present more. Uses prayer, journaling, devotions "periodically", and being in touch with friends.   Interventions: Cognitive Behavioral Therapy and Ego-Supportive  Long Term Goal: Reduce overall level, frequency, and intensity of the anxiety so that daily functioning is not impaired. Short Term Goal: 1.Increase understanding of the beliefs and messages that produce the worry and anxiety. Strategies: 1.Help client develop reality-based positive cognitive messages/self-talk. 2. Develop a "coping card" or other reminder which coping strategies are recorded for patient's later use   Diagnosis:   ICD-10-CM   1. Bipolar disorder with moderate depression ([place])  F31.32      Plan:   Patient in today showing good participation in session as she worked further on her worries, fears, anxiety, and depression. Overall however, she is feeling less helpless and some increased security, less fearfulness although some fears remain. States the hardest thing for her is maintaining my sense of groundedness, which we discussed together prior to session end. Encouraged her continued involvement in Al-Anon group, knitting group, [employer] group, and Bible study group. No tearfulness. Showing more strength. Smiling more. Considering going back to the "Y". Encouraged patient in her practice of more positive and self affirming behaviors as discussed in session including: Believing in herself and her abilities to manage challenging circumstances,  focus on making good decisions versus impulsive decisions, stay in contact with supportive friends and family, use of journaling between sessions, taking breaks occasionally from her electronics, setting limits with others who are not supportive, finding  the strengths and positives within herself, refrain from assuming worst-case scenarios, getting outside some each day, spending time with her 2 dogs which is very therapeutic for her, positive self talk, and recognize the strengths she shows when working with goal-directed behaviors to move in a direction that supports her improved emotional health and overall wellbeing.  Self-Rating Scale: 1-10 Depression Scale- 5 (states it was a 7 last session) 1-10 Anxiety Scale- 5  Goal review and progress/challenges noted with patient.  Next appointment within2-3 weeks.  This record has been created using [employer].  Chart creation errors have been sought, but may not always have been located and corrected.  Such creation errors do not reflect on the standard of medical care provided.   Shanon Ace, LCSW

## 2022-11-08 ENCOUNTER — Ambulatory Visit
Admission: EM | Admit: 2022-11-08 | Discharge: 2022-11-08 | Disposition: A | Discharge status: Home / Self Care | Payer: No Typology Code available for payment source | Attending: Family Medicine | Admitting: Family Medicine

## 2022-11-08 DIAGNOSIS — J069 Acute upper respiratory infection, unspecified: Secondary | ICD-10-CM | POA: Diagnosis not present

## 2022-11-08 MED ORDER — GUAIFENESIN ER 600 MG PO TB12: 600.0000 mg | ORAL_TABLET | Freq: Two times a day (BID) | ORAL | 0 refills | Status: DC | PRN

## 2022-11-08 MED ORDER — PROMETHAZINE-DM 6.25-15 MG/5ML PO SYRP: 5.0000 mL | ORAL_SOLUTION | Freq: Four times a day (QID) | ORAL | 0 refills | Status: DC | PRN

## 2022-11-08 MED ORDER — ALBUTEROL SULFATE HFA 108 (90 BASE) MCG/ACT IN AERS: 2.0000 | INHALATION_SPRAY | RESPIRATORY_TRACT | 0 refills | Status: DC | PRN

## 2022-11-08 NOTE — ED Triage Notes (Addendum)
Pt reports she has sneezing, coughing, runny nose, headache and sore throat x 3 days. Took [facility] brand antihistamines but no relief.

## 2022-11-08 NOTE — Telephone Encounter (Signed)
Has appt with Dr. Clovis Pu 3/6

## 2022-11-08 NOTE — ED Provider Notes (Signed)
[facility]    CSN: [id number] Arrival date & time: 11/08/22  1050      History   Chief Complaint No chief complaint on file.   HPI Megan Whitney is a 67 y.o. female.   Patient presenting today with 3-day history of rhinorrhea, cough, headache, scratchy throat, sneezing.  States the cough significantly worsened last night.  Denies fever, chills, chest pain, shortness of breath, abdominal pain, nausea vomiting or diarrhea.  Trying antihistamines with no relief.  History of seasonal allergies, no known chronic pulmonary disease.    Past Medical History:  Diagnosis Date   ADD (attention deficit disorder)    Allergy    Seasonal   Anemia    History of GI blood loss   Anxiety    Arthritis    Atrophy of vagina 10/07/2020   Bipolar 1 disorder (HCC)    Cancer ([place])    Colon polyps    Depression    Diabetes mellitus ([place])    pt denies   Edema, lower extremity    Epistaxis    Around 2011 or 2012, required cauterization.    Esophageal stricture    Fracture of superior pubic ramus (HCC) 11/28/2018   GERD (gastroesophageal reflux disease)    Headache(784.0)    Hyperlipidemia    Interstitial cystitis    Joint pain    Lactose intolerance    Lung cancer ([place]) 2002   Neuromuscular disorder ([place])    Obesity    Osteoarthritis    Palpitations    Sleep apnea    Doesn't use a CPAP   Suicidal ideation 01/20/2020   Swallowing difficulty    Tardive dyskinesia     Patient Active Problem List   Diagnosis Date Noted   Chronic thoracic back pain 09/22/2022   Neuroleptic-induced tardive dyskinesia 07/20/2022   Intertrigo 05/19/2022   Encounter for general adult medical examination with abnormal findings 03/22/2022   Arthritis of knee 08/17/2021   Polyp of colon 02/04/2021   Dyskinesia 11/22/2020   Parkinsonism 11/22/2020   Seasonal allergies 10/07/2020   Osteopenia 10/07/2020   Gait disturbance 10/07/2020   Hyperlipidemia 06/11/2020   Sleep apnea 06/11/2020    Stricture and stenosis of esophagus 05/16/2012   Hiatal hernia 05/16/2012   Dysphagia, unspecified(787.20) 05/15/2012   Depression    Bipolar disorder ([place])    GERD (gastroesophageal reflux disease) 09/14/2010   Personal history of colonic polyps 09/14/2010    Past Surgical History:  Procedure Laterality Date   BALLOON DILATION  05/16/2012   Procedure: BALLOON DILATION;  Surgeon: Inda Castle, MD;  Location: [place];  Service: Endoscopy;  Laterality: N/A;   BUNIONECTOMY  2011   COLONOSCOPY     ENTEROSCOPY  05/16/2012   Procedure: ENTEROSCOPY;  Surgeon: Inda Castle, MD;  Location: [place];  Service: Endoscopy;  Laterality: N/A;   JOINT REPLACEMENT     right shoulder durgery 25 yrs ago  [place] Bilateral 2006, 2008   bilateral   [place] RESECTION  2002   lung cancer    OB History     Gravida  1   Para      Term      Preterm      AB      Living         SAB      IAB      Ectopic      Multiple  Live Births               Home Medications    Prior to Admission medications   Medication Sig Start Date End Date Taking? Authorizing Provider  albuterol (VENTOLIN HFA) 108 (90 Base) MCG/ACT inhaler Inhale 2 puffs into the lungs every 4 (four) hours as needed for wheezing or shortness of breath. 11/08/22  Yes Volney American, PA-C  guaiFENesin (MUCINEX) 600 MG 12 hr tablet Take 1 tablet (600 mg total) by mouth 2 (two) times daily as needed. 11/08/22  Yes Volney American, PA-C  promethazine-dextromethorphan (PROMETHAZINE-DM) 6.25-15 MG/5ML syrup Take 5 mLs by mouth 4 (four) times daily as needed. 11/08/22  Yes Volney American, PA-C  acetaminophen (TYLENOL) 650 MG CR tablet Take 1,300 mg by mouth as needed for pain.    [provider]  atorvastatin (LIPITOR) 20 MG tablet TAKE (1) TABLET BY MOUTH AT BEDTIME. 10/13/22   Lindell Spar, MD  cloZAPine (CLOZARIL) 25 MG tablet TAKE (3)  TABLETS BY MOUTH AT BEDTIME. 10/14/22   Cottle, Billey Co., MD  Ferrous Gluconate (IRON 27 PO) Take by mouth.    [provider]  fluvoxaMINE (LUVOX) 25 MG tablet Take 2 tablets (50 mg total) by mouth at bedtime. 10/11/22   Cottle, Billey Co., MD  FOCALIN 10 MG tablet Take 1.5 tablets (15 mg total) by mouth 2 (two) times daily. 10/20/22   Cottle, Billey Co., MD  INGREZZA 40 MG capsule TAKE 1 CAPSULE BY MOUTH EVERY MORNING & TAKE 1 CAPSULE BY MOUTH AT BEDTIME 10/19/22   Cottle, Billey Co., MD  ketoconazole (NIZORAL) 2 % cream Apply 1 Application topically daily. 07/13/22   Lindell Spar, MD  lamoTRIgine (LAMICTAL) 200 MG tablet TAKE (1) TABLET BY MOUTH TWICE DAILY. Patient taking differently: 1/2 BID 06/01/22   Cottle, Billey Co., MD  Melatonin 10 MG CAPS Take by mouth at bedtime as needed.    [provider]  pantoprazole (PROTONIX) 40 MG tablet Take 1 tablet (40 mg total) by mouth 2 (two) times daily. 02/02/22   Irene Shipper, MD    Family History Family History  Problem Relation Age of Onset   Arthritis Mother    Hearing loss Mother    Hyperlipidemia Mother    Hypertension Mother    Depression Mother    Anxiety disorder Mother    Obesity Mother    Sudden death Mother    Hypertension Father    Diabetes Mellitus II Father    Heart disease Father    Arthritis Father    Cancer Father        Brain   COPD Father    Diabetes Father    Hyperlipidemia Father    Sleep apnea Father    Early death Sister        Aneroxia/Bulimic   Depression Brother    Early death Proofreader Accident   Stroke Maternal Grandmother    Hypertension Maternal Grandmother    Arthritis Maternal Grandfather    Heart attack Maternal Grandfather    Hearing loss Maternal Grandfather    Depression Daughter    Drug abuse Daughter    Heart disease Daughter    Hypertension Daughter    Colon cancer Neg Hx    Esophageal cancer Neg Hx    Rectal cancer Neg Hx     Social History Social  History   Tobacco Use   Smoking  status: Never   Smokeless tobacco: Never  Vaping Use   Vaping Use: Never used  Substance Use Topics   Alcohol use: Yes    Alcohol/week: 1.0 standard drink of alcohol    Types: 1 Glasses of wine per week    Comment: 1 glass wine q few weeks   Drug use: No     Allergies   Azithromycin, Penicillins, and Adhesive [tape]   Review of Systems Review of Systems Per HPI  Physical Exam Triage Vital Signs ED Triage Vitals  Enc Vitals Group     BP 11/08/22 1123 112/73     Pulse Rate 11/08/22 1123 (!) 102     Resp 11/08/22 1123 20     Temp 11/08/22 1123 99 F (37.2 C)     Temp Source 11/08/22 1123 Oral     SpO2 11/08/22 1123 96 %     Weight --      Height --      Head Circumference --      Peak Flow --      Pain Score 11/08/22 1134 0     Pain Loc --      Pain Edu? --      Excl. in [place]? --    No data found.  Updated Vital Signs BP 112/73 (BP Location: Right Arm)   Pulse (!) 102   Temp 99 F (37.2 C) (Oral)   Resp 20   SpO2 96%   Visual Acuity Right Eye Distance:   Left Eye Distance:   Bilateral Distance:    Right Eye Near:   Left Eye Near:    Bilateral Near:     Physical Exam Vitals and nursing note reviewed.  Constitutional:      Appearance: Normal appearance.  HENT:     Head: Atraumatic.     Right Ear: Tympanic membrane and external ear normal.     Left Ear: Tympanic membrane and external ear normal.     Nose: Rhinorrhea present.     Mouth/Throat:     Mouth: Mucous membranes are moist.     Pharynx: Posterior oropharyngeal erythema present.  Eyes:     Extraocular Movements: Extraocular movements intact.     Conjunctiva/sclera: Conjunctivae normal.  Cardiovascular:     Rate and Rhythm: Normal rate and regular rhythm.     Heart sounds: Normal heart sounds.  Pulmonary:     Effort: Pulmonary effort is normal.     Breath sounds: Normal breath sounds. No wheezing or rales.  Musculoskeletal:        General: Normal range  of motion.     Cervical back: Normal range of motion and neck supple.  Skin:    General: Skin is warm and dry.  Neurological:     Mental Status: She is alert and oriented to person, place, and time.  Psychiatric:        Mood and Affect: Mood normal.        Thought Content: Thought content normal.    UC Treatments / Results  Labs (all labs ordered are listed, but only abnormal results are displayed) Labs Reviewed - No data to display  EKG   Radiology No results found.  Procedures Procedures (including critical care time)  Medications Ordered in UC Medications - No data to display  Initial Impression / Assessment and Plan / UC Course  I have reviewed the triage vital signs and the nursing notes.  Pertinent labs & imaging results that were available during my care of  the patient were reviewed by me and considered in my medical decision making (see chart for details).     Consistent with viral upper respiratory infection.  Treat symptomatically with albuterol inhaler, Phenergan DM, Mucinex, supportive over-the-counter medications and home care.  Return for worsening symptoms.  Declines viral testing today.  Final Clinical Impressions(s) / UC Diagnoses   Final diagnoses:  Viral URI with cough   Discharge Instructions   None    ED Prescriptions     Medication Sig Dispense Auth. Provider   albuterol (VENTOLIN HFA) 108 (90 Base) MCG/ACT inhaler Inhale 2 puffs into the lungs every 4 (four) hours as needed for wheezing or shortness of breath. [place], [place]   promethazine-dextromethorphan (PROMETHAZINE-DM) 6.25-15 MG/5ML syrup Take 5 mLs by mouth 4 (four) times daily as needed. 100 mL Volney American, PA-C   guaiFENesin (MUCINEX) 600 MG 12 hr tablet Take 1 tablet (600 mg total) by mouth 2 (two) times daily as needed. 20 tablet Volney American, [place]      PDMP not reviewed this encounter.   Volney American, [place] 11/08/22 1215

## 2022-11-09 ENCOUNTER — Encounter: Payer: Self-pay | Admitting: Internal Medicine

## 2022-11-09 ENCOUNTER — Encounter: Payer: Self-pay | Admitting: Psychiatry

## 2022-11-09 ENCOUNTER — Telehealth: Payer: No Typology Code available for payment source | Admitting: Psychiatry

## 2022-11-09 DIAGNOSIS — G2401 Drug induced subacute dyskinesia: Secondary | ICD-10-CM

## 2022-11-09 DIAGNOSIS — R7989 Other specified abnormal findings of blood chemistry: Secondary | ICD-10-CM | POA: Diagnosis not present

## 2022-11-09 DIAGNOSIS — F319 Bipolar disorder, unspecified: Secondary | ICD-10-CM | POA: Diagnosis not present

## 2022-11-09 DIAGNOSIS — R251 Tremor, unspecified: Secondary | ICD-10-CM

## 2022-11-09 DIAGNOSIS — F900 Attention-deficit hyperactivity disorder, predominantly inattentive type: Secondary | ICD-10-CM

## 2022-11-09 DIAGNOSIS — F3132 Bipolar disorder, current episode depressed, moderate: Secondary | ICD-10-CM | POA: Diagnosis not present

## 2022-11-09 DIAGNOSIS — F411 Generalized anxiety disorder: Secondary | ICD-10-CM | POA: Diagnosis not present

## 2022-11-09 DIAGNOSIS — G3184 Mild cognitive impairment, so stated: Secondary | ICD-10-CM | POA: Diagnosis not present

## 2022-11-09 DIAGNOSIS — Z79899 Other long term (current) drug therapy: Secondary | ICD-10-CM

## 2022-11-09 DIAGNOSIS — F5105 Insomnia due to other mental disorder: Secondary | ICD-10-CM

## 2022-11-09 MED ORDER — LAMOTRIGINE 100 MG PO TABS: 100.0000 mg | ORAL_TABLET | Freq: Two times a day (BID) | ORAL | 0 refills | Status: DC

## 2022-11-09 MED ORDER — FLUVOXAMINE MALEATE 25 MG PO TABS: 50.0000 mg | ORAL_TABLET | Freq: Every day | ORAL | 0 refills | Status: DC

## 2022-11-09 NOTE — Progress Notes (Signed)
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 instructed to stop Vraylar. Taken clozapine 25 mg once last night. Experiencing more depression.  Anxiety out the roof.  Anger.  Sleep is good and better with clozapine.9-10 hours. Rough 3 weeks.  Mixed sx with depression the main one. SE drooling. Mouth movements, she doesn't want to add another med right now. Tremor a lot better off Vraylar, almost none.   Saw neuro and pending sleep study. Plan: Clonidine off label for irritability and anxiety 0.05 mg BID Increase clonidine to 1/2 tablet twice a day for a week.    12/16/2021 phone call from patient's husband concerned that she is grinding her teeth and slurring her words.  She had started clozapine taking 25 mg tablets 1-1/2 nightly and she was instructed to reduce the dose to 25 mg nightly  01/04/2022 appointment with the following noted: Off Vraylar and on clozapine 25 mg HS.  Continues Equetro 200, CBZ 100, Lamotrigine 200 BID, lithium 150 HS, clonidine 0.1 mg 1/2 in AM and 1 at night, Librium 10 HS. SE a alittle dizzy. SE: Still having mouth movements and biting tongue.  Sometimes hard  to talk.  Drooling.  When tries to increase clozapine slurred speech and severe dizziness.   Mood is a little better.   Sleeping 8-9 hours.  So much better sleep with clozapine.   Still has anxiety, generalized. Plan: To minimize polypharmacy and improve tolerabilty:  Reduce Equetro to 1 of the 100 mg capsule at night for 1 week, then stop it. Wait 1 week then stop the carbamazepine chewable. Wait 1 more week then reduce clonidine to 1/2 at night for 1 week then stop it. Plan: Started clozapine  and continue 25 mg for now bc hasn't tolerated more so far.  02/08/22 appt noted: Mouth movements and biting tongue.  Sores on cheek with constant chewing movements. Sometimes hard to talk. Hypersalivation gets worse as day progresses. Sometimes balance problems.  Constipation managed.   Sleep very well.  8-9 hours. Off Equetro and on clozapine. Still has depression and anxiety without much change Plan: Started clozapine  and continue 25 mg for now bc hasn't tolerated more and need to start Ingrezza 40 mg for TD.  03/23/2022 appointment with the following noted: Several phone calls since here.  Has gotten up to  clozapine 37.5 mg nightly. Continues Focalin 15 mg twice daily, lamotrigine 200 mg twice daily, lithium 150 nightly. Started Ingrezza 40 mg daily. Ingrezza amazing difference but even with grant of $10000 can't afford it.  Not biting mouth and mouth less sore.  Less mouth movements but not gone Emotionally not real well with anxiety and depression and crying spells.  Also some irritability and anger.  Easily triggered anger. Sleep more broken with Ingrezza HS but 8-9 hours. Can be sedated if gets up early with slurred speech but not if full night sleep. Balance better off Equetro. Plan: Started clozapine but needs to increase bc minimal effect and better tolerance, so increase to 50 mg HS  04/05/22 appt noted: Increased clozapine to 50 mg HS.  Some groggy in the AM.  One day was dizzy.   Otherwise on occasion.  Takes it right before bed.   Still depression, hopeless, irritability and anger.  Some periods of racing thoughts.  Sometimes recognizes hypomanic episodes and somethimes doesn't recognize. Dog is very sick and H with bone CA.  Not crying on clozapine as much. GERD and needs surgery for hiatal hernia. Signed up for water aerobics. Plan: Started clozapine but needs to increase bc minimal effect and better tolerance, so increase to 75 mg HS (Started clozapine on 12/07/21) Reduce librium 5 mg HS and plan to stop  05/10/22 appt med: TD partially better with Ingrezza 40.  Has  a grant.   Increased clozapine 75 mg HS, reduced Librium to 5 mg HS. Tolerated OK. Depression a little better.  Irritability still high.  Poor memory and easily confused. Sleep is pretty good and is better and needs to sleep longer.   Plan: DC librium Worsening TD partial response on 40 mg Ingrezza, increase to 60 mg daily   Continue clozapine 100 mg HS  05/17/2022 phone call complaining of sleeping more and feeling sleepy and foggy thinking also dropping some things and drooling.  She was instructed to reduce the clozapine to 75 mg nightly to see if that was the problem. 05/23/2022 phone call asking to increase Ingrezza from 60 to 80 mg daily.  It was agreed. 06/16/2022 phone call stating she had a bad manic episode the week prior and is still feeling excessively sedated.  Also having hand tremors. Instructed to stop lithium and continue clozapine 75 mg nightly.  She is very med sensitive but we have few options left to treat her unstable bipolar disorder. 06/21/2022 phone call: After complaining of excessive sleepiness and excessive sleeping she is now complaining of insomnia.  07/07/22 appt noted: Current psychiatric medications include clozapine 75 mg nightly, Focalin 15 mg twice daily, lamotrigine 100 mg twice daily Ingrezza 80 mg daily.  She stopped lithium Has a list of concerns: SE drooling bad.    Still have mouth movements with the increase Ingrezza 80 mg daily but has stopped the tongue chewing. Goes to bed 9 PM and to sleep in 30 mins and awaken in the AM about 830 and hard to wake up.  Not napping in the day.  Getting enough sleep.   Notices Focalin kicking in when takes it. Mood depressed but not as bad.  Still some irritability, anger.  3 week ago bad manic anger lasting 3-4 days.  Not sure how her sleep was at the time. Some crying and poor impulse control.   PCP wanted 2nd opinion from neuro on ? PD, Dr. Carles Collet Nov 15. Plan: No med changes pending neurologic appointment  07/20/2022 neurology appointment Dr. Wells Guiles Tat.  Diagnosed TD.  Assessment as follows: 1.  Tardive dyskinesia -The patients symptoms are most consistent with tardive dyskinesia.  She has had exposure to typical and atypical antipsychotic medication.  TD is a heterogeneous syndrome depending on a subtle balance between several neurotransmitters in the brain, including DA receptor blockade and hypersensitivity of DA and GABA receptors. -pt on ingrezza since 02/2022 and both she and notes from Dr. Clovis Pu indicate great benefit.  2.  Tremor             -Largely resolved off of lithium and vraylar (vraylar d/c in 12/2021)             -She has very minor left hand tremor.  Did tell her that Ingrezza can occasionally cause parkinsonism, but I did not see a significant degree of that today.             -I did reassure her today that I saw no evidence of idiopathic Parkinson's disease.  She was reassured.  3.  Bipolar d/o             -difficult to control per records             -sees Dr. Clovis Pu frequently  4.  Discussed with patient that she really does not need neurologic follow-up at this point in time.  She was happy to hear this.  08/08/2022 appointment noted: No med changes. Still having a lot of anxiety and worries too much. Worse than depression.  No mania since here.  No sig avoidance.   Hard time  concentration. Still having irritability and anger. SE drowsy with clozapine in AM and hard to function until about 10 AM.  Takes it about 8 PM and then goes to bed.   No falling but is shuffling more since here.   Sleep 10 hours.    09/07/22 appt noted:   Consistently on clozapine 75 mg HS.  Too drowsy if takes 25 mg in AM. Doing so so .  A lot of anxiety, anger, irritation.   Avg 8-9 hours of sleep and pushes herself to get up .  Hard to function in am until 11 or 12 noon. Ingreza 40 mg BID still some mouth movements and biting tongue.  Is better with Ingrezza but not gone.   SE consitpation and drowsy.   She is aware of the difficulty finding balance between aenough med to manage her sx and not so much to cause intolerable SE.  Disc dosing of her meds. Plan: Augment clozapine with fluvoxamine 25 mg HS  10/11/22 appt noted: : Current psych meds: Clozapine 75 mg nightly, Focalin 15 mg twice daily, fluvoxamine 25 mg nightly, lamotrigine 100 mg twice daily, Ingrezza 40 mg twice daily No noticeable change with fluvoxamine. Drooling worse in am and stupor until about 11 AM.   Mood still not good , angry and irritable a lot.  H acuses he rof going off her meds.  Less mouth movements and biting tonue. Sleep 9-10 hours. Anxiety still through the roof. Tremor better right now unless weak.   H prostate CA with bone mets and on pain meds. Plan: Augment clozapine with fluvoxamine but increase '50mg'$  HS also to help anxiety, irritability  11/09/2022 appointment noted: Added fluvoxamine 50 mg HS.  And is less anxious and more grounded.  Half as irritable as before fluvoxamine.   Down side is shuffling steps seem worse.  Not dizzy usually but balance isn't good.  Gets better after the day progresses.    Overall does feel improved.   Trembling better and mouth movements much better but drooling is bad nothing seems to help that. Drools in public is embarrassing. Tired a lot better as day progresses.     Past Psychiatric Medication Trials: Vraylar 4.5 SE mouth movements reduced to 3 mg 3/20. It was effective at lower doses for depression.  Worse off it.  Vraylar 1.5 mg every third day led to relapse of significant depression. [place]ta SE at 42 mg .  Cost problems Latuda 80, , olanzapine, Seroquel, risperidone, Abilify, loxapine 25 mg BID (max tolerated) NR, Clozapine 50  Ingrezza 40 BID  lithium 150 tremor   Trileptal 450, Depakote, Equetro 300 hx balance issues, CBZ ER falling,   Lamictal 200 twice daily,  Focalin,  Ritalin,  fluoxetine 60,  sertraline 100, Wellbutrin history of facial tics, paroxetine cognitive side effects, Lexapro 10 worse buspirone,  ropinirole, amantadine, Sinemet, Artane, Cogentin,  pramipexole 0.5 mg BID helped tremor but caused anger trazodone hangover, Ambien hangover,  Review of Systems:  Review of Systems  HENT:  Positive for dental problem and tinnitus.        Chirping cricket sounds in hears since January 2023 drooling  Respiratory:  Positive for cough.   Cardiovascular:  Negative for chest pain.  Gastrointestinal:  Negative for abdominal pain and nausea.       GERD awakening her  Musculoskeletal:  Positive for arthralgias and gait problem.  Neurological:  Positive for dizziness and tremors.       Ankle problems and balance problems. No falls lately. Occ stumbles. Tremor is better in hands Mouth movements  Psychiatric/Behavioral:  Positive for depression and dysphoric mood. Negative for agitation, behavioral problems, confusion, decreased concentration, hallucinations, self-injury, sleep disturbance and suicidal ideas. The patient is nervous/anxious. The patient is not hyperactive.   No falls since here. Not currently depressed but unable to remove this from the list.  Medications: I have reviewed the patient's current medications.  Current Outpatient Medications  Medication Sig Dispense Refill   acetaminophen (TYLENOL) 650 MG CR tablet Take  1,300 mg by mouth as needed for pain.     albuterol (VENTOLIN HFA) 108 (90 Base) MCG/ACT inhaler Inhale 2 puffs into the lungs every 4 (four) hours as needed for wheezing or shortness of breath. 18 g 0   atorvastatin (LIPITOR) 20 MG tablet TAKE (1) TABLET BY MOUTH AT BEDTIME. 90 tablet 0   cloZAPine (CLOZARIL) 25 MG tablet TAKE (3) TABLETS BY MOUTH AT BEDTIME. 21 tablet 4   Ferrous Gluconate (IRON 27 PO) Take by mouth.     fluvoxaMINE (LUVOX) 25 MG tablet Take 2 tablets (50 mg total) by mouth at bedtime. 90 tablet 0   FOCALIN 10 MG tablet Take 1.5 tablets (15 mg total) by mouth 2 (two) times daily. 90 tablet 0   guaiFENesin (MUCINEX) 600 MG 12 hr tablet Take 1 tablet (600 mg total) by mouth 2 (two) times daily as needed. 20 tablet 0   INGREZZA 40 MG capsule TAKE 1 CAPSULE BY MOUTH EVERY MORNING & TAKE 1 CAPSULE BY MOUTH AT BEDTIME 60 capsule 2   ketoconazole (NIZORAL) 2 % cream Apply 1 Application topically daily. 60 g 0   lamoTRIgine (LAMICTAL) 200 MG tablet TAKE (1) TABLET BY MOUTH TWICE DAILY. (Patient taking differently: 1/2 BID) 180 tablet 0   Melatonin 10 MG CAPS Take by mouth at bedtime as needed.     pantoprazole (PROTONIX) 40 MG  tablet Take 1 tablet (40 mg total) by mouth 2 (two) times daily. 180 tablet 3   promethazine-dextromethorphan (PROMETHAZINE-DM) 6.25-15 MG/5ML syrup Take 5 mLs by mouth 4 (four) times daily as needed. 100 mL 0   No current facility-administered medications for this visit.    Medication Side Effects: Other: tremor and weight gain.   Dyskinesia appears better  SE bettter than they were.  Balance problems intermittently  Allergies:  Allergies  Allergen Reactions   Azithromycin Anaphylaxis   Penicillins Anaphylaxis    DID THE REACTION INVOLVE: Swelling of the face/tongue/throat, SOB, or low BP? Yes Sudden or severe rash/hives, skin peeling, or the inside of the mouth or nose? Yes Did it require medical treatment? No When did it last happen?       If all  above answers are "NO", may proceed with cephalosporin use.  Patient reacts to Z pack.  HAS Taken amoxicillin fine.   Adhesive [Tape] Other (See Comments)    On bandaids    Past Medical History:  Diagnosis Date   ADD (attention deficit disorder)    Allergy    Seasonal   Anemia    History of GI blood loss   Anxiety    Arthritis    Atrophy of vagina 10/07/2020   Bipolar 1 disorder (HCC)    Cancer ([place])    Colon polyps    Depression    Diabetes mellitus ([place])    pt denies   Edema, lower extremity    Epistaxis    Around 2011 or 2012, required cauterization.    Esophageal stricture    Fracture of superior pubic ramus (HCC) 11/28/2018   GERD (gastroesophageal reflux disease)    Headache(784.0)    Hyperlipidemia    Interstitial cystitis    Joint pain    Lactose intolerance    Lung cancer ([place]) 2002   Neuromuscular disorder ([place])    Obesity    Osteoarthritis    Palpitations    Sleep apnea    Doesn't use a CPAP   Suicidal ideation 01/20/2020   Swallowing difficulty    Tardive dyskinesia     Family History  Problem Relation Age of Onset   Arthritis Mother    Hearing loss Mother    Hyperlipidemia Mother    Hypertension Mother    Depression Mother    Anxiety disorder Mother    Obesity Mother    Sudden death Mother    Hypertension Father    Diabetes Mellitus II Father    Heart disease Father    Arthritis Father    Cancer Father        Brain   COPD Father    Diabetes Father    Hyperlipidemia Father    Sleep apnea Father    Early death Sister        Aneroxia/Bulimic   Depression Brother    Early death Proofreader Accident   Stroke Maternal Grandmother    Hypertension Maternal Grandmother    Arthritis Maternal Grandfather    Heart attack Maternal Grandfather    Hearing loss Maternal Grandfather    Depression Daughter    Drug abuse Daughter    Heart disease Daughter    Hypertension Daughter    Colon cancer Neg Hx    Esophageal cancer Neg Hx     Rectal cancer Neg Hx     Social History   Socioeconomic History   Marital status: Married    Spouse name: Not  on file   Number of children: 1   Years of education: 12   Highest education level: Not on file  Occupational History   Occupation: retired  Tobacco Use   Smoking status: Never   Smokeless tobacco: Never  Vaping Use   Vaping Use: Never used  Substance and Sexual Activity   Alcohol use: Yes    Alcohol/week: 1.0 standard drink of alcohol    Types: 1 Glasses of wine per week    Comment: 1 glass wine q few weeks   Drug use: No   Sexual activity: Yes  Other Topics Concern   Not on file  Social History Narrative   Pt lives in [place] with husband Lanny Hurst.  Followed by Dr. Clovis Pu for psychiatry and Rinaldo Cloud for therapy.   Right handed   Drinks caffeine   One story home   Married lives with husband   retired   Investment banker, operational of Radio broadcast assistant Strain: [place]  (05/11/2022)   Overall Financial Resource Strain (CARDIA)    Difficulty of Paying Living Expenses: Not hard at all  Food Insecurity: No Food Insecurity (05/11/2022)   Hunger Vital Sign    Worried About Running Out of Food in the Last Year: Never true    Ran Out of Food in the Last Year: Never true  Transportation Needs: No Transportation Needs (05/11/2022)   PRAPARE - Hydrologist (Medical): No    Lack of Transportation (Non-Medical): No  Physical Activity: Inactive (05/11/2022)   Exercise Vital Sign    Days of Exercise per Week: 0 days    Minutes of Exercise per Session: 0 min  Stress: No Stress Concern Present (05/09/2021)   [place]    Feeling of Stress : Only a little  Social Connections: Moderately Integrated (05/11/2022)   Social Connection and Isolation Panel [NHANES]    Frequency of Communication with Friends and Family: More than three times a week    Frequency of Social Gatherings with  Friends and Family: More than three times a week    Attends Religious Services: More than 4 times per year    Active Member of [employer] or Organizations: No    Attends Archivist Meetings: Never    Marital Status: Married  Human resources officer Violence: Not At Risk (05/09/2021)   Humiliation, Afraid, Rape, and Kick questionnaire    Fear of Current or Ex-Partner: No    Emotionally Abused: No    Physically Abused: No    Sexually Abused: No    Past Medical History, Surgical history, Social history, and Family history were reviewed and updated as appropriate.   Please see review of systems for further details on the patient's review from today.   Objective:   Physical Exam:  There were no vitals taken for this visit.  Physical Exam Constitutional:      General: She is not in acute distress. Musculoskeletal:        General: No deformity.  Neurological:     Mental Status: She is alert and oriented to person, place, and time.     Cranial Nerves: No dysarthria.     Motor: Tremor present.     Coordination: Coordination normal.     Comments: Mild resting R hand rotational tremor better but not gone. Fidgety mildy feet better Some buccolingual tremor is better but residual  Psychiatric:        Attention and Perception:  Attention and perception normal. She does not perceive auditory hallucinations.        Mood and Affect: Mood is anxious and depressed. Affect is not labile, blunt or angry.        Speech: Speech normal. Speech is not rapid and pressured.        Behavior: Behavior normal. Behavior is not agitated or slowed. Behavior is cooperative.        Thought Content: Thought content normal. Thought content is not paranoid or delusional. Thought content does not include homicidal or suicidal ideation. Thought content does not include suicidal plan.        Cognition and Memory: Cognition and memory normal.        Judgment: Judgment normal.     Comments: Insight intact more mood  cycling and still anxious. Without much change since last visit. Much less anxious with fluvoxamine Overall mood needs more improvement but depression better with clozapine but not resolved     Lab Review:     Component Value Date/Time   NA 141 09/27/2022 0950   K 4.0 09/27/2022 0950   CL 103 09/27/2022 0950   CO2 22 09/27/2022 0950   GLUCOSE 111 (H) 09/27/2022 0950   GLUCOSE 112 (H) 05/25/2022 1319   BUN 18 09/27/2022 0950   CREATININE 0.96 09/27/2022 0950   CALCIUM 9.6 09/27/2022 0950   PROT 7.0 09/27/2022 0950   ALBUMIN 4.5 09/27/2022 0950   AST 25 09/27/2022 0950   ALT 15 09/27/2022 0950   ALKPHOS 72 09/27/2022 0950   BILITOT 0.4 09/27/2022 0950   GFRNONAA >60 05/25/2022 1319   GFRAA 96 03/02/2020 1433       Component Value Date/Time   WBC 7.1 10/26/2022 1051   WBC 7.4 01/19/2020 1158   RBC 4.81 10/26/2022 1051   RBC 4.19 01/19/2020 1158   HGB 13.7 10/26/2022 1051   HCT 43.0 10/26/2022 1051   PLT 195 10/26/2022 1051   MCV 89 10/26/2022 1051   MCH 28.5 10/26/2022 1051   MCH 30.3 01/19/2020 1158   MCHC 31.9 10/26/2022 1051   MCHC 32.1 01/19/2020 1158   RDW 13.1 10/26/2022 1051   LYMPHSABS 2.2 10/26/2022 1051   MONOABS 0.7 04/04/2019 1004   EOSABS 0.2 10/26/2022 1051   BASOSABS 0.0 10/26/2022 1051  Vitamin D level 33 on 10K units daily on 12/4/`9 Increased to prescription vitamin d 50K units Monday, Wed, Friday.  Rx sent in.   Lithium Lvl  Date Value Ref Range Status  10/21/2018 0.18 (L) 0.60 - 1.20 mmol/L Final    Comment:    Performed at [facility], [address], [place], [place] [address]     No results found for: "PHENYTOIN", "PHENOBARB", "VALPROATE", "CBMZ"   .res Assessment: Plan:    Bipolar disorder with moderate depression ([place])  Generalized anxiety disorder  Tardive dyskinesia  Attention deficit hyperactivity disorder (ADHD), predominantly inattentive type  Insomnia due to mental condition  Mild cognitive impairment  Low vitamin D  level  Long term current use of clozapine  Tremor of both hands  Greater than 50% of 30 min face to face time with patient was spent on counseling and coordination of care. We discussed multiple dxes and concerns.   Anahit has chronic rapid cycling bipolar disorder which is chronically unstable and has been difficult to control.  Failed 14 different mood stabilizers.  The rapid cycling is making it difficult to control frequency of depressive episodes and the anxiety as well.  We have typically had  to make frequent med changes.   Disc gradual increase bc med sensitivity.  Med sensitivity to SE seems to be the biggest problem preventing better mood control  (Started clozapine on 12/07/21).  Continue clozapine 75 mg PM bc can't tolerate more. Having SE drooling and mild sedation.  Eventually sedation likely to get better. Check CBC with diff every other week until 12/08/22 then monthly  Augment clozapine with fluvoxamine but REDUCE TO 37.5 mg mg HS bc more shuffling of feet since the increase to 50 mg daily.  But did help anxiety, irritability  Ingrezza for TD helped stop tongue chewing but still some mouth movements at 80 mg, which was started 05/23/22.  Shuffling a little more and can't reduce Ingrezza.  Split ingrezza 40 BID  Disc ECT. Only FDA approved option left. However she decided to pursue clozapine and is up to 25 mg for 1 night. Extensive discussion of clozapine dosing recommendations but we will increase more slowly bc she is so med sensitive.Disc risk low WBC, cardiomyopathy, etc, sedation Disc change to  biweekly labs etc and REMS program.    Prone to UTI and may be causing confusion discussed.  Had confirmed UTI recently.  She has a high residual anxiety.  It has been impossible to control all of her symptoms simultaneously without causing side effects. Failed various meds.  Discussed side effects of each medicine. Continue   Focalin IR 15 mg BID DT Cost and off label for depression   Try to spread this out if possible for mood.  She feels this is necessary  Lamotrigine reduced to  100 mg twice daily to try to reduce polypharmacy and so improve tolerability of clozapine.  consider reduction if clozapine helps.  Failed all reasonable alternatives for anxiety.  Option viibryd  Discussed potential metabolic side effects associated with atypical antipsychotics, as well as potential risk for movement side effects. Advised pt to contact office if movement side effects occur.    Checked B12 folate bc memory complaints.  Normal B12 & folate on 05/25/22  Disc SE meds and this is heightened by the complication of necessary polypharmacy.  Supportive therapy in terms of dealing with husband's addiction and now new dx metastatic prostate CA.Marland Kitchen Rec exercise as it would help.    Option mouth wash for drooling.  She wants to pursue this.  If compounded then to [place] frequent FU DT chronic instability.  Wants to schedule monthly.  FU 4 weeks.   Lynder Parents MD, DFAPA  Please see After Visit Summary for patient specific instructions.    Future Appointments  Date Time Provider [place]  11/09/2022 11:30 AM Cottle, Billey Co., MD CP-CP None  11/28/2022 10:00 AM Shanon Ace, LCSW CP-[facility] None  12/12/2022 10:00 AM Cottle, Billey Co., MD CP-CP None  12/19/2022 10:00 AM Shanon Ace, LCSW CP-CP None  01/09/2023 10:30 AM Cottle, Billey Co., MD CP-CP None  01/10/2023 10:00 AM Shanon Ace, LCSW CP-CP None  01/31/2023 10:00 AM Shanon Ace, LCSW CP-CP None  02/21/2023 10:00 AM Shanon Ace, LCSW CP-CP None  03/27/2023  1:00 PM Lindell Spar, MD R[facility] RPC    No orders of the defined types were placed in this encounter.      -------------------------------

## 2022-11-10 DIAGNOSIS — Z79899 Other long term (current) drug therapy: Secondary | ICD-10-CM | POA: Diagnosis not present

## 2022-11-10 DIAGNOSIS — F319 Bipolar disorder, unspecified: Secondary | ICD-10-CM | POA: Diagnosis not present

## 2022-11-11 ENCOUNTER — Encounter: Payer: Self-pay | Admitting: Psychiatry

## 2022-11-16 ENCOUNTER — Telehealth: Payer: Self-pay

## 2022-11-17 ENCOUNTER — Other Ambulatory Visit: Payer: Self-pay | Admitting: Psychiatry

## 2022-11-17 DIAGNOSIS — F319 Bipolar disorder, unspecified: Secondary | ICD-10-CM

## 2022-11-20 NOTE — Telephone Encounter (Signed)
Prior Approval received effective through 11/18/2023 for Ingrezza 40 mg with [employer].   Pt was notified.

## 2022-11-20 NOTE — Telephone Encounter (Signed)
Prior Authorization renewal for Ingrezza 40 mg caps #60/30 day submitted with [employer] Part D, pending response

## 2022-11-22 ENCOUNTER — Telehealth: Payer: Self-pay

## 2022-11-22 NOTE — Telephone Encounter (Signed)
It is likely the clozapine being affected by the fluvoxamine.  Have her reduce clozapine to 2 of the 25 mg tablets nightly.

## 2022-11-22 NOTE — Telephone Encounter (Signed)
Pt notified to reduce her Clozapine. Advised to contact office if symptoms worsen or further questions.

## 2022-11-22 NOTE — Telephone Encounter (Signed)
Pt contacted me to report she has been experiencing loss of balance when she gets up during the night and in the morning when she wakes up. Its all she can do is to take baby steps or shuffle. She reports it gradually gets better later in the day. She also reports she feels like she is in a stupor when she gets up and its hard to speak and understand what may be going on. She is fearful of falling. Reports it has been going on for several weeks, next apt is 12/12/22.  Informed her I would inform Dr. Clovis Pu and follow up with recommendation.

## 2022-11-23 DIAGNOSIS — Z79899 Other long term (current) drug therapy: Secondary | ICD-10-CM | POA: Diagnosis not present

## 2022-11-23 DIAGNOSIS — F319 Bipolar disorder, unspecified: Secondary | ICD-10-CM | POA: Diagnosis not present

## 2022-11-24 ENCOUNTER — Encounter: Payer: Self-pay | Admitting: Internal Medicine

## 2022-11-24 ENCOUNTER — Encounter: Payer: Self-pay | Admitting: Psychiatry

## 2022-11-28 ENCOUNTER — Encounter: Payer: Self-pay | Admitting: Internal Medicine

## 2022-11-28 ENCOUNTER — Ambulatory Visit (INDEPENDENT_AMBULATORY_CARE_PROVIDER_SITE_OTHER): Payer: No Typology Code available for payment source | Admitting: Internal Medicine

## 2022-11-28 ENCOUNTER — Ambulatory Visit (INDEPENDENT_AMBULATORY_CARE_PROVIDER_SITE_OTHER): Payer: No Typology Code available for payment source | Admitting: Psychiatry

## 2022-11-28 VITALS — BP 121/83 | HR 87 | Ht 60.0 in | Wt 173.0 lb

## 2022-11-28 DIAGNOSIS — E6609 Other obesity due to excess calories: Secondary | ICD-10-CM | POA: Diagnosis not present

## 2022-11-28 DIAGNOSIS — F319 Bipolar disorder, unspecified: Secondary | ICD-10-CM

## 2022-11-28 DIAGNOSIS — E782 Mixed hyperlipidemia: Secondary | ICD-10-CM

## 2022-11-28 DIAGNOSIS — F3132 Bipolar disorder, current episode depressed, moderate: Secondary | ICD-10-CM | POA: Diagnosis not present

## 2022-11-28 NOTE — Patient Instructions (Signed)
Please continue to follow low carb diet. Please try to cut down soft drinks.  Please continue to perform moderate exercise/walking at least 150 mins/week.

## 2022-11-28 NOTE — Progress Notes (Signed)
[facility] Counselor/Therapist Progress Note  Patient ID: Megan Whitney, MRN: [id number],    Date: 11/28/2022  Time Spent: 55 minutes  Treatment Type: Individual Therapy  Reported Symptoms: anxiety, depression  Mental Status Exam:  Appearance:   Casual     Behavior:  Appropriate, Sharing, and Motivated  Motor:  Normal  Speech/Language:   Clear and Coherent  Affect:  Depressed and anxious  Mood:  anxious and depressed  Thought process:  goal directed  Thought content:    Obsessive thoughts  Sensory/Perceptual disturbances:    WNL  Orientation:  oriented to person, place, time/date, situation, day of week, month of year, year, and stated date of November 28, 2022  Attention:  Good  Concentration:  Good and Fair  Memory:  Some occasional short term memory issues and PCP is aware  Fund of knowledge:   Good  Insight:    Good and Fair  Judgment:   Good and Fair  Impulse Control:  Good and Fair   Risk Assessment: Danger to Self:  No Self-injurious Behavior: No Danger to Others: No Duty to Warn:no Physical Aggression / Violence:No  Access to Firearms a concern: No  Gang Involvement:No   Subjective:   Patient in session today and reporting anxiety and depression with anxiety currently being her stronger symptom. Also reports feeling overwhelmed, anger, obsessive thoughts, and irritability, and impatient. Notes that fears about husband's illness, and her taking over their finances to manage, and husband's overspending are all stressors for patient. Processed her fears openly about husband's continued drinking, and acknowledges she cannot control him. Discussed her fears about husband's illness. Looking more at what I can and cannot change or control. Saw quotes in office here that she strongly related to in reference to "courage in difficult circumstances" and was able to see  some of the growth she is experiencing even in difficult times.Due to heightened anxiety, did work today  on some anxiety-reduction strategies to which she responded well and is seeing some of her on progress.  Continues to use her rolling, devotions, contacts with other friends, and prior that helps her as she is trying to feel more grounded.  Interventions: Cognitive Behavioral Therapy and Ego-Supportive  Long Term Goal: Reduce overall level, frequency, and intensity of the anxiety so that daily functioning is not impaired. Short Term Goal: 1.Increase understanding of the beliefs and messages that produce the worry and anxiety. Strategies: 1.Help client develop reality-based positive cognitive messages/self-talk. 2. Develop a "coping card" or other reminder which coping strategies are recorded for patient's later use  Diagnosis:   ICD-10-CM   1. Bipolar disorder with moderate depression ([place])  F31.32      Plan:  Patient actively participating in session today as she worked on her anxiety and depression related to personal and issues in living with her alcoholic husband who also has cancer.  Doing very well, as noted above and working on her goals and trying to better manage stressors rather than letting the stressors manage her.  Is having some success with this and is motivated to continue.  Does need to continue working with goal-directed behaviors in order to keep moving in a forward direction. Encouraged patient in her practice of more positive and self affirming behaviors as noted in session including: Focus on making good decisions versus impulsive decisions, remain in contact with supportive friends and family, believing herself and her abilities to manage challenging circumstances, use of journaling between sessions, taking breaks occasionally from  her electronics, setting limits with others who are not supportive, finding the strengths and positives within herself, refrain from assuming worst-case scenarios, getting outside some each day, spending time with her 2 dogs which is very  therapeutic for her, positive self talk, and realize the strength she shows when working with goal-directed behaviors to move in a direction that supports her improved emotional health and outlook.  Self-Rating Scale: 1-10 Depression Scale- 5 (remains the same) 1-10 Anxiety Scale- 8 (increased from a 5)   Goal review and progress/challenges noted with patient.  Next appointment within 2 to 3 weeks.  This record has been created using [employer].  Chart creation errors have been sought, but may not always have been located and corrected.  Such creation errors do not reflect on the standard of medical care provided.   Shanon Ace, LCSW

## 2022-11-29 DIAGNOSIS — E6609 Other obesity due to excess calories: Secondary | ICD-10-CM | POA: Insufficient documentation

## 2022-11-29 NOTE — Progress Notes (Signed)
Acute Office Visit  Subjective:    Patient ID: Megan Whitney, female    DOB: [birth year], 67 y.o.   MRN: [id number]  Chief Complaint  Patient presents with   Weight Management Screening    Patient would like to start a weight loss medication    HPI Patient is in today for discussion of weight management.  She has been trying to follow LDL diet and has started weight watchers since 11/23.  She has had difficulty losing weight lately.  She used to weigh around 225 lbs and was able to lose up to 100 lbs from there with diet modification.  She gained her weight up to 173 lbs now after stopping the diet program, which she has restarted now.  She wanted to discuss medical weight loss options.  I had lengthy discussion about oral options and GLP-1 agonist.  She agrees to follow low-carb diet and continue exercise as tolerated for now.  She agrees to cut down soft drink intake.  Past Medical History:  Diagnosis Date   ADD (attention deficit disorder)    Allergy    Seasonal   Anemia    History of GI blood loss   Anxiety    Arthritis    Atrophy of vagina 10/07/2020   Bipolar 1 disorder (HCC)    Cancer ([place])    Colon polyps    Depression    Diabetes mellitus ([place])    pt denies   Edema, lower extremity    Epistaxis    Around 2011 or 2012, required cauterization.    Esophageal stricture    Fracture of superior pubic ramus (HCC) 11/28/2018   GERD (gastroesophageal reflux disease)    Headache(784.0)    Hyperlipidemia    Interstitial cystitis    Joint pain    Lactose intolerance    Lung cancer ([place]) 2002   Neuromuscular disorder ([place])    Obesity    Osteoarthritis    Palpitations    Sleep apnea    Doesn't use a CPAP   Suicidal ideation 01/20/2020   Swallowing difficulty    Tardive dyskinesia     Past Surgical History:  Procedure Laterality Date   BALLOON DILATION  05/16/2012   Procedure: BALLOON DILATION;  Surgeon: Inda Castle, MD;  Location: [facility];  Service:  Endoscopy;  Laterality: N/A;   BUNIONECTOMY  2011   COLONOSCOPY     ENTEROSCOPY  05/16/2012   Procedure: ENTEROSCOPY;  Surgeon: Inda Castle, MD;  Location: [facility];  Service: Endoscopy;  Laterality: N/A;   JOINT REPLACEMENT     right shoulder durgery 25 yrs ago  [place] Bilateral 2006, 2008   bilateral   TUBAL LIGATION  1990   WEDGE RESECTION  2002   lung cancer    Family History  Problem Relation Age of Onset   Arthritis Mother    Hearing loss Mother    Hyperlipidemia Mother    Hypertension Mother    Depression Mother    Anxiety disorder Mother    Obesity Mother    Sudden death Mother    Hypertension Father    Diabetes Mellitus II Father    Heart disease Father    Arthritis Father    Cancer Father        Brain   COPD Father    Diabetes Father    Hyperlipidemia Father    Sleep apnea Father    Early death Sister  Aneroxia/Bulimic   Depression Brother    Early death Proofreader Accident   Stroke Maternal Grandmother    Hypertension Maternal Grandmother    Arthritis Maternal Grandfather    Heart attack Maternal Grandfather    Hearing loss Maternal Grandfather    Depression Daughter    Drug abuse Daughter    Heart disease Daughter    Hypertension Daughter    Colon cancer Neg Hx    Esophageal cancer Neg Hx    Rectal cancer Neg Hx     Social History   Socioeconomic History   Marital status: Married    Spouse name: Not on file   Number of children: 1   Years of education: 12   Highest education level: Not on file  Occupational History   Occupation: retired  Tobacco Use   Smoking status: Never   Smokeless tobacco: Never  Vaping Use   Vaping Use: Never used  Substance and Sexual Activity   Alcohol use: Yes    Alcohol/week: 1.0 standard drink of alcohol    Types: 1 Glasses of wine per week    Comment: 1 glass wine q few weeks   Drug use: No   Sexual activity: Yes  Other Topics Concern   Not on file  Social  History Narrative   Pt lives in [place] with husband Lanny Hurst.  Followed by Dr. Clovis Pu for psychiatry and Rinaldo Cloud for therapy.   Right handed   Drinks caffeine   One story home   Married lives with husband   retired   Investment banker, operational of Radio broadcast assistant Strain: [place]  (05/11/2022)   Overall Financial Resource Strain (CARDIA)    Difficulty of Paying Living Expenses: Not hard at all  Food Insecurity: No Food Insecurity (05/11/2022)   Hunger Vital Sign    Worried About Running Out of Food in the Last Year: Never true    Ran Out of Food in the Last Year: Never true  Transportation Needs: No Transportation Needs (05/11/2022)   PRAPARE - Hydrologist (Medical): No    Lack of Transportation (Non-Medical): No  Physical Activity: Inactive (05/11/2022)   Exercise Vital Sign    Days of Exercise per Week: 0 days    Minutes of Exercise per Session: 0 min  Stress: No Stress Concern Present (05/09/2021)   [place]    Feeling of Stress : Only a little  Social Connections: Moderately Integrated (05/11/2022)   Social Connection and Isolation Panel [NHANES]    Frequency of Communication with Friends and Family: More than three times a week    Frequency of Social Gatherings with Friends and Family: More than three times a week    Attends Religious Services: More than 4 times per year    Active Member of [employer] or Organizations: No    Attends Archivist Meetings: Never    Marital Status: Married  Human resources officer Violence: Not At Risk (05/09/2021)   Humiliation, Afraid, Rape, and Kick questionnaire    Fear of Current or Ex-Partner: No    Emotionally Abused: No    Physically Abused: No    Sexually Abused: No    Outpatient Medications Prior to Visit  Medication Sig Dispense Refill   acetaminophen (TYLENOL) 650 MG CR tablet Take 1,300 mg by mouth as needed for pain.     albuterol  (VENTOLIN HFA) 108 (90 Base)  MCG/ACT inhaler Inhale 2 puffs into the lungs every 4 (four) hours as needed for wheezing or shortness of breath. 18 g 0   atorvastatin (LIPITOR) 20 MG tablet TAKE (1) TABLET BY MOUTH AT BEDTIME. 90 tablet 0   cloZAPine (CLOZARIL) 25 MG tablet TAKE (3) TABLETS BY MOUTH AT BEDTIME. 21 tablet 2   Ferrous Gluconate (IRON 27 PO) Take by mouth.     fluvoxaMINE (LUVOX) 25 MG tablet Take 2 tablets (50 mg total) by mouth at bedtime. 180 tablet 0   FOCALIN 10 MG tablet Take 1.5 tablets (15 mg total) by mouth 2 (two) times daily. 90 tablet 0   guaiFENesin (MUCINEX) 600 MG 12 hr tablet Take 1 tablet (600 mg total) by mouth 2 (two) times daily as needed. 20 tablet 0   INGREZZA 40 MG capsule TAKE 1 CAPSULE BY MOUTH EVERY MORNING & TAKE 1 CAPSULE BY MOUTH AT BEDTIME 60 capsule 2   ketoconazole (NIZORAL) 2 % cream Apply 1 Application topically daily. 60 g 0   lamoTRIgine (LAMICTAL) 100 MG tablet Take 1 tablet (100 mg total) by mouth 2 (two) times daily. 180 tablet 0   Melatonin 10 MG CAPS Take by mouth at bedtime as needed.     pantoprazole (PROTONIX) 40 MG tablet Take 1 tablet (40 mg total) by mouth 2 (two) times daily. 180 tablet 3   promethazine-dextromethorphan (PROMETHAZINE-DM) 6.25-15 MG/5ML syrup Take 5 mLs by mouth 4 (four) times daily as needed. 100 mL 0   No facility-administered medications prior to visit.    Allergies  Allergen Reactions   Azithromycin Anaphylaxis   Penicillins Anaphylaxis    DID THE REACTION INVOLVE: Swelling of the face/tongue/throat, SOB, or low BP? Yes Sudden or severe rash/hives, skin peeling, or the inside of the mouth or nose? Yes Did it require medical treatment? No When did it last happen?       If all above answers are "NO", may proceed with cephalosporin use.  Patient reacts to Z pack.  HAS Taken amoxicillin fine.   Adhesive [Tape] Other (See Comments)    On bandaids    Review of Systems  Constitutional:  Negative for chills and  fever.  HENT:  Negative for congestion, sinus pressure, sinus pain and sore throat.   Eyes:  Negative for pain and discharge.  Respiratory:  Negative for cough and shortness of breath.   Cardiovascular:  Negative for palpitations.  Gastrointestinal:  Negative for diarrhea, nausea and vomiting.  Endocrine: Negative for polydipsia and polyuria.  Genitourinary:  Negative for dysuria and hematuria.  Musculoskeletal:  Negative for neck pain and neck stiffness.  Skin:  Negative for rash.  Neurological:  Negative for dizziness and weakness.  Psychiatric/Behavioral:  Positive for sleep disturbance. Negative for agitation and behavioral problems. The patient is nervous/anxious.        Objective:    Physical Exam Vitals reviewed.  Constitutional:      General: She is not in acute distress.    Appearance: She is obese. She is not diaphoretic.  HENT:     Head: Normocephalic and atraumatic.     Nose: Nose normal. No nasal tenderness.     Right Sinus: No maxillary sinus tenderness.     Left Sinus: No maxillary sinus tenderness.     Mouth/Throat:     Mouth: Mucous membranes are moist.  Eyes:     General: No scleral icterus.    Extraocular Movements: Extraocular movements intact.  Cardiovascular:     Rate and Rhythm:  Normal rate and regular rhythm.     Pulses: Normal pulses.     Heart sounds: Normal heart sounds. No murmur heard. Pulmonary:     Breath sounds: Normal breath sounds. No wheezing or rales.  Musculoskeletal:     Cervical back: Neck supple. No tenderness.     Right lower leg: No edema.     Left lower leg: No edema.  Skin:    General: Skin is warm.     Findings: No rash.  Neurological:     General: No focal deficit present.     Mental Status: She is alert and oriented to person, place, and time.     Sensory: No sensory deficit.     Motor: No weakness.     Gait: Gait abnormal.     Comments: Resting tremor of right hand  Psychiatric:        Behavior: Behavior normal.         Thought Content: Thought content normal.     BP 121/83 (BP Location: Right Arm, Patient Position: Sitting, Cuff Size: Normal)   Pulse 87   Ht 5' (1.524 m)   Wt 173 lb (78.5 kg)   SpO2 93%   BMI 33.79 kg/m  Wt Readings from Last 3 Encounters:  11/28/22 173 lb (78.5 kg)  09/22/22 179 lb 9.6 oz (81.5 kg)  07/20/22 175 lb (79.4 kg)        Assessment & Plan:   Problem List Items Addressed This Visit       Other   Bipolar disorder ([place])    On Luvox, Lamictal, Ingrezza, clozapine and Focalin Follows up with Psychiatry Recent folic acid and [id number] levels WNL MMSE - 30/30      Hyperlipidemia    On Atorvastatin Lipid profile reviewed      Class 1 obesity due to excess calories without serious comorbidity in adult - Primary    Advised to follow low-carb diet and perform moderate exercise/walking as tolerated Antidepressants can also contribute to weight gain Had discussion about medical weight loss options - she is on Focalin for ADD, not candidate for stimulant medicine as appetite suppressant She would benefit from GLP-1 agonist, but cost is a concern Needs to cut down soft drink intake and focus on low-carb diet        No orders of the defined types were placed in this encounter.    Lindell Spar, MD

## 2022-11-29 NOTE — Assessment & Plan Note (Signed)
On Atorvastatin Lipid profile reviewed 

## 2022-11-29 NOTE — Assessment & Plan Note (Addendum)
Advised to follow low-carb diet and perform moderate exercise/walking as tolerated Antidepressants can also contribute to weight gain Had discussion about medical weight loss options - she is on Focalin for ADD, not candidate for stimulant medicine as appetite suppressant She would benefit from GLP-1 agonist, but cost is a concern Needs to cut down soft drink intake and focus on low-carb diet

## 2022-11-29 NOTE — Assessment & Plan Note (Signed)
On Luvox, Lamictal, Ingrezza, clozapine and Focalin Follows up with Psychiatry Recent folic acid and B12 levels WNL MMSE - 30/30 

## 2022-12-05 ENCOUNTER — Other Ambulatory Visit: Payer: Self-pay | Admitting: Psychiatry

## 2022-12-05 ENCOUNTER — Telehealth: Payer: Self-pay | Admitting: Psychiatry

## 2022-12-05 DIAGNOSIS — F319 Bipolar disorder, unspecified: Secondary | ICD-10-CM | POA: Diagnosis not present

## 2022-12-05 DIAGNOSIS — Z79899 Other long term (current) drug therapy: Secondary | ICD-10-CM | POA: Diagnosis not present

## 2022-12-05 DIAGNOSIS — F314 Bipolar disorder, current episode depressed, severe, without psychotic features: Secondary | ICD-10-CM

## 2022-12-05 NOTE — Telephone Encounter (Signed)
Jaron just called and wants to know if you have a status of her Focalin that requires authorization. She wants to speak to you at [phone] just to get an update whenever you've got a chance Traci.

## 2022-12-05 NOTE — Telephone Encounter (Signed)
[place] just sent a faxed PA request for Focalin, just sent in 12/05/22.

## 2022-12-05 NOTE — Telephone Encounter (Signed)
If the Rx was just sent today I have not sent PA but will as soon as I can.

## 2022-12-06 ENCOUNTER — Encounter: Payer: Self-pay | Admitting: Psychiatry

## 2022-12-06 ENCOUNTER — Telehealth: Payer: Self-pay | Admitting: Psychiatry

## 2022-12-06 NOTE — Telephone Encounter (Signed)
[place] approved FOCALIN tablet for pt (09/06/2022-12/05/2023)  ID # [id number]

## 2022-12-06 NOTE — Telephone Encounter (Signed)
Prior authorization approved for Focalin 10 mg tablets with [employer], effective through 12/05/2023. Pt is aware.

## 2022-12-12 ENCOUNTER — Ambulatory Visit (INDEPENDENT_AMBULATORY_CARE_PROVIDER_SITE_OTHER): Payer: No Typology Code available for payment source | Admitting: Psychiatry

## 2022-12-12 ENCOUNTER — Encounter: Payer: Self-pay | Admitting: Psychiatry

## 2022-12-12 DIAGNOSIS — F411 Generalized anxiety disorder: Secondary | ICD-10-CM

## 2022-12-12 DIAGNOSIS — Z79899 Other long term (current) drug therapy: Secondary | ICD-10-CM

## 2022-12-12 DIAGNOSIS — G2401 Drug induced subacute dyskinesia: Secondary | ICD-10-CM | POA: Diagnosis not present

## 2022-12-12 DIAGNOSIS — G3184 Mild cognitive impairment, so stated: Secondary | ICD-10-CM

## 2022-12-12 DIAGNOSIS — R7989 Other specified abnormal findings of blood chemistry: Secondary | ICD-10-CM

## 2022-12-12 DIAGNOSIS — F3132 Bipolar disorder, current episode depressed, moderate: Secondary | ICD-10-CM | POA: Diagnosis not present

## 2022-12-12 DIAGNOSIS — F5105 Insomnia due to other mental disorder: Secondary | ICD-10-CM | POA: Diagnosis not present

## 2022-12-12 DIAGNOSIS — F900 Attention-deficit hyperactivity disorder, predominantly inattentive type: Secondary | ICD-10-CM | POA: Diagnosis not present

## 2022-12-12 DIAGNOSIS — R251 Tremor, unspecified: Secondary | ICD-10-CM

## 2022-12-12 NOTE — Patient Instructions (Addendum)
First 2 weeks reduce Ingrezza to 60 mg at night to see if shuffling is better.  If shuffling is better call office for change in RX If so then increase clozapine back to 3 of the 25 mg capsules and fluvoxamine back to 50 mg .

## 2022-12-12 NOTE — Progress Notes (Signed)
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 movements and biting tongue.  Sometimes hard to talk.  Drooling.  When tries to increase clozapine slurred speech and severe dizziness.   Mood is a little better.   Sleeping 8-9 hours.  So much better sleep with clozapine.   Still has anxiety, generalized. Plan: To minimize polypharmacy and improve tolerabilty:  Reduce Equetro to 1 of the 100 mg capsule at night for 1 week, then stop it. Wait 1 week then stop the carbamazepine chewable. Wait 1 more week then reduce clonidine to 1/2 at night for 1 week then stop it. Plan: Started clozapine  and continue 25 mg for now bc hasn't tolerated more so far.  02/08/22 appt noted: Mouth movements and biting tongue.  Sores on cheek with constant chewing movements. Sometimes hard to talk. Hypersalivation gets worse as day progresses. Sometimes balance problems.  Constipation managed.   Sleep very well.  8-9 hours. Off Equetro and on clozapine. Still has depression and anxiety without much change Plan: Started clozapine  and continue 25 mg for now bc hasn't tolerated more and need to start Ingrezza 40 mg for TD.  03/23/2022  appointment with the following noted: Several phone calls since here.  Has gotten up to clozapine 37.5 mg nightly. Continues Focalin 15 mg twice daily, lamotrigine 200 mg twice daily, lithium 150 nightly. Started Ingrezza 40 mg daily. Ingrezza amazing difference but even with grant of $10000 can't afford it.  Not biting mouth and mouth less sore.  Less mouth movements but not gone Emotionally not real well with anxiety and depression and crying spells.  Also some irritability and anger.  Easily triggered anger. Sleep more broken with Ingrezza HS but 8-9 hours. Can be sedated if gets up early with slurred speech but not if full night sleep. Balance better off Equetro. Plan: Started clozapine but needs to increase bc minimal effect and better tolerance, so increase to 50 mg HS  04/05/22 appt noted: Increased clozapine to 50 mg HS.  Some groggy in the AM.  One day was dizzy.  Otherwise on occasion.  Takes it right before bed.   Still depression, hopeless, irritability and anger.  Some periods of racing thoughts.  Sometimes recognizes hypomanic episodes and somethimes doesn't recognize. Dog is  very sick and H with bone CA.  Not crying on clozapine as much. GERD and needs surgery for hiatal hernia. Signed up for water aerobics. Plan: Started clozapine but needs to increase bc minimal effect and better tolerance, so increase to 75 mg HS (Started clozapine on 12/07/21) Reduce librium 5 mg HS and plan to stop  05/10/22 appt med: TD partially better with Ingrezza 40.  Has  a grant.   Increased clozapine 75 mg HS, reduced Librium to 5 mg HS. Tolerated OK. Depression a little better.  Irritability still high.  Poor memory and easily confused. Sleep is pretty good and is better and needs to sleep longer.   Plan: DC librium Worsening TD partial response on 40 mg Ingrezza, increase to 60 mg daily   Continue clozapine 100 mg HS  05/17/2022 phone call complaining of sleeping more and feeling sleepy and foggy  thinking also dropping some things and drooling.  She was instructed to reduce the clozapine to 75 mg nightly to see if that was the problem. 05/23/2022 phone call asking to increase Ingrezza from 60 to 80 mg daily.  It was agreed. 06/16/2022 phone call stating she had a bad manic episode the week prior and is still feeling excessively sedated.  Also having hand tremors. Instructed to stop lithium and continue clozapine 75 mg nightly.  She is very med sensitive but we have few options left to treat her unstable bipolar disorder. 06/21/2022 phone call: After complaining of excessive sleepiness and excessive sleeping she is now complaining of insomnia.  07/07/22 appt noted: Current psychiatric medications include clozapine 75 mg nightly, Focalin 15 mg twice daily, lamotrigine 100 mg twice daily Ingrezza 80 mg daily.  She stopped lithium Has a list of concerns: SE drooling bad.   Still have mouth movements with the increase Ingrezza 80 mg daily but has stopped the tongue chewing. Goes to bed 9 PM and to sleep in 30 mins and awaken in the AM about 830 and hard to wake up.  Not napping in the day.  Getting enough sleep.   Notices Focalin kicking in when takes it. Mood depressed but not as bad.  Still some irritability, anger.  3 week ago bad manic anger lasting 3-4 days.  Not sure how her sleep was at the time. Some crying and poor impulse control.   PCP wanted 2nd opinion from neuro on ? PD, Dr. Arbutus Leas Nov 15. Plan: No med changes pending neurologic appointment  07/20/2022 neurology appointment Dr. Lurena Joiner Tat.  Diagnosed TD.  Assessment as follows: 1.  Tardive dyskinesia -The patients symptoms are most consistent with tardive dyskinesia.  She has had exposure to typical and atypical antipsychotic medication.  TD is a heterogeneous syndrome depending on a subtle balance between several neurotransmitters in the brain, including DA receptor blockade and hypersensitivity of DA and GABA receptors. -pt on  ingrezza since 02/2022 and both she and notes from Dr. Jennelle Human indicate great benefit.  2.  Tremor             -Largely resolved off of lithium and vraylar (vraylar d/c in 12/2021)             -She has very minor left hand tremor.  Did tell her that Ingrezza can occasionally cause parkinsonism, but I did not see a significant degree of that today.             -I did reassure her today that I saw no evidence of idiopathic Parkinson's disease.  She was reassured.  3.  Bipolar d/o             -difficult to control per records             -sees Dr. Jennelle Human frequently  4.  Discussed with patient that she really does not need neurologic follow-up at this point in time.  She was happy to hear this.  08/08/2022 appointment noted: No med changes. Still having a lot of anxiety and worries too much. Worse than depression.  No mania since here.  No sig avoidance.   Hard time concentration. Still having irritability and anger. SE drowsy with clozapine in AM and hard to function until about 10 AM.  Takes it about 8 PM and then goes to bed.   No falling but is shuffling more since here.   Sleep 10 hours.    09/07/22 appt noted:   Consistently on clozapine 75 mg HS.  Too drowsy if takes 25 mg in AM. Doing so so .  A lot of anxiety, anger, irritation.   Avg 8-9 hours of sleep and pushes herself to get up .  Hard to function in am until 11 or 12 noon. Ingreza 40 mg BID still some mouth movements and biting tongue.  Is better with Ingrezza but not gone.   SE consitpation and drowsy.   She is aware of the difficulty finding balance between aenough med to manage her sx and not so much to cause intolerable SE.  Disc dosing of her meds. Plan: Augment clozapine with fluvoxamine 25 mg HS  10/11/22 appt noted: : Current psych meds: Clozapine 75 mg nightly, Focalin 15 mg twice daily, fluvoxamine 25 mg nightly, lamotrigine 100 mg twice daily, Ingrezza 40 mg twice daily No noticeable change with fluvoxamine. Drooling worse  in am and stupor until about 11 AM.   Mood still not good , angry and irritable a lot.  H acuses he rof going off her meds.  Less mouth movements and biting tonue. Sleep 9-10 hours. Anxiety still through the roof. Tremor better right now unless weak.   H prostate CA with bone mets and on pain meds. Plan: Augment clozapine with fluvoxamine but increase 50mg  HS also to help anxiety, irritability  11/09/2022 appointment noted: Added fluvoxamine 50 mg HS.  And is less anxious and more grounded.  Half as irritable as before fluvoxamine.   Down side is shuffling steps seem worse.  Not dizzy usually but balance isn't good.  Gets better after the day progresses.    Overall does feel improved.   Trembling better and mouth movements much better but drooling is bad nothing seems to help that. Drools in public is embarrassing. Tired a lot better as day progresses.   Plan: Augment clozapine with fluvoxamine but REDUCE TO 37.5 mg mg HS bc more shuffling of feet .  And reduced clozapine to 50 mg daily.  But did help anxiety, irritability Ingrezza for TD helped stop tongue chewing but still some mouth movements at 80 mg, which was started 05/23/22.  Shuffling a little more and can't reduce Ingrezza.  Split ingrezza 40 BID  12/12/22 appt noted: Made changes as above.  Asks about increasing Focalin 20 BID bc lack of energy and motivation and productivity.  No SE.   No jittery,  HA. Usally sleep Is good but not always. Still shuffling if gets up at night or before morning Focalin.  Then it clears up.  No change in shuffling since here last visit.  Other day used H's walker.  Like losing balance.   Mouth movements still there but not nearly as bad.  Worse if forgets a dose of Ingrezza.   Mood depressed and more anxious than last visit.  Constantly obsessive thinking about things that could go wrong.  More short tempered.  Impaitent at home only. H got bad report from onc that current chemo not workingand it is  changed with limited success rate.  This affects her mood too.   No change in sleep.   Past Psychiatric Medication Trials: Vraylar 4.5 SE mouth movements reduced to 3 mg 3/20. It was effective at lower doses for depression.  Worse off it.  Vraylar 1.5 mg every third day led to relapse of significant depression. Caplyta SE at 42 mg .  Cost problems Latuda 80, , olanzapine, Seroquel, risperidone, Abilify, loxapine 25 mg BID (max tolerated) NR, Clozapine 50  Ingrezza 40 BID  lithium 150 tremor   Trileptal 450, Depakote, Equetro 300 hx balance issues, CBZ ER falling,   Lamictal 200 twice daily,  Focalin,  Ritalin,  fluoxetine 60,  sertraline 100, Wellbutrin history of facial tics, paroxetine cognitive side effects, Lexapro 10 worse buspirone,  ropinirole, amantadine, Sinemet, Artane, Cogentin,  pramipexole 0.5 mg BID helped tremor but caused anger trazodone hangover, Ambien hangover,  Review of Systems:  Review of Systems  HENT:  Positive for dental problem and tinnitus.        Chirping cricket sounds in hears since January 2023 drooling  Respiratory:  Positive for cough.   Cardiovascular:  Negative for chest pain.  Gastrointestinal:  Negative for abdominal pain and nausea.       GERD awakening her  Musculoskeletal:  Positive for arthralgias and gait problem.  Neurological:  Positive for dizziness and tremors.       Ankle problems and balance problems. No falls lately. Occ stumbles. Tremor is better in hands Mouth movements  Psychiatric/Behavioral:  Positive for dysphoric mood. Negative for agitation, behavioral problems, confusion, decreased concentration, hallucinations, self-injury, sleep disturbance and suicidal ideas. The patient is nervous/anxious. The patient is not hyperactive.   No falls since here. Not currently depressed but unable to remove this from the list.  Medications: I have reviewed the patient's current medications.  Current Outpatient Medications   Medication Sig Dispense Refill   acetaminophen (TYLENOL) 650 MG CR tablet Take 1,300 mg by mouth as needed for pain.     albuterol (VENTOLIN HFA) 108 (90 Base) MCG/ACT inhaler Inhale 2 puffs into the lungs every 4 (four) hours as needed for wheezing or shortness of breath. 18 g 0   atorvastatin (LIPITOR) 20 MG tablet TAKE (1) TABLET BY MOUTH AT BEDTIME. 90 tablet 0   cloZAPine (CLOZARIL) 25 MG tablet TAKE (3) TABLETS BY MOUTH AT BEDTIME. (Patient taking differently: Take 50 mg by mouth at bedtime. TAKE (3) TABLETS BY MOUTH AT BEDTIME.) 21 tablet 2   Ferrous Gluconate (IRON 27 PO) Take by mouth.     fluvoxaMINE (LUVOX) 25 MG tablet Take 2 tablets (50 mg total) by mouth at bedtime. (Patient taking differently: Take 37.5 mg by mouth at bedtime.) 180 tablet 0   FOCALIN 10 MG tablet Take 1.5 tablets (15 mg total) by mouth 2 (two) times daily. 90 tablet 0   guaiFENesin (MUCINEX) 600 MG 12 hr tablet Take 1 tablet (600 mg total) by mouth 2 (two) times daily as needed. 20 tablet 0   INGREZZA 40 MG capsule TAKE 1 CAPSULE BY MOUTH EVERY MORNING &  TAKE 1 CAPSULE BY MOUTH AT BEDTIME 60 capsule 2   ketoconazole (NIZORAL) 2 % cream Apply 1 Application topically daily. 60 g 0   lamoTRIgine (LAMICTAL) 100 MG tablet Take 1 tablet (100 mg total) by mouth 2 (two) times daily. 180 tablet 0   Melatonin 10 MG CAPS Take by mouth at bedtime as needed.     pantoprazole (PROTONIX) 40 MG tablet Take 1 tablet (40 mg total) by mouth 2 (two) times daily. 180 tablet 3   promethazine-dextromethorphan (PROMETHAZINE-DM) 6.25-15 MG/5ML syrup Take 5 mLs by mouth 4 (four) times daily as needed. 100 mL 0   No current facility-administered medications for this visit.    Medication Side Effects: Other: tremor and weight gain.   Dyskinesia appears better  SE bettter than they were.  Balance problems intermittently  Allergies:  Allergies  Allergen Reactions   Azithromycin Anaphylaxis   Penicillins Anaphylaxis    DID THE REACTION  INVOLVE: Swelling of the face/tongue/throat, SOB, or low BP? Yes Sudden or severe rash/hives, skin peeling, or the inside of the mouth or nose? Yes Did it require medical treatment? No When did it last happen?       If all above answers are "NO", may proceed with cephalosporin use.  Patient reacts to Z pack.  HAS Taken amoxicillin fine.   Adhesive [Tape] Other (See Comments)    On bandaids    Past Medical History:  Diagnosis Date   ADD (attention deficit disorder)    Allergy    Seasonal   Anemia    History of GI blood loss   Anxiety    Arthritis    Atrophy of vagina 10/07/2020   Bipolar 1 disorder    Cancer    Colon polyps    Depression    Diabetes mellitus    pt denies   Edema, lower extremity    Epistaxis    Around 2011 or 2012, required cauterization.    Esophageal stricture    Fracture of superior pubic ramus 11/28/2018   GERD (gastroesophageal reflux disease)    Headache(784.0)    Hyperlipidemia    Interstitial cystitis    Joint pain    Lactose intolerance    Lung cancer 2002   Neuromuscular disorder    Obesity    Osteoarthritis    Palpitations    Sleep apnea    Doesn't use a CPAP   Suicidal ideation 01/20/2020   Swallowing difficulty    Tardive dyskinesia     Family History  Problem Relation Age of Onset   Arthritis Mother    Hearing loss Mother    Hyperlipidemia Mother    Hypertension Mother    Depression Mother    Anxiety disorder Mother    Obesity Mother    Sudden death Mother    Hypertension Father    Diabetes Mellitus II Father    Heart disease Father    Arthritis Father    Cancer Father        Brain   COPD Father    Diabetes Father    Hyperlipidemia Father    Sleep apnea Father    Early death Sister        Aneroxia/Bulimic   Depression Brother    Early death Hydrographic surveyor Accident   Stroke Maternal Grandmother    Hypertension Maternal Grandmother    Arthritis Maternal Grandfather    Heart attack Maternal Grandfather     Hearing loss Maternal Grandfather  Depression Daughter    Drug abuse Daughter    Heart disease Daughter    Hypertension Daughter    Colon cancer Neg Hx    Esophageal cancer Neg Hx    Rectal cancer Neg Hx     Social History   Socioeconomic History   Marital status: Married    Spouse name: Not on file   Number of children: 1   Years of education: 12   Highest education level: Not on file  Occupational History   Occupation: retired  Tobacco Use   Smoking status: Never   Smokeless tobacco: Never  Vaping Use   Vaping Use: Never used  Substance and Sexual Activity   Alcohol use: Yes    Alcohol/week: 1.0 standard drink of alcohol    Types: 1 Glasses of wine per week    Comment: 1 glass wine q few weeks   Drug use: No   Sexual activity: Yes  Other Topics Concern   Not on file  Social History Narrative   [place] lives in [place] with husband [name].  Followed by Dr. Jennelle Human for psychiatry and Rockne Menghini for therapy.   Right handed   Drinks caffeine   One story home   Married lives with husband   retired   International aid/development worker of Corporate investment banker Strain: Low Risk  (05/11/2022)   Overall Financial Resource Strain (CARDIA)    Difficulty of Paying Living Expenses: Not hard at all  Food Insecurity: No Food Insecurity (05/11/2022)   Hunger Vital Sign    Worried About Running Out of Food in the Last Year: Never true    Ran Out of Food in the Last Year: Never true  Transportation Needs: No Transportation Needs (05/11/2022)   PRAPARE - Administrator, Civil Service (Medical): No    Lack of Transportation (Non-Medical): No  Physical Activity: Inactive (05/11/2022)   Exercise Vital Sign    Days of Exercise per Week: 0 days    Minutes of Exercise per Session: 0 min  Stress: No Stress Concern Present (05/09/2021)   [employer] of Occupational Health - Occupational Stress Questionnaire    Feeling of Stress : Only a little  Social Connections: Moderately  Integrated (05/11/2022)   Social Connection and Isolation Panel [NHANES]    Frequency of Communication with Friends and Family: More than three times a week    Frequency of Social Gatherings with Friends and Family: More than three times a week    Attends Religious Services: More than 4 times per year    Active Member of [employer] or Organizations: No    Attends Banker Meetings: Never    Marital Status: Married  Catering manager Violence: Not At Risk (05/09/2021)   Humiliation, Afraid, Rape, and Kick questionnaire    Fear of Current or Ex-Partner: No    Emotionally Abused: No    Physically Abused: No    Sexually Abused: No    Past Medical History, Surgical history, Social history, and Family history were reviewed and updated as appropriate.   Please see review of systems for further details on the patient's review from today.   Objective:   Physical Exam:  There were no vitals taken for this visit.  Physical Exam Constitutional:      General: She is not in acute distress. Musculoskeletal:        General: No deformity.  Neurological:     Mental Status: She is alert and oriented to person, place, and  time.     Cranial Nerves: No dysarthria.     Motor: Tremor present.     Coordination: Coordination normal.     Comments: Mild resting R hand rotational tremor better but not gone. Fidgety mildy feet better Some buccolingual tremor is better but residual Gait is not markedly abnormal for shuffling.  Psychiatric:        Attention and Perception: Attention and perception normal. She does not perceive auditory hallucinations.        Mood and Affect: Mood is anxious and depressed. Affect is not labile, blunt or angry.        Speech: Speech normal. Speech is not rapid and pressured.        Behavior: Behavior normal. Behavior is not agitated or slowed. Behavior is cooperative.        Thought Content: Thought content normal. Thought content is not paranoid or delusional. Thought  content does not include homicidal or suicidal ideation. Thought content does not include suicidal plan.        Cognition and Memory: Cognition and memory normal.        Judgment: Judgment normal.     Comments: Insight intact more mood cycling and still anxious. Without much change since last visit. Much less anxious with fluvoxamine Overall mood needs more improvement but depression better with clozapine but not resolved      Lab Review:     Component Value Date/Time   NA 141 09/27/2022 0950   K 4.0 09/27/2022 0950   CL 103 09/27/2022 0950   CO2 22 09/27/2022 0950   GLUCOSE 111 (H) 09/27/2022 0950   GLUCOSE 112 (H) 05/25/2022 1319   BUN 18 09/27/2022 0950   CREATININE 0.96 09/27/2022 0950   CALCIUM 9.6 09/27/2022 0950   PROT 7.0 09/27/2022 0950   ALBUMIN 4.5 09/27/2022 0950   AST 25 09/27/2022 0950   ALT 15 09/27/2022 0950   ALKPHOS 72 09/27/2022 0950   BILITOT 0.4 09/27/2022 0950   GFRNONAA >60 05/25/2022 1319   GFRAA 96 03/02/2020 1433       Component Value Date/Time   WBC 7.1 10/26/2022 1051   WBC 7.4 01/19/2020 1158   RBC 4.81 10/26/2022 1051   RBC 4.19 01/19/2020 1158   HGB 13.7 10/26/2022 1051   HCT 43.0 10/26/2022 1051   PLT 195 10/26/2022 1051   MCV 89 10/26/2022 1051   MCH 28.5 10/26/2022 1051   MCH 30.3 01/19/2020 1158   MCHC 31.9 10/26/2022 1051   MCHC 32.1 01/19/2020 1158   RDW 13.1 10/26/2022 1051   LYMPHSABS 2.2 10/26/2022 1051   MONOABS 0.7 04/04/2019 1004   EOSABS 0.2 10/26/2022 1051   BASOSABS 0.0 10/26/2022 1051  Vitamin D level 33 on 10K units daily on 12/4/`9 Increased to prescription vitamin d 50K units Monday, Wed, Friday.  Rx sent in.   Lithium Lvl  Date Value Ref Range Status  10/21/2018 0.18 (L) 0.60 - 1.20 mmol/L Final    Comment:    Performed at [facility], [address], [place], [place] [address]     No results found for: "PHENYTOIN", "PHENOBARB", "VALPROATE", "CBMZ"   .res Assessment: Plan:    Bipolar disorder with  moderate depression  Generalized anxiety disorder  Tardive dyskinesia  Attention deficit hyperactivity disorder (ADHD), predominantly inattentive type  Insomnia due to mental condition  Mild cognitive impairment  Low vitamin D level  Long term current use of clozapine  Tremor of both hands  Greater than 50% of 30 min face  to face time with patient was spent on counseling and coordination of care. We discussed multiple dxes and concerns.   Amanada has chronic rapid cycling bipolar disorder which is chronically unstable and has been difficult to control.  Failed 14 different mood stabilizers.  The rapid cycling is making it difficult to control frequency of depressive episodes and the anxiety as well.  We have typically had to make frequent med changes.   Disc gradual increase bc med sensitivity.  Med sensitivity to SE seems to be the biggest problem preventing better mood control  (Started clozapine on 12/07/21).   Having SE drooling and mild sedation.  Eventually sedation likely to get better. Check CBC with diff every other week until 12/08/22 then monthly  Augment clozapine with fluvoxamine but REDUCE TO 37.5 mg mg HS bc more shuffling of feet since the increase to 50 mg daily.  But did help anxiety, irritability  Ingrezza for TD helped stop tongue chewing but still some mouth movements at 80 mg, which was started 05/23/22.  Shuffling a little more and can't reduce Ingrezza.  Split ingrezza 40 BID  Disc ECT. Only FDA approved option left. However she decided to pursue clozapine and is up to 25 mg for 1 night. Extensive discussion of clozapine dosing recommendations but we will increase more slowly bc she is so med sensitive.Disc risk low WBC, cardiomyopathy, etc, sedation Disc change to  biweekly labs etc and REMS program.    Prone to UTI and may be causing confusion discussed.  Had confirmed UTI recently.  She has a high residual anxiety.  It has been impossible to control all of her  symptoms simultaneously without causing side effects. Failed various meds.  Discussed side effects of each medicine. Continue   Focalin IR 15 mg BID DT Cost and off label for depression  Try to spread this out if possible for mood.  She feels this is necessary  .  Consider increase per her request at next visit.  Lamotrigine 100 mg twice daily to try to reduce polypharmacy and so improve tolerability of clozapine.  consider reduction if clozapine helps.  Failed all reasonable alternatives for anxiety.  Option viibryd  Discussed potential metabolic side effects associated with atypical antipsychotics, as well as potential risk for movement side effects. Advised pt to contact office if movement side effects occur.    Checked B12 folate bc memory complaints.  Normal B12 & folate on 05/25/22  Disc SE meds and this is heightened by the complication of necessary polypharmacy.  Supportive therapy in terms of dealing with husband's addiction and now new dx metastatic prostate CA.Marland Kitchen Rec exercise as it would help.    Option mouth wash for drooling.  She wants to pursue this.  If compounded then to [facility]  Plan: First 2 weeks reduce Ingrezza to 60 mg at night to see if shuffling is better with samples. If shuffling is better call office for change in RX If so then increase clozapine back to 3 of the 25 mg capsules and fluvoxamine back to 50 mg .  Requires frequent FU DT chronic instability.  Wants to schedule monthly.  FU 4 weeks.   Meredith Staggers MD, DFAPA  Please see After Visit Summary for patient specific instructions.    Future Appointments  Date Time Provider Department Center  12/19/2022 10:00 AM Mathis Fare, LCSW [facility] None  01/09/2023 10:30 AM Cottle, Steva Ready., MD [facility] None  01/10/2023 10:00 AM Mathis Fare, LCSW [facility] None  01/31/2023 10:00 AM Mathis Fare, LCSW [facility] None  02/21/2023 10:00 AM Mathis Fare, LCSW [facility] None  03/27/2023  1:00 PM Anabel Halon, MD  [facility]    No orders of the defined types were placed in this encounter.      -------------------------------

## 2022-12-15 ENCOUNTER — Other Ambulatory Visit: Payer: Self-pay | Admitting: Psychiatry

## 2022-12-15 DIAGNOSIS — F319 Bipolar disorder, unspecified: Secondary | ICD-10-CM

## 2022-12-19 ENCOUNTER — Telehealth: Payer: Self-pay

## 2022-12-19 ENCOUNTER — Ambulatory Visit: Payer: No Typology Code available for payment source | Admitting: Psychiatry

## 2022-12-19 DIAGNOSIS — F3132 Bipolar disorder, current episode depressed, moderate: Secondary | ICD-10-CM | POA: Diagnosis not present

## 2022-12-19 NOTE — Telephone Encounter (Signed)
Pt was in to see her therapist, Megan Whitney today and asked to speak with nurse. She reports having apt with [name] last week on April 8th, and he reduced her Ingrezza to 60 mg, she feels that the issue isn't coming from that but the Fluvoxamine that she was put on a few months back. She reports she may not have explained herself well enough at the apt. She reports when she wakes up during the night she has to shuffle and hold on to the wall to keep from falling. She reports her husband has cancer and she needs to be more alert and able to function in case of emergency with him.  She reports nothing changed with the decrease in that medication.   Informed her I would discuss with [name] and get back with her.

## 2022-12-19 NOTE — Progress Notes (Signed)
[facility] Counselor/Therapist Progress Note  Patient ID: Megan Whitney, MRN: [id number],    Date: 12/19/2022  Time Spent: 55 minutes  Treatment Type: Individual Therapy  Reported Symptoms: depression, fear, anxious, worrying, motivation challenging  Mental Status Exam:  Appearance:   Casual     Behavior:  Appropriate and Sharing  Motor:  Normal  Speech/Language:   Clear and Coherent  Affect:  Depressed and anxious  Mood:  anxious and depressed  Thought process:  goal directed  Thought content:    Some obsessive thoughts  Sensory/Perceptual disturbances:    WNL  Orientation:  oriented to person, place, time/date, situation, day of week, month of year, year, and stated date of December 19, 2022  Attention:  Good  Concentration:  Fair  Memory:  WNL  Fund of knowledge:   Good  Insight:    Good and Fair  Judgment:   Fair  Impulse Control:  Good and Fair   Risk Assessment: Danger to Self:  No Self-injurious Behavior: No Danger to Others: No Duty to Warn:no Physical Aggression / Violence:No  Access to Firearms a concern: No  Gang Involvement:No   Subjective:   Patient in today and reports anxiety, depression, fear, worrying, and less motivation. Feels her depression is leading to less motivation. Husband still drinking some even "after med personnel have advised him not to do so or he will not be able to continue receiving his same pain med." Needed to talk through and process more of her worrying."Fears losing husband" and able to talk through this today acknowledging her fears, anxiety, and depression. Admits "jealousy of other people who seem to have it altogether." Depression "is so heavy on me." Denies any SI. Reports sleep "is pretty good" but sometimes "shuffles my feet when I get up during the night and it could affect my balance so have to be very careful." "My depression is really tough today, looking for negatives and expecting only negatives." Does feel better  when there's sun outside and got outside a little more the past 2 weeks.Looked at more activities she can do that can be helpful to her, other than overly focusing on her phone as she gravitates towards "upsetting news" which does not help her anxiety and depression. Processed more of her fears before leaving today and trying to interrupt her tendency to assume what may go wrong "and I've done it for years". Discussed her not making assumptions that are always in negative direction and trust herself more to be able to handle the stressors and uncertainties in life.   Interventions: Cognitive Behavioral Therapy and Ego-Supportive  Long Term Goal: Reduce overall level, frequency, and intensity of the anxiety so that daily functioning is not impaired. Short Term Goal: 1.Increase understanding of the beliefs and messages that produce the worry and anxiety. Strategies: 1.Help client develop reality-based positive cognitive messages/self-talk. 2. Develop a "coping card" or other reminder which coping strategies are recorded for patient's later use  Diagnosis:   ICD-10-CM   1. Bipolar disorder with moderate depression  F31.32      Plan:     Patient showing good participation in session today as she will work further on her anxiety and depression, worrying, assuming negatives. Denies and SI. States she hasn't been very motivated recently. Did show better motivation later in session and hopes to continue going forward. Is making progress and needs to continues working with her goal-directed behaviors to continue moving forward. Encouraged patient and practicing more positive  and self affirming behaviors as noted in session including: Staying in contact with supportive friends and family, believing more in herself and her ability to manage challenging circumstances, refrain from self sabotaging her goals, focus on making good decisions versus impulsive decisions, use of journaling between sessions, taking  breaks occasionally from her electronics, setting limits with others who are not supportive, finding the strengths and positives within herself, refrain from assuming worst-case scenarios, getting outside some each day, spending time with her 2 dogs which is very therapeutic for her, positive self talk, and recognize the strengths she shows when working with goal-directed behaviors to move forward in a direction that supports her improved emotional health and overall wellbeing.  Self-Rating Scale: 1-10 Depression Scale- 6  1-10 Anxiety Scale- 7/8  Goal review and progress/challenges noted with patient.  Next appointment within 2 to 3 weeks.  This record has been created using [employer].  Chart creation errors have been sought, but may not always have been located and corrected.  Such creation errors do not reflect on the standard of medical care provided.   Mathis Fare, LCSW

## 2022-12-19 NOTE — Telephone Encounter (Signed)
Pt notified of decrease.

## 2022-12-19 NOTE — Telephone Encounter (Signed)
Ok.  Reduce fluvoxamine from 1 and 1/2 tablets at night to 1/2 tablet twice daily.  Split dose to reduce SE

## 2022-12-20 DIAGNOSIS — Z79899 Other long term (current) drug therapy: Secondary | ICD-10-CM | POA: Diagnosis not present

## 2022-12-20 DIAGNOSIS — F319 Bipolar disorder, unspecified: Secondary | ICD-10-CM | POA: Diagnosis not present

## 2022-12-21 ENCOUNTER — Encounter: Payer: Self-pay | Admitting: Psychiatry

## 2022-12-27 ENCOUNTER — Other Ambulatory Visit: Payer: Self-pay | Admitting: Psychiatry

## 2022-12-27 DIAGNOSIS — F319 Bipolar disorder, unspecified: Secondary | ICD-10-CM

## 2023-01-02 ENCOUNTER — Other Ambulatory Visit: Payer: Self-pay | Admitting: Internal Medicine

## 2023-01-02 DIAGNOSIS — E7849 Other hyperlipidemia: Secondary | ICD-10-CM

## 2023-01-02 MED ORDER — ATORVASTATIN CALCIUM 20 MG PO TABS: 20.0000 mg | ORAL_TABLET | Freq: Every evening | ORAL | 3 refills | Status: DC

## 2023-01-04 ENCOUNTER — Ambulatory Visit: Payer: No Typology Code available for payment source | Admitting: Psychiatry

## 2023-01-04 DIAGNOSIS — F411 Generalized anxiety disorder: Secondary | ICD-10-CM

## 2023-01-04 DIAGNOSIS — F319 Bipolar disorder, unspecified: Secondary | ICD-10-CM | POA: Diagnosis not present

## 2023-01-04 DIAGNOSIS — Z79899 Other long term (current) drug therapy: Secondary | ICD-10-CM | POA: Diagnosis not present

## 2023-01-04 NOTE — Progress Notes (Signed)
[facility] Counselor/Therapist Progress Note  Patient ID: Megan Whitney, MRN: [id number],    Date: 01/04/2023  Time Spent: 55 minutes   Treatment Type: Individual Therapy  Reported Symptoms: anxiety, depression, no SI  Mental Status Exam:  Appearance:   Casual and Neat     Behavior:  Appropriate, Sharing, and Motivated  Motor:  Normal  Speech/Language:   Clear and Coherent  Affect:  anxious  Mood:  anxious and depressed  Thought process:  goal directed  Thought content:    Some obsessive thoughts  Sensory/Perceptual disturbances:    WNL  Orientation:  oriented to person, place, time/date, situation, day of week, month of year, year, and stated date of Jan 04, 2023  Attention:  Good  Concentration:  Good  Memory:  "Not too good but was evaluated by Dr and states she does not have dementia"   Fund of knowledge:   Good  Insight:    Good and Fair  Judgment:   Good  Impulse Control:  Good and Fair   Risk Assessment: Danger to Self:  No Self-injurious Behavior: No Danger to Others: No Duty to Warn:no Physical Aggression / Violence:No  Access to Firearms a concern: No  Gang Involvement:No   Subjective:  Patient in session today reporting anxiety and depression with anxiety being stronger symptom currently. Denies any SI. "My anxiety recently is pretty generalized but finances are a big issue, dog had eye surgery, issues with husband's cancer and use of alcohol angering patient. Some friends try to give advice but feels they don't really understand her struggles. Motivation is better, still worrying and fearful "about multiple things especially house issues, husband's cancer, and finances. States her anxiety is helped when she gets outside , moves around more, reading my devotions book, journaling, knitting, talking with friends, and praying. Gets really frustrated and worked on talking through some of her frustrations in order to "let go more" as her husband continues not  following through on medical recommendation, is being selfish and feels he doesn't care about patient, and he often sabotages his own medical treatment. Depression is "a little better" and that feels encouraging to patient. Trying to coach herself along during difficult times, spend more time with husband outside in yard, and trying to stay calmer and not assume negatives.   Interventions: Cognitive Behavioral Therapy, Solution-Oriented/Positive Psychology, and Ego-Supportive   Long Term Goal: Reduce overall level, frequency, and intensity of the anxiety so that daily functioning is not impaired. Short Term Goal: 1.Increase understanding of the beliefs and messages that produce the worry and anxiety. Strategies: 1.Help client develop reality-based positive cognitive messages/self-talk. 2. Develop a "coping card" or other reminder which coping strategies are recorded for patient's later use  Diagnosis:   ICD-10-CM   1. Generalized anxiety disorder  F41.1      Plan: Patient actively participating in session today focusing on her anxiety and depression and is showing progress in using more goal-directed behaviors and remembering some of the strategies better when stressors arise and she needs to use them.  Needs to continue working with goal-directed behaviors to keep moving in a forward direction. Encouraged patient in her practice of more positive and self-affirming behaviors as noted in session including: Focusing on making good decisions versus impulsive decisions, believing more in herself and her ability to manage challenging circumstances, refrain from self sabotaging in her goals, remain in contact with supportive friends and family, use of journaling between sessions, taking breaks occasionally from  her electronics, setting limits with others who are not supportive, refrain from assuming worst-case scenarios, finding the strengths and positives within herself, spending time with her 2 dogs  which is very therapeutic for her, positive self talk, and recognize the strength she shows when working with goal-directed behaviors to move forward in a direction that supports her improved emotional health and outlook.  Self-Rating Scale: 1-10 Depression Scale- 6  1-10 Anxiety Scale- 7/8  Goal review and progress/challenges noted with patient.  Next appointment within 2 to 3 weeks.   Mathis Fare, LCSW

## 2023-01-06 ENCOUNTER — Other Ambulatory Visit: Payer: Self-pay | Admitting: Psychiatry

## 2023-01-06 ENCOUNTER — Encounter: Payer: Self-pay | Admitting: Psychiatry

## 2023-01-06 DIAGNOSIS — F314 Bipolar disorder, current episode depressed, severe, without psychotic features: Secondary | ICD-10-CM

## 2023-01-09 ENCOUNTER — Ambulatory Visit: Payer: No Typology Code available for payment source | Admitting: Psychiatry

## 2023-01-09 ENCOUNTER — Encounter: Payer: Self-pay | Admitting: Psychiatry

## 2023-01-09 DIAGNOSIS — R251 Tremor, unspecified: Secondary | ICD-10-CM

## 2023-01-09 DIAGNOSIS — Z79899 Other long term (current) drug therapy: Secondary | ICD-10-CM

## 2023-01-09 DIAGNOSIS — F411 Generalized anxiety disorder: Secondary | ICD-10-CM

## 2023-01-09 DIAGNOSIS — G3184 Mild cognitive impairment, so stated: Secondary | ICD-10-CM

## 2023-01-09 DIAGNOSIS — F5105 Insomnia due to other mental disorder: Secondary | ICD-10-CM | POA: Diagnosis not present

## 2023-01-09 DIAGNOSIS — G2401 Drug induced subacute dyskinesia: Secondary | ICD-10-CM | POA: Diagnosis not present

## 2023-01-09 DIAGNOSIS — R7989 Other specified abnormal findings of blood chemistry: Secondary | ICD-10-CM

## 2023-01-09 DIAGNOSIS — F3132 Bipolar disorder, current episode depressed, moderate: Secondary | ICD-10-CM | POA: Diagnosis not present

## 2023-01-09 DIAGNOSIS — F319 Bipolar disorder, unspecified: Secondary | ICD-10-CM

## 2023-01-09 DIAGNOSIS — F900 Attention-deficit hyperactivity disorder, predominantly inattentive type: Secondary | ICD-10-CM

## 2023-01-09 DIAGNOSIS — F314 Bipolar disorder, current episode depressed, severe, without psychotic features: Secondary | ICD-10-CM | POA: Diagnosis not present

## 2023-01-09 MED ORDER — VILAZODONE HCL 10 MG PO TABS
ORAL_TABLET | ORAL | 0 refills | Status: DC
Start: 2023-01-09 — End: 2023-02-06

## 2023-01-09 MED ORDER — DEXMETHYLPHENIDATE HCL 10 MG PO TABS
15.0000 mg | ORAL_TABLET | Freq: Two times a day (BID) | ORAL | 0 refills | Status: DC
Start: 2023-01-09 — End: 2023-04-03

## 2023-01-09 MED ORDER — CLOZAPINE 25 MG PO TABS
ORAL_TABLET | ORAL | 3 refills | Status: DC
Start: 2023-01-09 — End: 2023-02-06

## 2023-01-09 NOTE — Progress Notes (Signed)
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 increase clozapine slurred speech and severe dizziness.   Mood is a little better.   Sleeping 8-9 hours.  So much better sleep with clozapine.   Still has anxiety, generalized. Plan: To minimize polypharmacy and improve tolerabilty:  Reduce Equetro to 1 of the 100 mg capsule at night for 1 week, then stop it. Wait 1 week then stop the carbamazepine chewable. Wait 1 more week then reduce clonidine to 1/2 at night for 1 week then stop it. Plan: Started clozapine  and continue 25 mg for now bc hasn't tolerated more so far.  02/08/22 appt noted: Mouth movements and biting tongue.  Sores on cheek with constant chewing movements. Sometimes hard to talk. Hypersalivation gets worse as day progresses. Sometimes balance problems.  Constipation managed.   Sleep very well.  8-9 hours. Off Equetro and on clozapine. Still has depression and anxiety without much change Plan: Started clozapine  and continue 25 mg for now bc hasn't tolerated more and need to start Ingrezza 40 mg for TD.  03/23/2022 appointment with the following noted: Several phone calls since here.   Has gotten up to clozapine 37.5 mg nightly. Continues Focalin 15 mg twice daily, lamotrigine 200 mg twice daily, lithium 150 nightly. Started Ingrezza 40 mg daily. Ingrezza amazing difference but even with grant of $10000 can't afford it.  Not biting mouth and mouth less sore.  Less mouth movements but not gone Emotionally not real well with anxiety and depression and crying spells.  Also some irritability and anger.  Easily triggered anger. Sleep more broken with Ingrezza HS but 8-9 hours. Can be sedated if gets up early with slurred speech but not if full night sleep. Balance better off Equetro. Plan: Started clozapine but needs to increase bc minimal effect and better tolerance, so increase to 50 mg HS  04/05/22 appt noted: Increased clozapine to 50 mg HS.  Some groggy in the AM.  One day was dizzy.  Otherwise on occasion.  Takes it right before bed.   Still depression, hopeless, irritability and anger.  Some periods of racing thoughts.  Sometimes recognizes hypomanic episodes and somethimes doesn't recognize. Dog is very sick and H with bone CA.  Not crying on clozapine as much. GERD and needs surgery for  hiatal hernia. Signed up for water aerobics. Plan: Started clozapine but needs to increase bc minimal effect and better tolerance, so increase to 75 mg HS (Started clozapine on 12/07/21) Reduce librium 5 mg HS and plan to stop  05/10/22 appt med: TD partially better with Ingrezza 40.  Has  a grant.   Increased clozapine 75 mg HS, reduced Librium to 5 mg HS. Tolerated OK. Depression a little better.  Irritability still high.  Poor memory and easily confused. Sleep is pretty good and is better and needs to sleep longer.   Plan: DC librium Worsening TD partial response on 40 mg Ingrezza, increase to 60 mg daily   Continue clozapine 100 mg HS  05/17/2022 phone call complaining of sleeping more and feeling sleepy and foggy thinking also dropping some things and drooling.  She was instructed to  reduce the clozapine to 75 mg nightly to see if that was the problem. 05/23/2022 phone call asking to increase Ingrezza from 60 to 80 mg daily.  It was agreed. 06/16/2022 phone call stating she had a bad manic episode the week prior and is still feeling excessively sedated.  Also having hand tremors. Instructed to stop lithium and continue clozapine 75 mg nightly.  She is very med sensitive but we have few options left to treat her unstable bipolar disorder. 06/21/2022 phone call: After complaining of excessive sleepiness and excessive sleeping she is now complaining of insomnia.  07/07/22 appt noted: Current psychiatric medications include clozapine 75 mg nightly, Focalin 15 mg twice daily, lamotrigine 100 mg twice daily Ingrezza 80 mg daily.  She stopped lithium Has a list of concerns: SE drooling bad.   Still have mouth movements with the increase Ingrezza 80 mg daily but has stopped the tongue chewing. Goes to bed 9 PM and to sleep in 30 mins and awaken in the AM about 830 and hard to wake up.  Not napping in the day.  Getting enough sleep.   Notices Focalin kicking in when takes it. Mood depressed but not as bad.  Still some irritability, anger.  3 week ago bad manic anger lasting 3-4 days.  Not sure how her sleep was at the time. Some crying and poor impulse control.   PCP wanted 2nd opinion from neuro on ? PD, Dr. Arbutus Leas Nov 15. Plan: No med changes pending neurologic appointment  07/20/2022 neurology appointment Dr. Lurena Joiner Tat.  Diagnosed TD.  Assessment as follows: 1.  Tardive dyskinesia -The patients symptoms are most consistent with tardive dyskinesia.  She has had exposure to typical and atypical antipsychotic medication.  TD is a heterogeneous syndrome depending on a subtle balance between several neurotransmitters in the brain, including DA receptor blockade and hypersensitivity of DA and GABA receptors. -pt on ingrezza since 02/2022 and both she and notes from Dr. Jennelle Human indicate great  benefit.  2.  Tremor             -Largely resolved off of lithium and vraylar (vraylar d/c in 12/2021)             -She has very minor left hand tremor.  Did tell her that Ingrezza can occasionally cause parkinsonism, but I did not see a significant degree of that today.             -I did reassure her today that I saw no evidence of idiopathic Parkinson's disease.  She was reassured.  3.  Bipolar d/o             -  difficult to control per records             -sees Dr. Jennelle Human frequently  4.  Discussed with patient that she really does not need neurologic follow-up at this point in time.  She was happy to hear this.  08/08/2022 appointment noted: No med changes. Still having a lot of anxiety and worries too much. Worse than depression.  No mania since here.  No sig avoidance.   Hard time concentration. Still having irritability and anger. SE drowsy with clozapine in AM and hard to function until about 10 AM.  Takes it about 8 PM and then goes to bed.   No falling but is shuffling more since here.   Sleep 10 hours.    09/07/22 appt noted:   Consistently on clozapine 75 mg HS.  Too drowsy if takes 25 mg in AM. Doing so so .  A lot of anxiety, anger, irritation.   Avg 8-9 hours of sleep and pushes herself to get up .  Hard to function in am until 11 or 12 noon. Ingreza 40 mg BID still some mouth movements and biting tongue.  Is better with Ingrezza but not gone.   SE consitpation and drowsy.   She is aware of the difficulty finding balance between aenough med to manage her sx and not so much to cause intolerable SE.  Disc dosing of her meds. Plan: Augment clozapine with fluvoxamine 25 mg HS  10/11/22 appt noted: : Current psych meds: Clozapine 75 mg nightly, Focalin 15 mg twice daily, fluvoxamine 25 mg nightly, lamotrigine 100 mg twice daily, Ingrezza 40 mg twice daily No noticeable change with fluvoxamine. Drooling worse in am and stupor until about 11 AM.   Mood still not good , angry and  irritable a lot.  H acuses he rof going off her meds.  Less mouth movements and biting tonue. Sleep 9-10 hours. Anxiety still through the roof. Tremor better right now unless weak.   H prostate CA with bone mets and on pain meds. Plan: Augment clozapine with fluvoxamine but increase 50mg  HS also to help anxiety, irritability  11/09/2022 appointment noted: Added fluvoxamine 50 mg HS.  And is less anxious and more grounded.  Half as irritable as before fluvoxamine.   Down side is shuffling steps seem worse.  Not dizzy usually but balance isn't good.  Gets better after the day progresses.    Overall does feel improved.   Trembling better and mouth movements much better but drooling is bad nothing seems to help that. Drools in public is embarrassing. Tired a lot better as day progresses.   Plan: Augment clozapine with fluvoxamine but REDUCE TO 37.5 mg mg HS bc more shuffling of feet .  And reduced clozapine to 50 mg daily.  But did help anxiety, irritability Ingrezza for TD helped stop tongue chewing but still some mouth movements at 80 mg, which was started 05/23/22.  Shuffling a little more and can't reduce Ingrezza.  Split ingrezza 40 BID  12/12/22 appt noted: Made changes as above.  Asks about increasing Focalin 20 BID bc lack of energy and motivation and productivity.  No SE.   No jittery,  HA. Usally sleep Is good but not always. Still shuffling if gets up at night or before morning Focalin.  Then it clears up.  No change in shuffling since here last visit.  Other day used H's walker.  Like losing balance.   Mouth movements still there but not nearly as  bad.  Worse if forgets a dose of Ingrezza.   Mood depressed and more anxious than last visit.  Constantly obsessive thinking about things that could go wrong.  More short tempered.  Impaitent at home only. H got bad report from onc that current chemo not workingand it is changed with limited success rate.  This affects her mood too.   No change  in sleep. Plan: Plan: First 2 weeks reduce Ingrezza to 60 mg at night to see if shuffling is better with samples. If shuffling is better call office for change in RX If so then increase clozapine back to 3 of the 25 mg capsules and fluvoxamine back to 50 mg .  12/19/22 TC with nurse: Lambert Keto, LPN   TS   1/61/09 11:16 AM Note Pt was in to see her therapist, Rockne Menghini today and asked to speak with nurse. She reports having apt with Dr. Jennelle Human last week on April 8th, and he reduced her Ingrezza to 60 mg, she feels that the issue isn't coming from that but the Fluvoxamine that she was put on a few months back. She reports she may not have explained herself well enough at the apt. She reports when she wakes up during the night she has to shuffle and hold on to the wall to keep from falling. She reports her husband has cancer and she needs to be more alert and able to function in case of emergency with him.  She reports nothing changed with the decrease in that medication.    Informed her I would discuss with Dr. Jennelle Human and get back with her.     12/19/22 MD resp:  Rip Harbour.  Reduce fluvoxamine from 1 and 1/2 tablets at night to 1/2 tablet twice daily.  Split dose to reduce SE      01/09/23 appt noted: Meds: clozapine 50 mg HS, reduced fluvoxamine 12.5 mg Am, forcalin 15 BID, Ingrezza 40 BID, lamotrigine 100 BID,  Not doing well.  Dep and high anxiety.  Some irritability.  Mood swings not dramatic.  No SI. Balance issues when up in middle of night.  Does better once takes morning meds with focalin.   Sometimes forgets afternoon Focalin. Mouth movements still there but better.  Drooling stopped. A lot of things seem like too much working.  Still some wiggling toes and may chew side of mouth , not severe.  Past Psychiatric Medication Trials: Vraylar 4.5 SE mouth movements reduced to 3 mg 3/20. It was effective at lower doses for depression.  Worse off it.  Vraylar 1.5 mg every third day led  to relapse of significant depression. Caplyta SE at 42 mg .  Cost problems Latuda 80, , olanzapine, Seroquel, risperidone, Abilify, loxapine 25 mg BID (max tolerated) NR, Clozapine 50  Ingrezza 40 BID  lithium 150 tremor   Trileptal 450, Depakote, Equetro 300 hx balance issues, CBZ ER falling,   Lamictal 200 twice daily,  Focalin,  Ritalin,  fluoxetine 60,  sertraline 100, Wellbutrin history of facial tics, paroxetine cognitive side effects, Lexapro 10 worse Fluvoxamine 50  buspirone,  ropinirole, amantadine, Sinemet, Artane, Cogentin,  pramipexole 0.5 mg BID helped tremor but caused anger trazodone hangover, Ambien hangover,    Review of Systems:  Review of Systems  HENT:  Positive for dental problem and tinnitus.        Chirping cricket sounds in hears since January 2023 drooling  Respiratory:  Positive for cough.   Cardiovascular:  Negative for chest pain.  Gastrointestinal:  Negative for abdominal pain and nausea.       GERD awakening her  Musculoskeletal:  Positive for arthralgias and gait problem.  Neurological:  Positive for dizziness and tremors.       Ankle problems and balance problems. No falls lately. Occ stumbles. Tremor is better in hands Mouth movements  Psychiatric/Behavioral:  Positive for dysphoric mood. Negative for agitation, behavioral problems, confusion, decreased concentration, hallucinations, self-injury, sleep disturbance and suicidal ideas. The patient is nervous/anxious. The patient is not hyperactive.   No falls since here. Not currently depressed but unable to remove this from the list.  Medications: I have reviewed the patient's current medications.  Current Outpatient Medications  Medication Sig Dispense Refill   acetaminophen (TYLENOL) 650 MG CR tablet Take 1,300 mg by mouth as needed for pain.     albuterol (VENTOLIN HFA) 108 (90 Base) MCG/ACT inhaler Inhale 2 puffs into the lungs every 4 (four) hours as needed for wheezing or shortness  of breath. 18 g 0   atorvastatin (LIPITOR) 20 MG tablet Take 1 tablet (20 mg total) by mouth every evening. 90 tablet 3   cloZAPine (CLOZARIL) 25 MG tablet TAKE (3) TABLETS BY MOUTH AT BEDTIME. 21 tablet 3   Ferrous Gluconate (IRON 27 PO) Take by mouth.     fluvoxaMINE (LUVOX) 25 MG tablet Take 2 tablets (50 mg total) by mouth at bedtime. (Patient taking differently: Take 37.5 mg by mouth at bedtime.) 180 tablet 0   FOCALIN 10 MG tablet Take 1.5 tablets (15 mg total) by mouth 2 (two) times daily. 90 tablet 0   guaiFENesin (MUCINEX) 600 MG 12 hr tablet Take 1 tablet (600 mg total) by mouth 2 (two) times daily as needed. 20 tablet 0   INGREZZA 40 MG capsule TAKE 1 CAPSULE BY MOUTH EVERY MORNING & TAKE 1 CAPSULE BY MOUTH AT BEDTIME 60 capsule 2   ketoconazole (NIZORAL) 2 % cream Apply 1 Application topically daily. 60 g 0   lamoTRIgine (LAMICTAL) 100 MG tablet Take 1 tablet (100 mg total) by mouth 2 (two) times daily. 180 tablet 0   Melatonin 10 MG CAPS Take by mouth at bedtime as needed.     pantoprazole (PROTONIX) 40 MG tablet Take 1 tablet (40 mg total) by mouth 2 (two) times daily. 180 tablet 3   promethazine-dextromethorphan (PROMETHAZINE-DM) 6.25-15 MG/5ML syrup Take 5 mLs by mouth 4 (four) times daily as needed. 100 mL 0   No current facility-administered medications for this visit.    Medication Side Effects: Other: tremor and weight gain.   Dyskinesia appears better  SE bettter than they were.  Balance problems intermittently  Allergies:  Allergies  Allergen Reactions   Azithromycin Anaphylaxis   Penicillins Anaphylaxis    DID THE REACTION INVOLVE: Swelling of the face/tongue/throat, SOB, or low BP? Yes Sudden or severe rash/hives, skin peeling, or the inside of the mouth or nose? Yes Did it require medical treatment? No When did it last happen?       If all above answers are "NO", may proceed with cephalosporin use.  Patient reacts to Z pack.  HAS Taken amoxicillin fine.    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    Esophageal stricture    Fracture of superior pubic ramus (HCC) 11/28/2018   GERD (gastroesophageal reflux disease)    Headache(784.0)    Hyperlipidemia    Interstitial cystitis    Joint pain    Lactose intolerance    Lung cancer (HCC) 2002   Neuromuscular disorder (HCC)    Obesity    Osteoarthritis    Palpitations    Sleep apnea    Doesn't use a CPAP   Suicidal ideation 01/20/2020   Swallowing difficulty    Tardive dyskinesia     Family History  Problem Relation Age of Onset   Arthritis Mother    Hearing loss Mother    Hyperlipidemia Mother    Hypertension Mother    Depression Mother    Anxiety disorder Mother    Obesity Mother    Sudden death Mother    Hypertension Father    Diabetes Mellitus II Father    Heart disease Father    Arthritis Father    Cancer Father        Brain   COPD Father    Diabetes Father    Hyperlipidemia Father    Sleep apnea Father    Early death Sister        Aneroxia/Bulimic   Depression Brother    Early death Hydrographic surveyor Accident   Stroke Maternal Grandmother    Hypertension Maternal Grandmother    Arthritis Maternal Grandfather    Heart attack Maternal Grandfather    Hearing loss Maternal Grandfather    Depression Daughter    Drug abuse Daughter    Heart disease Daughter    Hypertension Daughter    Colon cancer Neg Hx    Esophageal cancer Neg Hx    Rectal cancer Neg Hx     Social History   Socioeconomic History   Marital status: Married    Spouse name: Not on  file   Number of children: 1   Years of education: 12   Highest education level: Not on file  Occupational History   Occupation: retired  Tobacco Use   Smoking status: Never   Smokeless tobacco: Never  Vaping Use   Vaping Use: Never used  Substance and Sexual Activity   Alcohol use: Yes    Alcohol/week: 1.0 standard drink of alcohol    Types: 1 Glasses of wine per week    Comment: 1 glass wine q few weeks   Drug use: No   Sexual activity: Yes  Other Topics Concern   Not on file  Social History Narrative   Pt lives in [place] with husband [name].  Followed by Dr. Jennelle Human for psychiatry and Rockne Menghini for therapy.   Right handed   Drinks caffeine   One story home   Married lives with husband   retired   International aid/development worker of Corporate investment banker Strain: Low Risk  (05/11/2022)   Overall Financial Resource Strain (CARDIA)    Difficulty of Paying Living Expenses: Not hard at all  Food Insecurity: No Food Insecurity (05/11/2022)   Hunger Vital Sign    Worried About Running Out of Food in the Last Year: Never true  Ran Out of Food in the Last Year: Never true  Transportation Needs: No Transportation Needs (05/11/2022)   PRAPARE - Administrator, Civil Service (Medical): No    Lack of Transportation (Non-Medical): No  Physical Activity: Inactive (05/11/2022)   Exercise Vital Sign    Days of Exercise per Week: 0 days    Minutes of Exercise per Session: 0 min  Stress: No Stress Concern Present (05/09/2021)   [employer] of Occupational Health - Occupational Stress Questionnaire    Feeling of Stress : Only a little  Social Connections: Moderately Integrated (05/11/2022)   Social Connection and Isolation Panel [NHANES]    Frequency of Communication with Friends and Family: More than three times a week    Frequency of Social Gatherings with Friends and Family: More than three times a week    Attends Religious Services: More than 4 times per year    Active  Member of [employer] or Organizations: No    Attends Banker Meetings: Never    Marital Status: Married  Catering manager Violence: Not At Risk (05/09/2021)   Humiliation, Afraid, Rape, and Kick questionnaire    Fear of Current or Ex-Partner: No    Emotionally Abused: No    Physically Abused: No    Sexually Abused: No    Past Medical History, Surgical history, Social history, and Family history were reviewed and updated as appropriate.   Please see review of systems for further details on the patient's review from today.   Objective:   Physical Exam:  There were no vitals taken for this visit.  Physical Exam Constitutional:      General: She is not in acute distress. Musculoskeletal:        General: No deformity.  Neurological:     Mental Status: She is alert and oriented to person, place, and time.     Cranial Nerves: No dysarthria.     Motor: Tremor present.     Coordination: Coordination normal.     Comments: Mild resting R hand rotational tremor better minimal. Fidgety  better Some buccolingual  is better but residual Gait is normal  Psychiatric:        Attention and Perception: Attention and perception normal. She does not perceive auditory hallucinations.        Mood and Affect: Mood is anxious and depressed. Affect is not labile, blunt or angry.        Speech: Speech normal. Speech is not rapid and pressured.        Behavior: Behavior normal. Behavior is not agitated or slowed. Behavior is cooperative.        Thought Content: Thought content normal. Thought content is not paranoid or delusional. Thought content does not include homicidal or suicidal ideation. Thought content does not include suicidal plan.        Cognition and Memory: Cognition and memory normal.        Judgment: Judgment normal.     Comments: Insight intact more mood cycling and still anxious. Without much change since last visit. Much less anxious with fluvoxamine Overall mood needs more  improvement but depression better with clozapine but not resolved      Lab Review:     Component Value Date/Time   NA 141 09/27/2022 0950   K 4.0 09/27/2022 0950   CL 103 09/27/2022 0950   CO2 22 09/27/2022 0950   GLUCOSE 111 (H) 09/27/2022 0950   GLUCOSE 112 (H) 05/25/2022 1319   BUN 18  09/27/2022 0950   CREATININE 0.96 09/27/2022 0950   CALCIUM 9.6 09/27/2022 0950   PROT 7.0 09/27/2022 0950   ALBUMIN 4.5 09/27/2022 0950   AST 25 09/27/2022 0950   ALT 15 09/27/2022 0950   ALKPHOS 72 09/27/2022 0950   BILITOT 0.4 09/27/2022 0950   GFRNONAA >60 05/25/2022 1319   GFRAA 96 03/02/2020 1433       Component Value Date/Time   WBC 7.1 10/26/2022 1051   WBC 7.4 01/19/2020 1158   RBC 4.81 10/26/2022 1051   RBC 4.19 01/19/2020 1158   HGB 13.7 10/26/2022 1051   HCT 43.0 10/26/2022 1051   PLT 195 10/26/2022 1051   MCV 89 10/26/2022 1051   MCH 28.5 10/26/2022 1051   MCH 30.3 01/19/2020 1158   MCHC 31.9 10/26/2022 1051   MCHC 32.1 01/19/2020 1158   RDW 13.1 10/26/2022 1051   LYMPHSABS 2.2 10/26/2022 1051   MONOABS 0.7 04/04/2019 1004   EOSABS 0.2 10/26/2022 1051   BASOSABS 0.0 10/26/2022 1051  Vitamin D level 33 on 10K units daily on 12/4/`9 Increased to prescription vitamin d 50K units Monday, Wed, Friday.  Rx sent in.   Lithium Lvl  Date Value Ref Range Status  10/21/2018 0.18 (L) 0.60 - 1.20 mmol/L Final    Comment:    Performed at [facility], [address] [place], [place] [address]     No results found for: "PHENYTOIN", "PHENOBARB", "VALPROATE", "CBMZ"   .res Assessment: Plan:    No diagnosis found.  Greater than 50% of 30 min face to face time with patient was spent on counseling and coordination of care. We discussed multiple dxes and concerns.   Dhanvi has chronic rapid cycling bipolar disorder which is chronically unstable and has been difficult to control.  Failed 14 different mood stabilizers.  The rapid cycling is making it difficult to control frequency  of depressive episodes and the anxiety as well.  We have typically had to make frequent med changes.   Disc gradual increase bc med sensitivity.  Med sensitivity to SE seems to be the biggest problem preventing better mood control  (Started clozapine on 12/07/21).   Having SE drooling and mild sedation.  Eventually sedation likely to get better. Check CBC with diff every other week until 12/08/22 then monthly Continue Clozapine 50 mg HS  DC fluvoxamine and off lablel trial alternatives below.  Ingrezza for TD helped stop tongue chewing but still some mouth movements at 80 mg, which was started 05/23/22.  Shuffling a little more and can't reduce Ingrezza.  Split ingrezza 40 BID Consider Austedo.  Disc ECT. Only FDA approved option left. She wants to defer.    Extensive discussion of clozapine dosing recommendations but we will increase more slowly bc she is so med sensitive.Disc risk low WBC, cardiomyopathy, etc, sedation Disc change to  biweekly labs etc and REMS program.    Prone to UTI and may be causing confusion discussed.  Had confirmed UTI recently.  She has a high residual anxiety.  It has been impossible to control all of her symptoms simultaneously without causing side effects. Failed various meds.  Discussed side effects of each medicine. Continue   Focalin IR 15 mg BID DT Cost and off label for depression  Try to spread this out if possible for mood.  She feels this is necessary  .  Consider increase per her request at next visit.  Lamotrigine 100 mg twice daily to try to reduce polypharmacy and so improve tolerability  of clozapine.  consider reduction if clozapine helps.  Failed all reasonable alternatives for anxiety.  Option viibryd, Auvelity  Discussed potential metabolic side effects associated with atypical antipsychotics, as well as potential risk for movement side effects. Advised pt to contact office if movement side effects occur.    Checked B12 folate bc memory  complaints.  Normal B12 & folate on 05/25/22  Disc SE meds and this is heightened by the complication of necessary polypharmacy.  Supportive therapy in terms of dealing with husband's addiction and now new dx metastatic prostate CA.Marland Kitchen Rec exercise as it would help.    Plan: DC fluvoxamine Trial Viibryd 5 mg daily for 1 week then 10 mg daily.  Requires frequent FU DT chronic instability.  Wants to schedule monthly.  FU 4 weeks.   Meredith Staggers MD, DFAPA  Please see After Visit Summary for patient specific instructions.    Future Appointments  Date Time Provider Department Center  01/31/2023 10:00 AM Mathis Fare, LCSW [facility] None  02/09/2023 10:30 AM Cottle, Steva Ready., MD [facility] None  02/21/2023 10:00 AM Mathis Fare, LCSW [facility] None  03/13/2023 10:00 AM Mathis Fare, LCSW [facility] None  03/14/2023 10:30 AM Cottle, Steva Ready., MD [facility] None  03/27/2023  1:00 PM Anabel Halon, MD [facility]  04/03/2023 10:00 AM Mathis Fare, LCSW [facility] None  04/24/2023 10:00 AM Mathis Fare, LCSW [facility] None    No orders of the defined types were placed in this encounter.      -------------------------------

## 2023-01-10 ENCOUNTER — Ambulatory Visit: Payer: No Typology Code available for payment source | Admitting: Psychiatry

## 2023-01-17 DIAGNOSIS — Z79899 Other long term (current) drug therapy: Secondary | ICD-10-CM | POA: Diagnosis not present

## 2023-01-17 DIAGNOSIS — F319 Bipolar disorder, unspecified: Secondary | ICD-10-CM | POA: Diagnosis not present

## 2023-01-18 ENCOUNTER — Encounter: Payer: Self-pay | Admitting: Psychiatry

## 2023-01-25 ENCOUNTER — Ambulatory Visit: Payer: No Typology Code available for payment source | Admitting: Psychiatry

## 2023-01-25 DIAGNOSIS — F411 Generalized anxiety disorder: Secondary | ICD-10-CM | POA: Diagnosis not present

## 2023-01-25 NOTE — Progress Notes (Signed)
[facility] Counselor/Therapist Progress Note  Patient ID: OUMY EUDY, MRN: [id number],    Date: 01/25/2023  Time Spent:  48 minutes  Treatment Type: Individual Therapy  Reported Symptoms: anxiety, depression  Mental Status Exam:  Appearance:   Casual and Neat     Behavior:  Appropriate, Sharing, and Motivated  Motor:  Normal  Speech/Language:   Clear and Coherent  Affect:  Anxious, depressed  Mood:  anxious and depressed  Thought process:  goal directed  Thought content:    Obsessive thoughts  Sensory/Perceptual disturbances:    WNL  Orientation:  oriented to person, place, time/date, situation, day of week, month of year, year, and stated date of Jan 25, 2023  Attention:  Good  Concentration:  Good and Fair  Memory:  WNL  Fund of knowledge:   Good  Insight:    Good and Fair  Judgment:   Fair  Impulse Control:  Good and Fair   Risk Assessment: Danger to Self:  No Self-injurious Behavior: No Danger to Others: No Duty to Warn:no Physical Aggression / Violence:No  Access to Firearms a concern: No  Gang Involvement:No   Subjective: Patient in today reporting anxiety and depression, with anxiety being the stronger symptom.  Feeling trapped with husband's continued drinking as she can't control him and tight finances are stressful. Issues re: his medication and treatment, communication with treatment staff, and guilt feelings.  Work today on limit setting, better managing her anxiety and depression, and using more positive self talk and self-care.  Tearful initially but work through some issues which seem to really helped her today to gain more strength and relax on the self criticalness.  To continue working on this between now and next session.  Interventions: Cognitive Behavioral Therapy and Ego-Supportive  Long Term Goal: Reduce overall level, frequency, and intensity of the anxiety so that daily functioning is not impaired. Short Term Goal: 1.Increase  understanding of the beliefs and messages that produce the worry and anxiety. Strategies: 1.Help client develop reality-based positive cognitive messages/self-talk. 2. Develop a "coping card" or other reminder which coping strategies are recorded for patient's later use  Diagnosis:   ICD-10-CM   1. Generalized anxiety disorder  F41.1      Plan:   Patient in today and actively working on her anxiety and depression with anxiety being the strongest symptom.  Has been feeling trapped with husband's continued drinking and also getting medical treatment for cancer she.  Patient was able to work through some significant issues today and seem to be feeling better by session and.  She is making progress and needs to continue working with goal-directed behaviors in order to keep moving forward.Encouraged patient and practicing more positive and self affirming behaviors as noted in session including: Focusing on making good decisions versus impulsive decisions, believing more in herself and her ability to manage challenging circumstances, refrain from self sabotaging in her goals, remain in contact with supportive friends and family, use of journaling between sessions, taking breaks occasionally from her electronics, setting limits with others who are not supportive, refrain from assuming worst-case scenarios, find the strengths and positives within herself, spending time with her 2 dogs which is very therapeutic for her, positive self talk, recognize the strengths she shows when working with goal-directed behaviors to move forward in a direction that supports her improved emotional health and overall wellbeing.   Self-Rating Scale: 1-10 Depression Scale- 6  1-10 Anxiety Scale- 7  Goal review and progress/challenges  Next appt within 2-3 weeks   Mathis Fare, LCSW

## 2023-01-31 ENCOUNTER — Ambulatory Visit: Payer: No Typology Code available for payment source | Admitting: Psychiatry

## 2023-01-31 ENCOUNTER — Other Ambulatory Visit: Payer: Self-pay | Admitting: Psychiatry

## 2023-01-31 DIAGNOSIS — F3132 Bipolar disorder, current episode depressed, moderate: Secondary | ICD-10-CM

## 2023-02-01 DIAGNOSIS — F319 Bipolar disorder, unspecified: Secondary | ICD-10-CM | POA: Diagnosis not present

## 2023-02-01 DIAGNOSIS — Z79899 Other long term (current) drug therapy: Secondary | ICD-10-CM | POA: Diagnosis not present

## 2023-02-02 ENCOUNTER — Encounter: Payer: Self-pay | Admitting: Psychiatry

## 2023-02-06 ENCOUNTER — Ambulatory Visit: Payer: No Typology Code available for payment source | Admitting: Psychiatry

## 2023-02-06 ENCOUNTER — Encounter: Payer: Self-pay | Admitting: Psychiatry

## 2023-02-06 DIAGNOSIS — G3184 Mild cognitive impairment, so stated: Secondary | ICD-10-CM

## 2023-02-06 DIAGNOSIS — F411 Generalized anxiety disorder: Secondary | ICD-10-CM | POA: Diagnosis not present

## 2023-02-06 DIAGNOSIS — F900 Attention-deficit hyperactivity disorder, predominantly inattentive type: Secondary | ICD-10-CM

## 2023-02-06 DIAGNOSIS — F3132 Bipolar disorder, current episode depressed, moderate: Secondary | ICD-10-CM | POA: Diagnosis not present

## 2023-02-06 DIAGNOSIS — R7989 Other specified abnormal findings of blood chemistry: Secondary | ICD-10-CM | POA: Diagnosis not present

## 2023-02-06 DIAGNOSIS — F5105 Insomnia due to other mental disorder: Secondary | ICD-10-CM

## 2023-02-06 DIAGNOSIS — F319 Bipolar disorder, unspecified: Secondary | ICD-10-CM | POA: Diagnosis not present

## 2023-02-06 DIAGNOSIS — G2401 Drug induced subacute dyskinesia: Secondary | ICD-10-CM

## 2023-02-06 DIAGNOSIS — Z79899 Other long term (current) drug therapy: Secondary | ICD-10-CM | POA: Diagnosis not present

## 2023-02-06 DIAGNOSIS — R251 Tremor, unspecified: Secondary | ICD-10-CM

## 2023-02-06 MED ORDER — CLOZAPINE 25 MG PO TABS
ORAL_TABLET | ORAL | 3 refills | Status: DC
Start: 2023-02-06 — End: 2023-04-10

## 2023-02-06 MED ORDER — VILAZODONE HCL 10 MG PO TABS
ORAL_TABLET | ORAL | 0 refills | Status: DC
Start: 2023-02-06 — End: 2023-04-10

## 2023-02-06 MED ORDER — VALBENAZINE TOSYLATE 60 MG PO CAPS
60.0000 mg | ORAL_CAPSULE | Freq: Every day | ORAL | 1 refills | Status: DC
Start: 2023-02-06 — End: 2023-02-19

## 2023-02-06 NOTE — Progress Notes (Signed)
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 tongue.  Sometimes hard to talk.  Drooling.  When tries to increase clozapine slurred speech and severe dizziness.   Mood is a little better.   Sleeping 8-9 hours.  So much better sleep with clozapine.   Still has anxiety, generalized. Plan: To minimize polypharmacy and improve tolerabilty:  Reduce Equetro to 1 of the 100 mg capsule at night for 1 week, then stop it. Wait 1 week then stop the carbamazepine chewable. Wait 1 more week then reduce clonidine to 1/2 at night for 1 week then stop it. Plan: Started clozapine  and continue 25 mg for now bc hasn't tolerated more so far.  02/08/22 appt noted: Mouth movements and biting tongue.  Sores on cheek with constant chewing movements. Sometimes hard to talk. Hypersalivation gets worse as day progresses. Sometimes balance problems.  Constipation managed.   Sleep very well.  8-9 hours. Off Equetro and on clozapine. Still has depression and anxiety without much change Plan: Started clozapine  and continue 25 mg for now bc hasn't tolerated more and need to start Ingrezza 40 mg for TD.  03/23/2022  appointment with the following noted: Several phone calls since here.  Has gotten up to clozapine 37.5 mg nightly. Continues Focalin 15 mg twice daily, lamotrigine 200 mg twice daily, lithium 150 nightly. Started Ingrezza 40 mg daily. Ingrezza amazing difference but even with grant of $10000 can't afford it.  Not biting mouth and mouth less sore.  Less mouth movements but not gone Emotionally not real well with anxiety and depression and crying spells.  Also some irritability and anger.  Easily triggered anger. Sleep more broken with Ingrezza HS but 8-9 hours. Can be sedated if gets up early with slurred speech but not if full night sleep. Balance better off Equetro. Plan: Started clozapine but needs to increase bc minimal effect and better tolerance, so increase to 50 mg HS  04/05/22 appt noted: Increased clozapine to 50 mg HS.  Some groggy in the AM.  One day was dizzy.  Otherwise on occasion.  Takes it right before bed.   Still depression, hopeless, irritability and anger.  Some periods of racing thoughts.  Sometimes recognizes hypomanic episodes and somethimes doesn't recognize. Dog is very sick and  H with bone CA.  Not crying on clozapine as much. GERD and needs surgery for hiatal hernia. Signed up for water aerobics. Plan: Started clozapine but needs to increase bc minimal effect and better tolerance, so increase to 75 mg HS (Started clozapine on 12/07/21) Reduce librium 5 mg HS and plan to stop  05/10/22 appt med: TD partially better with Ingrezza 40.  Has  a grant.   Increased clozapine 75 mg HS, reduced Librium to 5 mg HS. Tolerated OK. Depression a little better.  Irritability still high.  Poor memory and easily confused. Sleep is pretty good and is better and needs to sleep longer.   Plan: DC librium Worsening TD partial response on 40 mg Ingrezza, increase to 60 mg daily   Continue clozapine 100 mg HS  05/17/2022 phone call complaining of sleeping more and feeling sleepy and foggy  thinking also dropping some things and drooling.  She was instructed to reduce the clozapine to 75 mg nightly to see if that was the problem. 05/23/2022 phone call asking to increase Ingrezza from 60 to 80 mg daily.  It was agreed. 06/16/2022 phone call stating she had a bad manic episode the week prior and is still feeling excessively sedated.  Also having hand tremors. Instructed to stop lithium and continue clozapine 75 mg nightly.  She is very med sensitive but we have few options left to treat her unstable bipolar disorder. 06/21/2022 phone call: After complaining of excessive sleepiness and excessive sleeping she is now complaining of insomnia.  07/07/22 appt noted: Current psychiatric medications include clozapine 75 mg nightly, Focalin 15 mg twice daily, lamotrigine 100 mg twice daily Ingrezza 80 mg daily.  She stopped lithium Has a list of concerns: SE drooling bad.   Still have mouth movements with the increase Ingrezza 80 mg daily but has stopped the tongue chewing. Goes to bed 9 PM and to sleep in 30 mins and awaken in the AM about 830 and hard to wake up.  Not napping in the day.  Getting enough sleep.   Notices Focalin kicking in when takes it. Mood depressed but not as bad.  Still some irritability, anger.  3 week ago bad manic anger lasting 3-4 days.  Not sure how her sleep was at the time. Some crying and poor impulse control.   PCP wanted 2nd opinion from neuro on ? PD, Dr. Arbutus Leas Nov 15. Plan: No med changes pending neurologic appointment  07/20/2022 neurology appointment Dr. Lurena Joiner Tat.  Diagnosed TD.  Assessment as follows: 1.  Tardive dyskinesia -The patients symptoms are most consistent with tardive dyskinesia.  She has had exposure to typical and atypical antipsychotic medication.  TD is a heterogeneous syndrome depending on a subtle balance between several neurotransmitters in the brain, including DA receptor blockade and hypersensitivity of DA and GABA receptors. -pt on  ingrezza since 02/2022 and both she and notes from Dr. Jennelle Human indicate great benefit.  2.  Tremor             -Largely resolved off of lithium and vraylar (vraylar d/c in 12/2021)             -She has very minor left hand tremor.  Did tell her that Ingrezza can occasionally cause parkinsonism, but I did not see a significant degree of that today.             -I did reassure her today that I saw no evidence of idiopathic Parkinson's disease.  She was reassured.  3.  Bipolar d/o             -difficult to control per records             -sees Dr. Jennelle Human frequently  4.  Discussed with patient that she really does not need neurologic follow-up at this point in time.  She was happy to hear this.  08/08/2022 appointment noted: No med changes. Still having a lot of anxiety and worries too much. Worse than depression.  No mania since here.  No sig avoidance.   Hard time concentration. Still having irritability and anger. SE drowsy with clozapine in AM and hard to function until about 10 AM.  Takes it about 8 PM and then goes to bed.   No falling but is shuffling more since here.   Sleep 10 hours.    09/07/22 appt noted:   Consistently on clozapine 75 mg HS.  Too drowsy if takes 25 mg in AM. Doing so so .  A lot of anxiety, anger, irritation.   Avg 8-9 hours of sleep and pushes herself to get up .  Hard to function in am until 11 or 12 noon. Ingreza 40 mg BID still some mouth movements and biting tongue.  Is better with Ingrezza but not gone.   SE consitpation and drowsy.   She is aware of the difficulty finding balance between aenough med to manage her sx and not so much to cause intolerable SE.  Disc dosing of her meds. Plan: Augment clozapine with fluvoxamine 25 mg HS  10/11/22 appt noted: : Current psych meds: Clozapine 75 mg nightly, Focalin 15 mg twice daily, fluvoxamine 25 mg nightly, lamotrigine 100 mg twice daily, Ingrezza 40 mg twice daily No noticeable change with fluvoxamine. Drooling worse  in am and stupor until about 11 AM.   Mood still not good , angry and irritable a lot.  H acuses he rof going off her meds.  Less mouth movements and biting tonue. Sleep 9-10 hours. Anxiety still through the roof. Tremor better right now unless weak.   H prostate CA with bone mets and on pain meds. Plan: Augment clozapine with fluvoxamine but increase 50mg  HS also to help anxiety, irritability  11/09/2022 appointment noted: Added fluvoxamine 50 mg HS.  And is less anxious and more grounded.  Half as irritable as before fluvoxamine.   Down side is shuffling steps seem worse.  Not dizzy usually but balance isn't good.  Gets better after the day progresses.    Overall does feel improved.   Trembling better and mouth movements much better but drooling is bad nothing seems to help that. Drools in public is embarrassing. Tired a lot better as day progresses.   Plan: Augment clozapine with fluvoxamine but REDUCE TO 37.5 mg mg HS bc more shuffling of feet .  And reduced clozapine to 50 mg daily.  But did help anxiety, irritability Ingrezza for TD helped stop tongue chewing but still some mouth movements at 80 mg, which was started 05/23/22.  Shuffling a little more and can't reduce Ingrezza.  Split ingrezza 40 BID  12/12/22 appt noted: Made changes as above.  Asks about increasing Focalin 20 BID bc lack of energy and motivation and productivity.  No SE.   No jittery,  HA. Usally sleep Is good but not always. Still shuffling if gets up at night or before morning Focalin.  Then it clears up.  No change in shuffling since here last visit.  Other day used  H's walker.  Like losing balance.   Mouth movements still there but not nearly as bad.  Worse if forgets a dose of Ingrezza.   Mood depressed and more anxious than last visit.  Constantly obsessive thinking about things that could go wrong.  More short tempered.  Impaitent at home only. H got bad report from onc that current chemo not workingand it is  changed with limited success rate.  This affects her mood too.   No change in sleep. Plan: Plan: First 2 weeks reduce Ingrezza to 60 mg at night to see if shuffling is better with samples. If shuffling is better call office for change in RX If so then increase clozapine back to 3 of the 25 mg capsules and fluvoxamine back to 50 mg .  12/19/22 TC with nurse: Lambert Keto, LPN   TS   1/61/09 11:16 AM Note Pt was in to see her therapist, Rockne Menghini today and asked to speak with nurse. She reports having apt with Dr. Jennelle Human last week on April 8th, and he reduced her Ingrezza to 60 mg, she feels that the issue isn't coming from that but the Fluvoxamine that she was put on a few months back. She reports she may not have explained herself well enough at the apt. She reports when she wakes up during the night she has to shuffle and hold on to the wall to keep from falling. She reports her husband has cancer and she needs to be more alert and able to function in case of emergency with him.  She reports nothing changed with the decrease in that medication.    Informed her I would discuss with Dr. Jennelle Human and get back with her.     12/19/22 MD resp:  Rip Harbour.  Reduce fluvoxamine from 1 and 1/2 tablets at night to 1/2 tablet twice daily.  Split dose to reduce SE      01/09/23 appt noted: Meds: clozapine 50 mg HS, reduced fluvoxamine 12.5 mg Am, forcalin 15 BID, Ingrezza 40 BID, lamotrigine 100 BID,  Not doing well.  Dep and high anxiety.  Some irritability.  Mood swings not dramatic.  No SI. Balance issues when up in middle of night.  Does better once takes morning meds with focalin.   Sometimes forgets afternoon Focalin. Mouth movements still there but better.  Drooling stopped. A lot of things seem like too much working.  Still some wiggling toes and may chew side of mouth , not severe. Plan: Plan: DC fluvoxamine Trial Viibryd 5 mg daily for 1 week then 10 mg daily.  02/06/23 appt noted: Meds:   viibryd 10 HS, clozapine 50 mg HS, off  fluvoxamine,  focalin 15 BID, Ingrezza 40 BID, lamotrigine 100 BID,  Walking better at night wihout fluvoxamine and less dep but mood swings and anxiety through the roof.   Seems higher than last time.  Worry about everything.   Reduced appetite.  Some am nausea.   Upset with H's drinking.  He has terminal CA, prostate stage 4 mets to bones.  Hared living with someone terminal and also alcoholic.  Living with a lot of stress.   Less falling.   Mouth movements seem worse.  No drooling.   Past Psychiatric Medication Trials: Vraylar 4.5 SE mouth movements reduced to 3 mg 3/20. It was effective at lower doses for depression.  Worse off it.  Vraylar 1.5 mg every third day led to relapse of significant depression. Caplyta SE at 42 mg .  Cost problems Latuda 80, , olanzapine, Seroquel, risperidone, Abilify, loxapine 25 mg BID (max tolerated) NR, Clozapine 50  Ingrezza 40 BID  lithium 150 tremor   Trileptal 450, Depakote, Equetro 300 hx balance issues, CBZ ER falling,   Lamictal 200 twice daily,  Focalin,  Ritalin,  fluoxetine 60,  sertraline 100, Wellbutrin history of facial tics, paroxetine cognitive side effects, Lexapro 10 worse Fluvoxamine 50  buspirone,   ropinirole, amantadine, Sinemet, Artane, Cogentin,  pramipexole 0.5 mg BID helped tremor but caused anger trazodone hangover, Ambien hangover,    Review of Systems:  Review of Systems  HENT:  Positive for dental problem and tinnitus.        Chirping cricket sounds in hears since January 2023 drooling  Respiratory:  Positive for cough.   Cardiovascular:  Negative for chest pain.  Gastrointestinal:  Negative for abdominal pain and nausea.       GERD awakening her  Musculoskeletal:  Positive for arthralgias and gait problem.  Neurological:  Positive for dizziness and tremors. Negative for weakness.       Ankle problems and balance problems. No falls lately. Occ stumbles. Tremor is  better in hands Mouth movements  Psychiatric/Behavioral:  Positive for dysphoric mood. Negative for agitation, behavioral problems, confusion, decreased concentration, hallucinations, self-injury, sleep disturbance and suicidal ideas. The patient is nervous/anxious. The patient is not hyperactive.   No falls since here. Not currently depressed but unable to remove this from the list.  Medications: I have reviewed the patient's current medications.  Current Outpatient Medications  Medication Sig Dispense Refill   acetaminophen (TYLENOL) 650 MG CR tablet Take 1,300 mg by mouth as needed for pain.     albuterol (VENTOLIN HFA) 108 (90 Base) MCG/ACT inhaler Inhale 2 puffs into the lungs every 4 (four) hours as needed for wheezing or shortness of breath. 18 g 0   atorvastatin (LIPITOR) 20 MG tablet Take 1 tablet (20 mg total) by mouth every evening. 90 tablet 3   dexmethylphenidate (FOCALIN) 10 MG tablet Take 1.5 tablets (15 mg total) by mouth 2 (two) times daily. 90 tablet 0   Ferrous Gluconate (IRON 27 PO) Take by mouth.     ketoconazole (NIZORAL) 2 % cream Apply 1 Application topically daily. 60 g 0   lamoTRIgine (LAMICTAL) 100 MG tablet Take 1 tablet (100 mg total) by mouth 2 (two) times daily. 60 tablet 0   Melatonin 10 MG CAPS Take by mouth at bedtime as needed.     pantoprazole (PROTONIX) 40 MG tablet Take 1 tablet (40 mg total) by mouth 2 (two) times daily. 180 tablet 3   promethazine-dextromethorphan (PROMETHAZINE-DM) 6.25-15 MG/5ML syrup Take 5 mLs by mouth 4 (four) times daily as needed. 100 mL 0   cloZAPine (CLOZARIL) 25 MG tablet 3 tablets at night 90 tablet 3   valbenazine (INGREZZA) 60 MG capsule Take 1 capsule (60 mg total) by mouth daily. 60 capsule 1   Vilazodone HCl (VIIBRYD) 10 MG TABS 1/2 tablet daily for a week then 1 daily 90 tablet 0   No current facility-administered medications for this visit.    Medication Side Effects: Other: tremor and weight gain.   Dyskinesia  appears better  SE bettter than they were.  Balance problems intermittently  Allergies:  Allergies  Allergen Reactions   Azithromycin Anaphylaxis   Penicillins Anaphylaxis    DID THE REACTION INVOLVE: Swelling of the face/tongue/throat, SOB, or low BP? Yes Sudden or severe rash/hives, skin peeling, or the inside of the mouth  or nose? Yes Did it require medical treatment? No When did it last happen?       If all above answers are "NO", may proceed with cephalosporin use.  Patient reacts to Z pack.  HAS Taken amoxicillin fine.   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    Esophageal stricture    Fracture of superior pubic ramus (HCC) 11/28/2018   GERD (gastroesophageal reflux disease)    Headache(784.0)    Hyperlipidemia    Interstitial cystitis    Joint pain    Lactose intolerance    Lung cancer (HCC) 2002   Neuromuscular disorder (HCC)    Obesity    Osteoarthritis    Palpitations    Sleep apnea    Doesn't use a CPAP   Suicidal ideation 01/20/2020   Swallowing difficulty    Tardive dyskinesia     Family History  Problem Relation Age of Onset   Arthritis Mother    Hearing loss Mother    Hyperlipidemia Mother    Hypertension Mother    Depression Mother    Anxiety disorder Mother    Obesity Mother    Sudden death Mother    Hypertension Father    Diabetes Mellitus II Father    Heart disease Father    Arthritis Father    Cancer Father        Brain   COPD Father    Diabetes Father    Hyperlipidemia Father    Sleep apnea Father    Early death Sister         Aneroxia/Bulimic   Depression Brother    Early death Hydrographic surveyor Accident   Stroke Maternal Grandmother    Hypertension Maternal Grandmother    Arthritis Maternal Grandfather    Heart attack Maternal Grandfather    Hearing loss Maternal Grandfather    Depression Daughter    Drug abuse Daughter    Heart disease Daughter    Hypertension Daughter    Colon cancer Neg Hx    Esophageal cancer Neg Hx    Rectal cancer Neg Hx     Social History   Socioeconomic History   Marital status: Married    Spouse name: Not on file   Number of children: 1   Years of education: 12   Highest education level: Not on file  Occupational History   Occupation: retired  Tobacco Use   Smoking status: Never   Smokeless tobacco: Never  Vaping Use   Vaping Use: Never used  Substance and Sexual Activity   Alcohol use: Yes    Alcohol/week: 1.0 standard drink of alcohol    Types: 1 Glasses of wine per week    Comment: 1 glass wine q few weeks   Drug use: No   Sexual activity: Yes  Other Topics Concern   Not on file  Social History Narrative   Pt  lives in [place] with husband [name].  Followed by Dr. Jennelle Human for psychiatry and Rockne Menghini for therapy.   Right handed   Drinks caffeine   One story home   Married lives with husband   retired   International aid/development worker of Corporate investment banker Strain: Low Risk  (05/11/2022)   Overall Financial Resource Strain (CARDIA)    Difficulty of Paying Living Expenses: Not hard at all  Food Insecurity: No Food Insecurity (05/11/2022)   Hunger Vital Sign    Worried About Running Out of Food in the Last Year: Never true    Ran Out of Food in the Last Year: Never true  Transportation Needs: No Transportation Needs (05/11/2022)   PRAPARE - Administrator, Civil Service (Medical): No    Lack of Transportation (Non-Medical): No  Physical Activity: Inactive (05/11/2022)   Exercise Vital Sign    Days of Exercise per Week: 0 days    Minutes of  Exercise per Session: 0 min  Stress: No Stress Concern Present (05/09/2021)   [employer] of Occupational Health - Occupational Stress Questionnaire    Feeling of Stress : Only a little  Social Connections: Moderately Integrated (05/11/2022)   Social Connection and Isolation Panel [NHANES]    Frequency of Communication with Friends and Family: More than three times a week    Frequency of Social Gatherings with Friends and Family: More than three times a week    Attends Religious Services: More than 4 times per year    Active Member of [employer] or Organizations: No    Attends Banker Meetings: Never    Marital Status: Married  Catering manager Violence: Not At Risk (05/09/2021)   Humiliation, Afraid, Rape, and Kick questionnaire    Fear of Current or Ex-Partner: No    Emotionally Abused: No    Physically Abused: No    Sexually Abused: No    Past Medical History, Surgical history, Social history, and Family history were reviewed and updated as appropriate.   Please see review of systems for further details on the patient's review from today.   Objective:   Physical Exam:  There were no vitals taken for this visit.  Physical Exam Constitutional:      General: She is not in acute distress. Musculoskeletal:        General: No deformity.  Neurological:     Mental Status: She is alert and oriented to person, place, and time.     Cranial Nerves: No dysarthria.     Motor: Tremor present.     Coordination: Coordination normal.     Comments: Mild resting R hand rotational tremor better minimal. Fidgety  better not gone in feet Some buccolingual  is moderately worse Gait is normal  Psychiatric:        Attention and Perception: Attention and perception normal. She does not perceive auditory hallucinations.        Mood and Affect: Mood is anxious and depressed. Affect is not labile, blunt or angry.        Speech: Speech normal. Speech is not rapid and pressured.         Behavior: Behavior normal. Behavior is not agitated or slowed. Behavior is cooperative.        Thought Content: Thought content normal. Thought content is not paranoid or delusional. Thought content does not include homicidal or suicidal ideation. Thought content does not include suicidal plan.        Cognition and Memory:  Cognition and memory normal.        Judgment: Judgment normal.     Comments: Insight intact more mood cycling and still anxious. Without much change since last visit. Mildly fidgety with feet. Much less anxious with fluvoxamine Overall mood needs more improvement but depression better but still highly anxious with a lot of stress.      Lab Review:     Component Value Date/Time   NA 141 09/27/2022 0950   K 4.0 09/27/2022 0950   CL 103 09/27/2022 0950   CO2 22 09/27/2022 0950   GLUCOSE 111 (H) 09/27/2022 0950   GLUCOSE 112 (H) 05/25/2022 1319   BUN 18 09/27/2022 0950   CREATININE 0.96 09/27/2022 0950   CALCIUM 9.6 09/27/2022 0950   PROT 7.0 09/27/2022 0950   ALBUMIN 4.5 09/27/2022 0950   AST 25 09/27/2022 0950   ALT 15 09/27/2022 0950   ALKPHOS 72 09/27/2022 0950   BILITOT 0.4 09/27/2022 0950   GFRNONAA >60 05/25/2022 1319   GFRAA 96 03/02/2020 1433       Component Value Date/Time   WBC 7.1 10/26/2022 1051   WBC 7.4 01/19/2020 1158   RBC 4.81 10/26/2022 1051   RBC 4.19 01/19/2020 1158   HGB 13.7 10/26/2022 1051   HCT 43.0 10/26/2022 1051   PLT 195 10/26/2022 1051   MCV 89 10/26/2022 1051   MCH 28.5 10/26/2022 1051   MCH 30.3 01/19/2020 1158   MCHC 31.9 10/26/2022 1051   MCHC 32.1 01/19/2020 1158   RDW 13.1 10/26/2022 1051   LYMPHSABS 2.2 10/26/2022 1051   MONOABS 0.7 04/04/2019 1004   EOSABS 0.2 10/26/2022 1051   BASOSABS 0.0 10/26/2022 1051  Vitamin D level 33 on 10K units daily on 12/4/`9 Increased to prescription vitamin d 50K units Monday, Wed, Friday.  Rx sent in.   Lithium Lvl  Date Value Ref Range Status  10/21/2018 0.18 (L) 0.60 -  1.20 mmol/L Final    Comment:    Performed at [facility], [address], [place], [place] [address]     No results found for: "PHENYTOIN", "PHENOBARB", "VALPROATE", "CBMZ"   .res Assessment: Plan:    Bipolar disorder with moderate depression (HCC) - Plan: Vilazodone HCl (VIIBRYD) 10 MG TABS  Generalized anxiety disorder - Plan: Vilazodone HCl (VIIBRYD) 10 MG TABS  Tardive dyskinesia - Plan: valbenazine (INGREZZA) 60 MG capsule  Attention deficit hyperactivity disorder (ADHD), predominantly inattentive type  Insomnia due to mental condition  Mild cognitive impairment  Low vitamin D level  Long term current use of clozapine  Tremor of both hands  Bipolar I disorder with rapid cycling (HCC) - Plan: cloZAPine (CLOZARIL) 25 MG tablet  Greater than 50% of 30 min face to face time with patient was spent on counseling and coordination of care. We discussed multiple dxes and concerns.   Evelisse has chronic rapid cycling bipolar disorder which is chronically unstable and has been difficult to control.  Failed 14 different mood stabilizers.  The rapid cycling is making it difficult to control frequency of depressive episodes and the anxiety as well.  We have typically had to make frequent med changes.   Disc gradual increase bc med sensitivity.  Med sensitivity to SE seems to be the biggest problem preventing better mood control  (Started clozapine on 12/07/21).   Having SE drooling and mild sedation.  Eventually sedation likely to get better. Check CBC with diff every 4 weeks.  now increase Clozapine 62.5 mg HS for a week for  mood swings and anxity and if fails but no SE then increase to 75 mg HS.  Ingrezza for TD helped stop tongue chewing but still some mouth movements at 80 mg, which was started 05/23/22.  Shuffling and drooling resolved  Split ingrezza but increase bc TD worse lately to 60 mg BID Consider [place].  Disc ECT. Only FDA approved option left. She wants to defer.     Extensive discussion of clozapine dosing recommendations but we will increase more slowly bc she is so med sensitive.Disc risk low WBC, cardiomyopathy, etc, sedation Disc change to  biweekly labs etc and REMS program.    Prone to UTI and may be causing confusion discussed.  Had confirmed UTI recently.  She has a high residual anxiety.  It has been impossible to control all of her symptoms simultaneously without causing side effects. Failed various meds.  Discussed side effects of each medicine. Continue   Focalin IR 15 mg BID DT Cost and off label for depression  Try to spread this out if possible for mood.  She feels this is necessary  .  Consider increase per her request at next visit.  Lamotrigine 100 mg twice daily to try to reduce polypharmacy and so improve tolerability of clozapine.  consider reduction if clozapine helps.  Failed all reasonable alternatives for anxiety.  Option viibryd, Auvelity  Discussed potential metabolic side effects associated with atypical antipsychotics, as well as potential risk for movement side effects. Advised pt to contact office if movement side effects occur.    Checked B12 folate bc memory complaints.  Normal B12 & folate on 05/25/22  Disc SE meds and this is heightened by the complication of necessary polypharmacy.  Supportive  and cognitive behavioral therapy in terms of dealing with husband's addiction and now new dx metastatic prostate CA..  breaking down tasks into manageable parts.  Grief.   Rec exercise as it would help.    Conintinue but later consider increase.  Viibryd 10 mg daily.  Requires frequent FU DT chronic instability.  Wants to schedule monthly.  FU 4 weeks.   Meredith Staggers MD, DFAPA  Please see After Visit Summary for patient specific instructions.    Future Appointments  Date Time Provider Department Center  02/09/2023 10:30 AM Cottle, Steva Ready., MD [facility] None  02/21/2023 10:00 AM Mathis Fare, LCSW [facility] None   03/13/2023 10:00 AM Mathis Fare, LCSW [facility] None  03/14/2023 10:30 AM Cottle, Steva Ready., MD [facility] None  03/27/2023  1:00 PM Anabel Halon, MD [facility] R[facility]  04/03/2023 10:00 AM Mathis Fare, LCSW [facility] None  04/10/2023 11:30 AM Cottle, Steva Ready., MD [facility] None  04/24/2023 10:00 AM Mathis Fare, LCSW [facility] None  05/09/2023 10:30 AM Cottle, Steva Ready., MD [facility] None    No orders of the defined types were placed in this encounter.      -------------------------------

## 2023-02-09 ENCOUNTER — Ambulatory Visit: Payer: No Typology Code available for payment source | Admitting: Psychiatry

## 2023-02-15 ENCOUNTER — Telehealth: Payer: Self-pay | Admitting: Psychiatry

## 2023-02-15 NOTE — Telephone Encounter (Signed)
CMM has sent PA Request for [facility] 40mg  , See Key [id number]

## 2023-02-16 ENCOUNTER — Telehealth: Payer: Self-pay

## 2023-02-16 NOTE — Telephone Encounter (Signed)
Tried to submit PA but said no coverage found will have to check her insurance information to verify

## 2023-02-17 ENCOUNTER — Other Ambulatory Visit: Payer: Self-pay | Admitting: Psychiatry

## 2023-02-17 ENCOUNTER — Other Ambulatory Visit: Payer: Self-pay

## 2023-02-17 DIAGNOSIS — Z79899 Other long term (current) drug therapy: Secondary | ICD-10-CM

## 2023-02-17 NOTE — Telephone Encounter (Signed)
Pt contacted nurse with concerns on her Ingrezza. [facility] contacts patient every 2 weeks to check on her and if she is having any side effects. She voiced to them that her drooling and mouth movements had started again and she wondered if the increase in Clozapine was causing that. Dr. Jennelle Human had instructed her to take Ingrezza 40 mg every AM, lunch and bedtime. She does keep forgetting the lunch time dose. She was given 60 mg Ingrezza to start once she finished the 40 mg tablets. She is concerned about her side effects. She is anxious about mentioning this and doesn't want to upset you or challenge your instructions.   Informed her I would discuss with Dr. Jennelle Human and give her a call back.

## 2023-02-17 NOTE — Telephone Encounter (Signed)
The drooling is a SE of clozapine.  She could use atropine drops, and it would stop that.  Clozapine is not known to cause TD or any other movement disorder.  Maybe the Ingrezza does need to be increased to 60 BID to make it easier for her to take a higher dose.  Bottom line clozapine is one to the last options we have and it's important for her to continue  it.

## 2023-02-19 ENCOUNTER — Other Ambulatory Visit: Payer: Self-pay

## 2023-02-19 DIAGNOSIS — G2401 Drug induced subacute dyskinesia: Secondary | ICD-10-CM

## 2023-02-19 MED ORDER — VALBENAZINE TOSYLATE 60 MG PO CAPS
60.0000 mg | ORAL_CAPSULE | Freq: Two times a day (BID) | ORAL | 1 refills | Status: DC
Start: 2023-02-19 — End: 2023-04-12

## 2023-02-19 NOTE — Telephone Encounter (Signed)
RX for Ingrezza 60 mg bid to [facility]. Pt notified.

## 2023-02-21 ENCOUNTER — Ambulatory Visit: Payer: No Typology Code available for payment source | Admitting: Psychiatry

## 2023-02-21 DIAGNOSIS — F3132 Bipolar disorder, current episode depressed, moderate: Secondary | ICD-10-CM

## 2023-02-21 NOTE — Progress Notes (Signed)
[facility] Counselor/Therapist Progress Note  Patient ID: Megan Whitney, MRN: [id number],    Date: 02/21/2023  Time Spent: 55 minutes   Treatment Type: Individual Therapy  Reported Symptoms: anxiety heightened, depression "not quite as bad", difficulty making decisions at times, no SI  Mental Status Exam:  Appearance:   Casual     Behavior:  Appropriate, Sharing, and Motivated  Motor:  Normal  Speech/Language:   Clear and Coherent  Affect:  Anxious, some depression  Mood:  anxious and depressed  Thought process:  goal directed  Thought content:    Rumination  Sensory/Perceptual disturbances:    WNL  Orientation:  oriented to person, place, time/date, situation, day of week, month of year, year, and stated date of February 21, 2023  Attention:  Fair  Concentration:  Fair  Memory:  Some short term issues and reports her Dr is aware  Fund of knowledge:   Good and Fair  Insight:    Fair  Judgment:   Good and Fair  Impulse Control:  Fair   Risk Assessment: Danger to Self:  No Self-injurious Behavior: No Danger to Others: No Duty to Warn:no Physical Aggression / Violence:No  Access to Firearms a concern: No  Gang Involvement:No   Subjective:   Patient in today reporting anxiety and some depression as she confronts stressors of husband's terminal illness, guilt, financial stressor, husband's alcoholism. Frustrated, fearful, angry about his continued drinking. "Feels trapped" in current situation. Medical bills a stressor. Conflicts with husband occasionally. 70th birthday party planned. Easily overwhelmed with all "the stressors involved with husband's medical concerns and his drinkng but he refuses to stop. Focused heavily on her own self-care. No tearfulness today.  To continue work on her anticipatory grief, trying to have more positive self-care and positive self-talk as worked on in session today.  Interventions: Cognitive Behavioral Therapy and Ego-Supportive  Long  Term Goal: Reduce overall level, frequency, and intensity of the anxiety so that daily functioning is not impaired. Short Term Goal: 1.Increase understanding of the beliefs and messages that produce the worry and anxiety. Strategies: 1.Help client develop reality-based positive cognitive messages/self-talk. 2. Develop a "coping card" or other reminder which coping strategies are recorded for patient's later use  Diagnosis:   ICD-10-CM   1. Bipolar disorder with moderate depression (HCC)  F31.32      Plan:  Patient in today reporting some increased anxiety regarding stressors of husband's terminal illness and some of the decisions he makes that are counterproductive.  Patient able to create some distance at times from her stress, and at other times gets very absorbed by it.  Is continuing to work with goal-directed behaviors as she works towards specific objectives including more intentional positive self-care. Encouraged patient in her practice of more positive and self affirming behaviors as noted in session including: Focus on making good decisions versus impulsive decisions, believe more in herself and her ability to manage challenging circumstances, refrain from self sabotaging in her goals, staying in contact with supportive family and friends, use of journaling between sessions, taking occasional breaks from her electronics, setting limits with others who are not supportive, refrain from assuming worst-case scenarios, find the strengths and positives within herself, spend time with her 2 dogs which is very therapeutic for her, positive self talk, and recognize the strength she shows when working with goal-directed behaviors to move in a direction that supports her improved emotional health and outlook.  Was calmer by end of session as  she did well and talking through a lot of her concerns and anxieties.   Self-Rating Scale: 1-10 Depression Scale- 5/6  1-10 Anxiety Scale- 7/8   Goal review  and progress/challenges noted with patient.  Next appointment within 2 to 3 weeks.   Mathis Fare, LCSW

## 2023-02-22 NOTE — Telephone Encounter (Signed)
A PA was submitted in case needed one but when I did it says pt has access. [facility] isn't open today but will be open Thursday, June 20th at 8 am.

## 2023-02-23 NOTE — Telephone Encounter (Signed)
Contacted [facility] and they ran her Rx for Ingrezza 60 mg bid, it went through no problem. They do need to order some to complete her fill but it will be in by tomorrow, 06/21.  Pt is notified.

## 2023-02-27 DIAGNOSIS — Z79899 Other long term (current) drug therapy: Secondary | ICD-10-CM | POA: Diagnosis not present

## 2023-02-27 DIAGNOSIS — F319 Bipolar disorder, unspecified: Secondary | ICD-10-CM | POA: Diagnosis not present

## 2023-03-13 ENCOUNTER — Ambulatory Visit (INDEPENDENT_AMBULATORY_CARE_PROVIDER_SITE_OTHER): Payer: No Typology Code available for payment source | Admitting: Psychiatry

## 2023-03-13 DIAGNOSIS — F3132 Bipolar disorder, current episode depressed, moderate: Secondary | ICD-10-CM

## 2023-03-13 NOTE — Progress Notes (Signed)
[facility] Counselor/Therapist Progress Note  Patient ID: ALYZEA RADON, MRN: [id number],    Date: 03/13/2023  Time Spent: 48 minutes   Treatment Type: Individual Therapy  Virtual Visit via Telehealth Note: MyChart Video session Connected with patient by a telemedicine/telehealth application, with their informed consent, and verified patient privacy and that I am speaking with the correct person using two identifiers. I discussed the limitations, risks, security and privacy concerns of performing psychotherapy and the availability of in person appointments. I also discussed with the patient that there may be a patient responsible charge related to this service. The patient expressed understanding and agreed to proceed. I discussed the treatment planning with the patient. The patient was provided an opportunity to ask questions and all were answered. The patient agreed with the plan and demonstrated an understanding of the instructions. The patient was advised to call  our office if  symptoms worsen or feel they are in a crisis state and need immediate contact.   Therapist Location: office Patient Location: home  Reported Symptoms: anxiety, obsessive thoughts, some depression, difficulty making decisions, anger at husband for continued alcohol abuse  Mental Status Exam:  Appearance:   Casual     Behavior:  Appropriate, Sharing, and Motivated  Motor:  normal  Speech/Language:   Normal Rate  Affect:  Depressed and anxious  Mood:  angry, anxious, and depressed  Thought process:  goal directed  Thought content:    Obsessive thoughts  Sensory/Perceptual disturbances:    WNL  Orientation:  oriented to person, place, time/date, situation, day of week, month of year, and year  Attention:  Good  Concentration:  Good  Memory:  Some short term memory issues and Dr is aware  Fund of knowledge:   Good  Insight:    Good and Fair  Judgment:   Good  Impulse Control:  Good   Risk  Assessment: Danger to Self:  No Self-injurious Behavior: No Danger to Others: No Duty to Warn:no Physical Aggression / Violence:No  Access to Firearms a concern: No  Gang Involvement:No   Subjective:  Patient today in mychart video session and reporting symptoms of anxiety and some depression as she continues to cope with the stress and emotional exhaustion of her husband's terminal illness, along with some issues of guilt, financial stressors, and her husband's alcoholism that continues.  Finds herself dealing with both fearfulness and anger about his continued drinking.  Continuing to feel trapped in this situation but is considering reaching out more to extended family and we discussed today how she might and what might be helpful for her in the and knowing how to best help.  Continue to focus on her own self-care and staying in contact with people outside the home.  Tearful at times in session but also showing some strength.  Emphasized positive self talk and self-care, out reach to others, and continued grief work.  Interventions: Cognitive Behavioral Therapy, Solution-Oriented/Positive Psychology, and Ego-Supportive  Long Term Goal: Reduce overall level, frequency, and intensity of the anxiety so that daily functioning is not impaired. Short Term Goal: 1.Increase understanding of the beliefs and messages that produce the worry and anxiety. Strategies: 1.Help client develop reality-based positive cognitive messages/self-talk. 2. Develop a "coping card" or other reminder which coping strategies are recorded for patient's later use  Diagnosis:   ICD-10-CM   1. Bipolar disorder with moderate depression (HCC)  F31.32      Plan:  Patient today actively participating in session as  she continues to work on the grief regarding her husband's terminal illness and also her anger and frustration with the fact he continues to drink and go go against what he has been advised by medical personnel.   Patient is showing increased strength, and more willingness to reach out to others for support.  She is making progress in some ways and needs to continue working with goal-directed behaviors in order to keep moving in a forward direction. Encouraged patient in her practice of more positive and self-affirming behaviors as noted in session including: Increased focus on her own self-care, focus on making good decisions versus impulsive decisions, believing more in herself and her ability to manage challenging circumstances, refrain from self sabotaging in her goals, stay in contact with supportive family and friends, use of journaling between sessions, taking occasional breaks from her electronics, setting limits with others who are not supportive, refrain from assuming worst-case scenarios, find the strengths and positives within herself, spend time with her 2 dogs which is very therapeutic for her, encouraging self talk, and realize the strength she shows when working with goal-directed behaviors to move in a direction that supports her improved emotional health and outlook.  Goal review and progress/challenges noted with patient.  Next appt within 2-3 weeks.   Mathis Fare, LCSW

## 2023-03-14 ENCOUNTER — Encounter: Payer: Self-pay | Admitting: Psychiatry

## 2023-03-14 ENCOUNTER — Telehealth: Payer: No Typology Code available for payment source | Admitting: Psychiatry

## 2023-03-14 DIAGNOSIS — F3132 Bipolar disorder, current episode depressed, moderate: Secondary | ICD-10-CM

## 2023-03-14 DIAGNOSIS — F411 Generalized anxiety disorder: Secondary | ICD-10-CM | POA: Diagnosis not present

## 2023-03-14 DIAGNOSIS — F5105 Insomnia due to other mental disorder: Secondary | ICD-10-CM | POA: Diagnosis not present

## 2023-03-14 DIAGNOSIS — G3184 Mild cognitive impairment, so stated: Secondary | ICD-10-CM

## 2023-03-14 DIAGNOSIS — F900 Attention-deficit hyperactivity disorder, predominantly inattentive type: Secondary | ICD-10-CM | POA: Diagnosis not present

## 2023-03-14 DIAGNOSIS — R7989 Other specified abnormal findings of blood chemistry: Secondary | ICD-10-CM

## 2023-03-14 DIAGNOSIS — Z79899 Other long term (current) drug therapy: Secondary | ICD-10-CM | POA: Diagnosis not present

## 2023-03-14 DIAGNOSIS — G2401 Drug induced subacute dyskinesia: Secondary | ICD-10-CM | POA: Diagnosis not present

## 2023-03-14 NOTE — Progress Notes (Signed)
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 therefore instructed to stop Vraylar. Taken clozapine 25 mg once last night. Experiencing more depression.  Anxiety out the roof.  Anger.  Sleep is good and better with clozapine.9-10 hours. Rough 3 weeks.  Mixed sx with depression the main one. SE drooling. Mouth movements, she doesn't want to add another med right now. Tremor a lot better off Vraylar, almost none.   Saw neuro and pending sleep study. Plan: Clonidine off label for irritability and anxiety 0.05 mg BID Increase clonidine to 1/2 tablet twice a day for a week.    12/16/2021 phone call from patient's husband concerned that she is grinding her teeth and slurring her words.  She had started clozapine taking 25 mg tablets 1-1/2 nightly and she was instructed to reduce the dose to 25 mg nightly  01/04/2022 appointment with the following noted: Off Vraylar and on clozapine 25 mg HS.  Continues Equetro 200, CBZ 100, Lamotrigine 200 BID, lithium 150 HS, clonidine 0.1 mg 1/2 in AM and 1 at night, Librium 10 HS. SE a alittle dizzy. SE: Still having mouth movements and biting tongue.  Sometimes hard  to talk.  Drooling.  When tries to increase clozapine slurred speech and severe dizziness.   Mood is a little better.   Sleeping 8-9 hours.  So much better sleep with clozapine.   Still has anxiety, generalized. Plan: To minimize polypharmacy and improve tolerabilty:  Reduce Equetro to 1 of the 100 mg capsule at night for 1 week, then stop it. Wait 1 week then stop the carbamazepine chewable. Wait 1 more week then reduce clonidine to 1/2 at night for 1 week then stop it. Plan: Started clozapine  and continue 25 mg for now bc hasn't tolerated more so far.  02/08/22 appt noted: Mouth movements and biting tongue.  Sores on cheek with constant chewing movements. Sometimes hard to talk. Hypersalivation gets worse as day progresses. Sometimes balance problems.  Constipation managed.   Sleep very well.  8-9 hours. Off Equetro and on clozapine. Still has depression and anxiety without much change Plan: Started clozapine  and continue 25 mg for now bc hasn't tolerated more and need to start Ingrezza 40 mg for TD.  03/23/2022 appointment with the following noted: Several phone calls since here.  Has gotten up to  clozapine 37.5 mg nightly. Continues Focalin 15 mg twice daily, lamotrigine 200 mg twice daily, lithium 150 nightly. Started Ingrezza 40 mg daily. Ingrezza amazing difference but even with grant of $10000 can't afford it.  Not biting mouth and mouth less sore.  Less mouth movements but not gone Emotionally not real well with anxiety and depression and crying spells.  Also some irritability and anger.  Easily triggered anger. Sleep more broken with Ingrezza HS but 8-9 hours. Can be sedated if gets up early with slurred speech but not if full night sleep. Balance better off Equetro. Plan: Started clozapine but needs to increase bc minimal effect and better tolerance, so increase to 50 mg HS  04/05/22 appt noted: Increased clozapine to 50 mg HS.  Some groggy in the AM.  One day was dizzy.   Otherwise on occasion.  Takes it right before bed.   Still depression, hopeless, irritability and anger.  Some periods of racing thoughts.  Sometimes recognizes hypomanic episodes and somethimes doesn't recognize. Dog is very sick and H with bone CA.  Not crying on clozapine as much. GERD and needs surgery for hiatal hernia. Signed up for water aerobics. Plan: Started clozapine but needs to increase bc minimal effect and better tolerance, so increase to 75 mg HS (Started clozapine on 12/07/21) Reduce librium 5 mg HS and plan to stop  05/10/22 appt med: TD partially better with Ingrezza 40.  Has  a grant.   Increased clozapine 75 mg HS, reduced Librium to 5 mg HS. Tolerated OK. Depression a little better.  Irritability still high.  Poor memory and easily confused. Sleep is pretty good and is better and needs to sleep longer.   Plan: DC librium Worsening TD partial response on 40 mg Ingrezza, increase to 60 mg daily   Continue clozapine 100 mg HS  05/17/2022 phone call complaining of sleeping more and feeling sleepy and foggy thinking also dropping some things and drooling.  She was instructed to reduce the clozapine to 75 mg nightly to see if that was the problem. 05/23/2022 phone call asking to increase Ingrezza from 60 to 80 mg daily.  It was agreed. 06/16/2022 phone call stating she had a bad manic episode the week prior and is still feeling excessively sedated.  Also having hand tremors. Instructed to stop lithium and continue clozapine 75 mg nightly.  She is very med sensitive but we have few options left to treat her unstable bipolar disorder. 06/21/2022 phone call: After complaining of excessive sleepiness and excessive sleeping she is now complaining of insomnia.  07/07/22 appt noted: Current psychiatric medications include clozapine 75 mg nightly, Focalin 15 mg twice daily, lamotrigine 100 mg twice daily Ingrezza 80 mg daily.  She stopped lithium Has a list of concerns: SE drooling bad.    Still have mouth movements with the increase Ingrezza 80 mg daily but has stopped the tongue chewing. Goes to bed 9 PM and to sleep in 30 mins and awaken in the AM about 830 and hard to wake up.  Not napping in the day.  Getting enough sleep.   Notices Focalin kicking in when takes it. Mood depressed but not as bad.  Still some irritability, anger.  3 week ago bad manic anger lasting 3-4 days.  Not sure how her sleep was at the time. Some crying and poor impulse control.   PCP wanted 2nd opinion from neuro on ? PD, Dr. Arbutus Leas Nov 15. Plan: No med changes pending neurologic appointment  07/20/2022 neurology appointment Dr. Lurena Joiner Tat.  Diagnosed TD.  Assessment as follows: 1.  Tardive dyskinesia -The patients symptoms are most consistent with tardive dyskinesia.  She has had exposure to typical and atypical antipsychotic medication.  TD is a heterogeneous syndrome depending on a subtle balance between several neurotransmitters in the brain, including DA receptor blockade and hypersensitivity of DA and GABA receptors. -pt on ingrezza since 02/2022 and both she and notes from Dr. Jennelle Human indicate great benefit.  2.  Tremor             -Largely resolved off of lithium and vraylar (vraylar d/c in 12/2021)             -She has very minor left hand tremor.  Did tell her that Ingrezza can occasionally cause parkinsonism, but I did not see a significant degree of that today.             -I did reassure her today that I saw no evidence of idiopathic Parkinson's disease.  She was reassured.  3.  Bipolar d/o             -difficult to control per records             -sees Dr. Jennelle Human frequently  4.  Discussed with patient that she really does not need neurologic follow-up at this point in time.  She was happy to hear this.  08/08/2022 appointment noted: No med changes. Still having a lot of anxiety and worries too much. Worse than depression.  No mania since here.  No sig avoidance.   Hard time  concentration. Still having irritability and anger. SE drowsy with clozapine in AM and hard to function until about 10 AM.  Takes it about 8 PM and then goes to bed.   No falling but is shuffling more since here.   Sleep 10 hours.    09/07/22 appt noted:   Consistently on clozapine 75 mg HS.  Too drowsy if takes 25 mg in AM. Doing so so .  A lot of anxiety, anger, irritation.   Avg 8-9 hours of sleep and pushes herself to get up .  Hard to function in am until 11 or 12 noon. Ingreza 40 mg BID still some mouth movements and biting tongue.  Is better with Ingrezza but not gone.   SE consitpation and drowsy.   She is aware of the difficulty finding balance between aenough med to manage her sx and not so much to cause intolerable SE.  Disc dosing of her meds. Plan: Augment clozapine with fluvoxamine 25 mg HS  10/11/22 appt noted: : Current psych meds: Clozapine 75 mg nightly, Focalin 15 mg twice daily, fluvoxamine 25 mg nightly, lamotrigine 100 mg twice daily, Ingrezza 40 mg twice daily No noticeable change with fluvoxamine. Drooling worse in am and stupor until about 11 AM.   Mood still not good , angry and irritable a lot.  H acuses he rof going off her meds.  Less mouth movements and biting tonue. Sleep 9-10 hours. Anxiety still through the roof. Tremor better right now unless weak.   H prostate CA with bone mets and on pain meds. Plan: Augment clozapine with fluvoxamine but increase 50mg  HS also to help anxiety, irritability  11/09/2022 appointment noted: Added fluvoxamine 50 mg HS.  And is less anxious and more grounded.  Half as irritable as before fluvoxamine.   Down side is shuffling steps seem worse.  Not dizzy usually but balance isn't good.  Gets better after the day progresses.    Overall does feel improved.   Trembling better and mouth movements much better but drooling is bad nothing seems to help that. Drools in public is embarrassing. Tired a lot better as day progresses.    Plan: Augment clozapine with fluvoxamine but REDUCE TO 37.5 mg mg HS bc more shuffling of feet .  And reduced clozapine to 50 mg daily.  But did help anxiety, irritability Ingrezza for TD helped stop tongue chewing but still some mouth movements at 80 mg, which was started 05/23/22.  Shuffling a little more and can't reduce Ingrezza.  Split ingrezza 40 BID  12/12/22 appt noted: Made changes as above.  Asks about increasing Focalin 20 BID bc lack of energy and motivation and productivity.  No SE.   No jittery,  HA. Usally sleep Is good but not always. Still shuffling if gets up at night or before morning Focalin.  Then it clears up.  No change in shuffling since here last visit.  Other day used H's walker.  Like losing balance.   Mouth movements still there but not nearly as bad.  Worse if forgets a dose of Ingrezza.   Mood depressed and more anxious than last visit.  Constantly obsessive thinking about things that could go wrong.  More short tempered.  Impaitent at home only. H got bad report from onc that current chemo not workingand it is changed with limited success rate.  This affects her mood too.   No change in sleep. Plan: Plan: First 2 weeks reduce Ingrezza to 60 mg at night to see if shuffling is better with samples. If shuffling is better call office for change in RX If so then increase clozapine back to 3 of the 25 mg capsules and fluvoxamine back to 50 mg .  12/19/22 TC with nurse: Lambert Keto, LPN   TS   7/84/69 11:16 AM Note Pt was in to see her therapist, Rockne Menghini today and asked to speak with nurse. She reports having apt with Dr. Jennelle Human last week on April 8th, and he reduced her Ingrezza to 60 mg, she feels that the issue isn't coming from that but the Fluvoxamine that she was put on a few months back. She reports she may not have explained herself well enough at the apt. She reports when she wakes up during the night she has to shuffle and hold on to the wall to keep  from falling. She reports her husband has cancer and she needs to be more alert and able to function in case of emergency with him.  She reports nothing changed with the decrease in that medication.    Informed her I would discuss with Dr. Jennelle Human and get back with her.     12/19/22 MD resp:  Rip Harbour.  Reduce fluvoxamine from 1 and 1/2 tablets at night to 1/2 tablet twice daily.  Split dose to reduce SE      01/09/23 appt noted: Meds: clozapine 50 mg HS, reduced fluvoxamine 12.5 mg Am, forcalin 15 BID, Ingrezza 40 BID, lamotrigine 100 BID,  Not doing well.  Dep and high anxiety.  Some irritability.  Mood swings not dramatic.  No SI. Balance issues when up in middle of night.  Does better once takes morning meds with focalin.   Sometimes forgets afternoon Focalin. Mouth movements still there but better.  Drooling stopped. A lot of things seem like too much working.  Still some wiggling toes and may  chew side of mouth , not severe. Plan: Plan: DC fluvoxamine Trial Viibryd 5 mg daily for 1 week then 10 mg daily.  02/06/23 appt noted: Meds:  viibryd 10 HS, clozapine 50 mg HS, off  fluvoxamine,  focalin 15 BID, Ingrezza 40 BID, lamotrigine 100 BID,  Walking better at night wihout fluvoxamine and less dep but mood swings and anxiety through the roof.   Seems higher than last time.  Worry about everything.   Reduced appetite.  Some am nausea.   Upset with H's drinking.  He has terminal CA, prostate stage 4 mets to bones.  Hared living with someone terminal and also alcoholic.  Living with a lot of stress.   Less falling.   Mouth movements seem worse.  No drooling. Plan: increase Clozapine 62.5 mg HS for a week for mood swings and anxity and if fails but no SE then increase to 75 mg HS. Split ingrezza but increase bc TD worse lately to 60 mg BID  03/14/23 appt Noted: Increased clozapine to 75 mg HS. No SE except sedating at night.   Anxiety is still high but dep seems better.  Worry over H's CA and hosp  last weekend.  Lots of medical bills.  General worry mostly about H's CA and alcoholism. Couple mood swings and racing obsessive thinking briefly. Sleeping well.  No SI Loss of appetite.    Past Psychiatric Medication Trials: Vraylar 4.5 SE mouth movements reduced to 3 mg 3/20. It was effective at lower doses for depression.  Worse off it.  Vraylar 1.5 mg every third day led to relapse of significant depression. [place]ta SE at 42 mg .  Cost problems Latuda 80, , olanzapine, Seroquel, risperidone, Abilify, loxapine 25 mg BID (max tolerated) NR, Clozapine 50  Ingrezza 40 BID  lithium 150 tremor   Trileptal 450, Depakote, Equetro 300 hx balance issues, CBZ ER falling,   Lamictal 200 twice daily,  Focalin,  Ritalin,  fluoxetine 60,  sertraline 100, Wellbutrin history of facial tics, paroxetine cognitive side effects, Lexapro 10 worse Fluvoxamine 50  buspirone,   ropinirole, amantadine, Sinemet, Artane, Cogentin,  pramipexole 0.5 mg BID helped tremor but caused anger trazodone hangover, Ambien hangover,    Review of Systems:  Review of Systems  HENT:  Positive for dental problem and tinnitus.        Chirping cricket sounds in hears since January 2023 drooling  Respiratory:  Positive for cough.   Cardiovascular:  Negative for chest pain.  Gastrointestinal:  Negative for abdominal pain and nausea.       GERD awakening her  Musculoskeletal:  Positive for arthralgias and gait problem.  Neurological:  Positive for dizziness and tremors. Negative for weakness.       Ankle problems and balance problems. No falls lately. Occ stumbles. Tremor is better in hands Mouth movements  Psychiatric/Behavioral:  Negative for agitation, behavioral problems, confusion, decreased concentration, dysphoric mood, hallucinations, self-injury, sleep disturbance and suicidal ideas. The patient is nervous/anxious. The patient is not hyperactive.   No falls since here. Not currently depressed but unable to  remove this from the list.  Medications: I have reviewed the patient's current medications.  Current Outpatient Medications  Medication Sig Dispense Refill   acetaminophen (TYLENOL) 650 MG CR tablet Take 1,300 mg by mouth as needed for pain.     albuterol (VENTOLIN HFA) 108 (90 Base) MCG/ACT inhaler Inhale 2 puffs into the lungs every 4 (four) hours as needed for wheezing or shortness of breath. 18  g 0   atorvastatin (LIPITOR) 20 MG tablet Take 1 tablet (20 mg total) by mouth every evening. 90 tablet 3   cloZAPine (CLOZARIL) 25 MG tablet 3 tablets at night 90 tablet 3   dexmethylphenidate (FOCALIN) 10 MG tablet Take 1.5 tablets (15 mg total) by mouth 2 (two) times daily. 90 tablet 0   Ferrous Gluconate (IRON 27 PO) Take by mouth.     ketoconazole (NIZORAL) 2 % cream Apply 1 Application topically daily. 60 g 0   lamoTRIgine (LAMICTAL) 100 MG tablet Take 1 tablet (100 mg total) by mouth 2 (two) times daily. 60 tablet 0   Melatonin 10 MG CAPS Take by mouth at bedtime as needed.     pantoprazole (PROTONIX) 40 MG tablet Take 1 tablet (40 mg total) by mouth 2 (two) times daily. 180 tablet 3   promethazine-dextromethorphan (PROMETHAZINE-DM) 6.25-15 MG/5ML syrup Take 5 mLs by mouth 4 (four) times daily as needed. 100 mL 0   valbenazine (INGREZZA) 60 MG capsule Take 1 capsule (60 mg total) by mouth 2 (two) times daily. 60 capsule 1   Vilazodone HCl (VIIBRYD) 10 MG TABS 1/2 tablet daily for a week then 1 daily 90 tablet 0   No current facility-administered medications for this visit.    Medication Side Effects: Other: tremor and weight gain.   Dyskinesia appears better  SE bettter than they were.  Balance problems intermittently  Allergies:  Allergies  Allergen Reactions   Azithromycin Anaphylaxis   Penicillins Anaphylaxis    DID THE REACTION INVOLVE: Swelling of the face/tongue/throat, SOB, or low BP? Yes Sudden or severe rash/hives, skin peeling, or the inside of the mouth or nose? Yes Did it  require medical treatment? No When did it last happen?       If all above answers are "NO", may proceed with cephalosporin use.  Patient reacts to Z pack.  HAS Taken amoxicillin fine.   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    Esophageal stricture    Fracture of superior pubic ramus (HCC) 11/28/2018   GERD (gastroesophageal reflux disease)    Headache(784.0)    Hyperlipidemia    Interstitial cystitis    Joint pain    Lactose intolerance    Lung cancer (HCC) 2002   Neuromuscular disorder (HCC)    Obesity    Osteoarthritis    Palpitations    Sleep apnea    Doesn't use a CPAP   Suicidal ideation 01/20/2020   Swallowing difficulty    Tardive dyskinesia     Family History  Problem Relation Age of Onset   Arthritis Mother    Hearing loss Mother    Hyperlipidemia Mother    Hypertension Mother    Depression Mother    Anxiety disorder Mother    Obesity Mother    Sudden death Mother    Hypertension Father    Diabetes Mellitus II Father  Heart disease Father    Arthritis Father    Cancer Father        Brain   COPD Father    Diabetes Father    Hyperlipidemia Father    Sleep apnea Father    Early death Sister        Aneroxia/Bulimic   Depression Brother    Early death Hydrographic surveyor Accident   Stroke Maternal Grandmother    Hypertension Maternal Grandmother    Arthritis Maternal Grandfather    Heart attack Maternal Grandfather    Hearing loss Maternal Grandfather    Depression Daughter    Drug abuse Daughter    Heart disease Daughter     Hypertension Daughter    Colon cancer Neg Hx    Esophageal cancer Neg Hx    Rectal cancer Neg Hx     Social History   Socioeconomic History   Marital status: Married    Spouse name: Not on file   Number of children: 1   Years of education: 12   Highest education level: Not on file  Occupational History   Occupation: retired  Tobacco Use   Smoking status: Never   Smokeless tobacco: Never  Vaping Use   Vaping Use: Never used  Substance and Sexual Activity   Alcohol use: Yes    Alcohol/week: 1.0 standard drink of alcohol    Types: 1 Glasses of wine per week    Comment: 1 glass wine q few weeks   Drug use: No   Sexual activity: Yes  Other Topics Concern   Not on file  Social History Narrative   Pt lives in [place] with husband [name].  Followed by Dr. Jennelle Human for psychiatry and Rockne Menghini for therapy.   Right handed   Drinks caffeine   One story home   Married lives with husband   retired   International aid/development worker of Corporate investment banker Strain: Low Risk  (05/11/2022)   Overall Financial Resource Strain (CARDIA)    Difficulty of Paying Living Expenses: Not hard at all  Food Insecurity: No Food Insecurity (05/11/2022)   Hunger Vital Sign    Worried About Running Out of Food in the Last Year: Never true    Ran Out of Food in the Last Year: Never true  Transportation Needs: No Transportation Needs (05/11/2022)   PRAPARE - Administrator, Civil Service (Medical): No    Lack of Transportation (Non-Medical): No  Physical Activity: Inactive (05/11/2022)   Exercise Vital Sign    Days of Exercise per Week: 0 days    Minutes of Exercise per Session: 0 min  Stress: No Stress Concern Present (05/09/2021)   [employer] of Occupational Health - Occupational Stress Questionnaire    Feeling of Stress : Only a little  Social Connections: Moderately Integrated (05/11/2022)   Social Connection and Isolation Panel [NHANES]    Frequency of Communication with Friends and  Family: More than three times a week    Frequency of Social Gatherings with Friends and Family: More than three times a week    Attends Religious Services: More than 4 times per year    Active Member of [employer] or Organizations: No    Attends Banker Meetings: Never    Marital Status: Married  Catering manager Violence: Not At Risk (05/09/2021)   Humiliation, Afraid, Rape, and Kick questionnaire    Fear of Current or Ex-Partner: No    Emotionally  Abused: No    Physically Abused: No    Sexually Abused: No    Past Medical History, Surgical history, Social history, and Family history were reviewed and updated as appropriate.   Please see review of systems for further details on the patient's review from today.   Objective:   Physical Exam:  There were no vitals taken for this visit.  Physical Exam Neurological:     Mental Status: She is alert and oriented to person, place, and time.     Cranial Nerves: No dysarthria.     Comments: Lip licking consistent  Psychiatric:        Attention and Perception: Attention and perception normal.        Mood and Affect: Mood is anxious. Mood is not depressed.        Speech: Speech normal.        Behavior: Behavior is cooperative.        Thought Content: Thought content normal. Thought content is not paranoid or delusional. Thought content does not include homicidal or suicidal ideation. Thought content does not include suicidal plan.        Cognition and Memory: Cognition and memory normal.        Judgment: Judgment normal.     Comments: Insight intact     Lab Review:     Component Value Date/Time   NA 141 09/27/2022 0950   K 4.0 09/27/2022 0950   CL 103 09/27/2022 0950   CO2 22 09/27/2022 0950   GLUCOSE 111 (H) 09/27/2022 0950   GLUCOSE 112 (H) 05/25/2022 1319   BUN 18 09/27/2022 0950   CREATININE 0.96 09/27/2022 0950   CALCIUM 9.6 09/27/2022 0950   PROT 7.0 09/27/2022 0950   ALBUMIN 4.5 09/27/2022 0950   AST 25  09/27/2022 0950   ALT 15 09/27/2022 0950   ALKPHOS 72 09/27/2022 0950   BILITOT 0.4 09/27/2022 0950   GFRNONAA >60 05/25/2022 1319   GFRAA 96 03/02/2020 1433       Component Value Date/Time   WBC 7.1 10/26/2022 1051   WBC 7.4 01/19/2020 1158   RBC 4.81 10/26/2022 1051   RBC 4.19 01/19/2020 1158   HGB 13.7 10/26/2022 1051   HCT 43.0 10/26/2022 1051   PLT 195 10/26/2022 1051   MCV 89 10/26/2022 1051   MCH 28.5 10/26/2022 1051   MCH 30.3 01/19/2020 1158   MCHC 31.9 10/26/2022 1051   MCHC 32.1 01/19/2020 1158   RDW 13.1 10/26/2022 1051   LYMPHSABS 2.2 10/26/2022 1051   MONOABS 0.7 04/04/2019 1004   EOSABS 0.2 10/26/2022 1051   BASOSABS 0.0 10/26/2022 1051  Vitamin D level 33 on 10K units daily on 12/4/`9 Increased to prescription vitamin d 50K units Monday, Wed, Friday.  Rx sent in.   Lithium Lvl  Date Value Ref Range Status  10/21/2018 0.18 (L) 0.60 - 1.20 mmol/L Final    Comment:    Performed at [facility], [address] [place], [place] [address]     No results found for: "PHENYTOIN", "PHENOBARB", "VALPROATE", "CBMZ"   .res Assessment: Plan:    Bipolar disorder with moderate depression (HCC)  Generalized anxiety disorder  Attention deficit hyperactivity disorder (ADHD), predominantly inattentive type  Insomnia due to mental condition  Long term current use of clozapine  Tardive dyskinesia  Mild cognitive impairment  Low vitamin D level  Greater than 50% of 30 min face to face time with patient was spent on counseling and coordination of care. We discussed multiple  dxes and concerns.   Addelina has chronic rapid cycling bipolar disorder which is chronically unstable and has been difficult to control.  Failed 14 different mood stabilizers.  The rapid cycling is making it difficult to control frequency of depressive episodes and the anxiety as well.  We have typically had to make frequent med changes.   Disc gradual increase bc med sensitivity.  Med sensitivity  to SE seems to be the biggest problem preventing better mood control  (Started clozapine on 12/07/21).   Having SE drooling and mild sedation.  Eventually sedation likely to get better. Check CBC with diff every 4 weeks.  now increase Clozapine 62.5 mg HS for a week for mood swings and anxity and if fails but no SE then increase to 75 mg HS.  Ingrezza for TD helped stop tongue chewing but still some mouth movements at 80 mg, which was started 05/23/22.  Shuffling and drooling resolved  Split ingrezza but increase bc TD worse lately to 60 mg BID Consider Austedo.  Disc ECT. Only FDA approved option left. She wants to defer.    Extensive discussion of clozapine dosing recommendations but we will increase more slowly bc she is so med sensitive.Disc risk low WBC, cardiomyopathy, etc, sedation Disc change to  biweekly labs etc and REMS program.    Prone to UTI and may be causing confusion discussed.  Had confirmed UTI recently.  She has a high residual anxiety.  It has been impossible to control all of her symptoms simultaneously without causing side effects. Failed various meds.  Discussed side effects of each medicine. Continue   Focalin IR 15 mg BID DT Cost and off label for depression  Try to spread this out if possible for mood.  She feels this is necessary  .  Consider increase per her request at next visit. Would like to avoid BZ if possible.  Lamotrigine 100 mg twice daily to try to reduce polypharmacy and so improve tolerability of clozapine.  consider reduction if clozapine helps.  Failed all reasonable alternatives for anxiety.  Option viibryd, Auvelity  Discussed potential metabolic side effects associated with atypical antipsychotics, as well as potential risk for movement side effects. Advised pt to contact office if movement side effects occur.    Checked B12 folate bc memory complaints.  Normal B12 & folate on 05/25/22  Disc SE meds and this is heightened by the complication  of necessary polypharmacy.  Supportive  and cognitive behavioral therapy in terms of dealing with husband's addiction and now new dx metastatic prostate CA..  breaking down tasks into manageable parts.  Grief.   Rec exercise as it would help.  Take advantage of family support  Increase to  Viibryd 15 mg daily for anxiety.  Take with food.  "As much as I can remember"  and usually with dinner.  Requires frequent FU DT chronic instability.  Wants to schedule monthly.  FU 4 weeks.   Meredith Staggers MD, DFAPA  Please see After Visit Summary for patient specific instructions.    Future Appointments  Date Time Provider Department Center  03/27/2023  1:00 PM Anabel Halon, MD [facility]  03/28/2023 11:00 AM Mathis Fare, LCSW [facility] None  04/03/2023 10:00 AM Mathis Fare, LCSW [facility] None  04/10/2023 11:30 AM Cottle, Steva Ready., MD [facility] None  04/24/2023 10:00 AM Mathis Fare, LCSW [facility] None  05/09/2023 10:30 AM Cottle, Steva Ready., MD [facility] None  05/10/2023 10:00 AM Mathis Fare, LCSW [facility] None  05/29/2023 10:00 AM Mathis Fare, LCSW [facility] None  06/08/2023 10:30 AM Cottle, Steva Ready., MD [facility] None    No orders of the defined types were placed in this encounter.      -------------------------------

## 2023-03-20 ENCOUNTER — Other Ambulatory Visit: Payer: Self-pay | Admitting: Internal Medicine

## 2023-03-20 ENCOUNTER — Encounter: Payer: Self-pay | Admitting: Internal Medicine

## 2023-03-20 DIAGNOSIS — E559 Vitamin D deficiency, unspecified: Secondary | ICD-10-CM

## 2023-03-20 DIAGNOSIS — E782 Mixed hyperlipidemia: Secondary | ICD-10-CM

## 2023-03-20 DIAGNOSIS — R7303 Prediabetes: Secondary | ICD-10-CM

## 2023-03-20 DIAGNOSIS — G20C Parkinsonism, unspecified: Secondary | ICD-10-CM

## 2023-03-20 DIAGNOSIS — K219 Gastro-esophageal reflux disease without esophagitis: Secondary | ICD-10-CM

## 2023-03-20 DIAGNOSIS — F319 Bipolar disorder, unspecified: Secondary | ICD-10-CM

## 2023-03-20 DIAGNOSIS — G4730 Sleep apnea, unspecified: Secondary | ICD-10-CM

## 2023-03-22 DIAGNOSIS — R7303 Prediabetes: Secondary | ICD-10-CM | POA: Diagnosis not present

## 2023-03-22 DIAGNOSIS — F319 Bipolar disorder, unspecified: Secondary | ICD-10-CM | POA: Diagnosis not present

## 2023-03-22 DIAGNOSIS — E559 Vitamin D deficiency, unspecified: Secondary | ICD-10-CM | POA: Diagnosis not present

## 2023-03-22 DIAGNOSIS — G20C Parkinsonism, unspecified: Secondary | ICD-10-CM | POA: Diagnosis not present

## 2023-03-22 DIAGNOSIS — E782 Mixed hyperlipidemia: Secondary | ICD-10-CM | POA: Diagnosis not present

## 2023-03-22 DIAGNOSIS — G4730 Sleep apnea, unspecified: Secondary | ICD-10-CM | POA: Diagnosis not present

## 2023-03-23 LAB — CBC WITH DIFFERENTIAL/PLATELET
Basophils Absolute: 0 10*3/uL (ref 0.0–0.2)
Basos: 0 %
EOS (ABSOLUTE): 0.2 10*3/uL (ref 0.0–0.4)
Eos: 2 %
Hematocrit: 47.3 % — ABNORMAL HIGH (ref 34.0–46.6)
Hemoglobin: 14.8 g/dL (ref 11.1–15.9)
Immature Grans (Abs): 0 10*3/uL (ref 0.0–0.1)
Immature Granulocytes: 1 %
Lymphocytes Absolute: 3.4 10*3/uL — ABNORMAL HIGH (ref 0.7–3.1)
Lymphs: 39 %
MCH: 28.8 pg (ref 26.6–33.0)
MCHC: 31.3 g/dL — ABNORMAL LOW (ref 31.5–35.7)
MCV: 92 fL (ref 79–97)
Monocytes Absolute: 0.8 10*3/uL (ref 0.1–0.9)
Monocytes: 9 %
Neutrophils Absolute: 4.3 10*3/uL (ref 1.4–7.0)
Neutrophils: 49 %
Platelets: 225 10*3/uL (ref 150–450)
RBC: 5.14 x10E6/uL (ref 3.77–5.28)
RDW: 13.8 % (ref 11.7–15.4)
WBC: 8.8 10*3/uL (ref 3.4–10.8)

## 2023-03-23 LAB — LIPID PANEL
Chol/HDL Ratio: 2.5 ratio (ref 0.0–4.4)
Cholesterol, Total: 184 mg/dL (ref 100–199)
HDL: 74 mg/dL (ref >39)
LDL Chol Calc (NIH): 93 mg/dL (ref 0–99)
Triglycerides: 94 mg/dL (ref 0–149)
VLDL Cholesterol Cal: 17 mg/dL (ref 5–40)

## 2023-03-23 LAB — CMP14+EGFR
ALT: 14 IU/L (ref 0–32)
AST: 27 IU/L (ref 0–40)
Albumin: 4.5 g/dL (ref 3.9–4.9)
Alkaline Phosphatase: 68 IU/L (ref 44–121)
BUN/Creatinine Ratio: 12 (ref 12–28)
BUN: 12 mg/dL (ref 8–27)
Bilirubin Total: 0.7 mg/dL (ref 0.0–1.2)
CO2: 23 mmol/L (ref 20–29)
Calcium: 9.9 mg/dL (ref 8.7–10.3)
Chloride: 103 mmol/L (ref 96–106)
Creatinine, Ser: 1.04 mg/dL — ABNORMAL HIGH (ref 0.57–1.00)
Globulin, Total: 2.7 g/dL (ref 1.5–4.5)
Glucose: 93 mg/dL (ref 70–99)
Potassium: 3.9 mmol/L (ref 3.5–5.2)
Sodium: 142 mmol/L (ref 134–144)
Total Protein: 7.2 g/dL (ref 6.0–8.5)
eGFR: 59 mL/min/{1.73_m2} — ABNORMAL LOW (ref >59)

## 2023-03-23 LAB — VITAMIN D 25 HYDROXY (VIT D DEFICIENCY, FRACTURES): Vit D, 25-Hydroxy: 63.8 ng/mL (ref 30.0–100.0)

## 2023-03-23 LAB — HEMOGLOBIN A1C
Est. average glucose Bld gHb Est-mCnc: 114 mg/dL
Hgb A1c MFr Bld: 5.6 % (ref 4.8–5.6)

## 2023-03-23 LAB — TSH: TSH: 1.4 u[IU]/mL (ref 0.450–4.500)

## 2023-03-27 ENCOUNTER — Encounter: Payer: Self-pay | Admitting: Internal Medicine

## 2023-03-27 ENCOUNTER — Ambulatory Visit: Payer: No Typology Code available for payment source | Admitting: Internal Medicine

## 2023-03-27 VITALS — BP 130/86 | HR 94 | Ht 60.0 in | Wt 155.4 lb

## 2023-03-27 DIAGNOSIS — G2401 Drug induced subacute dyskinesia: Secondary | ICD-10-CM

## 2023-03-27 DIAGNOSIS — Z0001 Encounter for general adult medical examination with abnormal findings: Secondary | ICD-10-CM

## 2023-03-27 DIAGNOSIS — M5136 Other intervertebral disc degeneration, lumbar region: Secondary | ICD-10-CM

## 2023-03-27 DIAGNOSIS — F319 Bipolar disorder, unspecified: Secondary | ICD-10-CM

## 2023-03-27 DIAGNOSIS — E782 Mixed hyperlipidemia: Secondary | ICD-10-CM

## 2023-03-27 DIAGNOSIS — K219 Gastro-esophageal reflux disease without esophagitis: Secondary | ICD-10-CM

## 2023-03-27 NOTE — Progress Notes (Signed)
Established Patient Office Visit  Subjective:  Patient ID: Megan Whitney, female    DOB: 1955/11/12  Age: 67 y.o. MRN: [id number]  CC:  Chief Complaint  Patient presents with   Annual Exam   Back Pain    Patient states as the day goes on her back is in a lot of pain by the end of the night she can not stand for long periods    HPI Megan Whitney is a 67 y.o. female with past medical history of bipolar disorder, ADD, anxiety, OSA, GERD, HLD and osteopenia who presents for annual physical.    Bipolar disorder: She is on multiple medications for bipolar disorder and ADD. She follows up with Psychiatrist and has a therapist, who she visits every week.  GERD and hiatal hernia: She takes Protonix twice daily for it.  She was referred to general surgeon by her GI provider for hiatal hernia. She currently denies any nausea or vomiting, but has chronic acid reflux and epigastric pain.  Denies any melena or hematochezia currently.  She reports chronic, intermittent low back pain.  Pain is sharp, nonradiating and and is worse in the evening.  Denies any recent fall or injury.  Denies any new numbness or tingling of the LE.  Denies any urinary or stool incontinence.  Chart review shows old MRI in 20 2006, which showed disc protrusion at L2-3 level.  Blood tests were reviewed and discussed with the patient in detail.     Past Medical History:  Diagnosis Date   ADD (attention deficit disorder)    Allergy    Seasonal   Anemia    History of GI blood loss   Anxiety    Arthritis    Atrophy of vagina 10/07/2020   Bipolar 1 disorder (HCC)    Cancer (HCC)    Colon polyps    Depression    Diabetes mellitus (HCC)    pt denies   Edema, lower extremity    Epistaxis    Around 2011 or 2012, required cauterization.    Esophageal stricture    Fracture of superior pubic ramus (HCC) 11/28/2018   GERD (gastroesophageal reflux disease)    Headache(784.0)    Hyperlipidemia    Interstitial  cystitis    Joint pain    Lactose intolerance    Lung cancer (HCC) 2002   Neuromuscular disorder (HCC)    Obesity    Osteoarthritis    Palpitations    Sleep apnea    Doesn't use a CPAP   Suicidal ideation 01/20/2020   Swallowing difficulty    Tardive dyskinesia     Past Surgical History:  Procedure Laterality Date   BALLOON DILATION  05/16/2012   Procedure: BALLOON DILATION;  Surgeon: Louis Meckel, MD;  Location: [facility] ENDOSCOPY;  Service: Endoscopy;  Laterality: N/A;   BUNIONECTOMY  2011   COLONOSCOPY     ENTEROSCOPY  05/16/2012   Procedure: ENTEROSCOPY;  Surgeon: Louis Meckel, MD;  Location: [facility] ENDOSCOPY;  Service: Endoscopy;  Laterality: N/A;   JOINT REPLACEMENT     right shoulder durgery 25 yrs ago  1988   TOTAL HIP ARTHROPLASTY Bilateral 2006, 2008   bilateral   TUBAL LIGATION  1990   WEDGE RESECTION  2002   lung cancer    Family History  Problem Relation Age of Onset   Arthritis Mother    Hearing loss Mother    Hyperlipidemia Mother    Hypertension Mother    Depression Mother  Anxiety disorder Mother    Obesity Mother    Sudden death Mother    Hypertension Father    Diabetes Mellitus II Father    Heart disease Father    Arthritis Father    Cancer Father        Brain   COPD Father    Diabetes Father    Hyperlipidemia Father    Sleep apnea Father    Early death Sister        Aneroxia/Bulimic   Depression Brother    Early death Hydrographic surveyor Accident   Stroke Maternal Grandmother    Hypertension Maternal Grandmother    Arthritis Maternal Grandfather    Heart attack Maternal Grandfather    Hearing loss Maternal Grandfather    Depression Daughter    Drug abuse Daughter    Heart disease Daughter    Hypertension Daughter    Colon cancer Neg Hx    Esophageal cancer Neg Hx    Rectal cancer Neg Hx     Social History   Socioeconomic History   Marital status: Married    Spouse name: Not on file   Number of children: 1   Years of  education: 12   Highest education level: Not on file  Occupational History   Occupation: retired  Tobacco Use   Smoking status: Never   Smokeless tobacco: Never  Vaping Use   Vaping status: Never Used  Substance and Sexual Activity   Alcohol use: Yes    Alcohol/week: 1.0 standard drink of alcohol    Types: 1 Glasses of wine per week    Comment: 1 glass wine q few weeks   Drug use: No   Sexual activity: Yes  Other Topics Concern   Not on file  Social History Narrative   Pt lives in [place] with husband Megan Whitney.  Followed by Dr. Jennelle Human for psychiatry and Rockne Menghini for therapy.   Right handed   Drinks caffeine   One story home   Married lives with husband   retired   International aid/development worker of Corporate investment banker Strain: Low Risk  (05/11/2022)   Overall Financial Resource Strain (CARDIA)    Difficulty of Paying Living Expenses: Not hard at all  Food Insecurity: No Food Insecurity (05/11/2022)   Hunger Vital Sign    Worried About Running Out of Food in the Last Year: Never true    Ran Out of Food in the Last Year: Never true  Transportation Needs: No Transportation Needs (05/11/2022)   PRAPARE - Administrator, Civil Service (Medical): No    Lack of Transportation (Non-Medical): No  Physical Activity: Inactive (05/11/2022)   Exercise Vital Sign    Days of Exercise per Week: 0 days    Minutes of Exercise per Session: 0 min  Stress: No Stress Concern Present (05/09/2021)   [employer] of Occupational Health - Occupational Stress Questionnaire    Feeling of Stress : Only a little  Social Connections: Moderately Integrated (05/11/2022)   Social Connection and Isolation Panel [NHANES]    Frequency of Communication with Friends and Family: More than three times a week    Frequency of Social Gatherings with Friends and Family: More than three times a week    Attends Religious Services: More than 4 times per year    Active Member of [employer] or Organizations: No     Attends Banker Meetings: Never    Marital Status: Married  Intimate Partner Violence: Unknown (12/10/2021)   Received from [facility]   HITS    Physically Hurt: Not on file    Insult or Talk Down To: Not on file    Threaten Physical Harm: Not on file    Scream or Curse: Not on file    Outpatient Medications Prior to Visit  Medication Sig Dispense Refill   acetaminophen (TYLENOL) 650 MG CR tablet Take 1,300 mg by mouth as needed for pain.     albuterol (VENTOLIN HFA) 108 (90 Base) MCG/ACT inhaler Inhale 2 puffs into the lungs every 4 (four) hours as needed for wheezing or shortness of breath. 18 g 0   atorvastatin (LIPITOR) 20 MG tablet Take 1 tablet (20 mg total) by mouth every evening. 90 tablet 3   cloZAPine (CLOZARIL) 25 MG tablet 3 tablets at night 90 tablet 3   dexmethylphenidate (FOCALIN) 10 MG tablet Take 1.5 tablets (15 mg total) by mouth 2 (two) times daily. 90 tablet 0   Ferrous Gluconate (IRON 27 PO) Take by mouth.     ketoconazole (NIZORAL) 2 % cream Apply 1 Application topically daily. 60 g 0   lamoTRIgine (LAMICTAL) 100 MG tablet Take 1 tablet (100 mg total) by mouth 2 (two) times daily. 60 tablet 0   Melatonin 10 MG CAPS Take by mouth at bedtime as needed.     pantoprazole (PROTONIX) 40 MG tablet Take 1 tablet (40 mg total) by mouth 2 (two) times daily. 180 tablet 3   promethazine-dextromethorphan (PROMETHAZINE-DM) 6.25-15 MG/5ML syrup Take 5 mLs by mouth 4 (four) times daily as needed. 100 mL 0   valbenazine (INGREZZA) 60 MG capsule Take 1 capsule (60 mg total) by mouth 2 (two) times daily. 60 capsule 1   Vilazodone HCl (VIIBRYD) 10 MG TABS 1/2 tablet daily for a week then 1 daily 90 tablet 0   No facility-administered medications prior to visit.    Allergies  Allergen Reactions   Azithromycin Anaphylaxis   Penicillins Anaphylaxis    DID THE REACTION INVOLVE: Swelling of the face/tongue/throat, SOB, or low BP? Yes Sudden or severe rash/hives, skin  peeling, or the inside of the mouth or nose? Yes Did it require medical treatment? No When did it last happen?       If all above answers are "NO", may proceed with cephalosporin use.  Patient reacts to Z pack.  HAS Taken amoxicillin fine.   Adhesive [Tape] Other (See Comments)    On bandaids    ROS Review of Systems  Constitutional:  Negative for chills and fever.  HENT:  Negative for congestion, sinus pressure, sinus pain and sore throat.   Eyes:  Negative for pain and discharge.  Respiratory:  Negative for cough and shortness of breath.   Cardiovascular:  Negative for palpitations.  Gastrointestinal:  Positive for abdominal pain. Negative for diarrhea, nausea and vomiting.  Endocrine: Negative for polydipsia and polyuria.  Genitourinary:  Negative for dysuria and hematuria.  Musculoskeletal:  Positive for back pain. Negative for neck pain and neck stiffness.  Skin:  Negative for rash.  Neurological:  Negative for dizziness and weakness.  Psychiatric/Behavioral:  Positive for sleep disturbance. Negative for agitation and behavioral problems. The patient is nervous/anxious.       Objective:    Physical Exam Vitals reviewed.  Constitutional:      General: She is not in acute distress.    Appearance: She is obese. She is not diaphoretic.  HENT:     Head: Normocephalic and  atraumatic.     Nose: Nose normal. No nasal tenderness.     Right Sinus: No maxillary sinus tenderness.     Left Sinus: No maxillary sinus tenderness.     Mouth/Throat:     Mouth: Mucous membranes are moist.  Eyes:     General: No scleral icterus.    Extraocular Movements: Extraocular movements intact.  Cardiovascular:     Rate and Rhythm: Normal rate and regular rhythm.     Pulses: Normal pulses.     Heart sounds: Normal heart sounds. No murmur heard. Pulmonary:     Breath sounds: Normal breath sounds. No wheezing or rales.  Abdominal:     Palpations: Abdomen is soft.     Tenderness: There is no  abdominal tenderness.  Musculoskeletal:     Cervical back: Neck supple. No tenderness.     Lumbar back: Tenderness present. Decreased range of motion.     Right lower leg: No edema.     Left lower leg: No edema.  Skin:    General: Skin is warm.     Findings: No rash.  Neurological:     General: No focal deficit present.     Mental Status: She is alert and oriented to person, place, and time.     Sensory: No sensory deficit.     Motor: No weakness.     Gait: Gait abnormal.     Comments: Resting tremor of right hand  Psychiatric:        Behavior: Behavior normal.        Thought Content: Thought content normal.     BP 130/86 (BP Location: Right Arm, Patient Position: Sitting, Cuff Size: Normal)   Pulse 94   Ht 5' (1.524 m)   Wt 155 lb 6.4 oz (70.5 kg)   SpO2 92%   BMI 30.35 kg/m  Wt Readings from Last 3 Encounters:  03/27/23 155 lb 6.4 oz (70.5 kg)  11/28/22 173 lb (78.5 kg)  09/22/22 179 lb 9.6 oz (81.5 kg)    Lab Results  Component Value Date   TSH 1.400 03/22/2023   Lab Results  Component Value Date   WBC 8.8 03/22/2023   HGB 14.8 03/22/2023   HCT 47.3 (H) 03/22/2023   MCV 92 03/22/2023   PLT 225 03/22/2023   Lab Results  Component Value Date   NA 142 03/22/2023   K 3.9 03/22/2023   CO2 23 03/22/2023   GLUCOSE 93 03/22/2023   BUN 12 03/22/2023   CREATININE 1.04 (H) 03/22/2023   BILITOT 0.7 03/22/2023   ALKPHOS 68 03/22/2023   AST 27 03/22/2023   ALT 14 03/22/2023   PROT 7.2 03/22/2023   ALBUMIN 4.5 03/22/2023   CALCIUM 9.9 03/22/2023   ANIONGAP 9 05/25/2022   EGFR 59 (L) 03/22/2023   GFR 69.31 04/04/2019   Lab Results  Component Value Date   CHOL 184 03/22/2023   Lab Results  Component Value Date   HDL 74 03/22/2023   Lab Results  Component Value Date   LDLCALC 93 03/22/2023   Lab Results  Component Value Date   TRIG 94 03/22/2023   Lab Results  Component Value Date   CHOLHDL 2.5 03/22/2023   Lab Results  Component Value Date    HGBA1C 5.6 03/22/2023      Assessment & Plan:   Problem List Items Addressed This Visit       Digestive   GERD (gastroesophageal reflux disease)    Usually well-controlled, on Pantoprazole 40 mg  QD -and advised to avoid twice daily dosing Advised to avoid hot or spicy food Try to be sitting upright at least for 30 minutes after meal        Nervous and Auditory   Neuroleptic-induced tardive dyskinesia    Likely due to antipsychotic Has had neurology evaluation - visit note reviewed, on Ingrezza        Musculoskeletal and Integument   DDD (degenerative disc disease), lumbar    Chronic low back pain Check x-ray of lumbar spine-likely has DDD of lumbar spine Previous MRI lumbar spine reviewed Avoid heavy lifting and frequent bending Continue Tylenol as needed for back pain Heating pad and/or back brace as needed Simple back exercises material provided Referred to PT      Relevant Orders   DG Lumbar Spine Complete   Ambulatory referral to Physical Therapy     Other   Bipolar disorder (HCC)    On Viibryd, Lamictal, Ingrezza, clozapine and Focalin Follows up with Psychiatry [facility] - 30/30      Relevant Orders   CMP14+EGFR   CBC with Differential/Platelet   Hyperlipidemia    On Atorvastatin Lipid profile reviewed      Relevant Orders   CMP14+EGFR   Lipid Profile   Encounter for general adult medical examination with abnormal findings - Primary    Physical exam as documented. Counseling done  re healthy lifestyle involving commitment to 150 minutes exercise per week, heart healthy diet, and attaining healthy weight.The importance of adequate sleep also discussed. Changes in health habits are decided on by the patient with goals and time frames  set for achieving them. Immunization and cancer screening needs are specifically addressed at this visit.        No orders of the defined types were placed in this encounter.   Follow-up: Return in about 6 months  (around 09/27/2023).    Anabel Halon, MD

## 2023-03-27 NOTE — Assessment & Plan Note (Signed)
Usually well-controlled, on Pantoprazole 40 mg QD -and advised to avoid twice daily dosing Advised to avoid hot or spicy food Try to be sitting upright at least for 30 minutes after meal

## 2023-03-27 NOTE — Assessment & Plan Note (Signed)
Likely due to antipsychotic Has had neurology evaluation - visit note reviewed, on Ingrezza

## 2023-03-27 NOTE — Assessment & Plan Note (Signed)
Chronic low back pain Check x-ray of lumbar spine-likely has DDD of lumbar spine Previous MRI lumbar spine reviewed Avoid heavy lifting and frequent bending Continue Tylenol as needed for back pain Heating pad and/or back brace as needed Simple back exercises material provided Referred to PT

## 2023-03-27 NOTE — Patient Instructions (Addendum)
Please take Tylenol as needed for back pain.  Please perform back exercises as shown in material.  Please avoid heavy lifting and frequent bending.  Please continue to take medications as prescribed.  Please continue to follow heart healthy diet and perform moderate exercise/walking at least 150 mins/week.

## 2023-03-27 NOTE — Assessment & Plan Note (Signed)
On Atorvastatin Lipid profile reviewed 

## 2023-03-27 NOTE — Assessment & Plan Note (Signed)
On Viibryd, Lamictal, Ingrezza, clozapine and Focalin Follows up with Psychiatry [facility] - 30/30

## 2023-03-27 NOTE — Assessment & Plan Note (Signed)

## 2023-03-28 ENCOUNTER — Ambulatory Visit: Payer: No Typology Code available for payment source | Admitting: Psychiatry

## 2023-03-28 DIAGNOSIS — F3132 Bipolar disorder, current episode depressed, moderate: Secondary | ICD-10-CM | POA: Diagnosis not present

## 2023-03-28 NOTE — Progress Notes (Signed)
[facility] Counselor/Therapist Progress Note  Patient ID: Megan Whitney, MRN: [id number],    Date: 03/28/2023  Time Spent: 55 minutes   Treatment Type: Individual Therapy  Reported Symptoms: "bipolar and anxiety", irritable, depression, obsessive/racing thoughts, anger at husband re: his alcohol abuse, anticipatory grief re: husband  Mental Status Exam:  Appearance:   Neat     Behavior:  Appropriate, Sharing, and Motivated  Motor:  Normal  Speech/Language:   Clear and Coherent  Affect:  Depressed and anxious, anger at husband  Mood:  anxious, depressed, and irritable  Thought process:  goal directed  Thought content:    WNL  Sensory/Perceptual disturbances:    WNL  Orientation:  oriented to person, place, time/date, situation, day of week, month of year, year, and stated date of July 23,   Attention:  Good  Concentration:  Good and Fair  Memory:  Some short term memory issues and Dr is aware  Fund of knowledge:   Good and Fair  Insight:    Good and Fair  Judgment:   Good  Impulse Control:  Good   Risk Assessment: Danger to Self:  No Self-injurious Behavior: No Danger to Others: No Duty to Warn:no Physical Aggression / Violence:No  Access to Firearms a concern: No  Gang Involvement:No   Subjective:  Patient in session today reporting depression and anxiety as she copes with the mental and emotional stress and exhaustion related to her husband's terminal illness.  Continues to have some issues of guilt, financial stressors, and issues surrounding her husband's alcoholism that continues despite the fact he has been advised not to drink by medical personnel.  Showing definite strength in herself at times and is making good use of some supportive people in her life including a few good friends and extended family from whom she is accepting help more readily these days.  Continues to struggle some with issues of fearfulness and anger, along with some feelings of being  "trapped" in certain situations.  Anticipatory grief issues. Some improvement in her confidence in certain situations. Highlighted positive self-care with patient and her remaining in contact with supportive friends  Interventions: Cognitive Behavioral Therapy and Ego-Supportive  Long Term Goal: Reduce overall level, frequency, and intensity of the anxiety so that daily functioning is not impaired. Short Term Goal: 1.Increase understanding of the beliefs and messages that produce the worry and anxiety. Strategies: 1.Help client develop reality-based positive cognitive messages/self-talk. 2. Develop a "coping card" or other reminder which coping strategies are recorded for patient's later use   Diagnosis:   ICD-10-CM   1. Bipolar disorder with moderate depression (HCC)  F31.32      Plan:  Patient today participating well in session focusing more on her mixed feelings with husband and his terminal illness.  Patient continues to deal with frustration and anger as he does not follow medical advice at times especially with his continued alcohol abuse going against the advice of medical personnel.  Some improvement in her reaching out to others for support.  As shown some increased strength and belief in herself at times but due to circumstances has experienced some ups and downs.  She has made progress and needs to continue working with her goal-directed behaviors that help her move in a forward direction. Encouraged patient to be practicing the positive and self affirming behaviors as discussed in sessions including: Increased focus on her own self-care, focused on making good decisions versus impulsive decisions, believing more in herself and  her ability to manage challenging circumstances, refrain from self sabotage in her goals, stay in contact with supportive family and friends, use of journaling between sessions, taking occasional breaks from her electronics, setting limits with others who are not  supportive, refrain from assuming worst-case scenarios, find the strength and the positives within herself, spending time with her 2 dogs which is very therapeutic for her, use of encouraging self talk, and recognize the strengths she shows when working with goal-directed behaviors to move in a direction that supports her improved emotional health and overall wellbeing.  Goal review and progress/challenges noted with patient.  Next appointment within 2-3 weeks.   Mathis Fare, LCSW

## 2023-04-03 ENCOUNTER — Other Ambulatory Visit: Payer: Self-pay | Admitting: Psychiatry

## 2023-04-03 ENCOUNTER — Ambulatory Visit: Payer: No Typology Code available for payment source | Admitting: Psychiatry

## 2023-04-03 DIAGNOSIS — F411 Generalized anxiety disorder: Secondary | ICD-10-CM

## 2023-04-03 DIAGNOSIS — F314 Bipolar disorder, current episode depressed, severe, without psychotic features: Secondary | ICD-10-CM

## 2023-04-03 NOTE — Progress Notes (Addendum)
[facility] Counselor/Therapist Progress Note  Patient ID: Megan Whitney, MRN: [id number],    Date: 04/03/2023  Time Spent: 50 minutes   Treatment Type: Individual Therapy  Virtual Visit via Telehealth Note: Mychart Video Connected with patient by a telemedicine/telehealth application, with their informed consent, and verified patient privacy and that I am speaking with the correct person using two identifiers. I discussed the limitations, risks, security and privacy concerns of performing psychotherapy and the availability of in person appointments. I also discussed with the patient that there may be a patient responsible charge related to this service. The patient expressed understanding and agreed to proceed. I discussed the treatment planning with the patient. The patient was provided an opportunity to ask questions and all were answered. The patient agreed with the plan and demonstrated an understanding of the instructions. The patient was advised to call  our office if  symptoms worsen or feel they are in a crisis state and need immediate contact.   Therapist Location: office Patient Location: home   Reported Symptoms: anxiety, some depression, obsessive thoughts, anger at husband due to alcohol abuse and "not following advice of medical personnel while in midst of his terminal cancer", anticipatory grief   Mental Status Exam:  Appearance:   Casual     Behavior:  Appropriate and Motivated  Motor:  Normal  Speech/Language:   Clear and Coherent  Affect:  Depressed and anxiety  Mood:  anxious, depressed, and irritable  Thought process:  goal directed  Thought content:    WNL  Sensory/Perceptual disturbances:    WNL  Orientation:  oriented to person, place, time/date, situation, day of week, month of year, year, and stated date of April 03, 2023  Attention:  Fair  Concentration:  Fair  Memory:  Some short term memory issues and Dr is aware  Fund of knowledge:   Good   Insight:    Good and Fair  Judgment:   Fair  Impulse Control:  Fair   Risk Assessment: Danger to Self:  No Self-injurious Behavior: No Danger to Others: No Duty to Warn:no Physical Aggression / Violence:No  Access to Firearms a concern: No  Gang Involvement:No   Subjective:  Patient in session today and reports anxiety, depression, obsessiveness, anger at husband for continuing to drink against medical advice as he has terminal cancer, and some anticipatory grief. States her worst problem "is my anxiety". Is trying to cope with husband's Had birthday party for husband this past weekend and it went well. But, I wasn't concentrating enough on those that did come to the party, but instead, focused on the people that did not attend, which contributed to some anxiety and irritability for patient. Talked about her irritability and anxiety about that event and her thinking patterns which did seem to help, along with sharing some tangible ways patient can work to decrease her tendency in the negative thought patterns. Issues with grand-daughter and relationship feeling guarded, and trying to "not take it personally". Working to decrease her assuming. Is holding steady "with my strength" and that is encouraging for her "especially with everything on my plate to deal with."Some continued work today on her anticipatory grief, encouraging positive self-care that can help reduce her fears and anxieties. Encouraged contact with supportive friends.  Interventions: Cognitive Behavioral Therapy and Ego-Supportive  Long Term Goal: Reduce overall level, frequency, and intensity of the anxiety so that daily functioning is not impaired. Short Term Goal: 1.Increase understanding of the beliefs and  messages that produce the worry and anxiety. Strategies: 1.Help client develop reality-based positive cognitive messages/self-talk. 2. Develop a "coping card" or other reminder which coping strategies are recorded for  patient's later use  Diagnosis:   ICD-10-CM   1. Generalized anxiety disorder  F41.1      Plan:  Patient today in session and working further on her feelings that are very mixed at times regarding her husband and his terminal illness.  Anxiety, obsessiveness, anger all focused on today. She continues to report significant frustration and anger as husband frequently does not follow medical advice at times particularly related to his continued alcohol abuse.  Encouraged her continued reaching out to others for support and she is doing better with this.  Doubts herself at times and also shows increased strength at other times.  She is making progress and needs to continue her work with goal-directed behaviors and remain on her medication in order to keep moving forward. Reminded and encouraged patient in her practice of positive and self affirming behaviors as noted in session including: Improvement in her own self-care, remain focused on making good decisions versus impulsive decisions, believe more in herself and her ability to manage challenging circumstances, refrain from any self sabotage in her goals, remain in contact with supportive family and friends, use of journaling between sessions which has proven to be helpful for her, setting limits with people who are not supportive, refrain from assuming worst-case scenarios, look for the strength and positives within herself, spending time with her 2 dogs which is very therapeutic for her, encouraging self talk, and realize the strength she shows when working with goal-directed behaviors to move in a direction that supports her improved emotional health and outlook.  Goal review and progress/challenges noted with patient.  Next appointment within 2 to 3 weeks.  Mathis Fare, LCSW

## 2023-04-10 ENCOUNTER — Ambulatory Visit: Payer: No Typology Code available for payment source | Admitting: Psychiatry

## 2023-04-10 ENCOUNTER — Encounter: Payer: Self-pay | Admitting: Psychiatry

## 2023-04-10 DIAGNOSIS — F900 Attention-deficit hyperactivity disorder, predominantly inattentive type: Secondary | ICD-10-CM

## 2023-04-10 DIAGNOSIS — F3132 Bipolar disorder, current episode depressed, moderate: Secondary | ICD-10-CM | POA: Diagnosis not present

## 2023-04-10 DIAGNOSIS — G2401 Drug induced subacute dyskinesia: Secondary | ICD-10-CM

## 2023-04-10 DIAGNOSIS — R251 Tremor, unspecified: Secondary | ICD-10-CM

## 2023-04-10 DIAGNOSIS — G3184 Mild cognitive impairment, so stated: Secondary | ICD-10-CM | POA: Diagnosis not present

## 2023-04-10 DIAGNOSIS — R7989 Other specified abnormal findings of blood chemistry: Secondary | ICD-10-CM

## 2023-04-10 DIAGNOSIS — F5105 Insomnia due to other mental disorder: Secondary | ICD-10-CM

## 2023-04-10 DIAGNOSIS — F411 Generalized anxiety disorder: Secondary | ICD-10-CM

## 2023-04-10 DIAGNOSIS — Z79899 Other long term (current) drug therapy: Secondary | ICD-10-CM

## 2023-04-10 DIAGNOSIS — F319 Bipolar disorder, unspecified: Secondary | ICD-10-CM | POA: Diagnosis not present

## 2023-04-10 MED ORDER — VILAZODONE HCL 20 MG PO TABS
20.0000 mg | ORAL_TABLET | Freq: Every day | ORAL | 1 refills | Status: DC
Start: 2023-04-10 — End: 2023-05-09

## 2023-04-10 MED ORDER — CLOZAPINE 100 MG PO TABS
100.0000 mg | ORAL_TABLET | Freq: Every day | ORAL | 1 refills | Status: AC
Start: 2023-04-10 — End: ?

## 2023-04-10 NOTE — Progress Notes (Signed)
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 dizzy. SE: Still having mouth movements and biting tongue.  Sometimes hard to talk.  Drooling.  When tries to increase clozapine slurred speech and severe dizziness.   Mood is a little better.   Sleeping 8-9 hours.  So much better sleep with clozapine.   Still has anxiety, generalized. Plan: To minimize polypharmacy and improve tolerabilty:  Reduce Equetro to 1 of the 100 mg capsule at night for 1 week, then stop it. Wait 1 week then stop the carbamazepine chewable. Wait 1 more week then reduce clonidine to 1/2 at night for 1 week then stop it. Plan: Started clozapine  and continue 25 mg for now bc hasn't tolerated more so far.  02/08/22 appt noted: Mouth movements and biting tongue.  Sores on cheek with constant chewing movements. Sometimes hard to talk. Hypersalivation gets worse as day progresses. Sometimes balance problems.  Constipation managed.   Sleep very well.  8-9 hours. Off Equetro and on clozapine. Still has depression and anxiety without much change Plan: Started clozapine  and continue 25 mg for now bc hasn't tolerated more and need to  start Ingrezza 40 mg for TD.  03/23/2022 appointment with the following noted: Several phone calls since here.  Has gotten up to clozapine 37.5 mg nightly. Continues Focalin 15 mg twice daily, lamotrigine 200 mg twice daily, lithium 150 nightly. Started Ingrezza 40 mg daily. Ingrezza amazing difference but even with grant of $10000 can't afford it.  Not biting mouth and mouth less sore.  Less mouth movements but not gone Emotionally not real well with anxiety and depression and crying spells.  Also some irritability and anger.  Easily triggered anger. Sleep more broken with Ingrezza HS but 8-9 hours. Can be sedated if gets up early with slurred speech but not if full night sleep. Balance better off Equetro. Plan: Started clozapine but needs to increase bc minimal effect and better tolerance, so increase to 50 mg HS  04/05/22 appt noted: Increased clozapine to 50 mg HS.  Some groggy in the AM.  One day was dizzy.  Otherwise on occasion.  Takes it right before bed.   Still depression, hopeless, irritability and anger.  Some periods of racing thoughts.  Sometimes recognizes hypomanic episodes  and somethimes doesn't recognize. Dog is very sick and H with bone CA.  Not crying on clozapine as much. GERD and needs surgery for hiatal hernia. Signed up for water aerobics. Plan: Started clozapine but needs to increase bc minimal effect and better tolerance, so increase to 75 mg HS (Started clozapine on 12/07/21) Reduce librium 5 mg HS and plan to stop  05/10/22 appt med: TD partially better with Ingrezza 40.  Has  a grant.   Increased clozapine 75 mg HS, reduced Librium to 5 mg HS. Tolerated OK. Depression a little better.  Irritability still high.  Poor memory and easily confused. Sleep is pretty good and is better and needs to sleep longer.   Plan: DC librium Worsening TD partial response on 40 mg Ingrezza, increase to 60 mg daily   Continue clozapine 100 mg HS  05/17/2022 phone call complaining of  sleeping more and feeling sleepy and foggy thinking also dropping some things and drooling.  She was instructed to reduce the clozapine to 75 mg nightly to see if that was the problem. 05/23/2022 phone call asking to increase Ingrezza from 60 to 80 mg daily.  It was agreed. 06/16/2022 phone call stating she had a bad manic episode the week prior and is still feeling excessively sedated.  Also having hand tremors. Instructed to stop lithium and continue clozapine 75 mg nightly.  She is very med sensitive but we have few options left to treat her unstable bipolar disorder. 06/21/2022 phone call: After complaining of excessive sleepiness and excessive sleeping she is now complaining of insomnia.  07/07/22 appt noted: Current psychiatric medications include clozapine 75 mg nightly, Focalin 15 mg twice daily, lamotrigine 100 mg twice daily Ingrezza 80 mg daily.  She stopped lithium Has a list of concerns: SE drooling bad.   Still have mouth movements with the increase Ingrezza 80 mg daily but has stopped the tongue chewing. Goes to bed 9 PM and to sleep in 30 mins and awaken in the AM about 830 and hard to wake up.  Not napping in the day.  Getting enough sleep.   Notices Focalin kicking in when takes it. Mood depressed but not as bad.  Still some irritability, anger.  3 week ago bad manic anger lasting 3-4 days.  Not sure how her sleep was at the time. Some crying and poor impulse control.   PCP wanted 2nd opinion from neuro on ? PD, Dr. Arbutus Leas Nov 15. Plan: No med changes pending neurologic appointment  07/20/2022 neurology appointment Dr. Lurena Joiner Tat.  Diagnosed TD.  Assessment as follows: 1.  Tardive dyskinesia -The patients symptoms are most consistent with tardive dyskinesia.  She has had exposure to typical and atypical antipsychotic medication.  TD is a heterogeneous syndrome depending on a subtle balance between several neurotransmitters in the brain, including DA receptor blockade and  hypersensitivity of DA and GABA receptors. -pt on ingrezza since 02/2022 and both she and notes from Dr. Jennelle Human indicate great benefit.  2.  Tremor             -Largely resolved off of lithium and vraylar (vraylar d/c in 12/2021)             -She has very minor left hand tremor.  Did tell her that Ingrezza can occasionally cause parkinsonism, but I did not see a significant degree of that today.             -I did reassure her today that I saw no  evidence of idiopathic Parkinson's disease.  She was reassured.  3.  Bipolar d/o             -difficult to control per records             -sees Dr. Jennelle Human frequently  4.  Discussed with patient that she really does not need neurologic follow-up at this point in time.  She was happy to hear this.  08/08/2022 appointment noted: No med changes. Still having a lot of anxiety and worries too much. Worse than depression.  No mania since here.  No sig avoidance.   Hard time concentration. Still having irritability and anger. SE drowsy with clozapine in AM and hard to function until about 10 AM.  Takes it about 8 PM and then goes to bed.   No falling but is shuffling more since here.   Sleep 10 hours.    09/07/22 appt noted:   Consistently on clozapine 75 mg HS.  Too drowsy if takes 25 mg in AM. Doing so so .  A lot of anxiety, anger, irritation.   Avg 8-9 hours of sleep and pushes herself to get up .  Hard to function in am until 11 or 12 noon. Ingreza 40 mg BID still some mouth movements and biting tongue.  Is better with Ingrezza but not gone.   SE consitpation and drowsy.   She is aware of the difficulty finding balance between aenough med to manage her sx and not so much to cause intolerable SE.  Disc dosing of her meds. Plan: Augment clozapine with fluvoxamine 25 mg HS  10/11/22 appt noted: : Current psych meds: Clozapine 75 mg nightly, Focalin 15 mg twice daily, fluvoxamine 25 mg nightly, lamotrigine 100 mg twice daily, Ingrezza 40 mg twice daily No  noticeable change with fluvoxamine. Drooling worse in am and stupor until about 11 AM.   Mood still not good , angry and irritable a lot.  H acuses he rof going off her meds.  Less mouth movements and biting tonue. Sleep 9-10 hours. Anxiety still through the roof. Tremor better right now unless weak.   H prostate CA with bone mets and on pain meds. Plan: Augment clozapine with fluvoxamine but increase 50mg  HS also to help anxiety, irritability  11/09/2022 appointment noted: Added fluvoxamine 50 mg HS.  And is less anxious and more grounded.  Half as irritable as before fluvoxamine.   Down side is shuffling steps seem worse.  Not dizzy usually but balance isn't good.  Gets better after the day progresses.    Overall does feel improved.   Trembling better and mouth movements much better but drooling is bad nothing seems to help that. Drools in public is embarrassing. Tired a lot better as day progresses.   Plan: Augment clozapine with fluvoxamine but REDUCE TO 37.5 mg mg HS bc more shuffling of feet .  And reduced clozapine to 50 mg daily.  But did help anxiety, irritability Ingrezza for TD helped stop tongue chewing but still some mouth movements at 80 mg, which was started 05/23/22.  Shuffling a little more and can't reduce Ingrezza.  Split ingrezza 40 BID  12/12/22 appt noted: Made changes as above.  Asks about increasing Focalin 20 BID bc lack of energy and motivation and productivity.  No SE.   No jittery,  HA. Usally sleep Is good but not always. Still shuffling if gets up at night or before morning Focalin.  Then it clears up.  No change  in shuffling since here last visit.  Other day used H's walker.  Like losing balance.   Mouth movements still there but not nearly as bad.  Worse if forgets a dose of Ingrezza.   Mood depressed and more anxious than last visit.  Constantly obsessive thinking about things that could go wrong.  More short tempered.  Impaitent at home only. H got bad report  from onc that current chemo not workingand it is changed with limited success rate.  This affects her mood too.   No change in sleep. Plan: Plan: First 2 weeks reduce Ingrezza to 60 mg at night to see if shuffling is better with samples. If shuffling is better call office for change in RX If so then increase clozapine back to 3 of the 25 mg capsules and fluvoxamine back to 50 mg .  12/19/22 TC with nurse: Lambert Keto, LPN   TS   1/61/09 11:16 AM Note Pt was in to see her therapist, Rockne Menghini today and asked to speak with nurse. She reports having apt with Dr. Jennelle Human last week on April 8th, and he reduced her Ingrezza to 60 mg, she feels that the issue isn't coming from that but the Fluvoxamine that she was put on a few months back. She reports she may not have explained herself well enough at the apt. She reports when she wakes up during the night she has to shuffle and hold on to the wall to keep from falling. She reports her husband has cancer and she needs to be more alert and able to function in case of emergency with him.  She reports nothing changed with the decrease in that medication.    Informed her I would discuss with Dr. Jennelle Human and get back with her.     12/19/22 MD resp:  Rip Harbour.  Reduce fluvoxamine from 1 and 1/2 tablets at night to 1/2 tablet twice daily.  Split dose to reduce SE      01/09/23 appt noted: Meds: clozapine 50 mg HS, reduced fluvoxamine 12.5 mg Am, forcalin 15 BID, Ingrezza 40 BID, lamotrigine 100 BID,  Not doing well.  Dep and high anxiety.  Some irritability.  Mood swings not dramatic.  No SI. Balance issues when up in middle of night.  Does better once takes morning meds with focalin.   Sometimes forgets afternoon Focalin. Mouth movements still there but better.  Drooling stopped. A lot of things seem like too much working.  Still some wiggling toes and may chew side of mouth , not severe. Plan: Plan: DC fluvoxamine Trial Viibryd 5 mg daily for 1 week  then 10 mg daily.  02/06/23 appt noted: Meds:  viibryd 10 HS, clozapine 50 mg HS, off  fluvoxamine,  focalin 15 BID, Ingrezza 40 BID, lamotrigine 100 BID,  Walking better at night wihout fluvoxamine and less dep but mood swings and anxiety through the roof.   Seems higher than last time.  Worry about everything.   Reduced appetite.  Some am nausea.   Upset with H's drinking.  He has terminal CA, prostate stage 4 mets to bones.  Hared living with someone terminal and also alcoholic.  Living with a lot of stress.   Less falling.   Mouth movements seem worse.  No drooling. Plan: increase Clozapine 62.5 mg HS for a week for mood swings and anxity and if fails but no SE then increase to 75 mg HS. Split ingrezza but increase bc TD worse lately to 60  mg BID  03/14/23 appt Noted: Increased clozapine to 75 mg HS. No SE except sedating at night.   Anxiety is still high but dep seems better.  Worry over H's CA and hosp last weekend.  Lots of medical bills.  General worry mostly about H's CA and alcoholism. Couple mood swings and racing obsessive thinking briefly. Sleeping well.  No SI Loss of appetite.   Plan: Increase to  Viibryd 15 mg daily for anxiety.   04/10/23 appt noted: Meds:  viibryd incr to 20 HS 2 weeks, clozapine 75 mg HS,  focalin usually 20 AM, Ingrezza 60 BID, lamotrigine 100 BID,  Noticed improvement in dep with incr Viibryd 20 mg without much change in anxiety.  Stress level is still high also.  H health getting worse and still drinking and fears having to call EMT bc of this. Taking Viibryd with food. Still some dizziness but no falls. Sleep is good.  No major mood swings. Still in counseling  and working on anxiety and low confidence. Worries over having to take care of finances.   Some racing thoughts in brief spells. Wants better cotrol of anxiety with constant worry and still irritable.  Past Psychiatric Medication Trials: Vraylar 4.5 SE mouth movements reduced to 3 mg 3/20. It was  effective at lower doses for depression.  Worse off it.  Vraylar 1.5 mg every third day led to relapse of significant depression. Caplyta SE at 42 mg .  Cost problems Latuda 80, , olanzapine, Seroquel, risperidone, Abilify, loxapine 25 mg BID (max tolerated) NR, Clozapine 75  Ingrezza 40 BID  lithium 150 tremor   Trileptal 450, Depakote, Equetro 300 hx balance issues, CBZ ER falling,   Lamictal 200 twice daily,  Focalin,  Ritalin,  fluoxetine 60,  sertraline 100, Wellbutrin history of facial tics, paroxetine cognitive side effects, Lexapro 10 worse Fluvoxamine 50  buspirone,   ropinirole, amantadine, Sinemet, Artane, Cogentin,  pramipexole 0.5 mg BID helped tremor but caused anger trazodone hangover, Ambien hangover,    Review of Systems:  Review of Systems  HENT:  Positive for dental problem and tinnitus.        Chirping cricket sounds in hears since January 2023 drooling  Respiratory:  Negative for cough.   Cardiovascular:  Negative for chest pain.  Gastrointestinal:  Negative for abdominal pain and nausea.       GERD awakening her  Musculoskeletal:  Positive for arthralgias and gait problem.  Neurological:  Positive for dizziness and tremors. Negative for weakness.       Ankle problems and balance problems. No falls lately. Occ stumbles. Tremor is better in hands Mouth movements  Psychiatric/Behavioral:  Positive for dysphoric mood. Negative for agitation, behavioral problems, confusion, decreased concentration, hallucinations, self-injury, sleep disturbance and suicidal ideas. The patient is nervous/anxious. The patient is not hyperactive.   No falls since here. Not currently depressed but unable to remove this from the list.  Medications: I have reviewed the patient's current medications.  Current Outpatient Medications  Medication Sig Dispense Refill   acetaminophen (TYLENOL) 650 MG CR tablet Take 1,300 mg by mouth as needed for pain.     albuterol (VENTOLIN HFA)  108 (90 Base) MCG/ACT inhaler Inhale 2 puffs into the lungs every 4 (four) hours as needed for wheezing or shortness of breath. 18 g 0   atorvastatin (LIPITOR) 20 MG tablet Take 1 tablet (20 mg total) by mouth every evening. 90 tablet 3   Ferrous Gluconate (IRON 27 PO) Take by mouth.  FOCALIN 10 MG tablet TAKE 1 AND 1/2 TABLETS TWICE DAILY 90 tablet 0   ketoconazole (NIZORAL) 2 % cream Apply 1 Application topically daily. 60 g 0   lamoTRIgine (LAMICTAL) 100 MG tablet Take 1 tablet (100 mg total) by mouth 2 (two) times daily. 60 tablet 0   Melatonin 10 MG CAPS Take by mouth at bedtime as needed.     pantoprazole (PROTONIX) 40 MG tablet Take 1 tablet (40 mg total) by mouth 2 (two) times daily. 180 tablet 3   promethazine-dextromethorphan (PROMETHAZINE-DM) 6.25-15 MG/5ML syrup Take 5 mLs by mouth 4 (four) times daily as needed. 100 mL 0   valbenazine (INGREZZA) 60 MG capsule Take 1 capsule (60 mg total) by mouth 2 (two) times daily. 60 capsule 1   Vilazodone HCl 20 MG TABS Take 1 tablet (20 mg total) by mouth daily. 30 tablet 1   cloZAPine (CLOZARIL) 100 MG tablet Take 1 tablet (100 mg total) by mouth at bedtime. 30 tablet 1   No current facility-administered medications for this visit.    Medication Side Effects: Other: tremor and weight gain.   Dyskinesia appears better  SE bettter than they were.  Balance problems intermittently  Allergies:  Allergies  Allergen Reactions   Azithromycin Anaphylaxis   Penicillins Anaphylaxis    DID THE REACTION INVOLVE: Swelling of the face/tongue/throat, SOB, or low BP? Yes Sudden or severe rash/hives, skin peeling, or the inside of the mouth or nose? Yes Did it require medical treatment? No When did it last happen?       If all above answers are "NO", may proceed with cephalosporin use.  Patient reacts to Z pack.  HAS Taken amoxicillin fine.   Adhesive [Tape] Other (See Comments)    On bandaids    Past Medical History:  Diagnosis Date   ADD  (attention deficit disorder)    Allergy    Seasonal   Anemia    History of GI blood loss   Anxiety    Arthritis    Atrophy of vagina 10/07/2020   Bipolar 1 disorder (HCC)    Cancer (HCC)    Colon polyps    Depression    Diabetes mellitus (HCC)    pt denies   Edema, lower extremity    Epistaxis    Around 2011 or 2012, required cauterization.    Esophageal stricture    Fracture of superior pubic ramus (HCC) 11/28/2018   GERD (gastroesophageal reflux disease)    Headache(784.0)    Hyperlipidemia    Interstitial cystitis    Joint pain    Lactose intolerance    Lung cancer (HCC) 2002   Neuromuscular disorder (HCC)    Obesity    Osteoarthritis    Palpitations    Sleep apnea    Doesn't use a CPAP   Suicidal ideation 01/20/2020   Swallowing difficulty    Tardive dyskinesia     Family History  Problem Relation Age of Onset   Arthritis Mother    Hearing loss Mother    Hyperlipidemia Mother    Hypertension Mother    Depression Mother    Anxiety disorder Mother    Obesity Mother    Sudden death Mother    Hypertension Father    Diabetes Mellitus II Father    Heart disease Father    Arthritis Father    Cancer Father        Brain   COPD Father    Diabetes Father    Hyperlipidemia Father  Sleep apnea Father    Early death Sister        Aneroxia/Bulimic   Depression Brother    Early death Hydrographic surveyor Accident   Stroke Maternal Grandmother    Hypertension Maternal Grandmother    Arthritis Maternal Grandfather    Heart attack Maternal Grandfather    Hearing loss Maternal Grandfather    Depression Daughter    Drug abuse Daughter    Heart disease Daughter    Hypertension Daughter    Colon cancer Neg Hx    Esophageal cancer Neg Hx    Rectal cancer Neg Hx     Social History   Socioeconomic History   Marital status: Married    Spouse name: Not on file   Number of children: 1   Years of education: 12   Highest education level: Not on file   Occupational History   Occupation: retired  Tobacco Use   Smoking status: Never   Smokeless tobacco: Never  Vaping Use   Vaping status: Never Used  Substance and Sexual Activity   Alcohol use: Yes    Alcohol/week: 1.0 standard drink of alcohol    Types: 1 Glasses of wine per week    Comment: 1 glass wine q few weeks   Drug use: No   Sexual activity: Yes  Other Topics Concern   Not on file  Social History Narrative   Pt lives in [place] with husband [name].  Followed by Dr. Jennelle Human for psychiatry and Rockne Menghini for therapy.   Right handed   Drinks caffeine   One story home   Married lives with husband   retired   International aid/development worker of Corporate investment banker Strain: Low Risk  (05/11/2022)   Overall Financial Resource Strain (CARDIA)    Difficulty of Paying Living Expenses: Not hard at all  Food Insecurity: No Food Insecurity (05/11/2022)   Hunger Vital Sign    Worried About Running Out of Food in the Last Year: Never true    Ran Out of Food in the Last Year: Never true  Transportation Needs: No Transportation Needs (05/11/2022)   PRAPARE - Administrator, Civil Service (Medical): No    Lack of Transportation (Non-Medical): No  Physical Activity: Inactive (05/11/2022)   Exercise Vital Sign    Days of Exercise per Week: 0 days    Minutes of Exercise per Session: 0 min  Stress: No Stress Concern Present (05/09/2021)   [employer] of Occupational Health - Occupational Stress Questionnaire    Feeling of Stress : Only a little  Social Connections: Moderately Integrated (05/11/2022)   Social Connection and Isolation Panel [NHANES]    Frequency of Communication with Friends and Family: More than three times a week    Frequency of Social Gatherings with Friends and Family: More than three times a week    Attends Religious Services: More than 4 times per year    Active Member of [employer] or Organizations: No    Attends Banker Meetings: Never    Marital  Status: Married  Catering manager Violence: Unknown (12/10/2021)   Received from [facility]   HITS    Physically Hurt: Not on file    Insult or Talk Down To: Not on file    Threaten Physical Harm: Not on file    Scream or Curse: Not on file    Past Medical History, Surgical history, Social history, and Family history were reviewed  and updated as appropriate.   Please see review of systems for further details on the patient's review from today.   Objective:   Physical Exam:  There were no vitals taken for this visit.  Physical Exam Neurological:     Mental Status: She is alert and oriented to person, place, and time.     Cranial Nerves: No dysarthria.     Gait: Gait normal.     Comments: Lip licking consistent Good gait  Psychiatric:        Attention and Perception: Attention and perception normal.        Mood and Affect: Mood is anxious. Mood is not depressed.        Speech: Speech normal.        Behavior: Behavior is cooperative.        Thought Content: Thought content normal. Thought content is not paranoid or delusional. Thought content does not include homicidal or suicidal ideation. Thought content does not include suicidal plan.        Cognition and Memory: Cognition and memory normal.        Judgment: Judgment normal.     Comments: Insight intact     Lab Review:     Component Value Date/Time   NA 142 03/22/2023 0916   K 3.9 03/22/2023 0916   CL 103 03/22/2023 0916   CO2 23 03/22/2023 0916   GLUCOSE 93 03/22/2023 0916   GLUCOSE 112 (H) 05/25/2022 1319   BUN 12 03/22/2023 0916   CREATININE 1.04 (H) 03/22/2023 0916   CALCIUM 9.9 03/22/2023 0916   PROT 7.2 03/22/2023 0916   ALBUMIN 4.5 03/22/2023 0916   AST 27 03/22/2023 0916   ALT 14 03/22/2023 0916   ALKPHOS 68 03/22/2023 0916   BILITOT 0.7 03/22/2023 0916   GFRNONAA >60 05/25/2022 1319   GFRAA 96 03/02/2020 1433       Component Value Date/Time   WBC 8.8 03/22/2023 0916   WBC 7.4 01/19/2020 1158    RBC 5.14 03/22/2023 0916   RBC 4.19 01/19/2020 1158   HGB 14.8 03/22/2023 0916   HCT 47.3 (H) 03/22/2023 0916   PLT 225 03/22/2023 0916   MCV 92 03/22/2023 0916   MCH 28.8 03/22/2023 0916   MCH 30.3 01/19/2020 1158   MCHC 31.3 (L) 03/22/2023 0916   MCHC 32.1 01/19/2020 1158   RDW 13.8 03/22/2023 0916   LYMPHSABS 3.4 (H) 03/22/2023 0916   MONOABS 0.7 04/04/2019 1004   EOSABS 0.2 03/22/2023 0916   BASOSABS 0.0 03/22/2023 0916  Vitamin D level 33 on 10K units daily on 12/4/`9 Increased to prescription vitamin d 50K units Monday, Wed, Friday.  Rx sent in.   Lithium Lvl  Date Value Ref Range Status  10/21/2018 0.18 (L) 0.60 - 1.20 mmol/L Final    Comment:    Performed at [facility], [address], [place], [place] [address]     No results found for: "PHENYTOIN", "PHENOBARB", "VALPROATE", "CBMZ"   .res Assessment: Plan:    Bipolar disorder with moderate depression (HCC) - Plan: Vilazodone HCl 20 MG TABS, cloZAPine (CLOZARIL) 100 MG tablet  Generalized anxiety disorder - Plan: Vilazodone HCl 20 MG TABS  Attention deficit hyperactivity disorder (ADHD), predominantly inattentive type  Insomnia due to mental condition  Long term current use of clozapine  Tardive dyskinesia  Mild cognitive impairment  Low vitamin D level  Tremor of both hands  Greater than 50% of 30 min face to face time with patient was spent on counseling  and coordination of care. We discussed multiple dxes and concerns.   Diore has chronic rapid cycling bipolar disorder which is chronically unstable and has been difficult to control.  Failed 14 different mood stabilizers.  The rapid cycling is making it difficult to control frequency of depressive episodes and the anxiety as well.  We have typically had to make frequent med changes.   Disc gradual increase bc med sensitivity.  Med sensitivity to SE seems to be the biggest problem preventing better mood control    Ingrezza for TD helped stop tongue  chewing but still some mouth movements at 80 mg, which was started 05/23/22.  Shuffling and drooling resolved  Split ingrezza but increase bc TD worse lately to 60 mg BID Consider Austedo.  Disc ECT. Only FDA approved option left. She wants to defer.    Extensive discussion of clozapine dosing recommendations but we will increase more slowly bc she is so med sensitive.Disc risk low WBC, cardiomyopathy, etc, sedation  (Started clozapine on 12/07/21).   Having SE drooling and mild sedation.  Eventually sedation likely to get better. Check CBC with diff every 4 weeks.  now For anxiety and irritability, reactivity incr clozapine to 100 mg HS Likely to work better than incr Viibryd but consider latter.  Prone to UTI and may be causing confusion discussed.  Had confirmed UTI recently.  She has a high residual anxiety.  It has been impossible to control all of her symptoms simultaneously without causing side effects. Failed various meds.  Discussed side effects of each medicine. Continue   Focalin IR 15 mg BID DT Cost and off label for depression  Try to spread this out if possible for mood.  She feels this is necessary  .  Consider increase per her request at next visit. Would like to avoid BZ if possible.  Lamotrigine 100 mg twice daily to try to reduce polypharmacy and so improve tolerability of clozapine.  consider reduction if clozapine helps.  Failed all reasonable alternatives for anxiety.  Option viibryd, Auvelity  Discussed potential metabolic side effects associated with atypical antipsychotics, as well as potential risk for movement side effects. Advised pt to contact office if movement side effects occur.    Checked B12 folate bc memory complaints.  Normal B12 & folate on 05/25/22  Disc SE meds and this is heightened by the complication of necessary polypharmacy.  Supportive  and cognitive behavioral therapy in terms of dealing with husband's addiction and now new dx metastatic  prostate CA..  breaking down tasks into manageable parts.  Grief.   Rec exercise as it would help.  Take advantage of family support  Continue Viibryd 20 mg daily for anxiety and depression. Take with food.   and usually with dinner. May get more benefit with time.  Requires frequent FU DT chronic instability.  Wants to schedule monthly.  FU 4 weeks.   Meredith Staggers MD, DFAPA  Please see After Visit Summary for patient specific instructions.    Future Appointments  Date Time Provider Department Center  04/24/2023 10:00 AM Mathis Fare, LCSW [facility] None  05/04/2023  9:45 AM Bella Kennedy, PT [facility] None  05/09/2023 10:30 AM Cottle, Steva Ready., MD [facility] None  05/10/2023 10:00 AM Mathis Fare, LCSW [facility] None  05/29/2023 10:00 AM Mathis Fare, LCSW [facility] None  06/08/2023 10:30 AM Cottle, Steva Ready., MD [facility] None  06/12/2023 10:00 AM Mathis Fare, LCSW [facility] None  06/26/2023 10:00 AM Mathis Fare, LCSW [facility] None  07/10/2023 10:00 AM Cottle, Steva Ready., MD [facility] None  09/27/2023  1:00 PM Anabel Halon, MD [facility]    No orders of the defined types were placed in this encounter.      -------------------------------

## 2023-04-12 ENCOUNTER — Other Ambulatory Visit: Payer: Self-pay

## 2023-04-12 DIAGNOSIS — G2401 Drug induced subacute dyskinesia: Secondary | ICD-10-CM

## 2023-04-12 MED ORDER — VALBENAZINE TOSYLATE 60 MG PO CAPS
60.0000 mg | ORAL_CAPSULE | Freq: Two times a day (BID) | ORAL | 1 refills | Status: AC
Start: 2023-04-12 — End: ?

## 2023-04-21 ENCOUNTER — Other Ambulatory Visit: Payer: Self-pay | Admitting: Psychiatry

## 2023-04-21 ENCOUNTER — Other Ambulatory Visit: Payer: Self-pay | Admitting: Internal Medicine

## 2023-04-21 DIAGNOSIS — K219 Gastro-esophageal reflux disease without esophagitis: Secondary | ICD-10-CM

## 2023-04-21 DIAGNOSIS — F3132 Bipolar disorder, current episode depressed, moderate: Secondary | ICD-10-CM

## 2023-04-24 ENCOUNTER — Ambulatory Visit (INDEPENDENT_AMBULATORY_CARE_PROVIDER_SITE_OTHER): Payer: No Typology Code available for payment source | Admitting: Psychiatry

## 2023-04-24 DIAGNOSIS — F3132 Bipolar disorder, current episode depressed, moderate: Secondary | ICD-10-CM | POA: Diagnosis not present

## 2023-04-24 NOTE — Progress Notes (Signed)
[facility] Counselor/Therapist Progress Note  Patient ID: Megan Whitney, MRN: [id number],    Date: 04/24/2023  Time Spent: 53 minutes   Treatment Type: Individual Therapy  Reported Symptoms: depression, anxiety, some obsessive thought at times, less appetite, has lost approx 50 lbs since January and is contacting PCP today about this; grief , anger re: husband's drinking; had SI "a couple days ago but now" and we did talk through this in session today and she continues to say she is not having any current SI.   Mental Status Exam:  Appearance:   Neat     Behavior:  Appropriate, Sharing, and Motivated  Motor:  Normal  Speech/Language:   Clear and Coherent  Affect:  Depressed and Flat  Mood:  anxious, depressed, and sad  Thought process:  goal directed  Thought content:    Some obsessive thoughts  Sensory/Perceptual disturbances:    WNL  Orientation:  oriented to person, place, time/date, situation, day of week, month of year, year, and stated date of Aug. 19, 2024  Attention:  Fair  Concentration:  Fair  Memory:  WNL  Fund of knowledge:   Good  Insight:    Fair  Judgment:   Fair  Impulse Control:  Good   Risk Assessment: Danger to Self:  No Self-injurious Behavior: No Danger to Others: No Duty to Warn:no Physical Aggression / Violence:No  Access to Firearms a concern: No  Gang Involvement:No   Subjective:  Patient in today reporting depression, anxiety, grief, anger, some obsessive thoughts, and decreased appetite ("due to all I'm dealing with"). Is going to check with her PCP on her weight loss as it has increased more recently, but "not feeling sick at all".  Has had prior SI but denying any within past several days. Talked this through in session today and able to commit to no self-harm. Has some good friends who are standing by her very supportively. Needed session today to vent her concerns, fears, sadnesses re: her husband's cancer and going against medical  advice.  Concerned "how my expectations and behavior is sometimes impacting friendships and talked further about this today including some role-playing which seemed helpful.   Interventions: Cognitive Behavioral Therapy and Ego-Supportive  Long Term Goal: Reduce overall level, frequency, and intensity of the anxiety so that daily functioning is not impaired. Short Term Goal: 1.Increase understanding of the beliefs and messages that produce the worry and anxiety. Strategies: 1.Help client develop reality-based positive cognitive messages/self-talk. 2. Develop a "coping card" or other reminder which coping strategies are recorded for patient's later use  Diagnosis:   ICD-10-CM   1. Bipolar disorder with moderate depression (HCC)  F31.32      Plan: Patient in session today focusing more on her symptoms of depression, anxiety, grief, anger, and obsessive thoughts.  Some anger is noted within the relationship with her husband as he does not always follow medical advice at times related to his continued alcohol abuse and how it impacts his terminal illness. Patient making progress and needs to continue be faster working with goal-directed behaviors in order to keep moving in a forward direction. Encouraged patient and reaching out to others for support as this is proven to be helpful to her.  She fluctuates between feeling increased strength at sometimes and doubting herself at other times.  Is showing some progress even in her commitment to return to therapy with a new therapist as she states she does want to progress and move forward.  Reminded and encouraged her in practicing more positive and self affirming behaviors as noted in session including: Staying focused on making good decisions versus impulsive decisions, believe more in herself and her ability to manage challenging circumstances, improvement in her own self-care, refrain from self sabotage in her goals, stay in contact with supportive  family and friends, use of journaling between sessions which has proven to be helpful for her, setting limits with people who are not supportive, refrain from assuming worst-case scenarios, look for the strengths and positives within herself, spending time with her 2 dogs which is very therapeutic for her, use of encouraging self talk, and recognize the strength she shows when working with goal-directed behaviors to move in a direction that supports her improved emotional health and overall wellbeing.   Goal review and progress/challenges noted with patient.  Next appointment within 3 weeks.   Mathis Fare, LCSW

## 2023-05-03 ENCOUNTER — Other Ambulatory Visit: Payer: Self-pay | Admitting: Psychiatry

## 2023-05-03 DIAGNOSIS — F314 Bipolar disorder, current episode depressed, severe, without psychotic features: Secondary | ICD-10-CM

## 2023-05-04 ENCOUNTER — Ambulatory Visit (HOSPITAL_COMMUNITY): Payer: No Typology Code available for payment source | Attending: Internal Medicine | Admitting: Physical Therapy

## 2023-05-04 ENCOUNTER — Other Ambulatory Visit: Payer: Self-pay

## 2023-05-04 DIAGNOSIS — M545 Low back pain, unspecified: Secondary | ICD-10-CM | POA: Insufficient documentation

## 2023-05-04 DIAGNOSIS — F319 Bipolar disorder, unspecified: Secondary | ICD-10-CM | POA: Diagnosis not present

## 2023-05-04 DIAGNOSIS — M5136 Other intervertebral disc degeneration, lumbar region: Secondary | ICD-10-CM | POA: Insufficient documentation

## 2023-05-04 DIAGNOSIS — M6281 Muscle weakness (generalized): Secondary | ICD-10-CM | POA: Insufficient documentation

## 2023-05-04 DIAGNOSIS — Z79899 Other long term (current) drug therapy: Secondary | ICD-10-CM | POA: Diagnosis not present

## 2023-05-04 NOTE — Therapy (Signed)
OUTPATIENT PHYSICAL THERAPY THORACOLUMBAR EVALUATION   Patient Name: Megan Whitney MRN: [id number] DOB:1956/02/03, 67 y.o., female Today's Date: 05/04/2023  END OF SESSION:  PT End of Session - 05/04/23 1245     Visit Number 1    Number of Visits 12    Date for PT Re-Evaluation 06/15/23    Authorization Type devoted    Progress Note Due on Visit 10    PT Start Time 0940    PT Stop Time 1035    PT Time Calculation (min) 55 min             Past Medical History:  Diagnosis Date   ADD (attention deficit disorder)    Allergy    Seasonal   Anemia    History of GI blood loss   Anxiety    Arthritis    Atrophy of vagina 10/07/2020   Bipolar 1 disorder (HCC)    Cancer (HCC)    Colon polyps    Depression    Diabetes mellitus (HCC)    pt denies   Edema, lower extremity    Epistaxis    Around 2011 or 2012, required cauterization.    Esophageal stricture    Fracture of superior pubic ramus (HCC) 11/28/2018   GERD (gastroesophageal reflux disease)    Headache(784.0)    Hyperlipidemia    Interstitial cystitis    Joint pain    Lactose intolerance    Lung cancer (HCC) 2002   Neuromuscular disorder (HCC)    Obesity    Osteoarthritis    Palpitations    Sleep apnea    Doesn't use a CPAP   Suicidal ideation 01/20/2020   Swallowing difficulty    Tardive dyskinesia    Past Surgical History:  Procedure Laterality Date   BALLOON DILATION  05/16/2012   Procedure: BALLOON DILATION;  Surgeon: Louis Meckel, MD;  Location: [facility] ENDOSCOPY;  Service: Endoscopy;  Laterality: N/A;   BUNIONECTOMY  2011   COLONOSCOPY     ENTEROSCOPY  05/16/2012   Procedure: ENTEROSCOPY;  Surgeon: Louis Meckel, MD;  Location: [facility] ENDOSCOPY;  Service: Endoscopy;  Laterality: N/A;   JOINT REPLACEMENT     right shoulder durgery 25 yrs ago  1988   TOTAL HIP ARTHROPLASTY Bilateral 2006, 2008   bilateral   TUBAL LIGATION  1990   WEDGE RESECTION  2002   lung cancer   Patient Active Problem  List   Diagnosis Date Noted   DDD (degenerative disc disease), lumbar 03/27/2023   Class 1 obesity due to excess calories without serious comorbidity in adult 11/29/2022   Chronic thoracic back pain 09/22/2022   Neuroleptic-induced tardive dyskinesia 07/20/2022   Intertrigo 05/19/2022   Encounter for general adult medical examination with abnormal findings 03/22/2022   Arthritis of knee 08/17/2021   Polyp of colon 02/04/2021   Seasonal allergies 10/07/2020   Osteopenia 10/07/2020   Gait disturbance 10/07/2020   Hyperlipidemia 06/11/2020   Sleep apnea 06/11/2020   Stricture and stenosis of esophagus 05/16/2012   Hiatal hernia 05/16/2012   Dysphagia, unspecified(787.20) 05/15/2012   Depression    Bipolar disorder (HCC)    GERD (gastroesophageal reflux disease) 09/14/2010   Personal history of colonic polyps 09/14/2010    PCP: Trena Platt  REFERRING PROVIDER: Anabel Halon, MD  REFERRING DIAG: M51.36 (ICD-10-CM) - DDD (degenerative disc disease), lumbar  Rationale for Evaluation and Treatment: Rehabilitation  THERAPY DIAG:  Lumbar pain  Muscle weakness (generalized)  ONSET DATE: chronic with acute exacerbation last month.  SUBJECTIVE:                                                                                                                                                                                           SUBJECTIVE STATEMENT: Pt states that she is aware that she tends to sit and list to the left side but is unable to correct this.   Pt states that her pain is worse in the afternoon.  By late afternoon she can not be up longer than five minutes.  The pain is achy and is mainly on the right side of her back, the more she does the worse it hurts and it will radiate to the side.    PERTINENT HISTORY:   67 y.o. female with past medical history of bipolar disorder, ADD, anxiety, OSA, GERD, HLD and osteopenia and lumbar pain   PAIN:  Are you having pain? Yes:  NPRS scale: 1/10, worst is an 9/10; best 0/10 in the mornings.  Pain location: Rt mid and lower back  Pain description: achy Aggravating factors: weight bearing Relieving factors: sitting , heating pad   PRECAUTIONS: None  RED FLAGS: None   WEIGHT BEARING RESTRICTIONS: No  FALLS:  Has patient fallen in last 6 months? No  LIVING ENVIRONMENT: Lives with: lives with their spouse Stairs: No Has following equipment at home: None  OCCUPATION: retired   PLOF: Independent with community mobility without device  PATIENT GOALS: less pain   NEXT MD VISIT: 6 months   OBJECTIVE:    PATIENT SURVEYS:  FOTO 43  SENSATION: WFL  POSTURE: rounded shoulders, decreased lumbar lordosis, increased thoracic kyphosis, and weight shift left    LUMBAR ROM:   AROM eval  Flexion Flexed at 10 degrees  Extension Able to go to neutral   Right lateral flexion   Left lateral flexion   Right rotation   Left rotation    (Blank rows = not tested)  *did not test forward flexion due to osteopenia. LOWER EXTREMITY MMT:    MMT Right eval Left eval  Hip flexion 5/5 4/5  Hip extension 4/5 4/5  Hip abduction 5/5 3+/5   Hip adduction    Hip internal rotation    Hip external rotation    Knee flexion 4/5 5/5  Knee extension 5/5 5/5  Ankle dorsiflexion 4/5 5/5  Ankle plantarflexion    Ankle inversion    Ankle eversion     (Blank rows = not tested)  FUNCTIONAL TESTS:  30 seconds chair stand test: 6 in 30 seconds (8 is poor for pt age and sex)  2 minute walk test: 394 feet pt leaning  to the left while walking  Single leg stance:  Lt: 12, RT 0   TODAY'S TREATMENT:                                                                                                                              DATE: 05/04/23   Correct Seated Posture   10 reps - 5" hold - Seated Cervical Retraction   10 reps - 5" hold - Seated Scapular Retraction   10 reps - 5" hold - Seated Transversus Abdominis Bracing 10 x  5"   PATIENT EDUCATION:  Education details: HEP Person educated: Patient Education method: Explanation, Verbal cues, and Handouts Education comprehension: verbalized understanding and returned demonstration  HOME EXERCISE PROGRAM: Access Code: [email or web address] URL: [email or web address] Date: 05/04/2023 Prepared by: Virgina Organ  Exercises - Correct Seated Posture  - 4 x daily - 7 x weekly - 1 sets - 10 reps - 5" hold - Seated Cervical Retraction  - 3 x daily - 7 x weekly - 1 sets - 10 reps - 5" hold - Seated Scapular Retraction  - 3 x daily - 7 x weekly - 1 sets - 10 reps - 5" hold - Seated Transversus Abdominis Bracing  - 3 x daily - 7 x weekly - 10 reps - 5" hold  ASSESSMENT:  CLINICAL IMPRESSION: Patient is a 67 y.o. F who was seen today for physical therapy evaluation and treatment for lumbar pain.  PT evaluation demonstrates posture dysfunction, decreased strength in core and LE mm, decreased balance, decreased ROM, decreased activity tolerance and increased pain.  Ms. Schuchmann will benefit from skilled PT to address these issues and maximize her functional ability.  OBJECTIVE IMPAIRMENTS: Abnormal gait, decreased activity tolerance, decreased balance, decreased mobility, difficulty walking, decreased ROM, decreased strength, increased fascial restrictions, postural dysfunction, and pain.   ACTIVITY LIMITATIONS: carrying, lifting, standing, squatting, stairs, and locomotion level  PARTICIPATION LIMITATIONS: meal prep, cleaning, laundry, shopping, and community activity  PERSONAL FACTORS: Time since onset of injury/illness/exacerbation and 1-2 comorbidities: OA and osteopenia  are also affecting patient's functional outcome.   REHAB POTENTIAL: Good  CLINICAL DECISION MAKING: Evolving/moderate complexity  EVALUATION COMPLEXITY: Moderate   GOALS: Goals reviewed with patient? No  SHORT TERM GOALS: Target date: 05/25/23  Pt to be I in HEP in order to decrease  her pain to no greater than a 6/10 Baseline: Goal status: INITIAL  2.  Pt to improve her core strength to be able to stand in an upright position  Baseline:  Goal status: INITIAL  3.  PT to be able to single leg stance for 10 seconds on her RT LE to decrease risk of falling  Baseline:  Goal status: INITIAL   LONG TERM GOALS: Target date: 06/15/23  Pt to be I in an advanced HEP in order to decrease her pain to no greater than a 3/10 Baseline:  Goal status: INITIAL  2.  Pt to improve her core strength to  be able to stand/sit without listing to the left for 30 minutes. Baseline:  Goal status: INITIAL  3.  PT to be able to single leg stance for 20 seconds on her RT LE to decrease risk of falling Baseline:  Goal status: INITIAL   PLAN:  PT FREQUENCY: 2x/week  PT DURATION: 6 weeks  PLANNED INTERVENTIONS: Therapeutic exercises, Therapeutic activity, Balance training, Gait training, Patient/Family education, Self Care, and Manual therapy.  PLAN FOR NEXT SESSION: begin decompression exercises with and without theraband.  Progress to stab exercises non weight bearing until pt can sit erect then add sitting. Once pt can stand with equal wt begin standing stab exercises.   Virgina Organ, PT CLT [phone]  05/04/2023, 12:46 PM

## 2023-05-05 ENCOUNTER — Encounter: Payer: Self-pay | Admitting: Psychiatry

## 2023-05-09 ENCOUNTER — Encounter: Payer: Self-pay | Admitting: Psychiatry

## 2023-05-09 ENCOUNTER — Ambulatory Visit: Payer: No Typology Code available for payment source | Admitting: Psychiatry

## 2023-05-09 DIAGNOSIS — R251 Tremor, unspecified: Secondary | ICD-10-CM

## 2023-05-09 DIAGNOSIS — F3132 Bipolar disorder, current episode depressed, moderate: Secondary | ICD-10-CM | POA: Diagnosis not present

## 2023-05-09 DIAGNOSIS — G2401 Drug induced subacute dyskinesia: Secondary | ICD-10-CM

## 2023-05-09 DIAGNOSIS — F314 Bipolar disorder, current episode depressed, severe, without psychotic features: Secondary | ICD-10-CM | POA: Diagnosis not present

## 2023-05-09 DIAGNOSIS — F411 Generalized anxiety disorder: Secondary | ICD-10-CM

## 2023-05-09 DIAGNOSIS — Z79899 Other long term (current) drug therapy: Secondary | ICD-10-CM

## 2023-05-09 DIAGNOSIS — F900 Attention-deficit hyperactivity disorder, predominantly inattentive type: Secondary | ICD-10-CM | POA: Diagnosis not present

## 2023-05-09 DIAGNOSIS — R7989 Other specified abnormal findings of blood chemistry: Secondary | ICD-10-CM

## 2023-05-09 DIAGNOSIS — G3184 Mild cognitive impairment, so stated: Secondary | ICD-10-CM

## 2023-05-09 DIAGNOSIS — F5105 Insomnia due to other mental disorder: Secondary | ICD-10-CM | POA: Diagnosis not present

## 2023-05-09 MED ORDER — VILAZODONE HCL 40 MG PO TABS: 40.0000 mg | ORAL_TABLET | Freq: Every day | ORAL | 1 refills | Status: DC

## 2023-05-09 MED ORDER — DEXMETHYLPHENIDATE HCL 10 MG PO TABS
15.0000 mg | ORAL_TABLET | Freq: Two times a day (BID) | ORAL | 0 refills | Status: DC
Start: 2023-05-09 — End: 2023-07-14

## 2023-05-09 NOTE — Progress Notes (Signed)
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 movements and biting tongue.  Sometimes hard to talk.  Drooling.  When tries to increase clozapine slurred speech and severe dizziness.   Mood is a little better.   Sleeping 8-9 hours.  So much better sleep with clozapine.   Still has anxiety, generalized. Plan: To minimize polypharmacy and improve tolerabilty:  Reduce Equetro to 1 of the 100 mg capsule at night for 1 week, then stop it. Wait 1 week then stop the carbamazepine chewable. Wait 1 more week then reduce clonidine to 1/2 at night for 1 week then stop it. Plan: Started clozapine  and continue 25 mg for now bc hasn't tolerated more so far.  02/08/22 appt noted: Mouth movements and biting tongue.  Sores on cheek with constant chewing movements. Sometimes hard to talk. Hypersalivation gets worse as day progresses. Sometimes balance problems.  Constipation managed.   Sleep very well.  8-9 hours. Off Equetro and on clozapine. Still has depression and anxiety without much change Plan: Started clozapine  and continue 25 mg for now bc hasn't tolerated more and need to start Ingrezza 40 mg for  TD.  03/23/2022 appointment with the following noted: Several phone calls since here.  Has gotten up to clozapine 37.5 mg nightly. Continues Focalin 15 mg twice daily, lamotrigine 200 mg twice daily, lithium 150 nightly. Started Ingrezza 40 mg daily. Ingrezza amazing difference but even with grant of $10000 can't afford it.  Not biting mouth and mouth less sore.  Less mouth movements but not gone Emotionally not real well with anxiety and depression and crying spells.  Also some irritability and anger.  Easily triggered anger. Sleep more broken with Ingrezza HS but 8-9 hours. Can be sedated if gets up early with slurred speech but not if full night sleep. Balance better off Equetro. Plan: Started clozapine but needs to increase bc minimal effect and better tolerance, so increase to 50 mg HS  04/05/22 appt noted: Increased clozapine to 50 mg HS.  Some groggy in the AM.  One day was dizzy.  Otherwise on occasion.  Takes it right before bed.   Still depression, hopeless, irritability and anger.  Some periods of racing thoughts.  Sometimes recognizes hypomanic episodes and somethimes doesn't recognize.  Dog is very sick and H with bone CA.  Not crying on clozapine as much. GERD and needs surgery for hiatal hernia. Signed up for water aerobics. Plan: Started clozapine but needs to increase bc minimal effect and better tolerance, so increase to 75 mg HS (Started clozapine on 12/07/21) Reduce librium 5 mg HS and plan to stop  05/10/22 appt med: TD partially better with Ingrezza 40.  Has  a grant.   Increased clozapine 75 mg HS, reduced Librium to 5 mg HS. Tolerated OK. Depression a little better.  Irritability still high.  Poor memory and easily confused. Sleep is pretty good and is better and needs to sleep longer.   Plan: DC librium Worsening TD partial response on 40 mg Ingrezza, increase to 60 mg daily   Continue clozapine 100 mg HS  05/17/2022 phone call complaining of sleeping more and feeling  sleepy and foggy thinking also dropping some things and drooling.  She was instructed to reduce the clozapine to 75 mg nightly to see if that was the problem. 05/23/2022 phone call asking to increase Ingrezza from 60 to 80 mg daily.  It was agreed. 06/16/2022 phone call stating she had a bad manic episode the week prior and is still feeling excessively sedated.  Also having hand tremors. Instructed to stop lithium and continue clozapine 75 mg nightly.  She is very med sensitive but we have few options left to treat her unstable bipolar disorder. 06/21/2022 phone call: After complaining of excessive sleepiness and excessive sleeping she is now complaining of insomnia.  07/07/22 appt noted: Current psychiatric medications include clozapine 75 mg nightly, Focalin 15 mg twice daily, lamotrigine 100 mg twice daily Ingrezza 80 mg daily.  She stopped lithium Has a list of concerns: SE drooling bad.   Still have mouth movements with the increase Ingrezza 80 mg daily but has stopped the tongue chewing. Goes to bed 9 PM and to sleep in 30 mins and awaken in the AM about 830 and hard to wake up.  Not napping in the day.  Getting enough sleep.   Notices Focalin kicking in when takes it. Mood depressed but not as bad.  Still some irritability, anger.  3 week ago bad manic anger lasting 3-4 days.  Not sure how her sleep was at the time. Some crying and poor impulse control.   PCP wanted 2nd opinion from neuro on ? PD, Dr. Arbutus Leas Nov 15. Plan: No med changes pending neurologic appointment  07/20/2022 neurology appointment Dr. Lurena Joiner Tat.  Diagnosed TD.  Assessment as follows: 1.  Tardive dyskinesia -The patients symptoms are most consistent with tardive dyskinesia.  She has had exposure to typical and atypical antipsychotic medication.  TD is a heterogeneous syndrome depending on a subtle balance between several neurotransmitters in the brain, including DA receptor blockade and hypersensitivity of DA and GABA  receptors. -pt on ingrezza since 02/2022 and both she and notes from Dr. Jennelle Human indicate great benefit.  2.  Tremor             -Largely resolved off of lithium and vraylar (vraylar d/c in 12/2021)             -She has very minor left hand tremor.  Did tell her that Ingrezza can occasionally cause parkinsonism, but I did not see a significant degree of that today.             -I did reassure her today that I saw no evidence of idiopathic Parkinson's  disease.  She was reassured.  3.  Bipolar d/o             -difficult to control per records             -sees Dr. Jennelle Human frequently  4.  Discussed with patient that she really does not need neurologic follow-up at this point in time.  She was happy to hear this.  08/08/2022 appointment noted: No med changes. Still having a lot of anxiety and worries too much. Worse than depression.  No mania since here.  No sig avoidance.   Hard time concentration. Still having irritability and anger. SE drowsy with clozapine in AM and hard to function until about 10 AM.  Takes it about 8 PM and then goes to bed.   No falling but is shuffling more since here.   Sleep 10 hours.    09/07/22 appt noted:   Consistently on clozapine 75 mg HS.  Too drowsy if takes 25 mg in AM. Doing so so .  A lot of anxiety, anger, irritation.   Avg 8-9 hours of sleep and pushes herself to get up .  Hard to function in am until 11 or 12 noon. Ingreza 40 mg BID still some mouth movements and biting tongue.  Is better with Ingrezza but not gone.   SE consitpation and drowsy.   She is aware of the difficulty finding balance between aenough med to manage her sx and not so much to cause intolerable SE.  Disc dosing of her meds. Plan: Augment clozapine with fluvoxamine 25 mg HS  10/11/22 appt noted: : Current psych meds: Clozapine 75 mg nightly, Focalin 15 mg twice daily, fluvoxamine 25 mg nightly, lamotrigine 100 mg twice daily, Ingrezza 40 mg twice daily No noticeable change with  fluvoxamine. Drooling worse in am and stupor until about 11 AM.   Mood still not good , angry and irritable a lot.  H acuses he rof going off her meds.  Less mouth movements and biting tonue. Sleep 9-10 hours. Anxiety still through the roof. Tremor better right now unless weak.   H prostate CA with bone mets and on pain meds. Plan: Augment clozapine with fluvoxamine but increase 50mg  HS also to help anxiety, irritability  11/09/2022 appointment noted: Added fluvoxamine 50 mg HS.  And is less anxious and more grounded.  Half as irritable as before fluvoxamine.   Down side is shuffling steps seem worse.  Not dizzy usually but balance isn't good.  Gets better after the day progresses.    Overall does feel improved.   Trembling better and mouth movements much better but drooling is bad nothing seems to help that. Drools in public is embarrassing. Tired a lot better as day progresses.   Plan: Augment clozapine with fluvoxamine but REDUCE TO 37.5 mg mg HS bc more shuffling of feet .  And reduced clozapine to 50 mg daily.  But did help anxiety, irritability Ingrezza for TD helped stop tongue chewing but still some mouth movements at 80 mg, which was started 05/23/22.  Shuffling a little more and can't reduce Ingrezza.  Split ingrezza 40 BID  12/12/22 appt noted: Made changes as above.  Asks about increasing Focalin 20 BID bc lack of energy and motivation and productivity.  No SE.   No jittery,  HA. Usally sleep Is good but not always. Still shuffling if gets up at night or before morning Focalin.  Then it clears up.  No change in shuffling since here  last visit.  Other day used H's walker.  Like losing balance.   Mouth movements still there but not nearly as bad.  Worse if forgets a dose of Ingrezza.   Mood depressed and more anxious than last visit.  Constantly obsessive thinking about things that could go wrong.  More short tempered.  Impaitent at home only. H got bad report from onc that current  chemo not workingand it is changed with limited success rate.  This affects her mood too.   No change in sleep. Plan: Plan: First 2 weeks reduce Ingrezza to 60 mg at night to see if shuffling is better with samples. If shuffling is better call office for change in RX If so then increase clozapine back to 3 of the 25 mg capsules and fluvoxamine back to 50 mg .  12/19/22 TC with nurse: Lambert Keto, LPN   TS   12/13/79 11:16 AM Note Pt was in to see her therapist, Rockne Menghini today and asked to speak with nurse. She reports having apt with Dr. Jennelle Human last week on April 8th, and he reduced her Ingrezza to 60 mg, she feels that the issue isn't coming from that but the Fluvoxamine that she was put on a few months back. She reports she may not have explained herself well enough at the apt. She reports when she wakes up during the night she has to shuffle and hold on to the wall to keep from falling. She reports her husband has cancer and she needs to be more alert and able to function in case of emergency with him.  She reports nothing changed with the decrease in that medication.    Informed her I would discuss with Dr. Jennelle Human and get back with her.     12/19/22 MD resp:  Rip Harbour.  Reduce fluvoxamine from 1 and 1/2 tablets at night to 1/2 tablet twice daily.  Split dose to reduce SE      01/09/23 appt noted: Meds: clozapine 50 mg HS, reduced fluvoxamine 12.5 mg Am, forcalin 15 BID, Ingrezza 40 BID, lamotrigine 100 BID,  Not doing well.  Dep and high anxiety.  Some irritability.  Mood swings not dramatic.  No SI. Balance issues when up in middle of night.  Does better once takes morning meds with focalin.   Sometimes forgets afternoon Focalin. Mouth movements still there but better.  Drooling stopped. A lot of things seem like too much working.  Still some wiggling toes and may chew side of mouth , not severe. Plan: Plan: DC fluvoxamine Trial Viibryd 5 mg daily for 1 week then 10 mg  daily.  02/06/23 appt noted: Meds:  viibryd 10 HS, clozapine 50 mg HS, off  fluvoxamine,  focalin 15 BID, Ingrezza 40 BID, lamotrigine 100 BID,  Walking better at night wihout fluvoxamine and less dep but mood swings and anxiety through the roof.   Seems higher than last time.  Worry about everything.   Reduced appetite.  Some am nausea.   Upset with H's drinking.  He has terminal CA, prostate stage 4 mets to bones.  Hared living with someone terminal and also alcoholic.  Living with a lot of stress.   Less falling.   Mouth movements seem worse.  No drooling. Plan: increase Clozapine 62.5 mg HS for a week for mood swings and anxity and if fails but no SE then increase to 75 mg HS. Split ingrezza but increase bc TD worse lately to 60 mg BID  03/14/23  appt Noted: Increased clozapine to 75 mg HS. No SE except sedating at night.   Anxiety is still high but dep seems better.  Worry over H's CA and hosp last weekend.  Lots of medical bills.  General worry mostly about H's CA and alcoholism. Couple mood swings and racing obsessive thinking briefly. Sleeping well.  No SI Loss of appetite.   Plan: Increase to  Viibryd 15 mg daily for anxiety.   04/10/23 appt noted: Psych meds:  viibryd incr to 20 HS 2 weeks, clozapine 75 mg HS,  focalin usually 20 AM, Ingrezza 60 BID, lamotrigine 100 BID,  Noticed improvement in dep with incr Viibryd 20 mg without much change in anxiety.  Stress level is still high also.  H health getting worse and still drinking and fears having to call EMT bc of this. Taking Viibryd with food. Still some dizziness but no falls. Sleep is good.  No major mood swings. Still in counseling  and working on anxiety and low confidence. Worries over having to take care of finances.   Some racing thoughts in brief spells. Wants better cotrol of anxiety with constant worry and still irritable. Plan: For anxiety and irritability, reactivity incr clozapine to 100 mg HS  05/09/23 appt noted: Psych  med: Clozapine 100 nightly, Focalin 15 twice daily, lamotrigine 100 twice daily, Ingrezza 60 mg twice daily, Viibryd 20 mg daily for 6 weeks. SE still shuffles at night bc afraid she will fall.  No worse.  No shuffling daytime. On occ taking Focalin 20 mg BID to help mood as primary benefit. Racing thoughts not often but not gone. Still a lot of worry.  Still has irritability. Some dep also but not as bad. Gets up one or two times nightly.     Past Psychiatric Medication Trials: Vraylar 4.5 SE mouth movements reduced to 3 mg 3/20. It was effective at lower doses for depression.  Worse off it.  Vraylar 1.5 mg every third day led to relapse of significant depression. Caplyta SE at 42 mg .  Cost problems Latuda 80, , olanzapine, Seroquel, risperidone, Abilify, loxapine 25 mg BID (max tolerated) NR, Clozapine 75  Ingrezza 40 BID  lithium 150 tremor   Trileptal 450, Depakote, Equetro 300 hx balance issues, CBZ ER falling,   Lamictal 200 twice daily,  Focalin,  Ritalin,  fluoxetine 60,  sertraline 100, Wellbutrin history of facial tics, paroxetine cognitive side effects, Lexapro 10 worse Fluvoxamine 50  buspirone,   ropinirole, amantadine, Sinemet, Artane, Cogentin,  pramipexole 0.5 mg BID helped tremor but caused anger trazodone hangover, Ambien hangover,    Review of Systems:  Review of Systems  HENT:  Positive for dental problem and tinnitus.        Chirping cricket sounds in hears since January 2023 drooling  Respiratory:  Negative for cough.   Cardiovascular:  Negative for chest pain.  Gastrointestinal:  Negative for abdominal pain and nausea.       GERD awakening her  Musculoskeletal:  Positive for arthralgias and gait problem.  Neurological:  Positive for dizziness and tremors. Negative for weakness.       Ankle problems and balance problems. No falls lately. Occ stumbles. Tremor is better in hands Mouth movements  Psychiatric/Behavioral:  Positive for dysphoric  mood. Negative for agitation, behavioral problems, confusion, decreased concentration, hallucinations, self-injury, sleep disturbance and suicidal ideas. The patient is nervous/anxious. The patient is not hyperactive.   No falls since here. Not currently depressed but unable to remove this  from the list.  Medications: I have reviewed the patient's current medications.  Current Outpatient Medications  Medication Sig Dispense Refill   acetaminophen (TYLENOL) 650 MG CR tablet Take 1,300 mg by mouth as needed for pain.     albuterol (VENTOLIN HFA) 108 (90 Base) MCG/ACT inhaler Inhale 2 puffs into the lungs every 4 (four) hours as needed for wheezing or shortness of breath. 18 g 0   atorvastatin (LIPITOR) 20 MG tablet Take 1 tablet (20 mg total) by mouth every evening. 90 tablet 3   cloZAPine (CLOZARIL) 100 MG tablet Take 1 tablet (100 mg total) by mouth at bedtime. 30 tablet 1   Ferrous Gluconate (IRON 27 PO) Take by mouth.     ketoconazole (NIZORAL) 2 % cream Apply 1 Application topically daily. 60 g 0   lamoTRIgine (LAMICTAL) 100 MG tablet Take 1 tablet (100 mg total) by mouth 2 (two) times daily. 120 tablet 0   Melatonin 10 MG CAPS Take by mouth at bedtime as needed.     pantoprazole (PROTONIX) 40 MG tablet TAKE (1) TABLET BY MOUTH TWICE DAILY. 180 tablet 1   promethazine-dextromethorphan (PROMETHAZINE-DM) 6.25-15 MG/5ML syrup Take 5 mLs by mouth 4 (four) times daily as needed. 100 mL 0   valbenazine (INGREZZA) 60 MG capsule Take 1 capsule (60 mg total) by mouth 2 (two) times daily. 60 capsule 1   Vilazodone HCl (VIIBRYD) 40 MG TABS Take 1 tablet (40 mg total) by mouth daily. 30 tablet 1   dexmethylphenidate (FOCALIN) 10 MG tablet Take 1.5 tablets (15 mg total) by mouth 2 (two) times daily. 90 tablet 0   No current facility-administered medications for this visit.    Medication Side Effects: Other: tremor and weight gain.   Dyskinesia appears better  SE bettter than they were.  Balance  problems intermittently  Allergies:  Allergies  Allergen Reactions   Azithromycin Anaphylaxis   Penicillins Anaphylaxis    DID THE REACTION INVOLVE: Swelling of the face/tongue/throat, SOB, or low BP? Yes Sudden or severe rash/hives, skin peeling, or the inside of the mouth or nose? Yes Did it require medical treatment? No When did it last happen?       If all above answers are "NO", may proceed with cephalosporin use.  Patient reacts to Z pack.  HAS Taken amoxicillin fine.   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    Esophageal stricture    Fracture of superior pubic ramus (HCC) 11/28/2018   GERD (gastroesophageal reflux disease)    Headache(784.0)    Hyperlipidemia    Interstitial cystitis    Joint pain    Lactose intolerance    Lung cancer (HCC) 2002   Neuromuscular disorder (HCC)    Obesity    Osteoarthritis    Palpitations    Sleep apnea    Doesn't use a CPAP   Suicidal ideation 01/20/2020   Swallowing difficulty    Tardive  dyskinesia     Family History  Problem Relation Age of Onset   Arthritis Mother    Hearing loss Mother    Hyperlipidemia Mother    Hypertension Mother    Depression Mother    Anxiety disorder Mother    Obesity Mother    Sudden death Mother    Hypertension Father    Diabetes Mellitus II Father    Heart disease Father    Arthritis Father    Cancer Father        Brain   COPD Father    Diabetes Father    Hyperlipidemia Father    Sleep apnea Father    Early death Sister        Aneroxia/Bulimic   Depression Brother    Early  death Hydrographic surveyor Accident   Stroke Maternal Grandmother    Hypertension Maternal Grandmother    Arthritis Maternal Grandfather    Heart attack Maternal Grandfather    Hearing loss Maternal Grandfather    Depression Daughter    Drug abuse Daughter    Heart disease Daughter    Hypertension Daughter    Colon cancer Neg Hx    Esophageal cancer Neg Hx    Rectal cancer Neg Hx     Social History   Socioeconomic History   Marital status: Married    Spouse name: Not on file   Number of children: 1   Years of education: 12   Highest education level: Not on file  Occupational History   Occupation: retired  Tobacco Use   Smoking status: Never   Smokeless tobacco: Never  Vaping Use   Vaping status: Never Used  Substance and Sexual Activity   Alcohol use: Yes    Alcohol/week: 1.0 standard drink of alcohol    Types: 1 Glasses of wine per week    Comment: 1 glass wine q few weeks   Drug use: No   Sexual activity: Yes  Other Topics Concern   Not on file  Social History Narrative   Pt lives in [place] with husband [name].  Followed by Dr. Jennelle Human for psychiatry and Rockne Menghini for therapy.   Right handed   Drinks caffeine   One story home   Married lives with husband   retired   International aid/development worker of Corporate investment banker Strain: Low Risk  (05/11/2022)   Overall Financial Resource Strain (CARDIA)    Difficulty of Paying Living Expenses: Not hard at all  Food Insecurity: No Food Insecurity (05/11/2022)   Hunger Vital Sign    Worried About Running Out of Food in the Last Year: Never true    Ran Out of Food in the Last Year: Never true  Transportation Needs: No Transportation Needs (05/11/2022)   PRAPARE - Administrator, Civil Service (Medical): No    Lack of Transportation (Non-Medical): No  Physical Activity: Inactive (05/11/2022)   Exercise Vital Sign    Days of Exercise per Week: 0 days    Minutes of Exercise per Session: 0 min  Stress: No Stress  Concern Present (05/09/2021)   [employer] of Occupational Health - Occupational Stress Questionnaire    Feeling of Stress : Only a little  Social Connections: Moderately Integrated (05/11/2022)   Social Connection and Isolation Panel [NHANES]    Frequency of Communication with Friends and Family: More than three times a week    Frequency of Social Gatherings with Friends and Family: More than  three times a week    Attends Religious Services: More than 4 times per year    Active Member of Clubs or Organizations: No    Attends Banker Meetings: Never    Marital Status: Married  Catering manager Violence: Unknown (12/10/2021)   Received from [facility]   HITS    Physically Hurt: Not on file    Insult or Talk Down To: Not on file    Threaten Physical Harm: Not on file    Scream or Curse: Not on file    Past Medical History, Surgical history, Social history, and Family history were reviewed and updated as appropriate.   Please see review of systems for further details on the patient's review from today.   Objective:   Physical Exam:  There were no vitals taken for this visit.  Physical Exam Neurological:     Mental Status: She is alert and oriented to person, place, and time.     Cranial Nerves: No dysarthria.     Gait: Gait normal.     Comments: Lip licking consistent, no worse Good gait  Psychiatric:        Attention and Perception: Attention and perception normal.        Mood and Affect: Mood is anxious and depressed.        Speech: Speech normal.        Behavior: Behavior is cooperative.        Thought Content: Thought content normal. Thought content is not paranoid or delusional. Thought content does not include homicidal or suicidal ideation. Thought content does not include suicidal plan.        Cognition and Memory: Cognition and memory normal.        Judgment: Judgment normal.     Comments: Insight intact Ongoing residual dep, irritability, anxiety  with anxiety worst     Lab Review:     Component Value Date/Time   NA 142 03/22/2023 0916   K 3.9 03/22/2023 0916   CL 103 03/22/2023 0916   CO2 23 03/22/2023 0916   GLUCOSE 93 03/22/2023 0916   GLUCOSE 112 (H) 05/25/2022 1319   BUN 12 03/22/2023 0916   CREATININE 1.04 (H) 03/22/2023 0916   CALCIUM 9.9 03/22/2023 0916   PROT 7.2 03/22/2023 0916   ALBUMIN 4.5 03/22/2023 0916   AST 27 03/22/2023 0916   ALT 14 03/22/2023 0916   ALKPHOS 68 03/22/2023 0916   BILITOT 0.7 03/22/2023 0916   GFRNONAA >60 05/25/2022 1319   GFRAA 96 03/02/2020 1433       Component Value Date/Time   WBC 8.8 03/22/2023 0916   WBC 7.4 01/19/2020 1158   RBC 5.14 03/22/2023 0916   RBC 4.19 01/19/2020 1158   HGB 14.8 03/22/2023 0916   HCT 47.3 (H) 03/22/2023 0916   PLT 225 03/22/2023 0916   MCV 92 03/22/2023 0916   MCH 28.8 03/22/2023 0916   MCH 30.3 01/19/2020 1158   MCHC 31.3 (L) 03/22/2023 0916   MCHC 32.1 01/19/2020 1158   RDW 13.8 03/22/2023 0916   LYMPHSABS 3.4 (H) 03/22/2023 0916   MONOABS 0.7 04/04/2019 1004   EOSABS 0.2 03/22/2023 0916   BASOSABS 0.0 03/22/2023 0916  Vitamin D level 33 on 10K units daily on 12/4/`9 Increased to prescription vitamin d 50K units Monday, Wed, Friday.  Rx sent in.   Lithium Lvl  Date Value Ref Range Status  10/21/2018 0.18 (L) 0.60 - 1.20 mmol/L Final    Comment:  Performed at [facility], [address] [place], [place] [address]     No results found for: "PHENYTOIN", "PHENOBARB", "VALPROATE", "CBMZ"   .res Assessment: Plan:    Bipolar disorder with moderate depression (HCC) - Plan: Vilazodone HCl (VIIBRYD) 40 MG TABS  Generalized anxiety disorder - Plan: Vilazodone HCl (VIIBRYD) 40 MG TABS  Attention deficit hyperactivity disorder (ADHD), predominantly inattentive type  Insomnia due to mental condition  Tardive dyskinesia  Mild cognitive impairment  Tremor of both hands  Long term current use of clozapine  Low vitamin D  level  Bipolar affective disorder, depressed, severe (HCC) - Plan: dexmethylphenidate (FOCALIN) 10 MG tablet  30 min face to face time with patient was spent on counseling and coordination of care. We discussed multiple dxes and concerns.   Marijana has chronic rapid cycling bipolar disorder which is chronically unstable and has been difficult to control.  Failed 14 different mood stabilizers.  The rapid cycling is making it difficult to control frequency of depressive episodes and the anxiety as well.  We have typically had to make frequent med changes.   Disc gradual increase bc med sensitivity.  Med sensitivity to SE seems to be the biggest problem preventing better mood control  Disc options for better control of dep, irritability and anxiety.  Incr Viibryd vs clozapine  Ingrezza for TD helped stop tongue chewing but still some mouth movements at 80 mg, which was started 05/23/22.  Shuffling only at night and drooling resolved  Split ingrezza but increase bc TD worse lately to 60 mg BID Consider Austedo.  Disc ECT. Only FDA approved option left. She wants to defer.    Extensive discussion of clozapine dosing recommendations but we will increase more slowly bc she is so med sensitive.Disc risk low WBC, cardiomyopathy, etc, sedation  (Started clozapine on 12/07/21).   Having SE drooling and mild sedation.  Eventually sedation likely to get better. Check CBC with diff every 4 weeks.  now For anxiety and irritability, reactivity incr clozapine to 100 mg HS Likely to work better than incr Viibryd but consider latter.  Prone to UTI and may be causing confusion discussed.  Had confirmed UTI recently.  She has a high residual anxiety.  It has been impossible to control all of her symptoms simultaneously without causing side effects. Failed various meds.  Discussed side effects of each medicine. Continue   Focalin IR 15 mg BID DT Cost and off label for depression  Try to spread this out if  possible for mood.  She feels this is necessary  .  Can't go higher DT already at/above usual max.   Discussed potential benefits, risks, and side effects of stimulants with patient to include increased heart rate, palpitations, insomnia, increased anxiety, increased irritability, or decreased appetite.  Instructed patient to contact office if experiencing any significant tolerability issues.  Would like to avoid BZ if possible.  Lamotrigine 100 mg twice daily to try to reduce polypharmacy and so improve tolerability of clozapine.  consider reduction if clozapine helps.  Failed all reasonable alternatives for anxiety.  Option viibryd, Auvelity  Discussed potential metabolic side effects associated with atypical antipsychotics, as well as potential risk for movement side effects. Advised pt to contact office if movement side effects occur.    Checked B12 folate bc memory complaints.  Normal B12 & folate on 05/25/22  Disc SE meds and this is heightened by the complication of necessary polypharmacy.  Supportive  and cognitive behavioral therapy in terms of  dealing with husband's addiction and now new dx metastatic prostate CA..  breaking down tasks into manageable parts.  Grief.   Rec exercise as it would help.  Take advantage of family support  Increase Viibryd 30-then 40 mg daily for anxiety and depression. Take with food.   and usually with dinner.  Requires frequent FU DT chronic instability.  Wants to schedule monthly.  FU 4 weeks.   Meredith Staggers MD, DFAPA  Please see After Visit Summary for patient specific instructions.    Future Appointments  Date Time Provider Department Center  05/10/2023 10:00 AM Mathis Fare, LCSW [facility] None  05/10/2023  3:15 PM Emeline Gins B, PTA [facility] None  05/12/2023 10:30 AM Benjie Karvonen April Ma L, PT [facility] None  05/15/2023 10:30 AM Benjie Karvonen April Ma L, PT [facility] None  05/19/2023 10:30 AM Benjie Karvonen April Ma L, PT [facility] None  05/22/2023  11:45 AM Nonato, Gellen April Ma L, PT [facility] None  05/24/2023 11:00 AM Bella Kennedy, PT [facility] None  05/29/2023 10:00 AM Mathis Fare, LCSW [facility] None  05/30/2023 11:00 AM Lurena Nida, PTA [facility] None  06/01/2023 10:15 AM Benjie Karvonen April Ma L, PT [facility] None  06/05/2023 10:15 AM Benjie Karvonen April Ma L, PT [facility] None  06/07/2023 10:15 AM Benjie Karvonen April Ma L, PT [facility] None  06/08/2023 10:30 AM Cottle, Steva Ready., MD [facility] None  06/12/2023 10:00 AM Mathis Fare, LCSW [facility] None  06/14/2023 10:15 AM Bella Kennedy, PT [facility] None  06/16/2023 10:15 AM Bella Kennedy, PT [facility] None  06/26/2023 10:00 AM Mathis Fare, LCSW [facility] None  07/10/2023 10:00 AM Cottle, Steva Ready., MD [facility] None  07/11/2023 10:00 AM Mathis Fare, LCSW [facility] None  07/24/2023 10:00 AM Mathis Fare, LCSW [facility] None  08/07/2023 11:00 AM Cottle, Steva Ready., MD [facility] None  09/27/2023  1:00 PM Anabel Halon, MD [facility]    No orders of the defined types were placed in this encounter.      -------------------------------

## 2023-05-09 NOTE — Patient Instructions (Signed)
Increase vilazodone to 1 and 1/2 of the 20 mg tablets until they are gone. Then increase to 1 of the 40 mg tablets.

## 2023-05-10 ENCOUNTER — Ambulatory Visit (HOSPITAL_COMMUNITY): Payer: No Typology Code available for payment source | Attending: Internal Medicine | Admitting: Physical Therapy

## 2023-05-10 ENCOUNTER — Ambulatory Visit: Payer: No Typology Code available for payment source | Admitting: Psychiatry

## 2023-05-10 DIAGNOSIS — M6281 Muscle weakness (generalized): Secondary | ICD-10-CM | POA: Insufficient documentation

## 2023-05-10 DIAGNOSIS — F3132 Bipolar disorder, current episode depressed, moderate: Secondary | ICD-10-CM | POA: Diagnosis not present

## 2023-05-10 DIAGNOSIS — M545 Low back pain, unspecified: Secondary | ICD-10-CM | POA: Insufficient documentation

## 2023-05-10 NOTE — Therapy (Signed)
OUTPATIENT PHYSICAL THERAPY THORACOLUMBAR Treatment   Patient Name: Megan Whitney MRN: [id number] DOB:[birth year], 67 y.o., female Today's Date: 05/10/2023  END OF SESSION:  PT End of Session - 05/10/23 1600    Visit Number 2    Number of Visits 12    Date for PT Re-Evaluation 06/15/23    Authorization Type devoted    Progress Note Due on Visit 10    PT Start Time 1520    PT Stop Time 1600    PT Time Calculation (min) 40 min    Activity Tolerance Patient tolerated treatment well    Behavior During Therapy [facility] for tasks assessed/performed             Past Medical History:  Diagnosis Date   ADD (attention deficit disorder)    Allergy    Seasonal   Anemia    History of GI blood loss   Anxiety    Arthritis    Atrophy of vagina 10/07/2020   Bipolar 1 disorder (HCC)    Cancer (HCC)    Colon polyps    Depression    Diabetes mellitus (HCC)    pt denies   Edema, lower extremity    Epistaxis    Around 2011 or 2012, required cauterization.    Esophageal stricture    Fracture of superior pubic ramus (HCC) 11/28/2018   GERD (gastroesophageal reflux disease)    Headache(784.0)    Hyperlipidemia    Interstitial cystitis    Joint pain    Lactose intolerance    Lung cancer (HCC) 2002   Neuromuscular disorder (HCC)    Obesity    Osteoarthritis    Palpitations    Sleep apnea    Doesn't use a CPAP   Suicidal ideation 01/20/2020   Swallowing difficulty    Tardive dyskinesia    Past Surgical History:  Procedure Laterality Date   BALLOON DILATION  05/16/2012   Procedure: BALLOON DILATION;  Surgeon: Louis Meckel, MD;  Location: [facility] ENDOSCOPY;  Service: Endoscopy;  Laterality: N/A;   BUNIONECTOMY  2011   COLONOSCOPY     ENTEROSCOPY  05/16/2012   Procedure: ENTEROSCOPY;  Surgeon: Louis Meckel, MD;  Location: [facility] ENDOSCOPY;  Service: Endoscopy;  Laterality: N/A;   JOINT REPLACEMENT     right shoulder durgery 25 yrs ago  1988   TOTAL HIP ARTHROPLASTY Bilateral  2006, 2008   bilateral   TUBAL LIGATION  1990   WEDGE RESECTION  2002   lung cancer   Patient Active Problem List   Diagnosis Date Noted   DDD (degenerative disc disease), lumbar 03/27/2023   Class 1 obesity due to excess calories without serious comorbidity in adult 11/29/2022   Chronic thoracic back pain 09/22/2022   Neuroleptic-induced tardive dyskinesia 07/20/2022   Intertrigo 05/19/2022   Encounter for general adult medical examination with abnormal findings 03/22/2022   Arthritis of knee 08/17/2021   Polyp of colon 02/04/2021   Seasonal allergies 10/07/2020   Osteopenia 10/07/2020   Gait disturbance 10/07/2020   Hyperlipidemia 06/11/2020   Sleep apnea 06/11/2020   Stricture and stenosis of esophagus 05/16/2012   Hiatal hernia 05/16/2012   Dysphagia, unspecified(787.20) 05/15/2012   Depression    Bipolar disorder (HCC)    GERD (gastroesophageal reflux disease) 09/14/2010   Personal history of colonic polyps 09/14/2010    PCP: Trena Platt  REFERRING PROVIDER: Anabel Halon, MD  REFERRING DIAG: M51.36 (ICD-10-CM) - DDD (degenerative disc disease), lumbar  Rationale for Evaluation and Treatment: Rehabilitation  THERAPY DIAG:  Lumbar pain  Muscle weakness (generalized)  ONSET DATE: chronic with acute exacerbation last month.    SUBJECTIVE:                                                                                                                                                                                           SUBJECTIVE STATEMENT:  PT states that she has been doing her exercises and feels like she is making some progress.   Eval:   Pt states that she is aware that she tends to sit and list to the left side but is unable to correct this.   Pt states that her pain is worse in the afternoon.  By late afternoon she can not be up longer than five minutes.  The pain is achy and is mainly on the right side of her back, the more she does the worse it hurts  and it will radiate to the side.    PERTINENT HISTORY:   67 y.o. female with past medical history of bipolar disorder, ADD, anxiety, OSA, GERD, HLD and osteopenia and lumbar pain   PAIN:  Are you having pain? Yes: NPRS scale: 1/10, worst is an 9/10; best 0/10 in the mornings.  Pain location: Rt mid and lower back  Pain description: achy Aggravating factors: weight bearing Relieving factors: sitting , heating pad   PRECAUTIONS: None  RED FLAGS: None   WEIGHT BEARING RESTRICTIONS: No  FALLS:  Has patient fallen in last 6 months? No  LIVING ENVIRONMENT: Lives with: lives with their spouse Stairs: No Has following equipment at home: None  OCCUPATION: retired   PLOF: Independent with community mobility without device  PATIENT GOALS: less pain   NEXT MD VISIT: 6 months   OBJECTIVE:    PATIENT SURVEYS:  FOTO 43  SENSATION: WFL  POSTURE: rounded shoulders, decreased lumbar lordosis, increased thoracic kyphosis, and weight shift left    LUMBAR ROM:   AROM eval  Flexion Flexed at 10 degrees  Extension Able to go to neutral   Right lateral flexion   Left lateral flexion   Right rotation   Left rotation    (Blank rows = not tested)  *did not test forward flexion due to osteopenia. LOWER EXTREMITY MMT:    MMT Right eval Left eval  Hip flexion 5/5 4/5  Hip extension 4/5 4/5  Hip abduction 5/5 3+/5   Hip adduction    Hip internal rotation    Hip external rotation    Knee flexion 4/5 5/5  Knee extension 5/5 5/5  Ankle dorsiflexion 4/5 5/5  Ankle plantarflexion    Ankle inversion  Ankle eversion     (Blank rows = not tested)  FUNCTIONAL TESTS:  30 seconds chair stand test: 6 in 30 seconds (8 is poor for pt age and sex)  2 minute walk test: 394 feet pt leaning to the left while walking  Single leg stance:  Lt: 12, RT 0   TODAY'S TREATMENT:                                                                                                                               DATE: 05/10/23 Good body mechanics for bed mobility Decompression exercises 1-5 Theraband decompression exercises to include: The overhead, the side pull, Sash and arm rotation  x 10 each  Bridge x 10  Abdominal set x 10  Standing :  wall arch  Sitting: combine good posture with cervical and scapular retraction x 10       05/04/23   Correct Seated Posture   10 reps - 5" hold - Seated Cervical Retraction   10 reps - 5" hold - Seated Scapular Retraction   10 reps - 5" hold - Seated Transversus Abdominis Bracing 10 x 5"   PATIENT EDUCATION:  Education details: HEP Person educated: Patient Education method: Explanation, Verbal cues, and Handouts Education comprehension: verbalized understanding and returned demonstration  HOME EXERCISE PROGRAM: Access Code: [email or web address] URL: [email or web address] Date: 05/04/2023 Prepared by: Virgina Organ  Exercises - Correct Seated Posture  - 4 x daily - 7 x weekly - 1 sets - 10 reps - 5" hold - Seated Cervical Retraction  - 3 x daily - 7 x weekly - 1 sets - 10 reps - 5" hold - Seated Scapular Retraction  - 3 x daily - 7 x weekly - 1 sets - 10 reps - 5" hold - Seated Transversus Abdominis Bracing  - 3 x daily - 7 x weekly - 10 reps - 5" hold  ASSESSMENT:  CLINICAL IMPRESSION:  Evaluation and goals reviewed with pt.  Pt educated in bed mobility body mechanics. Pt needed multiple cuing to complete correctly.  Added decompression exercises to HEP.  PT continues to demonstrates posture dysfunction, decreased strength in core and LE mm, decreased balance, decreased ROM, decreased activity tolerance and increased pain.  Ms. Felgar will benefit from skilled PT to address these issues and maximize her functional ability.  OBJECTIVE IMPAIRMENTS: Abnormal gait, decreased activity tolerance, decreased balance, decreased mobility, difficulty walking, decreased ROM, decreased strength, increased fascial restrictions, postural  dysfunction, and pain.   ACTIVITY LIMITATIONS: carrying, lifting, standing, squatting, stairs, and locomotion level  PARTICIPATION LIMITATIONS: meal prep, cleaning, laundry, shopping, and community activity  PERSONAL FACTORS: Time since onset of injury/illness/exacerbation and 1-2 comorbidities: OA and osteopenia  are also affecting patient's functional outcome.   REHAB POTENTIAL: Good  CLINICAL DECISION MAKING: Evolving/moderate complexity  EVALUATION COMPLEXITY: Moderate   GOALS: Goals reviewed with patient? No  SHORT TERM GOALS: Target date: 05/25/23  Pt to be I in HEP in  order to decrease her pain to no greater than a 6/10 Baseline: Goal status: on-going  2.  Pt to improve her core strength to be able to stand in an upright position  Baseline:  Goal status: on-going  3.  PT to be able to single leg stance for 10 seconds on her RT LE to decrease risk of falling  Baseline:  Goal status: on-going   LONG TERM GOALS: Target date: 06/15/23  Pt to be I in an advanced HEP in order to decrease her pain to no greater than a 3/10 Baseline:  Goal status: on-going 2.  Pt to improve her core strength to be able to stand/sit without listing to the left for 30 minutes. Baseline:  Goal status: on-going  3.  PT to be able to single leg stance for 20 seconds on her RT LE to decrease risk of falling Baseline:  Goal status: on-going   PLAN:  PT FREQUENCY: 2x/week  PT DURATION: 6 weeks  PLANNED INTERVENTIONS: Therapeutic exercises, Therapeutic activity, Balance training, Gait training, Patient/Family education, Self Care, and Manual therapy.  PLAN FOR NEXT SESSION:   Progress  stab exercises non weight bearing until pt can sit erect then add sitting. Once pt can stand with equal wt begin standing stab exercises.   Virgina Organ, PT CLT [phone]  05/10/2023, 4:01 PM

## 2023-05-10 NOTE — Progress Notes (Signed)
[facility] Counselor/Therapist Progress Note  Patient ID: Megan Whitney, MRN: [id number],    Date: 05/10/2023  Time Spent: 55 minutes   Treatment Type: Individual Therapy  Reported Symptoms: depression, anxiety, racing thoughts, anticipatory grief, some obsessive thoughts, anger that husband still abuses alcohol, denies any SI  Mental Status Exam:  Appearance:   Casual     Behavior:  Appropriate, Sharing, and Motivated  Motor:  Normal  Speech/Language:   Clear and Coherent  Affect:  Depressed and anxious  Mood:  anxious and depressed  Thought process:  goal directed  Thought content:    Obsessive thoughts  Sensory/Perceptual disturbances:    WNL  Orientation:  oriented to person, place, time/date, situation, day of week, month of year, year, and stated date of Sept. 4, 2024  Attention:  Fair  Concentration:  Fair  Memory:  WNL  Fund of knowledge:   Good  Insight:    Good and Fair  Judgment:   Good and Fair  Impulse Control:  Fair   Risk Assessment: Danger to Self:  No Self-injurious Behavior: No Danger to Others: No Duty to Warn:no Physical Aggression / Violence:No  Access to Firearms a concern: No  Gang Involvement:No   Subjective:  Patient in session today and reporting anxiety, depression, racing thoughts, anger, anticipatory grief, and frustrated with husband's continued alcohol abuse. Denies any SI. Feeling overwhelmed organizing financial records and bills, having to "chase down multiple prescriptions at multiple pharmacies, but pleased that husband gets financial help.Focusing more today on her aloneness in the midst of her husband's cancer, and some increased fearfulness and anger that she needed session today to share and work through. Some increased insight and did well in separating out her anger and frustration with husband, and her fears and anxieties about his cancer.Good support from friends.  Review of treatment goals.  Motivation improving.  Showing  increased strength, although not easy.  Interventions: Cognitive Behavioral Therapy and Ego-Supportive  Long Term Goal: Reduce overall level, frequency, and intensity of the anxiety so that daily functioning is not impaired. Short Term Goal: 1.Increase understanding of the beliefs and messages that produce the worry and anxiety. Strategies: 1.Help client develop reality-based positive cognitive messages/self-talk. 2. Develop a "coping card" or other reminder which coping strategies are recorded for patient's later use  Diagnosis:   ICD-10-CM   1. Bipolar disorder with moderate depression (HCC)  F31.32      Plan: Patient today working further on her anxiety, to suppository grief, depression, obsessive thoughts, and motivation.  Continues to show more efforts in therapy. Patient is making progress and needs to continue working with goal-directed behaviors to keep moving and a more hopeful and positive direction.  She does continue to bounce between feeling more strength at times and at other times doubting herself more and we worked in session today some on how to better manage this. Reminded and encouraged patient to practice more positive and self affirming behaviors as noted in session including: Continue letting friends be of support as this is proven to be helpful to her over time, staying focused on making good decisions versus impulsive decisions, believing more in herself and her ability to manage challenging circumstances, refrain from self sabotage in her goals, remain in contact with supportive family and friends, use of journaling between sessions which has proven to be helpful for her, setting limits with people who are not supportive, refrain from assuming worst-case scenarios, look for the strengths and the positives  within herself, spending time with her 2 dogs which is very therapeutic for her, positive self talk, and realize the strength she shows when working with goal-directed  behaviors to move in a direction that supports her improved emotional health and her outlook into the future.  Goal review and progress/challenges noted with patient.  Next appointment within 2 to 3 weeks.   Mathis Fare, LCSW

## 2023-05-12 ENCOUNTER — Ambulatory Visit (HOSPITAL_COMMUNITY): Payer: No Typology Code available for payment source | Admitting: Physical Therapy

## 2023-05-12 ENCOUNTER — Encounter (HOSPITAL_COMMUNITY): Payer: Self-pay | Admitting: Physical Therapy

## 2023-05-12 DIAGNOSIS — M545 Low back pain, unspecified: Secondary | ICD-10-CM | POA: Diagnosis not present

## 2023-05-12 DIAGNOSIS — M6281 Muscle weakness (generalized): Secondary | ICD-10-CM

## 2023-05-12 NOTE — Therapy (Signed)
OUTPATIENT PHYSICAL THERAPY THORACOLUMBAR Treatment   Patient Name: Megan Whitney MRN: [id number] DOB:Nov 26, 1955, 67 y.o., female Today's Date: 05/12/2023  END OF SESSION:  PT End of Session - 05/12/23 1034     Visit Number 3    Number of Visits 12    Date for PT Re-Evaluation 06/15/23    Authorization Type devoted    Progress Note Due on Visit 10    PT Start Time 1034    PT Stop Time 1115    PT Time Calculation (min) 41 min    Activity Tolerance Patient tolerated treatment well    Behavior During Therapy [facility] for tasks assessed/performed              Past Medical History:  Diagnosis Date   ADD (attention deficit disorder)    Allergy    Seasonal   Anemia    History of GI blood loss   Anxiety    Arthritis    Atrophy of vagina 10/07/2020   Bipolar 1 disorder (HCC)    Cancer (HCC)    Colon polyps    Depression    Diabetes mellitus (HCC)    pt denies   Edema, lower extremity    Epistaxis    Around 2011 or 2012, required cauterization.    Esophageal stricture    Fracture of superior pubic ramus (HCC) 11/28/2018   GERD (gastroesophageal reflux disease)    Headache(784.0)    Hyperlipidemia    Interstitial cystitis    Joint pain    Lactose intolerance    Lung cancer (HCC) 2002   Neuromuscular disorder (HCC)    Obesity    Osteoarthritis    Palpitations    Sleep apnea    Doesn't use a CPAP   Suicidal ideation 01/20/2020   Swallowing difficulty    Tardive dyskinesia    Past Surgical History:  Procedure Laterality Date   BALLOON DILATION  05/16/2012   Procedure: BALLOON DILATION;  Surgeon: Louis Meckel, MD;  Location: [facility] ENDOSCOPY;  Service: Endoscopy;  Laterality: N/A;   BUNIONECTOMY  2011   COLONOSCOPY     ENTEROSCOPY  05/16/2012   Procedure: ENTEROSCOPY;  Surgeon: Louis Meckel, MD;  Location: [facility] ENDOSCOPY;  Service: Endoscopy;  Laterality: N/A;   JOINT REPLACEMENT     right shoulder durgery 25 yrs ago  1988   TOTAL HIP ARTHROPLASTY Bilateral  2006, 2008   bilateral   TUBAL LIGATION  1990   WEDGE RESECTION  2002   lung cancer   Patient Active Problem List   Diagnosis Date Noted   DDD (degenerative disc disease), lumbar 03/27/2023   Class 1 obesity due to excess calories without serious comorbidity in adult 11/29/2022   Chronic thoracic back pain 09/22/2022   Neuroleptic-induced tardive dyskinesia 07/20/2022   Intertrigo 05/19/2022   Encounter for general adult medical examination with abnormal findings 03/22/2022   Arthritis of knee 08/17/2021   Polyp of colon 02/04/2021   Seasonal allergies 10/07/2020   Osteopenia 10/07/2020   Gait disturbance 10/07/2020   Hyperlipidemia 06/11/2020   Sleep apnea 06/11/2020   Stricture and stenosis of esophagus 05/16/2012   Hiatal hernia 05/16/2012   Dysphagia, unspecified(787.20) 05/15/2012   Depression    Bipolar disorder (HCC)    GERD (gastroesophageal reflux disease) 09/14/2010   Personal history of colonic polyps 09/14/2010    PCP: Trena Platt  REFERRING PROVIDER: Anabel Halon, MD  REFERRING DIAG: M51.36 (ICD-10-CM) - DDD (degenerative disc disease), lumbar  Rationale for Evaluation and Treatment:  Rehabilitation  THERAPY DIAG:  Lumbar pain  Muscle weakness (generalized)  ONSET DATE: chronic with acute exacerbation last month.    SUBJECTIVE:                                                                                                                                                                                           SUBJECTIVE STATEMENT:  Pt states she was able to practice it one time but was able to do them all. No pain this morning bu  Eval:   Pt states that she is aware that she tends to sit and list to the left side but is unable to correct this.   Pt states that her pain is worse in the afternoon.  By late afternoon she can not be up longer than five minutes.  The pain is achy and is mainly on the right side of her back, the more she does the worse it  hurts and it will radiate to the side.    PERTINENT HISTORY:   67 y.o. female with past medical history of bipolar disorder, ADD, anxiety, OSA, GERD, HLD and osteopenia and lumbar pain   PAIN:  Are you having pain? Yes: NPRS scale: 1/10, worst is an 9/10; best 0/10 in the mornings.  Pain location: Rt mid and lower back  Pain description: achy Aggravating factors: weight bearing Relieving factors: sitting , heating pad   PRECAUTIONS: None  RED FLAGS: None   WEIGHT BEARING RESTRICTIONS: No  FALLS:  Has patient fallen in last 6 months? No  LIVING ENVIRONMENT: Lives with: lives with their spouse Stairs: No Has following equipment at home: None  OCCUPATION: retired   PLOF: Independent with community mobility without device  PATIENT GOALS: less pain   NEXT MD VISIT: 6 months   OBJECTIVE:    PATIENT SURVEYS:  FOTO 43  SENSATION: WFL  POSTURE: rounded shoulders, decreased lumbar lordosis, increased thoracic kyphosis, and weight shift left   LUMBAR ROM:   AROM eval  Flexion Flexed at 10 degrees  Extension Able to go to neutral   Right lateral flexion   Left lateral flexion   Right rotation   Left rotation    (Blank rows = not tested)  *did not test forward flexion due to osteopenia. LOWER EXTREMITY MMT:    MMT Right eval Left eval  Hip flexion 5/5 4/5  Hip extension 4/5 4/5  Hip abduction 5/5 3+/5   Hip adduction    Hip internal rotation    Hip external rotation    Knee flexion 4/5 5/5  Knee extension 5/5 5/5  Ankle dorsiflexion 4/5 5/5  Ankle plantarflexion  Ankle inversion    Ankle eversion     (Blank rows = not tested)  FUNCTIONAL TESTS:  30 seconds chair stand test: 6 in 30 seconds (8 is poor for pt age and sex)  2 minute walk test: 394 feet pt leaning to the left while walking  Single leg stance:  Lt: 12, RT 0   TODAY'S TREATMENT:                                                                                                                               DATE: 05/12/23 Seated  Scapular retraction 10x5"  Cervical retraction 10x5"  Transversus abdominis bracing 10x5" Supine  Shoulder horizontal abd red TB 2x10  Side pull red TB 2x10  Sash red TB 2x10  Arm rotation red TB 2x10  TSA + diaphragmatic breaths x10  Bridge 2x10  05/10/23 Good body mechanics for bed mobility Decompression exercises 1-5 Theraband decompression exercises to include: The overhead, the side pull, Sash and arm rotation  x 10 each  Bridge x 10  Abdominal set x 10  Standing :  wall arch  Sitting: combine good posture with cervical and scapular retraction x 10    05/04/23   Correct Seated Posture   10 reps - 5" hold - Seated Cervical Retraction   10 reps - 5" hold - Seated Scapular Retraction   10 reps - 5" hold - Seated Transversus Abdominis Bracing 10 x 5"   PATIENT EDUCATION:  Education details: HEP Person educated: Patient Education method: Explanation, Verbal cues, and Handouts Education comprehension: verbalized understanding and returned demonstration  HOME EXERCISE PROGRAM: Access Code: [email or web address] URL: [email or web address] Date: 05/04/2023 Prepared by: Virgina Organ  Exercises - Correct Seated Posture  - 4 x daily - 7 x weekly - 1 sets - 10 reps - 5" hold - Seated Cervical Retraction  - 3 x daily - 7 x weekly - 1 sets - 10 reps - 5" hold - Seated Scapular Retraction  - 3 x daily - 7 x weekly - 1 sets - 10 reps - 5" hold - Seated Transversus Abdominis Bracing  - 3 x daily - 7 x weekly - 10 reps - 5" hold  ASSESSMENT:  CLINICAL IMPRESSION:  Reviewed HEP as pt had questions about her decompression exercises. Continued working on paraspinal strengthening and core strengthening with good pt tolerance. Incorporated diaphragmatic breathing for further improvements in trunk stability.  OBJECTIVE IMPAIRMENTS: Abnormal gait, decreased activity tolerance, decreased balance, decreased mobility, difficulty walking, decreased  ROM, decreased strength, increased fascial restrictions, postural dysfunction, and pain.   ACTIVITY LIMITATIONS: carrying, lifting, standing, squatting, stairs, and locomotion level  PARTICIPATION LIMITATIONS: meal prep, cleaning, laundry, shopping, and community activity  PERSONAL FACTORS: Time since onset of injury/illness/exacerbation and 1-2 comorbidities: OA and osteopenia  are also affecting patient's functional outcome.   REHAB POTENTIAL: Good  CLINICAL DECISION MAKING: Evolving/moderate complexity  EVALUATION COMPLEXITY: Moderate   GOALS: Goals reviewed with patient? No  SHORT TERM GOALS: Target date: 05/25/23  Pt to be I in HEP in order to decrease her pain to no greater than a 6/10 Baseline: Goal status: on-going  2.  Pt to improve her core strength to be able to stand in an upright position  Baseline:  Goal status: on-going  3.  PT to be able to single leg stance for 10 seconds on her RT LE to decrease risk of falling  Baseline:  Goal status: on-going   LONG TERM GOALS: Target date: 06/15/23  Pt to be I in an advanced HEP in order to decrease her pain to no greater than a 3/10 Baseline:  Goal status: on-going 2.  Pt to improve her core strength to be able to stand/sit without listing to the left for 30 minutes. Baseline:  Goal status: on-going  3.  PT to be able to single leg stance for 20 seconds on her RT LE to decrease risk of falling Baseline:  Goal status: on-going   PLAN:  PT FREQUENCY: 2x/week  PT DURATION: 6 weeks  PLANNED INTERVENTIONS: Therapeutic exercises, Therapeutic activity, Balance training, Gait training, Patient/Family education, Self Care, and Manual therapy.  PLAN FOR NEXT SESSION:   Progress stab exercises non weight bearing until pt can sit erect then add sitting. Once pt can stand with equal wt begin standing stab exercises.   Laneya Gasaway April Ma L Miyeko Mahlum, PT 05/12/2023, 10:34 AM

## 2023-05-15 ENCOUNTER — Encounter (HOSPITAL_COMMUNITY): Payer: Self-pay | Admitting: Physical Therapy

## 2023-05-15 ENCOUNTER — Ambulatory Visit: Payer: No Typology Code available for payment source | Admitting: Psychiatry

## 2023-05-15 ENCOUNTER — Ambulatory Visit (HOSPITAL_COMMUNITY): Payer: No Typology Code available for payment source | Admitting: Physical Therapy

## 2023-05-15 DIAGNOSIS — M545 Low back pain, unspecified: Secondary | ICD-10-CM

## 2023-05-15 DIAGNOSIS — F3132 Bipolar disorder, current episode depressed, moderate: Secondary | ICD-10-CM | POA: Diagnosis not present

## 2023-05-15 DIAGNOSIS — M6281 Muscle weakness (generalized): Secondary | ICD-10-CM
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
## 2023-05-15 NOTE — Therapy (Signed)
OUTPATIENT PHYSICAL THERAPY THORACOLUMBAR Treatment   Patient Name: Megan Whitney MRN: [id number] DOB:1956/02/19, 67 y.o., female Today's Date: 05/15/2023  END OF SESSION:  PT End of Session - 05/15/23 1033     Visit Number 4    Number of Visits 12    Date for PT Re-Evaluation 06/15/23    Authorization Type devoted    Progress Note Due on Visit 10    PT Start Time 1033    PT Stop Time 1115    PT Time Calculation (min) 42 min    Activity Tolerance Patient tolerated treatment well    Behavior During Therapy [facility] for tasks assessed/performed               Past Medical History:  Diagnosis Date   ADD (attention deficit disorder)    Allergy    Seasonal   Anemia    History of GI blood loss   Anxiety    Arthritis    Atrophy of vagina 10/07/2020   Bipolar 1 disorder (HCC)    Cancer (HCC)    Colon polyps    Depression    Diabetes mellitus (HCC)    pt denies   Edema, lower extremity    Epistaxis    Around 2011 or 2012, required cauterization.    Esophageal stricture    Fracture of superior pubic ramus (HCC) 11/28/2018   GERD (gastroesophageal reflux disease)    Headache(784.0)    Hyperlipidemia    Interstitial cystitis    Joint pain    Lactose intolerance    Lung cancer (HCC) 2002   Neuromuscular disorder (HCC)    Obesity    Osteoarthritis    Palpitations    Sleep apnea    Doesn't use a CPAP   Suicidal ideation 01/20/2020   Swallowing difficulty    Tardive dyskinesia    Past Surgical History:  Procedure Laterality Date   BALLOON DILATION  05/16/2012   Procedure: BALLOON DILATION;  Surgeon: Louis Meckel, MD;  Location: [facility] ENDOSCOPY;  Service: Endoscopy;  Laterality: N/A;   BUNIONECTOMY  2011   COLONOSCOPY     ENTEROSCOPY  05/16/2012   Procedure: ENTEROSCOPY;  Surgeon: Louis Meckel, MD;  Location: [facility] ENDOSCOPY;  Service: Endoscopy;  Laterality: N/A;   JOINT REPLACEMENT     right shoulder durgery 25 yrs ago  1988   TOTAL HIP ARTHROPLASTY Bilateral  2006, 2008   bilateral   TUBAL LIGATION  1990   WEDGE RESECTION  2002   lung cancer   Patient Active Problem List   Diagnosis Date Noted   DDD (degenerative disc disease), lumbar 03/27/2023   Class 1 obesity due to excess calories without serious comorbidity in adult 11/29/2022   Chronic thoracic back pain 09/22/2022   Neuroleptic-induced tardive dyskinesia 07/20/2022   Intertrigo 05/19/2022   Encounter for general adult medical examination with abnormal findings 03/22/2022   Arthritis of knee 08/17/2021   Polyp of colon 02/04/2021   Seasonal allergies 10/07/2020   Osteopenia 10/07/2020   Gait disturbance 10/07/2020   Hyperlipidemia 06/11/2020   Sleep apnea 06/11/2020   Stricture and stenosis of esophagus 05/16/2012   Hiatal hernia 05/16/2012   Dysphagia, unspecified(787.20) 05/15/2012   Depression    Bipolar disorder (HCC)    GERD (gastroesophageal reflux disease) 09/14/2010   Personal history of colonic polyps 09/14/2010    PCP: Trena Platt  REFERRING PROVIDER: Anabel Halon, MD  REFERRING DIAG: M51.36 (ICD-10-CM) - DDD (degenerative disc disease), lumbar  Rationale for Evaluation and  Treatment: Rehabilitation  THERAPY DIAG:  Lumbar pain  Muscle weakness (generalized)  ONSET DATE: chronic with acute exacerbation last month.    SUBJECTIVE:                                                                                                                                                                                           SUBJECTIVE STATEMENT:  Pt states exercises went well this weekend. No pain this morning. A lot of pain yesterday afternoon and did the exercises but did help the pain.   Eval:   Pt states that she is aware that she tends to sit and list to the left side but is unable to correct this.   Pt states that her pain is worse in the afternoon.  By late afternoon she can not be up longer than five minutes.  The pain is achy and is mainly on the right  side of her back, the more she does the worse it hurts and it will radiate to the side.    PERTINENT HISTORY:   67 y.o. female with past medical history of bipolar disorder, ADD, anxiety, OSA, GERD, HLD and osteopenia and lumbar pain   PAIN:  Are you having pain? Yes: NPRS scale: 1/10, worst is an 9/10; best 0/10 in the mornings.  Pain location: Rt mid and lower back  Pain description: achy Aggravating factors: weight bearing Relieving factors: sitting , heating pad   PRECAUTIONS: None  RED FLAGS: None   WEIGHT BEARING RESTRICTIONS: No  FALLS:  Has patient fallen in last 6 months? No  LIVING ENVIRONMENT: Lives with: lives with their spouse Stairs: No Has following equipment at home: None  OCCUPATION: retired   PLOF: Independent with community mobility without device  PATIENT GOALS: less pain   NEXT MD VISIT: 6 months   OBJECTIVE:    PATIENT SURVEYS:  FOTO 43  SENSATION: WFL  POSTURE: rounded shoulders, decreased lumbar lordosis, increased thoracic kyphosis, and weight shift left   LUMBAR ROM:   AROM eval  Flexion Flexed at 10 degrees  Extension Able to go to neutral   Right lateral flexion   Left lateral flexion   Right rotation   Left rotation    (Blank rows = not tested)  *did not test forward flexion due to osteopenia. LOWER EXTREMITY MMT:    MMT Right eval Left eval  Hip flexion 5/5 4/5  Hip extension 4/5 4/5  Hip abduction 5/5 3+/5   Hip adduction    Hip internal rotation    Hip external rotation    Knee flexion 4/5 5/5  Knee extension 5/5 5/5  Ankle  dorsiflexion 4/5 5/5  Ankle plantarflexion    Ankle inversion    Ankle eversion     (Blank rows = not tested)  FUNCTIONAL TESTS:  30 seconds chair stand test: 6 in 30 seconds (8 is poor for pt age and sex)  2 minute walk test: 394 feet pt leaning to the left while walking  Single leg stance:  Lt: 12, RT 0   TODAY'S TREATMENT:                                                                                                                               DATE: 05/15/23 Seated  Scapular + cervical retraction 10x5"  Row with scap retraction 2x10 red TB  Lateral trunk flexion/stretch with pball x10  TSA bracing + pball press down 10x5"  TSA bracing + contralateral arm and leg press into pball 10x5" Supine  Shoulder horizontal abd red TB 2x10  Sash red TB 2x10  Side pull green TB 2x10  Hip hike x10 Standing  Hip hike x10 on step  L Lateral shift into wall 2x30"  Slow marching for balance 2x10 with UE support  05/12/23 Seated  Scapular retraction 10x5"  Cervical retraction 10x5"  Transversus abdominis bracing 10x5" Supine  Shoulder horizontal abd red TB 2x10  Side pull red TB 2x10  Sash red TB 2x10  Arm rotation red TB 2x10  TSA + diaphragmatic breaths x10  Bridge 2x10  05/10/23 Good body mechanics for bed mobility Decompression exercises 1-5 Theraband decompression exercises to include: The overhead, the side pull, Sash and arm rotation  x 10 each  Bridge x 10  Abdominal set x 10  Standing :  wall arch  Sitting: combine good posture with cervical and scapular retraction x 10    05/04/23   Correct Seated Posture   10 reps - 5" hold - Seated Cervical Retraction   10 reps - 5" hold - Seated Scapular Retraction   10 reps - 5" hold - Seated Transversus Abdominis Bracing 10 x 5"   PATIENT EDUCATION:  Education details: HEP Person educated: Patient Education method: Explanation, Verbal cues, and Handouts Education comprehension: verbalized understanding and returned demonstration  HOME EXERCISE PROGRAM: Access Code: [email or web address] URL: [email or web address] Date: 05/04/2023 Prepared by: Virgina Organ  Exercises - Correct Seated Posture  - 4 x daily - 7 x weekly - 1 sets - 10 reps - 5" hold - Seated Cervical Retraction  - 3 x daily - 7 x weekly - 1 sets - 10 reps - 5" hold - Seated Scapular Retraction  - 3 x daily - 7 x weekly - 1 sets - 10 reps  - 5" hold - Seated Transversus Abdominis Bracing  - 3 x daily - 7 x weekly - 10 reps - 5" hold  ASSESSMENT:  CLINICAL IMPRESSION:  Progressed seated core work today with good pt tolerance using pball for isometrics. Continued decompression exercises -- has been getting easier for pt at  home so discussed switching to green TB. Worked on improving and strengthening upright standing with less L lateral lean.   OBJECTIVE IMPAIRMENTS: Abnormal gait, decreased activity tolerance, decreased balance, decreased mobility, difficulty walking, decreased ROM, decreased strength, increased fascial restrictions, postural dysfunction, and pain.   ACTIVITY LIMITATIONS: carrying, lifting, standing, squatting, stairs, and locomotion level  PARTICIPATION LIMITATIONS: meal prep, cleaning, laundry, shopping, and community activity  PERSONAL FACTORS: Time since onset of injury/illness/exacerbation and 1-2 comorbidities: OA and osteopenia  are also affecting patient's functional outcome.   REHAB POTENTIAL: Good  CLINICAL DECISION MAKING: Evolving/moderate complexity  EVALUATION COMPLEXITY: Moderate   GOALS: Goals reviewed with patient? No  SHORT TERM GOALS: Target date: 05/25/23  Pt to be I in HEP in order to decrease her pain to no greater than a 6/10 Baseline: Goal status: on-going  2.  Pt to improve her core strength to be able to stand in an upright position  Baseline:  Goal status: on-going  3.  PT to be able to single leg stance for 10 seconds on her RT LE to decrease risk of falling  Baseline:  Goal status: on-going   LONG TERM GOALS: Target date: 06/15/23  Pt to be I in an advanced HEP in order to decrease her pain to no greater than a 3/10 Baseline:  Goal status: on-going 2.  Pt to improve her core strength to be able to stand/sit without listing to the left for 30 minutes. Baseline:  Goal status: on-going  3.  PT to be able to single leg stance for 20 seconds on her RT LE to  decrease risk of falling Baseline:  Goal status: on-going   PLAN:  PT FREQUENCY: 2x/week  PT DURATION: 6 weeks  PLANNED INTERVENTIONS: Therapeutic exercises, Therapeutic activity, Balance training, Gait training, Patient/Family education, Self Care, and Manual therapy.  PLAN FOR NEXT SESSION:   Progress stab exercises non weight bearing until pt can sit erect then add sitting. Once pt can stand with equal wt begin standing stab exercises.   Kelyn Koskela April Ma L Chardai Gangemi, PT 05/15/2023, 10:34 AM

## 2023-05-19 ENCOUNTER — Ambulatory Visit (HOSPITAL_COMMUNITY): Payer: No Typology Code available for payment source | Admitting: Physical Therapy

## 2023-05-19 ENCOUNTER — Encounter (HOSPITAL_COMMUNITY): Payer: Self-pay | Admitting: Physical Therapy

## 2023-05-19 DIAGNOSIS — M6281 Muscle weakness (generalized): Secondary | ICD-10-CM

## 2023-05-19 DIAGNOSIS — M545 Low back pain, unspecified: Secondary | ICD-10-CM

## 2023-05-19 NOTE — Therapy (Signed)
OUTPATIENT PHYSICAL THERAPY THORACOLUMBAR Treatment   Patient Name: Megan Whitney MRN: [id number] DOB:March 01, 1956, 67 y.o., female Today's Date: 05/19/2023  END OF SESSION:  PT End of Session - 05/19/23 1023     Visit Number 5    Number of Visits 12    Date for PT Re-Evaluation 06/15/23    Authorization Type devoted    Progress Note Due on Visit 10    PT Start Time 1025    PT Stop Time 1110    PT Time Calculation (min) 45 min    Activity Tolerance Patient tolerated treatment well    Behavior During Therapy [facility] for tasks assessed/performed                Past Medical History:  Diagnosis Date   ADD (attention deficit disorder)    Allergy    Seasonal   Anemia    History of GI blood loss   Anxiety    Arthritis    Atrophy of vagina 10/07/2020   Bipolar 1 disorder (HCC)    Cancer (HCC)    Colon polyps    Depression    Diabetes mellitus (HCC)    pt denies   Edema, lower extremity    Epistaxis    Around 2011 or 2012, required cauterization.    Esophageal stricture    Fracture of superior pubic ramus (HCC) 11/28/2018   GERD (gastroesophageal reflux disease)    Headache(784.0)    Hyperlipidemia    Interstitial cystitis    Joint pain    Lactose intolerance    Lung cancer (HCC) 2002   Neuromuscular disorder (HCC)    Obesity    Osteoarthritis    Palpitations    Sleep apnea    Doesn't use a CPAP   Suicidal ideation 01/20/2020   Swallowing difficulty    Tardive dyskinesia    Past Surgical History:  Procedure Laterality Date   BALLOON DILATION  05/16/2012   Procedure: BALLOON DILATION;  Surgeon: Louis Meckel, MD;  Location: [facility] ENDOSCOPY;  Service: Endoscopy;  Laterality: N/A;   BUNIONECTOMY  2011   COLONOSCOPY     ENTEROSCOPY  05/16/2012   Procedure: ENTEROSCOPY;  Surgeon: Louis Meckel, MD;  Location: [facility] ENDOSCOPY;  Service: Endoscopy;  Laterality: N/A;   JOINT REPLACEMENT     right shoulder durgery 25 yrs ago  1988   TOTAL HIP ARTHROPLASTY  Bilateral 2006, 2008   bilateral   TUBAL LIGATION  1990   WEDGE RESECTION  2002   lung cancer   Patient Active Problem List   Diagnosis Date Noted   DDD (degenerative disc disease), lumbar 03/27/2023   Class 1 obesity due to excess calories without serious comorbidity in adult 11/29/2022   Chronic thoracic back pain 09/22/2022   Neuroleptic-induced tardive dyskinesia 07/20/2022   Intertrigo 05/19/2022   Encounter for general adult medical examination with abnormal findings 03/22/2022   Arthritis of knee 08/17/2021   Polyp of colon 02/04/2021   Seasonal allergies 10/07/2020   Osteopenia 10/07/2020   Gait disturbance 10/07/2020   Hyperlipidemia 06/11/2020   Sleep apnea 06/11/2020   Stricture and stenosis of esophagus 05/16/2012   Hiatal hernia 05/16/2012   Dysphagia, unspecified(787.20) 05/15/2012   Depression    Bipolar disorder (HCC)    GERD (gastroesophageal reflux disease) 09/14/2010   Personal history of colonic polyps 09/14/2010    PCP: Trena Platt  REFERRING PROVIDER: Anabel Halon, MD  REFERRING DIAG: M51.36 (ICD-10-CM) - DDD (degenerative disc disease), lumbar  Rationale for Evaluation  and Treatment: Rehabilitation  THERAPY DIAG:  Lumbar pain  Muscle weakness (generalized)  ONSET DATE: chronic with acute exacerbation last month.    SUBJECTIVE:                                                                                                                                                                                           SUBJECTIVE STATEMENT:  Pt notes some increased pain along her R thoracic spine  Eval:   Pt states that she is aware that she tends to sit and list to the left side but is unable to correct this.   Pt states that her pain is worse in the afternoon.  By late afternoon she can not be up longer than five minutes.  The pain is achy and is mainly on the right side of her back, the more she does the worse it hurts and it will radiate to the  side.    PERTINENT HISTORY:   67 y.o. female with past medical history of bipolar disorder, ADD, anxiety, OSA, GERD, HLD and osteopenia and lumbar pain   PAIN:  Are you having pain? Yes: NPRS scale: 1/10, worst is an 9/10; best 0/10 in the mornings.  Pain location: Rt mid and lower back  Pain description: achy Aggravating factors: weight bearing Relieving factors: sitting , heating pad   PRECAUTIONS: None  RED FLAGS: None   WEIGHT BEARING RESTRICTIONS: No  FALLS:  Has patient fallen in last 6 months? No  LIVING ENVIRONMENT: Lives with: lives with their spouse Stairs: No Has following equipment at home: None  OCCUPATION: retired   PLOF: Independent with community mobility without device  PATIENT GOALS: less pain   NEXT MD VISIT: 6 months   OBJECTIVE:    PATIENT SURVEYS:  FOTO 43  SENSATION: WFL  POSTURE: rounded shoulders, decreased lumbar lordosis, increased thoracic kyphosis, and weight shift left   LUMBAR ROM:   AROM eval  Flexion Flexed at 10 degrees  Extension Able to go to neutral   Right lateral flexion   Left lateral flexion   Right rotation   Left rotation    (Blank rows = not tested)  *did not test forward flexion due to osteopenia. LOWER EXTREMITY MMT:    MMT Right eval Left eval  Hip flexion 5/5 4/5  Hip extension 4/5 4/5  Hip abduction 5/5 3+/5   Hip adduction    Hip internal rotation    Hip external rotation    Knee flexion 4/5 5/5  Knee extension 5/5 5/5  Ankle dorsiflexion 4/5 5/5  Ankle plantarflexion    Ankle inversion    Ankle eversion     (  Blank rows = not tested)  FUNCTIONAL TESTS:  30 seconds chair stand test: 6 in 30 seconds (8 is poor for pt age and sex)  2 minute walk test: 394 feet pt leaning to the left while walking  Single leg stance:  Lt: 12, RT 0   TODAY'S TREATMENT:                                                                                                                               DATE: 05/19/23 Manual therapy  STM & TPR R thoracic paraspinal/QL Quadruped  Child's pose x30 sec, with lateral flexion x30 sec R&L each  Cat/cow 5x10"  "Thread the needle" 5x5" Standing  "L" stretch with lateral flexion x30"  Row red TB 2x10, tactile cues to maintain proper spinal alignment  Shoulder ext red TB 2x10  Glute set iso into wall 10x3" Seated  TSA bracing + pball press down 10x5"  TSA bracing + contralateral arm and leg press into pball 10x5"   05/15/23 Seated  Scapular + cervical retraction 10x5"  Row with scap retraction 2x10 red TB  Lateral trunk flexion/stretch with pball x10  TSA bracing + pball press down 10x5"  TSA bracing + contralateral arm and leg press into pball 10x5" Supine  Shoulder horizontal abd red TB 2x10  Sash red TB 2x10  Side pull green TB 2x10  Hip hike x10 Standing  Hip hike x10 on step  L Lateral shift into wall 2x30"  Slow marching for balance 2x10 with UE support  05/12/23 Seated  Scapular retraction 10x5"  Cervical retraction 10x5"  Transversus abdominis bracing 10x5" Supine  Shoulder horizontal abd red TB 2x10  Side pull red TB 2x10  Sash red TB 2x10  Arm rotation red TB 2x10  TSA + diaphragmatic breaths x10  Bridge 2x10  05/10/23 Good body mechanics for bed mobility Decompression exercises 1-5 Theraband decompression exercises to include: The overhead, the side pull, Sash and arm rotation  x 10 each  Bridge x 10  Abdominal set x 10  Standing :  wall arch  Sitting: combine good posture with cervical and scapular retraction x 10    05/04/23   Correct Seated Posture   10 reps - 5" hold - Seated Cervical Retraction   10 reps - 5" hold - Seated Scapular Retraction   10 reps - 5" hold - Seated Transversus Abdominis Bracing 10 x 5"   PATIENT EDUCATION:  Education details: HEP updates. Self massage using ball to help with muscle spasm Person educated: Patient Education method: Explanation, Verbal cues, and  Handouts Education comprehension: verbalized understanding and returned demonstration  HOME EXERCISE PROGRAM: Access Code: [email or web address] URL: [email or web address] Date: 05/04/2023 Prepared by: Virgina Organ  Exercises - Correct Seated Posture  - 4 x daily - 7 x weekly - 1 sets - 10 reps - 5" hold - Seated Cervical Retraction  - 3 x daily - 7 x weekly - 1 sets -  10 reps - 5" hold - Seated Scapular Retraction  - 3 x daily - 7 x weekly - 1 sets - 10 reps - 5" hold - Seated Transversus Abdominis Bracing  - 3 x daily - 7 x weekly - 10 reps - 5" hold  ASSESSMENT:  CLINICAL IMPRESSION:  Some increased muscle spasm and tightness along R thoracic spine this morning. Improved pain and decreased muscle tension after performing manual work and stretches. Discussed self massage and stretches at home to continue to address muscle spasm/tightness. Pt to continue to work with her green TB at home for supine decompression exercises. Trying to slowly add more sitting and standing exercises for postural stability while keeping good spinal alignment. Pt still with some noted R lateral hip shift and rounded shoulders/thoracic hypomobility.   OBJECTIVE IMPAIRMENTS: Abnormal gait, decreased activity tolerance, decreased balance, decreased mobility, difficulty walking, decreased ROM, decreased strength, increased fascial restrictions, postural dysfunction, and pain.   ACTIVITY LIMITATIONS: carrying, lifting, standing, squatting, stairs, and locomotion level  PARTICIPATION LIMITATIONS: meal prep, cleaning, laundry, shopping, and community activity  PERSONAL FACTORS: Time since onset of injury/illness/exacerbation and 1-2 comorbidities: OA and osteopenia  are also affecting patient's functional outcome.   REHAB POTENTIAL: Good  CLINICAL DECISION MAKING: Evolving/moderate complexity  EVALUATION COMPLEXITY: Moderate   GOALS: Goals reviewed with patient? No  SHORT TERM GOALS: Target date:  05/25/23  Pt to be I in HEP in order to decrease her pain to no greater than a 6/10 Baseline: Goal status: on-going  2.  Pt to improve her core strength to be able to stand in an upright position  Baseline:  Goal status: on-going  3.  PT to be able to single leg stance for 10 seconds on her RT LE to decrease risk of falling  Baseline:  Goal status: on-going   LONG TERM GOALS: Target date: 06/15/23  Pt to be I in an advanced HEP in order to decrease her pain to no greater than a 3/10 Baseline:  Goal status: on-going 2.  Pt to improve her core strength to be able to stand/sit without listing to the left for 30 minutes. Baseline:  Goal status: on-going  3.  PT to be able to single leg stance for 20 seconds on her RT LE to decrease risk of falling Baseline:  Goal status: on-going   PLAN:  PT FREQUENCY: 2x/week  PT DURATION: 6 weeks  PLANNED INTERVENTIONS: Therapeutic exercises, Therapeutic activity, Balance training, Gait training, Patient/Family education, Self Care, and Manual therapy.  PLAN FOR NEXT SESSION:   Progress stab exercises non weight bearing until pt can sit erect then add sitting. Once pt can stand with equal wt begin standing stab exercises.   Salar Molden April Ma L Ousmane Seeman, PT 05/19/2023, 10:24 AM

## 2023-05-22 ENCOUNTER — Encounter (HOSPITAL_COMMUNITY): Payer: Self-pay | Admitting: Physical Therapy

## 2023-05-22 ENCOUNTER — Ambulatory Visit (HOSPITAL_COMMUNITY): Payer: No Typology Code available for payment source | Admitting: Physical Therapy

## 2023-05-22 DIAGNOSIS — M545 Low back pain, unspecified: Secondary | ICD-10-CM | POA: Diagnosis not present

## 2023-05-22 DIAGNOSIS — M6281 Muscle weakness (generalized): Secondary | ICD-10-CM

## 2023-05-22 NOTE — Therapy (Signed)
OUTPATIENT PHYSICAL THERAPY THORACOLUMBAR Treatment   Patient Name: Megan Whitney MRN: [id number] DOB:[birth year], 67 y.o., female Today's Date: 05/22/2023  END OF SESSION:  PT End of Session - 05/22/23 1150     Visit Number 6    Number of Visits 12    Date for PT Re-Evaluation 06/15/23    Authorization Type devoted    Progress Note Due on Visit 10    PT Start Time 1150    PT Stop Time 1230    PT Time Calculation (min) 40 min    Activity Tolerance Patient tolerated treatment well    Behavior During Therapy [facility] for tasks assessed/performed             Past Medical History:  Diagnosis Date   ADD (attention deficit disorder)    Allergy    Seasonal   Anemia    History of GI blood loss   Anxiety    Arthritis    Atrophy of vagina 10/07/2020   Bipolar 1 disorder (HCC)    Cancer (HCC)    Colon polyps    Depression    Diabetes mellitus (HCC)    pt denies   Edema, lower extremity    Epistaxis    Around 2011 or 2012, required cauterization.    Esophageal stricture    Fracture of superior pubic ramus (HCC) 11/28/2018   GERD (gastroesophageal reflux disease)    Headache(784.0)    Hyperlipidemia    Interstitial cystitis    Joint pain    Lactose intolerance    Lung cancer (HCC) 2002   Neuromuscular disorder (HCC)    Obesity    Osteoarthritis    Palpitations    Sleep apnea    Doesn't use a CPAP   Suicidal ideation 01/20/2020   Swallowing difficulty    Tardive dyskinesia    Past Surgical History:  Procedure Laterality Date   BALLOON DILATION  05/16/2012   Procedure: BALLOON DILATION;  Surgeon: Louis Meckel, MD;  Location: [facility] ENDOSCOPY;  Service: Endoscopy;  Laterality: N/A;   BUNIONECTOMY  2011   COLONOSCOPY     ENTEROSCOPY  05/16/2012   Procedure: ENTEROSCOPY;  Surgeon: Louis Meckel, MD;  Location: [facility] ENDOSCOPY;  Service: Endoscopy;  Laterality: N/A;   JOINT REPLACEMENT     right shoulder durgery 25 yrs ago  1988   TOTAL HIP ARTHROPLASTY Bilateral  2006, 2008   bilateral   TUBAL LIGATION  1990   WEDGE RESECTION  2002   lung cancer   Patient Active Problem List   Diagnosis Date Noted   DDD (degenerative disc disease), lumbar 03/27/2023   Class 1 obesity due to excess calories without serious comorbidity in adult 11/29/2022   Chronic thoracic back pain 09/22/2022   Neuroleptic-induced tardive dyskinesia 07/20/2022   Intertrigo 05/19/2022   Encounter for general adult medical examination with abnormal findings 03/22/2022   Arthritis of knee 08/17/2021   Polyp of colon 02/04/2021   Seasonal allergies 10/07/2020   Osteopenia 10/07/2020   Gait disturbance 10/07/2020   Hyperlipidemia 06/11/2020   Sleep apnea 06/11/2020   Stricture and stenosis of esophagus 05/16/2012   Hiatal hernia 05/16/2012   Dysphagia, unspecified(787.20) 05/15/2012   Depression    Bipolar disorder (HCC)    GERD (gastroesophageal reflux disease) 09/14/2010   Personal history of colonic polyps 09/14/2010    PCP: Trena Platt  REFERRING PROVIDER: Anabel Halon, MD  REFERRING DIAG: M51.36 (ICD-10-CM) - DDD (degenerative disc disease), lumbar  Rationale for Evaluation and Treatment: Rehabilitation  THERAPY DIAG:  Lumbar pain  Muscle weakness (generalized)  ONSET DATE: chronic with acute exacerbation last month.    SUBJECTIVE:                                                                                                                                                                                           SUBJECTIVE STATEMENT:  Pt states green band has continued to go well. Not able to do tennis ball self massage but was able to put heat on her R thoracic paraspinal and it has been less painful. Did not feel too bad after last session.   Eval:   Pt states that she is aware that she tends to sit and list to the left side but is unable to correct this.   Pt states that her pain is worse in the afternoon.  By late afternoon she can not be up longer  than five minutes.  The pain is achy and is mainly on the right side of her back, the more she does the worse it hurts and it will radiate to the side.    PERTINENT HISTORY:   67 y.o. female with past medical history of bipolar disorder, ADD, anxiety, OSA, GERD, HLD and osteopenia and lumbar pain   PAIN:  Are you having pain? Yes: NPRS scale: 1/10, worst is an 9/10; best 0/10 in the mornings.  Pain location: Rt mid and lower back  Pain description: achy Aggravating factors: weight bearing Relieving factors: sitting , heating pad   PRECAUTIONS: None  RED FLAGS: None   WEIGHT BEARING RESTRICTIONS: No  FALLS:  Has patient fallen in last 6 months? No  LIVING ENVIRONMENT: Lives with: lives with their spouse Stairs: No Has following equipment at home: None  OCCUPATION: retired   PLOF: Independent with community mobility without device  PATIENT GOALS: less pain   NEXT MD VISIT: 6 months   OBJECTIVE:    PATIENT SURVEYS:  FOTO 43  SENSATION: WFL  POSTURE: rounded shoulders, decreased lumbar lordosis, increased thoracic kyphosis, and weight shift left   LUMBAR ROM:   AROM eval  Flexion Flexed at 10 degrees  Extension Able to go to neutral   Right lateral flexion   Left lateral flexion   Right rotation   Left rotation    (Blank rows = not tested)  *did not test forward flexion due to osteopenia. LOWER EXTREMITY MMT:    MMT Right eval Left eval  Hip flexion 5/5 4/5  Hip extension 4/5 4/5  Hip abduction 5/5 3+/5   Hip adduction    Hip internal rotation    Hip external rotation  Knee flexion 4/5 5/5  Knee extension 5/5 5/5  Ankle dorsiflexion 4/5 5/5  Ankle plantarflexion    Ankle inversion    Ankle eversion     (Blank rows = not tested)  FUNCTIONAL TESTS:  30 seconds chair stand test: 6 in 30 seconds (8 is poor for pt age and sex)  2 minute walk test: 394 feet pt leaning to the left while walking  Single leg stance:  Lt: 12, RT 0   TODAY'S  TREATMENT:                                                                                                                              DATE: 05/22/23 Standing  Lateral hip shift x 30 sec  "L" stretch x 30 sec, with lateral flexion x30 sec R&L  Glute med iso set 10x3"  Cervical retraction against wall 10x3"  Row red TB 2x10  R SLS 5 sec, L SLS 10 sec Seated Thoracic extension into chair 2x30 sec Hands behind head with scapular squeeze 2x10 for thoracic mobility and scapular strengthening Shoulder horizontal abd 2x10 red TB Side pull red TB 2x10  Sash red TB 2x10  "W" red TB 2x10  05/19/23 Manual therapy  STM & TPR R thoracic paraspinal/QL Quadruped  Child's pose x30 sec, with lateral flexion x30 sec R&L each  Cat/cow 5x10"  "Thread the needle" 5x5" Standing  "L" stretch with lateral flexion x30"  Row red TB 2x10, tactile cues to maintain proper spinal alignment  Shoulder ext red TB 2x10  Glute set iso into wall 10x3" Seated  TSA bracing + pball press down 10x5"  TSA bracing + contralateral arm and leg press into pball 10x5"   05/15/23 Seated  Scapular + cervical retraction 10x5"  Row with scap retraction 2x10 red TB  Lateral trunk flexion/stretch with pball x10  TSA bracing + pball press down 10x5"  TSA bracing + contralateral arm and leg press into pball 10x5" Supine  Shoulder horizontal abd red TB 2x10  Sash red TB 2x10  Side pull green TB 2x10  Hip hike x10 Standing  Hip hike x10 on step  L Lateral shift into wall 2x30"  Slow marching for balance 2x10 with UE support  05/12/23 Seated  Scapular retraction 10x5"  Cervical retraction 10x5"  Transversus abdominis bracing 10x5" Supine  Shoulder horizontal abd red TB 2x10  Side pull red TB 2x10  Sash red TB 2x10  Arm rotation red TB 2x10  TSA + diaphragmatic breaths x10  Bridge 2x10  05/10/23 Good body mechanics for bed mobility Decompression exercises 1-5 Theraband decompression exercises to include: The  overhead, the side pull, Sash and arm rotation  x 10 each  Bridge x 10  Abdominal set x 10  Standing :  wall arch  Sitting: combine good posture with cervical and scapular retraction x 10    05/04/23   Correct Seated Posture   10 reps - 5" hold -  Seated Cervical Retraction   10 reps - 5" hold - Seated Scapular Retraction   10 reps - 5" hold - Seated Transversus Abdominis Bracing 10 x 5"   PATIENT EDUCATION:  Education details: HEP updates. Self massage using ball to help with muscle spasm Person educated: Patient Education method: Explanation, Verbal cues, and Handouts Education comprehension: verbalized understanding and returned demonstration  HOME EXERCISE PROGRAM: Access Code: [email or web address] URL: [email or web address] Date: 05/04/2023 Prepared by: Virgina Organ  Exercises - Correct Seated Posture  - 4 x daily - 7 x weekly - 1 sets - 10 reps - 5" hold - Seated Cervical Retraction  - 3 x daily - 7 x weekly - 1 sets - 10 reps - 5" hold - Seated Scapular Retraction  - 3 x daily - 7 x weekly - 1 sets - 10 reps - 5" hold - Seated Transversus Abdominis Bracing  - 3 x daily - 7 x weekly - 10 reps - 5" hold  ASSESSMENT:  CLINICAL IMPRESSION:  Worked on progressing pt to all exercises against gravity for midback and core strengthening. Tolerated well. Continued cueing to keep from listing to the left. Pt is demonstrating improving weight shift and balance on to R LE.   OBJECTIVE IMPAIRMENTS: Abnormal gait, decreased activity tolerance, decreased balance, decreased mobility, difficulty walking, decreased ROM, decreased strength, increased fascial restrictions, postural dysfunction, and pain.   ACTIVITY LIMITATIONS: carrying, lifting, standing, squatting, stairs, and locomotion level  PARTICIPATION LIMITATIONS: meal prep, cleaning, laundry, shopping, and community activity  PERSONAL FACTORS: Time since onset of injury/illness/exacerbation and 1-2 comorbidities: OA and  osteopenia  are also affecting patient's functional outcome.   REHAB POTENTIAL: Good  CLINICAL DECISION MAKING: Evolving/moderate complexity  EVALUATION COMPLEXITY: Moderate   GOALS: Goals reviewed with patient? No  SHORT TERM GOALS: Target date: 05/25/23  Pt to be I in HEP in order to decrease her pain to no greater than a 6/10 Baseline: Goal status: on-going  2.  Pt to improve her core strength to be able to stand in an upright position  Baseline:  Goal status: on-going  3.  PT to be able to single leg stance for 10 seconds on her RT LE to decrease risk of falling  Baseline:  Goal status: on-going   LONG TERM GOALS: Target date: 06/15/23  Pt to be I in an advanced HEP in order to decrease her pain to no greater than a 3/10 Baseline:  Goal status: on-going 2.  Pt to improve her core strength to be able to stand/sit without listing to the left for 30 minutes. Baseline:  Goal status: on-going  3.  PT to be able to single leg stance for 20 seconds on her RT LE to decrease risk of falling Baseline:  Goal status: on-going   PLAN:  PT FREQUENCY: 2x/week  PT DURATION: 6 weeks  PLANNED INTERVENTIONS: Therapeutic exercises, Therapeutic activity, Balance training, Gait training, Patient/Family education, Self Care, and Manual therapy.  PLAN FOR NEXT SESSION:   Progress stab exercises non weight bearing until pt can sit erect then add sitting. Once pt can stand with equal wt begin standing stab exercises.   Obelia Bonello April Ma L Isbella Arline, PT 05/22/2023, 11:51 AM

## 2023-05-24 ENCOUNTER — Ambulatory Visit (HOSPITAL_COMMUNITY)
Admission: RE | Admit: 2023-05-24 | Discharge: 2023-05-24 | Disposition: A | Discharge status: Home / Self Care | Payer: No Typology Code available for payment source | Source: Ambulatory Visit | Attending: Internal Medicine | Admitting: Internal Medicine

## 2023-05-24 ENCOUNTER — Encounter (HOSPITAL_COMMUNITY): Payer: No Typology Code available for payment source | Admitting: Physical Therapy

## 2023-05-24 DIAGNOSIS — M438X6 Other specified deforming dorsopathies, lumbar region: Secondary | ICD-10-CM | POA: Diagnosis not present

## 2023-05-24 DIAGNOSIS — M545 Low back pain, unspecified: Secondary | ICD-10-CM | POA: Diagnosis not present

## 2023-05-24 DIAGNOSIS — M5136 Other intervertebral disc degeneration, lumbar region: Secondary | ICD-10-CM | POA: Diagnosis not present

## 2023-05-24 DIAGNOSIS — M4316 Spondylolisthesis, lumbar region: Secondary | ICD-10-CM | POA: Diagnosis not present

## 2023-05-24 DIAGNOSIS — M47816 Spondylosis without myelopathy or radiculopathy, lumbar region: Secondary | ICD-10-CM | POA: Diagnosis not present

## 2023-05-25 ENCOUNTER — Ambulatory Visit (HOSPITAL_COMMUNITY): Payer: No Typology Code available for payment source | Admitting: Physical Therapy

## 2023-05-25 DIAGNOSIS — M545 Low back pain, unspecified: Secondary | ICD-10-CM | POA: Diagnosis not present

## 2023-05-25 DIAGNOSIS — M6281 Muscle weakness (generalized): Secondary | ICD-10-CM

## 2023-05-25 NOTE — Therapy (Signed)
OUTPATIENT PHYSICAL THERAPY THORACOLUMBAR Treatment   Patient Name: Megan Whitney MRN: [id number] DOB:01/24/1956, 67 y.o., female Today's Date: 05/25/2023  END OF SESSION:  PT End of Session - 05/25/23 1305     Visit Number 7    Number of Visits 12    Date for PT Re-Evaluation 06/15/23    Authorization Type devoted    Progress Note Due on Visit 10    PT Start Time 1305    PT Stop Time 1345    PT Time Calculation (min) 40 min    Activity Tolerance Patient tolerated treatment well    Behavior During Therapy [facility] for tasks assessed/performed             Past Medical History:  Diagnosis Date   ADD (attention deficit disorder)    Allergy    Seasonal   Anemia    History of GI blood loss   Anxiety    Arthritis    Atrophy of vagina 10/07/2020   Bipolar 1 disorder (HCC)    Cancer (HCC)    Colon polyps    Depression    Diabetes mellitus (HCC)    pt denies   Edema, lower extremity    Epistaxis    Around 2011 or 2012, required cauterization.    Esophageal stricture    Fracture of superior pubic ramus (HCC) 11/28/2018   GERD (gastroesophageal reflux disease)    Headache(784.0)    Hyperlipidemia    Interstitial cystitis    Joint pain    Lactose intolerance    Lung cancer (HCC) 2002   Neuromuscular disorder (HCC)    Obesity    Osteoarthritis    Palpitations    Sleep apnea    Doesn't use a CPAP   Suicidal ideation 01/20/2020   Swallowing difficulty    Tardive dyskinesia    Past Surgical History:  Procedure Laterality Date   BALLOON DILATION  05/16/2012   Procedure: BALLOON DILATION;  Surgeon: Louis Meckel, MD;  Location: [facility] ENDOSCOPY;  Service: Endoscopy;  Laterality: N/A;   BUNIONECTOMY  2011   COLONOSCOPY     ENTEROSCOPY  05/16/2012   Procedure: ENTEROSCOPY;  Surgeon: Louis Meckel, MD;  Location: [facility] ENDOSCOPY;  Service: Endoscopy;  Laterality: N/A;   JOINT REPLACEMENT     right shoulder durgery 25 yrs ago  1988   TOTAL HIP ARTHROPLASTY Bilateral  2006, 2008   bilateral   TUBAL LIGATION  1990   WEDGE RESECTION  2002   lung cancer   Patient Active Problem List   Diagnosis Date Noted   DDD (degenerative disc disease), lumbar 03/27/2023   Class 1 obesity due to excess calories without serious comorbidity in adult 11/29/2022   Chronic thoracic back pain 09/22/2022   Neuroleptic-induced tardive dyskinesia 07/20/2022   Intertrigo 05/19/2022   Encounter for general adult medical examination with abnormal findings 03/22/2022   Arthritis of knee 08/17/2021   Polyp of colon 02/04/2021   Seasonal allergies 10/07/2020   Osteopenia 10/07/2020   Gait disturbance 10/07/2020   Hyperlipidemia 06/11/2020   Sleep apnea 06/11/2020   Stricture and stenosis of esophagus 05/16/2012   Hiatal hernia 05/16/2012   Dysphagia, unspecified(787.20) 05/15/2012   Depression    Bipolar disorder (HCC)    GERD (gastroesophageal reflux disease) 09/14/2010   Personal history of colonic polyps 09/14/2010    PCP: Trena Platt  REFERRING PROVIDER: Anabel Halon, MD  REFERRING DIAG: M51.36 (ICD-10-CM) - DDD (degenerative disc disease), lumbar  Rationale for Evaluation and Treatment: Rehabilitation  THERAPY DIAG:  Lumbar pain  Muscle weakness (generalized)  ONSET DATE: chronic with acute exacerbation last month.    SUBJECTIVE:                                                                                                                                                                                           SUBJECTIVE STATEMENT:  Pt reports she is not currently hurting but sometimes pain goes up as the day goes on.  Pt states the exercises usually calm down the pain.   Eval:   Pt states that she is aware that she tends to sit and list to the left side but is unable to correct this.   Pt states that her pain is worse in the afternoon.  By late afternoon she can not be up longer than five minutes.  The pain is achy and is mainly on the right side of  her back, the more she does the worse it hurts and it will radiate to the side.    PERTINENT HISTORY:   67 y.o. female with past medical history of bipolar disorder, ADD, anxiety, OSA, GERD, HLD and osteopenia and lumbar pain   PAIN:  Are you having pain? Yes: NPRS scale: 1/10, worst is an 9/10; best 0/10 in the mornings.  Pain location: Rt mid and lower back  Pain description: achy Aggravating factors: weight bearing Relieving factors: sitting , heating pad   PRECAUTIONS: None  RED FLAGS: None   WEIGHT BEARING RESTRICTIONS: No  FALLS:  Has patient fallen in last 6 months? No  LIVING ENVIRONMENT: Lives with: lives with their spouse Stairs: No Has following equipment at home: None  OCCUPATION: retired   PLOF: Independent with community mobility without device  PATIENT GOALS: less pain   NEXT MD VISIT: 6 months   OBJECTIVE:    PATIENT SURVEYS:  FOTO 43  SENSATION: WFL  POSTURE: rounded shoulders, decreased lumbar lordosis, increased thoracic kyphosis, and weight shift left   LUMBAR ROM:   AROM eval  Flexion Flexed at 10 degrees  Extension Able to go to neutral   Right lateral flexion   Left lateral flexion   Right rotation   Left rotation    (Blank rows = not tested)  *did not test forward flexion due to osteopenia. LOWER EXTREMITY MMT:    MMT Right eval Left eval  Hip flexion 5/5 4/5  Hip extension 4/5 4/5  Hip abduction 5/5 3+/5   Hip adduction    Hip internal rotation    Hip external rotation    Knee flexion 4/5 5/5  Knee extension 5/5 5/5  Ankle dorsiflexion 4/5  5/5  Ankle plantarflexion    Ankle inversion    Ankle eversion     (Blank rows = not tested)  FUNCTIONAL TESTS:  30 seconds chair stand test: 6 in 30 seconds (8 is poor for pt age and sex)  2 minute walk test: 394 feet pt leaning to the left while walking  Single leg stance:  Lt: 12, RT 0   TODAY'S TREATMENT:                                                                                                                               DATE: 05/25/23 Seated: Hands behind head with scapular squeeze x10 for thoracic mobility and scapular strengthening Shoulder horizontal abd 2x10 red TB Side pull (elbows at side ER) red TB 2x10  Sash red TB 2x10  "W" red TB 2x10  Thoracic excursions with UE movements 5X each Supine:  decompression 2-5 5X each  Bridge 10X each  SLR 10X each with core stab  Lower trunk roations 10X  Butterflies with hands behind head 10X5'  05/22/23 Standing  Lateral hip shift x 30 sec  "L" stretch x 30 sec, with lateral flexion x30 sec R&L  Glute med iso set 10x3"  Cervical retraction against wall 10x3"  Row red TB 2x10  R SLS 5 sec, L SLS 10 sec Seated Thoracic extension into chair 2x30 sec Hands behind head with scapular squeeze 2x10 for thoracic mobility and scapular strengthening Shoulder horizontal abd 2x10 red TB Side pull red TB 2x10  Sash red TB 2x10  "W" red TB 2x10  05/19/23 Manual therapy  STM & TPR R thoracic paraspinal/QL Quadruped  Child's pose x30 sec, with lateral flexion x30 sec R&L each  Cat/cow 5x10"  "Thread the needle" 5x5" Standing  "L" stretch with lateral flexion x30"  Row red TB 2x10, tactile cues to maintain proper spinal alignment  Shoulder ext red TB 2x10  Glute set iso into wall 10x3" Seated  TSA bracing + pball press down 10x5"  TSA bracing + contralateral arm and leg press into pball 10x5"   05/15/23 Seated  Scapular + cervical retraction 10x5"  Row with scap retraction 2x10 red TB  Lateral trunk flexion/stretch with pball x10  TSA bracing + pball press down 10x5"  TSA bracing + contralateral arm and leg press into pball 10x5" Supine  Shoulder horizontal abd red TB 2x10  Sash red TB 2x10  Side pull green TB 2x10  Hip hike x10 Standing  Hip hike x10 on step  L Lateral shift into wall 2x30"  Slow marching for balance 2x10 with UE support  05/12/23 Seated  Scapular retraction  10x5"  Cervical retraction 10x5"  Transversus abdominis bracing 10x5" Supine  Shoulder horizontal abd red TB 2x10  Side pull red TB 2x10  Sash red TB 2x10  Arm rotation red TB 2x10  TSA + diaphragmatic breaths x10  Bridge 2x10  05/10/23 Good body mechanics for bed mobility  Decompression exercises 1-5 Theraband decompression exercises to include: The overhead, the side pull, Sash and arm rotation  x 10 each  Bridge x 10  Abdominal set x 10  Standing :  wall arch  Sitting: combine good posture with cervical and scapular retraction x 10    05/04/23   Correct Seated Posture   10 reps - 5" hold - Seated Cervical Retraction   10 reps - 5" hold - Seated Scapular Retraction   10 reps - 5" hold - Seated Transversus Abdominis Bracing 10 x 5"   PATIENT EDUCATION:  Education details: HEP updates. Self massage using ball to help with muscle spasm Person educated: Patient Education method: Explanation, Verbal cues, and Handouts Education comprehension: verbalized understanding and returned demonstration  HOME EXERCISE PROGRAM: Access Code: [email or web address] URL: [email or web address] Date: 05/04/2023 Prepared by: Virgina Organ  Exercises - Correct Seated Posture  - 4 x daily - 7 x weekly - 1 sets - 10 reps - 5" hold - Seated Cervical Retraction  - 3 x daily - 7 x weekly - 1 sets - 10 reps - 5" hold - Seated Scapular Retraction  - 3 x daily - 7 x weekly - 1 sets - 10 reps - 5" hold - Seated Transversus Abdominis Bracing  - 3 x daily - 7 x weekly - 10 reps - 5" hold  ASSESSMENT:  CLINICAL IMPRESSION:   Continued focus on improving core and postural strength while reducing pain.  Began seated exercises with difficulty maintaining good posture. Educated to keep gaze forward rather than down when completing activities to help promote a more upright posturing. Continue with decompression and core exercises to help strengthen posture as well.  Thoracic excursions and Butterflies in  supine to address thoracic mobility. No pain during session, frequent cues as tends to slump and look down. Pt will continue to benefit from skilled therapy.   OBJECTIVE IMPAIRMENTS: Abnormal gait, decreased activity tolerance, decreased balance, decreased mobility, difficulty walking, decreased ROM, decreased strength, increased fascial restrictions, postural dysfunction, and pain.   ACTIVITY LIMITATIONS: carrying, lifting, standing, squatting, stairs, and locomotion level  PARTICIPATION LIMITATIONS: meal prep, cleaning, laundry, shopping, and community activity  PERSONAL FACTORS: Time since onset of injury/illness/exacerbation and 1-2 comorbidities: OA and osteopenia  are also affecting patient's functional outcome.   REHAB POTENTIAL: Good  CLINICAL DECISION MAKING: Evolving/moderate complexity  EVALUATION COMPLEXITY: Moderate   GOALS: Goals reviewed with patient? No  SHORT TERM GOALS: Target date: 05/25/23  Pt to be I in HEP in order to decrease her pain to no greater than a 6/10 Baseline: Goal status: on-going  2.  Pt to improve her core strength to be able to stand in an upright position  Baseline:  Goal status: on-going  3.  PT to be able to single leg stance for 10 seconds on her RT LE to decrease risk of falling  Baseline:  Goal status: on-going   LONG TERM GOALS: Target date: 06/15/23  Pt to be I in an advanced HEP in order to decrease her pain to no greater than a 3/10 Baseline:  Goal status: on-going 2.  Pt to improve her core strength to be able to stand/sit without listing to the left for 30 minutes. Baseline:  Goal status: on-going  3.  PT to be able to single leg stance for 20 seconds on her RT LE to decrease risk of falling Baseline:  Goal status: on-going   PLAN:  PT FREQUENCY: 2x/week  PT DURATION: 6 weeks  PLANNED  INTERVENTIONS: Therapeutic exercises, Therapeutic activity, Balance training, Gait training, Patient/Family education, Self Care,  and Manual therapy.  PLAN FOR NEXT SESSION:   Progress stab exercises as tolerated, improve posture.   Bascom Levels, Copeland Neisen B, PTA 05/25/2023, 1:05 PM

## 2023-05-29 ENCOUNTER — Ambulatory Visit: Payer: No Typology Code available for payment source | Admitting: Psychiatry

## 2023-05-29 DIAGNOSIS — F3132 Bipolar disorder, current episode depressed, moderate: Secondary | ICD-10-CM | POA: Diagnosis not present

## 2023-05-29 NOTE — Progress Notes (Signed)
[facility] Counselor/Therapist Progress Note  Patient ID: Megan Whitney, MRN: [id number],    Date: 05/29/2023  Time Spent: 50 minutes   Treatment Type: Individual Therapy  Reported Symptoms: depression, anxiety, irritable, obsessive thinking and feeling overwhelmed, denies any SI  Mental Status Exam:  Appearance:   Casual     Behavior:  Appropriate, Sharing, and Motivated  Motor:  Normal  Speech/Language:   Clear and Coherent  Affect:  Depressed and anxious  Mood:  anxious and depressed  Thought process:  goal directed  Thought content:    Rumination  Sensory/Perceptual disturbances:    WNL  Orientation:  oriented to person, place, time/date, situation, day of week, month of year, year, and stated date of Sept. 23, 2024  Attention:  Fair  Concentration:  Fair  Memory:  WNL  Fund of knowledge:   Good  Insight:    Good and Fair  Judgment:   Good  Impulse Control:  Good   Risk Assessment: Danger to Self:  No Self-injurious Behavior: No Danger to Others: No Duty to Warn:no Physical Aggression / Violence:No  Access to Firearms a concern: No  Gang Involvement:No   Subjective:  Patient in for session today reporting stress is "not quite as bad as last appt". 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 Today concerned about husband's particular choices on some issues and how this affects their relationship. (Not all details included in this note due to patient privacy needs.) Processed in session today some very sensitive issues surrounding her marriage and many challenges in the past and present. Patient able to process a lot of emotions and did seem to feel more self-assured. Less anger, racing thoughts significantly decreased. Less tearfulness. Less ruminating. Good support system including friends and extended family.   Interventions: Cognitive Behavioral Therapy and Ego-Supportive  Long Term Goal: Reduce overall level, frequency, and intensity of the anxiety so that  daily functioning is not impaired. Short Term Goal: 1.Increase understanding of the beliefs and messages that produce the worry and anxiety. Strategies: 1.Help client develop reality-based positive cognitive messages/self-talk. 2. Develop a "coping card" or other reminder which coping strategies are recorded for patient's later use.  Diagnosis:   ICD-10-CM   1. Bipolar disorder with moderate depression (HCC)  F31.32      Plan:  Patient in session today and following up on her continued need to set appropriate limits/boundaries within the family and others, her anticipatory grief regarding husband's terminal illness, her anxiety, depression, obsessive thoughts and working to be more motivated.  Has shown more self-care which is a positive for her.  Shows good commitment in therapy and working on her goals and has definitely made progress, although does sometimes fluctuate and her level of strength and belief in herself, but overall less self-doubt.  Patient does need to continue working with goal-directed behaviors supporting her work on treatment goals and moving in a healthier direction. Reminded, reviewed, and encouraged patient in her practice of more positive and self affirming behaviors as noted in session including: Letting friends be supportive of her as this is proven to be helpful to her over time, remaining focused on making good decisions versus impulsive decisions, believe more in herself and her ability to manage challenging circumstances, refrain from self sabotaging her goals, stay in contact with supportive family and friends, use of journaling between sessions, setting limits with people who are not supportive, refrain from assuming worst-case scenarios, look for the strengths and positives within herself, spend  time with her 2 dogs which is very therapeutic for her, use of positive self talk, and realize the strength she shows working with goal-directed behaviors to move in a direction  that supports her improved emotional health and outlook into the future.  Goal review and progress/challenges noted with patient.  Next appointment within 2-3 weeks.   Mathis Fare, LCSW

## 2023-05-30 ENCOUNTER — Ambulatory Visit (HOSPITAL_COMMUNITY): Payer: No Typology Code available for payment source | Admitting: Physical Therapy

## 2023-05-30 ENCOUNTER — Encounter (HOSPITAL_COMMUNITY): Payer: Self-pay | Admitting: Physical Therapy

## 2023-05-30 DIAGNOSIS — M6281 Muscle weakness (generalized): Secondary | ICD-10-CM

## 2023-05-30 DIAGNOSIS — M545 Low back pain, unspecified: Secondary | ICD-10-CM

## 2023-05-30 DIAGNOSIS — F319 Bipolar disorder, unspecified: Secondary | ICD-10-CM | POA: Diagnosis not present

## 2023-05-30 DIAGNOSIS — Z79899 Other long term (current) drug therapy: Secondary | ICD-10-CM | POA: Diagnosis not present

## 2023-05-30 NOTE — Therapy (Signed)
OUTPATIENT PHYSICAL THERAPY THORACOLUMBAR Treatment   Patient Name: Megan Whitney MRN: [id number] DOB:[birth year], 67 y.o., female Today's Date: 05/30/2023  END OF SESSION:  PT End of Session - 05/30/23 1104     Visit Number 8    Number of Visits 12    Date for PT Re-Evaluation 06/15/23    Authorization Type devoted    Progress Note Due on Visit 10    PT Start Time 1104    PT Stop Time 1145    PT Time Calculation (min) 41 min    Activity Tolerance Patient tolerated treatment well    Behavior During Therapy [facility] for tasks assessed/performed              Past Medical History:  Diagnosis Date   ADD (attention deficit disorder)    Allergy    Seasonal   Anemia    History of GI blood loss   Anxiety    Arthritis    Atrophy of vagina 10/07/2020   Bipolar 1 disorder (HCC)    Cancer (HCC)    Colon polyps    Depression    Diabetes mellitus (HCC)    pt denies   Edema, lower extremity    Epistaxis    Around 2011 or 2012, required cauterization.    Esophageal stricture    Fracture of superior pubic ramus (HCC) 11/28/2018   GERD (gastroesophageal reflux disease)    Headache(784.0)    Hyperlipidemia    Interstitial cystitis    Joint pain    Lactose intolerance    Lung cancer (HCC) 2002   Neuromuscular disorder (HCC)    Obesity    Osteoarthritis    Palpitations    Sleep apnea    Doesn't use a CPAP   Suicidal ideation 01/20/2020   Swallowing difficulty    Tardive dyskinesia    Past Surgical History:  Procedure Laterality Date   BALLOON DILATION  05/16/2012   Procedure: BALLOON DILATION;  Surgeon: Louis Meckel, MD;  Location: [facility] ENDOSCOPY;  Service: Endoscopy;  Laterality: N/A;   BUNIONECTOMY  2011   COLONOSCOPY     ENTEROSCOPY  05/16/2012   Procedure: ENTEROSCOPY;  Surgeon: Louis Meckel, MD;  Location[facility] ENDOSCOPY;  Service: Endoscopy;  Laterality: N/A;   JOINT REPLACEMENT     right shoulder durgery 25 yrs ago  1988   TOTAL HIP ARTHROPLASTY Bilateral  2006, 2008   bilateral   TUBAL LIGATION  1990   WEDGE RESECTION  2002   lung cancer   Patient Active Problem List   Diagnosis Date Noted   DDD (degenerative disc disease), lumbar 03/27/2023   Class 1 obesity due to excess calories without serious comorbidity in adult 11/29/2022   Chronic thoracic back pain 09/22/2022   Neuroleptic-induced tardive dyskinesia 07/20/2022   Intertrigo 05/19/2022   Encounter for general adult medical examination with abnormal findings 03/22/2022   Arthritis of knee 08/17/2021   Polyp of colon 02/04/2021   Seasonal allergies 10/07/2020   Osteopenia 10/07/2020   Gait disturbance 10/07/2020   Hyperlipidemia 06/11/2020   Sleep apnea 06/11/2020   Stricture and stenosis of esophagus 05/16/2012   Hiatal hernia 05/16/2012   Dysphagia, unspecified(787.20) 05/15/2012   Depression    Bipolar disorder (HCC)    GERD (gastroesophageal reflux disease) 09/14/2010   Personal history of colonic polyps 09/14/2010    PCP: Trena Platt  REFERRING PROVIDER: Anabel Halon, MD  REFERRING DIAG: M51.36 (ICD-10-CM) - DDD (degenerative disc disease), lumbar  Rationale for Evaluation and Treatment:  Rehabilitation  THERAPY DIAG:  Lumbar pain  Muscle weakness (generalized)  ONSET DATE: chronic with acute exacerbation last month.    SUBJECTIVE:                                                                                                                                                                                           SUBJECTIVE STATEMENT:  Pt states her back has been hurting worse in the afternoon but if she can catch it and do her exercises it feels better.   Eval:   Pt states that she is aware that she tends to sit and list to the left side but is unable to correct this.   Pt states that her pain is worse in the afternoon.  By late afternoon she can not be up longer than five minutes.  The pain is achy and is mainly on the right side of her back, the more  she does the worse it hurts and it will radiate to the side.    PERTINENT HISTORY:   67 y.o. female with past medical history of bipolar disorder, ADD, anxiety, OSA, GERD, HLD and osteopenia and lumbar pain   PAIN:  Are you having pain? Yes: NPRS scale: 1/10, worst is an 9/10; best 0/10 in the mornings.  Pain location: Rt mid and lower back  Pain description: achy Aggravating factors: weight bearing Relieving factors: sitting , heating pad   PRECAUTIONS: None  RED FLAGS: None   WEIGHT BEARING RESTRICTIONS: No  FALLS:  Has patient fallen in last 6 months? No  LIVING ENVIRONMENT: Lives with: lives with their spouse Stairs: No Has following equipment at home: None  OCCUPATION: retired   PLOF: Independent with community mobility without device  PATIENT GOALS: less pain   NEXT MD VISIT: 6 months   OBJECTIVE:    PATIENT SURVEYS:  FOTO 43  SENSATION: WFL  POSTURE: rounded shoulders, decreased lumbar lordosis, increased thoracic kyphosis, and weight shift left   LUMBAR ROM:   AROM eval  Flexion Flexed at 10 degrees  Extension Able to go to neutral   Right lateral flexion   Left lateral flexion   Right rotation   Left rotation    (Blank rows = not tested)  *did not test forward flexion due to osteopenia. LOWER EXTREMITY MMT:    MMT Right eval Left eval  Hip flexion 5/5 4/5  Hip extension 4/5 4/5  Hip abduction 5/5 3+/5   Hip adduction    Hip internal rotation    Hip external rotation    Knee flexion 4/5 5/5  Knee extension 5/5 5/5  Ankle dorsiflexion 4/5 5/5  Ankle plantarflexion    Ankle inversion    Ankle eversion     (Blank rows = not tested)  FUNCTIONAL TESTS:  30 seconds chair stand test: 6 in 30 seconds (8 is poor for pt age and sex)  2 minute walk test: 394 feet pt leaning to the left while walking  Single leg stance:  Lt: 12, RT 0   TODAY'S TREATMENT:                                                                                                                               DATE: 05/30/23 Standing L stretch x30 sec; with lateral flexion x30 sec R&L Lateral shift into wall 3x30 sec Prone  "I" with scap squeeze 2x10  "W" 2x10  "T" 2x10 Manual therapy  STM & TPR R thoracolumbar paraspinal Standing  Row red TB 2x10  Shoulder ext red TB 2x10  Against wall scap squeeze + cervical retraction 2x10  05/25/23 Seated: Hands behind head with scapular squeeze x10 for thoracic mobility and scapular strengthening Shoulder horizontal abd 2x10 red TB Side pull (elbows at side ER) red TB 2x10  Sash red TB 2x10  "W" red TB 2x10  Thoracic excursions with UE movements 5X each Supine:  decompression 2-5 5X each  Bridge 10X each  SLR 10X each with core stab  Lower trunk roations 10X  Butterflies with hands behind head 10X5'  05/22/23 Standing  Lateral hip shift x 30 sec  "L" stretch x 30 sec, with lateral flexion x30 sec R&L  Glute med iso set 10x3"  Cervical retraction against wall 10x3"  Row red TB 2x10  R SLS 5 sec, L SLS 10 sec Seated Thoracic extension into chair 2x30 sec Hands behind head with scapular squeeze 2x10 for thoracic mobility and scapular strengthening Shoulder horizontal abd 2x10 red TB Side pull red TB 2x10  Sash red TB 2x10  "W" red TB 2x10  05/19/23 Manual therapy  STM & TPR R thoracic paraspinal/QL Quadruped  Child's pose x30 sec, with lateral flexion x30 sec R&L each  Cat/cow 5x10"  "Thread the needle" 5x5" Standing  "L" stretch with lateral flexion x30"  Row red TB 2x10, tactile cues to maintain proper spinal alignment  Shoulder ext red TB 2x10  Glute set iso into wall 10x3" Seated  TSA bracing + pball press down 10x5"  TSA bracing + contralateral arm and leg press into pball 10x5"   05/15/23 Seated  Scapular + cervical retraction 10x5"  Row with scap retraction 2x10 red TB  Lateral trunk flexion/stretch with pball x10  TSA bracing + pball press down 10x5"  TSA bracing +  contralateral arm and leg press into pball 10x5" Supine  Shoulder horizontal abd red TB 2x10  Sash red TB 2x10  Side pull green TB 2x10  Hip hike x10 Standing  Hip hike x10 on step  L Lateral shift into wall 2x30"  Slow marching for balance 2x10 with  UE support  05/12/23 Seated  Scapular retraction 10x5"  Cervical retraction 10x5"  Transversus abdominis bracing 10x5" Supine  Shoulder horizontal abd red TB 2x10  Side pull red TB 2x10  Sash red TB 2x10  Arm rotation red TB 2x10  TSA + diaphragmatic breaths x10  Bridge 2x10  05/10/23 Good body mechanics for bed mobility Decompression exercises 1-5 Theraband decompression exercises to include: The overhead, the side pull, Sash and arm rotation  x 10 each  Bridge x 10  Abdominal set x 10  Standing :  wall arch  Sitting: combine good posture with cervical and scapular retraction x 10    05/04/23   Correct Seated Posture   10 reps - 5" hold - Seated Cervical Retraction   10 reps - 5" hold - Seated Scapular Retraction   10 reps - 5" hold - Seated Transversus Abdominis Bracing 10 x 5"   PATIENT EDUCATION:  Education details: HEP updates. Self massage using ball to help with muscle spasm Person educated: Patient Education method: Explanation, Verbal cues, and Handouts Education comprehension: verbalized understanding and returned demonstration  HOME EXERCISE PROGRAM: Access Code: [email or web address] URL: [email or web address] Date: 05/30/2023 Prepared by: Vernon Prey April Kirstie Peri  Exercises - seated posture with towel roll  - 1 x daily - 7 x weekly - 1 sets - 1 reps - Seated Upper Trapezius Stretch  - 2 x daily - 7 x weekly - 1 sets - 5 reps - 20 sec hold - Seated Cervical Rotation AROM  - 2 x daily - 7 x weekly - 1 sets - 10 reps - 5 sec hold - Seated Scapular Retraction  - 2 x daily - 7 x weekly - 1 sets - 10 reps - 5 sec hold - Seated Gentle Upper Trapezius Stretch  - 2 x daily - 7 x weekly - 1 sets - 3 reps - 20"  hold - Standing Backward Shoulder Rolls  - 2 x daily - 7 x weekly - 1 sets - 10 reps - Supine Cervical Retraction with Towel  - 2 x daily - 7 x weekly - 1 sets - 10 reps - 5" hold - Shoulder extension with resistance - Neutral  - 1 x daily - 7 x weekly - 2 sets - 10 reps  ASSESSMENT:  CLINICAL IMPRESSION:   Increased R thoracic paraspinal spasm today. Manual work performed to address accordingly. Encouraged pt to maintain self massage at home. Found pt to be overcompensating utilizing her paraspinals when trying to utilize periscapular muscles for postural stability. Continues to have difficulty activating rhomboids and mid/low traps.   OBJECTIVE IMPAIRMENTS: Abnormal gait, decreased activity tolerance, decreased balance, decreased mobility, difficulty walking, decreased ROM, decreased strength, increased fascial restrictions, postural dysfunction, and pain.   ACTIVITY LIMITATIONS: carrying, lifting, standing, squatting, stairs, and locomotion level  PARTICIPATION LIMITATIONS: meal prep, cleaning, laundry, shopping, and community activity  PERSONAL FACTORS: Time since onset of injury/illness/exacerbation and 1-2 comorbidities: OA and osteopenia  are also affecting patient's functional outcome.   REHAB POTENTIAL: Good  CLINICAL DECISION MAKING: Evolving/moderate complexity  EVALUATION COMPLEXITY: Moderate   GOALS: Goals reviewed with patient? No  SHORT TERM GOALS: Target date: 05/25/23  Pt to be I in HEP in order to decrease her pain to no greater than a 6/10 Baseline: Goal status: on-going  2.  Pt to improve her core strength to be able to stand in an upright position  Baseline:  Goal status: on-going  3.  PT to be able to single  leg stance for 10 seconds on her RT LE to decrease risk of falling  Baseline:  Goal status: on-going   LONG TERM GOALS: Target date: 06/15/23  Pt to be I in an advanced HEP in order to decrease her pain to no greater than a 3/10 Baseline:  Goal  status: on-going 2.  Pt to improve her core strength to be able to stand/sit without listing to the left for 30 minutes. Baseline:  Goal status: on-going  3.  PT to be able to single leg stance for 20 seconds on her RT LE to decrease risk of falling Baseline:  Goal status: on-going   PLAN:  PT FREQUENCY: 2x/week  PT DURATION: 6 weeks  PLANNED INTERVENTIONS: Therapeutic exercises, Therapeutic activity, Balance training, Gait training, Patient/Family education, Self Care, and Manual therapy.  PLAN FOR NEXT SESSION:   Progress stab exercises as tolerated, improve posture.   Shantasia Hunnell April Ma L Lydian Chavous, PT 05/30/2023, 11:04 AM

## 2023-05-31 ENCOUNTER — Encounter: Payer: Self-pay | Admitting: Psychiatry

## 2023-06-01 ENCOUNTER — Ambulatory Visit (INDEPENDENT_AMBULATORY_CARE_PROVIDER_SITE_OTHER): Payer: No Typology Code available for payment source

## 2023-06-01 ENCOUNTER — Ambulatory Visit (HOSPITAL_COMMUNITY): Payer: No Typology Code available for payment source | Admitting: Physical Therapy

## 2023-06-01 ENCOUNTER — Encounter (HOSPITAL_COMMUNITY): Payer: Self-pay | Admitting: Physical Therapy

## 2023-06-01 VITALS — Ht 60.0 in | Wt 143.1 lb

## 2023-06-01 DIAGNOSIS — Z0000 Encounter for general adult medical examination without abnormal findings: Secondary | ICD-10-CM | POA: Diagnosis not present

## 2023-06-01 DIAGNOSIS — Z789 Other specified health status: Secondary | ICD-10-CM

## 2023-06-01 DIAGNOSIS — Z1231 Encounter for screening mammogram for malignant neoplasm of breast: Secondary | ICD-10-CM

## 2023-06-01 DIAGNOSIS — M6281 Muscle weakness (generalized): Secondary | ICD-10-CM

## 2023-06-01 DIAGNOSIS — M545 Low back pain, unspecified: Secondary | ICD-10-CM

## 2023-06-01 DIAGNOSIS — Z1211 Encounter for screening for malignant neoplasm of colon: Secondary | ICD-10-CM

## 2023-06-01 DIAGNOSIS — H9193 Unspecified hearing loss, bilateral: Secondary | ICD-10-CM

## 2023-06-01 NOTE — Patient Instructions (Signed)
Megan Whitney , Thank you for taking time to come for your Medicare Wellness Visit. I appreciate your ongoing commitment to your health goals. Please review the following plan we discussed and let me know if I can assist you in the future.   Referrals/Orders/Follow-Ups/Clinician Recommendations:  Next Medicare Annual Wellness Visit: September 04, 2024 at 10:40am  You have an order for:  []   2D Mammogram  [x]   3D Mammogram  []   Bone Density   []   Lung Cancer Screening  Please call for appointment:   [facility] [address]iology [place], [place] [address] [phone]  Make sure to wear two-piece clothing.  No lotions powders or deodorants the day of the appointment Make sure to bring picture ID and insurance card.  Bring list of medications you are currently taking including any supplements.   Schedule your [place] screening mammogram through MyChart!   Log into your MyChart account.  Go to 'Visit' (or 'Appointments' if on mobile App) --> Schedule an Appointment  Under 'Select a Reason for Visit' choose the Mammogram Screening option.  Complete the pre-visit questions and select the time and place that best fits your schedule.   You have been referred to see Dr. Yancey Flemings to discuss a screening colonoscopy. If you have not heard from his office in 1 week, please call them to schedule your appointment.  Lynnea Maizes, MD Address: [address], [place], [place] [address] Phone: [phone]    This is a list of the screening recommended for you and due dates:  Health Maintenance  Topic Date Due   Flu Shot  04/06/2023   COVID-19 Vaccine (4 - 2023-24 season) 05/07/2023   Colon Cancer Screening  09/18/2023   Mammogram  07/09/2023   Medicare Annual Wellness Visit  05/31/2024   DEXA scan (bone density measurement)  07/08/2024   DTaP/Tdap/Td vaccine (4 - Td or Tdap) 09/05/2028   Pneumonia Vaccine  Completed   Hepatitis C  Screening  Completed   Zoster (Shingles) Vaccine  Completed   HPV Vaccine  Aged Out    Advanced directives: (Copy Requested) Please bring a copy of your health care power of attorney and living will to the office to be added to your chart at your convenience.  Next Medicare Annual Wellness Visit scheduled for next year: Yes  Preventive Care 42 Years and Older, Female Preventive care refers to lifestyle choices and visits with your health care provider that can promote health and wellness. Preventive care visits are also called wellness exams. What can I expect for my preventive care visit? Counseling Your health care provider may ask you questions about your: Medical history, including: Past medical problems. Family medical history. Pregnancy and menstrual history. History of falls. Current health, including: Memory and ability to understand (cognition). Emotional well-being. Home life and relationship well-being. Sexual activity and sexual health. Lifestyle, including: Alcohol, nicotine or tobacco, and drug use. Access to firearms. Diet, exercise, and sleep habits. Work and work Astronomer. Sunscreen use. Safety issues such as seatbelt and bike helmet use. Physical exam Your health care provider will check your: Height and weight. These may be used to calculate your BMI (body mass index). BMI is a measurement that tells if you are at a healthy weight. Waist circumference. This measures the distance around your waistline. This measurement also tells if you are at a healthy weight and may help predict your risk of certain diseases, such as type  2 diabetes and high blood pressure. Heart rate and blood pressure. Body temperature. Skin for abnormal spots. What immunizations do I need?  Vaccines are usually given at various ages, according to a schedule. Your health care provider will recommend vaccines for you based on your age, medical history, and lifestyle or other factors,  such as travel or where you work. What tests do I need? Screening Your health care provider may recommend screening tests for certain conditions. This may include: Lipid and cholesterol levels. Hepatitis C test. Hepatitis B test. HIV (human immunodeficiency virus) test. STI (sexually transmitted infection) testing, if you are at risk. Lung cancer screening. Colorectal cancer screening. Diabetes screening. This is done by checking your blood sugar (glucose) after you have not eaten for a while (fasting). Mammogram. Talk with your health care provider about how often you should have regular mammograms. BRCA-related cancer screening. This may be done if you have a family history of breast, ovarian, tubal, or peritoneal cancers. Bone density scan. This is done to screen for osteoporosis. Talk with your health care provider about your test results, treatment options, and if necessary, the need for more tests. Follow these instructions at home: Eating and drinking  Eat a diet that includes fresh fruits and vegetables, whole grains, lean protein, and low-fat dairy products. Limit your intake of foods with high amounts of sugar, saturated fats, and salt. Take vitamin and mineral supplements as recommended by your health care provider. Do not drink alcohol if your health care provider tells you not to drink. If you drink alcohol: Limit how much you have to 0-1 drink a day. Know how much alcohol is in your drink. In the [place] one drink equals one 12 oz bottle of beer (355 mL), one 5 oz glass of wine (148 mL), or one 1 oz glass of hard liquor (44 mL). Lifestyle Brush your teeth every morning and night with fluoride toothpaste. Floss one time each day. Exercise for at least 30 minutes 5 or more days each week. Do not use any products that contain nicotine or tobacco. These products include cigarettes, chewing tobacco, and vaping devices, such as e-cigarettes. If you need help quitting, ask your  health care provider. Do not use drugs. If you are sexually active, practice safe sex. Use a condom or other form of protection in order to prevent STIs. Take aspirin only as told by your health care provider. Make sure that you understand how much to take and what form to take. Work with your health care provider to find out whether it is safe and beneficial for you to take aspirin daily. Ask your health care provider if you need to take a cholesterol-lowering medicine (statin). Find healthy ways to manage stress, such as: Meditation, yoga, or listening to music. Journaling. Talking to a trusted person. Spending time with friends and family. Minimize exposure to UV radiation to reduce your risk of skin cancer. Safety Always wear your seat belt while driving or riding in a vehicle. Do not drive: If you have been drinking alcohol. Do not ride with someone who has been drinking. When you are tired or distracted. While texting. If you have been using any mind-altering substances or drugs. Wear a helmet and other protective equipment during sports activities. If you have firearms in your house, make sure you follow all gun safety procedures. What's next? Visit your health care provider once a year for an annual wellness visit. Ask your health care provider how often you should have  your eyes and teeth checked. Stay up to date on all vaccines. This information is not intended to replace advice given to you by your health care provider. Make sure you discuss any questions you have with your health care provider. Document Revised: 02/17/2021 Document Reviewed: 02/17/2021 Elsevier Patient Education  2024 [employer]. Understanding Your Risk for Falls Millions of people have serious injuries from falls each year. It is important to understand your risk of falling. Talk with your health care provider about your risk and what you can do to lower it. If you do have a serious fall, make sure to  tell your provider. Falling once raises your risk of falling again. How can falls affect me? Serious injuries from falls are common. These include: Broken bones, such as hip fractures. Head injuries, such as traumatic brain injuries (TBI) or concussions. A fear of falling can cause you to avoid activities and stay at home. This can make your muscles weaker and raise your risk for a fall. What can increase my risk? There are a number of risk factors that increase your risk for falling. The more risk factors you have, the higher your risk of falling. Serious injuries from a fall happen most often to people who are older than 67 years old. Teenagers and young adults ages 28-29 are also at higher risk. Common risk factors include: Weakness in the lower body. Being generally weak or confused due to long-term (chronic) illness. Dizziness or balance problems. Poor vision. Medicines that cause dizziness or drowsiness. These may include: Medicines for your blood pressure, heart, anxiety, insomnia, or swelling (edema). Pain medicines. Muscle relaxants. Other risk factors include: Drinking alcohol. Having had a fall in the past. Having foot pain or wearing improper footwear. Working at a dangerous job. Having any of the following in your home: Tripping hazards, such as floor clutter or loose rugs. Poor lighting. Pets. Having dementia or memory loss. What actions can I take to lower my risk of falling?     Physical activity Stay physically fit. Do strength and balance exercises. Consider taking a regular class to build strength and balance. Yoga and tai chi are good options. Vision Have your eyes checked every year and your prescription for glasses or contacts updated as needed. Shoes and walking aids Wear non-skid shoes. Wear shoes that have rubber soles and low heels. Do not wear high heels. Do not walk around the house in socks or slippers. Use a cane or walker as told by your  provider. Home safety Attach secure railings on both sides of your stairs. Install grab bars for your bathtub, shower, and toilet. Use a non-skid mat in your bathtub or shower. Attach bath mats securely with double-sided, non-slip rug tape. Use good lighting in all rooms. Keep a flashlight near your bed. Make sure there is a clear path from your bed to the bathroom. Use night-lights. Do not use throw rugs. Make sure all carpeting is taped or tacked down securely. Remove all clutter from walkways and stairways, including extension cords. Repair uneven or broken steps and floors. Avoid walking on icy or slippery surfaces. Walk on the grass instead of on icy or slick sidewalks. Use ice melter to get rid of ice on walkways in the winter. Use a cordless phone. Questions to ask your health care provider Can you help me check my risk for a fall? Do any of my medicines make me more likely to fall? Should I take a vitamin D supplement? What exercises  can I do to improve my strength and balance? Should I make an appointment to have my vision checked? Do I need a bone density test to check for weak bones (osteoporosis)? Would it help to use a cane or a walker? Where to find more information Centers for Disease Control and Prevention, [employer]: TonerPromos.no Community-Based Fall Prevention Programs: TonerPromos.no [employer] on Aging: BaseRingTones.pl Contact a health care provider if: You fall at home. You are afraid of falling at home. You feel weak, drowsy, or dizzy. This information is not intended to replace advice given to you by your health care provider. Make sure you discuss any questions you have with your health care provider. Document Revised: 04/25/2022 Document Reviewed: 04/25/2022 Elsevier Patient Education  2024 [employer].

## 2023-06-01 NOTE — Therapy (Signed)
OUTPATIENT PHYSICAL THERAPY THORACOLUMBAR TREATMENT   Patient Name: Megan Whitney MRN: [id number] DOB:09-25-1955, 67 y.o., female Today's Date: 06/01/2023  END OF SESSION:  PT End of Session - 06/01/23 1001     Visit Number 9    Number of Visits 12    Date for PT Re-Evaluation 06/15/23    Authorization Type devoted    Progress Note Due on Visit 10    PT Start Time 1005    PT Stop Time 1045    PT Time Calculation (min) 40 min    Activity Tolerance Patient tolerated treatment well    Behavior During Therapy [facility] for tasks assessed/performed               Past Medical History:  Diagnosis Date   ADD (attention deficit disorder)    Allergy    Seasonal   Anemia    History of GI blood loss   Anxiety    Arthritis    Atrophy of vagina 10/07/2020   Bipolar 1 disorder (HCC)    Cancer (HCC)    Colon polyps    Depression    Diabetes mellitus (HCC)    pt denies   Edema, lower extremity    Epistaxis    Around 2011 or 2012, required cauterization.    Esophageal stricture    Fracture of superior pubic ramus (HCC) 11/28/2018   GERD (gastroesophageal reflux disease)    Headache(784.0)    Hyperlipidemia    Interstitial cystitis    Joint pain    Lactose intolerance    Lung cancer (HCC) 2002   Neuromuscular disorder (HCC)    Obesity    Osteoarthritis    Palpitations    Sleep apnea    Doesn't use a CPAP   Suicidal ideation 01/20/2020   Swallowing difficulty    Tardive dyskinesia    Past Surgical History:  Procedure Laterality Date   BALLOON DILATION  05/16/2012   Procedure: BALLOON DILATION;  Surgeon: Louis Meckel, MD;  Location: [facility] ENDOSCOPY;  Service: Endoscopy;  Laterality: N/A;   BUNIONECTOMY  2011   COLONOSCOPY     ENTEROSCOPY  05/16/2012   Procedure: ENTEROSCOPY;  Surgeon: Louis Meckel, MD;  Location: [facility] ENDOSCOPY;  Service: Endoscopy;  Laterality: N/A;   JOINT REPLACEMENT     right shoulder durgery 25 yrs ago  1988   TOTAL HIP ARTHROPLASTY  Bilateral 2006, 2008   bilateral   TUBAL LIGATION  1990   WEDGE RESECTION  2002   lung cancer   Patient Active Problem List   Diagnosis Date Noted   DDD (degenerative disc disease), lumbar 03/27/2023   Class 1 obesity due to excess calories without serious comorbidity in adult 11/29/2022   Chronic thoracic back pain 09/22/2022   Neuroleptic-induced tardive dyskinesia 07/20/2022   Intertrigo 05/19/2022   Encounter for general adult medical examination with abnormal findings 03/22/2022   Arthritis of knee 08/17/2021   Polyp of colon 02/04/2021   Seasonal allergies 10/07/2020   Osteopenia 10/07/2020   Gait disturbance 10/07/2020   Hyperlipidemia 06/11/2020   Sleep apnea 06/11/2020   Stricture and stenosis of esophagus 05/16/2012   Hiatal hernia 05/16/2012   Dysphagia, unspecified(787.20) 05/15/2012   Depression    Bipolar disorder (HCC)    GERD (gastroesophageal reflux disease) 09/14/2010   Personal history of colonic polyps 09/14/2010    PCP: Trena Platt  REFERRING PROVIDER: Anabel Halon, MD  REFERRING DIAG: M51.36 (ICD-10-CM) - DDD (degenerative disc disease), lumbar  Rationale for Evaluation and  Treatment: Rehabilitation  THERAPY DIAG:  Lumbar pain  Muscle weakness (generalized)  ONSET DATE: chronic with acute exacerbation last month.    SUBJECTIVE:                                                                                                                                                                                           SUBJECTIVE STATEMENT:  Pt states her back was feeling better yesterday after PT treatment.   Eval:   Pt states that she is aware that she tends to sit and list to the left side but is unable to correct this.   Pt states that her pain is worse in the afternoon.  By late afternoon she can not be up longer than five minutes.  The pain is achy and is mainly on the right side of her back, the more she does the worse it hurts and it will  radiate to the side.    PERTINENT HISTORY:   67 y.o. female with past medical history of bipolar disorder, ADD, anxiety, OSA, GERD, HLD and osteopenia and lumbar pain   PAIN:  Are you having pain? Yes: NPRS scale: 1/10, worst is an 9/10; best 0/10 in the mornings.  Pain location: Rt mid and lower back  Pain description: achy Aggravating factors: weight bearing Relieving factors: sitting , heating pad   PRECAUTIONS: None  RED FLAGS: None   WEIGHT BEARING RESTRICTIONS: No  FALLS:  Has patient fallen in last 6 months? No  LIVING ENVIRONMENT: Lives with: lives with their spouse Stairs: No Has following equipment at home: None  OCCUPATION: retired   PLOF: Independent with community mobility without device  PATIENT GOALS: less pain   NEXT MD VISIT: 6 months   OBJECTIVE:    PATIENT SURVEYS:  FOTO 43  SENSATION: WFL  POSTURE: rounded shoulders, decreased lumbar lordosis, increased thoracic kyphosis, and weight shift left   LUMBAR ROM:   AROM eval  Flexion Flexed at 10 degrees  Extension Able to go to neutral   Right lateral flexion   Left lateral flexion   Right rotation   Left rotation    (Blank rows = not tested)  *did not test forward flexion due to osteopenia. LOWER EXTREMITY MMT:    MMT Right eval Left eval  Hip flexion 5/5 4/5  Hip extension 4/5 4/5  Hip abduction 5/5 3+/5   Hip adduction    Hip internal rotation    Hip external rotation    Knee flexion 4/5 5/5  Knee extension 5/5 5/5  Ankle dorsiflexion 4/5 5/5  Ankle plantarflexion    Ankle inversion    Ankle  eversion     (Blank rows = not tested)  FUNCTIONAL TESTS:  30 seconds chair stand test: 6 in 30 seconds (8 is poor for pt age and sex)  2 minute walk test: 394 feet pt leaning to the left while walking  Single leg stance:  Lt: 12, RT 0 06/01/23: Rt 10.49 sec, 23.36 sec   TODAY'S TREATMENT:                                                                                                                               DATE: 06/01/23 UBE L1, 2 min forward, 2 min backward Thoracic extension into chair x10 Thoracic ext + rotation to R x10 Doorway pec stretch 2x 30 sec low, mid, high Against wall scap squeeze + cervical retraction x10 Against wall glute med iso x10 Against wall "W" 2x10 Standing reactive iso row red TB 2x10 Standing shoulder ext red TB 2x10 Standing shoulder ext + L hip flexion 2x10 Against wall D2 shoulder flexion red TB 2x10   05/30/23 Standing L stretch x30 sec; with lateral flexion x30 sec R&L Lateral shift into wall 3x30 sec Prone  "I" with scap squeeze 2x10  "W" 2x10  "T" 2x10 Manual therapy  STM & TPR R thoracolumbar paraspinal Standing  Row red TB 2x10  Shoulder ext red TB 2x10  Against wall scap squeeze + cervical retraction 2x10  05/25/23 Seated: Hands behind head with scapular squeeze x10 for thoracic mobility and scapular strengthening Shoulder horizontal abd 2x10 red TB Side pull (elbows at side ER) red TB 2x10  Sash red TB 2x10  "W" red TB 2x10  Thoracic excursions with UE movements 5X each Supine:  decompression 2-5 5X each  Bridge 10X each  SLR 10X each with core stab  Lower trunk roations 10X  Butterflies with hands behind head 10X5'  05/22/23 Standing  Lateral hip shift x 30 sec  "L" stretch x 30 sec, with lateral flexion x30 sec R&L  Glute med iso set 10x3"  Cervical retraction against wall 10x3"  Row red TB 2x10  R SLS 5 sec, L SLS 10 sec Seated Thoracic extension into chair 2x30 sec Hands behind head with scapular squeeze 2x10 for thoracic mobility and scapular strengthening Shoulder horizontal abd 2x10 red TB Side pull red TB 2x10  Sash red TB 2x10  "W" red TB 2x10  05/19/23 Manual therapy  STM & TPR R thoracic paraspinal/QL Quadruped  Child's pose x30 sec, with lateral flexion x30 sec R&L each  Cat/cow 5x10"  "Thread the needle" 5x5" Standing  "L" stretch with lateral flexion x30"  Row red TB 2x10,  tactile cues to maintain proper spinal alignment  Shoulder ext red TB 2x10  Glute set iso into wall 10x3" Seated  TSA bracing + pball press down 10x5"  TSA bracing + contralateral arm and leg press into pball 10x5"   05/15/23 Seated  Scapular + cervical retraction 10x5"  Row with scap retraction  2x10 red TB  Lateral trunk flexion/stretch with pball x10  TSA bracing + pball press down 10x5"  TSA bracing + contralateral arm and leg press into pball 10x5" Supine  Shoulder horizontal abd red TB 2x10  Sash red TB 2x10  Side pull green TB 2x10  Hip hike x10 Standing  Hip hike x10 on step  L Lateral shift into wall 2x30"  Slow marching for balance 2x10 with UE support  05/12/23 Seated  Scapular retraction 10x5"  Cervical retraction 10x5"  Transversus abdominis bracing 10x5" Supine  Shoulder horizontal abd red TB 2x10  Side pull red TB 2x10  Sash red TB 2x10  Arm rotation red TB 2x10  TSA + diaphragmatic breaths x10  Bridge 2x10  05/10/23 Good body mechanics for bed mobility Decompression exercises 1-5 Theraband decompression exercises to include: The overhead, the side pull, Sash and arm rotation  x 10 each  Bridge x 10  Abdominal set x 10  Standing :  wall arch  Sitting: combine good posture with cervical and scapular retraction x 10    05/04/23   Correct Seated Posture   10 reps - 5" hold - Seated Cervical Retraction   10 reps - 5" hold - Seated Scapular Retraction   10 reps - 5" hold - Seated Transversus Abdominis Bracing 10 x 5"   PATIENT EDUCATION:  Education details: HEP updates. Self massage using ball to help with muscle spasm Person educated: Patient Education method: Explanation, Verbal cues, and Handouts Education comprehension: verbalized understanding and returned demonstration  HOME EXERCISE PROGRAM: Access Code: [facility] URL: [email or web address] Date: 06/01/2023 Prepared by: Vernon Prey April Kirstie Peri  Exercises - seated posture with  towel roll  - 1 x daily - 7 x weekly - 1 sets - 1 reps - Seated Upper Trapezius Stretch  - 2 x daily - 7 x weekly - 1 sets - 5 reps - 20 sec hold - Seated Cervical Rotation AROM  - 2 x daily - 7 x weekly - 1 sets - 10 reps - 5 sec hold - Seated Scapular Retraction  - 2 x daily - 7 x weekly - 1 sets - 10 reps - 5 sec hold - Seated Gentle Upper Trapezius Stretch  - 2 x daily - 7 x weekly - 1 sets - 3 reps - 20" hold - Shoulder extension with resistance - Neutral  - 1 x daily - 7 x weekly - 2 sets - 10 reps - Standing Shoulder Row Reactive Isometric  - 1 x daily - 7 x weekly - 2 sets - 10 reps - Resistance Pulldown with March  - 1 x daily - 7 x weekly - 2 sets - 10 reps - Isometric Gluteus Medius at Wall  - 1 x daily - 7 x weekly - 2 sets - 10 reps - Standing Shoulder Single Arm PNF D2 Flexion with Resistance  - 1 x daily - 7 x weekly - 2 sets - 10 reps  ASSESSMENT:  CLINICAL IMPRESSION:   Working on improving stability and posture in standing today. Pt is now able to perform SLS on R LE for >20 sec after cueing. Still requires verbal and tactile cues to decrease L side listing. Less muscle spasm noted in R thoracolumbar paraspinals.   OBJECTIVE IMPAIRMENTS: Abnormal gait, decreased activity tolerance, decreased balance, decreased mobility, difficulty walking, decreased ROM, decreased strength, increased fascial restrictions, postural dysfunction, and pain.   ACTIVITY LIMITATIONS: carrying, lifting, standing, squatting, stairs, and locomotion level  PARTICIPATION LIMITATIONS: meal  prep, cleaning, laundry, shopping, and community activity  PERSONAL FACTORS: Time since onset of injury/illness/exacerbation and 1-2 comorbidities: OA and osteopenia  are also affecting patient's functional outcome.   REHAB POTENTIAL: Good  CLINICAL DECISION MAKING: Evolving/moderate complexity  EVALUATION COMPLEXITY: Moderate   GOALS: Goals reviewed with patient? No  SHORT TERM GOALS: Target date:  05/25/23  Pt to be I in HEP in order to decrease her pain to no greater than a 6/10 Baseline: Goal status: on-going  2.  Pt to improve her core strength to be able to stand in an upright position  Baseline:  Goal status: on-going  3.  PT to be able to single leg stance for 10 seconds on her RT LE to decrease risk of falling  Baseline:  Goal status: MET (06/01/23)   LONG TERM GOALS: Target date: 06/15/23  Pt to be I in an advanced HEP in order to decrease her pain to no greater than a 3/10 Baseline:  Goal status: on-going  2.  Pt to improve her core strength to be able to stand/sit without listing to the left for 30 minutes. Baseline:  Goal status: on-going  3.  PT to be able to single leg stance for 20 seconds on her RT LE to decrease risk of falling Baseline:  Goal status: MET (06/01/23)   PLAN:  PT FREQUENCY: 2x/week  PT DURATION: 6 weeks  PLANNED INTERVENTIONS: Therapeutic exercises, Therapeutic activity, Balance training, Gait training, Patient/Family education, Self Care, and Manual therapy.  PLAN FOR NEXT SESSION:   Progress stab exercises as tolerated, improve posture.   Asir Bingley April Ma L Ailie Gage, PT 06/01/2023, 10:02 AM

## 2023-06-01 NOTE — Progress Notes (Signed)
 Because this visit was a virtual/telehealth visit,  certain criteria was not obtained, such a blood pressure, CBG if applicable, and timed get up and go. Any medications not marked as "taking" were not mentioned during the medication reconciliation part of the visit. Any vitals not documented were not able to be obtained due to this being a telehealth visit or patient was unable to self-report a recent blood pressure reading due to a lack of equipment at home via telehealth. Vitals that have been documented are verbally provided by the patient.   Subjective:   Megan Whitney is a 67 y.o. female who presents for an Initial Medicare Annual Wellness Visit.  Visit Complete: Virtual  I connected with  Megan Whitney on 06/01/23 by a audio enabled telemedicine application and verified that I am speaking with the correct person using two identifiers.  Patient Location: Home  Provider Location: Home Office  I discussed the limitations of evaluation and management by telemedicine. The patient expressed understanding and agreed to proceed.  Patient Medicare AWV questionnaire was completed by the patient on na; I have confirmed that all information answered by patient is correct and no changes since this date.  Cardiac Risk Factors include: advanced age (>48men, >66 women);dyslipidemia     Objective:    Today's Vitals   06/01/23 1322  Weight: 143 lb 1.6 oz (64.9 kg)  Height: 5' (1.524 m)   Body mass index is 27.95 kg/m.     06/01/2023    1:22 PM 05/04/2023    9:30 AM 07/20/2022    9:52 AM 05/11/2022    2:41 PM 05/09/2021    8:37 AM 01/19/2020    1:32 PM 01/19/2020   11:59 AM  Advanced Directives  Does Patient Have a Medical Advance Directive? Yes No Yes No Yes No No  Type of Estate agent of [place];Living will    Living will;Healthcare Power of Attorney    Does patient want to make changes to medical advance directive?     No - Patient declined    Copy of  Healthcare Power of Attorney in Chart? No - copy requested    No - copy requested    Would patient like information on creating a medical advance directive?  No - Patient declined  Yes (ED - Information included in AVS) No - Patient declined  No - Patient declined    Current Medications (verified) Outpatient Encounter Medications as of 06/01/2023  Medication Sig   acetaminophen (TYLENOL) 650 MG CR tablet Take 1,300 mg by mouth as needed for pain.   atorvastatin (LIPITOR) 20 MG tablet Take 1 tablet (20 mg total) by mouth every evening.   cloZAPine (CLOZARIL) 100 MG tablet Take 1 tablet (100 mg total) by mouth at bedtime.   dexmethylphenidate (FOCALIN) 10 MG tablet Take 1.5 tablets (15 mg total) by mouth 2 (two) times daily.   Ferrous Gluconate (IRON 27 PO) Take by mouth.   lamoTRIgine (LAMICTAL) 100 MG tablet Take 1 tablet (100 mg total) by mouth 2 (two) times daily.   Melatonin 10 MG CAPS Take by mouth at bedtime as needed.   pantoprazole (PROTONIX) 40 MG tablet TAKE (1) TABLET BY MOUTH TWICE DAILY.   valbenazine (INGREZZA) 60 MG capsule Take 1 capsule (60 mg total) by mouth 2 (two) times daily.   Vilazodone HCl (VIIBRYD) 40 MG TABS Take 1 tablet (40 mg total) by mouth daily.   albuterol (VENTOLIN HFA) 108 (90 Base) MCG/ACT inhaler Inhale 2 puffs into  the lungs every 4 (four) hours as needed for wheezing or shortness of breath. (Patient not taking: Reported on 06/01/2023)   ketoconazole (NIZORAL) 2 % cream Apply 1 Application topically daily. (Patient not taking: Reported on 06/01/2023)   promethazine-dextromethorphan (PROMETHAZINE-DM) 6.25-15 MG/5ML syrup Take 5 mLs by mouth 4 (four) times daily as needed. (Patient not taking: Reported on 06/01/2023)   No facility-administered encounter medications on file as of 06/01/2023.    Allergies (verified) Azithromycin, Penicillins, and Adhesive [tape]   History: Past Medical History:  Diagnosis Date   ADD (attention deficit disorder)    Allergy     Seasonal   Anemia    History of GI blood loss   Anxiety    Arthritis    Atrophy of vagina 10/07/2020   Bipolar 1 disorder (HCC)    Cancer (HCC)    Colon polyps    Depression    Diabetes mellitus (HCC)    pt denies   Edema, lower extremity    Epistaxis    Around 2011 or 2012, required cauterization.    Esophageal stricture    Fracture of superior pubic ramus (HCC) 11/28/2018   GERD (gastroesophageal reflux disease)    Headache(784.0)    Hyperlipidemia    Interstitial cystitis    Joint pain    Lactose intolerance    Lung cancer (HCC) 2002   Neuromuscular disorder (HCC)    Obesity    Osteoarthritis    Palpitations    Sleep apnea    Doesn't use a CPAP   Suicidal ideation 01/20/2020   Swallowing difficulty    Tardive dyskinesia    Past Surgical History:  Procedure Laterality Date   BALLOON DILATION  05/16/2012   Procedure: BALLOON DILATION;  Surgeon: Louis Meckel, MD;  Location: [facility] ENDOSCOPY;  Service: Endoscopy;  Laterality: N/A;   BUNIONECTOMY  2011   COLONOSCOPY     ENTEROSCOPY  05/16/2012   Procedure: ENTEROSCOPY;  Surgeon: Louis Meckel, MD;  Location: [facility] ENDOSCOPY;  Service: Endoscopy;  Laterality: N/A;   JOINT REPLACEMENT     right shoulder durgery 25 yrs ago  1988   TOTAL HIP ARTHROPLASTY Bilateral 2006, 2008   bilateral   TUBAL LIGATION  1990   WEDGE RESECTION  2002   lung cancer   Family History  Problem Relation Age of Onset   Arthritis Mother    Hearing loss Mother    Hyperlipidemia Mother    Hypertension Mother    Depression Mother    Anxiety disorder Mother    Obesity Mother    Sudden death Mother    Hypertension Father    Diabetes Mellitus II Father    Heart disease Father    Arthritis Father    Cancer Father        Brain   COPD Father    Diabetes Father    Hyperlipidemia Father    Sleep apnea Father    Early death Sister        Aneroxia/Bulimic   Depression Brother    Early death Hydrographic surveyor Accident   Stroke  Maternal Grandmother    Hypertension Maternal Grandmother    Arthritis Maternal Grandfather    Heart attack Maternal Grandfather    Hearing loss Maternal Grandfather    Depression Daughter    Drug abuse Daughter    Heart disease Daughter    Hypertension Daughter    Colon cancer Neg Hx    Esophageal cancer Neg Hx  Rectal cancer Neg Hx    Social History   Socioeconomic History   Marital status: Married    Spouse name: Not on file   Number of children: 1   Years of education: 12   Highest education level: Not on file  Occupational History   Occupation: retired  Tobacco Use   Smoking status: Never   Smokeless tobacco: Never  Vaping Use   Vaping status: Never Used  Substance and Sexual Activity   Alcohol use: Yes    Alcohol/week: 1.0 standard drink of alcohol    Types: 1 Glasses of wine per week    Comment: 1 glass wine q few weeks   Drug use: No   Sexual activity: Yes  Other Topics Concern   Not on file  Social History Narrative   Pt lives in [place] with husband Megan Whitney.  Followed by Dr. Jennelle Human for psychiatry and Rockne Menghini for therapy.   Right handed   Drinks caffeine   One story home   Married lives with husband   retired   International aid/development worker of Corporate investment banker Strain: Low Risk  (06/01/2023)   Overall Financial Resource Strain (CARDIA)    Difficulty of Paying Living Expenses: Not hard at all  Food Insecurity: No Food Insecurity (06/01/2023)   Hunger Vital Sign    Worried About Running Out of Food in the Last Year: Never true    Ran Out of Food in the Last Year: Never true  Transportation Needs: No Transportation Needs (06/01/2023)   PRAPARE - Administrator, Civil Service (Medical): No    Lack of Transportation (Non-Medical): No  Physical Activity: Inactive (06/01/2023)   Exercise Vital Sign    Days of Exercise per Week: 0 days    Minutes of Exercise per Session: 0 min  Stress: Stress Concern Present (06/01/2023)   [employer]  of Occupational Health - Occupational Stress Questionnaire    Feeling of Stress : Very much  Social Connections: Socially Integrated (06/01/2023)   Social Connection and Isolation Panel [NHANES]    Frequency of Communication with Friends and Family: More than three times a week    Frequency of Social Gatherings with Friends and Family: More than three times a week    Attends Religious Services: More than 4 times per year    Active Member of [employer] or Organizations: Yes    Attends Engineer, structural: More than 4 times per year    Marital Status: Married    Tobacco Counseling Counseling given: Yes   Clinical Intake:  Pre-visit preparation completed: Yes  Pain : No/denies pain     BMI - recorded: 27.95 Nutritional Status: BMI 25 -29 Overweight Nutritional Risks: None Diabetes: No  How often do you need to have someone help you when you read instructions, pamphlets, or other written materials from your doctor or pharmacy?: 1 - Never  Interpreter Needed?: No  Information entered by ::  Menelik Mcfarren, CMA   Activities of Daily Living    06/01/2023    1:40 PM  In your present state of health, do you have any difficulty performing the following activities:  Hearing? 0  Vision? 0  Difficulty concentrating or making decisions? 0  Walking or climbing stairs? 0  Dressing or bathing? 0  Doing errands, shopping? 0  Preparing Food and eating ? N  Using the Toilet? N  In the past six months, have you accidently leaked urine? N  Do you have problems with loss  of bowel control? N  Managing your Medications? N  Managing your Finances? N  Housekeeping or managing your Housekeeping? N    Patient Care Team: Anabel Halon, MD as PCP - General (Internal Medicine) Hilarie Fredrickson, MD as Consulting Physician (Gastroenterology) Cottle, Steva Ready., MD as Attending Physician (Psychiatry) Geryl Rankins, MD as Consulting Physician (Obstetrics and Gynecology)  Indicate any  recent Medical Services you may have received from other than [facility] providers in the past year (date may be approximate).     Assessment:   This is a routine wellness examination for Megan Whitney.  Hearing/Vision screen Hearing Screening - Comments:: Patient states she does have some difficulty with hearing. Wants a referral for a hearing test but states she can't afford hearing aids.  Vision Screening - Comments:: Wears rx glasses - She needs to call and schedule a yearly eye exam   Goals Addressed             This Visit's Progress    Patient Stated       I'd like to be more active and lose weight        Depression Screen    06/01/2023    1:28 PM 03/27/2023    1:14 PM 11/28/2022    3:46 PM 09/22/2022    1:43 PM 07/13/2022    8:22 AM 05/19/2022    2:15 PM 03/22/2022    1:23 PM  PHQ 2/9 Scores  PHQ - 2 Score 4 6 4 5 1 3 3   PHQ- 9 Score 14 19 16 15  3 18     Fall Risk    06/01/2023    1:39 PM 03/27/2023    1:14 PM 11/28/2022    3:46 PM 09/22/2022    1:43 PM 07/20/2022    9:52 AM  Fall Risk   Falls in the past year? 0 0 0 0 0  Number falls in past yr: 0 0 0 0 0  Injury with Fall? 0 0 0 0 0  Risk for fall due to : No Fall Risks      Follow up Falls prevention discussed    Falls evaluation completed    MEDICARE RISK AT HOME: Medicare Risk at Home Any stairs in or around the home?: Yes If so, are there any without handrails?: No Home free of loose throw rugs in walkways, pet beds, electrical cords, etc?: Yes Adequate lighting in your home to reduce risk of falls?: Yes Life alert?: No Use of a cane, walker or w/c?: No Grab bars in the bathroom?: Yes Shower chair or bench in shower?: Yes Elevated toilet seat or a handicapped toilet?: Yes  TIMED UP AND GO:  Was the test performed? No    Cognitive Function:    05/19/2022    4:11 PM 05/09/2021    8:39 AM  MMSE - Mini Mental State Exam  Not completed:  Unable to complete  Orientation to time 5   Orientation to Place 5    Registration 3   Attention/ Calculation 5   Recall 3   Language- name 2 objects 2   Language- repeat 1   Language- follow 3 step command 3   Language- read & follow direction 1   Write a sentence 1   Copy design 1   Total score 30         06/01/2023    1:26 PM 05/09/2021    8:39 AM  6CIT Screen  What Year? 0 points 0 points  What  month? 0 points 0 points  What time? 0 points 0 points  Count back from 20 0 points 0 points  Months in reverse 0 points 0 points  Repeat phrase 0 points 0 points  Total Score 0 points 0 points    Immunizations Immunization History  Administered Date(s) Administered   Fluad Quad(high Dose 65+) 08/04/2021, 05/19/2022   Influenza Split 06/19/2009, 06/01/2010, 05/17/2012, 06/04/2012, 06/30/2014, 07/15/2016   Influenza,inj,Quad PF,6+ Mos 06/24/2018, 05/01/2019, 06/14/2020   Influenza,trivalent, recombinat, inj, PF 06/03/2017   Moderna SARS-COV2 Booster Vaccination 01/14/2021   Moderna Sars-Covid-2 Vaccination 11/14/2019, 12/18/2019, 07/11/2020   PNEUMOCOCCAL CONJUGATE-20 05/25/2022   Pneumococcal Conjugate-13 06/05/2018   Td 06/29/2000, 07/01/2010   Tdap 09/05/2018   Zoster Recombinant(Shingrix) 06/08/2018, 08/25/2018    TDAP status: Up to date  Flu Vaccine status: Due, Education has been provided regarding the importance of this vaccine. Advised may receive this vaccine at local pharmacy or Health Dept. Aware to provide a copy of the vaccination record if obtained from local pharmacy or Health Dept. Verbalized acceptance and understanding.  Pneumococcal vaccine status: Up to date  Covid-19 vaccine status: Information provided on how to obtain vaccines.   Qualifies for Shingles Vaccine? Yes   Zostavax completed No   Shingrix Completed?: Yes  Screening Tests Health Maintenance  Topic Date Due   INFLUENZA VACCINE  04/06/2023   COVID-19 Vaccine (4 - 2023-24 season) 05/07/2023   Medicare Annual Wellness (AWV)  05/12/2023   Colonoscopy   09/18/2023   MAMMOGRAM  07/08/2024   DTaP/Tdap/Td (4 - Td or Tdap) 09/05/2028   Pneumonia Vaccine 41+ Years old  Completed   DEXA SCAN  Completed   Hepatitis C Screening  Completed   Zoster Vaccines- Shingrix  Completed   HPV VACCINES  Aged Out    Health Maintenance  Health Maintenance Due  Topic Date Due   INFLUENZA VACCINE  04/06/2023   COVID-19 Vaccine (4 - 2023-24 season) 05/07/2023   Medicare Annual Wellness (AWV)  05/12/2023   Colonoscopy  09/18/2023    Colorectal cancer screening: Referral to GI placed 06/01/2023. Pt aware the office will call re: appt.  Mammogram status: Ordered 06/01/2023. Pt provided with contact info and advised to call to schedule appt.   Bone Density status: Completed 07/08/2022. Results reflect: Bone density results: OSTEOPENIA. Repeat every 2 years.  Lung Cancer Screening: (Low Dose CT Chest recommended if Age 35-80 years, 20 pack-year currently smoking OR have quit w/in 15years.) does not qualify.   Additional Screening:  Hepatitis C Screening: does not qualify; Completed 05/18/2022  Vision Screening: Recommended annual ophthalmology exams for early detection of glaucoma and other disorders of the eye. Is the patient up to date with their annual eye exam?  Yes  Who is the provider or what is the name of the office in which the patient attends annual eye exams? [facility] If pt is not established with a provider, would they like to be referred to a provider to establish care? No .   Dental Screening: Recommended annual dental exams for proper oral hygiene  Diabetic Foot Exam: na  Community Resource Referral / Chronic Care Management: CRR required this visit?  No   CCM required this visit?  No     Plan:     I have personally reviewed and noted the following in the patient's chart:   Medical and social history Use of alcohol, tobacco or illicit drugs  Current medications and supplements including opioid prescriptions.  Patient is not currently  taking opioid prescriptions. Functional ability and status Nutritional status Physical activity Advanced directives List of other physicians Hospitalizations, surgeries, and ER visits in previous 12 months Vitals Screenings to include cognitive, depression, and falls Referrals and appointments  In addition, I have reviewed and discussed with patient certain preventive protocols, quality metrics, and best practice recommendations. A written personalized care plan for preventive services as well as general preventive health recommendations were provided to patient.     Jordan Hawks Corwin Kuiken, CMA   06/01/2023   After Visit Summary: (MyChart) Due to this being a telephonic visit, the after visit summary with patients personalized plan was offered to patient via MyChart   Nurse Notes: appt scheduled with pcp for Jun 13, 2023 to discuss constipation. Mammogram and GI referral entered.

## 2023-06-02 ENCOUNTER — Telehealth: Payer: Self-pay | Admitting: *Deleted

## 2023-06-02 NOTE — Progress Notes (Signed)
  Care Coordination   Note   06/02/2023 Name: Megan Whitney MRN: [id number] DOB: [birth year]  Megan Whitney is a 67 y.o. year old female who sees Anabel Halon, MD for primary care. I reached out to Oliver Hum by phone today to offer care coordination services.  [name] was given information about Care Coordination services today including:   The Care Coordination services include support from the care team which includes your Nurse Coordinator, Clinical Social Worker, or Pharmacist.  The Care Coordination team is here to help remove barriers to the health concerns and goals most important to you. Care Coordination services are voluntary, and the patient may decline or stop services at any time by request to their care team member.   Care Coordination Consent Status: Patient agreed to services and verbal consent obtained.   Follow up plan:  Telephone appointment with care coordination team member scheduled for:  06/15/23  Encounter Outcome:  Patient Scheduled [facility] Coordination Care Guide  Direct Dial: [phone]

## 2023-06-05 ENCOUNTER — Ambulatory Visit (HOSPITAL_COMMUNITY): Payer: No Typology Code available for payment source | Admitting: Physical Therapy

## 2023-06-05 DIAGNOSIS — M545 Low back pain, unspecified: Secondary | ICD-10-CM

## 2023-06-05 DIAGNOSIS — M6281 Muscle weakness (generalized): Secondary | ICD-10-CM

## 2023-06-05 NOTE — Therapy (Signed)
OUTPATIENT PHYSICAL THERAPY THORACOLUMBAR TREATMENT   Patient Name: Megan Whitney MRN: [id number] DOB:Jul 29, 1956, 67 y.o., female Today's Date: 06/05/2023  END OF SESSION:  PT End of Session - 06/05/23 1014     Visit Number 10    Number of Visits 12    Date for PT Re-Evaluation 06/15/23    Authorization Type devoted    Progress Note Due on Visit 10    PT Start Time 1015    PT Stop Time 1055    PT Time Calculation (min) 40 min    Activity Tolerance Patient tolerated treatment well    Behavior During Therapy [facility] for tasks assessed/performed               Past Medical History:  Diagnosis Date   ADD (attention deficit disorder)    Allergy    Seasonal   Anemia    History of GI blood loss   Anxiety    Arthritis    Atrophy of vagina 10/07/2020   Bipolar 1 disorder (HCC)    Cancer (HCC)    Colon polyps    Depression    Diabetes mellitus (HCC)    pt denies   Edema, lower extremity    Epistaxis    Around 2011 or 2012, required cauterization.    Esophageal stricture    Fracture of superior pubic ramus (HCC) 11/28/2018   GERD (gastroesophageal reflux disease)    Headache(784.0)    Hyperlipidemia    Interstitial cystitis    Joint pain    Lactose intolerance    Lung cancer (HCC) 2002   Neuromuscular disorder (HCC)    Obesity    Osteoarthritis    Palpitations    Sleep apnea    Doesn't use a CPAP   Suicidal ideation 01/20/2020   Swallowing difficulty    Tardive dyskinesia    Past Surgical History:  Procedure Laterality Date   BALLOON DILATION  05/16/2012   Procedure: BALLOON DILATION;  Surgeon: Louis Meckel, MD;  Location: [facility] ENDOSCOPY;  Service: Endoscopy;  Laterality: N/A;   BUNIONECTOMY  2011   COLONOSCOPY     ENTEROSCOPY  05/16/2012   Procedure: ENTEROSCOPY;  Surgeon: Louis Meckel, MD;  Location: [facility] ENDOSCOPY;  Service: Endoscopy;  Laterality: N/A;   JOINT REPLACEMENT     right shoulder durgery 25 yrs ago  1988   TOTAL HIP ARTHROPLASTY  Bilateral 2006, 2008   bilateral   TUBAL LIGATION  1990   WEDGE RESECTION  2002   lung cancer   Patient Active Problem List   Diagnosis Date Noted   DDD (degenerative disc disease), lumbar 03/27/2023   Class 1 obesity due to excess calories without serious comorbidity in adult 11/29/2022   Chronic thoracic back pain 09/22/2022   Neuroleptic-induced tardive dyskinesia 07/20/2022   Intertrigo 05/19/2022   Encounter for general adult medical examination with abnormal findings 03/22/2022   Arthritis of knee 08/17/2021   Polyp of colon 02/04/2021   Seasonal allergies 10/07/2020   Osteopenia 10/07/2020   Gait disturbance 10/07/2020   Hyperlipidemia 06/11/2020   Sleep apnea 06/11/2020   Stricture and stenosis of esophagus 05/16/2012   Hiatal hernia 05/16/2012   Dysphagia, unspecified(787.20) 05/15/2012   Depression    Bipolar disorder (HCC)    GERD (gastroesophageal reflux disease) 09/14/2010   Personal history of colonic polyps 09/14/2010    PCP: Trena Platt  REFERRING PROVIDER: Anabel Halon, MD  REFERRING DIAG: M51.36 (ICD-10-CM) - DDD (degenerative disc disease), lumbar  Rationale for Evaluation and  Treatment: Rehabilitation  THERAPY DIAG:  No diagnosis found.  ONSET DATE: chronic with acute exacerbation last month.    SUBJECTIVE:                                                                                                                                                                                           SUBJECTIVE STATEMENT:  Pt states she was using the ball this weekend and it helped a lot.    Eval:   Pt states that she is aware that she tends to sit and list to the left side but is unable to correct this.   Pt states that her pain is worse in the afternoon.  By late afternoon she can not be up longer than five minutes.  The pain is achy and is mainly on the right side of her back, the more she does the worse it hurts and it will radiate to the side.     PERTINENT HISTORY:   67 y.o. female with past medical history of bipolar disorder, ADD, anxiety, OSA, GERD, HLD and osteopenia and lumbar pain   PAIN:  Are you having pain? Yes: NPRS scale: 1/10, worst is an 7-8/10; best 0/10 in the mornings.  Pain location: Rt mid and lower back  Pain description: achy Aggravating factors: weight bearing Relieving factors: sitting , heating pad   PRECAUTIONS: None  RED FLAGS: None   WEIGHT BEARING RESTRICTIONS: No  FALLS:  Has patient fallen in last 6 months? No  LIVING ENVIRONMENT: Lives with: lives with their spouse Stairs: No Has following equipment at home: None  OCCUPATION: retired   PLOF: Independent with community mobility without device  PATIENT GOALS: less pain   NEXT MD VISIT: 6 months   OBJECTIVE:    PATIENT SURVEYS:  FOTO 43  SENSATION: WFL  POSTURE: rounded shoulders, decreased lumbar lordosis, increased thoracic kyphosis, and weight shift left   LUMBAR ROM:   AROM eval  Flexion Flexed at 10 degrees  Extension Able to go to neutral   Right lateral flexion   Left lateral flexion   Right rotation   Left rotation    (Blank rows = not tested)  *did not test forward flexion due to osteopenia. LOWER EXTREMITY MMT:    MMT Right eval Left eval  Hip flexion 5/5 4/5  Hip extension 4/5 4/5  Hip abduction 5/5 3+/5   Hip adduction    Hip internal rotation    Hip external rotation    Knee flexion 4/5 5/5  Knee extension 5/5 5/5  Ankle dorsiflexion 4/5 5/5  Ankle plantarflexion    Ankle inversion  Ankle eversion     (Blank rows = not tested)  FUNCTIONAL TESTS:  30 seconds chair stand test: 6 in 30 seconds (8 is poor for pt age and sex)  2 minute walk test: 394 feet pt leaning to the left while walking  Single leg stance on eval: Lt: 12, RT 0 06/01/23: Rt 10.49 sec, 23.36 sec   TODAY'S TREATMENT:                                                                                                                               DATE: 06/05/23 UBE L1, 2 min forward, 2 min backward Thoracic extension into chair x10 Doorway pec stretch x30 sec low, mid, high Against wall scap squeeze + cervical retraction x10 Against wall shoulder ER 2x10 Against wall "W" 2x10 Against wall glute med iso 2x10 Against wall glute max iso x10 Standing bow and arrow red TB 2x10 Standing shoulder ext red TB + alternating marching 2x10  06/01/23 UBE L1, 2 min forward, 2 min backward Thoracic extension into chair x10 Thoracic ext + rotation to R x10 Doorway pec stretch 2x 30 sec low, mid, high Against wall scap squeeze + cervical retraction x10 Against wall glute med iso x10 Against wall "W" 2x10 Standing reactive iso row red TB 2x10 Standing shoulder ext red TB 2x10 Standing shoulder ext + L hip flexion 2x10 Against wall D2 shoulder flexion red TB 2x10   05/30/23 Standing L stretch x30 sec; with lateral flexion x30 sec R&L Lateral shift into wall 3x30 sec Prone  "I" with scap squeeze 2x10  "W" 2x10  "T" 2x10 Manual therapy  STM & TPR R thoracolumbar paraspinal Standing  Row red TB 2x10  Shoulder ext red TB 2x10  Against wall scap squeeze + cervical retraction 2x10  05/25/23 Seated: Hands behind head with scapular squeeze x10 for thoracic mobility and scapular strengthening Shoulder horizontal abd 2x10 red TB Side pull (elbows at side ER) red TB 2x10  Sash red TB 2x10  "W" red TB 2x10  Thoracic excursions with UE movements 5X each Supine:  decompression 2-5 5X each  Bridge 10X each  SLR 10X each with core stab  Lower trunk roations 10X  Butterflies with hands behind head 10X5'  05/22/23 Standing  Lateral hip shift x 30 sec  "L" stretch x 30 sec, with lateral flexion x30 sec R&L  Glute med iso set 10x3"  Cervical retraction against wall 10x3"  Row red TB 2x10  R SLS 5 sec, L SLS 10 sec Seated Thoracic extension into chair 2x30 sec Hands behind head with scapular squeeze 2x10 for thoracic  mobility and scapular strengthening Shoulder horizontal abd 2x10 red TB Side pull red TB 2x10  Sash red TB 2x10  "W" red TB 2x10  05/19/23 Manual therapy  STM & TPR R thoracic paraspinal/QL Quadruped  Child's pose x30 sec, with lateral flexion x30 sec R&L each  Cat/cow 5x10"  "Thread the needle"  5x5" Standing  "L" stretch with lateral flexion x30"  Row red TB 2x10, tactile cues to maintain proper spinal alignment  Shoulder ext red TB 2x10  Glute set iso into wall 10x3" Seated  TSA bracing + pball press down 10x5"  TSA bracing + contralateral arm and leg press into pball 10x5"   05/15/23 Seated  Scapular + cervical retraction 10x5"  Row with scap retraction 2x10 red TB  Lateral trunk flexion/stretch with pball x10  TSA bracing + pball press down 10x5"  TSA bracing + contralateral arm and leg press into pball 10x5" Supine  Shoulder horizontal abd red TB 2x10  Sash red TB 2x10  Side pull green TB 2x10  Hip hike x10 Standing  Hip hike x10 on step  L Lateral shift into wall 2x30"  Slow marching for balance 2x10 with UE support  05/12/23 Seated  Scapular retraction 10x5"  Cervical retraction 10x5"  Transversus abdominis bracing 10x5" Supine  Shoulder horizontal abd red TB 2x10  Side pull red TB 2x10  Sash red TB 2x10  Arm rotation red TB 2x10  TSA + diaphragmatic breaths x10  Bridge 2x10  05/10/23 Good body mechanics for bed mobility Decompression exercises 1-5 Theraband decompression exercises to include: The overhead, the side pull, Sash and arm rotation  x 10 each  Bridge x 10  Abdominal set x 10  Standing :  wall arch  Sitting: combine good posture with cervical and scapular retraction x 10    05/04/23   Correct Seated Posture   10 reps - 5" hold - Seated Cervical Retraction   10 reps - 5" hold - Seated Scapular Retraction   10 reps - 5" hold - Seated Transversus Abdominis Bracing 10 x 5"   PATIENT EDUCATION:  Education details: HEP updates. Self massage  using ball to help with muscle spasm Person educated: Patient Education method: Explanation, Verbal cues, and Handouts Education comprehension: verbalized understanding and returned demonstration  HOME EXERCISE PROGRAM: Access Code: [facility] URL: [email or web address] Date: 06/01/2023 Prepared by: Vernon Prey April Kirstie Peri  Exercises - seated posture with towel roll  - 1 x daily - 7 x weekly - 1 sets - 1 reps - Seated Upper Trapezius Stretch  - 2 x daily - 7 x weekly - 1 sets - 5 reps - 20 sec hold - Seated Cervical Rotation AROM  - 2 x daily - 7 x weekly - 1 sets - 10 reps - 5 sec hold - Seated Scapular Retraction  - 2 x daily - 7 x weekly - 1 sets - 10 reps - 5 sec hold - Seated Gentle Upper Trapezius Stretch  - 2 x daily - 7 x weekly - 1 sets - 3 reps - 20" hold - Shoulder extension with resistance - Neutral  - 1 x daily - 7 x weekly - 2 sets - 10 reps - Standing Shoulder Row Reactive Isometric  - 1 x daily - 7 x weekly - 2 sets - 10 reps - Resistance Pulldown with March  - 1 x daily - 7 x weekly - 2 sets - 10 reps - Isometric Gluteus Medius at Wall  - 1 x daily - 7 x weekly - 2 sets - 10 reps - Standing Shoulder Single Arm PNF D2 Flexion with Resistance  - 1 x daily - 7 x weekly - 2 sets - 10 reps  ASSESSMENT:  CLINICAL IMPRESSION:   Continued to work on standing postural stability today. Improving glute activation. R LE fatigues when  cued to keep more weight through it. Improving thoracic excursion; however, pt requires continued cueing to maintain proper scapular positioning and to decreased lateral hip shift. She continues to make good progress towards her goals.    GOALS: Goals reviewed with patient? No  SHORT TERM GOALS: Target date: 05/25/23  Pt to be I in HEP in order to decrease her pain to no greater than a 6/10 Baseline: 06/05/23: 7-8/10 Goal status: on-going  2.  Pt to improve her core strength to be able to stand in an upright position  Baseline:   06/05/23: Able to perform with cueing Goal status: MET  3.  PT to be able to single leg stance for 10 seconds on her RT LE to decrease risk of falling  Baseline:  Goal status: MET (06/01/23)   LONG TERM GOALS: Target date: 06/15/23  Pt to be I in an advanced HEP in order to decrease her pain to no greater than a 3/10 Baseline:  Goal status: on-going  2.  Pt to improve her core strength to be able to stand/sit without listing to the left for 30 minutes. Baseline:  Goal status: on-going  3.  PT to be able to single leg stance for 20 seconds on her RT LE to decrease risk of falling Baseline:  Goal status: MET (06/01/23)   PLAN:  PT FREQUENCY: 2x/week  PT DURATION: 6 weeks  PLANNED INTERVENTIONS: Therapeutic exercises, Therapeutic activity, Balance training, Gait training, Patient/Family education, Self Care, and Manual therapy.  PLAN FOR NEXT SESSION:   Progress stab exercises as tolerated, improve posture.   Leilynn Pilat April Ma L Ehan Freas, PT 06/05/2023, 10:15 AM

## 2023-06-07 ENCOUNTER — Ambulatory Visit (HOSPITAL_COMMUNITY): Payer: No Typology Code available for payment source | Attending: Internal Medicine | Admitting: Physical Therapy

## 2023-06-07 ENCOUNTER — Other Ambulatory Visit: Payer: Self-pay | Admitting: Psychiatry

## 2023-06-07 ENCOUNTER — Encounter (HOSPITAL_COMMUNITY): Payer: Self-pay | Admitting: Physical Therapy

## 2023-06-07 DIAGNOSIS — M545 Low back pain, unspecified: Secondary | ICD-10-CM | POA: Diagnosis not present

## 2023-06-07 DIAGNOSIS — M6281 Muscle weakness (generalized): Secondary | ICD-10-CM | POA: Diagnosis not present

## 2023-06-07 DIAGNOSIS — F3132 Bipolar disorder, current episode depressed, moderate: Secondary | ICD-10-CM

## 2023-06-07 NOTE — Telephone Encounter (Signed)
Appt 06/08/23; lv note states "Extensive discussion of clozapine dosing recommendations but we will increase more slowly bc she is so med sensitive.Disc risk low WBC, cardiomyopathy, etc, sedation  (Started clozapine on 12/07/21)"  Would not rf,  in case a change is made tomorrow.

## 2023-06-07 NOTE — Therapy (Signed)
OUTPATIENT PHYSICAL THERAPY THORACOLUMBAR TREATMENT   Patient Name: Megan Whitney MRN: [id number] DOB:Jan 15, 1956, 67 y.o., female Today's Date: 06/07/2023  END OF SESSION:  PT End of Session - 06/07/23 1024     Visit Number 11    Number of Visits 12    Date for PT Re-Evaluation 06/15/23    Authorization Type devoted    Progress Note Due on Visit 10    PT Start Time 1020    PT Stop Time 1100    PT Time Calculation (min) 40 min    Activity Tolerance Patient tolerated treatment well    Behavior During Therapy [facility] for tasks assessed/performed                Past Medical History:  Diagnosis Date   ADD (attention deficit disorder)    Allergy    Seasonal   Anemia    History of GI blood loss   Anxiety    Arthritis    Atrophy of vagina 10/07/2020   Bipolar 1 disorder (HCC)    Cancer (HCC)    Colon polyps    Depression    Diabetes mellitus (HCC)    pt denies   Edema, lower extremity    Epistaxis    Around 2011 or 2012, required cauterization.    Esophageal stricture    Fracture of superior pubic ramus (HCC) 11/28/2018   GERD (gastroesophageal reflux disease)    Headache(784.0)    Hyperlipidemia    Interstitial cystitis    Joint pain    Lactose intolerance    Lung cancer (HCC) 2002   Neuromuscular disorder (HCC)    Obesity    Osteoarthritis    Palpitations    Sleep apnea    Doesn't use a CPAP   Suicidal ideation 01/20/2020   Swallowing difficulty    Tardive dyskinesia    Past Surgical History:  Procedure Laterality Date   BALLOON DILATION  05/16/2012   Procedure: BALLOON DILATION;  Surgeon: Louis Meckel, MD;  Location[facility] ENDOSCOPY;  Service: Endoscopy;  Laterality: N/A;   BUNIONECTOMY  2011   COLONOSCOPY     ENTEROSCOPY  05/16/2012   Procedure: ENTEROSCOPY;  Surgeon: Louis Meckel, MD;  Location: [facility] ENDOSCOPY;  Service: Endoscopy;  Laterality: N/A;   JOINT REPLACEMENT     right shoulder durgery 25 yrs ago  1988   TOTAL HIP ARTHROPLASTY  Bilateral 2006, 2008   bilateral   TUBAL LIGATION  1990   WEDGE RESECTION  2002   lung cancer   Patient Active Problem List   Diagnosis Date Noted   DDD (degenerative disc disease), lumbar 03/27/2023   Class 1 obesity due to excess calories without serious comorbidity in adult 11/29/2022   Chronic thoracic back pain 09/22/2022   Neuroleptic-induced tardive dyskinesia 07/20/2022   Intertrigo 05/19/2022   Encounter for general adult medical examination with abnormal findings 03/22/2022   Arthritis of knee 08/17/2021   Polyp of colon 02/04/2021   Seasonal allergies 10/07/2020   Osteopenia 10/07/2020   Gait disturbance 10/07/2020   Hyperlipidemia 06/11/2020   Sleep apnea 06/11/2020   Stricture and stenosis of esophagus 05/16/2012   Hiatal hernia 05/16/2012   Dysphagia 05/15/2012   Depression    Bipolar disorder (HCC)    GERD (gastroesophageal reflux disease) 09/14/2010   History of colonic polyps 09/14/2010    PCP: Trena Platt  REFERRING PROVIDER: Anabel Halon, MD  REFERRING DIAG: M51.36 (ICD-10-CM) - DDD (degenerative disc disease), lumbar  Rationale for Evaluation and Treatment:  Rehabilitation  THERAPY DIAG:  Lumbar pain  Muscle weakness (generalized)  ONSET DATE: chronic with acute exacerbation last month.    SUBJECTIVE:                                                                                                                                                                                           SUBJECTIVE STATEMENT:  Pt reports she tried to lift the edge of her mattress while making the bed and exacerbated her back yesterday. Did the exercises which made it feel better for a little bit but the pain came back. Pain was primarily more along the left side of her low back. Used heat and rested and feels better today.   Eval:   Pt states that she is aware that she tends to sit and list to the left side but is unable to correct this.   Pt states that her pain  is worse in the afternoon.  By late afternoon she can not be up longer than five minutes.  The pain is achy and is mainly on the right side of her back, the more she does the worse it hurts and it will radiate to the side.    PERTINENT HISTORY:   67 y.o. female with past medical history of bipolar disorder, ADD, anxiety, OSA, GERD, HLD and osteopenia and lumbar pain   PAIN:  Are you having pain? Yes: NPRS scale: 1/10, worst is an 7-8/10; best 0/10 in the mornings.  Pain location: Rt mid and lower back  Pain description: achy Aggravating factors: weight bearing Relieving factors: sitting , heating pad   PRECAUTIONS: None  RED FLAGS: None   WEIGHT BEARING RESTRICTIONS: No  FALLS:  Has patient fallen in last 6 months? No  LIVING ENVIRONMENT: Lives with: lives with their spouse Stairs: No Has following equipment at home: None  OCCUPATION: retired   PLOF: Independent with community mobility without device  PATIENT GOALS: less pain   NEXT MD VISIT: 6 months   OBJECTIVE:    PATIENT SURVEYS:  FOTO 43  SENSATION: WFL  POSTURE: rounded shoulders, decreased lumbar lordosis, increased thoracic kyphosis, and weight shift left   LUMBAR ROM:   AROM eval  Flexion Flexed at 10 degrees  Extension Able to go to neutral   Right lateral flexion   Left lateral flexion   Right rotation   Left rotation    (Blank rows = not tested)  *did not test forward flexion due to osteopenia. LOWER EXTREMITY MMT:    MMT Right eval Left eval  Hip flexion 5/5 4/5  Hip extension 4/5 4/5  Hip abduction 5/5 3+/5  Hip adduction    Hip internal rotation    Hip external rotation    Knee flexion 4/5 5/5  Knee extension 5/5 5/5  Ankle dorsiflexion 4/5 5/5  Ankle plantarflexion    Ankle inversion    Ankle eversion     (Blank rows = not tested)  FUNCTIONAL TESTS:  30 seconds chair stand test: 6 in 30 seconds (8 is poor for pt age and sex)  2 minute walk test: 394 feet pt leaning to  the left while walking  Single leg stance on eval: Lt: 12, RT 0 06/01/23: Rt 10.49 sec, 23.36 sec   TODAY'S TREATMENT:                                                                                                                              DATE: 06/07/23 UBE L1, 2 min forward, 2 min backward Thoracic extension into chair x10 Doorway pec stretch x30 sec low, mid, high Deadlift red TB 2x10 Modified stagged stance dead lift 5# handweight on 12" step  Shoulder ext green TB + marching x10 Shoulder ext green TB x10   06/05/23 UBE L1, 2 min forward, 2 min backward Thoracic extension into chair x10 Doorway pec stretch x30 sec low, mid, high Against wall scap squeeze + cervical retraction x10 Against wall shoulder ER 2x10 Against wall "W" 2x10 Against wall glute med iso 2x10 Against wall glute max iso x10 Standing bow and arrow red TB 2x10 Standing shoulder ext red TB + alternating marching 2x10  06/01/23 UBE L1, 2 min forward, 2 min backward Thoracic extension into chair x10 Thoracic ext + rotation to R x10 Doorway pec stretch 2x 30 sec low, mid, high Against wall scap squeeze + cervical retraction x10 Against wall glute med iso x10 Against wall "W" 2x10 Standing reactive iso row red TB 2x10 Standing shoulder ext red TB 2x10 Standing shoulder ext + L hip flexion 2x10 Against wall D2 shoulder flexion red TB 2x10   05/30/23 Standing L stretch x30 sec; with lateral flexion x30 sec R&L Lateral shift into wall 3x30 sec Prone  "I" with scap squeeze 2x10  "W" 2x10  "T" 2x10 Manual therapy  STM & TPR R thoracolumbar paraspinal Standing  Row red TB 2x10  Shoulder ext red TB 2x10  Against wall scap squeeze + cervical retraction 2x10  05/25/23 Seated: Hands behind head with scapular squeeze x10 for thoracic mobility and scapular strengthening Shoulder horizontal abd 2x10 red TB Side pull (elbows at side ER) red TB 2x10  Sash red TB 2x10  "W" red TB 2x10  Thoracic excursions  with UE movements 5X each Supine:  decompression 2-5 5X each  Bridge 10X each  SLR 10X each with core stab  Lower trunk roations 10X  Butterflies with hands behind head 10X5'  05/22/23 Standing  Lateral hip shift x 30 sec  "L" stretch x 30 sec, with lateral flexion x30 sec R&L  Glute med iso set  10x3"  Cervical retraction against wall 10x3"  Row red TB 2x10  R SLS 5 sec, L SLS 10 sec Seated Thoracic extension into chair 2x30 sec Hands behind head with scapular squeeze 2x10 for thoracic mobility and scapular strengthening Shoulder horizontal abd 2x10 red TB Side pull red TB 2x10  Sash red TB 2x10  "W" red TB 2x10  05/19/23 Manual therapy  STM & TPR R thoracic paraspinal/QL Quadruped  Child's pose x30 sec, with lateral flexion x30 sec R&L each  Cat/cow 5x10"  "Thread the needle" 5x5" Standing  "L" stretch with lateral flexion x30"  Row red TB 2x10, tactile cues to maintain proper spinal alignment  Shoulder ext red TB 2x10  Glute set iso into wall 10x3" Seated  TSA bracing + pball press down 10x5"  TSA bracing + contralateral arm and leg press into pball 10x5"   05/15/23 Seated  Scapular + cervical retraction 10x5"  Row with scap retraction 2x10 red TB  Lateral trunk flexion/stretch with pball x10  TSA bracing + pball press down 10x5"  TSA bracing + contralateral arm and leg press into pball 10x5" Supine  Shoulder horizontal abd red TB 2x10  Sash red TB 2x10  Side pull green TB 2x10  Hip hike x10 Standing  Hip hike x10 on step  L Lateral shift into wall 2x30"  Slow marching for balance 2x10 with UE support  05/12/23 Seated  Scapular retraction 10x5"  Cervical retraction 10x5"  Transversus abdominis bracing 10x5" Supine  Shoulder horizontal abd red TB 2x10  Side pull red TB 2x10  Sash red TB 2x10  Arm rotation red TB 2x10  TSA + diaphragmatic breaths x10  Bridge 2x10  05/10/23 Good body mechanics for bed mobility Decompression exercises 1-5 Theraband  decompression exercises to include: The overhead, the side pull, Sash and arm rotation  x 10 each  Bridge x 10  Abdominal set x 10  Standing :  wall arch  Sitting: combine good posture with cervical and scapular retraction x 10    05/04/23   Correct Seated Posture   10 reps - 5" hold - Seated Cervical Retraction   10 reps - 5" hold - Seated Scapular Retraction   10 reps - 5" hold - Seated Transversus Abdominis Bracing 10 x 5"   PATIENT EDUCATION:  Education details: HEP updates. Self massage using ball to help with muscle spasm Person educated: Patient Education method: Explanation, Verbal cues, and Handouts Education comprehension: verbalized understanding and returned demonstration  HOME EXERCISE PROGRAM: Access Code: [facility] URL: [email or web address] Date: 06/01/2023 Prepared by: Vernon Prey April Kirstie Peri  Exercises - seated posture with towel roll  - 1 x daily - 7 x weekly - 1 sets - 1 reps - Seated Upper Trapezius Stretch  - 2 x daily - 7 x weekly - 1 sets - 5 reps - 20 sec hold - Seated Cervical Rotation AROM  - 2 x daily - 7 x weekly - 1 sets - 10 reps - 5 sec hold - Seated Scapular Retraction  - 2 x daily - 7 x weekly - 1 sets - 10 reps - 5 sec hold - Seated Gentle Upper Trapezius Stretch  - 2 x daily - 7 x weekly - 1 sets - 3 reps - 20" hold - Shoulder extension with resistance - Neutral  - 1 x daily - 7 x weekly - 2 sets - 10 reps - Standing Shoulder Row Reactive Isometric  - 1 x daily - 7 x weekly -  2 sets - 10 reps - Resistance Pulldown with March  - 1 x daily - 7 x weekly - 2 sets - 10 reps - Isometric Gluteus Medius at Wall  - 1 x daily - 7 x weekly - 2 sets - 10 reps - Standing Shoulder Single Arm PNF D2 Flexion with Resistance  - 1 x daily - 7 x weekly - 2 sets - 10 reps  ASSESSMENT:  CLINICAL IMPRESSION:   Went through lifting mechanics today to prevent further back injury. Requires verbal and tactile cueing to keep hips forward, hinging at the  hips and decrease rotation to the left. Session primarily focused on glute strengthening and maintain thoracic mobility. Able to self correct left listing but requires verbal cueing still.    GOALS: Goals reviewed with patient? No  SHORT TERM GOALS: Target date: 05/25/23  Pt to be I in HEP in order to decrease her pain to no greater than a 6/10 Baseline: 06/05/23: 7-8/10 Goal status: on-going  2.  Pt to improve her core strength to be able to stand in an upright position  Baseline:  06/05/23: Able to perform with cueing Goal status: MET  3.  PT to be able to single leg stance for 10 seconds on her RT LE to decrease risk of falling  Baseline:  Goal status: MET (06/01/23)   LONG TERM GOALS: Target date: 06/15/23  Pt to be I in an advanced HEP in order to decrease her pain to no greater than a 3/10 Baseline:  Goal status: on-going  2.  Pt to improve her core strength to be able to stand/sit without listing to the left for 30 minutes. Baseline:  Goal status: on-going  3.  PT to be able to single leg stance for 20 seconds on her RT LE to decrease risk of falling Baseline:  Goal status: MET (06/01/23)   PLAN:  PT FREQUENCY: 2x/week  PT DURATION: 6 weeks  PLANNED INTERVENTIONS: Therapeutic exercises, Therapeutic activity, Balance training, Gait training, Patient/Family education, Self Care, and Manual therapy.  PLAN FOR NEXT SESSION:   Progress stab exercises as tolerated, improve posture.   Darragh Nay April Ma L Ruqayyah Lute, PT 06/07/2023, 10:24 AM

## 2023-06-08 ENCOUNTER — Encounter: Payer: Self-pay | Admitting: Psychiatry

## 2023-06-08 ENCOUNTER — Ambulatory Visit: Payer: No Typology Code available for payment source | Admitting: Psychiatry

## 2023-06-08 DIAGNOSIS — G3184 Mild cognitive impairment, so stated: Secondary | ICD-10-CM | POA: Diagnosis not present

## 2023-06-08 DIAGNOSIS — G2401 Drug induced subacute dyskinesia: Secondary | ICD-10-CM

## 2023-06-08 DIAGNOSIS — F5105 Insomnia due to other mental disorder: Secondary | ICD-10-CM | POA: Diagnosis not present

## 2023-06-08 DIAGNOSIS — F900 Attention-deficit hyperactivity disorder, predominantly inattentive type: Secondary | ICD-10-CM

## 2023-06-08 DIAGNOSIS — F411 Generalized anxiety disorder: Secondary | ICD-10-CM

## 2023-06-08 DIAGNOSIS — Z79899 Other long term (current) drug therapy: Secondary | ICD-10-CM | POA: Diagnosis not present

## 2023-06-08 DIAGNOSIS — F3132 Bipolar disorder, current episode depressed, moderate: Secondary | ICD-10-CM | POA: Diagnosis not present

## 2023-06-08 DIAGNOSIS — R251 Tremor, unspecified: Secondary | ICD-10-CM

## 2023-06-08 MED ORDER — VALBENAZINE TOSYLATE 60 MG PO CAPS: 60.0000 mg | ORAL_CAPSULE | Freq: Two times a day (BID) | ORAL | 1 refills | Status: DC

## 2023-06-08 MED ORDER — CLOZAPINE 100 MG PO TABS: 100.0000 mg | ORAL_TABLET | Freq: Every day | ORAL | 1 refills | Status: DC

## 2023-06-08 MED ORDER — VILAZODONE HCL 40 MG PO TABS: 40.0000 mg | ORAL_TABLET | Freq: Every day | ORAL | 0 refills | Status: DC

## 2023-06-08 MED ORDER — LAMOTRIGINE 100 MG PO TABS: 100.0000 mg | ORAL_TABLET | Freq: Two times a day (BID) | ORAL | 0 refills | Status: DC

## 2023-06-08 NOTE — Progress Notes (Signed)
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 having mouth movements and biting tongue.  Sometimes hard to talk.  Drooling.  When tries to increase clozapine slurred speech and severe dizziness.   Mood is a little better.   Sleeping 8-9 hours.  So much better sleep with clozapine.   Still has anxiety, generalized. Plan: To minimize polypharmacy and improve tolerabilty:  Reduce Equetro to 1 of the 100 mg capsule at night for 1 week, then stop it. Wait 1 week then stop the carbamazepine chewable. Wait 1 more week then reduce clonidine to 1/2 at night for 1 week then stop it. Plan: Started clozapine  and continue 25 mg for now bc hasn't tolerated more so far.  02/08/22 appt noted: Mouth movements and biting tongue.  Sores on cheek with constant chewing movements. Sometimes hard to talk. Hypersalivation gets worse as day progresses. Sometimes balance problems.  Constipation managed.   Sleep very well.  8-9 hours. Off Equetro and on clozapine. Still has depression and anxiety without much change Plan: Started clozapine  and continue 25 mg for now bc hasn't tolerated more and need to start Ingrezza 40 mg for  TD.  03/23/2022 appointment with the following noted: Several phone calls since here.  Has gotten up to clozapine 37.5 mg nightly. Continues Focalin 15 mg twice daily, lamotrigine 200 mg twice daily, lithium 150 nightly. Started Ingrezza 40 mg daily. Ingrezza amazing difference but even with grant of $10000 can't afford it.  Not biting mouth and mouth less sore.  Less mouth movements but not gone Emotionally not real well with anxiety and depression and crying spells.  Also some irritability and anger.  Easily triggered anger. Sleep more broken with Ingrezza HS but 8-9 hours. Can be sedated if gets up early with slurred speech but not if full night sleep. Balance better off Equetro. Plan: Started clozapine but needs to increase bc minimal effect and better tolerance, so increase to 50 mg HS  04/05/22 appt noted: Increased clozapine to 50 mg HS.  Some groggy in the AM.  One day was dizzy.  Otherwise on occasion.  Takes it right before bed.   Still depression, hopeless, irritability and anger.  Some periods of racing thoughts.  Sometimes recognizes hypomanic episodes and somethimes doesn't  recognize. Dog is very sick and H with bone CA.  Not crying on clozapine as much. GERD and needs surgery for hiatal hernia. Signed up for water aerobics. Plan: Started clozapine but needs to increase bc minimal effect and better tolerance, so increase to 75 mg HS (Started clozapine on 12/07/21) Reduce librium 5 mg HS and plan to stop  05/10/22 appt med: TD partially better with Ingrezza 40.  Has  a grant.   Increased clozapine 75 mg HS, reduced Librium to 5 mg HS. Tolerated OK. Depression a little better.  Irritability still high.  Poor memory and easily confused. Sleep is pretty good and is better and needs to sleep longer.   Plan: DC librium Worsening TD partial response on 40 mg Ingrezza, increase to 60 mg daily   Continue clozapine 100 mg HS  05/17/2022 phone call complaining of sleeping more and feeling  sleepy and foggy thinking also dropping some things and drooling.  She was instructed to reduce the clozapine to 75 mg nightly to see if that was the problem. 05/23/2022 phone call asking to increase Ingrezza from 60 to 80 mg daily.  It was agreed. 06/16/2022 phone call stating she had a bad manic episode the week prior and is still feeling excessively sedated.  Also having hand tremors. Instructed to stop lithium and continue clozapine 75 mg nightly.  She is very med sensitive but we have few options left to treat her unstable bipolar disorder. 06/21/2022 phone call: After complaining of excessive sleepiness and excessive sleeping she is now complaining of insomnia.  07/07/22 appt noted: Current psychiatric medications include clozapine 75 mg nightly, Focalin 15 mg twice daily, lamotrigine 100 mg twice daily Ingrezza 80 mg daily.  She stopped lithium Has a list of concerns: SE drooling bad.   Still have mouth movements with the increase Ingrezza 80 mg daily but has stopped the tongue chewing. Goes to bed 9 PM and to sleep in 30 mins and awaken in the AM about 830 and hard to wake up.  Not napping in the day.  Getting enough sleep.   Notices Focalin kicking in when takes it. Mood depressed but not as bad.  Still some irritability, anger.  3 week ago bad manic anger lasting 3-4 days.  Not sure how her sleep was at the time. Some crying and poor impulse control.   PCP wanted 2nd opinion from neuro on ? PD, Dr. Arbutus Leas Nov 15. Plan: No med changes pending neurologic appointment  07/20/2022 neurology appointment Dr. Lurena Joiner Tat.  Diagnosed TD.  Assessment as follows: 1.  Tardive dyskinesia -The patients symptoms are most consistent with tardive dyskinesia.  She has had exposure to typical and atypical antipsychotic medication.  TD is a heterogeneous syndrome depending on a subtle balance between several neurotransmitters in the brain, including DA receptor blockade and hypersensitivity of DA and GABA  receptors. -pt on ingrezza since 02/2022 and both she and notes from Dr. Jennelle Human indicate great benefit.  2.  Tremor             -Largely resolved off of lithium and vraylar (vraylar d/c in 12/2021)             -She has very minor left hand tremor.  Did tell her that Ingrezza can occasionally cause parkinsonism, but I did not see a significant degree of that today.             -I did reassure her today that I saw no evidence of idiopathic  Parkinson's disease.  She was reassured.  3.  Bipolar d/o             -difficult to control per records             -sees Dr. Jennelle Human frequently  4.  Discussed with patient that she really does not need neurologic follow-up at this point in time.  She was happy to hear this.  08/08/2022 appointment noted: No med changes. Still having a lot of anxiety and worries too much. Worse than depression.  No mania since here.  No sig avoidance.   Hard time concentration. Still having irritability and anger. SE drowsy with clozapine in AM and hard to function until about 10 AM.  Takes it about 8 PM and then goes to bed.   No falling but is shuffling more since here.   Sleep 10 hours.    09/07/22 appt noted:   Consistently on clozapine 75 mg HS.  Too drowsy if takes 25 mg in AM. Doing so so .  A lot of anxiety, anger, irritation.   Avg 8-9 hours of sleep and pushes herself to get up .  Hard to function in am until 11 or 12 noon. Ingreza 40 mg BID still some mouth movements and biting tongue.  Is better with Ingrezza but not gone.   SE consitpation and drowsy.   She is aware of the difficulty finding balance between aenough med to manage her sx and not so much to cause intolerable SE.  Disc dosing of her meds. Plan: Augment clozapine with fluvoxamine 25 mg HS  10/11/22 appt noted: : Current psych meds: Clozapine 75 mg nightly, Focalin 15 mg twice daily, fluvoxamine 25 mg nightly, lamotrigine 100 mg twice daily, Ingrezza 40 mg twice daily No noticeable change with  fluvoxamine. Drooling worse in am and stupor until about 11 AM.   Mood still not good , angry and irritable a lot.  H acuses he rof going off her meds.  Less mouth movements and biting tonue. Sleep 9-10 hours. Anxiety still through the roof. Tremor better right now unless weak.   H prostate CA with bone mets and on pain meds. Plan: Augment clozapine with fluvoxamine but increase 50mg  HS also to help anxiety, irritability  11/09/2022 appointment noted: Added fluvoxamine 50 mg HS.  And is less anxious and more grounded.  Half as irritable as before fluvoxamine.   Down side is shuffling steps seem worse.  Not dizzy usually but balance isn't good.  Gets better after the day progresses.    Overall does feel improved.   Trembling better and mouth movements much better but drooling is bad nothing seems to help that. Drools in public is embarrassing. Tired a lot better as day progresses.   Plan: Augment clozapine with fluvoxamine but REDUCE TO 37.5 mg mg HS bc more shuffling of feet .  And reduced clozapine to 50 mg daily.  But did help anxiety, irritability Ingrezza for TD helped stop tongue chewing but still some mouth movements at 80 mg, which was started 05/23/22.  Shuffling a little more and can't reduce Ingrezza.  Split ingrezza 40 BID  12/12/22 appt noted: Made changes as above.  Asks about increasing Focalin 20 BID bc lack of energy and motivation and productivity.  No SE.   No jittery,  HA. Usally sleep Is good but not always. Still shuffling if gets up at night or before morning Focalin.  Then it clears up.  No change in shuffling since  here last visit.  Other day used H's walker.  Like losing balance.   Mouth movements still there but not nearly as bad.  Worse if forgets a dose of Ingrezza.   Mood depressed and more anxious than last visit.  Constantly obsessive thinking about things that could go wrong.  More short tempered.  Impaitent at home only. H got bad report from onc that current  chemo not workingand it is changed with limited success rate.  This affects her mood too.   No change in sleep. Plan: Plan: First 2 weeks reduce Ingrezza to 60 mg at night to see if shuffling is better with samples. If shuffling is better call office for change in RX If so then increase clozapine back to 3 of the 25 mg capsules and fluvoxamine back to 50 mg .  12/19/22 TC with nurse: Lambert Keto, LPN   TS   12/13/79 11:16 AM Note Pt was in to see her therapist, Rockne Menghini today and asked to speak with nurse. She reports having apt with Dr. Jennelle Human last week on April 8th, and he reduced her Ingrezza to 60 mg, she feels that the issue isn't coming from that but the Fluvoxamine that she was put on a few months back. She reports she may not have explained herself well enough at the apt. She reports when she wakes up during the night she has to shuffle and hold on to the wall to keep from falling. She reports her husband has cancer and she needs to be more alert and able to function in case of emergency with him.  She reports nothing changed with the decrease in that medication.    Informed her I would discuss with Dr. Jennelle Human and get back with her.     12/19/22 MD resp:  Rip Harbour.  Reduce fluvoxamine from 1 and 1/2 tablets at night to 1/2 tablet twice daily.  Split dose to reduce SE      01/09/23 appt noted: Meds: clozapine 50 mg HS, reduced fluvoxamine 12.5 mg Am, forcalin 15 BID, Ingrezza 40 BID, lamotrigine 100 BID,  Not doing well.  Dep and high anxiety.  Some irritability.  Mood swings not dramatic.  No SI. Balance issues when up in middle of night.  Does better once takes morning meds with focalin.   Sometimes forgets afternoon Focalin. Mouth movements still there but better.  Drooling stopped. A lot of things seem like too much working.  Still some wiggling toes and may chew side of mouth , not severe. Plan: Plan: DC fluvoxamine Trial Viibryd 5 mg daily for 1 week then 10 mg  daily.  02/06/23 appt noted: Meds:  viibryd 10 HS, clozapine 50 mg HS, off  fluvoxamine,  focalin 15 BID, Ingrezza 40 BID, lamotrigine 100 BID,  Walking better at night wihout fluvoxamine and less dep but mood swings and anxiety through the roof.   Seems higher than last time.  Worry about everything.   Reduced appetite.  Some am nausea.   Upset with H's drinking.  He has terminal CA, prostate stage 4 mets to bones.  Hared living with someone terminal and also alcoholic.  Living with a lot of stress.   Less falling.   Mouth movements seem worse.  No drooling. Plan: increase Clozapine 62.5 mg HS for a week for mood swings and anxity and if fails but no SE then increase to 75 mg HS. Split ingrezza but increase bc TD worse lately to 60 mg BID  03/14/23 appt Noted: Increased clozapine to 75 mg HS. No SE except sedating at night.   Anxiety is still high but dep seems better.  Worry over H's CA and hosp last weekend.  Lots of medical bills.  General worry mostly about H's CA and alcoholism. Couple mood swings and racing obsessive thinking briefly. Sleeping well.  No SI Loss of appetite.   Plan: Increase to  Viibryd 15 mg daily for anxiety.   04/10/23 appt noted: Psych meds:  viibryd incr to 20 HS 2 weeks, clozapine 75 mg HS,  focalin usually 20 AM, Ingrezza 60 BID, lamotrigine 100 BID,  Noticed improvement in dep with incr Viibryd 20 mg without much change in anxiety.  Stress level is still high also.  H health getting worse and still drinking and fears having to call EMT bc of this. Taking Viibryd with food. Still some dizziness but no falls. Sleep is good.  No major mood swings. Still in counseling  and working on anxiety and low confidence. Worries over having to take care of finances.   Some racing thoughts in brief spells. Wants better cotrol of anxiety with constant worry and still irritable. Plan: For anxiety and irritability, reactivity incr clozapine to 100 mg HS  05/09/23 appt noted: Psych  med: Clozapine 100 nightly, Focalin 15 twice daily, lamotrigine 100 twice daily, Ingrezza 60 mg twice daily, Viibryd 20 mg daily for 6 weeks. SE still shuffles at night bc afraid she will fall.  No worse.  No shuffling daytime. On occ taking Focalin 20 mg BID to help mood as primary benefit. Racing thoughts not often but not gone. Still a lot of worry.  Still has irritability. Some dep also but not as bad. Gets up one or two times nightly.   Plan: Increase Viibryd 30-then 40 mg daily for anxiety and depression.  06/08/23 appt noted: Psych meds as above;  Viibryd 40 mg. No SE with it as far as she knows She feels like mood has been more stable.  Still episodes of plunging but less often and briefer.  Still some bipolar moments doing things impulsively that later she regrets.   Anxiety still high but not as bad. Still shuffles at night only bc fear of losing balance but not a problem during the day.   Racing thoughts are better.   Sleep 8-10 and not drowsy.   Still some mouth puckering but better with Ingrezza.  Wonders if it will be covered after [place]  Past Psychiatric Medication Trials: Vraylar 4.5 SE mouth movements reduced to 3 mg 3/20. It was effective at lower doses for depression.  Worse off it.  Vraylar 1.5 mg every third day led to relapse of significant depression. [place] SE at 42 mg .  Cost problems Latuda 80, , olanzapine, Seroquel, risperidone, Abilify, loxapine 25 mg BID (max tolerated) NR, Clozapine 75  Ingrezza 60 BID  helps  lithium 150 tremor   Trileptal 450, Depakote, Equetro 300 hx balance issues, CBZ ER falling,   Lamictal 200 twice daily,  Focalin,  Ritalin,  fluoxetine 60,  sertraline 100, Wellbutrin history of facial tics, paroxetine cognitive side effects, Lexapro 10 worse Fluvoxamine 50  buspirone,   ropinirole, amantadine, Sinemet, Artane, Cogentin,  pramipexole 0.5 mg BID helped tremor but caused anger trazodone hangover, Ambien hangover,    Review  of Systems:  Review of Systems  HENT:  Positive for dental problem and tinnitus.        Chirping cricket sounds in hears since January  2023 drooling  Respiratory:  Negative for cough.   Cardiovascular:  Negative for chest pain.  Gastrointestinal:  Negative for abdominal pain and nausea.       GERD awakening her  Musculoskeletal:  Positive for arthralgias and gait problem.  Neurological:  Positive for dizziness and tremors. Negative for weakness.       Ankle problems and balance problems. No falls lately. Occ stumbles. Tremor is better in hands Mouth movements  Psychiatric/Behavioral:  Positive for dysphoric mood. Negative for agitation, behavioral problems, confusion, decreased concentration, hallucinations, self-injury, sleep disturbance and suicidal ideas. The patient is nervous/anxious. The patient is not hyperactive.   No falls since here. Not currently depressed but unable to remove this from the list.  Medications: I have reviewed the patient's current medications.  Current Outpatient Medications  Medication Sig Dispense Refill   acetaminophen (TYLENOL) 650 MG CR tablet Take 1,300 mg by mouth as needed for pain.     albuterol (VENTOLIN HFA) 108 (90 Base) MCG/ACT inhaler Inhale 2 puffs into the lungs every 4 (four) hours as needed for wheezing or shortness of breath. 18 g 0   atorvastatin (LIPITOR) 20 MG tablet Take 1 tablet (20 mg total) by mouth every evening. 90 tablet 3   dexmethylphenidate (FOCALIN) 10 MG tablet Take 1.5 tablets (15 mg total) by mouth 2 (two) times daily. 90 tablet 0   Ferrous Gluconate (IRON 27 PO) Take by mouth.     ketoconazole (NIZORAL) 2 % cream Apply 1 Application topically daily. 60 g 0   Melatonin 10 MG CAPS Take by mouth at bedtime as needed.     pantoprazole (PROTONIX) 40 MG tablet TAKE (1) TABLET BY MOUTH TWICE DAILY. 180 tablet 1   promethazine-dextromethorphan (PROMETHAZINE-DM) 6.25-15 MG/5ML syrup Take 5 mLs by mouth 4 (four) times daily as  needed. 100 mL 0   cloZAPine (CLOZARIL) 100 MG tablet Take 1 tablet (100 mg total) by mouth at bedtime. 30 tablet 1   lamoTRIgine (LAMICTAL) 100 MG tablet Take 1 tablet (100 mg total) by mouth 2 (two) times daily. 180 tablet 0   valbenazine (INGREZZA) 60 MG capsule Take 1 capsule (60 mg total) by mouth 2 (two) times daily. 60 capsule 1   Vilazodone HCl (VIIBRYD) 40 MG TABS Take 1 tablet (40 mg total) by mouth daily. 90 tablet 0   No current facility-administered medications for this visit.    Medication Side Effects: Other: tremor and weight gain.   Dyskinesia appears better  SE bettter than they were.  Balance problems intermittently  Allergies:  Allergies  Allergen Reactions   Azithromycin Anaphylaxis   Penicillins Anaphylaxis    DID THE REACTION INVOLVE: Swelling of the face/tongue/throat, SOB, or low BP? Yes Sudden or severe rash/hives, skin peeling, or the inside of the mouth or nose? Yes Did it require medical treatment? No When did it last happen?       If all above answers are "NO", may proceed with cephalosporin use.  Patient reacts to Z pack.  HAS Taken amoxicillin fine.   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    Esophageal stricture    Fracture of superior pubic ramus (HCC) 11/28/2018   GERD (gastroesophageal reflux disease)    Headache(784.0)    Hyperlipidemia    Interstitial cystitis    Joint pain    Lactose intolerance    Lung cancer (HCC) 2002   Neuromuscular disorder (HCC)    Obesity    Osteoarthritis    Palpitations    Sleep apnea    Doesn't  use a CPAP   Suicidal ideation 01/20/2020   Swallowing difficulty    Tardive dyskinesia     Family History  Problem Relation Age of Onset   Arthritis Mother    Hearing loss Mother    Hyperlipidemia Mother    Hypertension Mother    Depression Mother    Anxiety disorder Mother    Obesity Mother    Sudden death Mother    Hypertension Father    Diabetes Mellitus II Father    Heart disease Father    Arthritis Father    Cancer Father        Brain   COPD Father    Diabetes Father    Hyperlipidemia Father    Sleep apnea Father    Early death Sister        Aneroxia/Bulimic   Depression Brother    Early death Hydrographic surveyor Accident   Stroke Maternal Grandmother    Hypertension Maternal Grandmother    Arthritis Maternal Grandfather    Heart attack Maternal Grandfather    Hearing loss Maternal Grandfather    Depression Daughter    Drug abuse Daughter    Heart disease Daughter    Hypertension Daughter    Colon cancer Neg Hx    Esophageal cancer Neg Hx    Rectal cancer Neg Hx     Social History   Socioeconomic History   Marital status: Married    Spouse name: Not on file   Number of children: 1   Years of education: 12   Highest education level: Not on file  Occupational History   Occupation: retired  Tobacco Use   Smoking status: Never   Smokeless tobacco: Never  Vaping Use   Vaping status: Never Used  Substance and Sexual Activity   Alcohol use: Yes    Alcohol/week: 1.0 standard drink of alcohol    Types: 1 Glasses of wine per week    Comment: 1 glass wine q few weeks   Drug use: No   Sexual activity: Yes  Other Topics Concern   Not on file  Social History Narrative   Pt lives in [place] with husband [name].  Followed by Dr. Jennelle Human for psychiatry and Rockne Menghini for therapy.   Right handed   Drinks caffeine   One story home   Married lives with husband   retired   International aid/development worker of Corporate investment banker Strain: Low Risk  (06/01/2023)    Overall Financial Resource Strain (CARDIA)    Difficulty of Paying Living Expenses: Not hard at all  Food Insecurity: No Food Insecurity (06/01/2023)   Hunger Vital Sign    Worried About Running Out of Food in the Last Year: Never true    Ran Out of Food in the Last Year: Never true  Transportation Needs: No Transportation Needs (06/01/2023)   PRAPARE -  Administrator, Civil Service (Medical): No    Lack of Transportation (Non-Medical): No  Physical Activity: Inactive (06/01/2023)   Exercise Vital Sign    Days of Exercise per Week: 0 days    Minutes of Exercise per Session: 0 min  Stress: Stress Concern Present (06/01/2023)   [employer] of Occupational Health - Occupational Stress Questionnaire    Feeling of Stress : Very much  Social Connections: Socially Integrated (06/01/2023)   Social Connection and Isolation Panel [NHANES]    Frequency of Communication with Friends and Family: More than three times a week    Frequency of Social Gatherings with Friends and Family: More than three times a week    Attends Religious Services: More than 4 times per year    Active Member of [employer] or Organizations: Yes    Attends Banker Meetings: More than 4 times per year    Marital Status: Married  Catering manager Violence: Not At Risk (06/01/2023)   Humiliation, Afraid, Rape, and Kick questionnaire    Fear of Current or Ex-Partner: No    Emotionally Abused: No    Physically Abused: No    Sexually Abused: No    Past Medical History, Surgical history, Social history, and Family history were reviewed and updated as appropriate.   Please see review of systems for further details on the patient's review from today.   Objective:   Physical Exam:  There were no vitals taken for this visit.  Physical Exam Neurological:     Mental Status: She is alert and oriented to person, place, and time.     Cranial Nerves: No dysarthria.     Gait: Gait normal.     Comments:  Lip licking consistent, no worse Good gait  Psychiatric:        Attention and Perception: Attention and perception normal.        Mood and Affect: Mood is anxious and depressed.        Speech: Speech normal.        Behavior: Behavior is cooperative.        Thought Content: Thought content normal. Thought content is not paranoid or delusional. Thought content does not include homicidal or suicidal ideation. Thought content does not include suicidal plan.        Cognition and Memory: Cognition and memory normal.        Judgment: Judgment normal.     Comments: Insight intact Ongoing residual dep, irritability, anxiety with anxiety worst     Lab Review:     Component Value Date/Time   NA 142 03/22/2023 0916   K 3.9 03/22/2023 0916   CL 103 03/22/2023 0916   CO2 23 03/22/2023 0916   GLUCOSE 93 03/22/2023 0916   GLUCOSE 112 (H) 05/25/2022 1319   BUN 12 03/22/2023 0916   CREATININE 1.04 (H) 03/22/2023 0916   CALCIUM 9.9 03/22/2023 0916   PROT 7.2 03/22/2023 0916   ALBUMIN 4.5 03/22/2023 0916   AST 27 03/22/2023 0916   ALT 14 03/22/2023 0916   ALKPHOS 68 03/22/2023 0916   BILITOT 0.7 03/22/2023 0916   GFRNONAA >60 05/25/2022 1319   GFRAA 96 03/02/2020 1433       Component Value Date/Time   WBC 8.8 03/22/2023 0916   WBC 7.4 01/19/2020 1158   RBC 5.14 03/22/2023 0916   RBC 4.19 01/19/2020 1158   HGB 14.8 03/22/2023 0916   HCT 47.3 (H) 03/22/2023 0916   PLT 225 03/22/2023 0916  MCV 92 03/22/2023 0916   MCH 28.8 03/22/2023 0916   MCH 30.3 01/19/2020 1158   MCHC 31.3 (L) 03/22/2023 0916   MCHC 32.1 01/19/2020 1158   RDW 13.8 03/22/2023 0916   LYMPHSABS 3.4 (H) 03/22/2023 0916   MONOABS 0.7 04/04/2019 1004   EOSABS 0.2 03/22/2023 0916   BASOSABS 0.0 03/22/2023 0916  Vitamin D level 33 on 10K units daily on 12/4/`9 Increased to prescription vitamin d 50K units Monday, Wed, Friday.  Rx sent in.   Lithium Lvl  Date Value Ref Range Status  10/21/2018 0.18 (L) 0.60 - 1.20  mmol/L Final    Comment:    Performed at [facility], [address], [place], [place] [address]     No results found for: "PHENYTOIN", "PHENOBARB", "VALPROATE", "CBMZ"   .res Assessment: Plan:    Bipolar disorder with moderate depression (HCC) - Plan: cloZAPine (CLOZARIL) 100 MG tablet, lamoTRIgine (LAMICTAL) 100 MG tablet, Vilazodone HCl (VIIBRYD) 40 MG TABS  Generalized anxiety disorder - Plan: Vilazodone HCl (VIIBRYD) 40 MG TABS  Attention deficit hyperactivity disorder (ADHD), predominantly inattentive type  Insomnia due to mental condition  Tardive dyskinesia - Plan: valbenazine (INGREZZA) 60 MG capsule  Mild cognitive impairment  Tremor of both hands  Long term current use of clozapine  30 min face to face time with patient was spent on counseling and coordination of care. We discussed multiple dxes and concerns.   Rayhana has chronic rapid cycling bipolar disorder which is chronically unstable and has been difficult to control.  Failed 14 different mood stabilizers.  The rapid cycling is making it difficult to control frequency of depressive episodes and the anxiety as well.  We have typically had to make frequent med changes.   Disc gradual increase bc med sensitivity.  Med sensitivity to SE seems to be the biggest problem preventing better mood control  Disc options for better control of dep, irritability and anxiety.  Incr Viibryd vs clozapine  Ingrezza for TD helped stop tongue chewing but still some mouth movements at 80 mg, which was started 05/23/22.  Shuffling only at night and drooling resolved  Split ingrezza but increase bc TD worse lately to 60 mg BID Consider Austedo.  Disc ECT. Only FDA approved option left. She wants to defer.    Extensive discussion of clozapine dosing recommendations but we will increase more slowly bc she is so med sensitive.Disc risk low WBC, cardiomyopathy, etc, sedation  (Started clozapine on 12/07/21).    Check CBC with diff every 4  weeks.  now For anxiety and irritability, reactivity incr clozapine to 100 mg HS Likely to work better than incr Viibryd but consider latter.  She has a high residual anxiety.  It has been impossible to control all of her symptoms simultaneously without causing side effects. Failed various meds.  Discussed side effects of each medicine. Continue   Focalin IR 15 mg BID DT Cost and off label for depression  Try to spread this out if possible for mood.  She feels this is necessary  .  Can't go higher DT already at/above usual max.   Discussed potential benefits, risks, and side effects of stimulants with patient to include increased heart rate, palpitations, insomnia, increased anxiety, increased irritability, or decreased appetite.  Instructed patient to contact office if experiencing any significant tolerability issues.  Would like to avoid BZ if possible.  Lamotrigine 100 mg twice daily to try to reduce polypharmacy and so improve tolerability of clozapine.  consider reduction if  clozapine helps.  Failed all reasonable alternatives for anxiety.  Option viibryd, Auvelity  Discussed potential metabolic side effects associated with atypical antipsychotics, as well as potential risk for movement side effects. Advised pt to contact office if movement side effects occur.    Checked B12 folate bc memory complaints.  Normal B12 & folate on 05/25/22  Disc SE meds and this is heightened by the complication of necessary polypharmacy.  Supportive  and cognitive behavioral therapy in terms of dealing with husband's addiction and now new dx metastatic prostate CA..  breaking down tasks into manageable parts.  Grief.   Rec exercise as it would help.  Take advantage of family support  Continue Viibryd 40 mg daily for anxiety and depression.  It helped some Take with food.   and usually with dinner.  Requires frequent FU DT chronic instability.  Wants to schedule monthly.  FU 4 weeks.   Meredith Staggers  MD, DFAPA  Please see After Visit Summary for patient specific instructions.    Future Appointments  Date Time Provider Department Center  06/12/2023 10:00 AM Mathis Fare, LCSW CP-CP None  06/13/2023  8:20 AM Anabel Halon, MD R[facility]  06/14/2023 10:15 AM Bella Kennedy, PT AP-REHP None  06/15/2023  1:00 PM Clemons, Mick Sell B THN-CCC None  06/16/2023 10:15 AM Bella Kennedy, PT AP-REHP None  06/26/2023 10:00 AM Mathis Fare, LCSW CP-CP None  07/06/2023  2:30 PM Huey Romans, AUD OPRC-AUD None  07/10/2023 10:00 AM Cottle, Steva Ready., MD CP-CP None  07/11/2023 10:00 AM Mathis Fare, LCSW CP-CP None  07/12/2023  9:45 AM AP-MM 1 AP-MM [facility]  07/24/2023 10:00 AM Mathis Fare, LCSW CP-CP None  08/07/2023 11:00 AM Cottle, Steva Ready., MD CP-CP None  08/08/2023 10:00 AM Mathis Fare, LCSW CP-CP None  08/21/2023  9:00 AM Mathis Fare, LCSW CP-CP None  09/11/2023 10:30 AM Cottle, Steva Ready., MD CP-CP None  09/27/2023  1:00 PM Anabel Halon, MD R[facility]  09/04/2024 10:40 AM RPC-ANNUAL WELLNESS VISIT RPC-RPC RPC    No orders of the defined types were placed in this encounter.      -------------------------------

## 2023-06-08 NOTE — Telephone Encounter (Signed)
Pt reached out to nurse yesterday about her refill. Informed pt this morning her refill was not sent in due to possible changes in dose at her apt this morning with Dr. Jennelle Human.

## 2023-06-12 ENCOUNTER — Ambulatory Visit: Payer: No Typology Code available for payment source | Admitting: Psychiatry

## 2023-06-12 DIAGNOSIS — F3132 Bipolar disorder, current episode depressed, moderate: Secondary | ICD-10-CM | POA: Diagnosis not present

## 2023-06-12 NOTE — Progress Notes (Signed)
[facility] Counselor/Therapist Progress Note  Patient ID: Megan Whitney, MRN: [id number],    Date: 06/12/2023  Time Spent: 55 minutes   Treatment Type: Individual Therapy  Reported Symptoms: anxiety, fear, depression, "some SI 2 days ago but not now", overwhelmed  Mental Status Exam:  Appearance:   Casual and Neat     Behavior:  Appropriate, Sharing, and Motivated  Motor:  Normal  Speech/Language:   Normal Rate  Affect:  Anxious, depressed  Mood:  anxious and depressed  Thought process:  goal directed  Thought content:    Rumination  Sensory/Perceptual disturbances:    WNL  Orientation:  oriented to person, place, time/date, situation, day of week, month of year, year, and stated date of Oct. 7, 2024  Attention:  Fair  Concentration:  Good and Fair  Memory:  WNL  Fund of knowledge:   Good  Insight:    Good  Judgment:   Good  Impulse Control:  Good   Risk Assessment: Danger to Self:  No Self-injurious Behavior: No Danger to Others: No Duty to Warn:no Physical Aggression / Violence:No  Access to Firearms a concern: No  Gang Involvement:No   Subjective: Patient in today reporting anxiety, fear, depression, SI 2 days ago but denies any currently, overwhelmed.  Incident with husband recently when he was taken into hospital after taking more of his meds than prescribed.  Was taken to hospital ER and evaluated, treated, and released to his home.Patient today sharing more about husband's sabotage of medical plan of treatment and her feelings/reactions, as this has been very difficult for her. I need "more strength and the ability to say "no" and talked about this at length. Remaining in good contact with supportive friends and palliative care.Some resentment issues re: husband not following directives of doctor. Less tearfulness per patient today, and still showing more inner strength at times, supported by her friends and her faith.  Denies any SI when I checked in again  with her on this at close of session  Interventions: Cognitive Behavioral Therapy and Ego-Supportive  Long Term Goal: Reduce overall level, frequency, and intensity of the anxiety so that daily functioning is not impaired. Short Term Goal: 1.Increase understanding of the beliefs and messages that produce the worry and anxiety. Strategies: 1.Help client develop reality-based positive cognitive messages/self-talk. 2. Develop a "coping card" or other reminder which coping strategies are recorded for patient's later use.  Diagnosis:   ICD-10-CM   1. Bipolar disorder with moderate depression (HCC)  F31.32      Plan:  Patient in session today and showing good effort following up on her anxiety, depression, some obsessive thoughts, and mixed grief feelings with the terminal illness of her husband.  Mixed feelings as husband continues to not follow medical directives.  Patient has made progress and needs to continue working with her goal-directed behaviors in order to keep moving forward now and into the future.  Reminded and encouraged patient in her practice of more positive and self affirming behaviors as noted in session including: Letting friends be supportive of her as this has proven to be helpful to her over time, remaining focused on making good decisions versus impulsive decisions, believe more in herself and her ability to manage challenging circumstances, refrain from self sabotaging her goals, stay in contact with supportive family and friends, use of journaling between sessions, setting limits with people who are not supportive, refrain from assuming worst-case scenarios, look for the strengths and positives within  herself, spend time with her 2 dogs which is very therapeutic for her, use of positive self talk, and recognize the strengths she shows working with goal-directed behaviors to move in a direction that supports her improved emotional health and overall wellbeing.  Goal review and  progress/challenges noted with patient.  Next appointment within 2 to 3 weeks.  Mathis Fare, LCSW

## 2023-06-13 ENCOUNTER — Ambulatory Visit: Payer: No Typology Code available for payment source | Admitting: Internal Medicine

## 2023-06-14 ENCOUNTER — Encounter (HOSPITAL_COMMUNITY): Payer: No Typology Code available for payment source | Admitting: Physical Therapy

## 2023-06-15 ENCOUNTER — Ambulatory Visit: Payer: Self-pay

## 2023-06-15 NOTE — Patient Outreach (Signed)
  Care Coordination   Initial Visit Note   06/15/2023 Name: Megan Whitney MRN: [id number] DOB: 10-01-1955  Megan Whitney is a 67 y.o. year old female who sees Megan Halon, MD for primary care. I spoke with  Megan Whitney by phone today.  What matters to the patients health and wellness today?  Patient needs assistance with hearing aides.      Goals Addressed   None     SDOH assessments and interventions completed:  No     Care Coordination Interventions:  Yes, provided  Interventions Today    Flowsheet Row Most Recent Value  General Interventions   General Interventions Discussed/Reviewed General Interventions Discussed, General Interventions Reviewed, [employer]  [Pt reports he needs help with hearing aide. Pt request to continue the conversation 10/17 as today is not a good day.]        Follow up plan: Follow up call scheduled for 06/22/23 at 3pm.    Encounter Outcome:  Patient Visit Completed

## 2023-06-15 NOTE — Patient Instructions (Signed)
Visit Information  Thank you for taking time to visit with me today. Please don't hesitate to contact me if I can be of assistance to you.   Following are the goals we discussed today:   Goals Addressed   None     Our next appointment is by telephone on 06/22/23 at 3pm  Please call the care guide team at [phone] if you need to cancel or reschedule your appointment.   If you are experiencing a Mental Health or Behavioral Health Crisis or need someone to talk to, please call 911  Patient verbalizes understanding of instructions and care plan provided today and agrees to view in MyChart. Active MyChart status and patient understanding of how to access instructions and care plan via MyChart confirmed with patient.     Telephone follow up appointment with care management team member scheduled for: 06/22/23 at 3pm  Lysle Morales, BSW Social Worker [phone]

## 2023-06-16 ENCOUNTER — Ambulatory Visit (HOSPITAL_COMMUNITY): Payer: No Typology Code available for payment source | Admitting: Physical Therapy

## 2023-06-16 DIAGNOSIS — M545 Low back pain, unspecified: Secondary | ICD-10-CM | POA: Diagnosis not present

## 2023-06-16 DIAGNOSIS — M6281 Muscle weakness (generalized): Secondary | ICD-10-CM

## 2023-06-16 NOTE — Therapy (Signed)
OUTPATIENT PHYSICAL THERAPY THORACOLUMBAR TREATMENT/progress   Patient Name: Megan Whitney MRN: [id number] DOB:Oct 06, 1955, 67 y.o., female Today's Date: 06/16/2023 Progress Note Reporting Period 05/04/23 to 06/16/23  See note below for Objective Data and Assessment of Progress/Goals.     END OF SESSION:  PT End of Session - 06/16/23 1100     Visit Number 12    Number of Visits 24    Date for PT Re-Evaluation 07/16/23    Authorization Type devoted    Progress Note Due on Visit 10    PT Start Time 1020    PT Stop Time 1100    PT Time Calculation (min) 40 min    Activity Tolerance Patient tolerated treatment well    Behavior During Therapy WFL for tasks assessed/performed            Past Medical History:  Diagnosis Date   ADD (attention deficit disorder)    Allergy    Seasonal   Anemia    History of GI blood loss   Anxiety    Arthritis    Atrophy of vagina 10/07/2020   Bipolar 1 disorder (HCC)    Cancer (HCC)    Colon polyps    Depression    Diabetes mellitus (HCC)    pt denies   Edema, lower extremity    Epistaxis    Around 2011 or 2012, required cauterization.    Esophageal stricture    Fracture of superior pubic ramus (HCC) 11/28/2018   GERD (gastroesophageal reflux disease)    Headache(784.0)    Hyperlipidemia    Interstitial cystitis    Joint pain    Lactose intolerance    Lung cancer (HCC) 2002   Neuromuscular disorder (HCC)    Obesity    Osteoarthritis    Palpitations    Sleep apnea    Doesn't use a CPAP   Suicidal ideation 01/20/2020   Swallowing difficulty    Tardive dyskinesia    Past Surgical History:  Procedure Laterality Date   BALLOON DILATION  05/16/2012   Procedure: BALLOON DILATION;  Surgeon: Louis Meckel, MD;  Location[facility] ENDOSCOPY;  Service: Endoscopy;  Laterality: N/A;   BUNIONECTOMY  2011   COLONOSCOPY     ENTEROSCOPY  05/16/2012   Procedure: ENTEROSCOPY;  Surgeon: Louis Meckel, MD;  Location: [facility] ENDOSCOPY;   Service: Endoscopy;  Laterality: N/A;   JOINT REPLACEMENT     right shoulder durgery 25 yrs ago  1988   TOTAL HIP ARTHROPLASTY Bilateral 2006, 2008   bilateral   TUBAL LIGATION  1990   WEDGE RESECTION  2002   lung cancer   Patient Active Problem List   Diagnosis Date Noted   DDD (degenerative disc disease), lumbar 03/27/2023   Class 1 obesity due to excess calories without serious comorbidity in adult 11/29/2022   Chronic thoracic back pain 09/22/2022   Neuroleptic-induced tardive dyskinesia 07/20/2022   Intertrigo 05/19/2022   Encounter for general adult medical examination with abnormal findings 03/22/2022   Arthritis of knee 08/17/2021   Polyp of colon 02/04/2021   Seasonal allergies 10/07/2020   Osteopenia 10/07/2020   Gait disturbance 10/07/2020   Hyperlipidemia 06/11/2020   Sleep apnea 06/11/2020   Stricture and stenosis of esophagus 05/16/2012   Hiatal hernia 05/16/2012   Dysphagia 05/15/2012   Depression    Bipolar disorder (HCC)    GERD (gastroesophageal reflux disease) 09/14/2010   History of colonic polyps 09/14/2010    PCP: Trena Platt  REFERRING PROVIDER: Anabel Halon, MD  REFERRING DIAG: M51.36 (ICD-10-CM) - DDD (degenerative disc disease), lumbar  Rationale for Evaluation and Treatment: Rehabilitation  THERAPY DIAG:  Lumbar pain  Muscle weakness (generalized)  ONSET DATE: chronic with acute exacerbation last month.    SUBJECTIVE:                                                                                                                                                                                           SUBJECTIVE STATEMENT: PT states that she does not feel therapy is helping, she has not done her exercises as her husband came home from the hospital and she has been dealing with his illness.  After reassessment pt states that she feels encouraged.      Eval:   Pt states that she is aware that she tends to sit and list to the left side  but is unable to correct this.   Pt states that her pain is worse in the afternoon.  By late afternoon she can not be up longer than five minutes.  The pain is achy and is mainly on the right side of her back, the more she does the worse it hurts and it will radiate to the side.    PERTINENT HISTORY:   67 y.o. female with past medical history of bipolar disorder, ADD, anxiety, OSA, GERD, HLD and osteopenia and lumbar pain   PAIN: 06/16/23 now 0/ worst 7-8  Are you having pain? Yes: NPRS scale: 1/10, worst is an 7-8/10; best 0/10 in the mornings.  Pain location: Rt mid and lower back  Pain description: achy Aggravating factors: weight bearing Relieving factors: sitting , heating pad   PRECAUTIONS: None  RED FLAGS: None   WEIGHT BEARING RESTRICTIONS: No  FALLS:  Has patient fallen in last 6 months? No  LIVING ENVIRONMENT: Lives with: lives with their spouse Stairs: No Has following equipment at home: None  OCCUPATION: retired   PLOF: Independent with community mobility without device  PATIENT GOALS: less pain   NEXT MD VISIT: 6 months   OBJECTIVE:    PATIENT SURVEYS:  FOTO 43  SENSATION: WFL  POSTURE: rounded shoulders, decreased lumbar lordosis, increased thoracic kyphosis, and weight shift left   LUMBAR ROM:   AROM eval 06/16/23  Flexion Flexed at 10 degrees Flexed at 5 degrees   Extension Able to go to neutral  5 degrees   Right lateral flexion    Left lateral flexion    Right rotation    Left rotation     (Blank rows = not tested)  *did not test forward flexion due to osteopenia. LOWER EXTREMITY MMT:  MMT Right eval 06/16/23 Left eval 06/16/23  Hip flexion 5/5 5/5 4/5 5/5  Hip extension 4/5 4/5 4/5 4/5  Hip abduction 5/5 5/5 3+/5  3+/5  Hip adduction      Hip internal rotation      Hip external rotation      Knee flexion 4/5 5/5 5/5 5/5  Knee extension 5/5 5/5 5/5 5/5  Ankle dorsiflexion 4/5 5/5 5/5 5/5  Ankle plantarflexion      Ankle  inversion      Ankle eversion       (Blank rows = not tested)  FUNCTIONAL TESTS:  30 seconds chair stand test: 6 in 30 seconds (8 is poor for pt age and sex)  10/11/ 24 ; 11 in 30 seconds ; 11 is below average for age and sex ; 76 is average  2 minute walk test: 394 feet pt leaning to the left while walking  06/16/23 2 minute wt :  452 ft with  slight leaning to the left  Single leg stance on eval: Lt: 12, RT 0 06/01/23: Rt 10.49 sec, 23.36 sec 1011/24 RT: 14; LT 15   TODAY'S TREATMENT:                                                                                                                              DATE: 06/16/23 Reassessment see above: Sit to stand x 10  instruction for proper bed mobility Seated resisted hip abduction  x 10  Prone hip extension x 10  06/07/23 UBE L1, 2 min forward, 2 min backward Thoracic extension into chair x10 Doorway pec stretch x30 sec low, mid, high Deadlift red TB 2x10 Modified stagged stance dead lift 5# handweight on 12" step  Shoulder ext green TB + marching x10 Shoulder ext green TB x10   06/05/23 UBE L1, 2 min forward, 2 min backward Thoracic extension into chair x10 Doorway pec stretch x30 sec low, mid, high Against wall scap squeeze + cervical retraction x10 Against wall shoulder ER 2x10 Against wall "W" 2x10 Against wall glute med iso 2x10 Against wall glute max iso x10 Standing bow and arrow red TB 2x10 Standing shoulder ext red TB + alternating marching 2x10  06/01/23 UBE L1, 2 min forward, 2 min backward Thoracic extension into chair x10 Thoracic ext + rotation to R x10 Doorway pec stretch 2x 30 sec low, mid, high Against wall scap squeeze + cervical retraction x10 Against wall glute med iso x10 Against wall "W" 2x10 Standing reactive iso row red TB 2x10 Standing shoulder ext red TB 2x10 Standing shoulder ext + L hip flexion 2x10 Against wall D2 shoulder flexion red TB 2x10   05/30/23 Standing L stretch x30 sec;  with lateral flexion x30 sec R&L Lateral shift into wall 3x30 sec Prone  "I" with scap squeeze 2x10  "W" 2x10  "T" 2x10 Manual therapy  STM & TPR R thoracolumbar paraspinal Standing  Row red TB 2x10  Shoulder ext red TB 2x10  Against wall scap squeeze + cervical retraction 2x10  PATIENT EDUCATION:  Education details: HEP updates. Self massage using ball to help with muscle spasm Person educated: Patient Education method: Explanation, Verbal cues, and Handouts Education comprehension: verbalized understanding and returned demonstration  HOME EXERCISE PROGRAM: Access Code: [facility] URL: [email or web address] Date: 06/01/2023 Prepared by: Vernon Prey April Kirstie Peri  Exercises - seated posture with towel roll  - 1 x daily - 7 x weekly - 1 sets - 1 reps - Seated Upper Trapezius Stretch  - 2 x daily - 7 x weekly - 1 sets - 5 reps - 20 sec hold - Seated Cervical Rotation AROM  - 2 x daily - 7 x weekly - 1 sets - 10 reps - 5 sec hold - Seated Scapular Retraction  - 2 x daily - 7 x weekly - 1 sets - 10 reps - 5 sec hold - Seated Gentle Upper Trapezius Stretch  - 2 x daily - 7 x weekly - 1 sets - 3 reps - 20" hold - Shoulder extension with resistance - Neutral  - 1 x daily - 7 x weekly - 2 sets - 10 reps - Standing Shoulder Row Reactive Isometric  - 1 x daily - 7 x weekly - 2 sets - 10 reps - Resistance Pulldown with March  - 1 x daily - 7 x weekly - 2 sets - 10 reps - Isometric Gluteus Medius at Wall  - 1 x daily - 7 x weekly - 2 sets - 10 reps - Standing Shoulder Single Arm PNF D2 Flexion with Resistance  - 1 x daily - 7 x weekly - 2 sets - 10 reps 06/16/23 - Supine Single Knee to Chest Stretch  - 1 x daily - 7 x weekly - 1 sets - 3 reps - 30 seconds hold - Supine Double Knee to Chest  - 1 x daily - 7 x weekly - 1 sets - 3 reps - 30 seconds  hold - Seated Hip Abduction with Resistance  - 1 x daily - 7 x weekly - 1 sets - 10 reps - 5 seconds  hold ASSESSMENT:  CLINICAL  IMPRESSION:  PT reassessed today with noted objective improvement but continues to have hip weakness, postural dysfunction and increased pain with prolong standing/walking.  Therapist desires to try a flexion based exercise program to see if we can decrease pt pain to allow her to be more functional. Pt will continue to benefit from skilled PT.  GOALS: Goals reviewed with patient? No  SHORT TERM GOALS: Target date: 05/25/23  Pt to be I in HEP in order to decrease her pain to no greater than a 6/10 Baseline: 06/05/23: 7-8/10 Goal status: on-going  2.  Pt to improve her core strength to be able to stand in an upright position  Baseline:  06/05/23: Able to perform with cueing Goal status: MET  3.  PT to be able to single leg stance for 10 seconds on her RT LE to decrease risk of falling  Baseline:  Goal status: MET (06/01/23)   LONG TERM GOALS: Target date: 06/15/23  Pt to be I in an advanced HEP in order to decrease her pain to no greater than a 3/10 Baseline:  Goal status: on-going  2.  Pt to improve her core strength to be able to stand/sit without listing to the left for 30 minutes. Baseline:  Goal status: on-going  3.  PT to be able to single  leg stance for 20 seconds on her RT LE to decrease risk of falling Baseline:  Goal status: MET (06/01/23)   PLAN:  PT FREQUENCY: 2x/week  PT DURATION: 6 weeks  PLANNED INTERVENTIONS: Therapeutic exercises, Therapeutic activity, Balance training, Gait training, Patient/Family education, Self Care, and Manual therapy.  PLAN FOR NEXT SESSION:   pelvic tilt.  Mat activities in supine with pelvic tilt progress to standing as pt can tolerate, manual for pain as needed.   Virgina Organ, PT CLT [phone]  06/16/2023, 11:03 AM

## 2023-06-21 ENCOUNTER — Ambulatory Visit (HOSPITAL_COMMUNITY): Payer: No Typology Code available for payment source

## 2023-06-21 ENCOUNTER — Encounter (HOSPITAL_COMMUNITY): Payer: Self-pay

## 2023-06-21 DIAGNOSIS — M6281 Muscle weakness (generalized): Secondary | ICD-10-CM

## 2023-06-21 DIAGNOSIS — M545 Low back pain, unspecified: Secondary | ICD-10-CM

## 2023-06-21 NOTE — Therapy (Signed)
OUTPATIENT PHYSICAL THERAPY THORACOLUMBAR TREATMENT/progress   Patient Name: Megan Whitney MRN: [id number] DOB:May 25, 1956, 67 y.o., female Today's Date: 06/21/2023  END OF SESSION:   PT End of Session - 06/21/23 0941     Visit Number 13    Number of Visits 24    Date for PT Re-Evaluation 07/16/23    Authorization Type devoted    PT Start Time 0935    PT Stop Time 1015    PT Time Calculation (min) 40 min    Activity Tolerance Patient tolerated treatment well    Behavior During Therapy [facility] for tasks assessed/performed             Past Medical History:  Diagnosis Date   ADD (attention deficit disorder)    Allergy    Seasonal   Anemia    History of GI blood loss   Anxiety    Arthritis    Atrophy of vagina 10/07/2020   Bipolar 1 disorder (HCC)    Cancer (HCC)    Colon polyps    Depression    Diabetes mellitus (HCC)    pt denies   Edema, lower extremity    Epistaxis    Around 2011 or 2012, required cauterization.    Esophageal stricture    Fracture of superior pubic ramus (HCC) 11/28/2018   GERD (gastroesophageal reflux disease)    Headache(784.0)    Hyperlipidemia    Interstitial cystitis    Joint pain    Lactose intolerance    Lung cancer (HCC) 2002   Neuromuscular disorder (HCC)    Obesity    Osteoarthritis    Palpitations    Sleep apnea    Doesn't use a CPAP   Suicidal ideation 01/20/2020   Swallowing difficulty    Tardive dyskinesia    Past Surgical History:  Procedure Laterality Date   BALLOON DILATION  05/16/2012   Procedure: BALLOON DILATION;  Surgeon: Louis Meckel, MD;  Location: [facility] ENDOSCOPY;  Service: Endoscopy;  Laterality: N/A;   BUNIONECTOMY  2011   COLONOSCOPY     ENTEROSCOPY  05/16/2012   Procedure: ENTEROSCOPY;  Surgeon: Louis Meckel, MD;  Location: [facility] ENDOSCOPY;  Service: Endoscopy;  Laterality: N/A;   JOINT REPLACEMENT     right shoulder durgery 25 yrs ago  1988   TOTAL HIP ARTHROPLASTY Bilateral 2006, 2008   bilateral    TUBAL LIGATION  1990   WEDGE RESECTION  2002   lung cancer   Patient Active Problem List   Diagnosis Date Noted   DDD (degenerative disc disease), lumbar 03/27/2023   Class 1 obesity due to excess calories without serious comorbidity in adult 11/29/2022   Chronic thoracic back pain 09/22/2022   Neuroleptic-induced tardive dyskinesia 07/20/2022   Intertrigo 05/19/2022   Encounter for general adult medical examination with abnormal findings 03/22/2022   Arthritis of knee 08/17/2021   Polyp of colon 02/04/2021   Seasonal allergies 10/07/2020   Osteopenia 10/07/2020   Gait disturbance 10/07/2020   Hyperlipidemia 06/11/2020   Sleep apnea 06/11/2020   Stricture and stenosis of esophagus 05/16/2012   Hiatal hernia 05/16/2012   Dysphagia 05/15/2012   Depression    Bipolar disorder (HCC)    GERD (gastroesophageal reflux disease) 09/14/2010   History of colonic polyps 09/14/2010    PCP: Trena Platt  REFERRING PROVIDER: Anabel Halon, MD  REFERRING DIAG: M51.36 (ICD-10-CM) - DDD (degenerative disc disease), lumbar  Rationale for Evaluation and Treatment: Rehabilitation  THERAPY DIAG:  Lumbar pain  Muscle weakness (generalized)  ONSET DATE: chronic with acute exacerbation last month.    SUBJECTIVE:                                                                                                                                                                                           SUBJECTIVE STATEMENT:  Doing well today and denies pain. However, back will hurt towards the end of the day (7/10) and when she does heavy chores.  Progress note 06/16/23: PT states that she does not feel therapy is helping, she has not done her exercises as her husband came home from the hospital and she has been dealing with his illness.  After reassessment pt states that she feels encouraged.      Eval:   Pt states that she is aware that she tends to sit and list to the left side but is  unable to correct this.   Pt states that her pain is worse in the afternoon.  By late afternoon she can not be up longer than five minutes.  The pain is achy and is mainly on the right side of her back, the more she does the worse it hurts and it will radiate to the side.    PERTINENT HISTORY:   67 y.o. female with past medical history of bipolar disorder, ADD, anxiety, OSA, GERD, HLD and osteopenia and lumbar pain   PAIN: 06/16/23 now 0/ worst 7-8  Are you having pain? Yes: NPRS scale: 1/10, worst is an 7-8/10; best 0/10 in the mornings.  Pain location: Rt mid and lower back  Pain description: achy Aggravating factors: weight bearing Relieving factors: sitting , heating pad   PRECAUTIONS: None  RED FLAGS: None   WEIGHT BEARING RESTRICTIONS: No  FALLS:  Has patient fallen in last 6 months? No  LIVING ENVIRONMENT: Lives with: lives with their spouse Stairs: No Has following equipment at home: None  OCCUPATION: retired   PLOF: Independent with community mobility without device  PATIENT GOALS: less pain   NEXT MD VISIT: 6 months   OBJECTIVE:    PATIENT SURVEYS:  FOTO 43  SENSATION: WFL  POSTURE: rounded shoulders, decreased lumbar lordosis, increased thoracic kyphosis, and weight shift left   LUMBAR ROM:   AROM eval 06/16/23  Flexion Flexed at 10 degrees Flexed at 5 degrees   Extension Able to go to neutral  5 degrees   Right lateral flexion    Left lateral flexion    Right rotation    Left rotation     (Blank rows = not tested)  *did not test forward flexion due to osteopenia. LOWER EXTREMITY MMT:  MMT Right eval 06/16/23 Left eval 06/16/23  Hip flexion 5/5 5/5 4/5 5/5  Hip extension 4/5 4/5 4/5 4/5  Hip abduction 5/5 5/5 3+/5  3+/5  Hip adduction      Hip internal rotation      Hip external rotation      Knee flexion 4/5 5/5 5/5 5/5  Knee extension 5/5 5/5 5/5 5/5  Ankle dorsiflexion 4/5 5/5 5/5 5/5  Ankle plantarflexion      Ankle inversion       Ankle eversion       (Blank rows = not tested)  FUNCTIONAL TESTS:  30 seconds chair stand test: 6 in 30 seconds (8 is poor for pt age and sex)  10/11/ 24 ; 11 in 30 seconds ; 11 is below average for age and sex ; 75 is average  2 minute walk test: 394 feet pt leaning to the left while walking  06/16/23 2 minute wt :  452 ft with  slight leaning to the left  Single leg stance on eval: Lt: 12, RT 0 06/01/23: Rt 10.49 sec, 23.36 sec 1011/24 RT: 14; LT 15   TODAY'S TREATMENT:                                                                                                                              DATE: 06/21/23 Recumbent bike, seat 6, level 1, 5' Seated hamstring stretch x 30" x 3 Bridging with hip abd, RTB x 10 x 2 x 3" LTR x 1' Lunge to stretch hip flexors x 30" x 2 Seated marches, neutral spine x 10 x 2 Seated alternating knee ext, neutral spine x 10 x 2  06/16/23 Reassessment see above: Sit to stand x 10  instruction for proper bed mobility Seated resisted hip abduction  x 10  Prone hip extension x 10  06/07/23 UBE L1, 2 min forward, 2 min backward Thoracic extension into chair x10 Doorway pec stretch x30 sec low, mid, high Deadlift red TB 2x10 Modified stagged stance dead lift 5# handweight on 12" step  Shoulder ext green TB + marching x10 Shoulder ext green TB x10   06/05/23 UBE L1, 2 min forward, 2 min backward Thoracic extension into chair x10 Doorway pec stretch x30 sec low, mid, high Against wall scap squeeze + cervical retraction x10 Against wall shoulder ER 2x10 Against wall "W" 2x10 Against wall glute med iso 2x10 Against wall glute max iso x10 Standing bow and arrow red TB 2x10 Standing shoulder ext red TB + alternating marching 2x10  06/01/23 UBE L1, 2 min forward, 2 min backward Thoracic extension into chair x10 Thoracic ext + rotation to R x10 Doorway pec stretch 2x 30 sec low, mid, high Against wall scap squeeze + cervical retraction  x10 Against wall glute med iso x10 Against wall "W" 2x10 Standing reactive iso row red TB 2x10 Standing shoulder ext red TB 2x10 Standing shoulder ext + L hip flexion 2x10  Against wall D2 shoulder flexion red TB 2x10   05/30/23 Standing L stretch x30 sec; with lateral flexion x30 sec R&L Lateral shift into wall 3x30 sec Prone  "I" with scap squeeze 2x10  "W" 2x10  "T" 2x10 Manual therapy  STM & TPR R thoracolumbar paraspinal Standing  Row red TB 2x10  Shoulder ext red TB 2x10  Against wall scap squeeze + cervical retraction 2x10  PATIENT EDUCATION:  Education details: HEP updates. Self massage using ball to help with muscle spasm Person educated: Patient Education method: Explanation, Verbal cues, and Handouts Education comprehension: verbalized understanding and returned demonstration  HOME EXERCISE PROGRAM: Access Code: [facility] URL: [email or web address] Date: 06/01/2023 Prepared by: Vernon Prey April Kirstie Peri  Exercises - seated posture with towel roll  - 1 x daily - 7 x weekly - 1 sets - 1 reps - Seated Upper Trapezius Stretch  - 2 x daily - 7 x weekly - 1 sets - 5 reps - 20 sec hold - Seated Cervical Rotation AROM  - 2 x daily - 7 x weekly - 1 sets - 10 reps - 5 sec hold - Seated Scapular Retraction  - 2 x daily - 7 x weekly - 1 sets - 10 reps - 5 sec hold - Seated Gentle Upper Trapezius Stretch  - 2 x daily - 7 x weekly - 1 sets - 3 reps - 20" hold - Shoulder extension with resistance - Neutral  - 1 x daily - 7 x weekly - 2 sets - 10 reps - Standing Shoulder Row Reactive Isometric  - 1 x daily - 7 x weekly - 2 sets - 10 reps - Resistance Pulldown with March  - 1 x daily - 7 x weekly - 2 sets - 10 reps - Isometric Gluteus Medius at Wall  - 1 x daily - 7 x weekly - 2 sets - 10 reps - Standing Shoulder Single Arm PNF D2 Flexion with Resistance  - 1 x daily - 7 x weekly - 2 sets - 10 reps 06/16/23 - Supine Single Knee to Chest Stretch  - 1 x daily - 7 x  weekly - 1 sets - 3 reps - 30 seconds hold - Supine Double Knee to Chest  - 1 x daily - 7 x weekly - 1 sets - 3 reps - 30 seconds  hold - Seated Hip Abduction with Resistance  - 1 x daily - 7 x weekly - 1 sets - 10 reps - 5 seconds  hold ASSESSMENT:  CLINICAL IMPRESSION:   Interventions today were geared towards LE strengthening and flexibility as well as core strengthening. . Tolerated all activities without worsening of symptoms. Demonstrated appropriate levels of fatigue. Provided mod amount of multimodal cueing to ensure correct execution of activity with good carry-over. Advised patient not to do the resisted pull down with march as she is having difficulty with it. Patient gave excellent verbal understanding. To date, skilled PT is required to address the impairments and improve function.  PROGRESS NOTE 06/16/23: PT reassessed today with noted objective improvement but continues to have hip weakness, postural dysfunction and increased pain with prolong standing/walking.  Therapist desires to try a flexion based exercise program to see if we can decrease pt pain to allow her to be more functional. Pt will continue to benefit from skilled PT.  GOALS: Goals reviewed with patient? No  SHORT TERM GOALS: Target date: 05/25/23  Pt to be I in HEP in order to decrease her pain to no  greater than a 6/10 Baseline: 06/05/23: 7-8/10 Goal status: on-going  2.  Pt to improve her core strength to be able to stand in an upright position  Baseline:  06/05/23: Able to perform with cueing Goal status: MET  3.  PT to be able to single leg stance for 10 seconds on her RT LE to decrease risk of falling  Baseline:  Goal status: MET (06/01/23)   LONG TERM GOALS: Target date: 06/15/23  Pt to be I in an advanced HEP in order to decrease her pain to no greater than a 3/10 Baseline:  Goal status: on-going  2.  Pt to improve her core strength to be able to stand/sit without listing to the left for 30  minutes. Baseline:  Goal status: on-going  3.  PT to be able to single leg stance for 20 seconds on her RT LE to decrease risk of falling Baseline:  Goal status: MET (06/01/23)   PLAN:  PT FREQUENCY: 2x/week  PT DURATION: 6 weeks  PLANNED INTERVENTIONS: Therapeutic exercises, Therapeutic activity, Balance training, Gait training, Patient/Family education, Self Care, and Manual therapy.  PLAN FOR NEXT SESSION:   pelvic tilt.  Mat activities in supine with pelvic tilt progress to standing as pt can tolerate, manual for pain as needed.    Tish Frederickson. Ruhan Borak, PT, DPT, OCS Board-Certified Clinical Specialist in Orthopedic PT PT Compact Privilege # ([place]): [id number] T  06/21/2023, 9:41 AM

## 2023-06-22 ENCOUNTER — Ambulatory Visit: Payer: Self-pay

## 2023-06-22 NOTE — Patient Outreach (Signed)
  Care Coordination   Initial Visit Note   06/22/2023 Name: Megan Whitney MRN: [id number] DOB: March 01, 1956  Megan Whitney is a 67 y.o. year old female who sees Anabel Halon, MD for primary care. I spoke with  Oliver Hum by phone today.  What matters to the patients health and wellness today?  Patient needs hearing aide assistance.    Goals Addressed             This Visit's Progress    Hearing Assistance       Interventions Today    Flowsheet Row Most Recent Value  General Interventions   General Interventions Discussed/Reviewed General Interventions Discussed, General Interventions Reviewed, Community Resources  [Pt needs hearing assistance. SW provides information and instructions for [employer] program include [facility] and contact information. Pt will contact insurance for coverage.]              SDOH assessments and interventions completed:  Yes  SDOH Interventions Today    Flowsheet Row Most Recent Value  SDOH Interventions   Food Insecurity Interventions Intervention Not Indicated  Housing Interventions Intervention Not Indicated  Transportation Interventions Intervention Not Indicated  Utilities Interventions Intervention Not Indicated        Care Coordination Interventions:  Yes, provided   Follow up plan: Follow up call scheduled for 06/29/23 at 10:30am    Encounter Outcome:  Patient Visit Completed

## 2023-06-22 NOTE — Patient Instructions (Signed)
Visit Information  Thank you for taking time to visit with me today. Please don't hesitate to contact me if I can be of assistance to you.   Following are the goals we discussed today:  Patient is contact insurance and [facility] to schedule an exam.  Patient will contact [employer]HS to request application once exam is completed.   Our next appointment is by telephone on 06/29/23 at 10:30am  Please call the care guide team at [phone] if you need to cancel or reschedule your appointment.   If you are experiencing a Mental Health or Behavioral Health Crisis or need someone to talk to, please call 911  Patient verbalizes understanding of instructions and care plan provided today and agrees to view in MyChart. Active MyChart status and patient understanding of how to access instructions and care plan via MyChart confirmed with patient.     Telephone follow up appointment with care management team member scheduled for: 06/29/23 at 10:30am  Lysle Morales, BSW Social Worker [phone]

## 2023-06-26 ENCOUNTER — Ambulatory Visit: Payer: No Typology Code available for payment source | Admitting: Psychiatry

## 2023-06-26 DIAGNOSIS — F411 Generalized anxiety disorder: Secondary | ICD-10-CM | POA: Diagnosis not present

## 2023-06-26 NOTE — Progress Notes (Signed)
[facility] Counselor/Therapist Progress Note  Patient ID: Megan Whitney, MRN: [id number],    Date: 06/26/2023  Time Spent: 55 minutes   Treatment Type: Individual Therapy  Reported Symptoms: anxiety, "bipolar", depression "better more recently"    Mental Status Exam:  Appearance:   Casual     Behavior:  Appropriate, Sharing, and Motivated  Motor:  Normal  Speech/Language:   Clear and Coherent  Affect:  Depressed and anxious  Mood:  anxious and depressed  Thought process:  normal  Thought content:    Rumination  Sensory/Perceptual disturbances:    WNL  Orientation:  oriented to person, place, time/date, situation, day of week, month of year, year, and stated date of Oct. 21, 2024  Attention:  Fair  Concentration:  Fair  Memory:  WNL  Fund of knowledge:   Good  Insight:    Good and Fair  Judgment:   Good and Fair  Impulse Control:  Good and Fair   Risk Assessment: Danger to Self:  No Self-injurious Behavior: No Danger to Others: No Duty to Warn:no Physical Aggression / Violence:No  Access to Firearms a concern: No  Gang Involvement:No   Subjective: Patient in session today reporting anxiety re: finances, husband's health and his drinking, family relationships.  Denies any SI. 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 More ruminating. And tending to have and ask a lot of "what if's" which are not helpful for patient. Impacted significantly re: family situations and communication. Bothered that husband does not follow medical advice. Good friend support. Worked further today on her anxiety, depression, "inner strength", and ways to nurture her faith in the midst of personal crisis as her faith is very important in her coping.  Interventions: Cognitive Behavioral Therapy and Ego-Supportive  Long Term Goal: Reduce overall level, frequency, and intensity of the anxiety so that daily functioning is not impaired. Short Term  Goal: 1.Increase understanding of the beliefs and messages that produce the worry and anxiety. Strategies: 1.Help client develop reality-based positive cognitive messages/self-talk. 2. Develop a "coping card" or other reminder which coping strategies are recorded for patient's later use.    Diagnosis:   ICD-10-CM   1. Generalized anxiety disorder  F41.1      Plan: Patient in today with continued good efforts in working on her depression, anxiety, grief feelings, and some anger at situations with husband. Was able to talk very openly today about her feelings and also some family relationship issues. Is making progress and needs to continue working with goal directed behaviors to move in a more positive and healthy direction. Reminded and encouraged patient in her practicing of more positive and self affirming behaviors as noted in sessions including: Letting friends be supportive of her as this is proven to be helpful to her over time, remaining focused on making good decisions versus impulsive decisions, believing more in herself and her ability to manage challenging circumstances, refrain from self sabotaging her goals, stay in contact with supportive family and friends, use of journaling between sessions, setting limits with people who are not supportive, refrain from assuming worst-case scenarios, look for the strengths and positives within herself, spend time with her 2 dogs which is very therapeutic for her, positive self talk, and realize the strength she shows working with goal-directed behaviors to move in a direction that supports her improved emotional health and outlook into the future.  Goal review and progress/challenges noted with patient.  Next appointment within  2 to 3 weeks.   Mathis Fare, LCSW

## 2023-06-28 ENCOUNTER — Telehealth: Payer: Self-pay | Admitting: *Deleted

## 2023-06-28 NOTE — Progress Notes (Signed)
  Care Coordination Note  06/28/2023 Name: [name] MRN: [id number] DOB: [birth year]  Megan Whitney is a 67 y.o. year old female who is a primary care patient of Anabel Halon, MD and is actively engaged with the care management team. I reached out to Oliver Hum by phone today to assist with re-scheduling a follow up visit with the BSW  Follow up plan: A successful telephone outreach made. Patient requested a return call in a week.   [facility]  Care Coordination Care Guide  Direct Dial: [phone]

## 2023-06-30 ENCOUNTER — Encounter (HOSPITAL_COMMUNITY): Payer: No Typology Code available for payment source | Admitting: Physical Therapy

## 2023-07-04 DIAGNOSIS — Z79899 Other long term (current) drug therapy: Secondary | ICD-10-CM | POA: Diagnosis not present

## 2023-07-04 DIAGNOSIS — F319 Bipolar disorder, unspecified: Secondary | ICD-10-CM | POA: Diagnosis not present

## 2023-07-05 ENCOUNTER — Encounter: Payer: Self-pay | Admitting: Psychiatry

## 2023-07-06 ENCOUNTER — Ambulatory Visit: Payer: No Typology Code available for payment source | Admitting: Audiology

## 2023-07-07 ENCOUNTER — Ambulatory Visit (HOSPITAL_COMMUNITY): Payer: No Typology Code available for payment source | Attending: Internal Medicine

## 2023-07-07 DIAGNOSIS — M6281 Muscle weakness (generalized): Secondary | ICD-10-CM

## 2023-07-07 DIAGNOSIS — M545 Low back pain, unspecified: Secondary | ICD-10-CM | POA: Diagnosis not present

## 2023-07-07 NOTE — Therapy (Cosign Needed)
OUTPATIENT PHYSICAL THERAPY THORACOLUMBAR TREATMENT/progress   Patient Name: Megan Whitney MRN: [id number] DOB:1956-09-05, 67 y.o., female Today's Date: 07/07/2023  END OF SESSION:   PT End of Session - 07/07/23 1018     Visit Number 14    Number of Visits 24    Date for PT Re-Evaluation 07/16/23    Authorization Type devoted    PT Start Time 1018    PT Stop Time 1100    PT Time Calculation (min) 42 min    Activity Tolerance Patient tolerated treatment well    Behavior During Therapy WFL for tasks assessed/performed             Past Medical History:  Diagnosis Date   ADD (attention deficit disorder)    Allergy    Seasonal   Anemia    History of GI blood loss   Anxiety    Arthritis    Atrophy of vagina 10/07/2020   Bipolar 1 disorder (HCC)    Cancer (HCC)    Colon polyps    Depression    Diabetes mellitus (HCC)    pt denies   Edema, lower extremity    Epistaxis    Around 2011 or 2012, required cauterization.    Esophageal stricture    Fracture of superior pubic ramus (HCC) 11/28/2018   GERD (gastroesophageal reflux disease)    Headache(784.0)    Hyperlipidemia    Interstitial cystitis    Joint pain    Lactose intolerance    Lung cancer (HCC) 2002   Neuromuscular disorder (HCC)    Obesity    Osteoarthritis    Palpitations    Sleep apnea    Doesn't use a CPAP   Suicidal ideation 01/20/2020   Swallowing difficulty    Tardive dyskinesia    Past Surgical History:  Procedure Laterality Date   BALLOON DILATION  05/16/2012   Procedure: BALLOON DILATION;  Surgeon: Louis Meckel, MD;  Location: [facility] ENDOSCOPY;  Service: Endoscopy;  Laterality: N/A;   BUNIONECTOMY  2011   COLONOSCOPY     ENTEROSCOPY  05/16/2012   Procedure: ENTEROSCOPY;  Surgeon: Louis Meckel, MD;  Location: [facility] ENDOSCOPY;  Service: Endoscopy;  Laterality: N/A;   JOINT REPLACEMENT     right shoulder durgery 25 yrs ago  1988   TOTAL HIP ARTHROPLASTY Bilateral 2006, 2008   bilateral    TUBAL LIGATION  1990   WEDGE RESECTION  2002   lung cancer   Patient Active Problem List   Diagnosis Date Noted   DDD (degenerative disc disease), lumbar 03/27/2023   Class 1 obesity due to excess calories without serious comorbidity in adult 11/29/2022   Chronic thoracic back pain 09/22/2022   Neuroleptic-induced tardive dyskinesia 07/20/2022   Intertrigo 05/19/2022   Encounter for general adult medical examination with abnormal findings 03/22/2022   Arthritis of knee 08/17/2021   Polyp of colon 02/04/2021   Seasonal allergies 10/07/2020   Osteopenia 10/07/2020   Gait disturbance 10/07/2020   Hyperlipidemia 06/11/2020   Sleep apnea 06/11/2020   Stricture and stenosis of esophagus 05/16/2012   Hiatal hernia 05/16/2012   Dysphagia 05/15/2012   Depression    Bipolar disorder (HCC)    GERD (gastroesophageal reflux disease) 09/14/2010   History of colonic polyps 09/14/2010    PCP: Trena Platt  REFERRING PROVIDER: Anabel Halon, MD  REFERRING DIAG: M51.36 (ICD-10-CM) - DDD (degenerative disc disease), lumbar  Rationale for Evaluation and Treatment: Rehabilitation  THERAPY DIAG:  Lumbar pain  Muscle weakness (generalized)  ONSET DATE: chronic with acute exacerbation last month.    SUBJECTIVE:                                                                                                                                                                                           SUBJECTIVE STATEMENT:   Lots of pain in back recently. None currently. She feels good during the morning but the pain gets worse throughout the day. At night a heating pad helps relieve her pain. Notices that as she gets tired during the day, she bends forward more. Using a tennis ball against the wall for a muscular release helps for a short bit, but doesn't give long term relief.   Progress note 06/16/23: PT states that she does not feel therapy is helping, she has not done her exercises as her  husband came home from the hospital and she has been dealing with his illness.  After reassessment pt states that she feels encouraged.      Eval:   Pt states that she is aware that she tends to sit and list to the left side but is unable to correct this.   Pt states that her pain is worse in the afternoon.  By late afternoon she can not be up longer than five minutes.  The pain is achy and is mainly on the right side of her back, the more she does the worse it hurts and it will radiate to the side.    PERTINENT HISTORY:   67 y.o. female with past medical history of bipolar disorder, ADD, anxiety, OSA, GERD, HLD and osteopenia and lumbar pain   PAIN: 06/16/23 now 0/ worst 7-8  Are you having pain? Yes: NPRS scale: 1/10, worst is an 7-8/10; best 0/10 in the mornings.  Pain location: Rt mid and lower back  Pain description: achy Aggravating factors: weight bearing Relieving factors: sitting , heating pad   PRECAUTIONS: None  RED FLAGS: None   WEIGHT BEARING RESTRICTIONS: No  FALLS:  Has patient fallen in last 6 months? No  LIVING ENVIRONMENT: Lives with: lives with their spouse Stairs: No Has following equipment at home: None  OCCUPATION: retired   PLOF: Independent with community mobility without device  PATIENT GOALS: less pain   NEXT MD VISIT: 6 months   OBJECTIVE:    PATIENT SURVEYS:  FOTO 92  SENSATION: WFL  POSTURE: rounded shoulders, decreased lumbar lordosis, increased thoracic kyphosis, and weight shift left   LUMBAR ROM:   AROM eval 06/16/23  Flexion Flexed at 10 degrees Flexed at 5 degrees   Extension Able to go to neutral  5 degrees  Right lateral flexion    Left lateral flexion    Right rotation    Left rotation     (Blank rows = not tested)  *did not test forward flexion due to osteopenia. LOWER EXTREMITY MMT:    MMT Right eval 06/16/23 Left eval 06/16/23  Hip flexion 5/5 5/5 4/5 5/5  Hip extension 4/5 4/5 4/5 4/5  Hip abduction 5/5  5/5 3+/5  3+/5  Hip adduction      Hip internal rotation      Hip external rotation      Knee flexion 4/5 5/5 5/5 5/5  Knee extension 5/5 5/5 5/5 5/5  Ankle dorsiflexion 4/5 5/5 5/5 5/5  Ankle plantarflexion      Ankle inversion      Ankle eversion       (Blank rows = not tested)  FUNCTIONAL TESTS:  30 seconds chair stand test: 6 in 30 seconds (8 is poor for pt age and sex)  10/11/ 24 ; 11 in 30 seconds ; 11 is below average for age and sex ; 54 is average  2 minute walk test: 394 feet pt leaning to the left while walking  06/16/23 2 minute wt :  452 ft with  slight leaning to the left  Single leg stance on eval: Lt: 12, RT 0 06/01/23: Rt 10.49 sec, 23.36 sec 1011/24 RT: 14; LT 15   TODAY'S TREATMENT:                                                                                                                              DATE: 07/07/23 Nustep warm up: 5' seat 6 Seated: Thoracic extension over back of chair x10 GTB horizontal ABD pull apart 3x5 GTB rows 3x5 Standing:  Right side bending with left arm OH reach 2x5  Standing extension over parallel bar 2x5  RTB Palfof press 2x5 bilaterally   06/21/23 Recumbent bike, seat 6, level 1, 5' Seated hamstring stretch x 30" x 3 Bridging with hip abd, RTB x 10 x 2 x 3" LTR x 1' Lunge to stretch hip flexors x 30" x 2 Seated marches, neutral spine x 10 x 2 Seated alternating knee ext, neutral spine x 10 x 2  06/16/23 Reassessment see above: Sit to stand x 10  instruction for proper bed mobility Seated resisted hip abduction  x 10  Prone hip extension x 10  06/07/23 UBE L1, 2 min forward, 2 min backward Thoracic extension into chair x10 Doorway pec stretch x30 sec low, mid, high Deadlift red TB 2x10 Modified stagged stance dead lift 5# handweight on 12" step  Shoulder ext green TB + marching x10 Shoulder ext green TB x10   06/05/23 UBE L1, 2 min forward, 2 min backward Thoracic extension into chair x10 Doorway pec  stretch x30 sec low, mid, high Against wall scap squeeze + cervical retraction x10 Against wall shoulder ER 2x10 Against wall "W" 2x10 Against wall glute med iso  2x10 Against wall glute max iso x10 Standing bow and arrow red TB 2x10 Standing shoulder ext red TB + alternating marching 2x10  06/01/23 UBE L1, 2 min forward, 2 min backward Thoracic extension into chair x10 Thoracic ext + rotation to R x10 Doorway pec stretch 2x 30 sec low, mid, high Against wall scap squeeze + cervical retraction x10 Against wall glute med iso x10 Against wall "W" 2x10 Standing reactive iso row red TB 2x10 Standing shoulder ext red TB 2x10 Standing shoulder ext + L hip flexion 2x10 Against wall D2 shoulder flexion red TB 2x10   05/30/23 Standing L stretch x30 sec; with lateral flexion x30 sec R&L Lateral shift into wall 3x30 sec Prone  "I" with scap squeeze 2x10  "W" 2x10  "T" 2x10 Manual therapy  STM & TPR R thoracolumbar paraspinal Standing  Row red TB 2x10  Shoulder ext red TB 2x10  Against wall scap squeeze + cervical retraction 2x10  PATIENT EDUCATION:  Education details: HEP updates. Self massage using ball to help with muscle spasm Person educated: Patient Education method: Explanation, Verbal cues, and Handouts Education comprehension: verbalized understanding and returned demonstration  HOME EXERCISE PROGRAM: Access Code: [facility] URL: [email or web address] Date: 06/01/2023 Prepared by: Vernon Prey April Kirstie Peri  Exercises - seated posture with towel roll  - 1 x daily - 7 x weekly - 1 sets - 1 reps - Seated Upper Trapezius Stretch  - 2 x daily - 7 x weekly - 1 sets - 5 reps - 20 sec hold - Seated Cervical Rotation AROM  - 2 x daily - 7 x weekly - 1 sets - 10 reps - 5 sec hold - Seated Scapular Retraction  - 2 x daily - 7 x weekly - 1 sets - 10 reps - 5 sec hold - Seated Gentle Upper Trapezius Stretch  - 2 x daily - 7 x weekly - 1 sets - 3 reps - 20" hold -  Shoulder extension with resistance - Neutral  - 1 x daily - 7 x weekly - 2 sets - 10 reps - Standing Shoulder Row Reactive Isometric  - 1 x daily - 7 x weekly - 2 sets - 10 reps - Resistance Pulldown with March  - 1 x daily - 7 x weekly - 2 sets - 10 reps - Isometric Gluteus Medius at Wall  - 1 x daily - 7 x weekly - 2 sets - 10 reps - Standing Shoulder Single Arm PNF D2 Flexion with Resistance  - 1 x daily - 7 x weekly - 2 sets - 10 reps 06/16/23 - Supine Single Knee to Chest Stretch  - 1 x daily - 7 x weekly - 1 sets - 3 reps - 30 seconds hold - Supine Double Knee to Chest  - 1 x daily - 7 x weekly - 1 sets - 3 reps - 30 seconds  hold - Seated Hip Abduction with Resistance  - 1 x daily - 7 x weekly - 1 sets - 10 reps - 5 seconds  hold ASSESSMENT:  CLINICAL IMPRESSION:    Interventions today were geared towards thoracic flexibility as well as core strengthening. Tolerated all activities without worsening of symptoms. Provided mod amount of multimodal cueing to ensure correct execution of activity with good carry-over. Patient gave verbal understanding. Noticeable left sided shift in seated and standing. Discussed HEP compliance and split up the program allow her to more shorter sessions since she had only been doing HEP 2x a week due  to the time commitment. Patient felt much better about being able to keep up with this adjustment. To date, skilled PT is required to address mobility, strength, and postural impairments and improve function.  PROGRESS NOTE 06/16/23: PT reassessed today with noted objective improvement but continues to have hip weakness, postural dysfunction and increased pain with prolong standing/walking.  Therapist desires to try a flexion based exercise program to see if we can decrease pt pain to allow her to be more functional. Pt will continue to benefit from skilled PT.  GOALS: Goals reviewed with patient? No  SHORT TERM GOALS: Target date: 05/25/23  Pt to be I in HEP in  order to decrease her pain to no greater than a 6/10 Baseline: 06/05/23: 7-8/10 Goal status: on-going  2.  Pt to improve her core strength to be able to stand in an upright position  Baseline:  06/05/23: Able to perform with cueing Goal status: MET  3.  PT to be able to single leg stance for 10 seconds on her RT LE to decrease risk of falling  Baseline:  Goal status: MET (06/01/23)   LONG TERM GOALS: Target date: 06/15/23  Pt to be I in an advanced HEP in order to decrease her pain to no greater than a 3/10 Baseline:  Goal status: on-going  2.  Pt to improve her core strength to be able to stand/sit without listing to the left for 30 minutes. Baseline:  Goal status: on-going  3.  PT to be able to single leg stance for 20 seconds on her RT LE to decrease risk of falling Baseline:  Goal status: MET (06/01/23)   PLAN:  PT FREQUENCY: 2x/week  PT DURATION: 6 weeks  PLANNED INTERVENTIONS: Therapeutic exercises, Therapeutic activity, Balance training, Gait training, Patient/Family education, Self Care, and Manual therapy.  PLAN FOR NEXT SESSION:   Assess daily compliance to HEP adjustment. Work on LE strength, postural exercise, and thoracic mobility exercises.   11:18 AM, 07/07/23 Karna Christmas, SPT  " I agree with the following treatment note after reviewing documentation. This session was performed under the supervision of a licensed clinician."    9:58 AM, 07/10/23 Amy Small Lynch MPT [facility] [place] [phone]

## 2023-07-08 ENCOUNTER — Encounter: Payer: Self-pay | Admitting: Internal Medicine

## 2023-07-10 ENCOUNTER — Other Ambulatory Visit: Payer: Self-pay | Admitting: Psychiatry

## 2023-07-10 ENCOUNTER — Encounter: Payer: Self-pay | Admitting: Psychiatry

## 2023-07-10 ENCOUNTER — Ambulatory Visit: Payer: No Typology Code available for payment source | Admitting: Psychiatry

## 2023-07-10 DIAGNOSIS — Z79899 Other long term (current) drug therapy: Secondary | ICD-10-CM

## 2023-07-10 DIAGNOSIS — F3132 Bipolar disorder, current episode depressed, moderate: Secondary | ICD-10-CM | POA: Diagnosis not present

## 2023-07-10 DIAGNOSIS — R7989 Other specified abnormal findings of blood chemistry: Secondary | ICD-10-CM

## 2023-07-10 DIAGNOSIS — F411 Generalized anxiety disorder: Secondary | ICD-10-CM

## 2023-07-10 DIAGNOSIS — G3184 Mild cognitive impairment, so stated: Secondary | ICD-10-CM

## 2023-07-10 DIAGNOSIS — G2401 Drug induced subacute dyskinesia: Secondary | ICD-10-CM | POA: Diagnosis not present

## 2023-07-10 DIAGNOSIS — F900 Attention-deficit hyperactivity disorder, predominantly inattentive type: Secondary | ICD-10-CM

## 2023-07-10 DIAGNOSIS — F5105 Insomnia due to other mental disorder: Secondary | ICD-10-CM | POA: Diagnosis not present

## 2023-07-10 MED ORDER — CLOZAPINE 100 MG PO TABS: 100.0000 mg | ORAL_TABLET | Freq: Every day | ORAL | 1 refills | Status: DC

## 2023-07-10 MED ORDER — CLOZAPINE 25 MG PO TABS
25.0000 mg | ORAL_TABLET | Freq: Every morning | ORAL | 1 refills | Status: DC
Start: 2023-07-10 — End: 2023-08-07

## 2023-07-10 NOTE — Telephone Encounter (Signed)
LF 10/29

## 2023-07-10 NOTE — Patient Instructions (Addendum)
Look at Coverage app  Add clozapine 25 mg in AM or noon

## 2023-07-10 NOTE — Progress Notes (Signed)
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  Sometimes hard to talk.  Drooling.  When tries to increase clozapine slurred speech and severe dizziness.   Mood is a little better.   Sleeping 8-9 hours.  So much better sleep with clozapine.   Still has anxiety, generalized. Plan: To minimize polypharmacy and improve tolerabilty:  Reduce Equetro to 1 of the 100 mg capsule at night for 1 week, then stop it. Wait 1 week then stop the carbamazepine chewable. Wait 1 more week then reduce clonidine to 1/2 at night for 1 week then stop it. Plan: Started clozapine  and continue 25 mg for now bc hasn't tolerated more so far.  02/08/22 appt noted: Mouth movements and biting tongue.  Sores on cheek with constant chewing movements. Sometimes hard to talk. Hypersalivation gets worse as day progresses. Sometimes balance problems.  Constipation managed.   Sleep very well.  8-9 hours. Off Equetro and on clozapine. Still has depression and anxiety without much change Plan: Started clozapine  and continue 25 mg for now bc hasn't tolerated more and need to start Ingrezza 40 mg for TD.  03/23/2022 appointment with the  following noted: Several phone calls since here.  Has gotten up to clozapine 37.5 mg nightly. Continues Focalin 15 mg twice daily, lamotrigine 200 mg twice daily, lithium 150 nightly. Started Ingrezza 40 mg daily. Ingrezza amazing difference but even with grant of $10000 can't afford it.  Not biting mouth and mouth less sore.  Less mouth movements but not gone Emotionally not real well with anxiety and depression and crying spells.  Also some irritability and anger.  Easily triggered anger. Sleep more broken with Ingrezza HS but 8-9 hours. Can be sedated if gets up early with slurred speech but not if full night sleep. Balance better off Equetro. Plan: Started clozapine but needs to increase bc minimal effect and better tolerance, so increase to 50 mg HS  04/05/22 appt noted: Increased clozapine to 50 mg HS.  Some groggy in the AM.  One day was dizzy.  Otherwise on occasion.  Takes it right before bed.   Still depression, hopeless, irritability and anger.  Some periods of racing thoughts.  Sometimes recognizes hypomanic episodes and somethimes doesn't recognize. Dog is very sick and H with  bone CA.  Not crying on clozapine as much. GERD and needs surgery for hiatal hernia. Signed up for water aerobics. Plan: Started clozapine but needs to increase bc minimal effect and better tolerance, so increase to 75 mg HS (Started clozapine on 12/07/21) Reduce librium 5 mg HS and plan to stop  05/10/22 appt med: TD partially better with Ingrezza 40.  Has  a grant.   Increased clozapine 75 mg HS, reduced Librium to 5 mg HS. Tolerated OK. Depression a little better.  Irritability still high.  Poor memory and easily confused. Sleep is pretty good and is better and needs to sleep longer.   Plan: DC librium Worsening TD partial response on 40 mg Ingrezza, increase to 60 mg daily   Continue clozapine 100 mg HS  05/17/2022 phone call complaining of sleeping more and feeling sleepy and foggy thinking also  dropping some things and drooling.  She was instructed to reduce the clozapine to 75 mg nightly to see if that was the problem. 05/23/2022 phone call asking to increase Ingrezza from 60 to 80 mg daily.  It was agreed. 06/16/2022 phone call stating she had a bad manic episode the week prior and is still feeling excessively sedated.  Also having hand tremors. Instructed to stop lithium and continue clozapine 75 mg nightly.  She is very med sensitive but we have few options left to treat her unstable bipolar disorder. 06/21/2022 phone call: After complaining of excessive sleepiness and excessive sleeping she is now complaining of insomnia.  07/07/22 appt noted: Current psychiatric medications include clozapine 75 mg nightly, Focalin 15 mg twice daily, lamotrigine 100 mg twice daily Ingrezza 80 mg daily.  She stopped lithium Has a list of concerns: SE drooling bad.   Still have mouth movements with the increase Ingrezza 80 mg daily but has stopped the tongue chewing. Goes to bed 9 PM and to sleep in 30 mins and awaken in the AM about 830 and hard to wake up.  Not napping in the day.  Getting enough sleep.   Notices Focalin kicking in when takes it. Mood depressed but not as bad.  Still some irritability, anger.  3 week ago bad manic anger lasting 3-4 days.  Not sure how her sleep was at the time. Some crying and poor impulse control.   PCP wanted 2nd opinion from neuro on ? PD, Dr. Arbutus Leas Nov 15. Plan: No med changes pending neurologic appointment  07/20/2022 neurology appointment Dr. Lurena Joiner Tat.  Diagnosed TD.  Assessment as follows: 1.  Tardive dyskinesia -The patients symptoms are most consistent with tardive dyskinesia.  She has had exposure to typical and atypical antipsychotic medication.  TD is a heterogeneous syndrome depending on a subtle balance between several neurotransmitters in the brain, including DA receptor blockade and hypersensitivity of DA and GABA receptors. -pt on ingrezza since  02/2022 and both she and notes from Dr. Jennelle Human indicate great benefit.  2.  Tremor             -Largely resolved off of lithium and vraylar (vraylar d/c in 12/2021)             -She has very minor left hand tremor.  Did tell her that Ingrezza can occasionally cause parkinsonism, but I did not see a significant degree of that today.             -I did reassure her today that I saw no evidence of idiopathic Parkinson's disease.  She was reassured.  3.  Bipolar d/o             -difficult to control per records             -sees Dr. Jennelle Human frequently  4.  Discussed with patient that she really does not need neurologic follow-up at this point in time.  She was happy to hear this.  08/08/2022 appointment noted: No med changes. Still having a lot of anxiety and worries too much. Worse than depression.  No mania since here.  No sig avoidance.   Hard time concentration. Still having irritability and anger. SE drowsy with clozapine in AM and hard to function until about 10 AM.  Takes it about 8 PM and then goes to bed.   No falling but is shuffling more since here.   Sleep 10 hours.    09/07/22 appt noted:   Consistently on clozapine 75 mg HS.  Too drowsy if takes 25 mg in AM. Doing so so .  A lot of anxiety, anger, irritation.   Avg 8-9 hours of sleep and pushes herself to get up .  Hard to function in am until 11 or 12 noon. Ingreza 40 mg BID still some mouth movements and biting tongue.  Is better with Ingrezza but not gone.   SE consitpation and drowsy.   She is aware of the difficulty finding balance between aenough med to manage her sx and not so much to cause intolerable SE.  Disc dosing of her meds. Plan: Augment clozapine with fluvoxamine 25 mg HS  10/11/22 appt noted: : Current psych meds: Clozapine 75 mg nightly, Focalin 15 mg twice daily, fluvoxamine 25 mg nightly, lamotrigine 100 mg twice daily, Ingrezza 40 mg twice daily No noticeable change with fluvoxamine. Drooling worse in am and  stupor until about 11 AM.   Mood still not good , angry and irritable a lot.  H acuses he rof going off her meds.  Less mouth movements and biting tonue. Sleep 9-10 hours. Anxiety still through the roof. Tremor better right now unless weak.   H prostate CA with bone mets and on pain meds. Plan: Augment clozapine with fluvoxamine but increase 50mg  HS also to help anxiety, irritability  11/09/2022 appointment noted: Added fluvoxamine 50 mg HS.  And is less anxious and more grounded.  Half as irritable as before fluvoxamine.   Down side is shuffling steps seem worse.  Not dizzy usually but balance isn't good.  Gets better after the day progresses.    Overall does feel improved.   Trembling better and mouth movements much better but drooling is bad nothing seems to help that. Drools in public is embarrassing. Tired a lot better as day progresses.   Plan: Augment clozapine with fluvoxamine but REDUCE TO 37.5 mg mg HS bc more shuffling of feet .  And reduced clozapine to 50 mg daily.  But did help anxiety, irritability Ingrezza for TD helped stop tongue chewing but still some mouth movements at 80 mg, which was started 05/23/22.  Shuffling a little more and can't reduce Ingrezza.  Split ingrezza 40 BID  12/12/22 appt noted: Made changes as above.  Asks about increasing Focalin 20 BID bc lack of energy and motivation and productivity.  No SE.   No jittery,  HA. Usally sleep Is good but not always. Still shuffling if gets up at night or before morning Focalin.  Then it clears up.  No change in shuffling since here last visit.  Other day used H's walker.  Like losing balance.   Mouth movements still there but not nearly as bad.  Worse if forgets a dose of Ingrezza.   Mood depressed and more anxious than last visit.  Constantly obsessive thinking about things that could go wrong.  More short tempered.  Impaitent at home only. H got bad report from onc that current chemo not workingand it is changed with  limited success rate.  This affects her mood too.   No change in sleep. Plan: Plan: First 2 weeks reduce Ingrezza to 60 mg at night to see if shuffling is better with samples. If shuffling is better call office for change in RX If so then increase clozapine back to 3 of the 25 mg capsules and fluvoxamine back to 50 mg .  12/19/22 TC with nurse: Lambert Keto, LPN   TS   09/23/12 11:16 AM Note Pt was in to see her therapist, Rockne Menghini today and asked to speak with nurse. She reports having apt with Dr. Jennelle Human last week on April 8th, and he reduced her Ingrezza to 60 mg, she feels that the issue isn't coming from that but the Fluvoxamine that she was put on a few months back. She reports she may not have explained herself well enough at the apt. She reports when she wakes up during the night she has to shuffle and hold on to the wall to keep from falling. She reports her husband has cancer and she needs to be more alert and able to function in case of emergency with him.  She reports nothing changed with the decrease in that medication.    Informed her I would discuss with Dr. Jennelle Human and get back with her.     12/19/22 MD resp:  Rip Harbour.  Reduce fluvoxamine from 1 and 1/2 tablets at night to 1/2 tablet twice daily.  Split dose to reduce SE      01/09/23 appt noted: Meds: clozapine 50 mg HS, reduced fluvoxamine 12.5 mg Am, forcalin 15 BID, Ingrezza 40 BID, lamotrigine 100 BID,  Not doing well.  Dep and high anxiety.  Some irritability.  Mood swings not dramatic.  No SI. Balance issues when up in middle of night.  Does better once takes morning meds with focalin.   Sometimes forgets afternoon Focalin. Mouth movements still there but better.  Drooling stopped. A lot of things seem like too much working.  Still some wiggling toes and may chew side of mouth , not severe. Plan: Plan: DC fluvoxamine Trial Viibryd 5 mg daily for 1 week then 10 mg daily.  02/06/23 appt noted: Meds:  viibryd 10 HS,  clozapine 50 mg HS, off  fluvoxamine,  focalin 15 BID, Ingrezza 40 BID, lamotrigine 100 BID,  Walking better at night wihout fluvoxamine and less dep but mood swings and anxiety through the roof.   Seems higher than last time.  Worry about everything.   Reduced appetite.  Some am nausea.   Upset with H's drinking.  He has terminal CA, prostate stage 4 mets to bones.  Hared living with someone terminal and also alcoholic.  Living with a lot of stress.   Less falling.   Mouth movements seem worse.  No drooling. Plan: increase Clozapine 62.5 mg HS for a week for mood swings and anxity and if fails but no SE then increase to 75 mg HS. Split ingrezza but increase bc TD worse lately to 60 mg BID  03/14/23 appt Noted: Increased clozapine to 75 mg HS. No  SE except sedating at night.   Anxiety is still high but dep seems better.  Worry over H's CA and hosp last weekend.  Lots of medical bills.  General worry mostly about H's CA and alcoholism. Couple mood swings and racing obsessive thinking briefly. Sleeping well.  No SI Loss of appetite.   Plan: Increase to  Viibryd 15 mg daily for anxiety.   04/10/23 appt noted: Psych meds:  viibryd incr to 20 HS 2 weeks, clozapine 75 mg HS,  focalin usually 20 AM, Ingrezza 60 BID, lamotrigine 100 BID,  Noticed improvement in dep with incr Viibryd 20 mg without much change in anxiety.  Stress level is still high also.  H health getting worse and still drinking and fears having to call EMT bc of this. Taking Viibryd with food. Still some dizziness but no falls. Sleep is good.  No major mood swings. Still in counseling  and working on anxiety and low confidence. Worries over having to take care of finances.   Some racing thoughts in brief spells. Wants better cotrol of anxiety with constant worry and still irritable. Plan: For anxiety and irritability, reactivity incr clozapine to 100 mg HS  05/09/23 appt noted: Psych med: Clozapine 100 nightly, Focalin 15 twice daily,  lamotrigine 100 twice daily, Ingrezza 60 mg twice daily, Viibryd 20 mg daily for 6 weeks. SE still shuffles at night bc afraid she will fall.  No worse.  No shuffling daytime. On occ taking Focalin 20 mg BID to help mood as primary benefit. Racing thoughts not often but not gone. Still a lot of worry.  Still has irritability. Some dep also but not as bad. Gets up one or two times nightly.   Plan: Increase Viibryd 30-then 40 mg daily for anxiety and depression.  06/08/23 appt noted: Psych meds as above;  Viibryd 40 mg. No SE with it as far as she knows She feels like mood has been more stable.  Still episodes of plunging but less often and briefer.  Still some bipolar moments doing things impulsively that later she regrets.   Anxiety still high but not as bad. Still shuffles at night only bc fear of losing balance but not a problem during the day.   Racing thoughts are better.   Sleep 8-10 and not drowsy.   Still some mouth puckering but better with Ingrezza.  Wonders if it will be covered after [facility]  07/10/23 apppt noted. Psych meds: Clozapine 100 @ 8 pm, Focalin 15 twice daily forgets 2nd dose, lamotrigine 100 twice daily, Ingrezza 60 twice daily, Viibryd 40 Not well.  10 day ago manic rage, yelling , cursing , slamming doors.  Lasted about an hour.  Totally out of control.  Last night H said something triggering anger and she was aggressive in speech.  Still seem to be angry and irritable.  Also racing thoughts random but chronic anxiety over finances.   With new [employer] advantage plan.  Looking into coverage for Ingrezza.  Concerned she might not be able to afford it.   Shuffles feet and balance problems when gets up and down.  Gets better when takes focalin I the morning.   Also having some depression    To bed 8 pm.     Past Psychiatric Medication Trials: Vraylar 4.5 SE mouth movements reduced to 3 mg 3/20. It was effective at lower doses for depression.  Worse off it.  Vraylar 1.5 mg  every third day led to relapse of significant depression.  Caplyta SE at 42 mg .  Cost problems Latuda 80, , olanzapine, Seroquel, risperidone, Abilify, loxapine 25 mg BID (max tolerated) NR, Clozapine 100  Ingrezza 60 BID  helps No [place]  lithium 150 tremor   Trileptal 450, Depakote, Equetro 300 hx balance issues, CBZ ER falling,   Lamictal 200 twice daily,  Focalin,  Ritalin,   fluoxetine 60,  sertraline 100, Wellbutrin history of facial tics, paroxetine cognitive side effects, Lexapro 10 worse Fluvoxamine 50  buspirone,   ropinirole, amantadine, Sinemet, Artane, Cogentin,  pramipexole 0.5 mg BID helped tremor but caused anger trazodone hangover, Ambien hangover,    Review of Systems:  Review of Systems  HENT:  Positive for dental problem and tinnitus.        Chirping cricket sounds in hears since January 2023 drooling  Respiratory:  Negative for cough.   Cardiovascular:  Negative for chest pain.  Gastrointestinal:  Negative for abdominal pain and nausea.       GERD awakening her  Musculoskeletal:  Positive for arthralgias and gait problem.  Neurological:  Positive for dizziness and tremors. Negative for weakness.       Ankle problems and balance problems. No falls lately. Occ stumbles. Tremor is better in hands Mouth movements  Psychiatric/Behavioral:  Positive for dysphoric mood. Negative for agitation, behavioral problems, confusion, decreased concentration, hallucinations, self-injury, sleep disturbance and suicidal ideas. The patient is nervous/anxious. The patient is not hyperactive.   No falls since here. Not currently depressed but unable to remove this from the list.  Medications: I have reviewed the patient's current medications.  Current Outpatient Medications  Medication Sig Dispense Refill   acetaminophen (TYLENOL) 650 MG CR tablet Take 1,300 mg by mouth as needed for pain.     albuterol (VENTOLIN HFA) 108 (90 Base) MCG/ACT inhaler Inhale 2 puffs into  the lungs every 4 (four) hours as needed for wheezing or shortness of breath. 18 g 0   atorvastatin (LIPITOR) 20 MG tablet Take 1 tablet (20 mg total) by mouth every evening. 90 tablet 3   cloZAPine (CLOZARIL) 100 MG tablet Take 1 tablet (100 mg total) by mouth at bedtime. 30 tablet 1   dexmethylphenidate (FOCALIN) 10 MG tablet Take 1.5 tablets (15 mg total) by mouth 2 (two) times daily. 90 tablet 0   Ferrous Gluconate (IRON 27 PO) Take by mouth.     ketoconazole (NIZORAL) 2 % cream Apply 1 Application topically daily. 60 g 0   lamoTRIgine (LAMICTAL) 100 MG tablet Take 1 tablet (100 mg total) by mouth 2 (two) times daily. 180 tablet 0   Melatonin 10 MG CAPS Take by mouth at bedtime as needed.     pantoprazole (PROTONIX) 40 MG tablet TAKE (1) TABLET BY MOUTH TWICE DAILY. 180 tablet 1   promethazine-dextromethorphan (PROMETHAZINE-DM) 6.25-15 MG/5ML syrup Take 5 mLs by mouth 4 (four) times daily as needed. 100 mL 0   valbenazine (INGREZZA) 60 MG capsule Take 1 capsule (60 mg total) by mouth 2 (two) times daily. 60 capsule 1   Vilazodone HCl (VIIBRYD) 40 MG TABS Take 1 tablet (40 mg total) by mouth daily. 90 tablet 0   No current facility-administered medications for this visit.    Medication Side Effects: Other: tremor and weight gain.   Dyskinesia appears better  SE bettter than they were.  Balance problems intermittently  Allergies:  Allergies  Allergen Reactions   Azithromycin Anaphylaxis   Penicillins Anaphylaxis    DID THE REACTION INVOLVE: Swelling of the face/tongue/throat, SOB, or  low BP? Yes Sudden or severe rash/hives, skin peeling, or the inside of the mouth or nose? Yes Did it require medical treatment? No When did it last happen?       If all above answers are "NO", may proceed with cephalosporin use.  Patient reacts to Z pack.  HAS Taken amoxicillin fine.   Adhesive [Tape] Other (See Comments)    On bandaids    Past Medical History:  Diagnosis Date   ADD (attention  deficit disorder)    Allergy    Seasonal   Anemia    History of GI blood loss   Anxiety    Arthritis    Atrophy of vagina 10/07/2020   Bipolar 1 disorder (HCC)    Cancer (HCC)    Colon polyps    Depression    Diabetes mellitus (HCC)    pt denies   Edema, lower extremity    Epistaxis    Around 2011 or 2012, required cauterization.    Esophageal stricture    Fracture of superior pubic ramus (HCC) 11/28/2018   GERD (gastroesophageal reflux disease)    Headache(784.0)    Hyperlipidemia    Interstitial cystitis    Joint pain    Lactose intolerance    Lung cancer (HCC) 2002   Neuromuscular disorder (HCC)    Obesity    Osteoarthritis    Palpitations    Sleep apnea    Doesn't use a CPAP   Suicidal ideation 01/20/2020   Swallowing difficulty    Tardive dyskinesia     Family History  Problem Relation Age of Onset   Arthritis Mother    Hearing loss Mother    Hyperlipidemia Mother    Hypertension Mother    Depression Mother    Anxiety disorder Mother    Obesity Mother    Sudden death Mother    Hypertension Father    Diabetes Mellitus II Father    Heart disease Father    Arthritis Father    Cancer Father        Brain   COPD Father    Diabetes Father    Hyperlipidemia Father    Sleep apnea Father    Early death Sister        Aneroxia/Bulimic   Depression Brother    Early death Hydrographic surveyor Accident   Stroke Maternal Grandmother    Hypertension Maternal Grandmother    Arthritis Maternal Grandfather    Heart attack Maternal Grandfather    Hearing loss Maternal Grandfather    Depression Daughter    Drug abuse Daughter    Heart disease Daughter    Hypertension Daughter    Colon cancer Neg Hx    Esophageal cancer Neg Hx    Rectal cancer Neg Hx     Social History   Socioeconomic History   Marital status: Married    Spouse name: Not on file   Number of children: 1   Years of education: 12   Highest education level: Not on file  Occupational History    Occupation: retired  Tobacco Use   Smoking status: Never   Smokeless tobacco: Never  Vaping Use   Vaping status: Never Used  Substance and Sexual Activity   Alcohol use: Yes    Alcohol/week: 1.0 standard drink of alcohol    Types: 1 Glasses of wine per week    Comment: 1 glass wine q few weeks   Drug use: No   Sexual activity: Yes  Other Topics Concern   Not on file  Social History Narrative   Pt lives in [place] with husband [name].  Followed by Dr. Jennelle Human for psychiatry and Rockne Menghini for therapy.   Right handed   Drinks caffeine   One story home   Married lives with husband   retired   International aid/development worker of Corporate investment banker Strain: Low Risk  (06/01/2023)   Overall Financial Resource Strain (CARDIA)    Difficulty of Paying Living Expenses: Not hard at all  Food Insecurity: No Food Insecurity (06/22/2023)   Hunger Vital Sign    Worried About Running Out of Food in the Last Year: Never true    Ran Out of Food in the Last Year: Never true  Transportation Needs: No Transportation Needs (06/22/2023)   PRAPARE - Administrator, Civil Service (Medical): No    Lack of Transportation (Non-Medical): No  Physical Activity: Inactive (06/01/2023)   Exercise Vital Sign    Days of Exercise per Week: 0 days    Minutes of Exercise per Session: 0 min  Stress: Stress Concern Present (06/01/2023)   [employer] of Occupational Health - Occupational Stress Questionnaire    Feeling of Stress : Very much  Social Connections: Socially Integrated (06/01/2023)   Social Connection and Isolation Panel [NHANES]    Frequency of Communication with Friends and Family: More than three times a week    Frequency of Social Gatherings with Friends and Family: More than three times a week    Attends Religious Services: More than 4 times per year    Active Member of [employer] or Organizations: Yes    Attends Banker Meetings: More than 4 times per year    Marital  Status: Married  Catering manager Violence: Not At Risk (06/01/2023)   Humiliation, Afraid, Rape, and Kick questionnaire    Fear of Current or Ex-Partner: No    Emotionally Abused: No    Physically Abused: No    Sexually Abused: No    Past Medical History, Surgical history, Social history, and Family history were reviewed and updated as appropriate.   Please see review of systems for further details on the patient's review from today.   Objective:   Physical Exam:  There were no vitals taken for this visit.  Physical Exam Neurological:     Mental Status: She is alert and oriented to person, place, and time.     Cranial Nerves: No dysarthria.     Gait: Gait normal.     Comments: Lip licking consistent, no worse Good gait  Psychiatric:        Attention and Perception: Attention and perception normal.        Mood and Affect: Mood is anxious and depressed.        Speech: Speech normal.        Behavior: Behavior is cooperative.        Thought Content: Thought content normal. Thought content is not paranoid or delusional. Thought content does not include homicidal or suicidal ideation. Thought content does not include suicidal plan.        Cognition and Memory: Cognition and memory normal.        Judgment: Judgment normal.     Comments: Insight intact Ongoing residual dep, irritability, anxiety with anxiety worst     Lab Review:     Component Value Date/Time   NA 142 03/22/2023 0916   K 3.9 03/22/2023 0916   CL 103 03/22/2023  0916   CO2 23 03/22/2023 0916   GLUCOSE 93 03/22/2023 0916   GLUCOSE 112 (H) 05/25/2022 1319   BUN 12 03/22/2023 0916   CREATININE 1.04 (H) 03/22/2023 0916   CALCIUM 9.9 03/22/2023 0916   PROT 7.2 03/22/2023 0916   ALBUMIN 4.5 03/22/2023 0916   AST 27 03/22/2023 0916   ALT 14 03/22/2023 0916   ALKPHOS 68 03/22/2023 0916   BILITOT 0.7 03/22/2023 0916   GFRNONAA >60 05/25/2022 1319   GFRAA 96 03/02/2020 1433       Component Value Date/Time    WBC 8.8 03/22/2023 0916   WBC 7.4 01/19/2020 1158   RBC 5.14 03/22/2023 0916   RBC 4.19 01/19/2020 1158   HGB 14.8 03/22/2023 0916   HCT 47.3 (H) 03/22/2023 0916   PLT 225 03/22/2023 0916   MCV 92 03/22/2023 0916   MCH 28.8 03/22/2023 0916   MCH 30.3 01/19/2020 1158   MCHC 31.3 (L) 03/22/2023 0916   MCHC 32.1 01/19/2020 1158   RDW 13.8 03/22/2023 0916   LYMPHSABS 3.4 (H) 03/22/2023 0916   MONOABS 0.7 04/04/2019 1004   EOSABS 0.2 03/22/2023 0916   BASOSABS 0.0 03/22/2023 0916  Vitamin D level 33 on 10K units daily on 12/4/`9 Increased to prescription vitamin d 50K units Monday, Wed, Friday.  Rx sent in.   Lithium Lvl  Date Value Ref Range Status  10/21/2018 0.18 (L) 0.60 - 1.20 mmol/L Final    Comment:    Performed at [facility], [address], [place], [place] [address]     No results found for: "PHENYTOIN", "PHENOBARB", "VALPROATE", "CBMZ"   .res Assessment: Plan:    Bipolar disorder with moderate depression (HCC)  Generalized anxiety disorder  Attention deficit hyperactivity disorder (ADHD), predominantly inattentive type  Insomnia due to mental condition  Tardive dyskinesia  Mild cognitive impairment  Long term current use of clozapine  Low vitamin D level  30 min face to face time with patient was spent on counseling and coordination of care. We discussed multiple dxes and concerns.   Anayla has chronic rapid cycling bipolar disorder which is chronically unstable and has been difficult to control.  Failed 14 different mood stabilizers.  The rapid cycling is making it difficult to control frequency of depressive episodes and the anxiety as well.  We have typically had to make frequent med changes.   Disc gradual increase bc med sensitivity.  Med sensitivity to SE seems to be the biggest problem preventing better mood control  Disc options for better control of dep, irritability and anxiety.  Incr Viibryd vs clozapine  Ingrezza for TD helped stop tongue chewing  but still some mouth movements at 80 mg, which was started 05/23/22.  Shuffling only at night now.  Drooling resolved  Split ingrezza but increase bc TD worse lately to 60 mg BID Consider Austedo.  Disc cost concerns  .  Getting grant but that might end.  Need to pick insurance that will cover it.  Disc Coverage app.  Disc ECT. Only FDA approved option left. She wants to defer.    Extensive discussion of clozapine dosing recommendations but we will increase more slowly bc she is so med sensitive.Disc risk low WBC, cardiomyopathy, etc, sedation  (Started clozapine on 12/07/21).    Check CBC with diff every 4 weeks.  now For anxiety and irritability, reactivity incr clozapine to 100 mg HS and 25 mg AM   Likely to work better than incr Viibryd but consider latter.  She  has a high residual anxiety.  It has been impossible to control all of her symptoms simultaneously without causing side effects. Failed various meds.  Discussed side effects of each medicine. Continue   Focalin IR 15 mg BID DT Cost and off label for depression  Try to spread this out if possible for mood.  She feels this is necessary  .  Can't go higher but she often forgets the second dose. Discussed potential benefits, risks, and side effects of stimulants with patient to include increased heart rate, palpitations, insomnia, increased anxiety, increased irritability, or decreased appetite.  Instructed patient to contact office if experiencing any significant tolerability issues.  Would like to avoid BZ if possible.  Lamotrigine 100 mg twice daily to try to reduce polypharmacy and so improve tolerability of clozapine.  consider reduction if clozapine helps.  Failed all reasonable alternatives for anxiety.  Option viibryd, Auvelity  Discussed potential metabolic side effects associated with atypical antipsychotics, as well as potential risk for movement side effects. Advised pt to contact office if movement side effects occur.     Checked B12 folate bc memory complaints.  Normal B12 & folate on 05/25/22  Disc SE meds and this is heightened by the complication of necessary polypharmacy.  Supportive  and cognitive behavioral therapy in terms of dealing with husband's addiction and now new dx metastatic prostate CA..  breaking down tasks into manageable parts.  Grief.   Rec exercise as it would help.  Take advantage of family support  Continue Viibryd 40 mg daily for anxiety and depression.  It helped some Take with food.   and usually with dinner.  Requires frequent FU DT chronic instability.  Wants to schedule monthly.  FU 4 weeks.   Meredith Staggers MD, DFAPA  Please see After Visit Summary for patient specific instructions.    Future Appointments  Date Time Provider Department Center  07/11/2023 10:00 AM Mathis Fare, LCSW [facility] None  07/12/2023  9:45 AM [facility]MM 1 [facility]  07/13/2023  8:00 AM Marisue Brooklyn, PT [facility] None  07/21/2023  8:00 AM Marisue Brooklyn, PT [facility] None  07/24/2023 10:00 AM Mathis Fare, LCSW [facility] None  08/07/2023 11:00 AM Cottle, Steva Ready., MD [facility] None  08/08/2023 10:00 AM Mathis Fare, LCSW [facility] None  08/21/2023  9:00 AM Mathis Fare, LCSW [facility] None  09/04/2023 10:00 AM Mathis Fare, LCSW [facility] None  09/11/2023 10:30 AM Cottle, Steva Ready., MD CP[facility] None  09/18/2023 10:00 AM Mathis Fare, LCSW CP[facility]CP None  09/27/2023  1:00 PM Anabel Halon, MD [facility]  06/05/2024 10:40 AM RPC-ANNUAL WELLNESS VISIT [facility]    No orders of the defined types were placed in this encounter.      -------------------------------

## 2023-07-11 ENCOUNTER — Ambulatory Visit: Payer: 59 | Admitting: Psychiatry

## 2023-07-11 DIAGNOSIS — F3132 Bipolar disorder, current episode depressed, moderate: Secondary | ICD-10-CM | POA: Diagnosis not present

## 2023-07-11 NOTE — Progress Notes (Signed)
[facility] Counselor/Therapist Progress Note  Patient ID: Megan Whitney, MRN: [id number],    Date: 07/11/2023  Time Spent: 55 minutes  Treatment Type: Individual Therapy  Reported Symptoms:  "bipolar", anger, anxiety, depression, obsessiveness, mood fluctuations "but some better", ruminating   Mental Status Exam:  Appearance:   Casual and Neat     Behavior:  Appropriate, Sharing, and Motivated  Motor:  Normal  Speech/Language:   Clear and Coherent  Affect:  Depressed and angry  Mood:  angry, anxious, and depressed  Thought process:  goal directed  Thought content:    Obsessions and Rumination  Sensory/Perceptual disturbances:    WNL  Orientation:  oriented to person, place, time/date, situation, day of week, month of year, year, and stated date of Nov. 5, 2024  Attention:  Fair  Concentration:  Fair  Memory:  WNL  Fund of knowledge:   Good and Fair  Insight:    Good and Fair  Judgment:   Good and Fair  Impulse Control:  Good and Fair   Risk Assessment: Danger to Self:  No Self-injurious Behavior: No Danger to Others: No Duty to Warn:no Physical Aggression / Violence:No  Access to Firearms a concern: No  Gang Involvement:No   Subjective:   Patient reporting in session today symptoms including anxiety particularly around husband's health and his continued drinking, finances, and family relationships.  Ruminating continues and tending to have a lot of "what if's" in her thoughts, and explained these in further detail today, including "what if husband doesn't stop drinking, what if husband dies and I'm not in good health".  Wanted and needed to process these types of questions in session today which she did and seemed to feel a little more grounded.  Husband "had been drinking last night and fell again but not hurt much." Denies any SI.  Very frustrated and at times angry that husband does not always follow medical advice.  Did well and talking through her frustrations  today as well as other strategies for taking better care of herself and ways that might be more helpful for her.  Emphasized patient's overall taking care of herself and making that an ongoing priority, including maintaining contact with supportive friends.  Interventions: Cognitive Behavioral Therapy and Solution-Oriented/Positive Psychology  Long Term Goal: Reduce overall level, frequency, and intensity of the anxiety so that daily functioning is not impaired. Short Term Goal: 1.Increase understanding of the beliefs and messages that produce the worry and anxiety. Strategies: 1.Help client develop reality-based positive cognitive messages/self-talk. 2. Develop a "coping card" or other reminder which coping strategies are recorded for patient's later use.    Diagnosis:   ICD-10-CM   1. Bipolar disorder with moderate depression (HCC)  F31.32      Plan:  Patient continued working today on her symptoms of anxiety, depression, grief, along with some mixed feelings of anger situationally with husband. She continues to show improved motivation and good efforts in therapy although does report some "ups and downs" but also showing some resilience at times.  She does need to continue working with goal-directed behaviors to move in a more positive and healthy direction. Reminded and encouraged patient to be practicing more positive and self affirming behaviors as noted in sessions including: Letting friends be supportive of her as this has proven to be helpful to her over time, remaining focused on making good decisions versus impulsive decisions, believing more in herself and her ability to manage challenging circumstances, refrain from self  sabotaging her goals, stay in contact with supportive people and friends, use of journaling between sessions, setting limits with people who are not supportive, refrain from assuming worst-case scenarios, look for the strengths and the positives within herself, spend  time with her 2 dogs which is very therapeutic for her, positive self talk, and recognize the strength she shows working with goal-directed behaviors to move in a direction that supports her improved emotional health and her overall wellbeing.  Goal review and progress/challenges noted with patient.  Next appointment within 2 to 3 weeks.   Mathis Fare, LCSW

## 2023-07-12 ENCOUNTER — Encounter (HOSPITAL_COMMUNITY): Payer: Self-pay

## 2023-07-12 ENCOUNTER — Ambulatory Visit (HOSPITAL_COMMUNITY)
Admission: RE | Admit: 2023-07-12 | Discharge: 2023-07-12 | Disposition: A | Discharge status: Home / Self Care | Payer: No Typology Code available for payment source | Source: Ambulatory Visit | Attending: Internal Medicine | Admitting: Internal Medicine

## 2023-07-12 DIAGNOSIS — Z0000 Encounter for general adult medical examination without abnormal findings: Secondary | ICD-10-CM

## 2023-07-12 DIAGNOSIS — Z1231 Encounter for screening mammogram for malignant neoplasm of breast: Secondary | ICD-10-CM | POA: Insufficient documentation

## 2023-07-13 ENCOUNTER — Encounter (HOSPITAL_COMMUNITY): Payer: No Typology Code available for payment source

## 2023-07-14 ENCOUNTER — Other Ambulatory Visit: Payer: Self-pay | Admitting: Psychiatry

## 2023-07-14 ENCOUNTER — Encounter (HOSPITAL_COMMUNITY): Payer: No Typology Code available for payment source | Admitting: Physical Therapy

## 2023-07-14 DIAGNOSIS — F314 Bipolar disorder, current episode depressed, severe, without psychotic features: Secondary | ICD-10-CM

## 2023-07-14 NOTE — Telephone Encounter (Signed)
Lf 10/7; lv 11/4 nv 12/2

## 2023-07-17 ENCOUNTER — Telehealth: Payer: Self-pay

## 2023-07-17 NOTE — Telephone Encounter (Signed)
Patient is currently on Ingrezza. She said her insurance is going to stop covering it in March. She said you could switch her to [facility] and she is asking what the dose will be so that she can shop for a new plan. I told her that there were different doses and I didn't know if you could give her an exact answer at this point.

## 2023-07-17 NOTE — Telephone Encounter (Signed)
Patient notified, but said she found a plan that will cover her Ingrezza.

## 2023-07-17 NOTE — Telephone Encounter (Signed)
I dont' know for sure but would estimate it is likely to be 24 mg BID Austedo.

## 2023-07-19 ENCOUNTER — Ambulatory Visit: Payer: No Typology Code available for payment source | Admitting: Psychiatry

## 2023-07-20 ENCOUNTER — Ambulatory Visit (INDEPENDENT_AMBULATORY_CARE_PROVIDER_SITE_OTHER): Payer: 59 | Admitting: Psychiatry

## 2023-07-20 ENCOUNTER — Other Ambulatory Visit: Payer: Self-pay | Admitting: Psychiatry

## 2023-07-20 DIAGNOSIS — G2401 Drug induced subacute dyskinesia: Secondary | ICD-10-CM

## 2023-07-20 DIAGNOSIS — F3132 Bipolar disorder, current episode depressed, moderate: Secondary | ICD-10-CM | POA: Diagnosis not present

## 2023-07-20 NOTE — Progress Notes (Signed)
[facility] Counselor/Therapist Progress Note  Patient ID: Megan Whitney, MRN: [id number],    Date: 07/20/2023  Time Spent: 53 minutes   Treatment Type: Individual Therapy  Reported Symptoms: " bipolar", fear, anxiety, depression, obsessive, ruminating   Mental Status Exam:  Appearance:   Casual     Behavior:  Appropriate, Sharing, and Motivated  Motor:  Normal  Speech/Language:   Clear and Coherent  Affect:  Depressed, anxious  Mood:  anxious and depressed  Thought process:  goal directed  Thought content:    Rumination  Sensory/Perceptual disturbances:    WNL  Orientation:  oriented to person, place, time/date, situation, day of week, month of year, year, and stated date of Nov. 14, 2024  Attention:  Fair  Concentration:  Fair  Memory:  WNL  Fund of knowledge:   Good  Insight:    Good and Fair  Judgment:   Good and Fair  Impulse Control:  Fair   Risk Assessment: Danger to Self:  No Self-injurious Behavior: No Danger to Others: No Duty to Warn:no Physical Aggression / Violence:No  Access to Firearms a concern: No  Gang Involvement:No   Subjective: Patient today reporting anxiety, "bipolar", depression, fear, obsessing, and ruminating at times mostly about husband's illness worsening. An issue recently where palliative care provider was provided information much needed by them as they provide services to patient's husband who has prostate cancer stage 4. Patient struggling some with recent discussion with husband's nurse. Significant fear and anxiety and was able to talk through this in session today which seemed to be helpful, clarifying, and calming for patient. (Not all details included in this note due to patient privacy needs).  Patient showing a lot of strength particular regarding a family/health situation and reviewed at end of session some of the sources of support for her outside of this office particularly regarding close friends.  Will tending to have a  lot of "what if's" in her thought processes but not right is interruptive as an earlier weeks.  Denies any SI.  Continues to be frustrated that husband goes AGAINST MEDICAL ADVICE often but also knows she cannot make him follow directions.  Per treatment goal plan, she does seem to be gaining a better understanding of her worrying and anxiety and at times is working to change her behavior and ways that do not feed her worry and anxiety.  Emphasized with patient some of the strength that she is showing and encouraged her to continue making her own self-care a priority.  Interventions: Cognitive Behavioral Therapy and Ego-Supportive  Long Term Goal: Reduce overall level, frequency, and intensity of the anxiety so that daily functioning is not impaired. Short Term Goal: 1.Increase understanding of the beliefs and messages that produce the worry and anxiety. Strategies: 1.Help client develop reality-based positive cognitive messages/self-talk. 2. Develop a "coping card" or other reminder which coping strategies are recorded for patient's later use.  Diagnosis:   ICD-10-CM   1. Bipolar disorder with moderate depression (HCC)  F31.32      Plan: Today continuing to focus on her symptoms of depression, fear, obsessiveness, "bipolar", anxiety, and ruminating.  Some mixed feelings of anger situationally with her husband.  She does continue to show progress even with some "ups and downs".  Needs to continue her work with goal-directed behaviors to move in a more intentionally positive and hopeful direction.  Encouraged patient to be practicing as much as possible more positive and self affirming behaviors as discussed  in sessions including: Letting friends be supportive of her as this is proven to be helpful to her over time as she allows, remain focused on making good decisions versus impulsive decisions, refrain from self sabotaging her goals or relationships with others, stay in contact with supportive  people and friends, use of journaling between sessions as she feels would be helpful, set limits with people who are not supportive, refrain from assuming worst-case scenarios, look for the strengths and the positives within herself, spend time with her 2 dogs which is very therapeutic for her, use of positive self talk, and recognize the strength she shows when working with goal-directed behaviors to move in a direction that supports her improved emotional health and wellbeing.  Goal review and progress/challenges noted with patient.  Next appointment within 2 to 3 weeks.   Mathis Fare, LCSW

## 2023-07-21 ENCOUNTER — Ambulatory Visit (HOSPITAL_COMMUNITY): Payer: No Typology Code available for payment source

## 2023-07-21 DIAGNOSIS — M545 Low back pain, unspecified: Secondary | ICD-10-CM | POA: Diagnosis not present

## 2023-07-21 DIAGNOSIS — M6281 Muscle weakness (generalized): Secondary | ICD-10-CM

## 2023-07-21 NOTE — Addendum Note (Signed)
Addended byWilhemena Durie S on: 07/21/2023 08:55 AM   Modules accepted: Orders

## 2023-07-21 NOTE — Therapy (Cosign Needed Addendum)
OUTPATIENT PHYSICAL THERAPY THORACOLUMBAR TREATMENT/progress   Patient Name: Megan Whitney MRN: [id number] DOB:1955-11-11, 67 y.o., female Today's Date: 07/21/2023  END OF SESSION:   PT End of Session - 07/21/23 0803     Visit Number 15    Number of Visits 24    Date for PT Re-Evaluation 07/16/23    Authorization Type devoted    PT Start Time 0803    PT Stop Time 0845    PT Time Calculation (min) 42 min    Equipment Utilized During Treatment Gait belt    Activity Tolerance Patient tolerated treatment well    Behavior During Therapy WFL for tasks assessed/performed             Past Medical History:  Diagnosis Date   ADD (attention deficit disorder)    Allergy    Seasonal   Anemia    History of GI blood loss   Anxiety    Arthritis    Atrophy of vagina 10/07/2020   Bipolar 1 disorder (HCC)    Cancer (HCC)    Colon polyps    Depression    Diabetes mellitus (HCC)    pt denies   Edema, lower extremity    Epistaxis    Around 2011 or 2012, required cauterization.    Esophageal stricture    Fracture of superior pubic ramus (HCC) 11/28/2018   GERD (gastroesophageal reflux disease)    Headache(784.0)    Hyperlipidemia    Interstitial cystitis    Joint pain    Lactose intolerance    Lung cancer (HCC) 2002   Neuromuscular disorder (HCC)    Obesity    Osteoarthritis    Palpitations    Sleep apnea    Doesn't use a CPAP   Suicidal ideation 01/20/2020   Swallowing difficulty    Tardive dyskinesia    Past Surgical History:  Procedure Laterality Date   BALLOON DILATION  05/16/2012   Procedure: BALLOON DILATION;  Surgeon: Louis Meckel, MD;  Location: [facility] ENDOSCOPY;  Service: Endoscopy;  Laterality: N/A;   BUNIONECTOMY  2011   COLONOSCOPY     ENTEROSCOPY  05/16/2012   Procedure: ENTEROSCOPY;  Surgeon: Louis Meckel, MD;  Location: [facility] ENDOSCOPY;  Service: Endoscopy;  Laterality: N/A;   JOINT REPLACEMENT     right shoulder durgery 25 yrs ago  1988   TOTAL  HIP ARTHROPLASTY Bilateral 2006, 2008   bilateral   TUBAL LIGATION  1990   WEDGE RESECTION  2002   lung cancer   Patient Active Problem List   Diagnosis Date Noted   DDD (degenerative disc disease), lumbar 03/27/2023   Class 1 obesity due to excess calories without serious comorbidity in adult 11/29/2022   Chronic thoracic back pain 09/22/2022   Neuroleptic-induced tardive dyskinesia 07/20/2022   Intertrigo 05/19/2022   Encounter for general adult medical examination with abnormal findings 03/22/2022   Arthritis of knee 08/17/2021   Polyp of colon 02/04/2021   Seasonal allergies 10/07/2020   Osteopenia 10/07/2020   Gait disturbance 10/07/2020   Hyperlipidemia 06/11/2020   Sleep apnea 06/11/2020   Stricture and stenosis of esophagus 05/16/2012   Hiatal hernia 05/16/2012   Dysphagia 05/15/2012   Depression    Bipolar disorder (HCC)    GERD (gastroesophageal reflux disease) 09/14/2010   History of colonic polyps 09/14/2010    PCP: Trena Platt  REFERRING PROVIDER: Anabel Halon, MD  REFERRING DIAG: M51.36 (ICD-10-CM) - DDD (degenerative disc disease), lumbar  Rationale for Evaluation and Treatment: Rehabilitation  THERAPY DIAG:  Muscle weakness (generalized)  Lumbar pain  ONSET DATE: chronic with acute exacerbation last month.    SUBJECTIVE:                                                                                                                                                                                           SUBJECTIVE STATEMENT:   Still has a lot of pain toward the end of the day. Hasn't been doing exercises preventatively. She will use heating pad and do her exercises when she feels her pain. Does feel relief after doing those self care strategies.  Progress note 06/16/23: PT states that she does not feel therapy is helping, she has not done her exercises as her husband came home from the hospital and she has been dealing with his illness.  After  reassessment pt states that she feels encouraged.      Eval:   Pt states that she is aware that she tends to sit and list to the left side but is unable to correct this.   Pt states that her pain is worse in the afternoon.  By late afternoon she can not be up longer than five minutes.  The pain is achy and is mainly on the right side of her back, the more she does the worse it hurts and it will radiate to the side.    PERTINENT HISTORY:   67 y.o. female with past medical history of bipolar disorder, ADD, anxiety, OSA, GERD, HLD and osteopenia and lumbar pain   PAIN: 06/16/23 now 0/ worst 7-8  Are you having pain? Yes: NPRS scale: 1/10, worst is an 7-8/10; best 0/10 in the mornings.  Pain location: Rt mid and lower back  Pain description: achy Aggravating factors: weight bearing Relieving factors: sitting , heating pad   PRECAUTIONS: None  RED FLAGS: None   WEIGHT BEARING RESTRICTIONS: No  FALLS:  Has patient fallen in last 6 months? No  LIVING ENVIRONMENT: Lives with: lives with their spouse Stairs: No Has following equipment at home: None  OCCUPATION: retired   PLOF: Independent with community mobility without device  PATIENT GOALS: less pain   NEXT MD VISIT: 6 months   OBJECTIVE:    PATIENT SURVEYS:  FOTO 22  SENSATION: WFL  POSTURE: rounded shoulders, decreased lumbar lordosis, increased thoracic kyphosis, and weight shift left   LUMBAR ROM:   AROM eval 06/16/23  Flexion Flexed at 10 degrees Flexed at 5 degrees   Extension Able to go to neutral  5 degrees   Right lateral flexion    Left lateral flexion    Right rotation  Left rotation     (Blank rows = not tested)  *did not test forward flexion due to osteopenia. LOWER EXTREMITY MMT:    MMT Right eval 06/16/23 Left eval 06/16/23  Hip flexion 5/5 5/5 4/5 5/5  Hip extension 4/5 4/5 4/5 4/5  Hip abduction 5/5 5/5 3+/5  3+/5  Hip adduction      Hip internal rotation      Hip external rotation       Knee flexion 4/5 5/5 5/5 5/5  Knee extension 5/5 5/5 5/5 5/5  Ankle dorsiflexion 4/5 5/5 5/5 5/5  Ankle plantarflexion      Ankle inversion      Ankle eversion       (Blank rows = not tested)  FUNCTIONAL TESTS:  30 seconds chair stand test: 6 in 30 seconds (8 is poor for pt age and sex)  10/11/ 24 ; 11 in 30 seconds ; 11 is below average for age and sex ; 39 is average  2 minute walk test: 394 feet pt leaning to the left while walking  06/16/23 2 minute wt :  452 ft with  slight leaning to the left  Single leg stance on eval: Lt: 12, RT 0 06/01/23: Rt 10.49 sec, 23.36 sec 1011/24 RT: 14; LT 15   TODAY'S TREATMENT:                                                                                                                              DATE: 07/21/23 NuStep warm up: 6' seat 6 Seated: Thoracic extension over back of chair x10 GTB horizontal ABD pull apart 3x5  Bilateral side bending with arm OH reach 2x5  Thoracic rotation bilaterally, arms on chest, synched with breath, rotate further during exhale Standing:   GTB rows 2x10   Body craft Right arm row with thoracic rotation, plate 2, Z61 (left foot foward)  Body craft standing thoracic rotation to right, left arm pulled by body craft , plate 2, W96 (right foot foward)    07/07/23 Nustep warm up: 5' seat 6 Seated: Thoracic extension over back of chair x10 GTB horizontal ABD pull apart 3x5 GTB rows 3x5 Standing:  Right side bending with left arm OH reach 2x5  Standing extension over parallel bar 2x5  RTB Palfof press 2x5 bilaterally   06/21/23 Recumbent bike, seat 6, level 1, 5' Seated hamstring stretch x 30" x 3 Bridging with hip abd, RTB x 10 x 2 x 3" LTR x 1' Lunge to stretch hip flexors x 30" x 2 Seated marches, neutral spine x 10 x 2 Seated alternating knee ext, neutral spine x 10 x 2  06/16/23 Reassessment see above: Sit to stand x 10  instruction for proper bed mobility Seated resisted hip abduction  x  10  Prone hip extension x 10  06/07/23 UBE L1, 2 min forward, 2 min backward Thoracic extension into chair x10 Doorway pec stretch x30 sec low, mid, high  Deadlift red TB 2x10 Modified stagged stance dead lift 5# handweight on 12" step  Shoulder ext green TB + marching x10 Shoulder ext green TB x10   06/05/23 UBE L1, 2 min forward, 2 min backward Thoracic extension into chair x10 Doorway pec stretch x30 sec low, mid, high Against wall scap squeeze + cervical retraction x10 Against wall shoulder ER 2x10 Against wall "W" 2x10 Against wall glute med iso 2x10 Against wall glute max iso x10 Standing bow and arrow red TB 2x10 Standing shoulder ext red TB + alternating marching 2x10  06/01/23 UBE L1, 2 min forward, 2 min backward Thoracic extension into chair x10 Thoracic ext + rotation to R x10 Doorway pec stretch 2x 30 sec low, mid, high Against wall scap squeeze + cervical retraction x10 Against wall glute med iso x10 Against wall "W" 2x10 Standing reactive iso row red TB 2x10 Standing shoulder ext red TB 2x10 Standing shoulder ext + L hip flexion 2x10 Against wall D2 shoulder flexion red TB 2x10   05/30/23 Standing L stretch x30 sec; with lateral flexion x30 sec R&L Lateral shift into wall 3x30 sec Prone  "I" with scap squeeze 2x10  "W" 2x10  "T" 2x10 Manual therapy  STM & TPR R thoracolumbar paraspinal Standing  Row red TB 2x10  Shoulder ext red TB 2x10  Against wall scap squeeze + cervical retraction 2x10  PATIENT EDUCATION:  Education details: HEP updates. Self massage using ball to help with muscle spasm Person educated: Patient Education method: Explanation, Verbal cues, and Handouts Education comprehension: verbalized understanding and returned demonstration  HOME EXERCISE PROGRAM: Access Code: [facility] URL: [email or web address] Date: 06/01/2023 Prepared by: Vernon Prey April Kirstie Peri  Exercises - seated posture with towel roll  - 1 x  daily - 7 x weekly - 1 sets - 1 reps - Seated Upper Trapezius Stretch  - 2 x daily - 7 x weekly - 1 sets - 5 reps - 20 sec hold - Seated Cervical Rotation AROM  - 2 x daily - 7 x weekly - 1 sets - 10 reps - 5 sec hold - Seated Scapular Retraction  - 2 x daily - 7 x weekly - 1 sets - 10 reps - 5 sec hold - Seated Gentle Upper Trapezius Stretch  - 2 x daily - 7 x weekly - 1 sets - 3 reps - 20" hold - Shoulder extension with resistance - Neutral  - 1 x daily - 7 x weekly - 2 sets - 10 reps - Standing Shoulder Row Reactive Isometric  - 1 x daily - 7 x weekly - 2 sets - 10 reps - Resistance Pulldown with March  - 1 x daily - 7 x weekly - 2 sets - 10 reps - Isometric Gluteus Medius at Wall  - 1 x daily - 7 x weekly - 2 sets - 10 reps - Standing Shoulder Single Arm PNF D2 Flexion with Resistance  - 1 x daily - 7 x weekly - 2 sets - 10 reps 06/16/23 - Supine Single Knee to Chest Stretch  - 1 x daily - 7 x weekly - 1 sets - 3 reps - 30 seconds hold - Supine Double Knee to Chest  - 1 x daily - 7 x weekly - 1 sets - 3 reps - 30 seconds  hold - Seated Hip Abduction with Resistance  - 1 x daily - 7 x weekly - 1 sets - 10 reps - 5 seconds  hold ASSESSMENT:  CLINICAL IMPRESSION:  Interventions today were geared towards thoracic mobility. Patient tolerated all activities without worsening of symptoms. Patient really enjoyed new exercises with the body craft weight tower. Patient demonstrated good thoracic rotation with all new exercises. Rotates more to the left than right which is to be expected with left sided trunk lean. Provided mod amount of multimodal cueing to ensure correct execution of activity with good carry-over. Patient gave verbal understanding. Noticeable left sided shift in seated and standing. Discussed HEP compliance for preventative treatment. To date, skilled PT is required to address mobility, strength, and postural impairments and improve function.   GOALS: Goals reviewed with patient?  No  SHORT TERM GOALS: Target date: 05/25/23  Pt to be I in HEP in order to decrease her pain to no greater than a 6/10 Baseline: 06/05/23: 7-8/10 Goal status: on-going  2.  Pt to improve her core strength to be able to stand in an upright position  Baseline:  06/05/23: Able to perform with cueing Goal status: MET  3.  PT to be able to single leg stance for 10 seconds on her RT LE to decrease risk of falling  Baseline:  Goal status: MET (06/01/23)   LONG TERM GOALS: Target date: 08/05/23  Pt to be I in an advanced HEP in order to decrease her pain to no greater than a 3/10 Baseline:  Goal status: on-going  2.  Pt to improve her core strength to be able to stand/sit without listing to the left for 30 minutes. Baseline:  Goal status: on-going  3.  PT to be able to single leg stance for 20 seconds on her RT LE to decrease risk of falling Baseline:  Goal status: MET (06/01/23)   PLAN:  PT FREQUENCY: 2x/week  PT DURATION: 6 weeks  PLANNED INTERVENTIONS: Therapeutic exercises, Therapeutic activity, Balance training, Gait training, Patient/Family education, Self Care, and Manual therapy.  PLAN FOR NEXT SESSION:   Assess daily compliance to HEP for preventative maintenance. Work on trunk strength, postural exercise, and thoracic mobility.   10:59 AM, 07/21/23 Karna Christmas, SPT  " I agree with the following treatment note after reviewing documentation. This session was performed under the supervision of a licensed clinician."    9:20 AM, 07/22/23 Amy Small Lynch MPT [facility]  [phone]

## 2023-07-24 ENCOUNTER — Ambulatory Visit: Payer: No Typology Code available for payment source | Admitting: Psychiatry

## 2023-07-27 ENCOUNTER — Encounter (HOSPITAL_COMMUNITY): Payer: No Typology Code available for payment source | Admitting: Physical Therapy

## 2023-08-02 ENCOUNTER — Other Ambulatory Visit: Payer: Self-pay

## 2023-08-02 ENCOUNTER — Encounter: Payer: Self-pay | Admitting: Internal Medicine

## 2023-08-02 ENCOUNTER — Ambulatory Visit: Payer: Self-pay | Admitting: Internal Medicine

## 2023-08-02 ENCOUNTER — Emergency Department (HOSPITAL_COMMUNITY)
Admission: EM | Admit: 2023-08-02 | Discharge: 2023-08-02 | Disposition: A | Discharge status: Home / Self Care | Payer: No Typology Code available for payment source | Attending: Emergency Medicine | Admitting: Emergency Medicine

## 2023-08-02 ENCOUNTER — Encounter (HOSPITAL_COMMUNITY): Payer: Self-pay

## 2023-08-02 DIAGNOSIS — E119 Type 2 diabetes mellitus without complications: Secondary | ICD-10-CM | POA: Diagnosis not present

## 2023-08-02 DIAGNOSIS — R42 Dizziness and giddiness: Secondary | ICD-10-CM | POA: Diagnosis not present

## 2023-08-02 DIAGNOSIS — R002 Palpitations: Secondary | ICD-10-CM | POA: Diagnosis not present

## 2023-08-02 DIAGNOSIS — Z79899 Other long term (current) drug therapy: Secondary | ICD-10-CM | POA: Diagnosis not present

## 2023-08-02 DIAGNOSIS — F319 Bipolar disorder, unspecified: Secondary | ICD-10-CM | POA: Diagnosis not present

## 2023-08-02 LAB — BASIC METABOLIC PANEL
Anion gap: 7 (ref 5–15)
BUN: 17 mg/dL (ref 8–23)
CO2: 26 mmol/L (ref 22–32)
Calcium: 9.8 mg/dL (ref 8.9–10.3)
Chloride: 106 mmol/L (ref 98–111)
Creatinine, Ser: 0.85 mg/dL (ref 0.44–1.00)
GFR, Estimated: >60 mL/min (ref >60)
Glucose, Bld: 101 mg/dL — ABNORMAL HIGH (ref 70–99)
Potassium: 4.2 mmol/L (ref 3.5–5.1)
Sodium: 139 mmol/L (ref 135–145)

## 2023-08-02 LAB — URINALYSIS, ROUTINE W REFLEX MICROSCOPIC
Bilirubin Urine: NEGATIVE
Glucose, UA: NEGATIVE mg/dL
Hgb urine dipstick: NEGATIVE
Ketones, ur: NEGATIVE mg/dL
Nitrite: NEGATIVE
Protein, ur: NEGATIVE mg/dL
Specific Gravity, Urine: 1.016 (ref 1.005–1.030)
pH: 6 (ref 5.0–8.0)

## 2023-08-02 LAB — CBC
HCT: 39.8 % (ref 36.0–46.0)
Hemoglobin: 13.2 g/dL (ref 12.0–15.0)
MCH: 30.8 pg (ref 26.0–34.0)
MCHC: 33.2 g/dL (ref 30.0–36.0)
MCV: 92.8 fL (ref 80.0–100.0)
Platelets: 188 10*3/uL (ref 150–400)
RBC: 4.29 MIL/uL (ref 3.87–5.11)
RDW: 14.8 % (ref 11.5–15.5)
WBC: 8 10*3/uL (ref 4.0–10.5)
nRBC: 0 % (ref 0.0–0.2)

## 2023-08-02 NOTE — Telephone Encounter (Signed)
Recommend ER or urgent care visit for patient

## 2023-08-02 NOTE — ED Triage Notes (Signed)
Pt reports hx of heart palpitations and has been having them over the past few days.  PT reports she went to bed at 2130 last night feeling fine and woke up feeling dizzy and her PCP told her to come to the ER.

## 2023-08-02 NOTE — ED Provider Notes (Signed)
[place] EMERGENCY DEPARTMENT AT [facility] Provider Note   CSN: [id number] Arrival date & time: 08/02/23  1045     History  Chief Complaint  Patient presents with   Dizziness    Megan Whitney is a 67 y.o. female.  67 year old female with history of diabetes who presents to the emergency department with palpitations and dizziness.  Says that she has chronic palpitations.  Typically will last for several minutes at a time and then resolved.  Has become more frequent in the past 2 to 3 days with approximately 3-4 episodes per day.  Today had a sustained episode and decided to come into the emergency department.  Also has been feeling lightheaded during these episodes as well.  No chest pain or shortness of breath.  Denies any other symptoms.  No alcohol or excessive caffeine use.  No other recreational substance use, did have her clozapine increased several weeks ago.  No syncopal events.  No family history of arrhythmia.       Home Medications Prior to Admission medications   Medication Sig Start Date End Date Taking? Authorizing Provider  atorvastatin (LIPITOR) 20 MG tablet Take 1 tablet (20 mg total) by mouth every evening. 01/02/23  Yes Anabel Halon, MD  cloZAPine (CLOZARIL) 100 MG tablet Take 1 tablet (100 mg total) by mouth at bedtime. 07/10/23  Yes Cottle, Steva Ready., MD  cloZAPine (CLOZARIL) 25 MG tablet Take 1 tablet (25 mg total) by mouth every morning. 07/10/23  Yes Cottle, Steva Ready., MD  Ferrous Gluconate (IRON 27 PO) Take by mouth.   Yes [provider]  FOCALIN 10 MG tablet TAKE 1 AND 1/2 TABLETS BY MOUTH TWICE DAILY. Patient taking differently: Take 10 mg by mouth See admin instructions. Take 20 mg daily 07/14/23  Yes Cottle, Steva Ready., MD  acetaminophen (TYLENOL) 650 MG CR tablet Take 1,300 mg by mouth as needed for pain.    [provider]  INGREZZA 60 MG capsule TAKE 1 CAPSULE BY MOUTH TWICE A DAY 07/20/23   Cottle, Steva Ready.,  MD  ketoconazole (NIZORAL) 2 % cream Apply 1 Application topically daily. 07/13/22   Anabel Halon, MD  lamoTRIgine (LAMICTAL) 100 MG tablet Take 1 tablet (100 mg total) by mouth 2 (two) times daily. 06/08/23   Cottle, Steva Ready., MD  Melatonin 10 MG CAPS Take by mouth at bedtime as needed.    [provider]  pantoprazole (PROTONIX) 40 MG tablet TAKE (1) TABLET BY MOUTH TWICE DAILY. 04/21/23   Hilarie Fredrickson, MD  promethazine-dextromethorphan (PROMETHAZINE-DM) 6.25-15 MG/5ML syrup Take 5 mLs by mouth 4 (four) times daily as needed. 11/08/22   Particia Nearing, PA-C  Vilazodone HCl (VIIBRYD) 40 MG TABS Take 1 tablet (40 mg total) by mouth daily. 06/08/23   Cottle, Steva Ready., MD      Allergies    Azithromycin, Penicillins, and Adhesive [tape]    Review of Systems   Review of Systems  Physical Exam Updated Vital Signs BP 117/71   Pulse 71   Temp 98.8 F (37.1 C) (Oral)   Resp 15   Ht 5' (1.524 m)   Wt 59.9 kg   SpO2 96%   BMI 25.78 kg/m  Physical Exam Vitals and nursing note reviewed.  Constitutional:      General: She is not in acute distress.    Appearance: She is well-developed.  HENT:     Head: Normocephalic and atraumatic.  Right Ear: External ear normal.     Left Ear: External ear normal.     Nose: Nose normal.  Eyes:     Extraocular Movements: Extraocular movements intact.     Conjunctiva/sclera: Conjunctivae normal.     Pupils: Pupils are equal, round, and reactive to light.  Cardiovascular:     Rate and Rhythm: Normal rate and regular rhythm.     Heart sounds: No murmur heard. Pulmonary:     Effort: Pulmonary effort is normal. No respiratory distress.     Breath sounds: Normal breath sounds.  Musculoskeletal:     Cervical back: Normal range of motion and neck supple.     Right lower leg: No edema.     Left lower leg: No edema.  Skin:    General: Skin is warm and dry.  Neurological:     Mental Status: She is alert and oriented to person,  place, and time. Mental status is at baseline.     Cranial Nerves: No cranial nerve deficit.     Sensory: No sensory deficit.     Motor: No weakness.     Comments: Able to walk without assistance.  Cranial nerves II through XII intact.  No nystagmus noted.  Normal finger-to-nose and heel-to-shin.  Psychiatric:        Mood and Affect: Mood normal.     ED Results / Procedures / Treatments   Labs (all labs ordered are listed, but only abnormal results are displayed) Labs Reviewed  BASIC METABOLIC PANEL - Abnormal; Notable for the following components:      Result Value   Glucose, Bld 101 (*)    All other components within normal limits  URINALYSIS, ROUTINE W REFLEX MICROSCOPIC - Abnormal; Notable for the following components:   Color, Urine AMBER (*)    APPearance CLOUDY (*)    Leukocytes,Ua TRACE (*)    Bacteria, UA RARE (*)    All other components within normal limits  CBC    EKG EKG Interpretation Date/Time:  Wednesday August 02 2023 11:54:46 EST Ventricular Rate:  81 PR Interval:  145 QRS Duration:  85 QT Interval:  375 QTC Calculation: 436 R Axis:   52  Text Interpretation: Sinus rhythm Probable left atrial enlargement Confirmed by Megan Whitney [phone]) on 08/02/2023 1:07:42 PM  Radiology No results found.  Procedures Procedures    Medications Ordered in ED Medications - No data to display  ED Course/ Medical Decision Making/ A&P                                 Medical Decision Making Amount and/or Complexity of Data Reviewed Labs: ordered.   Megan Whitney is a 67 y.o. female with comorbidities that complicate the patient evaluation including diabetes who presents to the emergency department with palpitations and dizziness.   Initial Ddx:  Arrhythmia, presyncope, dehydration, stroke, vertigo  MDM/Course:  Patient presents to the emergency department with palpitations and dizziness.  No syncopal events.  These appear to be paroxysmal bouts.  On  exam does not have any murmurs rubs or gallops.  No focal neurologic deficits ago and is able to walk without any assistance.  Given the description of the symptoms feel that she likely is having a paroxysmal arrhythmia.  EKG without concerning findings such as Brugada, long QT, or WPW.  She had basic lab work that was unremarkable.  Will have her follow-up with cardiology as an  outpatient.  Feel that she may benefit from Holter monitor.    This patient presents to the ED for concern of complaints listed in HPI, this involves an extensive number of treatment options, and is a complaint that carries with it a high risk of complications and morbidity. Disposition including potential need for admission considered.   Dispo: DC Home. Return precautions discussed including, but not limited to, those listed in the AVS. Allowed pt time to ask questions which were answered fully prior to dc.  Records reviewed Outpatient Clinic Notes The following labs were independently interpreted: Chemistry and show no acute abnormality I personally reviewed and interpreted cardiac monitoring: normal sinus rhythm  I personally reviewed and interpreted the pt's EKG: see above for interpretation  I have reviewed the patients home medications and made adjustments as needed Social Determinants of health:  Elderly  Portions of this note were generated with Scientist, clinical (histocompatibility and immunogenetics). Dictation errors may occur despite best attempts at proofreading.           Final Clinical Impression(s) / ED Diagnoses Final diagnoses:  Palpitations  Lightheadedness    Rx / DC Orders ED Discharge Orders          Ordered    Ambulatory referral to Cardiology        08/02/23 1407              Rondel Baton, MD 08/02/23 2019

## 2023-08-02 NOTE — ED Notes (Signed)
Pt placed on cardiac monitor 

## 2023-08-02 NOTE — Telephone Encounter (Signed)
Copied from CRM [id number]. Topic: Clinical - Red Word Triage >> Aug 02, 2023  8:31 AM Dondra Prader A wrote: Red Word that prompted transfer to Nurse Triage: Pt is stating that she is having heart palpations that comes and goes, dizziness  Chief Complaint: palpitations Symptoms: fast heart rate, skips beats, nausea, sweats, light headed Frequency: comes and goes but worst at night when lying down Pertinent Negatives: Patient denies no SOB or chest pain Disposition: [x] ED /[] Urgent Care (no appt availability in office) / [] Appointment(In office/virtual)/ []  [facility]/ [] Home Care/ [] Refused Recommended Disposition /[] [facility]/ []  Follow-up with PCP Additional Notes: pt states has been going on for 3-4 days: has had this in the past but not this long and it has never caused light headedness.  Pt states she can feel her heart palpitations but does not describe them as pounding. Nurse care advice given to go to ED now: pt verbalized understanding stating she will have her husband drive her there now.  Reason for Disposition  Dizziness, lightheadedness, or weakness  Answer Assessment - Initial Assessment Questions 1. DESCRIPTION: "Please describe your heart rate or heartbeat that you are having" (e.g., fast/slow, regular/irregular, skipped or extra beats, "palpitations")     Palpitations, fast and skipping beats - 2. ONSET: "When did it start?" (Minutes, hours or days)      3 to 4 days ago 3. DURATION: "How long does it last" (e.g., seconds, minutes, hours)     Comes and goes 4. PATTERN "Does it come and go, or has it been constant since it started?"  "Does it get worse with exertion?"   "Are you feeling it now?"     Worst at night when lying down, if palpitation while standing become lightheaded 5. TAP: "Using your hand, can you tap out what you are feeling on a chair or table in front of you, so that I can hear?" (Note: not all patients can do this)       N/a 6. HEART  RATE: "Can you tell me your heart rate?" "How many beats in 15 seconds?"  (Note: not all patients can do this)       N/a 7. RECURRENT SYMPTOM: "Have you ever had this before?" If Yes, ask: "When was the last time?" and "What happened that time?"      Has happened in the past but not this long and has never caused dizzines 8. CAUSE: "What do you think is causing the palpitations?"     unknonwn 9. CARDIAC HISTORY: "Do you have any history of heart disease?" (e.g., heart attack, angina, bypass surgery, angioplasty, arrhythmia)      N/a 10. OTHER SYMPTOMS: "Do you have any other symptoms?" (e.g., dizziness, chest pain, sweating, difficulty breathing)       Nausea, sweats, no pain 11. PREGNANCY: "Is there any chance you are pregnant?" "When was your last menstrual period?"       N/a  Protocols used: Heart Rate and Heartbeat Questions-A-AH

## 2023-08-02 NOTE — Discharge Instructions (Signed)
You were seen for your palpitations in the emergency department.   At home, please stay well-hydrated and avoid excessive caffeine or alcohol.    Follow-up with your primary doctor in 2-3 days regarding your visit.  Cardiology will be calling you regarding an appointment within the next 72 hours.  You may contact them if you do not hear from them in that time using the information in this packet.  Please talk to them to see if you need an outpatient (Holter) monitor.  Return immediately to the emergency department if you experience any of the following: Chest pain, shortness of breath, fainting, or any other concerning symptoms.    Thank you for visiting our Emergency Department. It was a pleasure taking care of you today.

## 2023-08-04 ENCOUNTER — Encounter: Payer: Self-pay | Admitting: Internal Medicine

## 2023-08-07 ENCOUNTER — Encounter: Payer: Self-pay | Admitting: Psychiatry

## 2023-08-07 ENCOUNTER — Ambulatory Visit (INDEPENDENT_AMBULATORY_CARE_PROVIDER_SITE_OTHER): Payer: No Typology Code available for payment source | Admitting: Psychiatry

## 2023-08-07 DIAGNOSIS — G3184 Mild cognitive impairment, so stated: Secondary | ICD-10-CM

## 2023-08-07 DIAGNOSIS — F5105 Insomnia due to other mental disorder: Secondary | ICD-10-CM

## 2023-08-07 DIAGNOSIS — R7989 Other specified abnormal findings of blood chemistry: Secondary | ICD-10-CM | POA: Diagnosis not present

## 2023-08-07 DIAGNOSIS — F900 Attention-deficit hyperactivity disorder, predominantly inattentive type: Secondary | ICD-10-CM | POA: Diagnosis not present

## 2023-08-07 DIAGNOSIS — R251 Tremor, unspecified: Secondary | ICD-10-CM | POA: Diagnosis not present

## 2023-08-07 DIAGNOSIS — F3132 Bipolar disorder, current episode depressed, moderate: Secondary | ICD-10-CM | POA: Diagnosis not present

## 2023-08-07 DIAGNOSIS — F411 Generalized anxiety disorder: Secondary | ICD-10-CM

## 2023-08-07 DIAGNOSIS — F314 Bipolar disorder, current episode depressed, severe, without psychotic features: Secondary | ICD-10-CM | POA: Diagnosis not present

## 2023-08-07 DIAGNOSIS — G2401 Drug induced subacute dyskinesia: Secondary | ICD-10-CM

## 2023-08-07 DIAGNOSIS — Z79899 Other long term (current) drug therapy: Secondary | ICD-10-CM

## 2023-08-07 MED ORDER — CLOZAPINE 25 MG PO TABS
25.0000 mg | ORAL_TABLET | Freq: Two times a day (BID) | ORAL | 1 refills | Status: DC
Start: 2023-08-07 — End: 2023-08-08

## 2023-08-07 MED ORDER — FOCALIN 10 MG PO TABS: 15.0000 mg | ORAL_TABLET | Freq: Two times a day (BID) | ORAL | 0 refills | Status: DC

## 2023-08-07 NOTE — Telephone Encounter (Signed)
scheduled

## 2023-08-07 NOTE — Progress Notes (Signed)
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 and biting tongue.  Sometimes hard to talk.  Drooling.  When tries to increase clozapine slurred speech and severe dizziness.   Mood is a little better.   Sleeping 8-9 hours.  So much better sleep with clozapine.   Still has anxiety, generalized. Plan: To minimize polypharmacy and improve tolerabilty:  Reduce Equetro to 1 of the 100 mg capsule at night for 1 week, then stop it. Wait 1 week then stop the carbamazepine chewable. Wait 1 more week then reduce clonidine to 1/2 at night for 1 week then stop it. Plan: Started clozapine  and continue 25 mg for now bc hasn't tolerated more so far.  02/08/22 appt noted: Mouth movements and biting tongue.  Sores on cheek with constant chewing movements. Sometimes hard to talk. Hypersalivation gets worse as day progresses. Sometimes balance problems.  Constipation managed.   Sleep very well.  8-9 hours. Off Equetro and on clozapine. Still has depression and anxiety without much change Plan: Started clozapine  and continue 25 mg for now bc hasn't tolerated more and need to start Ingrezza 40 mg for TD.  03/23/2022  appointment with the following noted: Several phone calls since here.  Has gotten up to clozapine 37.5 mg nightly. Continues Focalin 15 mg twice daily, lamotrigine 200 mg twice daily, lithium 150 nightly. Started Ingrezza 40 mg daily. Ingrezza amazing difference but even with grant of $10000 can't afford it.  Not biting mouth and mouth less sore.  Less mouth movements but not gone Emotionally not real well with anxiety and depression and crying spells.  Also some irritability and anger.  Easily triggered anger. Sleep more broken with Ingrezza HS but 8-9 hours. Can be sedated if gets up early with slurred speech but not if full night sleep. Balance better off Equetro. Plan: Started clozapine but needs to increase bc minimal effect and better tolerance, so increase to 50 mg HS  04/05/22 appt noted: Increased clozapine to 50 mg HS.  Some groggy in the AM.  One day was dizzy.  Otherwise on occasion.  Takes it right before bed.   Still depression, hopeless, irritability and anger.  Some periods of racing thoughts.  Sometimes recognizes hypomanic episodes and somethimes doesn't recognize. Dog is very sick and  H with bone CA.  Not crying on clozapine as much. GERD and needs surgery for hiatal hernia. Signed up for water aerobics. Plan: Started clozapine but needs to increase bc minimal effect and better tolerance, so increase to 75 mg HS (Started clozapine on 12/07/21) Reduce librium 5 mg HS and plan to stop  05/10/22 appt med: TD partially better with Ingrezza 40.  Has  a grant.   Increased clozapine 75 mg HS, reduced Librium to 5 mg HS. Tolerated OK. Depression a little better.  Irritability still high.  Poor memory and easily confused. Sleep is pretty good and is better and needs to sleep longer.   Plan: DC librium Worsening TD partial response on 40 mg Ingrezza, increase to 60 mg daily   Continue clozapine 100 mg HS  05/17/2022 phone call complaining of sleeping more and feeling sleepy and foggy  thinking also dropping some things and drooling.  She was instructed to reduce the clozapine to 75 mg nightly to see if that was the problem. 05/23/2022 phone call asking to increase Ingrezza from 60 to 80 mg daily.  It was agreed. 06/16/2022 phone call stating she had a bad manic episode the week prior and is still feeling excessively sedated.  Also having hand tremors. Instructed to stop lithium and continue clozapine 75 mg nightly.  She is very med sensitive but we have few options left to treat her unstable bipolar disorder. 06/21/2022 phone call: After complaining of excessive sleepiness and excessive sleeping she is now complaining of insomnia.  07/07/22 appt noted: Current psychiatric medications include clozapine 75 mg nightly, Focalin 15 mg twice daily, lamotrigine 100 mg twice daily Ingrezza 80 mg daily.  She stopped lithium Has a list of concerns: SE drooling bad.   Still have mouth movements with the increase Ingrezza 80 mg daily but has stopped the tongue chewing. Goes to bed 9 PM and to sleep in 30 mins and awaken in the AM about 830 and hard to wake up.  Not napping in the day.  Getting enough sleep.   Notices Focalin kicking in when takes it. Mood depressed but not as bad.  Still some irritability, anger.  3 week ago bad manic anger lasting 3-4 days.  Not sure how her sleep was at the time. Some crying and poor impulse control.   PCP wanted 2nd opinion from neuro on ? PD, Dr. Arbutus Leas Nov 15. Plan: No med changes pending neurologic appointment  07/20/2022 neurology appointment Dr. Lurena Joiner Tat.  Diagnosed TD.  Assessment as follows: 1.  Tardive dyskinesia -The patients symptoms are most consistent with tardive dyskinesia.  She has had exposure to typical and atypical antipsychotic medication.  TD is a heterogeneous syndrome depending on a subtle balance between several neurotransmitters in the brain, including DA receptor blockade and hypersensitivity of DA and GABA receptors. -pt on  ingrezza since 02/2022 and both she and notes from Dr. Jennelle Human indicate great benefit.  2.  Tremor             -Largely resolved off of lithium and vraylar (vraylar d/c in 12/2021)             -She has very minor left hand tremor.  Did tell her that Ingrezza can occasionally cause parkinsonism, but I did not see a significant degree of that today.             -I did reassure her today that I saw no evidence of idiopathic Parkinson's disease.  She was reassured.  3.  Bipolar d/o             -difficult to control per records             -sees Dr. Jennelle Human frequently  4.  Discussed with patient that she really does not need neurologic follow-up at this point in time.  She was happy to hear this.  08/08/2022 appointment noted: No med changes. Still having a lot of anxiety and worries too much. Worse than depression.  No mania since here.  No sig avoidance.   Hard time concentration. Still having irritability and anger. SE drowsy with clozapine in AM and hard to function until about 10 AM.  Takes it about 8 PM and then goes to bed.   No falling but is shuffling more since here.   Sleep 10 hours.    09/07/22 appt noted:   Consistently on clozapine 75 mg HS.  Too drowsy if takes 25 mg in AM. Doing so so .  A lot of anxiety, anger, irritation.   Avg 8-9 hours of sleep and pushes herself to get up .  Hard to function in am until 11 or 12 noon. Ingreza 40 mg BID still some mouth movements and biting tongue.  Is better with Ingrezza but not gone.   SE consitpation and drowsy.   She is aware of the difficulty finding balance between aenough med to manage her sx and not so much to cause intolerable SE.  Disc dosing of her meds. Plan: Augment clozapine with fluvoxamine 25 mg HS  10/11/22 appt noted: : Current psych meds: Clozapine 75 mg nightly, Focalin 15 mg twice daily, fluvoxamine 25 mg nightly, lamotrigine 100 mg twice daily, Ingrezza 40 mg twice daily No noticeable change with fluvoxamine. Drooling worse  in am and stupor until about 11 AM.   Mood still not good , angry and irritable a lot.  H acuses he rof going off her meds.  Less mouth movements and biting tonue. Sleep 9-10 hours. Anxiety still through the roof. Tremor better right now unless weak.   H prostate CA with bone mets and on pain meds. Plan: Augment clozapine with fluvoxamine but increase 50mg  HS also to help anxiety, irritability  11/09/2022 appointment noted: Added fluvoxamine 50 mg HS.  And is less anxious and more grounded.  Half as irritable as before fluvoxamine.   Down side is shuffling steps seem worse.  Not dizzy usually but balance isn't good.  Gets better after the day progresses.    Overall does feel improved.   Trembling better and mouth movements much better but drooling is bad nothing seems to help that. Drools in public is embarrassing. Tired a lot better as day progresses.   Plan: Augment clozapine with fluvoxamine but REDUCE TO 37.5 mg mg HS bc more shuffling of feet .  And reduced clozapine to 50 mg daily.  But did help anxiety, irritability Ingrezza for TD helped stop tongue chewing but still some mouth movements at 80 mg, which was started 05/23/22.  Shuffling a little more and can't reduce Ingrezza.  Split ingrezza 40 BID  12/12/22 appt noted: Made changes as above.  Asks about increasing Focalin 20 BID bc lack of energy and motivation and productivity.  No SE.   No jittery,  HA. Usally sleep Is good but not always. Still shuffling if gets up at night or before morning Focalin.  Then it clears up.  No change in shuffling since here last visit.  Other day used  H's walker.  Like losing balance.   Mouth movements still there but not nearly as bad.  Worse if forgets a dose of Ingrezza.   Mood depressed and more anxious than last visit.  Constantly obsessive thinking about things that could go wrong.  More short tempered.  Impaitent at home only. H got bad report from onc that current chemo not workingand it is  changed with limited success rate.  This affects her mood too.   No change in sleep. Plan: Plan: First 2 weeks reduce Ingrezza to 60 mg at night to see if shuffling is better with samples. If shuffling is better call office for change in RX If so then increase clozapine back to 3 of the 25 mg capsules and fluvoxamine back to 50 mg .  12/19/22 TC with nurse: Lambert Keto, LPN   TS   1/61/09 11:16 AM Note Pt was in to see her therapist, Rockne Menghini today and asked to speak with nurse. She reports having apt with Dr. Jennelle Human last week on April 8th, and he reduced her Ingrezza to 60 mg, she feels that the issue isn't coming from that but the Fluvoxamine that she was put on a few months back. She reports she may not have explained herself well enough at the apt. She reports when she wakes up during the night she has to shuffle and hold on to the wall to keep from falling. She reports her husband has cancer and she needs to be more alert and able to function in case of emergency with him.  She reports nothing changed with the decrease in that medication.    Informed her I would discuss with Dr. Jennelle Human and get back with her.     12/19/22 MD resp:  Rip Harbour.  Reduce fluvoxamine from 1 and 1/2 tablets at night to 1/2 tablet twice daily.  Split dose to reduce SE      01/09/23 appt noted: Meds: clozapine 50 mg HS, reduced fluvoxamine 12.5 mg Am, forcalin 15 BID, Ingrezza 40 BID, lamotrigine 100 BID,  Not doing well.  Dep and high anxiety.  Some irritability.  Mood swings not dramatic.  No SI. Balance issues when up in middle of night.  Does better once takes morning meds with focalin.   Sometimes forgets afternoon Focalin. Mouth movements still there but better.  Drooling stopped. A lot of things seem like too much working.  Still some wiggling toes and may chew side of mouth , not severe. Plan: Plan: DC fluvoxamine Trial Viibryd 5 mg daily for 1 week then 10 mg daily.  02/06/23 appt noted: Meds:   viibryd 10 HS, clozapine 50 mg HS, off  fluvoxamine,  focalin 15 BID, Ingrezza 40 BID, lamotrigine 100 BID,  Walking better at night wihout fluvoxamine and less dep but mood swings and anxiety through the roof.   Seems higher than last time.  Worry about everything.   Reduced appetite.  Some am nausea.   Upset with H's drinking.  He has terminal CA, prostate stage 4 mets to bones.  Hared living with someone terminal and also alcoholic.  Living with a lot of stress.   Less falling.   Mouth movements seem worse.  No drooling. Plan: increase Clozapine 62.5 mg HS for a week for mood swings and anxity and if fails but no SE then increase to 75 mg HS. Split ingrezza but increase bc TD worse lately to 60 mg BID  03/14/23 appt Noted: Increased clozapine to 75  mg HS. No SE except sedating at night.   Anxiety is still high but dep seems better.  Worry over H's CA and hosp last weekend.  Lots of medical bills.  General worry mostly about H's CA and alcoholism. Couple mood swings and racing obsessive thinking briefly. Sleeping well.  No SI Loss of appetite.   Plan: Increase to  Viibryd 15 mg daily for anxiety.   04/10/23 appt noted: Psych meds:  viibryd incr to 20 HS 2 weeks, clozapine 75 mg HS,  focalin usually 20 AM, Ingrezza 60 BID, lamotrigine 100 BID,  Noticed improvement in dep with incr Viibryd 20 mg without much change in anxiety.  Stress level is still high also.  H health getting worse and still drinking and fears having to call EMT bc of this. Taking Viibryd with food. Still some dizziness but no falls. Sleep is good.  No major mood swings. Still in counseling  and working on anxiety and low confidence. Worries over having to take care of finances.   Some racing thoughts in brief spells. Wants better cotrol of anxiety with constant worry and still irritable. Plan: For anxiety and irritability, reactivity incr clozapine to 100 mg HS  05/09/23 appt noted: Psych med: Clozapine 100 nightly, Focalin  15 twice daily, lamotrigine 100 twice daily, Ingrezza 60 mg twice daily, Viibryd 20 mg daily for 6 weeks. SE still shuffles at night bc afraid she will fall.  No worse.  No shuffling daytime. On occ taking Focalin 20 mg BID to help mood as primary benefit. Racing thoughts not often but not gone. Still a lot of worry.  Still has irritability. Some dep also but not as bad. Gets up one or two times nightly.   Plan: Increase Viibryd 30-then 40 mg daily for anxiety and depression.  06/08/23 appt noted: Psych meds as above;  Viibryd 40 mg. No SE with it as far as she knows She feels like mood has been more stable.  Still episodes of plunging but less often and briefer.  Still some bipolar moments doing things impulsively that later she regrets.   Anxiety still high but not as bad. Still shuffles at night only bc fear of losing balance but not a problem during the day.   Racing thoughts are better.   Sleep 8-10 and not drowsy.   Still some mouth puckering but better with Ingrezza.  Wonders if it will be covered after [facility]  07/10/23 apppt noted. Psych meds: Clozapine 100 @ 8 pm, Focalin 15 twice daily forgets 2nd dose, lamotrigine 100 twice daily, Ingrezza 60 twice daily, Viibryd 40 Not well.  10 day ago manic rage, yelling , cursing , slamming doors.  Lasted about an hour.  Totally out of control.  Last night H said something triggering anger and she was aggressive in speech.  Still seem to be angry and irritable.  Also racing thoughts random but chronic anxiety over finances.   With new [employer] advantage plan.  Looking into coverage for Ingrezza.  Concerned she might not be able to afford it.   Shuffles feet and balance problems when gets up and down.  Gets better when takes focalin I the morning.   Also having some depression    To bed 8 pm.   Plan: For anxiety and irritability, reactivity incr clozapine to 100 mg HS and 25 mg AM    08/07/23 appt noted: No further manic anger spells.  Some obsessive  thinking and dep.Obs thoughts can be about any  worries; like H's health.  H alcoholic and met prostate cancer and other problems.  $ concerns and dog sick.   Palliative care nurse found out his was drinking and she had RX Oxycontin and then she stopped RX.  He prays for death DT pain.  Thinks she would fall to pieces if he died.  H on chemo.   No falls but shuffles at night.   No napping.   Changing insurance first of year and will need PA of Ingrezza.  Don't think they will cover Focalin.   Psych meds:  clozapine 25 AM and 100 PM, Focalin 15 mg BID, lamotrigine 100 BID, Ingrezza 60 BID, Viibryd 40. ER visit with palp but none further and EKG unremarkable.   Hard time going to sleep.   So much on my mind.   To bed 8-830pm.   Sleep 10-11 hours usually.   Past Psychiatric Medication Trials: Vraylar 4.5 SE mouth movements reduced to 3 mg 3/20. It was effective at lower doses for depression.  Worse off it.  Vraylar 1.5 mg every third day led to relapse of significant depression. Caplyta SE at 42 mg .  Cost problems Latuda 80, , olanzapine, Seroquel, risperidone, Abilify, loxapine 25 mg BID (max tolerated) NR, Clozapine 100  Ingrezza 60 BID  helps No [place]edo  lithium 150 tremor   Trileptal 450, Depakote, Equetro 300 hx balance issues, CBZ ER falling,   Lamictal 200 twice daily,  Focalin,  Ritalin,   fluoxetine 60,  sertraline 100, Wellbutrin history of facial tics, paroxetine cognitive side effects, Lexapro 10 worse Fluvoxamine 50  buspirone,   ropinirole, amantadine, Sinemet, Artane, Cogentin,  pramipexole 0.5 mg BID helped tremor but caused anger trazodone hangover, Ambien hangover,  Review of Systems:  Review of Systems  HENT:  Positive for dental problem and tinnitus.        Chirping cricket sounds in hears since January 2023 drooling  Respiratory:  Negative for cough.   Cardiovascular:  Negative for chest pain.  Gastrointestinal:  Negative for abdominal pain and nausea.        GERD awakening her  Musculoskeletal:  Positive for arthralgias and gait problem.  Neurological:  Positive for dizziness and tremors. Negative for weakness.       Ankle problems and balance problems. No falls lately. Occ stumbles. Tremor is better in hands Mouth movements, mild lip licking.    Psychiatric/Behavioral:  Positive for dysphoric mood. Negative for agitation, behavioral problems, confusion, decreased concentration, hallucinations, self-injury, sleep disturbance and suicidal ideas. The patient is nervous/anxious. The patient is not hyperactive.   No falls since here. Not currently depressed but unable to remove this from the list.  Medications: I have reviewed the patient's current medications.  Current Outpatient Medications  Medication Sig Dispense Refill   acetaminophen (TYLENOL) 650 MG CR tablet Take 1,300 mg by mouth as needed for pain.     atorvastatin (LIPITOR) 20 MG tablet Take 1 tablet (20 mg total) by mouth every evening. 90 tablet 3   cloZAPine (CLOZARIL) 100 MG tablet Take 1 tablet (100 mg total) by mouth at bedtime. 30 tablet 1   Ferrous Gluconate (IRON 27 PO) Take by mouth.     INGREZZA 60 MG capsule TAKE 1 CAPSULE BY MOUTH TWICE A DAY 60 capsule 1   ketoconazole (NIZORAL) 2 % cream Apply 1 Application topically daily. 60 g 0   lamoTRIgine (LAMICTAL) 100 MG tablet Take 1 tablet (100 mg total) by mouth 2 (two) times daily. 180 tablet  0   Melatonin 10 MG CAPS Take by mouth at bedtime as needed.     pantoprazole (PROTONIX) 40 MG tablet TAKE (1) TABLET BY MOUTH TWICE DAILY. 180 tablet 1   promethazine-dextromethorphan (PROMETHAZINE-DM) 6.25-15 MG/5ML syrup Take 5 mLs by mouth 4 (four) times daily as needed. 100 mL 0   Vilazodone HCl (VIIBRYD) 40 MG TABS Take 1 tablet (40 mg total) by mouth daily. 90 tablet 0   cloZAPine (CLOZARIL) 25 MG tablet Take 1 tablet (25 mg total) by mouth 2 (two) times daily. 60 tablet 1   FOCALIN 10 MG tablet Take 1.5 tablets (15 mg total) by  mouth 2 (two) times daily. 90 tablet 0   No current facility-administered medications for this visit.    Medication Side Effects: Other: tremor and weight gain.   Dyskinesia appears better  SE bettter than they were.  Balance problems intermittently  Allergies:  Allergies  Allergen Reactions   Azithromycin Anaphylaxis   Penicillins Anaphylaxis    DID THE REACTION INVOLVE: Swelling of the face/tongue/throat, SOB, or low BP? Yes Sudden or severe rash/hives, skin peeling, or the inside of the mouth or nose? Yes Did it require medical treatment? No When did it last happen?       If all above answers are "NO", may proceed with cephalosporin use.  Patient reacts to Z pack.  HAS Taken amoxicillin fine.   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    Esophageal stricture    Fracture of superior pubic ramus (HCC) 11/28/2018   GERD (gastroesophageal reflux disease)    Headache(784.0)    Hyperlipidemia    Interstitial cystitis    Joint pain    Lactose intolerance    Lung cancer (HCC) 2002   Neuromuscular disorder (HCC)    Obesity    Osteoarthritis    Palpitations    Sleep apnea    Doesn't use a CPAP   Suicidal ideation 01/20/2020   Swallowing difficulty    Tardive dyskinesia     Family History  Problem Relation Age of Onset   Arthritis Mother    Hearing loss Mother    Hyperlipidemia Mother    Hypertension Mother    Depression Mother    Anxiety disorder Mother    Obesity Mother    Sudden death Mother    Hypertension Father    Diabetes Mellitus II Father    Heart  disease Father    Arthritis Father    Cancer Father        Brain   COPD Father    Diabetes Father    Hyperlipidemia Father    Sleep apnea Father    Early death Sister        Aneroxia/Bulimic   Depression Brother    Early death Hydrographic surveyor Accident   Stroke Maternal Grandmother    Hypertension Maternal Grandmother    Arthritis  Maternal Grandfather    Heart attack Maternal Grandfather    Hearing loss Maternal Grandfather    Depression Daughter    Drug abuse Daughter    Heart disease Daughter    Hypertension Daughter    Colon cancer Neg Hx    Esophageal cancer Neg Hx    Rectal cancer Neg Hx     Social History   Socioeconomic History   Marital status: Married    Spouse name: Not on file   Number of children: 1   Years of education: 12   Highest education level: Not on file  Occupational History   Occupation: retired  Tobacco Use   Smoking status: Never   Smokeless tobacco: Never  Vaping Use   Vaping status: Never Used  Substance and Sexual Activity   Alcohol use: Not Currently    Alcohol/week: 1.0 standard drink of alcohol    Types: 1 Glasses of wine per week    Comment: 1 glass wine q few weeks   Drug use: No   Sexual activity: Yes  Other Topics Concern   Not on file  Social History Narrative   Pt lives in [place] with husband [name].  Followed by Dr. Jennelle Human for psychiatry and Rockne Menghini for therapy.   Right handed   Drinks caffeine   One story home   Married lives with husband   retired   International aid/development worker of Corporate investment banker Strain: Low Risk  (06/01/2023)   Overall Financial Resource Strain (CARDIA)    Difficulty of Paying Living Expenses: Not hard at all  Food Insecurity: No Food Insecurity (06/22/2023)   Hunger Vital Sign    Worried About Running Out of Food in the Last Year: Never true    Ran Out of Food in the Last Year: Never true  Transportation Needs: No Transportation Needs (06/22/2023)   PRAPARE - Therapist, art (Medical): No    Lack of Transportation (Non-Medical): No  Physical Activity: Inactive (06/01/2023)   Exercise Vital Sign    Days of Exercise per Week: 0 days    Minutes of Exercise per Session: 0 min  Stress: Stress Concern Present (06/01/2023)   [employer] of Occupational Health - Occupational Stress Questionnaire    Feeling of Stress : Very much  Social Connections: Socially Integrated (06/01/2023)   Social Connection and Isolation Panel [NHANES]    Frequency of Communication with Friends and Family: More than three times a week    Frequency of Social Gatherings with Friends and Family: More than three times a week    Attends Religious Services: More than 4 times per year    Active Member of [employer] or Organizations: Yes    Attends Banker Meetings: More than 4 times per year    Marital Status: Married  Catering manager Violence: Not At Risk (06/01/2023)   Humiliation, Afraid, Rape, and Kick questionnaire    Fear of Current or Ex-Partner: No    Emotionally Abused: No    Physically Abused: No    Sexually Abused: No    Past Medical History, Surgical history, Social history, and Family history were reviewed and updated as appropriate.   Please see review of systems for further details on the patient's review from today.   Objective:   Physical Exam:  There were no vitals taken for this visit.  Physical Exam Neurological:     Mental Status: She is alert and oriented to person, place, and time.  Cranial Nerves: No dysarthria.     Gait: Gait normal.     Comments: Lip licking consistent, no worse Good gait  Psychiatric:        Attention and Perception: Attention and perception normal.        Mood and Affect: Mood is anxious and depressed.        Speech: Speech normal.        Behavior: Behavior is cooperative.        Thought Content: Thought content normal. Thought content is not paranoid or delusional. Thought content does not include  homicidal or suicidal ideation. Thought content does not include suicidal plan.        Cognition and Memory: Cognition and memory normal.        Judgment: Judgment normal.     Comments: Insight intact Ongoing residual dep, irritability, anxiety with anxiety worst     Lab Review:     Component Value Date/Time   NA 139 08/02/2023 1214   NA 142 03/22/2023 0916   K 4.2 08/02/2023 1214   CL 106 08/02/2023 1214   CO2 26 08/02/2023 1214   GLUCOSE 101 (H) 08/02/2023 1214   BUN 17 08/02/2023 1214   BUN 12 03/22/2023 0916   CREATININE 0.85 08/02/2023 1214   CALCIUM 9.8 08/02/2023 1214   PROT 7.2 03/22/2023 0916   ALBUMIN 4.5 03/22/2023 0916   AST 27 03/22/2023 0916   ALT 14 03/22/2023 0916   ALKPHOS 68 03/22/2023 0916   BILITOT 0.7 03/22/2023 0916   GFRNONAA >60 08/02/2023 1214   GFRAA 96 03/02/2020 1433       Component Value Date/Time   WBC 8.0 08/02/2023 1214   RBC 4.29 08/02/2023 1214   HGB 13.2 08/02/2023 1214   HGB 14.8 03/22/2023 0916   HCT 39.8 08/02/2023 1214   HCT 47.3 (H) 03/22/2023 0916   PLT 188 08/02/2023 1214   PLT 225 03/22/2023 0916   MCV 92.8 08/02/2023 1214   MCV 92 03/22/2023 0916   MCH 30.8 08/02/2023 1214   MCHC 33.2 08/02/2023 1214   RDW 14.8 08/02/2023 1214   RDW 13.8 03/22/2023 0916   LYMPHSABS 3.4 (H) 03/22/2023 0916   MONOABS 0.7 04/04/2019 1004   EOSABS 0.2 03/22/2023 0916   BASOSABS 0.0 03/22/2023 0916  Vitamin D level 33 on 10K units daily on 12/4/`9 Increased to prescription vitamin d 50K units Monday, Wed, Friday.  Rx sent in.   Lithium Lvl  Date Value Ref Range Status  10/21/2018 0.18 (L) 0.60 - 1.20 mmol/L Final    Comment:    Performed at [facility], [address], [place], [place] [address]     No results found for: "PHENYTOIN", "PHENOBARB", "VALPROATE", "CBMZ"   .res Assessment: Plan:    Bipolar disorder with moderate depression (HCC) - Plan: cloZAPine (CLOZARIL) 25 MG tablet  Generalized anxiety disorder  Attention  deficit hyperactivity disorder (ADHD), predominantly inattentive type  Insomnia due to mental condition  Tardive dyskinesia  Mild cognitive impairment  Long term current use of clozapine  Low vitamin D level  Tremor of both hands  Bipolar affective disorder, depressed, severe (HCC) - Plan: FOCALIN 10 MG tablet  30 min face to face time with patient was spent on counseling and coordination of care. We discussed multiple dxes and concerns.   Megan Whitney has chronic rapid cycling bipolar disorder which is chronically unstable and has been difficult to control.  Failed 14 different mood stabilizers.  The rapid cycling is making it difficult to  control frequency of depressive episodes and the anxiety as well.  We have typically had to make frequent med changes.   Disc gradual increase bc med sensitivity.  Med sensitivity to SE seems to be the biggest problem preventing better mood control  Disc options for better control of dep, irritability and anxiety.  Incr Viibryd vs clozapine  Ingrezza for TD helped stop tongue chewing but still some mouth movements at 80 mg, which was started 05/23/22.  Shuffling only at night now.  Drooling resolved  Split ingrezza but increase bc TD worse lately to 60 mg BID Consider Austedo.  Disc cost concerns  .  Getting grant but that might end.  Need to pick insurance that will cover it.  Disc Coverage app.  Disc ECT. Only FDA approved option left. She wants to defer.    Extensive discussion of clozapine dosing recommendations but we will increase more slowly bc she is so med sensitive.Disc risk low WBC, cardiomyopathy, etc, sedation  (Started clozapine on 12/07/21).    Check CBC with diff every 4 weeks.  Now.  Option increase clozapine for anxiety.  Disc SR risk.   She would like to try further increase.  For anxiety and irritability, reactivity incr clozapine to 100 mg HS and 25 mg AM   Likely to work better than incr Viibryd but consider latter.  She has a high  residual anxiety.  It has been impossible to control all of her symptoms simultaneously without causing side effects. Failed various meds.  Discussed side effects of each medicine. Continue   Focalin IR 15 mg BID DT Cost and off label for depression  Try to spread this out if possible for mood.  She feels this is necessary  .  Can't go higher but she often forgets the second dose. Discussed potential benefits, risks, and side effects of stimulants with patient to include increased heart rate, palpitations, insomnia, increased anxiety, increased irritability, or decreased appetite.  Instructed patient to contact office if experiencing any significant tolerability issues.  Would like to avoid BZ if possible.  Lamotrigine 100 mg twice daily to try to reduce polypharmacy and so improve tolerability of clozapine.  consider reduction if clozapine helps.  Failed all reasonable alternatives for anxiety.  Option viibryd, Auvelity  Discussed potential metabolic side effects associated with atypical antipsychotics, as well as potential risk for movement side effects. Advised pt to contact office if movement side effects occur.    Checked B12 folate bc memory complaints.  Normal B12 & folate on 05/25/22  Disc SE meds and this is heightened by the complication of necessary polypharmacy.  Supportive  and cognitive behavioral therapy in terms of dealing with husband's addiction and now dx metastatic prostate CA..  breaking down tasks into manageable parts.  Grief.   Rec exercise as it would help.  Take advantage of family support  Continue Viibryd 40 mg daily for anxiety and depression.  It helped some Take with food.   and usually with dinner.  Requires frequent FU DT chronic instability.  Wants to schedule monthly.  FU 4 weeks.   Meredith Staggers MD, DFAPA  Please see After Visit Summary for patient specific instructions.    Future Appointments  Date Time Provider Department Center  08/08/2023  10:00 AM Mathis Fare, LCSW CP-CP None  08/10/2023  8:45 AM Bella Kennedy, PT [facility] None  08/15/2023  2:30 PM Marisue Brooklyn, PT [facility] None  08/16/2023  8:40 AM Del Nigel Berthold, FNP R[facility]-RPC  RPC  08/17/2023 10:30 AM Clemons, Mick Sell B [facility] None  08/18/2023  8:45 AM Marisue Brooklyn, PT [facility] None  08/21/2023  9:00 AM Mathis Fare, LCSW CP-CP None  08/21/2023  2:30 PM Marisue Brooklyn, PT [facility] None  08/23/2023  8:45 AM Marisue Brooklyn, PT [facility] None  09/04/2023 10:00 AM Mathis Fare, LCSW CP-CP None  09/11/2023 10:30 AM Cottle, Steva Ready., MD CP-CP None  09/18/2023 10:00 AM Mathis Fare, LCSW CP[facility] None  09/27/2023  1:00 PM Anabel Halon, MD RPC-RPC RPC  10/02/2023 10:00 AM Mathis Fare, LCSW [facility] None  10/02/2023  3:40 PM Marykay Lex, MD [facility] None  10/10/2023 10:00 AM Cottle, Steva Ready., MD CP-CP None  06/05/2024 10:40 AM RPC-ANNUAL WELLNESS VISIT RPC-RPC RPC    No orders of the defined types were placed in this encounter.      -------------------------------

## 2023-08-08 ENCOUNTER — Telehealth: Payer: Self-pay | Admitting: Psychiatry

## 2023-08-08 ENCOUNTER — Ambulatory Visit (INDEPENDENT_AMBULATORY_CARE_PROVIDER_SITE_OTHER): Payer: No Typology Code available for payment source | Admitting: Psychiatry

## 2023-08-08 DIAGNOSIS — F3132 Bipolar disorder, current episode depressed, moderate: Secondary | ICD-10-CM | POA: Diagnosis not present

## 2023-08-08 MED ORDER — CLOZAPINE 25 MG PO TABS
25.0000 mg | ORAL_TABLET | Freq: Two times a day (BID) | ORAL | 1 refills | Status: DC
Start: 2023-08-08 — End: 2023-09-25

## 2023-08-08 NOTE — Telephone Encounter (Signed)
Next visit is 08/08/23. Megan Whitney is being seen by Dr. Jennelle Human this morning and needs a refill on her Clozapine 25 mg called to:    [facility] - [place], [place] - [address]    [phone]

## 2023-08-08 NOTE — Progress Notes (Signed)
[facility] Counselor/Therapist Progress Note  Patient ID: Megan Whitney, MRN: [id number],    Date: 08/08/2023  Time Spent: 55 minutes   Treatment Type: Individual Therapy  Reported Symptoms: "bipolar", fear, anxiety, obsessiveness, ruminating, "I feel like my depression is some better"   Mental Status Exam:  Appearance:   Casual and Neat     Behavior:  Appropriate, Sharing, and Motivated  Motor:  Normal  Speech/Language:   Clear and Coherent  Affect:  Anxiety, "bipolar"  Mood:  anxious and some depression but "it is better"  Thought process:  goal directed  Thought content:    Rumination  Sensory/Perceptual disturbances:    WNL  Orientation:  oriented to person, place, time/date, situation, day of week, month of year, year, and stated date of Dec.  3, 2024  Attention:  Fair  Concentration:  Good  Memory:  WNL  Fund of knowledge:   Good  Insight:    Good and Fair  Judgment:   Good  Impulse Control:  Good and Fair   Risk Assessment: Danger to Self:  No Self-injurious Behavior: No Danger to Others: No Duty to Warn:no Physical Aggression / Violence:No  Access to Firearms a concern: No  Gang Involvement:No   Subjective:   Patient and for session today and reporting her symptoms to be "bipolar", anxiety, fear, some obsessing, depression, and ruminating some regarding her husband's illness and sometimes regarding extended family situations. Working today in session further on her anxiety management, fears, relationship with alcoholic husband who also has prostate cancer stage 4. In contact with his nurse and they are to meet again on Dec. 17th. 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 Did some difficult but very needed work today on her grief (anticipatory and current) and working through some of her fears and being able to see some of her positives. Showing more strength and encouraged more self-care and healthy  decision-making. Less "what-if's" and some more belief in herself "that comes and goes".  Review of goals and patient  states at times she is showing more "resilience in managing bad things."  Interventions: Cognitive Behavioral Therapy, Solution-Oriented/Positive Psychology, and Ego-Supportive  Long Term Goal: Reduce overall level, frequency, and intensity of the anxiety so that daily functioning is not impaired. Short Term Goal: 1.Increase understanding of the beliefs and messages that produce the worry and anxiety. Strategies: 1.Help client develop reality-based positive cognitive messages/self-talk. 2. Develop a "coping card" or other reminder which coping strategies are recorded for patient's later use.  Diagnosis:   ICD-10-CM   1. Bipolar disorder with moderate depression (HCC)  F31.32      Plan:  Patient and for her appointment today and continued her work with goal-directed behaviors to help alleviate symptoms of depression, obsessiveness, "bipolar", fears, anxiety, and some rumination.  Continues to have some mixed feelings of concern and anger based on different situations with her husband.  She shows progress although with some ups and downs, but does feel good about the progress she has made.  Patient needs to continue to work with goal-directed behaviors in order to move in a more healthy and hopeful direction.  Encouraged patient in her practice is much as possible more positive and self affirming behaviors as discussed in sessions including: Remain focused on making good decisions versus impulsive decisions, letting friends be supportive of her as this has proven to be helpful to her over time, refrain from self sabotaging her goals  or relationships with others, stay in contact with supportive people and friends, use of journaling between sessions as she feels would be helpful, set limits with people who are not supportive, refrain from assuming worst-case scenarios, look for the  strengths and the positives within herself, spend time with her 2 dogs which is very therapeutic for her, use of positive self talk, and recognize the strength she shows when working with goal-directed behaviors to move in a direction that supports her improved emotional and physical health and her outlook into the future.  Goal review and progress/challenges noted with patient.  Next appointment within 2 to 3 weeks.   Mathis Fare, LCSW

## 2023-08-08 NOTE — Telephone Encounter (Signed)
Patient had appt yesterday. Rx was sent, but it was sent to the wrong pharmacy. Will resend to [facility].

## 2023-08-10 ENCOUNTER — Encounter (HOSPITAL_COMMUNITY): Payer: No Typology Code available for payment source | Admitting: Physical Therapy

## 2023-08-15 ENCOUNTER — Encounter (HOSPITAL_COMMUNITY): Payer: No Typology Code available for payment source

## 2023-08-15 NOTE — Progress Notes (Unsigned)
   Established Patient Office Visit   Subjective  Patient ID: DAZIA HERMOSO, female    DOB: October 18, 1955  Age: 67 y.o. MRN: [id number]  No chief complaint on file.   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  HPI  ROS    Objective:     There were no vitals taken for this visit. {Vitals History (Optional):23777}  Physical Exam   No results found for any visits on 08/16/23.  The 10-year ASCVD risk score (Arnett DK, et al., 2019) is: 5.1%    Assessment & Plan:  There are no diagnoses linked to this encounter.  No follow-ups on file.   Cruzita Lederer Newman Nip, FNP

## 2023-08-15 NOTE — Patient Instructions (Signed)

## 2023-08-16 ENCOUNTER — Telehealth: Payer: Self-pay | Admitting: *Deleted

## 2023-08-16 ENCOUNTER — Ambulatory Visit: Payer: No Typology Code available for payment source | Admitting: Family Medicine

## 2023-08-16 NOTE — Progress Notes (Signed)
  Care Coordination Note  08/16/2023 Name: KANASHA DESSOURCES MRN: [id number] DOB: 06-Jun-1956  Megan Whitney is a 66 y.o. year old female who is a primary care patient of Anabel Halon, MD and is actively engaged with the care management team.  CERINITY GADOMSKI reached out to care guide by phone today to assist with  canceling  a follow up visit with the BSW  Follow up plan: Patient declines further follow up and engagement by the care management team. Appropriate care team members and provider have been notified via electronic communication.   [facility]  Care Coordination Care Guide  Direct Dial: [phone]

## 2023-08-17 ENCOUNTER — Ambulatory Visit: Payer: Self-pay

## 2023-08-17 NOTE — Patient Outreach (Signed)
  Care Coordination   08/17/2023 Name: Megan Whitney MRN: [id number] DOB: 1956-03-13   Care Coordination Outreach Attempts:  An unsuccessful telephone outreach was attempted for a scheduled appointment today.  Follow Up Plan:  Additional outreach attempts will be made to offer the patient care coordination information and services.   Encounter Outcome:  No Answer   Care Coordination Interventions:  No, not indicated    Lysle Morales, BSW Social Worker [phone]

## 2023-08-18 ENCOUNTER — Encounter (HOSPITAL_COMMUNITY): Payer: No Typology Code available for payment source

## 2023-08-21 ENCOUNTER — Encounter (HOSPITAL_COMMUNITY): Payer: No Typology Code available for payment source

## 2023-08-21 ENCOUNTER — Ambulatory Visit (INDEPENDENT_AMBULATORY_CARE_PROVIDER_SITE_OTHER): Payer: No Typology Code available for payment source | Admitting: Psychiatry

## 2023-08-21 DIAGNOSIS — F3132 Bipolar disorder, current episode depressed, moderate: Secondary | ICD-10-CM

## 2023-08-21 NOTE — Progress Notes (Signed)
[facility] Counselor/Therapist Progress Note  Patient ID: Megan Whitney, MRN: [id number],    Date: 08/21/2023  Time Spent: 55 minutes   Treatment Type: Individual Therapy  Reported Symptoms: anxiety, "exploding anger", some fear, "bipolar", obsessiveness, depression improved   Mental Status Exam:  Appearance:   Casual     Behavior:  Appropriate, Sharing, and Motivated  Motor:  Normal  Speech/Language:   Clear and Coherent  Affect:  Depressed and anxious  Mood:  anxious and depressed  Thought process:  goal directed  Thought content:    Some obsessive thoughts  Sensory/Perceptual disturbances:    WNL  Orientation:  oriented to person, place, time/date, situation, day of week, month of year, year, and stated date of Dec. 16, 2024  Attention:  Good  Concentration:  Good  Memory:  WNL  Fund of knowledge:   Good  Insight:    Good and Fair  Judgment:   Good and Fair  Impulse Control:  Fair and Poor   Risk Assessment: Danger to Self:  No Self-injurious Behavior: No Danger to Others: No Duty to Warn:no Physical Aggression / Violence:No  Access to Firearms a concern: No  Gang Involvement:No   Subjective:  Patient today in session and reports anixety, "exploding anger"  (a one-time incident a week ago but better now), some fear, "bipolar", obsessiveness, depression improved. Explains her exploding anger episode recently in front of husband had other relatives present as well. States she thought it was a build up of stress and maybe "part of a panic attack" and states "it  hasn't happened any more. Worries about her husband with cancer. Patient continues work on her anger, fears, anxiety management, self esteem and not judging herself, and talked through her contact with Hospice recently as patient's husband may start receiving care from them. Continues her work on her fears and recognizing some growth and positives. She does feel more strength at times although also tired  and trying to get enough sleep. Less "what-ifs".  Continues with working through her fears and anxieties and also able to experience some positives as she continues to show more strength even in difficult times.  Encouraged her healthier decision making, setting of limits, and improve self-care.  Does have some bad moments but seems to still be showing more resilience as she manages her feelings about husband's illness and her anxieties about the future.  Interventions: Cognitive Behavioral Therapy and Ego-Supportive  Long Term Goal: Reduce overall level, frequency, and intensity of the anxiety so that daily functioning is not impaired. Short Term Goal: 1.Increase understanding of the beliefs and messages that produce the worry and anxiety. Strategies: 1.Help client develop reality-based positive cognitive messages/self-talk. 2. Develop a "coping card" or other reminder which coping strategies are recorded for patient's later use.   Diagnosis:   ICD-10-CM   1. Bipolar disorder with moderate depression (HCC)  F31.32      Plan:  Patient in for appointment today continuing to use goal-directed behaviors in trying to help alleviate her symptoms of obsessiveness, "bipolar", depression, fears, anxiety, and some rumination.  The relationship with her husband continues to be 1 of supporting him with his cancer but also conflictual as he does not always follow the advice of his doctor and patient feels this is self sabotaging, despite the fact she is trying to be supportive of him.  She often wrestles with her mixed feelings of anger and concern and spoke about this more today.  (Not all details  included in this note due to patient privacy needs).  She has shown progress and needs to continue her work with goal-directed behaviors to move more in a healthier and more hopeful direction.  Encouraged patient in re-focusing more on her positive and self affirming behaviors as discussed in sessions including:  Making good and thought-through decisions versus impulsive decisions, letting friends be supportive of her, refrain from self sabotaging her goals or relationships with others, use of journaling between sessions which has been helpful previously, set limits with people who are not supportive, refrain from assuming worst-case scenarios, look for the strengths and the positives within herself, spend time with her 2 dogs which is very therapeutic for her, positive self talk, and realize the strength she shows when working with goal-directed behaviors to move in a direction that supports her improved emotional and physical health and outlook into the future.  Goal review and progress/challenges noted with patient.  Next appointment within 2 to 3 weeks.   Mathis Fare, LCSW

## 2023-08-23 ENCOUNTER — Encounter (HOSPITAL_COMMUNITY): Payer: No Typology Code available for payment source

## 2023-08-31 DIAGNOSIS — Z79899 Other long term (current) drug therapy: Secondary | ICD-10-CM | POA: Diagnosis not present

## 2023-08-31 DIAGNOSIS — F319 Bipolar disorder, unspecified: Secondary | ICD-10-CM | POA: Diagnosis not present

## 2023-09-04 ENCOUNTER — Ambulatory Visit: Payer: No Typology Code available for payment source | Admitting: Psychiatry

## 2023-09-04 ENCOUNTER — Telehealth: Payer: Self-pay

## 2023-09-04 DIAGNOSIS — F3132 Bipolar disorder, current episode depressed, moderate: Secondary | ICD-10-CM | POA: Diagnosis not present

## 2023-09-04 NOTE — Progress Notes (Signed)
[facility] Counselor/Therapist Progress Note  Patient ID: Megan Whitney, MRN: [id number],    Date: 09/04/2023  Time Spent: 53 minutes   Treatment Type: Individual Therapy  Reported Symptoms: "bipolar", anxiety, depression, some fear, some obsessiveness, anger much improved   Mental Status Exam:  Appearance:   Casual and Neat     Behavior:  Appropriate, Sharing, and Motivated  Motor:  Normal  Speech/Language:   Clear and Coherent  Affect:  Depressed and anxiety  Mood:  anxious and depressed  Thought process:  goal directed  Thought content:    Rumination  Sensory/Perceptual disturbances:    WNL  Orientation:  oriented to person, place, time/date, situation, day of week, month of year, year, and stated date of Dec. 30, 2024  Attention:  Fair  Concentration:  Fair  Memory:  WNL; later added that she feels her memory is impacted by one of her meds and plans to speak with Dr about this at appt next wk.  Fund of knowledge:   Good  Insight:    Good  Judgment:   Good  Impulse Control:  Fair   Risk Assessment: Danger to Self:  No Self-injurious Behavior: No Danger to Others: No Duty to Warn:no Physical Aggression / Violence:No  Access to Firearms a concern: No  Gang Involvement:No   Subjective:  Patient in today and reports some improvement in her anger and obsessiveness and fears. Enjoyed pleasant Christmas holidays with extended family. Husband still battling cancer but was involved with family during holidays which felt good to patient. No panic attacks since last appt. Mood seems some better today. Reports working on her anxiety, depression, fears, self esteem, anger, and trying to not judge herself. Feels she is managing fears some better. Sleep , "once I get to sleep", is ok. States she does need to get back on schedule of going to bed a little earlier. States she is still confronting more of her fears as processed in sessions and able to "talk myself down at times."  Getting better at "managing my fears".  More proactive at times. No SI and fewer crying spells reported. Some decrease in her anxiety and recognizing that is a positive feeling for her. "Trying not to assume the negative and I'm getting better in following through on that." Continued work on making healthy decisions, improving her self-care, and setting healthy limits for herself. Trying to better manage her feelings about husband's illness and setting healthier limits.  Interventions: Cognitive Behavioral Therapy and Ego-Supportive  Long Term Goal: Reduce overall level, frequency, and intensity of the anxiety so that daily functioning is not impaired. Short Term Goal: 1.Increase understanding of the beliefs and messages that produce the worry and anxiety. Strategies: 1.Help client develop reality-based positive cognitive messages/self-talk. 2. Develop a "coping card" or other reminder which coping strategies are recorded for patient's later use.   Diagnosis:   ICD-10-CM   1. Bipolar disorder with moderate depression (HCC)  F31.32      Plan:   Patient showing continued motivation and her appointment today as she worked further on her goal-directed behaviors particularly her anxiety, obsessiveness, depression, fears, and "bipolar". Working more intentionally on her goals and noting some progress. Continues to be angry at times with husband, but also concerned as he does not always follow medical advice. Patient has shown progress in working on her goals and needs to continue goal-directed behaviors to move forward in a more healthy and hopeful direction.  Encouraged patient in her  continued efforts to re-focus more on her self affirming and positive self talk and behaviors as noted in sessions including: Using good judgment and making thought-through decisions versus impulsive decisions, letting friends be supportive of her, refrain from self sabotaging regarding her goals or relationships with  others, use of journaling between sessions which has been helpful previously, set limits with people who are not supportive, refrain from assuming worst-case scenarios, look for the strengths and the positives within herself, engage frequently with her 2 dogs which is very therapeutic for her, and recognize the strength she shows working with goal-directed behaviors to move in a direction that supports her improved emotional and physical health, and her overall wellbeing.  Goal review and progress/challenges noted with patient.  Next appointment within 2 weeks.   Mathis Fare, LCSW

## 2023-09-04 NOTE — Telephone Encounter (Signed)
Pt called in regarding her labs, she states she got them done end of Dec.  She has the results on her phone. I let her know from my sreen the last results I see is from 11/27.

## 2023-09-05 ENCOUNTER — Encounter: Payer: Self-pay | Admitting: Psychiatry

## 2023-09-05 ENCOUNTER — Telehealth: Payer: Self-pay

## 2023-09-05 NOTE — Telephone Encounter (Signed)
Yes, thank you.

## 2023-09-05 NOTE — Telephone Encounter (Signed)
ANC 08/31/23 6.1. Needs put in [facility] so she can get medication. I have been unable to reach Traci this afternoon to have her do it.

## 2023-09-11 ENCOUNTER — Telehealth (INDEPENDENT_AMBULATORY_CARE_PROVIDER_SITE_OTHER): Payer: PPO | Admitting: Psychiatry

## 2023-09-11 ENCOUNTER — Encounter: Payer: Self-pay | Admitting: Psychiatry

## 2023-09-11 DIAGNOSIS — F411 Generalized anxiety disorder: Secondary | ICD-10-CM

## 2023-09-11 DIAGNOSIS — F900 Attention-deficit hyperactivity disorder, predominantly inattentive type: Secondary | ICD-10-CM | POA: Diagnosis not present

## 2023-09-11 DIAGNOSIS — G2401 Drug induced subacute dyskinesia: Secondary | ICD-10-CM

## 2023-09-11 DIAGNOSIS — G3184 Mild cognitive impairment, so stated: Secondary | ICD-10-CM | POA: Diagnosis not present

## 2023-09-11 DIAGNOSIS — F5105 Insomnia due to other mental disorder: Secondary | ICD-10-CM

## 2023-09-11 DIAGNOSIS — Z79899 Other long term (current) drug therapy: Secondary | ICD-10-CM | POA: Diagnosis not present

## 2023-09-11 DIAGNOSIS — F314 Bipolar disorder, current episode depressed, severe, without psychotic features: Secondary | ICD-10-CM

## 2023-09-11 DIAGNOSIS — R251 Tremor, unspecified: Secondary | ICD-10-CM

## 2023-09-11 DIAGNOSIS — R7989 Other specified abnormal findings of blood chemistry: Secondary | ICD-10-CM | POA: Diagnosis not present

## 2023-09-11 DIAGNOSIS — F3132 Bipolar disorder, current episode depressed, moderate: Secondary | ICD-10-CM

## 2023-09-11 MED ORDER — DEXMETHYLPHENIDATE HCL 10 MG PO TABS
20.0000 mg | ORAL_TABLET | Freq: Two times a day (BID) | ORAL | 0 refills | Status: DC
Start: 2023-09-11 — End: 2023-10-10

## 2023-09-11 NOTE — Progress Notes (Signed)
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 appointment she decided she did want to start clozapine .  Given her med sensitivity we started at the lowest dose 12.5 mg nightly.  She was therefore instructed to stop Vraylar . Taken clozapine  25 mg once last night. Experiencing more depression.  Anxiety out the roof.  Anger.  Sleep is good and better with clozapine .9-10 hours. Rough 3 weeks.  Mixed sx with depression the main one. SE drooling. Mouth movements, she doesn't want to add another med right now. Tremor a lot better off Vraylar , almost none.   Saw neuro and pending sleep study. Plan: Clonidine  off label for irritability and anxiety 0.05 mg BID Increase clonidine  to 1/2 tablet twice a day for a week.    12/16/2021 phone call from patient's husband concerned that she is grinding her teeth and slurring her words.  She had started clozapine  taking 25 mg tablets 1-1/2 nightly and she was instructed to reduce the dose to 25 mg nightly  01/04/2022 appointment with the following noted: Off Vraylar  and on clozapine  25 mg HS.  Continues Equetro  200, CBZ 100, Lamotrigine  200 BID, lithium  150 HS, clonidine  0.1 mg 1/2 in AM and 1 at night, Librium  10 HS. SE a alittle dizzy. SE: Still having mouth movements and biting tongue.  Sometimes hard  to talk.  Drooling.  When tries to increase clozapine  slurred speech and severe dizziness.   Mood is a little better.   Sleeping 8-9 hours.  So much better sleep with clozapine .   Still has anxiety, generalized. Plan: To minimize polypharmacy and improve tolerabilty:  Reduce Equetro  to 1 of the 100 mg capsule at night for 1 week, then stop it. Wait 1 week then stop the carbamazepine  chewable. Wait 1 more week then reduce clonidine  to 1/2 at night for 1 week then stop it. Plan: Started clozapine   and continue 25 mg for now bc hasn't tolerated more so far.  02/08/22 appt noted: Mouth movements and biting tongue.  Sores on cheek with constant chewing movements. Sometimes hard to talk. Hypersalivation gets worse as day progresses. Sometimes balance problems.  Constipation managed.   Sleep very well.  8-9 hours. Off Equetro  and on clozapine . Still has depression and anxiety without much change Plan: Started clozapine   and continue 25 mg for now bc hasn't tolerated more and need to start Ingrezza  40 mg for TD.  03/23/2022 appointment with the following noted: Several phone calls since here.  Has gotten up to clozapine  37.5 mg nightly. Continues Focalin  15  mg twice daily, lamotrigine  200 mg twice daily, lithium  150 nightly. Started Ingrezza  40 mg daily. Ingrezza  amazing difference but even with grant of $10000 can't afford it.  Not biting mouth and mouth less sore.  Less mouth movements but not gone Emotionally not real well with anxiety and depression and crying spells.  Also some irritability and anger.  Easily triggered anger. Sleep more broken with Ingrezza  HS but 8-9 hours. Can be sedated if gets up early with slurred speech but not if full night sleep. Balance better off Equetro . Plan: Started clozapine  but needs to increase bc minimal effect and better tolerance, so increase to 50 mg HS  04/05/22 appt noted: Increased clozapine  to 50 mg HS.  Some groggy in the AM.  One day was dizzy.   Otherwise on occasion.  Takes it right before bed.   Still depression, hopeless, irritability and anger.  Some periods of racing thoughts.  Sometimes recognizes hypomanic episodes and somethimes doesn't recognize. Dog is very sick and H with bone CA.  Not crying on clozapine  as much. GERD and needs surgery for hiatal hernia. Signed up for water aerobics. Plan: Started clozapine  but needs to increase bc minimal effect and better tolerance, so increase to 75 mg HS (Started clozapine  on 12/07/21) Reduce librium  5 mg HS and plan to stop  05/10/22 appt med: TD partially better with Ingrezza  40.  Has  a grant.   Increased clozapine  75 mg HS, reduced Librium  to 5 mg HS. Tolerated OK. Depression a little better.  Irritability still high.  Poor memory and easily confused. Sleep is pretty good and is better and needs to sleep longer.   Plan: DC librium  Worsening TD partial response on 40 mg Ingrezza , increase to 60 mg daily   Continue clozapine  100 mg HS  05/17/2022 phone call complaining of sleeping more and feeling sleepy and foggy thinking also dropping some things and drooling.  She was instructed to reduce the clozapine  to 75 mg nightly to see if that was the problem. 05/23/2022 phone call asking to increase Ingrezza  from 60 to 80 mg daily.  It was agreed. 06/16/2022 phone call stating she had a bad manic episode the week prior and is still feeling excessively sedated.  Also having hand tremors. Instructed to stop lithium  and continue clozapine  75 mg nightly.  She is very med sensitive but we have few options left to treat her unstable bipolar disorder. 06/21/2022 phone call: After complaining of excessive sleepiness and excessive sleeping she is now complaining of insomnia.  07/07/22 appt noted: Current psychiatric medications include clozapine  75 mg nightly, Focalin  15 mg twice daily, lamotrigine  100 mg twice daily Ingrezza  80 mg daily.  She stopped lithium  Has a list of concerns: SE drooling bad.    Still have mouth movements with the increase Ingrezza  80 mg daily but has stopped the tongue chewing. Goes to bed 9 PM and to sleep in 30 mins and awaken in the AM about 830 and hard to wake up.  Not napping in the day.  Getting enough sleep.   Notices Focalin  kicking in when takes it. Mood depressed but not as bad.  Still some irritability, anger.  3 week ago bad manic anger lasting 3-4 days.  Not sure how her sleep was at the time. Some crying and poor impulse control.   PCP wanted 2nd opinion from neuro on ? PD, Dr. Evonnie Nov 15. Plan: No med changes pending neurologic appointment  07/20/2022 neurology appointment Dr. Asberry Tat.  Diagnosed TD.  Assessment as follows: 1.  Tardive dyskinesia -The patients symptoms are most consistent with tardive dyskinesia.  She has had exposure to typical and atypical antipsychotic medication.  TD is a heterogeneous syndrome depending on a subtle balance between several neurotransmitters in the brain, including DA receptor blockade and hypersensitivity of DA and GABA receptors. -pt on ingrezza  since 02/2022 and both she and notes from Dr. Geoffry indicate great benefit.  2.  Tremor             -Largely resolved off of lithium  and vraylar  (vraylar  d/c in 12/2021)             -She has very minor left hand tremor.  Did tell her that Ingrezza  can occasionally cause parkinsonism, but I did not see a significant degree of that today.             -I did reassure her today that I saw no evidence of idiopathic Parkinson's disease.  She was reassured.  3.  Bipolar d/o             -difficult to control per records             -sees Dr. Geoffry frequently  4.  Discussed with patient that she really does not need neurologic follow-up at this point in time.  She was happy to hear this.  08/08/2022 appointment noted: No med changes. Still having a lot of anxiety and worries too much. Worse than depression.  No mania since here.  No sig avoidance.   Hard time  concentration. Still having irritability and anger. SE drowsy with clozapine  in AM and hard to function until about 10 AM.  Takes it about 8 PM and then goes to bed.   No falling but is shuffling more since here.   Sleep 10 hours.    09/07/22 appt noted:   Consistently on clozapine  75 mg HS.  Too drowsy if takes 25 mg in AM. Doing so so .  A lot of anxiety, anger, irritation.   Avg 8-9 hours of sleep and pushes herself to get up .  Hard to function in am until 11 or 12 noon. Ingreza 40 mg BID still some mouth movements and biting tongue.  Is better with Ingrezza  but not gone.   SE consitpation and drowsy.   She is aware of the difficulty finding balance between aenough med to manage her sx and not so much to cause intolerable SE.  Disc dosing of her meds. Plan: Augment clozapine  with fluvoxamine  25 mg HS  10/11/22 appt noted: : Current psych meds: Clozapine  75 mg nightly, Focalin  15 mg twice daily, fluvoxamine  25 mg nightly, lamotrigine  100 mg twice daily, Ingrezza  40 mg twice daily No noticeable change with fluvoxamine . Drooling worse in am and stupor until about 11 AM.   Mood still not good , angry and irritable a lot.  H acuses he rof going off her meds.  Less mouth movements and biting tonue. Sleep 9-10 hours. Anxiety still through the roof. Tremor better right now unless weak.   H prostate CA with bone mets and on pain meds. Plan: Augment clozapine  with fluvoxamine  but increase 50mg  HS also to help anxiety, irritability  11/09/2022 appointment noted: Added fluvoxamine  50 mg HS.  And is less anxious and more grounded.  Half as irritable as before fluvoxamine .   Down side is shuffling steps seem worse.  Not dizzy usually but balance isn't good.  Gets better after the day progresses.  Overall does feel improved.   Trembling better and mouth movements much better but drooling is bad nothing seems to help that. Drools in public is embarrassing. Tired a lot better as day progresses.    Plan: Augment clozapine  with fluvoxamine  but REDUCE TO 37.5 mg mg HS bc more shuffling of feet .  And reduced clozapine  to 50 mg daily.  But did help anxiety, irritability Ingrezza  for TD helped stop tongue chewing but still some mouth movements at 80 mg, which was started 05/23/22.  Shuffling a little more and can't reduce Ingrezza .  Split ingrezza  40 BID  12/12/22 appt noted: Made changes as above.  Asks about increasing Focalin  20 BID bc lack of energy and motivation and productivity.  No SE.   No jittery,  HA. Usally sleep Is good but not always. Still shuffling if gets up at night or before morning Focalin .  Then it clears up.  No change in shuffling since here last visit.  Other day used H's walker.  Like losing balance.   Mouth movements still there but not nearly as bad.  Worse if forgets a dose of Ingrezza .   Mood depressed and more anxious than last visit.  Constantly obsessive thinking about things that could go wrong.  More short tempered.  Impaitent at home only. H got bad report from onc that current chemo not workingand it is changed with limited success rate.  This affects her mood too.   No change in sleep. Plan: Plan: First 2 weeks reduce Ingrezza  to 60 mg at night to see if shuffling is better with samples. If shuffling is better call office for change in RX If so then increase clozapine  back to 3 of the 25 mg capsules and fluvoxamine  back to 50 mg .  12/19/22 TC with nurse: Velma Wilbert GRADE, LPN   TS   5/84/75 11:16 AM Note Pt was in to see her therapist, Marval Bunde today and asked to speak with nurse. She reports having apt with Dr. Geoffry last week on April 8th, and he reduced her Ingrezza  to 60 mg, she feels that the issue isn't coming from that but the Fluvoxamine  that she was put on a few months back. She reports she may not have explained herself well enough at the apt. She reports when she wakes up during the night she has to shuffle and hold on to the wall to keep  from falling. She reports her husband has cancer and she needs to be more alert and able to function in case of emergency with him.  She reports nothing changed with the decrease in that medication.    Informed her I would discuss with Dr. Geoffry and get back with her.     12/19/22 MD resp:  Manuelita.  Reduce fluvoxamine  from 1 and 1/2 tablets at night to 1/2 tablet twice daily.  Split dose to reduce SE      01/09/23 appt noted: Meds: clozapine  50 mg HS, reduced fluvoxamine  12.5 mg Am, forcalin 15 BID, Ingrezza  40 BID, lamotrigine  100 BID,  Not doing well.  Dep and high anxiety.  Some irritability.  Mood swings not dramatic.  No SI. Balance issues when up in middle of night.  Does better once takes morning meds with focalin .   Sometimes forgets afternoon Focalin . Mouth movements still there but better.  Drooling stopped. A lot of things seem like too much working.  Still some wiggling toes and may chew side of mouth , not severe. Plan: Plan:  DC fluvoxamine  Trial Viibryd  5 mg daily for 1 week then 10 mg daily.  02/06/23 appt noted: Meds:  viibryd  10 HS, clozapine  50 mg HS, off  fluvoxamine ,  focalin  15 BID, Ingrezza  40 BID, lamotrigine  100 BID,  Walking better at night wihout fluvoxamine  and less dep but mood swings and anxiety through the roof.   Seems higher than last time.  Worry about everything.   Reduced appetite.  Some am nausea.   Upset with H's drinking.  He has terminal CA, prostate stage 4 mets to bones.  Hared living with someone terminal and also alcoholic.  Living with a lot of stress.   Less falling.   Mouth movements seem worse.  No drooling. Plan: increase Clozapine  62.5 mg HS for a week for mood swings and anxity and if fails but no SE then increase to 75 mg HS. Split ingrezza  but increase bc TD worse lately to 60 mg BID  03/14/23 appt Noted: Increased clozapine  to 75 mg HS. No SE except sedating at night.   Anxiety is still high but dep seems better.  Worry over H's CA and hosp  last weekend.  Lots of medical bills.  General worry mostly about H's CA and alcoholism. Couple mood swings and racing obsessive thinking briefly. Sleeping well.  No SI Loss of appetite.   Plan: Increase to  Viibryd  15 mg daily for anxiety.   04/10/23 appt noted: Psych meds:  viibryd  incr to 20 HS 2 weeks, clozapine  75 mg HS,  focalin  usually 20 AM, Ingrezza  60 BID, lamotrigine  100 BID,  Noticed improvement in dep with incr Viibryd  20 mg without much change in anxiety.  Stress level is still high also.  H health getting worse and still drinking and fears having to call EMT bc of this. Taking Viibryd  with food. Still some dizziness but no falls. Sleep is good.  No major mood swings. Still in counseling  and working on anxiety and low confidence. Worries over having to take care of finances.   Some racing thoughts in brief spells. Wants better cotrol of anxiety with constant worry and still irritable. Plan: For anxiety and irritability, reactivity incr clozapine  to 100 mg HS  05/09/23 appt noted: Psych med: Clozapine  100 nightly, Focalin  15 twice daily, lamotrigine  100 twice daily, Ingrezza  60 mg twice daily, Viibryd  20 mg daily for 6 weeks. SE still shuffles at night bc afraid she will fall.  No worse.  No shuffling daytime. On occ taking Focalin  20 mg BID to help mood as primary benefit. Racing thoughts not often but not gone. Still a lot of worry.  Still has irritability. Some dep also but not as bad. Gets up one or two times nightly.   Plan: Increase Viibryd  30-then 40 mg daily for anxiety and depression.  06/08/23 appt noted: Psych meds as above;  Viibryd  40 mg. No SE with it as far as she knows She feels like mood has been more stable.  Still episodes of plunging but less often and briefer.  Still some bipolar moments doing things impulsively that later she regrets.   Anxiety still high but not as bad. Still shuffles at night only bc fear of losing balance but not a problem during the  day.   Racing thoughts are better.   Sleep 8-10 and not drowsy.   Still some mouth puckering but better with Ingrezza .  Wonders if it will be covered after [facility]  07/10/23 apppt noted. Psych meds: Clozapine  100 @ 8 pm, Focalin   15 twice daily forgets 2nd dose, lamotrigine  100 twice daily, Ingrezza  60 twice daily, Viibryd  40 Not well.  10 day ago manic rage, yelling , cursing , slamming doors.  Lasted about an hour.  Totally out of control.  Last night H said something triggering anger and she was aggressive in speech.  Still seem to be angry and irritable.  Also racing thoughts random but chronic anxiety over finances.   With new [employer] advantage plan.  Looking into coverage for Ingrezza .  Concerned she might not be able to afford it.   Shuffles feet and balance problems when gets up and down.  Gets better when takes focalin  I the morning.   Also having some depression    To bed 8 pm.   Plan: For anxiety and irritability, reactivity incr clozapine  to 100 mg HS and 25 mg AM    08/07/23 appt noted: No further manic anger spells.  Some obsessive thinking and dep.Obs thoughts can be about any worries; like H's health.  H alcoholic and met prostate cancer and other problems.  $ concerns and dog sick.   Palliative care nurse found out his was drinking and she had RX Oxycontin  and then she stopped RX.  He prays for death DT pain.  Thinks she would fall to pieces if he died.  H on chemo.   No falls but shuffles at night.   No napping.   Changing insurance first of year and will need PA of Ingrezza .  Don't think they will cover Focalin .   Psych meds:  clozapine  25 AM and 100 PM, Focalin  15 mg BID, lamotrigine  100 BID, Ingrezza  60 BID, Viibryd  40. ER visit with palp but none further and EKG unremarkable.   Hard time going to sleep.   So much on my mind.   To bed 8-830pm.   Sleep 10-11 hours usually. Plan INCREASE CLOZAPINE  50 MG am AND 100 MG HS  09/11/23 appt noted: Psych meds: clozapine  50 AM and 100 PM,  Focalin  15 mg BID, lamotrigine  100 BID, Ingrezza  60 BID, Viibryd  40. Dep, no motivation, stutter understress fairly new.   Some racing thoughts and obs over things seems worse.  Mainly worry about normal things.  Will obsess when goes to store that she might lose something like her keys.  That has been going on for awhile.   Getting to bed late but good sleep when falls asleep.   In the morning still shuffles but once takes morning meds it is better.  Probably from the Focalin .  Talked to [facility] at [place] but forgot to do what she said and ingrezza  denied. Generic Focalin  is covered.    No change in H's health.  Palliative care nurse involved.   She thinks anxiety and depression are sky high but dep probably worse.   Still has some mouth movements and grinding teeth.  Past Psychiatric Medication Trials: Vraylar  4.5 SE mouth movements reduced to 3 mg 3/20. It was effective at lower doses for depression.  Worse off it.  Vraylar  1.5 mg every third day led to relapse of significant depression. [place] SE at 42 mg .  Cost problems Latuda 80, , olanzapine, Seroquel, risperidone, Abilify , loxapine  25 mg BID (max tolerated) NR, Clozapine  100  Ingrezza  60 BID  helps No [place]edo   lithium  150 tremor   Trileptal  450, Depakote, Equetro  300 hx balance issues, CBZ ER falling,   Lamictal  200 twice daily,  Focalin  15 BID,  Ritalin ,   fluoxetine  60,  sertraline 100,  Wellbutrin history of facial tics, paroxetine cognitive side effects, Lexapro  10 worse Fluvoxamine  50  buspirone,   ropinirole, amantadine , Sinemet, Artane, Cogentin,  pramipexole  0.5 mg BID helped tremor but caused anger trazodone hangover, Ambien hangover,  Review of Systems:  Review of Systems  HENT:  Positive for dental problem and tinnitus.        Chirping cricket sounds in hears since January 2023 drooling  Respiratory:  Negative for cough.   Cardiovascular:  Negative for chest pain.  Gastrointestinal:  Negative for  abdominal pain and nausea.       GERD awakening her  Musculoskeletal:  Positive for arthralgias and gait problem.  Neurological:  Positive for dizziness and tremors. Negative for weakness.       Ankle problems and balance problems. No falls lately. Occ stumbles. Tremor is better in hands Mouth movements, mild lip licking.    Psychiatric/Behavioral:  Positive for dysphoric mood. Negative for agitation, behavioral problems, confusion, decreased concentration, hallucinations, self-injury, sleep disturbance and suicidal ideas. The patient is nervous/anxious. The patient is not hyperactive.   No falls since here. Not currently depressed but unable to remove this from the list.  Medications: I have reviewed the patient's current medications.  Current Outpatient Medications  Medication Sig Dispense Refill   acetaminophen  (TYLENOL ) 650 MG CR tablet Take 1,300 mg by mouth as needed for pain.     atorvastatin  (LIPITOR) 20 MG tablet Take 1 tablet (20 mg total) by mouth every evening. 90 tablet 3   cloZAPine  (CLOZARIL ) 100 MG tablet Take 1 tablet (100 mg total) by mouth at bedtime. 30 tablet 1   cloZAPine  (CLOZARIL ) 25 MG tablet Take 1 tablet (25 mg total) by mouth 2 (two) times daily. (Patient taking differently: Take 50 mg by mouth every morning.) 60 tablet 1   Ferrous Gluconate  (IRON  27 PO) Take by mouth.     FOCALIN  10 MG tablet Take 1.5 tablets (15 mg total) by mouth 2 (two) times daily. 90 tablet 0   INGREZZA  60 MG capsule TAKE 1 CAPSULE BY MOUTH TWICE A DAY 60 capsule 1   ketoconazole  (NIZORAL ) 2 % cream Apply 1 Application topically daily. 60 g 0   lamoTRIgine  (LAMICTAL ) 100 MG tablet Take 1 tablet (100 mg total) by mouth 2 (two) times daily. 180 tablet 0   Melatonin 10 MG CAPS Take by mouth at bedtime as needed.     pantoprazole  (PROTONIX ) 40 MG tablet TAKE (1) TABLET BY MOUTH TWICE DAILY. 180 tablet 1   promethazine -dextromethorphan (PROMETHAZINE -DM) 6.25-15 MG/5ML syrup Take 5 mLs by mouth 4  (four) times daily as needed. 100 mL 0   Vilazodone  HCl (VIIBRYD ) 40 MG TABS Take 1 tablet (40 mg total) by mouth daily. 90 tablet 0   No current facility-administered medications for this visit.    Medication Side Effects: Other: tremor and weight gain.   Dyskinesia appears better  SE bettter than they were.  Balance problems intermittently  Allergies:  Allergies  Allergen Reactions   Azithromycin Anaphylaxis   Penicillins Anaphylaxis    DID THE REACTION INVOLVE: Swelling of the face/tongue/throat, SOB, or low BP? Yes Sudden or severe rash/hives, skin peeling, or the inside of the mouth or nose? Yes Did it require medical treatment? No When did it last happen?       If all above answers are NO, may proceed with cephalosporin use.  Patient reacts to Z pack.  HAS Taken amoxicillin  fine.   Adhesive [Tape] Other (See Comments)    On  bandaids    Past Medical History:  Diagnosis Date   ADD (attention deficit disorder)    Allergy    Seasonal   Anemia    History of GI blood loss   Anxiety    Arthritis    Atrophy of vagina 10/07/2020   Bipolar 1 disorder (HCC)    Cancer (HCC)    Colon polyps    Depression    Diabetes mellitus (HCC)    pt denies   Edema, lower extremity    Epistaxis    Around 2011 or 2012, required cauterization.    Esophageal stricture    Fracture of superior pubic ramus (HCC) 11/28/2018   GERD (gastroesophageal reflux disease)    Headache(784.0)    Hyperlipidemia    Interstitial cystitis    Joint pain    Lactose intolerance    Lung cancer (HCC) 2002   Neuromuscular disorder (HCC)    Obesity    Osteoarthritis    Palpitations    Sleep apnea    Doesn't use a CPAP   Suicidal ideation 01/20/2020   Swallowing difficulty    Tardive dyskinesia     Family History  Problem Relation Age of Onset   Arthritis Mother    Hearing loss Mother    Hyperlipidemia Mother    Hypertension Mother    Depression Mother    Anxiety disorder Mother    Obesity  Mother    Sudden death Mother    Hypertension Father    Diabetes Mellitus II Father    Heart disease Father    Arthritis Father    Cancer Father        Brain   COPD Father    Diabetes Father    Hyperlipidemia Father    Sleep apnea Father    Early death Sister        Aneroxia/Bulimic   Depression Brother    Early death Hydrographic [employer]or Accident   Stroke Maternal Grandmother    Hypertension Maternal Grandmother    Arthritis Maternal Grandfather    Heart attack Maternal Grandfather    Hearing loss Maternal Grandfather    Depression Daughter    Drug abuse Daughter    Heart disease Daughter    Hypertension Daughter    Colon cancer Neg Hx    Esophageal cancer Neg Hx    Rectal cancer Neg Hx     Social History   Socioeconomic History   Marital status: Married    Spouse name: Not on file   Number of children: 1   Years of education: 12   Highest education level: Not on file  Occupational History   Occupation: retired  Tobacco Use   Smoking status: Never   Smokeless tobacco: Never  Vaping Use   Vaping status: Never Used  Substance and Sexual Activity   Alcohol use: Not Currently    Alcohol/week: 1.0 standard drink of alcohol    Types: 1 Glasses of wine per week    Comment: 1 glass wine q few weeks   Drug use: No   Sexual activity: Yes  Other Topics Concern   Not on file  Social History Narrative   Pt lives in [place] with husband Francis.  Followed by Dr. Geoffry for psychiatry and Marval Bunde for therapy.   Right handed   Drinks caffeine   One story home   Married lives with husband   retired   Teacher, Early Years/pre Strain: Low  Risk  (06/01/2023)   Overall Financial Resource Strain (CARDIA)    Difficulty of Paying Living Expenses: Not hard at all  Food Insecurity: No Food Insecurity (06/22/2023)   Hunger Vital Sign    Worried About Running Out of Food in the Last Year: Never true    Ran Out of Food in the Last Year: Never true   Transportation Needs: No Transportation Needs (06/22/2023)   PRAPARE - Administrator, Civil Service (Medical): No    Lack of Transportation (Non-Medical): No  Physical Activity: Inactive (06/01/2023)   Exercise Vital Sign    Days of Exercise per Week: 0 days    Minutes of Exercise per Session: 0 min  Stress: Stress Concern Present (06/01/2023)   [employer] of Occupational Health - Occupational Stress Questionnaire    Feeling of Stress : Very much  Social Connections: Socially Integrated (06/01/2023)   Social Connection and Isolation Panel [NHANES]    Frequency of Communication with Friends and Family: More than three times a week    Frequency of Social Gatherings with Friends and Family: More than three times a week    Attends Religious Services: More than 4 times per year    Active Member of [employer] or Organizations: Yes    Attends Banker Meetings: More than 4 times per year    Marital Status: Married  Catering Manager Violence: Not At Risk (06/01/2023)   Humiliation, Afraid, Rape, and Kick questionnaire    Fear of Current or Ex-Partner: No    Emotionally Abused: No    Physically Abused: No    Sexually Abused: No    Past Medical History, Surgical history, Social history, and Family history were reviewed and updated as appropriate.   Please see review of systems for further details on the patient's review from today.   Objective:   Physical Exam:  There were no vitals taken for this visit.  Physical Exam Neurological:     Mental Status: She is alert and oriented to person, place, and time.     Cranial Nerves: No dysarthria.     Gait: Gait normal.     Comments: Lip licking consistent, no worse Good gait  Psychiatric:        Attention and Perception: Attention and perception normal.        Mood and Affect: Mood is anxious and depressed.        Speech: Speech normal.        Behavior: Behavior is cooperative.        Thought Content: Thought  content normal. Thought content is not paranoid or delusional. Thought content does not include homicidal or suicidal ideation. Thought content does not include suicidal plan.        Cognition and Memory: Cognition and memory normal.        Judgment: Judgment normal.     Comments: Insight intact Ongoing residual dep, irritability, anxiety with dep worst     Lab Review:     Component Value Date/Time   NA 139 08/02/2023 1214   NA 142 03/22/2023 0916   K 4.2 08/02/2023 1214   CL 106 08/02/2023 1214   CO2 26 08/02/2023 1214   GLUCOSE 101 (H) 08/02/2023 1214   BUN 17 08/02/2023 1214   BUN 12 03/22/2023 0916   CREATININE 0.85 08/02/2023 1214   CALCIUM  9.8 08/02/2023 1214   PROT 7.2 03/22/2023 0916   ALBUMIN 4.5 03/22/2023 0916   AST 27 03/22/2023 0916   ALT 14  03/22/2023 0916   ALKPHOS 68 03/22/2023 0916   BILITOT 0.7 03/22/2023 0916   GFRNONAA >60 08/02/2023 1214   GFRAA 96 03/02/2020 1433       Component Value Date/Time   WBC 8.0 08/02/2023 1214   RBC 4.29 08/02/2023 1214   HGB 13.2 08/02/2023 1214   HGB 14.8 03/22/2023 0916   HCT 39.8 08/02/2023 1214   HCT 47.3 (H) 03/22/2023 0916   PLT 188 08/02/2023 1214   PLT 225 03/22/2023 0916   MCV 92.8 08/02/2023 1214   MCV 92 03/22/2023 0916   MCH 30.8 08/02/2023 1214   MCHC 33.2 08/02/2023 1214   RDW 14.8 08/02/2023 1214   RDW 13.8 03/22/2023 0916   LYMPHSABS 3.4 (H) 03/22/2023 0916   MONOABS 0.7 04/04/2019 1004   EOSABS 0.2 03/22/2023 0916   BASOSABS 0.0 03/22/2023 0916  Vitamin D  level 33 on 10K units daily on 12/4/`9 Increased to prescription vitamin d  50K units Monday, Wed, Friday.  Rx sent in.   Lithium  Lvl  Date Value Ref Range Status  10/21/2018 0.18 (L) 0.60 - 1.20 mmol/L Final    Comment:    Performed at [facility], [address], [place], [place] [address]     No results found for: PHENYTOIN, PHENOBARB, VALPROATE, CBMZ   .res Assessment: Plan:    Bipolar disorder with moderate depression  (HCC)  Generalized anxiety disorder  Attention deficit hyperactivity disorder (ADHD), predominantly inattentive type  Insomnia due to mental condition  Tardive dyskinesia  Mild cognitive impairment  Long term current use of clozapine   Low vitamin D  level  Tremor of both hands  30 min face to face time with patient was spent on counseling and coordination of care. We discussed multiple dxes and concerns.   Grizelda has chronic rapid cycling bipolar disorder which is chronically unstable and has been difficult to control.  Failed 14 different mood stabilizers.  The rapid cycling is making it difficult to control frequency of depressive episodes and the anxiety as well.  We have typically had to make frequent med changes.   Disc gradual increase bc med sensitivity.  Med sensitivity to SE seems to be the biggest problem preventing better mood control  Disc options for better control of dep, irritability and anxiety.  Incr Viibryd  vs clozapine   Ingrezza  for TD helped stop tongue chewing but still some mouth movements at 80 mg, which was started 05/23/22.  Shuffling only at night now.  Drooling resolved  Split ingrezza  but increase bc TD worse lately to 60 mg BID Consider Austedo .  Disc cost concerns  .  Getting grant but that might end.  Need to pick insurance that will cover it.  Disc Coverage app.  Disc ECT. Only FDA approved option left. She wants to defer.    Extensive discussion of clozapine  dosing recommendations but we will increase more slowly bc she is so med sensitive.Disc risk low WBC, cardiomyopathy, etc, sedation  (Started clozapine  on 12/07/21).    Check CBC with diff every 4 weeks.  Now.  Option increase clozapine  for anxiety.  Disc SR risk.   For anxiety and irritability, reactivity , mood swings continue clozapine  to 100 mg HS and 50 mg AM   Likely to work better than incr Viibryd  but consider latter.  She has a high residual anxiety.  It has been impossible to control all  of her symptoms simultaneously without causing side effects. Failed various meds.  Discussed side effects of each medicine. Increase  Focalin  IR 20  mg BID  for ADD and off label for depression  Try to spread this out if possible for mood.  She feels this is necessary  .  She is remembering both doses.   Discussed potential benefits, risks, and side effects of stimulants with patient to include increased heart rate, palpitations, insomnia, increased anxiety, increased irritability, or decreased appetite.  Instructed patient to contact office if experiencing any significant tolerability issues. Consider modafinil  if necessary  Would like to avoid BZ if possible.  Lamotrigine  100 mg twice daily to try to reduce polypharmacy and so improve tolerability of clozapine .  consider reduction if clozapine  helps.  Failed all reasonable alternatives for anxiety.  Option Auvelity.  She is under a lot of genuine stress.  Discussed potential metabolic side effects associated with atypical antipsychotics, as well as potential risk for movement side effects. Advised pt to contact office if movement side effects occur.    Checked B12 folate bc memory complaints.  Normal B12 & folate on 05/25/22  Disc SE meds and this is heightened by the complication of necessary polypharmacy.  Supportive  and cognitive behavioral therapy in terms of dealing with husband's addiction and now dx metastatic prostate CA..  breaking down tasks into manageable parts.  Grief.   Rec exercise as it would help.  Take advantage of family support  Continue Viibryd  40 mg daily for anxiety and depression.  It helped some Take with food.   and usually with dinner.  Requires frequent FU DT chronic instability.  Wants to schedule monthly.  FU 4 weeks.   Lorene Macintosh MD, DFAPA  Please see After Visit Summary for patient specific instructions.    Future Appointments  Date Time Provider Department Center  09/18/2023 10:00 AM Sherlynn Sober, LCSW [facility] None  09/27/2023  1:00 PM Tobie Suzzane POUR, MD [facility]  10/02/2023 10:00 AM Sherlynn Sober, LCSW [facility] None  10/02/2023  3:40 PM Anner Alm ORN, MD [facility] None  10/10/2023 10:00 AM Cottle, Lorene KANDICE Raddle., MD [facility] None  10/16/2023 10:00 AM Sherlynn Sober, LCSW [facility] None  10/30/2023 10:00 AM Sherlynn Sober, LCSW [facility] None  11/09/2023 10:30 AM Cottle, Lorene KANDICE Raddle., MD [facility] None  11/13/2023 10:00 AM Sherlynn Sober, LCSW [facility] None  11/27/2023 10:00 AM Sherlynn Sober, LCSW [facility] None  12/14/2023 10:30 AM Cottle, Lorene KANDICE Raddle., MD [facility] None  06/05/2024 10:40 AM [facility]-ANNUAL WELLNESS VISIT [facility]    No orders of the defined types were placed in this encounter.      -------------------------------

## 2023-09-12 ENCOUNTER — Telehealth: Payer: Self-pay

## 2023-09-12 NOTE — Telephone Encounter (Signed)
 Pt had visit with Dr. Geoffry today and reports [facility] advised her to do something regarding her Megan Whitney 60 mg prior authorization but was not clear. I faxed office notes and diagnosis reasons for medication to [facility].  Pending response

## 2023-09-18 ENCOUNTER — Ambulatory Visit: Payer: No Typology Code available for payment source | Admitting: Psychiatry

## 2023-09-18 NOTE — Telephone Encounter (Signed)
 Patient notified of [place] approvals for both Ingrezza and Focalin.

## 2023-09-18 NOTE — Progress Notes (Deleted)
 [facility] Counselor/Therapist Progress Note  Patient ID: Megan Whitney, MRN: [id number],    Date: 09/18/2023  Time Spent: ***   Treatment Type: {CHL AMB THERAPY TYPES:[id number]}  Reported Symptoms: ***   bipolar, anxiety, depression, some fear, some obsessiveness, anger much improved       Mental Status Exam:  Appearance:   {PSY:22683}     Behavior:  {PSY:[id number]}  Motor:  {PSY:22302}  Speech/Language:   {PSY:22685}  Affect:  {PSY:22687}  Mood:  {PSY:31886}  Thought process:  {PSY:31888}  Thought content:    {PSY:[phone]}  Sensory/Perceptual disturbances:    {[phone]}  Orientation:  {PSY:30297}  Attention:  {PSY:22877}  Concentration:  {PSY:306 528 6787}  Memory:  {PSY:[phone]}  Fund of knowledge:   {PSY:306 528 6787}  Insight:    {PSY:306 528 6787}  Judgment:   {PSY:306 528 6787}  Impulse Control:  {PSY:306 528 6787}   Risk Assessment: Danger to Self:  {PSY:22692} Self-injurious Behavior: {PSY:22692} Danger to Others: {PSY:22692} Duty to Warn:{PSY:311194} Physical Aggression / Violence:{PSY:21197} Access to Firearms a concern: {PSY:21197} Gang Involvement:{PSY:21197}  Subjective: ***      Patient in today and reports some improvement in her anger and obsessiveness and fears. Enjoyed pleasant Christmas holidays with extended family. Husband still battling cancer but was involved with family during holidays which felt good to patient. No panic attacks since last appt. Mood seems some better today. Reports working on her anxiety, depression, fears, self esteem, anger, and trying to not judge herself. Feels she is managing fears some better. Sleep , once I get to sleep, is ok. States she does need to get back on schedule of going to bed a little earlier. States she is still confronting more of her fears as processed in sessions and able to talk myself down at times. Getting better at managing my fears.  More proactive at times. No SI and fewer  crying spells reported. Some decrease in her anxiety and recognizing that is a positive feeling for her. Trying not to assume the negative and I'm getting better in following through on that. Continued work on making healthy decisions, improving her self-care, and setting healthy limits for herself. Trying to better manage her feelings about husband's illness and setting healthier limits.     Interventions: {PSY:[id number]}  Long Term Goal: Reduce overall level, frequency, and intensity of the anxiety so that daily functioning is not impaired. Short Term Goal: 1.Increase understanding of the beliefs and messages that produce the worry and anxiety. Strategies: 1.Help client develop reality-based positive cognitive messages/self-talk. 2. Develop a coping card or other reminder which coping strategies are recorded for patient's later use.   Diagnosis:No diagnosis found.    Plan: ***Patient today participating well in session as she focused further on     Patient showing continued motivation and her appointment today as she worked further on her goal-directed behaviors particularly her anxiety, obsessiveness, depression, fears, and bipolar. Working more intentionally on her goals and noting some progress. Continues to be angry at times with husband, but also concerned as he does not always follow medical advice. Patient has shown progress in working on her goals and needs to continue goal-directed behaviors to move forward in a more healthy and hopeful direction.      /////////////////////////////////////////////////////////////////////////////////////////////////////////////////   Review with patient:    Encouraged patient and her ongoing efforts to re-focus on her more self affirming and positive self talk and behaviors as noted in sessions including: Using good judgment and making thought-through decisions versus impulsive decisions, letting friends be supportive of her,  refrain from self sabotaging regarding her goals or relationships with others, use of journaling between sessions which has been helpful for her previously, set limits with people who are not supportive, refrain from assuming worst-case scenarios, look for the strengths and the positives within herself, engage frequently with her 2 dogs which is very therapeutic for her, and realize the strength she shows working with goal-directed behaviors to move in a direction that supports her improved emotional and physical health and her outlook into the future.  Goal review and progress/challenges noted with patient.  Next appointment within 2 weeks.   Barnie Bunde, LCSW

## 2023-09-18 NOTE — Telephone Encounter (Signed)
 Faxed prior authorization request for Ingrezza  60 mg capsule to [employer] per requested information, approval received effective 09/15/2023-12/12/2023 (3 month approval) EJ#[id number]  Faxed prior authorization request for Focalin  10 mg tablet to [employer] per requested information, approval received effective 09/16/2023-09/04/2024.  EJ#[id number]

## 2023-09-19 ENCOUNTER — Other Ambulatory Visit: Payer: Self-pay | Admitting: Psychiatry

## 2023-09-19 DIAGNOSIS — G2401 Drug induced subacute dyskinesia: Secondary | ICD-10-CM

## 2023-09-21 ENCOUNTER — Other Ambulatory Visit: Payer: Self-pay | Admitting: Psychiatry

## 2023-09-21 DIAGNOSIS — F3132 Bipolar disorder, current episode depressed, moderate: Secondary | ICD-10-CM

## 2023-09-24 ENCOUNTER — Other Ambulatory Visit: Payer: Self-pay | Admitting: Psychiatry

## 2023-09-24 DIAGNOSIS — F3132 Bipolar disorder, current episode depressed, moderate: Secondary | ICD-10-CM

## 2023-09-25 ENCOUNTER — Other Ambulatory Visit: Payer: Self-pay | Admitting: Psychiatry

## 2023-09-25 DIAGNOSIS — F3132 Bipolar disorder, current episode depressed, moderate: Secondary | ICD-10-CM

## 2023-09-25 NOTE — Telephone Encounter (Signed)
Changed sig and disp amount.   For anxiety and irritability, reactivity , mood swings continue clozapine to 100 mg HS and 50 mg AM  LV 1/6

## 2023-09-27 ENCOUNTER — Ambulatory Visit (INDEPENDENT_AMBULATORY_CARE_PROVIDER_SITE_OTHER): Payer: PPO | Admitting: Internal Medicine

## 2023-09-27 ENCOUNTER — Encounter: Payer: Self-pay | Admitting: Internal Medicine

## 2023-09-27 VITALS — BP 124/80 | HR 96 | Ht 60.0 in | Wt 129.0 lb

## 2023-09-27 DIAGNOSIS — F988 Other specified behavioral and emotional disorders with onset usually occurring in childhood and adolescence: Secondary | ICD-10-CM | POA: Diagnosis not present

## 2023-09-27 DIAGNOSIS — F319 Bipolar disorder, unspecified: Secondary | ICD-10-CM | POA: Diagnosis not present

## 2023-09-27 DIAGNOSIS — G2401 Drug induced subacute dyskinesia: Secondary | ICD-10-CM | POA: Diagnosis not present

## 2023-09-27 DIAGNOSIS — M51362 Other intervertebral disc degeneration, lumbar region with discogenic back pain and lower extremity pain: Secondary | ICD-10-CM | POA: Diagnosis not present

## 2023-09-27 DIAGNOSIS — E559 Vitamin D deficiency, unspecified: Secondary | ICD-10-CM | POA: Diagnosis not present

## 2023-09-27 DIAGNOSIS — Z79899 Other long term (current) drug therapy: Secondary | ICD-10-CM | POA: Diagnosis not present

## 2023-09-27 DIAGNOSIS — R7303 Prediabetes: Secondary | ICD-10-CM | POA: Diagnosis not present

## 2023-09-27 DIAGNOSIS — E782 Mixed hyperlipidemia: Secondary | ICD-10-CM | POA: Diagnosis not present

## 2023-09-27 DIAGNOSIS — F3132 Bipolar disorder, current episode depressed, moderate: Secondary | ICD-10-CM

## 2023-09-27 NOTE — Assessment & Plan Note (Signed)
Likely due to antipsychotic Has had neurology evaluation - visit note reviewed, on Ingrezza

## 2023-09-27 NOTE — Assessment & Plan Note (Signed)
Well-controlled with Focalin

## 2023-09-27 NOTE — Assessment & Plan Note (Signed)
Lab Results  Component Value Date   HGBA1C 5.6 03/22/2023   Advised to follow low carb diet for now

## 2023-09-27 NOTE — Patient Instructions (Addendum)
 Please continue to take medications as prescribed.  Please continue to follow low carb diet and perform moderate exercise/walking as tolerated.  Please perform simple back exercises.

## 2023-09-27 NOTE — Assessment & Plan Note (Signed)
On Atorvastatin Lipid profile reviewed 

## 2023-09-27 NOTE — Assessment & Plan Note (Addendum)
Chronic low back pain Check x-ray of lumbar spine-likely has DDD of lumbar spine Previous MRI lumbar spine reviewed Has tried PT recently without much benefit Avoid heavy lifting and frequent bending Continue Tylenol as needed for back pain, avoided oral NSAIDs due to GERD Heating pad and/or back brace as needed Simple back exercises material provided Referred to PM&R

## 2023-09-27 NOTE — Progress Notes (Addendum)
 Established Patient Office Visit  Subjective:  Patient ID: Megan Whitney, female    DOB: [birth year]  Age: 68 y.o. MRN: [id number]  CC:  Chief Complaint  Patient presents with   Care Management    Six month follow up   Gastroesophageal Reflux    HPI Megan Whitney is a 68 y.o. female with past medical history of bipolar disorder, ADD, anxiety, OSA, GERD, HLD and osteopenia who presents for f/u of her chronic medical conditions.    Bipolar disorder: She is on multiple medications for bipolar disorder and ADD. She follows up with Psychiatrist and has a therapist, who she visits every week.  GERD and hiatal hernia: She takes Protonix  twice daily for it.  She was referred to general surgeon by her GI provider for hiatal hernia. She currently denies any nausea or vomiting, but has chronic acid reflux and epigastric pain.  Denies any melena or hematochezia currently.  She reports chronic, intermittent low back pain.  Pain is sharp, nonradiating and and is worse in the evening.  Denies any recent fall or injury.  Denies any new numbness or tingling of the LE.  Denies any urinary or stool incontinence.  Chart review shows old MRI in 2006, which showed disc protrusion at L2-3 level.  She has tried Tylenol  without much relief.  She also did PT without much benefit.  Blood tests were reviewed and discussed with the patient in detail.     Past Medical History:  Diagnosis Date   ADD (attention deficit disorder)    Allergy    Seasonal   Anemia    History of GI blood loss   Anxiety    Arthritis    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    Esophageal stricture    Fracture of superior pubic ramus (HCC) 11/28/2018   GERD (gastroesophageal reflux disease)    Headache(784.0)    Hyperlipidemia    Interstitial cystitis    Joint pain     Lactose intolerance    Lung cancer (HCC) 2002   Neuromuscular disorder (HCC)    Obesity    Osteoarthritis    Osteoporosis    Palpitations    Sleep apnea    Doesn't use a CPAP   Suicidal ideation 01/20/2020   Swallowing difficulty    Tardive dyskinesia     Past Surgical History:  Procedure Laterality Date   BALLOON DILATION  05/16/2012   Procedure: BALLOON DILATION;  Surgeon: Lamar JONETTA Aho, MD;  Location: [facility] ENDOSCOPY;  Service: Endoscopy;  Laterality: N/A;   BUNIONECTOMY  2011   COLONOSCOPY     ENTEROSCOPY  05/16/2012   Procedure: ENTEROSCOPY;  Surgeon: Lamar JONETTA Aho, MD;  Location[facility] ENDOSCOPY;  Service: Endoscopy;  Laterality: N/A;   JOINT REPLACEMENT     right shoulder durgery 25 yrs ago  1988   TOTAL HIP ARTHROPLASTY Bilateral 2006, 2008   bilateral   TUBAL LIGATION  1990   WEDGE RESECTION  2002   lung cancer    Family History  Problem Relation Age of Onset   Arthritis Mother    Hearing loss Mother    Hyperlipidemia Mother    Hypertension Mother    Depression Mother    Anxiety disorder Mother    Obesity Mother  Sudden death Mother    Hypertension Father    Diabetes Mellitus II Father    Heart disease Father    Arthritis Father    Cancer Father        Brain   COPD Father    Diabetes Father    Hyperlipidemia Father    Sleep apnea Father    Early death Sister        Aneroxia/Bulimic   Depression Brother    Early death Hydrographic surveyor Accident   Stroke Maternal Grandmother    Hypertension Maternal Grandmother    Arthritis Maternal Grandfather    Heart attack Maternal Grandfather    Hearing loss Maternal Grandfather    Depression Daughter    Drug abuse Daughter    Heart disease Daughter    Hypertension Daughter    Colon cancer Neg Hx    Esophageal cancer Neg Hx    Rectal cancer Neg Hx    Colon polyps Neg Hx    Stomach cancer Neg Hx     Social History   Socioeconomic History   Marital status: Married    Spouse name: Not on file    Number of children: 1   Years of education: 12   Highest education level: Not on file  Occupational History   Occupation: retired  Tobacco Use   Smoking status: Never   Smokeless tobacco: Never  Vaping Use   Vaping status: Never Used  Substance and Sexual Activity   Alcohol use: Not Currently    Alcohol/week: 1.0 standard drink of alcohol    Types: 1 Glasses of wine per week    Comment: 1 glass wine occassionally   Drug use: No   Sexual activity: Yes  Other Topics Concern   Not on file  Social History Narrative   Pt lives in [place] with husband Francis.  Followed by Dr. Geoffry for psychiatry and Marval Bunde for therapy.   Right handed   Drinks caffeine   One story home   Married lives with husband   retired   Chief Executive Officer Drivers of Corporate investment banker Strain: Low Risk  (06/01/2023)   Overall Financial Resource Strain (CARDIA)    Difficulty of Paying Living Expenses: Not hard at all  Food Insecurity: No Food Insecurity (06/22/2023)   Hunger Vital Sign    Worried About Running Out of Food in the Last Year: Never true    Ran Out of Food in the Last Year: Never true  Transportation Needs: No Transportation Needs (06/22/2023)   PRAPARE - Administrator, Civil Service (Medical): No    Lack of Transportation (Non-Medical): No  Physical Activity: Inactive (06/01/2023)   Exercise Vital Sign    Days of Exercise per Week: 0 days    Minutes of Exercise per Session: 0 min  Stress: Stress Concern Present (06/01/2023)   [employer] of Occupational Health - Occupational Stress Questionnaire    Feeling of Stress : Very much  Social Connections: Socially Integrated (06/01/2023)   Social Connection and Isolation Panel    Frequency of Communication with Friends and Family: More than three times a week    Frequency of Social Gatherings with Friends and Family: More than three times a week    Attends Religious Services: More than 4 times per year    Active Member of  [employer] or Organizations: Yes    Attends Banker Meetings: More than 4 times per year  Marital Status: Married  Catering manager Violence: Not At Risk (06/01/2023)   Humiliation, Afraid, Rape, and Kick questionnaire    Fear of Current or Ex-Partner: No    Emotionally Abused: No    Physically Abused: No    Sexually Abused: No    Outpatient Medications Prior to Visit  Medication Sig Dispense Refill   acetaminophen  (TYLENOL ) 650 MG CR tablet Take 1,300 mg by mouth as needed for pain.     Ferrous Gluconate  (IRON  27 PO) Take by mouth.     ketoconazole  (NIZORAL ) 2 % cream Apply 1 Application topically daily. 60 g 0   pantoprazole  (PROTONIX ) 40 MG tablet TAKE (1) TABLET BY MOUTH TWICE DAILY. 180 tablet 1   atorvastatin  (LIPITOR) 20 MG tablet Take 1 tablet (20 mg total) by mouth every evening. 90 tablet 3   cloZAPine  (CLOZARIL ) 100 MG tablet Take 1 tablet (100 mg total) by mouth at bedtime. 30 tablet 0   cloZAPine  (CLOZARIL ) 25 MG tablet TAKE 2 TABLETS EVERY MORNING BY MOUTH 60 tablet 0   dexmethylphenidate  (FOCALIN ) 10 MG tablet Take 2 tablets (20 mg total) by mouth 2 (two) times daily. 120 tablet 0   INGREZZA  60 MG capsule TAKE 1 CAPSULE BY MOUTH TWICE A DAY 60 capsule 1   lamoTRIgine  (LAMICTAL ) 100 MG tablet Take 1 tablet (100 mg total) by mouth 2 (two) times daily. 60 tablet 0   Melatonin 10 MG CAPS Take by mouth at bedtime as needed. (Patient not taking: Reported on 10/24/2023)     promethazine -dextromethorphan (PROMETHAZINE -DM) 6.25-15 MG/5ML syrup Take 5 mLs by mouth 4 (four) times daily as needed. 100 mL 0   Vilazodone  HCl (VIIBRYD ) 40 MG TABS Take 1 tablet (40 mg total) by mouth daily. 90 tablet 0   No facility-administered medications prior to visit.    Allergies  Allergen Reactions   Azithromycin Anaphylaxis   Penicillins Anaphylaxis    DID THE REACTION INVOLVE: Swelling of the face/tongue/throat, SOB, or low BP? Yes Sudden or severe rash/hives, skin peeling, or the  inside of the mouth or nose? Yes Did it require medical treatment? No When did it last happen?       If all above answers are NO, may proceed with cephalosporin use.  Patient reacts to Z pack.  HAS Taken amoxicillin  fine.   Adhesive [Tape] Other (See Comments)    On bandaids    ROS Review of Systems  Constitutional:  Negative for chills and fever.  HENT:  Negative for congestion, sinus pressure, sinus pain and sore throat.   Eyes:  Negative for pain and discharge.  Respiratory:  Negative for cough and shortness of breath.   Cardiovascular:  Negative for palpitations.  Gastrointestinal:  Negative for diarrhea, nausea and vomiting.  Endocrine: Negative for polydipsia and polyuria.  Genitourinary:  Negative for dysuria and hematuria.  Musculoskeletal:  Positive for back pain. Negative for neck pain and neck stiffness.  Skin:  Negative for rash.  Neurological:  Negative for dizziness and weakness.  Psychiatric/Behavioral:  Positive for sleep disturbance. Negative for agitation and behavioral problems. The patient is nervous/anxious.       Objective:    Physical Exam Vitals reviewed.  Constitutional:      General: She is not in acute distress.    Appearance: She is obese. She is not diaphoretic.  HENT:     Head: Normocephalic and atraumatic.     Nose: Nose normal. No nasal tenderness.     Right Sinus: No maxillary sinus tenderness.  Left Sinus: No maxillary sinus tenderness.     Mouth/Throat:     Mouth: Mucous membranes are moist.  Eyes:     General: No scleral icterus.    Extraocular Movements: Extraocular movements intact.  Cardiovascular:     Rate and Rhythm: Normal rate and regular rhythm.     Pulses: Normal pulses.     Heart sounds: Normal heart sounds. No murmur heard. Pulmonary:     Breath sounds: Normal breath sounds. No wheezing or rales.  Musculoskeletal:     Cervical back: Neck supple. No tenderness.     Lumbar back: Tenderness present. Decreased range  of motion.     Right lower leg: No edema.     Left lower leg: No edema.  Skin:    General: Skin is warm.     Findings: No rash.  Neurological:     General: No focal deficit present.     Mental Status: She is alert and oriented to person, place, and time.     Sensory: No sensory deficit.     Motor: No weakness.     Gait: Gait abnormal.     Comments: Resting tremor of right hand  Psychiatric:        Behavior: Behavior normal.        Thought Content: Thought content normal.     BP 124/80 (BP Location: Right Arm, Patient Position: Sitting, Cuff Size: Normal)   Pulse 96   Ht 5' (1.524 m)   Wt 129 lb (58.5 kg)   SpO2 93%   BMI 25.19 kg/m  Wt Readings from Last 3 Encounters:  03/27/24 115 lb 6.4 oz (52.3 kg)  11/17/23 124 lb (56.2 kg)  10/24/23 124 lb (56.2 kg)    Lab Results  Component Value Date   TSH 1.370 03/22/2024   Lab Results  Component Value Date   WBC 6.9 03/22/2024   HGB 13.6 03/22/2024   HCT 42.0 03/22/2024   MCV 93 03/22/2024   PLT 220 03/22/2024   Lab Results  Component Value Date   NA 142 03/22/2024   K 4.1 03/22/2024   CO2 22 03/22/2024   GLUCOSE 99 03/22/2024   BUN 14 03/22/2024   CREATININE 1.08 (H) 03/22/2024   BILITOT 0.5 03/22/2024   ALKPHOS 71 03/22/2024   AST 27 03/22/2024   ALT 17 03/22/2024   PROT 6.8 03/22/2024   ALBUMIN 4.5 03/22/2024   CALCIUM  9.7 03/22/2024   ANIONGAP 7 08/02/2023   EGFR 56 (L) 03/22/2024   GFR 69.31 04/04/2019   Lab Results  Component Value Date   CHOL 197 03/22/2024   Lab Results  Component Value Date   HDL 92 03/22/2024   Lab Results  Component Value Date   LDLCALC 92 03/22/2024   Lab Results  Component Value Date   TRIG 74 03/22/2024   Lab Results  Component Value Date   CHOLHDL 2.1 03/22/2024   Lab Results  Component Value Date   HGBA1C 5.3 03/22/2024      Assessment & Plan:   Problem List Items Addressed This Visit       Nervous and Auditory   Neuroleptic-induced tardive  dyskinesia   Likely due to antipsychotic Has had neurology evaluation - visit note reviewed, on Ingrezza       Relevant Orders   TSH (Completed)   CMP14+EGFR (Completed)   CBC with Differential/Platelet (Completed)     Musculoskeletal and Integument   DDD (degenerative disc disease), lumbar   Chronic low back pain Check  x-ray of lumbar spine-likely has DDD of lumbar spine Previous MRI lumbar spine reviewed Has tried PT recently without much benefit Avoid heavy lifting and frequent bending Continue Tylenol  as needed for back pain, avoided oral NSAIDs due to GERD Heating pad and/or back brace as needed Simple back exercises material provided Referred to PM&R       Relevant Orders   Ambulatory referral to Physical Medicine Rehab     Other   Bipolar disorder (HCC) - Primary   On Viibryd , Lamictal , Ingrezza , clozapine  Follows up with Psychiatry [facility] - 30/30      Relevant Orders   TSH (Completed)   CMP14+EGFR (Completed)   CBC with Differential/Platelet (Completed)   Hyperlipidemia   On Atorvastatin  Lipid profile reviewed      Relevant Orders   Lipid panel (Completed)   Attention deficit disorder   Well-controlled with Focalin       Relevant Orders   TSH (Completed)   Prediabetes   Lab Results  Component Value Date   HGBA1C 5.6 03/22/2023   Advised to follow low carb diet for now      Relevant Orders   Hemoglobin A1c (Completed)   Other Visit Diagnoses       Vitamin D  deficiency       Relevant Orders   VITAMIN D  25 Hydroxy (Vit-D Deficiency, Fractures) (Completed)         No orders of the defined types were placed in this encounter.   Follow-up: Return in about 6 months (around 03/26/2024) for Annual physical (after 03/26/24).    Suzzane MARLA Blanch, MD

## 2023-09-27 NOTE — Assessment & Plan Note (Signed)
On Viibryd, Lamictal, Ingrezza, clozapine Follows up with Psychiatry [facility] - 30/30

## 2023-09-28 ENCOUNTER — Encounter: Payer: Self-pay | Admitting: Psychiatry

## 2023-09-29 ENCOUNTER — Encounter: Payer: Self-pay | Admitting: Internal Medicine

## 2023-10-02 ENCOUNTER — Ambulatory Visit: Payer: Self-pay | Admitting: Cardiology

## 2023-10-02 ENCOUNTER — Ambulatory Visit: Payer: PPO | Admitting: Psychiatry

## 2023-10-02 DIAGNOSIS — F3132 Bipolar disorder, current episode depressed, moderate: Secondary | ICD-10-CM | POA: Diagnosis not present

## 2023-10-02 NOTE — Progress Notes (Signed)
[facility] Counselor/Therapist Progress Note  Patient ID: ELEA HOLTZCLAW, MRN: [id number],    Date: 10/02/2023  Time Spent: 55 minutes   Treatment Type: Individual Therapy  Reported Symptoms: anxiety, "bipolar", depression "but some better"  Mental Status Exam:  Appearance:   Casual and Neat     Behavior:  Appropriate, Sharing, and Motivated  Motor:  Normal  Speech/Language:   Clear and Coherent  Affect:  Anxious, some depression  Mood:  anxious and depressed  Thought process:  goal directed  Thought content:    Rumination  Sensory/Perceptual disturbances:    WNL  Orientation:  oriented to person, place, time/date, situation, day of week, month of year, year, and stated date of Jan. 27, 2025  Attention:  Fair  Concentration:  Good and Fair  Memory:  WNL  Fund of knowledge:   Good  Insight:    Good  Judgment:   Good and Fair  Impulse Control:  Good and Fair   Risk Assessment: Danger to Self:  No Self-injurious Behavior: No Danger to Others: No Duty to Warn:no Physical Aggression / Violence:No  Access to Firearms a concern: No  Gang Involvement:No   Subjective:  Patient in for appointment today reporting symptoms of anxiety, "bipolar", and depression (although some better). Reports she is still doing some better "with my obsessiveness, anger, and fears,although "some rumination". One of her dogs has an eye issue and will need surgery. Today reporting anxiety and depression and some difficulty making decisions because of her anxiety and  depression. Fears "pretty strong right now but it can change" both when I'm alone and when I'm with other people. Also some hurt within family when grand-daughter visits. Wants to do some projects at home and trying to focus more on those needs. Sleeping well, eating well and has maintained her weight loss. No further panic attacks since last appt. Expressed and worked through her fearfulness "into the future, anger about his drinking  that continues despite it being against medical advice. Patient plans to talk further with nurse this week. Also plans to start knitting again which has helped her emotionally in the past.  Mood today seems a little more leveled out but acknowledges there are times when it can be up and down, especially when conflicts at home occur or husband's cancer issues increase.  Encouraged patient not to be judging herself, which came out in some of her conversation today.  She actually is managing better more of the time by getting adequate sleep, staying on her medications, and trying to work on more positive self talk both in and outside of sessions.  Managing fear better at times and overall is showing more proactive behavior more often.  Also encouraged her to continue making healthy decisions practicing positive self talk and being careful to set healthy limits for herself as she continues to be husband's primary caretaker.  Interventions: Cognitive Behavioral Therapy and Ego-Supportive  Long Term Goal: Reduce overall level, frequency, and intensity of the anxiety so that daily functioning is not impaired. Short Term Goal: 1.Increase understanding of the beliefs and messages that produce the worry and anxiety. Strategies: 1.Help client develop reality-based positive cognitive messages/self-talk. 2. Develop a "coping card" or other reminder which coping strategies are recorded for patient's later use.   Diagnosis:   ICD-10-CM   1. Bipolar disorder with moderate depression (HCC)  F31.32      Plan:  Patient today focusing more on her work with goal-directed behaviors including anxiety, "  my bipolar issues", obsessiveness, depression, and some fears.  She has noticeably been more intent and working on her goals and this has helped result in more progress even though she does have "ups and downs" at times, "but not all are like when I am "bipolar".  Still having some times of being angry with husband when he  does not follow medical advice and continues to support him and doing just the opposite and following medical advice.  Encouraged patient and her ongoing efforts to refocus with her positive self talk and self affirming behaviors as noted in sessions including: Use of good judgment and making thought-through decisions versus impulsive decisions, letting friends be supportive of her, refrain from self sabotaging regarding her goals of relationships with others, use of journaling between sessions which has been helpful previously, setting limits with people who are not supportive or aggravate her situation, refrain from assuming worst-case scenarios, look for the strengths and the positives within herself, engage frequently with her 2 dogs which is very therapeutic for her, and realize the strength she shows working with goal-directed behaviors to move in a direction that supports her improved emotional/physical health, and her overall outlook into the future.  Patient has definitely made progress and needs to continue working with goal-directed behaviors to move in a more hopeful and healthier direction.  Goal review and progress/challenges noted with patient.  Next appointment within 2 weeks.   Mathis Fare, LCSW

## 2023-10-03 ENCOUNTER — Ambulatory Visit: Payer: PPO | Admitting: Physical Medicine and Rehabilitation

## 2023-10-03 ENCOUNTER — Encounter: Payer: Self-pay | Admitting: Physical Medicine and Rehabilitation

## 2023-10-03 DIAGNOSIS — M546 Pain in thoracic spine: Secondary | ICD-10-CM | POA: Diagnosis not present

## 2023-10-03 DIAGNOSIS — G8929 Other chronic pain: Secondary | ICD-10-CM

## 2023-10-03 DIAGNOSIS — M545 Low back pain, unspecified: Secondary | ICD-10-CM | POA: Diagnosis not present

## 2023-10-03 DIAGNOSIS — M7918 Myalgia, other site: Secondary | ICD-10-CM | POA: Diagnosis not present

## 2023-10-03 NOTE — Progress Notes (Unsigned)
Megan Whitney - 68 y.o. female MRN [id number]  Date of birth: May 28, 1956  Office Visit Note: Visit Date: 10/03/2023 PCP: Anabel Halon, MD Referred by: Anabel Halon, MD  Subjective: Chief Complaint  Patient presents with   Lower Back - Pain   HPI: Megan Whitney is a 68 y.o. female who comes in today per the request of Dr. Trena Platt for evaluation of chronic, worsening and severe bilateral thoracic back pain radiating down to bilateral lower back. No pain radiating down the legs. Pain ongoing for several years, worsened over the last several months. Her pain becomes severe with standing and activity. She reports significant pain with household chores. She describes pain as sore and aching sensation, currently rates as 2 out of 10. Some relief of pain with home exercise regimen, rest and use of medications. History of formal physical therapy with minimal relief of pain. Lumbar radiographs from 2024 shows mild curvature of the spine, mild multilevel degenerative changes and grade 1 anterolisthesis of L4 on L5. She reports history of lumbar epidural steroid injections at [facility] in 2006, no relief of pain with these injections. No recent lumbar MRI imaging. Patient denies focal weakness, numbness and tingling. No recent trauma or falls      Review of Systems  Musculoskeletal:  Positive for back pain and myalgias.  Neurological:  Negative for tingling, sensory change, focal weakness and weakness.  All other systems reviewed and are negative.  Otherwise per HPI.  Assessment & Plan: Visit Diagnoses:    ICD-10-CM   1. Chronic bilateral thoracic back pain  M54.6 Ambulatory referral to Orthopedic Surgery   G89.29     2. Chronic bilateral low back pain without sciatica  M54.50 Ambulatory referral to Orthopedic Surgery   G89.29     3. Myofascial pain syndrome  M79.18 Ambulatory referral to Orthopedic Surgery       Plan: Findings:  Chronic, worsening and severe  bilateral thoracic back pain radiating down to bilateral lower back. No radicular symptoms down the legs. Patient continues to have severe pain despite good conservative therapies such as formal physical therapy, home exercise regimen, rest and use of medications. Patients clinical presentation and exam are complex, differentials include facet mediated pain and myofascial pain syndrome. Myofascial tenderness noted to bilateral thoracic and lumbar paraspinal regions. We discussed treatment plan in detail today. Patient voiced to me that she would like to be seen by Dr. Venita Lick with [facility]. I will go ahead and place referral today. I told patient it would be best to let him evaluate her and order imaging studies as he feels appropriate. Patient has no questions at the time. No red flag symptoms noted upon exam today.     Meds & Orders: No orders of the defined types were placed in this encounter.   Orders Placed This Encounter  Procedures   Ambulatory referral to Orthopedic Surgery    Follow-up: Return for Referral to Dr. Venita Lick wtih [facility].   Procedures: No procedures performed      Clinical History: EXAM: LUMBAR SPINE - COMPLETE 4+ VIEW   COMPARISON:  MR lumbar spine February 22, 2005   FINDINGS: There is no evidence of lumbar spine fracture. Mild curvature of spine. Minimal facet joint sclerosis in the lower lumbar spine. Minimal anterior spurring with narrow intervertebral spaces in lower thoracic and upper lumbar spine. Grade 1 anterolisthesis of L4 on L5.   IMPRESSION: Mild degenerative joint changes of lumbar spine.  Electronically Signed   By: Sherian Rein M.D.   On: 05/24/2023 10:23   She reports that she has never smoked. She has never used smokeless tobacco.  Recent Labs    03/22/23 0916  HGBA1C 5.6    Objective:  VS:  HT:    WT:   BMI:     BP:   HR: bpm  TEMP: ( )  RESP:  Physical Exam Vitals and nursing note reviewed.  HENT:      Head: Normocephalic and atraumatic.     Right Ear: External ear normal.     Left Ear: External ear normal.     Nose: Nose normal.     Mouth/Throat:     Mouth: Mucous membranes are moist.  Eyes:     Extraocular Movements: Extraocular movements intact.  Cardiovascular:     Rate and Rhythm: Normal rate.     Pulses: Normal pulses.  Pulmonary:     Effort: Pulmonary effort is normal.  Abdominal:     General: Abdomen is flat. There is no distension.  Musculoskeletal:        General: Tenderness present.     Cervical back: Normal range of motion.     Comments: Patient is slow to rise from seated position to standing. Good lumbar range of motion. No pain noted with facet loading. 5/5 strength noted with bilateral hip flexion, knee flexion/extension, ankle dorsiflexion/plantarflexion and EHL. No clonus noted bilaterally. No pain upon palpation of greater trochanters. No pain with internal/external rotation of bilateral hips. Sensation intact bilaterally. Myofascial tenderness noted to bilateral thoracic and lumbar regions. Negative slump test bilaterally. Ambulates without aid, gait steady.     Skin:    General: Skin is warm and dry.     Capillary Refill: Capillary refill takes less than 2 seconds.  Neurological:     General: No focal deficit present.     Mental Status: She is alert and oriented to person, place, and time.  Psychiatric:        Mood and Affect: Mood normal.        Behavior: Behavior normal.     Ortho Exam  Imaging: No results found.  Past Medical/Family/Surgical/Social History: Medications & Allergies reviewed per EMR, new medications updated. Patient Active Problem List   Diagnosis Date Noted   Attention deficit disorder 09/27/2023   Prediabetes 09/27/2023   DDD (degenerative disc disease), lumbar 03/27/2023   Class 1 obesity due to excess calories without serious comorbidity in adult 11/29/2022   Chronic thoracic back pain 09/22/2022   Neuroleptic-induced tardive  dyskinesia 07/20/2022   Intertrigo 05/19/2022   Encounter for general adult medical examination with abnormal findings 03/22/2022   Arthritis of knee 08/17/2021   Polyp of colon 02/04/2021   Seasonal allergies 10/07/2020   Osteopenia 10/07/2020   Gait disturbance 10/07/2020   Hyperlipidemia 06/11/2020   Sleep apnea 06/11/2020   Stricture and stenosis of esophagus 05/16/2012   Hiatal hernia 05/16/2012   Dysphagia 05/15/2012   Depression    Bipolar disorder (HCC)    GERD (gastroesophageal reflux disease) 09/14/2010   History of colonic polyps 09/14/2010   Past Medical History:  Diagnosis Date   ADD (attention deficit disorder)    Allergy    Seasonal   Anemia    History of GI blood loss   Anxiety    Arthritis    Atrophy of vagina 10/07/2020   Bipolar 1 disorder (HCC)    Cancer (HCC)    Colon polyps    Depression  Diabetes mellitus (HCC)    pt denies   Edema, lower extremity    Epistaxis    Around 2011 or 2012, required cauterization.    Esophageal stricture    Fracture of superior pubic ramus (HCC) 11/28/2018   GERD (gastroesophageal reflux disease)    Headache(784.0)    Hyperlipidemia    Interstitial cystitis    Joint pain    Lactose intolerance    Lung cancer (HCC) 2002   Neuromuscular disorder (HCC)    Obesity    Osteoarthritis    Palpitations    Sleep apnea    Doesn't use a CPAP   Suicidal ideation 01/20/2020   Swallowing difficulty    Tardive dyskinesia    Family History  Problem Relation Age of Onset   Arthritis Mother    Hearing loss Mother    Hyperlipidemia Mother    Hypertension Mother    Depression Mother    Anxiety disorder Mother    Obesity Mother    Sudden death Mother    Hypertension Father    Diabetes Mellitus II Father    Heart disease Father    Arthritis Father    Cancer Father        Brain   COPD Father    Diabetes Father    Hyperlipidemia Father    Sleep apnea Father    Early death Sister        Aneroxia/Bulimic    Depression Brother    Early death Hydrographic surveyor Accident   Stroke Maternal Grandmother    Hypertension Maternal Grandmother    Arthritis Maternal Grandfather    Heart attack Maternal Grandfather    Hearing loss Maternal Grandfather    Depression Daughter    Drug abuse Daughter    Heart disease Daughter    Hypertension Daughter    Colon cancer Neg Hx    Esophageal cancer Neg Hx    Rectal cancer Neg Hx    Past Surgical History:  Procedure Laterality Date   BALLOON DILATION  05/16/2012   Procedure: BALLOON DILATION;  Surgeon: Louis Meckel, MD;  Location: [facility] ENDOSCOPY;  Service: Endoscopy;  Laterality: N/A;   BUNIONECTOMY  2011   COLONOSCOPY     ENTEROSCOPY  05/16/2012   Procedure: ENTEROSCOPY;  Surgeon: Louis Meckel, MD;  Location: [facility] ENDOSCOPY;  Service: Endoscopy;  Laterality: N/A;   JOINT REPLACEMENT     right shoulder durgery 25 yrs ago  1988   TOTAL HIP ARTHROPLASTY Bilateral 2006, 2008   bilateral   TUBAL LIGATION  1990   WEDGE RESECTION  2002   lung cancer   Social History   Occupational History   Occupation: retired  Tobacco Use   Smoking status: Never   Smokeless tobacco: Never  Vaping Use   Vaping status: Never Used  Substance and Sexual Activity   Alcohol use: Not Currently    Alcohol/week: 1.0 standard drink of alcohol    Types: 1 Glasses of wine per week    Comment: 1 glass wine q few weeks   Drug use: No   Sexual activity: Yes

## 2023-10-03 NOTE — Progress Notes (Unsigned)
Mid back but radiates to the lower back.  No numbness / tingling.  No injury.  Pain in lower back for 5 or 6 months getting worse within the last month and a half.

## 2023-10-06 ENCOUNTER — Encounter: Payer: Self-pay | Admitting: Internal Medicine

## 2023-10-07 ENCOUNTER — Other Ambulatory Visit: Payer: Self-pay | Admitting: Psychiatry

## 2023-10-07 DIAGNOSIS — F3132 Bipolar disorder, current episode depressed, moderate: Secondary | ICD-10-CM

## 2023-10-07 DIAGNOSIS — F411 Generalized anxiety disorder: Secondary | ICD-10-CM

## 2023-10-09 ENCOUNTER — Other Ambulatory Visit: Payer: Self-pay | Admitting: Psychiatry

## 2023-10-09 NOTE — Telephone Encounter (Signed)
Pt has appt. Tomorrow.

## 2023-10-10 ENCOUNTER — Ambulatory Visit (INDEPENDENT_AMBULATORY_CARE_PROVIDER_SITE_OTHER): Payer: PPO | Admitting: Psychiatry

## 2023-10-10 ENCOUNTER — Encounter: Payer: Self-pay | Admitting: Psychiatry

## 2023-10-10 DIAGNOSIS — F900 Attention-deficit hyperactivity disorder, predominantly inattentive type: Secondary | ICD-10-CM

## 2023-10-10 DIAGNOSIS — F3132 Bipolar disorder, current episode depressed, moderate: Secondary | ICD-10-CM

## 2023-10-10 DIAGNOSIS — F411 Generalized anxiety disorder: Secondary | ICD-10-CM | POA: Diagnosis not present

## 2023-10-10 DIAGNOSIS — G3184 Mild cognitive impairment, so stated: Secondary | ICD-10-CM

## 2023-10-10 DIAGNOSIS — R7989 Other specified abnormal findings of blood chemistry: Secondary | ICD-10-CM

## 2023-10-10 DIAGNOSIS — Z79899 Other long term (current) drug therapy: Secondary | ICD-10-CM | POA: Diagnosis not present

## 2023-10-10 DIAGNOSIS — R251 Tremor, unspecified: Secondary | ICD-10-CM | POA: Diagnosis not present

## 2023-10-10 DIAGNOSIS — F5105 Insomnia due to other mental disorder: Secondary | ICD-10-CM | POA: Diagnosis not present

## 2023-10-10 DIAGNOSIS — Z636 Dependent relative needing care at home: Secondary | ICD-10-CM | POA: Diagnosis not present

## 2023-10-10 DIAGNOSIS — G2401 Drug induced subacute dyskinesia: Secondary | ICD-10-CM

## 2023-10-10 DIAGNOSIS — F314 Bipolar disorder, current episode depressed, severe, without psychotic features: Secondary | ICD-10-CM

## 2023-10-10 MED ORDER — LAMOTRIGINE 100 MG PO TABS
100.0000 mg | ORAL_TABLET | Freq: Two times a day (BID) | ORAL | 0 refills | Status: DC
Start: 2023-10-10 — End: 2024-01-30

## 2023-10-10 MED ORDER — DEXMETHYLPHENIDATE HCL 10 MG PO TABS: 20.0000 mg | ORAL_TABLET | Freq: Two times a day (BID) | ORAL | 0 refills | Status: DC

## 2023-10-10 MED ORDER — CLOZAPINE 50 MG PO TABS
ORAL_TABLET | ORAL | 3 refills | Status: DC
Start: 2023-10-10 — End: 2023-11-09

## 2023-10-10 NOTE — Progress Notes (Signed)
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 Equetro  200, CBZ 100, Lamotrigine  200 BID, lithium  150 HS, clonidine  0.1 mg 1/2 in AM and 1 at night, Librium  10 HS. SE a alittle dizzy. SE: Still having mouth movements and biting tongue.  Sometimes hard to talk.  Drooling.  When tries to increase clozapine  slurred speech and severe dizziness.   Mood is a little better.   Sleeping 8-9 hours.  So much better sleep with clozapine .   Still has anxiety, generalized. Plan: To minimize polypharmacy and improve tolerabilty:  Reduce Equetro  to 1 of the 100 mg capsule at night for 1 week, then stop it. Wait 1 week then stop the carbamazepine  chewable. Wait 1 more week then reduce clonidine  to 1/2 at night for 1 week then stop it. Plan: Started clozapine   and continue 25 mg for now bc hasn't tolerated more so far.  02/08/22 appt noted: Mouth movements and biting tongue.  Sores on cheek with constant chewing movements. Sometimes hard to talk. Hypersalivation gets worse as day progresses. Sometimes balance problems.  Constipation managed.   Sleep very well.  8-9 hours. Off Equetro  and on clozapine . Still has depression and anxiety without much change Plan: Started clozapine   and continue 25 mg for now bc hasn't tolerated more and need to start Ingrezza  40 mg for  TD.  03/23/2022 appointment with the following noted: Several phone calls since here.  Has gotten up to clozapine  37.5 mg nightly. Continues Focalin  15 mg twice daily, lamotrigine  200 mg twice daily, lithium  150 nightly. Started Ingrezza  40 mg daily. Ingrezza  amazing difference but even with grant of $10000 can't afford it.  Not biting mouth and mouth less sore.  Less mouth movements but not gone Emotionally not real well with anxiety and depression and crying spells.  Also some irritability and anger.  Easily triggered anger. Sleep more broken with Ingrezza  HS but 8-9 hours. Can be sedated if gets up early with slurred speech but not if full night sleep. Balance better off Equetro . Plan: Started clozapine  but needs to increase bc minimal effect and better tolerance, so increase to 50 mg HS  04/05/22 appt noted: Increased clozapine  to 50 mg HS.  Some groggy in the AM.  One day was dizzy.  Otherwise on occasion.  Takes it right before bed.   Still depression, hopeless, irritability and anger.  Some periods of racing thoughts.  Sometimes recognizes hypomanic episodes and somethimes doesn't recognize.  Dog is very sick and H with bone CA.  Not crying on clozapine  as much. GERD and needs surgery for hiatal hernia. Signed up for water aerobics. Plan: Started clozapine  but needs to increase bc minimal effect and better tolerance, so increase to 75 mg HS (Started clozapine  on 12/07/21) Reduce librium  5 mg HS and plan to stop  05/10/22 appt med: TD partially better with Ingrezza  40.  Has  a grant.   Increased clozapine  75 mg HS, reduced Librium  to 5 mg HS. Tolerated OK. Depression a little better.  Irritability still high.  Poor memory and easily confused. Sleep is pretty good and is better and needs to sleep longer.   Plan: DC librium  Worsening TD partial response on 40 mg Ingrezza , increase to 60 mg daily   Continue clozapine  100 mg HS  05/17/2022 phone call complaining of sleeping more and feeling  sleepy and foggy thinking also dropping some things and drooling.  She was instructed to reduce the clozapine  to 75 mg nightly to see if that was the problem. 05/23/2022 phone call asking to increase Ingrezza  from 60 to 80 mg daily.  It was agreed. 06/16/2022 phone call stating she had a bad manic episode the week prior and is still feeling excessively sedated.  Also having hand tremors. Instructed to stop lithium  and continue clozapine  75 mg nightly.  She is very med sensitive but we have few options left to treat her unstable bipolar disorder. 06/21/2022 phone call: After complaining of excessive sleepiness and excessive sleeping she is now complaining of insomnia.  07/07/22 appt noted: Current psychiatric medications include clozapine  75 mg nightly, Focalin  15 mg twice daily, lamotrigine  100 mg twice daily Ingrezza  80 mg daily.  She stopped lithium  Has a list of concerns: SE drooling bad.   Still have mouth movements with the increase Ingrezza  80 mg daily but has stopped the tongue chewing. Goes to bed 9 PM and to sleep in 30 mins and awaken in the AM about 830 and hard to wake up.  Not napping in the day.  Getting enough sleep.   Notices Focalin  kicking in when takes it. Mood depressed but not as bad.  Still some irritability, anger.  3 week ago bad manic anger lasting 3-4 days.  Not sure how her sleep was at the time. Some crying and poor impulse control.   PCP wanted 2nd opinion from neuro on ? PD, Dr. Evonnie Nov 15. Plan: No med changes pending neurologic appointment  07/20/2022 neurology appointment Dr. Asberry Tat.  Diagnosed TD.  Assessment as follows: 1.  Tardive dyskinesia -The patients symptoms are most consistent with tardive dyskinesia.  She has had exposure to typical and atypical antipsychotic medication.  TD is a heterogeneous syndrome depending on a subtle balance between several neurotransmitters in the brain, including DA receptor blockade and hypersensitivity of DA and GABA  receptors. -pt on ingrezza  since 02/2022 and both she and notes from Dr. Geoffry indicate great benefit.  2.  Tremor             -Largely resolved off of lithium  and vraylar  (vraylar  d/c in 12/2021)             -She has very minor left hand tremor.  Did tell her that Ingrezza  can occasionally cause parkinsonism, but I did not see a significant degree of that today.             -I did reassure her today that I saw no evidence of idiopathic Parkinson's  disease.  She was reassured.  3.  Bipolar d/o             -difficult to control per records             -sees Dr. Geoffry frequently  4.  Discussed with patient that she really does not need neurologic follow-up at this point in time.  She was happy to hear this.  08/08/2022 appointment noted: No med changes. Still having a lot of anxiety and worries too much. Worse than depression.  No mania since here.  No sig avoidance.   Hard time concentration. Still having irritability and anger. SE drowsy with clozapine  in AM and hard to function until about 10 AM.  Takes it about 8 PM and then goes to bed.   No falling but is shuffling more since here.   Sleep 10 hours.    09/07/22 appt noted:   Consistently on clozapine  75 mg HS.  Too drowsy if takes 25 mg in AM. Doing so so .  A lot of anxiety, anger, irritation.   Avg 8-9 hours of sleep and pushes herself to get up .  Hard to function in am until 11 or 12 noon. Ingreza 40 mg BID still some mouth movements and biting tongue.  Is better with Ingrezza  but not gone.   SE consitpation and drowsy.   She is aware of the difficulty finding balance between aenough med to manage her sx and not so much to cause intolerable SE.  Disc dosing of her meds. Plan: Augment clozapine  with fluvoxamine  25 mg HS  10/11/22 appt noted: : Current psych meds: Clozapine  75 mg nightly, Focalin  15 mg twice daily, fluvoxamine  25 mg nightly, lamotrigine  100 mg twice daily, Ingrezza  40 mg twice daily No noticeable change with  fluvoxamine . Drooling worse in am and stupor until about 11 AM.   Mood still not good , angry and irritable a lot.  H acuses he rof going off her meds.  Less mouth movements and biting tonue. Sleep 9-10 hours. Anxiety still through the roof. Tremor better right now unless weak.   H prostate CA with bone mets and on pain meds. Plan: Augment clozapine  with fluvoxamine  but increase 50mg  HS also to help anxiety, irritability  11/09/2022 appointment noted: Added fluvoxamine  50 mg HS.  And is less anxious and more grounded.  Half as irritable as before fluvoxamine .   Down side is shuffling steps seem worse.  Not dizzy usually but balance isn't good.  Gets better after the day progresses.    Overall does feel improved.   Trembling better and mouth movements much better but drooling is bad nothing seems to help that. Drools in public is embarrassing. Tired a lot better as day progresses.   Plan: Augment clozapine  with fluvoxamine  but REDUCE TO 37.5 mg mg HS bc more shuffling of feet .  And reduced clozapine  to 50 mg daily.  But did help anxiety, irritability Ingrezza  for TD helped stop tongue chewing but still some mouth movements at 80 mg, which was started 05/23/22.  Shuffling a little more and can't reduce Ingrezza .  Split ingrezza  40 BID  12/12/22 appt noted: Made changes as above.  Asks about increasing Focalin  20 BID bc lack of energy and motivation and productivity.  No SE.   No jittery,  HA. Usally sleep Is good but not always. Still shuffling if gets up at night or before morning Focalin .  Then it clears up.  No change in shuffling since here  last visit.  Other day used H's walker.  Like losing balance.   Mouth movements still there but not nearly as bad.  Worse if forgets a dose of Ingrezza .   Mood depressed and more anxious than last visit.  Constantly obsessive thinking about things that could go wrong.  More short tempered.  Impaitent at home only. H got bad report from onc that current  chemo not workingand it is changed with limited success rate.  This affects her mood too.   No change in sleep. Plan: Plan: First 2 weeks reduce Ingrezza  to 60 mg at night to see if shuffling is better with samples. If shuffling is better call office for change in RX If so then increase clozapine  back to 3 of the 25 mg capsules and fluvoxamine  back to 50 mg .  12/19/22 TC with nurse: Megan Whitney GRADE, LPN   TS   5/84/75 11:16 AM Note Pt was in to see her therapist, Marval Bunde today and asked to speak with nurse. She reports having apt with Dr. Geoffry last week on April 8th, and he reduced her Ingrezza  to 60 mg, she feels that the issue isn't coming from that but the Fluvoxamine  that she was put on a few months back. She reports she may not have explained herself well enough at the apt. She reports when she wakes up during the night she has to shuffle and hold on to the wall to keep from falling. She reports her husband has cancer and she needs to be more alert and able to function in case of emergency with him.  She reports nothing changed with the decrease in that medication.    Informed her I would discuss with Dr. Geoffry and get back with her.     12/19/22 MD resp:  Megan Whitney.  Reduce fluvoxamine  from 1 and 1/2 tablets at night to 1/2 tablet twice daily.  Split dose to reduce SE      01/09/23 appt noted: Meds: clozapine  50 mg HS, reduced fluvoxamine  12.5 mg Am, forcalin 15 BID, Ingrezza  40 BID, lamotrigine  100 BID,  Not doing well.  Dep and high anxiety.  Some irritability.  Mood swings not dramatic.  No SI. Balance issues when up in middle of night.  Does better once takes morning meds with focalin .   Sometimes forgets afternoon Focalin . Mouth movements still there but better.  Drooling stopped. A lot of things seem like too much working.  Still some wiggling toes and may chew side of mouth , not severe. Plan: Plan: DC fluvoxamine  Trial Viibryd  5 mg daily for 1 week then 10 mg  daily.  02/06/23 appt noted: Meds:  viibryd  10 HS, clozapine  50 mg HS, off  fluvoxamine ,  focalin  15 BID, Ingrezza  40 BID, lamotrigine  100 BID,  Walking better at night wihout fluvoxamine  and less dep but mood swings and anxiety through the roof.   Seems higher than last time.  Worry about everything.   Reduced appetite.  Some am nausea.   Upset with H's drinking.  He has terminal CA, prostate stage 4 mets to bones.  Hared living with someone terminal and also alcoholic.  Living with a lot of stress.   Less falling.   Mouth movements seem worse.  No drooling. Plan: increase Clozapine  62.5 mg HS for a week for mood swings and anxity and if fails but no SE then increase to 75 mg HS. Split ingrezza  but increase bc TD worse lately to 60 mg BID  03/14/23  appt Noted: Increased clozapine  to 75 mg HS. No SE except sedating at night.   Anxiety is still high but dep seems better.  Worry over H's CA and hosp last weekend.  Lots of medical bills.  General worry mostly about H's CA and alcoholism. Couple mood swings and racing obsessive thinking briefly. Sleeping well.  No SI Loss of appetite.   Plan: Increase to  Viibryd  15 mg daily for anxiety.   04/10/23 appt noted: Psych meds:  viibryd  incr to 20 HS 2 weeks, clozapine  75 mg HS,  focalin  usually 20 AM, Ingrezza  60 BID, lamotrigine  100 BID,  Noticed improvement in dep with incr Viibryd  20 mg without much change in anxiety.  Stress level is still high also.  H health getting worse and still drinking and fears having to call EMT bc of this. Taking Viibryd  with food. Still some dizziness but no falls. Sleep is good.  No major mood swings. Still in counseling  and working on anxiety and low confidence. Worries over having to take care of finances.   Some racing thoughts in brief spells. Wants better cotrol of anxiety with constant worry and still irritable. Plan: For anxiety and irritability, reactivity incr clozapine  to 100 mg HS  05/09/23 appt noted: Psych  med: Clozapine  100 nightly, Focalin  15 twice daily, lamotrigine  100 twice daily, Ingrezza  60 mg twice daily, Viibryd  20 mg daily for 6 weeks. SE still shuffles at night bc afraid she will fall.  No worse.  No shuffling daytime. On occ taking Focalin  20 mg BID to help mood as primary benefit. Racing thoughts not often but not gone. Still a lot of worry.  Still has irritability. Some dep also but not as bad. Gets up one or two times nightly.   Plan: Increase Viibryd  30-then 40 mg daily for anxiety and depression.  06/08/23 appt noted: Psych meds as above;  Viibryd  40 mg. No SE with it as far as she knows She feels like mood has been more stable.  Still episodes of plunging but less often and briefer.  Still some bipolar moments doing things impulsively that later she regrets.   Anxiety still high but not as bad. Still shuffles at night only bc fear of losing balance but not a problem during the day.   Racing thoughts are better.   Sleep 8-10 and not drowsy.   Still some mouth puckering but better with Ingrezza .  Wonders if it will be covered after [facility]  07/10/23 apppt noted. Psych meds: Clozapine  100 @ 8 pm, Focalin  15 twice daily forgets 2nd dose, lamotrigine  100 twice daily, Ingrezza  60 twice daily, Viibryd  40 Not well.  10 day ago manic rage, yelling , cursing , slamming doors.  Lasted about an hour.  Totally out of control.  Last night H said something triggering anger and she was aggressive in speech.  Still seem to be angry and irritable.  Also racing thoughts random but chronic anxiety over finances.   With new [employer] advantage plan.  Looking into coverage for Ingrezza .  Concerned she might not be able to afford it.   Shuffles feet and balance problems when gets up and down.  Gets better when takes focalin  I the morning.   Also having some depression    To bed 8 pm.   Plan: For anxiety and irritability, reactivity incr clozapine  to 100 mg HS and 25 mg AM    08/07/23 appt noted: No further  manic anger spells.  Some obsessive thinking and  dep.Obs thoughts can be about any worries; like H's health.  H alcoholic and met prostate cancer and other problems.  $ concerns and dog sick.   Palliative care nurse found out his was drinking and she had RX Oxycontin  and then she stopped RX.  He prays for death DT pain.  Thinks she would fall to pieces if he died.  H on chemo.   No falls but shuffles at night.   No napping.   Changing insurance first of year and will need PA of Ingrezza .  Don't think they will cover Focalin .   Psych meds:  clozapine  25 AM and 100 PM, Focalin  15 mg BID, lamotrigine  100 BID, Ingrezza  60 BID, Viibryd  40. ER visit with palp but none further and EKG unremarkable.   Hard time going to sleep.   So much on my mind.   To bed 8-830pm.   Sleep 10-11 hours usually. Plan INCREASE CLOZAPINE  50 MG am AND 100 MG HS  09/11/23 appt noted: Psych meds: clozapine  50 AM and 100 PM, Focalin  15 mg BID, lamotrigine  100 BID, Ingrezza  60 BID, Viibryd  40. Dep, no motivation, stutter understress fairly new.   Some racing thoughts and obs over things seems worse.  Mainly worry about normal things.  Will obsess when goes to store that she might lose something like her keys.  That has been going on for awhile.   Getting to bed late but good sleep when falls asleep.   In the morning still shuffles but once takes morning meds it is better.  Probably from the Focalin .  Talked to [facility] at [place] but forgot to do what she said and ingrezza  denied. Generic Focalin  is covered.    No change in H's health.  Palliative care nurse involved.   She thinks anxiety and depression are sky high but dep probably worse.   Still has some mouth movements and grinding teeth. Plan: Increase  Focalin  IR 20 mg BID  for ADD and off label for depression   10/10/23 appt noted: Psych meds: clozapine  50 AM and 100 PM,  lamotrigine  100 BID, Ingrezza  60 BID, Viibryd  40. Increase Focalin  20 mg BID, Been depressed, super  anxious and confused.  She doesn't think it was Focalin .   Depression is pretty bad.  Not sure of triggers except living with person who's alcoholic and has CA.  Seeing oncologist.  Has palliative care doctor.  He only sits and watches podcasts.  She feels overwhelmed bc he can't do anything around the house.   He hass a lot of pain interfering with function.  Previously would do coooking, cleaning, bills,.  She's afraid of making a mistake.  Her confusion is gernalized.  Hard to make decisions. Thinks incr Focalin  did helpf dep some.  Would not want to reduce it back to where it was. Would like to try it longer.   Not sleepy from clozapine .  But still shuffles and balance issues at night ? Related to clozapine  vs something else. No new health problems.  Palliative care nurse monthly.   He can do his own ADLs unless drinks too much.   No family or church visits.   She still goes to church. No falls lately.  His PSA is going up.   Taking oral chemo.  Past Psychiatric Medication Trials: Vraylar  4.5 SE mouth movements reduced to 3 mg 3/20. It was effective at lower doses for depression.  Worse off it.  Vraylar  1.5 mg every third day led to relapse of  significant depression. [place] SE at 42 mg .  Cost problems Latuda 80, , olanzapine, Seroquel, risperidone, Abilify , loxapine  25 mg BID (max tolerated) NR, Clozapine  100  Ingrezza  60 BID  helps No Austedo   lithium  150 tremor   Trileptal  450, Depakote, Equetro  300 hx balance issues, CBZ ER falling,   Lamictal  200 twice daily,  Focalin  15 BID,  Ritalin ,   fluoxetine  60,  sertraline 100, Wellbutrin history of facial tics, paroxetine cognitive side effects, Lexapro  10 worse Fluvoxamine  50  buspirone,   ropinirole, amantadine , Sinemet, Artane, Cogentin,  pramipexole  0.5 mg BID helped tremor but caused anger trazodone hangover, Ambien hangover,  Review of Systems:  Review of Systems  HENT:  Positive for dental problem and tinnitus.         Chirping cricket sounds in hears since January 2023 drooling  Respiratory:  Negative for cough.   Cardiovascular:  Negative for chest pain.  Gastrointestinal:  Negative for abdominal pain and nausea.       GERD awakening her  Musculoskeletal:  Positive for arthralgias and gait problem.  Neurological:  Positive for dizziness and tremors. Negative for weakness.       Ankle problems and balance problems. No falls lately. Occ stumbles. Tremor is better in hands Mouth movements, mild lip licking.    Psychiatric/Behavioral:  Positive for dysphoric mood. Negative for agitation, behavioral problems, confusion, decreased concentration, hallucinations, self-injury, sleep disturbance and suicidal ideas. The patient is nervous/anxious. The patient is not hyperactive.   No falls since here. Not currently depressed but unable to remove this from the list.  Medications: I have reviewed the patient's current medications.  Current Outpatient Medications  Medication Sig Dispense Refill   acetaminophen  (TYLENOL ) 650 MG CR tablet Take 1,300 mg by mouth as needed for pain.     atorvastatin  (LIPITOR) 20 MG tablet Take 1 tablet (20 mg total) by mouth every evening. 90 tablet 3   Ferrous Gluconate  (IRON  27 PO) Take by mouth.     INGREZZA  60 MG capsule TAKE 1 CAPSULE BY MOUTH TWICE A DAY 60 capsule 1   ketoconazole  (NIZORAL ) 2 % cream Apply 1 Application topically daily. 60 g 0   Melatonin 10 MG CAPS Take by mouth at bedtime as needed.     pantoprazole  (PROTONIX ) 40 MG tablet TAKE (1) TABLET BY MOUTH TWICE DAILY. 180 tablet 1   promethazine -dextromethorphan (PROMETHAZINE -DM) 6.25-15 MG/5ML syrup Take 5 mLs by mouth 4 (four) times daily as needed. 100 mL 0   Vilazodone  HCl (VIIBRYD ) 40 MG TABS Take 1 tablet (40 mg total) by mouth daily. 90 tablet 0   cloZAPine  (CLOZARIL ) 50 MG tablet 1 tablet in the AM and 2 tablets at night 90 tablet 3   dexmethylphenidate  (FOCALIN ) 10 MG tablet Take 2 tablets (20 mg total) by  mouth 2 (two) times daily. 120 tablet 0   lamoTRIgine  (LAMICTAL ) 100 MG tablet Take 1 tablet (100 mg total) by mouth 2 (two) times daily. 180 tablet 0   No current facility-administered medications for this visit.    Medication Side Effects: Other: tremor and weight gain.   Dyskinesia appears better  SE bettter than they were.  Balance problems intermittently  Allergies:  Allergies  Allergen Reactions   Azithromycin Anaphylaxis   Penicillins Anaphylaxis    DID THE REACTION INVOLVE: Swelling of the face/tongue/throat, SOB, or low BP? Yes Sudden or severe rash/hives, skin peeling, or the inside of the mouth or nose? Yes Did it require medical treatment? No When did it last  happen?       If all above answers are NO, may proceed with cephalosporin use.  Patient reacts to Z pack.  HAS Taken amoxicillin  fine.   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    Esophageal stricture    Fracture of superior pubic ramus (HCC) 11/28/2018   GERD (gastroesophageal reflux disease)    Headache(784.0)    Hyperlipidemia    Interstitial cystitis    Joint pain    Lactose intolerance    Lung cancer (HCC) 2002   Neuromuscular disorder (HCC)    Obesity    Osteoarthritis    Palpitations    Sleep apnea    Doesn't use a CPAP   Suicidal ideation 01/20/2020   Swallowing difficulty    Tardive dyskinesia     Family History  Problem Relation Age of Onset   Arthritis Mother    Hearing loss Mother    Hyperlipidemia Mother    Hypertension Mother    Depression Mother    Anxiety disorder  Mother    Obesity Mother    Sudden death Mother    Hypertension Father    Diabetes Mellitus II Father    Heart disease Father    Arthritis Father    Cancer Father        Brain   COPD Father    Diabetes Father    Hyperlipidemia Father    Sleep apnea Father    Early death Sister        Aneroxia/Bulimic   Depression Brother    Early death [employer] Accident   Stroke Maternal Grandmother    Hypertension Maternal Grandmother    Arthritis Maternal Grandfather    Heart attack Maternal Grandfather    Hearing loss Maternal Grandfather    Depression Daughter    Drug abuse Daughter    Heart disease Daughter    Hypertension Daughter    Colon cancer Neg Hx    Esophageal cancer Neg Hx    Rectal cancer Neg Hx     Social History   Socioeconomic History   Marital status: Married    Spouse name: Not on file   Number of children: 1   Years of education: 12   Highest education level: Not on file  Occupational History   Occupation: retired  Tobacco Use   Smoking status: Never   Smokeless tobacco: Never  Vaping Use   Vaping status: Never Used  Substance and Sexual Activity   Alcohol use: Not Currently    Alcohol/week: 1.0 standard drink of alcohol    Types: 1 Glasses of wine per week    Comment: 1 glass wine q few weeks   Drug use: No   Sexual activity: Yes  Other Topics Concern   Not on file  Social History Narrative   Pt lives in [place] with husband Francis.  Followed by Dr. Geoffry for  psychiatry and Marval Bunde for therapy.   Right handed   Drinks caffeine   One story home   Married lives with husband   retired   Chief Executive Officer Drivers of Corporate Investment Banker Strain: Low Risk  (06/01/2023)   Overall Financial Resource Strain (CARDIA)    Difficulty of Paying Living Expenses: Not hard at all  Food Insecurity: No Food Insecurity (06/22/2023)   Hunger Vital Sign    Worried About Running Out of Food in the Last Year: Never true    Ran Out of Food in the Last  Year: Never true  Transportation Needs: No Transportation Needs (06/22/2023)   PRAPARE - Administrator, Civil Service (Medical): No    Lack of Transportation (Non-Medical): No  Physical Activity: Inactive (06/01/2023)   Exercise Vital Sign    Days of Exercise per Week: 0 days    Minutes of Exercise per Session: 0 min  Stress: Stress Concern Present (06/01/2023)   [employer] of Occupational Health - Occupational Stress Questionnaire    Feeling of Stress : Very much  Social Connections: Socially Integrated (06/01/2023)   Social Connection and Isolation Panel [NHANES]    Frequency of Communication with Friends and Family: More than three times a week    Frequency of Social Gatherings with Friends and Family: More than three times a week    Attends Religious Services: More than 4 times per year    Active Member of [employer] or Organizations: Yes    Attends Banker Meetings: More than 4 times per year    Marital Status: Married  Catering Manager Violence: Not At Risk (06/01/2023)   Humiliation, Afraid, Rape, and Kick questionnaire    Fear of Current or Ex-Partner: No    Emotionally Abused: No    Physically Abused: No    Sexually Abused: No    Past Medical History, Surgical history, Social history, and Family history were reviewed and updated as appropriate.   Please see review of systems for further details on the patient's review from today.   Objective:   Physical Exam:  There were no vitals taken for this visit.  Physical Exam Neurological:     Mental Status: She is alert and oriented to person, place, and time.     Cranial Nerves: No dysarthria.     Gait: Gait normal.     Comments: Lip licking consistent, no worse Good gait  Psychiatric:        Attention and Perception: Attention and perception normal.        Mood and Affect: Mood is anxious and depressed.        Speech: Speech normal.        Behavior: Behavior is cooperative.        Thought  Content: Thought content normal. Thought content is not paranoid or delusional. Thought content does not include homicidal or suicidal ideation. Thought content does not include suicidal plan.        Cognition and Memory: Cognition and memory normal.        Judgment: Judgment normal.     Comments: Insight intact Ongoing residual dep, irritability, anxiety with dep worst     Lab Review:     Component Value Date/Time   NA 139 08/02/2023 1214   NA 142 03/22/2023 0916   K 4.2 08/02/2023 1214   CL 106 08/02/2023 1214   CO2 26 08/02/2023 1214   GLUCOSE 101 (H) 08/02/2023 1214   BUN 17 08/02/2023 1214  BUN 12 03/22/2023 0916   CREATININE 0.85 08/02/2023 1214   CALCIUM  9.8 08/02/2023 1214   PROT 7.2 03/22/2023 0916   ALBUMIN 4.5 03/22/2023 0916   AST 27 03/22/2023 0916   ALT 14 03/22/2023 0916   ALKPHOS 68 03/22/2023 0916   BILITOT 0.7 03/22/2023 0916   GFRNONAA >60 08/02/2023 1214   GFRAA 96 03/02/2020 1433       Component Value Date/Time   WBC 8.0 08/02/2023 1214   RBC 4.29 08/02/2023 1214   HGB 13.2 08/02/2023 1214   HGB 14.8 03/22/2023 0916   HCT 39.8 08/02/2023 1214   HCT 47.3 (H) 03/22/2023 0916   PLT 188 08/02/2023 1214   PLT 225 03/22/2023 0916   MCV 92.8 08/02/2023 1214   MCV 92 03/22/2023 0916   MCH 30.8 08/02/2023 1214   MCHC 33.2 08/02/2023 1214   RDW 14.8 08/02/2023 1214   RDW 13.8 03/22/2023 0916   LYMPHSABS 3.4 (H) 03/22/2023 0916   MONOABS 0.7 04/04/2019 1004   EOSABS 0.2 03/22/2023 0916   BASOSABS 0.0 03/22/2023 0916  Vitamin D  level 33 on 10K units daily on 12/4/`9 Increased to prescription vitamin d  50K units Monday, Wed, Friday.  Rx sent in.   Lithium  Lvl  Date Value Ref Range Status  10/21/2018 0.18 (L) 0.60 - 1.20 mmol/L Final    Comment:    Performed at [facility], [address] [place], [place] [address]     No results found for: PHENYTOIN, PHENOBARB, VALPROATE, CBMZ   .res Assessment: Plan:    Bipolar disorder with  moderate depression (HCC) - Plan: cloZAPine  (CLOZARIL ) 50 MG tablet, lamoTRIgine  (LAMICTAL ) 100 MG tablet  Generalized anxiety disorder  Attention deficit hyperactivity disorder (ADHD), predominantly inattentive type  Insomnia due to mental condition  Tardive dyskinesia  Mild cognitive impairment  Long term current use of clozapine   Low vitamin D  level  Tremor of both hands  Caregiver stress  Bipolar affective disorder, depressed, severe (HCC) - Plan: dexmethylphenidate  (FOCALIN ) 10 MG tablet  30 min face to face time with patient was spent on counseling and coordination of care. We discussed multiple dxes and concerns.   Kynlei has chronic rapid cycling bipolar disorder which is chronically unstable and has been difficult to control.  Failed 14 different mood stabilizers.  The rapid cycling is making it difficult to control frequency of depressive episodes and the anxiety as well.  We have typically had to make frequent med changes.   Disc gradual increase bc med sensitivity.  Med sensitivity to SE seems to be the biggest problem preventing better mood control  Disc options for better control of dep, irritability and anxiety.  Incr Viibryd  vs clozapine   Ingrezza  for TD helped stop tongue chewing but still some mouth movements at 80 mg, which was started 05/23/22.  Shuffling only at night now.  Drooling resolved  Split ingrezza  but increase bc TD worse lately to 60 mg BID Consider Austedo .  Disc cost concerns  .  Getting grant but that might end.  Need to pick insurance that will cover it.  Disc Coverage app.  Disc ECT. Only FDA approved option left. She wants to defer.    Extensive discussion of clozapine  dosing recommendations but we will increase more slowly bc she is so med sensitive.Disc risk low WBC, cardiomyopathy, etc, sedation  (Started clozapine  on 12/07/21).    Check CBC with diff every 4 weeks.  Now.  Option increase clozapine  for anxiety.  Disc SR risk.   For anxiety  and irritability, reactivity , mood swings continue clozapine  to 100 mg HS and 50 mg AM   Likely to work better than incr Viibryd  but consider latter.  She has a high residual anxiety.  It has been impossible to control all of her symptoms simultaneously without causing side effects. Failed various meds.  Discussed side effects of each medicine. Increase  Focalin  IR 20 mg BID  for ADD and off label for depression  Try to spread this out if possible for mood.  She feels this is necessary  .  She is remembering both doses.   Discussed potential benefits, risks, and side effects of stimulants with patient to include increased heart rate, palpitations, insomnia, increased anxiety, increased irritability, or decreased appetite.  Instructed patient to contact office if experiencing any significant tolerability issues. Consider modafinil  if necessary  Would like to avoid BZ if possible.  Lamotrigine  100 mg twice daily to try to reduce polypharmacy and so improve tolerability of clozapine .  consider reduction if clozapine  helps.  Failed all reasonable alternatives for anxiety.  Option Auvelity.  She is under a lot of genuine stress.  Discussed potential metabolic side effects associated with atypical antipsychotics, as well as potential risk for movement side effects. Advised pt to contact office if movement side effects occur.    Checked B12 folate bc memory complaints.  Normal B12 & folate on 05/25/22  Disc SE meds and this is heightened by the complication of necessary polypharmacy.  Counseling 20 min: Supportive  and cognitive behavioral therapy in terms of dealing with husband's addiction and now dx metastatic prostate CA..  breaking down tasks into manageable parts.  Grief.  Seeking contact withothers at church and taking advantates of outside resources.  Rec exercise as it would help.  Take advantage of family support  Continue Viibryd  40 mg daily for anxiety and depression.  It helped  some Take with food.   and usually with dinner.  Requires frequent FU DT chronic instability.  Wants to schedule monthly.  FU 4 weeks.   Lorene Macintosh MD, DFAPA  Please see After Visit Summary for patient specific instructions.    Future Appointments  Date Time Provider Department Center  10/16/2023 10:00 AM Sherlynn Sober, LCSW [facility] None  10/24/2023  2:30 PM [facility] PREVISIT RM 50 [facility]  10/30/2023 10:00 AM Sherlynn Sober, LCSW [facility] None  11/07/2023 12:30 PM Abran Norleen SAILOR, MD [facility]  11/09/2023 10:30 AM Cottle, Lorene KANDICE Raddle., MD [facility] None  11/13/2023 10:00 AM Sherlynn Sober, LCSW [facility] None  11/27/2023 10:00 AM Sherlynn Sober, LCSW [facility] None  12/12/2023 10:00 AM Sherlynn Sober, LCSW [facility] None  12/14/2023 10:30 AM Cottle, Lorene KANDICE Raddle., MD [facility] None  03/27/2024  2:40 PM Tobie Suzzane POUR, MD [facility]  06/05/2024 10:40 AM R[facility]-ANNUAL WELLNESS VISIT [facility]    No orders of the defined types were placed in this encounter.      -------------------------------

## 2023-10-16 ENCOUNTER — Ambulatory Visit (INDEPENDENT_AMBULATORY_CARE_PROVIDER_SITE_OTHER): Payer: PPO | Admitting: Psychiatry

## 2023-10-16 DIAGNOSIS — F3132 Bipolar disorder, current episode depressed, moderate: Secondary | ICD-10-CM

## 2023-10-16 NOTE — Progress Notes (Signed)
 [facility] Counselor/Therapist Progress Note  Patient ID: Megan Whitney, MRN: [id number],    Date: 10/16/2023  Time Spent: 50 minutes   Treatment Type: Individual Therapy  Reported Symptoms: anxiety, "bipolar", depression "some better"     Mental Status Exam:  Appearance:   Casual     Behavior:  Appropriate, Sharing, and Motivated  Motor:  Normal but later states she's started to have some difficulty with "small manual tasks with hands"  Speech/Language:   Clear and Coherent  Affect:  Depressed and anxious  Mood:  anxious and depressed  Thought process:  goal directed  Thought content:    Rumination and obsessive thoughts  Sensory/Perceptual disturbances:    WNL  Orientation:  oriented to person, place, time/date, situation, day of week, month of year, year, and stated date of Feb. 10, 2025  Attention:  Good/Fair  Concentration:  Fair  Memory:  "Sometimes some short term memory issues", "longer term memory much better"  Fund of knowledge:   Fair  Insight:    Good and Fair  Judgment:   Good and Fair  Impulse Control:  Good and Fair   Risk Assessment: Danger to Self:  No Self-injurious Behavior: No Danger to Others: No Duty to Warn:no Physical Aggression / Violence:No  Access to Firearms a concern: No  Gang Involvement:No   Subjective: Patient in today reporting "bipolar", anxiety, "some depression but some better".Needing help "for my depression and anxiety". Anger, she reports is some better. But "I do fly off the handle at my husband with cancer, when I'm having my bipolar moments." Discussed strategies for patient to use during "my bipolar moments". Is connecting with with certain friends on a regular basis.  Worked more specifically with patient today on her depressive thoughts and her anxious thoughts, working with specific examples and trying to interrupt them realizing they are not totally based in reality, and also looking at what helps during these episodes  when she has more mood volatility.  At 1 point in the session she stated that she thought her dog was the main problem causing increased symptoms more recently.  She does have an older dog that she has had a long time who is having increased eye issues "not doing very well right now".  Some tearfulness as she talked through her fears and concerns.  It was noticeable however at the end of session she was calmer and not as obsessive.  Shared some ways that she can have more frequent contact with her friends especially a couple of her close her friends who can also help provide support to her.  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  Patient shared that she has some projects going on at home and that it would help her to spend some time doing those which helps "get my mind off of other things".  Also encouraged her journaling as she feels that might be helpful and to bring it in with her next session.  Denies any SI.  Encouraged patient to be less self judging and be more compassionate with herself especially during these times of heightened anxiety or fear.  Trying to get more adequate sleep.  Reports staying on her medications, and working to have more positive self talk along with more positive proactive behaviors as discussed in  sessions.  She feels she is managing fear better at times but it is "off and on".  Encouraged healthy limit setting for herself and remaining on her medications as prescribed.  Interventions: Cognitive Behavioral Therapy and Ego-Supportive  Long Term Goal: Reduce overall level, frequency, and intensity of the anxiety so that daily functioning is not impaired. Short Term Goal: 1.Increase understanding of the beliefs and messages that produce the worry and anxiety. Strategies: 1.Help client develop reality-based  positive cognitive messages/self-talk. 2. Develop a "coping card" or other reminder which coping strategies are recorded for patient's later use.   Diagnosis:   ICD-10-CM   1. Bipolar disorder with moderate depression (HCC)  F31.32      Plan:   Patient in appointment today and continues her work on her anxiety, obsessiveness, depression, fears, and "my bipolar issues".  At times she has seemed more intent on goal-directed behaviors and at other times she reports that is a challenge as her "ups and downs" interrupt cycles of "doing better managing things".  Did seem to leave on a more positive note today after talking through her feelings and expressing some tearfulness earlier in session.  She feels concerned at times when she is angry with husband because he does not follow medical advice, but also seem to have some compassion for herself and understanding why these feelings of anger might arise. Encouraged patient as she continues to try and refocus with her positive self talk and self affirming behaviors as noted in sessions including: Use of good judgment and making thought-through decisions versus impulsive decisions, letting friends be of support to her, refrain from self sabotaging regarding her goals of relationships with others, use of journaling between sessions which has been helpful previously, setting limits with people who are not supportive or either aggravate her situation, refrain from assuming worst-case scenarios, look for the strengths and positives within herself, engage frequently with her 2 dogs which is very therapeutic for her, and recognize the strength she shows working with goal-directed behaviors to move in a direction that supports her improved emotional/physical health, and her overall outlook into the future.  She has definitely made some points of progress and needs to work further and holding onto any progress made and continue her work with goal-directed behaviors to move  in a more hopeful and healthier direction.  Goal review and progress/challenges noted with patient.  Next appointment within 2 weeks.   [name], LCSW

## 2023-10-24 ENCOUNTER — Ambulatory Visit (AMBULATORY_SURGERY_CENTER): Payer: PPO

## 2023-10-24 VITALS — Ht 61.0 in | Wt 124.0 lb

## 2023-10-24 DIAGNOSIS — Z860100 Personal history of colon polyps, unspecified: Secondary | ICD-10-CM

## 2023-10-24 DIAGNOSIS — G709 Myoneural disorder, unspecified: Secondary | ICD-10-CM | POA: Insufficient documentation

## 2023-10-24 MED ORDER — SUFLAVE 178.7 G PO SOLR: 1.0000 | Freq: Once | ORAL | 0 refills | Status: AC

## 2023-10-24 NOTE — Progress Notes (Signed)
 No egg or soy allergy known to patient  No issues known to pt with past sedation with any surgeries or procedures Patient denies ever being told they had issues or difficulty with intubation  No FH of Malignant Hyperthermia Pt is not on diet pills Pt is not on  home 02  Pt is not on blood thinners  Pt denies issues with constipation  No A fib or A flutter Have any cardiac testing pending--no Pt can ambulate independently Pt denies use of chewing tobacco Discussed diabetic I weight loss medication holds Discussed NSAID holds Checked BMI Pt instructed to use Singlecare.com or GoodRx for a price reduction on prep  Patient's chart reviewed by Cathlyn Parsons CNRA prior to previsit and patient appropriate for the [facility].  Pre visit completed and red dot placed by patient's name on their procedure day (on provider's schedule).

## 2023-10-26 ENCOUNTER — Other Ambulatory Visit: Payer: Self-pay

## 2023-10-26 ENCOUNTER — Telehealth: Payer: Self-pay | Admitting: Internal Medicine

## 2023-10-26 DIAGNOSIS — Z860100 Personal history of colon polyps, unspecified: Secondary | ICD-10-CM

## 2023-10-26 MED ORDER — PEG 3350-KCL-NA BICARB-NACL 420 G PO SOLR: 4000.0000 mL | Freq: Once | ORAL | 0 refills | Status: AC

## 2023-10-26 NOTE — Telephone Encounter (Signed)
Rx sent for Golytely. New prep instructions sent via mychart.

## 2023-10-26 NOTE — Telephone Encounter (Signed)
Inbound call from patient stating that her insurance will not cover her prep. Patient states that her insurance will cover [place], [place] and [place]. Requesting new prep. Please advise.

## 2023-10-28 ENCOUNTER — Telehealth: Payer: Self-pay

## 2023-10-28 NOTE — Telephone Encounter (Signed)
 Prior Authorization submitted for Dexmethylphenidate 10 mg tablet with [employer]

## 2023-10-30 ENCOUNTER — Ambulatory Visit: Payer: PPO | Admitting: Psychiatry

## 2023-10-30 DIAGNOSIS — F3132 Bipolar disorder, current episode depressed, moderate: Secondary | ICD-10-CM | POA: Diagnosis not present

## 2023-10-30 NOTE — Telephone Encounter (Signed)
 Prior Approval received for Dexmethylphenidate 10 mg tablets effective 10/30/23-09/04/24 with [employer] Medicare.

## 2023-10-30 NOTE — Progress Notes (Signed)
 [facility] Counselor/Therapist Progress Note  Patient ID: Megan Whitney, MRN: [id number],    Date: 10/30/2023  Time Spent: 55 minutes   Treatment Type: Individual Therapy  Reported Symptoms: anxiety, "bipolar", depression "some better", anger (mainly with husband and not always following medical advice)   Mental Status Exam:  Appearance:   Casual, Neat, and Well Groomed     Behavior:  Appropriate, Sharing, and Motivated  Motor:  Normal  Speech/Language:   Clear and Coherent  Affect:  Anxious and some depression  Mood:  anxious, depressed, and irritable  Thought process:  goal directed  Thought content:    Rumination  Sensory/Perceptual disturbances:    WNL  Orientation:  oriented to person, place, time/date, situation, day of week, month of year, year, and stated date of Feb. 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 Patient reports most important for her to work on today is her anger, irritability, and anxiety. Feels her "anger comes from my anxiety" and frustration with my husband and some of his previous choices that have made his current situation more difficulty for staff working with him and for patient. (Not all details included in this note due to patient privacy needs.) Continues struggling with her anger and works today on that "as it is over the roof at times" and "I'm angry with him at times". Talked through some of her anger today.  "Husband fell yesterday after drinking more, and I get angry because he doesn't love me enough to give up the alcohol". Processing more of the frustration, anxiety, and depression in session today. States "I'm dealing with 3 main things at home: my bipolar, alcoholism (husband), and cancer (husband). Recognizes she is some calmer in current stressful situations than when she has been able to be in earlier situations. Practicing more "giving herself some thinking time rather than impulsively making decision or comments without thinking them through more." Looking more at what I can control and accept more of what I can't control. Is being more mindful of how she gets "triggered" and ways she can manage the triggers better. "Haven't flown off the handle recently so that's good."  Is going to lunch with 2 friends later this week, and stays in touch a few times throughout each week. Beloved dog had surgery on both eyes and is doing very well now; is a great support for patient. Continues working on her depressive and anxious thoughts and how to manage them in healthier ways.  Denies any SI.  Encouraged patient to be less self judging and more compassionate with herself especially in interacting with her husband.  Reports that she is staying on her medications as prescribed and using more positive self talk as noted in sessions.  Interventions: Cognitive Behavioral Therapy, Ego-Supportive, and Grief Therapy  Long Term Goal: Reduce overall level, frequency, and intensity of the anxiety so that daily functioning is not impaired. Short Term  Goal: 1.Increase understanding of the beliefs and messages that produce the worry and anxiety. Strategies: 1.Help client develop reality-based positive cognitive messages/self-talk. 2. Develop a "coping card" or other reminder which coping strategies are recorded for patient's later use.   Diagnosis:   ICD-10-CM   1. 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  Worked well in session today especially at interrupting cycles of negative thinking and/or behaviors.  Does feel that she is not quite as impulsive as she used to be in some situations.  Committed to treatment strategies and gives evidence of that today in session. Encouraged patient as she continues to try and refocus with her positive self talk and self affirming behaviors as noted in sessions including: Use of good judgment and making thought-through decisions versus impulsive decisions, letting friends be of support to her, refrain from self sabotaging regarding her goals of relationship with others, use of journaling between sessions which has been very helpful to her previously, setting limits with people who are not supportive for either aggravate her situation, refrain from assuming worst-case scenarios, look for the strengths and positives within herself, engage frequently with her 2 dogs which is very therapeutic for her, and realize the strength she shows working with goal-directed behaviors to move in a direction that supports her improved emotional/physical health and her overall wellbeing.  Patient has definitely made some progress and agrees that she needs to work further as she holds onto progress made and continues her work with goal-directed behaviors to keep moving and a more positive and healthier direction.  Goal review and progress/challenges noted with patient.  Next appointment within 2 weeks.   Mathis Fare, LCSW

## 2023-11-03 DIAGNOSIS — F319 Bipolar disorder, unspecified: Secondary | ICD-10-CM | POA: Diagnosis not present

## 2023-11-03 DIAGNOSIS — Z79899 Other long term (current) drug therapy: Secondary | ICD-10-CM | POA: Diagnosis not present

## 2023-11-07 ENCOUNTER — Encounter: Payer: PPO | Admitting: Internal Medicine

## 2023-11-07 ENCOUNTER — Encounter: Payer: Self-pay | Admitting: Psychiatry

## 2023-11-09 ENCOUNTER — Ambulatory Visit (INDEPENDENT_AMBULATORY_CARE_PROVIDER_SITE_OTHER): Payer: No Typology Code available for payment source | Admitting: Psychiatry

## 2023-11-09 ENCOUNTER — Encounter: Payer: Self-pay | Admitting: Psychiatry

## 2023-11-09 DIAGNOSIS — F3132 Bipolar disorder, current episode depressed, moderate: Secondary | ICD-10-CM | POA: Diagnosis not present

## 2023-11-09 DIAGNOSIS — F900 Attention-deficit hyperactivity disorder, predominantly inattentive type: Secondary | ICD-10-CM | POA: Diagnosis not present

## 2023-11-09 DIAGNOSIS — G2401 Drug induced subacute dyskinesia: Secondary | ICD-10-CM

## 2023-11-09 DIAGNOSIS — R251 Tremor, unspecified: Secondary | ICD-10-CM

## 2023-11-09 DIAGNOSIS — G3184 Mild cognitive impairment, so stated: Secondary | ICD-10-CM

## 2023-11-09 DIAGNOSIS — F411 Generalized anxiety disorder: Secondary | ICD-10-CM | POA: Diagnosis not present

## 2023-11-09 DIAGNOSIS — Z79899 Other long term (current) drug therapy: Secondary | ICD-10-CM | POA: Diagnosis not present

## 2023-11-09 DIAGNOSIS — R7989 Other specified abnormal findings of blood chemistry: Secondary | ICD-10-CM | POA: Diagnosis not present

## 2023-11-09 DIAGNOSIS — F5105 Insomnia due to other mental disorder: Secondary | ICD-10-CM | POA: Diagnosis not present

## 2023-11-09 MED ORDER — VILAZODONE HCL 40 MG PO TABS
60.0000 mg | ORAL_TABLET | Freq: Every day | ORAL | 0 refills | Status: DC
Start: 2023-11-09 — End: 2023-11-15

## 2023-11-09 MED ORDER — CLOZAPINE 25 MG PO TABS
75.0000 mg | ORAL_TABLET | Freq: Two times a day (BID) | ORAL | 1 refills | Status: AC
Start: 2023-11-09 — End: ?

## 2023-11-09 MED ORDER — DEXMETHYLPHENIDATE HCL ER 40 MG PO CP24: 1.0000 | ORAL_CAPSULE | Freq: Every morning | ORAL | 0 refills | Status: DC

## 2023-11-09 NOTE — Progress Notes (Signed)
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 then reduce clonidine to 1/2 at night for 1 week then stop it. Plan: Started clozapine  and continue 25 mg for now bc hasn't tolerated more so far.  02/08/22 appt noted: Mouth movements and biting tongue.  Sores on cheek with constant chewing movements. Sometimes hard to talk. Hypersalivation gets worse as day progresses. Sometimes balance problems.  Constipation managed.   Sleep very well.  8-9 hours. Off Equetro and on clozapine. Still has depression and anxiety without much change Plan: Started clozapine  and continue 25 mg for now bc hasn't tolerated more and need to start Ingrezza 40 mg for TD.  03/23/2022 appointment with the following noted: Several phone calls since here.  Has gotten up to clozapine 37.5 mg nightly. Continues Focalin 15 mg twice daily, lamotrigine 200 mg twice daily, lithium 150 nightly. Started Ingrezza 40 mg daily. Ingrezza amazing difference but even with grant of $10000 can't afford it.  Not biting mouth and mouth less sore.  Less mouth movements but not gone Emotionally not real well with anxiety and  depression and crying spells.  Also some irritability and anger.  Easily triggered anger. Sleep more broken with Ingrezza HS but 8-9 hours. Can be sedated if gets up early with slurred speech but not if full night sleep. Balance better off Equetro. Plan: Started clozapine but needs to increase bc minimal effect and better tolerance, so increase to 50 mg HS  04/05/22 appt noted: Increased clozapine to 50 mg HS.  Some groggy in the AM.  One day was dizzy.  Otherwise on occasion.  Takes it right before bed.   Still depression, hopeless, irritability and anger.  Some periods of racing thoughts.  Sometimes recognizes hypomanic episodes and somethimes doesn't recognize. Dog is very sick and H with bone CA.  Not crying on clozapine as much. GERD and needs surgery for hiatal hernia. Signed up for water aerobics. Plan: Started clozapine but needs to increase bc minimal effect and better tolerance, so increase to 75 mg HS (Started clozapine on 12/07/21) Reduce librium 5 mg HS and plan to stop  05/10/22 appt med: TD partially better with Ingrezza 40.  Has  a grant.   Increased clozapine 75 mg HS, reduced Librium to 5 mg HS. Tolerated OK. Depression a little better.  Irritability still high.  Poor memory and easily confused. Sleep is pretty good and is better and needs to sleep longer.   Plan: DC librium Worsening TD partial response on 40 mg Ingrezza, increase to 60 mg daily   Continue clozapine 100 mg HS  05/17/2022 phone call complaining of sleeping more and feeling sleepy and foggy thinking also dropping some things and drooling.  She was instructed to reduce the clozapine to 75 mg nightly to see if that was the problem. 05/23/2022 phone call asking to increase Ingrezza from 60 to 80 mg daily.  It was agreed. 06/16/2022 phone call stating she had a bad manic episode the week prior and is still feeling excessively sedated.  Also having hand tremors. Instructed to stop lithium and continue clozapine 75 mg  nightly.  She is very med sensitive but we have few options left to treat her unstable bipolar disorder. 06/21/2022 phone call: After complaining of excessive sleepiness and excessive sleeping she is now complaining of insomnia.  07/07/22 appt noted: Current psychiatric medications include clozapine 75 mg nightly, Focalin 15 mg twice daily, lamotrigine 100 mg twice daily Ingrezza 80 mg daily.  She stopped lithium Has a list of concerns: SE drooling bad.   Still have mouth movements with the increase Ingrezza 80 mg daily but has stopped the tongue chewing. Goes to bed 9 PM and to sleep in 30 mins and awaken in the AM about 830 and hard to wake up.  Not napping in the day.  Getting enough sleep.   Notices Focalin kicking in when takes it. Mood depressed but not as bad.  Still some irritability, anger.  3 week ago bad manic anger lasting 3-4 days.  Not sure how her sleep was at the time. Some crying and poor impulse control.   PCP wanted 2nd opinion from neuro on ? PD, Dr. Arbutus Leas Nov 15. Plan: No med changes pending neurologic appointment  07/20/2022 neurology appointment Dr. Lurena Joiner Tat.  Diagnosed TD.  Assessment as follows: 1.  Tardive dyskinesia -The patients symptoms are most consistent with tardive dyskinesia.  She has had exposure to typical and atypical antipsychotic medication.  TD is a heterogeneous syndrome depending on a subtle balance between several neurotransmitters in the brain, including DA receptor blockade and hypersensitivity of DA and GABA receptors. -pt on ingrezza since 02/2022 and both she and notes from Dr. Jennelle Human indicate great benefit.  2.  Tremor             -Largely resolved off of lithium and vraylar (vraylar d/c in 12/2021)             -She has very minor left hand tremor.  Did tell her that Ingrezza can occasionally cause parkinsonism, but I did not see a significant degree of that today.             -I did reassure her today that I saw no evidence of idiopathic  Parkinson's disease.  She was reassured.  3.  Bipolar d/o             -difficult to control per records             -sees Dr. Jennelle Human frequently  4.  Discussed with patient that she really does not need neurologic follow-up at this point in time.  She was happy to hear this.  08/08/2022  appointment noted: No med changes. Still having a lot of anxiety and worries too much. Worse than depression.  No mania since here.  No sig avoidance.   Hard time concentration. Still having irritability and anger. SE drowsy with clozapine in AM and hard to function until about 10 AM.  Takes it about 8 PM and then goes to bed.   No falling but is shuffling more since here.   Sleep 10 hours.    09/07/22 appt noted:   Consistently on clozapine 75 mg HS.  Too drowsy if takes 25 mg in AM. Doing so so .  A lot of anxiety, anger, irritation.   Avg 8-9 hours of sleep and pushes herself to get up .  Hard to function in am until 11 or 12 noon. Ingreza 40 mg BID still some mouth movements and biting tongue.  Is better with Ingrezza but not gone.   SE consitpation and drowsy.   She is aware of the difficulty finding balance between aenough med to manage her sx and not so much to cause intolerable SE.  Disc dosing of her meds. Plan: Augment clozapine with fluvoxamine 25 mg HS  10/11/22 appt noted: : Current psych meds: Clozapine 75 mg nightly, Focalin 15 mg twice daily, fluvoxamine 25 mg nightly, lamotrigine 100 mg twice daily, Ingrezza 40 mg twice daily No noticeable change with fluvoxamine. Drooling worse in am and stupor until about 11 AM.   Mood still not good , angry and irritable a lot.  H acuses he rof going off her meds.  Less mouth movements and biting tonue. Sleep 9-10 hours. Anxiety still through the roof. Tremor better right now unless weak.   H prostate CA with bone mets and on pain meds. Plan: Augment clozapine with fluvoxamine but increase 50mg  HS also to help anxiety, irritability  11/09/2022  appointment noted: Added fluvoxamine 50 mg HS.  And is less anxious and more grounded.  Half as irritable as before fluvoxamine.   Down side is shuffling steps seem worse.  Not dizzy usually but balance isn't good.  Gets better after the day progresses.    Overall does feel improved.   Trembling better and mouth movements much better but drooling is bad nothing seems to help that. Drools in public is embarrassing. Tired a lot better as day progresses.   Plan: Augment clozapine with fluvoxamine but REDUCE TO 37.5 mg mg HS bc more shuffling of feet .  And reduced clozapine to 50 mg daily.  But did help anxiety, irritability Ingrezza for TD helped stop tongue chewing but still some mouth movements at 80 mg, which was started 05/23/22.  Shuffling a little more and can't reduce Ingrezza.  Split ingrezza 40 BID  12/12/22 appt noted: Made changes as above.  Asks about increasing Focalin 20 BID bc lack of energy and motivation and productivity.  No SE.   No jittery,  HA. Usally sleep Is good but not always. Still shuffling if gets up at night or before morning Focalin.  Then it clears up.  No change in shuffling since here last visit.  Other day used H's walker.  Like losing balance.   Mouth movements still there but not nearly as bad.  Worse if forgets a dose of Ingrezza.   Mood depressed and more anxious than last visit.  Constantly obsessive thinking about things that could go wrong.  More short tempered.  Impaitent at home only. H got bad report from onc that current chemo not workingand  it is changed with limited success rate.  This affects her mood too.   No change in sleep. Plan: Plan: First 2 weeks reduce Ingrezza to 60 mg at night to see if shuffling is better with samples. If shuffling is better call office for change in RX If so then increase clozapine back to 3 of the 25 mg capsules and fluvoxamine back to 50 mg .  12/19/22 TC with nurse: Lambert Keto, LPN   TS   1/61/09 11:16  AM Note Pt was in to see her therapist, Rockne Menghini today and asked to speak with nurse. She reports having apt with Dr. Jennelle Human last week on April 8th, and he reduced her Ingrezza to 60 mg, she feels that the issue isn't coming from that but the Fluvoxamine that she was put on a few months back. She reports she may not have explained herself well enough at the apt. She reports when she wakes up during the night she has to shuffle and hold on to the wall to keep from falling. She reports her husband has cancer and she needs to be more alert and able to function in case of emergency with him.  She reports nothing changed with the decrease in that medication.    Informed her I would discuss with Dr. Jennelle Human and get back with her.     12/19/22 MD resp:  Rip Harbour.  Reduce fluvoxamine from 1 and 1/2 tablets at night to 1/2 tablet twice daily.  Split dose to reduce SE      01/09/23 appt noted: Meds: clozapine 50 mg HS, reduced fluvoxamine 12.5 mg Am, forcalin 15 BID, Ingrezza 40 BID, lamotrigine 100 BID,  Not doing well.  Dep and high anxiety.  Some irritability.  Mood swings not dramatic.  No SI. Balance issues when up in middle of night.  Does better once takes morning meds with focalin.   Sometimes forgets afternoon Focalin. Mouth movements still there but better.  Drooling stopped. A lot of things seem like too much working.  Still some wiggling toes and may chew side of mouth , not severe. Plan: Plan: DC fluvoxamine Trial Viibryd 5 mg daily for 1 week then 10 mg daily.  02/06/23 appt noted: Meds:  viibryd 10 HS, clozapine 50 mg HS, off  fluvoxamine,  focalin 15 BID, Ingrezza 40 BID, lamotrigine 100 BID,  Walking better at night wihout fluvoxamine and less dep but mood swings and anxiety through the roof.   Seems higher than last time.  Worry about everything.   Reduced appetite.  Some am nausea.   Upset with H's drinking.  He has terminal CA, prostate stage 4 mets to bones.  Hared living with someone  terminal and also alcoholic.  Living with a lot of stress.   Less falling.   Mouth movements seem worse.  No drooling. Plan: increase Clozapine 62.5 mg HS for a week for mood swings and anxity and if fails but no SE then increase to 75 mg HS. Split ingrezza but increase bc TD worse lately to 60 mg BID  03/14/23 appt Noted: Increased clozapine to 75 mg HS. No SE except sedating at night.   Anxiety is still high but dep seems better.  Worry over H's CA and hosp last weekend.  Lots of medical bills.  General worry mostly about H's CA and alcoholism. Couple mood swings and racing obsessive thinking briefly. Sleeping well.  No SI Loss of appetite.   Plan: Increase to  Viibryd 15  mg daily for anxiety.   04/10/23 appt noted: Psych meds:  viibryd incr to 20 HS 2 weeks, clozapine 75 mg HS,  focalin usually 20 AM, Ingrezza 60 BID, lamotrigine 100 BID,  Noticed improvement in dep with incr Viibryd 20 mg without much change in anxiety.  Stress level is still high also.  H health getting worse and still drinking and fears having to call EMT bc of this. Taking Viibryd with food. Still some dizziness but no falls. Sleep is good.  No major mood swings. Still in counseling  and working on anxiety and low confidence. Worries over having to take care of finances.   Some racing thoughts in brief spells. Wants better cotrol of anxiety with constant worry and still irritable. Plan: For anxiety and irritability, reactivity incr clozapine to 100 mg HS  05/09/23 appt noted: Psych med: Clozapine 100 nightly, Focalin 15 twice daily, lamotrigine 100 twice daily, Ingrezza 60 mg twice daily, Viibryd 20 mg daily for 6 weeks. SE still shuffles at night bc afraid she will fall.  No worse.  No shuffling daytime. On occ taking Focalin 20 mg BID to help mood as primary benefit. Racing thoughts not often but not gone. Still a lot of worry.  Still has irritability. Some dep also but not as bad. Gets up one or two times nightly.    Plan: Increase Viibryd 30-then 40 mg daily for anxiety and depression.  06/08/23 appt noted: Psych meds as above;  Viibryd 40 mg. No SE with it as far as she knows She feels like mood has been more stable.  Still episodes of plunging but less often and briefer.  Still some bipolar moments doing things impulsively that later she regrets.   Anxiety still high but not as bad. Still shuffles at night only bc fear of losing balance but not a problem during the day.   Racing thoughts are better.   Sleep 8-10 and not drowsy.   Still some mouth puckering but better with Ingrezza.  Wonders if it will be covered after [facility]  07/10/23 apppt noted. Psych meds: Clozapine 100 @ 8 pm, Focalin 15 twice daily forgets 2nd dose, lamotrigine 100 twice daily, Ingrezza 60 twice daily, Viibryd 40 Not well.  10 day ago manic rage, yelling , cursing , slamming doors.  Lasted about an hour.  Totally out of control.  Last night H said something triggering anger and she was aggressive in speech.  Still seem to be angry and irritable.  Also racing thoughts random but chronic anxiety over finances.   With new [employer] advantage plan.  Looking into coverage for Ingrezza.  Concerned she might not be able to afford it.   Shuffles feet and balance problems when gets up and down.  Gets better when takes focalin I the morning.   Also having some depression    To bed 8 pm.   Plan: For anxiety and irritability, reactivity incr clozapine to 100 mg HS and 25 mg AM    08/07/23 appt noted: No further manic anger spells.  Some obsessive thinking and dep.Obs thoughts can be about any worries; like H's health.  H alcoholic and met prostate cancer and other problems.  $ concerns and dog sick.   Palliative care nurse found out his was drinking and she had RX Oxycontin and then she stopped RX.  He prays for death DT pain.  Thinks she would fall to pieces if he died.  H on chemo.   No falls but  shuffles at night.   No napping.   Changing insurance  first of year and will need PA of Ingrezza.  Don't think they will cover Focalin.   Psych meds:  clozapine 25 AM and 100 PM, Focalin 15 mg BID, lamotrigine 100 BID, Ingrezza 60 BID, Viibryd 40. ER visit with palp but none further and EKG unremarkable.   Hard time going to sleep.   So much on my mind.   To bed 8-830pm.   Sleep 10-11 hours usually. Plan INCREASE CLOZAPINE 50 MG am AND 100 MG HS  09/11/23 appt noted: Psych meds: clozapine 50 AM and 100 PM, Focalin 15 mg BID, lamotrigine 100 BID, Ingrezza 60 BID, Viibryd 40. Dep, no motivation, stutter understress fairly new.   Some racing thoughts and obs over things seems worse.  Mainly worry about normal things.  Will obsess when goes to store that she might lose something like her keys.  That has been going on for awhile.   Getting to bed late but good sleep when falls asleep.   In the morning still shuffles but once takes morning meds it is better.  Probably from the Focalin.  Talked to [place] at [place] but forgot to do what she said and ingrezza denied. Generic Focalin is covered.    No change in H's health.  Palliative care nurse involved.   She thinks anxiety and depression are sky high but dep probably worse.   Still has some mouth movements and grinding teeth. Plan: Increase  Focalin IR 20 mg BID  for ADD and off label for depression   10/10/23 appt noted: Psych meds: clozapine 50 AM and 100 PM,  lamotrigine 100 BID, Ingrezza 60 BID, Viibryd 40. Increase Focalin 20 mg BID, Been depressed, super anxious and confused.  She doesn't think it was Focalin.   Depression is pretty bad.  Not sure of triggers except living with person who's alcoholic and has CA.  Seeing oncologist.  Has palliative care doctor.  He only sits and watches podcasts.  She feels overwhelmed bc he can't do anything around the house.   He hass a lot of pain interfering with function.  Previously would do coooking, cleaning, bills,.  She's afraid of making a mistake.  Her  confusion is gernalized.  Hard to make decisions. Thinks incr Focalin did helpf dep some.  Would not want to reduce it back to where it was. Would like to try it longer.   Not sleepy from clozapine.  But still shuffles and balance issues at night ? Related to clozapine vs something else. No new health problems.  Palliative care nurse monthly.   He can do his own ADLs unless drinks too much.   No family or church visits.   She still goes to church. No falls lately.  His PSA is going up.   Taking oral chemo.  11/09/23 appt noted: Psych meds: clozapine 50 AM and 100 PM,  lamotrigine 100 BID, Ingrezza 60 BID, Viibryd 40. Increased Focalin 20 mg BID,  can forget 2nd dose Still being affected by dep and anxiety most day.  Consistent.  When takes Focalin twice daily does feel better. SE pretty good.  Occ dizziness and balance issue, random but orthostatic.  Still worse at night. Morning clozapine does not make her sleepy. 7-8 hours sleep Still has mouth movements.  Not highly bothersome. Remembering both doses.    Past Psychiatric Medication Trials: Vraylar 4.5 SE mouth movements reduced to 3 mg 3/20. It was effective  at lower doses for depression.  Worse off it.  Vraylar 1.5 mg every third day led to relapse of significant depression. [place] SE at 42 mg .  Cost problems Latuda 80, , olanzapine, Seroquel, risperidone, Abilify, loxapine 25 mg BID (max tolerated) NR, Clozapine 100  Ingrezza 60 BID  helps No [place]  lithium 150 tremor   Trileptal 450, Depakote, Equetro 300 hx balance issues, CBZ ER falling,   Lamictal 200 twice daily,  Focalin 15 BID,  Ritalin,   fluoxetine 60,  sertraline 100, Wellbutrin history of facial tics, paroxetine cognitive side effects, Lexapro 10 worse Fluvoxamine 50  buspirone,   ropinirole, amantadine, Sinemet, Artane, Cogentin,  pramipexole 0.5 mg BID helped tremor but caused anger trazodone hangover, Ambien hangover,  Review of Systems:  Review of  Systems  HENT:  Positive for dental problem and tinnitus.        Chirping cricket sounds in hears since January 2023 drooling  Respiratory:  Negative for cough.   Cardiovascular:  Negative for chest pain.  Gastrointestinal:  Negative for abdominal pain and nausea.       GERD awakening her  Musculoskeletal:  Positive for arthralgias and gait problem.  Neurological:  Positive for dizziness and tremors. Negative for weakness.       Ankle problems and balance problems. No falls lately. Occ stumbles. Tremor is better in hands Mouth movements, mild lip licking.    Psychiatric/Behavioral:  Positive for depression and dysphoric mood. Negative for agitation, behavioral problems, confusion, decreased concentration, hallucinations, self-injury, sleep disturbance and suicidal ideas. The patient is nervous/anxious. The patient is not hyperactive.   No falls since here. Not currently depressed but unable to remove this from the list.  Medications: I have reviewed the patient's current medications.  Current Outpatient Medications  Medication Sig Dispense Refill   acetaminophen (TYLENOL) 650 MG CR tablet Take 1,300 mg by mouth as needed for pain.     amoxicillin (AMOXIL) 500 MG capsule SMARTSIG:4 Capsule(s) By Mouth     atorvastatin (LIPITOR) 20 MG tablet Take 1 tablet (20 mg total) by mouth every evening. 90 tablet 3   clozapine (CLOZARIL) 25 MG tablet Take 3 tablets (75 mg total) by mouth 2 (two) times daily. (Patient taking differently: Take 75 mg by mouth 2 (two) times daily. Currently 50 mg AM and 100 HS) 180 tablet 1   dexmethylphenidate (FOCALIN) 10 MG tablet Take 2 tablets (20 mg total) by mouth 2 (two) times daily. 120 tablet 0   Ferrous Gluconate (IRON 27 PO) Take by mouth.     INGREZZA 60 MG capsule TAKE 1 CAPSULE BY MOUTH TWICE A DAY 60 capsule 1   ketoconazole (NIZORAL) 2 % cream Apply 1 Application topically daily. 60 g 0   lamoTRIgine (LAMICTAL) 100 MG tablet Take 1 tablet (100 mg total)  by mouth 2 (two) times daily. 180 tablet 0   Multiple Vitamin (MULTIVITAMIN ADULT PO) Take by mouth.     pantoprazole (PROTONIX) 40 MG tablet TAKE (1) TABLET BY MOUTH TWICE DAILY. 180 tablet 1   senna (SENOKOT) 8.6 MG tablet Take 1 tablet by mouth daily.     Vilazodone HCl (VIIBRYD) 40 MG TABS Take 1.5 tablets (60 mg total) by mouth daily. (Patient taking differently: Take 60 mg by mouth daily. Currently 40 mg daily) 135 tablet 0   Dexmethylphenidate HCl 40 MG CP24 Take 1 capsule (40 mg total) by mouth every morning. (Patient not taking: Reported on 11/09/2023) 30 capsule 0   No current facility-administered  medications for this visit.    Medication Side Effects: Other: tremor and weight gain.   Dyskinesia appears better  SE bettter than they were.  Balance problems intermittently  Allergies:  Allergies  Allergen Reactions   Azithromycin Anaphylaxis   Penicillins Anaphylaxis    DID THE REACTION INVOLVE: Swelling of the face/tongue/throat, SOB, or low BP? Yes Sudden or severe rash/hives, skin peeling, or the inside of the mouth or nose? Yes Did it require medical treatment? No When did it last happen?       If all above answers are "NO", may proceed with cephalosporin use.  Patient reacts to Z pack.  HAS Taken amoxicillin fine.   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    Esophageal stricture    Fracture of superior pubic ramus (HCC) 11/28/2018   GERD (gastroesophageal reflux disease)    Headache(784.0)    Hyperlipidemia    Interstitial cystitis    Joint pain    Lactose intolerance    Lung cancer (HCC) 2002    Neuromuscular disorder (HCC)    Obesity    Osteoarthritis    Osteoporosis    Palpitations    Sleep apnea    Doesn't use a CPAP   Suicidal ideation 01/20/2020   Swallowing difficulty    Tardive dyskinesia     Family History  Problem Relation Age of Onset   Arthritis Mother    Hearing loss Mother    Hyperlipidemia Mother    Hypertension Mother    Depression Mother    Anxiety disorder Mother    Obesity Mother    Sudden death Mother    Hypertension Father    Diabetes Mellitus II Father    Heart disease Father    Arthritis Father    Cancer Father        Brain   COPD Father    Diabetes Father    Hyperlipidemia Father    Sleep apnea Father    Early death Sister        Aneroxia/Bulimic   Depression Brother    Early death Hydrographic surveyor Accident   Stroke Maternal Grandmother    Hypertension Maternal Grandmother    Arthritis Maternal Grandfather    Heart attack Maternal Grandfather    Hearing loss Maternal Grandfather    Depression Daughter    Drug abuse Daughter    Heart disease Daughter    Hypertension Daughter    Colon cancer Neg Hx    Esophageal cancer Neg Hx    Rectal cancer Neg Hx    Colon polyps Neg Hx    Stomach cancer Neg Hx     Social History   Socioeconomic History   Marital status: Married    Spouse name: Not on file   Number of children: 1   Years of education: 12   Highest education level: Not on file  Occupational History   Occupation: retired  Tobacco Use   Smoking  status: Never   Smokeless tobacco: Never  Vaping Use   Vaping status: Never Used  Substance and Sexual Activity   Alcohol use: Not Currently    Alcohol/week: 1.0 standard drink of alcohol    Types: 1 Glasses of wine per week    Comment: 1 glass wine occassionally   Drug use: No   Sexual activity: Yes  Other Topics Concern   Not on file  Social History Narrative   Pt lives in [place] with husband [name].  Followed by Dr. Jennelle Human for psychiatry and Rockne Menghini for  therapy.   Right handed   Drinks caffeine   One story home   Married lives with husband   retired   Chief Executive Officer Drivers of Corporate investment banker Strain: Low Risk  (06/01/2023)   Overall Financial Resource Strain (CARDIA)    Difficulty of Paying Living Expenses: Not hard at all  Food Insecurity: No Food Insecurity (06/22/2023)   Hunger Vital Sign    Worried About Running Out of Food in the Last Year: Never true    Ran Out of Food in the Last Year: Never true  Transportation Needs: No Transportation Needs (06/22/2023)   PRAPARE - Administrator, Civil Service (Medical): No    Lack of Transportation (Non-Medical): No  Physical Activity: Inactive (06/01/2023)   Exercise Vital Sign    Days of Exercise per Week: 0 days    Minutes of Exercise per Session: 0 min  Stress: Stress Concern Present (06/01/2023)   [employer] of Occupational Health - Occupational Stress Questionnaire    Feeling of Stress : Very much  Social Connections: Socially Integrated (06/01/2023)   Social Connection and Isolation Panel [NHANES]    Frequency of Communication with Friends and Family: More than three times a week    Frequency of Social Gatherings with Friends and Family: More than three times a week    Attends Religious Services: More than 4 times per year    Active Member of [employer] or Organizations: Yes    Attends Banker Meetings: More than 4 times per year    Marital Status: Married  Catering manager Violence: Not At Risk (06/01/2023)   Humiliation, Afraid, Rape, and Kick questionnaire    Fear of Current or Ex-Partner: No    Emotionally Abused: No    Physically Abused: No    Sexually Abused: No    Past Medical History, Surgical history, Social history, and Family history were reviewed and updated as appropriate.   Please see review of systems for further details on the patient's review from today.   Objective:   Physical Exam:  There were no vitals taken for this  visit.  Physical Exam Neurological:     Mental Status: She is alert and oriented to person, place, and time.     Cranial Nerves: No dysarthria.     Gait: Gait normal.     Comments: Lip licking consistent, no worse Good gait  Psychiatric:        Attention and Perception: Attention and perception normal.        Mood and Affect: Mood is anxious and depressed. Affect is not tearful.        Speech: Speech normal.        Behavior: Behavior is cooperative.        Thought Content: Thought content normal. Thought content is not paranoid or delusional. Thought content does not include homicidal or suicidal ideation. Thought content does not include suicidal  plan.        Cognition and Memory: Cognition and memory normal.        Judgment: Judgment normal.     Comments: Insight intact Ongoing residual dep, irritability, anxiety with dep worst     Lab Review:     Component Value Date/Time   NA 139 08/02/2023 1214   NA 142 03/22/2023 0916   K 4.2 08/02/2023 1214   CL 106 08/02/2023 1214   CO2 26 08/02/2023 1214   GLUCOSE 101 (H) 08/02/2023 1214   BUN 17 08/02/2023 1214   BUN 12 03/22/2023 0916   CREATININE 0.85 08/02/2023 1214   CALCIUM 9.8 08/02/2023 1214   PROT 7.2 03/22/2023 0916   ALBUMIN 4.5 03/22/2023 0916   AST 27 03/22/2023 0916   ALT 14 03/22/2023 0916   ALKPHOS 68 03/22/2023 0916   BILITOT 0.7 03/22/2023 0916   GFRNONAA >60 08/02/2023 1214   GFRAA 96 03/02/2020 1433       Component Value Date/Time   WBC 8.0 08/02/2023 1214   RBC 4.29 08/02/2023 1214   HGB 13.2 08/02/2023 1214   HGB 14.8 03/22/2023 0916   HCT 39.8 08/02/2023 1214   HCT 47.3 (H) 03/22/2023 0916   PLT 188 08/02/2023 1214   PLT 225 03/22/2023 0916   MCV 92.8 08/02/2023 1214   MCV 92 03/22/2023 0916   MCH 30.8 08/02/2023 1214   MCHC 33.2 08/02/2023 1214   RDW 14.8 08/02/2023 1214   RDW 13.8 03/22/2023 0916   LYMPHSABS 3.4 (H) 03/22/2023 0916   MONOABS 0.7 04/04/2019 1004   EOSABS 0.2 03/22/2023  0916   BASOSABS 0.0 03/22/2023 0916  Vitamin D level 33 on 10K units daily on 12/4/`9 Increased to prescription vitamin d 50K units Monday, Wed, Friday.  Rx sent in.   Lithium Lvl  Date Value Ref Range Status  10/21/2018 0.18 (L) 0.60 - 1.20 mmol/L Final    Comment:    Performed at [facility], [address], [place], [place] [address]     No results found for: "PHENYTOIN", "PHENOBARB", "VALPROATE", "CBMZ"   .res Assessment: Plan:    Attention deficit hyperactivity disorder (ADHD), predominantly inattentive type - Plan: Dexmethylphenidate HCl 40 MG CP24  Bipolar disorder with moderate depression (HCC) - Plan: clozapine (CLOZARIL) 25 MG tablet, Vilazodone HCl (VIIBRYD) 40 MG TABS  Generalized anxiety disorder - Plan: Vilazodone HCl (VIIBRYD) 40 MG TABS  Tardive dyskinesia  Mild cognitive impairment  Insomnia due to mental condition  Long term current use of clozapine  Tremor of both hands  Low vitamin D level  30 min face to face time with patient was spent on counseling and coordination of care. We discussed multiple dxes and concerns.   [place]ita has chronic rapid cycling bipolar disorder which is chronically unstable and has been difficult to control.  Failed 14 different mood stabilizers.  The rapid cycling is making it difficult to control frequency of depressive episodes and the anxiety as well.  We have typically had to make frequent med changes.   Disc gradual increase bc med sensitivity.  Med sensitivity to SE seems to be the biggest problem preventing better mood control  Disc options for better control of dep, irritability and anxiety.  Incr Viibryd vs clozapine  Ingrezza for TD helped reduce tongue chewing but still some mouth movements at 80 mg, which was started 05/23/22.  Shuffling only at night now.  Drooling resolved  Split ingrezza but increase bc TD worse lately to 60 mg BID Consider  Austedo.  Disc cost concerns  .  Getting grant but that might end.  Need to  pick insurance that will cover it.  Disc Coverage app.  Disc ECT. Only FDA approved option left. She wants to defer.    Extensive discussion of clozapine dosing recommendations but we will increase more slowly bc she is so med sensitive.Disc risk low WBC, cardiomyopathy, etc, sedation  (Started clozapine on 12/07/21).    Check CBC with diff every 4 weeks.  Now.  Option increase clozapine for anxiety.  Disc SR risk.   For anxiety and irritability, reactivity , mood swings continue clozapine but switch to 75 mg BID  Change to reduce HS dizziness at HS and help anxiety more daytime.  She doesn't get sleepy with it.  Likely to work better than incr Viibryd but consider latter.  She has a high residual anxiety.  It has been impossible to control all of her symptoms simultaneously without causing side effects. Failed various meds.  Discussed side effects of each medicine. Ok with Increase  Focalin IR 20 mg BID  for ADD and off label for depression . But  She is forgetting second doses which makes her feel worse.   Try to spread this out if possible for mood.   Try to get Focalin XR to improve compliance 40 mg AM Discussed potential benefits, risks, and side effects of stimulants with patient to include increased heart rate, palpitations, insomnia, increased anxiety, increased irritability, or decreased appetite.  Instructed patient to contact office if experiencing any significant tolerability issues. Consider modafinil if necessary  Would like to avoid BZ if possible.  Lamotrigine 100 mg twice daily to try to reduce polypharmacy and so improve tolerability of clozapine.  consider reduction but not now bc depression.  Failed all reasonable alternatives for anxiety.  Option Auvelity.  She is under a lot of genuine stress.  Discussed potential metabolic side effects associated with atypical antipsychotics, as well as potential risk for movement side effects. Advised pt to contact office if movement  side effects occur.    Checked B12 folate bc memory complaints.  Normal B12 & folate on 05/25/22  Disc SE meds and this is heightened by the complication of necessary polypharmacy.  Continue counseling . It helps Rec exercise as it would help.  Take advantage of family support  Med changes: Increase vilazodone to 1 and 1/2 tablets daily with food for depression.Take with food.   and usually with dinner. Switch clozapine to 75 mg twice daily to help anxiety and reduce night time side effects Will try to get extended release Focalin covered and if so it will be 40 mg once in the morning.  Requires frequent FU DT chronic instability.  Wants to schedule monthly.  FU 4 weeks.   Meredith Staggers MD, DFAPA  Please see After Visit Summary for patient specific instructions.     Future Appointments  Date Time Provider Department Center  11/13/2023 10:00 AM Mathis Fare, LCSW [facility] None  11/17/2023 12:30 PM Hilarie Fredrickson, MD [facility]  11/27/2023 10:00 AM Mathis Fare, LCSW [facility] None  12/12/2023 10:00 AM Mathis Fare, LCSW [facility] None  12/14/2023 10:30 AM Cottle, Steva Ready., MD [facility] None  12/25/2023 10:00 AM Mathis Fare, LCSW [facility] None  01/08/2024 10:00 AM Mathis Fare, LCSW [facility] None  01/09/2024 10:00 AM Cottle, Steva Ready., MD [facility] None  03/27/2024  2:40 PM Anabel Halon, MD [facility]  06/05/2024 10:40 AM RPC-ANNUAL WELLNESS VISIT [facility]  [facility]    No orders of the defined types were placed in this encounter.      -------------------------------

## 2023-11-09 NOTE — Patient Instructions (Addendum)
 Increase vilazodone to 1 and 1/2 tablets daily with food for depression. Switch clozapine to 75 mg twice daily to help anxiety and reduce night time side effects Will try to get extended release Focalin covered and if so it will be 40 mg once in the morning.

## 2023-11-10 ENCOUNTER — Encounter: Payer: Self-pay | Admitting: Internal Medicine

## 2023-11-10 ENCOUNTER — Telehealth: Payer: Self-pay

## 2023-11-10 NOTE — Telephone Encounter (Signed)
 Prior Authorization Focalin XR 40 mg #30/30

## 2023-11-13 ENCOUNTER — Ambulatory Visit (INDEPENDENT_AMBULATORY_CARE_PROVIDER_SITE_OTHER): Payer: No Typology Code available for payment source | Admitting: Psychiatry

## 2023-11-13 ENCOUNTER — Telehealth: Payer: Self-pay | Admitting: Psychiatry

## 2023-11-13 DIAGNOSIS — F411 Generalized anxiety disorder: Secondary | ICD-10-CM

## 2023-11-13 DIAGNOSIS — F3132 Bipolar disorder, current episode depressed, moderate: Secondary | ICD-10-CM
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
## 2023-11-13 NOTE — Telephone Encounter (Signed)
 Pt came in today to say that her viibryd needs a PA

## 2023-11-14 NOTE — Telephone Encounter (Signed)
 Pt taking 1.5 tablets of Vilazodone 40 mg daily which will require a PA for quantity and high dose for treatment resistant depression. Forms for [employer] completed and faxed along with office note. Pending reply

## 2023-11-15 MED ORDER — VILAZODONE HCL 40 MG PO TABS: 40.0000 mg | ORAL_TABLET | Freq: Every day | ORAL | 2 refills | Status: DC

## 2023-11-15 MED ORDER — VILAZODONE HCL 20 MG PO TABS: ORAL_TABLET | ORAL | 2 refills | Status: DC

## 2023-11-15 NOTE — Telephone Encounter (Signed)
 Will submit 2 separate Rx's for pt to equal the 60 mg total daily, Vilazodone 40 mg 1 tablet daily #30 and Vilazodone 20 mg 1 tablet daily #30.

## 2023-11-15 NOTE — Telephone Encounter (Signed)
 [employer] Medicare Part D denied the request for Vilazodone 40 mg 1.5 tablets daily, #135 for 90 days, she can only get #30 for 30 days. Higher dose then 40 mg is not FDA approved per response.

## 2023-11-17 ENCOUNTER — Other Ambulatory Visit: Payer: Self-pay | Admitting: Psychiatry

## 2023-11-17 ENCOUNTER — Ambulatory Visit: Payer: PPO | Admitting: Internal Medicine

## 2023-11-17 ENCOUNTER — Encounter: Payer: Self-pay | Admitting: Internal Medicine

## 2023-11-17 VITALS — BP 111/84 | HR 76 | Temp 97.1°F | Resp 16 | Ht 61.0 in | Wt 124.0 lb

## 2023-11-17 DIAGNOSIS — Z1211 Encounter for screening for malignant neoplasm of colon: Secondary | ICD-10-CM

## 2023-11-17 DIAGNOSIS — Z860101 Personal history of adenomatous and serrated colon polyps: Secondary | ICD-10-CM | POA: Diagnosis not present

## 2023-11-17 DIAGNOSIS — G4730 Sleep apnea, unspecified: Secondary | ICD-10-CM | POA: Diagnosis not present

## 2023-11-17 DIAGNOSIS — K633 Ulcer of intestine: Secondary | ICD-10-CM | POA: Diagnosis not present

## 2023-11-17 DIAGNOSIS — F319 Bipolar disorder, unspecified: Secondary | ICD-10-CM | POA: Diagnosis not present

## 2023-11-17 DIAGNOSIS — D120 Benign neoplasm of cecum: Secondary | ICD-10-CM

## 2023-11-17 DIAGNOSIS — D122 Benign neoplasm of ascending colon: Secondary | ICD-10-CM

## 2023-11-17 DIAGNOSIS — F314 Bipolar disorder, current episode depressed, severe, without psychotic features: Secondary | ICD-10-CM

## 2023-11-17 DIAGNOSIS — F419 Anxiety disorder, unspecified: Secondary | ICD-10-CM | POA: Diagnosis not present

## 2023-11-17 DIAGNOSIS — Z860100 Personal history of colon polyps, unspecified: Secondary | ICD-10-CM

## 2023-11-17 MED ORDER — SODIUM CHLORIDE 0.9 % IV SOLN: 500.0000 mL | INTRAVENOUS | Status: DC

## 2023-11-17 NOTE — Op Note (Signed)
 [facility] Patient Name: Megan Whitney Procedure Date: 11/17/2023 1:03 PM MRN: [id number] Endoscopist: Wilhemina Bonito. Marina Goodell , MD, [id number] Age: 68 Referring MD:  Date of Birth: 24-Nov-1955 Gender: Female Account #: [id number] Procedure:                Colonoscopy with cold snare polypectomy x 3; with                            biopsies Indications:              High risk colon cancer surveillance: Personal                            history of multiple (3 or more) adenomas Medicines:                Monitored Anesthesia Care Procedure:                Pre-Anesthesia Assessment:                           - Prior to the procedure, a History and Physical                            was performed, and patient medications and                            allergies were reviewed. The patient's tolerance of                            previous anesthesia was also reviewed. The risks                            and benefits of the procedure and the sedation                            options and risks were discussed with the patient.                            All questions were answered, and informed consent                            was obtained. Prior Anticoagulants: The patient has                            taken no anticoagulant or antiplatelet agents. ASA                            Grade Assessment: II - A patient with mild systemic                            disease. After reviewing the risks and benefits,                            the patient was deemed in satisfactory condition to  undergo the procedure.                           After obtaining informed consent, the colonoscope                            was passed under direct vision. Throughout the                            procedure, the patient's blood pressure, pulse, and                            oxygen saturations were monitored continuously. The                            CF [id number] #[id number] was  introduced through the anus                            and advanced to the the cecum, identified by                            appendiceal orifice and ileocecal valve. The                            terminal ileum, ileocecal valve, appendiceal                            orifice, and rectum were photographed. The quality                            of the bowel preparation was excellent. The                            colonoscopy was performed without difficulty. The                            patient tolerated the procedure well. The bowel                            preparation used was SUPREP via split dose                            instruction. Scope In: 1:19:05 PM Scope Out: 1:41:04 PM Scope Withdrawal Time: 0 hours 14 minutes 29 seconds  Total Procedure Duration: 0 hours 21 minutes 59 seconds  Findings:                 The terminal ileum appeared normal.                           Three polyps were found in the ascending colon and                            cecum. The polyps were 2 to 5 mm in size. These  polyps were removed with a cold snare. Resection                            and retrieval were complete.                           Nonbleeding superficial ulcerated mucosa, measuring                            about 2 cm, was present in the cecum. Biopsies were                            taken with a cold forceps for histology.                           The exam was otherwise without abnormality on                            direct and retroflexion views. Complications:            No immediate complications. Estimated blood loss:                            None. Estimated Blood Loss:     Estimated blood loss: none. Impression:               - The examined portion of the ileum was normal.                           - Three 2 to 5 mm polyps in the ascending colon and                            in the cecum, removed with a cold snare. Resected                             and retrieved.                           - Mucosal ulceration in the cecum. Biopsied.                           - The examination was otherwise normal on direct                            and retroflexion views. Recommendation:           - Repeat colonoscopy in 5 years for surveillance.                           - Patient has a contact number available for                            emergencies. The signs and symptoms of potential  delayed complications were discussed with the                            patient. Return to normal activities tomorrow.                            Written discharge instructions were provided to the                            patient.                           - Resume previous diet.                           - Continue present medications.                           - Await pathology results. Wilhemina Bonito. Marina Goodell, MD 11/17/2023 1:49:10 PM This report has been signed electronically.

## 2023-11-17 NOTE — Progress Notes (Signed)
 Pt's states no medical or surgical changes since previsit or office visit.

## 2023-11-17 NOTE — Progress Notes (Signed)
 Sedate, gd SR, tolerated procedure well, VSS, report to RN

## 2023-11-17 NOTE — Patient Instructions (Signed)
-  Handout on polyps provided -await pathology results -repeat colonoscopy for surveillance in 5 years recommended  -Continue present medications     YOU HAD AN ENDOSCOPIC PROCEDURE TODAY AT THE [facility]:   Refer to the procedure report that was given to you for any specific questions about what was found during the examination.  If the procedure report does not answer your questions, please call your gastroenterologist to clarify.  If you requested that your care partner not be given the details of your procedure findings, then the procedure report has been included in a sealed envelope for you to review at your convenience later.  YOU SHOULD EXPECT: Some feelings of bloating in the abdomen. Passage of more gas than usual.  Walking can help get rid of the air that was put into your GI tract during the procedure and reduce the bloating. If you had a lower endoscopy (such as a colonoscopy or flexible sigmoidoscopy) you may notice spotting of blood in your stool or on the toilet paper. If you underwent a bowel prep for your procedure, you may not have a normal bowel movement for a few days.  Please Note:  You might notice some irritation and congestion in your nose or some drainage.  This is from the oxygen used during your procedure.  There is no need for concern and it should clear up in a day or so.  SYMPTOMS TO REPORT IMMEDIATELY:  Following lower endoscopy (colonoscopy or flexible sigmoidoscopy):  Excessive amounts of blood in the stool  Significant tenderness or worsening of abdominal pains  Swelling of the abdomen that is new, acute  Fever of 100F or higher  For urgent or emergent issues, a gastroenterologist can be reached at any hour by calling [phone]. Do not use MyChart messaging for urgent concerns.    DIET:  We do recommend a small meal at first, but then you may proceed to your regular diet.  Drink plenty of fluids but you should avoid alcoholic beverages for  24 hours.  ACTIVITY:  You should plan to take it easy for the rest of today and you should NOT DRIVE or use heavy machinery until tomorrow (because of the sedation medicines used during the test).    FOLLOW UP: Our staff will call the number listed on your records the next business day following your procedure.  We will call around 7:15- 8:00 am to check on you and address any questions or concerns that you may have regarding the information given to you following your procedure. If we do not reach you, we will leave a message.     If any biopsies were taken you will be contacted by phone or by letter within the next 1-3 weeks.  Please call us at [phone] if you have not heard about the biopsies in 3 weeks.    SIGNATURES/CONFIDENTIALITY: You and/or your care partner have signed paperwork which will be entered into your electronic medical record.  These signatures attest to the fact that that the information above on your After Visit Summary has been reviewed and is understood.  Full responsibility of the confidentiality of this discharge information lies with you and/or your care-partner.

## 2023-11-17 NOTE — Progress Notes (Signed)
 HISTORY OF PRESENT ILLNESS:  Megan Whitney is a 68 y.o. female with a history of multiple adenomatous colon polyps.  Presents today for surveillance colonoscopy.  No complaints  REVIEW OF SYSTEMS:  All non-GI ROS negative except for  Past Medical History:  Diagnosis Date   ADD (attention deficit disorder)    Allergy    Seasonal   Anemia    History of GI blood loss   Anxiety    Arthritis    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    Esophageal stricture    Fracture of superior pubic ramus (HCC) 11/28/2018   GERD (gastroesophageal reflux disease)    Headache(784.0)    Hyperlipidemia    Interstitial cystitis    Joint pain    Lactose intolerance    Lung cancer (HCC) 2002   Neuromuscular disorder (HCC)    Obesity    Osteoarthritis    Osteoporosis    Palpitations    Sleep apnea    Doesn't use a CPAP   Suicidal ideation 01/20/2020   Swallowing difficulty    Tardive dyskinesia     Past Surgical History:  Procedure Laterality Date   BALLOON DILATION  05/16/2012   Procedure: BALLOON DILATION;  Surgeon: Louis Meckel, MD;  Location: [facility] ENDOSCOPY;  Service: Endoscopy;  Laterality: N/A;   BUNIONECTOMY  2011   COLONOSCOPY     ENTEROSCOPY  05/16/2012   Procedure: ENTEROSCOPY;  Surgeon: Louis Meckel, MD;  Location: [facility] ENDOSCOPY;  Service: Endoscopy;  Laterality: N/A;   JOINT REPLACEMENT     right shoulder durgery 25 yrs ago  1988   TOTAL HIP ARTHROPLASTY Bilateral 2006, 2008   bilateral   TUBAL LIGATION  1990   WEDGE RESECTION  2002   lung cancer    Social History Megan Whitney  reports that she has never smoked. She has never used smokeless tobacco. She reports that she does not currently use alcohol after a past usage of about 1.0 standard drink of alcohol per week. She reports that she does not use  drugs.  family history includes Anxiety disorder in her mother; Arthritis in her father, maternal grandfather, and mother; COPD in her father; Cancer in her father; Depression in her brother, daughter, and mother; Diabetes in her father; Diabetes Mellitus II in her father; Drug abuse in her daughter; Early death in her brother and sister; Hearing loss in her maternal grandfather and mother; Heart attack in her maternal grandfather; Heart disease in her daughter and father; Hyperlipidemia in her father and mother; Hypertension in her daughter, father, maternal grandmother, and mother; Obesity in her mother; Sleep apnea in her father; Stroke in her maternal grandmother; Sudden death in her mother.  Allergies  Allergen Reactions   Azithromycin Anaphylaxis   Penicillins Anaphylaxis    DID THE REACTION INVOLVE: Swelling of the face/tongue/throat, SOB, or low BP? Yes Sudden or severe rash/hives, skin peeling, or the inside of the mouth or nose? Yes Did it require medical treatment? No When did it last happen?       If all above answers are "NO", may proceed with cephalosporin use.  Patient reacts to Z pack.  HAS Taken amoxicillin fine.   Adhesive [Tape] Other (See Comments)    On bandaids  PHYSICAL EXAMINATION: Vital signs: BP 131/82   Pulse 83   Temp (!) 97.1 F (36.2 C)   Ht 5\' 1"  (1.549 m)   Wt 124 lb (56.2 kg)   SpO2 95%   BMI 23.43 kg/m  General: Well-developed, well-nourished, no acute distress HEENT: Sclerae are anicteric, conjunctiva pink. Oral mucosa intact Lungs: Clear Heart: Regular Abdomen: soft, nontender, nondistended, no obvious ascites, no peritoneal signs, normal bowel sounds. No organomegaly. Extremities: No edema Psychiatric: alert and oriented x3. Cooperative     ASSESSMENT:  Personal history of multiple adenomatous polyps  PLAN:   Surveillance colonoscopy

## 2023-11-17 NOTE — Progress Notes (Signed)
 Called to room to assist during endoscopic procedure.  Patient ID and intended procedure confirmed with present staff. Received instructions for my participation in the procedure from the performing physician.

## 2023-11-20 ENCOUNTER — Other Ambulatory Visit: Payer: Self-pay | Admitting: Psychiatry

## 2023-11-20 ENCOUNTER — Telehealth: Payer: Self-pay | Admitting: *Deleted

## 2023-11-20 MED ORDER — DEXMETHYLPHENIDATE HCL ER 40 MG PO CP24: 40.0000 mg | ORAL_CAPSULE | Freq: Every morning | ORAL | 0 refills | Status: DC

## 2023-11-20 NOTE — Telephone Encounter (Signed)
  Follow up Call-     11/17/2023   12:49 PM 02/25/2022    9:19 AM  Call back number  Post procedure Call Back phone  # [phone]  Permission to leave phone message Yes Yes     Patient questions:  Do you have a fever, pain , or abdominal swelling? No. Pain Score  0 *  Have you tolerated food without any problems? Yes.    Have you been able to return to your normal activities? Yes.    Do you have any questions about your discharge instructions: Diet   No. Medications  No. Follow up visit  No.  Do you have questions or concerns about your Care? No.  Actions: * If pain score is 4 or above: No action needed, pain <4.

## 2023-11-22 ENCOUNTER — Encounter: Payer: Self-pay | Admitting: Internal Medicine

## 2023-11-22 LAB — SURGICAL PATHOLOGY

## 2023-11-23 NOTE — Telephone Encounter (Signed)
 Clarification on pt's medication, I reached out again to pharmacy they have pt's Focalin 10 mg waiting for her to pick up until the Focalin XR 40 mg is approved.  Pt also was confused on her Vilazodone. Pt recently picked up 20 mg tablets to go along with her 40 mg tablets she already had at home. She is aware to take both together to equal 60 mg.    Contacted [employer] to make sure they were processing the PA request for Focalin XR 40 mg, the rep reports it is being processed as of 3/19 and there will be a response by 3/22.  In addition her Ingrezza 60 mg capsule PA expires on 12/12/23, I tried submitting a renewal but the rep reports it can't be completed until the day of or day after the expiration since she already has a current one on file.

## 2023-11-23 NOTE — Telephone Encounter (Signed)
 Noted.  So I am assuming that I do not need to sign another Focalin IR RX.  Thanks.

## 2023-11-27 ENCOUNTER — Ambulatory Visit: Payer: No Typology Code available for payment source | Admitting: Psychiatry

## 2023-11-27 ENCOUNTER — Telehealth: Payer: Self-pay

## 2023-11-27 DIAGNOSIS — F3132 Bipolar disorder, current episode depressed, moderate: Secondary | ICD-10-CM
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
## 2023-11-27 NOTE — Telephone Encounter (Signed)
 Reduce clozapine to 75 mg nightly to reduce daytime SE.

## 2023-11-28 DIAGNOSIS — Z79899 Other long term (current) drug therapy: Secondary | ICD-10-CM | POA: Diagnosis not present

## 2023-11-28 DIAGNOSIS — F319 Bipolar disorder, unspecified: Secondary | ICD-10-CM | POA: Diagnosis not present

## 2023-11-28 NOTE — Telephone Encounter (Signed)
Patient notified of recommendation.

## 2023-11-28 NOTE — Telephone Encounter (Signed)
 Prior authorization with [employer] for Focalin XR 40 mg DENIED, she has to try and fail the formularies or have supporting medical reasons why she can not try them.   Options are: Methylphenidate XR, Amphetamine-Dextroamphetamine ER, Dextroamphetamine Sulfate ER.

## 2023-11-29 ENCOUNTER — Encounter: Payer: Self-pay | Admitting: Psychiatry

## 2023-11-29 DIAGNOSIS — M419 Scoliosis, unspecified: Secondary | ICD-10-CM | POA: Diagnosis not present

## 2023-11-29 DIAGNOSIS — M546 Pain in thoracic spine: Secondary | ICD-10-CM | POA: Diagnosis not present

## 2023-11-29 DIAGNOSIS — M47814 Spondylosis without myelopathy or radiculopathy, thoracic region: Secondary | ICD-10-CM | POA: Diagnosis not present

## 2023-12-12 ENCOUNTER — Ambulatory Visit: Payer: PPO | Admitting: Psychiatry

## 2023-12-12 DIAGNOSIS — F3132 Bipolar disorder, current episode depressed, moderate: Secondary | ICD-10-CM | POA: Diagnosis not present
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
## 2023-12-14 ENCOUNTER — Encounter: Payer: Self-pay | Admitting: Psychiatry

## 2023-12-14 ENCOUNTER — Ambulatory Visit (INDEPENDENT_AMBULATORY_CARE_PROVIDER_SITE_OTHER): Payer: No Typology Code available for payment source | Admitting: Psychiatry

## 2023-12-14 DIAGNOSIS — Z79899 Other long term (current) drug therapy: Secondary | ICD-10-CM

## 2023-12-14 DIAGNOSIS — Z636 Dependent relative needing care at home: Secondary | ICD-10-CM | POA: Diagnosis not present

## 2023-12-14 DIAGNOSIS — G2401 Drug induced subacute dyskinesia: Secondary | ICD-10-CM | POA: Diagnosis not present

## 2023-12-14 DIAGNOSIS — F411 Generalized anxiety disorder: Secondary | ICD-10-CM

## 2023-12-14 DIAGNOSIS — F3132 Bipolar disorder, current episode depressed, moderate: Secondary | ICD-10-CM | POA: Diagnosis not present

## 2023-12-14 DIAGNOSIS — G3184 Mild cognitive impairment, so stated: Secondary | ICD-10-CM | POA: Diagnosis not present

## 2023-12-14 DIAGNOSIS — F314 Bipolar disorder, current episode depressed, severe, without psychotic features: Secondary | ICD-10-CM | POA: Diagnosis not present

## 2023-12-14 DIAGNOSIS — F5105 Insomnia due to other mental disorder: Secondary | ICD-10-CM | POA: Diagnosis not present

## 2023-12-14 DIAGNOSIS — R7989 Other specified abnormal findings of blood chemistry: Secondary | ICD-10-CM | POA: Diagnosis not present

## 2023-12-14 DIAGNOSIS — F900 Attention-deficit hyperactivity disorder, predominantly inattentive type: Secondary | ICD-10-CM

## 2023-12-14 DIAGNOSIS — R251 Tremor, unspecified: Secondary | ICD-10-CM

## 2023-12-14 MED ORDER — DEXMETHYLPHENIDATE HCL 10 MG PO TABS: 20.0000 mg | ORAL_TABLET | Freq: Two times a day (BID) | ORAL | 0 refills | Status: DC

## 2023-12-14 NOTE — Progress Notes (Signed)
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 loss Mother    Hyperlipidemia Mother    Hypertension Mother    Depression Mother    Anxiety disorder Mother    Obesity Mother    Sudden death Mother     Hypertension Father    Diabetes Mellitus II Father    Heart disease Father    Arthritis Father    Cancer Father        Brain   COPD Father    Diabetes Father    Hyperlipidemia Father    Sleep apnea Father    Early death Sister        Aneroxia/Bulimic   Depression Brother    Early death Hydrographic surveyor Accident   Stroke Maternal Grandmother    Hypertension Maternal Grandmother    Arthritis Maternal Grandfather    Heart attack Maternal Grandfather    Hearing loss Maternal Grandfather    Depression Daughter    Drug abuse Daughter    Heart disease Daughter    Hypertension Daughter    Colon cancer Neg Hx    Esophageal cancer Neg Hx    Rectal cancer Neg Hx    Colon polyps Neg Hx    Stomach cancer Neg Hx     Social History   Socioeconomic History   Marital status: Married    Spouse name: Not on file   Number of children: 1   Years of education: 12   Highest education level: Not on file  Occupational History   Occupation: retired  Tobacco Use   Smoking status: Never   Smokeless tobacco: Never  Vaping Use   Vaping status: Never Used  Substance and Sexual Activity   Alcohol use: Not Currently    Alcohol/week: 1.0 standard drink of alcohol    Types: 1 Glasses of wine per week    Comment: 1 glass wine occassionally   Drug use: No   Sexual activity: Yes  Other Topics Concern   Not on file  Social History Narrative   Pt lives in [place] with husband [name].  Followed by Dr. Jennelle Human for psychiatry and Rockne Menghini for therapy.   Right handed   Drinks caffeine   One story home   Married lives with husband   retired   Chief Executive Officer Drivers of Corporate investment banker Strain: Low Risk  (06/01/2023)   Overall Financial Resource Strain (CARDIA)    Difficulty of Paying Living Expenses: Not hard at all  Food Insecurity: No Food Insecurity (06/22/2023)   Hunger Vital Sign    Worried About Running Out of Food in the Last Year: Never true    Ran Out of Food in the Last  Year: Never true  Transportation Needs: No Transportation Needs (06/22/2023)   PRAPARE - Administrator, Civil Service (Medical): No    Lack of Transportation (Non-Medical): No  Physical Activity: Inactive (06/01/2023)   Exercise Vital Sign    Days of Exercise per Week: 0 days    Minutes of Exercise per Session: 0 min  Stress: Stress Concern Present (06/01/2023)   [employer] of Occupational Health - Occupational Stress Questionnaire    Feeling of Stress : Very much  Social Connections: Socially Integrated (06/01/2023)   Social Connection and Isolation Panel [NHANES]    Frequency of Communication with Friends and Family: More than three times a week  Frequency of Social Gatherings with Friends and Family: More than three times a week    Attends Religious Services: More than 4 times per year    Active Member of [employer] or Organizations: Yes    Attends Banker Meetings: More than 4 times per year    Marital Status: Married  Catering manager Violence: Not At Risk (06/01/2023)   Humiliation, Afraid, Rape, and Kick questionnaire    Fear of Current or Ex-Partner: No    Emotionally Abused: No    Physically Abused: No    Sexually Abused: No    Past Medical History, Surgical history, Social history, and Family history were reviewed and updated as appropriate.   Please see review of systems for further details on the patient's review from today.   Objective:   Physical Exam:  There were no vitals taken for this visit.  Physical Exam Neurological:     Mental Status: She is alert and oriented to person, place, and time.     Cranial Nerves: No dysarthria.     Gait: Gait normal.     Comments: Lip licking consistent, and some pursing. Good gait  Psychiatric:        Attention and Perception: Attention and perception normal.        Mood and Affect: Mood is anxious and depressed. Affect is not tearful.        Speech: Speech normal.        Behavior: Behavior is  not slowed. Behavior is cooperative.        Thought Content: Thought content normal. Thought content is not paranoid or delusional. Thought content does not include homicidal or suicidal ideation. Thought content does not include suicidal plan.        Cognition and Memory: Cognition and memory normal.        Judgment: Judgment normal.     Comments: Insight intact Ongoing residual dep, irritability, anxiety with dep worst     Lab Review:     Component Value Date/Time   NA 139 08/02/2023 1214   NA 142 03/22/2023 0916   K 4.2 08/02/2023 1214   CL 106 08/02/2023 1214   CO2 26 08/02/2023 1214   GLUCOSE 101 (H) 08/02/2023 1214   BUN 17 08/02/2023 1214   BUN 12 03/22/2023 0916   CREATININE 0.85 08/02/2023 1214   CALCIUM 9.8 08/02/2023 1214   PROT 7.2 03/22/2023 0916   ALBUMIN 4.5 03/22/2023 0916   AST 27 03/22/2023 0916   ALT 14 03/22/2023 0916   ALKPHOS 68 03/22/2023 0916   BILITOT 0.7 03/22/2023 0916   GFRNONAA >60 08/02/2023 1214   GFRAA 96 03/02/2020 1433       Component Value Date/Time   WBC 8.0 08/02/2023 1214   RBC 4.29 08/02/2023 1214   HGB 13.2 08/02/2023 1214   HGB 14.8 03/22/2023 0916   HCT 39.8 08/02/2023 1214   HCT 47.3 (H) 03/22/2023 0916   PLT 188 08/02/2023 1214   PLT 225 03/22/2023 0916   MCV 92.8 08/02/2023 1214   MCV 92 03/22/2023 0916   MCH 30.8 08/02/2023 1214   MCHC 33.2 08/02/2023 1214   RDW 14.8 08/02/2023 1214   RDW 13.8 03/22/2023 0916   LYMPHSABS 3.4 (H) 03/22/2023 0916   MONOABS 0.7 04/04/2019 1004   EOSABS 0.2 03/22/2023 0916   BASOSABS 0.0 03/22/2023 0916  Vitamin D level 33 on 10K units daily on 12/4/`9 Increased to prescription vitamin d 50K units Monday, Wed, Friday.  Rx sent in.  Lithium Lvl  Date Value Ref Range Status  10/21/2018 0.18 (L) 0.60 - 1.20 mmol/L Final    Comment:    Performed at [facility], [address], [place], [place] [address]     No results found for: "PHENYTOIN", "PHENOBARB", "VALPROATE", "CBMZ"    .res Assessment: Plan:    Bipolar disorder with moderate depression (HCC)  Generalized anxiety disorder  Attention deficit hyperactivity disorder (ADHD), predominantly inattentive type  Tardive dyskinesia  Mild cognitive impairment  Insomnia due to mental condition  Long term current use of clozapine  Tremor of both hands  Low vitamin D level  Caregiver stress  30 min face to face time with patient was spent on counseling and coordination of care. We discussed multiple dxes and concerns.   Bernie has chronic rapid cycling bipolar disorder which is chronically unstable and has been difficult to control.  Failed 14 different mood stabilizers.  The rapid cycling is making it difficult to control frequency of depressive episodes and the anxiety as well.  We have typically had to make frequent med changes.   Disc gradual increase bc med sensitivity.  Med sensitivity to SE seems to be the biggest problem preventing better mood control  Disc options for better control of dep, irritability and anxiety.  Incr Viibryd vs clozapine  Ingrezza for TD helped reduce tongue chewing but still some mouth movements at 60 mg BID  for lip licking and pursing.  Shuffling only at night now.  Drooling resolved  Split ingrezza but increase bc TD worse lately to 60 mg BID Consider Austedo.  Disc cost concerns  .  Getting grant but that might end.  Need to pick insurance that will cover it.  Disc Coverage app.  Disc ECT. Only FDA approved option left. She wants to defer.    Extensive discussion of clozapine dosing recommendations but we will increase more slowly bc she is so med sensitive.Disc risk low WBC, cardiomyopathy, etc, sedation  (Started clozapine on 12/07/21).    Check CBC with diff every 4 weeks.  Now.  Option increase clozapine for anxiety.  Disc SR risk.   For anxiety and irritability, reactivity , mood swings continue clozapine but switch to 75 mg HS.  She coouldn't tolerate higher dose  and so mood swings are worse.   Likely to work better than incr Viibryd but consider latter.  She has a high residual anxiety.  It has been impossible to control all of her symptoms simultaneously without causing side effects. Failed various meds.  Discussed side effects of each medicine. Ok with Increase  Focalin IR 20 mg BID  for ADD and off label for depression . But  She is forgetting second doses which makes her feel worse.   Try to spread this out if possible for mood.   Try to get Focalin XR to improve compliance 40 mg AM Discussed potential benefits, risks, and side effects of stimulants with patient to include increased heart rate, palpitations, insomnia, increased anxiety, increased irritability, or decreased appetite.  Instructed patient to contact office if experiencing any significant tolerability issues. Consider modafinil if necessary  Would like to avoid BZ if possible.  Lamotrigine 100 mg twice daily to try to reduce polypharmacy and so improve tolerability of clozapine.  consider reduction but not now bc depression.  Failed all reasonable alternatives for anxiety.  Option Auvelity.  She is under a lot of genuine stress.  Discussed potential metabolic side effects associated with atypical antipsychotics, as well as potential  risk for movement side effects. Advised pt to contact office if movement side effects occur.    Checked B12 folate bc memory complaints.  Normal B12 & folate on 05/25/22  Disc SE meds and this is heightened by the complication of necessary polypharmacy.  Continue counseling . It helps Rec exercise as it would help.  Take advantage of family support  Med changes: None.  Give Viibryd and lower dose clozapine more time.  Requires frequent FU DT chronic instability.  Wants to schedule monthly.  FU 4 weeks.   Meredith Staggers MD, DFAPA  Please see After Visit Summary for patient specific instructions.     Future Appointments  Date Time Provider  Department Center  12/20/2023 10:45 AM Mauri Reading, PT [facility]  12/25/2023 10:00 AM Mathis Fare, LCSW [facility] None  01/08/2024 10:00 AM Mathis Fare, LCSW [facility] None  01/09/2024 10:00 AM Cottle, Steva Ready., MD [facility] None  01/30/2024 10:00 AM Mathis Fare, LCSW [facility] None  02/06/2024 10:30 AM Cottle, Steva Ready., MD [facility] None  02/13/2024 10:00 AM Mathis Fare, LCSW [facility] None  02/27/2024 10:00 AM Mathis Fare, LCSW [facility] None  03/12/2024 10:30 AM Cottle, Steva Ready., MD CP-[facility] None  03/14/2024 10:00 AM Mathis Fare, LCSW [facility] None  03/27/2024  2:40 PM Anabel Halon, MD [facility]  06/05/2024 10:40 AM RPC-ANNUAL WELLNESS VISIT [facility]    No orders of the defined types were placed in this encounter.      -------------------------------

## 2023-12-19 ENCOUNTER — Telehealth: Payer: Self-pay

## 2023-12-19 NOTE — Telephone Encounter (Signed)
 Prior Authorization initiated by [employer] for [employer] 60 mg questions answered and submitted with [employer]

## 2023-12-20 ENCOUNTER — Ambulatory Visit

## 2023-12-20 NOTE — Telephone Encounter (Signed)
 She's been on 60 mg BID for a year.  So they decided not to cover it now?  She has an appt with me in 3 weeks.  We can give her samples if necessary to get he rto appt.  I need to discuss this with her in person if we need to change it.

## 2023-12-20 NOTE — Telephone Encounter (Signed)
 Prior Approval received for Ingrezza 60 mg #60 effective through 12/18/24. [place] pharmacy is notified as well as pt.

## 2023-12-20 NOTE — Telephone Encounter (Signed)
 Update on [place] for Ingrezza 60 mg bid #60, [employer] later sent a fax that they are denying the quantity of #60 and the first fax was only approval for the medication itself. Contacted [employer] and we can submit a letter of medical necessity and try it that way. Per Medicare regulations along with FDA it is not approved for 120 mg daily.   Did not contact pt with an update at this time.

## 2023-12-21 NOTE — Therapy (Signed)
 OUTPATIENT PHYSICAL THERAPY THORACOLUMBAR EVALUATION   Patient Name: Megan Whitney MRN: [id number] DOB:[birth year], 68 y.o., female Today's Date: 12/26/2023  END OF SESSION:  PT End of Session - 12/26/23 1107     Visit Number 1    Number of Visits 16    Date for PT Re-Evaluation 02/20/24    Authorization Type [employer] PPO MCR    PT Start Time 1103    PT Stop Time 1145    PT Time Calculation (min) 42 min    Activity Tolerance Patient tolerated treatment well    Behavior During Therapy WFL for tasks assessed/performed             Past Medical History:  Diagnosis Date   ADD (attention deficit disorder)    Allergy    Seasonal   Anemia    History of GI blood loss   Anxiety    Arthritis    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    Esophageal stricture    Fracture of superior pubic ramus (HCC) 11/28/2018   GERD (gastroesophageal reflux disease)    Headache(784.0)    Hyperlipidemia    Interstitial cystitis    Joint pain    Lactose intolerance    Lung cancer (HCC) 2002   Neuromuscular disorder (HCC)    Obesity    Osteoarthritis    Osteoporosis    Palpitations    Sleep apnea    Doesn't use a CPAP   Suicidal ideation 01/20/2020   Swallowing difficulty    Tardive dyskinesia    Past Surgical History:  Procedure Laterality Date   BALLOON DILATION  05/16/2012   Procedure: BALLOON DILATION;  Surgeon: Claudette Cue, MD;  Location: [facility] ENDOSCOPY;  Service: Endoscopy;  Laterality: N/A;   BUNIONECTOMY  2011   COLONOSCOPY     ENTEROSCOPY  05/16/2012   Procedure: ENTEROSCOPY;  Surgeon: Claudette Cue, MD;  Location: [facility] ENDOSCOPY;  Service: Endoscopy;  Laterality: N/A;   JOINT REPLACEMENT     right shoulder durgery 25 yrs ago  1988   TOTAL HIP ARTHROPLASTY Bilateral 2006, 2008   bilateral   TUBAL  LIGATION  1990   WEDGE RESECTION  2002   lung cancer   Patient Active Problem List   Diagnosis Date Noted   Neuromuscular disorder (HCC)    Attention deficit disorder 09/27/2023   Prediabetes 09/27/2023   DDD (degenerative disc disease), lumbar 03/27/2023   Class 1 obesity due to excess calories without serious comorbidity in adult 11/29/2022   Chronic thoracic back pain 09/22/2022   Neuroleptic-induced tardive dyskinesia 07/20/2022   Intertrigo 05/19/2022   Encounter for general adult medical examination with abnormal findings 03/22/2022   Arthritis of knee 08/17/2021   Polyp of colon 02/04/2021   Seasonal allergies 10/07/2020   Osteopenia 10/07/2020   Gait disturbance 10/07/2020   Hyperlipidemia 06/11/2020   Sleep apnea 06/11/2020   Stricture and stenosis of esophagus 05/16/2012   Hiatal hernia 05/16/2012   Dysphagia 05/15/2012   Depression    Bipolar disorder (HCC)    GERD (gastroesophageal reflux disease) 09/14/2010   History of colonic polyps 09/14/2010    PCP: Meldon Sport, MD   REFERRING PROVIDER: Mort Ards, MD   REFERRING DIAG:   Scoliosis deformity of spine  Thoracic back pain  Thoracic spondylosis  Rationale for Evaluation and Treatment: Rehabilitation  THERAPY DIAG:  Chronic bilateral thoracic back pain - Plan: PT plan of care cert/re-cert  Chronic low back pain without sciatica, unspecified back pain laterality - Plan: PT plan of care cert/re-cert  Muscle weakness (generalized) - Plan: PT plan of care cert/re-cert  Abnormal posture - Plan: PT plan of care cert/re-cert  ONSET DATE: Winter 2024 pain  SUBJECTIVE:                                                                                                                                                                                           SUBJECTIVE STATEMENT: I have scoliosis and I need strengthening in my core.  I  thought I was only going to do aquatics. I presently do not have  pain but I have had pain especially early last year.  I am worried about the sharp pains that I have in my back and when I vaccuum and mop. I can stand < 5 minutes especially later in the day when I am tired. No issues with sleeping. Anything later in the afternoon I have pain sitting/driving, standing longer than 5 minutes  I can walk for 10 minutes and then pain.Pain limits me from socializing and my husband currently has terminal cancer. I am shaky from medication, Pt does not report doing any consistent exercise.  Pt has been increasing pain since last year.    PERTINENT HISTORY:  Hx of bipolar disorder, anxiety,ADD, Osteopenia, OSA, pelvic fx 2020, tripping, GERD, tardive dyskinesia, Bil THR  PAIN:  Are you having pain? Yes: NPRS scale: at rest 0/10 at worst 9/10 and later in day Pain location: thoracic and low back and especially on Right Pain description: aching Aggravating factors: standing later in day. Household chores. Bending over to get laundry, mopping and vacuuming outside changing the bird feeder with 5lb -10 lb bag Relieving factors: heating pad, medication once in a while   PRECAUTIONS: None  RED FLAGS: None   WEIGHT BEARING RESTRICTIONS: No  FALLS:  Has patient fallen in last 6 months? No  LIVING ENVIRONMENT: Lives with: lives with their spouse Lives in: House/apartment Stairs:  steps outside and a ramp Has following equipment at home: None  OCCUPATION: retired  PLOF: Independent  PATIENT GOALS: Pt would like to be able to be stronger  NEXT MD VISIT: TBD  OBJECTIVE:  Note: Objective measures were completed at Evaluation unless otherwise noted.  DIAGNOSTIC FINDINGS:  See medical record Lumbar radiographs from 2024 shows mild curvature of the spine, mild multilevel degenerative changes and grade 1 anterolisthesis of L4 on L5  PATIENT  SURVEYS:  Modified Oswestry 52   COGNITION: Overall cognitive status: Within functional limits for tasks  assessed     SENSATION: WFL has tardive dyskinesia  MUSCLE LENGTH: Hamstrings: Right bil tightness   POSTURE: rounded shoulders, decreased lumbar lordosis, increased thoracic kyphosis, weight shift left, and scoliotic curve C curve ot left Left convex side  PALPATION: TTP over thoracic right spine  LUMBAR ROM:   *did not test forward flexion due to osteopenia /self report of osteoporosis  AROM eval  Flexion  flexed at 20 degrees in stance  Extension  Only to neutral  Right lateral flexion 50%  Left lateral flexion 75% limited *  Right rotation   Left rotation   Key: WFL = within functional limits not formally assessed, * = concordant pain, s = stiffness/stretching sensation, NT = not tested)     LOWER EXTREMITY ROM:     Active  Right eval Left eval  Hip flexion 4 4  Hip extension 4- 4-  Hip abduction 4- 3+  Hip adduction    Hip internal rotation    Hip external rotation    Knee flexion 4 5  Knee extension 5 5  Ankle dorsiflexion 4 4  Ankle plantarflexion    Ankle inversion    Ankle eversion    Key: WFL = within functional limits not formally assessed, * = concordant pain, s = stiffness/stretching sensation, NT = not tested)     LOWER EXTREMITY MMT:    MMT Right eval Left eval  Hip flexion 5 4  Hip extension 4- 4-  Hip abduction 4- 4-  Hip adduction    Hip internal rotation    Hip external rotation    Knee flexion 4 5  Knee extension 5 5  Ankle dorsiflexion 4 4  Ankle plantarflexion    Ankle inversion    Ankle eversion    (Blank rows = not tested, score listed is out of 5 possible points.  N = WNL, D = diminished, C = clear for gross weakness with myotome testing, * = concordant pain with testing)   LUMBAR SPECIAL TESTS:  Straight leg raise test: Negative and Slump test: Negative  FUNCTIONAL TESTS:  5 times sit to stand: 19.47 2 minute walk test: 351 ft   Single leg stance  LT 2 sec  RT 0 sec GAIT: Distance walked: 351 ft Assistive device  utilized: None Level of assistance: Complete Independence Comments: Pt leans to left while walking,   TREATMENT DATE: eval and issue HEP  Aquatic exercise therapy information                                                                                                                                 PATIENT EDUCATION:  Education details: POC Explanation of findings HEP issue Aquatic therapy information Person educated: Patient Education method: Explanation, Demonstration, Tactile cues, Verbal cues, and Handouts Education comprehension: verbalized understanding, returned demonstration, verbal cues required, tactile  cues required, and needs further education  HOME EXERCISE PROGRAM: Access Code: [email or web address] URL: [email or web address] Date: 12/26/2023 Prepared by: Sharlet Dawson  Exercises - Supine Pelvic Tilt  - 1 x daily - 7 x weekly - 3 sets - 10 reps - Supine Single Knee to Chest Stretch  - 2 x daily - 7 x weekly - 1 sets - 5 reps - 10 hold - Supine Lower Trunk Rotation  - 1 x daily - 7 x weekly - 3 sets - 10 reps - Sit to stand with sink support Movement snack  - 1 x daily - 7 x weekly - 3 sets - 10 reps  ASSESSMENT:  CLINICAL IMPRESSION: Patient is a 68 y.o. female who was seen today for physical therapy evaluation and treatment for thoracic and lumbar pain due to scoliosis and sedentary lifestyle. Pt does not report any routine exercise and does not walk more than 5 minutes. Pt is caring for terminally ill husband and needs to improve strength, and pain control in order to perform household duties and caring for husband. Pt will also benefit from aquatic therapy and skilled PT to improve impairments   OBJECTIVE IMPAIRMENTS: decreased activity tolerance, decreased mobility, difficulty walking, decreased ROM, decreased strength, impaired flexibility, improper body mechanics, postural dysfunction, obesity, pain, and scoliosis C curve  .   ACTIVITY LIMITATIONS:  carrying, lifting, bending, sitting, standing, squatting, stairs, locomotion level, and caring for others  PARTICIPATION LIMITATIONS: meal prep, cleaning, laundry, driving, and community activity  PERSONAL FACTORS: Hx of bipolar disorder, anxiety,ADD, Osteopenia, OSA, pelvic fx 2020, tripping, GERD, tardive dyskinesia, Bil THR are also affecting patient's functional outcome.   REHAB POTENTIAL: Good  CLINICAL DECISION MAKING: Evolving/moderate complexity  EVALUATION COMPLEXITY: Moderate   GOALS: Goals reviewed with patient? Yes  SHORT TERM GOALS: Target date: 01-23-24  Pt will demonstrate appropriate understanding and performance of initially prescribed HEP in order to facilitate improved independence with management of symptoms Baseline: Goal status: INITIAL  2.  Pt will perform 5xSTS in <13.0 sec in order to demonstrate reduced fall risk and improved functional independence. (MCID of 2.3sec) Baseline: 19.47 sec Goal status: INITIAL  3.  Demonstrate understanding of elongation of spine in sitting posture and be more conscious of position and posture throughout the day.  Baseline: Left C Curve scoliorsis Goal status: INITIAL  4.  Begin Aquatic therapy for management of movement and pain control Baseline: No knowledge Goal status: INITIAL   LONG TERM GOALS: Target date: 02-20-24  Pt will be I with advanced HEP Baseline: no knowledge Goal status: INITIAL  2.  Demonstrate and verbalize techniques to reduce the risk of re-injury including: lifting, posture, body mechanics mindful of scoliosis/  Baseline:  Goal status: INITIAL  3.  [place] will show a >/= 12 pt improvement in their ODI score (MCID is 12 pts) as a proxy for functional improvement Baseline: 52 % Goal status: INITIAL  4.  Pt will demonstrate increase lumbar and thoracic strength by being able to carry 5- 10 lb bird seed bag without exacerbating thoracic /lumbar pain greater than 3/10 Baseline: at worst 9/10 Goal  status: INITIAL  5.  Pt will be able to perform 6 MWT within normal limits for age 42 ft to 1905 ft Baseline: 351 ft Goal status: INITIAL  6.  Pt will improve endurance by walking at least 20 minutes 3 times a week Baseline: No current routine exercise. Does not walk greater than 5 min Goal status: INITIAL  PLAN:  PT FREQUENCY:  1-2x/week  PT DURATION: 8 weeks  PLANNED INTERVENTIONS: 97164- PT Re-evaluation, 97750- Physical Performance Testing, 97110-Therapeutic exercises, 97530- Therapeutic activity, [id number]- Neuromuscular re-education, 97535- Self Care, 74259- Manual therapy, [phone]- Gait training, [phone]- Aquatic Therapy, [phone]- Electrical stimulation (manual), Patient/Family education, Balance training, Stair training, Taping, Dry Needling, Joint mobilization, Spinal mobilization, Cryotherapy, and Moist heat.  PLAN FOR NEXT SESSION: issue HEP, TPDN as needed for thoracic pain   Sharlet Dawson, PT, ATRIC Certified Exercise Expert for the Aging Adult  12/26/23 4:36 PM Phone: [phone] Fax: [phone]

## 2023-12-22 ENCOUNTER — Encounter: Payer: Self-pay | Admitting: Internal Medicine

## 2023-12-25 ENCOUNTER — Ambulatory Visit: Payer: PPO | Admitting: Psychiatry

## 2023-12-25 DIAGNOSIS — F3132 Bipolar disorder, current episode depressed, moderate: Secondary | ICD-10-CM | POA: Diagnosis not present
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
## 2023-12-26 ENCOUNTER — Encounter: Payer: Self-pay | Admitting: Physical Therapy

## 2023-12-26 ENCOUNTER — Other Ambulatory Visit: Payer: Self-pay

## 2023-12-26 ENCOUNTER — Ambulatory Visit: Attending: Orthopedic Surgery | Admitting: Physical Therapy

## 2023-12-26 ENCOUNTER — Other Ambulatory Visit: Payer: Self-pay | Admitting: Internal Medicine

## 2023-12-26 DIAGNOSIS — E7849 Other hyperlipidemia: Secondary | ICD-10-CM

## 2023-12-26 DIAGNOSIS — M6281 Muscle weakness (generalized): Secondary | ICD-10-CM

## 2023-12-26 DIAGNOSIS — M546 Pain in thoracic spine: Secondary | ICD-10-CM | POA: Diagnosis not present

## 2023-12-26 DIAGNOSIS — G8929 Other chronic pain: Secondary | ICD-10-CM

## 2023-12-26 DIAGNOSIS — M545 Low back pain, unspecified: Secondary | ICD-10-CM

## 2023-12-26 DIAGNOSIS — R293 Abnormal posture: Secondary | ICD-10-CM | POA: Diagnosis not present

## 2023-12-26 NOTE — Patient Instructions (Signed)
 Aquatic Therapy at [facility]-  What to Expect!  Where:   [facility] [address] [place], [place] [address] Rehab phone [phone]  NOTE:  You will receive an automated phone message reminding you of your appt and it will say the appointment is at the [address] [facility] clinic.          How to Prepare: Please make sure you drink 8 ounces of water about one hour prior to your pool session A caregiver may attend if needed with the patient to help assist as needed. A caregiver can sit in the pool room on chair. Please arrive IN YOUR SUIT and 15 minutes prior to your appointment - this helps to avoid delays in starting your session. Please make sure to attend to any toileting needs prior to entering the pool [place] rooms for changing are provided.   There is direct access to the pool deck form the locker room.  You can lock your belongings in a locker with lock provided. Once on the pool deck your therapist will ask if you have signed the Patient  Consent and Assignment of Benefits form before beginning treatment Your therapist may take your blood pressure prior to, during and after your session if indicated We usually try and create a home exercise program based on activities we do in the pool.  Please be thinking about who might be able to assist you in the pool should you need to participate in an aquatic home exercise program at the time of discharge if you need assistance.  Some patients do not want to or do not have the ability to participate in an aquatic home program - this is not a barrier in any way to you participating in aquatic therapy as part of your current therapy plan! After Discharge from PT, you can continue using home program at  the [facility], there is a drop-in fee for $5 ($45 a month)or for 60 years  or older $4.00 ($40 a month for seniors ) or any local [facility] pool.  Memberships for purchase are  available for gym/pool at [facility]  IT IS VERY IMPORTANT THAT YOUR LAST VISIT BE IN THE CLINIC AT [facility] [address] AFTER YOUR LAST AQUATIC VISIT.  PLEASE MAKE SURE THAT YOU HAVE A [address]  APPOINTMENT SCHEDULED.   About the pool: Pool is located approximately 500 FT from the entrance of the building.  Please bring a support person if you need assistance traveling this      distance.   Your therapist will assist you in entering the water; there are two ways to           enter: stairs with railings, and a mechanical lift. Your therapist will determine the most appropriate way for you.  Water temperature is usually between 88-90 degrees  There may be up to 2 other swimmers in the pool at the same time  The pool deck is tile, please wear shoes with good traction if you prefer not to be barefoot.    Contact Info:  For appointment scheduling and cancellations:         Please call the [facility]  [phone]              Aquatic Therapy  Outpatient Rehabilitation @ [facility]       All sessions are 45 minutes  Sharlet Dawson, PT, ATRIC Certified Exercise Expert for the Aging Adult  12/26/23 11:32 AM Phone: [phone] Fax: [phone]

## 2023-12-27 DIAGNOSIS — H04123 Dry eye syndrome of bilateral lacrimal glands: Secondary | ICD-10-CM | POA: Diagnosis not present

## 2024-01-01 DIAGNOSIS — F319 Bipolar disorder, unspecified: Secondary | ICD-10-CM | POA: Diagnosis not present

## 2024-01-01 DIAGNOSIS — Z79899 Other long term (current) drug therapy: Secondary | ICD-10-CM | POA: Diagnosis not present

## 2024-01-02 ENCOUNTER — Encounter: Payer: Self-pay | Admitting: Psychiatry

## 2024-01-03 ENCOUNTER — Ambulatory Visit: Admitting: Physical Therapy

## 2024-01-03 NOTE — Therapy (Signed)
 OUTPATIENT PHYSICAL THERAPY THORACOLUMBAR TREATMENT   Patient Name: Megan Whitney MRN: [id number] DOB:06-20-1956, 68 y.o., female Today's Date: 01/04/2024  END OF SESSION:  PT End of Session - 01/04/24 0925     Visit Number 1    Number of Visits 16    Date for PT Re-Evaluation 02/20/24    Authorization Type [employer] P[employer] MCR    PT Start Time 0930    PT Stop Time 1015    PT Time Calculation (min) 45 min    Activity Tolerance Patient tolerated treatment well    Behavior During Therapy WFL for tasks assessed/performed              Past Medical History:  Diagnosis Date   ADD (attention deficit disorder)    Allergy    Seasonal   Anemia    History of GI blood loss   Anxiety    Arthritis    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    Esophageal stricture    Fracture of superior pubic ramus (HCC) 11/28/2018   GERD (gastroesophageal reflux disease)    Headache(784.0)    Hyperlipidemia    Interstitial cystitis    Joint pain    Lactose intolerance    Lung cancer (HCC) 2002   Neuromuscular disorder (HCC)    Obesity    Osteoarthritis    Osteoporosis    Palpitations    Sleep apnea    Doesn't use a CPAP   Suicidal ideation 01/20/2020   Swallowing difficulty    Tardive dyskinesia    Past Surgical History:  Procedure Laterality Date   BALLOON DILATION  05/16/2012   Procedure: BALLOON DILATION;  Surgeon: Claudette Cue, MD;  Location: [facility] ENDOSCOPY;  Service: Endoscopy;  Laterality: N/A;   BUNIONECTOMY  2011   COLONOSCOPY     ENTEROSCOPY  05/16/2012   Procedure: ENTEROSCOPY;  Surgeon: Claudette Cue, MD;  Location: [facility] ENDOSCOPY;  Service: Endoscopy;  Laterality: N/A;   JOINT REPLACEMENT     right shoulder durgery 25 yrs ago  1988   TOTAL HIP ARTHROPLASTY Bilateral 2006, 2008   bilateral   TUBAL  LIGATION  1990   WEDGE RESECTION  2002   lung cancer   Patient Active Problem List   Diagnosis Date Noted   Neuromuscular disorder (HCC)    Attention deficit disorder 09/27/2023   Prediabetes 09/27/2023   DDD (degenerative disc disease), lumbar 03/27/2023   Class 1 obesity due to excess calories without serious comorbidity in adult 11/29/2022   Chronic thoracic back pain 09/22/2022   Neuroleptic-induced tardive dyskinesia 07/20/2022   Intertrigo 05/19/2022   Encounter for general adult medical examination with abnormal findings 03/22/2022   Arthritis of knee 08/17/2021   Polyp of colon 02/04/2021   Seasonal allergies 10/07/2020   Osteopenia 10/07/2020   Gait disturbance 10/07/2020   Hyperlipidemia 06/11/2020   Sleep apnea 06/11/2020   Stricture and stenosis of esophagus 05/16/2012   Hiatal hernia 05/16/2012   Dysphagia 05/15/2012   Depression    Bipolar disorder (HCC)    GERD (gastroesophageal reflux disease) 09/14/2010   History of colonic polyps 09/14/2010    PCP: Meldon Sport, MD   REFERRING PROVIDER: Mort Ards, MD   REFERRING DIAG:   Scoliosis deformity of spine  Thoracic back  pain  Thoracic spondylosis  Rationale for Evaluation and Treatment: Rehabilitation  THERAPY DIAG:  Chronic bilateral thoracic back pain  Abnormal posture  Muscle weakness (generalized)  Chronic low back pain without sciatica, unspecified back pain laterality  ONSET DATE: Winter 2024 pain  SUBJECTIVE:                                                                                                                                                                                           SUBJECTIVE STATEMENT: I am doing better and in fact it is 0/10 but by end of day it is a 0/10.     EVAL- I have scoliosis and I need strengthening in my core.  I  thought I was only going to do aquatics. I presently do not have pain but I have had pain especially early last year.  I am  worried about the sharp pains that I have in my back and when I vaccuum and mop. I can stand < 5 minutes especially later in the day when I am tired. No issues with sleeping. Anything later in the afternoon I have pain sitting/driving, standing longer than 5 minutes  I can walk for 10 minutes and then pain.Pain limits me from socializing and my husband currently has terminal cancer. I am shaky from medication, Pt does not report doing any consistent exercise.  Pt has been increasing pain since last year.    PERTINENT HISTORY:  Hx of bipolar disorder, anxiety,ADD, Osteopenia, OSA, pelvic fx 2020, tripping, GERD, tardive dyskinesia, Bil THR  PAIN:  Are you having pain? Yes: NPRS scale: at rest 0/10 at worst 9/10 and later in day Pain location: thoracic and low back and especially on Right Pain description: aching Aggravating factors: standing later in day. Household chores. Bending over to get laundry, mopping and vacuuming outside changing the bird feeder with 5lb -10 lb bag Relieving factors: heating pad, medication once in a while   PRECAUTIONS: None  RED FLAGS: None   WEIGHT BEARING RESTRICTIONS: No  FALLS:  Has patient fallen in last 6 months? No  LIVING ENVIRONMENT: Lives with: lives with their spouse Lives in: House/apartment Stairs:  steps outside and a ramp Has following equipment at home: None  OCCUPATION: retired  PLOF: Independent  PATIENT GOALS: Pt would like to be able to be stronger  NEXT MD VISIT: TBD  OBJECTIVE:  Note: Objective measures were completed at Evaluation unless otherwise noted.  DIAGNOSTIC FINDINGS:  See medical record Lumbar radiographs from 2024 shows mild curvature of the spine, mild multilevel degenerative changes and grade 1 anterolisthesis of L4 on L5  PATIENT SURVEYS:  Modified Oswestry 52   COGNITION: Overall cognitive status: Within functional limits for tasks assessed     SENSATION: WFL has tardive dyskinesia  MUSCLE  LENGTH: Hamstrings: Right bil tightness   POSTURE: rounded shoulders, decreased lumbar lordosis, increased thoracic kyphosis, weight shift left, and scoliotic curve C curve ot left Left convex side  PALPATION: TTP over thoracic right spine  LUMBAR ROM:   *did not test forward flexion due to osteopenia /self report of osteoporosis  AROM eval  Flexion  flexed at 20 degrees in stance  Extension  Only to neutral  Right lateral flexion 50%  Left lateral flexion 75% limited *  Right rotation   Left rotation   Key: WFL = within functional limits not formally assessed, * = concordant pain, s = stiffness/stretching sensation, NT = not tested)     LOWER EXTREMITY ROM:     Active  Right eval Left eval  Hip flexion 4 4  Hip extension 4- 4-  Hip abduction 4- 3+  Hip adduction    Hip internal rotation    Hip external rotation    Knee flexion 4 5  Knee extension 5 5  Ankle dorsiflexion 4 4  Ankle plantarflexion    Ankle inversion    Ankle eversion    Key: WFL = within functional limits not formally assessed, * = concordant pain, s = stiffness/stretching sensation, NT = not tested)     LOWER EXTREMITY MMT:    MMT Right eval Left eval  Hip flexion 5 4  Hip extension 4- 4-  Hip abduction 4- 4-  Hip adduction    Hip internal rotation    Hip external rotation    Knee flexion 4 5  Knee extension 5 5  Ankle dorsiflexion 4 4  Ankle plantarflexion    Ankle inversion    Ankle eversion    (Blank rows = not tested, score listed is out of 5 possible points.  N = WNL, D = diminished, C = clear for gross weakness with myotome testing, * = concordant pain with testing)   LUMBAR SPECIAL TESTS:  Straight leg raise test: Negative and Slump test: Negative  FUNCTIONAL TESTS:  5 times sit to stand: 19.47 2 minute walk test: 351 ft   Single leg stance  LT 2 sec  RT 0 sec   01-04-24  1154 ft (1500-1905 ft) GAIT: Distance walked: 351 ft Assistive device utilized: None Level of  assistance: Complete Independence Comments: Pt leans to left while walking,    [facility] Adult PT Treatment:                                                DATE: 01-04-24 Therapeutic Exercise: Supine pelvic tilt 20 x with VC and TC for correct execution STKC on Right and Left 5 x for 10 sec each LTR Bridge with ball 2 x 10 Star pattern Red T band 3 x 10 with chin tuck Chin tuck x 10 for 10 sec each   Therapeutic Activity: STS with 5 lb DB 3 x 10  1154 ft (1500-1905 ft)  Self Care: Walking program education   TREATMENT DATE: eval and issue HEP  Aquatic exercise therapy information  PATIENT EDUCATION:  Education details: POC Explanation of findings HEP issue Aquatic therapy information Person educated: Patient Education method: Explanation, Demonstration, Tactile cues, Verbal cues, and Handouts Education comprehension: verbalized understanding, returned demonstration, verbal cues required, tactile cues required, and needs further education  HOME EXERCISE PROGRAM: Access Code: [email or web address] URL: [email or web address] Date: 12/26/2023 Prepared by: Sharlet Dawson  Exercises - Supine Pelvic Tilt  - 1 x daily - 7 x weekly - 3 sets - 10 reps - Supine Single Knee to Chest Stretch  - 2 x daily - 7 x weekly - 1 sets - 5 reps - 10 hold - Supine Lower Trunk Rotation  - 1 x daily - 7 x weekly - 3 sets - 10 reps - Sit to stand with sink support Movement snack  - 1 x daily - 7 x weekly - 3 sets - 10 reps Added 01-04-24  Exercises - Supine Cervical Retraction with Towel  - 1 x daily - 7 x weekly - 1 sets - 10 reps - 10 sec hold - Standing Shoulder Diagonal Horizontal Abduction 60/120 Degrees with Resistance  - 1 x daily - 7 x weekly - 3 sets - 10 reps - Standing Shoulder Horizontal Abduction with Resistance  - 1 x daily - 7 x weekly - 3 sets - 10  reps  ASSESSMENT:  CLINICAL IMPRESSION: Pt returns to clinic with 0/10 pain and explaining that her husband had received bad news during his cancer treatment.  Pt is here today reinforcing and progressing HEP for increasing strength and endurance.  1154 ft (1500-1905 ft)  Pt educated on walking program starting with 12 minutes 2 x a day for start.  Will continue toward progression of goals.     EVAL-Patient is a 68 y.o. female who was seen today for physical therapy evaluation and treatment for thoracic and lumbar pain due to scoliosis and sedentary lifestyle. Pt does not report any routine exercise and does not walk more than 5 minutes. Pt is caring for terminally ill husband and needs to improve strength, and pain control in order to perform household duties and caring for husband. Pt will also benefit from aquatic therapy and skilled PT to improve impairments   OBJECTIVE IMPAIRMENTS: decreased activity tolerance, decreased mobility, difficulty walking, decreased ROM, decreased strength, impaired flexibility, improper body mechanics, postural dysfunction, obesity, pain, and scoliosis C curve  .   ACTIVITY LIMITATIONS: carrying, lifting, bending, sitting, standing, squatting, stairs, locomotion level, and caring for others  PARTICIPATION LIMITATIONS: meal prep, cleaning, laundry, driving, and community activity  PERSONAL FACTORS: Hx of bipolar disorder, anxiety,ADD, Osteopenia, OSA, pelvic fx 2020, tripping, GERD, tardive dyskinesia, Bil THR are also affecting patient's functional outcome.   REHAB POTENTIAL: Good  CLINICAL DECISION MAKING: Evolving/moderate complexity  EVALUATION COMPLEXITY: Moderate   GOALS: Goals reviewed with patient? Yes  SHORT TERM GOALS: Target date: 01-23-24  Pt will demonstrate appropriate understanding and performance of initially prescribed HEP in order to facilitate improved independence with management of symptoms Baseline: Goal status: INITIAL  2.   Pt will perform 5xSTS in <13.0 sec in order to demonstrate reduced fall risk and improved functional independence. (MCID of 2.3sec) Baseline: 19.47 sec Goal status: INITIAL  3.  Demonstrate understanding of elongation of spine in sitting posture and be more conscious of position and posture throughout the day.  Baseline: Left C Curve scoliorsis Goal status: INITIAL  4.  Begin Aquatic therapy for management of movement and pain control Baseline: No knowledge Goal status: INITIAL   LONG  TERM GOALS: Target date: 02-20-24  Pt will be I with advanced HEP Baseline: no knowledge Goal status: INITIAL  2.  Demonstrate and verbalize techniques to reduce the risk of re-injury including: lifting, posture, body mechanics mindful of scoliosis/  Baseline:  Goal status: INITIAL  3.  Myrian will show a >/= 12 pt improvement in their ODI score (MCID is 12 pts) as a proxy for functional improvement Baseline: 52 % Goal status: INITIAL  4.  Pt will demonstrate increase lumbar and thoracic strength by being able to carry 5- 10 lb bird seed bag without exacerbating thoracic /lumbar pain greater than 3/10 Baseline: at worst 9/10 Goal status: INITIAL  5.  Pt will be able to perform 6 MWT within normal limits for age 69 ft to 1905 ft Baseline: 351 ft Goal status: INITIAL  6.  Pt will improve endurance by walking at least 20 minutes 3 times a week Baseline: No current routine exercise. Does not walk greater than 5 min Goal status: INITIAL  PLAN:  PT FREQUENCY: 1-2x/week  PT DURATION: 8 weeks  PLANNED INTERVENTIONS: 97164- PT Re-evaluation, 97750- Physical Performance Testing, 97110-Therapeutic exercises, 97530- Therapeutic activity, [id number]- Neuromuscular re-education, 97535- Self Care, 21308- Manual therapy, [phone]- Gait training, [phone]- Aquatic Therapy, [phone]- Electrical stimulation (manual), Patient/Family education, Balance training, Stair training, Taping, Dry Needling, Joint mobilization,  Spinal mobilization, Cryotherapy, and Moist heat.  PLAN FOR NEXT SESSION: issue HEP, TPDN as needed for thoracic pain   Sharlet Dawson, PT, [facility] Certified Exercise Expert for the Aging Adult  01/04/24 10:19 AM Phone: [phone] Fax: [phone]

## 2024-01-04 ENCOUNTER — Encounter: Payer: Self-pay | Admitting: Physical Therapy

## 2024-01-04 ENCOUNTER — Ambulatory Visit: Attending: Orthopedic Surgery | Admitting: Physical Therapy

## 2024-01-04 DIAGNOSIS — M545 Low back pain, unspecified: Secondary | ICD-10-CM | POA: Insufficient documentation

## 2024-01-04 DIAGNOSIS — M6281 Muscle weakness (generalized): Secondary | ICD-10-CM | POA: Diagnosis not present

## 2024-01-04 DIAGNOSIS — G8929 Other chronic pain: Secondary | ICD-10-CM | POA: Diagnosis not present

## 2024-01-04 DIAGNOSIS — R293 Abnormal posture: Secondary | ICD-10-CM | POA: Insufficient documentation

## 2024-01-04 DIAGNOSIS — M546 Pain in thoracic spine: Secondary | ICD-10-CM | POA: Diagnosis not present

## 2024-01-04 NOTE — Patient Instructions (Signed)
 WALKING  Walking is a great form of exercise to increase your strength, endurance and overall fitness.  A walking program can help you start slowly and gradually build endurance as you go.  Everyone's ability is different, so each person's starting point will be different.  You do not have to follow them exactly.  The are just samples. You should simply find out what's right for you and stick to that program.   In the beginning, you'll start off walking 2-3 times a day for short distances.  As you get stronger, you'll be walking further at just 1-2 times per day.  AMA states everyone should exercise at least 300 minutes a week  so being able to walk 30 to 45 minutes wihtout stopping about 5 x a week is minimum  A. You Can Walk For A Certain Length Of Time Each Day    Walk 5 minutes 3 times per day.  Increase 2 minutes every 2 days (3 times per day).  Work up to 25-30 minutes (1-2 times per day).   Example:   Day 1-2 5 minutes 3 times per day   Day 7-8 12 minutes 2-3 times per day   Day 13-14 25 minutes 1-2 times per day  B. You Can Walk For a Certain Distance Each Day     Distance can be substituted for time.    Example:   3 trips to mailbox (at road)   3 trips to corner of block   3 trips around the block  C. Go to local [employer] and use the track.    Walk for distance ____ around track  Or time ____ minutes  D. Walk ___x_ Jog ____ Run ___  Please only do the exercises that your therapist has initialed and dated   Sharlet Dawson, PT, [facility] Certified Exercise Expert for the Aging Adult  01/04/24 10:14 AM Phone: [phone] Fax: [phone]

## 2024-01-07 ENCOUNTER — Other Ambulatory Visit: Payer: Self-pay | Admitting: Psychiatry

## 2024-01-07 DIAGNOSIS — F3132 Bipolar disorder, current episode depressed, moderate: Secondary | ICD-10-CM

## 2024-01-08 ENCOUNTER — Ambulatory Visit: Payer: PPO | Admitting: Psychiatry

## 2024-01-09 ENCOUNTER — Encounter: Payer: Self-pay | Admitting: Psychiatry

## 2024-01-09 ENCOUNTER — Ambulatory Visit (INDEPENDENT_AMBULATORY_CARE_PROVIDER_SITE_OTHER): Payer: PPO | Admitting: Psychiatry

## 2024-01-09 DIAGNOSIS — F411 Generalized anxiety disorder: Secondary | ICD-10-CM | POA: Diagnosis not present

## 2024-01-09 DIAGNOSIS — Z79899 Other long term (current) drug therapy: Secondary | ICD-10-CM | POA: Diagnosis not present

## 2024-01-09 DIAGNOSIS — F5105 Insomnia due to other mental disorder: Secondary | ICD-10-CM

## 2024-01-09 DIAGNOSIS — F3132 Bipolar disorder, current episode depressed, moderate: Secondary | ICD-10-CM

## 2024-01-09 DIAGNOSIS — G2401 Drug induced subacute dyskinesia: Secondary | ICD-10-CM | POA: Diagnosis not present

## 2024-01-09 DIAGNOSIS — F900 Attention-deficit hyperactivity disorder, predominantly inattentive type: Secondary | ICD-10-CM

## 2024-01-09 DIAGNOSIS — G3184 Mild cognitive impairment, so stated: Secondary | ICD-10-CM

## 2024-01-09 MED ORDER — AUSTEDO XR 30 MG PO TB24: 30.0000 mg | ORAL_TABLET | Freq: Every day | ORAL | Status: DC

## 2024-01-09 NOTE — Progress Notes (Addendum)
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    Esophageal stricture    Fracture of superior pubic ramus (HCC) 11/28/2018   GERD (gastroesophageal reflux disease)    Headache(784.0)    Hyperlipidemia    Interstitial cystitis  Joint pain    Lactose intolerance    Lung cancer (HCC) 2002   Neuromuscular disorder (HCC)    Obesity    Osteoarthritis    Osteoporosis    Palpitations    Sleep apnea    Doesn't use a CPAP   Suicidal ideation 01/20/2020   Swallowing  difficulty    Tardive dyskinesia     Family History  Problem Relation Age of Onset   Arthritis Mother    Hearing loss Mother    Hyperlipidemia Mother    Hypertension Mother    Depression Mother    Anxiety disorder Mother    Obesity Mother    Sudden death Mother    Hypertension Father    Diabetes Mellitus II Father    Heart disease Father    Arthritis Father    Cancer Father        Brain   COPD Father    Diabetes Father    Hyperlipidemia Father    Sleep apnea Father    Early death Sister        Aneroxia/Bulimic   Depression Brother    Early death Hydrographic surveyor Accident   Stroke Maternal Grandmother    Hypertension Maternal Grandmother    Arthritis Maternal Grandfather    Heart attack Maternal Grandfather    Hearing loss Maternal Grandfather    Depression Daughter    Drug abuse Daughter    Heart disease Daughter    Hypertension Daughter    Colon cancer Neg Hx    Esophageal cancer Neg Hx    Rectal cancer Neg Hx    Colon polyps Neg Hx    Stomach cancer Neg Hx     Social History   Socioeconomic History   Marital status: Married    Spouse name: Not on file   Number of children: 1   Years of education: 12   Highest education level: Not on file  Occupational History   Occupation: retired  Tobacco Use   Smoking status: Never   Smokeless tobacco: Never  Vaping Use   Vaping status: Never Used  Substance and Sexual Activity   Alcohol use: Not Currently    Alcohol/week: 1.0 standard drink of alcohol    Types: 1 Glasses of wine per week    Comment: 1 glass wine occassionally   Drug use: No   Sexual activity: Yes  Other Topics Concern   Not on file  Social History Narrative   Pt lives in [place] with husband Sylvester Evert.  Followed by Dr. Toi Foster for psychiatry and Reid Capuchin for therapy.   Right handed   Drinks caffeine   One story home   Married lives with husband   retired   Chief Executive Officer Drivers of Corporate investment banker Strain: Low Risk  (06/01/2023)    Overall Financial Resource Strain (CARDIA)    Difficulty of Paying Living Expenses: Not hard at all  Food Insecurity: No Food Insecurity (06/22/2023)   Hunger Vital Sign    Worried About Running Out of Food in the Last Year: Never true    Ran Out of Food in the Last Year: Never true  Transportation Needs: No Transportation Needs (06/22/2023)   PRAPARE - Administrator, Civil Service (Medical): No    Lack of Transportation (Non-Medical): No  Physical Activity: Inactive (06/01/2023)   Exercise Vital Sign    Days of Exercise per Week: 0 days    Minutes of Exercise per Session:  0 min  Stress: Stress Concern Present (06/01/2023)   [employer] of Occupational Health - Occupational Stress Questionnaire    Feeling of Stress : Very much  Social Connections: Socially Integrated (06/01/2023)   Social Connection and Isolation Panel [NHANES]    Frequency of Communication with Friends and Family: More than three times a week    Frequency of Social Gatherings with Friends and Family: More than three times a week    Attends Religious Services: More than 4 times per year    Active Member of [employer] or Organizations: Yes    Attends Engineer, structural: More than 4 times per year    Marital Status: Married  Catering manager Violence: Not At Risk (06/01/2023)   Humiliation, Afraid, Rape, and Kick questionnaire    Fear of Current or Ex-Partner: No    Emotionally Abused: No    Physically Abused: No    Sexually Abused: No    Past Medical History, Surgical history, Social history, and Family history were reviewed and updated as appropriate.   Please see review of systems for further details on the patient's review from today.   Objective:   Physical Exam:  There were no vitals taken for this visit.  Physical Exam Neurological:     Mental Status: She is alert and oriented to person, place, and time.     Cranial Nerves: No dysarthria.     Gait: Gait normal.     Comments:  Lip licking consistent, and some pursing unchanged Good gait  Psychiatric:        Attention and Perception: Attention and perception normal.        Mood and Affect: Mood is anxious and depressed. Affect is not tearful.        Speech: Speech normal.        Behavior: Behavior is not slowed. Behavior is cooperative.        Thought Content: Thought content normal. Thought content is not paranoid or delusional. Thought content does not include homicidal or suicidal ideation. Thought content does not include suicidal plan.        Cognition and Memory: Cognition and memory normal.        Judgment: Judgment normal.     Comments: Insight intact Ongoing residual dep, irritability, anxiety with dep worst     Lab Review:     Component Value Date/Time   NA 139 08/02/2023 1214   NA 142 03/22/2023 0916   K 4.2 08/02/2023 1214   CL 106 08/02/2023 1214   CO2 26 08/02/2023 1214   GLUCOSE 101 (H) 08/02/2023 1214   BUN 17 08/02/2023 1214   BUN 12 03/22/2023 0916   CREATININE 0.85 08/02/2023 1214   CALCIUM  9.8 08/02/2023 1214   PROT 7.2 03/22/2023 0916   ALBUMIN 4.5 03/22/2023 0916   AST 27 03/22/2023 0916   ALT 14 03/22/2023 0916   ALKPHOS 68 03/22/2023 0916   BILITOT 0.7 03/22/2023 0916   GFRNONAA >60 08/02/2023 1214   GFRAA 96 03/02/2020 1433       Component Value Date/Time   WBC 8.0 08/02/2023 1214   RBC 4.29 08/02/2023 1214   HGB 13.2 08/02/2023 1214   HGB 14.8 03/22/2023 0916   HCT 39.8 08/02/2023 1214   HCT 47.3 (H) 03/22/2023 0916   PLT 188 08/02/2023 1214   PLT 225 03/22/2023 0916   MCV 92.8 08/02/2023 1214   MCV 92 03/22/2023 0916   MCH 30.8 08/02/2023 1214   MCHC 33.2 08/02/2023 1214  RDW 14.8 08/02/2023 1214   RDW 13.8 03/22/2023 0916   LYMPHSABS 3.4 (H) 03/22/2023 0916   MONOABS 0.7 04/04/2019 1004   EOSABS 0.2 03/22/2023 0916   BASOSABS 0.0 03/22/2023 0916  Vitamin D  level 33 on 10K units daily on 12/4/`9 Increased to prescription vitamin d  50K units Monday, Wed,  Friday.  Rx sent in.   Lithium  Lvl  Date Value Ref Range Status  10/21/2018 0.18 (L) 0.60 - 1.20 mmol/L Final    Comment:    Performed at [facility], [address], [place], [place] [address]     No results found for: "PHENYTOIN", "PHENOBARB", "VALPROATE", "CBMZ"   .res Assessment: Plan:    Bipolar disorder with moderate depression (HCC)  Generalized anxiety disorder  Attention deficit hyperactivity disorder (ADHD), predominantly inattentive type  Tardive dyskinesia - Plan: Deutetrabenazine ER (AUSTEDO XR) 30 MG TB24  Mild cognitive impairment  Insomnia due to mental condition  Long term current use of clozapine   30 min face to face time with patient was spent on counseling and coordination of care. We discussed multiple dxes and concerns.   Kesi has chronic rapid cycling bipolar disorder which is chronically unstable and has been difficult to control.  Failed 14 different mood stabilizers.  The rapid cycling is making it difficult to control frequency of depressive episodes and the anxiety as well.  We have typically had to make frequent med changes.   Disc gradual increase bc med sensitivity.  Med sensitivity to SE seems to be the biggest problem preventing better mood control  Disc options for better control of dep, irritability and anxiety.  Incr Viibryd  vs clozapine   Ingrezza  for TD helped reduce tongue chewing but still some mouth movements at 60 mg BID  for lip licking and pursing.  Shuffling only at night now.  Drooling resolved Stop ingrezza  DT insurance won't cover BID anymore. Switch to Austedo XR 30 mg daily in hopes also better response.  Call by 10 days to verify the dose or whether increase is needed.   Disc ECT. Only FDA approved option left. She wants to defer.    Extensive discussion of clozapine  dosing recommendations but we will increase more slowly bc she is so med sensitive.Disc risk low WBC, cardiomyopathy, etc, sedation  (Started clozapine  on  12/07/21).    Check CBC with diff every 4 weeks.  Now.  Option increase clozapine  for anxiety.  Disc SR risk.   For anxiety and irritability, reactivity , mood swings continue clozapine  but switch to 75 mg HS.  She coouldn't tolerate higher dose and so mood swings are worse.  Lately dealing with dep and anxiety but better the last couple of days. Likely to work better than incr Viibryd  but consider latter.  She has a high residual anxiety.  It has been impossible to control all of her symptoms simultaneously without causing side effects. Failed various meds.  Discussed side effects of each medicine. Ok with Increase  Focalin  IR 20 mg BID  for ADD and off label for depression . Try to spread this out if possible for mood.   Try to get Focalin  XR to improve compliance 40 mg AM Discussed potential benefits, risks, and side effects of stimulants with patient to include increased heart rate, palpitations, insomnia, increased anxiety, increased irritability, or decreased appetite.  Instructed patient to contact office if experiencing any significant tolerability issues. Consider modafinil if necessary  Would like to avoid BZ if possible.  Lamotrigine  100 mg twice daily to try  to reduce polypharmacy and so improve tolerability of clozapine .  consider reduction but not now bc depression.  Failed all reasonable alternatives for anxiety.  Option Auvelity.  She is under a lot of genuine stress.  Discussed potential metabolic side effects associated with atypical antipsychotics, as well as potential risk for movement side effects. Advised pt to contact office if movement side effects occur.    Checked B12 folate bc memory complaints.  Normal B12 & folate on 05/25/22  Disc SE meds and this is heightened by the complication of necessary polypharmacy.  Continue counseling . It helps Rec exercise as it would help.  Take advantage of family support  Med changes: Switch Ingrezza  to Austedo XR 30 DT costs  and hope for better response.  Requires frequent FU DT chronic instability.  Wants to schedule monthly.  FU 4 weeks.   Nori Beat MD, DFAPA  Please see After Visit Summary for patient specific instructions.     Future Appointments  Date Time Provider Department Center  01/11/2024  9:00 AM [name], LCSW [facility] None  01/11/2024  3:00 PM Alger Anthony [facility]  01/16/2024 10:15 AM Evern Hocking, PT [facility]  01/19/2024 10:15 AM Marquis Sitter, PT [facility]  01/30/2024 10:00 AM [name], LCSW [facility] None  02/06/2024 10:30 AM Cottle, Kennedy Peabody., MD [facility] None  02/13/2024 10:00 AM [name], LCSW [facility] None  02/27/2024 10:00 AM [name], LCSW [facility] None  03/12/2024 10:30 AM Cottle, Kennedy Peabody., MD [facility] None  03/14/2024 10:00 AM [name], LCSW [facility] None  03/27/2024  2:40 PM Meldon Sport, MD [facility]  03/28/2024 10:00 AM [name], LCSW [facility] None  06/05/2024 10:40 AM RPC-ANNUAL WELLNESS VISIT [facility]    No orders of the defined types were placed in this encounter.      -------------------------------

## 2024-01-09 NOTE — Patient Instructions (Signed)
 Stop Ingrezza  Start Austedo XR 30 mg daily. Call in 10 days to let us  know if the dosage seems right so we can send in a RX

## 2024-01-09 NOTE — Telephone Encounter (Signed)
 Pt now changed to Austedo XR

## 2024-01-10 ENCOUNTER — Encounter: Admitting: Physical Therapy

## 2024-01-10 NOTE — Therapy (Signed)
 OUTPATIENT PHYSICAL THERAPY THORACOLUMBAR TREATMENT   Patient Name: Megan Whitney MRN: [id number] DOB:July 14, 1956, 68 y.o., female Today's Date: 01/11/2024  END OF SESSION:  PT End of Session - 01/11/24 1506     Visit Number 2    Number of Visits 16    Date for PT Re-Evaluation 02/20/24    Authorization Type [employer] P[employer] MCR    PT Start Time 1502    PT Stop Time 1545    PT Time Calculation (min) 43 min    Activity Tolerance Patient tolerated treatment well    Behavior During Therapy WFL for tasks assessed/performed               Past Medical History:  Diagnosis Date   ADD (attention deficit disorder)    Allergy    Seasonal   Anemia    History of GI blood loss   Anxiety    Arthritis    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    Esophageal stricture    Fracture of superior pubic ramus (HCC) 11/28/2018   GERD (gastroesophageal reflux disease)    Headache(784.0)    Hyperlipidemia    Interstitial cystitis    Joint pain    Lactose intolerance    Lung cancer (HCC) 2002   Neuromuscular disorder (HCC)    Obesity    Osteoarthritis    Osteoporosis    Palpitations    Sleep apnea    Doesn't use a CPAP   Suicidal ideation 01/20/2020   Swallowing difficulty    Tardive dyskinesia    Past Surgical History:  Procedure Laterality Date   BALLOON DILATION  05/16/2012   Procedure: BALLOON DILATION;  Surgeon: Claudette Cue, MD;  Location: [facility] ENDOSCOPY;  Service: Endoscopy;  Laterality: N/A;   BUNIONECTOMY  2011   COLONOSCOPY     ENTEROSCOPY  05/16/2012   Procedure: ENTEROSCOPY;  Surgeon: Claudette Cue, MD;  Location: [facility] ENDOSCOPY;  Service: Endoscopy;  Laterality: N/A;   JOINT REPLACEMENT     right shoulder durgery 25 yrs ago  1988   TOTAL HIP ARTHROPLASTY Bilateral 2006, 2008   bilateral   TUBAL  LIGATION  1990   WEDGE RESECTION  2002   lung cancer   Patient Active Problem List   Diagnosis Date Noted   Neuromuscular disorder (HCC)    Attention deficit disorder 09/27/2023   Prediabetes 09/27/2023   DDD (degenerative disc disease), lumbar 03/27/2023   Class 1 obesity due to excess calories without serious comorbidity in adult 11/29/2022   Chronic thoracic back pain 09/22/2022   Neuroleptic-induced tardive dyskinesia 07/20/2022   Intertrigo 05/19/2022   Encounter for general adult medical examination with abnormal findings 03/22/2022   Arthritis of knee 08/17/2021   Polyp of colon 02/04/2021   Seasonal allergies 10/07/2020   Osteopenia 10/07/2020   Gait disturbance 10/07/2020   Hyperlipidemia 06/11/2020   Sleep apnea 06/11/2020   Stricture and stenosis of esophagus 05/16/2012   Hiatal hernia 05/16/2012   Dysphagia 05/15/2012   Depression    Bipolar disorder (HCC)    GERD (gastroesophageal reflux disease) 09/14/2010   History of colonic polyps 09/14/2010    PCP: Meldon Sport, MD   REFERRING PROVIDER: Mort Ards, MD   REFERRING DIAG:   Scoliosis deformity of spine  Thoracic  back pain  Thoracic spondylosis  Rationale for Evaluation and Treatment: Rehabilitation  THERAPY DIAG:  Chronic bilateral thoracic back pain  Abnormal posture  Muscle weakness (generalized)  Chronic low back pain without sciatica, unspecified back pain laterality  ONSET DATE: Winter 2024 pain  SUBJECTIVE:                                                                                                                                                                                           SUBJECTIVE STATEMENT: I am doing better and in fact it is 0/10  but yesterday at end of day it was an 8/10 and I used my heating pad. I want to know how to feel better with that.     EVAL- I have scoliosis and I need strengthening in my core.  I  thought I was only going to do aquatics. I  presently do not have pain but I have had pain especially early last year.  I am worried about the sharp pains that I have in my back and when I vaccuum and mop. I can stand < 5 minutes especially later in the day when I am tired. No issues with sleeping. Anything later in the afternoon I have pain sitting/driving, standing longer than 5 minutes  I can walk for 10 minutes and then pain.Pain limits me from socializing and my husband currently has terminal cancer. I am shaky from medication, Pt does not report doing any consistent exercise.  Pt has been increasing pain since last year.    PERTINENT HISTORY:  Hx of bipolar disorder, anxiety,ADD, Osteopenia, OSA, pelvic fx 2020, tripping, GERD, tardive dyskinesia, Bil THR  PAIN:  Are you having pain? Yes: NPRS scale: at rest 0/10 at worst 9/10 and later in day Pain location: thoracic and low back and especially on Right Pain description: aching Aggravating factors: standing later in day. Household chores. Bending over to get laundry, mopping and vacuuming outside changing the bird feeder with 5lb -10 lb bag Relieving factors: heating pad, medication once in a while   PRECAUTIONS: None  RED FLAGS: None   WEIGHT BEARING RESTRICTIONS: No  FALLS:  Has patient fallen in last 6 months? No  LIVING ENVIRONMENT: Lives with: lives with their spouse Lives in: House/apartment Stairs: steps outside and a ramp Has following equipment at home: None  OCCUPATION: retired  PLOF: Independent  PATIENT GOALS: Pt would like to be able to be stronger  NEXT MD VISIT: TBD  OBJECTIVE:  Note: Objective measures were completed at Evaluation unless otherwise noted.  DIAGNOSTIC FINDINGS:  See medical record Lumbar radiographs from 2024 shows mild curvature of  the spine, mild multilevel degenerative changes and grade 1 anterolisthesis of L4 on L5  PATIENT SURVEYS:  Modified Oswestry 52   COGNITION: Overall cognitive status: Within functional limits for  tasks assessed     SENSATION: WFL has tardive dyskinesia  MUSCLE LENGTH: Hamstrings: Right bil tightness   POSTURE: rounded shoulders, decreased lumbar lordosis, increased thoracic kyphosis, weight shift left, and scoliotic curve C curve ot left Left convex side  PALPATION: TTP over thoracic right spine  LUMBAR ROM:   *did not test forward flexion due to osteopenia /self report of osteoporosis  AROM eval  Flexion  flexed at 20 degrees in stance  Extension  Only to neutral  Right lateral flexion 50%  Left lateral flexion 75% limited *  Right rotation   Left rotation   Key: WFL = within functional limits not formally assessed, * = concordant pain, s = stiffness/stretching sensation, NT = not tested)     LOWER EXTREMITY ROM:     Active  Right eval Left eval  Hip flexion 4 4  Hip extension 4- 4-  Hip abduction 4- 3+  Hip adduction    Hip internal rotation    Hip external rotation    Knee flexion 4 5  Knee extension 5 5  Ankle dorsiflexion 4 4  Ankle plantarflexion    Ankle inversion    Ankle eversion    Key: WFL = within functional limits not formally assessed, * = concordant pain, s = stiffness/stretching sensation, NT = not tested)     LOWER EXTREMITY MMT:    MMT Right eval Left eval  Hip flexion 5 4  Hip extension 4- 4-  Hip abduction 4- 4-  Hip adduction    Hip internal rotation    Hip external rotation    Knee flexion 4 5  Knee extension 5 5  Ankle dorsiflexion 4 4  Ankle plantarflexion    Ankle inversion    Ankle eversion    (Blank rows = not tested, score listed is out of 5 possible points.  N = WNL, D = diminished, C = clear for gross weakness with myotome testing, * = concordant pain with testing)   LUMBAR SPECIAL TESTS:  Straight leg raise test: Negative and Slump test: Negative  FUNCTIONAL TESTS:  5 times sit to stand: 19.47 2 minute walk test: 351 ft   Single leg stance  LT 2 sec  RT 0 sec   01-04-24  1154 ft (1500-1905  ft) GAIT: Distance walked: 351 ft Assistive device utilized: None Level of assistance: Complete Independence Comments: Pt leans to left while walking,   [facility] Adult PT Treatment:                                                DATE: 01-11-24 Therapeutic Exercise: Supine pelvic tilt 20 x with VC and TC for correct execution STKC on Right and Left 5 x for 10 sec each Bridge with ball 2 x 10 LTR Star pattern Green T band 3 x 10 with chin tuck Chin tuck x 10 for 10 sec each Child's pose stretch forward , and to sides added to HEP  Therapeutic Activity: STS with 5 lb DB 2  x 10 Reverse lunge x 5 each side  Self Care: DOMs education and progressive strengthening and need for good nutrition and hydration  [facility] Adult  PT Treatment:                                                DATE: 01-04-24 Therapeutic Exercise: Supine pelvic tilt 20 x with VC and TC for correct execution STKC on Right and Left 5 x for 10 sec each LTR Bridge with ball 2 x 10 Star pattern Red T band 3 x 10 with chin tuck Chin tuck x 10 for 10 sec each   Therapeutic Activity: STS with 5 lb DB 3 x 10  1154 ft (1500-1905 ft)  Self Care: Walking program education   TREATMENT DATE: eval and issue HEP  Aquatic exercise therapy information                                                                                                                                 PATIENT EDUCATION:  Education details: POC Explanation of findings HEP issue Aquatic therapy information Person educated: Patient Education method: Explanation, Demonstration, Tactile cues, Verbal cues, and Handouts Education comprehension: verbalized understanding, returned demonstration, verbal cues required, tactile cues required, and needs further education  HOME EXERCISE PROGRAM: Access Code: [email or web address] URL: [email or web address] Date: 12/26/2023 Prepared by: Sharlet Dawson  Exercises - Supine Pelvic Tilt  - 1 x daily - 7 x  weekly - 3 sets - 10 reps - Supine Single Knee to Chest Stretch  - 2 x daily - 7 x weekly - 1 sets - 5 reps - 10 hold - Supine Lower Trunk Rotation  - 1 x daily - 7 x weekly - 3 sets - 10 reps - Sit to stand with sink support Movement snack  - 1 x daily - 7 x weekly - 3 sets - 10 reps Added 01-04-24  Exercises - Supine Cervical Retraction with Towel  - 1 x daily - 7 x weekly - 1 sets - 10 reps - 10 sec hold - Standing Shoulder Diagonal Horizontal Abduction 60/120 Degrees with Resistance  - 1 x daily - 7 x weekly - 3 sets - 10 reps - Standing Shoulder Horizontal Abduction with Resistance  - 1 x daily - 7 x weekly - 3 sets - 10 reps Added5-8-25 - childs pose stretch forward and to side  - 1 x daily - 7 x weekly - 1 sets - 6 reps - 15-20 sec hold ASSESSMENT:  CLINICAL IMPRESSION: Pt returns to clinic with 0/10 pain but did have 8/10 pain yesterday at end of day.  Exercise are improving pain level but still learning routine and incorporating more strengthening into program  Pt educated about DOMs and good nutrition and hydration to assist with progressive strengthening.HEP updated.  Check goals next visit.     EVAL-Patient is a 68 y.o. female who was seen today for physical therapy evaluation and treatment for thoracic  and lumbar pain due to scoliosis and sedentary lifestyle. Pt does not report any routine exercise and does not walk more than 5 minutes. Pt is caring for terminally ill husband and needs to improve strength, and pain control in order to perform household duties and caring for husband. Pt will also benefit from aquatic therapy and skilled PT to improve impairments   OBJECTIVE IMPAIRMENTS: decreased activity tolerance, decreased mobility, difficulty walking, decreased ROM, decreased strength, impaired flexibility, improper body mechanics, postural dysfunction, obesity, pain, and scoliosis C curve .   ACTIVITY LIMITATIONS: carrying, lifting, bending, sitting, standing, squatting, stairs,  locomotion level, and caring for others  PARTICIPATION LIMITATIONS: meal prep, cleaning, laundry, driving, and community activity  PERSONAL FACTORS: Hx of bipolar disorder, anxiety,ADD, Osteopenia, OSA, pelvic fx 2020, tripping, GERD, tardive dyskinesia, Bil THR are also affecting patient's functional outcome.   REHAB POTENTIAL: Good  CLINICAL DECISION MAKING: Evolving/moderate complexity  EVALUATION COMPLEXITY: Moderate   GOALS: Goals reviewed with patient? Yes  SHORT TERM GOALS: Target date: 01-23-24  Pt will demonstrate appropriate understanding and performance of initially prescribed HEP in order to facilitate improved independence with management of symptoms Baseline: Goal status: INITIAL  2.  Pt will perform 5xSTS in <13.0 sec in order to demonstrate reduced fall risk and improved functional independence. (MCID of 2.3sec) Baseline: 19.47 sec Goal status: INITIAL  3.  Demonstrate understanding of elongation of spine in sitting posture and be more conscious of position and posture throughout the day.  Baseline: Left C Curve scoliorsis Goal status: INITIAL  4.  Begin Aquatic therapy for management of movement and pain control Baseline: No knowledge Goal status: INITIAL   LONG TERM GOALS: Target date: 02-20-24  Pt will be I with advanced HEP Baseline: no knowledge Goal status: INITIAL  2.  Demonstrate and verbalize techniques to reduce the risk of re-injury including: lifting, posture, body mechanics mindful of scoliosis/  Baseline:  Goal status: INITIAL  3.  Caria will show a >/= 12 pt improvement in their ODI score (MCID is 12 pts) as a proxy for functional improvement Baseline: 52 % Goal status: INITIAL  4.  Pt will demonstrate increase lumbar and thoracic strength by being able to carry 5- 10 lb bird seed bag without exacerbating thoracic /lumbar pain greater than 3/10 Baseline: at worst 9/10 Goal status: INITIAL  5.  Pt will be able to perform 6 MWT within  normal limits for age 89 ft to 1905 ft Baseline: 351 ft Goal status: INITIAL  6.  Pt will improve endurance by walking at least 20 minutes 3 times a week Baseline: No current routine exercise. Does not walk greater than 5 min Goal status: INITIAL  PLAN:  PT FREQUENCY: 1-2x/week  PT DURATION: 8 weeks  PLANNED INTERVENTIONS: 97164- PT Re-evaluation, 97750- Physical Performance Testing, 97110-Therapeutic exercises, 97530- Therapeutic activity, [id number]- Neuromuscular re-education, 97535- Self Care, 11914- Manual therapy, [phone]- Gait training, [phone]- Aquatic Therapy, [phone]- Electrical stimulation (manual), Patient/Family education, Balance training, Stair training, Taping, Dry Needling, Joint mobilization, Spinal mobilization, Cryotherapy, and Moist heat.  PLAN FOR NEXT SESSION: issue HEP, TPDN as needed for thoracic pain   Sharlet Dawson, PT, A[facility] Certified Exercise Expert for the Aging Adult  01/11/24 3:56 PM Phone: [phone] Fax: [phone]

## 2024-01-11 ENCOUNTER — Ambulatory Visit: Admitting: Psychiatry

## 2024-01-11 ENCOUNTER — Ambulatory Visit: Admitting: Physical Therapy

## 2024-01-11 ENCOUNTER — Encounter: Payer: Self-pay | Admitting: Physical Therapy

## 2024-01-11 DIAGNOSIS — M545 Low back pain, unspecified: Secondary | ICD-10-CM

## 2024-01-11 DIAGNOSIS — M546 Pain in thoracic spine: Secondary | ICD-10-CM | POA: Diagnosis not present

## 2024-01-11 DIAGNOSIS — R293 Abnormal posture: Secondary | ICD-10-CM

## 2024-01-11 DIAGNOSIS — M6281 Muscle weakness (generalized): Secondary | ICD-10-CM

## 2024-01-11 DIAGNOSIS — F3132 Bipolar disorder, current episode depressed, moderate: Secondary | ICD-10-CM

## 2024-01-11 DIAGNOSIS — G8929 Other chronic pain: Secondary | ICD-10-CM

## 2024-01-11 NOTE — Therapy (Signed)
 OUTPATIENT PHYSICAL THERAPY THORACOLUMBAR TREATMENT   Patient Name: Megan Whitney MRN: [id number] DOB:[birth year], 68 y.o., female Today's Date: 01/16/2024  END OF SESSION:  PT End of Session - 01/16/24 1010     Visit Number 3    Number of Visits 16    Date for PT Re-Evaluation 02/20/24    Authorization Type [employer] P[employer] MCR    PT Start Time 1015    PT Stop Time 1100    PT Time Calculation (min) 45 min    Activity Tolerance Patient tolerated treatment well    Behavior During Therapy WFL for tasks assessed/performed                Past Medical History:  Diagnosis Date   ADD (attention deficit disorder)    Allergy    Seasonal   Anemia    History of GI blood loss   Anxiety    Arthritis    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    Esophageal stricture    Fracture of superior pubic ramus (HCC) 11/28/2018   GERD (gastroesophageal reflux disease)    Headache(784.0)    Hyperlipidemia    Interstitial cystitis    Joint pain    Lactose intolerance    Lung cancer (HCC) 2002   Neuromuscular disorder (HCC)    Obesity    Osteoarthritis    Osteoporosis    Palpitations    Sleep apnea    Doesn't use a CPAP   Suicidal ideation 01/20/2020   Swallowing difficulty    Tardive dyskinesia    Past Surgical History:  Procedure Laterality Date   BALLOON DILATION  05/16/2012   Procedure: BALLOON DILATION;  Surgeon: Claudette Cue, MD;  Location: [facility] ENDOSCOPY;  Service: Endoscopy;  Laterality: N/A;   BUNIONECTOMY  2011   COLONOSCOPY     ENTEROSCOPY  05/16/2012   Procedure: ENTEROSCOPY;  Surgeon: Claudette Cue, MD;  Location: [facility] ENDOSCOPY;  Service: Endoscopy;  Laterality: N/A;   JOINT REPLACEMENT     right shoulder durgery 25 yrs ago  1988   TOTAL HIP ARTHROPLASTY Bilateral 2006, 2008   bilateral   TUBAL  LIGATION  1990   WEDGE RESECTION  2002   lung cancer   Patient Active Problem List   Diagnosis Date Noted   Neuromuscular disorder (HCC)    Attention deficit disorder 09/27/2023   Prediabetes 09/27/2023   DDD (degenerative disc disease), lumbar 03/27/2023   Class 1 obesity due to excess calories without serious comorbidity in adult 11/29/2022   Chronic thoracic back pain 09/22/2022   Neuroleptic-induced tardive dyskinesia 07/20/2022   Intertrigo 05/19/2022   Encounter for general adult medical examination with abnormal findings 03/22/2022   Arthritis of knee 08/17/2021   Polyp of colon 02/04/2021   Seasonal allergies 10/07/2020   Osteopenia 10/07/2020   Gait disturbance 10/07/2020   Hyperlipidemia 06/11/2020   Sleep apnea 06/11/2020   Stricture and stenosis of esophagus 05/16/2012   Hiatal hernia 05/16/2012   Dysphagia 05/15/2012   Depression    Bipolar disorder (HCC)    GERD (gastroesophageal reflux disease) 09/14/2010   History of colonic polyps 09/14/2010    PCP: Meldon Sport, MD   REFERRING PROVIDER: Mort Ards, MD   REFERRING DIAG:   Scoliosis deformity of spine  Thoracic back pain  Thoracic spondylosis  Rationale for Evaluation and Treatment: Rehabilitation  THERAPY DIAG:  Chronic bilateral thoracic back pain  Abnormal posture  Muscle weakness (generalized)  Chronic low back pain without sciatica, unspecified back pain laterality  ONSET DATE: Winter 2024 pain  SUBJECTIVE:                                                                                                                                                                                           SUBJECTIVE STATEMENT: I am doing better and I am a 0/10  but yesterday at end of day it was an 8-9/10 and I used my heating pad.I walked about 25 minutes in my neighborhood. [place] live in a bedroom community and I live in a house.      EVAL- I have scoliosis and I need strengthening in my core.   I  thought I was only going to do aquatics. I presently do not have pain but I have had pain especially early last year.  I am worried about the sharp pains that I have in my back and when I vaccuum and mop. I can stand < 5 minutes especially later in the day when I am tired. No issues with sleeping. Anything later in the afternoon I have pain sitting/driving, standing longer than 5 minutes  I can walk for 10 minutes and then pain.Pain limits me from socializing and my husband currently has terminal cancer. I am shaky from medication, Pt does not report doing any consistent exercise.  Pt has been increasing pain since last year.    PERTINENT HISTORY:  Hx of bipolar disorder, anxiety,ADD, Osteopenia, OSA, pelvic fx 2020, tripping, GERD, tardive dyskinesia, Bil THR  PAIN:  Are you having pain? Yes: NPRS scale: at rest 0/10 at worst 9/10 and later in day Pain location: thoracic and low back and especially on Right Pain description: aching Aggravating factors: standing later in day. Household chores. Bending over to get laundry, mopping and vacuuming outside changing the bird feeder with 5lb -10 lb bag Relieving factors: heating pad, medication once in a while   PRECAUTIONS: None  RED FLAGS: None   WEIGHT BEARING RESTRICTIONS: No  FALLS:  Has patient fallen in last 6 months? No  LIVING ENVIRONMENT: Lives with: lives with their spouse Lives in: House/apartment Stairs: steps outside and a ramp Has following equipment at home: None  OCCUPATION: retired  PLOF: Independent  PATIENT GOALS: Pt would like to be able to be stronger  NEXT MD VISIT: TBD  OBJECTIVE:  Note: Objective measures were completed at Evaluation unless otherwise noted.  DIAGNOSTIC FINDINGS:  See  medical record Lumbar radiographs from 2024 shows mild curvature of the spine, mild multilevel degenerative changes and grade 1 anterolisthesis of L4 on L5  PATIENT SURVEYS:  Modified Oswestry 52   COGNITION: Overall  cognitive status: Within functional limits for tasks assessed     SENSATION: WFL has tardive dyskinesia  MUSCLE LENGTH: Hamstrings: Right bil tightness   POSTURE: rounded shoulders, decreased lumbar lordosis, increased thoracic kyphosis, weight shift left, and scoliotic curve C curve ot left Left convex side  PALPATION: TTP over thoracic right spine  LUMBAR ROM:   *did not test forward flexion due to osteopenia /self report of osteoporosis  AROM eval  Flexion  flexed at 20 degrees in stance  Extension  Only to neutral  Right lateral flexion 50%  Left lateral flexion 75% limited *  Right rotation   Left rotation   Key: WFL = within functional limits not formally assessed, * = concordant pain, s = stiffness/stretching sensation, NT = not tested)     LOWER EXTREMITY ROM:     Active  Right eval Left eval  Hip flexion 4 4  Hip extension 4- 4-  Hip abduction 4- 3+  Hip adduction    Hip internal rotation    Hip external rotation    Knee flexion 4 5  Knee extension 5 5  Ankle dorsiflexion 4 4  Ankle plantarflexion    Ankle inversion    Ankle eversion    Key: WFL = within functional limits not formally assessed, * = concordant pain, s = stiffness/stretching sensation, NT = not tested)     LOWER EXTREMITY MMT:    MMT Right eval Left eval  Hip flexion 5 4  Hip extension 4- 4-  Hip abduction 4- 4-  Hip adduction    Hip internal rotation    Hip external rotation    Knee flexion 4 5  Knee extension 5 5  Ankle dorsiflexion 4 4  Ankle plantarflexion    Ankle inversion    Ankle eversion    (Blank rows = not tested, score listed is out of 5 possible points.  N = WNL, D = diminished, C = clear for gross weakness with myotome testing, * = concordant pain with testing)   LUMBAR SPECIAL TESTS:  Straight leg raise test: Negative and Slump test: Negative  FUNCTIONAL TESTS:  5 times sit to stand: 19.47 2 minute walk test: 351 ft   Single leg stance  LT 2 sec  RT 0  sec   01-04-24  1154 ft (1500-1905 ft) 01-16-24  5 X STS 11.67 sec GAIT: Distance walked: 351 ft Assistive device utilized: None Level of assistance: Complete Independence Comments: Pt leans to left while walking,  [facility] Adult PT Treatment:                                                DATE: 01-16-24 Standing exercises and deadlift safety Therapeutic Exercise: Chin tuck exercise 10 x 10 sec 90 90 abdominal setting.  Physioball bridge Hip hinge with dowel Therapeutic Activity: 5 x STS  11.97 sec STS with !5 # KB  3 x 10 Proper lifting procedure for weight Deadlift backwards on elevated 8 inch box x 6  Deadlift forward on elevated  8 in box x 6  Self Care: Education on posture and body mechanics  Education on using body mechanics  around household chores with handouts [facility] Adult PT Treatment:                                                DATE: 01-11-24 Therapeutic Exercise: Supine pelvic tilt 20 x with VC and TC for correct execution STKC on Right and Left 5 x for 10 sec each Bridge with ball 2 x 10 LTR Star pattern Green T band 3 x 10 with chin tuck Chin tuck x 10 for 10 sec each Child's pose stretch forward , and to sides added to HEP  Therapeutic Activity: STS with 5 lb DB 2  x 10 Reverse lunge x 5 each side  Self Care: DOMs education and progressive strengthening and need for good nutrition and hydration  [facility] Adult PT Treatment:                                                DATE: 01-04-24 Therapeutic Exercise: Supine pelvic tilt 20 x with VC and TC for correct execution STKC on Right and Left 5 x for 10 sec each LTR Bridge with ball 2 x 10 Star pattern Red T band 3 x 10 with chin tuck Chin tuck x 10 for 10 sec each   Therapeutic Activity: STS with 5 lb DB 3 x 10  1154 ft (1500-1905 ft)  Self Care: Walking program education   TREATMENT DATE: eval and issue HEP  Aquatic exercise therapy information                                                                                                                                  PATIENT EDUCATION:  Education details: POC Explanation of findings HEP issue Aquatic therapy information Person educated: Patient Education method: Explanation, Demonstration, Tactile cues, Verbal cues, and Handouts Education comprehension: verbalized understanding, returned demonstration, verbal cues required, tactile cues required, and needs further education  HOME EXERCISE PROGRAM: Access Code: [email or web address] URL: [email or web address] Date: 12/26/2023 Prepared by: Sharlet Dawson  Exercises - Supine Pelvic Tilt  - 1 x daily - 7 x weekly - 3 sets - 10 reps - Supine Single Knee to Chest Stretch  - 2 x daily - 7 x weekly - 1 sets - 5 reps - 10 hold - Supine Lower Trunk Rotation  - 1 x daily - 7 x weekly - 3 sets - 10 reps - Sit to stand with sink support Movement snack  - 1 x daily - 7 x weekly - 3 sets - 10 reps Added 01-04-24  Exercises - Supine Cervical Retraction with Towel  - 1 x daily - 7 x weekly - 1 sets - 10 reps - 10 sec  hold - Standing Shoulder Diagonal Horizontal Abduction 60/120 Degrees with Resistance  - 1 x daily - 7 x weekly - 3 sets - 10 reps - Standing Shoulder Horizontal Abduction with Resistance  - 1 x daily - 7 x weekly - 3 sets - 10 reps Added5-8-25 - childs pose stretch forward and to side  - 1 x daily - 7 x weekly - 1 sets - 6 reps - 15-20 sec hold Access Code: [email or web address] URL: [email or web address] Date: 01/16/2024 Prepared by: Sharlet Dawson  Exercises - Supine Pelvic Tilt  - 1 x daily - 7 x weekly - 3 sets - 10 reps - Supine Single Knee to Chest Stretch  - 2 x daily - 7 x weekly - 1 sets - 5 reps - 10 hold - Supine Lower Trunk Rotation  - 1 x daily - 7 x weekly - 3 sets - 10 reps - Supine Bridge with Mini Swiss Ball Between Knees  - 1 x daily - 7 x weekly - 3 sets - 10 reps - Sit to stand with sink support Movement snack  - 1 x daily - 7 x weekly - 3 sets - 10  reps - Supine Cervical Retraction with Towel  - 1 x daily - 7 x weekly - 1 sets - 10 reps - 10 sec hold - Standing Shoulder Diagonal Horizontal Abduction 60/120 Degrees with Resistance  - 1 x daily - 7 x weekly - 3 sets - 10 reps - Standing Shoulder Horizontal Abduction with Resistance  - 1 x daily - 7 x weekly - 3 sets - 10 reps - childs pose stretch forward and to side  - 1 x daily - 7 x weekly - 1 sets - 6 reps - 15-20 sec hold - Supine90/90 abdominal Bracing resting on couch  - 1 x daily - 7 x weekly - 1 sets - 5 reps - 10-15 hold - Standing Hip Hinge with Dowel  - 1 x daily - 7 x weekly - 3 sets - 10 reps - Deadlift on elevated 8 inch box with 10 lb  - 1 x daily - 3 x weekly - 1 sets - 6 reps ASSESSMENT:  CLINICAL IMPRESSION: Pt returns to clinic with 0/10 pain but did have 8/10 pain yesterday at end of day.  Exercise are improving pain level but still learning routine and incorporating more strengthening into program  Pt educated about posture and body mechanics with deadlift and demo  with progressive strengthening.HEP updated.  Check goals next visit.     EVAL-Patient is a 68 y.o. female who was seen today for physical therapy evaluation and treatment for thoracic and lumbar pain due to scoliosis and sedentary lifestyle. Pt does not report any routine exercise and does not walk more than 5 minutes. Pt is caring for terminally ill husband and needs to improve strength, and pain control in order to perform household duties and caring for husband. Pt will also benefit from aquatic therapy and skilled PT to improve impairments   OBJECTIVE IMPAIRMENTS: decreased activity tolerance, decreased mobility, difficulty walking, decreased ROM, decreased strength, impaired flexibility, improper body mechanics, postural dysfunction, obesity, pain, and scoliosis C curve .   ACTIVITY LIMITATIONS: carrying, lifting, bending, sitting, standing, squatting, stairs, locomotion level, and caring for  others  PARTICIPATION LIMITATIONS: meal prep, cleaning, laundry, driving, and community activity  PERSONAL FACTORS: Hx of bipolar disorder, anxiety,ADD, Osteopenia, OSA, pelvic fx 2020, tripping, GERD, tardive dyskinesia, Bil THR are also affecting patient's functional outcome.  REHAB POTENTIAL: Good  CLINICAL DECISION MAKING: Evolving/moderate complexity  EVALUATION COMPLEXITY: Moderate   GOALS: Goals reviewed with patient? Yes  SHORT TERM GOALS: Target date: 01-23-24  Pt will demonstrate appropriate understanding and performance of initially prescribed HEP in order to facilitate improved independence with management of symptoms Baseline:no knowledge 01-16-24  Education on posture and body mechanics Goal status ONGOING  2.  Pt will perform 5xSTS in <13.0 sec in order to demonstrate reduced fall risk and improved functional independence. (MCID of 2.3sec) Baseline: 19.47 sec 01-16-24 11:97 sec Goal status: MET  3.  Demonstrate understanding of elongation of spine in sitting posture and be more conscious of position and posture throughout the day.  Baseline: Left C Curve scoliorsis Goal status: INITIAL  4.  Begin Aquatic therapy for management of movement and pain control Baseline: No knowledge Goal status: INITIAL   LONG TERM GOALS: Target date: 02-20-24  Pt will be I with advanced HEP Baseline: no knowledge Goal status: INITIAL  2.  Demonstrate and verbalize techniques to reduce the risk of re-injury including: lifting, posture, body mechanics mindful of scoliosis/  Baseline:  Goal status: INITIAL  3.  [place] will show a >/= 12 pt improvement in their ODI score (MCID is 12 pts) as a proxy for functional improvement Baseline: 52 % Goal status: INITIAL  4.  Pt will demonstrate increase lumbar and thoracic strength by being able to carry 5- 10 lb bird seed bag without exacerbating thoracic /lumbar pain greater than 3/10 Baseline: at worst 9/10 Goal status: INITIAL  5.   Pt will be able to perform 6 MWT within normal limits for age 53 ft to 1905 ft Baseline: 351 ft Goal status: INITIAL  6.  Pt will improve endurance by walking at least 20 minutes 3 times a week Baseline: No current routine exercise. Does not walk greater than 5 min Goal status: INITIAL  PLAN:  PT FREQUENCY: 1-2x/week  PT DURATION: 8 weeks  PLANNED INTERVENTIONS: [id number]- PT Re-evaluation, 97750- Physical Performance Testing, 97110-Therapeutic exercises, 97530- Therapeutic activity, [id number]- Neuromuscular re-education, 97535- Self Care, 96045- Manual therapy, [phone]- Gait training, [phone]- Aquatic Therapy, [phone]- Electrical stimulation (manual), Patient/Family education, Balance training, Stair training, Taping, Dry Needling, Joint mobilization, Spinal mobilization, Cryotherapy, and Moist heat.  PLAN FOR NEXT SESSION: issue HEP, TPDN as needed for thoracic pain  Sharlet Dawson, PT, ATRIC Certified Exercise Expert for the Aging Adult  01/16/24 12:57 PM Phone: [phone] Fax: [phone]
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
## 2024-01-12 ENCOUNTER — Ambulatory Visit: Admitting: Physical Therapy

## 2024-01-16 ENCOUNTER — Ambulatory Visit: Admitting: Physical Therapy

## 2024-01-16 ENCOUNTER — Encounter: Payer: Self-pay | Admitting: Physical Therapy

## 2024-01-16 DIAGNOSIS — R293 Abnormal posture: Secondary | ICD-10-CM

## 2024-01-16 DIAGNOSIS — G8929 Other chronic pain: Secondary | ICD-10-CM

## 2024-01-16 DIAGNOSIS — M6281 Muscle weakness (generalized): Secondary | ICD-10-CM

## 2024-01-16 DIAGNOSIS — M546 Pain in thoracic spine: Secondary | ICD-10-CM | POA: Diagnosis not present

## 2024-01-16 NOTE — Patient Instructions (Signed)
 Posture Tips DO: - stand tall and erect - keep chin tucked in - keep head and shoulders in alignment - check posture regularly in mirror or large window - pull head back against headrest in car seat;  Change your position often.  Sit with lumbar support. DON'T: - slouch or slump while watching TV or reading - sit, stand or lie in one position  for too long;  Sitting is especially hard on the spine so if you sit at a desk/use the computer, then stand up often!   Copyright  VHI. All rights reserved.  Posture - Standing   Good posture is important. Avoid slouching and forward head thrust. Maintain curve in low back and align ears over shoul- ders, hips over ankles.  Pull your belly button in toward your back bone.   Copyright  VHI. All rights reserved.  Posture - Sitting   Sit upright, head facing forward. Try using a roll to support lower back. Keep shoulders relaxed, and avoid rounded back. Keep hips level with knees. Avoid crossing legs for long periods.   Copyright  VHI. All rights reserved.    Sleeping on Back  Place pillow under knees. A pillow with cervical support and a roll around waist are also helpful. Copyright  VHI. All rights reserved.  Sleeping on Side Place pillow between knees. Use cervical support under neck and a roll around waist as needed. Copyright  VHI. All rights reserved.   Sleeping on Stomach   If this is the only desirable sleeping position, place pillow under lower legs, and under stomach or chest as needed.  Posture - Sitting   Sit upright, head facing forward. Try using a roll to support lower back. Keep shoulders relaxed, and avoid rounded back. Keep hips level with knees. Avoid crossing legs for long periods. Stand to Sit / Sit to Stand   To sit: Bend knees to lower self onto front edge of chair, then scoot back on seat. To stand: Reverse sequence by placing one foot forward, and scoot to front of seat. Use rocking motion to stand up.   Work  Height and Reach  Ideal work height is no more than 2 to 4 inches below elbow level when standing, and at elbow level when sitting. Reaching should be limited to arm's length, with elbows slightly bent.  Bending  Bend at hips and knees, not back. Keep feet shoulder-width apart.    Posture - Standing   Good posture is important. Avoid slouching and forward head thrust. Maintain curve in low back and align ears over shoul- ders, hips over ankles.  Alternating Positions   Alternate tasks and change positions frequently to reduce fatigue and muscle tension. Take rest breaks. Computer Work   Position work to Art gallery manager. Use proper work and seat height. Keep shoulders back and down, wrists straight, and elbows at right angles. Use chair that provides full back support. Add footrest and lumbar roll as needed.  Getting Into / Out of Car  Lower self onto seat, scoot back, then bring in one leg at a time. Reverse sequence to get out.  Dressing  Lie on back to pull socks or slacks over feet, or sit and bend leg while keeping back straight.    Housework - Sink  Place one foot on ledge of cabinet under sink when standing at sink for prolonged periods.   Pushing / Pulling  Pushing is preferable to pulling. Keep back in proper alignment, and use leg muscles to  do the work.  Deep Squat   Squat and lift with both arms held against upper trunk. Tighten stomach muscles without holding breath. Use smooth movements to avoid jerking.  Avoid Twisting   Avoid twisting or bending back. Pivot around using foot movements, and bend at knees if needed when reaching for articles.  Carrying Luggage   Distribute weight evenly on both sides. Use a cart whenever possible. Do not twist trunk. Move body as a unit.   Lifting Principles Maintain proper posture and head alignment. Slide object as close as possible before lifting. Move obstacles out of the way. Test before lifting; ask for  help if too heavy. Tighten stomach muscles without holding breath. Use smooth movements; do not jerk. Use legs to do the work, and pivot with feet. Distribute the work load symmetrically and close to the center of trunk. Push instead of pull whenever possible.   Ask For Help   Ask for help and delegate to others when possible. Coordinate your movements when lifting together, and maintain the low back curve.  Log Roll   Lying on back, bend left knee and place left arm across chest. Roll all in one movement to the right. Reverse to roll to the left. Always move as one unit. Housework - Sweeping  Use long-handled equipment to avoid stooping.   Housework - Wiping  Position yourself as close as possible to reach work surface. Avoid straining your back.  Laundry - Unloading Wash   To unload small items at bottom of washer, lift leg opposite to arm being used to reach.  Gardening - Raking  Move close to area to be raked. Use arm movements to do the work. Keep back straight and avoid twisting.     Cart  When reaching into cart with one arm, lift opposite leg to keep back straight.   Getting Into / Out of Bed  Lower self to lie down on one side by raising legs and lowering head at the same time. Use arms to assist moving without twisting. Bend both knees to roll onto back if desired. To sit up, start from lying on side, and use same move-ments in reverse. Housework - Vacuuming  Hold the vacuum with arm held at side. Step back and forth to move it, keeping head up. Avoid twisting.   Laundry - Armed forces training and education officer so that bending and twisting can be avoided.   Laundry - Unloading Dryer  Squat down to reach into clothes dryer or use a reacher.  Gardening - Weeding / Psychiatric nurse or Kneel. Knee pads may be helpful.                    Megan Whitney, PT, [facility] Certified Exercise Expert for the Aging Adult  01/16/24 10:24 AM Phone:  [phone] Fax: [phone]

## 2024-01-18 ENCOUNTER — Encounter: Admitting: Physical Therapy

## 2024-01-18 ENCOUNTER — Telehealth: Payer: Self-pay | Admitting: Psychiatry

## 2024-01-18 NOTE — Telephone Encounter (Addendum)
 Pt reporting she is taking Austedo XR 30, has 2 days left. She reports the lip pursing has not let up. She said she does get a short break in the evening, but otherwise is all day.   Patient called back to report teeth clinching and tongue biting have returned as well.

## 2024-01-18 NOTE — Telephone Encounter (Signed)
 Noted will make sure Dr. Toi Foster is aware

## 2024-01-18 NOTE — Telephone Encounter (Signed)
 Pt called 9:45 am reporting new med she has been taking for 7 days doesn't seem to be helping pursing her lips. RTC [phone] Apt 6/3

## 2024-01-18 NOTE — Telephone Encounter (Signed)
 Stop ingrezza  DT insurance won't cover BID anymore. Switch to Austedo XR 30 mg daily in hopes also better response.  Call by 10 days to verify the dose or whether increase is needed.

## 2024-01-19 ENCOUNTER — Telehealth: Payer: Self-pay

## 2024-01-19 ENCOUNTER — Ambulatory Visit: Admitting: Physical Therapy

## 2024-01-19 ENCOUNTER — Other Ambulatory Visit: Payer: Self-pay

## 2024-01-19 DIAGNOSIS — G2401 Drug induced subacute dyskinesia: Secondary | ICD-10-CM

## 2024-01-19 MED ORDER — AUSTEDO XR 36 MG PO TB24: 36.0000 mg | ORAL_TABLET | Freq: Every day | ORAL | 0 refills | Status: DC

## 2024-01-19 NOTE — Telephone Encounter (Signed)
 Prior Authorization submitted for Austedo XR 36 mg with [employer]

## 2024-01-19 NOTE — Telephone Encounter (Signed)
 Discussed pt's symptoms with Dr. Toi Foster and he feels her dose needs to be increased to a higher dose. The next dose is 36 mg Austedo XR. I will check if pt can come by to get samples or send in new Rx.

## 2024-01-22 ENCOUNTER — Telehealth: Payer: Self-pay | Admitting: Psychiatry

## 2024-01-22 ENCOUNTER — Ambulatory Visit

## 2024-01-22 NOTE — Telephone Encounter (Signed)
 Next appt is 01/23/24 at 0900, Megan Whitney called stating that her husband is in the hospital and not doing good. She won't be at her visit tomorrow at 09:00 cause she needs to stay with him.

## 2024-01-22 NOTE — Telephone Encounter (Signed)
 Mistakenly routed to clinical. Spoke to Rice Chamorro and she canceled appt and notified Debbie.

## 2024-01-23 ENCOUNTER — Ambulatory Visit: Admitting: Psychiatry

## 2024-01-23 ENCOUNTER — Ambulatory Visit: Admitting: Physical Therapy

## 2024-01-23 NOTE — Telephone Encounter (Signed)
 Approval received effective 01/19/24-04/20/24 with [employer] for Austedo XR 36 mg. [facility] mailed Rx to pt on Monday.

## 2024-01-25 ENCOUNTER — Other Ambulatory Visit: Payer: Self-pay | Admitting: Psychiatry

## 2024-01-30 ENCOUNTER — Ambulatory Visit: Admitting: Psychiatry

## 2024-01-30 ENCOUNTER — Other Ambulatory Visit: Payer: Self-pay

## 2024-01-30 ENCOUNTER — Encounter: Payer: Self-pay | Admitting: Physical Therapy

## 2024-01-30 ENCOUNTER — Ambulatory Visit: Admitting: Physical Therapy

## 2024-01-30 DIAGNOSIS — R293 Abnormal posture: Secondary | ICD-10-CM

## 2024-01-30 DIAGNOSIS — M6281 Muscle weakness (generalized): Secondary | ICD-10-CM

## 2024-01-30 DIAGNOSIS — F411 Generalized anxiety disorder: Secondary | ICD-10-CM

## 2024-01-30 DIAGNOSIS — Z79899 Other long term (current) drug therapy: Secondary | ICD-10-CM | POA: Diagnosis not present

## 2024-01-30 DIAGNOSIS — G8929 Other chronic pain: Secondary | ICD-10-CM

## 2024-01-30 DIAGNOSIS — M546 Pain in thoracic spine: Secondary | ICD-10-CM | POA: Diagnosis not present

## 2024-01-30 DIAGNOSIS — F319 Bipolar disorder, unspecified: Secondary | ICD-10-CM | POA: Diagnosis not present

## 2024-01-30 DIAGNOSIS — F3132 Bipolar disorder, current episode depressed, moderate: Secondary | ICD-10-CM

## 2024-01-30 MED ORDER — LAMOTRIGINE 100 MG PO TABS: 100.0000 mg | ORAL_TABLET | Freq: Two times a day (BID) | ORAL | 0 refills | Status: DC

## 2024-01-30 NOTE — Progress Notes (Signed)
 [facility] Counselor/Therapist Progress Note  Patient ID: Megan Whitney, MRN: [id number],    Date: 01/30/2024  Time Spent: 53 minutes   Treatment Type: Individual Therapy  Reported Symptoms: "higher anxiety", depression, "bipolar", denies any thoughts to harm self or others   Mental Status Exam:  Appearance:   Casual     Behavior:  Appropriate, Sharing, and Motivated  Motor:  Normal  Speech/Language:   Clear and Coherent  Affect:  Depressed and anxious  Mood:  anxious and depressed  Thought process:  goal directed  Thought content:    Rumination  Sensory/Perceptual disturbances:    WNL  Orientation:  oriented to person, place, time/date, situation, day of week, month of year, year, and stated date of Jan 30, 2024  Attention:  Fair  Concentration:  Fair  Memory:  WNL  Fund of knowledge:   Good  Insight:    Good and Fair  Judgment:   Good and Fair  Impulse Control:  Good and Fair   Risk Assessment: Danger to Self:  No Self-injurious Behavior: No Danger to Others: No Duty to Warn:no Physical Aggression / Violence:No  Access to Firearms a concern: No  Gang Involvement:No   Subjective:    Patient alert and focused in session today reporting symptoms of "high anxiety", bipolar, depression. Husband was taken on emergency this past Saturday nights and stayed overnight at hospital after mixing alcohol with his medication. Discharged next day and has been ok since that time. Frustrated as husband continues to abuse alcohol heavily. Husband gets angry with patient and she is very focused on safety. Husband continues to blow money on alcohol, which he abuses every day. Realizing she can't make him stop drinking and tries to get him to quit. Focused more today on her own self-care including eating healthy, doing physical therapy, walking some, get more consistent rest, good contact with supportive friends, follow up on some exercises she can do, and spend more time with friends  as she is able. Continues to enjoy her 2 dogs.Depression some better at times but not much better;" anxiety makes me depressed." Trying not to be so negatively impacted by husband's poor choices. Still hard earlier in the a.m. Negative self talk still happening but it is better. Appreciates supportive friends. Husband continues to be emotionally volatile at times. Encouraged to remain in contact with good/supportive friends. Encouraged patient's work on her tx goals, take more time for herself, use healthy boundaries with others as needed, and continue her meds as prescribed.More interactive with others and that is being encouraged each time in her sessions here.   Interventions: Cognitive Behavioral Therapy and Ego-Supportive Long Term Goal: Reduce overall level, frequency, and intensity of the anxiety so that daily functioning is not impaired. Short Term Goal: 1.Increase understanding of the beliefs and messages that produce the worry and anxiety. Strategies: 1.Help client develop reality-based positive cognitive messages/self-talk. 2. Develop a "coping card" or other reminder which coping strategies are recorded for patient's later use.  Diagnosis:   ICD-10-CM   1. Generalized anxiety disorder  F41.1      Plan:   Patient today in session and showing good strength and working on her grief over how things are "not any better" with her husband and he continues to abuse alcohol heavily with his cancer.  Patient encouraged to keep focusing on her own self-care in addition to supporting husband as she is able and as he allows. Affects my functioning but am doing some  better in some ways, showing some good strength and also is encouraged to focus more on her own self and her needs in the midst of (as she states, Chaos). Good work today in session and continues to work diligently on her therapy issues.  Encouraged patient to be continuing her work with treatment strategies and goals including letting  others be of support to her, refrain from self sabotage, use of good judgment and making thought-through decisions rather than impulsive decisions, refrain from assuming worst-case scenarios, seeing her strengths and the positives within herself, engage frequently with her 2 dogs who are very therapeutic for her, and recognize the strengths she shows when working with goal-directed behaviors to move in a direction that supports and improves her overall physical and emotional health in addition to her outlook and trying to maintain some hopefulness which she has been reporting in sessions.  Ajai Terhaar continues to make progress and needs to continue her work with goal-directed behaviors in order to build on the progress she has made and keep moving forward in a healthier and more hopeful direction.  Goal review and progress/challenges noted with patient.  Next appointment within 2-3 weeks.   [name], LCSW

## 2024-01-30 NOTE — Therapy (Signed)
 OUTPATIENT PHYSICAL THERAPY THORACOLUMBAR TREATMENT   Patient Name: Megan Whitney MRN: [id number] DOB:Apr 09, 1956, 68 y.o., female Today's Date: 01/30/2024  END OF SESSION:  PT End of Session - 01/30/24 1455     Visit Number 4    Number of Visits 16    Date for PT Re-Evaluation 02/20/24    Authorization Type [employer] P[employer] MCR    PT Start Time 1500    PT Stop Time 1545    PT Time Calculation (min) 45 min    Activity Tolerance Patient tolerated treatment well    Behavior During Therapy WFL for tasks assessed/performed                 Past Medical History:  Diagnosis Date   ADD (attention deficit disorder)    Allergy    Seasonal   Anemia    History of GI blood loss   Anxiety    Arthritis    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    Esophageal stricture    Fracture of superior pubic ramus (HCC) 11/28/2018   GERD (gastroesophageal reflux disease)    Headache(784.0)    Hyperlipidemia    Interstitial cystitis    Joint pain    Lactose intolerance    Lung cancer (HCC) 2002   Neuromuscular disorder (HCC)    Obesity    Osteoarthritis    Osteoporosis    Palpitations    Sleep apnea    Doesn't use a CPAP   Suicidal ideation 01/20/2020   Swallowing difficulty    Tardive dyskinesia    Past Surgical History:  Procedure Laterality Date   BALLOON DILATION  05/16/2012   Procedure: BALLOON DILATION;  Surgeon: Claudette Cue, MD;  Location: [facility] ENDOSCOPY;  Service: Endoscopy;  Laterality: N/A;   BUNIONECTOMY  2011   COLONOSCOPY     ENTEROSCOPY  05/16/2012   Procedure: ENTEROSCOPY;  Surgeon: Claudette Cue, MD;  Location: [facility] ENDOSCOPY;  Service: Endoscopy;  Laterality: N/A;   JOINT REPLACEMENT     right shoulder durgery 25 yrs ago  1988   TOTAL HIP ARTHROPLASTY Bilateral 2006, 2008   bilateral    TUBAL LIGATION  1990   WEDGE RESECTION  2002   lung cancer   Patient Active Problem List   Diagnosis Date Noted   Neuromuscular disorder (HCC)    Attention deficit disorder 09/27/2023   Prediabetes 09/27/2023   DDD (degenerative disc disease), lumbar 03/27/2023   Class 1 obesity due to excess calories without serious comorbidity in adult 11/29/2022   Chronic thoracic back pain 09/22/2022   Neuroleptic-induced tardive dyskinesia 07/20/2022   Intertrigo 05/19/2022   Encounter for general adult medical examination with abnormal findings 03/22/2022   Arthritis of knee 08/17/2021   Polyp of colon 02/04/2021   Seasonal allergies 10/07/2020   Osteopenia 10/07/2020   Gait disturbance 10/07/2020   Hyperlipidemia 06/11/2020   Sleep apnea 06/11/2020   Stricture and stenosis of esophagus 05/16/2012   Hiatal hernia 05/16/2012   Dysphagia 05/15/2012   Depression    Bipolar disorder (HCC)    GERD (gastroesophageal reflux disease) 09/14/2010   History of colonic polyps 09/14/2010    PCP: Meldon Sport, MD   REFERRING PROVIDER: Mort Ards, MD   REFERRING DIAG:   Scoliosis deformity of spine  Thoracic back pain  Thoracic spondylosis  Rationale for Evaluation and Treatment: Rehabilitation  THERAPY DIAG:  Chronic bilateral thoracic back pain  Abnormal posture  Muscle weakness (generalized)  Chronic low back pain without sciatica, unspecified back pain laterality  ONSET DATE: Winter 2024 pain  SUBJECTIVE:                                                                                                                                                                                           SUBJECTIVE STATEMENT: I am doing better and I am a 0/10  but yesterday at end of day it was an 8-9/10 and I used my heating pad.I walked about 25 minutes in my neighborhood. [place] live in a bedroom community and I live in a house.      EVAL- I have scoliosis and I need strengthening in my  core.  I  thought I was only going to do aquatics. I presently do not have pain but I have had pain especially early last year.  I am worried about the sharp pains that I have in my back and when I vaccuum and mop. I can stand < 5 minutes especially later in the day when I am tired. No issues with sleeping. Anything later in the afternoon I have pain sitting/driving, standing longer than 5 minutes  I can walk for 10 minutes and then pain.Pain limits me from socializing and my husband currently has terminal cancer. I am shaky from medication, Pt does not report doing any consistent exercise.  Pt has been increasing pain since last year.    PERTINENT HISTORY:  Hx of bipolar disorder, anxiety,ADD, Osteopenia, OSA, pelvic fx 2020, tripping, GERD, tardive dyskinesia, Bil THR  PAIN:  Are you having pain? Yes: NPRS scale: at rest 0/10 at worst 9/10 and later in day Pain location: thoracic and low back and especially on Right Pain description: aching Aggravating factors: standing later in day. Household chores. Bending over to get laundry, mopping and vacuuming outside changing the bird feeder with 5lb -10 lb bag Relieving factors: heating pad, medication once in a while   PRECAUTIONS: None  RED FLAGS: None   WEIGHT BEARING RESTRICTIONS: No  FALLS:  Has patient fallen in last 6 months? No  LIVING ENVIRONMENT: Lives with: lives with their spouse Lives in: House/apartment Stairs: steps outside and a ramp Has following equipment at home: None  OCCUPATION: retired  PLOF: Independent  PATIENT GOALS: Pt would like to be able to be stronger  NEXT MD VISIT: TBD  OBJECTIVE:  Note: Objective measures were completed at Evaluation unless otherwise noted.  DIAGNOSTIC FINDINGS:  See  medical record Lumbar radiographs from 2024 shows mild curvature of the spine, mild multilevel degenerative changes and grade 1 anterolisthesis of L4 on L5  PATIENT SURVEYS:  Modified Oswestry 52    COGNITION: Overall cognitive status: Within functional limits for tasks assessed     SENSATION: WFL has tardive dyskinesia  MUSCLE LENGTH: Hamstrings: Right bil tightness   POSTURE: rounded shoulders, decreased lumbar lordosis, increased thoracic kyphosis, weight shift left, and scoliotic curve C curve ot left Left convex side  PALPATION: TTP over thoracic right spine  LUMBAR ROM:   *did not test forward flexion due to osteopenia /self report of osteoporosis  AROM eval  Flexion  flexed at 20 degrees in stance  Extension  Only to neutral  Right lateral flexion 50%  Left lateral flexion 75% limited *  Right rotation   Left rotation   Key: WFL = within functional limits not formally assessed, * = concordant pain, s = stiffness/stretching sensation, NT = not tested)     LOWER EXTREMITY ROM:     Active  Right eval Left eval  Hip flexion 4 4  Hip extension 4- 4-  Hip abduction 4- 3+  Hip adduction    Hip internal rotation    Hip external rotation    Knee flexion 4 5  Knee extension 5 5  Ankle dorsiflexion 4 4  Ankle plantarflexion    Ankle inversion    Ankle eversion    Key: WFL = within functional limits not formally assessed, * = concordant pain, s = stiffness/stretching sensation, NT = not tested)     LOWER EXTREMITY MMT:    MMT Right eval Left eval  Hip flexion 5 4  Hip extension 4- 4-  Hip abduction 4- 4-  Hip adduction    Hip internal rotation    Hip external rotation    Knee flexion 4 5  Knee extension 5 5  Ankle dorsiflexion 4 4  Ankle plantarflexion    Ankle inversion    Ankle eversion    (Blank rows = not tested, score listed is out of 5 possible points.  N = WNL, D = diminished, C = clear for gross weakness with myotome testing, * = concordant pain with testing)   LUMBAR SPECIAL TESTS:  Straight leg raise test: Negative and Slump test: Negative  FUNCTIONAL TESTS:  5 times sit to stand: 19.47 2 minute walk test: 351 ft   Single leg  stance  LT 2 sec  RT 0 sec   01-04-24  1154 ft (1500-1905 ft) 01-16-24  5 X STS 11.67 sec GAIT: Distance walked: 351 ft Assistive device utilized: None Level of assistance: Complete Independence Comments: Pt leans to left while walking,    [facility] Adult PT Treatment:                                                DATE: 01-30-24 Therapeutic Exercise: Osteoporosis  postural  correction with 1) lying in decompression Exercise for 5 minutes with deep breathing 2) shoulder press hold 2-3 sec and repeat 5 x 3) Head press hold 2-3 seconds repeat 5 x 4) leg press 2-3 seconds 6 x each leg 5) leg lengthening 2-3 seconds 6 x each leg  Therapeutic Activity: Hip hinge with dowel rod on 2 points due to scoliosis mid back and sacral area Sit to stand x 10  Self Care: Osteoporosis education given 2/3 of handout for education about avoiding sitting and twisting and standing and extension and twisting Simulating gardening scenarios and problem solving with osteoporosis precautions Posture and body mechanic education   [facility] Adult PT Treatment:                                                DATE: 01-16-24 Standing exercises and deadlift safety Therapeutic Exercise: Chin tuck exercise 10 x 10 sec 90 90 abdominal setting.  Physioball bridge Hip hinge with dowel Therapeutic Activity: 5 x STS  11.97 sec STS with !5 # KB  3 x 10 Proper lifting procedure for weight Deadlift backwards on elevated 8 inch box x 6  Deadlift forward on elevated  8 in box x 6  Self Care: Education on posture and body mechanics  Education on using body mechanics around household chores with handouts [facility] Adult PT Treatment:                                                DATE: 01-11-24 Therapeutic Exercise: Supine pelvic tilt 20 x with VC and TC for correct execution STKC on Right and Left 5 x for 10 sec each Bridge with ball 2 x 10 LTR Star pattern Green T band 3 x 10 with chin tuck Chin tuck x 10 for 10 sec  each Child's pose stretch forward , and to sides added to HEP  Therapeutic Activity: STS with 5 lb DB 2  x 10 Reverse lunge x 5 each side  Self Care: DOMs education and progressive strengthening and need for good nutrition and hydration  [facility] Adult PT Treatment:                                                DATE: 01-04-24 Therapeutic Exercise: Supine pelvic tilt 20 x with VC and TC for correct execution STKC on Right and Left 5 x for 10 sec each LTR Bridge with ball 2 x 10 Star pattern Red T band 3 x 10 with chin tuck Chin tuck x 10 for 10 sec each   Therapeutic Activity: STS with 5 lb DB 3 x 10  1154 ft (1500-1905 ft)  Self Care: Walking program education   TREATMENT DATE: eval and issue HEP  Aquatic exercise therapy information                                                                                                                                 PATIENT EDUCATION:  Education  details: POC Explanation of findings HEP issue Aquatic therapy information Person educated: Patient Education method: Explanation, Demonstration, Tactile cues, Verbal cues, and Handouts Education comprehension: verbalized understanding, returned demonstration, verbal cues required, tactile cues required, and needs further education  HOME EXERCISE PROGRAM: Access Code: [email or web address] URL: [email or web address] Date: 12/26/2023 Prepared by: Sharlet Dawson  Exercises - Supine Pelvic Tilt  - 1 x daily - 7 x weekly - 3 sets - 10 reps - Supine Single Knee to Chest Stretch  - 2 x daily - 7 x weekly - 1 sets - 5 reps - 10 hold - Supine Lower Trunk Rotation  - 1 x daily - 7 x weekly - 3 sets - 10 reps - Sit to stand with sink support Movement snack  - 1 x daily - 7 x weekly - 3 sets - 10 reps Added 01-04-24  Exercises - Supine Cervical Retraction with Towel  - 1 x daily - 7 x weekly - 1 sets - 10 reps - 10 sec hold - Standing Shoulder Diagonal Horizontal Abduction 60/120 Degrees  with Resistance  - 1 x daily - 7 x weekly - 3 sets - 10 reps - Standing Shoulder Horizontal Abduction with Resistance  - 1 x daily - 7 x weekly - 3 sets - 10 reps Added5-8-25 - childs pose stretch forward and to side  - 1 x daily - 7 x weekly - 1 sets - 6 reps - 15-20 sec hold Access Code: [email or web address] URL: [email or web address] Date: 01/16/2024 Prepared by: Sharlet Dawson  Exercises - Supine Pelvic Tilt  - 1 x daily - 7 x weekly - 3 sets - 10 reps - Supine Single Knee to Chest Stretch  - 2 x daily - 7 x weekly - 1 sets - 5 reps - 10 hold - Supine Lower Trunk Rotation  - 1 x daily - 7 x weekly - 3 sets - 10 reps - Supine Bridge with Mini Swiss Ball Between Knees  - 1 x daily - 7 x weekly - 3 sets - 10 reps - Sit to stand with sink support Movement snack  - 1 x daily - 7 x weekly - 3 sets - 10 reps - Supine Cervical Retraction with Towel  - 1 x daily - 7 x weekly - 1 sets - 10 reps - 10 sec hold - Standing Shoulder Diagonal Horizontal Abduction 60/120 Degrees with Resistance  - 1 x daily - 7 x weekly - 3 sets - 10 reps - Standing Shoulder Horizontal Abduction with Resistance  - 1 x daily - 7 x weekly - 3 sets - 10 reps - childs pose stretch forward and to side  - 1 x daily - 7 x weekly - 1 sets - 6 reps - 15-20 sec hold - Supine90/90 abdominal Bracing resting on couch  - 1 x daily - 7 x weekly - 1 sets - 5 reps - 10-15 hold - Standing Hip Hinge with Dowel  - 1 x daily - 7 x weekly - 3 sets - 10 reps - Deadlift on elevated 8 inch box with 10 lb  - 1 x daily - 3 x weekly - 1 sets - 6 reps ASSESSMENT:  CLINICAL IMPRESSION: Pt returns to clinic with 0/10 pain but did have 8/10 pain yesterday at end of day with the rain. Pt educated on elongation of spine in sitting and standing and education on osteoporosis with handout. Also pt given HEP for postural control for osteoporosis in supine added to HEP with separated  handout. Pt also educated on how to garden with appropriate  Ambulance person. STG all achieved and aquatics deferred at this time. Exercise are improving pain level but still learning routine and incorporating more strengthening into program        EVAL-Patient is a 68 y.o. female who was seen today for physical therapy evaluation and treatment for thoracic and lumbar pain due to scoliosis and sedentary lifestyle. Pt does not report any routine exercise and does not walk more than 5 minutes. Pt is caring for terminally ill husband and needs to improve strength, and pain control in order to perform household duties and caring for husband. Pt will also benefit from aquatic therapy and skilled PT to improve impairments   OBJECTIVE IMPAIRMENTS: decreased activity tolerance, decreased mobility, difficulty walking, decreased ROM, decreased strength, impaired flexibility, improper body mechanics, postural dysfunction, obesity, pain, and scoliosis C curve .   ACTIVITY LIMITATIONS: carrying, lifting, bending, sitting, standing, squatting, stairs, locomotion level, and caring for others  PARTICIPATION LIMITATIONS: meal prep, cleaning, laundry, driving, and community activity  PERSONAL FACTORS: Hx of bipolar disorder, anxiety,ADD, Osteopenia, OSA, pelvic fx 2020, tripping, GERD, tardive dyskinesia, Bil THR are also affecting patient's functional outcome.   REHAB POTENTIAL: Good  CLINICAL DECISION MAKING: Evolving/moderate complexity  EVALUATION COMPLEXITY: Moderate   GOALS: Goals reviewed with patient? Yes  SHORT TERM GOALS: Target date: 01-23-24  Pt will demonstrate appropriate understanding and performance of initially prescribed HEP in order to facilitate improved independence with management of symptoms Baseline:no knowledge 01-16-24  Education on posture and body mechanics 01-30-24  Osteoporosis education Goal status MET 01-30-24  2.  Pt will perform 5xSTS in <13.0 sec in order to demonstrate reduced fall risk and improved functional independence. (MCID  of 2.3sec) Baseline: 19.47 sec 01-16-24 11:97 sec Goal status: MET  3.  Demonstrate understanding of elongation of spine in sitting posture and be more conscious of position and posture throughout the day.  Baseline: Left C Curve scoliorsis Goal status: MET 01-28-24  4.  Begin Aquatic therapy for management of movement and pain control Baseline: No knowledge Goal status:Deferred   LONG TERM GOALS: Target date: 02-20-24  Pt will be I with advanced HEP Baseline: no knowledge Goal status: INITIAL  2.  Demonstrate and verbalize techniques to reduce the risk of re-injury including: lifting, posture, body mechanics mindful of scoliosis/  Baseline:  Goal status: INITIAL  3.  Megan Whitney will show a >/= 12 pt improvement in their ODI score (MCID is 12 pts) as a proxy for functional improvement Baseline: 52 % Goal status: INITIAL  4.  Pt will demonstrate increase lumbar and thoracic strength by being able to carry 5- 10 lb bird seed bag without exacerbating thoracic /lumbar pain greater than 3/10 Baseline: at worst 9/10 Goal status: INITIAL  5.  Pt will be able to perform 6 MWT within normal limits for age 4 ft to 1905 ft Baseline: 351 ft Goal status: INITIAL  6.  Pt will improve endurance by walking at least 20 minutes 3 times a week Baseline: No current routine exercise. Does not walk greater than 5 min Goal status: INITIAL  PLAN:  PT FREQUENCY: 1-2x/week  PT DURATION: 8 weeks  PLANNED INTERVENTIONS: 97164- PT Re-evaluation, 97750- Physical Performance Testing, 97110-Therapeutic exercises, 97530- Therapeutic activity, 97112- Neuromuscular re-education, 97535- Self Care, 96045- Manual therapy, [phone]- Gait training, [phone]- Aquatic Therapy, [phone]- Electrical stimulation (manual), Patient/Family education, Balance training, Stair training, Taping, Dry Needling, Joint mobilization, Spinal mobilization, Cryotherapy, and  Moist heat.  PLAN FOR NEXT SESSION: issue HEP, TPDN as needed  for thoracic pain  Sharlet Dawson, PT, ATRIC Certified Exercise Expert for the Aging Adult  01/30/24 4:53 PM Phone: [phone] Fax: [phone]

## 2024-01-31 ENCOUNTER — Encounter: Payer: Self-pay | Admitting: Psychiatry

## 2024-01-31 NOTE — Therapy (Signed)
 OUTPATIENT PHYSICAL THERAPY THORACOLUMBAR TREATMENT   Patient Name: Megan Whitney MRN: [id number] DOB:12/18/1955, 68 y.o., female Today's Date: 02/01/2024  END OF SESSION:  PT End of Session - 02/01/24 0847     Visit Number 5    Number of Visits 16    Date for PT Re-Evaluation 02/20/24    Authorization Type [employer] P[employer] MCR    PT Start Time 0847    PT Stop Time 0930    PT Time Calculation (min) 43 min    Activity Tolerance Patient tolerated treatment well    Behavior During Therapy [facility] for tasks assessed/performed                  Past Medical History:  Diagnosis Date   ADD (attention deficit disorder)    Allergy    Seasonal   Anemia    History of GI blood loss   Anxiety    Arthritis    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    Esophageal stricture    Fracture of superior pubic ramus (HCC) 11/28/2018   GERD (gastroesophageal reflux disease)    Headache(784.0)    Hyperlipidemia    Interstitial cystitis    Joint pain    Lactose intolerance    Lung cancer (HCC) 2002   Neuromuscular disorder (HCC)    Obesity    Osteoarthritis    Osteoporosis    Palpitations    Sleep apnea    Doesn't use a CPAP   Suicidal ideation 01/20/2020   Swallowing difficulty    Tardive dyskinesia    Past Surgical History:  Procedure Laterality Date   BALLOON DILATION  05/16/2012   Procedure: BALLOON DILATION;  Surgeon: Claudette Cue, MD;  Location: [facility] ENDOSCOPY;  Service: Endoscopy;  Laterality: N/A;   BUNIONECTOMY  2011   COLONOSCOPY     ENTEROSCOPY  05/16/2012   Procedure: ENTEROSCOPY;  Surgeon: Claudette Cue, MD;  Location: [facility] ENDOSCOPY;  Service: Endoscopy;  Laterality: N/A;   JOINT REPLACEMENT     right shoulder durgery 25 yrs ago  1988   TOTAL HIP ARTHROPLASTY Bilateral 2006, 2008   bilateral    TUBAL LIGATION  1990   WEDGE RESECTION  2002   lung cancer   Patient Active Problem List   Diagnosis Date Noted   Neuromuscular disorder (HCC)    Attention deficit disorder 09/27/2023   Prediabetes 09/27/2023   DDD (degenerative disc disease), lumbar 03/27/2023   Class 1 obesity due to excess calories without serious comorbidity in adult 11/29/2022   Chronic thoracic back pain 09/22/2022   Neuroleptic-induced tardive dyskinesia 07/20/2022   Intertrigo 05/19/2022   Encounter for general adult medical examination with abnormal findings 03/22/2022   Arthritis of knee 08/17/2021   Polyp of colon 02/04/2021   Seasonal allergies 10/07/2020   Osteopenia 10/07/2020   Gait disturbance 10/07/2020   Hyperlipidemia 06/11/2020   Sleep apnea 06/11/2020   Stricture and stenosis of esophagus 05/16/2012   Hiatal hernia 05/16/2012   Dysphagia 05/15/2012   Depression    Bipolar disorder (HCC)    GERD (gastroesophageal reflux disease) 09/14/2010   History of colonic polyps 09/14/2010    PCP: Meldon Sport, MD   REFERRING PROVIDER: Mort Ards, MD   REFERRING DIAG:   Scoliosis deformity of  spine  Thoracic back pain  Thoracic spondylosis  Rationale for Evaluation and Treatment: Rehabilitation  THERAPY DIAG:  Chronic bilateral thoracic back pain  Abnormal posture  Muscle weakness (generalized)  Chronic low back pain without sciatica, unspecified back pain laterality  ONSET DATE: Winter 2024 pain  SUBJECTIVE:                                                                                                                                                                                           SUBJECTIVE STATEMENT: I am doing better today and have 0/10 pain and am looking forward to learning how to lift safely      EVAL- I have scoliosis and I need strengthening in my core.  I  thought I was only going to do aquatics. I presently do not have pain but I have had pain  especially early last year.  I am worried about the sharp pains that I have in my back and when I vaccuum and mop. I can stand < 5 minutes especially later in the day when I am tired. No issues with sleeping. Anything later in the afternoon I have pain sitting/driving, standing longer than 5 minutes  I can walk for 10 minutes and then pain.Pain limits me from socializing and my husband currently has terminal cancer. I am shaky from medication, Pt does not report doing any consistent exercise.  Pt has been increasing pain since last year.    PERTINENT HISTORY:  Hx of bipolar disorder, anxiety,ADD, Osteopenia, OSA, pelvic fx 2020, tripping, GERD, tardive dyskinesia, Bil THR  PAIN:  Are you having pain? Yes: NPRS scale: at rest 0/10 at worst 9/10 and later in day Pain location: thoracic and low back and especially on Right Pain description: aching Aggravating factors: standing later in day. Household chores. Bending over to get laundry, mopping and vacuuming outside changing the bird feeder with 5lb -10 lb bag Relieving factors: heating pad, medication once in a while   PRECAUTIONS: None  RED FLAGS: None   WEIGHT BEARING RESTRICTIONS: No  FALLS:  Has patient fallen in last 6 months? No  LIVING ENVIRONMENT: Lives with: lives with their spouse Lives in: House/apartment Stairs: steps outside and a ramp Has following equipment at home: None  OCCUPATION: retired  PLOF: Independent  PATIENT GOALS: Pt would like to be able to be stronger  NEXT MD VISIT: TBD  OBJECTIVE:  Note: Objective measures were completed at Evaluation unless otherwise noted.  DIAGNOSTIC FINDINGS:  See medical record Lumbar radiographs from 2024 shows mild curvature of the spine, mild multilevel degenerative changes and grade 1 anterolisthesis of L4 on L5  PATIENT SURVEYS:  Modified Oswestry 52  02-01-24 16%  COGNITION: Overall cognitive status: Within functional limits for tasks assessed     SENSATION: WFL  has tardive dyskinesia  MUSCLE LENGTH: Hamstrings: Right bil tightness   POSTURE: rounded shoulders, decreased lumbar lordosis, increased thoracic kyphosis, weight shift left, and scoliotic curve C curve ot left Left convex side  PALPATION: TTP over thoracic right spine  LUMBAR ROM:   *did not test forward flexion due to osteopenia /self report of osteoporosis  AROM eval 02-01-24  Flexion  flexed at 20 degrees in stance Fingertips to ankle  Extension  Only to neutral 10 degrees " feels good  Right lateral flexion 50% 75%  Left lateral flexion 75% limited * 75%  Right rotation    Left rotation    Key: [facility] = within functional limits not formally assessed, * = concordant pain, s = stiffness/stretching sensation, NT = not tested)     LOWER EXTREMITY ROM:     Active  Right eval Left eval R/L 02-01-24  Hip flexion 4 4 4/4  Hip extension 4- 4- /44  Hip abduction 4- 3+ 4/4-  Hip adduction     Hip internal rotation     Hip external rotation     Knee flexion 4 5   Knee extension 5 5   Ankle dorsiflexion 4 4   Ankle plantarflexion     Ankle inversion     Ankle eversion     Key: WFL = within functional limits not formally assessed, * = concordant pain, s = stiffness/stretching sensation, NT = not tested)     LOWER EXTREMITY MMT:    MMT Right eval Left eval  Hip flexion 5 4  Hip extension 4- 4-  Hip abduction 4- 4-  Hip adduction    Hip internal rotation    Hip external rotation    Knee flexion 4 5  Knee extension 5 5  Ankle dorsiflexion 4 4  Ankle plantarflexion    Ankle inversion    Ankle eversion    (Blank rows = not tested, score listed is out of 5 possible points.  N = WNL, D = diminished, C = clear for gross weakness with myotome testing, * = concordant pain with testing)   LUMBAR SPECIAL TESTS:  Straight leg raise test: Negative and Slump test: Negative  FUNCTIONAL TESTS:  5 times sit to stand: 19.47 2 minute walk test: 351 ft   Single leg stance  LT 2  sec  RT 0 sec   01-04-24  1154 ft (1500-1905 ft) 01-16-24  5 X STS 11.67 sec GAIT: Distance walked: 351 ft Assistive device utilized: None Level of assistance: Complete Independence Comments: Pt leans to left while walking,   [facility] Adult PT Treatment:                                                DATE: 02-01-24 ODI 16 % Therapeutic Exercise: Wall lift off 2 x 8 Bridge with ball 2 x 10 Heel raise with stomp 2 x 10 Wall slide 10 x 10 sec hold  Therapeutic Activity: Lifting principles with demo lifting on  elevated 6 inch step Hip hinge practice Hip hinge with KB handle touch x 10 Deadlift from elevated 6 inch step backwards Deadlift from elevated 6 inch step forwards 3x8  with extension between sets  [facility] Adult PT Treatment:                                                DATE: 01-30-24 Therapeutic Exercise: Osteoporosis  postural  correction with 1) lying in decompression Exercise for 5 minutes with deep breathing 2) shoulder press hold 2-3 sec and repeat 5 x 3) Head press hold 2-3 seconds repeat 5 x 4) leg press 2-3 seconds 6 x each leg 5) leg lengthening 2-3 seconds 6 x each leg  Therapeutic Activity: Hip hinge with dowel rod on 2 points due to scoliosis mid back and sacral area Sit to stand x 10  Self Care: Osteoporosis education given 2/3 of handout for education about avoiding sitting and twisting and standing and extension and twisting Simulating gardening scenarios and problem solving with osteoporosis precautions Posture and body mechanic education   [facility] Adult PT Treatment:                                                DATE: 01-16-24 Standing exercises and deadlift safety Therapeutic Exercise: Chin tuck exercise 10 x 10 sec 90 90 abdominal setting.  Physioball bridge Hip hinge with dowel Therapeutic Activity: 5 x STS  11.97 sec STS with !5 # KB  3 x 10 Proper lifting procedure for weight Deadlift backwards on elevated 8 inch box x 6  Deadlift forward on  elevated  8 in box x 6  Self Care: Education on posture and body mechanics  Education on using body mechanics around household chores with handouts [facility] Adult PT Treatment:                                                DATE: 01-11-24 Therapeutic Exercise: Supine pelvic tilt 20 x with VC and TC for correct execution STKC on Right and Left 5 x for 10 sec each Bridge with ball 2 x 10 LTR Star pattern Green T band 3 x 10 with chin tuck Chin tuck x 10 for 10 sec each Child's pose stretch forward , and to sides added to HEP  Therapeutic Activity: STS with 5 lb DB 2  x 10 Reverse lunge x 5 each side  Self Care: DOMs education and progressive strengthening and need for good nutrition and hydration  [facility] Adult PT Treatment:                                                DATE: 01-04-24 Therapeutic Exercise: Supine pelvic tilt 20 x with VC and TC for correct execution STKC on Right and Left 5 x for 10 sec each LTR Bridge with ball 2 x 10 Star pattern Red T band 3 x 10 with chin tuck Chin tuck x 10 for 10 sec each   Therapeutic Activity: STS with 5 lb DB 3 x 10  1154 ft (1500-1905 ft)  Self Care: Walking program education   TREATMENT DATE: eval and issue HEP  Aquatic exercise  therapy information                                                                                                                                 PATIENT EDUCATION:  Education details: POC Explanation of findings HEP issue Aquatic therapy information Person educated: Patient Education method: Explanation, Demonstration, Tactile cues, Verbal cues, and Handouts Education comprehension: verbalized understanding, returned demonstration, verbal cues required, tactile cues required, and needs further education  HOME EXERCISE PROGRAM: Access Code: [email or web address] updated 02-01-24 URL: [email or web address] Date: 02/01/2024 Prepared by: Sharlet Dawson  Exercises - Supine Pelvic Tilt  - 1 x daily -  7 x weekly - 3 sets - 10 reps - Supine Single Knee to Chest Stretch  - 2 x daily - 7 x weekly - 1 sets - 5 reps - 10 hold - Supine Lower Trunk Rotation  - 1 x daily - 7 x weekly - 3 sets - 10 reps - Supine Bridge with Mini Swiss Ball Between Knees  - 1 x daily - 7 x weekly - 3 sets - 10 reps - Sit to stand with sink support Movement snack  - 1 x daily - 7 x weekly - 3 sets - 10 reps - Supine Cervical Retraction with Towel  - 1 x daily - 7 x weekly - 1 sets - 10 reps - 10 sec hold - Standing Shoulder Diagonal Horizontal Abduction 60/120 Degrees with Resistance  - 1 x daily - 7 x weekly - 3 sets - 10 reps - Standing Shoulder Horizontal Abduction with Resistance  - 1 x daily - 7 x weekly - 3 sets - 10 reps - childs pose stretch forward and to side  - 1 x daily - 7 x weekly - 1 sets - 6 reps - 15-20 sec hold - Supine90/90 abdominal Bracing resting on couch  - 1 x daily - 7 x weekly - 1 sets - 5 reps - 10-15 hold - Standing Hip Hinge with Dowel  - 1 x daily - 7 x weekly - 3 sets - 10 reps - Deadlift on elevated 8 inch box with 10 lb  - 1 x daily - 3 x weekly - 1 sets - 6 reps - Wall Slide with Posterior Pelvic Tilt  - 1 x daily - 7 x weekly - 1 sets - 10 reps - 10 sec hold - Heel Raises with Counter Support  - 1 x daily - 7 x weekly - 3 sets - 10 reps ASSESSMENT:  CLINICAL IMPRESSION: Pt returns to clinic with 0/10 pain and feels stronger with exercise. HEP updated with emphasis on elongated trunk and preparation for lifting.  Pt ODI  16 % and much improved from evaluation. Exercise are improving pain level but still learning routine and incorporating more strengthening into program.   LTG # 3 achieved.     EVAL-Patient is a 68 y.o. female who was seen today for physical therapy evaluation and  treatment for thoracic and lumbar pain due to scoliosis and sedentary lifestyle. Pt does not report any routine exercise and does not walk more than 5 minutes. Pt is caring for terminally ill husband and needs  to improve strength, and pain control in order to perform household duties and caring for husband. Pt will also benefit from aquatic therapy and skilled PT to improve impairments   OBJECTIVE IMPAIRMENTS: decreased activity tolerance, decreased mobility, difficulty walking, decreased ROM, decreased strength, impaired flexibility, improper body mechanics, postural dysfunction, obesity, pain, and scoliosis C curve .   ACTIVITY LIMITATIONS: carrying, lifting, bending, sitting, standing, squatting, stairs, locomotion level, and caring for others  PARTICIPATION LIMITATIONS: meal prep, cleaning, laundry, driving, and community activity  PERSONAL FACTORS: Hx of bipolar disorder, anxiety,ADD, Osteopenia, OSA, pelvic fx 2020, tripping, GERD, tardive dyskinesia, Bil THR are also affecting patient's functional outcome.   REHAB POTENTIAL: Good  CLINICAL DECISION MAKING: Evolving/moderate complexity  EVALUATION COMPLEXITY: Moderate   GOALS: Goals reviewed with patient? Yes  SHORT TERM GOALS: Target date: 01-23-24  Pt will demonstrate appropriate understanding and performance of initially prescribed HEP in order to facilitate improved independence with management of symptoms Baseline:no knowledge 01-16-24  Education on posture and body mechanics 01-30-24  Osteoporosis education Goal status MET 01-30-24  2.  Pt will perform 5xSTS in <13.0 sec in order to demonstrate reduced fall risk and improved functional independence. (MCID of 2.3sec) Baseline: 19.47 sec 01-16-24 11:97 sec Goal status: MET  3.  Demonstrate understanding of elongation of spine in sitting posture and be more conscious of position and posture throughout the day.  Baseline: Left C Curve scoliorsis Goal status: MET 01-28-24  4.  Begin Aquatic therapy for management of movement and pain control Baseline: No knowledge Goal status:Deferred   LONG TERM GOALS: Target date: 02-20-24  Pt will be I with advanced HEP Baseline: no  knowledge Goal status:ONGOING  2.  Demonstrate and verbalize techniques to reduce the risk of re-injury including: lifting, posture, body mechanics mindful of scoliosis/  Baseline:  02-01-24  completed education on scoliosis and precautians Goal status:ONGOING  3.  Nadie will show a >/= 12 pt improvement in their ODI score (MCID is 12 pts) as a proxy for functional improvement Baseline: 52 % 02-01-24  16% Goal status: MET  4.  Pt will demonstrate increase lumbar and thoracic strength by being able to carry 5- 10 lb bird seed bag without exacerbating thoracic /lumbar pain greater than 3/10 Baseline: at worst 9/10 02-01-24  learning deadlift on elevated step 6 inches Goal status:ONGOING  5.  Pt will be able to perform 6 MWT within normal limits for age 42 ft to 1905 ft Baseline: 351 ft Goal status: INITIAL  6.  Pt will improve endurance by walking at least 20 minutes 3 times a week Baseline: No current routine exercise. Does not walk greater than 5 min Goal status: INITIAL  PLAN:  PT FREQUENCY: 1-2x/week  PT DURATION: 8 weeks  PLANNED INTERVENTIONS: [id number]- PT Re-evaluation, 97750- Physical Performance Testing, 97110-Therapeutic exercises, 97530- Therapeutic activity, [id number]- Neuromuscular re-education, 97535- Self Care, 96045- Manual therapy, [phone]- Gait training, [phone]- Aquatic Therapy, [phone]- Electrical stimulation (manual), Patient/Family education, Balance training, Stair training, Taping, Dry Needling, Joint mobilization, Spinal mobilization, Cryotherapy, and Moist heat.  PLAN FOR NEXT SESSION: issue HEP, TPDN as needed for thoracic pain  Sharlet Dawson, PT, ATRIC Certified Exercise Expert for the Aging Adult  02/01/24 11:34 AM Phone: [phone] Fax: [phone]

## 2024-02-01 ENCOUNTER — Ambulatory Visit: Admitting: Physical Therapy

## 2024-02-01 ENCOUNTER — Encounter: Payer: Self-pay | Admitting: Physical Therapy

## 2024-02-01 DIAGNOSIS — M6281 Muscle weakness (generalized): Secondary | ICD-10-CM

## 2024-02-01 DIAGNOSIS — R293 Abnormal posture: Secondary | ICD-10-CM

## 2024-02-01 DIAGNOSIS — G8929 Other chronic pain: Secondary | ICD-10-CM

## 2024-02-01 DIAGNOSIS — M546 Pain in thoracic spine: Secondary | ICD-10-CM | POA: Diagnosis not present

## 2024-02-01 DIAGNOSIS — M545 Low back pain, unspecified: Secondary | ICD-10-CM

## 2024-02-05 ENCOUNTER — Ambulatory Visit: Attending: Orthopedic Surgery | Admitting: Physical Therapy

## 2024-02-05 DIAGNOSIS — M6281 Muscle weakness (generalized): Secondary | ICD-10-CM | POA: Insufficient documentation

## 2024-02-05 DIAGNOSIS — M546 Pain in thoracic spine: Secondary | ICD-10-CM | POA: Diagnosis not present

## 2024-02-05 DIAGNOSIS — R293 Abnormal posture: Secondary | ICD-10-CM | POA: Diagnosis not present

## 2024-02-05 DIAGNOSIS — G8929 Other chronic pain: Secondary | ICD-10-CM | POA: Insufficient documentation

## 2024-02-05 NOTE — Therapy (Signed)
 OUTPATIENT PHYSICAL THERAPY THORACOLUMBAR TREATMENT   Patient Name: Megan Whitney MRN: [id number] DOB:[birth year], 68 y.o., female Today's Date: 02/05/2024  END OF SESSION:  PT End of Session - 02/05/24 1122     Visit Number 6    Number of Visits 16    Date for PT Re-Evaluation 02/20/24    Authorization Type [employer] P[employer] MCR    PT Start Time 1015    PT Stop Time 1100    PT Time Calculation (min) 45 min                   Past Medical History:  Diagnosis Date   ADD (attention deficit disorder)    Allergy    Seasonal   Anemia    History of GI blood loss   Anxiety    Arthritis    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    Esophageal stricture    Fracture of superior pubic ramus (HCC) 11/28/2018   GERD (gastroesophageal reflux disease)    Headache(784.0)    Hyperlipidemia    Interstitial cystitis    Joint pain    Lactose intolerance    Lung cancer (HCC) 2002   Neuromuscular disorder (HCC)    Obesity    Osteoarthritis    Osteoporosis    Palpitations    Sleep apnea    Doesn't use a CPAP   Suicidal ideation 01/20/2020   Swallowing difficulty    Tardive dyskinesia    Past Surgical History:  Procedure Laterality Date   BALLOON DILATION  05/16/2012   Procedure: BALLOON DILATION;  Surgeon: Claudette Cue, MD;  Location: [facility] ENDOSCOPY;  Service: Endoscopy;  Laterality: N/A;   BUNIONECTOMY  2011   COLONOSCOPY     ENTEROSCOPY  05/16/2012   Procedure: ENTEROSCOPY;  Surgeon: Claudette Cue, MD;  Location: [facility] ENDOSCOPY;  Service: Endoscopy;  Laterality: N/A;   JOINT REPLACEMENT     right shoulder durgery 25 yrs ago  1988   TOTAL HIP ARTHROPLASTY Bilateral 2006, 2008   bilateral   TUBAL LIGATION  1990   WEDGE RESECTION  2002   lung cancer   Patient Active Problem List   Diagnosis Date Noted    Neuromuscular disorder (HCC)    Attention deficit disorder 09/27/2023   Prediabetes 09/27/2023   DDD (degenerative disc disease), lumbar 03/27/2023   Class 1 obesity due to excess calories without serious comorbidity in adult 11/29/2022   Chronic thoracic back pain 09/22/2022   Neuroleptic-induced tardive dyskinesia 07/20/2022   Intertrigo 05/19/2022   Encounter for general adult medical examination with abnormal findings 03/22/2022   Arthritis of knee 08/17/2021   Polyp of colon 02/04/2021   Seasonal allergies 10/07/2020   Osteopenia 10/07/2020   Gait disturbance 10/07/2020   Hyperlipidemia 06/11/2020   Sleep apnea 06/11/2020   Stricture and stenosis of esophagus 05/16/2012   Hiatal hernia 05/16/2012   Dysphagia 05/15/2012   Depression    Bipolar disorder (HCC)    GERD (gastroesophageal reflux disease) 09/14/2010   History of colonic polyps 09/14/2010    PCP: Meldon Sport, MD   REFERRING PROVIDER: Mort Ards, MD   REFERRING DIAG:   Scoliosis deformity of spine  Thoracic back pain  Thoracic spondylosis  Rationale for Evaluation and Treatment: Rehabilitation  THERAPY DIAG:  Chronic bilateral thoracic back pain  Abnormal posture  Muscle weakness (generalized)  ONSET DATE: Winter 2024 pain  SUBJECTIVE:                                                                                                                                                                                           SUBJECTIVE STATEMENT: I am doing better today and have 0/10 pain and am looking forward to learning how to lift safely      EVAL- I have scoliosis and I need strengthening in my core.  I  thought I was only going to do aquatics. I presently do not have pain but I have had pain especially early last year.  I am worried about the sharp pains that I have in my back and when I vaccuum and mop. I can stand < 5 minutes especially later in the day when I am tired. No issues with  sleeping. Anything later in the afternoon I have pain sitting/driving, standing longer than 5 minutes  I can walk for 10 minutes and then pain.Pain limits me from socializing and my husband currently has terminal cancer. I am shaky from medication, Pt does not report doing any consistent exercise.  Pt has been increasing pain since last year.    PERTINENT HISTORY:  Hx of bipolar disorder, anxiety,ADD, Osteopenia, OSA, pelvic fx 2020, tripping, GERD, tardive dyskinesia, Bil THR  PAIN:  Are you having pain? Yes: NPRS scale: at rest 0/10 at worst 9/10 and later in day Pain location: thoracic and low back and especially on Right Pain description: aching Aggravating factors: standing later in day. Household chores. Bending over to get laundry, mopping and vacuuming outside changing the bird feeder with 5lb -10 lb bag Relieving factors: heating pad, medication once in a while   PRECAUTIONS: None  RED FLAGS: None   WEIGHT BEARING RESTRICTIONS: No  FALLS:  Has patient fallen in last 6 months? No  LIVING ENVIRONMENT: Lives with: lives with their spouse Lives in: House/apartment Stairs: steps outside and a ramp Has following equipment at home: None  OCCUPATION: retired  PLOF: Independent  PATIENT GOALS: Pt would like to be able to be stronger  NEXT MD VISIT: TBD  OBJECTIVE:  Note: Objective measures were completed at Evaluation unless otherwise noted.  DIAGNOSTIC FINDINGS:  See medical record Lumbar radiographs from 2024 shows mild curvature of the spine, mild multilevel degenerative changes and grade 1 anterolisthesis of L4 on L5  PATIENT SURVEYS:  Modified Oswestry 52  02-01-24 16%  COGNITION: Overall cognitive status: Within functional limits for tasks assessed     SENSATION: WFL has tardive dyskinesia  MUSCLE LENGTH: Hamstrings: Right bil tightness   POSTURE: rounded shoulders, decreased lumbar lordosis, increased thoracic kyphosis, weight shift left, and scoliotic  curve C curve ot left Left convex side  PALPATION: TTP over thoracic right spine  LUMBAR ROM:   *did not test forward flexion due to osteopenia /self report of osteoporosis  AROM eval 02-01-24  Flexion  flexed at 20 degrees in stance Fingertips to ankle  Extension  Only to neutral 10 degrees " feels good  Right lateral flexion 50% 75%  Left lateral flexion 75% limited * 75%  Right rotation    Left rotation    Key: [facility] = within functional limits not formally assessed, * = concordant pain, s = stiffness/stretching sensation, NT = not tested)     LOWER EXTREMITY ROM:     Active  Right eval Left eval R/L 02-01-24  Hip flexion 4 4 4/4  Hip extension 4- 4- /44  Hip abduction 4- 3+ 4/4-  Hip adduction     Hip internal rotation     Hip external rotation     Knee flexion 4 5   Knee extension 5 5   Ankle dorsiflexion 4 4   Ankle plantarflexion     Ankle inversion     Ankle eversion     Key: WFL = within functional limits not formally assessed, * = concordant pain, s = stiffness/stretching sensation, NT = not tested)     LOWER EXTREMITY MMT:    MMT Right eval Left eval  Hip flexion 5 4  Hip extension 4- 4-  Hip abduction 4- 4-  Hip adduction    Hip internal rotation    Hip external rotation    Knee flexion 4 5  Knee extension 5 5  Ankle dorsiflexion 4 4  Ankle plantarflexion    Ankle inversion    Ankle eversion    (Blank rows = not tested, score listed is out of 5 possible points.  N = WNL, D = diminished, C = clear for gross weakness with myotome testing, * = concordant pain with testing)   LUMBAR SPECIAL TESTS:  Straight leg raise test: Negative and Slump test: Negative  FUNCTIONAL TESTS:  5 times sit to stand: 19.47 2 minute walk test: 351 ft   Single leg stance  LT 2 sec  RT 0 sec   01-04-24  1154 ft (1500-1905 ft) 01-16-24  5 X STS 11.67 sec GAIT: Distance walked: 351 ft Assistive device utilized: None Level of assistance: Complete  Independence Comments: Pt leans to left while walking,   [facility] Adult PT Treatment:                                                DATE: 02/05/24  Heel raise with stomp 10 x 2 - pt needs hand out for this- lost it Wall lift off 2 x 8  Wall slide 10 x 10 sec hold  Bridge with ball squeeze x 10 90/90 ab hold 10 sec x 3  SKTC x 5  LTR x 10 Min cues for below:  Hip hinge with KB handle touch x 5 at 6 inch, 4 inch and 0 inches from floor KB lift x 8 from 6 inch , 4 inch and 0 inches from floor x 8 at each level - with extension between sets  Updated HEP into sections  [facility] Adult PT Treatment:                                                DATE: 02-01-24 ODI 16 % Therapeutic Exercise: Wall lift off 2 x 8 Bridge with ball 2 x 10 Heel raise with stomp 2 x 10 Wall slide 10 x 10 sec hold  Therapeutic Activity: Lifting principles with demo lifting on  elevated 6 inch step Hip hinge practice Hip hinge with KB handle touch x 10 Deadlift from elevated 6 inch step backwards Deadlift from elevated 6 inch step forwards 3x8  with extension between sets  [facility] Adult PT Treatment:                                                DATE: 01-30-24 Therapeutic Exercise: Osteoporosis  postural  correction with 1) lying in decompression Exercise for 5 minutes with deep breathing 2) shoulder press hold 2-3 sec and repeat 5 x 3) Head press hold 2-3 seconds repeat 5 x 4) leg press 2-3 seconds 6 x each leg 5) leg lengthening 2-3 seconds 6 x each leg  Therapeutic Activity: Hip hinge with dowel rod on 2 points due to scoliosis mid back and sacral area Sit to stand x 10  Self Care: Osteoporosis education given 2/3 of handout for education about avoiding sitting and twisting and standing and extension and twisting Simulating gardening scenarios and problem solving with osteoporosis precautions Posture and body mechanic education   [facility] Adult PT Treatment:                                                DATE:  01-16-24 Standing exercises and deadlift safety Therapeutic Exercise: Chin tuck exercise 10 x 10 sec 90 90 abdominal setting.  Physioball bridge Hip hinge with dowel Therapeutic Activity: 5 x STS  11.97 sec STS with !5 # KB  3 x 10 Proper lifting procedure for weight Deadlift backwards on elevated 8 inch box x 6  Deadlift forward on elevated  8 in box x 6  Self Care: Education on posture and body mechanics  Education on using body mechanics around household chores with handouts [facility] Adult PT Treatment:                                                DATE: 01-11-24 Therapeutic Exercise: Supine pelvic tilt 20 x with VC and TC for correct execution STKC on Right and Left 5 x for 10 sec each Bridge with ball 2 x 10 LTR Star pattern Green T band 3 x 10 with chin tuck Chin tuck x 10 for 10 sec each Child's pose stretch forward , and to sides added to HEP  Therapeutic Activity: STS with 5 lb DB 2  x 10 Reverse lunge x 5 each side  Self Care: DOMs education and progressive strengthening and need for good nutrition and hydration  [facility] Adult PT Treatment:  DATE: 01-04-24 Therapeutic Exercise: Supine pelvic tilt 20 x with VC and TC for correct execution STKC on Right and Left 5 x for 10 sec each LTR Bridge with ball 2 x 10 Star pattern Red T band 3 x 10 with chin tuck Chin tuck x 10 for 10 sec each   Therapeutic Activity: STS with 5 lb DB 3 x 10  1154 ft (1500-1905 ft)  Self Care: Walking program education   TREATMENT DATE: eval and issue HEP  Aquatic exercise therapy information                                                                                                                                 PATIENT EDUCATION:  Education details: POC Explanation of findings HEP issue Aquatic therapy information Person educated: Patient Education method: Explanation, Demonstration, Tactile cues, Verbal cues, and  Handouts Education comprehension: verbalized understanding, returned demonstration, verbal cues required, tactile cues required, and needs further education  HOME EXERCISE PROGRAM: Access Code: [email or web address] updated 02-01-24 URL: [email or web address] Date: 02/01/2024 Prepared by: Sharlet Dawson  Exercises - Supine Pelvic Tilt  - 1 x daily - 7 x weekly - 3 sets - 10 reps - Supine Single Knee to Chest Stretch  - 2 x daily - 7 x weekly - 1 sets - 5 reps - 10 hold - Supine Lower Trunk Rotation  - 1 x daily - 7 x weekly - 3 sets - 10 reps - Supine Bridge with Mini Swiss Ball Between Knees  - 1 x daily - 7 x weekly - 3 sets - 10 reps - Sit to stand with sink support Movement snack  - 1 x daily - 7 x weekly - 3 sets - 10 reps - Supine Cervical Retraction with Towel  - 1 x daily - 7 x weekly - 1 sets - 10 reps - 10 sec hold - Standing Shoulder Diagonal Horizontal Abduction 60/120 Degrees with Resistance  - 1 x daily - 7 x weekly - 3 sets - 10 reps - Standing Shoulder Horizontal Abduction with Resistance  - 1 x daily - 7 x weekly - 3 sets - 10 reps - childs pose stretch forward and to side  - 1 x daily - 7 x weekly - 1 sets - 6 reps - 15-20 sec hold - Supine90/90 abdominal Bracing resting on couch  - 1 x daily - 7 x weekly - 1 sets - 5 reps - 10-15 hold - Standing Hip Hinge with Dowel  - 1 x daily - 7 x weekly - 3 sets - 10 reps - Deadlift on elevated 8 inch box with 10 lb  - 1 x daily - 3 x weekly - 1 sets - 6 reps - Wall Slide with Posterior Pelvic Tilt  - 1 x daily - 7 x weekly - 1 sets - 10 reps - 10 sec hold - Heel Raises with Counter Support  - 1 x daily -  7 x weekly - 3 sets - 10 reps ASSESSMENT:  CLINICAL IMPRESSION: Pt returns to clinic with 0/10 pain , reports pain still elevates as the day goes on. Pt reports she lost some of her HEP from last session and these were reviewed. Pt was provided with HEP slit into sections for easier completion. Continued with lifting progression,  pt able to lift 5# from various heights with min verbal cues.       EVAL-Patient is a 68 y.o. female who was seen today for physical therapy evaluation and treatment for thoracic and lumbar pain due to scoliosis and sedentary lifestyle. Pt does not report any routine exercise and does not walk more than 5 minutes. Pt is caring for terminally ill husband and needs to improve strength, and pain control in order to perform household duties and caring for husband. Pt will also benefit from aquatic therapy and skilled PT to improve impairments   OBJECTIVE IMPAIRMENTS: decreased activity tolerance, decreased mobility, difficulty walking, decreased ROM, decreased strength, impaired flexibility, improper body mechanics, postural dysfunction, obesity, pain, and scoliosis C curve .   ACTIVITY LIMITATIONS: carrying, lifting, bending, sitting, standing, squatting, stairs, locomotion level, and caring for others  PARTICIPATION LIMITATIONS: meal prep, cleaning, laundry, driving, and community activity  PERSONAL FACTORS: Hx of bipolar disorder, anxiety,ADD, Osteopenia, OSA, pelvic fx 2020, tripping, GERD, tardive dyskinesia, Bil THR are also affecting patient's functional outcome.   REHAB POTENTIAL: Good  CLINICAL DECISION MAKING: Evolving/moderate complexity  EVALUATION COMPLEXITY: Moderate   GOALS: Goals reviewed with patient? Yes  SHORT TERM GOALS: Target date: 01-23-24  Pt will demonstrate appropriate understanding and performance of initially prescribed HEP in order to facilitate improved independence with management of symptoms Baseline:no knowledge 01-16-24  Education on posture and body mechanics 01-30-24  Osteoporosis education Goal status MET 01-30-24  2.  Pt will perform 5xSTS in <13.0 sec in order to demonstrate reduced fall risk and improved functional independence. (MCID of 2.3sec) Baseline: 19.47 sec 01-16-24 11:97 sec Goal status: MET  3.  Demonstrate understanding of elongation of  spine in sitting posture and be more conscious of position and posture throughout the day.  Baseline: Left C Curve scoliorsis Goal status: MET 01-28-24  4.  Begin Aquatic therapy for management of movement and pain control Baseline: No knowledge Goal status:Deferred   LONG TERM GOALS: Target date: 02-20-24  Pt will be I with advanced HEP Baseline: no knowledge Goal status:ONGOING  2.  Demonstrate and verbalize techniques to reduce the risk of re-injury including: lifting, posture, body mechanics mindful of scoliosis/  Baseline:  02-01-24  completed education on scoliosis and precautians Goal status:ONGOING  3.  Vernadette will show a >/= 12 pt improvement in their ODI score (MCID is 12 pts) as a proxy for functional improvement Baseline: 52 % 02-01-24  16% Goal status: MET  4.  Pt will demonstrate increase lumbar and thoracic strength by being able to carry 5- 10 lb bird seed bag without exacerbating thoracic /lumbar pain greater than 3/10 Baseline: at worst 9/10 02-01-24  learning deadlift on elevated step 6 inches Goal status:ONGOING  5.  Pt will be able to perform 6 MWT within normal limits for age 5 ft to 1905 ft Baseline: 351 ft Goal status: INITIAL  6.  Pt will improve endurance by walking at least 20 minutes 3 times a week Baseline: No current routine exercise. Does not walk greater than 5 min Goal status: INITIAL  PLAN:  PT FREQUENCY: 1-2x/week  PT DURATION: 8  weeks  PLANNED INTERVENTIONS: 97164- PT Re-evaluation, 97750- Physical Performance Testing, 97110-Therapeutic exercises, 97530- Therapeutic activity, [id number]- Neuromuscular re-education, 97535- Self Care, 97140- Manual therapy, [phone]- Gait training, [phone]- Aquatic Therapy, [phone]- Electrical stimulation (manual), Patient/Family education, Balance training, Stair training, Taping, Dry Needling, Joint mobilization, Spinal mobilization, Cryotherapy, and Moist heat.  PLAN FOR NEXT SESSION: issue HEP, TPDN as needed  for thoracic pain  Gasper Karst, PTA 02/05/24 11:23 AM Phone: [phone] Fax: [phone]

## 2024-02-06 ENCOUNTER — Encounter: Payer: Self-pay | Admitting: Psychiatry

## 2024-02-06 ENCOUNTER — Telehealth: Payer: Self-pay | Admitting: Psychiatry

## 2024-02-06 ENCOUNTER — Ambulatory Visit (INDEPENDENT_AMBULATORY_CARE_PROVIDER_SITE_OTHER): Admitting: Psychiatry

## 2024-02-06 DIAGNOSIS — F411 Generalized anxiety disorder: Secondary | ICD-10-CM

## 2024-02-06 DIAGNOSIS — R7989 Other specified abnormal findings of blood chemistry: Secondary | ICD-10-CM

## 2024-02-06 DIAGNOSIS — G3184 Mild cognitive impairment, so stated: Secondary | ICD-10-CM

## 2024-02-06 DIAGNOSIS — F900 Attention-deficit hyperactivity disorder, predominantly inattentive type: Secondary | ICD-10-CM

## 2024-02-06 DIAGNOSIS — F5105 Insomnia due to other mental disorder: Secondary | ICD-10-CM

## 2024-02-06 DIAGNOSIS — G2401 Drug induced subacute dyskinesia: Secondary | ICD-10-CM | POA: Diagnosis not present

## 2024-02-06 DIAGNOSIS — Z79899 Other long term (current) drug therapy: Secondary | ICD-10-CM

## 2024-02-06 DIAGNOSIS — F3132 Bipolar disorder, current episode depressed, moderate: Secondary | ICD-10-CM

## 2024-02-06 NOTE — Patient Instructions (Signed)
 Increase lamotrigine  to 2 of the 100 mg tablets at night and continue one of the 100 mg tablets in the morning for depression. Increase Austedo XR to 42 mg daily for 1 week, Then increase Austedo XR to 48 mg daily using your 36 mg tablet with the sample 12 mg tablet for 1 week. Then call the office and let us  know how well the Austedo is working.

## 2024-02-06 NOTE — Telephone Encounter (Signed)
 Pt was seen today and dr, cottle  was suppose to send in a new script of the lamotrigine  100 mg 3 x a day. ! In the am and 2 at night. Please send to [facility]

## 2024-02-06 NOTE — Progress Notes (Signed)
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 of the face/tongue/throat, SOB, or low BP? Yes Sudden or severe rash/hives, skin peeling, or the inside of the mouth or nose? Yes Did it require medical treatment? No When did it last happen?       If all above answers are "NO", may proceed with cephalosporin use.  Patient reacts to Z pack.  HAS Taken amoxicillin  fine.   Adhesive [Tape] Other (See Comments)    On bandaids    Past Medical History:  Diagnosis Date   ADD (attention deficit disorder)    Allergy  Seasonal   Anemia    History of GI blood loss   Anxiety    Arthritis    Atrophy of vagina 10/07/2020   Bipolar 1 disorder (HCC)    Cancer (HCC)     Colon polyps    Depression    Edema, lower extremity    Epistaxis    Around 2011 or 2012, required cauterization.    Esophageal stricture    Fracture of superior pubic ramus (HCC) 11/28/2018   GERD (gastroesophageal reflux disease)    Headache(784.0)    Hyperlipidemia    Interstitial cystitis    Joint pain    Lactose intolerance    Lung cancer (HCC) 2002   Neuromuscular disorder (HCC)    Obesity    Osteoarthritis    Osteoporosis    Palpitations    Sleep apnea    Doesn't use a CPAP   Suicidal ideation 01/20/2020   Swallowing difficulty    Tardive dyskinesia     Family History  Problem Relation Age of Onset   Arthritis Mother    Hearing loss Mother    Hyperlipidemia Mother    Hypertension Mother    Depression Mother    Anxiety disorder Mother    Obesity Mother    Sudden death Mother    Hypertension Father    Diabetes Mellitus II Father    Heart disease Father    Arthritis Father    Cancer Father        Brain   COPD Father    Diabetes Father    Hyperlipidemia Father    Sleep apnea Father    Early death Sister        Aneroxia/Bulimic   Depression Brother    Early death Hydrographic surveyor Accident   Stroke Maternal Grandmother    Hypertension Maternal Grandmother    Arthritis Maternal Grandfather    Heart attack Maternal Grandfather    Hearing loss Maternal Grandfather    Depression Daughter    Drug abuse Daughter    Heart disease Daughter    Hypertension Daughter    Colon cancer Neg Hx    Esophageal cancer Neg Hx    Rectal cancer Neg Hx    Colon polyps Neg Hx    Stomach cancer Neg Hx     Social History   Socioeconomic History   Marital status: Married    Spouse name: Not on file   Number of children: 1   Years of education: 12   Highest education level: Not on file  Occupational History   Occupation: retired  Tobacco Use   Smoking status: Never   Smokeless tobacco: Never  Vaping Use   Vaping status: Never Used  Substance and Sexual  Activity   Alcohol use: Not Currently    Alcohol/week: 1.0 standard drink of alcohol    Types: 1 Glasses of wine per week    Comment: 1 glass wine occassionally   Drug use: No   Sexual activity: Yes  Other Topics Concern   Not on file  Social History Narrative   Pt lives in [place] with husband Megan Whitney.  Followed by Megan Whitney for psychiatry and Megan Whitney for therapy.   Right handed   Drinks caffeine   One story home   Married lives with husband   retired   Social Drivers of [employer]: Low Risk  (06/01/2023)   Overall Financial Resource Strain (CARDIA)    Difficulty of Paying  Living Expenses: Not hard at all  Food Insecurity: No Food Insecurity (06/22/2023)   Hunger Vital Sign    Worried About Running Out of Food in the Last Year: Never true    Ran Out of Food in the Last Year: Never true  Transportation Needs: No Transportation Needs (06/22/2023)   PRAPARE - Administrator, Civil Service (Medical): No    Lack of Transportation (Non-Medical): No  Physical Activity: Inactive (06/01/2023)   Exercise Vital Sign    Days of Exercise per Week: 0 days    Minutes of Exercise per Session: 0 min  Stress: Stress Concern Present (06/01/2023)   Megan Whitney of Occupational Health - Occupational Stress Questionnaire    Feeling of Stress : Very much  Social Connections: Socially Integrated (06/01/2023)   Social Connection and Isolation Panel [NHANES]    Frequency of Communication with Friends and Family: More than three times a week    Frequency of Social Gatherings with Friends and Family: More than three times a week    Attends Religious Services: More than 4 times per year    Active Member of [employer] or Organizations: Yes    Attends Banker Meetings: More than 4 times per year    Marital Status: Married  Catering manager Violence: Not At Risk (06/01/2023)   Humiliation, Afraid, Rape, and Kick questionnaire    Fear of Current or  Ex-Partner: No    Emotionally Abused: No    Physically Abused: No    Sexually Abused: No    Past Medical History, Surgical history, Social history, and Family history were reviewed and updated as appropriate.   Please see review of systems for further details on the patient's review from today.   Objective:   Physical Exam:  There were no vitals taken for this visit.  Physical Exam Neurological:     Mental Status: She is alert and oriented to person, place, and time.     Cranial Nerves: No dysarthria.     Gait: Gait normal.     Comments: Lip licking consistent, and some pursing unchanged Good gait  Psychiatric:        Attention and Perception: Attention and perception normal.        Mood and Affect: Mood is anxious and depressed. Affect is not tearful.        Speech: Speech normal.        Behavior: Behavior is not slowed. Behavior is cooperative.        Thought Content: Thought content normal. Thought content is not paranoid or delusional. Thought content does not include homicidal or suicidal ideation. Thought content does not include suicidal plan.        Cognition and Memory: Cognition and memory normal.        Judgment: Judgment normal.     Comments: Insight intact Ongoing residual dep, irritability, anxiety with dep      Lab Review:     Component Value Date/Time   NA 139 08/02/2023 1214   NA 142 03/22/2023 0916   K 4.2 08/02/2023 1214   CL 106 08/02/2023 1214   CO2 26 08/02/2023 1214   GLUCOSE 101 (H) 08/02/2023 1214   BUN 17 08/02/2023 1214   BUN 12 03/22/2023 0916   CREATININE 0.85 08/02/2023 1214   CALCIUM  9.8 08/02/2023 1214   PROT 7.2 03/22/2023 0916   ALBUMIN 4.5 03/22/2023 0916   AST 27 03/22/2023 0916   ALT 14 03/22/2023 0916   ALKPHOS 68 03/22/2023  0916   BILITOT 0.7 03/22/2023 0916   GFRNONAA >60 08/02/2023 1214   GFRAA 96 03/02/2020 1433       Component Value Date/Time   WBC 8.0 08/02/2023 1214   RBC 4.29 08/02/2023 1214   HGB 13.2  08/02/2023 1214   HGB 14.8 03/22/2023 0916   HCT 39.8 08/02/2023 1214   HCT 47.3 (H) 03/22/2023 0916   PLT 188 08/02/2023 1214   PLT 225 03/22/2023 0916   MCV 92.8 08/02/2023 1214   MCV 92 03/22/2023 0916   MCH 30.8 08/02/2023 1214   MCHC 33.2 08/02/2023 1214   RDW 14.8 08/02/2023 1214   RDW 13.8 03/22/2023 0916   LYMPHSABS 3.4 (H) 03/22/2023 0916   MONOABS 0.7 04/04/2019 1004   EOSABS 0.2 03/22/2023 0916   BASOSABS 0.0 03/22/2023 0916  Vitamin D  level 33 on 10K units daily on 12/4/`9 Increased to prescription vitamin d  50K units Monday, Wed, Friday.  Rx sent in.   Lithium  Lvl  Date Value Ref Range Status  10/21/2018 0.18 (L) 0.60 - 1.20 mmol/L Final    Comment:    Performed at [facility], [address], [place], [place] [address]     No results found for: "PHENYTOIN", "PHENOBARB", "VALPROATE", "CBMZ"   .res Assessment: Plan:    Bipolar disorder with moderate depression (HCC)  Generalized anxiety disorder  Attention deficit hyperactivity disorder (ADHD), predominantly inattentive type  Tardive dyskinesia  Mild cognitive impairment  Insomnia due to mental condition  Long term current use of clozapine   Low vitamin D  level  30 min face to face time with patient.. We discussed multiple dxes and concerns.   Vivia has chronic rapid cycling bipolar disorder which is chronically unstable and has been difficult to control.  Failed 14 different mood stabilizers.  The rapid cycling is making it difficult to control frequency of depressive episodes and the anxiety as well.  We have typically had to make frequent med changes.   Disc gradual increase bc med sensitivity.  Med sensitivity to SE seems to be the biggest problem preventing better mood control SX remain TR  Disc options for better control of dep, irritability and anxiety.  Incr Viibryd  vs clozapine  If she can tolerate it the most effective option would be to increase the clozapine  which could help depression,  anxiety and irritability.  But at this time she does not believe she can tolerate a higher dose of clozapine .  Therefore we discussed other options including increasing lamotrigine .  Ingrezza  for TD helped reduce tongue chewing but still some mouth movements at 60 mg BID  for lip licking and pursing.  Shuffling only at night now.  Drooling resolved Stopped ingrezza  DT insurance won't cover BID anymore. In the process of adjusting dosage of Austedo XR by increasing to 42 mg daily this week and then 48 mg next week.  The highest dose it is anticipated given the dose of Ingrezza  she was previously taking.  Disc ECT. Only FDA approved option left. She wants to defer.    Extensive discussion of clozapine  dosing recommendations but we will increase more slowly bc she is so med sensitive.Disc risk low WBC, cardiomyopathy, etc, sedation  (Started clozapine  on 12/07/21).    Check CBC with diff every 4 weeks.  Now.  Option increase clozapine  for anxiety.  Disc SR risk.   For anxiety and irritability, reactivity , mood swings continue clozapine  but switch to 75 mg HS.  She coouldn't tolerate higher dose and so mood swings are worse.  Lately dealing with dep and anxiety but better the last couple of days. Likely to work better than incr Viibryd  but consider latter.  She has a high residual anxiety.  It has been impossible to control all of her symptoms simultaneously without causing side effects. Failed various meds.  Discussed side effects of each medicine. Ok with Increase  Focalin  IR 20 mg BID  for ADD and off label for depression . Try to spread this out if possible for mood.   Could not get Focalin  XR to improve compliance 40 mg AM Discussed potential benefits, risks, and side effects of stimulants with patient to include increased heart rate, palpitations, insomnia, increased anxiety, increased irritability, or decreased appetite.  Instructed patient to contact office if experiencing any significant  tolerability issues. Consider modafinil if necessary  Would like to avoid BZ if possible.  For dep increase Lamotrigine  100 mg Am and 200 mg pm.  Failed all reasonable alternatives for anxiety.  Option Auvelity.  She is under a lot of genuine stress.  Discussed potential metabolic side effects associated with atypical antipsychotics, as well as potential risk for movement side effects. Advised pt to contact office if movement side effects occur.    Checked B12 folate bc memory complaints.  Normal B12 & folate on 05/25/22  Disc SE meds and this is heightened by the complication of necessary polypharmacy.  Continue counseling . It helps Rec exercise as it would help.  Take advantage of family support  Requires frequent FU DT chronic instability.  Wants to schedule monthly.  FU 4 weeks.   Please see After Visit Summary for patient specific instructions. Increase lamotrigine  to 2 of the 100 mg tablets at night and continue one of the 100 mg tablets in the morning for depression. Increase Austedo XR to 42 mg daily for 1 week, Then increase Austedo XR to 48 mg daily using your 36 mg tablet with the sample 12 mg tablet for 1 week. Then call the office and let us  know how well the Austedo is working.  Megan Beat, Megan Whitney, DFAPA     Future Appointments  Date Time Provider Department Center  02/08/2024 10:15 AM Evern Hocking, PT [facility]  02/19/2024  8:45 AM Evern Hocking, PT [facility]  02/27/2024 10:00 AM [name], LCSW CP-CP None  03/12/2024 10:30 AM Cottle, Kennedy Peabody., Megan Whitney CP-CP None  03/14/2024 10:00 AM [name], LCSW CP-CP None  03/27/2024  2:40 PM Meldon Sport, Megan Whitney RPC-RPC RPC  03/28/2024 10:00 AM [name], LCSW CP-CP None  04/08/2024 10:30 AM Cottle, Kennedy Peabody., Megan Whitney CP-CP None  04/11/2024  9:00 AM [name], LCSW CP-CP None  04/25/2024 10:00 AM [name], LCSW CP-CP None  05/08/2024 10:00 AM Cottle, Kennedy Peabody., Megan Whitney CP-CP None  05/09/2024 10:00 AM  [name], LCSW CP-CP None  06/05/2024 10:40 AM RPC-ANNUAL WELLNESS VISIT R[facility] RPC    No orders of the defined types were placed in this encounter.      -------------------------------

## 2024-02-06 NOTE — Telephone Encounter (Signed)
 Increase lamotrigine  to 2 of the 100 mg tablets at night and continue one of the 100 mg tablets in the morning for depression.

## 2024-02-07 NOTE — Therapy (Signed)
 OUTPATIENT PHYSICAL THERAPY THORACOLUMBAR TREATMENT   Patient Name: Megan Whitney MRN: [id number] DOB:November 09, 1955, 68 y.o., female Today's Date: 02/08/2024  END OF SESSION:  PT End of Session - 02/08/24 1034     Visit Number 7    Number of Visits 16    Date for PT Re-Evaluation 02/20/24    Authorization Type [employer] P[employer] MCR    PT Start Time 1017    PT Stop Time 1100    PT Time Calculation (min) 43 min    Activity Tolerance Patient tolerated treatment well    Behavior During Therapy WFL for tasks assessed/performed                    Past Medical History:  Diagnosis Date   ADD (attention deficit disorder)    Allergy    Seasonal   Anemia    History of GI blood loss   Anxiety    Arthritis    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    Esophageal stricture    Fracture of superior pubic ramus (HCC) 11/28/2018   GERD (gastroesophageal reflux disease)    Headache(784.0)    Hyperlipidemia    Interstitial cystitis    Joint pain    Lactose intolerance    Lung cancer (HCC) 2002   Neuromuscular disorder (HCC)    Obesity    Osteoarthritis    Osteoporosis    Palpitations    Sleep apnea    Doesn't use a CPAP   Suicidal ideation 01/20/2020   Swallowing difficulty    Tardive dyskinesia    Past Surgical History:  Procedure Laterality Date   BALLOON DILATION  05/16/2012   Procedure: BALLOON DILATION;  Surgeon: Claudette Cue, MD;  Location: [facility] ENDOSCOPY;  Service: Endoscopy;  Laterality: N/A;   BUNIONECTOMY  2011   COLONOSCOPY     ENTEROSCOPY  05/16/2012   Procedure: ENTEROSCOPY;  Surgeon: Claudette Cue, MD;  Location: [facility] ENDOSCOPY;  Service: Endoscopy;  Laterality: N/A;   JOINT REPLACEMENT     right shoulder durgery 25 yrs ago  1988   TOTAL HIP ARTHROPLASTY Bilateral 2006, 2008   bilateral    TUBAL LIGATION  1990   WEDGE RESECTION  2002   lung cancer   Patient Active Problem List   Diagnosis Date Noted   Neuromuscular disorder (HCC)    Attention deficit disorder 09/27/2023   Prediabetes 09/27/2023   DDD (degenerative disc disease), lumbar 03/27/2023   Class 1 obesity due to excess calories without serious comorbidity in adult 11/29/2022   Chronic thoracic back pain 09/22/2022   Neuroleptic-induced tardive dyskinesia 07/20/2022   Intertrigo 05/19/2022   Encounter for general adult medical examination with abnormal findings 03/22/2022   Arthritis of knee 08/17/2021   Polyp of colon 02/04/2021   Seasonal allergies 10/07/2020   Osteopenia 10/07/2020   Gait disturbance 10/07/2020   Hyperlipidemia 06/11/2020   Sleep apnea 06/11/2020   Stricture and stenosis of esophagus 05/16/2012   Hiatal hernia 05/16/2012   Dysphagia 05/15/2012   Depression    Bipolar disorder (HCC)    GERD (gastroesophageal reflux disease) 09/14/2010   History of colonic polyps 09/14/2010    PCP: Meldon Sport, MD   REFERRING PROVIDER: Mort Ards, MD   REFERRING DIAG:   Scoliosis  deformity of spine  Thoracic back pain  Thoracic spondylosis  Rationale for Evaluation and Treatment: Rehabilitation  THERAPY DIAG:  Chronic bilateral thoracic back pain  Abnormal posture  Muscle weakness (generalized)  ONSET DATE: Winter 2024 pain  SUBJECTIVE:                                                                                                                                                                                           SUBJECTIVE STATEMENT: I am doing better today and have 0/10 pain and am looking forward to learning how to lift safely in my gym     EVAL- I have scoliosis and I need strengthening in my core.  I  thought I was only going to do aquatics. I presently do not have pain but I have had pain especially early last year.  I am worried about the sharp pains that I have  in my back and when I vaccuum and mop. I can stand < 5 minutes especially later in the day when I am tired. No issues with sleeping. Anything later in the afternoon I have pain sitting/driving, standing longer than 5 minutes  I can walk for 10 minutes and then pain.Pain limits me from socializing and my husband currently has terminal cancer. I am shaky from medication, Pt does not report doing any consistent exercise.  Pt has been increasing pain since last year.    PERTINENT HISTORY:  Hx of bipolar disorder, anxiety,ADD, Osteopenia, OSA, pelvic fx 2020, tripping, GERD, tardive dyskinesia, Bil THR  PAIN:  Are you having pain? Yes: NPRS scale: at rest 0/10 at worst 9/10 and later in day Pain location: thoracic and low back and especially on Right Pain description: aching Aggravating factors: standing later in day. Household chores. Bending over to get laundry, mopping and vacuuming outside changing the bird feeder with 5lb -10 lb bag Relieving factors: heating pad, medication once in a while   PRECAUTIONS: None  RED FLAGS: None   WEIGHT BEARING RESTRICTIONS: No  FALLS:  Has patient fallen in last 6 months? No  LIVING ENVIRONMENT: Lives with: lives with their spouse Lives in: House/apartment Stairs: steps outside and a ramp Has following equipment at home: None  OCCUPATION: retired  PLOF: Independent  PATIENT GOALS: Pt would like to be able to be stronger  NEXT MD VISIT: TBD  OBJECTIVE:  Note: Objective measures were completed at Evaluation unless otherwise noted.  DIAGNOSTIC FINDINGS:  See medical record Lumbar radiographs from 2024 shows mild curvature of the spine, mild multilevel degenerative changes and grade 1 anterolisthesis of L4 on L5  PATIENT SURVEYS:  Modified Oswestry 52  02-01-24 16%  COGNITION: Overall cognitive status: Within functional limits for tasks assessed     SENSATION: WFL has tardive dyskinesia  MUSCLE LENGTH: Hamstrings: Right bil  tightness   POSTURE: rounded shoulders, decreased lumbar lordosis, increased thoracic kyphosis, weight shift left, and scoliotic curve C curve ot left Left convex side  PALPATION: TTP over thoracic right spine  LUMBAR ROM:   *did not test forward flexion due to osteopenia /self report of osteoporosis  AROM eval 02-01-24  Flexion  flexed at 20 degrees in stance Fingertips to ankle  Extension  Only to neutral 10 degrees " feels good  Right lateral flexion 50% 75%  Left lateral flexion 75% limited * 75%  Right rotation    Left rotation    Key: [facility] = within functional limits not formally assessed, * = concordant pain, s = stiffness/stretching sensation, NT = not tested)     LOWER EXTREMITY ROM:    corrected 02-08-24  Active  Right eval Left eval R/L 02-01-24  Hip flexion 60 60 90/90  Hip extension     Hip abduction     Hip adduction     Hip internal rotation     Hip external rotation     Knee flexion     Knee extension     Ankle dorsiflexion     Ankle plantarflexion     Ankle inversion     Ankle eversion     Key: WFL = within functional limits not formally assessed, * = concordant pain, s = stiffness/stretching sensation, NT = not tested)     LOWER EXTREMITY MMT:   mostly WFL for tasks assessed  MMT Right eval Left eval  Hip flexion 5 4  Hip extension 4- 4-  Hip abduction 4- 4-  Hip adduction    Hip internal rotation    Hip external rotation    Knee flexion 4 5  Knee extension 5 5  Ankle dorsiflexion 4 4  Ankle plantarflexion    Ankle inversion    Ankle eversion    (Blank rows = not tested, score listed is out of 5 possible points.  N = WNL, D = diminished, C = clear for gross weakness with myotome testing, * = concordant pain with testing)   LUMBAR SPECIAL TESTS:  Straight leg raise test: Negative and Slump test: Negative  FUNCTIONAL TESTS:  5 times sit to stand: 19.47 2 minute walk test: 351 ft   Single leg stance  LT 2 sec  RT 0 sec   01-04-24   1154 ft (1500-1905 ft) 01-16-24  5 X STS 11.67 sec GAIT: Distance walked: 351 ft Assistive device utilized: None Level of assistance: Complete Independence Comments: Pt leans to left while walking,     [facility] Adult PT Treatment:                                                DATE: 02-08-24 1 more visit left  lifting Therapeutic Exercise: Galileo leg press 1 plate x 10 bil and then 2 plates 2 x 5 reps bil Cybex hip abduction 12.5 lb 2 x 10 on R and L  Cybex hip extension 12.5 lb 2 x 10 on R and L  Prone plank on elbows  28 sec VC and TC Bear crawl Q ped plank 20 sec, 25 sec, 30 sec  VC and  TC Wall push up plus 2  x 8 Reverse lunge 1 x 5 on R and L  Therapeutic Activity: Step ups on 8 inch steps.  1 x 10 on R and 1 x 10 on L STS 15 #  3 x 8  [facility] Adult PT Treatment:                                                DATE: 02/05/24  Heel raise with stomp 10 x 2 - pt needs hand out for this- lost it Wall lift off 2 x 8  Wall slide 10 x 10 sec hold  Bridge with ball squeeze x 10 90/90 ab hold 10 sec x 3  SKTC x 5  LTR x 10 Min cues for below:  Hip hinge with KB handle touch x 5 at 6 inch, 4 inch and 0 inches from floor KB lift x 8 from 6 inch , 4 inch and 0 inches from floor x 8 at each level - with extension between sets  Updated HEP into sections    [facility] Adult PT Treatment:                                                DATE: 02-01-24 ODI 16 % Therapeutic Exercise: Wall lift off 2 x 8 Bridge with ball 2 x 10 Heel raise with stomp 2 x 10 Wall slide 10 x 10 sec hold  Therapeutic Activity: Lifting principles with demo lifting on  elevated 6 inch step Hip hinge practice Hip hinge with KB handle touch x 10 Deadlift from elevated 6 inch step backwards Deadlift from elevated 6 inch step forwards 3x8  with extension between sets  [facility] Adult PT Treatment:                                                DATE: 01-30-24 Therapeutic Exercise: Osteoporosis  postural  correction with 1)  lying in decompression Exercise for 5 minutes with deep breathing 2) shoulder press hold 2-3 sec and repeat 5 x 3) Head press hold 2-3 seconds repeat 5 x 4) leg press 2-3 seconds 6 x each leg 5) leg lengthening 2-3 seconds 6 x each leg  Therapeutic Activity: Hip hinge with dowel rod on 2 points due to scoliosis mid back and sacral area Sit to stand x 10  Self Care: Osteoporosis education given 2/3 of handout for education about avoiding sitting and twisting and standing and extension and twisting Simulating gardening scenarios and problem solving with osteoporosis precautions Posture and body mechanic education   [facility] Adult PT Treatment:                                                DATE: 01-16-24 Standing exercises and deadlift safety Therapeutic Exercise: Chin tuck exercise 10 x 10 sec 90 90 abdominal setting.  Physioball bridge Hip hinge with dowel Therapeutic Activity: 5 x STS  11.97 sec STS with !5 #  KB  3 x 10 Proper lifting procedure for weight Deadlift backwards on elevated 8 inch box x 6  Deadlift forward on elevated  8 in box x 6  Self Care: Education on posture and body mechanics  Education on using body mechanics around household chores with handouts [facility] Adult PT Treatment:                                                DATE: 01-11-24 Therapeutic Exercise: Supine pelvic tilt 20 x with VC and TC for correct execution STKC on Right and Left 5 x for 10 sec each Bridge with ball 2 x 10 LTR Star pattern Green T band 3 x 10 with chin tuck Chin tuck x 10 for 10 sec each Child's pose stretch forward , and to sides added to HEP  Therapeutic Activity: STS with 5 lb DB 2  x 10 Reverse lunge x 5 each side  Self Care: DOMs education and progressive strengthening and need for good nutrition and hydration  [facility] Adult PT Treatment:                                                DATE: 01-04-24 Therapeutic Exercise: Supine pelvic tilt 20 x with VC and TC for correct  execution STKC on Right and Left 5 x for 10 sec each LTR Bridge with ball 2 x 10 Star pattern Red T band 3 x 10 with chin tuck Chin tuck x 10 for 10 sec each   Therapeutic Activity: STS with 5 lb DB 3 x 10  1154 ft (1500-1905 ft)  Self Care: Walking program education   TREATMENT DATE: eval and issue HEP  Aquatic exercise therapy information                                                                                                                                 PATIENT EDUCATION:  Education details: POC Explanation of findings HEP issue Aquatic therapy information Person educated: Patient Education method: Explanation, Demonstration, Tactile cues, Verbal cues, and Handouts Education comprehension: verbalized understanding, returned demonstration, verbal cues required, tactile cues required, and needs further education  HOME EXERCISE PROGRAM: Access Code: [email or web address] updated 02-01-24 URL: [email or web address] Date: 02/01/2024 Prepared by: Sharlet Dawson  Exercises - Supine Pelvic Tilt  - 1 x daily - 7 x weekly - 3 sets - 10 reps - Supine Single Knee to Chest Stretch  - 2 x daily - 7 x weekly - 1 sets - 5 reps - 10 hold - Supine Lower Trunk Rotation  - 1 x daily - 7 x weekly - 3 sets - 10 reps - Supine Bridge with Mini  Swiss Ball Between Knees  - 1 x daily - 7 x weekly - 3 sets - 10 reps - Sit to stand with sink support Movement snack  - 1 x daily - 7 x weekly - 3 sets - 10 reps - Supine Cervical Retraction with Towel  - 1 x daily - 7 x weekly - 1 sets - 10 reps - 10 sec hold - Standing Shoulder Diagonal Horizontal Abduction 60/120 Degrees with Resistance  - 1 x daily - 7 x weekly - 3 sets - 10 reps - Standing Shoulder Horizontal Abduction with Resistance  - 1 x daily - 7 x weekly - 3 sets - 10 reps - childs pose stretch forward and to side  - 1 x daily - 7 x weekly - 1 sets - 6 reps - 15-20 sec hold - Supine90/90 abdominal Bracing resting on couch  - 1 x  daily - 7 x weekly - 1 sets - 5 reps - 10-15 hold - Standing Hip Hinge with Dowel  - 1 x daily - 7 x weekly - 3 sets - 10 reps - Deadlift on elevated 8 inch box with 10 lb  - 1 x daily - 3 x weekly - 1 sets - 6 reps - Wall Slide with Posterior Pelvic Tilt  - 1 x daily - 7 x weekly - 1 sets - 10 reps - 10 sec hold - Heel Raises with Counter Support  - 1 x daily - 7 x weekly - 3 sets - 10 reps Added 02-08-24 - Bear Plank from Quadruped  - 1 x daily - 7 x weekly - 3 sets - 10 reps - Wall Push Up with Plus  - 1 x daily - 7 x weekly - 2 sets - 8 reps ASSESSMENT:  CLINICAL IMPRESSION: Pt returns to clinic with 0/10 pain.  Pt has mentioned feeling stronger and has interest in learning how to safely use gym equipment. Reinforcing HEP for home use  and will attend one more appt before DC to complete PT goals.       EVAL-Patient is a 68 y.o. female who was seen today for physical therapy evaluation and treatment for thoracic and lumbar pain due to scoliosis and sedentary lifestyle. Pt does not report any routine exercise and does not walk more than 5 minutes. Pt is caring for terminally ill husband and needs to improve strength, and pain control in order to perform household duties and caring for husband. Pt will also benefit from aquatic therapy and skilled PT to improve impairments   OBJECTIVE IMPAIRMENTS: decreased activity tolerance, decreased mobility, difficulty walking, decreased ROM, decreased strength, impaired flexibility, improper body mechanics, postural dysfunction, obesity, pain, and scoliosis C curve .   ACTIVITY LIMITATIONS: carrying, lifting, bending, sitting, standing, squatting, stairs, locomotion level, and caring for others  PARTICIPATION LIMITATIONS: meal prep, cleaning, laundry, driving, and community activity  PERSONAL FACTORS: Hx of bipolar disorder, anxiety,ADD, Osteopenia, OSA, pelvic fx 2020, tripping, GERD, tardive dyskinesia, Bil THR are also affecting patient's functional  outcome.   REHAB POTENTIAL: Good  CLINICAL DECISION MAKING: Evolving/moderate complexity  EVALUATION COMPLEXITY: Moderate   GOALS: Goals reviewed with patient? Yes  SHORT TERM GOALS: Target date: 01-23-24  Pt will demonstrate appropriate understanding and performance of initially prescribed HEP in order to facilitate improved independence with management of symptoms Baseline:no knowledge 01-16-24  Education on posture and body mechanics 01-30-24  Osteoporosis education Goal status MET 01-30-24  2.  Pt will perform 5xSTS in <13.0 sec in  order to demonstrate reduced fall risk and improved functional independence. (MCID of 2.3sec) Baseline: 19.47 sec 01-16-24 11:97 sec Goal status: MET  3.  Demonstrate understanding of elongation of spine in sitting posture and be more conscious of position and posture throughout the day.  Baseline: Left C Curve scoliorsis Goal status: MET 01-28-24  4.  Begin Aquatic therapy for management of movement and pain control Baseline: No knowledge Goal status:Deferred   LONG TERM GOALS: Target date: 02-20-24  Pt will be I with advanced HEP Baseline: no knowledge Goal status:ONGOING  2.  Demonstrate and verbalize techniques to reduce the risk of re-injury including: lifting, posture, body mechanics mindful of scoliosis/  Baseline:  02-01-24  completed education on scoliosis and precautians Goal status:ONGOING  3.  Jalayah will show a >/= 12 pt improvement in their ODI score (MCID is 12 pts) as a proxy for functional improvement Baseline: 52 % 02-01-24  16% Goal status: MET  4.  Pt will demonstrate increase lumbar and thoracic strength by being able to carry 5- 10 lb bird seed bag without exacerbating thoracic /lumbar pain greater than 3/10 Baseline: at worst 9/10 02-01-24  learning deadlift on elevated step 6 inches Goal status:ONGOING  5.  Pt will be able to perform 6 MWT within normal limits for age 30 ft to 1905 ft Baseline: 351 ft Goal  status: INITIAL  6.  Pt will improve endurance by walking at least 20 minutes 3 times a week Baseline: No current routine exercise. Does not walk greater than 5 min Goal status: INITIAL  PLAN:  PT FREQUENCY: 1-2x/week  PT DURATION: 8 weeks  PLANNED INTERVENTIONS: 97164- PT Re-evaluation, 97750- Physical Performance Testing, 97110-Therapeutic exercises, 97530- Therapeutic activity, [id number]- Neuromuscular re-education, 97535- Self Care, 19147- Manual therapy, [phone]- Gait training, [phone]- Aquatic Therapy, [phone]- Electrical stimulation (manual), Patient/Family education, Balance training, Stair training, Taping, Dry Needling, Joint mobilization, Spinal mobilization, Cryotherapy, and Moist heat.  PLAN FOR NEXT SESSION: issue HEP, TPDN as needed for thoracic pain   Sharlet Dawson, PT, ATRIC Certified Exercise Expert for the Aging Adult  02/08/24 1:57 PM Phone: [phone] Fax: [phone]

## 2024-02-08 ENCOUNTER — Ambulatory Visit: Admitting: Physical Therapy

## 2024-02-08 ENCOUNTER — Encounter: Payer: Self-pay | Admitting: Physical Therapy

## 2024-02-08 DIAGNOSIS — M546 Pain in thoracic spine: Secondary | ICD-10-CM | POA: Diagnosis not present

## 2024-02-08 DIAGNOSIS — M6281 Muscle weakness (generalized): Secondary | ICD-10-CM

## 2024-02-08 DIAGNOSIS — G8929 Other chronic pain: Secondary | ICD-10-CM

## 2024-02-08 DIAGNOSIS — R293 Abnormal posture: Secondary | ICD-10-CM

## 2024-02-13 ENCOUNTER — Ambulatory Visit: Admitting: Psychiatry

## 2024-02-14 ENCOUNTER — Ambulatory Visit: Admitting: Psychiatry

## 2024-02-14 DIAGNOSIS — F411 Generalized anxiety disorder: Secondary | ICD-10-CM

## 2024-02-14 NOTE — Progress Notes (Signed)
 [facility] Counselor/Therapist Progress Note  Patient ID: Megan Whitney, MRN: [id number],    Date: 02/14/2024  Time Spent: 53 minutes   Treatment Type: Individual Therapy  Reported Symptoms: anxiety, some depression, bipolar, denies any thoughts to harm self nor others   Mental Status Exam:  Appearance:   Casual and Neat     Behavior:  Appropriate, Sharing, and Motivated  Motor:  Sometimes easy for her to trip up when walking  Speech/Language:   Clear and Coherent  Affect:  Depressed and anxious  Mood:  anxious and depressed  Thought process:  goal directed  Thought content:    Rumination  Sensory/Perceptual disturbances:    WNL  Orientation:  oriented to person, place, time/date, situation, day of week, month of year, year, and stated date of February 14, 2024  Attention:  Fair  Concentration:  Good and Fair  Memory:  Sometimes having some short term memory issues  Fund of knowledge:   Good  Insight:    Fair  Judgment:   Good  Impulse Control:  Good and Fair   Risk Assessment: Danger to Self:  No Self-injurious Behavior: No Danger to Others: No Duty to Warn:no Physical Aggression / Violence:No  Access to Firearms a concern: No  Gang Involvement:No   Subjective:  Patient focusing well in session today stating she is wanting to work on her anxiety, irritability, and her feelings of overwhelmedness. States the causes of these 3 things are: the results of husband's biopsy which they are expecting to get next week, finances, and follow through on some conversations between she and husband. Today reports feeling more anxious and depressed but also aware of her strengths that have grown over time and especially more recently. Still has sadness at times but also more emotionally balanced at times, and patient senses this also.  Feeling good about some of her strength increasing, especially in how she manages challenges and some anticipatory grief regarding her husband.  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 Does recognize some improvement in her coping, and particularly in her self-esteem which we discussed and encouraged her further today.  She is focusing more on being in contact with friends, healthy eating, walking, and trying not to focused just on the negatives, which is helpful to her.  Also decreasing her own negative self talk.  Continues work on her treatment goals, taking her meds as prescribed, practicing having healthier boundaries with others as needed, and taking time for herself.  Continues to stay in contact with friends frequently.  Interventions: Cognitive Behavioral Therapy, Solution-Oriented/Positive Psychology, and Ego-Supportive Long Term Goal: Reduce overall level, frequency, and intensity of the anxiety so that daily functioning is not impaired. Short Term Goal: 1.Increase understanding of the beliefs and messages that produce the worry and anxiety. Strategies: 1.Help client develop reality-based positive cognitive messages/self-talk. 2. Develop a coping card or other reminder which coping strategies are recorded for patient's later use.   Diagnosis:   ICD-10-CM   1. Generalized anxiety disorder  F41.1      Plan: Patient today showing increased motivation and strength working further on her grief about the decline of her husband and being able to express some frustrations when he goes against what has been medically advised.  Had felt guilty about expressing some of these frustrations but did well in session today talking through her understandable frustrations and at times anger, as she has  been trying to help support husband anyway she can.  Showing some improved self-care and self love especially in the midst of difficult situation at home with her husband and health concerns.  Valuing her own self-care in taking care of her health.  Encouraging patient to continue the  positive steps in her own self-care as she also supports her husband as she is able and as he allows.  States today that she does see some progress within herself in terms of boundary setting and being able to love herself especially in tough times.  Good strength in continuing working on her therapy goals which were reviewed at end of session.  Continue to encourage patient and working with treatment strategies including letting others be supportive of her, use of good judgment and making thought-through decisions rather than impulsive decisions, refrain from self sabotage, refrain from assuming worst-case scenarios, seeing her strengths and the positives within herself, engage frequently with her 2 dogs which are very therapeutic for her, and realize the strength she shows working with goal-directed behaviors to move in a direction that supports and helps improve her overall physical and emotional health as well as her outlook in trying to maintain a sense of hopefulness during this difficult time in her life.  Delecia Vastine is making progress and needs to continue working with goal-directed behaviors to build on the progress she has already made and keep moving forward in a more hopeful and healthier direction into the future.  Goal review and progress/challenges noted with patient.  Next appointment within 2 to 3 weeks.   [name], LCSW

## 2024-02-15 ENCOUNTER — Telehealth: Payer: Self-pay

## 2024-02-15 DIAGNOSIS — F3132 Bipolar disorder, current episode depressed, moderate: Secondary | ICD-10-CM

## 2024-02-18 ENCOUNTER — Other Ambulatory Visit: Payer: Self-pay

## 2024-02-18 MED ORDER — AUSTEDO XR 48 MG PO TB24: 1.0000 | ORAL_TABLET | Freq: Every day | ORAL | 2 refills | Status: DC

## 2024-02-18 MED ORDER — LAMOTRIGINE 100 MG PO TABS: ORAL_TABLET | ORAL | 2 refills | Status: DC

## 2024-02-18 NOTE — Telephone Encounter (Signed)
 Pt called to report she stopped focalin  and some symptoms as teeth clencing and grinding of teeth were minimized but not everything as of this past Friday she is taking her Austedo  XR 48 mg. Pt reports on Saturday she restarted her focalin  and having no problems and her Austedo  is really working. She is very happy things are now improving.  Rx for Austedo  XR 48 mg sent to [facility] Rx of Lamotrigine  100 mg in am and 200 mg pm, dose increase sent to [facility] and will contact pharmacy if they already have a Rx for Focalin  10 mg on file.

## 2024-02-19 ENCOUNTER — Ambulatory Visit: Admitting: Physical Therapy

## 2024-02-19 ENCOUNTER — Other Ambulatory Visit: Payer: Self-pay

## 2024-02-19 DIAGNOSIS — F3132 Bipolar disorder, current episode depressed, moderate: Secondary | ICD-10-CM

## 2024-02-19 MED ORDER — CLOZAPINE 25 MG PO TABS: 75.0000 mg | ORAL_TABLET | Freq: Every day | ORAL | 2 refills | Status: DC

## 2024-02-27 ENCOUNTER — Ambulatory Visit: Admitting: Psychiatry

## 2024-02-27 DIAGNOSIS — F3132 Bipolar disorder, current episode depressed, moderate: Secondary | ICD-10-CM | POA: Diagnosis not present

## 2024-02-27 NOTE — Progress Notes (Signed)
 [facility] Counselor/Therapist Progress Note  Patient ID: JESSIAH WOJNAR, MRN: [id number],    Date: 02/27/2024  Time Spent: 52 minutes   Treatment Type: Individual Therapy  Reported Symptoms: anxiety, some depression, bipolar, denies any thoughts to harm herself nor others   Mental Status Exam:  Appearance:   Casual     Behavior:  Appropriate, Sharing, and difficulty motivating   Motor:  Some shuffling and trying to be more mindful  Speech/Language:   Clear and Coherent  Affect:  Some anxiety and depression  Mood:  anxious and depressed  Thought process:  goal directed  Thought content:    Rumination  Sensory/Perceptual disturbances:    WNL  Orientation:  oriented to person, place, time/date, situation, day of week, month of year, year, and stated date of February 27, 2024  Attention:  Good  Concentration:  Good and Fair  Memory:  Some occasional forgetting  Fund of knowledge:   Good  Insight:    Fair  Judgment:   Fair  Impulse Control:  Fair and Poor   Risk Assessment: Danger to Self:  No Self-injurious Behavior: No Danger to Others: No Duty to Warn:no Physical Aggression / Violence:No  Access to Firearms a concern: No  Gang Involvement:No   Subjective: Patient today processing further her anxiety, anger, depression, bipolar, irritability, feelings of overwhelmedness, mostly due to issues between her and husband, husband's cancer, financial stress, and some issues patient  and husband as they process decisions about husband's cancer treatment.  Anxiety and my bipolar have been stronger recently at times due to some confrontations at home with spouse. Hurt by husband's comments at times, and takes breaks. Also finds reading helpful. Does see some improvement in her coping and specific ways of responding to husband when he is hurtful and minimizing.  Talked through several specific situations that tend to occur between her and husband and encouraged patient to  respond in different ways with him, or in some cases no response may be better as that seems to aggravate husband more.  States I am aware of some of my progress except when I feel I say or do the wrong thing at home, which is upsetting for patient and we took some time to process this further, supporting her in her improved strength and also looking at some ways of better managing some of the communication with her husband.  Does acknowledge some of the increased strength she is showing.  Also notices when her self-esteem is better at times.  Trying to follow through on improved self talk along with her other treatment goals while staying on her medication as prescribed, using healthier boundaries, and looking at herself and more positive ways.  Does have frequent contact with good supportive friends.  Interventions: Cognitive Behavioral Therapy and Ego-Supportive Long Term Goal: Reduce overall level, frequency, and intensity of the anxiety so that daily functioning is not impaired. Short Term Goal: 1.Increase understanding of the beliefs and messages that produce the worry and anxiety. Strategies: 1.Help client develop reality-based positive cognitive messages/self-talk. 2. Develop a coping card or other reminder which coping strategies are recorded for patient's later use.    Diagnosis:   ICD-10-CM   1. Bipolar disorder with moderate depression (HCC)  F31.32      Plan:   Patient in for session today showing good effort and working further on her anxiety, bipolar, some depression, anger, feelings of overwhelmingness, and trying to take better care of herself.  Showing good  effort today and is doing really good at staying in contact with supportive friends.  Less guilt.  Trying to better manage anger.  Continue to encourage patient in some positive steps she has been taking with her own self-care, while also helping in the care of her husband diagnosed with cancer.  Also focusing on her  boundary setting as well as loving and caring for herself particularly in stressful times with husband and some missed treatment of patient.  Continues to work on therapy goals which we reviewed before patient left today. Encouraged patient and her work with treatment strategies and letting others be supportive of her, use of good judgment, making thought through decisions rather than impulsive decisions, refrain from self sabotage, refrain from assuming worst-case scenarios, seeing her strengths and the positives within herself, engage frequently with her 2 dogs which are very therapeutic for her, and recognize the strengths she shows working with goal-directed behaviors moving in a direction that supports and helps her improve her overall physical and emotional health as well as her outlook into the future and trying to hold onto more hope during this ongoing difficult time in her life.  Terralyn Matsumura has made progress and she needs to continue working with goal-directed behaviors building on the progress she has already made and continue moving forward in a more hopeful and healthier direction into the future.  Goal review and progress/challenges noted with patient.  Next appointment within 2 to 3 weeks.  Barnie Bunde, LCSW

## 2024-02-29 DIAGNOSIS — F319 Bipolar disorder, unspecified: Secondary | ICD-10-CM | POA: Diagnosis not present

## 2024-02-29 DIAGNOSIS — Z79899 Other long term (current) drug therapy: Secondary | ICD-10-CM | POA: Diagnosis not present

## 2024-03-01 ENCOUNTER — Encounter: Payer: Self-pay | Admitting: Psychiatry

## 2024-03-05 ENCOUNTER — Ambulatory Visit: Attending: Orthopedic Surgery | Admitting: Physical Therapy

## 2024-03-05 ENCOUNTER — Encounter: Payer: Self-pay | Admitting: Physical Therapy

## 2024-03-05 DIAGNOSIS — G8929 Other chronic pain: Secondary | ICD-10-CM | POA: Insufficient documentation

## 2024-03-05 DIAGNOSIS — M6281 Muscle weakness (generalized): Secondary | ICD-10-CM | POA: Diagnosis not present

## 2024-03-05 DIAGNOSIS — M546 Pain in thoracic spine: Secondary | ICD-10-CM | POA: Diagnosis not present

## 2024-03-05 DIAGNOSIS — R293 Abnormal posture: Secondary | ICD-10-CM | POA: Insufficient documentation

## 2024-03-05 DIAGNOSIS — M545 Low back pain, unspecified: Secondary | ICD-10-CM | POA: Diagnosis not present

## 2024-03-05 NOTE — Therapy (Addendum)
 OUTPATIENT PHYSICAL THERAPY THORACOLUMBAR TREATMENT/ERO/RECertification/DISCHARGE NOTE PHYSICAL THERAPY DISCHARGE SUMMARY  Visits from Start of Care: 8  Current functional level related to goals / functional outcomes: Last known as in objective information below   Remaining deficits: Unknown,  does have osteoporosis   Education / Equipment: HEP, Osteoporosis Education, Posture and body mechanics   Patient agrees to discharge. Patient goals were partially met. Patient is being discharged due to the patient's request. Pt was making good steady progress but she needs to DC due to tooth extraction emergency and now her husband had been hospitalized and she needs to care for family in crisis and cannot attend PT although she is appreciative of PT she has received.     Patient Name: Megan Whitney MRN: [id number] DOB:1955/09/11, 68 y.o., female Today's Date: 03/05/2024  END OF SESSION:  PT End of Session - 03/05/24 0933     Visit Number 8    Number of Visits 14    Date for PT Re-Evaluation 04/16/24    Authorization Type [employer] P[employer] MCR    PT Start Time 0933    PT Stop Time 1015    PT Time Calculation (min) 42 min    Activity Tolerance Patient tolerated treatment well    Behavior During Therapy WFL for tasks assessed/performed                  Past Medical History:  Diagnosis Date   ADD (attention deficit disorder)    Allergy    Seasonal   Anemia    History of GI blood loss   Anxiety    Arthritis    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    Esophageal stricture    Fracture of superior pubic ramus (HCC) 11/28/2018   GERD (gastroesophageal reflux disease)    Headache(784.0)    Hyperlipidemia    Interstitial cystitis    Joint pain    Lactose intolerance    Lung cancer (HCC) 2002   Neuromuscular  disorder (HCC)    Obesity    Osteoarthritis    Osteoporosis    Palpitations    Sleep apnea    Doesn't use a CPAP   Suicidal ideation 01/20/2020   Swallowing difficulty    Tardive dyskinesia    Past Surgical History:  Procedure Laterality Date   BALLOON DILATION  05/16/2012   Procedure: BALLOON DILATION;  Surgeon: Lamar JONETTA Aho, MD;  Location: [facility] ENDOSCOPY;  Service: Endoscopy;  Laterality: N/A;   BUNIONECTOMY  2011   COLONOSCOPY     ENTEROSCOPY  05/16/2012   Procedure: ENTEROSCOPY;  Surgeon: Lamar JONETTA Aho, MD;  Location[facility] ENDOSCOPY;  Service: Endoscopy;  Laterality: N/A;   JOINT REPLACEMENT     right shoulder durgery 25 yrs ago  1988   TOTAL HIP ARTHROPLASTY Bilateral 2006, 2008   bilateral   TUBAL LIGATION  1990   WEDGE RESECTION  2002   lung cancer   Patient Active Problem List   Diagnosis Date Noted   Neuromuscular disorder [facility])    Attention deficit disorder 09/27/2023   Prediabetes 09/27/2023   DDD (degenerative disc disease), lumbar 03/27/2023   Class 1 obesity due to excess calories without serious comorbidity in adult 11/29/2022   Chronic thoracic back pain 09/22/2022   Neuroleptic-induced tardive dyskinesia  07/20/2022   Intertrigo 05/19/2022   Encounter for general adult medical examination with abnormal findings 03/22/2022   Arthritis of knee 08/17/2021   Polyp of colon 02/04/2021   Seasonal allergies 10/07/2020   Osteopenia 10/07/2020   Gait disturbance 10/07/2020   Hyperlipidemia 06/11/2020   Sleep apnea 06/11/2020   Stricture and stenosis of esophagus 05/16/2012   Hiatal hernia 05/16/2012   Dysphagia 05/15/2012   Depression    Bipolar disorder (HCC)    GERD (gastroesophageal reflux disease) 09/14/2010   History of colonic polyps 09/14/2010    PCP: Tobie Suzzane POUR, MD   REFERRING PROVIDER: Burnetta Aures, MD   REFERRING DIAG:   Scoliosis deformity of spine  Thoracic back pain  Thoracic spondylosis  Rationale for Evaluation and  Treatment: Rehabilitation  THERAPY DIAG:  Chronic bilateral thoracic back pain  Abnormal posture  Muscle weakness (generalized)  Chronic low back pain without sciatica, unspecified back pain laterality  ONSET DATE: Winter 2024 pain  SUBJECTIVE:                                                                                                                                                                                           SUBJECTIVE STATEMENT: I have not gone to the gym and haven't been able to reinforce what I am learning. I have 0/10 presently but I am a 9/10 by end of the day.      EVAL- I have scoliosis and I need strengthening in my core.  I  thought I was only going to do aquatics. I presently do not have pain but I have had pain especially early last year.  I am worried about the sharp pains that I have in my back and when I vaccuum and mop. I can stand < 5 minutes especially later in the day when I am tired. No issues with sleeping. Anything later in the afternoon I have pain sitting/driving, standing longer than 5 minutes  I can walk for 10 minutes and then pain.Pain limits me from socializing and my husband currently has terminal cancer. I am shaky from medication, Pt does not report doing any consistent exercise.  Pt has been increasing pain since last year.    PERTINENT HISTORY:  Hx of bipolar disorder, anxiety,ADD, Osteopenia, OSA, pelvic fx 2020, tripping, GERD, tardive dyskinesia, Bil THR  PAIN:  Are you having pain? Yes: NPRS scale: at rest 0/10 at worst 9/10 and later in day Pain location: thoracic and low back and especially on Right Pain description: aching Aggravating factors: standing later in day. Household chores. Bending over to get laundry, mopping and vacuuming outside changing the bird  feeder with 5lb -10 lb bag Relieving factors: heating pad, medication once in a while   PRECAUTIONS: None  RED FLAGS: None   WEIGHT BEARING RESTRICTIONS:  No  FALLS:  Has patient fallen in last 6 months? No  LIVING ENVIRONMENT: Lives with: lives with their spouse Lives in: House/apartment Stairs: steps outside and a ramp Has following equipment at home: None  OCCUPATION: retired  PLOF: Independent  PATIENT GOALS: Pt would like to be able to be stronger  NEXT MD VISIT: TBD  OBJECTIVE:  Note: Objective measures were completed at Evaluation unless otherwise noted.  DIAGNOSTIC FINDINGS:  See medical record Lumbar radiographs from 2024 shows mild curvature of the spine, mild multilevel degenerative changes and grade 1 anterolisthesis of L4 on L5  PATIENT SURVEYS:  Modified Oswestry 52  02-01-24 16% 03-05-24 26%  COGNITION: Overall cognitive status: Within functional limits for tasks assessed     SENSATION: WFL has tardive dyskinesia  MUSCLE LENGTH: Hamstrings: Right bil tightness   POSTURE: rounded shoulders, decreased lumbar lordosis, increased thoracic kyphosis, weight shift left, and scoliotic curve C curve ot left Left convex side  PALPATION: TTP over thoracic right spine  LUMBAR ROM:   *did not test forward flexion due to osteopenia /self report of osteoporosis  AROM eval 02-01-24 03-05-24  Flexion  flexed at 20 degrees in stance Fingertips to ankle Fingertips to ankle  Extension  Only to neutral 10 degrees  feels good Pt restricted to neutral with pain in small of back  Right lateral flexion 50% 75% 50%  Left lateral flexion 75% limited * 75% 50%  Right rotation     Left rotation     Key: [facility] = within functional limits not formally assessed, * = concordant pain, s = stiffness/stretching sensation, NT = not tested)     LOWER EXTREMITY ROM:    corrected 02-08-24  Active  Right eval Left eval R/L 02-01-24 R/L 03-05-24  Hip flexion 60 60 90/90 70/70  Hip extension      Hip abduction      Hip adduction      Hip internal rotation      Hip external rotation      Knee flexion      Knee extension      Ankle  dorsiflexion      Ankle plantarflexion      Ankle inversion      Ankle eversion      Key: WFL = within functional limits not formally assessed, * = concordant pain, s = stiffness/stretching sensation, NT = not tested)     LOWER EXTREMITY MMT:   mostly WFL for tasks assessed  MMT Right eval Left eval R/L 03-05-24  Hip flexion 5 4 4+/4  Hip extension 4- 4- 4/4-  Hip abduction 4- 4- 4-/4-  Hip adduction     Hip internal rotation     Hip external rotation     Knee flexion 4 5 4/5  Knee extension 5 5 5/5  Ankle dorsiflexion 4 4 4/4  Ankle plantarflexion     Ankle inversion     Ankle eversion     (Blank rows = not tested, score listed is out of 5 possible points.  N = WNL, D = diminished, C = clear for gross weakness with myotome testing, * = concordant pain with testing)   LUMBAR SPECIAL TESTS:  Straight leg raise test: Negative and Slump test: Negative  FUNCTIONAL TESTS:  5 times sit to stand: 19.47 2 minute walk  test: 351 ft   Single leg stance  LT 2 sec  RT 0 sec   01-04-24  1154 ft (1500-1905 ft) 01-16-24  5 X STS 11.67 sec 03-05-24 5 x sts 13.47 sec GAIT: Distance walked: 351 ft Assistive device utilized: None Level of assistance: Complete Independence Comments: Pt leans to left while walking,   [facility] Adult PT Treatment:                                                DATE: 03-05-24 ERO Therapeutic Exercise: Prone plank on elbows  15 sec sec VC and TC x 2 Bear crawl Q ped plank 30 sec, 15 sec, 18 sec  VC and TC use of towel for wrist comfort Bridge with ball 3 x 10 L stretch  Therapeutic Activity: STS with 10# KB 3 x 10 with 90 sec rest 1231.4 ft (1500 ft to 1905 Ft) Standing march   Self Care: Lifting principles reinforce with questions answered  Night time routines with hot shower and stretching for better sleep  [facility] Adult PT Treatment:                                                DATE: 02-08-24  Therapeutic Exercise: Galileo leg press 1 plate x 10  bil and then 2 plates 2 x 5 reps bil Cybex hip abduction 12.5 lb 2 x 10 on R and L  Cybex hip extension 12.5 lb 2 x 10 on R and L  Prone plank on elbows  28 sec VC and TC Bear crawl Q ped plank 20 sec, 25 sec, 30 sec  VC and TC Wall push up plus 2  x 8 Reverse lunge 1 x 5 on R and L  Therapeutic Activity: Step ups on 8 inch steps.  1 x 10 on R and 1 x 10 on L STS 15 #  3 x 8  [facility] Adult PT Treatment:                                                DATE: 02/05/24  Heel raise with stomp 10 x 2 - pt needs hand out for this- lost it Wall lift off 2 x 8  Wall slide 10 x 10 sec hold  Bridge with ball squeeze x 10 90/90 ab hold 10 sec x 3  SKTC x 5  LTR x 10 Min cues for below:  Hip hinge with KB handle touch x 5 at 6 inch, 4 inch and 0 inches from floor KB lift x 8 from 6 inch , 4 inch and 0 inches from floor x 8 at each level - with extension between sets  Updated HEP into sections    [facility] Adult PT Treatment:                                                DATE: 02-01-24 ODI 16 % Therapeutic Exercise: Wall lift off  2 x 8 Bridge with ball 2 x 10 Heel raise with stomp 2 x 10 Wall slide 10 x 10 sec hold  Therapeutic Activity: Lifting principles with demo lifting on  elevated 6 inch step Hip hinge practice Hip hinge with KB handle touch x 10 Deadlift from elevated 6 inch step backwards Deadlift from elevated 6 inch step forwards 3x8  with extension between sets  [facility] Adult PT Treatment:                                                DATE: 01-30-24 Therapeutic Exercise: Osteoporosis  postural  correction with 1) lying in decompression Exercise for 5 minutes with deep breathing 2) shoulder press hold 2-3 sec and repeat 5 x 3) Head press hold 2-3 seconds repeat 5 x 4) leg press 2-3 seconds 6 x each leg 5) leg lengthening 2-3 seconds 6 x each leg  Therapeutic Activity: Hip hinge with dowel rod on 2 points due to scoliosis mid back and sacral area Sit to stand x 10  Self  Care: Osteoporosis education given 2/3 of handout for education about avoiding sitting and twisting and standing and extension and twisting Simulating gardening scenarios and problem solving with osteoporosis precautions Posture and body mechanic education   [facility] Adult PT Treatment:                                                DATE: 01-16-24 Standing exercises and deadlift safety Therapeutic Exercise: Chin tuck exercise 10 x 10 sec 90 90 abdominal setting.  Physioball bridge Hip hinge with dowel Therapeutic Activity: 5 x STS  11.97 sec STS with !5 # KB  3 x 10 Proper lifting procedure for weight Deadlift backwards on elevated 8 inch box x 6  Deadlift forward on elevated  8 in box x 6  Self Care: Education on posture and body mechanics Education on using body mechanics around household chores with handouts [facility] Adult PT Treatment:                                                DATE: 01-11-24 Therapeutic Exercise: Supine pelvic tilt 20 x with VC and TC for correct execution STKC on Right and Left 5 x for 10 sec each Bridge with ball 2 x 10 LTR Star pattern Green T band 3 x 10 with chin tuck Chin tuck x 10 for 10 sec each Child's pose stretch forward , and to sides added to HEP  Therapeutic Activity: STS with 5 lb DB 2  x 10 Reverse lunge x 5 each side  Self Care: DOMs education and progressive strengthening and need for good nutrition and hydration  [facility] Adult PT Treatment:                                                DATE: 01-04-24 Therapeutic Exercise: Supine pelvic tilt 20 x with VC and TC for correct execution STKC on Right and  Left 5 x for 10 sec each LTR Bridge with ball 2 x 10 Star pattern Red T band 3 x 10 with chin tuck Chin tuck x 10 for 10 sec each   Therapeutic Activity: STS with 5 lb DB 3 x 10  1154 ft (1500-1905 ft)  Self Care: Walking program education reinforcement  Reinforcing lifting principles  TREATMENT DATE: eval and issue HEP  Aquatic  exercise therapy information                                                                                                                                 PATIENT EDUCATION:  Education details: POC Explanation of findings HEP issue Aquatic therapy information Person educated: Patient Education method: Explanation, Demonstration, Tactile cues, Verbal cues, and Handouts Education comprehension: verbalized understanding, returned demonstration, verbal cues required, tactile cues required, and needs further education  HOME EXERCISE PROGRAM: Access Code: [email or web address] updated 02-01-24 URL: [email or web address] Date: 02/01/2024 Prepared by: Graydon Dingwall  Exercises - Supine Pelvic Tilt  - 1 x daily - 7 x weekly - 3 sets - 10 reps - Supine Single Knee to Chest Stretch  - 2 x daily - 7 x weekly - 1 sets - 5 reps - 10 hold - Supine Lower Trunk Rotation  - 1 x daily - 7 x weekly - 3 sets - 10 reps - Supine Bridge with Mini Swiss Ball Between Knees  - 1 x daily - 7 x weekly - 3 sets - 10 reps - Sit to stand with sink support Movement snack  - 1 x daily - 7 x weekly - 3 sets - 10 reps - Supine Cervical Retraction with Towel  - 1 x daily - 7 x weekly - 1 sets - 10 reps - 10 sec hold - Standing Shoulder Diagonal Horizontal Abduction 60/120 Degrees with Resistance  - 1 x daily - 7 x weekly - 3 sets - 10 reps - Standing Shoulder Horizontal Abduction with Resistance  - 1 x daily - 7 x weekly - 3 sets - 10 reps - childs pose stretch forward and to side  - 1 x daily - 7 x weekly - 1 sets - 6 reps - 15-20 sec hold - Supine90/90 abdominal Bracing resting on couch  - 1 x daily - 7 x weekly - 1 sets - 5 reps - 10-15 hold - Standing Hip Hinge with Dowel  - 1 x daily - 7 x weekly - 3 sets - 10 reps - Deadlift on elevated 8 inch box with 10 lb  - 1 x daily - 3 x weekly - 1 sets - 6 reps - Wall Slide with Posterior Pelvic Tilt  - 1 x daily - 7 x weekly - 1 sets - 10 reps - 10 sec hold - Heel Raises  with Counter Support  - 1 x daily - 7 x weekly - 3 sets - 10 reps Added 02-08-24 - Heron Duran  from [employer]  - 1 x daily - 7 x weekly - 3 sets - 10 reps - Wall Push Up with Plus  - 1 x daily - 7 x weekly - 2 sets - 8 reps ASSESSMENT:  CLINICAL IMPRESSION: Pt returns to clinic with 0/10 pain. But she does struggle with pain later in the day to 9/10 pain. Pt expresses a lack of confidence in performing exercises and home and has not been to a gym as planned.   Pt has mentioned  she  was feeling stronger  but she expresses increased weakness now. and has interest in learning how to safely use gym equipment. Reinforcing HEP for home use and will extend RX for additional 5-6 weeks to reinforce safety with HEP and gym equipment until completion of goals. Re certification for next 6 weeks for 1 x a week or completion of goals.      EVAL-Patient is a 68 y.o. female who was seen today for physical therapy evaluation and treatment for thoracic and lumbar pain due to scoliosis and sedentary lifestyle. Pt does not report any routine exercise and does not walk more than 5 minutes. Pt is caring for terminally ill husband and needs to improve strength, and pain control in order to perform household duties and caring for husband. Pt will also benefit from aquatic therapy and skilled PT to improve impairments   OBJECTIVE IMPAIRMENTS: decreased activity tolerance, decreased mobility, difficulty walking, decreased ROM, decreased strength, impaired flexibility, improper body mechanics, postural dysfunction, obesity, pain, and scoliosis C curve .   ACTIVITY LIMITATIONS: carrying, lifting, bending, sitting, standing, squatting, stairs, locomotion level, and caring for others  PARTICIPATION LIMITATIONS: meal prep, cleaning, laundry, driving, and community activity  PERSONAL FACTORS: Hx of bipolar disorder, anxiety,ADD, Osteopenia, OSA, pelvic fx 2020, tripping, GERD, tardive dyskinesia, Bil THR are also affecting  patient's functional outcome.   REHAB POTENTIAL: Good  CLINICAL DECISION MAKING: Evolving/moderate complexity  EVALUATION COMPLEXITY: Moderate   GOALS: Goals reviewed with patient? Yes  SHORT TERM GOALS: Target date: 01-23-24  Pt will demonstrate appropriate understanding and performance of initially prescribed HEP in order to facilitate improved independence with management of symptoms Baseline:no knowledge 01-16-24  Education on posture and body mechanics 01-30-24  Osteoporosis education Goal status MET 01-30-24  2.  Pt will perform 5xSTS in <13.0 sec in order to demonstrate reduced fall risk and improved functional independence. (MCID of 2.3sec) Baseline: 19.47 sec 01-16-24 11:97 sec Goal status: MET  3.  Demonstrate understanding of elongation of spine in sitting posture and be more conscious of position and posture throughout the day.  Baseline: Left C Curve scoliorsis Goal status: MET 01-28-24  4.  Begin Aquatic therapy for management of movement and pain control Baseline: No knowledge Goal status:Deferred   LONG TERM GOALS: Target date: 02-20-24   revised 04-16-24  Pt will be I with advanced HEP Baseline: no knowledge 03-05-24  using current HEP Goal status:PROGRESSING  2.  Demonstrate and verbalize techniques to reduce the risk of re-injury including: lifting, posture, body mechanics mindful of scoliosis/  Baseline:  02-01-24  completed education on scoliosis and precautians 03-05-24  continuing education for lifting heavier lifting Goal status: MET  3.  Akyia will show a >/= 12 pt improvement in their ODI score (MCID is 12 pts) as a proxy for functional improvement Baseline: 52 % 02-01-24  16% 03-05-25  26% Goal status: MET  4.  Pt will demonstrate increase lumbar and thoracic strength by being able to carry 5-  10 lb bird seed bag without exacerbating thoracic /lumbar pain greater than 3/10 Baseline: at worst 9/10 02-01-24  learning deadlift on elevated step 6  inches 03-05-24  carrying 10 lb on STS Goal status:PROGRESSING  5.  Pt will be able to perform 6 MWT within normal limits for age 77 ft to 1905 ft Baseline: 351 ft 03-05-24  1154 ft (1500-1905 ft) Goal status: ONGOING  6.  Pt will improve endurance by walking at least 20 minutes 3 times a week Baseline: No current routine exercise. Does not walk greater than 5 min 03-05-24  Pt is not walking currently Goal statusONGOING  PLAN:  PT FREQUENCY: 1-2x/week  1 x  a week  PT DURATION: 8 weeks   6 weeks  PLANNED INTERVENTIONS: 97164- PT Re-evaluation, 97750- Physical Performance Testing, 97110-Therapeutic exercises, 97530- Therapeutic activity, [id number]- Neuromuscular re-education, 97535- Self Care, [id number]- Manual therapy, [id number]- Gait training, [phone]- Aquatic Therapy, [phone]- Electrical stimulation (manual), Patient/Family education, Balance training, Stair training, Taping, Dry Needling, Joint mobilization, Spinal mobilization, Cryotherapy, and Moist heat.  PLAN FOR NEXT SESSION: issue HEP, TPDN as needed for thoracic pain  Graydon Dingwall, PT, ATRIC Certified Exercise Expert for the Aging Adult  03/05/24 11:06 AM Phone: [phone] Fax: [phone]  Graydon Dingwall, PT, ATR[facility] Certified Exercise Expert for the Aging Adult  04/10/24 2:58 PM Phone: [phone] Fax: [phone]

## 2024-03-11 ENCOUNTER — Other Ambulatory Visit: Payer: Self-pay | Admitting: Psychiatry

## 2024-03-12 ENCOUNTER — Encounter: Payer: Self-pay | Admitting: Psychiatry

## 2024-03-12 ENCOUNTER — Ambulatory Visit (INDEPENDENT_AMBULATORY_CARE_PROVIDER_SITE_OTHER): Admitting: Psychiatry

## 2024-03-12 ENCOUNTER — Telehealth: Payer: Self-pay | Admitting: Psychiatry

## 2024-03-12 DIAGNOSIS — F3132 Bipolar disorder, current episode depressed, moderate: Secondary | ICD-10-CM

## 2024-03-12 DIAGNOSIS — R7989 Other specified abnormal findings of blood chemistry: Secondary | ICD-10-CM | POA: Diagnosis not present

## 2024-03-12 DIAGNOSIS — G2401 Drug induced subacute dyskinesia: Secondary | ICD-10-CM | POA: Diagnosis not present

## 2024-03-12 DIAGNOSIS — F5105 Insomnia due to other mental disorder: Secondary | ICD-10-CM

## 2024-03-12 DIAGNOSIS — Z79899 Other long term (current) drug therapy: Secondary | ICD-10-CM

## 2024-03-12 DIAGNOSIS — G3184 Mild cognitive impairment, so stated: Secondary | ICD-10-CM

## 2024-03-12 DIAGNOSIS — F900 Attention-deficit hyperactivity disorder, predominantly inattentive type: Secondary | ICD-10-CM

## 2024-03-12 DIAGNOSIS — F411 Generalized anxiety disorder: Secondary | ICD-10-CM | POA: Diagnosis not present

## 2024-03-12 NOTE — Progress Notes (Signed)
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 48 MG TB24 Take 1 tablet by mouth daily. 30 tablet 2   ketoconazole  (NIZORAL ) 2 % cream Apply 1 Application topically daily. 60 g 0   No current facility-administered medications for this visit.    Medication Side Effects: Other: tremor and weight gain.  Dyskinesia appears better  SE bettter than they were.  Balance problems intermittently  Allergies:  Allergies  Allergen Reactions   Azithromycin Anaphylaxis   Penicillins Anaphylaxis    DID THE REACTION INVOLVE: Swelling of the face/tongue/throat, SOB, or low BP? Yes Sudden or severe  rash/hives, skin peeling, or the inside of the mouth or nose? Yes Did it require medical treatment? No When did it last happen?       If all above answers are NO, may proceed with cephalosporin use.  Patient reacts to Z pack.  HAS Taken amoxicillin  fine.   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    Esophageal stricture    Fracture of superior pubic ramus (HCC) 11/28/2018   GERD (gastroesophageal reflux disease)    Headache(784.0)    Hyperlipidemia    Interstitial cystitis    Joint pain    Lactose intolerance    Lung cancer (HCC) 2002   Neuromuscular disorder (HCC)    Obesity    Osteoarthritis    Osteoporosis    Palpitations    Sleep apnea    Doesn't use a CPAP   Suicidal ideation 01/20/2020   Swallowing difficulty    Tardive dyskinesia     Family History  Problem Relation Age of Onset   Arthritis Mother    Hearing loss Mother    Hyperlipidemia Mother    Hypertension Mother    Depression Mother    Anxiety disorder Mother    Obesity Mother    Sudden death Mother    Hypertension Father    Diabetes Mellitus II Father    Heart disease Father    Arthritis Father    Cancer Father        Brain   COPD Father    Diabetes Father    Hyperlipidemia Father    Sleep apnea Father    Early death Sister        Aneroxia/Bulimic   Depression Brother    Early death Hydrographic surveyor Accident   Stroke Maternal Grandmother    Hypertension Maternal Grandmother    Arthritis Maternal Grandfather    Heart attack Maternal Grandfather    Hearing loss Maternal Grandfather    Depression Daughter    Drug abuse  Daughter    Heart disease Daughter    Hypertension Daughter    Colon cancer Neg Hx    Esophageal cancer Neg Hx    Rectal cancer Neg Hx    Colon polyps Neg Hx    Stomach cancer Neg Hx     Social History   Socioeconomic History   Marital status: Married    Spouse name: Not on file   Number of children: 1   Years of education: 12   Highest education level: Not on file  Occupational History   Occupation: retired  Tobacco Use   Smoking status: Never   Smokeless tobacco: Never  Vaping Use   Vaping status: Never Used  Substance and Sexual Activity   Alcohol use: Not Currently  Alcohol/week: 1.0 standard drink of alcohol    Types: 1 Glasses of wine per week    Comment: 1 glass wine occassionally   Drug use: No   Sexual activity: Yes  Other Topics Concern   Not on file  Social History Narrative   Pt lives in [place] with husband Francis.  Followed by Dr. Geoffry for psychiatry and Marval Bunde for therapy.   Right handed   Drinks caffeine   One story home   Married lives with husband   retired   Chief Executive Officer Drivers of Corporate investment banker Strain: Low Risk  (06/01/2023)   Overall Financial Resource Strain (CARDIA)    Difficulty of Paying Living Expenses: Not hard at all  Food Insecurity: No Food Insecurity (06/22/2023)   Hunger Vital Sign    Worried About Running Out of Food in the Last Year: Never true    Ran Out of Food in the Last Year: Never true  Transportation Needs: No Transportation Needs (06/22/2023)   PRAPARE - Administrator, Civil Service (Medical): No    Lack of Transportation (Non-Medical): No  Physical Activity: Inactive (06/01/2023)   Exercise Vital Sign    Days of Exercise per Week: 0 days    Minutes of Exercise per Session: 0 min  Stress: Stress Concern Present (06/01/2023)   [employer] of Occupational Health - Occupational Stress Questionnaire    Feeling of Stress : Very much  Social Connections: Socially Integrated (06/01/2023)    Social Connection and Isolation Panel    Frequency of Communication with Friends and Family: More than three times a week    Frequency of Social Gatherings with Friends and Family: More than three times a week    Attends Religious Services: More than 4 times per year    Active Member of [employer] or Organizations: Yes    Attends Banker Meetings: More than 4 times per year    Marital Status: Married  Catering manager Violence: Not At Risk (06/01/2023)   Humiliation, Afraid, Rape, and Kick questionnaire    Fear of Current or Ex-Partner: No    Emotionally Abused: No    Physically Abused: No    Sexually Abused: No    Past Medical History, Surgical history, Social history, and Family history were reviewed and updated as appropriate.   Please see review of systems for further details on the patient's review from today.   Objective:   Physical Exam:  There were no vitals taken for this visit.  Physical Exam Neurological:     Mental Status: She is alert and oriented to person, place, and time.     Cranial Nerves: No dysarthria.     Gait: Gait normal.     Comments: Lip licking consistent, and some pursing unchanged Good gait  Psychiatric:        Attention and Perception: Attention and perception normal.        Mood and Affect: Mood is anxious and depressed. Affect is not tearful.        Speech: Speech normal.        Behavior: Behavior is not slowed. Behavior is cooperative.        Thought Content: Thought content normal. Thought content is not paranoid or delusional. Thought content does not include homicidal or suicidal ideation. Thought content does not include suicidal plan.        Cognition and Memory: Cognition and memory normal.        Judgment: Judgment normal.  Comments: Insight intact Ongoing residual dep, irritability, anxiety with dep  Affect stable but complains of ongoing dep and anxiety.  Highly stressed.     Lab Review:     Component Value Date/Time    NA 139 08/02/2023 1214   NA 142 03/22/2023 0916   K 4.2 08/02/2023 1214   CL 106 08/02/2023 1214   CO2 26 08/02/2023 1214   GLUCOSE 101 (H) 08/02/2023 1214   BUN 17 08/02/2023 1214   BUN 12 03/22/2023 0916   CREATININE 0.85 08/02/2023 1214   CALCIUM  9.8 08/02/2023 1214   PROT 7.2 03/22/2023 0916   ALBUMIN 4.5 03/22/2023 0916   AST 27 03/22/2023 0916   ALT 14 03/22/2023 0916   ALKPHOS 68 03/22/2023 0916   BILITOT 0.7 03/22/2023 0916   GFRNONAA >60 08/02/2023 1214   GFRAA 96 03/02/2020 1433       Component Value Date/Time   WBC 8.0 08/02/2023 1214   RBC 4.29 08/02/2023 1214   HGB 13.2 08/02/2023 1214   HGB 14.8 03/22/2023 0916   HCT 39.8 08/02/2023 1214   HCT 47.3 (H) 03/22/2023 0916   PLT 188 08/02/2023 1214   PLT 225 03/22/2023 0916   MCV 92.8 08/02/2023 1214   MCV 92 03/22/2023 0916   MCH 30.8 08/02/2023 1214   MCHC 33.2 08/02/2023 1214   RDW 14.8 08/02/2023 1214   RDW 13.8 03/22/2023 0916   LYMPHSABS 3.4 (H) 03/22/2023 0916   MONOABS 0.7 04/04/2019 1004   EOSABS 0.2 03/22/2023 0916   BASOSABS 0.0 03/22/2023 0916  Vitamin D  level 33 on 10K units daily on 12/4/`9 Increased to prescription vitamin d  50K units Monday, Wed, Friday.  Rx sent in.   Lithium  Lvl  Date Value Ref Range Status  10/21/2018 0.18 (L) 0.60 - 1.20 mmol/L Final    Comment:    Performed at [facility], [address] [place], [place] [address]     No results found for: PHENYTOIN, PHENOBARB, VALPROATE, CBMZ   .res Assessment: Plan:    Bipolar disorder with moderate depression (HCC)  Generalized anxiety disorder  Attention deficit hyperactivity disorder (ADHD), predominantly inattentive type  Tardive dyskinesia  Mild cognitive impairment  Insomnia due to mental condition  Long term current use of clozapine   Low vitamin D  level  30 min face to face time with patient.. We discussed multiple dxes and concerns.   Megan Whitney has chronic rapid cycling bipolar disorder which is  chronically unstable and has been difficult to control.  Failed 14 different mood stabilizers.  The rapid cycling is making it difficult to control frequency of depressive episodes and the anxiety as well.  We have typically had to make frequent med changes.   Disc gradual increase bc med sensitivity.  Med sensitivity to SE seems to be the biggest problem preventing better mood control SX remain TR  Disc options for better control of dep, irritability and anxiety.  Incr Viibryd  vs clozapine  If she can tolerate it the most effective option would be to increase the clozapine  which could help depression, anxiety and irritability.  But at this time she does not believe she can tolerate a higher dose of clozapine .    Ingrezza  for TD helped reduce tongue chewing but still some mouth movements at 60 mg BID  for lip licking and pursing.  Shuffling only at night now.  Drooling resolved Stopped ingrezza  DT insurance won't cover BID anymore. Austedo  XR 48 mg daily.  Still complaining of tongue and lip movements but not grinding  or biting tongue now. Disc ECT. Only FDA approved option left. She wants to defer.    Extensive discussion of clozapine  dosing recommendations but we will increase more slowly bc she is so med sensitive.Disc risk low WBC, cardiomyopathy, etc, sedation  (Started clozapine  on 12/07/21).    Check CBC with diff every 4 weeks.  Now.  Option increase clozapine  for anxiety.  Disc SR risk.   For anxiety and irritability, reactivity , mood swings continue clozapine  but switch to 75 mg HS.  She coouldn't tolerate higher dose and so mood swings are worse.  Lately dealing with dep and anxiety but better the last couple of days. Likely to work better than incr Viibryd  but consider latter.  She has a high residual anxiety.  It has been impossible to control all of her symptoms simultaneously without causing side effects. Failed various meds.  Discussed side effects of each medicine. Ok with  Increase  Focalin  IR 20 mg BID  for ADD and off label for depression . Try to spread this out if possible for mood.   Could not get Focalin  XR to improve compliance 40 mg AM Discussed potential benefits, risks, and side effects of stimulants with patient to include increased heart rate, palpitations, insomnia, increased anxiety, increased irritability, or decreased appetite.  Instructed patient to contact office if experiencing any significant tolerability issues. Consider modafinil if necessary  Would like to avoid BZ if possible.  For dep increased Lamotrigine  100 mg Am and 200 mg pm.  Failed all reasonable alternatives for anxiety.  Option Auvelity.  She is under a lot of genuine stress.  Discussed potential metabolic side effects associated with atypical antipsychotics, as well as potential risk for movement side effects. Advised pt to contact office if movement side effects occur.    Checked B12 folate bc memory complaints.  Normal B12 & folate on 05/25/22  Disc SE meds and this is heightened by the complication of necessary polypharmacy.  Counseling 20 min: on stress management, isolation, dealing with H's drinking.  And dealing with his cancer.  He has prostate CA with mets.  Continue counseling . It helps Rec exercise as it would help.  Take advantage of family support  Encourage Alanon.    Requires frequent FU DT chronic instability.  Wants to schedule monthly.  Continue Viibryd  60 mg daily  FU 4 weeks.   Please see After Visit Summary for patient specific instructions.  Lorene Macintosh, MD, DFAPA     Future Appointments  Date Time Provider Department Center  03/14/2024 10:00 AM Sherlynn Sober, LCSW [facility] None  03/21/2024 11:00 AM Konnie Graydon RAMAN, PT [facility]  03/26/2024  9:30 AM Konnie Graydon RAMAN, PT [facility]  03/27/2024  2:40 PM Tobie Suzzane POUR, MD [facility]  03/28/2024 10:00 AM Sherlynn Sober, LCSW [facility] None  04/08/2024 10:30 AM Cottle, Lorene KANDICE Raddle., MD  CP-CP None  04/10/2024  9:30 AM Konnie Graydon RAMAN, PT [facility]  04/11/2024  9:00 AM Sherlynn Sober, LCSW [facility] None  04/25/2024 10:00 AM Sherlynn Sober, LCSW [facility] None  05/08/2024 10:00 AM Cottle, Lorene KANDICE Raddle., MD CP-CP None  05/09/2024 10:00 AM Sherlynn Sober, LCSW [facility] None  05/23/2024 10:00 AM Sherlynn Sober, LCSW [facility] None  06/05/2024 10:40 AM RPC-ANNUAL WELLNESS VISIT [facility] R[facility]    No orders of the defined types were placed in this encounter.      -------------------------------

## 2024-03-12 NOTE — Telephone Encounter (Signed)
 Pt just wanted to inform Dr. Geoffry that she is taking 48 mg of Austedo .

## 2024-03-12 NOTE — Telephone Encounter (Signed)
 Pt called to confirm she is taking 48 mg of Austedo .

## 2024-03-12 NOTE — Patient Instructions (Signed)
 Double check on Austedo  dosage .  Should be 48 mg.

## 2024-03-14 ENCOUNTER — Ambulatory Visit: Admitting: Psychiatry

## 2024-03-14 DIAGNOSIS — F411 Generalized anxiety disorder: Secondary | ICD-10-CM | POA: Diagnosis not present

## 2024-03-14 NOTE — Progress Notes (Signed)
 [facility] Counselor/Therapist Progress Note  Patient ID: Megan Whitney, MRN: [id number],    Date: 03/14/2024  Time Spent: 55 minutes   Treatment Type: Individual Therapy  Reported Symptoms: anxiety, some depression,bipolar and mania, denies thoughts to harm self or others   Mental Status Exam:  Appearance:   Casual     Behavior:  Appropriate, Sharing, and Motivated  Motor:  Some tripping up when walking  Speech/Language:   Clear and Coherent  Affect:  Anxiety and some depression  Mood:  anxious, depressed, and some irritability  Thought process:  goal directed  Thought content:    Rumination  Sensory/Perceptual disturbances:    WNL  Orientation:  oriented to person, place, time/date, situation, day of week, month of year, year, and stated date of March 14, 2024  Attention:  Good  Concentration:  Good  Memory:  WNL  Fund of knowledge:   Fair  Insight:    Fair  Judgment:   Fair  Impulse Control:  Fair   Risk Assessment: Danger to Self:  No Self-injurious Behavior: No Danger to Others: No Duty to Warn:no Physical Aggression / Violence:No  Access to Firearms a concern: No  Gang Involvement:No   Subjective:    Patient working today in session on her anxiety, depression, bipolar, anger, irritability, feelings of overwhelmingness at times, all mostly due to issues between she and husband and husband's cancer.  Also managing financial stressors and some issues between patient and husband as they process decisions about husband's cancer treatment. Has difficult time with husband drinking on top of his cancer. Patient needing session to share and process further her financial stressors, other personal stressors, and coping with her home situation. Discussed strategies to help patient with her stressors, trying to be more reasonable in her expectations of herself. Still some confrontations with spouse but not physical. Easily hurt by husband's negative comments, and  when he tends to minimize patient.  Assisting patient with ways she could respond to husband, and also understand that there is a choice to not respond in case she feels at certain times no response is the better choice.  Feels some stronger at times and at other times can easily get down but reports even then, she does not have any SI.  Overall does see some improvement in her coping with her situation, reaching out to friends, and trying to be more loving towards herself versus judging.  Notices at times some improvement in her strength.  Mentions speaking to her minister which she has not wanted to do however part of the reason is that she does not really know him well and has not given him the opportunity to speak with her.  Is thinking about this more and feeling that she may reach out to him and arrange an appointment to speak with him.  Increased strength at times.  Trying to have better self talk, stay on her medications, exercise healthier boundaries, and view herself and more positive ways.  Fortunately she has a few good supportive friends that is helping.   Interventions: Cognitive Behavioral Therapy, Solution-Oriented/Positive Psychology, and Ego-Supportive Long Term Goal: Reduce overall level, frequency, and intensity of the anxiety so that daily functioning is not impaired. Short Term Goal: 1.Increase understanding of the beliefs and messages that produce the worry and anxiety. Strategies: 1.Help client develop reality-based positive cognitive messages/self-talk. 2. Develop a coping card or other reminder which coping strategies are recorded for patient's later use   Diagnosis:  ICD-10-CM   1. Generalized anxiety disorder  F41.1      Plan:   Patient today in session working further on her anxiety, depression, bipolar, anger, feelings of overwhelmingness and wanting to take better care of herself.  Really good effort in session today and less guilt.  Working more on better  management of her anger and frustration, including taking a timeout as needed occasionally for herself.  Trying to stick with setting her boundaries more effectively and give husband space when he is more stressed out and makes it difficult for patient to be around him.  Review of therapy goals before end of session and patient remains committed to working on them and acknowledges some of her progress. Reminded and encouraged patient in her work with treatment strategies and letting others be supportive of her, use of good judgment, making thought through decisions rather than impulsive decisions, refraining from self sabotage, refraining from assuming worst-case scenarios, seeing her strengths and the positives within herself, engaging frequently with her 2 dogs which are very therapeutic for her, and recognizing the strengths she can show when working with goal-directed behaviors to move in a direction that supports and helps her improve her overall physical and emotional health in addition to her outlook into the future and trying to hold onto more hope during this ongoing difficult time in her life with her on mental health challenges and also with her husband's cancer.  Megan Whitney is making some progress and needs to continue her work with goal-directed behaviors to build on the progress she has already made and trying to keep moving forward in a healthier and more hopeful direction for herself into the future.  Goal review and progress/challenges noted with patient.  Next appointment within 2-3 weeks.   Barnie Bunde, LCSW

## 2024-03-15 ENCOUNTER — Telehealth: Payer: Self-pay | Admitting: Psychiatry

## 2024-03-15 NOTE — Telephone Encounter (Signed)
 Next appt is 04/08/24. Beverley called asking if we are going to bill Ingrezza  as well or not fill it? Her phone number is [phone]. She has called several times.

## 2024-03-15 NOTE — Telephone Encounter (Signed)
 She wouldn't be getting ingrezza  and austedo  they are for the same thing. It is noted she is on austedo . Will try to figure out what she is referring to.

## 2024-03-15 NOTE — Telephone Encounter (Signed)
 Noted she has contacted myself multiple times as well

## 2024-03-15 NOTE — Telephone Encounter (Signed)
 Clarification pt received her Austedo  today from [facility], she has not been sent any Ingrezza  and [facility] is calling to explain it again.

## 2024-03-20 NOTE — Therapy (Incomplete)
 OUTPATIENT PHYSICAL THERAPY THORACOLUMBAR TREATMENT/ERO/RECertification   Patient Name: Megan Whitney MRN: [id number] DOB:[birth year], 68 y.o., female Today's Date: 03/20/2024  END OF SESSION:            Past Medical History:  Diagnosis Date   ADD (attention deficit disorder)    Allergy    Seasonal   Anemia    History of GI blood loss   Anxiety    Arthritis    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    Esophageal stricture    Fracture of superior pubic ramus (HCC) 11/28/2018   GERD (gastroesophageal reflux disease)    Headache(784.0)    Hyperlipidemia    Interstitial cystitis    Joint pain    Lactose intolerance    Lung cancer (HCC) 2002   Neuromuscular disorder (HCC)    Obesity    Osteoarthritis    Osteoporosis    Palpitations    Sleep apnea    Doesn't use a CPAP   Suicidal ideation 01/20/2020   Swallowing difficulty    Tardive dyskinesia    Past Surgical History:  Procedure Laterality Date   BALLOON DILATION  05/16/2012   Procedure: BALLOON DILATION;  Surgeon: Lamar JONETTA Aho, MD;  Location: [facility] ENDOSCOPY;  Service: Endoscopy;  Laterality: N/A;   BUNIONECTOMY  2011   COLONOSCOPY     ENTEROSCOPY  05/16/2012   Procedure: ENTEROSCOPY;  Surgeon: Lamar JONETTA Aho, MD;  Location: [facility] ENDOSCOPY;  Service: Endoscopy;  Laterality: N/A;   JOINT REPLACEMENT     right shoulder durgery 25 yrs ago  1988   TOTAL HIP ARTHROPLASTY Bilateral 2006, 2008   bilateral   TUBAL LIGATION  1990   WEDGE RESECTION  2002   lung cancer   Patient Active Problem List   Diagnosis Date Noted   Neuromuscular disorder (HCC)    Attention deficit disorder 09/27/2023   Prediabetes 09/27/2023   DDD (degenerative disc disease), lumbar 03/27/2023   Class 1 obesity due to excess calories without serious comorbidity in adult 11/29/2022    Chronic thoracic back pain 09/22/2022   Neuroleptic-induced tardive dyskinesia 07/20/2022   Intertrigo 05/19/2022   Encounter for general adult medical examination with abnormal findings 03/22/2022   Arthritis of knee 08/17/2021   Polyp of colon 02/04/2021   Seasonal allergies 10/07/2020   Osteopenia 10/07/2020   Gait disturbance 10/07/2020   Hyperlipidemia 06/11/2020   Sleep apnea 06/11/2020   Stricture and stenosis of esophagus 05/16/2012   Hiatal hernia 05/16/2012   Dysphagia 05/15/2012   Depression    Bipolar disorder (HCC)    GERD (gastroesophageal reflux disease) 09/14/2010   History of colonic polyps 09/14/2010    PCP: Tobie Suzzane POUR, MD   REFERRING PROVIDER: Burnetta Aures, MD   REFERRING DIAG:   Scoliosis deformity of spine  Thoracic back pain  Thoracic spondylosis  Rationale for Evaluation and Treatment: Rehabilitation  THERAPY DIAG:  No diagnosis found.  ONSET DATE: Winter 2024 pain  SUBJECTIVE:  SUBJECTIVE STATEMENT: I have not gone to the gym and haven't been able to reinforce what I am learning. I have 0/10 presently but I am a 9/10 by end of the day.      EVAL- I have scoliosis and I need strengthening in my core.  I  thought I was only going to do aquatics. I presently do not have pain but I have had pain especially early last year.  I am worried about the sharp pains that I have in my back and when I vaccuum and mop. I can stand < 5 minutes especially later in the day when I am tired. No issues with sleeping. Anything later in the afternoon I have pain sitting/driving, standing longer than 5 minutes  I can walk for 10 minutes and then pain.Pain limits me from socializing and my husband currently has terminal cancer. I am shaky from medication, Pt does not report doing  any consistent exercise.  Pt has been increasing pain since last year.    PERTINENT HISTORY:  Hx of bipolar disorder, anxiety,ADD, Osteopenia, OSA, pelvic fx 2020, tripping, GERD, tardive dyskinesia, Bil THR  PAIN:  Are you having pain? Yes: NPRS scale: at rest 0/10 at worst 9/10 and later in day Pain location: thoracic and low back and especially on Right Pain description: aching Aggravating factors: standing later in day. Household chores. Bending over to get laundry, mopping and vacuuming outside changing the bird feeder with 5lb -10 lb bag Relieving factors: heating pad, medication once in a while   PRECAUTIONS: None  RED FLAGS: None   WEIGHT BEARING RESTRICTIONS: No  FALLS:  Has patient fallen in last 6 months? No  LIVING ENVIRONMENT: Lives with: lives with their spouse Lives in: House/apartment Stairs: steps outside and a ramp Has following equipment at home: None  OCCUPATION: retired  PLOF: Independent  PATIENT GOALS: Pt would like to be able to be stronger  NEXT MD VISIT: TBD  OBJECTIVE:  Note: Objective measures were completed at Evaluation unless otherwise noted.  DIAGNOSTIC FINDINGS:  See medical record Lumbar radiographs from 2024 shows mild curvature of the spine, mild multilevel degenerative changes and grade 1 anterolisthesis of L4 on L5  PATIENT SURVEYS:  Modified Oswestry 52  02-01-24 16% 03-05-24 26%  COGNITION: Overall cognitive status: Within functional limits for tasks assessed     SENSATION: WFL has tardive dyskinesia  MUSCLE LENGTH: Hamstrings: Right bil tightness   POSTURE: rounded shoulders, decreased lumbar lordosis, increased thoracic kyphosis, weight shift left, and scoliotic curve C curve ot left Left convex side  PALPATION: TTP over thoracic right spine  LUMBAR ROM:   *did not test forward flexion due to osteopenia /self report of osteoporosis  AROM eval 02-01-24 03-05-24  Flexion  flexed at 20 degrees in stance Fingertips to  ankle Fingertips to ankle  Extension  Only to neutral 10 degrees  feels good Pt restricted to neutral with pain in small of back  Right lateral flexion 50% 75% 50%  Left lateral flexion 75% limited * 75% 50%  Right rotation     Left rotation     Key: [facility] = within functional limits not formally assessed, * = concordant pain, s = stiffness/stretching sensation, NT = not tested)     LOWER EXTREMITY ROM:    corrected 02-08-24  Active  Right eval Left eval R/L 02-01-24 R/L 03-05-24  Hip flexion 60 60 90/90 70/70  Hip extension      Hip abduction  Hip adduction      Hip internal rotation      Hip external rotation      Knee flexion      Knee extension      Ankle dorsiflexion      Ankle plantarflexion      Ankle inversion      Ankle eversion      Key: WFL = within functional limits not formally assessed, * = concordant pain, s = stiffness/stretching sensation, NT = not tested)     LOWER EXTREMITY MMT:   mostly WFL for tasks assessed  MMT Right eval Left eval R/L 03-05-24  Hip flexion 5 4 4+/4  Hip extension 4- 4- 4/4-  Hip abduction 4- 4- 4-/4-  Hip adduction     Hip internal rotation     Hip external rotation     Knee flexion 4 5 4/5  Knee extension 5 5 5/5  Ankle dorsiflexion 4 4 4/4  Ankle plantarflexion     Ankle inversion     Ankle eversion     (Blank rows = not tested, score listed is out of 5 possible points.  N = WNL, D = diminished, C = clear for gross weakness with myotome testing, * = concordant pain with testing)   LUMBAR SPECIAL TESTS:  Straight leg raise test: Negative and Slump test: Negative  FUNCTIONAL TESTS:  5 times sit to stand: 19.47 2 minute walk test: 351 ft   Single leg stance  LT 2 sec  RT 0 sec   01-04-24  1154 ft (1500-1905 ft) 01-16-24  5 X STS 11.67 sec 03-05-24 5 x sts 13.47 sec GAIT: Distance walked: 351 ft Assistive device utilized: None Level of assistance: Complete Independence Comments: Pt leans to left while walking,     [facility] Adult PT Treatment:                                                DATE: *** Therapeutic Exercise: *** Manual Therapy: *** Neuromuscular re-ed: *** Therapeutic Activity: *** Modalities: *** Self Care: ***  [facility] Adult PT Treatment:                                                DATE: 03-05-24 ERO Therapeutic Exercise: Prone plank on elbows  15 sec sec VC and TC x 2 Bear crawl Q ped plank 30 sec, 15 sec, 18 sec  VC and TC use of towel for wrist comfort Bridge with ball 3 x 10 L stretch  Therapeutic Activity: STS with 10# KB 3 x 10 with 90 sec rest 1231.4 ft (1500 ft to 1905 Ft) Standing march   Self Care: Lifting principles reinforce with questions answered  Night time routines with hot shower and stretching for better sleep  [facility] Adult PT Treatment:                                                DATE: 02-08-24  Therapeutic Exercise: Galileo leg press 1 plate x 10 bil and then 2 plates 2 x 5 reps bil Cybex hip  abduction 12.5 lb 2 x 10 on R and L  Cybex hip extension 12.5 lb 2 x 10 on R and L  Prone plank on elbows  28 sec VC and TC Bear crawl Q ped plank 20 sec, 25 sec, 30 sec  VC and TC Wall push up plus 2  x 8 Reverse lunge 1 x 5 on R and L  Therapeutic Activity: Step ups on 8 inch steps.  1 x 10 on R and 1 x 10 on L STS 15 #  3 x 8  [facility] Adult PT Treatment:                                                DATE: 02/05/24  Heel raise with stomp 10 x 2 - pt needs hand out for this- lost it Wall lift off 2 x 8  Wall slide 10 x 10 sec hold  Bridge with ball squeeze x 10 90/90 ab hold 10 sec x 3  SKTC x 5  LTR x 10 Min cues for below:  Hip hinge with KB handle touch x 5 at 6 inch, 4 inch and 0 inches from floor KB lift x 8 from 6 inch , 4 inch and 0 inches from floor x 8 at each level - with extension between sets  Updated HEP into sections    [facility] Adult PT Treatment:                                                DATE: 02-01-24 ODI 16 % Therapeutic  Exercise: Wall lift off 2 x 8 Bridge with ball 2 x 10 Heel raise with stomp 2 x 10 Wall slide 10 x 10 sec hold  Therapeutic Activity: Lifting principles with demo lifting on  elevated 6 inch step Hip hinge practice Hip hinge with KB handle touch x 10 Deadlift from elevated 6 inch step backwards Deadlift from elevated 6 inch step forwards 3x8  with extension between sets  [facility] Adult PT Treatment:                                                DATE: 01-30-24 Therapeutic Exercise: Osteoporosis  postural  correction with 1) lying in decompression Exercise for 5 minutes with deep breathing 2) shoulder press hold 2-3 sec and repeat 5 x 3) Head press hold 2-3 seconds repeat 5 x 4) leg press 2-3 seconds 6 x each leg 5) leg lengthening 2-3 seconds 6 x each leg  Therapeutic Activity: Hip hinge with dowel rod on 2 points due to scoliosis mid back and sacral area Sit to stand x 10  Self Care: Osteoporosis education given 2/3 of handout for education about avoiding sitting and twisting and standing and extension and twisting Simulating gardening scenarios and problem solving with osteoporosis precautions Posture and body mechanic education   [facility] Adult PT Treatment:  DATE: 01-16-24 Standing exercises and deadlift safety Therapeutic Exercise: Chin tuck exercise 10 x 10 sec 90 90 abdominal setting.  Physioball bridge Hip hinge with dowel Therapeutic Activity: 5 x STS  11.97 sec STS with !5 # KB  3 x 10 Proper lifting procedure for weight Deadlift backwards on elevated 8 inch box x 6  Deadlift forward on elevated  8 in box x 6  Self Care: Education on posture and body mechanics Education on using body mechanics around household chores with handouts [facility] Adult PT Treatment:                                                DATE: 01-11-24 Therapeutic Exercise: Supine pelvic tilt 20 x with VC and TC for correct execution STKC on Right and Left 5 x  for 10 sec each Bridge with ball 2 x 10 LTR Star pattern Green T band 3 x 10 with chin tuck Chin tuck x 10 for 10 sec each Child's pose stretch forward , and to sides added to HEP  Therapeutic Activity: STS with 5 lb DB 2  x 10 Reverse lunge x 5 each side  Self Care: DOMs education and progressive strengthening and need for good nutrition and hydration  [facility] Adult PT Treatment:                                                DATE: 01-04-24 Therapeutic Exercise: Supine pelvic tilt 20 x with VC and TC for correct execution STKC on Right and Left 5 x for 10 sec each LTR Bridge with ball 2 x 10 Star pattern Red T band 3 x 10 with chin tuck Chin tuck x 10 for 10 sec each   Therapeutic Activity: STS with 5 lb DB 3 x 10  1154 ft (1500-1905 ft)  Self Care: Walking program education reinforcement  Reinforcing lifting principles  TREATMENT DATE: eval and issue HEP  Aquatic exercise therapy information                                                                                                                                 PATIENT EDUCATION:  Education details: POC Explanation of findings HEP issue Aquatic therapy information Person educated: Patient Education method: Explanation, Demonstration, Tactile cues, Verbal cues, and Handouts Education comprehension: verbalized understanding, returned demonstration, verbal cues required, tactile cues required, and needs further education  HOME EXERCISE PROGRAM: Access Code: [email or web address] updated 02-01-24 URL: [email or web address] Date: 02/01/2024 Prepared by: Graydon Dingwall  Exercises - Supine Pelvic Tilt  - 1 x daily - 7 x weekly - 3 sets - 10 reps - Supine Single Knee to Chest Stretch  -  2 x daily - 7 x weekly - 1 sets - 5 reps - 10 hold - Supine Lower Trunk Rotation  - 1 x daily - 7 x weekly - 3 sets - 10 reps - Supine Bridge with Mini Swiss Ball Between Knees  - 1 x daily - 7 x weekly - 3 sets - 10 reps - Sit to  stand with sink support Movement snack  - 1 x daily - 7 x weekly - 3 sets - 10 reps - Supine Cervical Retraction with Towel  - 1 x daily - 7 x weekly - 1 sets - 10 reps - 10 sec hold - Standing Shoulder Diagonal Horizontal Abduction 60/120 Degrees with Resistance  - 1 x daily - 7 x weekly - 3 sets - 10 reps - Standing Shoulder Horizontal Abduction with Resistance  - 1 x daily - 7 x weekly - 3 sets - 10 reps - childs pose stretch forward and to side  - 1 x daily - 7 x weekly - 1 sets - 6 reps - 15-20 sec hold - Supine90/90 abdominal Bracing resting on couch  - 1 x daily - 7 x weekly - 1 sets - 5 reps - 10-15 hold - Standing Hip Hinge with Dowel  - 1 x daily - 7 x weekly - 3 sets - 10 reps - Deadlift on elevated 8 inch box with 10 lb  - 1 x daily - 3 x weekly - 1 sets - 6 reps - Wall Slide with Posterior Pelvic Tilt  - 1 x daily - 7 x weekly - 1 sets - 10 reps - 10 sec hold - Heel Raises with Counter Support  - 1 x daily - 7 x weekly - 3 sets - 10 reps Added 02-08-24 - Bear Plank from Quadruped  - 1 x daily - 7 x weekly - 3 sets - 10 reps - Wall Push Up with Plus  - 1 x daily - 7 x weekly - 2 sets - 8 reps ASSESSMENT:  CLINICAL IMPRESSION: Pt returns to clinic with 0/10 pain. But she does struggle with pain later in the day to 9/10 pain. Pt expresses a lack of confidence in performing exercises and home and has not been to a gym as planned.   Pt has mentioned  she  was feeling stronger  but she expresses increased weakness now. and has interest in learning how to safely use gym equipment. Reinforcing HEP for home use and will extend RX for additional 5-6 weeks to reinforce safety with HEP and gym equipment until completion of goals. Re certification for next 6 weeks for 1 x a week or completion of goals.      EVAL-Patient is a 68 y.o. female who was seen today for physical therapy evaluation and treatment for thoracic and lumbar pain due to scoliosis and sedentary lifestyle. Pt does not report any  routine exercise and does not walk more than 5 minutes. Pt is caring for terminally ill husband and needs to improve strength, and pain control in order to perform household duties and caring for husband. Pt will also benefit from aquatic therapy and skilled PT to improve impairments   OBJECTIVE IMPAIRMENTS: decreased activity tolerance, decreased mobility, difficulty walking, decreased ROM, decreased strength, impaired flexibility, improper body mechanics, postural dysfunction, obesity, pain, and scoliosis C curve .   ACTIVITY LIMITATIONS: carrying, lifting, bending, sitting, standing, squatting, stairs, locomotion level, and caring for others  PARTICIPATION LIMITATIONS: meal prep, cleaning, laundry, driving, and community  activity  PERSONAL FACTORS: Hx of bipolar disorder, anxiety,ADD, Osteopenia, OSA, pelvic fx 2020, tripping, GERD, tardive dyskinesia, Bil THR are also affecting patient's functional outcome.   REHAB POTENTIAL: Good  CLINICAL DECISION MAKING: Evolving/moderate complexity  EVALUATION COMPLEXITY: Moderate   GOALS: Goals reviewed with patient? Yes  SHORT TERM GOALS: Target date: 01-23-24  Pt will demonstrate appropriate understanding and performance of initially prescribed HEP in order to facilitate improved independence with management of symptoms Baseline:no knowledge 01-16-24  Education on posture and body mechanics 01-30-24  Osteoporosis education Goal status MET 01-30-24  2.  Pt will perform 5xSTS in <13.0 sec in order to demonstrate reduced fall risk and improved functional independence. (MCID of 2.3sec) Baseline: 19.47 sec 01-16-24 11:97 sec Goal status: MET  3.  Demonstrate understanding of elongation of spine in sitting posture and be more conscious of position and posture throughout the day.  Baseline: Left C Curve scoliorsis Goal status: MET 01-28-24  4.  Begin Aquatic therapy for management of movement and pain control Baseline: No knowledge Goal  status:Deferred   LONG TERM GOALS: Target date: 02-20-24   revised 04-16-24  Pt will be I with advanced HEP Baseline: no knowledge 03-05-24  using current HEP Goal status:ONGOING  2.  Demonstrate and verbalize techniques to reduce the risk of re-injury including: lifting, posture, body mechanics mindful of scoliosis/  Baseline:  02-01-24  completed education on scoliosis and precautians 03-05-24  continuing education for lifting heavier lifting Goal status:ONGOING  3.  Jaima will show a >/= 12 pt improvement in their ODI score (MCID is 12 pts) as a proxy for functional improvement Baseline: 52 % 02-01-24  16% 03-05-25  26% Goal status: MET  4.  Pt will demonstrate increase lumbar and thoracic strength by being able to carry 5- 10 lb bird seed bag without exacerbating thoracic /lumbar pain greater than 3/10 Baseline: at worst 9/10 02-01-24  learning deadlift on elevated step 6 inches 03-05-24  carrying 10 lb on STS Goal status:ONGOING  5.  Pt will be able to perform 6 MWT within normal limits for age 44 ft to 1905 ft Baseline: 351 ft 03-05-24  1154 ft (1500-1905 ft) Goal status: ONGOING  6.  Pt will improve endurance by walking at least 20 minutes 3 times a week Baseline: No current routine exercise. Does not walk greater than 5 min 03-05-24  Pt is not walking currently Goal statusONGOING  PLAN:  PT FREQUENCY: 1-2x/week  1 x  a week  PT DURATION: 8 weeks   6 weeks  PLANNED INTERVENTIONS: 97164- PT Re-evaluation, 97750- Physical Performance Testing, 97110-Therapeutic exercises, 97530- Therapeutic activity, [id number]- Neuromuscular re-education, 97535- Self Care, [id number]- Manual therapy, [id number]- Gait training, [phone]- Aquatic Therapy, [phone]- Electrical stimulation (manual), Patient/Family education, Balance training, Stair training, Taping, Dry Needling, Joint mobilization, Spinal mobilization, Cryotherapy, and Moist heat.  PLAN FOR NEXT SESSION: issue HEP, TPDN as needed for thoracic  pain

## 2024-03-21 ENCOUNTER — Ambulatory Visit: Admitting: Physical Therapy

## 2024-03-21 DIAGNOSIS — F319 Bipolar disorder, unspecified: Secondary | ICD-10-CM | POA: Diagnosis not present

## 2024-03-21 DIAGNOSIS — Z79899 Other long term (current) drug therapy: Secondary | ICD-10-CM | POA: Diagnosis not present

## 2024-03-22 ENCOUNTER — Encounter: Payer: Self-pay | Admitting: Psychiatry

## 2024-03-22 DIAGNOSIS — F988 Other specified behavioral and emotional disorders with onset usually occurring in childhood and adolescence: Secondary | ICD-10-CM | POA: Diagnosis not present

## 2024-03-22 DIAGNOSIS — E559 Vitamin D deficiency, unspecified: Secondary | ICD-10-CM | POA: Diagnosis not present

## 2024-03-22 DIAGNOSIS — E782 Mixed hyperlipidemia: Secondary | ICD-10-CM | POA: Diagnosis not present

## 2024-03-22 DIAGNOSIS — G2401 Drug induced subacute dyskinesia: Secondary | ICD-10-CM | POA: Diagnosis not present

## 2024-03-22 DIAGNOSIS — F3132 Bipolar disorder, current episode depressed, moderate: Secondary | ICD-10-CM | POA: Diagnosis not present

## 2024-03-22 DIAGNOSIS — T43505A Adverse effect of unspecified antipsychotics and neuroleptics, initial encounter: Secondary | ICD-10-CM | POA: Diagnosis not present

## 2024-03-22 DIAGNOSIS — R7303 Prediabetes: Secondary | ICD-10-CM | POA: Diagnosis not present

## 2024-03-23 LAB — CMP14+EGFR
ALT: 17 IU/L (ref 0–32)
AST: 27 IU/L (ref 0–40)
Albumin: 4.5 g/dL (ref 3.9–4.9)
Alkaline Phosphatase: 71 IU/L (ref 44–121)
BUN/Creatinine Ratio: 13 (ref 12–28)
BUN: 14 mg/dL (ref 8–27)
Bilirubin Total: 0.5 mg/dL (ref 0.0–1.2)
CO2: 22 mmol/L (ref 20–29)
Calcium: 9.7 mg/dL (ref 8.7–10.3)
Chloride: 104 mmol/L (ref 96–106)
Creatinine, Ser: 1.08 mg/dL — ABNORMAL HIGH (ref 0.57–1.00)
Globulin, Total: 2.3 g/dL (ref 1.5–4.5)
Glucose: 99 mg/dL (ref 70–99)
Potassium: 4.1 mmol/L (ref 3.5–5.2)
Sodium: 142 mmol/L (ref 134–144)
Total Protein: 6.8 g/dL (ref 6.0–8.5)
eGFR: 56 mL/min/1.73 — ABNORMAL LOW (ref >59)

## 2024-03-23 LAB — HEMOGLOBIN A1C
Est. average glucose Bld gHb Est-mCnc: 105 mg/dL
Hgb A1c MFr Bld: 5.3 % (ref 4.8–5.6)

## 2024-03-23 LAB — CBC WITH DIFFERENTIAL/PLATELET
Basophils Absolute: 0 x10E3/uL (ref 0.0–0.2)
Basos: 0 %
EOS (ABSOLUTE): 0.3 x10E3/uL (ref 0.0–0.4)
Eos: 5 %
Hematocrit: 42 % (ref 34.0–46.6)
Hemoglobin: 13.6 g/dL (ref 11.1–15.9)
Immature Grans (Abs): 0 x10E3/uL (ref 0.0–0.1)
Immature Granulocytes: 0 %
Lymphocytes Absolute: 2.8 x10E3/uL (ref 0.7–3.1)
Lymphs: 41 %
MCH: 30 pg (ref 26.6–33.0)
MCHC: 32.4 g/dL (ref 31.5–35.7)
MCV: 93 fL (ref 79–97)
Monocytes Absolute: 0.7 x10E3/uL (ref 0.1–0.9)
Monocytes: 10 %
Neutrophils Absolute: 3 x10E3/uL (ref 1.4–7.0)
Neutrophils: 44 %
Platelets: 220 x10E3/uL (ref 150–450)
RBC: 4.54 x10E6/uL (ref 3.77–5.28)
RDW: 12.7 % (ref 11.7–15.4)
WBC: 6.9 x10E3/uL (ref 3.4–10.8)

## 2024-03-23 LAB — LIPID PANEL
Chol/HDL Ratio: 2.1 ratio (ref 0.0–4.4)
Cholesterol, Total: 197 mg/dL (ref 100–199)
HDL: 92 mg/dL (ref >39)
LDL Chol Calc (NIH): 92 mg/dL (ref 0–99)
Triglycerides: 74 mg/dL (ref 0–149)
VLDL Cholesterol Cal: 13 mg/dL (ref 5–40)

## 2024-03-23 LAB — TSH: TSH: 1.37 u[IU]/mL (ref 0.450–4.500)

## 2024-03-23 LAB — VITAMIN D 25 HYDROXY (VIT D DEFICIENCY, FRACTURES): Vit D, 25-Hydroxy: 54.9 ng/mL (ref 30.0–100.0)

## 2024-03-26 ENCOUNTER — Telehealth: Payer: Self-pay

## 2024-03-26 ENCOUNTER — Other Ambulatory Visit: Payer: Self-pay | Admitting: Psychiatry

## 2024-03-26 ENCOUNTER — Ambulatory Visit: Admitting: Physical Therapy

## 2024-03-26 MED ORDER — MODAFINIL 100 MG PO TABS
50.0000 mg | ORAL_TABLET | Freq: Every morning | ORAL | 0 refills | Status: DC
Start: 2024-03-26 — End: 2024-04-08

## 2024-03-26 NOTE — Telephone Encounter (Signed)
 I sent in RX modafinil  100 mg tab, 1/2 tab in AM.  Should be less likely to cause teeth grinding.  It looks like it will be covered.  If not may need to send it somewhere she can use [facility]

## 2024-03-26 NOTE — Telephone Encounter (Signed)
 Pt contacted office to report the Focalin  she is taking is causing the teeth grinding and other facial movements. She is asking what can be done? She really needs it.  Pt was at the oral surgeon yesterday morning to have a tooth removed and she thinks its because of the teeth grinding. Informed pt I would discuss with Dr. Geoffry and follow up with her.

## 2024-03-26 NOTE — Telephone Encounter (Signed)
Pt aware.

## 2024-03-26 NOTE — Telephone Encounter (Signed)
 It required a PA but I did get it approved effective 03/26/24-09/22/24 with [facility] Team. I will let pt know.

## 2024-03-27 ENCOUNTER — Encounter: Payer: Self-pay | Admitting: Internal Medicine

## 2024-03-27 ENCOUNTER — Ambulatory Visit (INDEPENDENT_AMBULATORY_CARE_PROVIDER_SITE_OTHER): Payer: PPO | Admitting: Internal Medicine

## 2024-03-27 VITALS — BP 109/72 | HR 93 | Ht 60.0 in | Wt 115.4 lb

## 2024-03-27 DIAGNOSIS — F319 Bipolar disorder, unspecified: Secondary | ICD-10-CM

## 2024-03-27 DIAGNOSIS — K219 Gastro-esophageal reflux disease without esophagitis: Secondary | ICD-10-CM | POA: Diagnosis not present

## 2024-03-27 DIAGNOSIS — Z0001 Encounter for general adult medical examination with abnormal findings: Secondary | ICD-10-CM

## 2024-03-27 DIAGNOSIS — M51362 Other intervertebral disc degeneration, lumbar region with discogenic back pain and lower extremity pain: Secondary | ICD-10-CM

## 2024-03-27 DIAGNOSIS — R7989 Other specified abnormal findings of blood chemistry: Secondary | ICD-10-CM | POA: Diagnosis not present

## 2024-03-27 DIAGNOSIS — E782 Mixed hyperlipidemia: Secondary | ICD-10-CM

## 2024-03-27 DIAGNOSIS — F900 Attention-deficit hyperactivity disorder, predominantly inattentive type: Secondary | ICD-10-CM

## 2024-03-27 NOTE — Assessment & Plan Note (Signed)
 On Modafinil  now Was on Focalin 

## 2024-03-27 NOTE — Assessment & Plan Note (Addendum)
 On Atorvastatin  20 mg QD Lipid profile reviewed

## 2024-03-27 NOTE — Patient Instructions (Signed)
 Please continue to take medications as prescribed.  Please continue to follow low carb diet and perform moderate exercise/walking as tolerated.  Please maintain at least 64 ounces of fluid intake in a day.

## 2024-03-27 NOTE — Assessment & Plan Note (Addendum)
 Chronic low back pain Check x-ray of lumbar spine-likely has DDD of lumbar spine Previous MRI lumbar spine reviewed Had referred to PM&R - has tried PT recently Avoid heavy lifting and frequent bending Continue Tylenol  as needed for back pain, avoided oral NSAIDs due to GERD Heating pad and/or back brace as needed

## 2024-03-27 NOTE — Assessment & Plan Note (Signed)
Usually well-controlled, on Pantoprazole 40 mg QD -and advised to avoid twice daily dosing Advised to avoid hot or spicy food Try to be sitting upright at least for 30 minutes after meal

## 2024-03-27 NOTE — Progress Notes (Signed)
 Established Patient Office Visit  Subjective:  Patient ID: Megan Whitney, female    DOB: 1955-10-04  Age: 68 y.o. MRN: [id number]  CC:  Chief Complaint  Patient presents with   Annual Exam    Cpe   Results    Would like lab results.     HPI MADELYNE Whitney is a 68 y.o. female with past medical history of bipolar disorder, ADD, anxiety, OSA, GERD, HLD and osteopenia who presents for annual physical.    Bipolar disorder: She is on multiple medications for bipolar disorder and ADD. She follows up with Psychiatrist and has a therapist, who she visits every week.  GERD and hiatal hernia: She takes Protonix  once daily for it.  She was referred to general surgeon by her GI provider for hiatal hernia. She currently denies any nausea or vomiting, but has chronic acid reflux and epigastric pain.  Denies any melena or hematochezia currently.  She reports chronic, intermittent low back pain.  Pain is sharp, nonradiating and and is worse in the evening.  Denies any recent fall or injury.  Denies any new numbness or tingling of the LE.  Denies any urinary or stool incontinence.  Chart review shows old MRI in 2006, which showed disc protrusion at L2-3 level.  She has tried Tylenol  without much relief.  She also did PT without much benefit.  Blood tests were reviewed and discussed with the patient in detail.     Past Medical History:  Diagnosis Date   ADD (attention deficit disorder)    Allergy    Seasonal   Anemia    History of GI blood loss   Anxiety    Arthritis    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    Esophageal stricture    Fracture of superior pubic ramus (HCC) 11/28/2018   GERD (gastroesophageal reflux disease)    Headache(784.0)    Hyperlipidemia    Interstitial cystitis    Joint pain    Lactose intolerance    Lung  cancer (HCC) 2002   Neuromuscular disorder (HCC)    Obesity    Osteoarthritis    Osteoporosis    Palpitations    Sleep apnea    Doesn't use a CPAP   Suicidal ideation 01/20/2020   Swallowing difficulty    Tardive dyskinesia     Past Surgical History:  Procedure Laterality Date   BALLOON DILATION  05/16/2012   Procedure: BALLOON DILATION;  Surgeon: Lamar JONETTA Aho, MD;  Location: [facility] ENDOSCOPY;  Service: Endoscopy;  Laterality: N/A;   BUNIONECTOMY  2011   COLONOSCOPY     ENTEROSCOPY  05/16/2012   Procedure: ENTEROSCOPY;  Surgeon: Lamar JONETTA Aho, MD;  Location: [facility] ENDOSCOPY;  Service: Endoscopy;  Laterality: N/A;   JOINT REPLACEMENT     right shoulder durgery 25 yrs ago  1988   TOTAL HIP ARTHROPLASTY Bilateral 2006, 2008   bilateral   TUBAL LIGATION  1990   WEDGE RESECTION  2002   lung cancer    Family History  Problem Relation Age of Onset   Arthritis Mother    Hearing loss Mother    Hyperlipidemia Mother    Hypertension Mother    Depression Mother    Anxiety disorder Mother    Obesity Mother  Sudden death Mother    Hypertension Father    Diabetes Mellitus II Father    Heart disease Father    Arthritis Father    Cancer Father        Brain   COPD Father    Diabetes Father    Hyperlipidemia Father    Sleep apnea Father    Early death Sister        Aneroxia/Bulimic   Depression Brother    Early death Hydrographic surveyor Accident   Stroke Maternal Grandmother    Hypertension Maternal Grandmother    Arthritis Maternal Grandfather    Heart attack Maternal Grandfather    Hearing loss Maternal Grandfather    Depression Daughter    Drug abuse Daughter    Heart disease Daughter    Hypertension Daughter    Colon cancer Neg Hx    Esophageal cancer Neg Hx    Rectal cancer Neg Hx    Colon polyps Neg Hx    Stomach cancer Neg Hx     Social History   Socioeconomic History   Marital status: Married    Spouse name: Not on file   Number of children: 1   Years  of education: 12   Highest education level: Not on file  Occupational History   Occupation: retired  Tobacco Use   Smoking status: Never   Smokeless tobacco: Never  Vaping Use   Vaping status: Never Used  Substance and Sexual Activity   Alcohol use: Not Currently    Alcohol/week: 1.0 standard drink of alcohol    Types: 1 Glasses of wine per week    Comment: 1 glass wine occassionally   Drug use: No   Sexual activity: Yes  Other Topics Concern   Not on file  Social History Narrative   Pt lives in [place] with husband Francis.  Followed by Dr. Geoffry for psychiatry and Marval Bunde for therapy.   Right handed   Drinks caffeine   One story home   Married lives with husband   retired   Chief Executive Officer Drivers of Corporate investment banker Strain: Low Risk  (06/01/2023)   Overall Financial Resource Strain (CARDIA)    Difficulty of Paying Living Expenses: Not hard at all  Food Insecurity: No Food Insecurity (06/22/2023)   Hunger Vital Sign    Worried About Running Out of Food in the Last Year: Never true    Ran Out of Food in the Last Year: Never true  Transportation Needs: No Transportation Needs (06/22/2023)   PRAPARE - Administrator, Civil Service (Medical): No    Lack of Transportation (Non-Medical): No  Physical Activity: Inactive (06/01/2023)   Exercise Vital Sign    Days of Exercise per Week: 0 days    Minutes of Exercise per Session: 0 min  Stress: Stress Concern Present (06/01/2023)   [employer] of Occupational Health - Occupational Stress Questionnaire    Feeling of Stress : Very much  Social Connections: Socially Integrated (06/01/2023)   Social Connection and Isolation Panel    Frequency of Communication with Friends and Family: More than three times a week    Frequency of Social Gatherings with Friends and Family: More than three times a week    Attends Religious Services: More than 4 times per year    Active Member of [employer] or Organizations: Yes     Attends Banker Meetings: More than 4 times per year  Marital Status: Married  Catering manager Violence: Not At Risk (06/01/2023)   Humiliation, Afraid, Rape, and Kick questionnaire    Fear of Current or Ex-Partner: No    Emotionally Abused: No    Physically Abused: No    Sexually Abused: No    Outpatient Medications Prior to Visit  Medication Sig Dispense Refill   acetaminophen  (TYLENOL ) 650 MG CR tablet Take 1,300 mg by mouth as needed for pain.     amoxicillin  (AMOXIL ) 500 MG capsule SMARTSIG:4 Capsule(s) By Mouth     atorvastatin  (LIPITOR) 20 MG tablet TAKE 1 TABLET EACH EVENING. 90 tablet 3   cloZAPine  (CLOZARIL ) 25 MG tablet Take 3 tablets (75 mg total) by mouth at bedtime. 90 tablet 2   Deutetrabenazine  ER (AUSTEDO  XR) 48 MG TB24 Take 1 tablet by mouth daily. 30 tablet 2   dexmethylphenidate  (FOCALIN ) 10 MG tablet Take 2 tablets (20 mg total) by mouth 2 (two) times daily. 120 tablet 0   dexmethylphenidate  (FOCALIN ) 10 MG tablet Take 2 tablets (20 mg total) by mouth 2 (two) times daily. 120 tablet 0   Ferrous Gluconate  (IRON  27 PO) Take by mouth.     ketoconazole  (NIZORAL ) 2 % cream Apply 1 Application topically daily. 60 g 0   lamoTRIgine  (LAMICTAL ) 100 MG tablet Take 1 tablet (100 mg) in the am and take 2 tablets (200 mg) in the pm 90 tablet 2   modafinil  (PROVIGIL ) 100 MG tablet Take 0.5 tablets (50 mg total) by mouth every morning. 15 tablet 0   Multiple Vitamin (MULTIVITAMIN ADULT PO) Take by mouth.     pantoprazole  (PROTONIX ) 40 MG tablet TAKE (1) TABLET BY MOUTH TWICE DAILY. 180 tablet 1   senna (SENOKOT) 8.6 MG tablet Take 1 tablet by mouth daily.     Vilazodone  HCl (VIIBRYD ) 40 MG TABS Take 1 tablet (40 mg total) by mouth daily. 30 tablet 2   Vilazodone  HCl 20 MG TABS Take 1 tablet (20 mg) by mouth daily along with a 40 mg tablet. 30 tablet 0   No facility-administered medications prior to visit.    Allergies  Allergen Reactions   Azithromycin  Anaphylaxis   Penicillins Anaphylaxis    DID THE REACTION INVOLVE: Swelling of the face/tongue/throat, SOB, or low BP? Yes Sudden or severe rash/hives, skin peeling, or the inside of the mouth or nose? Yes Did it require medical treatment? No When did it last happen?       If all above answers are NO, may proceed with cephalosporin use.  Patient reacts to Z pack.  HAS Taken amoxicillin  fine.   Adhesive [Tape] Other (See Comments)    On bandaids    ROS Review of Systems  Constitutional:  Negative for chills and fever.  HENT:  Negative for congestion, sinus pressure, sinus pain and sore throat.   Eyes:  Negative for pain and discharge.  Respiratory:  Negative for cough and shortness of breath.   Cardiovascular:  Negative for palpitations.  Gastrointestinal:  Negative for diarrhea, nausea and vomiting.  Endocrine: Negative for polydipsia and polyuria.  Genitourinary:  Negative for dysuria and hematuria.  Musculoskeletal:  Positive for back pain. Negative for neck pain and neck stiffness.  Skin:  Negative for rash.  Neurological:  Negative for dizziness and weakness.  Psychiatric/Behavioral:  Positive for sleep disturbance. Negative for agitation and behavioral problems. The patient is nervous/anxious.       Objective:    Physical Exam Vitals reviewed.  Constitutional:  General: She is not in acute distress.    Appearance: She is obese. She is not diaphoretic.  HENT:     Head: Normocephalic and atraumatic.     Nose: Nose normal. No nasal tenderness.     Right Sinus: No maxillary sinus tenderness.     Left Sinus: No maxillary sinus tenderness.     Mouth/Throat:     Mouth: Mucous membranes are moist.  Eyes:     General: No scleral icterus.    Extraocular Movements: Extraocular movements intact.  Cardiovascular:     Rate and Rhythm: Normal rate and regular rhythm.     Heart sounds: Normal heart sounds. No murmur heard. Pulmonary:     Breath sounds: Normal breath  sounds. No wheezing or rales.  Abdominal:     Palpations: Abdomen is soft.     Tenderness: There is no abdominal tenderness.  Musculoskeletal:     Cervical back: Neck supple. No tenderness.     Lumbar back: Tenderness present. Decreased range of motion.     Right lower leg: No edema.     Left lower leg: No edema.  Skin:    General: Skin is warm.     Findings: No rash.  Neurological:     General: No focal deficit present.     Mental Status: She is alert and oriented to person, place, and time.     Sensory: No sensory deficit.     Motor: No weakness.     Gait: Gait abnormal.     Comments: Resting tremor of right hand  Psychiatric:        Behavior: Behavior normal.        Thought Content: Thought content normal.     BP 109/72 (BP Location: Left Arm)   Pulse 93   Ht 5' (1.524 m)   Wt 115 lb 6.4 oz (52.3 kg)   BMI 22.54 kg/m  Wt Readings from Last 3 Encounters:  03/27/24 115 lb 6.4 oz (52.3 kg)  11/17/23 124 lb (56.2 kg)  10/24/23 124 lb (56.2 kg)    Lab Results  Component Value Date   TSH 1.370 03/22/2024   Lab Results  Component Value Date   WBC 6.9 03/22/2024   HGB 13.6 03/22/2024   HCT 42.0 03/22/2024   MCV 93 03/22/2024   PLT 220 03/22/2024   Lab Results  Component Value Date   NA 142 03/22/2024   K 4.1 03/22/2024   CO2 22 03/22/2024   GLUCOSE 99 03/22/2024   BUN 14 03/22/2024   CREATININE 1.08 (H) 03/22/2024   BILITOT 0.5 03/22/2024   ALKPHOS 71 03/22/2024   AST 27 03/22/2024   ALT 17 03/22/2024   PROT 6.8 03/22/2024   ALBUMIN 4.5 03/22/2024   CALCIUM  9.7 03/22/2024   ANIONGAP 7 08/02/2023   EGFR 56 (L) 03/22/2024   GFR 69.31 04/04/2019   Lab Results  Component Value Date   CHOL 197 03/22/2024   Lab Results  Component Value Date   HDL 92 03/22/2024   Lab Results  Component Value Date   LDLCALC 92 03/22/2024   Lab Results  Component Value Date   TRIG 74 03/22/2024   Lab Results  Component Value Date   CHOLHDL 2.1 03/22/2024   Lab  Results  Component Value Date   HGBA1C 5.3 03/22/2024      Assessment & Plan:   Problem List Items Addressed This Visit       Digestive   GERD (gastroesophageal reflux disease)  Usually well-controlled, on Pantoprazole  40 mg QD -and advised to avoid twice daily dosing Advised to avoid hot or spicy food Try to be sitting upright at least for 30 minutes after meal        Musculoskeletal and Integument   DDD (degenerative disc disease), lumbar   Chronic low back pain Check x-ray of lumbar spine-likely has DDD of lumbar spine Previous MRI lumbar spine reviewed Had referred to PM&R - has tried PT recently Avoid heavy lifting and frequent bending Continue Tylenol  as needed for back pain, avoided oral NSAIDs due to GERD Heating pad and/or back brace as needed         Other   Bipolar disorder (HCC)   On Viibryd , Lamictal , Austedo , clozapine  Follows up with Psychiatry [facility] - 30/30      Relevant Orders   CBC with Differential/Platelet   Basic Metabolic Panel (BMET)   Hyperlipidemia   On Atorvastatin  20 mg QD Lipid profile reviewed      Encounter for general adult medical examination with abnormal findings - Primary   Physical exam as documented. Counseling done  re healthy lifestyle involving commitment to 150 minutes exercise per week, heart healthy diet, and attaining healthy weight.The importance of adequate sleep also discussed. Immunization and cancer screening needs are specifically addressed at this visit.      Attention deficit disorder   On Modafinil  now Was on Focalin       Elevated serum creatinine   Last CMP reviewed Elevated S. Cr. and borderline low GFR at 56, could be due to lack of proper hydration Advised to increase fluid intake to at least 64 ounces in a day Recheck BMP later         No orders of the defined types were placed in this encounter.   Follow-up: Return in about 6 months (around 09/27/2024).    Suzzane MARLA Blanch, MD

## 2024-03-27 NOTE — Assessment & Plan Note (Signed)
 Last CMP reviewed Elevated S. Cr. and borderline low GFR at 56, could be due to lack of proper hydration Advised to increase fluid intake to at least 64 ounces in a day Recheck BMP later

## 2024-03-27 NOTE — Assessment & Plan Note (Signed)
 On Viibryd , Lamictal , Austedo , clozapine  Follows up with Psychiatry [facility] - 30/30

## 2024-03-27 NOTE — Assessment & Plan Note (Signed)
 Physical exam as documented. Counseling done  re healthy lifestyle involving commitment to 150 minutes exercise per week, heart healthy diet, and attaining healthy weight.The importance of adequate sleep also discussed. Immunization and cancer screening needs are specifically addressed at this visit.

## 2024-03-28 ENCOUNTER — Ambulatory Visit: Admitting: Psychiatry

## 2024-03-31 ENCOUNTER — Other Ambulatory Visit: Payer: Self-pay | Admitting: Psychiatry

## 2024-03-31 DIAGNOSIS — F411 Generalized anxiety disorder: Secondary | ICD-10-CM

## 2024-03-31 DIAGNOSIS — F3132 Bipolar disorder, current episode depressed, moderate: Secondary | ICD-10-CM

## 2024-04-08 ENCOUNTER — Ambulatory Visit (INDEPENDENT_AMBULATORY_CARE_PROVIDER_SITE_OTHER): Admitting: Psychiatry

## 2024-04-08 ENCOUNTER — Encounter: Payer: Self-pay | Admitting: Psychiatry

## 2024-04-08 DIAGNOSIS — F3132 Bipolar disorder, current episode depressed, moderate: Secondary | ICD-10-CM | POA: Diagnosis not present

## 2024-04-08 DIAGNOSIS — Z79899 Other long term (current) drug therapy: Secondary | ICD-10-CM

## 2024-04-08 DIAGNOSIS — G2401 Drug induced subacute dyskinesia: Secondary | ICD-10-CM | POA: Diagnosis not present

## 2024-04-08 DIAGNOSIS — F5105 Insomnia due to other mental disorder: Secondary | ICD-10-CM | POA: Diagnosis not present

## 2024-04-08 DIAGNOSIS — F411 Generalized anxiety disorder: Secondary | ICD-10-CM

## 2024-04-08 DIAGNOSIS — R7989 Other specified abnormal findings of blood chemistry: Secondary | ICD-10-CM

## 2024-04-08 DIAGNOSIS — G3184 Mild cognitive impairment, so stated: Secondary | ICD-10-CM | POA: Diagnosis not present

## 2024-04-08 DIAGNOSIS — F900 Attention-deficit hyperactivity disorder, predominantly inattentive type: Secondary | ICD-10-CM | POA: Diagnosis not present

## 2024-04-08 MED ORDER — VILAZODONE HCL 40 MG PO TABS: 40.0000 mg | ORAL_TABLET | Freq: Every day | ORAL | 0 refills | Status: DC

## 2024-04-08 MED ORDER — MODAFINIL 100 MG PO TABS
50.0000 mg | ORAL_TABLET | Freq: Every morning | ORAL | 0 refills | Status: DC
Start: 2024-04-08 — End: 2024-05-08

## 2024-04-08 MED ORDER — VILAZODONE HCL 20 MG PO TABS: ORAL_TABLET | ORAL | 0 refills | Status: DC

## 2024-04-08 MED ORDER — LAMOTRIGINE 100 MG PO TABS: ORAL_TABLET | ORAL | 0 refills | Status: DC

## 2024-04-08 NOTE — Progress Notes (Signed)
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 daily. 30 tablet 2   Ferrous Gluconate  (IRON  27 PO) Take by mouth.     ketoconazole  (NIZORAL ) 2 % cream Apply 1 Application topically daily. 60 g 0   Multiple Vitamin (MULTIVITAMIN ADULT PO) Take by mouth.     pantoprazole  (PROTONIX ) 40 MG tablet TAKE (1) TABLET BY MOUTH TWICE DAILY. 180 tablet 1   senna (SENOKOT)  8.6 MG tablet Take 1 tablet by mouth daily.     lamoTRIgine  (LAMICTAL ) 100 MG tablet Take 1 tablet (100 mg) in the am and take 2 tablets (200 mg) in the pm 270 tablet 0   modafinil  (PROVIGIL ) 100 MG tablet  Take 0.5 tablets (50 mg total) by mouth every morning. 45 tablet 0   Vilazodone  HCl (VIIBRYD ) 40 MG TABS Take 1 tablet (40 mg total) by mouth daily. 90 tablet 0   Vilazodone  HCl 20 MG TABS Take 1 tablet (20 mg) by mouth daily along with a 40 mg tablet. 90 tablet 0   No current facility-administered medications for this visit.    Medication Side Effects: Other: tremor and weight gain.  Dyskinesia appears better  SE bettter than they were.  Balance problems intermittently  Allergies:  Allergies  Allergen Reactions   Azithromycin Anaphylaxis   Penicillins Anaphylaxis    DID THE REACTION INVOLVE: Swelling of the face/tongue/throat, SOB, or low BP? Yes Sudden or severe rash/hives, skin peeling, or the inside of the mouth or nose? Yes Did it require medical treatment? No When did it last happen?       If all above answers are NO, may proceed with cephalosporin use.  Patient reacts to Z pack.  HAS Taken amoxicillin  fine.   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    Esophageal stricture    Fracture of superior pubic ramus (HCC) 11/28/2018   GERD (gastroesophageal reflux disease)    Headache(784.0)    Hyperlipidemia    Interstitial cystitis    Joint pain    Lactose intolerance    Lung cancer (HCC) 2002   Neuromuscular disorder (HCC)    Obesity    Osteoarthritis    Osteoporosis    Palpitations    Sleep apnea    Doesn't use a CPAP   Suicidal ideation 01/20/2020   Swallowing difficulty    Tardive dyskinesia     Family History  Problem Relation Age of Onset   Arthritis Mother    Hearing loss Mother     Hyperlipidemia Mother    Hypertension Mother    Depression Mother    Anxiety disorder Mother    Obesity Mother    Sudden death Mother    Hypertension Father    Diabetes Mellitus II Father    Heart disease Father    Arthritis Father    Cancer Father        Brain   COPD Father    Diabetes Father    Hyperlipidemia Father    Sleep apnea Father    Early death Sister        Aneroxia/Bulimic   Depression Brother    Early death Hydrographic surveyor Accident   Stroke Maternal Grandmother    Hypertension Maternal Grandmother    Arthritis Maternal Grandfather    Heart attack Maternal Grandfather    Hearing loss Maternal Grandfather    Depression Daughter  Drug abuse Daughter    Heart disease Daughter    Hypertension Daughter    Colon cancer Neg Hx    Esophageal cancer Neg Hx    Rectal cancer Neg Hx    Colon polyps Neg Hx    Stomach cancer Neg Hx     Social History   Socioeconomic History   Marital status: Married    Spouse name: Not on file   Number of children: 1   Years of education: 12   Highest education level: Not on file  Occupational History   Occupation: retired  Tobacco Use   Smoking status: Never   Smokeless tobacco: Never  Vaping Use   Vaping status: Never Used  Substance and Sexual Activity   Alcohol use: Not Currently    Alcohol/week: 1.0 standard drink of alcohol    Types: 1 Glasses of wine per week    Comment: 1 glass wine occassionally   Drug use: No   Sexual activity: Yes  Other Topics Concern   Not on file  Social History Narrative   Pt lives in [place] with husband Francis.  Followed by Dr. Geoffry for psychiatry and Marval Bunde for therapy.   Right handed   Drinks caffeine   One story home   Married lives with husband   retired   Chief Executive Officer Drivers of Corporate investment banker Strain: Low Risk  (06/01/2023)   Overall Financial Resource Strain (CARDIA)    Difficulty of Paying Living Expenses: Not hard at all  Food Insecurity: No Food  Insecurity (06/22/2023)   Hunger Vital Sign    Worried About Running Out of Food in the Last Year: Never true    Ran Out of Food in the Last Year: Never true  Transportation Needs: No Transportation Needs (06/22/2023)   PRAPARE - Administrator, Civil Service (Medical): No    Lack of Transportation (Non-Medical): No  Physical Activity: Inactive (06/01/2023)   Exercise Vital Sign    Days of Exercise per Week: 0 days    Minutes of Exercise per Session: 0 min  Stress: Stress Concern Present (06/01/2023)   [employer] of Occupational Health - Occupational Stress Questionnaire    Feeling of Stress : Very much  Social Connections: Socially Integrated (06/01/2023)   Social Connection and Isolation Panel    Frequency of Communication with Friends and Family: More than three times a week    Frequency of Social Gatherings with Friends and Family: More than three times a week    Attends Religious Services: More than 4 times per year    Active Member of [employer] or Organizations: Yes    Attends Banker Meetings: More than 4 times per year    Marital Status: Married  Catering manager Violence: Not At Risk (06/01/2023)   Humiliation, Afraid, Rape, and Kick questionnaire    Fear of Current or Ex-Partner: No    Emotionally Abused: No    Physically Abused: No    Sexually Abused: No    Past Medical History, Surgical history, Social history, and Family history were reviewed and updated as appropriate.   Please see review of systems for further details on the patient's review from today.   Objective:   Physical Exam:  There were no vitals taken for this visit.  Physical Exam Neurological:     Mental Status: She is alert and oriented to person, place, and time.     Cranial Nerves: No dysarthria.     Gait: Gait  normal.     Comments: Lip licking consistent, and some pursing unchanged Good gait  Psychiatric:        Attention and Perception: Attention and perception  normal.        Mood and Affect: Mood is anxious. Mood is not depressed. Affect is not tearful.        Speech: Speech normal.        Behavior: Behavior is not slowed. Behavior is cooperative.        Thought Content: Thought content normal. Thought content is not paranoid or delusional. Thought content does not include homicidal or suicidal ideation. Thought content does not include suicidal plan.        Cognition and Memory: Cognition and memory normal.        Judgment: Judgment normal.     Comments: Insight intact Ongoing residual dep, irritability, anxiety with dep  Affect stable less dep but ongoing anxiety bc  Highly stressed.     Lab Review:     Component Value Date/Time   NA 142 03/22/2024 0814   K 4.1 03/22/2024 0814   CL 104 03/22/2024 0814   CO2 22 03/22/2024 0814   GLUCOSE 99 03/22/2024 0814   GLUCOSE 101 (H) 08/02/2023 1214   BUN 14 03/22/2024 0814   CREATININE 1.08 (H) 03/22/2024 0814   CALCIUM  9.7 03/22/2024 0814   PROT 6.8 03/22/2024 0814   ALBUMIN 4.5 03/22/2024 0814   AST 27 03/22/2024 0814   ALT 17 03/22/2024 0814   ALKPHOS 71 03/22/2024 0814   BILITOT 0.5 03/22/2024 0814   GFRNONAA >60 08/02/2023 1214   GFRAA 96 03/02/2020 1433       Component Value Date/Time   WBC 6.9 03/22/2024 0814   WBC 8.0 08/02/2023 1214   RBC 4.54 03/22/2024 0814   RBC 4.29 08/02/2023 1214   HGB 13.6 03/22/2024 0814   HCT 42.0 03/22/2024 0814   PLT 220 03/22/2024 0814   MCV 93 03/22/2024 0814   MCH 30.0 03/22/2024 0814   MCH 30.8 08/02/2023 1214   MCHC 32.4 03/22/2024 0814   MCHC 33.2 08/02/2023 1214   RDW 12.7 03/22/2024 0814   LYMPHSABS 2.8 03/22/2024 0814   MONOABS 0.7 04/04/2019 1004   EOSABS 0.3 03/22/2024 0814   BASOSABS 0.0 03/22/2024 0814  Vitamin D  level 33 on 10K units daily on 12/4/`9 Increased to prescription vitamin d  50K units Monday, Wed, Friday.  Rx sent in.   Lithium  Lvl  Date Value Ref Range Status  10/21/2018 0.18 (L) 0.60 - 1.20 mmol/L Final     Comment:    Performed at [facility], [address], [place], [place] [address]     No results found for: PHENYTOIN, PHENOBARB, VALPROATE, CBMZ   .res Assessment: Plan:    Bipolar disorder with moderate depression (HCC) - Plan: Vilazodone  HCl (VIIBRYD ) 40 MG TABS, Vilazodone  HCl 20 MG TABS, modafinil  (PROVIGIL ) 100 MG tablet, lamoTRIgine  (LAMICTAL ) 100 MG tablet  Generalized anxiety disorder - Plan: Vilazodone  HCl (VIIBRYD ) 40 MG TABS, Vilazodone  HCl 20 MG TABS  Attention deficit hyperactivity disorder (ADHD), predominantly inattentive type - Plan: modafinil  (PROVIGIL ) 100 MG tablet  Tardive dyskinesia  Mild cognitive impairment  Insomnia due to mental condition  Long term current use of clozapine   Low vitamin D  level  30 min face to face time with patient.. We discussed multiple dxes and concerns.   Shaleena has chronic rapid cycling bipolar disorder which is chronically unstable and has been difficult to control.  Failed 14 different mood  stabilizers.  The rapid cycling is making it difficult to control frequency of depressive episodes and the anxiety as well.  We have typically had to make frequent med changes.   Disc gradual increase bc med sensitivity.  Med sensitivity to SE seems to be the biggest problem preventing better mood control SX remain TR  Disc options for better control of dep, irritability and anxiety.  Incr Viibryd  vs clozapine  If she can tolerate it the most effective option would be to increase the clozapine  which could help depression, anxiety and irritability.  But at this time she does not believe she can tolerate a higher dose of clozapine .    Ingrezza  for TD helped reduce tongue chewing but still some mouth movements at 60 mg BID  for lip licking and pursing.  Shuffling only at night now.  Drooling resolved Stopped ingrezza  DT insurance won't cover BID anymore. Austedo  XR 48 mg daily.  Still complaining of tongue and lip movements but not grinding or  biting tongue now. Disc ECT. Only FDA approved option left. She wants to defer.    Extensive discussion of clozapine  dosing recommendations but we will increase more slowly bc she is so med sensitive.Disc risk low WBC, cardiomyopathy, etc, sedation  (Started clozapine  on 12/07/21).    Check CBC with diff every 4 weeks.  Now.  Option increase clozapine  for anxiety.  Disc SR risk.   For anxiety and irritability, reactivity , mood swings continue clozapine  but switch to 75 mg HS.  She coouldn't tolerate higher dose and so mood swings are worse.  Lately dealing with dep and anxiety but better the last couple of days. Likely to work better than incr Viibryd  but consider latter.  She has a high residual anxiety.  It has been impossible to control all of her symptoms simultaneously without causing side effects. Failed various meds.  Discussed side effects of each medicine. Bruxism 100% better with switch from Focalin  to modafinil  50 AM.  So continue it. Discussed potential benefits, risks, and side effects of stimulants with patient to include increased heart rate, palpitations, insomnia, increased anxiety, increased irritability, or decreased appetite.  Instructed patient to contact office if experiencing any significant tolerability issues. Consider modafinil  if necessary  Would like to avoid BZ if possible.  Lamotrigine  100 mg Am and 200 mg pm.    Failed all reasonable alternatives for anxiety.  Option Auvelity.  She is under a lot of genuine stress.  Discussed potential metabolic side effects associated with atypical antipsychotics, as well as potential risk for movement side effects. Advised pt to contact office if movement side effects occur.    Checked B12 folate bc memory complaints.  Normal B12 & folate on 05/25/22  Disc SE meds and this is heightened by the complication of necessary polypharmacy.  Counseling: on stress management, isolation, dealing with H's drinking.  And dealing with  his cancer.  He has prostate CA with mets.  Continue counseling . It helps  Requires frequent FU DT chronic instability.  Wants to schedule monthly.  Continue Viibryd  60 mg daily No med changes  FU 4 weeks.   Please see After Visit Summary for patient specific instructions.  Lorene Macintosh, MD, DFAPA     Future Appointments  Date Time Provider Department Center  04/10/2024  9:30 AM Konnie Graydon RAMAN, PT [facility]  04/11/2024  9:00 AM Sherlynn Sober, LCSW [facility] None  04/25/2024 10:00 AM Sherlynn Sober, LCSW [facility] None  05/08/2024 10:00 AM Cottle, Lorene KANDICE Raddle.,  MD [facility] None  05/09/2024 10:00 AM Sherlynn Sober, LCSW [facility] None  05/23/2024 10:00 AM Sherlynn Sober, LCSW [facility] None  06/05/2024 10:40 AM [facility]-ANNUAL WELLNESS VISIT [facility] R[facility]  06/06/2024  1:30 PM Cottle, Lorene KANDICE Raddle., MD [facility] None  06/13/2024 10:00 AM Sherlynn Sober, LCSW [facility] None  07/10/2024  1:30 PM Cottle, Lorene KANDICE Raddle., MD [facility] None  08/13/2024 10:30 AM Cottle, Lorene KANDICE Raddle., MD [facility] None  10/01/2024  3:00 PM Tobie Suzzane POUR, MD [facility]    No orders of the defined types were placed in this encounter.      -------------------------------

## 2024-04-10 ENCOUNTER — Ambulatory Visit: Admitting: Physical Therapy

## 2024-04-10 ENCOUNTER — Encounter: Payer: Self-pay | Admitting: Internal Medicine

## 2024-04-10 ENCOUNTER — Telehealth: Payer: Self-pay | Admitting: Physical Therapy

## 2024-04-11 ENCOUNTER — Ambulatory Visit: Admitting: Psychiatry

## 2024-04-16 ENCOUNTER — Ambulatory Visit: Admitting: Psychiatry

## 2024-04-16 DIAGNOSIS — F3132 Bipolar disorder, current episode depressed, moderate: Secondary | ICD-10-CM

## 2024-04-16 NOTE — Progress Notes (Signed)
 [facility] Counselor/Therapist Progress Note  Patient ID: Megan Whitney, MRN: [id number],    Date: 04/16/2024  Time Spent: 50 minutes   Treatment Type: Individual Therapy  Reported Symptoms:  bipolar and mania, anxiety, depression, no thoughts to harm self nor others    Mental Status Exam:  Appearance:   Casual and Neat     Behavior:  Appropriate and Sharing  Motor:  Normal  Speech/Language:   Clear and Coherent  Affect:  Depressed and anxious  Mood:  anxious and depressed  Thought process:  goal directed  Thought content:    Rumination  Sensory/Perceptual disturbances:    WNL  Orientation:  oriented to person, place, time/date, situation, day of week, month of year, year, and stated date of Aug. 12, 2025  Attention:  Fair  Concentration:  Fair  Memory:  WNL  Fund of knowledge:   Good and Fair  Insight:    Good and Fair  Judgment:   Fair  Impulse Control:  Fair   Risk Assessment: Danger to Self:  No Self-injurious Behavior: No Danger to Others: No Duty to Warn:no Physical Aggression / Violence:No  Access to Firearms a concern: No  Gang Involvement:No   Subjective: Patient today reporting hurt from grand-daughter with whom it's been difficult to have much of a relationship. Shared and processed some of her hurt and disappointment. Does plan to talk with daughter-in-law about an issue with grand-daughter and their relationship. Patient close to daughter-in-law and feels comfortable talking to her. Continues working today on her anxiety, depression, bipolar, irritability, some anger, and feeling overwhelmed. Did well in talking through her feelings, fears, apprehension. Thinking about getting a medical alert button for Francis, her husband with cancer. States husband continues drinking which is upsetting for patient and she is able to talk through more concerns, frustrations, and fears in session today.  Hurt by husband's comments.  Acknowledges some of her own  improvement as she copes with her husband's cancer and her own struggles with with mental health issues.  Thankful that she is able to see some strength even when she struggles with the sadness she feels.  Remaining in good contact with friends which is helpful.  Plans to speak with her minister at some point and was going to arrange this earlier but Francis went back into the hospital and patient was managing a lot at that time.  Is trying to be more encouraging and her self talk, trying to exercise healthy boundaries, remain on her medication, practice more positive self talk, and recognize the strength she shows as she gets through each day.  Supportive friendships are helping a lot.   Interventions: Cognitive Behavioral Therapy, Solution-Oriented/Positive Psychology, and Ego-Supportive Long Term Goal: Reduce overall level, frequency, and intensity of the anxiety so that daily functioning is not impaired. Short Term Goal: 1.Increase understanding of the beliefs and messages that produce the worry and anxiety. Strategies: 1.Help client develop reality-based positive cognitive messages/self-talk. 2. Develop a coping card or other reminder which coping strategies are recorded for patient's later use  Diagnosis:   ICD-10-CM   1. Bipolar disorder with moderate depression (HCC)  F31.32      Plan: Patient working well in session today on her anxiety, depression, fears, and frustrations mostly related to husband's continued drinking and his significant health issues as he declines.  Frustrated as she feels he does not recognize how his behavior hurts others that care about him.  Patient reporting less guilt.  Easy  to get overwhelmed at times but finding getting more rest and being in touch with friends is helpful. Reminded and encouraged patient and her work with treatment strategies and letting others be supportive of her, use of good judgment, making decisions and thinking them through rather than  being impulsive in her decision making, refraining from self sabotage, refraining from assuming worst-case scenarios, seeing her strengths and the positives within herself, engaging frequently with her 2 dogs who are very therapeutic for her, and recognizing the strengths she can show when working with goal-directed behaviors trying to move in a direction that supports and helps her improve her overall physical and emotional health in addition to her outlook into the future.  Breiona Couvillon has continued to make progress and needs to keep working with her goal-directed behaviors to build on the progress she has already made while trying to keep moving forward in a more hopeful and healthier direction for herself into the future.  Goal review and progress/challenges noted with patient.  Next appointment within 2 weeks.   Barnie Bunde, LCSW

## 2024-04-20 ENCOUNTER — Encounter: Payer: Self-pay | Admitting: Internal Medicine

## 2024-04-21 ENCOUNTER — Encounter: Payer: Self-pay | Admitting: Internal Medicine

## 2024-04-25 ENCOUNTER — Ambulatory Visit: Admitting: Psychiatry

## 2024-04-27 ENCOUNTER — Other Ambulatory Visit: Payer: Self-pay | Admitting: Internal Medicine

## 2024-04-27 DIAGNOSIS — K219 Gastro-esophageal reflux disease without esophagitis: Secondary | ICD-10-CM

## 2024-04-29 ENCOUNTER — Ambulatory Visit: Admitting: Psychiatry

## 2024-04-29 DIAGNOSIS — F3132 Bipolar disorder, current episode depressed, moderate: Secondary | ICD-10-CM

## 2024-04-29 NOTE — Progress Notes (Signed)
 [facility] Counselor/Therapist Progress Note  Patient ID: Megan Whitney, MRN: [id number],    Date: 04/29/2024  Time Spent: 55 minutes   Treatment Type: Individual Therapy  Reported Symptoms: Patient reporting some recent bipolar and some mania (closely linked with husband's illness), anxiety, depression, no thoughts to harm self nor others    Mental Status Exam:  Appearance:   Casual and Neat     Behavior:  Appropriate, Sharing, and Motivated  Motor:  Normal  Speech/Language:   Clear and Coherent  Affect:  Depressed and anxious  Mood:  anxious  Thought process:  goal directed  Thought content:    Rumination  Sensory/Perceptual disturbances:    WNL  Orientation:  oriented to person, place, time/date, situation, day of week, month of year, year, and stated date of Aug. 25, 2025  Attention:  Good  Concentration:  Good and Fair  Memory:  Fair  Fund of knowledge:   Good and Fair  Insight:    Good  Judgment:   Good  Impulse Control:  Fair   Risk Assessment: Danger to Self:  No Self-injurious Behavior: No Danger to Others: No Duty to Warn:no Physical Aggression / Violence:No  Access to Firearms a concern: No  Gang Involvement:No   Subjective: Patient in session today reporting some bipolar and mania, anxiety, depression, no thoughts to harm self nor others. Anxiety sometimes comes out as anger.  Husband remains homebound with his cancer. I have a lot of anxiety about his cancer and my relationship with young grand-daughter. Crying some at times. Does have some friends that are supportive.Trying not to be negative in my outlook. Did order medical alarm button but is wanting to send it back and get refund. Reports she is better about taking a few breaks as needed.  Today workin further on her hurt and disappointment. Processing some of her anticipatory grief, and acknowledges it but also states but I won't miss the alcohol and his drinking. Shared more issues  re: relationship with grand-daughter and plans to talk with the mother soon. (Not all details included in note due to patient privacy needs ).  Patient does continue to work on her anxiety, some irritability, anger, bipolar, depression, and feeling overwhelmed although the overwhelmingness is not quite as bad today.  Did really well in session in talking through her concerns, fears, and uncertainties particularly about her husband's health.  Able to see some of her improvement as she has worked hard to cope with her own mental health struggles and also her husband's cancer.  Is doing some positive things including remaining in good contact with her friends which is very helpful for her.  States that she plans to speak with her minister at some point and has not had a good opportunity recently.  Working on maintaining her healthy boundaries, staying on her medication as prescribed, use of encouraging self talk, and recognizing the strength she shows as she works through Copywriter, advertising and family challenges.  Does have a few supportive friendships which are very helpful for patient.  Interventions: Cognitive Behavioral Therapy, Solution-Oriented/Positive Psychology, and Insight-Oriented Long Term Goal: Reduce overall level, frequency, and intensity of the anxiety so that daily functioning is not impaired. Short Term Goal: 1.Increase understanding of the beliefs and messages that produce the worry and anxiety. Strategies: 1.Help client develop reality-based positive cognitive messages/self-talk. 2. Develop a coping card or other reminder which coping strategies are recorded for patient's later use   Diagnosis:   ICD-10-CM  1. Bipolar disorder with moderate depression (HCC)  F31.32      Plan: Patient working well in session today, even with her bipolar , depression, anxiety, fearful thoughts, and frustrations all related to her husband's health status and his going AGAINST MEDICAL ADVICE and continuing  to abuse alcohol.  Patient continues to feel that husband ignores the fact that she and others are hurt by husband and his words and actions.  Continues to experience frustration in dealing with husband as she feels that he does not show concern or care for her and his behavior and words at times can be very hurtful, not physically but emotionally.  Patient reports feeling less guilt but easily overwhelmed at times depending on husband's behavior and words.  Continue to encourage patient in letting others be supportive of her, using good judgment, making decisions and thinking them through rather than being impulsive in her decision making, refraining from self sabotage, seeing her strengths and her positives more quickly, engaging frequently with her 2 dogs that are very therapeutic for her, and recognize the strength she can show when working with goal-directed behaviors trying to move in a direction that supports and helps her overall physical and emotional health, and her outlook going forward.  Megan Whitney has made progress and needs to keep up her work with goal-directed behaviors, building on the progress she has already made while trying to keep moving in a more hopeful and healthier direction going forward.  Goal review and progress/challenges noted with patient.  Next appointment within 2 weeks.   Barnie Bunde, LCSW

## 2024-05-01 ENCOUNTER — Other Ambulatory Visit: Payer: Self-pay | Admitting: Psychiatry

## 2024-05-01 DIAGNOSIS — F319 Bipolar disorder, unspecified: Secondary | ICD-10-CM | POA: Diagnosis not present

## 2024-05-01 DIAGNOSIS — Z79899 Other long term (current) drug therapy: Secondary | ICD-10-CM | POA: Diagnosis not present

## 2024-05-02 ENCOUNTER — Encounter: Payer: Self-pay | Admitting: Psychiatry

## 2024-05-02 ENCOUNTER — Telehealth: Payer: Self-pay

## 2024-05-02 NOTE — Telephone Encounter (Signed)
 PA Austedo  XR 48 mg approved 05/02/24-04/29/25 [employer] (800) [phone] p.

## 2024-05-08 ENCOUNTER — Encounter: Payer: Self-pay | Admitting: Psychiatry

## 2024-05-08 ENCOUNTER — Ambulatory Visit (INDEPENDENT_AMBULATORY_CARE_PROVIDER_SITE_OTHER): Admitting: Psychiatry

## 2024-05-08 DIAGNOSIS — R7989 Other specified abnormal findings of blood chemistry: Secondary | ICD-10-CM | POA: Diagnosis not present

## 2024-05-08 DIAGNOSIS — G3184 Mild cognitive impairment, so stated: Secondary | ICD-10-CM | POA: Diagnosis not present

## 2024-05-08 DIAGNOSIS — F5105 Insomnia due to other mental disorder: Secondary | ICD-10-CM

## 2024-05-08 DIAGNOSIS — F900 Attention-deficit hyperactivity disorder, predominantly inattentive type: Secondary | ICD-10-CM

## 2024-05-08 DIAGNOSIS — Z636 Dependent relative needing care at home: Secondary | ICD-10-CM | POA: Diagnosis not present

## 2024-05-08 DIAGNOSIS — Z79899 Other long term (current) drug therapy: Secondary | ICD-10-CM

## 2024-05-08 DIAGNOSIS — G2401 Drug induced subacute dyskinesia: Secondary | ICD-10-CM

## 2024-05-08 DIAGNOSIS — F411 Generalized anxiety disorder: Secondary | ICD-10-CM | POA: Diagnosis not present

## 2024-05-08 DIAGNOSIS — F3132 Bipolar disorder, current episode depressed, moderate: Secondary | ICD-10-CM

## 2024-05-08 MED ORDER — CLOZAPINE 25 MG PO TABS: 75.0000 mg | ORAL_TABLET | Freq: Every day | ORAL | 2 refills | Status: DC

## 2024-05-08 MED ORDER — MODAFINIL 100 MG PO TABS: 50.0000 mg | ORAL_TABLET | Freq: Every morning | ORAL | Status: DC

## 2024-05-08 NOTE — Progress Notes (Signed)
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  (LIPITOR) 20 MG tablet TAKE 1 TABLET EACH EVENING. 90 tablet 3   AUSTEDO  XR 48 MG TB24 TAKE 1 TABLET BY MOUTH EVERY DAY 30 tablet 2   cloZAPine  (CLOZARIL ) 25 MG tablet Take 3 tablets (75 mg total) by mouth at bedtime. 90 tablet 2   Ferrous Gluconate  (IRON  27 PO) Take by mouth.     ketoconazole  (NIZORAL ) 2 % cream Apply  1 Application topically daily. 60 g 0   lamoTRIgine  (LAMICTAL ) 100 MG tablet Take 1 tablet (100 mg) in the am and take 2 tablets (200 mg) in the pm 270 tablet 0   modafinil  (PROVIGIL ) 100 MG tablet Take 0.5 tablets (50 mg total) by mouth every  morning. 45 tablet 0   Multiple Vitamin (MULTIVITAMIN ADULT PO) Take by mouth.     pantoprazole  (PROTONIX ) 40 MG tablet Take 1 tablet (40 mg total) by mouth 2 (two) times daily. Office visit for further refills 180 tablet 0   senna (SENOKOT) 8.6 MG tablet Take 1 tablet by mouth daily.     Vilazodone  HCl (VIIBRYD ) 40 MG TABS Take 1 tablet (40 mg total) by mouth daily. 90 tablet 0   Vilazodone  HCl 20 MG TABS Take 1 tablet (20 mg) by mouth daily along with a 40 mg tablet. 90 tablet 0   No current facility-administered medications for this visit.    Medication Side Effects: Other: tremor and weight gain.  Dyskinesia appears better  SE bettter than they were.  Balance problems intermittently  Allergies:  Allergies  Allergen Reactions   Azithromycin Anaphylaxis   Penicillins Anaphylaxis    DID THE REACTION INVOLVE: Swelling of the face/tongue/throat, SOB, or low BP? Yes Sudden or severe rash/hives, skin peeling, or the inside of the mouth or nose? Yes Did it require medical treatment? No When did it last happen?       If all above answers are NO, may proceed with cephalosporin use.  Patient reacts to Z pack.  HAS Taken amoxicillin  fine.   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    Esophageal stricture    Fracture of superior pubic ramus (HCC) 11/28/2018   GERD (gastroesophageal reflux disease)    Headache(784.0)    Hyperlipidemia    Interstitial cystitis    Joint pain    Lactose intolerance    Lung cancer (HCC) 2002   Neuromuscular disorder (HCC)    Obesity    Osteoarthritis    Osteoporosis    Palpitations     Sleep apnea    Doesn't use a CPAP   Suicidal ideation 01/20/2020   Swallowing difficulty    Tardive dyskinesia     Family History  Problem Relation Age of Onset   Arthritis Mother    Hearing loss Mother    Hyperlipidemia Mother    Hypertension Mother    Depression Mother    Anxiety disorder Mother    Obesity Mother    Sudden death Mother    Hypertension Father    Diabetes Mellitus II Father    Heart disease Father    Arthritis Father    Cancer Father        Brain   COPD Father    Diabetes Father    Hyperlipidemia Father    Sleep apnea Father    Early death Sister        Aneroxia/Bulimic   Depression Brother  Early death Hydrographic surveyor Accident   Stroke Maternal Grandmother    Hypertension Maternal Grandmother    Arthritis Maternal Grandfather    Heart attack Maternal Grandfather    Hearing loss Maternal Grandfather    Depression Daughter    Drug abuse Daughter    Heart disease Daughter    Hypertension Daughter    Colon cancer Neg Hx    Esophageal cancer Neg Hx    Rectal cancer Neg Hx    Colon polyps Neg Hx    Stomach cancer Neg Hx     Social History   Socioeconomic History   Marital status: Married    Spouse name: Not on file   Number of children: 1   Years of education: 12   Highest education level: Not on file  Occupational History   Occupation: retired  Tobacco Use   Smoking status: Never   Smokeless tobacco: Never  Vaping Use   Vaping status: Never Used  Substance and Sexual Activity   Alcohol use: Not Currently    Alcohol/week: 1.0 standard drink of alcohol    Types: 1 Glasses of wine per week    Comment: 1 glass wine occassionally   Drug use: No   Sexual activity: Yes  Other Topics Concern   Not on file  Social History Narrative   Pt lives in [place] with husband Francis.  Followed by Dr. Geoffry for psychiatry and Marval Bunde for therapy.   Right handed   Drinks caffeine   One story home   Married lives with husband    retired   Chief Executive Officer Drivers of Corporate investment banker Strain: Low Risk  (06/01/2023)   Overall Financial Resource Strain (CARDIA)    Difficulty of Paying Living Expenses: Not hard at all  Food Insecurity: No Food Insecurity (06/22/2023)   Hunger Vital Sign    Worried About Running Out of Food in the Last Year: Never true    Ran Out of Food in the Last Year: Never true  Transportation Needs: No Transportation Needs (06/22/2023)   PRAPARE - Administrator, Civil Service (Medical): No    Lack of Transportation (Non-Medical): No  Physical Activity: Inactive (06/01/2023)   Exercise Vital Sign    Days of Exercise per Week: 0 days    Minutes of Exercise per Session: 0 min  Stress: Stress Concern Present (06/01/2023)   [employer] of Occupational Health - Occupational Stress Questionnaire    Feeling of Stress : Very much  Social Connections: Socially Integrated (06/01/2023)   Social Connection and Isolation Panel    Frequency of Communication with Friends and Family: More than three times a week    Frequency of Social Gatherings with Friends and Family: More than three times a week    Attends Religious Services: More than 4 times per year    Active Member of [employer] or Organizations: Yes    Attends Banker Meetings: More than 4 times per year    Marital Status: Married  Catering manager Violence: Not At Risk (06/01/2023)   Humiliation, Afraid, Rape, and Kick questionnaire    Fear of Current or Ex-Partner: No    Emotionally Abused: No    Physically Abused: No    Sexually Abused: No    Past Medical History, Surgical history, Social history, and Family history were reviewed and updated as appropriate.   Please see review of systems for further details on the patient's review from  today.   Objective:   Physical Exam:  There were no vitals taken for this visit.  Physical Exam Neurological:     Mental Status: She is alert and oriented to person, place, and  time.     Cranial Nerves: No dysarthria.     Gait: Gait normal.     Comments: Lip licking consistent, and some pursing now minimal Good gait  Psychiatric:        Attention and Perception: Attention and perception normal.        Mood and Affect: Mood is anxious. Mood is not depressed. Affect is not tearful.        Speech: Speech normal.        Behavior: Behavior is not slowed. Behavior is cooperative.        Thought Content: Thought content normal. Thought content is not paranoid or delusional. Thought content does not include homicidal or suicidal ideation. Thought content does not include suicidal plan.        Cognition and Memory: Cognition and memory normal.        Judgment: Judgment normal.     Comments: Insight intact Ongoing residual dep, irritability, anxiety with dep  Affect stable less dep but ongoing anxiety bc  Highly stressed.     Lab Review:     Component Value Date/Time   NA 142 03/22/2024 0814   K 4.1 03/22/2024 0814   CL 104 03/22/2024 0814   CO2 22 03/22/2024 0814   GLUCOSE 99 03/22/2024 0814   GLUCOSE 101 (H) 08/02/2023 1214   BUN 14 03/22/2024 0814   CREATININE 1.08 (H) 03/22/2024 0814   CALCIUM  9.7 03/22/2024 0814   PROT 6.8 03/22/2024 0814   ALBUMIN 4.5 03/22/2024 0814   AST 27 03/22/2024 0814   ALT 17 03/22/2024 0814   ALKPHOS 71 03/22/2024 0814   BILITOT 0.5 03/22/2024 0814   GFRNONAA >60 08/02/2023 1214   GFRAA 96 03/02/2020 1433       Component Value Date/Time   WBC 6.9 03/22/2024 0814   WBC 8.0 08/02/2023 1214   RBC 4.54 03/22/2024 0814   RBC 4.29 08/02/2023 1214   HGB 13.6 03/22/2024 0814   HCT 42.0 03/22/2024 0814   PLT 220 03/22/2024 0814   MCV 93 03/22/2024 0814   MCH 30.0 03/22/2024 0814   MCH 30.8 08/02/2023 1214   MCHC 32.4 03/22/2024 0814   MCHC 33.2 08/02/2023 1214   RDW 12.7 03/22/2024 0814   LYMPHSABS 2.8 03/22/2024 0814   MONOABS 0.7 04/04/2019 1004   EOSABS 0.3 03/22/2024 0814   BASOSABS 0.0 03/22/2024 0814  Vitamin D   level 33 on 10K units daily on 12/4/`9 Increased to prescription vitamin d  50K units Monday, Wed, Friday.  Rx sent in.   Lithium  Lvl  Date Value Ref Range Status  10/21/2018 0.18 (L) 0.60 - 1.20 mmol/L Final    Comment:    Performed at [facility], [address], [place], [place] [address]     No results found for: PHENYTOIN, PHENOBARB, VALPROATE, CBMZ   .res Assessment: Plan:    No diagnosis found.  30 min face to face time with patient.. We discussed multiple dxes and concerns.   Taneshia has chronic rapid cycling bipolar disorder which is chronically unstable and has been difficult to control.  Failed 14 different mood stabilizers.  The rapid cycling is making it difficult to control frequency of depressive episodes and the anxiety as well.  We have typically had to make frequent med changes.  Disc gradual increase bc med sensitivity.  Med sensitivity to SE seems to be the biggest problem preventing better mood control SX remain TR  Disc options for better control of dep, irritability and anxiety.  Incr Viibryd  vs clozapine  If she can tolerate it the most effective option would be to increase the clozapine  which could help depression, anxiety and irritability.  But at this time she does not believe she can tolerate a higher dose of clozapine .    Ingrezza  for TD helped reduce tongue chewing but still some mouth movements at 60 mg BID  for lip licking and pursing.  Shuffling only at night now.  Drooling resolved Stopped ingrezza  DT insurance won't cover BID anymore.  Austedo  XR 48 mg daily.  Still complaining of tongue and lip movements but not grinding or biting tongue now.  Training and development officer.  Further improvement to minimal amount.  Disc ECT. Only FDA approved option left. She wants to defer.    Extensive discussion of clozapine  dosing recommendations but we will increase more slowly bc she is so med sensitive.Disc risk low WBC, cardiomyopathy, etc, sedation  (Started  clozapine  on 12/07/21).    ON THIS OVER 2 YEARS.  PER fda CAN NOW STOP LABS EXCEPT AS NEEDED. For anxiety and irritability, reactivity , mood swings continue clozapine  but switch to 75 mg HS.  She coouldn't tolerate higher dose and so mood swings are worse.  Lately dealing with dep and anxiety but better the last couple of days. Likely to work better than incr Viibryd  but consider latter.  She has a high residual anxiety.  It has been impossible to control all of her symptoms simultaneously without causing side effects. Failed various meds.  Discussed side effects of each medicine. Bruxism 100% better with switch from Focalin  to modafinil  50 AM.  Discussed potential benefits, risks, and side effects of stimulants with patient to include increased heart rate, palpitations, insomnia, increased anxiety, increased irritability, or decreased appetite.  Instructed patient to contact office if experiencing any significant tolerability issues. Consider modafinil  if necessary howver now wonders if modafinil  is causing a stutter bc didn't notice it before. So hold modafinil  to see if stutter resolves.  Would like to avoid BZ if possible.  Lamotrigine  100 mg Am and 200 mg pm.    Failed all reasonable alternatives for anxiety.  Option Auvelity.  She is under a lot of genuine stress.  Discussed potential metabolic side effects associated with atypical antipsychotics, as well as potential risk for movement side effects. Advised pt to contact office if movement side effects occur.    Checked B12 folate bc memory complaints.  Normal B12 & folate on 05/25/22  Disc SE meds and this is heightened by the complication of necessary polypharmacy.  Counseling: on stress management, isolation, dealing with H's drinking.  And dealing with his cancer.  He has prostate CA with mets.  Continue counseling . It helps.  Living situation is getting worse as H's health going downhill fast.  He's falling more and making bad  decisions.  Trying to get H into CA study.   Requires frequent FU DT chronic instability.  Wants to schedule monthly.  Continue Viibryd  60 mg daily hOLD modafinil  until RO cause of stutter.  FU 4 weeks.   Please see After Visit Summary for patient specific instructions.  Lorene Macintosh, MD, DFAPA     Future Appointments  Date Time Provider Department Center  05/09/2024 10:00 AM Sherlynn Sober, LCSW [facility] None  05/23/2024 10:00 AM  Sherlynn Sober, LCSW [facility] None  06/05/2024 10:40 AM [facility]-ANNUAL WELLNESS VISIT [facility] [address]  06/06/2024  1:30 PM Cottle, Lorene KANDICE Raddle., MD [facility] None  06/13/2024 10:00 AM Sherlynn Sober, LCSW [facility] None  06/27/2024 10:00 AM Sherlynn Sober, LCSW [facility] None  07/10/2024  1:30 PM Cottle, Lorene KANDICE Raddle., MD [facility] None  07/11/2024 10:00 AM Sherlynn Sober, LCSW [facility] None  08/13/2024 10:30 AM Cottle, Lorene KANDICE Raddle., MD [facility] None  09/12/2024 10:00 AM Cottle, Lorene KANDICE Raddle., MD [facility] None  10/01/2024  3:00 PM Tobie Suzzane POUR, MD [facility] [address]    No orders of the defined types were placed in this encounter.      -------------------------------

## 2024-05-08 NOTE — Patient Instructions (Signed)
 Hold the modafinil  for 4 days to see if it resolves the stutter.

## 2024-05-09 ENCOUNTER — Ambulatory Visit (INDEPENDENT_AMBULATORY_CARE_PROVIDER_SITE_OTHER): Admitting: Psychiatry

## 2024-05-09 DIAGNOSIS — F3132 Bipolar disorder, current episode depressed, moderate: Secondary | ICD-10-CM

## 2024-05-09 NOTE — Progress Notes (Signed)
 [facility] Counselor/Therapist Progress Note  Patient ID: Megan Whitney, MRN: [id number],    Date: 05/09/2024  Time Spent: 53 minutes   Treatment Type: Individual Therapy  Reported Symptoms:  bipolar, anxiety, depression, denies thoughts to harm self/others     Mental Status Exam:  Appearance:   Casual     Behavior:  Appropriate, Sharing, and Motivated  Motor:  Normal  Speech/Language:   Clear and Coherent  Affect:  Depressed and anxious  Mood:  anxious and depressed  Thought process:  goal directed  Thought content:    Rumination  Sensory/Perceptual disturbances:    WNL  Orientation:  oriented to person, place, time/date, situation, day of week, month of year, year, and stated date of Sept. 4, 2025  Attention:  Fair  Concentration:  Fair  Memory:  WNL  Fund of knowledge:   Good  Insight:    Good and Fair  Judgment:   Good and Fair  Impulse Control:  Fair and Poor   Risk Assessment: Danger to Self:  No Self-injurious Behavior: No Danger to Others: No Duty to Warn:no Physical Aggression / Violence:No  Access to Firearms a concern: No  Gang Involvement:No   Subjective: Patient today needing the time to process anxiety, anticipatory grief, and fears relating to husband's cancer and his making unhealthy, poor decisions.  His ongoing alcohol issues do concern patient as she shares more on this today. Husband volatile and at times patient feels there's not much she can do to help him change (but wishes she could) and I walk on eggshells a lot.  Some bipolar and mania recently, although not presenting that way in session today. Today more depressed and anxious, some anger re: husband's behavior and choices. Working on relationship with young grand-daughter as she would like for them to be closer. Trying not to be negative towards husband. Processing fears and anxieties and letting others be supportive of her. Working to strengthen her boundaries, see her own strengths,  working on how she talks to herself and treats herself and trying to be healthier emotionally and physically. Some supportive friendships. Seeking out ways to be connected with others as she has been encouraged in sessions.  Interventions: Cognitive Behavioral Therapy, Solution-Oriented/Positive Psychology, and Ego-Supportive Long Term Goal: Reduce overall level, frequency, and intensity of the anxiety so that daily functioning is not impaired. Short Term Goal: 1.Increase understanding of the beliefs and messages that produce the worry and anxiety. Strategies: 1.Help client develop reality-based positive cognitive messages/self-talk. 2. Develop a coping card or other reminder which coping strategies are recorded for patient's later use   Diagnosis:   ICD-10-CM   1. Bipolar disorder with moderate depression (HCC)  F31.32      Plan: Patient today in session showing good motivation, good interaction, although still struggling with some bipolar and depression.  Still having some fearful thoughts and anxiety and depression particularly about husband's health situation and his mental health situation and alcoholism.  Shared and described several recent instances with him being quite angry that she does not support his negative behaviors nor his drinking.  Patient states that she is not particularly fearful of him and that if she ever were she would get outside the house and go to a neighbors home.  Continues to be hurt by husband's words and actions, however, today she has shown more strength and determination and trying to take better care of herself and practice more helpful self-talk, in addition to staying in touch  with a few supportive friends and family.  Granddaughter and her parents are coming to visit soon and patient is looking forward to that as she has a new board game for them to play.  Encouraged patient in her trying to set healthier limits, and also more importantly her continuing to  increase/improve her own self-care.  Patient does report less guilt.  Reminded and encouraged patient in her letting others to be of support to her, using good judgment, making decisions and thinking them through rather than being impulsive in her decision making, refraining from self sabotage, seeing her strengths and her positives more quickly, engaging frequently with her 2 dogs that are very therapeutic for her, and recognize the strengths she shows when working with goal-directed behaviors trying to move in a direction that supports and helps her overall physical and emotional health, and her outlook going forward.  Megan Whitney has made progress and needs to continue her work with goal-directed behaviors as she builds on her progress already made and wants to keep moving and a healthier and much more hopeful direction going forward.  Goal review and progress/challenges noted with patient.  Next appointment within 2 weeks.   Barnie Bunde, LCSW

## 2024-05-23 ENCOUNTER — Ambulatory Visit: Admitting: Psychiatry

## 2024-05-23 DIAGNOSIS — F3132 Bipolar disorder, current episode depressed, moderate: Secondary | ICD-10-CM | POA: Diagnosis not present

## 2024-05-23 NOTE — Progress Notes (Signed)
 [facility] Counselor/Therapist Progress Note  Patient ID: Megan Whitney, MRN: [id number],    Date: 05/23/2024  Time Spent: 55 minutes   Treatment Type: Individual Therapy  Reported Symptoms: bipolar, anxiety, depression, some feeling sorry for myself but it doesn't last long, denies any SI    Mental Status Exam:  Appearance:   Casual and Neat     Behavior:  Appropriate, Sharing, and Motivated  Motor:  Normal  Speech/Language:   Clear and Coherent  Affect:  Depressed anxious  Mood:  anxious and depressed  Thought process:  goal directed  Thought content:    Rumination  Sensory/Perceptual disturbances:    WNL  Orientation:  oriented to person, place, time/date, situation, day of week, month of year, year, and stated date of Sept. 18, 2025  Attention:  Fair  Concentration:  Good and Fair  Memory:  WNL  Fund of knowledge:   Good  Insight:    Good and Fair  Judgment:   Good and Fair  Impulse Control:  Fair   Risk Assessment: Danger to Self:  No Self-injurious Behavior: No Danger to Others: No Duty to Warn:no Physical Aggression / Violence:No  Access to Firearms a concern: No  Gang Involvement:No   Subjective:   Patient today in session working further on her anxiety, some mania last week and over-bought some items at store that were not necessary, and I couldn't return the items. Reports no current mania and states I know I am some better and not manic but still struggle. I do tend to avoid things and I just get further behind. Worked on her being more open in working on her tasks at home, seeing her strengths, and refraining from beating herself up emotionally on what she sees as her negatives. Husband still controlling in multiple ways and that is another issue for patient to try and deal with. Continue to walk on eggshells with husband.  Continued processing on anxiety, anger, frustration which seems to help patient gain strength and believe more in herself.  Strengthening her boundaries. Some improvement in self-esteem.    Interventions: Cognitive Behavioral Therapy, Solution-Oriented/Positive Psychology, and Ego-Supportive Long Term Goal: Reduce overall level, frequency, and intensity of the anxiety so that daily functioning is not impaired. Short Term Goal: 1.Increase understanding of the beliefs and messages that produce the worry and anxiety. Strategies: 1.Help client develop reality-based positive cognitive messages/self-talk. 2. Develop a coping card or other reminder which coping strategies are recorded for patient's later use    Diagnosis:   ICD-10-CM   1. Bipolar disorder with moderate depression (HCC)  F31.32      Plan: Patient today working well in session as she focused more on trying to have better motivation, good interaction with other people, manage her bipolar issues more effectively and maintain progress that she has made.  Does seem to recognize more of her own strength but also acknowledges that sometimes I will have a bad day and is easy to forget my progress.  Decreased and fearful thoughts.  Husband's health situation continues to be an issue for patient and in their marriage as he goes AGAINST MEDICAL ADVICE.  Shared a couple of recent situations with him that were frustrating for patient and she states it is helpful for her to be able to vent her frustrations in sessions as she is then able to move forward.  Husband is sometimes hurtful with his words and actions and patient is trying to deflect that more and  concentrate on the positive times she has with friends that loving care about her and treat her in a healthy way.  Enjoyed recent time with granddaughter and granddaughter's parents and hoping to see them again soon. Continue to encourage patient and trying to set healthier limits for herself and with others, to continue increasing/improving her own self-care, letting go of guilt, let others be supportive of her, using  good judgment, making decisions and thinking them through rather than being impulsive, refraining from self sabotage, seeing her strengths and her positives more quickly, engaging frequently with her 2 dogs that are very therapeutic for her, and realize the strength she shows when working with goal-directed behaviors to move in a direction that supports and helps her overall physical/emotional health, and her outlook moving forward.  Devere Pouch recognizes that she has made progress and needs to continue her work with goal-directed behaviors as she builds on her progress already made and wants to keep moving forward in a healthier and much more hopeful direction.  Goal review and progress/challenges noted with patient.  Next appointment within 2 weeks.   Barnie Bunde, LCSW

## 2024-05-28 ENCOUNTER — Other Ambulatory Visit (HOSPITAL_COMMUNITY): Payer: Self-pay | Admitting: Internal Medicine

## 2024-05-28 DIAGNOSIS — Z1231 Encounter for screening mammogram for malignant neoplasm of breast: Secondary | ICD-10-CM

## 2024-05-29 ENCOUNTER — Encounter: Payer: Self-pay | Admitting: Internal Medicine

## 2024-05-29 ENCOUNTER — Other Ambulatory Visit: Payer: Self-pay | Admitting: Internal Medicine

## 2024-05-29 DIAGNOSIS — M51362 Other intervertebral disc degeneration, lumbar region with discogenic back pain and lower extremity pain: Secondary | ICD-10-CM

## 2024-06-01 ENCOUNTER — Encounter: Payer: Self-pay | Admitting: Internal Medicine

## 2024-06-02 ENCOUNTER — Emergency Department (HOSPITAL_COMMUNITY)

## 2024-06-02 ENCOUNTER — Encounter (HOSPITAL_COMMUNITY): Payer: Self-pay | Admitting: *Deleted

## 2024-06-02 ENCOUNTER — Other Ambulatory Visit: Payer: Self-pay

## 2024-06-02 ENCOUNTER — Emergency Department (HOSPITAL_COMMUNITY)
Admission: EM | Admit: 2024-06-02 | Discharge: 2024-06-02 | Disposition: A | Discharge status: Home / Self Care | Attending: Emergency Medicine | Admitting: Emergency Medicine

## 2024-06-02 DIAGNOSIS — K529 Noninfective gastroenteritis and colitis, unspecified: Secondary | ICD-10-CM | POA: Diagnosis not present

## 2024-06-02 DIAGNOSIS — I700 Atherosclerosis of aorta: Secondary | ICD-10-CM | POA: Insufficient documentation

## 2024-06-02 DIAGNOSIS — N281 Cyst of kidney, acquired: Secondary | ICD-10-CM | POA: Insufficient documentation

## 2024-06-02 DIAGNOSIS — Z96643 Presence of artificial hip joint, bilateral: Secondary | ICD-10-CM | POA: Diagnosis not present

## 2024-06-02 DIAGNOSIS — E876 Hypokalemia: Secondary | ICD-10-CM | POA: Insufficient documentation

## 2024-06-02 DIAGNOSIS — A0472 Enterocolitis due to Clostridium difficile, not specified as recurrent: Secondary | ICD-10-CM | POA: Diagnosis not present

## 2024-06-02 DIAGNOSIS — K921 Melena: Secondary | ICD-10-CM | POA: Insufficient documentation

## 2024-06-02 DIAGNOSIS — K449 Diaphragmatic hernia without obstruction or gangrene: Secondary | ICD-10-CM | POA: Diagnosis not present

## 2024-06-02 DIAGNOSIS — R1084 Generalized abdominal pain: Secondary | ICD-10-CM | POA: Diagnosis not present

## 2024-06-02 DIAGNOSIS — K760 Fatty (change of) liver, not elsewhere classified: Secondary | ICD-10-CM | POA: Diagnosis not present

## 2024-06-02 DIAGNOSIS — R1110 Vomiting, unspecified: Secondary | ICD-10-CM | POA: Diagnosis present

## 2024-06-02 LAB — COMPREHENSIVE METABOLIC PANEL WITH GFR
ALT: 21 U/L (ref 0–44)
AST: 32 U/L (ref 15–41)
Albumin: 4 g/dL (ref 3.5–5.0)
Alkaline Phosphatase: 63 U/L (ref 38–126)
Anion gap: 13 (ref 5–15)
BUN: 14 mg/dL (ref 8–23)
CO2: 26 mmol/L (ref 22–32)
Calcium: 9.2 mg/dL (ref 8.9–10.3)
Chloride: 99 mmol/L (ref 98–111)
Creatinine, Ser: 0.87 mg/dL (ref 0.44–1.00)
GFR, Estimated: >60 mL/min (ref >60)
Glucose, Bld: 102 mg/dL — ABNORMAL HIGH (ref 70–99)
Potassium: 3.3 mmol/L — ABNORMAL LOW (ref 3.5–5.1)
Sodium: 138 mmol/L (ref 135–145)
Total Bilirubin: 0.6 mg/dL (ref 0.0–1.2)
Total Protein: 7.2 g/dL (ref 6.5–8.1)

## 2024-06-02 LAB — URINALYSIS, ROUTINE W REFLEX MICROSCOPIC
Bacteria, UA: NONE SEEN
Bilirubin Urine: NEGATIVE
Glucose, UA: NEGATIVE mg/dL
Hgb urine dipstick: NEGATIVE
Ketones, ur: 5 mg/dL — AB
Nitrite: NEGATIVE
Protein, ur: 30 mg/dL — AB
Specific Gravity, Urine: 1.028 (ref 1.005–1.030)
WBC, UA: >50 WBC/hpf (ref 0–5)
pH: 5 (ref 5.0–8.0)

## 2024-06-02 LAB — CBC
HCT: 41.4 % (ref 36.0–46.0)
Hemoglobin: 13.5 g/dL (ref 12.0–15.0)
MCH: 30.3 pg (ref 26.0–34.0)
MCHC: 32.6 g/dL (ref 30.0–36.0)
MCV: 93 fL (ref 80.0–100.0)
Platelets: 183 K/uL (ref 150–400)
RBC: 4.45 MIL/uL (ref 3.87–5.11)
RDW: 14.3 % (ref 11.5–15.5)
WBC: 9.3 K/uL (ref 4.0–10.5)
nRBC: 0 % (ref 0.0–0.2)

## 2024-06-02 LAB — LIPASE, BLOOD: Lipase: 40 U/L (ref 11–51)

## 2024-06-02 LAB — C DIFFICILE QUICK SCREEN W PCR REFLEX
C Diff antigen: POSITIVE — AB
C Diff toxin: NEGATIVE

## 2024-06-02 MED ORDER — IOHEXOL 300 MG/ML  SOLN
100.0000 mL | Freq: Once | INTRAMUSCULAR | Status: AC | PRN
  Administered 2024-06-02: 100 mL via INTRAVENOUS

## 2024-06-02 MED ORDER — METRONIDAZOLE 500 MG PO TABS: 500.0000 mg | ORAL_TABLET | Freq: Two times a day (BID) | ORAL | 0 refills | Status: DC

## 2024-06-02 MED ORDER — SODIUM CHLORIDE 0.9 % IV BOLUS
500.0000 mL | Freq: Once | INTRAVENOUS | Status: AC
  Administered 2024-06-02: 500 mL via INTRAVENOUS

## 2024-06-02 MED ORDER — CIPROFLOXACIN HCL 250 MG PO TABS
500.0000 mg | ORAL_TABLET | Freq: Once | ORAL | Status: AC
  Administered 2024-06-02: 500 mg via ORAL
  Filled 2024-06-02: qty 2

## 2024-06-02 MED ORDER — METRONIDAZOLE 500 MG PO TABS
500.0000 mg | ORAL_TABLET | Freq: Once | ORAL | Status: AC
  Administered 2024-06-02: 500 mg via ORAL
  Filled 2024-06-02: qty 1

## 2024-06-02 MED ORDER — CIPROFLOXACIN HCL 500 MG PO TABS: 500.0000 mg | ORAL_TABLET | Freq: Two times a day (BID) | ORAL | 0 refills | Status: DC

## 2024-06-02 MED ORDER — SODIUM CHLORIDE 0.9 % IV BOLUS
1000.0000 mL | Freq: Once | INTRAVENOUS | Status: AC
  Administered 2024-06-02: 1000 mL via INTRAVENOUS

## 2024-06-02 NOTE — ED Notes (Addendum)
 Pt ambulatory to bathroom - NT standby to assist if needed. No problems noted, steady gait

## 2024-06-02 NOTE — ED Provider Notes (Signed)
 [place] EMERGENCY DEPARTMENT AT [facility] Provider Note   CSN: [id number] Arrival date & time: 06/02/24  1147     Patient presents with: Diarrhea   Megan Whitney is a 68 y.o. female.  {Add pertinent medical, surgical, social history, OB history to HPI:32947}  Diarrhea Associated symptoms: abdominal pain and vomiting   Associated symptoms: no chills and no fever        Megan Whitney is a 68 y.o. female past medical history of GERD, colonic polyps, who presents to the Emergency Department complaining of multiple episodes of possible watery stools x 4 days.  Some intermittent vomiting as well.  Her diarrhea has been associated with diffuse abdominal pain that she describes as dull and crampy.  States her stools have been watery and brown in color.  Patient spouse states that he is concerned that her stools appear to have blood today.  She denies any fever or chills, new medications or recent antibiotics.  She has some mild weakness today that has been generalized.  Prior to Admission medications   Medication Sig Start Date End Date Taking? Authorizing Provider  acetaminophen  (TYLENOL ) 650 MG CR tablet Take 1,300 mg by mouth as needed for pain.    [provider]  amoxicillin  (AMOXIL ) 500 MG capsule SMARTSIG:4 Capsule(s) By Mouth 08/18/23   [provider]  atorvastatin  (LIPITOR) 20 MG tablet TAKE 1 TABLET EACH EVENING. 12/26/23   Tobie Suzzane POUR, MD  AUSTEDO  XR 48 MG TB24 TAKE 1 TABLET BY MOUTH EVERY DAY 05/01/24   Cottle, Lorene KANDICE Raddle., MD  cloZAPine  (CLOZARIL ) 25 MG tablet Take 3 tablets (75 mg total) by mouth at bedtime. 05/08/24   Cottle, Lorene KANDICE Raddle., MD  Ferrous Gluconate  (IRON  27 PO) Take by mouth.    [provider]  ketoconazole  (NIZORAL ) 2 % cream Apply 1 Application topically daily. 07/13/22   Tobie Suzzane POUR, MD  lamoTRIgine  (LAMICTAL ) 100 MG tablet Take 1 tablet (100 mg) in the am and take 2 tablets (200 mg) in the pm 04/08/24   Cottle,  Lorene KANDICE Raddle., MD  modafinil  (PROVIGIL ) 100 MG tablet Take 0.5 tablets (50 mg total) by mouth every morning. 05/08/24   Cottle, Lorene KANDICE Raddle., MD  Multiple Vitamin (MULTIVITAMIN ADULT PO) Take by mouth.    [provider]  pantoprazole  (PROTONIX ) 40 MG tablet Take 1 tablet (40 mg total) by mouth 2 (two) times daily. Office visit for further refills 04/29/24   Abran Norleen SAILOR, MD  senna (SENOKOT) 8.6 MG tablet Take 1 tablet by mouth daily.    [provider]  Vilazodone  HCl (VIIBRYD ) 40 MG TABS Take 1 tablet (40 mg total) by mouth daily. 04/08/24   Cottle, Lorene KANDICE Raddle., MD  Vilazodone  HCl 20 MG TABS Take 1 tablet (20 mg) by mouth daily along with a 40 mg tablet. 04/08/24   Cottle, Lorene KANDICE Raddle., MD    Allergies: Azithromycin, Penicillins, and Adhesive [tape]    Review of Systems  Constitutional:  Negative for appetite change, chills and fever.  Respiratory:  Negative for shortness of breath.   Cardiovascular:  Negative for chest pain.  Gastrointestinal:  Positive for abdominal pain, blood in stool, diarrhea, nausea and vomiting.  Genitourinary:  Negative for dysuria.  Musculoskeletal:  Negative for back pain.  Skin:  Negative for rash.  Neurological:  Positive for weakness. Negative for dizziness and numbness.    Updated Vital Signs BP 120/76 (BP Location: Right Arm)   Pulse  94   Temp 98.2 F (36.8 C)   Resp 18   Ht 5' (1.524 m)   Wt 52.2 kg   SpO2 100%   BMI 22.46 kg/m   Physical Exam Vitals and nursing note reviewed.  Constitutional:      General: She is not in acute distress.    Appearance: Normal appearance. She is not ill-appearing or toxic-appearing.  HENT:     Mouth/Throat:     Mouth: Mucous membranes are dry.  Cardiovascular:     Rate and Rhythm: Normal rate and regular rhythm.     Pulses: Normal pulses.  Pulmonary:     Effort: Pulmonary effort is normal.  Abdominal:     General: There is no distension.     Palpations: Abdomen is soft.     Tenderness: There  is abdominal tenderness. There is no guarding.     Comments: Diffuse tenderness mid abdomen.  No guarding or rebound tenderness.  Abdomen is soft  Musculoskeletal:        General: Normal range of motion.     Cervical back: Normal range of motion.  Skin:    General: Skin is warm.     Capillary Refill: Capillary refill takes less than 2 seconds.  Neurological:     General: No focal deficit present.     Mental Status: She is alert.     Sensory: No sensory deficit.     Motor: No weakness.     (all labs ordered are listed, but only abnormal results are displayed) Labs Reviewed  COMPREHENSIVE METABOLIC PANEL WITH GFR - Abnormal; Notable for the following components:      Result Value   Potassium 3.3 (*)    Glucose, Bld 102 (*)    All other components within normal limits  C DIFFICILE QUICK SCREEN W PCR REFLEX    GASTROINTESTINAL PANEL BY PCR, STOOL (REPLACES STOOL CULTURE)  LIPASE, BLOOD  CBC  URINALYSIS, ROUTINE W REFLEX MICROSCOPIC  POC OCCULT BLOOD, ED    EKG: None  Radiology: No results found.  {Document cardiac monitor, telemetry assessment procedure when appropriate:32947} Procedures   Medications Ordered in the ED  sodium chloride  0.9 % bolus 1,000 mL (has no administration in time range)      {Click here for ABCD2, HEART and other calculators REFRESH Note before signing:1}                              Medical Decision Making Patient here with 4-day history of watery diarrhea, intermittent vomiting and diffuse abdominal pain.  No fever or chills no recent medication changes or antibiotic use.  Amount and/or Complexity of Data Reviewed Labs: ordered. Radiology: ordered. Discussion of management or test interpretation with external provider(s): Workup this evening shows colitis.  Patient has been unable to provide stool sample.  C. difficile and GI panel was ordered but not obtained.  She has tolerated oral fluids and had a snack without difficulty.  She did have  some episodes in which her blood pressure was low, but this improved after IV fluids.  She ambulated in the department with steady gait.  States she is feeling better and ready for discharge home.  She does have close PCP follow-up and she has seen GI in the past.  I recommended that she have PCP follow-up tomorrow and if possible to collect stool sample so that PCP can order appropriate testing if needed.  She is agreeable to plan  appears appropriate for discharge home, she has documented anaphylactic reaction with penicillin so we will treat with Cipro and Flagyl.  Risk Prescription drug management.     {Document critical care time when appropriate  Document review of labs and clinical decision tools ie CHADS2VASC2, etc  Document your independent review of radiology images and any outside records  Document your discussion with family members, caretakers and with consultants  Document social determinants of health affecting pt's care  Document your decision making why or why not admission, treatments were needed:32947:::1}   Final diagnoses:  None    ED Discharge Orders     None

## 2024-06-02 NOTE — ED Triage Notes (Signed)
 Pt with diarrhea since Thursday night.  Watery per pt. Noted blood last night. Emesis once a day per pt since Thursday. Pt denies any recent use of antibiotics or travel. Pt states she has tried anti-diarrheal as well.

## 2024-06-02 NOTE — Discharge Instructions (Signed)
 Your workup this evening shows that you have colitis.  You have been prescribed antibiotics to help treat this.  Is important that you contact your primary care provider to arrange a follow-up appointment.  I have listed local GI provider that you may also contact to arrange follow-up appointment.  Return to the emergency department if you develop any new or worsening symptoms such as increasing pain vomiting, bloody stools or fever

## 2024-06-03 ENCOUNTER — Telehealth: Admitting: Internal Medicine

## 2024-06-03 DIAGNOSIS — K529 Noninfective gastroenteritis and colitis, unspecified: Secondary | ICD-10-CM

## 2024-06-03 DIAGNOSIS — E876 Hypokalemia: Secondary | ICD-10-CM | POA: Diagnosis not present

## 2024-06-03 LAB — GASTROINTESTINAL PANEL BY PCR, STOOL (REPLACES STOOL CULTURE)

## 2024-06-03 LAB — CLOSTRIDIUM DIFFICILE BY PCR, REFLEXED
Hypervirulent Strain: NEGATIVE
Toxigenic C. Difficile by PCR: NEGATIVE

## 2024-06-03 NOTE — Progress Notes (Signed)
 Virtual Visit via Video Note   Because of Megan Whitney co-morbid illnesses, she is at least at moderate risk for complications without adequate follow up.  This format is felt to be most appropriate for this patient at this time.  All issues noted in this document were discussed and addressed.  A limited physical exam was performed with this format.      Evaluation Performed:  Follow-up visit  Date:  06/03/2024   ID:  Megan, Whitney [birth year], MRN [id number]  Patient Location: Home Provider Location: Office/Clinic  Participants: Patient Location of Patient: Home Location of Provider: Telehealth Consent was obtain for visit to be over via telehealth. I verified that I am speaking with the correct person using two identifiers.  PCP:  Tobie Suzzane POUR, MD   Chief Complaint: Diarrhea  History of Present Illness:    Megan Whitney is a 68 y.o. female with a video visit for follow-up of ER visit on 06/02/24 for watery diarrhea for the last 2 days.  She had CT abdomen done, which showed marked severity diffuse colitis.  She had GI stool profile done, which is still pending.  Her C. difficile antigen was positive, but C. difficile toxin was negative.  She has been placed on ciprofloxacin and metronidazole.  She reports mild improvement in her symptoms today.  Does not report nausea or vomiting.  She has been able to tolerate p.o. intake today.  The patient does not have symptoms concerning for COVID-19 infection (fever, chills, cough, or new shortness of breath).   Past Medical, Surgical, Social History, Allergies, and Medications have been Reviewed.  Past Medical History:  Diagnosis Date   ADD (attention deficit disorder)    Allergy    Seasonal   Anemia    History of GI blood loss   Anxiety    Arthritis    Atrophy of vagina 10/07/2020   Bipolar 1 disorder (HCC)    Cancer (HCC)    Colon polyps    Depression    Edema, lower extremity    Epistaxis    Around  2011 or 2012, required cauterization.    Esophageal stricture    Fracture of superior pubic ramus (HCC) 11/28/2018   GERD (gastroesophageal reflux disease)    Headache(784.0)    Hyperlipidemia    Interstitial cystitis    Joint pain    Lactose intolerance    Lung cancer (HCC) 2002   Neuromuscular disorder (HCC)    Obesity    Osteoarthritis    Osteoporosis    Palpitations    Sleep apnea    Doesn't use a CPAP   Suicidal ideation 01/20/2020   Swallowing difficulty    Tardive dyskinesia    Past Surgical History:  Procedure Laterality Date   BALLOON DILATION  05/16/2012   Procedure: BALLOON DILATION;  Surgeon: Lamar JONETTA Aho, MD;  Location: [facility] ENDOSCOPY;  Service: Endoscopy;  Laterality: N/A;   BUNIONECTOMY  2011   COLONOSCOPY     ENTEROSCOPY  05/16/2012   Procedure: ENTEROSCOPY;  Surgeon: Lamar JONETTA Aho, MD;  Location: [facility] ENDOSCOPY;  Service: Endoscopy;  Laterality: N/A;   JOINT REPLACEMENT     right shoulder durgery 25 yrs ago  1988   TOTAL HIP ARTHROPLASTY Bilateral 2006, 2008   bilateral   TUBAL LIGATION  1990   WEDGE RESECTION  2002   lung cancer     Current Meds  Medication Sig   acetaminophen  (TYLENOL ) 650 MG CR tablet Take 1,300  mg by mouth as needed for pain.   atorvastatin  (LIPITOR) 20 MG tablet TAKE 1 TABLET EACH EVENING.   AUSTEDO  XR 48 MG TB24 TAKE 1 TABLET BY MOUTH EVERY DAY   ciprofloxacin (CIPRO) 500 MG tablet Take 1 tablet (500 mg total) by mouth 2 (two) times daily.   cloZAPine  (CLOZARIL ) 25 MG tablet Take 3 tablets (75 mg total) by mouth at bedtime.   Ferrous Gluconate  (IRON  27 PO) Take by mouth.   ketoconazole  (NIZORAL ) 2 % cream Apply 1 Application topically daily.   lamoTRIgine  (LAMICTAL ) 100 MG tablet Take 1 tablet (100 mg) in the am and take 2 tablets (200 mg) in the pm   metroNIDAZOLE (FLAGYL) 500 MG tablet Take 1 tablet (500 mg total) by mouth 2 (two) times daily.   modafinil  (PROVIGIL ) 100 MG tablet Take 0.5 tablets (50 mg total) by mouth every  morning.   Multiple Vitamin (MULTIVITAMIN ADULT PO) Take by mouth.   pantoprazole  (PROTONIX ) 40 MG tablet Take 1 tablet (40 mg total) by mouth 2 (two) times daily. Office visit for further refills   senna (SENOKOT) 8.6 MG tablet Take 1 tablet by mouth daily.   Vilazodone  HCl (VIIBRYD ) 40 MG TABS Take 1 tablet (40 mg total) by mouth daily.   Vilazodone  HCl 20 MG TABS Take 1 tablet (20 mg) by mouth daily along with a 40 mg tablet.     Allergies:   Azithromycin, Penicillins, and Adhesive [tape]   ROS:   Please see the history of present illness. All other systems reviewed and are negative.   Labs/Other Tests and Data Reviewed:    Recent Labs: 03/22/2024: TSH 1.370 06/02/2024: ALT 21; BUN 14; Creatinine, Ser 0.87; Hemoglobin 13.5; Platelets 183; Potassium 3.3; Sodium 138   Recent Lipid Panel Lab Results  Component Value Date/Time   CHOL 197 03/22/2024 08:14 AM   TRIG 74 03/22/2024 08:14 AM   HDL 92 03/22/2024 08:14 AM   CHOLHDL 2.1 03/22/2024 08:14 AM   CHOLHDL 3 07/22/2020 01:38 PM   LDLCALC 92 03/22/2024 08:14 AM    Wt Readings from Last 3 Encounters:  06/02/24 115 lb (52.2 kg)  03/27/24 115 lb 6.4 oz (52.3 kg)  11/17/23 124 lb (56.2 kg)     Objective:    Vital Signs:  There were no vitals taken for this visit.   VITAL SIGNS:  reviewed GEN:  no acute distress EYES:  sclerae anicteric, EOMI - Extraocular Movements Intact RESPIRATORY:  normal respiratory effort, symmetric expansion NEURO:  alert and oriented x 3, no obvious focal deficit PSYCH:  normal affect  ASSESSMENT & PLAN:    Colitis Recent ER chart reviewed, including imaging and blood test GI stool profile pending Continue ciprofloxacin and metronidazole Advised to maintain adequate hydration and advance diet as tolerated  Hypokalemia Likely due to acute colitis Advised to maintain adequate hydration If persistent on repeat BMP, will add potassium supplement    I discussed the assessment and treatment  plan with the patient. The patient was provided an opportunity to ask questions, and all were answered. The patient agreed with the plan and demonstrated an understanding of the instructions.   The patient was advised to call back or seek an in-person evaluation if the symptoms worsen or if the condition fails to improve as anticipated.  The above assessment and management plan was discussed with the patient. The patient verbalized understanding of and has agreed to the management plan.   Medication Adjustments/Labs and Tests Ordered: Current medicines are reviewed  at length with the patient today.  Concerns regarding medicines are outlined above.   Tests Ordered: No orders of the defined types were placed in this encounter.   Medication Changes: No orders of the defined types were placed in this encounter.    Note: This dictation was prepared with Dragon dictation along with smaller phrase technology. Similar sounding words can be transcribed inadequately or may not be corrected upon review. Any transcriptional errors that result from this process are unintentional.      Disposition:  Follow up  Signed, Suzzane MARLA Blanch, MD  06/03/2024 2:11 PM     [facility]

## 2024-06-03 NOTE — Assessment & Plan Note (Signed)
 Likely due to acute colitis Advised to maintain adequate hydration If persistent on repeat BMP, will add potassium supplement

## 2024-06-03 NOTE — Assessment & Plan Note (Signed)
 Recent ER chart reviewed, including imaging and blood test GI stool profile pending Continue ciprofloxacin and metronidazole Advised to maintain adequate hydration and advance diet as tolerated

## 2024-06-03 NOTE — Patient Instructions (Signed)
 Please start taking ciprofloxacin and metronidazole as prescribed.  Please maintain at least 64 ounces of fluid intake in a day.

## 2024-06-04 ENCOUNTER — Ambulatory Visit: Payer: Self-pay

## 2024-06-04 ENCOUNTER — Encounter: Payer: Self-pay | Admitting: Internal Medicine

## 2024-06-04 ENCOUNTER — Other Ambulatory Visit: Payer: Self-pay | Admitting: Internal Medicine

## 2024-06-04 DIAGNOSIS — K529 Noninfective gastroenteritis and colitis, unspecified: Secondary | ICD-10-CM

## 2024-06-04 MED ORDER — CLINDAMYCIN HCL 300 MG PO CAPS: 300.0000 mg | ORAL_CAPSULE | Freq: Three times a day (TID) | ORAL | 0 refills | Status: DC

## 2024-06-04 NOTE — Telephone Encounter (Signed)
 FYI Only or Action Required?: FYI only for provider.  Patient was last seen in primary care on 06/03/2024 by Tobie Suzzane POUR, MD.  Called Nurse Triage reporting No chief complaint on file..  Symptoms began several days ago.  Interventions attempted: Rest, hydration, or home remedies.  Symptoms are: unchanged.  Triage Disposition: Call PCP When Office is Open  Patient/caregiver understands and will follow disposition?: Yes FYI- Spoke with patient and advised that PCP discontinued her current antibiotic regimen (Cipro and Flagyl) and has order Clindamycin 300mg  1 tab TID x 5 days. Pt verbalized understanding, agreeable to the changes, will pick up meds today.   Copied from CRM [id number]. Topic: Clinical - Medication Question >> Jun 04, 2024 11:39 AM Precious C wrote: Reason for CRM: patient has been diarrhea since prescribed antibiotics ciprofloxacin (CIPRO) 500 MG tablet and metroNIDAZOLE (FLAGYL)  500 MG tablet, in return she has not been taking any antibiotics at all. She is requesting for different alternatives than these ones to take, Pt is requesting a call back moving forward and confirmation, callback [phone] >> Jun 04, 2024 12:50 PM Shanda MATSU wrote: Patient is calling back to adv that the diarrhea may be left over from when she was in the hospital, wants to adv us  that is going to continue taking both antibiotics.

## 2024-06-04 NOTE — Telephone Encounter (Signed)
 patient has diarrhea since last night, thinks its from the antibiotics, she is asking for a different one to take   FYI Only or Action Required?: Action required by provider: would like a new antibiotic for colitis, declined apt due to husband being ill with cancer and not comfortable leaving him..  Patient was last seen in primary care on 06/03/2024 by Tobie Suzzane POUR, MD.  Called Nurse Triage reporting Diarrhea.  Symptoms began yesterday.  Interventions attempted: Prescription medications: metronidazole and ciprofloxacin.  Symptoms are: gradually worsening.  Triage Disposition: Call PCP Within 24 Hours  Patient/caregiver understands and will follow disposition?: No, wishes to speak with PCP Reason for Disposition  Patient wants to stop the antibiotic  Answer Assessment - Initial Assessment Questions 1. ANTIBIOTIC: What antibiotic are you taking? How many times per day?     Started two yesterday metronidazole and ciprofloxacin 2. ANTIBIOTIC ONSET: When was the antibiotic started?     yesterday 3. DIARRHEA SEVERITY: How bad is the diarrhea? How many more stools have you had in the past 24 hours than normal?      2 4. ONSET: When did the diarrhea begin?      Nilsa, states was in ER all day Sunday.  5. BM CONSISTENCY: How loose or watery is the diarrhea?      loose 6. VOMITING: Are you also vomiting? If Yes, ask: How many times in the past 24 hours?      denies 7. ABDOMEN PAIN: Are you having any abdomen pain? If Yes, ask: What does it feel like? (e.g., crampy, dull, intermittent, constant)      denies 8. ABDOMEN PAIN SEVERITY: If present, ask: How bad is the pain?  (e.g., Scale 1-10; mild, moderate, or severe)     denies 9. ORAL INTAKE: If vomiting, Have you been able to drink liquids? How much liquids have you had in the past 24 hours?     no 10. HYDRATION: Any signs of dehydration? (e.g., dry mouth [not just dry lips], too weak to stand,  dizziness, new weight loss) When did you last urinate?       weak 11. EXPOSURE: Have you traveled to a foreign country recently? Have you been exposed to anyone with diarrhea? Could you have eaten any food that was spoiled?       no 12. OTHER SYMPTOMS: Do you have any other symptoms? (e.g., fever, blood in stool)       no 13. PREGNANCY: Is there any chance you are pregnant? When was your last menstrual period?       na  Protocols used: Diarrhea on Antibiotics-A-AH

## 2024-06-04 NOTE — Telephone Encounter (Signed)
 Dr patel sent in rx for patient

## 2024-06-05 ENCOUNTER — Other Ambulatory Visit: Payer: Self-pay | Admitting: Internal Medicine

## 2024-06-05 ENCOUNTER — Encounter: Payer: Self-pay | Admitting: Internal Medicine

## 2024-06-05 DIAGNOSIS — K529 Noninfective gastroenteritis and colitis, unspecified: Secondary | ICD-10-CM

## 2024-06-05 LAB — URINE CULTURE: Culture: 80000 — AB

## 2024-06-06 ENCOUNTER — Ambulatory Visit: Admitting: Psychiatry

## 2024-06-06 ENCOUNTER — Telehealth (HOSPITAL_BASED_OUTPATIENT_CLINIC_OR_DEPARTMENT_OTHER): Payer: Self-pay | Admitting: *Deleted

## 2024-06-06 NOTE — Telephone Encounter (Signed)
 Post ED Visit - Positive Culture Follow-up  Culture report reviewed by antimicrobial stewardship pharmacist: [facility] [x]  Amon Rocher, Pharm.D. []  Venetia Gully, Pharm.D., BCPS AQ-ID []  Garrel Crews, Pharm.D., BCPS []  Almarie Lunger, Pharm.D., BCPS []  [place], [place].D., BCPS, AAHIVP []  Rosaline Bihari, Pharm.D., BCPS, AAHIVP []  Vernell Meier, PharmD, BCPS []  Latanya Hint, PharmD, BCPS []  Donald Medley, PharmD, BCPS []  Rocky Bold, PharmD []  Dorothyann Alert, PharmD, BCPS []  Morene Babe, PharmD  [facility] []  Rosaline Edison, PharmD []  Romona Bliss, PharmD []  Dolphus Roller, PharmD []  Veva Seip, Rph []  Vernell Daunt) Leonce, PharmD []  Eva Allis, PharmD []  Rosaline Millet, PharmD []  Iantha Batch, PharmD []  Arvin Gauss, PharmD []  Wanda Hasting, PharmD []  Ronal Rav, PharmD []  Rocky Slade, PharmD []  Bard Jeans, PharmD   Positive Urine culture Treated with Ciprofloxacin and Metronidazole, organism sensitive to the same and no further patient follow-up is required at this time.  Jama Lisle Blondie 06/06/2024, 11:23 AM

## 2024-06-07 ENCOUNTER — Other Ambulatory Visit: Payer: Self-pay | Admitting: Internal Medicine

## 2024-06-07 DIAGNOSIS — B3731 Acute candidiasis of vulva and vagina: Secondary | ICD-10-CM

## 2024-06-07 MED ORDER — FLUCONAZOLE 150 MG PO TABS: 150.0000 mg | ORAL_TABLET | ORAL | 0 refills | Status: DC

## 2024-06-13 ENCOUNTER — Telehealth: Payer: Self-pay | Admitting: Psychiatry

## 2024-06-13 ENCOUNTER — Ambulatory Visit: Admitting: Psychiatry

## 2024-06-13 NOTE — Telephone Encounter (Signed)
 Rtc to pt and she reports she had to go to ER for what she thinks was food poisoning. She was severely dehydrated and once she got fluids she felt better. Today was a really bad day, she has a lot on her plate and laid in bed all day. She is not sure what happen this evening but she is finally up and moving, she assumes her hypomania. Informed her I would discuss with Dr. Geoffry but she would probably need to be seen. All medications are the same.

## 2024-06-13 NOTE — Telephone Encounter (Signed)
 Patient called in stating that she has been very severely depressed and needs some advice on what to do. She missed appt this month due to being in hospital. She would like a rtc [phone] Appt 11/25 place on [facility]

## 2024-06-14 NOTE — Telephone Encounter (Signed)
 Touch base with her next week and see how she is doing.  I dont' want to be too quick to change meds on her bc she is fragile and med sensitive and complicated.

## 2024-06-17 ENCOUNTER — Ambulatory Visit: Admitting: Psychiatry

## 2024-06-17 DIAGNOSIS — F3132 Bipolar disorder, current episode depressed, moderate: Secondary | ICD-10-CM

## 2024-06-17 NOTE — Progress Notes (Signed)
 [facility] Counselor/Therapist Progress Note  Patient ID: Megan Whitney, MRN: [id number],    Date: 06/17/2024  Time Spent: 53 minutes   Treatment Type: Individual Therapy  Reported Symptoms:  bipolar, anxiety, depression, some feeling sorry for myself but doesn't last long, anger, irritability    Mental Status Exam:  Appearance:   Casual     Behavior:  Appropriate, Sharing, and Motivated  Motor:  Normal  Speech/Language:   Clear and Coherent  Affect:  Depressed and anxiety  Mood:  anxious and depressed  Thought process:  goal directed  Thought content:    Rumination  Sensory/Perceptual disturbances:    WNL  Orientation:  oriented to person, place, time/date, situation, day of week, month of year, year, and stated date of Oct. 13, 2025  Attention:  Fair  Concentration:  Fair  Memory:  Some memory issues reported  Fund of knowledge:   Good and Fair  Insight:    Fair and sometimes poor  Judgment:   Fair  Impulse Control:  Poor   Risk Assessment: Danger to Self:  No Self-injurious Behavior: No Danger to Others: No Duty to Warn:no Physical Aggression / Violence:No  Access to Firearms a concern: No  Gang Involvement:No   Subjective:  Patient in session today reporting anger, irritability, depression, irritated, some decrease in motivation, anxiety, bipolar, and feeling sorry for herself. Denies any thoughts to harm self or others. Don't get things done like I used to but willing to work on this. Feeling some loneliness and talked through some of her thoughts and feelings currently as her husband continues his chemotherapy by pills.  States that she has not done as well and staying in touch with friends and plans to pick back up on reaching out to them this week.  Feeling that she is trying to do the right things in terms of staying on her meds, getting out of the house some, walking some and also wanting to check out at a senior adult center to be involved in.   Frustrated with husband's continued drinking despite being advised by medical staff not to do so.  Trying to see more of her strength than her weaknesses, and refraining from negatively labeling herself.  Feeling bad that she is behind in her chores at home and discussed this in more detail today looking at a way of scheduling some of the things that she needs to get done at home and having breaks in between various chores, which she seemed to think would be more doable.  Continues to work on trying to reduce her anxiety, anger, and frustrations and notes that doing some enjoyable reading has been helpful for her.   Interventions: Cognitive Behavioral Therapy, Solution-Oriented/Positive Psychology, and Ego-Supportive Long Term Goal: Reduce overall level, frequency, and intensity of the anxiety so that daily functioning is not impaired. Short Term Goal: 1.Increase understanding of the beliefs and messages that produce the worry and anxiety. Strategies: 1.Help client develop reality-based positive cognitive messages/self-talk. 2. Develop a coping card or other reminder which coping strategies are recorded for patient's later use    Diagnosis:   ICD-10-CM   1. Bipolar disorder with moderate depression (HCC)  F31.32      Plan:  Patient working in session today and focusing more on what she can do to help herself emotionally and physically versus what she cannot do.  Patient did a good job of interacting and working on this, seeing more positives and negatives.  Also use  part of our session time for her to work on some mixed feelings of anxiety, depression, and grief related issues regarding her husband who has cancer.  (Not all details included in this note due to patient privacy needs).  Seeing more of her own strength.  Not as fearful at times.  Continue to encourage patient in her taking better care of herself and setting healthy limits with others, letting go of guilt, increasing/improving her  own self-care, letting others be supportive of her, using good judgment, making decisions and thinking them through rather than being impulsive, refrain from self sabotage, seeing her strengths and her positives more quickly, engaging frequently with her 2 dogs that are very therapeutic for her, and realize the strength she shows as she works with goal-directed behaviors to move in a direction that supports and helps her overall physical/emotional health and her outlook into the future.  Megan Whitney does recognize that she has made some progress and needs to continue her work with goal-directed behaviors building on the progress already made and her wanting to move forward in a healthier and more hopeful direction into the future.  Goal review and progress/challenges noted with patient.  Next appointment within 2 weeks.   Barnie Bunde, LCSW

## 2024-06-20 ENCOUNTER — Ambulatory Visit: Admitting: Physical Medicine and Rehabilitation

## 2024-06-24 NOTE — Telephone Encounter (Signed)
Noted will let pt know 

## 2024-06-24 NOTE — Telephone Encounter (Signed)
 There is not an obvious med change I can make without seeing her.  I see her in 5 days.

## 2024-06-24 NOTE — Telephone Encounter (Signed)
 Pt reports feeling severely depressed, denies SI. Pt also reports her mouth movements and clenching of her jaws have returned.   Pt asking about Dr. Calhoun recommendation.

## 2024-06-25 NOTE — Telephone Encounter (Signed)
 Pt has been added to cancellation list, her scheduled apt isn't until November. Pt will need to be seen before then.

## 2024-06-27 ENCOUNTER — Ambulatory Visit: Admitting: Psychiatry

## 2024-06-27 DIAGNOSIS — F3132 Bipolar disorder, current episode depressed, moderate: Secondary | ICD-10-CM

## 2024-06-27 NOTE — Progress Notes (Signed)
 [facility] Counselor/Therapist Progress Note  Patient ID: Megan Whitney, MRN: [id number],    Date: 06/27/2024  Time Spent: 55 minutes   Treatment Type: Individual Therapy  Reported Symptoms: anxiety heavier, but not the extremes like bipolar, feeling sorry for myself, anger re: husband, irritability, depression    Mental Status Exam:  Appearance:   Casual     Behavior:  Appropriate, Sharing, and Motivated  Motor:  Normal  Speech/Language:   Clear and Coherent  Affect:  Depressed and anxious, angry, sad, but motivated in session  Mood:  angry, anxious, depressed, and sad  Thought process:  goal directed  Thought content:    Rumination  Sensory/Perceptual disturbances:    WNL  Orientation:  oriented to person, place, time/date, situation, day of week, month of year, year, and stated date of Oct. 23, 2025  Attention:  Good  Concentration:  Good and Fair  Memory:  WNL  Fund of knowledge:   Good  Insight:    Good and Fair  Judgment:   Good  Impulse Control:  Good   Risk Assessment: Danger to Self:  No Self-injurious Behavior: No Danger to Others: No Duty to Warn:no Physical Aggression / Violence:No  Access to Firearms a concern: No  Gang Involvement:No   Subjective:   Patient in for appt today and reports symptoms of anxiety, some bipolar, anger, irritable at times, some decrease in motivation, and feeling sorry for myself.  **Needing to get more sleep rather than staying up late and not intentionally trying to go to sleep. Some loneliness. Has several friends and family that she is in touch with often and encouraged her to continue that contact.  Working on trying to use some of her strategies for self-care more often and believing more in herself, and her ability to manage difficult circumstances.  Denies any thoughts of self-harm at end of session today and seem to have more belief in herself and motivation to participate in some activities that are  healthy for her including friends, extended family, senior adult activities at the senior center in [place], her church, and others in her neighborhood.  Admits there have been times when she did not contact friends and neighbors and felt worse, and agrees now to let them continue to be a part of her support system and let them know how and when they can be of help to her as they have offered.  Continues to be frustrated with husband's drinking but patient tries to focus on other things.  Plans to get outside and walk some, go back to the senior adult center, be more regular in her contacts with neighbors and friends, and get out and go places where she has interest, without feeling guilty.    Interventions: Cognitive Behavioral Therapy, Solution-Oriented/Positive Psychology, and Ego-Supportive Long Term Goal: Reduce overall level, frequency, and intensity of the anxiety so that daily functioning is not impaired. Short Term Goal: 1.Increase understanding of the beliefs and messages that produce the worry and anxiety. Strategies: 1.Help client develop reality-based positive cognitive messages/self-talk. 2. Develop a coping card or other reminder which coping strategies are recorded for patient's later use    Diagnosis:   ICD-10-CM   1. Bipolar disorder with moderate depression (HCC)  F31.32      Plan:  Patient today doing some good work on her anxiety, anger, some depression, and irritability.  Worked really well in session from start to finish today and seem to feel a little more confident  at end of session.  As noted above she is working on issues surrounding her husband's cancer, his hurtful treatment of her, and feeling less than.  She did a good job today of breaking down some of the issues that she is dealing with and talking about them which seem to lead to better confidence within herself not that she can fix everything with her husband but that she can cope more effectively and can  also take breaks as needed.  (Not all details included in this note due to patient privacy needs).  Did seem to note more strength within herself by end of session and hoping that she can hang on to some of the strength going forward.  Responded well to intervention today.  Knows to stay on her medication as prescribed and is doing that.  Does have follow-up appointments with this therapist and her prescribing psychiatrist.  Working with patient and encouraging her to continue in her better care of herself and setting healthier limits with others, increasing/improving her own self-care, letting go of guilt, letting others be supportive of her, using good judgment, making decisions and thinking them through rather than being impulsive, refrain from self sabotage, seeing her strengths and her positives more quickly, engage frequently with her 2 dogs that are very therapeutic for her, and recognize the strength she shows as she works with goal-directed behaviors to move in a direction that supports and helps her overall physical/emotional health and her outlook into the future.  Patient does recognize some of her progress but also is needing to work more on holding onto that progress so as to negotiate some of her time with husband or if when she is alone, more effectively.  Megan Whitney wants to hang onto any progress she has made and be able to move forward in a healthier and more consistent direction into the future.  Goal review and progress/challenges noted with patient.  Next appointment within 2 weeks.   Barnie Bunde, LCSW

## 2024-06-27 NOTE — Progress Notes (Deleted)
 Chief Complaint: Primary GI MD: Dr. Abran  HPI:  *** is a  ***  who was referred to me by Tobie Suzzane POUR, MD for a complaint of *** .     Discussed the use of AI scribe software for clinical note transcription with the patient, who gave verbal consent to proceed.  History of Present Illness      PREVIOUS GI WORKUP   Colonoscopy 11/2023 - The examined portion of the ileum was normal.  - Three 2 to 5 mm polyps in the ascending colon and in the cecum, removed with a cold snare. Resected and retrieved.  - Mucosal ulceration in the cecum. Biopsied.   Past Medical History:  Diagnosis Date   ADD (attention deficit disorder)    Allergy    Seasonal   Anemia    History of GI blood loss   Anxiety    Arthritis    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    Esophageal stricture    Fracture of superior pubic ramus (HCC) 11/28/2018   GERD (gastroesophageal reflux disease)    Headache(784.0)    Hyperlipidemia    Interstitial cystitis    Joint pain    Lactose intolerance    Lung cancer (HCC) 2002   Neuromuscular disorder (HCC)    Obesity    Osteoarthritis    Osteoporosis    Palpitations    Sleep apnea    Doesn't use a CPAP   Suicidal ideation 01/20/2020   Swallowing difficulty    Tardive dyskinesia     Past Surgical History:  Procedure Laterality Date   BALLOON DILATION  05/16/2012   Procedure: BALLOON DILATION;  Surgeon: Lamar JONETTA Aho, MD;  Location: [facility] ENDOSCOPY;  Service: Endoscopy;  Laterality: N/A;   BUNIONECTOMY  2011   COLONOSCOPY     ENTEROSCOPY  05/16/2012   Procedure: ENTEROSCOPY;  Surgeon: Lamar JONETTA Aho, MD;  Location: [facility] ENDOSCOPY;  Service: Endoscopy;  Laterality: N/A;   JOINT REPLACEMENT     right shoulder durgery 25 yrs ago  1988   TOTAL HIP ARTHROPLASTY Bilateral 2006, 2008   bilateral    TUBAL LIGATION  1990   WEDGE RESECTION  2002   lung cancer    Current Outpatient Medications  Medication Sig Dispense Refill   clindamycin (CLEOCIN) 300 MG capsule Take 1 capsule (300 mg total) by mouth 3 (three) times daily. 15 capsule 0   fluconazole (DIFLUCAN) 150 MG tablet Take 1 tablet (150 mg total) by mouth every 3 (three) days. Take second and third dose only if persistent symptoms. 3 tablet 0   acetaminophen  (TYLENOL ) 650 MG CR tablet Take 1,300 mg by mouth as needed for pain.     atorvastatin  (LIPITOR) 20 MG tablet TAKE 1 TABLET EACH EVENING. 90 tablet 3   AUSTEDO  XR 48 MG TB24 TAKE 1 TABLET BY MOUTH EVERY DAY 30 tablet 2   cloZAPine  (CLOZARIL ) 25 MG tablet Take 3 tablets (75 mg total) by mouth at bedtime. 90 tablet 2   Ferrous Gluconate  (IRON  27 PO) Take by mouth.     ketoconazole  (NIZORAL ) 2 % cream Apply 1 Application topically daily. 60 g 0   lamoTRIgine  (LAMICTAL ) 100 MG tablet Take 1 tablet (100 mg) in the am and take 2 tablets (200 mg) in  the pm 270 tablet 0   modafinil  (PROVIGIL ) 100 MG tablet Take 0.5 tablets (50 mg total) by mouth every morning.     Multiple Vitamin (MULTIVITAMIN ADULT PO) Take by mouth.     pantoprazole  (PROTONIX ) 40 MG tablet Take 1 tablet (40 mg total) by mouth 2 (two) times daily. Office visit for further refills 180 tablet 0   senna (SENOKOT) 8.6 MG tablet Take 1 tablet by mouth daily.     Vilazodone  HCl (VIIBRYD ) 40 MG TABS Take 1 tablet (40 mg total) by mouth daily. 90 tablet 0   Vilazodone  HCl 20 MG TABS Take 1 tablet (20 mg) by mouth daily along with a 40 mg tablet. 90 tablet 0   No current facility-administered medications for this visit.    Allergies as of 06/28/2024 - Review Complete 06/03/2024  Allergen Reaction Noted   Azithromycin Anaphylaxis    Penicillins Anaphylaxis    Adhesive [tape] Other (See Comments) 06/04/2014    Family History  Problem Relation Age of Onset   Arthritis Mother    Hearing loss Mother    Hyperlipidemia  Mother    Hypertension Mother    Depression Mother    Anxiety disorder Mother    Obesity Mother    Sudden death Mother    Hypertension Father    Diabetes Mellitus II Father    Heart disease Father    Arthritis Father    Cancer Father        Brain   COPD Father    Diabetes Father    Hyperlipidemia Father    Sleep apnea Father    Early death Sister        Aneroxia/Bulimic   Depression Brother    Early death Hydrographic surveyor Accident   Stroke Maternal Grandmother    Hypertension Maternal Grandmother    Arthritis Maternal Grandfather    Heart attack Maternal Grandfather    Hearing loss Maternal Grandfather    Depression Daughter    Drug abuse Daughter    Heart disease Daughter    Hypertension Daughter    Colon cancer Neg Hx    Esophageal cancer Neg Hx    Rectal cancer Neg Hx    Colon polyps Neg Hx    Stomach cancer Neg Hx     Social History   Socioeconomic History   Marital status: Married    Spouse name: Not on file   Number of children: 1   Years of education: 12   Highest education level: 12th grade  Occupational History   Occupation: retired  Tobacco Use   Smoking status: Never   Smokeless tobacco: Never  Vaping Use   Vaping status: Never Used  Substance and Sexual Activity   Alcohol use: Not Currently    Alcohol/week: 1.0 standard drink of alcohol    Types: 1 Glasses of wine per week    Comment: 1 glass wine occassionally   Drug use: No   Sexual activity: Yes  Other Topics Concern   Not on file  Social History Narrative   Pt lives in [place] with husband Francis.  Followed by Dr. Geoffry for psychiatry and Marval Bunde for therapy.   Right handed   Drinks caffeine   One story home   Married lives with husband   retired   Social Drivers of [employer]: Low Risk  (06/03/2024)   Overall Financial Resource Strain (CARDIA)    Difficulty of Paying Living Expenses:  Not very hard  Food Insecurity: Food Insecurity Present  (06/03/2024)   Hunger Vital Sign    Worried About Running Out of Food in the Last Year: Sometimes true    Ran Out of Food in the Last Year: Never true  Transportation Needs: No Transportation Needs (06/03/2024)   PRAPARE - Administrator, Civil Service (Medical): No    Lack of Transportation (Non-Medical): No  Physical Activity: Inactive (06/03/2024)   Exercise Vital Sign    Days of Exercise per Week: 0 days    Minutes of Exercise per Session: Not on file  Stress: Stress Concern Present (06/03/2024)   [employer] of Occupational Health - Occupational Stress Questionnaire    Feeling of Stress: Very much  Social Connections: Socially Integrated (06/03/2024)   Social Connection and Isolation Panel    Frequency of Communication with Friends and Family: More than three times a week    Frequency of Social Gatherings with Friends and Family: Once a week    Attends Religious Services: More than 4 times per year    Active Member of [employer] or Organizations: Yes    Attends Engineer, structural: More than 4 times per year    Marital Status: Married  Catering manager Violence: Not At Risk (06/01/2023)   Humiliation, Afraid, Rape, and Kick questionnaire    Fear of Current or Ex-Partner: No    Emotionally Abused: No    Physically Abused: No    Sexually Abused: No    Review of Systems:    Constitutional: No weight loss, fever, chills, weakness or fatigue HEENT: Eyes: No change in vision               Ears, Nose, Throat:  No change in hearing or congestion Skin: No rash or itching Cardiovascular: No chest pain, chest pressure or palpitations   Respiratory: No SOB or cough Gastrointestinal: See HPI and otherwise negative Genitourinary: No dysuria or change in urinary frequency Neurological: No headache, dizziness or syncope Musculoskeletal: No new muscle or joint pain Hematologic: No bleeding or bruising Psychiatric: No history of depression or anxiety    Physical Exam:   Vital signs: There were no vitals taken for this visit.  Constitutional: NAD, alert and cooperative Head:  Normocephalic and atraumatic. Eyes:   PEERL, EOMI. No icterus. Conjunctiva pink. Respiratory: Respirations even and unlabored. Lungs clear to auscultation bilaterally.   No wheezes, crackles, or rhonchi.  Cardiovascular:  Regular rate and rhythm. No peripheral edema, cyanosis or pallor.  Gastrointestinal:  Soft, nondistended, nontender. No rebound or guarding. Normal bowel sounds. No appreciable masses or hepatomegaly. Rectal:  Declines Msk:  Symmetrical without gross deformities. Without edema, no deformity or joint abnormality.  Neurologic:  Alert and  oriented x4;  grossly normal neurologically.  Skin:   Dry and intact without significant lesions or rashes. Psychiatric: Oriented to person, place and time. Demonstrates good judgement and reason without abnormal affect or behaviors.  Physical Exam    RELEVANT LABS AND IMAGING: CBC    Component Value Date/Time   WBC 9.3 06/02/2024 1308   RBC 4.45 06/02/2024 1308   HGB 13.5 06/02/2024 1308   HGB 13.6 03/22/2024 0814   HCT 41.4 06/02/2024 1308   HCT 42.0 03/22/2024 0814   PLT 183 06/02/2024 1308   PLT 220 03/22/2024 0814   MCV 93.0 06/02/2024 1308   MCV 93 03/22/2024 0814   MCH 30.3 06/02/2024 1308   MCHC 32.6 06/02/2024 1308   RDW 14.3 06/02/2024 1308  RDW 12.7 03/22/2024 0814   LYMPHSABS 2.8 03/22/2024 0814   MONOABS 0.7 04/04/2019 1004   EOSABS 0.3 03/22/2024 0814   BASOSABS 0.0 03/22/2024 0814    CMP     Component Value Date/Time   NA 138 06/02/2024 1308   NA 142 03/22/2024 0814   K 3.3 (L) 06/02/2024 1308   CL 99 06/02/2024 1308   CO2 26 06/02/2024 1308   GLUCOSE 102 (H) 06/02/2024 1308   BUN 14 06/02/2024 1308   BUN 14 03/22/2024 0814   CREATININE 0.87 06/02/2024 1308   CALCIUM  9.2 06/02/2024 1308   PROT 7.2 06/02/2024 1308   PROT 6.8 03/22/2024 0814   ALBUMIN 4.0 06/02/2024 1308   ALBUMIN 4.5  03/22/2024 0814   AST 32 06/02/2024 1308   ALT 21 06/02/2024 1308   ALKPHOS 63 06/02/2024 1308   BILITOT 0.6 06/02/2024 1308   BILITOT 0.5 03/22/2024 0814   GFRNONAA >60 06/02/2024 1308   GFRAA 96 03/02/2020 1433     Assessment/Plan:   Assessment and Plan Assessment & Plan   Colitis Colonoscopy 11/2023 with mucosal ulceration in cecum with biopsies to suggest ischemic colitis or diverticular associated colitis.  ED visit 05/2024 for nausea, vomiting, bloody diarrhea showed CT abdomen pelvis with contrast with marked diffuse infectious/inflammatory colitis. C diff PCR negative. CBC/CMP normal   GERD with large hiatal hernia Dysphagia EGD 2023 with large hiatal hernia measuring 10-12 cm with camerons erosions. S/p dilation of peptic stricture.     Nestor Mollie RIGGERS [facility] 06/27/2024, 12:49 PM  Cc: Tobie Suzzane POUR, MD

## 2024-06-28 ENCOUNTER — Ambulatory Visit: Admitting: Gastroenterology

## 2024-07-01 ENCOUNTER — Encounter: Payer: Self-pay | Admitting: Psychiatry

## 2024-07-01 ENCOUNTER — Ambulatory Visit: Admitting: Psychiatry

## 2024-07-01 DIAGNOSIS — Z79899 Other long term (current) drug therapy: Secondary | ICD-10-CM

## 2024-07-01 DIAGNOSIS — G3184 Mild cognitive impairment, so stated: Secondary | ICD-10-CM

## 2024-07-01 DIAGNOSIS — G2401 Drug induced subacute dyskinesia: Secondary | ICD-10-CM | POA: Diagnosis not present

## 2024-07-01 DIAGNOSIS — R7989 Other specified abnormal findings of blood chemistry: Secondary | ICD-10-CM | POA: Diagnosis not present

## 2024-07-01 DIAGNOSIS — F900 Attention-deficit hyperactivity disorder, predominantly inattentive type: Secondary | ICD-10-CM

## 2024-07-01 DIAGNOSIS — F5105 Insomnia due to other mental disorder: Secondary | ICD-10-CM | POA: Diagnosis not present

## 2024-07-01 DIAGNOSIS — F3132 Bipolar disorder, current episode depressed, moderate: Secondary | ICD-10-CM | POA: Diagnosis not present

## 2024-07-01 DIAGNOSIS — F411 Generalized anxiety disorder: Secondary | ICD-10-CM

## 2024-07-01 MED ORDER — PRAMIPEXOLE DIHYDROCHLORIDE 0.25 MG PO TABS: 0.2500 mg | ORAL_TABLET | Freq: Two times a day (BID) | ORAL | 0 refills | Status: DC

## 2024-07-01 NOTE — Progress Notes (Signed)
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    Psychiatric/Behavioral:  Positive for dysphoric mood. Negative for agitation, behavioral problems, confusion, decreased concentration, hallucinations, self-injury, sleep disturbance and suicidal ideas. The patient is nervous/anxious. The patient is not hyperactive.   No falls since here. Not currently depressed but unable to remove this from the list.  Medications: I have reviewed  the patient's current medications.  Current Outpatient Medications  Medication Sig Dispense Refill   acetaminophen  (TYLENOL ) 650 MG CR tablet Take 1,300 mg by mouth as needed for pain.     atorvastatin  (LIPITOR) 20  MG tablet TAKE 1 TABLET EACH EVENING. 90 tablet 3   A[place]EDO  XR 48 MG TB24 TAKE 1 TABLET BY MOUTH EVERY DAY 30 tablet 2   clindamycin (CLEOCIN) 300 MG capsule Take 1 capsule (300 mg total) by mouth 3 (three) times daily. 15 capsule 0   cloZAPine  (CLOZARIL ) 25 MG tablet Take 3 tablets (75 mg total) by mouth at bedtime. 90 tablet 2   Ferrous Gluconate  (IRON  27 PO) Take by mouth.     fluconazole (DIFLUCAN) 150 MG tablet Take 1 tablet (150 mg total) by mouth every 3 (three) days. Take second and third dose only if persistent symptoms. 3 tablet 0   ketoconazole  (NIZORAL ) 2 % cream Apply 1 Application topically daily. 60 g 0   lamoTRIgine  (LAMICTAL ) 100 MG tablet Take 1 tablet (100 mg) in the am and take 2 tablets (200 mg) in the pm 270 tablet 0   modafinil  (PROVIGIL ) 100 MG tablet Take 0.5 tablets (50 mg total) by mouth every morning.     Multiple Vitamin (MULTIVITAMIN ADULT PO) Take by mouth.     pantoprazole  (PROTONIX ) 40 MG tablet Take 1 tablet (40 mg total) by mouth 2 (two) times daily. Office visit for further refills 180 tablet 0   pramipexole  (MIRAPEX ) 0.25 MG tablet Take 1 tablet (0.25 mg total) by mouth in the morning and at bedtime. 60 tablet 0   senna (SENOKOT) 8.6 MG tablet Take 1 tablet by mouth daily.     Vilazodone  HCl (VIIBRYD ) 40 MG TABS Take 1 tablet (40 mg total) by mouth daily. 90 tablet 0   Vilazodone  HCl 20 MG TABS Take 1 tablet (20 mg) by mouth daily along with a 40 mg tablet. 90 tablet 0   No current facility-administered medications for this visit.    Medication Side Effects: Other: tremor and weight gain.  Dyskinesia appears better  SE bettter than they were.  Balance problems intermittently  Allergies:  Allergies  Allergen Reactions   Azithromycin Anaphylaxis   Penicillins Anaphylaxis    DID THE REACTION INVOLVE: Swelling of the face/tongue/throat, SOB, or low BP? Yes Sudden or severe rash/hives, skin peeling, or the inside of the mouth or nose? Yes Did it require  medical treatment? No When did it last happen?       If all above answers are NO, may proceed with cephalosporin use.  Patient reacts to Z pack.  HAS Taken amoxicillin  fine.   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    Esophageal stricture    Fracture of superior pubic ramus (HCC) 11/28/2018   GERD (gastroesophageal reflux disease)    Headache(784.0)    Hyperlipidemia    Interstitial cystitis    Joint pain    Lactose intolerance    Lung  cancer [facility]) 2002   Neuromuscular disorder (HCC)    Obesity    Osteoarthritis    Osteoporosis    Palpitations    Sleep apnea    Doesn't use a CPAP   Suicidal ideation 01/20/2020   Swallowing difficulty    Tardive dyskinesia     Family History  Problem Relation Age of Onset   Arthritis Mother    Hearing loss Mother    Hyperlipidemia Mother    Hypertension Mother    Depression Mother    Anxiety disorder Mother    Obesity Mother    Sudden death Mother    Hypertension Father    Diabetes Mellitus II Father    Heart disease Father    Arthritis Father    Cancer Father        Brain   COPD Father    Diabetes Father    Hyperlipidemia Father    Sleep apnea Father    Early death Sister        Aneroxia/Bulimic   Depression Brother    Early death Hydrographic Surveyor Accident   Stroke Maternal Grandmother    Hypertension Maternal Grandmother    Arthritis Maternal Grandfather    Heart attack Maternal Grandfather    Hearing loss Maternal Grandfather    Depression Daughter    Drug abuse Daughter    Heart disease Daughter    Hypertension Daughter    Colon cancer Neg  Hx    Esophageal cancer Neg Hx    Rectal cancer Neg Hx    Colon polyps Neg Hx    Stomach cancer Neg Hx     Social History   Socioeconomic History   Marital status: Married    Spouse name: Not on file   Number of children: 1   Years of education: 12   Highest education level: 12th grade  Occupational History   Occupation: retired  Tobacco Use   Smoking status: Never   Smokeless tobacco: Never  Vaping Use   Vaping status: Never Used  Substance and Sexual Activity   Alcohol use: Not Currently    Alcohol/week: 1.0 standard drink of alcohol    Types: 1 Glasses of wine per week    Comment: 1 glass wine occassionally   Drug use: No   Sexual activity: Yes  Other Topics Concern   Not on file  Social History Narrative   Pt lives in [place] with husband Francis.  Followed by Dr. Geoffry for psychiatry and Marval Bunde for therapy.   Right handed   Drinks caffeine   One story home   Married lives with husband   retired   Chief Executive Officer Drivers of Corporate Investment Banker Strain: Low Risk  (06/03/2024)   Overall Financial Resource Strain (CARDIA)    Difficulty of Paying Living Expenses: Not very hard  Food Insecurity: Food Insecurity Present (06/03/2024)   Hunger Vital Sign    Worried About Running Out of Food in the Last Year: Sometimes true    Ran Out of Food in the Last Year: Never true  Transportation Needs: No Transportation Needs (06/03/2024)   PRAPARE - Administrator, Civil Service (Medical): No    Lack of Transportation (Non-Medical): No  Physical Activity: Inactive (06/03/2024)   Exercise Vital Sign    Days of Exercise per Week: 0 days    Minutes of Exercise per Session: Not on file  Stress: Stress Concern Present (06/03/2024)   [employer] of  Occupational Health - Occupational Stress Questionnaire    Feeling of Stress: Very much  Social Connections: Socially Integrated (06/03/2024)   Social Connection and Isolation Panel    Frequency of Communication  with Friends and Family: More than three times a week    Frequency of Social Gatherings with Friends and Family: Once a week    Attends Religious Services: More than 4 times per year    Active Member of [employer] or Organizations: Yes    Attends Engineer, Structural: More than 4 times per year    Marital Status: Married  Catering Manager Violence: Not At Risk (06/01/2023)   Humiliation, Afraid, Rape, and Kick questionnaire    Fear of Current or Ex-Partner: No    Emotionally Abused: No    Physically Abused: No    Sexually Abused: No    Past Medical History, Surgical history, Social history, and Family history were reviewed and updated as appropriate.   Please see review of systems for further details on the patient's review from today.   Objective:   Physical Exam:  There were no vitals taken for this visit.  Physical Exam Neurological:     Mental Status: She is alert and oriented to person, place, and time.     Cranial Nerves: No dysarthria.     Gait: Gait normal.     Comments: Lip licking consistent, and some pursing now minimal Good gait  Psychiatric:        Attention and Perception: Attention and perception normal.        Mood and Affect: Mood is anxious and depressed. Affect is not tearful.        Speech: Speech normal.        Behavior: Behavior is not slowed. Behavior is cooperative.        Thought Content: Thought content normal. Thought content is not paranoid or delusional. Thought content does not include homicidal or suicidal ideation. Thought content does not include suicidal plan.        Cognition and Memory: Cognition and memory normal.        Judgment: Judgment normal.     Comments: Insight intact Worsening dep     Lab Review:     Component Value Date/Time   NA 138 06/02/2024 1308   NA 142 03/22/2024 0814   K 3.3 (L) 06/02/2024 1308   CL 99 06/02/2024 1308   CO2 26 06/02/2024 1308   GLUCOSE 102 (H) 06/02/2024 1308   BUN 14 06/02/2024 1308   BUN  14 03/22/2024 0814   CREATININE 0.87 06/02/2024 1308   CALCIUM  9.2 06/02/2024 1308   PROT 7.2 06/02/2024 1308   PROT 6.8 03/22/2024 0814   ALBUMIN 4.0 06/02/2024 1308   ALBUMIN 4.5 03/22/2024 0814   AST 32 06/02/2024 1308   ALT 21 06/02/2024 1308   ALKPHOS 63 06/02/2024 1308   BILITOT 0.6 06/02/2024 1308   BILITOT 0.5 03/22/2024 0814   GFRNONAA >60 06/02/2024 1308   GFRAA 96 03/02/2020 1433       Component Value Date/Time   WBC 9.3 06/02/2024 1308   RBC 4.45 06/02/2024 1308   HGB 13.5 06/02/2024 1308   HGB 13.6 03/22/2024 0814   HCT 41.4 06/02/2024 1308   HCT 42.0 03/22/2024 0814   PLT 183 06/02/2024 1308   PLT 220 03/22/2024 0814   MCV 93.0 06/02/2024 1308   MCV 93 03/22/2024 0814   MCH 30.3 06/02/2024 1308   MCHC 32.6 06/02/2024 1308   RDW 14.3 06/02/2024  1308   RDW 12.7 03/22/2024 0814   LYMPHSABS 2.8 03/22/2024 0814   MONOABS 0.7 04/04/2019 1004   EOSABS 0.3 03/22/2024 0814   BASOSABS 0.0 03/22/2024 0814  Vitamin D  level 33 on 10K units daily on 12/4/`9 Increased to prescription vitamin d  50K units Monday, Wed, Friday.  Rx sent in.   Lithium  Lvl  Date Value Ref Range Status  10/21/2018 0.18 (L) 0.60 - 1.20 mmol/L Final    Comment:    Performed at [facility], [address] [place], [place] [address]     No results found for: PHENYTOIN, PHENOBARB, VALPROATE, CBMZ   .res Assessment: Plan:    Bipolar disorder with moderate depression (HCC) - Plan: pramipexole  (MIRAPEX ) 0.25 MG tablet  Generalized anxiety disorder  Attention deficit hyperactivity disorder (ADHD), predominantly inattentive type  Tardive dyskinesia - Plan: pramipexole  (MIRAPEX ) 0.25 MG tablet  Mild cognitive impairment  Insomnia due to mental condition  Long term current use of clozapine   Low vitamin D  level  30 min face to face time with patient.. We discussed multiple dxes and concerns.   Bryona has chronic rapid cycling bipolar disorder which is chronically unstable and has  been difficult to control.  Failed 14 different mood stabilizers.  The rapid cycling is making it difficult to control frequency of depressive episodes and the anxiety as well.  We have typically had to make frequent med changes.   Disc gradual increase bc med sensitivity.  Med sensitivity to SE seems to be the biggest problem preventing better mood control SX remain TR  Disc options for better control of dep, irritability and anxiety.  Incr Viibryd  vs clozapine  If she can tolerate it the most effective option would be to increase the clozapine  which could help depression, anxiety and irritability.  But at this time she does not believe she can tolerate a higher dose of clozapine .    Ingrezza  for TD helped reduce tongue chewing but still some mouth movements at 60 mg BID  for lip licking and pursing.  Shuffling only at night now.  Drooling resolved Stopped ingrezza  DT insurance won't cover BID anymore.  Austedo  XR 48 mg daily.  Still complaining of tongue and lip movements and mild grinding or biting tongue now.  Training and development officer.   Extensive discussion of clozapine  dosing recommendations but we will increase more slowly bc she is so med sensitive.Disc risk low WBC, cardiomyopathy, etc, sedation  (Started clozapine  on 12/07/21).    ON THIS OVER 2 YEARS.  PER fda CAN NOW STOP LABS EXCEPT AS NEEDED. For anxiety and irritability, reactivity , mood swings continue clozapine  but switch to 75 mg HS.  She coouldn't tolerate higher dose and so mood swings are worse.  Lately dealing with dep and anxiety but better the last couple of days. Likely to work better than incr Viibryd  but consider latter.  She has a high residual anxiety.  It has been impossible to control all of her symptoms simultaneously without causing side effects. Failed various meds.  Would like to avoid BZ if possible.  Lamotrigine  100 mg Am and 200 mg pm.    Failed all reasonable alternatives for anxiety.  Option Auvelity.  She is  under a lot of genuine stress. Disc ECT. Only FDA approved option left. She wants to defer.   Will try off label pramipexole  for both dep and mouth movements bc it can sometimes help TD like sx and should not at least make it worse which stimulants have done.  Discussed  potential metabolic side effects associated with atypical antipsychotics, as well as potential risk for movement side effects. Advised pt to contact office if movement side effects occur.    Checked B12 folate bc memory complaints.  Normal B12 & folate on 05/25/22  Disc SE meds and this is heightened by the complication of necessary polypharmacy.  Counseling: on stress management, isolation, dealing with H's drinking.  And dealing with his cancer.  He has prostate CA with mets.  Continue counseling . It helps.  Living situation is getting worse as H's health going downhill fast.  He's falling more and making bad decisions.  Trying to get H into CA study.   Requires frequent FU DT chronic instability.  Wants to schedule monthly.  Continue Viibryd  60 mg daily; If pramipexole  works, then reduce this to 40 mg daily.  Add pramipexole  off label for dep and TD at 0.25 mg BID.  Higher if needed.  FU 4 weeks.   Please see After Visit Summary for patient specific instructions.  Lorene Macintosh, MD, DFAPA     Future Appointments  Date Time Provider Department Center  07/08/2024  9:45 AM Trudy Duwaine BRAVO, NP [facility] None  07/11/2024 10:00 AM Sherlynn Sober, LCSW CP-CP None  07/15/2024 10:15 AM AP-MM 1 AP-MM [place] [facility]  07/16/2024 10:00 AM RPC-ANNUAL WELLNESS VISIT [facility] [address]  07/25/2024 10:00 AM Sherlynn Sober, LCSW CP-CP None  07/30/2024 10:00 AM Cottle, Lorene KANDICE Raddle., MD CP-CP None  08/08/2024 10:00 AM Sherlynn Sober, LCSW CP-CP None  08/13/2024 10:30 AM Cottle, Lorene KANDICE Raddle., MD CP-CP None  08/15/2024 10:20 AM Mollie Nestor HERO, PA-C [facility]  08/27/2024 10:00 AM Sherlynn Sober, LCSW CP-CP None  09/10/2024 10:00 AM  Sherlynn Sober, LCSW CP-CP None  09/12/2024 10:00 AM Cottle, Lorene KANDICE Raddle., MD CP-CP None  10/01/2024  3:00 PM Tobie Suzzane POUR, MD R[facility] [address]  10/14/2024 10:30 AM Cottle, Lorene KANDICE Raddle., MD CP-CP None    No orders of the defined types were placed in this encounter.      -------------------------------

## 2024-07-08 ENCOUNTER — Other Ambulatory Visit: Payer: Self-pay

## 2024-07-08 ENCOUNTER — Ambulatory Visit: Admitting: Physical Medicine and Rehabilitation

## 2024-07-08 ENCOUNTER — Encounter: Payer: Self-pay | Admitting: Radiology

## 2024-07-08 ENCOUNTER — Encounter: Payer: Self-pay | Admitting: Physical Medicine and Rehabilitation

## 2024-07-08 DIAGNOSIS — M546 Pain in thoracic spine: Secondary | ICD-10-CM

## 2024-07-08 DIAGNOSIS — M47814 Spondylosis without myelopathy or radiculopathy, thoracic region: Secondary | ICD-10-CM

## 2024-07-08 DIAGNOSIS — G8929 Other chronic pain: Secondary | ICD-10-CM

## 2024-07-08 DIAGNOSIS — M419 Scoliosis, unspecified: Secondary | ICD-10-CM

## 2024-07-08 NOTE — Progress Notes (Unsigned)
 Megan Whitney - 68 y.o. female MRN [id number]  Date of birth: Jan 19, 1956  Office Visit Note: Visit Date: 07/08/2024 PCP: Tobie Suzzane POUR, MD Referred by: Tobie Suzzane POUR, MD  Subjective: Chief Complaint  Patient presents with   Middle Back - Pain   HPI: Megan Whitney is a 68 y.o. female who comes in today for evaluation of chronic, worsening and severe bilateral thoracic back pain. Pain ongoing for about 1 year. 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 Her pain worsens with movement and activity. Her pain seems to increase throughout the day. She describes pain as sore and aching sensation, currently rates as 3 out of 10. Some relief of pain with home exercise regimen, rest and use of medications. History of formal physical therapy in the past with significant relief of pain. She was previously managed by Dr. Donaciano Sprang at [facility] for same issue. Thoracic radiographs from March of this year show severe scoliosis, multi level degenerative changes and chronic compression fracture. No history of thoracic surgery/injections. No prior MRI imaging of thoracic spine. Patient denies focal weakness, numbness and tingling. No recent trauma or falls.   Patients course is complicated by depression and bipolar disorder.      Review of Systems  Musculoskeletal:  Positive for back pain.  Neurological:  Negative for tingling, sensory change, focal weakness and weakness.  All other systems reviewed and are negative.  Otherwise per HPI.  Assessment & Plan: Visit Diagnoses:    ICD-10-CM   1. Chronic bilateral thoracic back pain  M54.6 XR Thoracic Spine 2 View   G89.29 MR THORACIC SPINE WO CONTRAST    2. Thoracic spondylosis  M47.814 MR THORACIC SPINE WO CONTRAST    3. Scoliosis of thoracic spine, unspecified scoliosis type  M41.9 MR THORACIC SPINE WO CONTRAST       Plan: Findings:  Chronic, worsening and severe bilateral thoracic back pain. No radicular symptoms noted. Patient  continues to have pain despite good conservative therapies such as formal physical therapy, home exercise regimen, rest and use of medications. I obtained thoracic x-rays in the office today that show multi level degenerative changes, there is severe curvature to the right. We discussed treatment plan in detail today. Given the chronicity of her symptoms and continued severe pain I placed order for thoracic MRI Imaging. We discussed other treatments such as bracing and pain management. I reached out to [place] with [facility] to discuss bracing and feel she would benefit from postural extension brace to hopefully reduce pain and provided needed support. I am concerned medication management would need to be done at a more comprehensive pain management practice as she does take several medications for bipolar disorder. She is not interested in interventional spine procedures at this time, she is fairly adamate about avoiding spine injections. We will see her back for thoracic MRI review. Could also look at re-grouping with formal physical therapy. Patient has no questions at this time. Her exam today is non focal, good strength noted to bilateral upper extremities.     Meds & Orders: No orders of the defined types were placed in this encounter.   Orders Placed This Encounter  Procedures   XR Thoracic Spine 2 View   MR THORACIC SPINE WO CONTRAST    Follow-up: Return for Thoracic MRI review.   Procedures: No procedures performed      Clinical History: EXAM: LUMBAR SPINE - COMPLETE 4+ VIEW   COMPARISON:  MR lumbar spine February 22, 2005  FINDINGS: There is no evidence of lumbar spine fracture. Mild curvature of spine. Minimal facet joint sclerosis in the lower lumbar spine. Minimal anterior spurring with narrow intervertebral spaces in lower thoracic and upper lumbar spine. Grade 1 anterolisthesis of L4 on L5.   IMPRESSION: Mild degenerative joint changes of lumbar spine.     Electronically  Signed   By: Craig Farr M.D.   On: 05/24/2023 10:23   She reports that she has never smoked. She has never used smokeless tobacco.  Recent Labs    03/22/24 0814  HGBA1C 5.3    Objective:  VS:  HT:    WT:   BMI:     BP:   HR: bpm  TEMP: ( )  RESP:  Physical Exam Vitals and nursing note reviewed.  HENT:     Head: Normocephalic and atraumatic.     Right Ear: External ear normal.     Left Ear: External ear normal.     Nose: Nose normal.     Mouth/Throat:     Mouth: Mucous membranes are moist.  Eyes:     Extraocular Movements: Extraocular movements intact.  Cardiovascular:     Rate and Rhythm: Normal rate.     Pulses: Normal pulses.  Pulmonary:     Effort: Pulmonary effort is normal.  Abdominal:     General: Abdomen is flat. There is no distension.  Musculoskeletal:        General: No tenderness.     Cervical back: Normal range of motion.     Comments: Obvious scoliotic deformity noted upon exam. No tenderness noted upon palpation of spinous processes. No step off deformity. No pain noted with flexion, extension, lateral bending and rotation. Good strength noted to bilateral upper extremities.    Skin:    General: Skin is warm and dry.     Capillary Refill: Capillary refill takes less than 2 seconds.  Neurological:     General: No focal deficit present.     Mental Status: She is alert and oriented to person, place, and time.  Psychiatric:        Mood and Affect: Mood normal.        Behavior: Behavior normal.     Ortho Exam  Imaging: No results found.   Past Medical/Family/Surgical/Social History: Medications & Allergies reviewed per EMR, new medications updated. Patient Active Problem List   Diagnosis Date Noted   Colitis 06/03/2024   Hypokalemia 06/03/2024   Elevated serum creatinine 03/27/2024   Neuromuscular disorder (HCC)    Attention deficit disorder 09/27/2023   Prediabetes 09/27/2023   DDD (degenerative disc disease), lumbar 03/27/2023   Class  1 obesity due to excess calories without serious comorbidity in adult 11/29/2022   Chronic thoracic back pain 09/22/2022   Neuroleptic-induced tardive dyskinesia 07/20/2022   Intertrigo 05/19/2022   Encounter for general adult medical examination with abnormal findings 03/22/2022   Arthritis of knee 08/17/2021   Polyp of colon 02/04/2021   Seasonal allergies 10/07/2020   Osteopenia 10/07/2020   Gait disturbance 10/07/2020   Hyperlipidemia 06/11/2020   Sleep apnea 06/11/2020   Stricture and stenosis of esophagus 05/16/2012   Hiatal hernia 05/16/2012   Dysphagia 05/15/2012   Depression    Bipolar disorder (HCC)    GERD (gastroesophageal reflux disease) 09/14/2010   History of colonic polyps 09/14/2010   Past Medical History:  Diagnosis Date   ADD (attention deficit disorder)    Allergy    Seasonal   Anemia    History  of GI blood loss   Anxiety    Arthritis    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    Esophageal stricture    Fracture of superior pubic ramus (HCC) 11/28/2018   GERD (gastroesophageal reflux disease)    Headache(784.0)    Hyperlipidemia    Interstitial cystitis    Joint pain    Lactose intolerance    Lung cancer (HCC) 2002   Neuromuscular disorder (HCC)    Obesity    Osteoarthritis    Osteoporosis    Palpitations    Sleep apnea    Doesn't use a CPAP   Suicidal ideation 01/20/2020   Swallowing difficulty    Tardive dyskinesia    Family History  Problem Relation Age of Onset   Arthritis Mother    Hearing loss Mother    Hyperlipidemia Mother    Hypertension Mother    Depression Mother    Anxiety disorder Mother    Obesity Mother    Sudden death Mother    Hypertension Father    Diabetes Mellitus II Father    Heart disease Father    Arthritis Father    Cancer Father        Brain   COPD  Father    Diabetes Father    Hyperlipidemia Father    Sleep apnea Father    Early death Sister        Aneroxia/Bulimic   Depression Brother    Early death Hydrographic Surveyor Accident   Stroke Maternal Grandmother    Hypertension Maternal Grandmother    Arthritis Maternal Grandfather    Heart attack Maternal Grandfather    Hearing loss Maternal Grandfather    Depression Daughter    Drug abuse Daughter    Heart disease Daughter    Hypertension Daughter    Colon cancer Neg Hx    Esophageal cancer Neg Hx    Rectal cancer Neg Hx    Colon polyps Neg Hx    Stomach cancer Neg Hx    Past Surgical History:  Procedure Laterality Date   BALLOON DILATION  05/16/2012   Procedure: BALLOON DILATION;  Surgeon: Lamar JONETTA Aho, MD;  Location: [facility] ENDOSCOPY;  Service: Endoscopy;  Laterality: N/A;   BUNIONECTOMY  2011   COLONOSCOPY     ENTEROSCOPY  05/16/2012   Procedure: ENTEROSCOPY;  Surgeon: Lamar JONETTA Aho, MD;  Location: [facility] ENDOSCOPY;  Service: Endoscopy;  Laterality: N/A;   JOINT REPLACEMENT     right shoulder durgery 25 yrs ago  1988   TOTAL HIP ARTHROPLASTY Bilateral 2006, 2008   bilateral   TUBAL LIGATION  1990   WEDGE RESECTION  2002   lung cancer   Social History   Occupational History   Occupation: retired  Tobacco Use   Smoking status: Never   Smokeless tobacco: Never  Vaping Use   Vaping status: Never Used  Substance and Sexual Activity   Alcohol use: Not Currently    Alcohol/week: 1.0 standard drink of alcohol    Types: 1 Glasses of wine per week    Comment: 1 glass wine occassionally   Drug use: No   Sexual activity: Yes

## 2024-07-08 NOTE — Progress Notes (Unsigned)
 Pain Scale   Average Pain 8 Patient advising she has middle back pain that get worse when walking and standing. Pain lessens when sitting and she uses heat.        +Driver, -BT, -Dye Allergies.

## 2024-07-10 ENCOUNTER — Telehealth: Payer: Self-pay

## 2024-07-10 ENCOUNTER — Other Ambulatory Visit: Payer: Self-pay

## 2024-07-10 ENCOUNTER — Ambulatory Visit: Admitting: Psychiatry

## 2024-07-10 DIAGNOSIS — F3132 Bipolar disorder, current episode depressed, moderate: Secondary | ICD-10-CM

## 2024-07-10 DIAGNOSIS — F411 Generalized anxiety disorder: Secondary | ICD-10-CM

## 2024-07-10 MED ORDER — VILAZODONE HCL 40 MG PO TABS: 40.0000 mg | ORAL_TABLET | Freq: Every day | ORAL | 0 refills | Status: DC

## 2024-07-10 MED ORDER — VILAZODONE HCL 20 MG PO TABS: ORAL_TABLET | ORAL | 0 refills | Status: DC

## 2024-07-10 NOTE — Telephone Encounter (Signed)
 Scheduled for MRI review with Duwaine Pouch, NP

## 2024-07-11 ENCOUNTER — Ambulatory Visit (INDEPENDENT_AMBULATORY_CARE_PROVIDER_SITE_OTHER): Admitting: Psychiatry

## 2024-07-11 DIAGNOSIS — F3132 Bipolar disorder, current episode depressed, moderate: Secondary | ICD-10-CM | POA: Diagnosis not present

## 2024-07-11 NOTE — Progress Notes (Signed)
 [facility] Counselor/Therapist Progress Note  Patient ID: Megan Whitney, MRN: [id number],    Date: 07/11/2024  Time Spent: 55 minutes   Treatment Type: Individual Therapy  Reported Symptoms: big time anxiety, agitation at husband and everyday things, some bipolar, anger at husband but I would    Mental Status Exam:  Appearance:   Casual and Neat     Behavior:  Appropriate and Sharing  Motor:  Does walk but sometimes unsteady with [place]  Speech/Language:   Clear and Coherent  Affect:  Depressed and anxious  Mood:  anxious and depressed  Thought process:  goal directed  Thought content:    Rumination  Sensory/Perceptual disturbances:    WNL  Orientation:  oriented to person, place, time/date, situation, day of week, month of year, year, and stated date of Nov. 6, 2025  Attention:  Good  Concentration:  Good and Fair  Memory:  WNL  Fund of knowledge:   Good  Insight:    Fair  Judgment:   Fair  Impulse Control:  Fair   Risk Assessment: Danger to Self:  No Self-injurious Behavior: No Danger to Others: No Duty to Warn:no Physical Aggression / Violence:No  Access to Firearms a concern: No  Gang Involvement:No   Subjective: Patient today reporting anxiety, anger at husband, irritable sometimes, some bipolar, some improvement in motivation. Has made a change in her bedtime to 9:30pm and states that is helping some with her sleep. Also taking her own self off of sugar and now uses sweet'n'low which has helped me not have as much brain fog. Got out 2 days ago with a friend and went to lunch which was supportive for patient. Working on getting connected more again with her knitting group and met up with them recently and enjoyed that time together. Encouraged patient in her connecting more with others outside the home. [facility] with [facility] [place] [facility] close to where patient lives that might be another good place for her to check out as she  is interested in more activities outside her home, and did follow-up on a couple other suggestions earlier.  Has had a little more contact with other people recently and I think that has also helped her mood.  She does have several friends and extended family that is in touch with her often and have encouraged patient to continue to have contact with them.  Focusing more on some self-care strategies as noted in session today.  Denies any thoughts of self-harm, and trying to believe more in herself, and feels that getting together with people more would be helpful to her.  On some occasions she is better at following through and I have encouraged her to be more mindful of this and make that effort because her pattern has been that when she has more contact with friends, she does feel better and connected.   Interventions: Cognitive Behavioral Therapy, Solution-Oriented/Positive Psychology, and Ego-Supportive Long Term Goal: Reduce overall level, frequency, and intensity of the anxiety so that daily functioning is not impaired. Short Term Goal: 1.Increase understanding of the beliefs and messages that produce the worry and anxiety. Strategies: 1.Help client develop reality-based positive cognitive messages/self-talk. 2. Develop a coping card or other reminder which coping strategies are recorded for patient's later use    Diagnosis:   ICD-10-CM   1. Bipolar disorder with moderate depression (HCC)  F31.32      Plan: Patient today in session working further on  her anxiety, depression, anger, and some irritability at times but reports that is not as bad currently.  Continued work on her mixed feelings of anger and frustration in session today and was very actively involved in talking.  No tearfulness today.  Has had a little more people contact and offers from friends to get together.  Continues to work on her issues regarding husband's cancer.  Some improved self-confidence at times.  States she is  trying to take breaks as needed or wanted.  Responsive in session today which seemed a little motivating for patient. Further work with patient and encouraging her to work on better care of herself and setting healthier limits with others as needed, increasing/improving her own self-care, letting go of guilt, letting others be supportive of her, using good judgment, making decisions and thinking them through rather than being impulsive, refrain from self sabotage, seeing her strengths and her positives more quickly, engage frequently with her 2 dogs that are very therapeutic for her, and realize the strength she can show as she works with goal-directed behaviors to move in a direction that supports her overall physical/emotional health and her outlook into the future.  Patient can recognize some progress and is needing to work more on holding onto that progress which could help her also and interacting with husband and when she is alone, more effectively.  Megan Whitney is trying to hold on to any progress that she senses and desires to be able to move forward in a healthier more consistent direction into the future.  Is working on not automatically assuming worst-case scenarios.  Goal review and progress/challenges noted with patient.  Next appointment within 1-2 weeks.   Barnie Bunde, LCSW

## 2024-07-15 ENCOUNTER — Ambulatory Visit (HOSPITAL_COMMUNITY)
Admission: RE | Admit: 2024-07-15 | Discharge: 2024-07-15 | Disposition: A | Discharge status: Home / Self Care | Source: Ambulatory Visit | Attending: Internal Medicine | Admitting: Internal Medicine

## 2024-07-15 DIAGNOSIS — Z1231 Encounter for screening mammogram for malignant neoplasm of breast: Secondary | ICD-10-CM | POA: Insufficient documentation

## 2024-07-16 ENCOUNTER — Ambulatory Visit (INDEPENDENT_AMBULATORY_CARE_PROVIDER_SITE_OTHER)

## 2024-07-16 ENCOUNTER — Telehealth: Payer: Self-pay | Admitting: Psychiatry

## 2024-07-16 VITALS — Ht 60.0 in | Wt 123.0 lb

## 2024-07-16 DIAGNOSIS — Z780 Asymptomatic menopausal state: Secondary | ICD-10-CM | POA: Diagnosis not present

## 2024-07-16 DIAGNOSIS — Z0000 Encounter for general adult medical examination without abnormal findings: Secondary | ICD-10-CM | POA: Diagnosis not present

## 2024-07-16 NOTE — Progress Notes (Unsigned)
 Subjective:   Megan Whitney is a 68 y.o. female who presents for a Medicare Annual Wellness Visit.  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    Esophageal stricture    Fracture of superior pubic ramus (HCC) 11/28/2018   GERD (gastroesophageal reflux disease)    Headache(784.0)    Hyperlipidemia    Interstitial cystitis    Joint pain    Lactose intolerance    Lung cancer (HCC) 2002   Neuromuscular disorder (HCC)    Obesity    Osteoarthritis    Osteoporosis    Palpitations    Sleep apnea    Doesn't use a CPAP   Suicidal ideation 01/20/2020   Swallowing difficulty    Tardive dyskinesia    Past Surgical History:  Procedure Laterality Date   BALLOON DILATION  05/16/2012   Procedure: BALLOON DILATION;  Surgeon: Lamar JONETTA Aho, MD;  Location: [facility];  Service: Endoscopy;  Laterality: N/A;   BUNIONECTOMY  2011   COLONOSCOPY     ENTEROSCOPY  05/16/2012   Procedure: ENTEROSCOPY;  Surgeon: Lamar JONETTA Aho, MD;  Location: [facility];  Service: Endoscopy;  Laterality: N/A;   JOINT REPLACEMENT     right shoulder durgery 25 yrs ago  1988   TOTAL HIP ARTHROPLASTY Bilateral 2006, 2008   bilateral   TUBAL LIGATION  1990   WEDGE RESECTION  2002   lung cancer   Family History  Problem Relation Age of Onset   Arthritis Mother    Hearing loss Mother    Hyperlipidemia Mother    Hypertension Mother    Depression Mother    Anxiety disorder Mother    Obesity Mother    Sudden death Mother    Hypertension Father    Diabetes Mellitus II Father    Heart disease Father     Arthritis Father    Cancer Father        Brain   COPD Father    Diabetes Father    Hyperlipidemia Father    Sleep apnea Father    Early death Sister        Aneroxia/Bulimic   Depression Brother    Early death Hydrographic [employer]or Accident   Stroke Maternal Grandmother    Hypertension Maternal Grandmother    Arthritis Maternal Grandfather    Heart attack Maternal Grandfather    Hearing loss Maternal Grandfather    Depression Daughter    Drug abuse Daughter    Heart disease Daughter    Hypertension Daughter    Colon cancer Neg Hx    Esophageal cancer Neg Hx    Rectal cancer Neg Hx    Colon polyps Neg Hx    Stomach cancer Neg Hx    Social History   Occupational History   Occupation: retired  Tobacco Use   Smoking status: Never   Smokeless tobacco: Never  Vaping Use   Vaping status: Never Used  Substance and Sexual Activity  Alcohol use: Not Currently    Alcohol/week: 1.0 standard drink of alcohol    Types: 1 Glasses of wine per week    Comment: 1 glass wine occassionally   Drug use: No   Sexual activity: Yes   Tobacco Counseling Counseling given: Yes  SDOH Screenings   Food Insecurity: No Food Insecurity (07/17/2024)  Recent Concern: Food Insecurity - Food Insecurity Present (06/03/2024)  Housing: Low Risk  (07/17/2024)  Transportation Needs: No Transportation Needs (07/17/2024)  Utilities: Not At Risk (07/17/2024)  Alcohol Screen: Low Risk  (06/03/2024)  Depression (PHQ2-9): Low Risk  (06/03/2024)  Financial Resource Strain: Low Risk  (06/03/2024)  Physical Activity: Inactive (07/17/2024)  Social Connections: Socially Integrated (07/17/2024)  Stress: Stress Concern Present (07/17/2024)  Tobacco Use: Low Risk  (07/16/2024)  Health Literacy: Adequate Health Literacy (07/17/2024)   Depression Screen    06/03/2024    1:48 PM 03/27/2024    2:26 PM 09/27/2023    1:01 PM 06/01/2023    1:28 PM 03/27/2023    1:14 PM 11/28/2022    3:46 PM 09/22/2022    1:43 PM  PHQ  2/9 Scores  PHQ - 2 Score 0 0 4 4 6 4 5   PHQ- 9 Score 0  0  12  14  19  16  15       Data saved with a previous flowsheet row definition     Goals Addressed               This Visit's Progress     I want to increase my activity (pt-stated)         Visit info / Clinical Intake: Medicare Wellness Visit Type:: Subsequent Annual Wellness Visit Persons participating in visit:: patient Medicare Wellness Visit Mode:: Video Because this visit was a virtual/telehealth visit:: pt reported vitals If Telephone or Video please confirm:: I connected with the patient using audio enabled telemedicine application and verified that I am speaking with the correct person using two identifiers; I discussed the limitations of evaluation and management by telemedicine; The patient expressed understanding and agreed to proceed Patient Location:: home Provider Location:: office Information given by:: patient Interpreter Needed?: No Pre-visit prep was completed: yes AWV questionnaire completed by patient prior to visit?: no Living arrangements:: lives with spouse/significant other Patient's Overall Health Status Rating: good Typical amount of pain: none Does pain affect daily life?: no Are you currently prescribed opioids?: no  Dietary Habits and Nutritional Risks How many meals a day?: 3 Eats fruit and vegetables daily?: yes Most meals are obtained by: preparing own meals In the last 2 weeks, have you had any of the following?: none Diabetic:: no  Functional Status Activities of Daily Living (to include ambulation/medication): Independent Ambulation: Independent Medication Administration: Independent Home Management: Independent Manage your own finances?: yes Primary transportation is: driving Concerns about vision?: no *vision screening is required for WTM* Concerns about hearing?: no  Fall Screening Falls in the past year?: 0 Number of falls in past year: 0 Was there an injury with  Fall?: 0 Fall Risk Category Calculator: 0 Patient Fall Risk Level: Low Fall Risk  Fall Risk Patient at Risk for Falls Due to: No Fall Risks Fall risk Follow up: Falls evaluation completed; Education provided; Falls prevention discussed  Home and Transportation Safety: All rugs have non-skid backing?: N/A, no rugs All stairs or steps have railings?: yes Grab bars in the bathtub or shower?: yes Have non-skid surface in bathtub or shower?: yes Good home lighting?: yes Regular  seat belt use?: yes Hospital stays in the last year:: (!) yes How many hospital stays:: 2 Reason: 1st admission: irregular heartbeats, 2nd admission: colitis  Cognitive Assessment Difficulty concentrating, remembering, or making decisions? : no Will 6CIT or Mini Cog be Completed: no 6CIT or Mini Cog Declined: patient alert, oriented, able to answer questions appropriately and recall recent events  Advance Directives (For Healthcare) Does Patient Have a Medical Advance Directive?: Yes Type of Advance Directive: Healthcare Power of [place]; Living will Copy of Healthcare Power of Attorney in Chart?: No - copy requested Copy of Living Will in Chart?: No - copy requested Would patient like information on creating a medical advance directive?: No - Patient declined  Reviewed/Updated  Reviewed/Updated: Reviewed All (Medical, Surgical, Family, Medications, Allergies, Care Teams, Patient Goals)        Objective:    Today's Vitals   07/16/24 1004  Weight: 123 lb (55.8 kg)  Height: 5' (1.524 m)   Body mass index is 24.02 kg/m.  Current Medications (verified) Outpatient Encounter Medications as of 07/16/2024  Medication Sig   acetaminophen  (TYLENOL ) 650 MG CR tablet Take 1,300 mg by mouth as needed for pain.   atorvastatin  (LIPITOR) 20 MG tablet TAKE 1 TABLET EACH EVENING.   AUSTEDO  XR 48 MG TB24 TAKE 1 TABLET BY MOUTH EVERY DAY   cloZAPine  (CLOZARIL ) 25 MG tablet Take 3 tablets (75 mg total) by mouth at  bedtime.   Ferrous Gluconate  (IRON  27 PO) Take by mouth.   lamoTRIgine  (LAMICTAL ) 100 MG tablet Take 1 tablet (100 mg) in the am and take 2 tablets (200 mg) in the pm   Multiple Vitamin (MULTIVITAMIN ADULT PO) Take by mouth.   pantoprazole  (PROTONIX ) 40 MG tablet Take 1 tablet (40 mg total) by mouth 2 (two) times daily. Office visit for further refills   pramipexole  (MIRAPEX ) 0.25 MG tablet Take 1 tablet (0.25 mg total) by mouth in the morning and at bedtime.   senna (SENOKOT) 8.6 MG tablet Take 1 tablet by mouth daily.   Vilazodone  HCl (VIIBRYD ) 40 MG TABS Take 1 tablet (40 mg total) by mouth daily.   Vilazodone  HCl 20 MG TABS Take 1 tablet (20 mg) by mouth daily along with a 40 mg tablet.   modafinil  (PROVIGIL ) 100 MG tablet Take 0.5 tablets (50 mg total) by mouth every morning. (Patient not taking: Reported on 07/16/2024)   [DISCONTINUED] clindamycin (CLEOCIN) 300 MG capsule Take 1 capsule (300 mg total) by mouth 3 (three) times daily. (Patient not taking: Reported on 07/16/2024)   [DISCONTINUED] fluconazole (DIFLUCAN) 150 MG tablet Take 1 tablet (150 mg total) by mouth every 3 (three) days. Take second and third dose only if persistent symptoms.   [DISCONTINUED] ketoconazole  (NIZORAL ) 2 % cream Apply 1 Application topically daily.   No facility-administered encounter medications on file as of 07/16/2024.   Hearing/Vision screen Hearing Screening - Comments:: Patient states she does have difficulty hearing bilaterally but declines a referral for testing today. Vision Screening - Comments:: Wears rx glasses - up to date with routine eye exams with  Oneil Kawasaki  Immunizations and Health Maintenance Health Maintenance  Topic Date Due   Influenza Vaccine  04/05/2024   COVID-19 Vaccine (4 - 2025-26 season) 05/06/2024   DEXA SCAN  07/08/2024   Mammogram  07/15/2025   Medicare Annual Wellness (AWV)  07/16/2025   DTaP/Tdap/Td (4 - Td or Tdap) 09/05/2028   Colonoscopy  11/16/2028    Pneumococcal Vaccine: 50+ Years  Completed   Hepatitis  C Screening  Completed   Zoster Vaccines- Shingrix  Completed   Meningococcal B Vaccine  Aged Out        Assessment/Plan:  This is a routine wellness examination for Megan Whitney.  Patient Care Team: Tobie Suzzane POUR, MD as PCP - General (Internal Medicine) Abran Norleen SAILOR, MD as Consulting Physician (Gastroenterology) Cottle, Lorene KANDICE Raddle., MD as Attending Physician (Psychiatry) Sherlynn Sober, LCSW as Social Worker (Psychiatry) Kennard Lorane PARAS, FNP as Consulting Physician (Obstetrics and Gynecology) Timmie Norris, MD as Attending Physician (Obstetrics and Gynecology)  I have personally reviewed and noted the following in the patient's chart:   Medical and social history Use of alcohol, tobacco or illicit drugs  Current medications and supplements including opioid prescriptions. Functional ability and status Nutritional status Physical activity Advanced directives List of other physicians Hospitalizations, surgeries, and ER visits in previous 12 months Vitals Screenings to include cognitive, depression, and falls Referrals and appointments  Orders Placed This Encounter  Procedures   DG Bone Density    Standing Status:   Future    Expiration Date:   07/16/2025    Reason for Exam (SYMPTOM  OR DIAGNOSIS REQUIRED):   post menopausal estrogen deficient    Preferred imaging location?:   [facility]   In addition, I have reviewed and discussed with patient certain preventive protocols, quality metrics, and best practice recommendations. A written personalized care plan for preventive services as well as general preventive health recommendations were provided to patient.   Kennedi Lizardo, CMA   07/17/2024   Return on Friday July 18, 2025 at 9:20 am in the office, for your yearly Medicare Wellness Visit.  After Visit Summary: (MyChart) Due to this being a telephonic visit, the after visit summary with patients  personalized plan was offered to patient via MyChart   Nurse Notes: appointment scheduled with provider for 07/23/24 for intermittent weakness and sob

## 2024-07-16 NOTE — Telephone Encounter (Signed)
 Megan Whitney called and said that the new medication is not working. She said that she is still very depressed and that she wants to know if new medicine can be increased or changed. Please call her at [phone]

## 2024-07-17 NOTE — Patient Instructions (Signed)
 Ms. Gardiner,  Thank you for taking the time for your Medicare Wellness Visit. I appreciate your continued commitment to your health goals. Please review the care plan we discussed, and feel free to reach out if I can assist you further.  Please note that Annual Wellness Visits do not include a physical exam. Some assessments may be limited, especially if the visit was conducted virtually. If needed, we may recommend an in-person follow-up with your provider.  Ongoing Care Seeing your primary care provider every 3 to 6 months helps us  monitor your health and provide consistent, personalized care.   Referrals If a referral was made during today's visit and you haven't received any updates within two weeks, please contact the referred provider directly to check on the status.  Osteoporosis Screening An order was placed for you to have your Osteoporosis Screening. Call the number below to schedule that [facility]  [phone]   Orders Placed This Encounter  Procedures   DG Bone Density    Standing Status:   Future    Expiration Date:   07/16/2025    Reason for Exam (SYMPTOM  OR DIAGNOSIS REQUIRED):   post menopausal estrogen deficient    Preferred imaging location?:   [facility]    No orders of the defined types were placed in this encounter.   Recommended Screenings:  Health Maintenance  Topic Date Due   Flu Shot  04/05/2024   COVID-19 Vaccine (4 - 2025-26 season) 05/06/2024   DEXA scan (bone density measurement)  07/08/2024   Breast Cancer Screening  07/15/2025   Medicare Annual Wellness Visit  07/16/2025   DTaP/Tdap/Td vaccine (4 - Td or Tdap) 09/05/2028   Colon Cancer Screening  11/16/2028   Pneumococcal Vaccine for age over 27  Completed   Hepatitis C Screening  Completed   Zoster (Shingles) Vaccine  Completed   Meningitis B Vaccine  Aged Out       07/16/2024   10:16 AM  Advanced Directives  Does Patient Have a Medical Advance Directive? Yes     Vision: Annual vision screenings are recommended for early detection of glaucoma, cataracts, and diabetic retinopathy. These exams can also reveal signs of chronic conditions such as diabetes and high blood pressure.  Dental: Annual dental screenings help detect early signs of oral cancer, gum disease, and other conditions linked to overall health, including heart disease and diabetes.  Please see the attached documents for additional preventive care recommendations.

## 2024-07-17 NOTE — Telephone Encounter (Signed)
 Called patient and she reported being in a [place] with a lot of other people, asking if she could call back, told her that was fine.

## 2024-07-18 NOTE — Telephone Encounter (Signed)
 Increase pramipexole  to 2 of the 0.25 mg tablets twice daily for a week and then let us  know.

## 2024-07-18 NOTE — Telephone Encounter (Signed)
 On 10/27 patient was started on pramipexole  0.25 mg. She reports it is not helping with her depression. She is reporting no energy, sits around, not keeping up with household chores. No new stressors. When asked about SI she paused for several seconds and then reported passive SI. No new stressors. Told her she was on a low dose and dose could be increased.

## 2024-07-19 ENCOUNTER — Ambulatory Visit

## 2024-07-19 NOTE — Telephone Encounter (Signed)
 Reviewed recommendations with patient. She has enough medication to increase as currently recommended until time to FU. Told her we would send new RX as appropriate next week.

## 2024-07-19 NOTE — Telephone Encounter (Signed)
 Pt called this AM and left message for me to [facility]. Called and got VM, left message.

## 2024-07-23 ENCOUNTER — Ambulatory Visit: Admitting: Internal Medicine

## 2024-07-23 ENCOUNTER — Telehealth: Payer: Self-pay | Admitting: Psychiatry

## 2024-07-23 ENCOUNTER — Other Ambulatory Visit: Payer: Self-pay

## 2024-07-23 MED ORDER — PRAMIPEXOLE DIHYDROCHLORIDE 0.5 MG PO TABS: 0.5000 mg | ORAL_TABLET | Freq: Two times a day (BID) | ORAL | 0 refills | Status: DC

## 2024-07-23 NOTE — Telephone Encounter (Signed)
 Pt reported 0.25 mg pramipexole  2 BID was working well. She asked for a RF. Sent in new Rx for 0.5 mg BID to [facility]. Patient aware of the dose increase and she should take 1 tablet BID.

## 2024-07-23 NOTE — Telephone Encounter (Signed)
 Pt called requesting call back from [place]  about new med. [phone]

## 2024-07-24 ENCOUNTER — Ambulatory Visit: Admission: EM | Admit: 2024-07-24 | Discharge: 2024-07-24 | Disposition: A | Discharge status: Home / Self Care

## 2024-07-24 ENCOUNTER — Encounter: Payer: Self-pay | Admitting: Emergency Medicine

## 2024-07-24 DIAGNOSIS — R200 Anesthesia of skin: Secondary | ICD-10-CM

## 2024-07-24 DIAGNOSIS — S0003XA Contusion of scalp, initial encounter: Secondary | ICD-10-CM

## 2024-07-24 DIAGNOSIS — R6884 Jaw pain: Secondary | ICD-10-CM

## 2024-07-24 NOTE — ED Provider Notes (Addendum)
 [facility]    CSN: [id number] Arrival date & time: 07/24/24  0957      History   Chief Complaint No chief complaint on file.   HPI Megan Whitney is a 68 y.o. female.   Patient presenting today with pain to the right side of head after a fall earlier this morning where she hit her head on the windowsill.  She denies loss of consciousness, loss of vision, worst headache of life, weakness numbness or tingling of extremities, mental status changes, speech or swallowing changes, nausea or vomiting but notes that she cannot fully open her jaw and has severe right sided jaw pain and pain and swelling at the site of the impact just beside this area as well as tingling sensation to this area.  Not currently on any anticoagulants.  So far trying over-the-counter remedies with minimal relief.    Past Medical History:  Diagnosis Date   ADD (attention deficit disorder)    Allergy    Seasonal   Anemia    History of GI blood loss   Anxiety    Arthritis    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    Esophageal stricture    Fracture of superior pubic ramus (HCC) 11/28/2018   GERD (gastroesophageal reflux disease)    Headache(784.0)    Hyperlipidemia    Interstitial cystitis    Joint pain    Lactose intolerance    Lung cancer (HCC) 2002   Neuromuscular disorder (HCC)    Obesity    Osteoarthritis    Osteoporosis    Palpitations    Sleep apnea    Doesn't use a CPAP   Suicidal ideation 01/20/2020   Swallowing difficulty    Tardive dyskinesia     Patient Active Problem List   Diagnosis Date Noted   Colitis 06/03/2024   Hypokalemia 06/03/2024   Elevated serum creatinine 03/27/2024   Neuromuscular disorder (HCC)    Attention deficit disorder 09/27/2023   Prediabetes 09/27/2023   DDD (degenerative disc disease),  lumbar 03/27/2023   Class 1 obesity due to excess calories without serious comorbidity in adult 11/29/2022   Chronic thoracic back pain 09/22/2022   Neuroleptic-induced tardive dyskinesia 07/20/2022   Intertrigo 05/19/2022   Encounter for general adult medical examination with abnormal findings 03/22/2022   Arthritis of knee 08/17/2021   Polyp of colon 02/04/2021   Seasonal allergies 10/07/2020   Osteopenia 10/07/2020   Gait disturbance 10/07/2020   Hyperlipidemia 06/11/2020   Sleep apnea 06/11/2020   Stricture and stenosis of esophagus 05/16/2012   Hiatal hernia 05/16/2012   Dysphagia 05/15/2012   Depression    Bipolar disorder (HCC)    GERD (gastroesophageal reflux disease) 09/14/2010   History of colonic polyps 09/14/2010    Past Surgical History:  Procedure Laterality Date   BALLOON DILATION  05/16/2012   Procedure: BALLOON DILATION;  Surgeon: Lamar JONETTA Aho, MD;  Location: [facility] ENDOSCOPY;  Service: Endoscopy;  Laterality: N/A;   BUNIONECTOMY  2011   COLONOSCOPY     ENTEROSCOPY  05/16/2012   Procedure: ENTEROSCOPY;  Surgeon: Lamar JONETTA Aho, MD;  Location: [facility] ENDOSCOPY;  Service: Endoscopy;  Laterality: N/A;   JOINT REPLACEMENT     right shoulder durgery 25 yrs ago  1988   TOTAL HIP ARTHROPLASTY  Bilateral 2006, 2008   bilateral   TUBAL LIGATION  1990   WEDGE RESECTION  2002   lung cancer    OB History     Gravida  1   Para      Term      Preterm      AB      Living         SAB      IAB      Ectopic      Multiple      Live Births               Home Medications    Prior to Admission medications   Medication Sig Start Date End Date Taking? Authorizing Provider  acetaminophen  (TYLENOL ) 650 MG CR tablet Take 1,300 mg by mouth as needed for pain.    [provider]  atorvastatin  (LIPITOR) 20 MG tablet TAKE 1 TABLET EACH EVENING. 12/26/23   Tobie Suzzane POUR, MD  AUSTEDO  XR 48 MG TB24 TAKE 1 TABLET BY MOUTH EVERY DAY 05/01/24   Cottle,  Lorene KANDICE Raddle., MD  cloZAPine  (CLOZARIL ) 25 MG tablet Take 3 tablets (75 mg total) by mouth at bedtime. 05/08/24   Cottle, Lorene KANDICE Raddle., MD  Ferrous Gluconate  (IRON  27 PO) Take by mouth.    [provider]  lamoTRIgine  (LAMICTAL ) 100 MG tablet Take 1 tablet (100 mg) in the am and take 2 tablets (200 mg) in the pm 04/08/24   Cottle, Lorene KANDICE Raddle., MD  modafinil  (PROVIGIL ) 100 MG tablet Take 0.5 tablets (50 mg total) by mouth every morning. Patient not taking: Reported on 07/16/2024 05/08/24   Geoffry Lorene KANDICE Raddle., MD  Multiple Vitamin (MULTIVITAMIN ADULT PO) Take by mouth.    [provider]  pantoprazole  (PROTONIX ) 40 MG tablet Take 1 tablet (40 mg total) by mouth 2 (two) times daily. Office visit for further refills 04/29/24   Abran Norleen SAILOR, MD  pramipexole  (MIRAPEX ) 0.5 MG tablet Take 1 tablet (0.5 mg total) by mouth 2 (two) times daily. 07/23/24   Cottle, Lorene KANDICE Raddle., MD  senna (SENOKOT) 8.6 MG tablet Take 1 tablet by mouth daily.    [provider]  Vilazodone  HCl (VIIBRYD ) 40 MG TABS Take 1 tablet (40 mg total) by mouth daily. 07/10/24   Cottle, Lorene KANDICE Raddle., MD  Vilazodone  HCl 20 MG TABS Take 1 tablet (20 mg) by mouth daily along with a 40 mg tablet. 07/10/24   Cottle, Lorene KANDICE Raddle., MD    Family History Family History  Problem Relation Age of Onset   Arthritis Mother    Hearing loss Mother    Hyperlipidemia Mother    Hypertension Mother    Depression Mother    Anxiety disorder Mother    Obesity Mother    Sudden death Mother    Hypertension Father    Diabetes Mellitus II Father    Heart disease Father    Arthritis Father    Cancer Father        Brain   COPD Father    Diabetes Father    Hyperlipidemia Father    Sleep apnea Father    Early death Sister        Aneroxia/Bulimic   Depression Brother    Early death [employer] Accident   Stroke Maternal Grandmother    Hypertension Maternal Grandmother    Arthritis Maternal Grandfather    Heart attack Maternal  Grandfather    Hearing loss Maternal Grandfather    Depression Daughter    Drug abuse Daughter    Heart disease Daughter    Hypertension Daughter    Colon cancer Neg Hx    Esophageal cancer Neg Hx    Rectal cancer Neg Hx    Colon polyps Neg Hx    Stomach cancer Neg Hx     Social History Social History   Tobacco Use   Smoking status: Never   Smokeless tobacco: Never  Vaping Use   Vaping status: Never Used  Substance Use Topics   Alcohol use: Not Currently    Alcohol/week: 1.0 standard drink of alcohol    Types: 1 Glasses of wine per week    Comment: 1 glass wine occassionally   Drug use: No     Allergies   Azithromycin, Penicillins, and Adhesive [tape]   Review of Systems Review of Systems PER HPI  Physical Exam Triage Vital Signs ED Triage Vitals  Encounter Vitals Group     BP 07/24/24 1006 117/77     Girls Systolic BP Percentile --      Girls Diastolic BP Percentile --      Boys Systolic BP Percentile --      Boys Diastolic BP Percentile --      Pulse Rate 07/24/24 1006 (!) 106     Resp 07/24/24 1006 18     Temp 07/24/24 1006 98.7 F (37.1 C)     Temp Source 07/24/24 1006 Oral     SpO2 07/24/24 1006 94 %     Weight --      Height --      Head Circumference --      Peak Flow --      Pain Score 07/24/24 1007 3     Pain Loc --      Pain Education --      Exclude from Growth Chart --    No data found.  Updated Vital Signs BP 117/77 (BP Location: Right Arm)   Pulse (!) 106   Temp 98.7 F (37.1 C) (Oral)   Resp 18   SpO2 94%   Visual Acuity Right Eye Distance:   Left Eye Distance:   Bilateral Distance:    Right Eye Near:   Left Eye Near:    Bilateral Near:     Physical Exam Vitals and nursing note reviewed.  Constitutional:      Appearance: Normal appearance. She is not ill-appearing.  HENT:     Nose: Nose normal.     Mouth/Throat:     Mouth: Mucous membranes are moist.  Eyes:     Extraocular Movements: Extraocular movements intact.      Conjunctiva/sclera: Conjunctivae normal.     Pupils: Pupils are equal, round, and reactive to light.     Comments: EOMs intact bilaterally on exam  Cardiovascular:     Rate and Rhythm: Normal rate.  Pulmonary:     Effort: Pulmonary effort is normal.  Musculoskeletal:        General: Normal range of motion.     Cervical back: Normal range of motion and neck supple.     Comments: Significant tenderness to palpation over the right TMJ, unable to fully extend the jaw due to pain on exam.  Significant contusion to the right temple in the site of impact, no obvious bony step-offs to the site  Skin:    General: Skin is warm and dry.     Findings: Bruising  present.  Neurological:     General: No focal deficit present.     Mental Status: She is alert and oriented to person, place, and time.     Cranial Nerves: No cranial nerve deficit.     Motor: No weakness.  Psychiatric:        Mood and Affect: Mood normal.        Thought Content: Thought content normal.        Judgment: Judgment normal.      UC Treatments / Results  Labs (all labs ordered are listed, but only abnormal results are displayed) Labs Reviewed - No data to display  EKG   Radiology No results found.  Procedures Procedures (including critical care time)  Medications Ordered in UC Medications - No data to display  Initial Impression / Assessment and Plan / UC Course  I have reviewed the triage vital signs and the nursing notes.  Pertinent labs & imaging results that were available during my care of the patient were reviewed by me and considered in my medical decision making (see chart for details).     Overall alert and well-appearing with no focal neurologic deficits or red flag findings for intracranial injury but discussed that given the site and location of her pain I would recommend further imaging to rule out fractures, particularly of the jaw given her pain and inability to fully open.  Discussed that  x-ray is insufficient in this setting which is all we have onsite and she would be best served in the emergency department for this further evaluation.  They declined this at this time and wish to call primary care first for their opinion and recommendation.  Discussed red flag findings to watch for and to go to the emergency department immediately for any of these and discussed ice, over-the-counter pain relievers  Final Clinical Impressions(s) / UC Diagnoses   Final diagnoses:  Contusion of scalp, initial encounter  Right facial numbness  Jaw pain     Discharge Instructions      As we discussed, my recommendation would be to go to the emergency department for further imaging evaluation to rule out any fractures to the jaw or area of impact or any internal injuries.  Particularly if your symptoms worsen, including nausea, vomiting, loss of vision, speech or swallowing changes, weakness numbness or tingling of extremities it is imperative that you go to the emergency department.  In the meantime, ice, over-the-counter pain relievers, rest    ED Prescriptions   None    PDMP not reviewed this encounter.   Stuart Vernell Norris, PA-C 07/24/24 1101    Stuart Vernell [place], [place] 07/24/24 1102

## 2024-07-24 NOTE — ED Triage Notes (Addendum)
 Fell and hit head on window sill while getting out of bed this morning.  States she feels a little dizzy when standing.  States hurts to touch that area and is starting to feel numbness at her jaw area.   States she did not lose consciousness .

## 2024-07-24 NOTE — ED Notes (Signed)
 Patient is being discharged from the Urgent Care and sent to the Emergency Department via private vehicle . Per AP, patient is in need of higher level of care due to head injury and jaw numbness. Patient is aware and verbalizes understanding of plan of care.

## 2024-07-24 NOTE — Discharge Instructions (Signed)
 As we discussed, my recommendation would be to go to the emergency department for further imaging evaluation to rule out any fractures to the jaw or area of impact or any internal injuries.  Particularly if your symptoms worsen, including nausea, vomiting, loss of vision, speech or swallowing changes, weakness numbness or tingling of extremities it is imperative that you go to the emergency department.  In the meantime, ice, over-the-counter pain relievers, rest

## 2024-07-25 ENCOUNTER — Ambulatory Visit: Admitting: Psychiatry

## 2024-07-25 DIAGNOSIS — F3132 Bipolar disorder, current episode depressed, moderate: Secondary | ICD-10-CM | POA: Diagnosis not present

## 2024-07-25 NOTE — Progress Notes (Signed)
 [facility] Counselor/Therapist Progress Note  Patient ID: Megan Whitney, MRN: [id number],    Date: 07/25/2024  Time Spent: 53 minutes   Treatment Type: Individual Therapy  Reported Symptoms: anxiety heighted, agitation at husband, some bipolar, anger at husband at times, but the depression is some better after Dr changed her medication some.   Mental Status Exam:  Appearance:   Casual and Neat     Behavior:  Appropriate, Sharing, and Motivated  Motor:  Normal  Speech/Language:   Clear and Coherent  Affect:  Depressed and anxiety  Mood:  anxious and depressed  Thought process:  goal directed  Thought content:    Rumination  Sensory/Perceptual disturbances:    WNL  Orientation:  oriented to person, place, time/date, situation, day of week, month of year, year, and stated date of Nov. 20, 2025  Attention:  Fair  Concentration:  Good and Fair  Memory:  WNL  Fund of knowledge:   Good and Fair  Insight:    Fair  Judgment:   Good and Fair  Impulse Control:  Good and Fair   Risk Assessment: Danger to Self:  No Self-injurious Behavior: No Danger to Others: No Duty to Warn:no Physical Aggression / Violence:No  Access to Firearms a concern: No  Gang Involvement:No   Subjective:  Patient in today working further on her anxiety, anger at husband, irritable at times, some bipolar, and some improvement in motivation. Has participated more in activities recently including a senior adult church outing which she enjoyed and plans to stay involved. Her knitting group has also met recently and will meet again in 2026. Patient does find that these activities are helpful and she is encouraged to continue her involvement, and husband has been able to attend some events with her. Shares that she has been reaching out to other friends more and that has been helpful, as well as participating in more activities within her church and is checking out senior adult facility mentioned to her at  last appt. Helps her to feel more connected to other. Grand-daughter and parents at coming for visit tomorrow. More focus on self-care, meeting up with friends, and letting others be of support. Overall, more positive today although still worries about husband and future, but also able to see some of her support and some positives.    Interventions: Cognitive Behavioral Therapy, Solution-Oriented/Positive Psychology, and Ego-Supportive Long Term Goal: Reduce overall level, frequency, and intensity of the anxiety so that daily functioning is not impaired. Short Term Goal: 1.Increase understanding of the beliefs and messages that produce the worry and anxiety. Strategies: 1.Help client develop reality-based positive cognitive messages/self-talk. 2. Develop a coping card or other reminder which coping strategies are recorded for patient's later use    Diagnosis:   ICD-10-CM   1. Bipolar disorder with moderate depression (HCC)  F31.32      Plan: Patient today discussing her work on her anxiety, frustration, some anger, depression, and less irritability overall.  Mixed feelings at times due to anger and frustration when things between her and husband do not go well.  They did participate together in a few more activities as he was able to get out some recently with their church group, which seemed helpful for patient and for her husband.  No tearfulness today at all.  Has had more contact with others since her last session which seems to continue to be helpful for her.  Allowing herself to take breaks which is helpful and  yet still not leaving her husband for very long at the time due to his cancer. Continue to work with patient and encouraged her to work on better care of herself and setting healthier limits with others as needed, increasing/improving her own self-care, letting go of guilt, letting others be supportive of her, using good judgment, making decisions and thinking them through rather  than being impulsive, refraining from self sabotage, seeing her strengths and her positives more quickly, engage frequently with her 2 dogs that are very therapeutic for her, and recognize the strengths she shows as she works with goal-directed behaviors to move in a direction that supports her overall physical/emotional health and in her outlook into the future.  Patient does recognize some progress and is encouraged to keep working on holding onto the progress already made which can help her in interacting with husband and also help her better manage the times when she is alone more effectively.  Megan Whitney has made progress and continues to work on moving forward in a more healthy and consistent direction into the future.  States that she continues to work on trying not to automatically assume worst-case scenarios as she realizes at times how that impacts her mood negatively.  Goal review and progress/challenges noted with patient.  Next appointment within 2 weeks.   Barnie Bunde, LCSW

## 2024-07-26 ENCOUNTER — Ambulatory Visit: Payer: Self-pay | Admitting: Internal Medicine

## 2024-07-26 ENCOUNTER — Other Ambulatory Visit: Payer: Self-pay | Admitting: Psychiatry

## 2024-07-26 ENCOUNTER — Other Ambulatory Visit: Payer: Self-pay | Admitting: Internal Medicine

## 2024-07-26 ENCOUNTER — Ambulatory Visit (HOSPITAL_COMMUNITY)
Admission: RE | Admit: 2024-07-26 | Discharge: 2024-07-26 | Disposition: A | Discharge status: Home / Self Care | Source: Ambulatory Visit | Attending: Internal Medicine | Admitting: Internal Medicine

## 2024-07-26 DIAGNOSIS — G8929 Other chronic pain: Secondary | ICD-10-CM | POA: Insufficient documentation

## 2024-07-26 DIAGNOSIS — M47814 Spondylosis without myelopathy or radiculopathy, thoracic region: Secondary | ICD-10-CM | POA: Diagnosis not present

## 2024-07-26 DIAGNOSIS — Z780 Asymptomatic menopausal state: Secondary | ICD-10-CM | POA: Insufficient documentation

## 2024-07-26 DIAGNOSIS — Z0000 Encounter for general adult medical examination without abnormal findings: Secondary | ICD-10-CM

## 2024-07-26 DIAGNOSIS — M546 Pain in thoracic spine: Secondary | ICD-10-CM | POA: Diagnosis not present

## 2024-07-26 DIAGNOSIS — M419 Scoliosis, unspecified: Secondary | ICD-10-CM | POA: Insufficient documentation

## 2024-07-27 ENCOUNTER — Ambulatory Visit (HOSPITAL_BASED_OUTPATIENT_CLINIC_OR_DEPARTMENT_OTHER)
Admission: RE | Admit: 2024-07-27 | Discharge: 2024-07-27 | Disposition: A | Discharge status: Home / Self Care | Source: Ambulatory Visit | Attending: Physical Medicine and Rehabilitation | Admitting: Physical Medicine and Rehabilitation

## 2024-07-27 DIAGNOSIS — M546 Pain in thoracic spine: Secondary | ICD-10-CM | POA: Diagnosis not present

## 2024-07-27 DIAGNOSIS — M5134 Other intervertebral disc degeneration, thoracic region: Secondary | ICD-10-CM

## 2024-07-27 DIAGNOSIS — G8929 Other chronic pain: Secondary | ICD-10-CM

## 2024-07-27 DIAGNOSIS — M47814 Spondylosis without myelopathy or radiculopathy, thoracic region: Secondary | ICD-10-CM

## 2024-07-27 DIAGNOSIS — M419 Scoliosis, unspecified: Secondary | ICD-10-CM

## 2024-07-29 ENCOUNTER — Telehealth: Payer: Self-pay

## 2024-07-29 NOTE — Telephone Encounter (Signed)
 Copied from CRM [id number]. Topic: Clinical - Lab/Test Results >> Jul 29, 2024  4:19 PM Winona R wrote: Please contact the pt to go over DEXA lab results

## 2024-07-30 ENCOUNTER — Ambulatory Visit (INDEPENDENT_AMBULATORY_CARE_PROVIDER_SITE_OTHER): Admitting: Psychiatry

## 2024-07-30 ENCOUNTER — Other Ambulatory Visit

## 2024-07-30 ENCOUNTER — Encounter: Payer: Self-pay | Admitting: Psychiatry

## 2024-07-30 ENCOUNTER — Ambulatory Visit: Payer: Self-pay | Admitting: Physical Medicine and Rehabilitation

## 2024-07-30 DIAGNOSIS — M419 Scoliosis, unspecified: Secondary | ICD-10-CM | POA: Diagnosis not present

## 2024-07-30 DIAGNOSIS — F3132 Bipolar disorder, current episode depressed, moderate: Secondary | ICD-10-CM

## 2024-07-30 DIAGNOSIS — R7989 Other specified abnormal findings of blood chemistry: Secondary | ICD-10-CM

## 2024-07-30 DIAGNOSIS — G2401 Drug induced subacute dyskinesia: Secondary | ICD-10-CM | POA: Diagnosis not present

## 2024-07-30 DIAGNOSIS — G3184 Mild cognitive impairment, so stated: Secondary | ICD-10-CM

## 2024-07-30 DIAGNOSIS — F5105 Insomnia due to other mental disorder: Secondary | ICD-10-CM

## 2024-07-30 DIAGNOSIS — F900 Attention-deficit hyperactivity disorder, predominantly inattentive type: Secondary | ICD-10-CM | POA: Diagnosis not present

## 2024-07-30 DIAGNOSIS — Z79899 Other long term (current) drug therapy: Secondary | ICD-10-CM

## 2024-07-30 DIAGNOSIS — F411 Generalized anxiety disorder: Secondary | ICD-10-CM | POA: Diagnosis not present

## 2024-07-30 MED ORDER — PRAMIPEXOLE DIHYDROCHLORIDE 0.5 MG PO TABS: ORAL_TABLET | ORAL | Status: DC

## 2024-07-30 NOTE — Progress Notes (Signed)
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  Musculoskeletal:  Positive for arthralgias, back pain and gait problem.       Shuffling worse at night after clozapine .  Neurological:  Positive for dizziness and tremors. Negative for weakness.       Ankle  problems and balance problems. No falls lately. Occ stumbles. Tremor is better in hands Mouth movements, mild lip licking.    Psychiatric/Behavioral:  Positive for dysphoric mood. Negative for agitation, behavioral problems, confusion, decreased concentration, hallucinations, self-injury, sleep disturbance and suicidal ideas. The patient is  nervous/anxious. The patient is not hyperactive.   No falls since here. Not currently depressed but unable to remove this from the list.  Medications: I have reviewed the patient's current medications.  Current Outpatient Medications  Medication Sig Dispense Refill   acetaminophen  (TYLENOL ) 650 MG CR tablet Take 1,300 mg by mouth as needed for pain.     atorvastatin  (LIPITOR) 20 MG tablet TAKE 1 TABLET EACH EVENING. 90 tablet 3   cloZAPine  (CLOZARIL ) 25 MG tablet Take 3 tablets (75 mg total) by mouth at bedtime. 90 tablet 2   Deutetrabenazine  ER (AUSTEDO  XR) 48 MG TB24 TAKE 1 TABLET BY MOUTH EVERY DAY 30 tablet 1   Ferrous Gluconate  (IRON  27 PO) Take by mouth.     lamoTRIgine  (LAMICTAL ) 100 MG tablet Take 1 tablet (100 mg) in the am and take 2 tablets (200 mg) in the pm 270 tablet 0   Multiple Vitamin (MULTIVITAMIN ADULT PO) Take by mouth.     pantoprazole  (PROTONIX ) 40 MG tablet Take 1 tablet (40 mg total) by mouth 2 (two) times daily. Office visit for further refills 180 tablet 0   senna (SENOKOT) 8.6 MG tablet Take 1 tablet by mouth daily.     Vilazodone  HCl (VIIBRYD ) 40 MG TABS Take 1 tablet (40 mg total) by mouth daily. 90 tablet 0   Vilazodone  HCl 20 MG TABS Take 1 tablet (20 mg) by mouth daily along with a 40 mg tablet. 90 tablet 0   pramipexole  (MIRAPEX ) 0.5 MG tablet Increase pramipexole  to 1 and 1/2 tablets twice daily for 1 week, then 2 tablets twice daily.     No current facility-administered medications for this visit.    Medication Side Effects: Other: tremor and weight gain.  Dyskinesia appears better  SE bettter than they were.  Balance problems intermittently  Allergies:  Allergies  Allergen Reactions   Azithromycin Anaphylaxis    Other Reaction(s): tongue swelling   Penicillins Anaphylaxis    DID THE REACTION INVOLVE: Swelling of the face/tongue/throat, SOB, or low BP? Yes Sudden or severe rash/hives, skin peeling, or the inside of the mouth or nose? Yes Did it  require medical treatment? No When did it last happen?       If all above answers are NO, may proceed with cephalosporin use.  Patient reacts to Z pack.  HAS Taken amoxicillin  fine.   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    Esophageal stricture    Fracture of superior pubic ramus (HCC) 11/28/2018   GERD (gastroesophageal reflux disease)    Headache(784.0)    Hyperlipidemia    Interstitial cystitis    Joint pain    Lactose intolerance    Lung cancer (HCC)  2002   Neuromuscular disorder (HCC)    Obesity    Osteoarthritis    Osteoporosis    Palpitations    Sleep apnea    Doesn't use a CPAP   Suicidal ideation 01/20/2020   Swallowing difficulty    Tardive dyskinesia     Family History  Problem Relation Age of Onset   Arthritis Mother    Hearing loss Mother    Hyperlipidemia Mother    Hypertension Mother    Depression Mother    Anxiety disorder Mother    Obesity Mother    Sudden death Mother    Hypertension Father    Diabetes Mellitus II Father    Heart disease Father    Arthritis Father    Cancer Father        Brain   COPD Father    Diabetes Father    Hyperlipidemia Father    Sleep apnea Father    Early death Sister        Aneroxia/Bulimic   Depression Brother    Early death Hydrographic Surveyor Accident   Stroke Maternal Grandmother    Hypertension Maternal Grandmother    Arthritis Maternal Grandfather    Heart attack Maternal Grandfather    Hearing loss Maternal Grandfather    Depression Daughter    Drug abuse Daughter    Heart disease Daughter    Hypertension Daughter    Colon  cancer Neg Hx    Esophageal cancer Neg Hx    Rectal cancer Neg Hx    Colon polyps Neg Hx    Stomach cancer Neg Hx     Social History   Socioeconomic History   Marital status: Married    Spouse name: Not on file   Number of children: 1   Years of education: 12   Highest education level: 12th grade  Occupational History   Occupation: retired  Tobacco Use   Smoking status: Never   Smokeless tobacco: Never  Vaping Use   Vaping status: Never Used  Substance and Sexual Activity   Alcohol use: Not Currently    Alcohol/week: 1.0 standard drink of alcohol    Types: 1 Glasses of wine per week    Comment: 1 glass wine occassionally   Drug use: No   Sexual activity: Yes  Other Topics Concern   Not on file  Social History Narrative   Pt lives in [place] with husband Francis.  Followed by Dr. Geoffry for psychiatry and Marval Bunde for therapy.   Right handed   Drinks caffeine   One story home   Married lives with husband   retired   Chief Executive Officer Drivers of Corporate Investment Banker Strain: Low Risk  (06/03/2024)   Overall Financial Resource Strain (CARDIA)    Difficulty of Paying Living Expenses: Not very hard  Food Insecurity: No Food Insecurity (07/17/2024)   Hunger Vital Sign    Worried About Running Out of Food in the Last Year: Never true    Ran Out of Food in the Last Year: Never true  Recent Concern: Food Insecurity - Food Insecurity Present (06/03/2024)   Hunger Vital Sign    Worried About Running Out of Food in the Last Year: Sometimes true    Ran Out of Food in the Last Year: Never true  Transportation Needs: No Transportation Needs (07/17/2024)   PRAPARE - Administrator, Civil Service (Medical): No    Lack of Transportation (Non-Medical): No  Physical Activity: Inactive (07/17/2024)   Exercise Vital Sign    Days of Exercise per Week: 0 days    Minutes of Exercise per Session: 0 min  Stress: Stress Concern Present (07/17/2024)   [employer] of  Occupational Health - Occupational Stress Questionnaire    Feeling of Stress: To some extent  Social Connections: Socially Integrated (07/17/2024)   Social Connection and Isolation Panel    Frequency of Communication with Friends and Family: Twice a week    Frequency of Social Gatherings with Friends and Family: Once a week    Attends Religious Services: More than 4 times per year    Active Member of [employer] or Organizations: Yes    Attends Banker Meetings: More than 4 times per year    Marital Status: Married  Catering Manager Violence: Not At Risk (07/17/2024)   Humiliation, Afraid, Rape, and Kick questionnaire    Fear of Current or Ex-Partner: No    Emotionally Abused: No    Physically Abused: No    Sexually Abused: No    Past Medical History, Surgical history, Social history, and Family history were reviewed and updated as appropriate.   Please see review of systems for further details on the patient's review from today.   Objective:   Physical Exam:  There were no vitals taken for this visit.  Physical Exam Neurological:     Mental Status: She is alert and oriented to person, place, and time.     Cranial Nerves: No dysarthria.     Gait: Gait normal.     Comments: Lip licking consistent, and some pursing now minimal Good gait  Psychiatric:        Attention and Perception: Attention and perception normal.        Mood and Affect: Mood is anxious and depressed. Affect is not tearful.        Speech: Speech normal.        Behavior: Behavior is not slowed. Behavior is cooperative.        Thought Content: Thought content normal. Thought content is not paranoid or delusional. Thought content does not include homicidal or suicidal ideation. Thought content does not include suicidal plan.        Cognition and Memory: Cognition and memory normal.        Judgment: Judgment normal.     Comments: Insight intact Worsening dep No pressure     Lab Review:      Component Value Date/Time   NA 138 06/02/2024 1308   NA 142 03/22/2024 0814   K 3.3 (L) 06/02/2024 1308   CL 99 06/02/2024 1308   CO2 26 06/02/2024 1308   GLUCOSE 102 (H) 06/02/2024 1308   BUN 14 06/02/2024 1308   BUN 14 03/22/2024 0814   CREATININE 0.87 06/02/2024 1308   CALCIUM  9.2 06/02/2024 1308   PROT 7.2 06/02/2024 1308   PROT 6.8 03/22/2024 0814   ALBUMIN 4.0 06/02/2024 1308   ALBUMIN 4.5 03/22/2024 0814   AST 32 06/02/2024 1308   ALT 21 06/02/2024 1308   ALKPHOS 63 06/02/2024 1308   BILITOT 0.6 06/02/2024 1308   BILITOT 0.5 03/22/2024 0814   GFRNONAA >60 06/02/2024 1308   GFRAA 96 03/02/2020 1433       Component Value Date/Time   WBC 9.3 06/02/2024 1308   RBC 4.45 06/02/2024 1308   HGB 13.5 06/02/2024 1308   HGB 13.6 03/22/2024 0814   HCT 41.4 06/02/2024 1308   HCT 42.0 03/22/2024 0814  PLT 183 06/02/2024 1308   PLT 220 03/22/2024 0814   MCV 93.0 06/02/2024 1308   MCV 93 03/22/2024 0814   MCH 30.3 06/02/2024 1308   MCHC 32.6 06/02/2024 1308   RDW 14.3 06/02/2024 1308   RDW 12.7 03/22/2024 0814   LYMPHSABS 2.8 03/22/2024 0814   MONOABS 0.7 04/04/2019 1004   EOSABS 0.3 03/22/2024 0814   BASOSABS 0.0 03/22/2024 0814  Vitamin D  level 33 on 10K units daily on 12/4/`9 Increased to prescription vitamin d  50K units Monday, Wed, Friday.  Rx sent in.   Lithium  Lvl  Date Value Ref Range Status  10/21/2018 0.18 (L) 0.60 - 1.20 mmol/L Final    Comment:    Performed at [facility], [address], [place], [place] [address]     No results found for: PHENYTOIN, PHENOBARB, VALPROATE, CBMZ   .res Assessment: Plan:    Bipolar disorder with moderate depression (HCC)  Generalized anxiety disorder  Attention deficit hyperactivity disorder (ADHD), predominantly inattentive type  Tardive dyskinesia  Mild cognitive impairment  Insomnia due to mental condition  Long term current use of clozapine   Low vitamin D  level  30 min face to face time with  patient.. We discussed multiple dxes and concerns.   Yoko has chronic rapid cycling bipolar disorder which is chronically unstable and has been difficult to control.  Failed 14 different mood stabilizers.  The rapid cycling is making it difficult to control frequency of depressive episodes and the anxiety as well.  We have typically had to make frequent med changes.   Disc gradual increase bc med sensitivity.  Med sensitivity to SE seems to be the biggest problem preventing better mood control SX remain TR  Disc options for better control of dep, irritability and anxiety.  Incr Viibryd  vs clozapine  If she can tolerate it the most effective option would be to increase the clozapine  which could help depression, anxiety and irritability.  But at this time she does not believe she can tolerate a higher dose of clozapine .    Ingrezza  for TD helped reduce tongue chewing but still some mouth movements at 60 mg BID  for lip licking and pursing.  Shuffling only at night now.  Drooling resolved Stopped ingrezza  DT insurance won't cover BID anymore.  Austedo  XR 48 mg daily.  Still complaining of tongue and lip movements and mild grinding or biting tongue now.  Training and development officer.   Extensive discussion of clozapine  dosing recommendations but we will increase more slowly bc she is so med sensitive.Disc risk low WBC, cardiomyopathy, etc, sedation  (Started clozapine  on 12/07/21).    ON THIS OVER 2 YEARS.  PER fda CAN NOW STOP LABS EXCEPT AS NEEDED. For anxiety and irritability, reactivity , mood swings continue clozapine  but switch to 75 mg HS.  She coouldn't tolerate higher dose and so mood swings are worse.  Lately dealing with dep and anxiety but better the last couple of days.  Not ideal to use pramipexole  with clozapine  but few options remain.    She has a high residual anxiety.  It has been impossible to control all of her symptoms simultaneously without causing side effects. Failed various  meds.  Would like to avoid BZ if possible.  Lamotrigine  100 mg Am and 200 mg pm.    Failed all reasonable alternatives for anxiety.  Option Auvelity.  She is under a lot of genuine stress. Disc ECT. Only FDA approved option left. She wants to defer.   Will try off label  pramipexole  for both dep and mouth movements bc it can sometimes help TD like sx and should not at least make it worse which stimulants have done.  Discussed potential metabolic side effects associated with atypical antipsychotics, as well as potential risk for movement side effects. Advised pt to contact office if movement side effects occur.    Checked B12 folate bc memory complaints.  Normal B12 & folate on 05/25/22  Disc SE meds and this is heightened by the complication of necessary polypharmacy.  Counseling: on stress management, isolation, dealing with H's drinking.  And dealing with his cancer.  He has prostate CA with mets.  Continue counseling . It helps.  Living situation is getting worse as H's health going downhill fast.  He's falling more and making bad decisions.  Trying to get H into CA study.   Requires frequent FU DT chronic instability.  Wants to schedule monthly.  Continue Viibryd  60 mg daily; If pramipexole  works, then reduce this to 40 mg daily.  Increase pramipexole  to 1 and 1/2 tablets twice daily for 1 week, then 2 tablets twice daily.Higher if needed.  FU 4 weeks.   Please see After Visit Summary for patient specific instructions.  Lorene Macintosh, MD, DFAPA     Future Appointments  Date Time Provider Department Center  08/08/2024 10:00 AM Sherlynn Sober, LCSW [facility] None  08/13/2024 10:30 AM Cottle, Lorene KANDICE Raddle., MD [facility] None  08/19/2024  1:00 PM Trudy Duwaine BRAVO, NP [facility] None  08/22/2024 10:00 AM Sherlynn Sober, LCSW [facility] None  09/10/2024 10:00 AM Sherlynn Sober, LCSW [facility] None  09/12/2024 10:00 AM Cottle, Lorene KANDICE Raddle., MD [facility] None  09/24/2024 10:00 AM Sherlynn Sober, LCSW [facility] None   10/01/2024  3:00 PM Tobie Suzzane POUR, MD [facility] [address]  10/08/2024 10:00 AM Sherlynn Sober, LCSW [facility] None  10/14/2024 10:30 AM Cottle, Lorene KANDICE Raddle., MD [facility] None  10/22/2024 10:00 AM Sherlynn Sober, LCSW [facility] None  11/11/2024 11:00 AM Cottle, Lorene KANDICE Raddle., MD [facility] None  07/18/2025  9:20 AM [facility]-ANNUAL WELLNESS VISIT [facility] [address]    No orders of the defined types were placed in this encounter.      -------------------------------

## 2024-07-30 NOTE — Patient Instructions (Signed)
 Increase pramipexole  to 1 and 1/2 tablets twice daily for 1 week, then 2 tablets twice daily.

## 2024-08-08 ENCOUNTER — Ambulatory Visit: Admitting: Psychiatry

## 2024-08-11 ENCOUNTER — Other Ambulatory Visit: Payer: Self-pay | Admitting: Psychiatry

## 2024-08-11 DIAGNOSIS — F3132 Bipolar disorder, current episode depressed, moderate: Secondary | ICD-10-CM

## 2024-08-13 ENCOUNTER — Encounter: Payer: Self-pay | Admitting: Psychiatry

## 2024-08-13 ENCOUNTER — Telehealth: Admitting: Psychiatry

## 2024-08-13 DIAGNOSIS — F5105 Insomnia due to other mental disorder: Secondary | ICD-10-CM

## 2024-08-13 DIAGNOSIS — F411 Generalized anxiety disorder: Secondary | ICD-10-CM

## 2024-08-13 DIAGNOSIS — F3132 Bipolar disorder, current episode depressed, moderate: Secondary | ICD-10-CM | POA: Diagnosis not present

## 2024-08-13 DIAGNOSIS — F900 Attention-deficit hyperactivity disorder, predominantly inattentive type: Secondary | ICD-10-CM | POA: Diagnosis not present

## 2024-08-13 DIAGNOSIS — R7989 Other specified abnormal findings of blood chemistry: Secondary | ICD-10-CM

## 2024-08-13 DIAGNOSIS — G2401 Drug induced subacute dyskinesia: Secondary | ICD-10-CM | POA: Diagnosis not present

## 2024-08-13 DIAGNOSIS — Z79899 Other long term (current) drug therapy: Secondary | ICD-10-CM

## 2024-08-13 DIAGNOSIS — R251 Tremor, unspecified: Secondary | ICD-10-CM

## 2024-08-13 DIAGNOSIS — G3184 Mild cognitive impairment, so stated: Secondary | ICD-10-CM

## 2024-08-13 MED ORDER — LAMOTRIGINE 100 MG PO TABS: ORAL_TABLET | ORAL | 1 refills | Status: AC

## 2024-08-13 MED ORDER — PRAMIPEXOLE DIHYDROCHLORIDE 0.75 MG PO TABS: 0.7500 mg | ORAL_TABLET | Freq: Two times a day (BID) | ORAL | 1 refills | Status: DC

## 2024-08-13 NOTE — Progress Notes (Signed)
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 tablets twice daily for 1 week, then 2 tablets twice daily.Higher if needed.  08/13/24 appt noted:  Med:  clozapine  75 HS, lamotrigine  100 AM and 200 PM, Viibryd  40+20 mg daily. Austedo  XR 48 AM , pramipexole  1 mg BID for 5 days then cut back Compliant. Did make a difference positives included  much less dep.  Still have some.  Got more things done on higher dose.   But seemed to make movements worse.   Usually 7 hours of sleep and typically 8 or more.   Not hyper on higher dose but did have lots of racing thoughts but wants to increase back to 1 mg BID.  Still has mouth movements but  not as bad as in the past.  Seems to wax an wane.  Health with back pain.    Past Psychiatric Medication Trials: Vraylar  4.5 SE mouth movements reduced to 3 mg 3/20. It was effective at lower doses for depression.  Worse off it.  Vraylar  1.5 mg every third day led to relapse of significant depression. Caplyta SE at 42 mg .  Cost problems Latuda 80, , olanzapine, Seroquel, risperidone, Abilify , loxapine  25 mg BID (max tolerated) NR, Clozapine  100 SINCE 12/07/21  Ingrezza  60 BID  helps Austedo   lithium  150 tremor   Trileptal  450, Depakote, Equetro  300 hx balance issues, CBZ ER falling,   Lamictal  200 twice daily,  Focalin  15 BID,  Ritalin ,   fluoxetine  60,  sertraline 100, Wellbutrin history of facial tics, paroxetine cognitive side effects, Lexapro  10 worse Fluvoxamine  50  buspirone,   ropinirole, amantadine , Sinemet, Artane, Cogentin,  pramipexole  0.5 mg BID helped tremor but caused anger trazodone hangover, Ambien hangover,  Review of Systems:  Review of Systems  HENT:  Positive for dental problem and tinnitus.        Chirping cricket sounds in hears since January 2023 drooling  Respiratory:  Negative for cough.   Cardiovascular:  Negative for chest pain.  Gastrointestinal:  Negative for abdominal pain and nausea.       GERD awakening her  Musculoskeletal:  Positive for arthralgias, back pain and gait problem.       Shuffling worse at night after clozapine .  Neurological:  Positive for dizziness and tremors. Negative for weakness.       Ankle problems and balance problems. No falls lately. Occ stumbles. Tremor is better in hands Mouth movements, mild lip licking.    Psychiatric/Behavioral:  Positive for dysphoric mood. Negative for agitation, behavioral problems, confusion, decreased concentration, hallucinations, self-injury, sleep disturbance and suicidal ideas. The patient is nervous/anxious. The patient is not hyperactive.   No falls since here. Not currently depressed  but unable to remove this from the list.  Medications: I have reviewed the patient's current medications.  Current Outpatient Medications  Medication Sig Dispense Refill   acetaminophen  (TYLENOL ) 650 MG CR tablet Take 1,300 mg by mouth as needed for pain.     atorvastatin  (LIPITOR) 20 MG tablet TAKE 1 TABLET EACH EVENING. 90 tablet 3   cloZAPine  (CLOZARIL ) 25 MG tablet Take 3 tablets (75 mg total) by mouth at bedtime. 90 tablet 2   Deutetrabenazine  ER (AUSTEDO  XR) 48 MG TB24 TAKE 1 TABLET BY MOUTH EVERY DAY 30 tablet 1   Ferrous Gluconate  (IRON  27 PO) Take by mouth.     Multiple Vitamin (MULTIVITAMIN ADULT PO) Take by mouth.     pantoprazole  (PROTONIX ) 40 MG tablet Take 1 tablet (40 mg total) by mouth 2 (two) times daily. Office visit for further refills 180 tablet 0   senna (SENOKOT) 8.6 MG tablet Take 1 tablet by mouth daily.     Vilazodone  HCl (VIIBRYD ) 40 MG TABS Take 1 tablet (40 mg total) by mouth daily. 90 tablet 0   Vilazodone  HCl 20 MG TABS Take 1 tablet (20 mg) by mouth daily  along with a 40 mg tablet. 90 tablet 0   lamoTRIgine  (LAMICTAL ) 100 MG tablet Take 1 tablet (100 mg) in the am and take 2 tablets (200 mg) in the pm 270 tablet 1   pramipexole  (MIRAPEX ) 0.75 MG tablet Take 1 tablet (0.75 mg total) by mouth 2 (two) times daily. 60 tablet 1   No current facility-administered medications for this visit.    Medication Side Effects: Other: tremor and weight gain.  Dyskinesia appears better  SE bettter than they were.  Balance problems intermittently  Allergies:  Allergies  Allergen Reactions   Azithromycin Anaphylaxis    Other Reaction(s): tongue swelling   Penicillins Anaphylaxis    DID THE REACTION INVOLVE: Swelling of the face/tongue/throat, SOB, or low BP? Yes Sudden or severe rash/hives, skin peeling, or the inside of the mouth or nose? Yes Did it require medical treatment? No When did it last happen?       If all above answers are NO, may proceed with cephalosporin  use.  Patient reacts to Z pack.  HAS Taken amoxicillin  fine.   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    Esophageal stricture    Fracture of superior pubic ramus (HCC) 11/28/2018   GERD (gastroesophageal reflux disease)    Headache(784.0)    Hyperlipidemia    Interstitial cystitis    Joint pain    Lactose intolerance    Lung cancer (HCC) 2002   Neuromuscular disorder (HCC)    Obesity    Osteoarthritis    Osteoporosis    Palpitations    Sleep apnea    Doesn't use a CPAP   Suicidal ideation 01/20/2020   Swallowing difficulty    Tardive dyskinesia     Family History  Problem Relation Age of Onset   Arthritis Mother    Hearing loss Mother    Hyperlipidemia Mother    Hypertension Mother    Depression Mother    Anxiety disorder Mother    Obesity Mother    Sudden death Mother    Hypertension Father    Diabetes Mellitus II Father    Heart disease Father    Arthritis Father    Cancer Father        Brain   COPD Father    Diabetes Father    Hyperlipidemia Father    Sleep apnea Father    Early death Sister        Aneroxia/Bulimic   Depression Brother    Early death Hydrographic Surveyor Accident   Stroke Maternal Grandmother    Hypertension Maternal Grandmother    Arthritis Maternal Grandfather    Heart attack Maternal Grandfather    Hearing loss Maternal Grandfather    Depression Daughter    Drug abuse Daughter    Heart disease Daughter    Hypertension Daughter    Colon cancer Neg Hx    Esophageal cancer Neg Hx    Rectal cancer Neg Hx  Colon polyps Neg Hx    Stomach cancer Neg Hx      Social History   Socioeconomic History   Marital status: Married    Spouse name: Not on file   Number of children: 1   Years of education: 12   Highest education level: 12th grade  Occupational History   Occupation: retired  Tobacco Use   Smoking status: Never   Smokeless tobacco: Never  Vaping Use   Vaping status: Never Used  Substance and Sexual Activity   Alcohol use: Not Currently    Alcohol/week: 1.0 standard drink of alcohol    Types: 1 Glasses of wine per week    Comment: 1 glass wine occassionally   Drug use: No   Sexual activity: Yes  Other Topics Concern   Not on file  Social History Narrative   Pt lives in [place] with husband Francis.  Followed by Dr. Geoffry for psychiatry and Marval Bunde for therapy.   Right handed   Drinks caffeine   One story home   Married lives with husband   retired   Chief Executive Officer Drivers of Corporate Investment Banker Strain: Low Risk  (06/03/2024)   Overall Financial Resource Strain (CARDIA)    Difficulty of Paying Living Expenses: Not very hard  Food Insecurity: No Food Insecurity (07/17/2024)   Hunger Vital Sign    Worried About Running Out of Food in the Last Year: Never true    Ran Out of Food in the Last Year: Never true  Recent Concern: Food Insecurity - Food Insecurity Present (06/03/2024)   Hunger Vital Sign    Worried About Running Out of Food in the Last Year: Sometimes true    Ran Out of Food in the Last Year: Never true  Transportation Needs: No Transportation Needs (07/17/2024)   PRAPARE - Administrator, Civil Service (Medical): No    Lack of Transportation (Non-Medical): No  Physical Activity: Inactive (07/17/2024)   Exercise Vital Sign    Days of Exercise per Week: 0 days    Minutes of Exercise per Session: 0 min  Stress: Stress Concern Present (07/17/2024)   [employer] of Occupational Health - Occupational Stress Questionnaire    Feeling of Stress: To some extent  Social Connections:  Socially Integrated (07/17/2024)   Social Connection and Isolation Panel    Frequency of Communication with Friends and Family: Twice a week    Frequency of Social Gatherings with Friends and Family: Once a week    Attends Religious Services: More than 4 times per year    Active Member of [employer] or Organizations: Yes    Attends Banker Meetings: More than 4 times per year    Marital Status: Married  Catering Manager Violence: Not At Risk (07/17/2024)   Humiliation, Afraid, Rape, and Kick questionnaire    Fear of Current or Ex-Partner: No    Emotionally Abused: No    Physically Abused: No    Sexually Abused: No    Past Medical History, Surgical history, Social history, and Family history were reviewed and updated as appropriate.   Please see review of systems for further details on the patient's review from today.   Objective:   Physical Exam:  There were no vitals taken for this visit.  Physical Exam Neurological:     Mental Status: She is alert and oriented to person, place, and time.     Cranial Nerves: No dysarthria.     Gait: Gait normal.  Comments: Lip licking consistent, and some pursing now minimal Good gait  Psychiatric:        Attention and Perception: Attention and perception normal.        Mood and Affect: Mood is anxious and depressed. Affect is not tearful.        Speech: Speech normal. Speech is not rapid and pressured.        Behavior: Behavior is not slowed. Behavior is cooperative.        Thought Content: Thought content normal. Thought content is not paranoid or delusional. Thought content does not include homicidal or suicidal ideation. Thought content does not include suicidal plan.        Cognition and Memory: Cognition and memory normal.        Judgment: Judgment normal.     Comments: Insight intact Worsening dep No pressure     Lab Review:     Component Value Date/Time   NA 138 06/02/2024 1308   NA 142 03/22/2024 0814   K 3.3  (L) 06/02/2024 1308   CL 99 06/02/2024 1308   CO2 26 06/02/2024 1308   GLUCOSE 102 (H) 06/02/2024 1308   BUN 14 06/02/2024 1308   BUN 14 03/22/2024 0814   CREATININE 0.87 06/02/2024 1308   CALCIUM  9.2 06/02/2024 1308   PROT 7.2 06/02/2024 1308   PROT 6.8 03/22/2024 0814   ALBUMIN 4.0 06/02/2024 1308   ALBUMIN 4.5 03/22/2024 0814   AST 32 06/02/2024 1308   ALT 21 06/02/2024 1308   ALKPHOS 63 06/02/2024 1308   BILITOT 0.6 06/02/2024 1308   BILITOT 0.5 03/22/2024 0814   GFRNONAA >60 06/02/2024 1308   GFRAA 96 03/02/2020 1433       Component Value Date/Time   WBC 9.3 06/02/2024 1308   RBC 4.45 06/02/2024 1308   HGB 13.5 06/02/2024 1308   HGB 13.6 03/22/2024 0814   HCT 41.4 06/02/2024 1308   HCT 42.0 03/22/2024 0814   PLT 183 06/02/2024 1308   PLT 220 03/22/2024 0814   MCV 93.0 06/02/2024 1308   MCV 93 03/22/2024 0814   MCH 30.3 06/02/2024 1308   MCHC 32.6 06/02/2024 1308   RDW 14.3 06/02/2024 1308   RDW 12.7 03/22/2024 0814   LYMPHSABS 2.8 03/22/2024 0814   MONOABS 0.7 04/04/2019 1004   EOSABS 0.3 03/22/2024 0814   BASOSABS 0.0 03/22/2024 0814  Vitamin D  level 33 on 10K units daily on 12/4/`9 Increased to prescription vitamin d  50K units Monday, Wed, Friday.  Rx sent in.   Lithium  Lvl  Date Value Ref Range Status  10/21/2018 0.18 (L) 0.60 - 1.20 mmol/L Final    Comment:    Performed at [facility], [address], [place], [place] [address]     No results found for: PHENYTOIN, PHENOBARB, VALPROATE, CBMZ   .res Assessment: Plan:    Bipolar disorder with moderate depression (HCC) - Plan: pramipexole  (MIRAPEX ) 0.75 MG tablet, lamoTRIgine  (LAMICTAL ) 100 MG tablet  Generalized anxiety disorder  Attention deficit hyperactivity disorder (ADHD), predominantly inattentive type  Tardive dyskinesia  Mild cognitive impairment  Insomnia due to mental condition  Long term current use of clozapine   Low vitamin D  level  Tremor of both hands  30 min face  to face time with patient.. We discussed multiple dxes and concerns.   Megan Whitney has chronic rapid cycling bipolar disorder which is chronically unstable and has been difficult to control.  Failed 14 different mood stabilizers.  The rapid cycling is making it difficult to control  frequency of depressive episodes and the anxiety as well.  We have typically had to make frequent med changes.   Disc gradual increase bc med sensitivity.  Med sensitivity to SE seems to be the biggest problem preventing better mood control SX remain TR  Disc options for better control of dep, irritability and anxiety.  Incr Viibryd  vs clozapine  If she can tolerate it the most effective option would be to increase the clozapine  which could help depression, anxiety and irritability.  But at this time she does not believe she can tolerate a higher dose of clozapine .    Ingrezza  for TD helped reduce tongue chewing but still some mouth movements at 60 mg BID  for lip licking and pursing.  Shuffling only at night now.  Drooling resolved Stopped ingrezza  DT insurance won't cover BID anymore.  Austedo  XR 48 mg daily.  Still complaining of tongue and lip movements and mild grinding or biting tongue now.  Training and development officer.   Extensive discussion of clozapine  dosing recommendations but we will increase more slowly bc she is so med sensitive.Disc risk low WBC, cardiomyopathy, etc, sedation  (Started clozapine  on 12/07/21).    ON THIS OVER 2 YEARS.  PER fda CAN NOW STOP LABS EXCEPT AS NEEDED. For anxiety and irritability, reactivity , mood swings continue clozapine  but switch to 75 mg HS.  She coouldn't tolerate higher dose and so mood swings are worse.  Lately dealing with dep and anxiety but better the last couple of days.  Not ideal to use pramipexole  with clozapine  but few options remain.    She has a high residual anxiety.  It has been impossible to control all of her symptoms simultaneously without causing side effects. Failed  various meds.  Would like to avoid BZ if possible.  Lamotrigine  100 mg Am and 200 mg pm.    Failed all reasonable alternatives for anxiety.  Option Auvelity.  She is under a lot of genuine stress. Disc ECT. Only FDA approved option left. She wants to defer.   Will try off label pramipexole  for both dep and mouth movements bc it can sometimes help TD like sx and should not at least make it worse which stimulants have done.  Discussed potential metabolic side effects associated with atypical antipsychotics, as well as potential risk for movement side effects. Advised pt to contact office if movement side effects occur.    Checked B12 folate bc memory complaints.  Normal B12 & folate on 05/25/22  Disc SE meds and this is heightened by the complication of necessary polypharmacy.  Counseling: on stress management, isolation, dealing with H's drinking.  And dealing with his cancer.  He has prostate CA with mets.  Continue counseling . It helps.  Living situation is getting worse as H's health going downhill fast.  He's falling more and making bad decisions.  Trying to get H into CA study.   Requires frequent FU DT chronic instability.  Wants to schedule monthly.  Continue Viibryd  60 mg daily; If pramipexole  works, then reduce this to 40 mg daily.  Increase pramipexole0.75 mg BID  FU 4 weeks.   Please see After Visit Summary for patient specific instructions.  Lorene Macintosh, MD, DFAPA     Future Appointments  Date Time Provider Department Center  08/16/2024  9:00 AM Sherlynn Sober, LCSW [facility] None  08/19/2024  1:00 PM Trudy Duwaine BRAVO, NP [facility] None  09/02/2024  9:00 AM Sherlynn Sober, LCSW [facility] None  09/12/2024 10:00 AM Macintosh, Lorene MATSU  Mickey., MD [facility] None  09/16/2024 10:00 AM Sherlynn Sober, LCSW [facility] None  09/30/2024 10:00 AM Sherlynn Sober, LCSW [facility] None  10/01/2024  3:00 PM Tobie Suzzane POUR, MD [facility] [address]  10/14/2024 10:30 AM Cottle, Lorene KANDICE Mickey., MD [facility] None  10/15/2024  10:00 AM Sherlynn Sober, LCSW [facility] None  10/28/2024 10:00 AM Sherlynn Sober, LCSW [facility] None  11/11/2024 11:00 AM Cottle, Lorene KANDICE Mickey., MD [facility] None  12/10/2024  9:30 AM Cottle, Lorene KANDICE Mickey., MD [facility] None  07/18/2025  9:20 AM [facility]-ANNUAL WELLNESS VISIT [facility] [address]    No orders of the defined types were placed in this encounter.      -------------------------------

## 2024-08-15 ENCOUNTER — Ambulatory Visit: Admitting: Gastroenterology

## 2024-08-16 ENCOUNTER — Ambulatory Visit: Admitting: Psychiatry

## 2024-08-16 DIAGNOSIS — F3132 Bipolar disorder, current episode depressed, moderate: Secondary | ICD-10-CM | POA: Diagnosis not present

## 2024-08-16 NOTE — Progress Notes (Signed)
 [facility] Counselor/Therapist Progress Note  Patient ID: Megan Whitney, MRN: [id number],    Date: 08/16/2024  Time Spent: 53 minutes   Treatment Type: Individual Therapy  Reported Symptoms: depression but better past week, anxiety some better, less flying off the handle, some agitation with husband but has improved some per her report. Some anger since last appt but not as bad.     Mental Status Exam:  Appearance:   Casual and Neat     Behavior:  Appropriate, Sharing, and Motivated  Motor:  Normal  Speech/Language:   Clear and Coherent  Affect:  Depressed and anxiety  Mood:  anxious and depressed  Thought process:  goal directed  Thought content:    WNL  Sensory/Perceptual disturbances:    WNL  Orientation:  oriented to person, place, time/date, situation, day of week, month of year, year, and stated date of Dec. 12, 2025  Attention:  Good  Concentration:  Good  Memory:  WNL  Fund of knowledge:   Good and Fair  Insight:    Good and Fair  Judgment:   Good and Fair  Impulse Control:  Good and Fair   Risk Assessment: Danger to Self:  No Self-injurious Behavior: No Danger to Others: No Duty to Warn:no Physical Aggression / Violence:No  Access to Firearms a concern: No  Gang Involvement:No    Subjective:  Patient today reporting and working on her anxiety, some anger at husband, some irritability at times, bipolar, and sharing some information re: concerns in extended family relationships. (Not all details included in this note due to patient privacy needs.) Talking further today through some of her anticipatory grief and that seemed helpful to her, and went into more detail that she has previously.  She is showing some good strength and some improved insight on certain issues regarding husband and her grief.  Less anger more recently re: husband. Keeping contact with husband's daughter (with some concerns about issue raised.) Feeling some increased strength  recently. Still enjoying contact with her friends and some activities within her senior adult church group and plans to continue.  Encouraged her continuing to reach out to friends and be able to spend time with them which she is finding helpful.  States that she is better overall today and feels like her recent medication changes helped and seems to have also helped her confidence within herself.  Interventions: Cognitive Behavioral Therapy, Solution-Oriented/Positive Psychology, and Ego-Supportive Long Term Goal: Reduce overall level, frequency, and intensity of the anxiety so that daily functioning is not impaired. Short Term Goal: 1.Increase understanding of the beliefs and messages that produce the worry and anxiety. Strategies: 1.Help client develop reality-based positive cognitive messages/self-talk. 2. Develop a coping card or other reminder which coping strategies are recorded for patient's later use   Diagnosis:   ICD-10-CM   1. Bipolar disorder with moderate depression (HCC)  F31.32      Plan: Patient today working well in session on her anxiety, some depression, some anger, frustration, but less irritable.  Continues to have some mixed feelings of anger and frustration when things flareup between she and her husband.  Does feel that her recent medicine change has helped her and her mood and has been continuing to be involved in activities outside the home at times with friends and church.  No tearfulness at all today encouraged her to contact with other people and having consistent getting up times and going to bed times which seems to be  helpful to her when she does this.  Today seems to be feeling more confident.  Encouraged patient and continuing her work on better care of herself and setting healthier limits with others as needed, increasing/improving her own self-care, letting go of guilt, letting others be supportive of her, using good judgment, making decisions and thinking  them through rather than being impulsive, refraining from self sabotage, seeing her strengths and her positives more quickly, engage frequently with her 2 dogs that are very therapeutic for her, walking some each day, and realize the strengths she shows as she works with goal-directed behaviors to move in a direction that supports her overall physical/emotional health and her outlook going forward.  Patient states that she does recognize some progress and is encouraged today to keep working on holding onto the progress already made which can help her and interacting with husband and also help her better manage the times when she is alone more effectively.  Monitoring patient more closely as the holiday times can be challenging and she knows she can call at any point as needed.  Goal review and progress/challenges noted with patient.  Next appointment within 2 weeks.   Barnie Bunde, LCSW

## 2024-08-19 ENCOUNTER — Encounter: Payer: Self-pay | Admitting: Physical Medicine and Rehabilitation

## 2024-08-19 ENCOUNTER — Ambulatory Visit: Admitting: Physical Medicine and Rehabilitation

## 2024-08-19 DIAGNOSIS — M47814 Spondylosis without myelopathy or radiculopathy, thoracic region: Secondary | ICD-10-CM | POA: Diagnosis not present

## 2024-08-19 DIAGNOSIS — M419 Scoliosis, unspecified: Secondary | ICD-10-CM

## 2024-08-19 DIAGNOSIS — M546 Pain in thoracic spine: Secondary | ICD-10-CM | POA: Diagnosis not present

## 2024-08-19 DIAGNOSIS — G8929 Other chronic pain: Secondary | ICD-10-CM

## 2024-08-19 NOTE — Progress Notes (Unsigned)
 Megan Whitney - 68 y.o. female MRN [id number]  Date of birth: Oct 14, 1955  Office Visit Note: Visit Date: 08/19/2024 PCP: Tobie Suzzane POUR, MD Referred by: Tobie Suzzane POUR, MD  Subjective: Chief Complaint  Patient presents with   Lower Back - Pain   HPI: Megan Whitney is a 68 y.o. female who comes in today for evaluation of chronic, worsening and severe bilateral thoracic back pain. She is here today for thoracic MRI review. Pain ongoing for 1 year, worsens with movement and activity. She describes pain as sore and aching sensation, currently rates as 3 out of 10. Some relief of pain with home exercise regimen, rest and use of medications. She is wearing postural extension brace today with some relief of pain. History of formal physical therapy in the past with significant relief of pain. She was previously managed by Dr. Donaciano Sprang at [facility] for same issue. Thoracic radiographs from March of this year show severe scoliosis, multi level degenerative changes and chronic compression fracture. Recent thoracic MRI imaging shows degenerative disc disease with Modic type 1 changes in the anterior vertebral endplates at T8-T9, T9-10 and T10-11. No severe nerve impingement, no high grade spinal canal stenosis. No history of thoracic surgery/injections. Patient denies focal weakness, numbness and tingling. No recent trauma or falls. She is caring for husband with prostate cancer.       Review of Systems  Musculoskeletal:  Positive for back pain and myalgias.  Neurological:  Negative for tingling, sensory change, focal weakness and weakness.  All other systems reviewed and are negative.  Otherwise per HPI.  Assessment & Plan: Visit Diagnoses:    ICD-10-CM   1. Chronic bilateral thoracic back pain  M54.6 Ambulatory referral to Physical Therapy   G89.29     2. Thoracic spondylosis  M47.814 Ambulatory referral to Physical Therapy    3. Scoliosis of thoracic spine, unspecified  scoliosis type  M41.9 Ambulatory referral to Physical Therapy       Plan: Findings:  Chronic, worsening and severe bilateral thoracic back pain. No radicular pain. Patient continues to have pain despite good conservative therapies such as formal physical therapy, home exercise regimen, rest and use of medications. Patients clinical presentation and exam are complex. I discussed recent thoracic MRI with her today using imaging. There are degenerative disc disease with Modic type 1 changes in the anterior vertebral endplates at T8-T9, T9-10 and T10-11. No significant nerve compression. No high grade spinal canal stenosis. Good relief of pain with physical therapy earlier in the year, unfortunately she did not continue with home exercise regimen post PT. She admits to high stress situation caring for her husband. We discussed treatment plan in detail today. At this point, would not recommend interventional spine procedures as there is no nerve impingement. I recommend re-grouping with formal physical therapy to help develop home exercise regimen. I do think she would benefit from strengthening and range of motion exercises. She would prefer to attend PT in [place].  She can continue to wear thoracic brace for comfort. Her exam today was non focal, good strength to bilateral upper extremities.      Meds & Orders: No orders of the defined types were placed in this encounter.   Orders Placed This Encounter  Procedures   Ambulatory referral to Physical Therapy    Follow-up: Return if symptoms worsen or fail to improve.   Procedures: No procedures performed      Clinical History: EXAM: LUMBAR SPINE - COMPLETE 4+  VIEW   COMPARISON:  MR lumbar spine February 22, 2005   FINDINGS: There is no evidence of lumbar spine fracture. Mild curvature of spine. Minimal facet joint sclerosis in the lower lumbar spine. Minimal anterior spurring with narrow intervertebral spaces in lower thoracic and upper  lumbar spine. Grade 1 anterolisthesis of L4 on L5.   IMPRESSION: Mild degenerative joint changes of lumbar spine.     Electronically Signed   By: Craig Farr M.D.   On: 05/24/2023 10:23   She reports that she has never smoked. She has never used smokeless tobacco.  Recent Labs    03/22/24 0814  HGBA1C 5.3    Objective:  VS:  HT:    WT:   BMI:     BP:   HR: bpm  TEMP: ( )  RESP:  Physical Exam Vitals and nursing note reviewed.  HENT:     Head: Normocephalic and atraumatic.     Right Ear: External ear normal.     Left Ear: External ear normal.     Nose: Nose normal.     Mouth/Throat:     Mouth: Mucous membranes are moist.  Eyes:     Extraocular Movements: Extraocular movements intact.  Cardiovascular:     Rate and Rhythm: Normal rate.     Pulses: Normal pulses.  Pulmonary:     Effort: Pulmonary effort is normal.  Abdominal:     General: Abdomen is flat. There is no distension.  Musculoskeletal:        General: Tenderness present.     Cervical back: Normal range of motion.     Comments: Obvious scoliotic deformity to the right noted upon exam. No tenderness noted upon palpation of spinous processes. No step off deformity. No pain noted with flexion, extension, lateral bending and rotation. Good strength noted to bilateral upper extremities.     Skin:    General: Skin is warm and dry.     Capillary Refill: Capillary refill takes less than 2 seconds.  Neurological:     General: No focal deficit present.     Mental Status: She is alert and oriented to person, place, and time.  Psychiatric:        Mood and Affect: Mood normal.        Behavior: Behavior normal.     Ortho Exam  Imaging: No results found.  Past Medical/Family/Surgical/Social History: Medications & Allergies reviewed per EMR, new medications updated. Patient Active Problem List   Diagnosis Date Noted   Colitis 06/03/2024   Hypokalemia 06/03/2024   Elevated serum creatinine 03/27/2024    Neuromuscular disorder (HCC)    Attention deficit disorder 09/27/2023   Prediabetes 09/27/2023   DDD (degenerative disc disease), lumbar 03/27/2023   Class 1 obesity due to excess calories without serious comorbidity in adult 11/29/2022   Chronic thoracic back pain 09/22/2022   Neuroleptic-induced tardive dyskinesia 07/20/2022   Intertrigo 05/19/2022   Encounter for general adult medical examination with abnormal findings 03/22/2022   Arthritis of knee 08/17/2021   Polyp of colon 02/04/2021   Seasonal allergies 10/07/2020   Osteopenia 10/07/2020   Gait disturbance 10/07/2020   Hyperlipidemia 06/11/2020   Sleep apnea 06/11/2020   Stricture and stenosis of esophagus 05/16/2012   Hiatal hernia 05/16/2012   Dysphagia 05/15/2012   Depression    Bipolar disorder (HCC)    GERD (gastroesophageal reflux disease) 09/14/2010   History of colonic polyps 09/14/2010   Past Medical History:  Diagnosis Date   ADD (attention deficit  disorder)    Allergy    Seasonal   Anemia    History of GI blood loss   Anxiety    Arthritis    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    Esophageal stricture    Fracture of superior pubic ramus (HCC) 11/28/2018   GERD (gastroesophageal reflux disease)    Headache(784.0)    Hyperlipidemia    Interstitial cystitis    Joint pain    Lactose intolerance    Lung cancer (HCC) 2002   Neuromuscular disorder (HCC)    Obesity    Osteoarthritis    Osteoporosis    Palpitations    Sleep apnea    Doesn't use a CPAP   Suicidal ideation 01/20/2020   Swallowing difficulty    Tardive dyskinesia    Family History  Problem Relation Age of Onset   Arthritis Mother    Hearing loss Mother    Hyperlipidemia Mother    Hypertension Mother    Depression Mother    Anxiety disorder Mother    Obesity Mother    Sudden  death Mother    Hypertension Father    Diabetes Mellitus II Father    Heart disease Father    Arthritis Father    Cancer Father        Brain   COPD Father    Diabetes Father    Hyperlipidemia Father    Sleep apnea Father    Early death Sister        Aneroxia/Bulimic   Depression Brother    Early death Hydrograph[employer] Surveyor Accident   Stroke Maternal Grandmother    Hypertension Maternal Grandmother    Arthritis Maternal Grandfather    Heart attack Maternal Grandfather    Hearing loss Maternal Grandfather    Depression Daughter    Drug abuse Daughter    Heart disease Daughter    Hypertension Daughter    Colon cancer Neg Hx    Esophageal cancer Neg Hx    Rectal cancer Neg Hx    Colon polyps Neg Hx    Stomach cancer Neg Hx    Past Surgical History:  Procedure Laterality Date   BALLOON DILATION  05/16/2012   Procedure: BALLOON DILATION;  Surgeon: Lamar JONETTA Aho, MD;  Location: [facility] ENDOSCOPY;  Service: Endoscopy;  Laterality: N/A;   BUNIONECTOMY  2011   COLONOSCOPY     ENTEROSCOPY  05/16/2012   Procedure: ENTEROSCOPY;  Surgeon: Lamar JONETTA Aho, MD;  Location: [facility] ENDOSCOPY;  Service: Endoscopy;  Laterality: N/A;   JOINT REPLACEMENT     right shoulder durgery 25 yrs ago  1988   TOTAL HIP ARTHROPLASTY Bilateral 2006, 2008   bilateral   TUBAL LIGATION  1990   WEDGE RESECTION  2002   lung cancer   Social History   Occupational History   Occupation: retired  Tobacco Use   Smoking status: Never   Smokeless tobacco: Never  Vaping Use   Vaping status: Never Used  Substance and Sexual Activity   Alcohol use: Not Currently    Alcohol/week: 1.0 standard drink of alcohol    Types: 1 Glasses of wine per week    Comment: 1 glass wine occassionally   Drug use: No   Sexual activity: Yes

## 2024-08-19 NOTE — Progress Notes (Unsigned)
 Pain Scale   Average Pain 3 Patient her for MRI review, C/O chronic lower back pain        +Driver, -BT, -Dye Allergies.

## 2024-08-20 ENCOUNTER — Telehealth: Payer: Self-pay
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
## 2024-08-20 NOTE — Telephone Encounter (Signed)
 Of course I can't be sure if the recent worsening of confusion is related to increaseing pramipexole  or not but to be safe, cut the dose to 1/2 tablet twice daily. If not better in 24 hours, go to see PCP to make sure she doesn't have a UTI or some other medical cause. Of course if anything else gets worse go to ER.

## 2024-08-20 NOTE — Telephone Encounter (Signed)
 Informed pt of decrease in Pramipexole  and to see her PCP if symptoms not improved in 24 hours.

## 2024-08-22 ENCOUNTER — Other Ambulatory Visit (HOSPITAL_COMMUNITY): Payer: Self-pay

## 2024-08-22 ENCOUNTER — Ambulatory Visit: Admitting: Psychiatry

## 2024-08-22 NOTE — Telephone Encounter (Signed)
 Sent pt message to check how she has been feeling.

## 2024-08-23 NOTE — Telephone Encounter (Signed)
 She reports feeling better and that she is clearer.

## 2024-08-27 ENCOUNTER — Ambulatory Visit (INDEPENDENT_AMBULATORY_CARE_PROVIDER_SITE_OTHER): Admitting: Psychiatry

## 2024-09-02 ENCOUNTER — Other Ambulatory Visit (HOSPITAL_COMMUNITY): Payer: Self-pay

## 2024-09-02 ENCOUNTER — Ambulatory Visit: Admitting: Psychiatry

## 2024-09-02 DIAGNOSIS — F3132 Bipolar disorder, current episode depressed, moderate: Secondary | ICD-10-CM

## 2024-09-02 NOTE — Progress Notes (Signed)
 "       [facility] Counselor/Therapist Progress Note  Patient ID: Megan Whitney, MRN: [id number],    Date: 09/02/2024  Time Spent: 50 minutes   Treatment Type: Individual Therapy  Virtual Visit via Telehealth Note: MyChart Video session Connected with patient by a telemedicine/telehealth application, with their informed consent, and verified patient privacy and that I am speaking with the correct person using two identifiers. I discussed the limitations, risks, security and privacy concerns of performing psychotherapy and the availability of in person appointments. I also discussed with the patient that there may be a patient responsible charge related to this service. The patient expressed understanding and agreed to proceed. I discussed the treatment planning with the patient. The patient was provided an opportunity to ask questions and all were answered. The patient agreed with the plan and demonstrated an understanding of the instructions. The patient was advised to call  our office if  symptoms worsen or feel they are in a crisis state and need immediate contact.   Therapist Location: office Patient Location: home    Reported Symptoms: depression, anxiety, less flying off the handle, agitation with husband, anger with husband   Mental Status Exam:  Appearance:   Casual     Behavior:  Appropriate, Sharing, Motivated, and some agitation  Motor:  Normal  Speech/Language:   Clear and Coherent  Affect:  Depressed and anxiety  Mood:  anxious and depressed  Thought process:  goal directed  Thought content:    Rumination  Sensory/Perceptual disturbances:    WNL  Orientation:  oriented to person, place, time/date, situation, day of week, month of year, year, and stated date of Dec. 29, 2025  Attention:  Good  Concentration:  Fair  Memory:  WNL  Fund of knowledge:   Good  Insight:    Good and Fair  Judgment:   Good and Fair  Impulse Control:  Good and Fair   Risk  Assessment: Danger to Self:  No Self-injurious Behavior: No Danger to Others: No Duty to Warn:no Physical Aggression / Violence:No  Access to Firearms a concern: No  Gang Involvement:No   Subjective:  Patient today reports symptoms of anxiety, some anger, irritability, bipolar , depression, and anger. Denies any SI. Husband got out of hospital recently and has gradually done some better. Today states she is needing to talk through her stress, issues with husband who has cancer, as well as some issues within family re: housing and eventual changes that could happen as they think through options for future per patient. Husband still making poor choices, some times he is not coherent and other times he is better, and patient trying to make difficult decisions. Talked through multiple stressors today in our telehealth session and and also receiving support for some anticipatory grief regarding her husband's cancer.  Pointed out some of the strength that she is showing and trying to make good decisions.  Less anger.  Is maintaining contact with friends which is helpful for her.  Encouraged her continued reaching out to friends as needed and taking the time she needs to think through some of the decisions alluded to above.  Reports staying on her medication and showing some strength and confidence even though she tends to doubt herself at times, and does recognize how she has managed some of her stressors better more recently.    Interventions: Cognitive Behavioral Therapy, Solution-Oriented/Positive Psychology, and Ego-Supportive Long Term Goal: Reduce overall level, frequency, and intensity of the anxiety so that  daily functioning is not impaired. Short Term Goal: 1.Increase understanding of the beliefs and messages that produce the worry and anxiety. Strategies: 1.Help client develop reality-based positive cognitive messages/self-talk. 2. Develop a coping card or other reminder which coping  strategies are recorded for patient's later use    Diagnosis:   ICD-10-CM   1. Bipolar disorder with moderate depression (HCC)  F31.32      Plan: Patient today motivated and continuing to work on her depression, anxiety, some anger, irritability, and frustration.  Some of her frustration is regarding husband's going against medical advice, and she feels helpless to change that.  Feels that her on recent medication change has been helpful and her mood.  Noticeably less tearfulness today in session and shows times of feeling more confident although frustrated also.  Continue to encourage patient in her work on improved self-care, setting healthier limits with others as needed, letting go of guilt, letting others be supportive of her, using good judgment, making decisions and thinking them through rather than being impulsive, refraining from self sabotage, seeing her strengths and her positives more quickly, engage frequently with her 2 dogs that are very therapeutic for her, walking some daily, and recognize the strength she shows as she works with goal-directed behaviors to move in a direction that supports her overall physical/emotional health and her outlook into the future.  Patient does recognize some progress and is working hard to hold onto that progress which she has already made and build up on it.  Feels that this progress has helped her and more of her interacting and communicating with husband and in the times when she is more alone.  Extended family members are staying in contact with patient and husband.  Goal review and progress/challenges noted with patient.  Next appointment within 2 weeks.   Barnie Bunde, LCSW                   "

## 2024-09-10 ENCOUNTER — Ambulatory Visit: Admitting: Psychiatry

## 2024-09-12 ENCOUNTER — Telehealth: Admitting: Psychiatry

## 2024-09-12 ENCOUNTER — Encounter: Payer: Self-pay | Admitting: Psychiatry

## 2024-09-12 DIAGNOSIS — R251 Tremor, unspecified: Secondary | ICD-10-CM

## 2024-09-12 DIAGNOSIS — G4700 Insomnia, unspecified: Secondary | ICD-10-CM | POA: Diagnosis not present

## 2024-09-12 DIAGNOSIS — F3132 Bipolar disorder, current episode depressed, moderate: Secondary | ICD-10-CM

## 2024-09-12 DIAGNOSIS — F419 Anxiety disorder, unspecified: Secondary | ICD-10-CM | POA: Diagnosis not present

## 2024-09-12 DIAGNOSIS — F411 Generalized anxiety disorder: Secondary | ICD-10-CM

## 2024-09-12 DIAGNOSIS — Z79899 Other long term (current) drug therapy: Secondary | ICD-10-CM | POA: Diagnosis not present

## 2024-09-12 DIAGNOSIS — F900 Attention-deficit hyperactivity disorder, predominantly inattentive type: Secondary | ICD-10-CM

## 2024-09-12 DIAGNOSIS — G2401 Drug induced subacute dyskinesia: Secondary | ICD-10-CM

## 2024-09-12 DIAGNOSIS — F5105 Insomnia due to other mental disorder: Secondary | ICD-10-CM

## 2024-09-12 DIAGNOSIS — G3184 Mild cognitive impairment, so stated: Secondary | ICD-10-CM

## 2024-09-12 MED ORDER — PRAMIPEXOLE DIHYDROCHLORIDE ER 1.5 MG PO TB24: 1.0000 | ORAL_TABLET | Freq: Every evening | ORAL | 0 refills | Status: DC
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
## 2024-09-16 ENCOUNTER — Ambulatory Visit: Admitting: Psychiatry

## 2024-09-16 ENCOUNTER — Telehealth: Payer: Self-pay | Admitting: Psychiatry

## 2024-09-16 DIAGNOSIS — F3132 Bipolar disorder, current episode depressed, moderate: Secondary | ICD-10-CM | POA: Diagnosis not present

## 2024-09-16 NOTE — Telephone Encounter (Signed)
 Pending with [employer]  Taking off label not sure PA will approve

## 2024-09-16 NOTE — Telephone Encounter (Signed)
 Rich at [facility] pharmacy lvm needing a PA for Pramipexole  1.5mg / PH: [phone]

## 2024-09-16 NOTE — Progress Notes (Signed)
 "       [facility] Counselor/Therapist Progress Note  Patient ID: Megan Whitney, MRN: [id number],    Date: 09/16/2024  Time Spent: 55 minutes   Treatment Type: Individual Therapy  Reported Symptoms: depression, anxiety, flying off the handle, agitation with husband who has cancer, anger with husband, relationship issues between patient and husband   Mental Status Exam:  Appearance:   Casual     Behavior:  Appropriate, Sharing, and Motivated  Motor:  Normal  Speech/Language:   Clear and Coherent  Affect:  Depressed and anxiety  Mood:  anxious and depressed  Thought process:  goal directed  Thought content:    Rumination  Sensory/Perceptual disturbances:    WNL  Orientation:  oriented to person, place, time/date, situation, day of week, month of year, year, and stated date of Jan. 12, 2025  Attention:  Good/Fair  Concentration:  Fair  Memory:  WNL  Fund of knowledge:   Good and Fair  Insight:    Good and Fair  Judgment:   Good and Fair  Impulse Control:  Good and Fair   Risk Assessment: Danger to Self:  No Self-injurious Behavior: No Danger to Others: No Duty to Warn:no Physical Aggression / Violence:No  Access to Firearms a concern: No  Gang Involvement:No   Subjective:   Patient today in session and reports anxiety, irritability, depression, anger, frustration, regarding husband's illness and some personal issues within family/extended family.  Denies any SI.  Patient very emotional today and needing most of session to talk through her fears and anxieties and frustrations, partly regarding family, and partly regarding some decisions.  (Not all details included in this note due to patient privacy needs.) patient was much calmer by end of session and talk through her need to set boundaries with others and be able to state her needs rather than having to say what they would want me to say.  Less anger overall.  More fear, but that seemed better by end of session.  States  that she is taking her medication as prescribed and feels that that is helping her.  Some self-doubt and feeling overwhelmed by others within family at times and work today on being able to express herself more clearly and directly as needed particularly in the family environment.   Interventions: Cognitive Behavioral Therapy, Solution-Oriented/Positive Psychology, and Ego-Supportive Long Term Goal: Reduce overall level, frequency, and intensity of the anxiety so that daily functioning is not impaired. Short Term Goal: 1.Increase understanding of the beliefs and messages that produce the worry and anxiety. Strategies: 1.Help client develop reality-based positive cognitive messages/self-talk. 2. Develop a coping card or other reminder which coping strategies are recorded for patient's later use.    Diagnosis:   ICD-10-CM   1. Bipolar disorder with moderate depression (HCC)  F31.32      Plan:  Patient working well in session mostly regarding her issues about her husband's health and his decline and some family related issues where patient is needing to set some limits and did not feel that she could.  Working today on her depression, anxiety, anger, frustration, irritability, and aloneness at times.  Does have several good friends however that are checking in on her.  Patient showing some good effort and motivation in her work today but hard to stand up for myself with family. Therapist encouraging patient and her work on setting healthier limits with others as needed, letting go of guilt, working more on some anger, improved self-care, letting others be  supportive of her, using good judgment, making decisions and thinking them through rather than being impulsive, refraining from self sabotage, seeing her strengths and her positives more quickly, engage frequently with her 2 dogs that are very therapeutic for her, walking some to me today, and recognizing the strengths she shows as she works with  goal-directed behaviors to move in a direction that supports her overall physical/emotional health and her outlook into the future.  Megan Whitney does recognize some of the progress that she has made and continues to work on trying to manage daily stressors and move forward.  Does feel good about some progress she has made and recognizes that it helps her in interacting at times and trying to communicate with husband more effectively.  She reports that her extended family members are remaining in contact with she and husband which is helpful.  Goal review and progress/challenges noted with patient.  Next appointment within 2 weeks.   Barnie Bunde, LCSW                   "

## 2024-09-17 NOTE — Telephone Encounter (Signed)
 Received an initial DENIAL for her Pramipexole  ER 1.5 mg with [employer] due to not taking for FDA indication. Will resubmit with the 2 articles related to depression to see if that will overturn their decision.

## 2024-09-23 ENCOUNTER — Ambulatory Visit: Admitting: Psychiatry

## 2024-09-23 DIAGNOSIS — F3132 Bipolar disorder, current episode depressed, moderate: Secondary | ICD-10-CM
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
## 2024-09-24 ENCOUNTER — Ambulatory Visit: Admitting: Psychiatry

## 2024-09-24 ENCOUNTER — Telehealth: Payer: Self-pay

## 2024-09-24 ENCOUNTER — Other Ambulatory Visit: Payer: Self-pay | Admitting: Psychiatry

## 2024-09-24 MED ORDER — PRAMIPEXOLE DIHYDROCHLORIDE 0.5 MG PO TABS: 0.5000 mg | ORAL_TABLET | Freq: Three times a day (TID) | ORAL | 0 refills | Status: AC

## 2024-09-24 NOTE — Telephone Encounter (Signed)
 PA denied twice for Pramipexole  ER 1.5 mg with [employer] advantage Medicare. FDA approved diagnosis per medicare is Parkinson's Disease.   Pt is aware and asks that the Pramipexole  IR Rx be sent to [facility].

## 2024-09-25 ENCOUNTER — Ambulatory Visit: Payer: Self-pay | Admitting: Nurse Practitioner

## 2024-09-27 ENCOUNTER — Other Ambulatory Visit (HOSPITAL_COMMUNITY): Payer: Self-pay

## 2024-09-27 LAB — CBC WITH DIFFERENTIAL/PLATELET
Basophils Absolute: 0.1 x10E3/uL (ref 0.0–0.2)
Basos: 1 %
EOS (ABSOLUTE): 0.1 x10E3/uL (ref 0.0–0.4)
Eos: 1 %
Hematocrit: 40.2 % (ref 34.0–46.6)
Hemoglobin: 13.3 g/dL (ref 11.1–15.9)
Immature Grans (Abs): 0.1 x10E3/uL (ref 0.0–0.1)
Immature Granulocytes: 1 %
Lymphocytes Absolute: 2.5 x10E3/uL (ref 0.7–3.1)
Lymphs: 27 %
MCH: 30.3 pg (ref 26.6–33.0)
MCHC: 33.1 g/dL (ref 31.5–35.7)
MCV: 92 fL (ref 79–97)
Monocytes Absolute: 1 x10E3/uL — ABNORMAL HIGH (ref 0.1–0.9)
Monocytes: 11 %
Neutrophils Absolute: 5.4 x10E3/uL (ref 1.4–7.0)
Neutrophils: 59 %
Platelets: 233 x10E3/uL (ref 150–450)
RBC: 4.39 x10E6/uL (ref 3.77–5.28)
RDW: 12.1 % (ref 11.7–15.4)
WBC: 9 x10E3/uL (ref 3.4–10.8)

## 2024-09-27 LAB — BASIC METABOLIC PANEL WITH GFR
BUN/Creatinine Ratio: 19 (ref 12–28)
BUN: 16 mg/dL (ref 8–27)
CO2: 25 mmol/L (ref 20–29)
Calcium: 9.9 mg/dL (ref 8.7–10.3)
Chloride: 102 mmol/L (ref 96–106)
Creatinine, Ser: 0.85 mg/dL (ref 0.57–1.00)
Glucose: 77 mg/dL (ref 70–99)
Potassium: 4 mmol/L (ref 3.5–5.2)
Sodium: 144 mmol/L (ref 134–144)
eGFR: 74 mL/min/1.73

## 2024-09-27 NOTE — Telephone Encounter (Signed)
 See other phone notes.

## 2024-09-30 ENCOUNTER — Ambulatory Visit: Admitting: Psychiatry

## 2024-09-30 DIAGNOSIS — F3132 Bipolar disorder, current episode depressed, moderate: Secondary | ICD-10-CM | POA: Diagnosis not present
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
## 2024-10-01 ENCOUNTER — Other Ambulatory Visit: Payer: Self-pay | Admitting: Psychiatry

## 2024-10-01 ENCOUNTER — Ambulatory Visit: Admitting: Internal Medicine

## 2024-10-08 ENCOUNTER — Ambulatory Visit: Admitting: Psychiatry

## 2024-10-10 ENCOUNTER — Encounter: Payer: Self-pay | Admitting: Internal Medicine

## 2024-10-10 ENCOUNTER — Telehealth: Payer: Self-pay | Admitting: Internal Medicine

## 2024-10-10 ENCOUNTER — Telehealth: Payer: Self-pay | Admitting: Psychiatry

## 2024-10-10 VITALS — Ht 60.0 in | Wt 118.0 lb

## 2024-10-10 DIAGNOSIS — E782 Mixed hyperlipidemia: Secondary | ICD-10-CM

## 2024-10-10 DIAGNOSIS — K219 Gastro-esophageal reflux disease without esophagitis: Secondary | ICD-10-CM

## 2024-10-10 DIAGNOSIS — F3132 Bipolar disorder, current episode depressed, moderate: Secondary | ICD-10-CM

## 2024-10-10 DIAGNOSIS — M810 Age-related osteoporosis without current pathological fracture: Secondary | ICD-10-CM | POA: Insufficient documentation

## 2024-10-10 DIAGNOSIS — F319 Bipolar disorder, unspecified: Secondary | ICD-10-CM

## 2024-10-10 DIAGNOSIS — F411 Generalized anxiety disorder: Secondary | ICD-10-CM

## 2024-10-10 MED ORDER — VILAZODONE HCL 40 MG PO TABS: 40.0000 mg | ORAL_TABLET | Freq: Every day | ORAL | 0 refills | Status: AC

## 2024-10-10 MED ORDER — ALENDRONATE SODIUM 70 MG PO TABS: 70.0000 mg | ORAL_TABLET | ORAL | 3 refills | Status: AC

## 2024-10-10 MED ORDER — PANTOPRAZOLE SODIUM 40 MG PO TBEC: 40.0000 mg | DELAYED_RELEASE_TABLET | Freq: Every day | ORAL | 3 refills | Status: AC

## 2024-10-10 MED ORDER — VILAZODONE HCL 20 MG PO TABS: ORAL_TABLET | ORAL | 0 refills | Status: AC

## 2024-10-10 NOTE — Telephone Encounter (Signed)
 Sent both doses to [facility].

## 2024-10-10 NOTE — Assessment & Plan Note (Addendum)
 Last DEXA scan showed osteoporosis Continue calcium  and vitamin D  supplement Started alendronate , advised to take it with 2 glasses of water and remain upright for at least 30 minutes after taking the medicine

## 2024-10-10 NOTE — Progress Notes (Signed)
 "    Virtual Visit via Video Note   Because of Megan Whitney co-morbid illnesses, she is at least at moderate risk for complications without adequate follow up.  This format is felt to be most appropriate for this patient at this time.  All issues noted in this document were discussed and addressed.  A limited physical exam was performed with this format.      Evaluation Performed:  Follow-up visit  Date:  10/10/2024   ID:  Megan Whitney, Megan Whitney [birth year], MRN [id number]  Patient Location: Home Provider Location: Office/Clinic  Participants: Patient Location of Patient: Home Location of Provider: Telehealth Consent was obtain for visit to be over via telehealth. I verified that I am speaking with the correct person using two identifiers.  PCP:  Tobie Suzzane POUR, MD   Chief Complaint: Follow up of her chronic medical conditions  History of Present Illness:    Megan Whitney is a 69 y.o. female with PMH of bipolar disorder, ADD, anxiety, OSA, GERD, HLD and osteopenia who has a video visit for follow up of her chronic medical conditions.  Bipolar disorder: She is on multiple medications for bipolar disorder and ADD. She follows up with Psychiatrist and has a therapist, who she visits every week.  GERD and hiatal hernia: She takes Protonix  once daily for it.  She was referred to general surgeon by her GI provider for hiatal hernia. She currently denies any nausea or vomiting, but has chronic acid reflux and epigastric pain.  Denies any melena or hematochezia currently.  Osteoporosis: Her last DEXA scan showed osteoporosis in forearms. She is currently taking calcium  and Vitamin D  supplements.  The patient does not have symptoms concerning for COVID-19 infection (fever, chills, cough, or new shortness of breath).   Past Medical, Surgical, Social History, Allergies, and Medications have been Reviewed.  Past Medical History:  Diagnosis Date   ADD (attention deficit disorder)     Allergy    Seasonal   Anemia    History of GI blood loss   Anxiety    Arthritis    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    Esophageal stricture    Fracture of superior pubic ramus (HCC) 11/28/2018   GERD (gastroesophageal reflux disease)    Headache(784.0)    Hyperlipidemia    Interstitial cystitis    Joint pain    Lactose intolerance    Lung cancer (HCC) 2002   Neuromuscular disorder (HCC)    Obesity    Osteoarthritis    Osteoporosis    Palpitations    Sleep apnea    Doesn't use a CPAP   Suicidal ideation 01/20/2020   Swallowing difficulty    Tardive dyskinesia    Past Surgical History:  Procedure Laterality Date   BALLOON DILATION  05/16/2012   Procedure: BALLOON DILATION;  Surgeon: Lamar JONETTA Aho, MD;  [facility] ENDOSCOPY;  Service: Endoscopy;  Laterality: N/A;   BUNIONECTOMY  2011   COLONOSCOPY     ENTEROSCOPY  05/16/2012   Procedure: ENTEROSCOPY;  Surgeon: Lamar JONETTA Aho, MD;  Location: [facility] ENDOSCOPY;  Service: Endoscopy;  Laterality: N/A;   JOINT REPLACEMENT     right shoulder durgery 25 yrs ago  1988   TOTAL HIP ARTHROPLASTY Bilateral 2006, 2008  bilateral   TUBAL LIGATION  1990   WEDGE RESECTION  2002   lung cancer     Active Medications[1]   Allergies:   Azithromycin, Penicillins, and Adhesive [tape]   ROS:   Please see the history of present illness. All other systems reviewed and are negative.   Labs/Other Tests and Data Reviewed:    Recent Labs: 03/22/2024: TSH 1.370 06/02/2024: ALT 21 09/26/2024: BUN 16; Creatinine, Ser 0.85; Hemoglobin 13.3; Platelets 233; Potassium 4.0; Sodium 144   Recent Lipid Panel Lab Results  Component Value Date/Time   CHOL 197 03/22/2024 08:14 AM   TRIG 74 03/22/2024 08:14 AM   HDL 92 03/22/2024 08:14 AM   CHOLHDL 2.1 03/22/2024 08:14 AM   CHOLHDL 3  07/22/2020 01:38 PM   LDLCALC 92 03/22/2024 08:14 AM    Wt Readings from Last 3 Encounters:  10/10/24 118 lb (53.5 kg)  07/16/24 123 lb (55.8 kg)  06/02/24 115 lb (52.2 kg)     Objective:    Vital Signs:  Ht 5' (1.524 m)   Wt 118 lb (53.5 kg)   BMI 23.05 kg/m    VITAL SIGNS:  reviewed GEN:  no acute distress EYES:  sclerae anicteric, EOMI - Extraocular Movements Intact RESPIRATORY:  normal respiratory effort, symmetric expansion NEURO:  alert and oriented x 3, no obvious focal deficit PSYCH:  normal affect  ASSESSMENT & PLAN:    Assessment & Plan Age-related osteoporosis without current pathological fracture Last DEXA scan showed osteoporosis Continue calcium  and vitamin D  supplement Started alendronate , advised to take it with 2 glasses of water and remain upright for at least 30 minutes after taking the medicine Gastroesophageal reflux disease without esophagitis Usually well-controlled, on Pantoprazole  40 mg QD, refilled Advised to avoid hot or spicy food Try to be sitting upright at least for 30 minutes after meal Bipolar affective disorder, remission status unspecified (HCC) On Viibryd , Lamictal , Austedo , clozapine  Follows up with Psychiatry CBC reviewed, cell counts WNL currently Mixed hyperlipidemia On Atorvastatin  20 mg QD Lipid profile reviewed    I discussed the assessment and treatment plan with the patient. The patient was provided an opportunity to ask questions, and all were answered. The patient agreed with the plan and demonstrated an understanding of the instructions.   The patient was advised to call back or seek an in-person evaluation if the symptoms worsen or if the condition fails to improve as anticipated.  The above assessment and management plan was discussed with the patient. The patient verbalized understanding of and has agreed to the management plan.   Medication Adjustments/Labs and Tests Ordered: Current medicines are reviewed at  length with the patient today.  Concerns regarding medicines are outlined above.   Tests Ordered: No orders of the defined types were placed in this encounter.   Medication Changes: Meds ordered this encounter  Medications   pantoprazole  (PROTONIX ) 40 MG tablet    Sig: Take 1 tablet (40 mg total) by mouth daily.    Dispense:  90 tablet    Refill:  3   alendronate  (FOSAMAX ) 70 MG tablet    Sig: Take 1 tablet (70 mg total) by mouth every 7 (seven) days. Take with a full glass of water on an empty stomach.    Dispense:  12 tablet    Refill:  3     Note: This dictation was prepared with Dragon dictation along with smaller phrase technology. Similar sounding words can be transcribed inadequately or may not be corrected upon review.  Any transcriptional errors that result from this process are unintentional.      Disposition:  Follow up  Signed, Suzzane MARLA Blanch, MD  10/10/2024 11:33 PM     Megan Whitney     [1]  Current Meds  Medication Sig   acetaminophen  (TYLENOL ) 650 MG CR tablet Take 1,300 mg by mouth as needed for pain.   alendronate  (FOSAMAX ) 70 MG tablet Take 1 tablet (70 mg total) by mouth every 7 (seven) days. Take with a full glass of water on an empty stomach.   atorvastatin  (LIPITOR) 20 MG tablet TAKE 1 TABLET EACH EVENING.   cloZAPine  (CLOZARIL ) 25 MG tablet Take 3 tablets (75 mg total) by mouth at bedtime.   Deutetrabenazine  ER (AUSTEDO  XR) 48 MG TB24 TAKE 1 TABLET BY MOUTH EVERY DAY   Ferrous Gluconate  (IRON  27 PO) Take by mouth.   lamoTRIgine  (LAMICTAL ) 100 MG tablet Take 1 tablet (100 mg) in the am and take 2 tablets (200 mg) in the pm   Multiple Vitamin (MULTIVITAMIN ADULT PO) Take by mouth.   pramipexole  (MIRAPEX ) 0.5 MG tablet Take 1 tablet (0.5 mg total) by mouth 3 (three) times daily.   senna (SENOKOT) 8.6 MG tablet Take 1 tablet by mouth daily.   [DISCONTINUED] FLUZONE HIGH-DOSE 0.5 ML injection Inject 0.5 mLs into the  muscle once.   [DISCONTINUED] pantoprazole  (PROTONIX ) 40 MG tablet Take 1 tablet (40 mg total) by mouth 2 (two) times daily. Office visit for further refills   [DISCONTINUED] Vilazodone  HCl (VIIBRYD ) 40 MG TABS Take 1 tablet (40 mg total) by mouth daily.   [DISCONTINUED] Vilazodone  HCl 20 MG TABS Take 1 tablet (20 mg) by mouth daily along with a 40 mg tablet.   "

## 2024-10-10 NOTE — Telephone Encounter (Signed)
 Pt requests RF on two doses she takes of vilazodone , 40mg  20mg . Please send to [facility] - [place], [place] - [phone]

## 2024-10-10 NOTE — Assessment & Plan Note (Addendum)
 On Viibryd , Lamictal , Austedo , clozapine  Follows up with Psychiatry CBC reviewed, cell counts WNL currently

## 2024-10-10 NOTE — Patient Instructions (Addendum)
 Please start taking Alendronate  once weekly. Please take 2 glasses of water with it and remain upright for at least 30 minutes after taking it.  Please continue taking Caltrate 600 + D3 twice daily.  Please continue to take medications as prescribed.  Please continue to follow low carb diet and perform moderate exercise/walking at least 150 mins/week.

## 2024-10-10 NOTE — Assessment & Plan Note (Addendum)
 Usually well-controlled, on Pantoprazole  40 mg QD, refilled Advised to avoid hot or spicy food Try to be sitting upright at least for 30 minutes after meal

## 2024-10-10 NOTE — Assessment & Plan Note (Addendum)
 On Atorvastatin  20 mg QD Lipid profile reviewed

## 2024-10-14 ENCOUNTER — Telehealth: Admitting: Psychiatry

## 2024-10-15 ENCOUNTER — Ambulatory Visit: Admitting: Psychiatry

## 2024-10-22 ENCOUNTER — Ambulatory Visit: Admitting: Psychiatry

## 2024-10-28 ENCOUNTER — Ambulatory Visit: Admitting: Psychiatry

## 2024-11-11 ENCOUNTER — Ambulatory Visit: Admitting: Psychiatry

## 2024-11-12 ENCOUNTER — Ambulatory Visit: Admitting: Psychiatry

## 2024-11-25 ENCOUNTER — Ambulatory Visit: Admitting: Psychiatry

## 2024-12-09 ENCOUNTER — Ambulatory Visit: Admitting: Psychiatry

## 2024-12-10 ENCOUNTER — Ambulatory Visit: Admitting: Psychiatry

## 2024-12-23 ENCOUNTER — Ambulatory Visit: Admitting: Psychiatry

## 2025-01-09 ENCOUNTER — Ambulatory Visit: Admitting: Psychiatry

## 2025-07-18 ENCOUNTER — Ambulatory Visit
